# Patient Record
Sex: Female | Born: 1950 | Race: Black or African American | Hispanic: No | Smoking: Former smoker
Health system: Southern US, Community
[De-identification: ages and names within clinical notes are randomized; demographics above are authoritative.]

## PROBLEM LIST (undated history)

## (undated) DIAGNOSIS — I219 Acute myocardial infarction, unspecified: Secondary | ICD-10-CM

## (undated) DIAGNOSIS — G4730 Sleep apnea, unspecified: Secondary | ICD-10-CM

## (undated) DIAGNOSIS — H409 Unspecified glaucoma: Secondary | ICD-10-CM

## (undated) DIAGNOSIS — I2510 Atherosclerotic heart disease of native coronary artery without angina pectoris: Secondary | ICD-10-CM

## (undated) DIAGNOSIS — R12 Heartburn: Secondary | ICD-10-CM

## (undated) DIAGNOSIS — I252 Old myocardial infarction: Secondary | ICD-10-CM

## (undated) DIAGNOSIS — G4733 Obstructive sleep apnea (adult) (pediatric): Secondary | ICD-10-CM

## (undated) DIAGNOSIS — E785 Hyperlipidemia, unspecified: Secondary | ICD-10-CM

## (undated) DIAGNOSIS — R110 Nausea: Secondary | ICD-10-CM

## (undated) DIAGNOSIS — M549 Dorsalgia, unspecified: Secondary | ICD-10-CM

## (undated) DIAGNOSIS — K5900 Constipation, unspecified: Secondary | ICD-10-CM

## (undated) DIAGNOSIS — M109 Gout, unspecified: Secondary | ICD-10-CM

## (undated) DIAGNOSIS — E669 Obesity, unspecified: Secondary | ICD-10-CM

## (undated) DIAGNOSIS — E1169 Type 2 diabetes mellitus with other specified complication: Secondary | ICD-10-CM

## (undated) DIAGNOSIS — R609 Edema, unspecified: Secondary | ICD-10-CM

## (undated) DIAGNOSIS — I639 Cerebral infarction, unspecified: Secondary | ICD-10-CM

## (undated) DIAGNOSIS — R0602 Shortness of breath: Secondary | ICD-10-CM

## (undated) DIAGNOSIS — I5022 Chronic systolic (congestive) heart failure: Secondary | ICD-10-CM

## (undated) DIAGNOSIS — R45851 Suicidal ideations: Secondary | ICD-10-CM

## (undated) DIAGNOSIS — R0600 Dyspnea, unspecified: Secondary | ICD-10-CM

## (undated) DIAGNOSIS — F32A Depression, unspecified: Secondary | ICD-10-CM

## (undated) DIAGNOSIS — I447 Left bundle-branch block, unspecified: Secondary | ICD-10-CM

## (undated) DIAGNOSIS — Z95810 Presence of automatic (implantable) cardiac defibrillator: Secondary | ICD-10-CM

## (undated) DIAGNOSIS — I519 Heart disease, unspecified: Secondary | ICD-10-CM

## (undated) DIAGNOSIS — R079 Chest pain, unspecified: Secondary | ICD-10-CM

## (undated) DIAGNOSIS — D649 Anemia, unspecified: Secondary | ICD-10-CM

## (undated) DIAGNOSIS — M2550 Pain in unspecified joint: Secondary | ICD-10-CM

## (undated) DIAGNOSIS — R7303 Prediabetes: Secondary | ICD-10-CM

## (undated) DIAGNOSIS — U071 COVID-19: Secondary | ICD-10-CM

## (undated) DIAGNOSIS — I255 Ischemic cardiomyopathy: Secondary | ICD-10-CM

## (undated) DIAGNOSIS — F419 Anxiety disorder, unspecified: Secondary | ICD-10-CM

## (undated) DIAGNOSIS — R42 Dizziness and giddiness: Secondary | ICD-10-CM

## (undated) DIAGNOSIS — F329 Major depressive disorder, single episode, unspecified: Secondary | ICD-10-CM

## (undated) DIAGNOSIS — I10 Essential (primary) hypertension: Secondary | ICD-10-CM

## (undated) HISTORY — DX: Anxiety disorder, unspecified: F41.9

## (undated) HISTORY — PX: UTERINE FIBROID SURGERY: SHX826

## (undated) HISTORY — DX: Type 2 diabetes mellitus with other specified complication: E11.69

## (undated) HISTORY — DX: Ischemic cardiomyopathy: I25.5

## (undated) HISTORY — DX: Old myocardial infarction: I25.2

## (undated) HISTORY — DX: Dizziness and giddiness: R42

## (undated) HISTORY — DX: Gout, unspecified: M10.9

## (undated) HISTORY — DX: Edema, unspecified: R60.9

## (undated) HISTORY — DX: Left bundle-branch block, unspecified: I44.7

## (undated) HISTORY — DX: Unspecified glaucoma: H40.9

## (undated) HISTORY — DX: Chest pain, unspecified: R07.9

## (undated) HISTORY — DX: Shortness of breath: R06.02

## (undated) HISTORY — DX: Hyperlipidemia, unspecified: E78.5

## (undated) HISTORY — DX: Dorsalgia, unspecified: M54.9

## (undated) HISTORY — DX: Obstructive sleep apnea (adult) (pediatric): G47.33

## (undated) HISTORY — DX: Anemia, unspecified: D64.9

## (undated) HISTORY — DX: Constipation, unspecified: K59.00

## (undated) HISTORY — DX: Sleep apnea, unspecified: G47.30

## (undated) HISTORY — DX: Heartburn: R12

## (undated) HISTORY — DX: Type 2 diabetes mellitus with other specified complication: E66.9

## (undated) HISTORY — DX: Depression, unspecified: F32.A

## (undated) HISTORY — PX: TONSILLECTOMY: SUR1361

## (undated) HISTORY — PX: APPENDECTOMY: SHX54

## (undated) HISTORY — DX: Essential (primary) hypertension: I10

## (undated) HISTORY — DX: Pain in unspecified joint: M25.50

## (undated) HISTORY — DX: Heart disease, unspecified: I51.9

## (undated) HISTORY — DX: Major depressive disorder, single episode, unspecified: F32.9

## (undated) HISTORY — DX: Nausea: R11.0

## (undated) HISTORY — DX: Atherosclerotic heart disease of native coronary artery without angina pectoris: I25.10

## (undated) HISTORY — DX: Chronic systolic (congestive) heart failure: I50.22

## (undated) HISTORY — PX: SMALL BOWEL REPAIR: SHX6447

---
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
## 2008-04-27 DIAGNOSIS — D509 Iron deficiency anemia, unspecified: Secondary | ICD-10-CM

## 2008-04-27 DIAGNOSIS — K635 Polyp of colon: Secondary | ICD-10-CM | POA: Insufficient documentation

## 2008-04-27 DIAGNOSIS — Z9889 Other specified postprocedural states: Secondary | ICD-10-CM

## 2008-04-27 DIAGNOSIS — M8580 Other specified disorders of bone density and structure, unspecified site: Secondary | ICD-10-CM

## 2008-04-27 DIAGNOSIS — E7800 Pure hypercholesterolemia, unspecified: Secondary | ICD-10-CM

## 2008-04-27 DIAGNOSIS — D259 Leiomyoma of uterus, unspecified: Secondary | ICD-10-CM

## 2008-04-27 DIAGNOSIS — H409 Unspecified glaucoma: Secondary | ICD-10-CM | POA: Insufficient documentation

## 2008-04-27 DIAGNOSIS — I10 Essential (primary) hypertension: Secondary | ICD-10-CM

## 2008-04-27 DIAGNOSIS — E785 Hyperlipidemia, unspecified: Secondary | ICD-10-CM

## 2008-04-27 DIAGNOSIS — D126 Benign neoplasm of colon, unspecified: Secondary | ICD-10-CM

## 2008-04-27 DIAGNOSIS — F411 Generalized anxiety disorder: Secondary | ICD-10-CM | POA: Insufficient documentation

## 2008-04-27 DIAGNOSIS — N3289 Other specified disorders of bladder: Secondary | ICD-10-CM | POA: Insufficient documentation

## 2008-04-27 HISTORY — DX: Hyperlipidemia, unspecified: E78.5

## 2008-04-27 HISTORY — DX: Iron deficiency anemia, unspecified: D50.9

## 2008-04-27 HISTORY — DX: Generalized anxiety disorder: F41.1

## 2008-04-27 HISTORY — DX: Other specified postprocedural states: Z98.890

## 2008-04-27 HISTORY — DX: Leiomyoma of uterus, unspecified: D25.9

## 2008-04-27 HISTORY — DX: Pure hypercholesterolemia, unspecified: E78.00

## 2008-04-27 HISTORY — DX: Benign neoplasm of colon, unspecified: D12.6

## 2008-04-27 HISTORY — DX: Other specified disorders of bone density and structure, unspecified site: M85.80

## 2008-04-27 HISTORY — DX: Essential (primary) hypertension: I10

## 2009-06-16 DIAGNOSIS — M26609 Unspecified temporomandibular joint disorder, unspecified side: Secondary | ICD-10-CM

## 2009-06-16 HISTORY — DX: Unspecified temporomandibular joint disorder, unspecified side: M26.609

## 2010-09-15 DIAGNOSIS — E559 Vitamin D deficiency, unspecified: Secondary | ICD-10-CM

## 2010-09-15 HISTORY — DX: Vitamin D deficiency, unspecified: E55.9

## 2013-11-11 DIAGNOSIS — F331 Major depressive disorder, recurrent, moderate: Secondary | ICD-10-CM | POA: Diagnosis not present

## 2013-11-16 DIAGNOSIS — D649 Anemia, unspecified: Secondary | ICD-10-CM | POA: Diagnosis not present

## 2013-11-16 DIAGNOSIS — H9190 Unspecified hearing loss, unspecified ear: Secondary | ICD-10-CM | POA: Diagnosis not present

## 2013-11-16 DIAGNOSIS — F329 Major depressive disorder, single episode, unspecified: Secondary | ICD-10-CM | POA: Diagnosis not present

## 2013-11-16 DIAGNOSIS — F3289 Other specified depressive episodes: Secondary | ICD-10-CM | POA: Diagnosis not present

## 2013-11-16 DIAGNOSIS — I10 Essential (primary) hypertension: Secondary | ICD-10-CM | POA: Diagnosis not present

## 2013-12-29 DIAGNOSIS — L639 Alopecia areata, unspecified: Secondary | ICD-10-CM | POA: Diagnosis not present

## 2014-02-04 DIAGNOSIS — L639 Alopecia areata, unspecified: Secondary | ICD-10-CM | POA: Diagnosis not present

## 2014-03-03 DIAGNOSIS — F410 Panic disorder [episodic paroxysmal anxiety] without agoraphobia: Secondary | ICD-10-CM | POA: Diagnosis not present

## 2014-03-12 DIAGNOSIS — L639 Alopecia areata, unspecified: Secondary | ICD-10-CM | POA: Diagnosis not present

## 2014-04-02 DIAGNOSIS — F331 Major depressive disorder, recurrent, moderate: Secondary | ICD-10-CM | POA: Diagnosis not present

## 2014-04-02 DIAGNOSIS — F410 Panic disorder [episodic paroxysmal anxiety] without agoraphobia: Secondary | ICD-10-CM | POA: Diagnosis not present

## 2014-04-02 DIAGNOSIS — F429 Obsessive-compulsive disorder, unspecified: Secondary | ICD-10-CM | POA: Diagnosis not present

## 2014-04-02 DIAGNOSIS — F411 Generalized anxiety disorder: Secondary | ICD-10-CM | POA: Diagnosis not present

## 2014-04-09 DIAGNOSIS — L639 Alopecia areata, unspecified: Secondary | ICD-10-CM | POA: Diagnosis not present

## 2014-05-25 DIAGNOSIS — I10 Essential (primary) hypertension: Secondary | ICD-10-CM | POA: Diagnosis not present

## 2014-06-30 DIAGNOSIS — H4010X0 Unspecified open-angle glaucoma, stage unspecified: Secondary | ICD-10-CM | POA: Diagnosis not present

## 2014-07-01 DIAGNOSIS — F411 Generalized anxiety disorder: Secondary | ICD-10-CM | POA: Diagnosis not present

## 2014-07-01 DIAGNOSIS — F410 Panic disorder [episodic paroxysmal anxiety] without agoraphobia: Secondary | ICD-10-CM | POA: Diagnosis not present

## 2014-07-01 DIAGNOSIS — F429 Obsessive-compulsive disorder, unspecified: Secondary | ICD-10-CM | POA: Diagnosis not present

## 2014-07-01 DIAGNOSIS — F331 Major depressive disorder, recurrent, moderate: Secondary | ICD-10-CM | POA: Diagnosis not present

## 2014-10-13 DIAGNOSIS — Z23 Encounter for immunization: Secondary | ICD-10-CM | POA: Diagnosis not present

## 2014-10-20 DIAGNOSIS — F410 Panic disorder [episodic paroxysmal anxiety] without agoraphobia: Secondary | ICD-10-CM | POA: Diagnosis not present

## 2014-10-20 DIAGNOSIS — F42 Obsessive-compulsive disorder: Secondary | ICD-10-CM | POA: Diagnosis not present

## 2014-10-20 DIAGNOSIS — F331 Major depressive disorder, recurrent, moderate: Secondary | ICD-10-CM | POA: Diagnosis not present

## 2014-11-24 DIAGNOSIS — H40009 Preglaucoma, unspecified, unspecified eye: Secondary | ICD-10-CM | POA: Diagnosis not present

## 2014-12-02 DIAGNOSIS — H40013 Open angle with borderline findings, low risk, bilateral: Secondary | ICD-10-CM | POA: Diagnosis not present

## 2015-01-05 DIAGNOSIS — F331 Major depressive disorder, recurrent, moderate: Secondary | ICD-10-CM | POA: Diagnosis not present

## 2015-01-05 DIAGNOSIS — F42 Obsessive-compulsive disorder: Secondary | ICD-10-CM | POA: Diagnosis not present

## 2015-03-29 DIAGNOSIS — H40053 Ocular hypertension, bilateral: Secondary | ICD-10-CM | POA: Diagnosis not present

## 2015-04-06 ENCOUNTER — Encounter: Payer: Self-pay | Admitting: Licensed Clinical Social Worker

## 2015-04-07 ENCOUNTER — Ambulatory Visit: Payer: Self-pay | Admitting: Psychiatry

## 2015-04-14 ENCOUNTER — Ambulatory Visit (INDEPENDENT_AMBULATORY_CARE_PROVIDER_SITE_OTHER): Payer: Medicare Other | Admitting: Psychiatry

## 2015-04-14 ENCOUNTER — Other Ambulatory Visit: Payer: Self-pay

## 2015-04-14 ENCOUNTER — Encounter: Payer: Self-pay | Admitting: Psychiatry

## 2015-04-14 VITALS — BP 126/86 | HR 83 | Temp 98.9°F | Ht 63.0 in

## 2015-04-14 DIAGNOSIS — F429 Obsessive-compulsive disorder, unspecified: Secondary | ICD-10-CM

## 2015-04-14 DIAGNOSIS — F331 Major depressive disorder, recurrent, moderate: Secondary | ICD-10-CM

## 2015-04-14 DIAGNOSIS — F42 Obsessive-compulsive disorder: Secondary | ICD-10-CM

## 2015-04-14 MED ORDER — TRAZODONE HCL 100 MG PO TABS: 100.0000 mg | ORAL_TABLET | Freq: Every evening | ORAL | Status: DC | PRN

## 2015-04-14 MED ORDER — LORAZEPAM 0.5 MG PO TABS: 0.5000 mg | ORAL_TABLET | Freq: Four times a day (QID) | ORAL | Status: DC | PRN

## 2015-04-14 MED ORDER — VENLAFAXINE HCL ER 150 MG PO CP24: 150.0000 mg | ORAL_CAPSULE | Freq: Every day | ORAL | Status: DC

## 2015-04-14 NOTE — Progress Notes (Addendum)
BH MD/PA/NP OP Progress Note  04/14/2015 3:52 PM Melanie Ray  MRN:  [id number]  Subjective:  Patient returns for follow-up of her OCD and major depressive disorder. She states that she continues to feel stable although she does have some instances of her OCD and Tums. For example she states she continues to at times be preoccupied with contamination and engages in handwashing. However she states it is not risen to the level of interfering with her ability to enjoy life or other activities. She does state that her obsessions about contamination death. She discusses that some of the death of her sister-in-law are stirred up from her brothers new significant others. She indicated that she is going to pass on going to a concert with her brother and his new significant other because it triggers her thoughts about the funeral for her sister-in-law (i.e. Brothers late wife).   He stated that she has several activities she looks forward to such as going to a nephew's graduation in [place]. She also states that on Father's Day she will spend time with her husband and father.  She feels medications continue to be effective and denies any side effects. Chief Complaint:  Chief Complaint    Anxiety; Depression     Visit Diagnosis:     ICD-9-CM ICD-10-CM   1. OCD (obsessive compulsive disorder) 300.3 F42 LORazepam (ATIVAN) 0.5 MG tablet  2. Major depressive disorder, recurrent episode, moderate 296.32 F33.1 traZODone (DESYREL) 100 MG tablet     venlafaxine XR (EFFEXOR-XR) 150 MG 24 hr capsule    Past Medical History:  Past Medical History  Diagnosis Date  . Anxiety   . Depression   . Hypertension   . Hyperlipemia     Past Surgical History  Procedure Laterality Date  . Small bowel repair    . Appendectomy    . Tonsillectomy     Family History:  Family History  Problem Relation Age of Onset  . Anxiety disorder Mother   . Paranoid behavior Mother   . Hypertension Mother   . Hypertension  Father   . Anxiety disorder Maternal Aunt   . Drug abuse Cousin    Social History:  History   Social History  . Marital Status: Married    Spouse Name: N/A  . Number of Children: N/A  . Years of Education: N/A   Social History Main Topics  . Smoking status: Former Smoker    Types: Cigarettes    Quit date: 04/13/1997  . Smokeless tobacco: Never Used  . Alcohol Use: 0.6 oz/week    0 Standard drinks or equivalent, 1 Glasses of wine per week  . Drug Use: No  . Sexual Activity: No   Other Topics Concern  . None   Social History Narrative   Additional History:   Assessment:   Musculoskeletal: Strength & Muscle Tone: within normal limits Gait & Station: normal Patient leans: N/A  Psychiatric Specialty Exam: HPI  Review of Systems  Psychiatric/Behavioral: Negative for depression, suicidal ideas, hallucinations, memory loss and substance abuse. The patient is not nervous/anxious and does not have insomnia.     Blood pressure 126/86, pulse 83, temperature 98.9 F (37.2 C), temperature source Tympanic, height 5\' 3"  (1.6 m), SpO2 95 %.There is no weight on file to calculate BMI.  General Appearance: Neat and Well Groomed  Eye Contact:  Good  Speech:  Clear and Coherent and Normal Rate  Volume:  Normal  Mood:  Good  Affect:  Congruent  Thought Process:  Linear and Logical  Orientation:  Full (Time, Place, and Person)  Thought Content:  Negative  Suicidal Thoughts:  No  Homicidal Thoughts:  No  Memory:  Immediate;   Good Recent;   Good Remote;   Good  Judgement:  Good  Insight:  Good  Psychomotor Activity:  Negative  Concentration:  Good  Recall:  Good  Fund of Knowledge: Good  Language: Good  Akathisia:  Negative  Handed:  Right unknown  AIMS (if indicated):  Not done  Assets:  Communication Skills Desire for Improvement Leisure Time  ADL's:  Intact  Cognition: WNL  Sleep:  Good   Is the patient at risk to self?  No. Has the patient been a risk to self  in the past 6 months?  No. Has the patient been a risk to self within the distant past?  No. Is the patient a risk to others?  No. Has the patient been a risk to others in the past 6 months?  No. Has the patient been a risk to others within the distant past?  No.  Current Medications: Current Outpatient Prescriptions  Medication Sig Dispense Refill  . dorzolamide-timolol (COSOPT) 22.3-6.8 MG/ML ophthalmic solution     . lisinopril (PRINIVIL,ZESTRIL) 20 MG tablet     . LORazepam (ATIVAN) 0.5 MG tablet Take 1 tablet (0.5 mg total) by mouth 4 (four) times daily as needed for anxiety. 120 tablet 4  . MULTIPLE VITAMIN PO     . traZODone (DESYREL) 100 MG tablet Take 1 tablet (100 mg total) by mouth at bedtime as needed for sleep. 30 tablet 3  . venlafaxine XR (EFFEXOR-XR) 150 MG 24 hr capsule Take 1 capsule (150 mg total) by mouth daily. 30 capsule 3   No current facility-administered medications for this visit.    Medical Decision Making:  Established Problem, Stable/Improving (1)  Treatment Plan Summary:Medication management We will continue her medications without any changes. She will continue Effexor XR 150 mg daily, trazodone 100 mg at bedtime as needed and Ativan 0.5 mg 4 times a day as needed for anxiety. Patient will follow up in 3 months. She's been encouraged: Questions or concerns prior to her next appointment.  During appointment patient had asked about primary care physicians. I indicated I did not know specifics about any particular primary care physician. I spoke with another psychiatrist to has worked in the area for a while. I passed onto patient on 04/18/2015 that there were the [facility] and with our [facility]. I also indicated I was given the names of a Dr. Darrick Meigs and Dr. Venia Minks. I told patient I did not have first experience with them but I was basing this on another doctor who was familiar with groups and doctors in the area. Patient accepted information and  was thankful.  Faith Rogue 04/14/2015, 3:52 PM

## 2015-05-10 ENCOUNTER — Other Ambulatory Visit: Payer: Self-pay

## 2015-05-10 DIAGNOSIS — F429 Obsessive-compulsive disorder, unspecified: Secondary | ICD-10-CM

## 2015-05-10 NOTE — Telephone Encounter (Signed)
This is a dr. Gwyndolyn Saxon pt.  On the last visit dr.williams increased medication from 3 times a day to 4 times a day but the  insurance will not cover for the ativan 4 times a day.    pt was rx sent for ativan 3 times a day.

## 2015-05-10 NOTE — Telephone Encounter (Signed)
pt called back states that she has enought medication to do until dr. Jimmye Norman comes back from vacation next week

## 2015-05-16 MED ORDER — LORAZEPAM 0.5 MG PO TABS: 0.5000 mg | ORAL_TABLET | Freq: Three times a day (TID) | ORAL | Status: DC

## 2015-05-16 NOTE — Telephone Encounter (Signed)
rx faxed to [place] notified patient.

## 2015-05-16 NOTE — Telephone Encounter (Signed)
rx faxed to [facility].  

## 2015-06-27 DIAGNOSIS — D499 Neoplasm of unspecified behavior of unspecified site: Secondary | ICD-10-CM | POA: Diagnosis not present

## 2015-06-27 DIAGNOSIS — B079 Viral wart, unspecified: Secondary | ICD-10-CM | POA: Diagnosis not present

## 2015-06-27 DIAGNOSIS — B078 Other viral warts: Secondary | ICD-10-CM | POA: Diagnosis not present

## 2015-07-05 DIAGNOSIS — H40003 Preglaucoma, unspecified, bilateral: Secondary | ICD-10-CM | POA: Diagnosis not present

## 2015-07-15 ENCOUNTER — Ambulatory Visit: Payer: Medicare Other | Admitting: Psychiatry

## 2015-07-28 ENCOUNTER — Ambulatory Visit (INDEPENDENT_AMBULATORY_CARE_PROVIDER_SITE_OTHER): Payer: Medicare Other | Admitting: Family Medicine

## 2015-07-28 ENCOUNTER — Telehealth: Payer: Self-pay | Admitting: *Deleted

## 2015-07-28 ENCOUNTER — Encounter: Payer: Self-pay | Admitting: Family Medicine

## 2015-07-28 ENCOUNTER — Encounter (INDEPENDENT_AMBULATORY_CARE_PROVIDER_SITE_OTHER): Payer: Self-pay

## 2015-07-28 VITALS — BP 130/84 | HR 82 | Temp 98.5°F | Ht 63.25 in | Wt 183.5 lb

## 2015-07-28 DIAGNOSIS — Z23 Encounter for immunization: Secondary | ICD-10-CM

## 2015-07-28 DIAGNOSIS — G4700 Insomnia, unspecified: Secondary | ICD-10-CM

## 2015-07-28 DIAGNOSIS — R42 Dizziness and giddiness: Secondary | ICD-10-CM | POA: Diagnosis not present

## 2015-07-28 DIAGNOSIS — Z833 Family history of diabetes mellitus: Secondary | ICD-10-CM

## 2015-07-28 DIAGNOSIS — F329 Major depressive disorder, single episode, unspecified: Secondary | ICD-10-CM | POA: Insufficient documentation

## 2015-07-28 DIAGNOSIS — F419 Anxiety disorder, unspecified: Secondary | ICD-10-CM | POA: Insufficient documentation

## 2015-07-28 DIAGNOSIS — E785 Hyperlipidemia, unspecified: Secondary | ICD-10-CM | POA: Diagnosis not present

## 2015-07-28 DIAGNOSIS — E663 Overweight: Secondary | ICD-10-CM | POA: Insufficient documentation

## 2015-07-28 DIAGNOSIS — E669 Obesity, unspecified: Secondary | ICD-10-CM | POA: Diagnosis not present

## 2015-07-28 DIAGNOSIS — I10 Essential (primary) hypertension: Secondary | ICD-10-CM | POA: Diagnosis not present

## 2015-07-28 DIAGNOSIS — H409 Unspecified glaucoma: Secondary | ICD-10-CM

## 2015-07-28 DIAGNOSIS — F32A Depression, unspecified: Secondary | ICD-10-CM | POA: Insufficient documentation

## 2015-07-28 HISTORY — DX: Hyperlipidemia, unspecified: E78.5

## 2015-07-28 HISTORY — DX: Insomnia, unspecified: G47.00

## 2015-07-28 HISTORY — DX: Major depressive disorder, single episode, unspecified: F32.9

## 2015-07-28 LAB — COMPREHENSIVE METABOLIC PANEL
ALBUMIN: 4.3 g/dL (ref 3.5–5.2)
ALK PHOS: 115 U/L (ref 39–117)
ALT: 22 U/L (ref 0–35)
AST: 20 U/L (ref 0–37)
BUN: 21 mg/dL (ref 6–23)
CO2: 29 mEq/L (ref 19–32)
CREATININE: 0.88 mg/dL (ref 0.40–1.20)
Calcium: 10.3 mg/dL (ref 8.4–10.5)
Chloride: 103 mEq/L (ref 96–112)
GFR: 82.87 mL/min (ref >60.00)
GLUCOSE: 85 mg/dL (ref 70–99)
Potassium: 4.4 mEq/L (ref 3.5–5.1)
SODIUM: 138 meq/L (ref 135–145)
TOTAL PROTEIN: 8 g/dL (ref 6.0–8.3)
Total Bilirubin: 0.3 mg/dL (ref 0.2–1.2)

## 2015-07-28 LAB — LIPID PANEL
CHOL/HDL RATIO: 7
Cholesterol: 246 mg/dL — ABNORMAL HIGH (ref 0–200)
HDL: 37.6 mg/dL — ABNORMAL LOW (ref >39.00)
LDL Cholesterol: 173 mg/dL — ABNORMAL HIGH (ref 0–99)
NONHDL: 207.94
Triglycerides: 177 mg/dL — ABNORMAL HIGH (ref 0.0–149.0)
VLDL: 35.4 mg/dL (ref 0.0–40.0)

## 2015-07-28 LAB — CBC
HCT: 40.3 % (ref 36.0–46.0)
Hemoglobin: 13.2 g/dL (ref 12.0–15.0)
MCHC: 32.8 g/dL (ref 30.0–36.0)
MCV: 81.4 fl (ref 78.0–100.0)
Platelets: 390 10*3/uL (ref 150.0–400.0)
RBC: 4.95 Mil/uL (ref 3.87–5.11)
RDW: 13.9 % (ref 11.5–15.5)
WBC: 8.7 10*3/uL (ref 4.0–10.5)

## 2015-07-28 LAB — HEMOGLOBIN A1C: HEMOGLOBIN A1C: 6.8 % — AB (ref 4.6–6.5)

## 2015-07-28 NOTE — Telephone Encounter (Signed)
Pt called states she failed to request Lisinopril Rx during her NP appoint on 9.21.16.  Please advise

## 2015-07-28 NOTE — Progress Notes (Signed)
Pre visit review using our clinic review tool, if applicable. No additional management support is needed unless otherwise documented below in the visit note. 

## 2015-07-28 NOTE — Assessment & Plan Note (Signed)
Stable

## 2015-07-28 NOTE — Assessment & Plan Note (Signed)
Unclear of control. Will obtain records. Additionally, will obtain lipid panel today.

## 2015-07-28 NOTE — Assessment & Plan Note (Signed)
Unclear etiology with broad differential. Does not appear to be cardiac in nature but this is always a possibility. Offered EKG but patient declined due to insurance issues. Obtaining lab work today: CBC, CMP, A1c. Orthostatics negative. Will continue to follow closely.

## 2015-07-28 NOTE — Assessment & Plan Note (Signed)
Well controlled. Continue ACEI. Metabolic panel today.

## 2015-07-28 NOTE — Assessment & Plan Note (Signed)
Stable. Continue prn trazodone 

## 2015-07-28 NOTE — Assessment & Plan Note (Signed)
Stable. Will continue Effexor.

## 2015-07-28 NOTE — Patient Instructions (Signed)
It was nice to see you today.  Follow up with me in the next 2-3 months.  We will call with your lab results.   Take care  Dr. Lacinda Axon

## 2015-07-28 NOTE — Assessment & Plan Note (Signed)
Well-controlled. Continue PRN Ativan.

## 2015-07-28 NOTE — Progress Notes (Signed)
Subjective:  Patient ID: Melanie Ray, female    DOB: 1951/05/30  Age: 64 y.o. MRN: [id number]  CC: Establish care, dizziness  HPI Melanie Ray is a 64 y.o. female presents to the clinic today to establish care.  She has complaints of dizziness.  Dizziness  Has been going on for 1-1/2 years.  She states that in the mornings between 9 and 12 she typically has an episode of dizziness. She states it is characterized by feeling faint and is associated with blurry vision and nausea. She states that she does feel like she is going to pass out.  She reports that last anywhere from 5-20 minutes. She states that it typically improves following PO intake (food, water).   No known exacerbating factors.  She does not have chest pain or palpitations with these. She does note that she has some shortness of breath with it.  She states that it appears to be different than her underlying anxiety.  No association with exertion.  HTN  Well controlled on Lisinopril.  In need of labs.  Depression  Well controlled on Effexor.  Anxiety  Stable. Requires minimal use of Ativan when anxious.  Glaucoma  Followed by [facility].  Stable at this time.  Insomnia  Doing well currently.  Uses intermittent Trazodone.  Preventative Healthcare  Pap smear: No prior positives; Last pap smear was ~ 5 years ago.  Mammogram: Unsure of last mammogram.  Colonoscopy: >10 years ago.  Immunizations: Declines Tdap, Pneumovax, Zostavax. Would like a flu shot today.  Labs: In need of CBC, CMP, Lipid, A1C  Alcohol use: See below.  Smoking/tobacco use: Former smoker.  HIV testing: declines.   PMH, Surgical Hx, Family Hx, Social History reviewed and updated as below. Past Medical History  Diagnosis Date  . Anxiety   . Depression   . Hypertension   . Hyperlipemia   . Glaucoma    Past Surgical History  Procedure Laterality Date  . Small bowel repair    . Appendectomy    .  Tonsillectomy     Family History  Problem Relation Age of Onset  . Anxiety disorder Mother   . Paranoid behavior Mother   . Hypertension Mother   . Hypertension Father   . Stroke Father   . Anxiety disorder Maternal Aunt   . Drug abuse Cousin   . Tuberculosis Paternal Grandfather    Social History  Substance Use Topics  . Smoking status: Former Smoker    Types: Cigarettes    Quit date: 04/13/1997  . Smokeless tobacco: Never Used  . Alcohol Use: 0.6 oz/week    1 Glasses of wine, 0 Standard drinks or equivalent per week   Review of Systems  Respiratory: Positive for cough.   Cardiovascular: Positive for leg swelling.  Genitourinary: Positive for frequency.       Incontinence.  Skin:       Wart removed recently.  Neurological: Positive for dizziness and light-headedness.       Memory difficulties.  All other systems negative.  Objective:   Today's Vitals: BP 130/84 mmHg  Pulse 82  Temp(Src) 98.5 F (36.9 C) (Oral)  Ht 5' 3.25" (1.607 m)  Wt 183 lb 8 oz (83.235 kg)  BMI 32.23 kg/m2  SpO2 93%  Physical Exam  Constitutional: She is oriented to person, place, and time. She appears well-developed and well-nourished. No distress.  HENT:  Head: Normocephalic and atraumatic.  Nose: Nose normal.  Mouth/Throat: Oropharynx is clear and moist. No oropharyngeal  exudate.  Normal TM's bilaterally.   Eyes: Conjunctivae and EOM are normal. Pupils are equal, round, and reactive to light. No scleral icterus.  Neck: Neck supple.  Cardiovascular: Normal rate and regular rhythm.   No murmur heard. Pulmonary/Chest: Effort normal and breath sounds normal. She has no wheezes. She has no rales.  Abdominal: Soft. She exhibits no distension. There is no tenderness. There is no rebound and no guarding.  Musculoskeletal: Normal range of motion. She exhibits no edema.  Lymphadenopathy:    She has no cervical adenopathy.  Neurological: She is alert and oriented to person, place, and time.    Skin: Skin is warm and dry. No rash noted.  Psychiatric: She has a normal mood and affect.  Vitals reviewed.  Assessment & Plan:   Problem List Items Addressed This Visit    Essential hypertension - Primary    Well controlled. Continue ACEI. Metabolic panel today.      Relevant Orders   CBC   Comp Met (CMET)   Hemoglobin A1c   HLD (hyperlipidemia)    Unclear of control. Will obtain records. Additionally, will obtain lipid panel today.      Glaucoma    Stable.      Depression    Stable. Will continue Effexor.       Anxiety    Well-controlled. Continue PRN Ativan.      Insomnia    Stable. Continue prn trazodone.      Obesity (BMI 30.0-34.9)   Relevant Orders   Hemoglobin A1c   Dizziness    Unclear etiology with broad differential. Does not appear to be cardiac in nature but this is always a possibility. Offered EKG but patient declined due to insurance issues. Obtaining lab work today: CBC, CMP, A1c. Orthostatics negative. Will continue to follow closely.      Relevant Orders   CBC   Hemoglobin A1c    Other Visit Diagnoses    Hyperlipidemia        Relevant Orders    Lipid panel    Family history of diabetes mellitus        Relevant Orders    Hemoglobin A1c    Encounter for immunization           Outpatient Encounter Prescriptions as of 07/28/2015  Medication Sig  . APPLE CIDER VINEGAR PO Take by mouth.  . dorzolamide-timolol (COSOPT) 22.3-6.8 MG/ML ophthalmic solution   . lisinopril (PRINIVIL,ZESTRIL) 20 MG tablet   . LORazepam (ATIVAN) 0.5 MG tablet Take 1 tablet (0.5 mg total) by mouth 3 (three) times daily.  . MULTIPLE VITAMIN PO   . traZODone (DESYREL) 100 MG tablet Take 1 tablet (100 mg total) by mouth at bedtime as needed for sleep.  [name] venlafaxine XR (EFFEXOR-XR) 150 MG 24 hr capsule Take 1 capsule (150 mg total) by mouth daily.   No facility-administered encounter medications on file as of 07/28/2015.   Follow-up: 2-3 months.  Coral Spikes DO

## 2015-07-29 ENCOUNTER — Other Ambulatory Visit: Payer: Self-pay | Admitting: Family Medicine

## 2015-07-29 MED ORDER — LISINOPRIL 20 MG PO TABS: 20.0000 mg | ORAL_TABLET | Freq: Every day | ORAL | Status: DC

## 2015-07-29 NOTE — Telephone Encounter (Signed)
Rx sent 

## 2015-07-29 NOTE — Telephone Encounter (Signed)
Tried to call pt to let her know that her rx had been sent.

## 2015-08-03 ENCOUNTER — Ambulatory Visit (INDEPENDENT_AMBULATORY_CARE_PROVIDER_SITE_OTHER): Payer: Medicare Other | Admitting: Family Medicine

## 2015-08-03 ENCOUNTER — Encounter: Payer: Self-pay | Admitting: Family Medicine

## 2015-08-03 VITALS — BP 164/98 | HR 87 | Temp 98.9°F | Ht 63.25 in | Wt 183.5 lb

## 2015-08-03 DIAGNOSIS — E119 Type 2 diabetes mellitus without complications: Secondary | ICD-10-CM | POA: Diagnosis not present

## 2015-08-03 DIAGNOSIS — E785 Hyperlipidemia, unspecified: Secondary | ICD-10-CM | POA: Diagnosis not present

## 2015-08-03 DIAGNOSIS — R7303 Prediabetes: Secondary | ICD-10-CM | POA: Insufficient documentation

## 2015-08-03 HISTORY — DX: Type 2 diabetes mellitus without complications: E11.9

## 2015-08-03 MED ORDER — ATORVASTATIN CALCIUM 40 MG PO TABS: 40.0000 mg | ORAL_TABLET | Freq: Every day | ORAL | Status: DC

## 2015-08-03 MED ORDER — METFORMIN HCL 500 MG PO TABS: 500.0000 mg | ORAL_TABLET | Freq: Two times a day (BID) | ORAL | Status: DC

## 2015-08-03 NOTE — Progress Notes (Signed)
Subjective:  Patient ID: Melanie Ray, female    DOB: 05-13-1951  Age: 64 y.o. MRN: [id number]  CC: Follow up; recently found to be diabetic, lipids uncontrolled.  HPI:  64 year old female with HTN and HLD presents for follow up.  DM-2  Recent labs revealed A1C of 6.8.  Patient presents today to discuss new diagnosis and treatment options.  She is feeling okay today.  No recent polyuria, polydipsia, weight loss.   HLD Lipid Panel     Component Value Date/Time   CHOL 246* 07/28/2015 1007   TRIG 177.0* 07/28/2015 1007   HDL 37.60* 07/28/2015 1007   CHOLHDL 7 07/28/2015 1007   VLDL 35.4 07/28/2015 1007   LDLCALC 173* 07/28/2015 1007    Recent labs are above.  Cholesterol uncontrolled.  ASCVD risk score - 27.4% 10 year risk.  She is on no treatment at this time.  Social Hx   Social History   Social History  . Marital Status: Married    Spouse Name: N/A  . Number of Children: N/A  . Years of Education: N/A   Social History Main Topics  . Smoking status: Former Smoker    Types: Cigarettes    Quit date: 04/13/1997  . Smokeless tobacco: Never Used  . Alcohol Use: 0.6 oz/week    1 Glasses of wine, 0 Standard drinks or equivalent per week  . Drug Use: No  . Sexual Activity: No   Other Topics Concern  . None   Social History Narrative   Review of Systems  Constitutional: Negative.   Respiratory: Positive for cough.   Endocrine: Negative.    Objective:  BP 164/98 mmHg  Pulse 87  Temp(Src) 98.9 F (37.2 C) (Oral)  Ht 5' 3.25" (1.607 m)  Wt 183 lb 8 oz (83.235 kg)  BMI 32.23 kg/m2  SpO2 93%  BP/Weight 08/03/2015 03/09/6978 02/11/164  Systolic BP 537 482 707  Diastolic BP 98 84 86  Wt. (Lbs) 183.5 183.5 -  BMI 32.23 32.23 -   Physical Exam  Constitutional: She is oriented to person, place, and time. She appears well-developed and well-nourished. No distress.  Cardiovascular: Normal rate and regular rhythm.   No murmur heard. Pulmonary/Chest:  Effort normal and breath sounds normal. No respiratory distress. She has no wheezes. She has no rales.  Abdominal: Soft. She exhibits no distension. There is no tenderness.  Neurological: She is alert and oriented to person, place, and time.  Psychiatric: She has a normal mood and affect.  Vitals reviewed.  Lab Results  Component Value Date   WBC 8.7 07/28/2015   HGB 13.2 07/28/2015   HCT 40.3 07/28/2015   PLT 390.0 07/28/2015   GLUCOSE 85 07/28/2015   CHOL 246* 07/28/2015   TRIG 177.0* 07/28/2015   HDL 37.60* 07/28/2015   LDLCALC 173* 07/28/2015   ALT 22 07/28/2015   AST 20 07/28/2015   NA 138 07/28/2015   K 4.4 07/28/2015   CL 103 07/28/2015   CREATININE 0.88 07/28/2015   BUN 21 07/28/2015   CO2 29 07/28/2015   HGBA1C 6.8* 07/28/2015    Assessment & Plan:   Problem List Items Addressed This Visit    HLD (hyperlipidemia) - Primary    Established problem, Uncontrolled. ASCVD risk score (10 year) = 27.4% Starting Lipitor 40 mg daily (high intensity statin). Will plan to recheck at follow up in Dec.      Relevant Medications   atorvastatin (LIPITOR) 40 MG tablet   DM type 2 (diabetes  mellitus, type 2)    New problem. Starting Metformin today. Follow up in December. Will address preventative care then as there is currently issues regarding her insurance/payment.      Relevant Medications   atorvastatin (LIPITOR) 40 MG tablet   metFORMIN (GLUCOPHAGE) 500 MG tablet      Meds ordered this encounter  Medications  . atorvastatin (LIPITOR) 40 MG tablet    Sig: Take 1 tablet (40 mg total) by mouth daily.    Dispense:  90 tablet    Refill:  3  . metFORMIN (GLUCOPHAGE) 500 MG tablet    Sig: Take 1 tablet (500 mg total) by mouth 2 (two) times daily with a meal.    Dispense:  180 tablet    Refill:  3    Follow-up: As scheduled previously (December).  Thersa Salt, DO

## 2015-08-03 NOTE — Patient Instructions (Signed)
It was nice to see you.  Take the Lipitor (Atorvastatin) daily as prescribed.  Take the metformin twice daily.  After 1 week you can increase it to 1000 mg (2 tablets) in the morning and 1 tablet (500 mg) at night.  Follow up in Dec. As scheduled.  Take care  Dr. Lacinda Axon

## 2015-08-03 NOTE — Assessment & Plan Note (Signed)
New problem. Starting Metformin today. Follow up in December. Will address preventative care then as there is currently issues regarding her insurance/payment.

## 2015-08-03 NOTE — Progress Notes (Signed)
Pre visit review using our clinic review tool, if applicable. No additional management support is needed unless otherwise documented below in the visit note. 

## 2015-08-03 NOTE — Assessment & Plan Note (Addendum)
Established problem, Uncontrolled. ASCVD risk score (10 year) = 27.4% Starting Lipitor 40 mg daily (high intensity statin). Will plan to recheck at follow up in Dec.

## 2015-08-05 ENCOUNTER — Ambulatory Visit: Payer: Medicare Other | Admitting: Family Medicine

## 2015-08-08 ENCOUNTER — Telehealth: Payer: Self-pay

## 2015-08-08 ENCOUNTER — Other Ambulatory Visit: Payer: Self-pay | Admitting: Family Medicine

## 2015-08-08 MED ORDER — BENZONATATE 100 MG PO CAPS: 100.0000 mg | ORAL_CAPSULE | Freq: Three times a day (TID) | ORAL | Status: DC | PRN

## 2015-08-08 NOTE — Telephone Encounter (Signed)
I received a phone message from patient. She is wondering how much sugar and carbs she should consume on a daily basis. Patient also wants to know if there is a cough medication that can be prescribed. Patient states that she has tried everything OTC for a cough, and nothing seems to be helping. Please advise.

## 2015-08-08 NOTE — Telephone Encounter (Signed)
She just needs to watch carb intake. No particular number. Tessalon sent in for cough.

## 2015-08-08 NOTE — Telephone Encounter (Signed)
lvtcb with her husband

## 2015-08-26 ENCOUNTER — Other Ambulatory Visit: Payer: Self-pay | Admitting: Psychiatry

## 2015-09-13 ENCOUNTER — Ambulatory Visit (INDEPENDENT_AMBULATORY_CARE_PROVIDER_SITE_OTHER): Payer: Medicare Other | Admitting: Psychiatry

## 2015-09-13 ENCOUNTER — Encounter: Payer: Self-pay | Admitting: Psychiatry

## 2015-09-13 VITALS — BP 136/78 | HR 96 | Temp 97.0°F | Ht 63.0 in | Wt 181.4 lb

## 2015-09-13 DIAGNOSIS — F331 Major depressive disorder, recurrent, moderate: Secondary | ICD-10-CM | POA: Diagnosis not present

## 2015-09-13 DIAGNOSIS — F429 Obsessive-compulsive disorder, unspecified: Secondary | ICD-10-CM

## 2015-09-13 MED ORDER — LORAZEPAM 0.5 MG PO TABS: 0.5000 mg | ORAL_TABLET | Freq: Three times a day (TID) | ORAL | Status: DC

## 2015-09-13 MED ORDER — VENLAFAXINE HCL ER 150 MG PO CP24: 150.0000 mg | ORAL_CAPSULE | Freq: Every day | ORAL | Status: DC

## 2015-09-13 MED ORDER — TRAZODONE HCL 100 MG PO TABS: 100.0000 mg | ORAL_TABLET | Freq: Every evening | ORAL | Status: DC | PRN

## 2015-09-13 NOTE — Progress Notes (Signed)
[place] MD/PA/NP OP Progress Note  09/13/2015 4:52 PM Melanie Ray  MRN:  [id number]  Subjective:  Patient returns for follow-up of her OCD and major depressive disorder. Patient stated overall she feels stable on her medication. Today she discussed perhaps her biggest stressor is caring for her husband who has dementia. She discussed how she feels somewhat like her life is not for filled because he is not able to function like a true affectionate partner. She states otherwise she is active in various activities. She states that sometimes she does have her anxiety and she'll use her Ativan 2 or 3 times a week.  In [place] that since her last visit she was diagnosed with diabetes and she is now taking oral medication for that. She discussed [name] sometimes she wants to eat or carbohydrates and indicated that sometimes she might not follow her regimen and consumes sweets. She indicated that son she will wonders why she is willing to eat sweets even though she knows it's not good for her health. She proposed the connection is that since she is not having a fulfilled relationship such as with her husband she may not be as enthusiastic about life. She adamantly denies it's risen to the level of suicidal ideation but perhaps she describes more of an indifference to her current situation. Chief Complaint: Lack of fulfillment Chief Complaint    Follow-up; Medication Refill     Visit Diagnosis:     ICD-9-CM ICD-10-CM   1. OCD (obsessive compulsive disorder) 300.3 F42.9 LORazepam (ATIVAN) 0.5 MG tablet  2. Major depressive disorder, recurrent episode, moderate (HCC) 296.32 F33.1 traZODone (DESYREL) 100 MG tablet    Past Medical History:  Past Medical History  Diagnosis Date  . Anxiety   . Depression   . Hypertension   . Hyperlipemia   . Glaucoma     Past Surgical History  Procedure Laterality Date  . Small bowel repair    . Appendectomy    . Tonsillectomy     Family History:  Family History   Problem Relation Age of Onset  . Anxiety disorder Mother   . Paranoid behavior Mother   . Hypertension Mother   . Heart failure Mother   . Stroke Mother   . Dementia Mother   . Hypertension Father   . Anxiety disorder Maternal Aunt   . Drug abuse Cousin   . Tuberculosis Paternal Grandfather   . Mood Disorder Sister   . Stroke Sister    Social History:  Social History   Social History  . Marital Status: Married    Spouse Name: N/A  . Number of Children: N/A  . Years of Education: N/A   Social History Main Topics  . Smoking status: Former Smoker    Types: Cigarettes    Quit date: 04/13/1997  . Smokeless tobacco: Never Used  . Alcohol Use: 0.6 oz/week    1 Glasses of wine, 0 Standard drinks or equivalent, 0 Cans of beer, 0 Shots of liquor per week  . Drug Use: No  . Sexual Activity: No   Other Topics Concern  . None   Social History Narrative   Additional History:   Assessment:   Musculoskeletal: Strength & Muscle Tone: within normal limits Gait & Station: normal Patient leans: N/A  Psychiatric Specialty Exam: HPI  Review of Systems  Psychiatric/Behavioral: Negative for depression, suicidal ideas, hallucinations, memory loss and substance abuse. The patient is nervous/anxious. The patient does not have insomnia.   All other systems reviewed and  are negative.   Blood pressure 136/78, pulse 96, temperature 97 F (36.1 C), temperature source Tympanic, height 5\' 3"  (1.6 m), weight 181 lb 6.4 oz (82.283 kg), SpO2 98 %.Body mass index is 32.14 kg/(m^2).  General Appearance: Neat and Well Groomed  Eye Contact:  Good  Speech:  Clear and Coherent and Normal Rate  Volume:  Normal  Mood:  Good  Affect:  Congruent  Thought Process:  Linear and Logical  Orientation:  Full (Time, Place, and Person)  Thought Content:  Negative  Suicidal Thoughts:  No  Homicidal Thoughts:  No  Memory:  Immediate;   Good Recent;   Good Remote;   Good  Judgement:  Good  Insight:   Good  Psychomotor Activity:  Negative  Concentration:  Good  Recall:  Good  Fund of Knowledge: Good  Language: Good  Akathisia:  Negative  Handed:  Right unknown  AIMS (if indicated):  Not done  Assets:  Communication Skills Desire for Improvement Leisure Time  ADL's:  Intact  Cognition: WNL  Sleep:  Good   Is the patient at risk to self?  No. Has the patient been a risk to self in the past 6 months?  No. Has the patient been a risk to self within the distant past?  No. Is the patient a risk to others?  No. Has the patient been a risk to others in the past 6 months?  No. Has the patient been a risk to others within the distant past?  No.  Current Medications: Current Outpatient Prescriptions  Medication Sig Dispense Refill  . APPLE CIDER VINEGAR PO Take by mouth.    [name] atorvastatin (LIPITOR) 40 MG tablet Take 1 tablet (40 mg total) by mouth daily. 90 tablet 3  . dorzolamide-timolol (COSOPT) 22.3-6.8 MG/ML ophthalmic solution     . lisinopril (PRINIVIL,ZESTRIL) 20 MG tablet Take 1 tablet (20 mg total) by mouth daily. 90 tablet 3  . LORazepam (ATIVAN) 0.5 MG tablet Take 1 tablet (0.5 mg total) by mouth 3 (three) times daily. 90 tablet 4  . metFORMIN (GLUCOPHAGE) 500 MG tablet Take 1 tablet (500 mg total) by mouth 2 (two) times daily with a meal. 180 tablet 3  . MULTIPLE VITAMIN PO     . traZODone (DESYREL) 100 MG tablet Take 1 tablet (100 mg total) by mouth at bedtime as needed for sleep. 30 tablet 4  . venlafaxine XR (EFFEXOR-XR) 150 MG 24 hr capsule Take 1 capsule (150 mg total) by mouth daily. 30 capsule 4  . benzonatate (TESSALON) 100 MG capsule Take 1 capsule (100 mg total) by mouth 3 (three) times daily as needed for cough. (Patient not taking: Reported on 09/13/2015) 30 capsule 0   No current facility-administered medications for this visit.    Medical Decision Making:  Established Problem, Stable/Improving (1)  Treatment Plan Summary:Medication management   OCD-We will  continue her medications without any changes. She will continue Effexor XR 150 mg daily, trazodone 100 mg at bedtime as needed and Ativan 0.5 mg 4 times a day as needed for anxiety. Patient will follow up in 3 months. She's been encouraged: Questions or concerns prior to her next appointment.    Faith Rogue 09/13/2015, 4:52 PM

## 2015-10-28 ENCOUNTER — Ambulatory Visit: Payer: Medicare Other | Admitting: Family Medicine

## 2015-12-02 DIAGNOSIS — H40003 Preglaucoma, unspecified, bilateral: Secondary | ICD-10-CM | POA: Diagnosis not present

## 2015-12-16 DIAGNOSIS — H40003 Preglaucoma, unspecified, bilateral: Secondary | ICD-10-CM | POA: Diagnosis not present

## 2015-12-16 LAB — HM DIABETES EYE EXAM

## 2015-12-21 ENCOUNTER — Encounter: Payer: Self-pay | Admitting: Family Medicine

## 2016-01-17 ENCOUNTER — Ambulatory Visit (INDEPENDENT_AMBULATORY_CARE_PROVIDER_SITE_OTHER): Payer: Medicare Other | Admitting: Psychiatry

## 2016-01-17 ENCOUNTER — Encounter: Payer: Self-pay | Admitting: Psychiatry

## 2016-01-17 VITALS — BP 138/82 | HR 98 | Temp 97.1°F | Ht 63.0 in | Wt 178.4 lb

## 2016-01-17 DIAGNOSIS — F331 Major depressive disorder, recurrent, moderate: Secondary | ICD-10-CM | POA: Diagnosis not present

## 2016-01-17 DIAGNOSIS — F429 Obsessive-compulsive disorder, unspecified: Secondary | ICD-10-CM | POA: Diagnosis not present

## 2016-01-17 MED ORDER — LORAZEPAM 0.5 MG PO TABS: 0.5000 mg | ORAL_TABLET | Freq: Two times a day (BID) | ORAL | Status: DC | PRN

## 2016-01-17 MED ORDER — TRAZODONE HCL 100 MG PO TABS: 100.0000 mg | ORAL_TABLET | Freq: Every evening | ORAL | Status: DC | PRN

## 2016-01-17 MED ORDER — VENLAFAXINE HCL ER 150 MG PO CP24: 150.0000 mg | ORAL_CAPSULE | Freq: Every day | ORAL | Status: DC

## 2016-01-17 NOTE — Progress Notes (Signed)
Patient ID: Melanie Ray, female   DOB: Aug 01, 1951, 65 y.o.   MRN: [id number] [facility] MD/PA/NP OP Progress Note  01/17/2016 3:06 PM Rilea Yearby  MRN:  [id number]  Subjective:  Patient returns for follow-up of her OCD and major depressive disorder. She was previously seen by Dr.Williams. This is the first visit for this patient with this clinician. Patient stated overall she feels stable on her medication. She described her severe OCD symptoms that started in 2011, says they are much better now. States most of her thoughts revolved around death and when her sister in law died, she had a lot of difficulty managing her symptoms. Currently reports being overall stable. States that she takes the Ativan about 2 times daily. Discussed with her the risks of excessive sedation , dizziness and memory impairment that increase with age as well. Patient states that she will try to keep it to twice daily. States that she is enjoying her life otherwise.  Chief Complaint: Doing okay Chief Complaint    Follow-up; Medication Refill     Visit Diagnosis:   No diagnosis found.  Past Medical History:  Past Medical History  Diagnosis Date  . Anxiety   . Depression   . Hypertension   . Hyperlipemia   . Glaucoma     Past Surgical History  Procedure Laterality Date  . Small bowel repair    . Appendectomy    . Tonsillectomy     Family History:  Family History  Problem Relation Age of Onset  . Anxiety disorder Mother   . Paranoid behavior Mother   . Hypertension Mother   . Heart failure Mother   . Stroke Mother   . Dementia Mother   . Hypertension Father   . Anxiety disorder Maternal Aunt   . Drug abuse Cousin   . Tuberculosis Paternal Grandfather   . Mood Disorder Sister   . Stroke Sister    Social History:  Social History   Social History  . Marital Status: Married    Spouse Name: N/A  . Number of Children: N/A  . Years of Education: N/A   Social History Main Topics  . Smoking  status: Former Smoker    Types: Cigarettes    Quit date: 04/13/1997  . Smokeless tobacco: Never Used  . Alcohol Use: 0.6 oz/week    1 Glasses of wine, 0 Standard drinks or equivalent, 0 Cans of beer, 0 Shots of liquor per week  . Drug Use: No  . Sexual Activity: No   Other Topics Concern  . None   Social History Narrative   Additional History:   Assessment:   Musculoskeletal: Strength & Muscle Tone: within normal limits Gait & Station: normal Patient leans: N/A  Psychiatric Specialty Exam: HPI  Review of Systems  Psychiatric/Behavioral: Negative for depression, suicidal ideas, hallucinations, memory loss and substance abuse. The patient is nervous/anxious. The patient does not have insomnia.   All other systems reviewed and are negative.   Blood pressure 138/82, pulse 98, temperature 97.1 F (36.2 C), temperature source Tympanic, height 5\' 3"  (1.6 m), weight 178 lb 6.4 oz (80.922 kg), SpO2 95 %.Body mass index is 31.61 kg/(m^2).  General Appearance: Neat and Well Groomed  Eye Contact:  Good  Speech:  Clear and Coherent and Normal Rate  Volume:  Normal  Mood:  Good  Affect:  Congruent  Thought Process:  Linear and Logical  Orientation:  Full (Time, Place, and Person)  Thought Content:  Negative  Suicidal Thoughts:  No  Homicidal Thoughts:  No  Memory:  Immediate;   Good Recent;   Good Remote;   Good  Judgement:  Good  Insight:  Good  Psychomotor Activity:  Negative  Concentration:  Good  Recall:  Good  Fund of Knowledge: Good  Language: Good  Akathisia:  Negative  Handed:  Right unknown  AIMS (if indicated):  Not done  Assets:  Communication Skills Desire for Improvement Leisure Time  ADL's:  Intact  Cognition: WNL  Sleep:  Good   Is the patient at risk to self?  No. Has the patient been a risk to self in the past 6 months?  No. Has the patient been a risk to self within the distant past?  No. Is the patient a risk to others?  No. Has the patient been  a risk to others in the past 6 months?  No. Has the patient been a risk to others within the distant past?  No.  Current Medications: Current Outpatient Prescriptions  Medication Sig Dispense Refill  . APPLE CIDER VINEGAR PO Take by mouth.    . dorzolamide-timolol (COSOPT) 22.3-6.8 MG/ML ophthalmic solution     . lisinopril (PRINIVIL,ZESTRIL) 20 MG tablet Take 1 tablet (20 mg total) by mouth daily. 90 tablet 3  . LORazepam (ATIVAN) 0.5 MG tablet Take 1 tablet (0.5 mg total) by mouth 3 (three) times daily. 90 tablet 4  . metFORMIN (GLUCOPHAGE) 500 MG tablet Take 1 tablet (500 mg total) by mouth 2 (two) times daily with a meal. 180 tablet 3  . MULTIPLE VITAMIN PO     . traZODone (DESYREL) 100 MG tablet Take 1 tablet (100 mg total) by mouth at bedtime as needed for sleep. 30 tablet 4  . venlafaxine XR (EFFEXOR-XR) 150 MG 24 hr capsule Take 1 capsule (150 mg total) by mouth daily. 30 capsule 4   No current facility-administered medications for this visit.    Medical Decision Making:  Established Problem, Stable/Improving (1)  Treatment Plan Summary:Medication management   OCD-Continue Effexor XR 150 mg daily, trazodone 100 mg at bedtime as needed  Decrease Ativan 0.5 mg to 2 times a day as needed for anxiety. Patient will follow up in 2 months. She's been encouraged: Questions or concerns prior to her next appointment. Patient requested a letter exempting her from [employer] duty, given her mental health.    Sander Speckman 01/17/2016, 3:06 PM

## 2016-01-19 DIAGNOSIS — H00022 Hordeolum internum right lower eyelid: Secondary | ICD-10-CM | POA: Diagnosis not present

## 2016-02-01 DIAGNOSIS — H00022 Hordeolum internum right lower eyelid: Secondary | ICD-10-CM | POA: Diagnosis not present

## 2016-03-13 ENCOUNTER — Ambulatory Visit: Payer: Medicare Other | Admitting: Psychiatry

## 2016-03-17 ENCOUNTER — Inpatient Hospital Stay (HOSPITAL_COMMUNITY)
Admission: RE | Admit: 2016-03-17 | Discharge: 2016-03-22 | DRG: 246 | Disposition: A | Discharge status: Home / Self Care | Payer: Medicare Other | Attending: Cardiovascular Disease | Admitting: Cardiovascular Disease

## 2016-03-17 ENCOUNTER — Encounter (HOSPITAL_COMMUNITY): Payer: Self-pay | Admitting: Cardiovascular Disease

## 2016-03-17 ENCOUNTER — Emergency Department: Payer: Medicare Other

## 2016-03-17 ENCOUNTER — Emergency Department
Admission: EM | Admit: 2016-03-17 | Discharge: 2016-03-17 | Disposition: A | Discharge status: Other Acute Inpatient Hospital | Payer: Medicare Other | Attending: Emergency Medicine | Admitting: Emergency Medicine

## 2016-03-17 ENCOUNTER — Encounter: Payer: Self-pay | Admitting: *Deleted

## 2016-03-17 ENCOUNTER — Encounter (HOSPITAL_COMMUNITY)
Admission: RE | Disposition: A | Discharge status: Home / Self Care | Payer: Self-pay | Source: Home / Self Care | Attending: Cardiovascular Disease

## 2016-03-17 DIAGNOSIS — Z886 Allergy status to analgesic agent status: Secondary | ICD-10-CM

## 2016-03-17 DIAGNOSIS — I2109 ST elevation (STEMI) myocardial infarction involving other coronary artery of anterior wall: Principal | ICD-10-CM | POA: Diagnosis present

## 2016-03-17 DIAGNOSIS — I509 Heart failure, unspecified: Secondary | ICD-10-CM

## 2016-03-17 DIAGNOSIS — F418 Other specified anxiety disorders: Secondary | ICD-10-CM | POA: Diagnosis present

## 2016-03-17 DIAGNOSIS — I255 Ischemic cardiomyopathy: Secondary | ICD-10-CM | POA: Diagnosis not present

## 2016-03-17 DIAGNOSIS — I214 Non-ST elevation (NSTEMI) myocardial infarction: Secondary | ICD-10-CM

## 2016-03-17 DIAGNOSIS — Z87891 Personal history of nicotine dependence: Secondary | ICD-10-CM

## 2016-03-17 DIAGNOSIS — R0789 Other chest pain: Secondary | ICD-10-CM | POA: Diagnosis present

## 2016-03-17 DIAGNOSIS — I2510 Atherosclerotic heart disease of native coronary artery without angina pectoris: Secondary | ICD-10-CM | POA: Diagnosis not present

## 2016-03-17 DIAGNOSIS — I252 Old myocardial infarction: Secondary | ICD-10-CM | POA: Diagnosis present

## 2016-03-17 DIAGNOSIS — Z8249 Family history of ischemic heart disease and other diseases of the circulatory system: Secondary | ICD-10-CM

## 2016-03-17 DIAGNOSIS — I2102 ST elevation (STEMI) myocardial infarction involving left anterior descending coronary artery: Secondary | ICD-10-CM | POA: Diagnosis not present

## 2016-03-17 DIAGNOSIS — R079 Chest pain, unspecified: Secondary | ICD-10-CM | POA: Diagnosis not present

## 2016-03-17 DIAGNOSIS — Z885 Allergy status to narcotic agent status: Secondary | ICD-10-CM | POA: Diagnosis not present

## 2016-03-17 DIAGNOSIS — I10 Essential (primary) hypertension: Secondary | ICD-10-CM | POA: Diagnosis present

## 2016-03-17 DIAGNOSIS — Z7984 Long term (current) use of oral hypoglycemic drugs: Secondary | ICD-10-CM | POA: Insufficient documentation

## 2016-03-17 DIAGNOSIS — Z79899 Other long term (current) drug therapy: Secondary | ICD-10-CM | POA: Insufficient documentation

## 2016-03-17 DIAGNOSIS — E785 Hyperlipidemia, unspecified: Secondary | ICD-10-CM | POA: Insufficient documentation

## 2016-03-17 DIAGNOSIS — E119 Type 2 diabetes mellitus without complications: Secondary | ICD-10-CM | POA: Insufficient documentation

## 2016-03-17 DIAGNOSIS — I5022 Chronic systolic (congestive) heart failure: Secondary | ICD-10-CM

## 2016-03-17 DIAGNOSIS — I447 Left bundle-branch block, unspecified: Secondary | ICD-10-CM | POA: Diagnosis not present

## 2016-03-17 DIAGNOSIS — F329 Major depressive disorder, single episode, unspecified: Secondary | ICD-10-CM | POA: Diagnosis not present

## 2016-03-17 DIAGNOSIS — Z23 Encounter for immunization: Secondary | ICD-10-CM | POA: Diagnosis not present

## 2016-03-17 DIAGNOSIS — I249 Acute ischemic heart disease, unspecified: Secondary | ICD-10-CM | POA: Diagnosis not present

## 2016-03-17 DIAGNOSIS — Z881 Allergy status to other antibiotic agents status: Secondary | ICD-10-CM

## 2016-03-17 DIAGNOSIS — I110 Hypertensive heart disease with heart failure: Secondary | ICD-10-CM | POA: Diagnosis present

## 2016-03-17 DIAGNOSIS — I5021 Acute systolic (congestive) heart failure: Secondary | ICD-10-CM | POA: Diagnosis present

## 2016-03-17 DIAGNOSIS — R7303 Prediabetes: Secondary | ICD-10-CM

## 2016-03-17 DIAGNOSIS — Z955 Presence of coronary angioplasty implant and graft: Secondary | ICD-10-CM

## 2016-03-17 DIAGNOSIS — E669 Obesity, unspecified: Secondary | ICD-10-CM | POA: Insufficient documentation

## 2016-03-17 DIAGNOSIS — I213 ST elevation (STEMI) myocardial infarction of unspecified site: Secondary | ICD-10-CM | POA: Diagnosis present

## 2016-03-17 DIAGNOSIS — I2542 Coronary artery dissection: Secondary | ICD-10-CM | POA: Diagnosis not present

## 2016-03-17 HISTORY — PX: CARDIAC CATHETERIZATION: SHX172

## 2016-03-17 HISTORY — DX: Old myocardial infarction: I25.2

## 2016-03-17 LAB — APTT: APTT: 26 s (ref 24–36)

## 2016-03-17 LAB — BASIC METABOLIC PANEL
Anion gap: 10 (ref 5–15)
BUN: 18 mg/dL (ref 6–20)
CHLORIDE: 101 mmol/L (ref 101–111)
CO2: 24 mmol/L (ref 22–32)
CREATININE: 0.83 mg/dL (ref 0.44–1.00)
Calcium: 9.5 mg/dL (ref 8.9–10.3)
GFR calc Af Amer: >60 mL/min (ref >60)
GFR calc non Af Amer: >60 mL/min (ref >60)
Glucose, Bld: 191 mg/dL — ABNORMAL HIGH (ref 65–99)
Potassium: 4.2 mmol/L (ref 3.5–5.1)
SODIUM: 135 mmol/L (ref 135–145)

## 2016-03-17 LAB — CBC
HCT: 37.7 % (ref 35.0–47.0)
Hemoglobin: 12 g/dL (ref 12.0–16.0)
MCH: 25.9 pg — AB (ref 26.0–34.0)
MCHC: 31.8 g/dL — ABNORMAL LOW (ref 32.0–36.0)
MCV: 81.5 fL (ref 80.0–100.0)
PLATELETS: 331 10*3/uL (ref 150–440)
RBC: 4.63 MIL/uL (ref 3.80–5.20)
RDW: 15.1 % — ABNORMAL HIGH (ref 11.5–14.5)
WBC: 11.4 10*3/uL — ABNORMAL HIGH (ref 3.6–11.0)

## 2016-03-17 LAB — TROPONIN I: Troponin I: 0.05 ng/mL — ABNORMAL HIGH (ref <0.031)

## 2016-03-17 LAB — PROTIME-INR
INR: 0.93
Prothrombin Time: 12.7 seconds (ref 11.4–15.0)

## 2016-03-17 LAB — BRAIN NATRIURETIC PEPTIDE: B NATRIURETIC PEPTIDE 5: 332 pg/mL — AB (ref 0.0–100.0)

## 2016-03-17 IMAGING — CR DG CHEST 2V
1 series · 2 of 2 positions shown · non-contrast
Comparison: None.

CLINICAL DATA: Chest pain radiating into the left arm, shortness of
Breath

EXAM:
CHEST  2 VIEW

[Series 1: dg chest 2 view · 0.14mm/px · 2 of 2 slices shown]
[im 1/2]
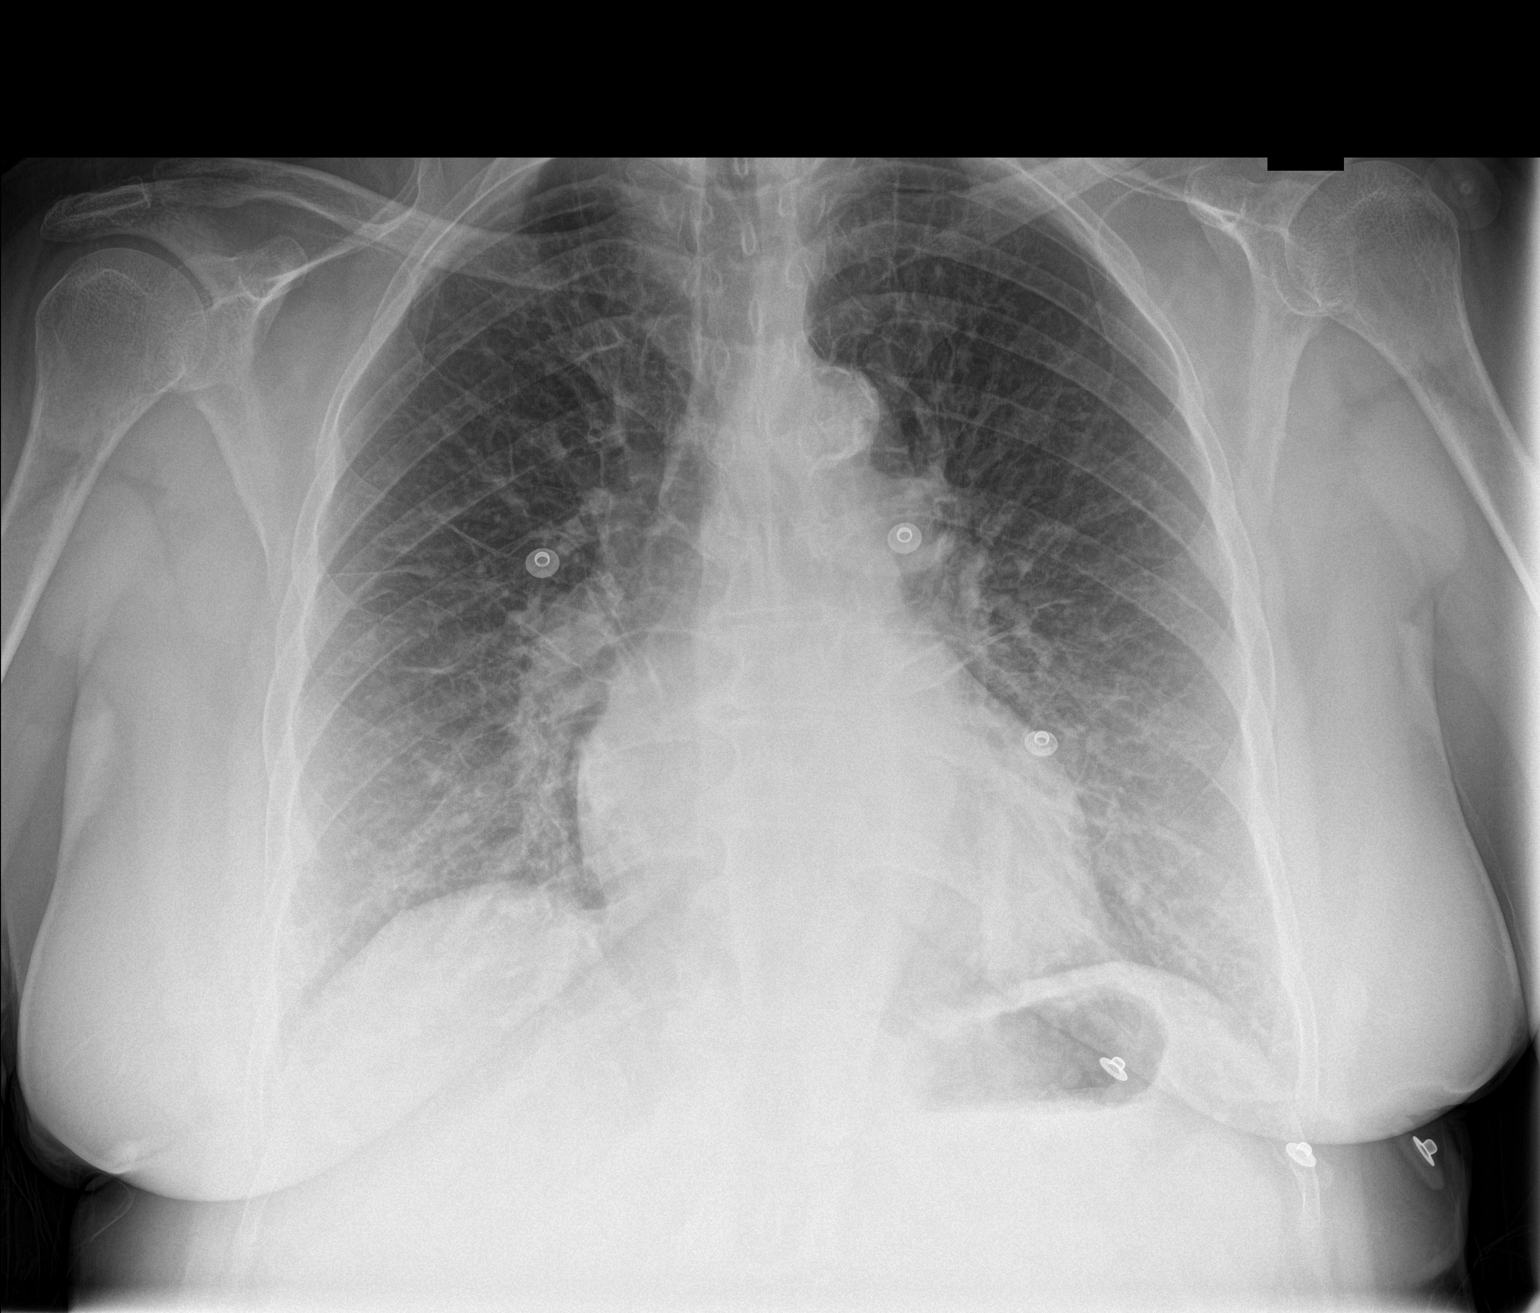
[im 2/2]
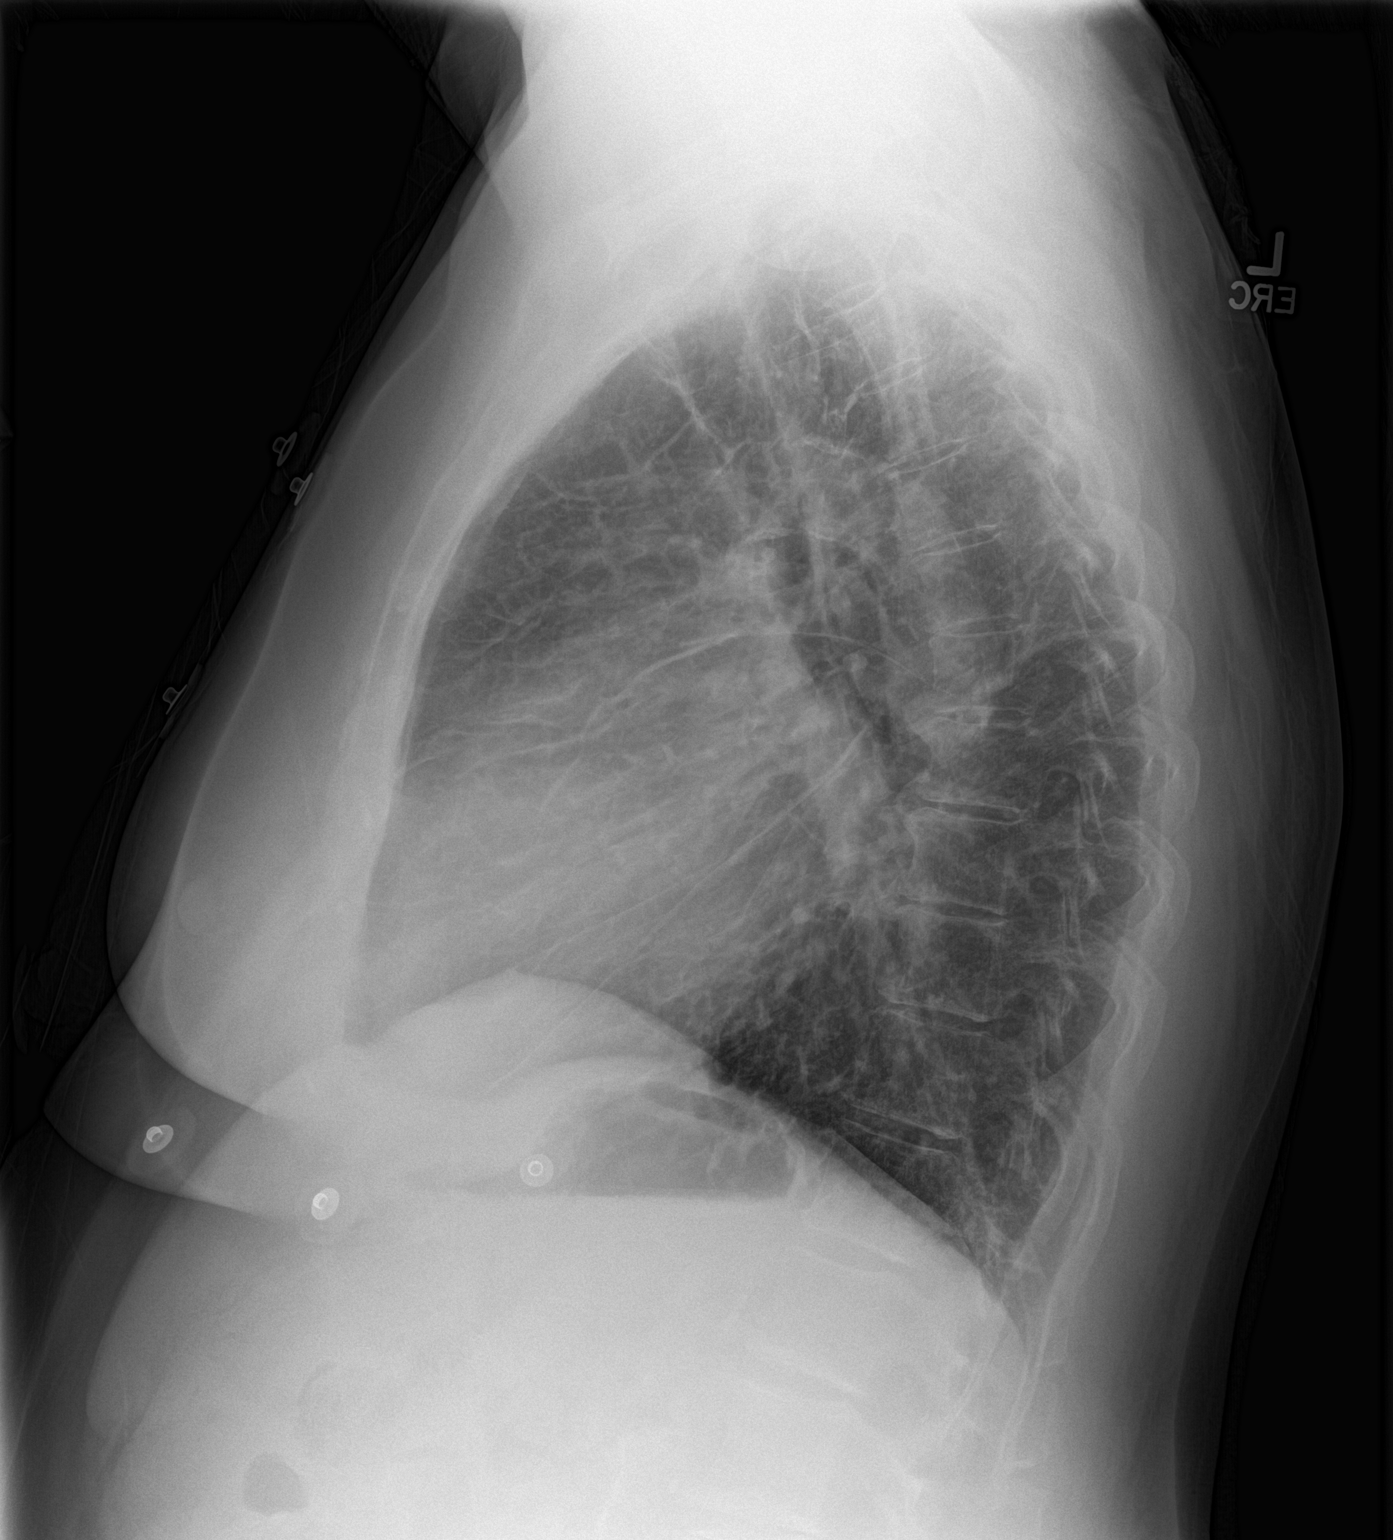

[2 of 2 positions shown; findings below may reference images not displayed]

FINDINGS: Cardiac shadow is within normal limits. Mild central vascular
congestion is seen with mild interstitial edema. No focal infiltrate
or sizable effusion is seen. No bony abnormality is noted.
IMPRESSION: Mild CHF.

## 2016-03-17 SURGERY — LEFT HEART CATH AND CORONARY ANGIOGRAPHY
Anesthesia: LOCAL

## 2016-03-17 MED ORDER — FENTANYL CITRATE (PF) 100 MCG/2ML IJ SOLN
INTRAMUSCULAR | Status: AC
  Filled 2016-03-17: qty 2

## 2016-03-17 MED ORDER — ONDANSETRON HCL 4 MG/2ML IJ SOLN
INTRAMUSCULAR | Status: DC | PRN
  Administered 2016-03-17: 4 mg via INTRAVENOUS

## 2016-03-17 MED ORDER — NITROGLYCERIN IN D5W 200-5 MCG/ML-% IV SOLN
INTRAVENOUS | Status: DC | PRN
  Administered 2016-03-17: 40 ug/min via INTRAVENOUS

## 2016-03-17 MED ORDER — HEPARIN BOLUS VIA INFUSION
4000.0000 [IU] | Freq: Once | INTRAVENOUS | Status: AC
  Administered 2016-03-17: 4000 [IU] via INTRAVENOUS
  Filled 2016-03-17: qty 4000

## 2016-03-17 MED ORDER — NITROGLYCERIN 2 % TD OINT
TOPICAL_OINTMENT | TRANSDERMAL | Status: AC
  Administered 2016-03-17: 1 [in_us] via TOPICAL
  Filled 2016-03-17: qty 1

## 2016-03-17 MED ORDER — HEPARIN (PORCINE) IN NACL 2-0.9 UNIT/ML-% IJ SOLN
INTRAMUSCULAR | Status: AC
  Filled 2016-03-17: qty 1500

## 2016-03-17 MED ORDER — MIDAZOLAM HCL 2 MG/2ML IJ SOLN
INTRAMUSCULAR | Status: DC | PRN
  Administered 2016-03-17: 1 mg via INTRAVENOUS

## 2016-03-17 MED ORDER — TIROFIBAN (AGGRASTAT) BOLUS VIA INFUSION
INTRAVENOUS | Status: DC | PRN
  Administered 2016-03-17: 1985 ug via INTRAVENOUS

## 2016-03-17 MED ORDER — TIROFIBAN HCL IN NACL 5-0.9 MG/100ML-% IV SOLN
INTRAVENOUS | Status: AC
  Filled 2016-03-17: qty 100

## 2016-03-17 MED ORDER — LIDOCAINE HCL (PF) 1 % IJ SOLN
INTRAMUSCULAR | Status: AC
  Filled 2016-03-17: qty 30

## 2016-03-17 MED ORDER — FENTANYL CITRATE (PF) 100 MCG/2ML IJ SOLN
INTRAMUSCULAR | Status: DC | PRN
  Administered 2016-03-17: 25 ug via INTRAVENOUS
  Administered 2016-03-17: 50 ug via INTRAVENOUS

## 2016-03-17 MED ORDER — METOPROLOL TARTRATE 5 MG/5ML IV SOLN
2.5000 mg | Freq: Once | INTRAVENOUS | Status: AC
  Administered 2016-03-17: 2.5 mg via INTRAVENOUS
  Filled 2016-03-17: qty 5

## 2016-03-17 MED ORDER — NITROGLYCERIN 1 MG/10 ML FOR IR/CATH LAB
INTRA_ARTERIAL | Status: DC | PRN
  Administered 2016-03-17 – 2016-03-18 (×2): 200 ug via INTRACORONARY

## 2016-03-17 MED ORDER — HEPARIN SODIUM (PORCINE) 1000 UNIT/ML IJ SOLN
INTRAMUSCULAR | Status: DC | PRN
  Administered 2016-03-17 (×2): 5000 [IU] via INTRAVENOUS

## 2016-03-17 MED ORDER — TICAGRELOR 90 MG PO TABS
ORAL_TABLET | ORAL | Status: AC
  Filled 2016-03-17: qty 2

## 2016-03-17 MED ORDER — NITROGLYCERIN 2 % TD OINT
1.0000 [in_us] | TOPICAL_OINTMENT | Freq: Once | TRANSDERMAL | Status: AC
  Administered 2016-03-17: 1 [in_us] via TOPICAL

## 2016-03-17 MED ORDER — IOPAMIDOL (ISOVUE-370) INJECTION 76%
INTRAVENOUS | Status: AC
  Filled 2016-03-17: qty 100

## 2016-03-17 MED ORDER — NITROGLYCERIN IN D5W 200-5 MCG/ML-% IV SOLN
0.0000 ug/min | Freq: Once | INTRAVENOUS | Status: AC
  Administered 2016-03-17: 5 ug/min via INTRAVENOUS
  Filled 2016-03-17: qty 250

## 2016-03-17 MED ORDER — NITROGLYCERIN 1 MG/10 ML FOR IR/CATH LAB
INTRA_ARTERIAL | Status: AC
  Filled 2016-03-17: qty 10

## 2016-03-17 MED ORDER — HEPARIN (PORCINE) IN NACL 100-0.45 UNIT/ML-% IJ SOLN
850.0000 [IU]/h | INTRAMUSCULAR | Status: DC
Start: 2016-03-17 — End: 2016-03-17
  Administered 2016-03-17: 850 [IU]/h via INTRAVENOUS
  Filled 2016-03-17: qty 250

## 2016-03-17 MED ORDER — VERAPAMIL HCL 2.5 MG/ML IV SOLN
INTRAVENOUS | Status: DC | PRN
  Administered 2016-03-17: 10 mL via INTRA_ARTERIAL

## 2016-03-17 MED ORDER — MIDAZOLAM HCL 2 MG/2ML IJ SOLN
INTRAMUSCULAR | Status: AC
  Filled 2016-03-17: qty 2

## 2016-03-17 MED ORDER — HEPARIN SODIUM (PORCINE) 1000 UNIT/ML IJ SOLN
INTRAMUSCULAR | Status: AC
  Filled 2016-03-17: qty 1

## 2016-03-17 MED ORDER — ONDANSETRON HCL 4 MG/2ML IJ SOLN
INTRAMUSCULAR | Status: AC
  Filled 2016-03-17: qty 2

## 2016-03-17 MED ORDER — VERAPAMIL HCL 2.5 MG/ML IV SOLN
INTRAVENOUS | Status: AC
  Filled 2016-03-17: qty 2

## 2016-03-17 MED ORDER — MORPHINE SULFATE (PF) 4 MG/ML IV SOLN
4.0000 mg | Freq: Once | INTRAVENOUS | Status: AC
  Administered 2016-03-17: 4 mg via INTRAVENOUS
  Filled 2016-03-17: qty 1

## 2016-03-17 SURGICAL SUPPLY — 21 items
BALLN ~~LOC~~ EMERGE MR 4.0X20 (BALLOONS) ×2
BALLN ~~LOC~~ TREK RX 2.75X8 (BALLOONS) ×2
BALLOON ~~LOC~~ EMERGE MR 4.0X20 (BALLOONS) ×1 IMPLANT
BALLOON ~~LOC~~ TREK RX 2.75X8 (BALLOONS) ×1 IMPLANT
CATH INFINITI 5FR ANG PIGTAIL (CATHETERS) ×2 IMPLANT
CATH INFINITI JR4 5F (CATHETERS) ×2 IMPLANT
CATH VISTA GUIDE 6FR XBLAD3.0 (CATHETERS) ×2 IMPLANT
CATH VISTA GUIDE 6FR XBLAD3.5 (CATHETERS) ×2 IMPLANT
DEVICE RAD COMP TR BAND LRG (VASCULAR PRODUCTS) ×2 IMPLANT
ELECT DEFIB PAD ADLT CADENCE (PAD) ×2 IMPLANT
GLIDESHEATH SLEND SS 6F .021 (SHEATH) ×2 IMPLANT
KIT ENCORE 26 ADVANTAGE (KITS) ×2 IMPLANT
KIT HEART LEFT (KITS) ×2 IMPLANT
PACK CARDIAC CATHETERIZATION (CUSTOM PROCEDURE TRAY) ×2 IMPLANT
STENT PROMUS PREM MR 2.5X12 (Permanent Stent) ×2 IMPLANT
STENT PROMUS PREM MR 3.5X24 (Permanent Stent) ×2 IMPLANT
SYR MEDRAD MARK V 150ML (SYRINGE) ×2 IMPLANT
TRANSDUCER W/STOPCOCK (MISCELLANEOUS) ×2 IMPLANT
TUBING CIL FLEX 10 FLL-RA (TUBING) ×2 IMPLANT
WIRE COUGAR XT STRL 190CM (WIRE) ×2 IMPLANT
WIRE SAFE-T 1.5MM-J .035X260CM (WIRE) ×2 IMPLANT

## 2016-03-17 NOTE — Progress Notes (Signed)
ANTICOAGULATION CONSULT NOTE - Initial Consult  Pharmacy Consult for heparin Indication: chest pain/ACS  Allergies  Allergen Reactions  . Meperidine Nausea And Vomiting  . Tetracycline Swelling    Patient Measurements: Height: 5\' 3"  (160 cm) Weight: 175 lb (79.379 kg) IBW/kg (Calculated) : 52.4 Heparin Dosing Weight: 69.7 kg  Vital Signs: Temp: 98 F (36.7 C) (05/13 2013) Temp Source: Oral (05/13 2013) BP: 163/75 mmHg (05/13 2200) Pulse Rate: 90 (05/13 2200)  Labs:  Recent Labs  03/17/16 2014  HGB 12.0  HCT 37.7  PLT 331  CREATININE 0.83  TROPONINI 0.05*    Estimated Creatinine Clearance: 67.4 mL/min (by C-G formula based on Cr of 0.83).   Medical History: Past Medical History  Diagnosis Date  . Anxiety   . Depression   . Hypertension   . Hyperlipemia   . Glaucoma     Medications:  Infusions:  . heparin      Assessment: 64 yof cc CP with positive troponin. Pharmacy consulted to dose heparin for ACS.   Goal of Therapy:  Heparin level 0.3-0.7 units/ml Monitor platelets by anticoagulation protocol: Yes   Plan:  Give 4000 units bolus x 1 Start heparin infusion at 850 units/hr Check anti-Xa level in 6 hours and daily while on heparin Continue to monitor H&H and platelets  Laural Benes, Pharm.D., BCPS Clinical Pharmacist 03/17/2016,10:16 PM

## 2016-03-17 NOTE — ED Notes (Signed)
Pt presents w/ c/o central chest pain radiating to L arm. Pt is short of breath, diaphoretic, and pale upon arrival. Pt presently c/o CP 4/10. Pt treated w/ NTG 0.4 mg SL X 2 DOSES per EMS. Pt took ASA 325 MG AT 1830 at recurrent onset of CP. Pt states CP last night and this morning, resolved w/o intervention beyond OTC meds. Pt states tonight pain was unrelieved after 1.5 hrs and she called EMS at this time.

## 2016-03-17 NOTE — ED Notes (Signed)
Attempt to call report to Cath lab x 2 at [phone] and [phone]. No answer from either number. Report given to [facility] RN at time of transport. Called [facility] ED, informed CN: Shanon Brow, RN that pt was on way to cath lab via [place]. He declined to take report at the time I called.

## 2016-03-17 NOTE — ED Provider Notes (Signed)
Time Seen: Approximately *2010 I have reviewed the triage notes  Chief Complaint: Chest Pain   History of Present Illness: Cing Kunda is a 65 y.o. female *who presents with acute onset of substernal chest discomfort with radiation up toward the left arm. Patient states that she had some mild pain last night and this morning and then seemed to resolve. The patient took some over-the-counter antacid medication which seemed to help her with her discomfort. Patient then states the pain returned again tonight approximately an hour and a half prior to arrival. She tried taking a shower and taking some over-the-counter antacid medication again but the pain still persisted. She has no known history of cardiovascular disease. Does have cardiac risk factors of high cholesterol, non-insulin-dependent diabetes and hypertension.   Past Medical History  Diagnosis Date  . Anxiety   . Depression   . Hypertension   . Hyperlipemia   . Glaucoma     Patient Active Problem List   Diagnosis Date Noted  . DM type 2 (diabetes mellitus, type 2) ([place]) 08/03/2015  . Essential hypertension 07/28/2015  . HLD (hyperlipidemia) 07/28/2015  . Glaucoma 07/28/2015  . Depression 07/28/2015  . Anxiety 07/28/2015  . Insomnia 07/28/2015  . Obesity (BMI 30.0-34.9) 07/28/2015  . Dizziness 07/28/2015    Past Surgical History  Procedure Laterality Date  . Small bowel repair    . Appendectomy    . Tonsillectomy      Past Surgical History  Procedure Laterality Date  . Small bowel repair    . Appendectomy    . Tonsillectomy      Current Outpatient Rx  Name  Route  Sig  Dispense  Refill  . APPLE CIDER VINEGAR PO   Oral   Take by mouth.         . dorzolamide-timolol (COSOPT) 22.3-6.8 MG/ML ophthalmic solution               . lisinopril (PRINIVIL,ZESTRIL) 20 MG tablet   Oral   Take 1 tablet (20 mg total) by mouth daily.   90 tablet   3   . LORazepam (ATIVAN) 0.5 MG tablet   Oral   Take 1  tablet (0.5 mg total) by mouth 2 (two) times daily as needed for anxiety.   60 tablet   1   . metFORMIN (GLUCOPHAGE) 500 MG tablet   Oral   Take 1 tablet (500 mg total) by mouth 2 (two) times daily with a meal.   180 tablet   3   . MULTIPLE VITAMIN PO               . traZODone (DESYREL) 100 MG tablet   Oral   Take 1 tablet (100 mg total) by mouth at bedtime as needed for sleep.   30 tablet   2   . venlafaxine XR (EFFEXOR-XR) 150 MG 24 hr capsule   Oral   Take 1 capsule (150 mg total) by mouth daily.   30 capsule   4     Allergies:  Meperidine and Tetracycline  Family History: Family History  Problem Relation Age of Onset  . Anxiety disorder Mother   . Paranoid behavior Mother   . Hypertension Mother   . Heart failure Mother   . Stroke Mother   . Dementia Mother   . Hypertension Father   . Anxiety disorder Maternal Aunt   . Drug abuse Cousin   . Tuberculosis Paternal Grandfather   . Mood Disorder Sister   .  Stroke Sister     Social History: Social History  Substance Use Topics  . Smoking status: Former Smoker    Types: Cigarettes    Quit date: 04/13/1997  . Smokeless tobacco: Never Used  . Alcohol Use: 0.6 oz/week    1 Glasses of wine, 0 Standard drinks or equivalent, 0 Cans of beer, 0 Shots of liquor per week     Review of Systems:   10 point review of systems was performed and was otherwise negative:  Constitutional: No fever Eyes: No visual disturbances ENT: No sore throat, ear pain Cardiac: Chest pain radiates up toward the left upper chest and left arm region Respiratory: No shortness of breath, wheezing, or stridor Abdomen: No abdominal pain, no vomiting, No diarrhea Endocrine: No weight loss, No night sweats Extremities: No peripheral edema, cyanosis Skin: No rashes, easy bruising Neurologic: No focal weakness, trouble with speech or swollowing Urologic: No dysuria, Hematuria, or urinary frequency   Physical Exam:  ED Triage Vitals   Enc Vitals Group     BP 03/17/16 2013 157/88 mmHg     Pulse Rate 03/17/16 2013 97     Resp 03/17/16 2013 30     Temp 03/17/16 2013 98 F (36.7 C)     Temp Source 03/17/16 2013 Oral     SpO2 03/17/16 2003 92 %     Weight 03/17/16 2013 175 lb (79.379 kg)     Height 03/17/16 2013 5\' 3"  (1.6 m)     Head Cir --      Peak Flow --      Pain Score 03/17/16 2015 4     Pain Loc --      Pain Edu? --      Excl. in [place]? --     General: Awake , Alert , and Oriented times 3; GCS 15 Head: Normal cephalic , atraumatic Eyes: Pupils equal , round, reactive to light Nose/Throat: No nasal drainage, patent upper airway without erythema or exudate.  Neck: Supple, Full range of motion, No anterior adenopathy or palpable thyroid masses Lungs: Clear to ascultation without wheezes , rhonchi, or rales Heart: Regular rate, regular rhythm without murmurs , gallops , or rubs Abdomen: Soft, non tender without rebound, guarding , or rigidity; bowel sounds positive and symmetric in all 4 quadrants. No organomegaly .        Extremities: 2 plus symmetric pulses. No edema, clubbing or cyanosis Neurologic: normal ambulation, Motor symmetric without deficits, sensory intact Skin: warm, dry, no rashes   Labs:   All laboratory work was reviewed including any pertinent negatives or positives listed below:  Labs Reviewed  BASIC METABOLIC PANEL - Abnormal; Notable for the following:    Glucose, Bld 191 (*)    All other components within normal limits  CBC - Abnormal; Notable for the following:    WBC 11.4 (*)    MCH 25.9 (*)    MCHC 31.8 (*)    RDW 15.1 (*)    All other components within normal limits  TROPONIN I - Abnormal; Notable for the following:    Troponin I 0.05 (*)    All other components within normal limits  BRAIN NATRIURETIC PEPTIDE - Abnormal; Notable for the following:    B Natriuretic Peptide 332.0 (*)    All other components within normal limits  APTT  PROTIME-INR  HEPARIN LEVEL  (UNFRACTIONATED)  Troponin is slightly elevated  EKG:  ED ECG REPORT I, Daymon Larsen, the attending physician, personally viewed and interpreted  this ECG.  Date: 03/17/2016 EKG Time: 2004 Rate: 92* Rhythm: normal sinus rhythm QRS Axis: normal Intervals: Left bundle branch block ST/T Wave abnormalities: normal Conduction Disturbances: none Narrative Interpretation: There is no old EKGs available for comparison  Radiology:   DG Chest 2 View (Final result) Result time: 03/17/16 20:48:00   Final result by Rad Results In Interface (03/17/16 20:48:00)   Narrative:   CLINICAL DATA: Chest pain radiating into the left arm, shortness of Breath  EXAM: CHEST 2 VIEW  COMPARISON: None.  FINDINGS: Cardiac shadow is within normal limits. Mild central vascular congestion is seen with mild interstitial edema. No focal infiltrate or sizable effusion is seen. No bony abnormality is noted.  IMPRESSION: Mild CHF.   Electronically Signed By: Inez Catalina M.D. On: 03/17/2016 20:48         I personally reviewed the radiologic studies   ED Course:  Patient presents with a clinical history concerning for acute coronary syndrome with EKG finding of a left bundle branch block. There does not appear to be any associated ischemic changes on the left bundle branch block that don't have an old EKG available for comparison. We decided to place nitroglycerin paste as the patient was feeling better on arrival and had nitroglycerin spray and aspirin therapy prior to her evaluation here by EMS. The patient states some symptomatic improvement and then stated later that her pain started to return. Because of the clinical history and the fact that I cannot prove produce. This is said to bundle-branch block pattern on her EKG I felt that she needed to contact the STEMI doctor on call for [facility]. I spoke briefly to the cardiac fellow who relayed the message and EKG findings to the STEMI  cardiologist and arrangements for transfer been established. The patient will receive weight-based heparin bolus along with a nitroglycerin drip. She was given low-dose Lopressor IV. She is otherwise hemodynamically stable.  CRITICAL CARE Performed by: Daymon Larsen   Total critical care time: 35 minutes  Critical care time was exclusive of separately billable procedures and treating other patients.  Critical care was necessary to treat or prevent imminent or life-threatening deterioration.  Critical care was time spent personally by me on the following activities: development of treatment plan with patient and/or surrogate as well as nursing, discussions with consultants, evaluation of patient's response to treatment, examination of patient, obtaining history from patient or surrogate, ordering and performing treatments and interventions, ordering and review of laboratory studies, ordering and review of radiographic studies, pulse oximetry and re-evaluation of patient's condition.  Assessment:  Acute coronary syndrome  Final Clinical Impression:  Final diagnoses:  Non-STEMI (non-ST elevated myocardial infarction) [facility])     Plan: Transfer to [facility] per Juliette Mangle, MD 03/17/16 2222

## 2016-03-17 NOTE — ED Notes (Signed)
Called Lab about TropI, five minutes unti resulted.

## 2016-03-17 NOTE — H&P (Signed)
History and Physical  Patient ID: Melanie Ray MRN: [id number], SOB: 07/23/1951 65 y.o. Date of Encounter: 03/18/2016, 12:31 AM  Primary Physician: Coral Spikes, DO Primary Cardiologist: none  Chief Complaint: Chest pain  HPI: 65 y.o. female w/ PMHx significant for diabetes, HTN, hyperlipidemia who presented to [facility] on 03/18/2016 with complaints of chest pain.  The patient is transferred directly to the cardiac catheterization lab from [facility] emergency department. She presented there with substernal chest pain described as a pressure-like sensation. Her pain started at approximately 5 PM tonight. There is associated shortness of breath, diaphoresis, nausea, and vomiting. She has not had pain similar to this in the past. On arrival to the Cath Lab, she is diaphoretic and vomiting. She continues to have chest discomfort.  The patient has no personal history of cardiac disease. She takes metformin for type 2 diabetes. She is a retired Radio producer. She is a remote smoker, quit in 1998. She has no family history of coronary artery disease. Her mother died of congestive heart failure.   Past Medical History  Diagnosis Date  . Anxiety   . Depression   . Hypertension   . Hyperlipemia   . Glaucoma   . ST elevation myocardial infarction (STEMI) of anterior wall ([place]) 03/17/2016     Surgical History:  Past Surgical History  Procedure Laterality Date  . Small bowel repair    . Appendectomy    . Tonsillectomy       Home Meds: Prior to Admission medications   Medication Sig Start Date End Date Taking? Authorizing Provider  APPLE CIDER VINEGAR PO Take by mouth.    Historical Provider, MD  dorzolamide-timolol (COSOPT) 22.3-6.8 MG/ML ophthalmic solution  01/17/15   Historical Provider, MD  lisinopril (PRINIVIL,ZESTRIL) 20 MG tablet Take 1 tablet (20 mg total) by mouth daily. 07/29/15   Coral Spikes, DO  LORazepam (ATIVAN) 0.5 MG tablet Take 1 tablet (0.5 mg total) by mouth  2 (two) times daily as needed for anxiety. 01/17/16   Himabindu Ravi, MD  metFORMIN (GLUCOPHAGE) 500 MG tablet Take 1 tablet (500 mg total) by mouth 2 (two) times daily with a meal. 08/03/15   Coral Spikes, DO  MULTIPLE VITAMIN PO     Historical Provider, MD  traZODone (DESYREL) 100 MG tablet Take 1 tablet (100 mg total) by mouth at bedtime as needed for sleep. 01/17/16   Himabindu Ravi, MD  venlafaxine XR (EFFEXOR-XR) 150 MG 24 hr capsule Take 1 capsule (150 mg total) by mouth daily. 01/17/16   Elvin So, MD    Allergies:  Allergies  Allergen Reactions  . Meperidine Nausea And Vomiting  . Tetracycline Swelling    Social History   Social History  . Marital Status: Married    Spouse Name: N/A  . Number of Children: N/A  . Years of Education: N/A   Occupational History  . Not on file.   Social History Main Topics  . Smoking status: Former Smoker    Types: Cigarettes    Quit date: 04/13/1997  . Smokeless tobacco: Never Used  . Alcohol Use: 0.6 oz/week    1 Glasses of wine, 0 Standard drinks or equivalent, 0 Cans of beer, 0 Shots of liquor per week  . Drug Use: No  . Sexual Activity: No   Other Topics Concern  . Not on file   Social History Narrative     Family History  Problem Relation Age of Onset  . Anxiety disorder  Mother   . Paranoid behavior Mother   . Hypertension Mother   . Heart failure Mother   . Stroke Mother   . Dementia Mother   . Hypertension Father   . Anxiety disorder Maternal Aunt   . Drug abuse Cousin   . Tuberculosis Paternal Grandfather   . Mood Disorder Sister   . Stroke Sister     Review of Systems: General: negative for chills, fever, night sweats or weight changes.  ENT: negative for rhinorrhea or epistaxis Cardiovascular: See history of present illness  Dermatological: negative for rash Respiratory: Positive for cough and shortness of breath GI: Positive for nausea, vomiting, negative for diarrhea, bright red blood per rectum,  melena, or hematemesis GU: no hematuria, urgency, or frequency Neurologic: negative for visual changes, syncope, headache, or dizziness Heme: no easy bruising or bleeding Endo: negative for excessive thirst, thyroid disorder, or flushing Musculoskeletal: negative for joint pain or swelling, negative for myalgias All other systems reviewed and are otherwise negative except as noted above.  Physical Exam: Blood pressure 147/81, pulse 0, resp. rate 0, SpO2 0 %. General: Well developed, well nourished, pleasant overweight woman, alert and oriented, in moderate distress. HEENT: Normocephalic, atraumatic, sclera anicteric Neck: Supple. Carotids 2+ without bruits. JVP normal Lungs: Clear bilaterally to auscultation without wheezes, rales, or rhonchi. Breathing is unlabored. Heart: RRR with normal S1 and S2. No murmurs, rubs, or gallops appreciated. Abdomen: Soft, non-tender, non-distended with normoactive bowel sounds. No hepatomegaly. No rebound/guarding. No obvious abdominal masses. Back: No CVA tenderness Msk:  Strength and tone appear normal for age. Extremities: No clubbing, cyanosis, or edema.  Distal pedal pulses are 2+ and equal bilaterally. Neuro: CNII-XII intact, moves all extremities spontaneously. Psych:  Responds to questions appropriately with a normal affect. Skin: diaphoretic, no rash   Labs:   Lab Results  Component Value Date   WBC 11.4* 03/17/2016   HGB 12.0 03/17/2016   HCT 37.7 03/17/2016   MCV 81.5 03/17/2016   PLT 331 03/17/2016     Recent Labs Lab 03/17/16 2014  NA 135  K 4.2  CL 101  CO2 24  BUN 18  CREATININE 0.83  CALCIUM 9.5  GLUCOSE 191*    Recent Labs  03/17/16 2014  TROPONINI 0.05*   Lab Results  Component Value Date   CHOL 246* 07/28/2015   HDL 37.60* 07/28/2015   LDLCALC 173* 07/28/2015   TRIG 177.0* 07/28/2015   No results found for: DDIMER  Radiology/Studies:  Dg Chest 2 View  03/17/2016  CLINICAL DATA:  Chest pain radiating  into the left arm, shortness of Breath EXAM: CHEST  2 VIEW COMPARISON:  None. FINDINGS: Cardiac shadow is within normal limits. Mild central vascular congestion is seen with mild interstitial edema. No focal infiltrate or sizable effusion is seen. No bony abnormality is noted. IMPRESSION: Mild CHF. Electronically Signed   By: Inez Catalina M.D.   On: 03/17/2016 20:48     EKG: Normal sinus rhythm with left bundle branch block, concordant ST elevation in V4 is diagnostic of anterior STEMI  ASSESSMENT AND PLAN:  1. Anterior STEMI: Patient with left bundle branch block and concordant ST segment elevation highly suggestive of an anterior wall MI, especially in light of her clinical presentation. She took aspirin at home and receive IV heparin prior to arrival here. We will proceed with emergency cardiac catheterization and primary PCI. Emergency implied consent is obtained.  2. Type 2 diabetes: Plan to cover with sliding scale insulin and hold metformin  after angiography.  3. Hypertension: Will adjust antihypertensive regimen for appropriate post MI medical therapy which will need to include a beta blocker and a/ARB  4. Dyslipidemia: We will initiate a high intensity statin if she is not already taking this.  Further plans/disposition pending cath/PCI results.  Deatra James MD 03/18/2016, 12:31 AM

## 2016-03-18 ENCOUNTER — Inpatient Hospital Stay (HOSPITAL_COMMUNITY): Payer: Medicare Other

## 2016-03-18 DIAGNOSIS — F418 Other specified anxiety disorders: Secondary | ICD-10-CM | POA: Diagnosis present

## 2016-03-18 DIAGNOSIS — Z881 Allergy status to other antibiotic agents status: Secondary | ICD-10-CM | POA: Diagnosis not present

## 2016-03-18 DIAGNOSIS — I5021 Acute systolic (congestive) heart failure: Secondary | ICD-10-CM | POA: Diagnosis not present

## 2016-03-18 DIAGNOSIS — I2109 ST elevation (STEMI) myocardial infarction involving other coronary artery of anterior wall: Secondary | ICD-10-CM | POA: Diagnosis not present

## 2016-03-18 DIAGNOSIS — Z87891 Personal history of nicotine dependence: Secondary | ICD-10-CM | POA: Diagnosis not present

## 2016-03-18 DIAGNOSIS — I2102 ST elevation (STEMI) myocardial infarction involving left anterior descending coronary artery: Secondary | ICD-10-CM | POA: Diagnosis not present

## 2016-03-18 DIAGNOSIS — I213 ST elevation (STEMI) myocardial infarction of unspecified site: Secondary | ICD-10-CM | POA: Diagnosis not present

## 2016-03-18 DIAGNOSIS — I2542 Coronary artery dissection: Secondary | ICD-10-CM | POA: Diagnosis not present

## 2016-03-18 DIAGNOSIS — I252 Old myocardial infarction: Secondary | ICD-10-CM | POA: Diagnosis present

## 2016-03-18 DIAGNOSIS — I110 Hypertensive heart disease with heart failure: Secondary | ICD-10-CM | POA: Diagnosis present

## 2016-03-18 DIAGNOSIS — Z885 Allergy status to narcotic agent status: Secondary | ICD-10-CM | POA: Diagnosis not present

## 2016-03-18 DIAGNOSIS — R079 Chest pain, unspecified: Secondary | ICD-10-CM

## 2016-03-18 DIAGNOSIS — E785 Hyperlipidemia, unspecified: Secondary | ICD-10-CM | POA: Diagnosis not present

## 2016-03-18 DIAGNOSIS — E119 Type 2 diabetes mellitus without complications: Secondary | ICD-10-CM | POA: Diagnosis present

## 2016-03-18 DIAGNOSIS — I2510 Atherosclerotic heart disease of native coronary artery without angina pectoris: Secondary | ICD-10-CM | POA: Diagnosis present

## 2016-03-18 DIAGNOSIS — I447 Left bundle-branch block, unspecified: Secondary | ICD-10-CM | POA: Diagnosis present

## 2016-03-18 DIAGNOSIS — R0602 Shortness of breath: Secondary | ICD-10-CM | POA: Diagnosis not present

## 2016-03-18 DIAGNOSIS — Z886 Allergy status to analgesic agent status: Secondary | ICD-10-CM | POA: Diagnosis not present

## 2016-03-18 DIAGNOSIS — Z8249 Family history of ischemic heart disease and other diseases of the circulatory system: Secondary | ICD-10-CM | POA: Diagnosis not present

## 2016-03-18 DIAGNOSIS — I25110 Atherosclerotic heart disease of native coronary artery with unstable angina pectoris: Secondary | ICD-10-CM | POA: Diagnosis not present

## 2016-03-18 DIAGNOSIS — I255 Ischemic cardiomyopathy: Secondary | ICD-10-CM | POA: Diagnosis not present

## 2016-03-18 DIAGNOSIS — Z7984 Long term (current) use of oral hypoglycemic drugs: Secondary | ICD-10-CM | POA: Diagnosis not present

## 2016-03-18 DIAGNOSIS — I10 Essential (primary) hypertension: Secondary | ICD-10-CM | POA: Diagnosis not present

## 2016-03-18 DIAGNOSIS — Z23 Encounter for immunization: Secondary | ICD-10-CM | POA: Diagnosis not present

## 2016-03-18 LAB — ECHOCARDIOGRAM COMPLETE
Height: 63 in
WEIGHTICAEL: 2800.72 [oz_av]

## 2016-03-18 LAB — CBC
HCT: 33.6 % — ABNORMAL LOW (ref 36.0–46.0)
Hemoglobin: 10.6 g/dL — ABNORMAL LOW (ref 12.0–15.0)
MCH: 25.9 pg — ABNORMAL LOW (ref 26.0–34.0)
MCHC: 31.5 g/dL (ref 30.0–36.0)
MCV: 82 fL (ref 78.0–100.0)
PLATELETS: 311 10*3/uL (ref 150–400)
RBC: 4.1 MIL/uL (ref 3.87–5.11)
RDW: 14.4 % (ref 11.5–15.5)
WBC: 12.4 10*3/uL — AB (ref 4.0–10.5)

## 2016-03-18 LAB — LIPID PANEL
Cholesterol: 227 mg/dL — ABNORMAL HIGH (ref 0–200)
HDL: 34 mg/dL — AB (ref >40)
LDL Cholesterol: 171 mg/dL — ABNORMAL HIGH (ref 0–99)
TRIGLYCERIDES: 112 mg/dL (ref <150)
Total CHOL/HDL Ratio: 6.7 RATIO
VLDL: 22 mg/dL (ref 0–40)

## 2016-03-18 LAB — TSH: TSH: 0.589 u[IU]/mL (ref 0.350–4.500)

## 2016-03-18 LAB — T4, FREE: Free T4: 1.2 ng/dL — ABNORMAL HIGH (ref 0.61–1.12)

## 2016-03-18 LAB — GLUCOSE, CAPILLARY
GLUCOSE-CAPILLARY: 137 mg/dL — AB (ref 65–99)
GLUCOSE-CAPILLARY: 145 mg/dL — AB (ref 65–99)
Glucose-Capillary: 123 mg/dL — ABNORMAL HIGH (ref 65–99)

## 2016-03-18 LAB — BASIC METABOLIC PANEL
ANION GAP: 14 (ref 5–15)
BUN: 11 mg/dL (ref 6–20)
CALCIUM: 9.1 mg/dL (ref 8.9–10.3)
CHLORIDE: 103 mmol/L (ref 101–111)
CO2: 20 mmol/L — AB (ref 22–32)
CREATININE: 0.9 mg/dL (ref 0.44–1.00)
GLUCOSE: 141 mg/dL — AB (ref 65–99)
Potassium: 3.6 mmol/L (ref 3.5–5.1)
Sodium: 137 mmol/L (ref 135–145)

## 2016-03-18 LAB — BRAIN NATRIURETIC PEPTIDE: B NATRIURETIC PEPTIDE 5: 514.2 pg/mL — AB (ref 0.0–100.0)

## 2016-03-18 LAB — TROPONIN I
TROPONIN I: 2.3 ng/mL — AB (ref <0.031)
Troponin I: 2.71 ng/mL (ref <0.031)

## 2016-03-18 LAB — MRSA PCR SCREENING: MRSA BY PCR: NEGATIVE

## 2016-03-18 MED ORDER — POTASSIUM CHLORIDE CRYS ER 20 MEQ PO TBCR
40.0000 meq | EXTENDED_RELEASE_TABLET | Freq: Once | ORAL | Status: AC
  Administered 2016-03-18: 40 meq via ORAL
  Filled 2016-03-18: qty 2

## 2016-03-18 MED ORDER — SODIUM CHLORIDE 0.9 % IV SOLN: 250.0000 mL | INTRAVENOUS | Status: DC | PRN

## 2016-03-18 MED ORDER — OXYCODONE-ACETAMINOPHEN 5-325 MG PO TABS
1.0000 | ORAL_TABLET | ORAL | Status: DC | PRN
  Administered 2016-03-18 – 2016-03-20 (×4): 2 via ORAL
  Filled 2016-03-18 (×4): qty 2

## 2016-03-18 MED ORDER — SODIUM CHLORIDE 0.9% FLUSH: 3.0000 mL | INTRAVENOUS | Status: DC | PRN

## 2016-03-18 MED ORDER — NITROGLYCERIN IN D5W 200-5 MCG/ML-% IV SOLN
45.0000 ug/min | INTRAVENOUS | Status: DC
Start: 2016-03-18 — End: 2016-03-19
  Administered 2016-03-18: 45 ug/min via INTRAVENOUS
  Administered 2016-03-19: 30 ug/min via INTRAVENOUS

## 2016-03-18 MED ORDER — LABETALOL HCL 5 MG/ML IV SOLN
20.0000 mg | INTRAVENOUS | Status: DC | PRN
  Administered 2016-03-18 (×2): 20 mg via INTRAVENOUS
  Filled 2016-03-18 (×2): qty 4

## 2016-03-18 MED ORDER — SODIUM CHLORIDE 0.9% FLUSH
3.0000 mL | Freq: Two times a day (BID) | INTRAVENOUS | Status: DC
  Administered 2016-03-19 – 2016-03-21 (×4): 3 mL via INTRAVENOUS

## 2016-03-18 MED ORDER — HEPARIN (PORCINE) IN NACL 2-0.9 UNIT/ML-% IJ SOLN
INTRAMUSCULAR | Status: DC | PRN
  Administered 2016-03-18

## 2016-03-18 MED ORDER — FENTANYL CITRATE (PF) 100 MCG/2ML IJ SOLN
25.0000 ug | INTRAMUSCULAR | Status: DC | PRN
  Administered 2016-03-18: 25 ug via INTRAVENOUS

## 2016-03-18 MED ORDER — ENOXAPARIN SODIUM 40 MG/0.4ML ~~LOC~~ SOLN
40.0000 mg | SUBCUTANEOUS | Status: DC
  Administered 2016-03-18 – 2016-03-21 (×4): 40 mg via SUBCUTANEOUS
  Filled 2016-03-18 (×5): qty 0.4

## 2016-03-18 MED ORDER — FUROSEMIDE 10 MG/ML IJ SOLN
20.0000 mg | Freq: Once | INTRAMUSCULAR | Status: AC
  Administered 2016-03-18: 20 mg via INTRAVENOUS
  Filled 2016-03-18: qty 2

## 2016-03-18 MED ORDER — LABETALOL HCL 5 MG/ML IV SOLN
INTRAVENOUS | Status: DC | PRN
  Administered 2016-03-18: 20 mg via INTRAVENOUS

## 2016-03-18 MED ORDER — LISINOPRIL 10 MG PO TABS
20.0000 mg | ORAL_TABLET | Freq: Every day | ORAL | Status: DC
  Administered 2016-03-18 – 2016-03-22 (×5): 20 mg via ORAL
  Filled 2016-03-18: qty 1
  Filled 2016-03-18: qty 2
  Filled 2016-03-18: qty 1
  Filled 2016-03-18 (×2): qty 2

## 2016-03-18 MED ORDER — FUROSEMIDE 10 MG/ML IJ SOLN
40.0000 mg | Freq: Two times a day (BID) | INTRAMUSCULAR | Status: DC
  Administered 2016-03-18 – 2016-03-21 (×6): 40 mg via INTRAVENOUS
  Filled 2016-03-18 (×6): qty 4

## 2016-03-18 MED ORDER — NITROGLYCERIN IN D5W 200-5 MCG/ML-% IV SOLN
INTRAVENOUS | Status: AC
  Filled 2016-03-18: qty 250

## 2016-03-18 MED ORDER — INSULIN ASPART 100 UNIT/ML ~~LOC~~ SOLN
0.0000 [IU] | Freq: Three times a day (TID) | SUBCUTANEOUS | Status: DC
  Administered 2016-03-18 (×2): 2 [IU] via SUBCUTANEOUS
  Administered 2016-03-19: 3 [IU] via SUBCUTANEOUS
  Administered 2016-03-19 – 2016-03-22 (×2): 2 [IU] via SUBCUTANEOUS

## 2016-03-18 MED ORDER — IOPAMIDOL (ISOVUE-370) INJECTION 76%
INTRAVENOUS | Status: DC | PRN
  Administered 2016-03-18: 160 mL

## 2016-03-18 MED ORDER — SODIUM CHLORIDE 0.9% FLUSH
3.0000 mL | Freq: Two times a day (BID) | INTRAVENOUS | Status: DC
  Administered 2016-03-18 – 2016-03-21 (×3): 3 mL via INTRAVENOUS

## 2016-03-18 MED ORDER — MORPHINE SULFATE (PF) 2 MG/ML IV SOLN: 2.0000 mg | INTRAVENOUS | Status: DC | PRN

## 2016-03-18 MED ORDER — TICAGRELOR 90 MG PO TABS
90.0000 mg | ORAL_TABLET | Freq: Two times a day (BID) | ORAL | Status: DC
  Administered 2016-03-18 – 2016-03-22 (×9): 90 mg via ORAL
  Filled 2016-03-18 (×9): qty 1

## 2016-03-18 MED ORDER — NITROGLYCERIN 0.4 MG SL SUBL
0.4000 mg | SUBLINGUAL_TABLET | SUBLINGUAL | Status: DC | PRN
  Administered 2016-03-19 (×3): 0.4 mg via SUBLINGUAL
  Filled 2016-03-18: qty 1

## 2016-03-18 MED ORDER — ASPIRIN EC 81 MG PO TBEC
81.0000 mg | DELAYED_RELEASE_TABLET | Freq: Every day | ORAL | Status: DC
  Administered 2016-03-18 – 2016-03-22 (×5): 81 mg via ORAL
  Filled 2016-03-18 (×5): qty 1

## 2016-03-18 MED ORDER — TICAGRELOR 90 MG PO TABS
180.0000 mg | ORAL_TABLET | Freq: Once | ORAL | Status: AC
  Administered 2016-03-18: 180 mg via ORAL

## 2016-03-18 MED ORDER — ATORVASTATIN CALCIUM 80 MG PO TABS
80.0000 mg | ORAL_TABLET | Freq: Every day | ORAL | Status: DC
  Administered 2016-03-18 – 2016-03-21 (×4): 80 mg via ORAL
  Filled 2016-03-18 (×4): qty 1

## 2016-03-18 MED ORDER — LABETALOL HCL 5 MG/ML IV SOLN
INTRAVENOUS | Status: AC
  Filled 2016-03-18: qty 4

## 2016-03-18 MED ORDER — ACETAMINOPHEN 325 MG PO TABS: 650.0000 mg | ORAL_TABLET | ORAL | Status: DC | PRN

## 2016-03-18 MED ORDER — PNEUMOCOCCAL VAC POLYVALENT 25 MCG/0.5ML IJ INJ
0.5000 mL | INJECTION | INTRAMUSCULAR | Status: AC
  Administered 2016-03-19: 0.5 mL via INTRAMUSCULAR

## 2016-03-18 MED ORDER — ONDANSETRON HCL 4 MG/2ML IJ SOLN: 4.0000 mg | Freq: Four times a day (QID) | INTRAMUSCULAR | Status: DC | PRN

## 2016-03-18 MED ORDER — SODIUM CHLORIDE 0.9% FLUSH
3.0000 mL | INTRAVENOUS | Status: DC | PRN
  Administered 2016-03-21: 3 mL via INTRAVENOUS
  Filled 2016-03-18: qty 3

## 2016-03-18 MED ORDER — FENTANYL CITRATE (PF) 100 MCG/2ML IJ SOLN
INTRAMUSCULAR | Status: AC
  Filled 2016-03-18: qty 2

## 2016-03-18 MED ORDER — PERFLUTREN LIPID MICROSPHERE
INTRAVENOUS | Status: AC
  Administered 2016-03-18: 2 mL
  Filled 2016-03-18: qty 10

## 2016-03-18 MED ORDER — CARVEDILOL 6.25 MG PO TABS
6.2500 mg | ORAL_TABLET | Freq: Two times a day (BID) | ORAL | Status: DC
  Administered 2016-03-18 – 2016-03-22 (×8): 6.25 mg via ORAL
  Filled 2016-03-18 (×9): qty 1

## 2016-03-18 NOTE — Progress Notes (Signed)
Utilization review completed.  

## 2016-03-18 NOTE — Progress Notes (Signed)
Patient ID: Melanie Ray, female   DOB: 1951/09/05, 65 y.o.   MRN: [id number]   SUBJECTIVE: She had mild recurrent chest pain earlier this morning, now CP-free.  No dyspnea at rest.    Scheduled Meds: . aspirin EC  81 mg Oral Daily  . atorvastatin  80 mg Oral q1800  . carvedilol  6.25 mg Oral BID WC  . enoxaparin (LOVENOX) injection  40 mg Subcutaneous Q24H  . fentaNYL      . furosemide  40 mg Intravenous BID  . insulin aspart  0-15 Units Subcutaneous TID WC  . lisinopril  20 mg Oral Daily  . potassium chloride  40 mEq Oral Once  . sodium chloride flush  3 mL Intravenous Q12H  . sodium chloride flush  3 mL Intravenous Q12H  . ticagrelor  90 mg Oral BID   Continuous Infusions:  PRN Meds:.sodium chloride, sodium chloride, acetaminophen, fentaNYL (SUBLIMAZE) injection, labetalol, morphine injection, nitroGLYCERIN, ondansetron (ZOFRAN) IV, oxyCODONE-acetaminophen, sodium chloride flush, sodium chloride flush    Filed Vitals:   03/18/16 0700 03/18/16 0800 03/18/16 0900 03/18/16 1000  BP: 145/85 148/74 157/88 144/65  Pulse: 78 78 84 84  Temp:  97.2 F (36.2 C)    TempSrc:  Oral    Resp: 16 14 17 21   Height:      Weight:      SpO2: 100% 95% 98% 97%    Intake/Output Summary (Last 24 hours) at 03/18/16 1037 Last data filed at 03/18/16 0900  Gross per 24 hour  Intake 797.18 ml  Output    720 ml  Net  77.18 ml    LABS: Basic Metabolic Panel:  Recent Labs  03/17/16 2014  NA 135  K 4.2  CL 101  CO2 24  GLUCOSE 191*  BUN 18  CREATININE 0.83  CALCIUM 9.5   Liver Function Tests: No results for input(s): AST, ALT, ALKPHOS, BILITOT, PROT, ALBUMIN in the last 72 hours. No results for input(s): LIPASE, AMYLASE in the last 72 hours. CBC:  Recent Labs  03/17/16 2014  WBC 11.4*  HGB 12.0  HCT 37.7  MCV 81.5  PLT 331   Cardiac Enzymes:  Recent Labs  03/17/16 2014 03/18/16 0158  TROPONINI 0.05* 2.71*   BNP: Invalid input(s): POCBNP D-Dimer: No results  for input(s): DDIMER in the last 72 hours. Hemoglobin A1C: No results for input(s): HGBA1C in the last 72 hours. Fasting Lipid Panel: No results for input(s): CHOL, HDL, LDLCALC, TRIG, CHOLHDL, LDLDIRECT in the last 72 hours. Thyroid Function Tests:  Recent Labs  03/18/16 0158  TSH 0.589   Anemia Panel: No results for input(s): VITAMINB12, FOLATE, FERRITIN, TIBC, IRON, RETICCTPCT in the last 72 hours.  RADIOLOGY: Dg Chest 2 View  03/17/2016  CLINICAL DATA:  Chest pain radiating into the left arm, shortness of Breath EXAM: CHEST  2 VIEW COMPARISON:  None. FINDINGS: Cardiac shadow is within normal limits. Mild central vascular congestion is seen with mild interstitial edema. No focal infiltrate or sizable effusion is seen. No bony abnormality is noted. IMPRESSION: Mild CHF. Electronically Signed   By: Inez Catalina M.D.   On: 03/17/2016 20:48    PHYSICAL EXAM General: NAD Neck: JVP 10-12 cm, no thyromegaly or thyroid nodule.  Lungs: Clear to auscultation bilaterally with normal respiratory effort. CV: Nondisplaced PMI.  Heart regular S1/S2, no S3/S4, no murmur.  No peripheral edema.   Abdomen: Soft, nontender, no hepatosplenomegaly, no distention.  Neurologic: Alert and oriented x 3.  Psych: Normal affect. Extremities:  No clubbing or cyanosis.   TELEMETRY: Reviewed telemetry pt in NSR, LBBB  ASSESSMENT AND PLAN: 65 yo with history of HTN and DM presented with anterolateral STEMI (LBBB with concordant STE in V4).  She is now s/p DES x 2 to LAD.  1. CAD: Anterolateral STEMI with concordant STE in V4.  This has resolved on this morning's ECG.  She had serial 80% stenoses in the LAD treated with DES x 2.  - Mild chest pain earlier today may be due to some downstream showering of plaque post-PCI.  Now CP-free.  Can continue NTG gtt for now, titrate down.  - Continue ASA 81, ticagrelor, atorvastatin 80, Coreg.   - Add back home lisinopril, plenty of BP room.  2. Diabetes: Cover with  sliding scale insulin, hold home metformin another day.  3. Acute CHF: EF around 45% on LV-gram with cath.  LVEDP 37, she is volume overloaded on exam today.  - Needs formal echo.  - Lasix 40 mg IV bid today + K.  - Needs BMET.  - Continue Coreg, restart lisinopril 20 daily.    Out of bed.    Loralie Champagne 03/18/2016 10:43 AM

## 2016-03-18 NOTE — Progress Notes (Signed)
At times, pulse ox reading is in the 80's on the right thumb, compared to left ear lobe which will read 98 %. Hands with black gel nail beds, unable to remove polish for accurate reading. No distress noted with patient and right with strong palpable pulse

## 2016-03-18 NOTE — Progress Notes (Signed)
  Echocardiogram 2D Echocardiogram has been performed.  Aggie Cosier 03/18/2016, 2:52 PM

## 2016-03-19 ENCOUNTER — Encounter (HOSPITAL_COMMUNITY): Payer: Self-pay | Admitting: Cardiovascular Disease

## 2016-03-19 DIAGNOSIS — I2109 ST elevation (STEMI) myocardial infarction involving other coronary artery of anterior wall: Secondary | ICD-10-CM | POA: Diagnosis not present

## 2016-03-19 DIAGNOSIS — I25110 Atherosclerotic heart disease of native coronary artery with unstable angina pectoris: Secondary | ICD-10-CM

## 2016-03-19 DIAGNOSIS — I255 Ischemic cardiomyopathy: Secondary | ICD-10-CM

## 2016-03-19 LAB — BASIC METABOLIC PANEL
Anion gap: 7 (ref 5–15)
BUN: 16 mg/dL (ref 6–20)
CALCIUM: 9.2 mg/dL (ref 8.9–10.3)
CO2: 26 mmol/L (ref 22–32)
CREATININE: 0.87 mg/dL (ref 0.44–1.00)
Chloride: 104 mmol/L (ref 101–111)
GFR calc non Af Amer: >60 mL/min (ref >60)
Glucose, Bld: 142 mg/dL — ABNORMAL HIGH (ref 65–99)
Potassium: 3.9 mmol/L (ref 3.5–5.1)
SODIUM: 137 mmol/L (ref 135–145)

## 2016-03-19 LAB — CBC
HCT: 31.1 % — ABNORMAL LOW (ref 36.0–46.0)
Hemoglobin: 10.1 g/dL — ABNORMAL LOW (ref 12.0–15.0)
MCH: 26.4 pg (ref 26.0–34.0)
MCHC: 32.5 g/dL (ref 30.0–36.0)
MCV: 81.4 fL (ref 78.0–100.0)
PLATELETS: 305 10*3/uL (ref 150–400)
RBC: 3.82 MIL/uL — AB (ref 3.87–5.11)
RDW: 14.6 % (ref 11.5–15.5)
WBC: 8.7 10*3/uL (ref 4.0–10.5)

## 2016-03-19 LAB — GLUCOSE, CAPILLARY
GLUCOSE-CAPILLARY: 112 mg/dL — AB (ref 65–99)
Glucose-Capillary: 160 mg/dL — ABNORMAL HIGH (ref 65–99)
Glucose-Capillary: 130 mg/dL — ABNORMAL HIGH (ref 65–99)
Glucose-Capillary: 158 mg/dL — ABNORMAL HIGH (ref 65–99)

## 2016-03-19 LAB — HEMOGLOBIN A1C
HEMOGLOBIN A1C: 6.2 % — AB (ref 4.8–5.6)
MEAN PLASMA GLUCOSE: 131 mg/dL

## 2016-03-19 LAB — POCT ACTIVATED CLOTTING TIME
ACTIVATED CLOTTING TIME: 224 s
ACTIVATED CLOTTING TIME: 317 s

## 2016-03-19 MED ORDER — NITROGLYCERIN IN D5W 200-5 MCG/ML-% IV SOLN
10.0000 ug/min | INTRAVENOUS | Status: DC
  Administered 2016-03-19: 10 ug/min via INTRAVENOUS

## 2016-03-19 MED ORDER — NITROGLYCERIN IN D5W 200-5 MCG/ML-% IV SOLN
INTRAVENOUS | Status: AC
  Filled 2016-03-19: qty 250

## 2016-03-19 MED FILL — Heparin Sodium (Porcine) 2 Unit/ML in Sodium Chloride 0.9%: INTRAMUSCULAR | Qty: 1000 | Status: AC

## 2016-03-19 MED FILL — Tirofiban HCl in NaCl 0.9% IV Soln 5 MG/100ML (Base Equiv): INTRAVENOUS | Qty: 100 | Status: AC

## 2016-03-19 MED FILL — Heparin Sodium (Porcine) 2 Unit/ML in Sodium Chloride 0.9%: INTRAMUSCULAR | Qty: 2000 | Status: CN

## 2016-03-19 MED FILL — Tirofiban HCl in NaCl 0.9% IV Soln 5 MG/100ML (Base Equiv): INTRAVENOUS | Qty: 100 | Status: CN

## 2016-03-19 NOTE — Progress Notes (Signed)
Patient complained of chest pain. Nitro sublingual x3 was given. Pain went from a 8 to a 5. EKG was done. Physician was paged and nitro drip was started. Will continue to monitor.

## 2016-03-19 NOTE — Progress Notes (Signed)
Pt with recurrent chest pain.  EKG with no acute changes with LBBB.  IV Ntg  Has improved pain and it is resolving.  No change in exam.  Dr. Angelena Form has seen.

## 2016-03-19 NOTE — Progress Notes (Signed)
Called by nursing to see pt. Pt with onset of chest pain. EKG with persistent LBBB. Chest pain relieved with SL NTG. Will start NTG drip. No other changes at this time. Pt appears comfortable.   Lauree Chandler 03/19/2016 3:15 PM

## 2016-03-19 NOTE — Progress Notes (Addendum)
CARDIAC REHAB PHASE I   PRE:  Rate/Rhythm: 96 SR  BP:  Sitting: 143/66        SaO2: 95 RA  MODE:  Ambulation: 350 ft   POST:  Rate/Rhythm: 112 ST  BP:  Sitting: 146/58         SaO2: 98 RA  Pt ambulated 350 ft on RA, IV, handheld assist, steady gait, tolerated well.  Pt c/o mild dizziness, denies any other complaints, declined rest stop. Began MI/stent/CHF education.  Reviewed risk factors, anti-platelet therapy, stent card, activity restrictions, ntg, CHF booklet and zone tool, daily weights, fluid and sodium restrictions, and basic life vest information. Left heart healthy, diabetes, and low sodium diet handouts as well as phase 2 cardiac rehab brocure for pt to review. Pt verbalized understanding, very receptive but states she is very overwhelmed. Will need to review exercise guidelines, phase 2 cardiac rehab and diet information tomorrow. Pt agreeable. Pt to recliner after walk, feet elevated, call bell within reach. Will follow.  [id number] Lenna Sciara, RN, BSN 03/19/2016 9:41 AM

## 2016-03-19 NOTE — Care Management Note (Signed)
Case Management Note  Patient Details  Name: Theta Verzosa MRN: [id number] Date of Birth: 05-Apr-1951  Subjective/Objective:    Adm w mi                Action/Plan: lives w husband, pcp dr Lacinda Axon   Expected Discharge Date:                  Expected Discharge Plan:  Home/Self Care  In-House Referral:     Discharge planning Services  CM Consult, Medication Assistance  Post Acute Care Choice:    Choice offered to:     DME Arranged:    DME Agency:     HH Arranged:    [place] Agency:     Status of Service:     Medicare Important Message Given:    Date Medicare IM Given:    Medicare IM give by:    Date Additional Medicare IM Given:    Additional Medicare Important Message give by:     If discussed at [place] of Stay Meetings, dates discussed:    Additional Comments: gave pt 30day free brilinta card. States has Freight forwarder for meds  Lacretia Leigh, RN 03/19/2016, 9:30 AM

## 2016-03-19 NOTE — Progress Notes (Signed)
SUBJECTIVE: Several episodes of mild chest pressure but this only lasted for seconds. No pain this am. She does c/o dyspnea.   Tele: sinus  BP 143/75 mmHg  Pulse 89  Temp(Src) 97.8 F (36.6 C) (Oral)  Resp 16  Ht 5\' 3"  (1.6 m)  Wt 175 lb 7.8 oz (79.6 kg)  BMI 31.09 kg/m2  SpO2 98%  Intake/Output Summary (Last 24 hours) at 03/19/16 0809 Last data filed at 03/19/16 0700  Gross per 24 hour  Intake    940 ml  Output    845 ml  Net     95 ml    PHYSICAL EXAM General: Well developed, well nourished, in no acute distress. Alert and oriented x 3.  Psych:  Good affect, responds appropriately Neck: + JVD. No masses noted.  Lungs: Clear bilaterally with no wheezes or rhonci noted.  Heart: RRR with no murmurs noted. Abdomen: Bowel sounds are present. Soft, non-tender.  Extremities: No lower extremity edema.   LABS: Basic Metabolic Panel:  Recent Labs  03/18/16 1035 03/19/16 0157  NA 137 137  K 3.6 3.9  CL 103 104  CO2 20* 26  GLUCOSE 141* 142*  BUN 11 16  CREATININE 0.90 0.87  CALCIUM 9.1 9.2   CBC:  Recent Labs  03/18/16 1035 03/19/16 0157  WBC 12.4* 8.7  HGB 10.6* 10.1*  HCT 33.6* 31.1*  MCV 82.0 81.4  PLT 311 305   Cardiac Enzymes:  Recent Labs  03/17/16 2014 03/18/16 0158 03/18/16 1035  TROPONINI 0.05* 2.71* 2.30*   Fasting Lipid Panel:  Recent Labs  03/18/16 1035  CHOL 227*  HDL 34*  LDLCALC 171*  TRIG 112  CHOLHDL 6.7    Current Meds: . aspirin EC  81 mg Oral Daily  . atorvastatin  80 mg Oral q1800  . carvedilol  6.25 mg Oral BID WC  . enoxaparin (LOVENOX) injection  40 mg Subcutaneous Q24H  . furosemide  40 mg Intravenous BID  . insulin aspart  0-15 Units Subcutaneous TID WC  . lisinopril  20 mg Oral Daily  . pneumococcal 23 valent vaccine  0.5 mL Intramuscular Tomorrow-1000  . sodium chloride flush  3 mL Intravenous Q12H  . sodium chloride flush  3 mL Intravenous Q12H  . ticagrelor  90 mg Oral BID   Echo 03/18/16: Left  ventricle: The cavity size was normal. Wall thickness was  normal. Systolic function was moderately to severely reduced. The  estimated ejection fraction was in the range of 30% to 35%.  Akinesis of the entireanteroseptal myocardium; consistent with  infarction. Doppler parameters are consistent with abnormal left  ventricular relaxation (grade 1 diastolic dysfunction). - Mitral valve: Mildly calcified annulus. There was mild  regurgitation. - Left atrium: The atrium was severely dilated.   ASSESSMENT AND PLAN: 65 yo female with history of HTN and DM presented with anterolateral STEMI (LBBB with concordant STE in V4). She is now s/p DES x 2 to LAD.   1. CAD/STEMI: Anterolateral STEMI with serial 80% stenoses in the LAD. This was treated with DES x 2. No other obstructive disease. She is stable this am. Continue ASA, Brilinta, statin, beta blocker, Ace-inh.   2. Diabetes mellitus: Continue to cover with sliding scale insulin. Restart metformin tomorrow.    3. Ischemic cardiomyopathy/Acute systolic CHF: LVEF [id number] on echo 03/18/16. She is still volume overloaded on exam. Will continue IV Lasix today. Continue Coreg and Lisinopril. Will ask our [place] to come by to begin  process of [facility] for discharge.   Will transfer to telemetry unit. Out of bed and ambulate today.       Lauree Chandler  5/15/20178:09 AM

## 2016-03-20 ENCOUNTER — Inpatient Hospital Stay (HOSPITAL_COMMUNITY): Payer: Medicare Other

## 2016-03-20 LAB — BASIC METABOLIC PANEL
ANION GAP: 13 (ref 5–15)
BUN: 20 mg/dL (ref 6–20)
CALCIUM: 9.6 mg/dL (ref 8.9–10.3)
CO2: 22 mmol/L (ref 22–32)
Chloride: 105 mmol/L (ref 101–111)
Creatinine, Ser: 0.77 mg/dL (ref 0.44–1.00)
GLUCOSE: 117 mg/dL — AB (ref 65–99)
Potassium: 3.9 mmol/L (ref 3.5–5.1)
SODIUM: 140 mmol/L (ref 135–145)

## 2016-03-20 LAB — CBC
HCT: 35.1 % — ABNORMAL LOW (ref 36.0–46.0)
HEMOGLOBIN: 11.1 g/dL — AB (ref 12.0–15.0)
MCH: 26.1 pg (ref 26.0–34.0)
MCHC: 31.6 g/dL (ref 30.0–36.0)
MCV: 82.6 fL (ref 78.0–100.0)
Platelets: 262 10*3/uL (ref 150–400)
RBC: 4.25 MIL/uL (ref 3.87–5.11)
RDW: 14.6 % (ref 11.5–15.5)
WBC: 8.1 10*3/uL (ref 4.0–10.5)

## 2016-03-20 LAB — GLUCOSE, CAPILLARY
GLUCOSE-CAPILLARY: 114 mg/dL — AB (ref 65–99)
GLUCOSE-CAPILLARY: 89 mg/dL (ref 65–99)
GLUCOSE-CAPILLARY: 108 mg/dL — AB (ref 65–99)
Glucose-Capillary: 106 mg/dL — ABNORMAL HIGH (ref 65–99)

## 2016-03-20 IMAGING — DX DG CHEST 2V
2 series · 2 of 2 positions shown · non-contrast
Comparison: [DATE]

CLINICAL DATA: Sharp right-sided chest pain and shortness of breath
today.

EXAM:
CHEST  2 VIEW

[chest pa]
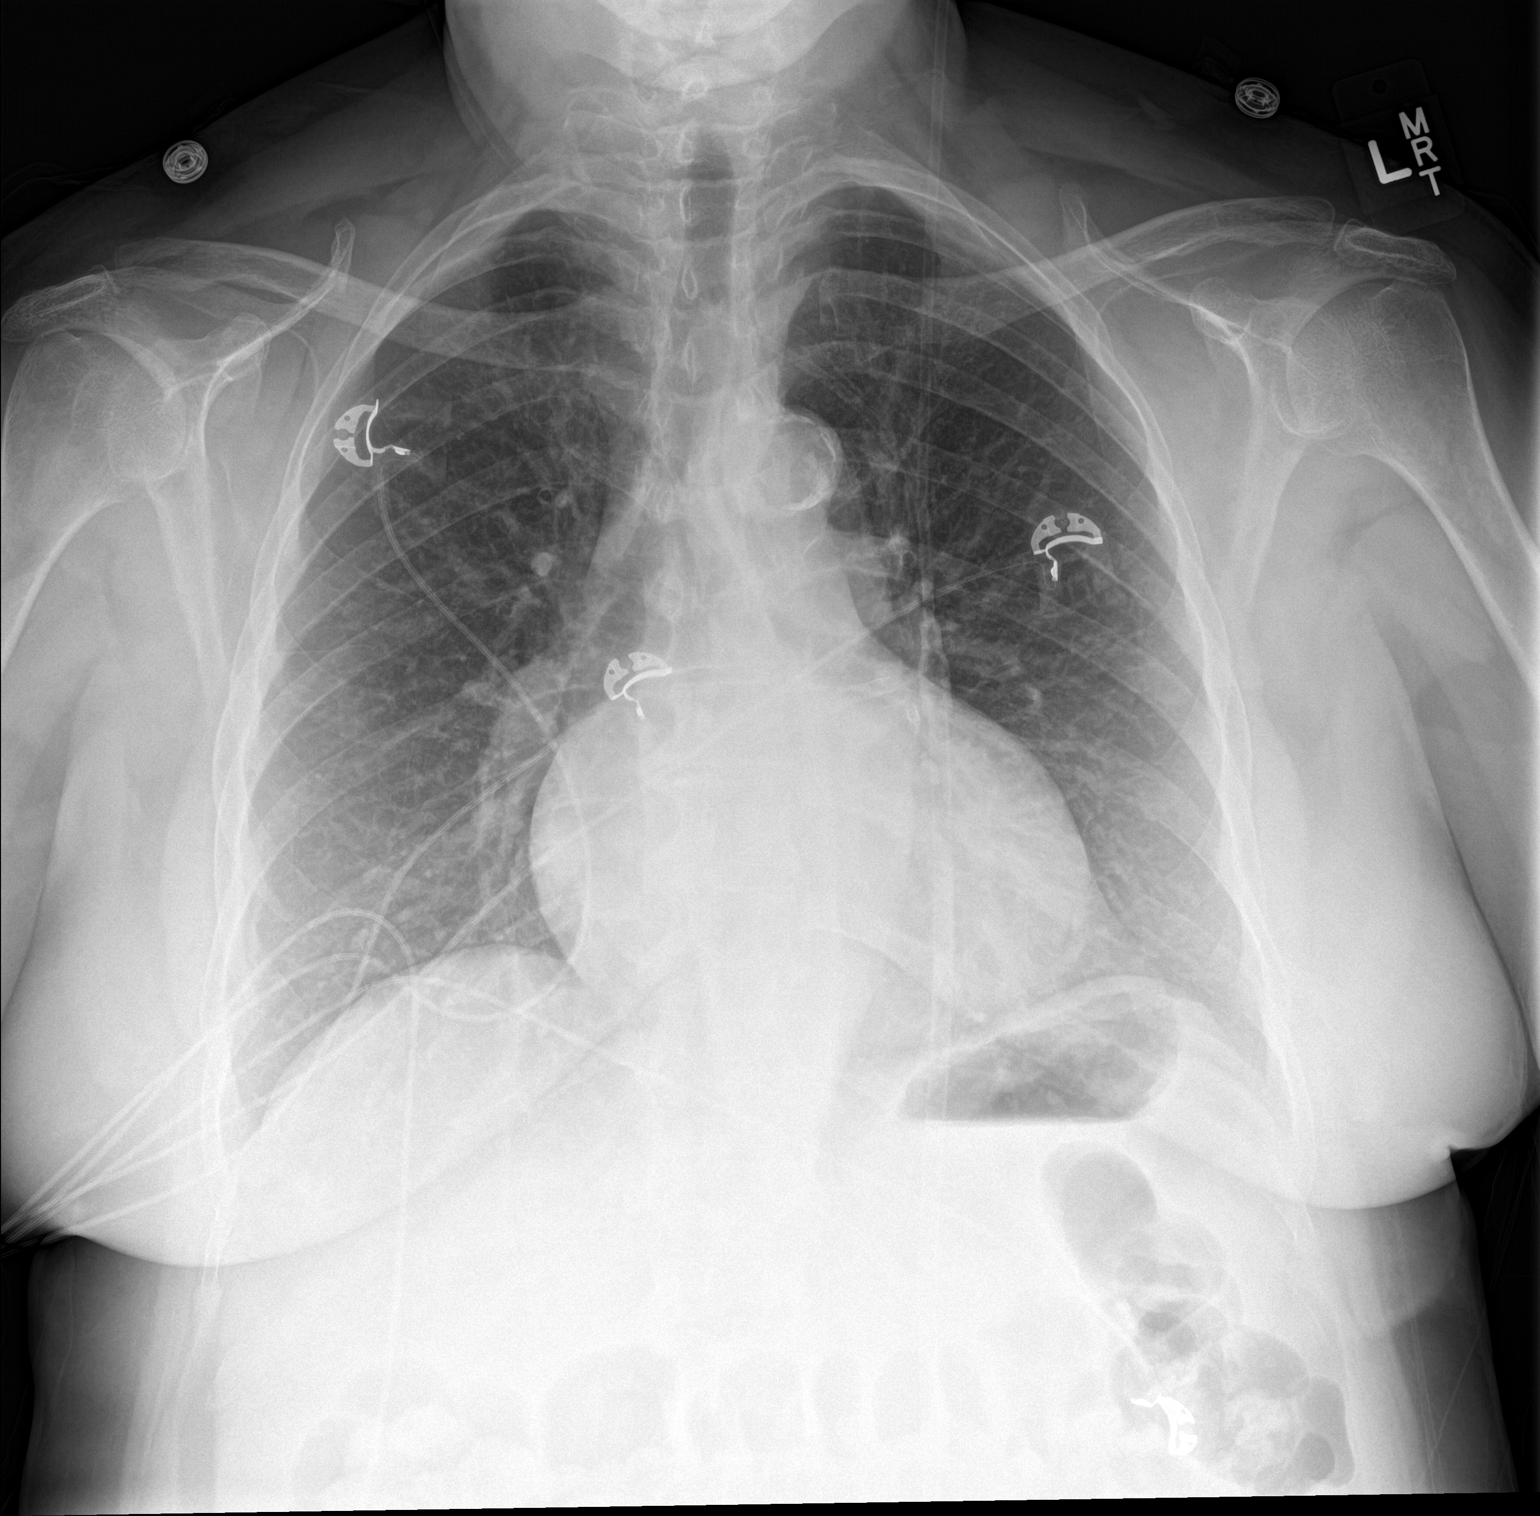

[chest lat]
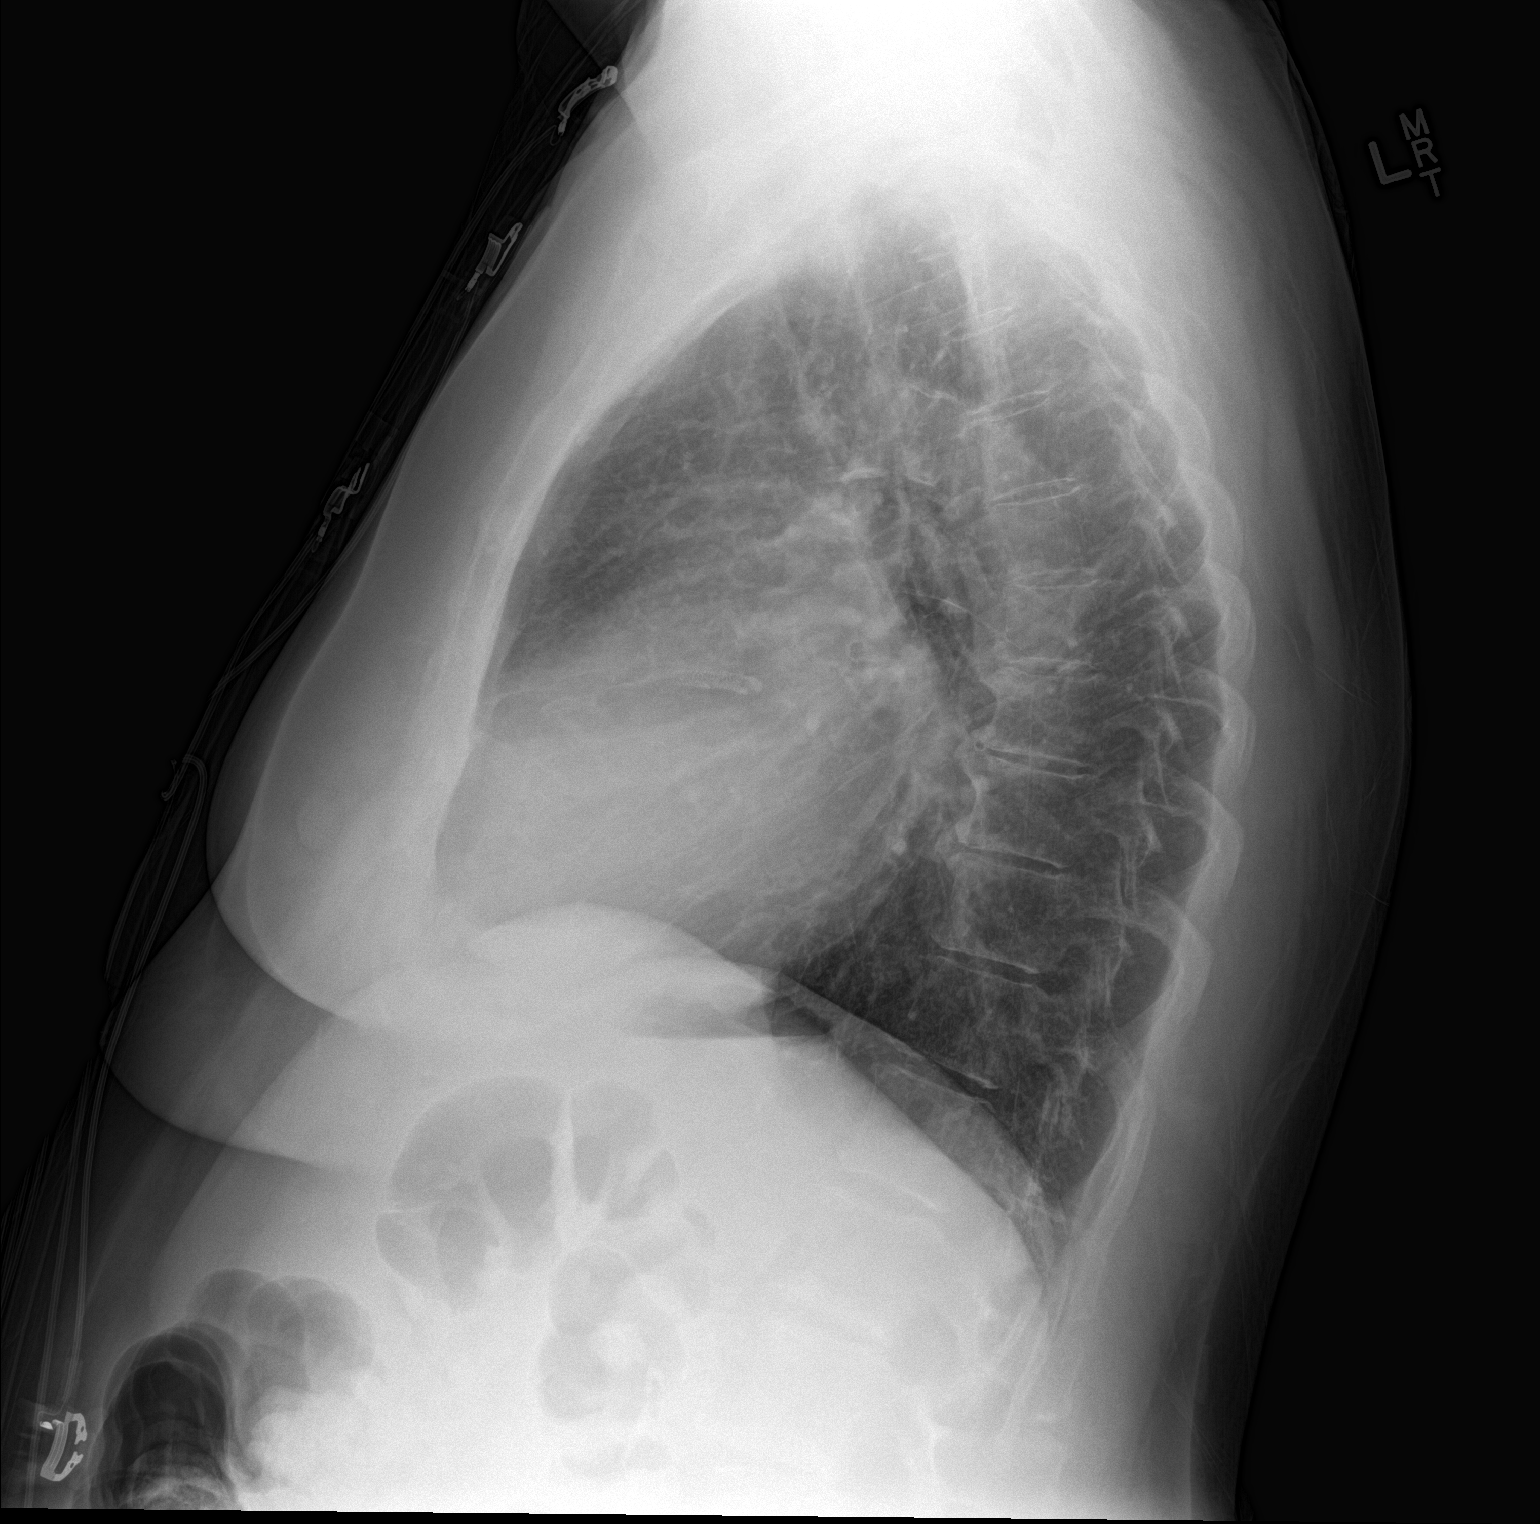

[2 of 2 positions shown; findings below may reference images not displayed]

FINDINGS: Cardiac silhouette is mildly enlarged. There are coronary artery
calcifications and a stent. No mediastinal or hilar masses or
evidence of adenopathy.

Lungs are clear.  No pleural effusion or pneumothorax.

Bony thorax is demineralized but intact.
IMPRESSION: No acute cardiopulmonary disease.

## 2016-03-20 MED ORDER — METFORMIN HCL 500 MG PO TABS
500.0000 mg | ORAL_TABLET | Freq: Two times a day (BID) | ORAL | Status: DC
  Administered 2016-03-20 – 2016-03-22 (×4): 500 mg via ORAL
  Filled 2016-03-20 (×4): qty 1

## 2016-03-20 MED ORDER — ISOSORBIDE MONONITRATE ER 30 MG PO TB24
30.0000 mg | ORAL_TABLET | Freq: Every day | ORAL | Status: DC
  Administered 2016-03-20 – 2016-03-22 (×3): 30 mg via ORAL
  Filled 2016-03-20 (×3): qty 1

## 2016-03-20 MED ORDER — DIAZEPAM 5 MG PO TABS
5.0000 mg | ORAL_TABLET | Freq: Two times a day (BID) | ORAL | Status: DC | PRN
  Administered 2016-03-21: 5 mg via ORAL
  Filled 2016-03-20: qty 1

## 2016-03-20 MED ORDER — VENLAFAXINE HCL ER 75 MG PO CP24
150.0000 mg | ORAL_CAPSULE | Freq: Every day | ORAL | Status: DC
  Administered 2016-03-20 – 2016-03-22 (×3): 150 mg via ORAL
  Filled 2016-03-20 (×3): qty 2

## 2016-03-20 MED FILL — Fentanyl Citrate Preservative Free (PF) Inj 100 MCG/2ML: INTRAMUSCULAR | Qty: 2 | Status: AC

## 2016-03-20 NOTE — Progress Notes (Signed)
S/W [facility] @ [employer] [facility] # [phone]   BRILINTA  90 MG BID ( 30 )   COVER- YES  CO-PAY- $332.00 DEDUCTIBLE OF $400.00 ONLY MET $ 50.00  WHEN MET WILL PAY PATIENT WILL PAY 20 % = $66.00  TIER- 3 DRUG  PRIOR APPROVAL -NO  PHARMACY : [facility]   Checked on copay for brilinta and this is what cm sec found for copay

## 2016-03-20 NOTE — Progress Notes (Signed)
Patient Profile: 65 yo female with history of HTN and DM presented with anterolateral STEMI (LBBB with concordant STE in V4). She is now s/p DES x 2 to LAD.   Subjective: Currently CP free however patient had an episode of recurrent CP yesterday afternoon, requiring restart of IV nitro. She also had mild dyspnea overnight and had to restart supplemental oxygen.   Objective: Vital signs in last 24 hours: Temp:  [97.7 F (36.5 C)-98.2 F (36.8 C)] 97.7 F (36.5 C) (05/16 0451) Pulse Rate:  [68-90] 68 (05/16 0812) Resp:  [18] 18 (05/16 0812) BP: (122-176)/(51-77) 145/72 mmHg (05/16 0812) SpO2:  [98 %-100 %] 99 % (05/16 0451) Weight:  [175 lb 3.2 oz (79.47 kg)] 175 lb 3.2 oz (79.47 kg) (05/16 0451) Last BM Date: 03/18/16  Intake/Output from previous day: 05/15 0701 - 05/16 0700 In: 425.8 [P.O.:320; I.V.:105.8] Out: 300 [Urine:300] Intake/Output this shift: Total I/O In: 240 [P.O.:240] Out: -   Medications Current Facility-Administered Medications  Medication Dose Route Frequency Provider Last Rate Last Dose  . 0.9 %  sodium chloride infusion  250 mL Intravenous PRN Sherren Mocha, MD 10 mL/hr at 03/18/16 0500 250 mL at 03/18/16 0500  . 0.9 %  sodium chloride infusion  250 mL Intravenous PRN Sherren Mocha, MD      . acetaminophen (TYLENOL) tablet 650 mg  650 mg Oral Q4H PRN Sherren Mocha, MD      . aspirin EC tablet 81 mg  81 mg Oral Daily Sherren Mocha, MD   81 mg at 03/19/16 [id number]  . atorvastatin (LIPITOR) tablet 80 mg  80 mg Oral q1800 Sherren Mocha, MD   80 mg at 03/19/16 1718  . carvedilol (COREG) tablet 6.25 mg  6.25 mg Oral BID WC Sherren Mocha, MD   6.25 mg at 03/20/16 [id number]  . enoxaparin (LOVENOX) injection 40 mg  40 mg Subcutaneous Q24H Sherren Mocha, MD   40 mg at 03/19/16 1021  . furosemide (LASIX) injection 40 mg  40 mg Intravenous BID Larey Dresser, MD   40 mg at 03/20/16 [id number]  . insulin aspart (novoLOG) injection 0-15 Units  0-15 Units Subcutaneous TID WC  Sherren Mocha, MD   2 Units at 03/19/16 1717  . labetalol (NORMODYNE,TRANDATE) injection 20 mg  20 mg Intravenous Q4H PRN Sherren Mocha, MD   20 mg at 03/18/16 0506  . lisinopril (PRINIVIL,ZESTRIL) tablet 20 mg  20 mg Oral Daily Larey Dresser, MD   20 mg at 03/19/16 [id number]  . morphine 2 MG/ML injection 2 mg  2 mg Intravenous Q1H PRN Sherren Mocha, MD      . nitroGLYCERIN (NITROSTAT) SL tablet 0.4 mg  0.4 mg Sublingual Q5 Min x 3 PRN Sherren Mocha, MD   0.4 mg at 03/19/16 1442  . nitroGLYCERIN 50 mg in dextrose 5 % 250 mL (0.2 mg/mL) infusion  10 mcg/min Intravenous Continuous Burnell Blanks, MD 3 mL/hr at 03/19/16 1623 10 mcg/min at 03/19/16 1623  . ondansetron (ZOFRAN) injection 4 mg  4 mg Intravenous Q6H PRN Sherren Mocha, MD      . oxyCODONE-acetaminophen (PERCOCET/ROXICET) 5-325 MG per tablet 1-2 tablet  1-2 tablet Oral Q4H PRN Sherren Mocha, MD   2 tablet at 03/20/16 0035  . sodium chloride flush (NS) 0.9 % injection 3 mL  3 mL Intravenous Q12H Sherren Mocha, MD   3 mL at 03/19/16 1000  . sodium chloride flush (NS) 0.9 % injection 3 mL  3 mL Intravenous PRN Sherren Mocha,  MD      . sodium chloride flush (NS) 0.9 % injection 3 mL  3 mL Intravenous Q12H Sherren Mocha, MD   3 mL at 03/19/16 1000  . sodium chloride flush (NS) 0.9 % injection 3 mL  3 mL Intravenous PRN Sherren Mocha, MD      . ticagrelor [facility]) tablet 90 mg  90 mg Oral BID Sherren Mocha, MD   90 mg at 03/19/16 2119    PE: General appearance: alert, cooperative, no distress and moderately obese Neck: no carotid bruit and no JVD Lungs: clear to auscultation bilaterally Heart: regular rate and rhythm, S1, S2 normal, no murmur, click, rub or gallop Extremities: no LEE Pulses: 2+ and symmetric Skin: warm and dry Neurologic: Grossly normal  Lab Results:   Recent Labs  03/18/16 1035 03/19/16 0157 03/20/16 0441  WBC 12.4* 8.7 8.1  HGB 10.6* 10.1* 11.1*  HCT 33.6* 31.1* 35.1*  PLT 311 305 262    BMET  Recent Labs  03/18/16 1035 03/19/16 0157 03/20/16 0441  NA 137 137 140  K 3.6 3.9 3.9  CL 103 104 105  CO2 20* 26 22  GLUCOSE 141* 142* 117*  BUN 11 16 20   CREATININE 0.90 0.87 0.77  CALCIUM 9.1 9.2 9.6   PT/INR  Recent Labs  03/17/16 2014  LABPROT 12.7  INR 0.93   Cholesterol  Recent Labs  03/18/16 1035  CHOL 227*   Cardiac Panel (last 3 results)  Recent Labs  03/17/16 2014 03/18/16 0158 03/18/16 1035  TROPONINI 0.05* 2.71* 2.30*    Studies/Results: 2D Echo 03/18/16 Study Conclusions  - Left ventricle: The cavity size was normal. Wall thickness was  normal. Systolic function was moderately to severely reduced. The  estimated ejection fraction was in the range of 30% to 35%.  Akinesis of the entireanteroseptal myocardium; consistent with  infarction. Doppler parameters are consistent with abnormal left  ventricular relaxation (grade 1 diastolic dysfunction). - Mitral valve: Mildly calcified annulus. There was mild  regurgitation. - Left atrium: The atrium was severely dilated.  Assessment/Plan  Active Problems:   ST elevation myocardial infarction (STEMI) of anterior wall (HCC)   Acute ST elevation myocardial infarction (STEMI) involving left anterior descending coronary artery (HCC)  1. CAD/STEMI: Anterolateral STEMI with serial 80% stenoses in the LAD. This was treated with DES x 2. No other obstructive disease. She had recurrent CP yesterday afternoon and required IV nitro. Now CP free. Continue ASA, Brilinta, statin, beta blocker, Ace-in. Transition from IV nitro to PO LA nitrate. Try to wean patient off of supplemental O2. Work with cardiac rehab today.   2. Diabetes mellitus: Continue to cover with sliding scale insulin. Renal function is stable. Can restart metformin today.  3. Ischemic cardiomyopathy/Acute systolic CHF: LVEF [id number] on echo 03/18/16. Volume appears stable today. Consider transition from IV lasix to PO. Continue  Coreg and Lisinopril. LifeVest rep to stop by later today to assist with LifeVest.   Dispo: possible d/c home in the next 24-48 hrs.     LOS: 2 days    Brittainy M. Ladoris Gene 03/20/2016 9:19 AM   I have personally seen and examined this patient with Lyda Jester, PA-C. I agree with the assessment and plan as outlined above. She is s/p STEMI with severe disease in the LAD. 2 DES were placed in the LAD. She does have severe LV systolic dysfunction post cath. LVEF is under 35%. Will need Lifevest before discharge. We have started this process. Continue current meds  including ASA and Brilinta, beta blocker, Ace-inh and statin. Will start long acting nitrate today. D/c IV NTG. She is still on IV lasix and appears to have mild residual volume overload. Will continue IV Lasix today. Will get chest x-ray today. I suspect she will need 24-48 more hours hospitalization. I think some of her chest pain events are related to anxiety.   Lauree Chandler 03/20/2016 11:00 AM

## 2016-03-20 NOTE — Progress Notes (Signed)
CARDIAC REHAB PHASE I   PRE:  Rate/Rhythm: 78 SR  BP:  Sitting: 138/69        SaO2: 100 RA  MODE:  Ambulation: 250 ft   POST:  Rate/Rhythm: 83 SR  BP:  Sitting: 139/59         SaO2: 100 RA  Third attempt to ambulate with pt. Upon entering room, pt pacing, reports feeling dizzy, just finished washing up, on oxygen. Pt concerned that she has had chest pain, very anxious, states she feels overwhelmed, almost hyperventaliting. Pt states she became short of breath off oxygen and had chest pain. Pt states she has a history of a "nervous breakdown" with suicide attempts, takes effexor daily, states she missed several doses of her medication. Pt tearful, states she does not now how she is going to "move forward." Emotional support given to pt. Offered chaplain services, pt declined. Pt agreeable to walk after talking. Pt ambulated 250 ft on 2LO2, hand held assist, steady gait, tolerated well. Pt did c/o DOE, otherwise did well.  Pt agrees to phase 2 cardiac rehab referral, will send to [facility] per pt request. Will discuss diet and exercise at a later time.  Pt to bed per pt request after walk, call bell within reach. Will follow.    [place], RN, BSN 03/20/2016 2:41 PM

## 2016-03-21 ENCOUNTER — Telehealth: Payer: Self-pay | Admitting: Cardiovascular Disease

## 2016-03-21 DIAGNOSIS — I5022 Chronic systolic (congestive) heart failure: Secondary | ICD-10-CM

## 2016-03-21 HISTORY — DX: Chronic systolic (congestive) heart failure: I50.22

## 2016-03-21 LAB — GLUCOSE, CAPILLARY
GLUCOSE-CAPILLARY: 117 mg/dL — AB (ref 65–99)
GLUCOSE-CAPILLARY: 96 mg/dL (ref 65–99)
GLUCOSE-CAPILLARY: 100 mg/dL — AB (ref 65–99)
Glucose-Capillary: 98 mg/dL (ref 65–99)

## 2016-03-21 LAB — BASIC METABOLIC PANEL
ANION GAP: 10 (ref 5–15)
BUN: 22 mg/dL — ABNORMAL HIGH (ref 6–20)
CHLORIDE: 105 mmol/L (ref 101–111)
CO2: 25 mmol/L (ref 22–32)
Calcium: 9.3 mg/dL (ref 8.9–10.3)
Creatinine, Ser: 0.89 mg/dL (ref 0.44–1.00)
GFR calc non Af Amer: >60 mL/min (ref >60)
Glucose, Bld: 121 mg/dL — ABNORMAL HIGH (ref 65–99)
Potassium: 3.9 mmol/L (ref 3.5–5.1)
Sodium: 140 mmol/L (ref 135–145)

## 2016-03-21 MED ORDER — CARVEDILOL 6.25 MG PO TABS: 6.2500 mg | ORAL_TABLET | Freq: Two times a day (BID) | ORAL | Status: DC

## 2016-03-21 MED ORDER — NITROGLYCERIN 0.4 MG SL SUBL: 0.4000 mg | SUBLINGUAL_TABLET | SUBLINGUAL | Status: DC | PRN

## 2016-03-21 MED ORDER — FUROSEMIDE 40 MG PO TABS
40.0000 mg | ORAL_TABLET | Freq: Every day | ORAL | Status: DC
  Administered 2016-03-21 – 2016-03-22 (×2): 40 mg via ORAL
  Filled 2016-03-21 (×2): qty 1

## 2016-03-21 MED ORDER — TICAGRELOR 90 MG PO TABS: 90.0000 mg | ORAL_TABLET | Freq: Two times a day (BID) | ORAL | Status: DC

## 2016-03-21 MED ORDER — ISOSORBIDE MONONITRATE ER 30 MG PO TB24: 30.0000 mg | ORAL_TABLET | Freq: Every day | ORAL | Status: DC

## 2016-03-21 MED ORDER — FUROSEMIDE 40 MG PO TABS: 40.0000 mg | ORAL_TABLET | Freq: Every day | ORAL | Status: DC

## 2016-03-21 MED ORDER — ATORVASTATIN CALCIUM 80 MG PO TABS: 80.0000 mg | ORAL_TABLET | Freq: Every day | ORAL | Status: DC

## 2016-03-21 MED ORDER — ASPIRIN 81 MG PO TBEC: 81.0000 mg | DELAYED_RELEASE_TABLET | Freq: Every day | ORAL | Status: DC

## 2016-03-21 NOTE — Progress Notes (Signed)
Patient Profile: 65 yo female with history of HTN and DM presented with anterolateral STEMI (LBBB with concordant STE in V4). She is now s/p DES x 2 to LAD.   Subjective: CP is improved but still with subjective dyspnea and using supplemental O2. She admits to high stress/ anxiety recently. She missed several doses of her Effexor. This has been restarted.    Objective: Vital signs in last 24 hours: Temp:  [98.2 F (36.8 C)-98.9 F (37.2 C)] 98.2 F (36.8 C) (05/17 0519) Pulse Rate:  [73-83] 77 (05/17 0519) Resp:  [18-20] 18 (05/17 0519) BP: (128-148)/(60-73) 148/70 mmHg (05/17 0519) SpO2:  [100 %] 100 % (05/17 0519) Weight:  [174 lb 9.6 oz (79.198 kg)] 174 lb 9.6 oz (79.198 kg) (05/17 0519) Last BM Date: 03/18/16  Intake/Output from previous day: 05/16 0701 - 05/17 0700 In: 720 [P.O.:720] Out: 800 [Urine:800] Intake/Output this shift:    Medications Current Facility-Administered Medications  Medication Dose Route Frequency Provider Last Rate Last Dose  . 0.9 %  sodium chloride infusion  250 mL Intravenous PRN Sherren Mocha, MD 10 mL/hr at 03/18/16 0500 250 mL at 03/18/16 0500  . 0.9 %  sodium chloride infusion  250 mL Intravenous PRN Sherren Mocha, MD      . acetaminophen (TYLENOL) tablet 650 mg  650 mg Oral Q4H PRN Sherren Mocha, MD      . aspirin EC tablet 81 mg  81 mg Oral Daily Sherren Mocha, MD   81 mg at 03/20/16 1009  . atorvastatin (LIPITOR) tablet 80 mg  80 mg Oral q1800 Sherren Mocha, MD   80 mg at 03/20/16 1711  . carvedilol (COREG) tablet 6.25 mg  6.25 mg Oral BID WC Sherren Mocha, MD   6.25 mg at 03/21/16 0750  . diazepam (VALIUM) tablet 5 mg  5 mg Oral Q12H PRN Burnell Blanks, MD      . enoxaparin (LOVENOX) injection 40 mg  40 mg Subcutaneous Q24H Sherren Mocha, MD   40 mg at 03/20/16 1009  . furosemide (LASIX) injection 40 mg  40 mg Intravenous BID Larey Dresser, MD   40 mg at 03/21/16 0750  . insulin aspart (novoLOG) injection 0-15 Units   0-15 Units Subcutaneous TID WC Sherren Mocha, MD   2 Units at 03/19/16 1717  . isosorbide mononitrate (IMDUR) 24 hr tablet 30 mg  30 mg Oral Daily Burnell Blanks, MD   30 mg at 03/20/16 1141  . labetalol (NORMODYNE,TRANDATE) injection 20 mg  20 mg Intravenous Q4H PRN Sherren Mocha, MD   20 mg at 03/18/16 0506  . lisinopril (PRINIVIL,ZESTRIL) tablet 20 mg  20 mg Oral Daily Larey Dresser, MD   20 mg at 03/20/16 1008  . metFORMIN (GLUCOPHAGE) tablet 500 mg  500 mg Oral BID WC Burnell Blanks, MD   500 mg at 03/21/16 0750  . morphine 2 MG/ML injection 2 mg  2 mg Intravenous Q1H PRN Sherren Mocha, MD      . nitroGLYCERIN (NITROSTAT) SL tablet 0.4 mg  0.4 mg Sublingual Q5 Min x 3 PRN Sherren Mocha, MD   0.4 mg at 03/19/16 1442  . ondansetron (ZOFRAN) injection 4 mg  4 mg Intravenous Q6H PRN Sherren Mocha, MD      . oxyCODONE-acetaminophen (PERCOCET/ROXICET) 5-325 MG per tablet 1-2 tablet  1-2 tablet Oral Q4H PRN Sherren Mocha, MD   2 tablet at 03/20/16 1141  . sodium chloride flush (NS) 0.9 % injection 3 mL  3  mL Intravenous Q12H Sherren Mocha, MD   3 mL at 03/20/16 2200  . sodium chloride flush (NS) 0.9 % injection 3 mL  3 mL Intravenous PRN Sherren Mocha, MD      . sodium chloride flush (NS) 0.9 % injection 3 mL  3 mL Intravenous Q12H Sherren Mocha, MD   3 mL at 03/19/16 1000  . sodium chloride flush (NS) 0.9 % injection 3 mL  3 mL Intravenous PRN Sherren Mocha, MD      . ticagrelor [facility]) tablet 90 mg  90 mg Oral BID Sherren Mocha, MD   90 mg at 03/20/16 2217  . venlafaxine XR (EFFEXOR-XR) 24 hr capsule 150 mg  150 mg Oral Q breakfast Burnell Blanks, MD   150 mg at 03/21/16 0750    PE: General appearance: alert, cooperative, no distress and moderately obese Neck: no carotid bruit and no JVD Lungs: clear to auscultation bilaterally Heart: regular rate and rhythm, S1, S2 normal, no murmur, click, rub or gallop Extremities: no LEE Pulses: 2+ and  symmetric Skin: warm and dry Neurologic: Grossly normal  Lab Results:   Recent Labs  03/18/16 1035 03/19/16 0157 03/20/16 0441  WBC 12.4* 8.7 8.1  HGB 10.6* 10.1* 11.1*  HCT 33.6* 31.1* 35.1*  PLT 311 305 262   BMET  Recent Labs  03/19/16 0157 03/20/16 0441 03/21/16 0329  NA 137 140 140  K 3.9 3.9 3.9  CL 104 105 105  CO2 26 22 25   GLUCOSE 142* 117* 121*  BUN 16 20 22*  CREATININE 0.87 0.77 0.89  CALCIUM 9.2 9.6 9.3   PT/INR No results for input(s): LABPROT, INR in the last 72 hours. Cholesterol  Recent Labs  03/18/16 1035  CHOL 227*   Cardiac Panel (last 3 results)  Recent Labs  03/18/16 1035  TROPONINI 2.30*    Studies/Results: 2D Echo 03/18/16 Study Conclusions  - Left ventricle: The cavity size was normal. Wall thickness was  normal. Systolic function was moderately to severely reduced. The  estimated ejection fraction was in the range of 30% to 35%.  Akinesis of the entireanteroseptal myocardium; consistent with  infarction. Doppler parameters are consistent with abnormal left  ventricular relaxation (grade 1 diastolic dysfunction). - Mitral valve: Mildly calcified annulus. There was mild  regurgitation. - Left atrium: The atrium was severely dilated.  Assessment/Plan  Active Problems:   Essential hypertension   HLD (hyperlipidemia)   DM type 2 (diabetes mellitus, type 2) (HCC)   ST elevation myocardial infarction (STEMI) of anterior wall (HCC)   Acute ST elevation myocardial infarction (STEMI) involving left anterior descending coronary artery (HCC)   Systolic HF (heart failure) (HCC)  1. CAD/STEMI: Anterolateral STEMI with serial 80% stenoses in the LAD. This was treated with DES x 2. No other obstructive disease.  IV nitro discontinued and Imdur 30 mg added yesterday. Now CP free. Continue ASA, Brilinta, statin, beta blocker, Ace-I.  2. Diabetes mellitus: Metformin was restarted 48 hrs post cath. Further management per PCP. She  is on an ACE-I and statin.   3. Ischemic cardiomyopathy/Acute systolic CHF: LVEF [id number] on echo 03/18/16. Volume appears stable today. Consider transition from IV lasix to PO. Continue Coreg and Lisinopril. Patient was fitted with LifeVest. Plan is to recheck LVF in 6 weeks. If EF remains <35%, she will need referral to EP clinic for ICD consideration.   4. HTN: mildly elevated. Goal given DM is <130/80. SBP in the upper 140s. Further titrate BB and ACE-I for IV  dysfunction.   5. HLD: LDL poorly controlled at 171 mg/dL. Continue high dose Lipitor, 80 mg nightly. Recheck FLP and HFTs in 6 weeks.   Dispo: possible d/c home later today or tomorrow.     LOS: 3 days    Brittainy M. Ladoris Gene 03/21/2016 9:13 AM   I have personally seen and examined this patient with Lyda Jester, PA-C. I agree with the assessment and plan as outlined above. She is stable post PCI/STEMI. She is very anxious. Effexor restarted yesterday. Labs ok. Exam with RRR, clear lungs, no LE edema. She will be discharged with a Lifevest given severe LV systolic dysfunction. Will need echo in 6 weeks and f/u with Dr. Burt Knack to discuss ICD if no improvement in LVEF. Will stop supplemental O2 now, ambulate. If stable, will d/c home later today  Lauree Chandler 03/21/2016 9:47 AM

## 2016-03-21 NOTE — Progress Notes (Signed)
CARDIAC REHAB PHASE I   Pt up ad lib in room, still on oxygen, pt declines ambulation at this time. Pt states she must wait for her husband to arrive this afternoon to "wash up" before she will walk in the hallway. Encouraged pt to ambulate with staff/husband this afternoon. Completed MI/PCI/CHF education. Reviewed previous education, exercise guidelines, phase 2 cardiac rehab, sodium restrictions, heart healthy diet and carb counting/portion control. Pt verbalized understanding. Phase 2 referral sent to [facility] per pt request. Pt in bed following education, call bell within reach. Will follow.   [id number] Lenna Sciara, RN, BSN 03/21/2016 8:37 AM

## 2016-03-21 NOTE — Progress Notes (Signed)
  Revisited patient this afternoon to see if stable for discharge. She is now off supplemental O2. O2 sats at 98% on RA with ambulation, however patient notes feeling unsteady and a bit dizzy while walking. She had another bout of dyspnea (likely panic attack about 1 hr ago). She is currently asymptomatic but feels that she is not ready to go home this afternoon. She has concerns regarding going home today as her husband has mild dementia and she still does not feel 100%. She has requested to stay another night. Will d/c home in the am.  Lyda Jester 03/21/2016

## 2016-03-21 NOTE — Care Management Important Message (Signed)
Important Message  Patient Details  Name: Melanie Ray MRN: [id number] Date of Birth: Aug 03, 1951   Medicare Important Message Given:  Yes    Dawayne Patricia, RN 03/21/2016, 11:06 AM

## 2016-03-21 NOTE — Telephone Encounter (Signed)
7 day TOC fu appt-appt 03-28-16 at [facility] with Brittainy per [facility]

## 2016-03-21 NOTE — Discharge Summary (Signed)
Discharge Summary    Patient ID: Melanie Ray,  MRN: [id number], DOB/AGE: 1950-12-23 65 y.o.  Admit date: 03/17/2016 Discharge date: 03/22/2016  Primary Care Provider: Coral Spikes Primary Cardiologist: Dr. Burt Knack  Discharge Diagnoses    Active Problems:   Essential hypertension   HLD (hyperlipidemia)   DM type 2 (diabetes mellitus, type 2) ([place])   ST elevation myocardial infarction (STEMI) of anterior wall (HCC)   Acute ST elevation myocardial infarction (STEMI) involving left anterior descending coronary artery (HCC)   Systolic HF (heart failure) (HCC)   Allergies Allergies  Allergen Reactions  . Meperidine Nausea And Vomiting  . Tetracycline Swelling    Diagnostic Studies/Procedures    Emergent LHC 03/17/16 Procedures    Coronary Stent Intervention   Left Heart Cath and Coronary Angiography    Conclusion     LPDA lesion, 30% stenosed.  Ost 1st Diag to 1st Diag lesion, 60% stenosed.  Dist LAD lesion, 50% stenosed.  Prox LAD lesion, 80% stenosed. Post intervention, there is a 0% residual stenosis.  Mid LAD lesion, 80% stenosed. Post intervention, there is a 0% residual stenosis.  There is mild left ventricular systolic dysfunction.  1. Acute anterolateral ST segment elevation MI with successful stenting of the proximal and mid LAD  2. Widely patent, dominant left circumflex  3. Patent, nondominant RCA  4. Proximal LAD dissection treated successfully with primary stenting  5. Moderate segmental LV systolic dysfunction with LVEF estimated at 45%     2D Echo 03/18/16 Study Conclusions  - Left ventricle: The cavity size was normal. Wall thickness was  normal. Systolic function was moderately to severely reduced. The  estimated ejection fraction was in the range of 30% to 35%.  Akinesis of the entireanteroseptal myocardium; consistent with  infarction. Doppler parameters are consistent with abnormal left  ventricular relaxation (grade  1 diastolic dysfunction). - Mitral valve: Mildly calcified annulus. There was mild  regurgitation. - Left atrium: The atrium was severely dilated.   History of Present Illness     65 y.o. female w/ PMHx significant for diabetes, HTN, hyperlipidemia who presented to [facility] on 03/18/2016 with complaints of chest pain. The patient is transferred directly to the cardiac catheterization lab from [facility] emergency department. She presented there with substernal chest pain described as a pressure-like sensation. Her pain started at approximately 5 PM tonight. There is associated shortness of breath, diaphoresis, nausea, and vomiting. She has not had pain similar to this in the past. On arrival to the Cath Lab, she is diaphoretic and vomiting. She continues to have chest discomfort.  The patient has no personal history of cardiac disease. She takes metformin for type 2 diabetes. She is a retired Radio producer. She is a remote smoker, quit in 1998. She has no family history of coronary artery disease. Her mother died of congestive heart failure.  Hospital Course   Patient was taken to [facility] cath lab for emergent LHC on arrival. Procedure was performed by Dr. Burt Knack. She was found to have a proximal LAD dissection treated successfully with primary stenting. The left circumflex and RCA were both widely patent. Moderate segmental LV systolic dysfunction was initially noted at time of cath. Initial estimate was 45%. She tolerated the procedure well and left the cath lab in stable condition. Her CP resolved. She was placed on DAPT with ASA + Brilinta. Also placed on a BB, high intensity statin (LDL elevated at 171 mg/dL) and an ACE-I. Post MI 2D echo was  obtained which demonstrated reduction in EF down to 30-35%.  There was akinesis of the entire anteroseptal myocardium, consistent with infarction. Doppler parameters were w/c G1DDD. No worrisome ventricular arrhyhtmia were note on telemetry. However, given  anterior infarct with low EF of <35%, she was prescribed a LifeVest for primary prevention. Plan is to treat with guidelines directed medical therapy for HF and recheck a f/u echo in 6 weeks. (BB, ACE-I and nitrate. BP too soft to initiate Aldactone and hydralazine at this time). If EF remains <35%, she will need EP referral for an ICD.  Patient was monitored closely post MI. She did have some mild recurrent CP and dyspnea, but also in the setting of extreme anxiety. She has a PMH of anxiety/depression and had missed several doses of Effexor. This was restarted and symptoms improved. LA nitrate was also added, Imdur 30 mg, which helped her chest pain. Her dyspnea also improved with lasix. She was eventually able to wean off of supplemental O2. She ambulated with cardiac rehab w/o difficulty. She was last seen and examined by Dr. Angelena Form who determined she was stable for discharge home. She was fitted with a LifeVest prior to discharge. TOC post hospital f/u has been arranged with Lyda Jester, PA-C, in 1 week on 03/28/16. She is scheduled to see Dr. Burt Knack in 6 weeks on 05/10/16.    Consultants: none    Discharge Vitals Blood pressure 130/55, pulse 84, temperature 98.1 F (36.7 C), temperature source Oral, resp. rate 18, height 5\' 3"  (1.6 m), weight 178 lb 3.2 oz (80.831 kg), SpO2 98 %.  Filed Weights   03/20/16 0451 03/21/16 0519 03/22/16 0403  Weight: 175 lb 3.2 oz (79.47 kg) 174 lb 9.6 oz (79.198 kg) 178 lb 3.2 oz (80.831 kg)    Labs & Radiologic Studies    CBC  Recent Labs  03/20/16 0441  WBC 8.1  HGB 11.1*  HCT 35.1*  MCV 82.6  PLT 99991111   Basic Metabolic Panel  Recent Labs  03/20/16 0441 03/21/16 0329  NA 140 140  K 3.9 3.9  CL 105 105  CO2 22 25  GLUCOSE 117* 121*  BUN 20 22*  CREATININE 0.77 0.89  CALCIUM 9.6 9.3   Liver Function Tests No results for input(s): AST, ALT, ALKPHOS, BILITOT, PROT, ALBUMIN in the last 72 hours. No results for input(s): LIPASE,  AMYLASE in the last 72 hours. Cardiac Enzymes No results for input(s): CKTOTAL, CKMB, CKMBINDEX, TROPONINI in the last 72 hours. BNP Invalid input(s): POCBNP D-Dimer No results for input(s): DDIMER in the last 72 hours. Hemoglobin A1C No results for input(s): HGBA1C in the last 72 hours. Fasting Lipid Panel No results for input(s): CHOL, HDL, LDLCALC, TRIG, CHOLHDL, LDLDIRECT in the last 72 hours. Thyroid Function Tests No results for input(s): TSH, T4TOTAL, T3FREE, THYROIDAB in the last 72 hours.  Invalid input(s): FREET3 _____________  Dg Chest 2 View  03/20/2016  CLINICAL DATA:  Hervey Ard right-sided chest pain and shortness of breath today. EXAM: CHEST  2 VIEW COMPARISON:  03/17/2016 FINDINGS: Cardiac silhouette is mildly enlarged. There are coronary artery calcifications and a stent. No mediastinal or hilar masses or evidence of adenopathy. Lungs are clear.  No pleural effusion or pneumothorax. Bony thorax is demineralized but intact. IMPRESSION: No acute cardiopulmonary disease. Electronically Signed   By: Lajean Manes M.D.   On: 03/20/2016 16:00   Dg Chest 2 View  03/17/2016  CLINICAL DATA:  Chest pain radiating into the left arm, shortness of Breath  EXAM: CHEST  2 VIEW COMPARISON:  None. FINDINGS: Cardiac shadow is within normal limits. Mild central vascular congestion is seen with mild interstitial edema. No focal infiltrate or sizable effusion is seen. No bony abnormality is noted. IMPRESSION: Mild CHF. Electronically Signed   By: Inez Catalina M.D.   On: 03/17/2016 20:48   Disposition   Pt is being discharged home today in good condition.  Follow-up Plans & Appointments    Follow-up Information    Follow up with Lyda Jester, PA-C On 03/28/2016.   Specialties:  Cardiology, Radiology   Why:  9:00 AM (Dr. Antionette Char PA)   Contact information:   [address] [place] [address] [phone]       Follow up with Sherren Mocha, MD On 05/10/2016.   Specialty:   Cardiology   Why:  9:00 AM    Contact information:   [id number] [place] [address] [phone]      Discharge Instructions    Amb Referral to Cardiac Rehabilitation    Complete by:  As directed   Diagnosis:   STEMI Coronary Stents       Diet - low sodium heart healthy    Complete by:  As directed      Increase activity slowly    Complete by:  As directed            Discharge Medications   Current Discharge Medication List    START taking these medications   Details  aspirin EC 81 MG EC tablet Take 1 tablet (81 mg total) by mouth daily.    atorvastatin (LIPITOR) 80 MG tablet Take 1 tablet (80 mg total) by mouth daily at 6 PM. Qty: 30 tablet, Refills: 5    carvedilol (COREG) 6.25 MG tablet Take 1 tablet (6.25 mg total) by mouth 2 (two) times daily with a meal. Qty: 60 tablet, Refills: 5    furosemide (LASIX) 40 MG tablet Take 1 tablet (40 mg total) by mouth daily. Qty: 30 tablet, Refills: 5    isosorbide mononitrate (IMDUR) 30 MG 24 hr tablet Take 1 tablet (30 mg total) by mouth daily. Qty: 30 tablet, Refills: 5    nitroGLYCERIN (NITROSTAT) 0.4 MG SL tablet Place 1 tablet (0.4 mg total) under the tongue every 5 (five) minutes x 3 doses as needed for chest pain. Qty: 25 tablet, Refills: 2    !! ticagrelor (BRILINTA) 90 MG TABS tablet Take 1 tablet (90 mg total) by mouth 2 (two) times daily. Qty: 60 tablet, Refills: 10    !! ticagrelor (BRILINTA) 90 MG TABS tablet Take 1 tablet (90 mg total) by mouth 2 (two) times daily. Qty: 60 tablet, Refills: 0     !! - Potential duplicate medications found. Please discuss with provider.    CONTINUE these medications which have NOT CHANGED   Details  dorzolamide-timolol (COSOPT) 22.3-6.8 MG/ML ophthalmic solution Place 1 drop into both eyes 2 (two) times daily.     lisinopril (PRINIVIL,ZESTRIL) 20 MG tablet Take 1 tablet (20 mg total) by mouth daily. Qty: 90 tablet, Refills: 3    metFORMIN  (GLUCOPHAGE) 500 MG tablet Take 1 tablet (500 mg total) by mouth 2 (two) times daily with a meal. Qty: 180 tablet, Refills: 3    MULTIPLE VITAMIN PO Take 1 tablet by mouth daily.     Omega-3 Fatty Acids (OMEGA 3 PO) Take 1 tablet by mouth daily.    venlafaxine XR (EFFEXOR-XR) 150 MG 24 hr capsule  Take 1 capsule (150 mg total) by mouth daily. Qty: 30 capsule, Refills: 4      STOP taking these medications     traZODone (DESYREL) 100 MG tablet          Aspirin prescribed at discharge?  Yes High Intensity Statin Prescribed? (Lipitor 40-80mg  or Crestor 20-40mg ): Yes Beta Blocker Prescribed? Yes For EF <40%, was ACEI/ARB Prescribed? Yes ADP Receptor Inhibitor Prescribed? (i.e. Plavix etc.-Includes Medically Managed Patients): Yes For EF <40%, Aldosterone Inhibitor Prescribed? No: BP too soft Was EF assessed during THIS hospitalization? Yes Was Cardiac Rehab II ordered? (Included Medically managed Patients): No:    Outstanding Labs/Studies   F/u 2D Echo in 6 weeks  Duration of Discharge Encounter   Greater than 30 minutes including physician time.  Signed, Lyda Jester PA-C 03/22/2016, 9:47 AM

## 2016-03-22 DIAGNOSIS — E785 Hyperlipidemia, unspecified: Secondary | ICD-10-CM

## 2016-03-22 DIAGNOSIS — I10 Essential (primary) hypertension: Secondary | ICD-10-CM

## 2016-03-22 LAB — GLUCOSE, CAPILLARY: GLUCOSE-CAPILLARY: 124 mg/dL — AB (ref 65–99)

## 2016-03-22 NOTE — Telephone Encounter (Signed)
Pt still currently admitted.  Will call for TCM on 5/19 if d/c'ed.

## 2016-03-22 NOTE — Progress Notes (Signed)
Patient Profile: 65 yo female with history of HTN and DM presented with anterolateral STEMI (LBBB with concordant STE in V4). She is now s/p DES x 2 to LAD.   Subjective: Feels much better today. No CP or dyspnea. No anxiety today. Feels comfortable going home.   Objective: Vital signs in last 24 hours: Temp:  [97.5 F (36.4 C)-98.7 F (37.1 C)] 98.1 F (36.7 C) (05/18 0403) Pulse Rate:  [74-91] 84 (05/18 0403) Resp:  [18-21] 18 (05/18 0403) BP: (109-132)/(53-65) 130/55 mmHg (05/18 0403) SpO2:  [98 %-100 %] 98 % (05/18 0403) Weight:  [178 lb 3.2 oz (80.831 kg)] 178 lb 3.2 oz (80.831 kg) (05/18 0403) Last BM Date: 03/18/16  Intake/Output from previous day: 05/17 0701 - 05/18 0700 In: 723 [P.O.:720; I.V.:3] Out: 350 [Urine:350] Intake/Output this shift:    Medications Current Facility-Administered Medications  Medication Dose Route Frequency Provider Last Rate Last Dose  . 0.9 %  sodium chloride infusion  250 mL Intravenous PRN Sherren Mocha, MD 10 mL/hr at 03/18/16 0500 250 mL at 03/18/16 0500  . 0.9 %  sodium chloride infusion  250 mL Intravenous PRN Sherren Mocha, MD      . acetaminophen (TYLENOL) tablet 650 mg  650 mg Oral Q4H PRN Sherren Mocha, MD      . aspirin EC tablet 81 mg  81 mg Oral Daily Sherren Mocha, MD   81 mg at 03/21/16 1002  . atorvastatin (LIPITOR) tablet 80 mg  80 mg Oral q1800 Sherren Mocha, MD   80 mg at 03/21/16 1719  . carvedilol (COREG) tablet 6.25 mg  6.25 mg Oral BID WC Sherren Mocha, MD   6.25 mg at 03/22/16 0813  . diazepam (VALIUM) tablet 5 mg  5 mg Oral Q12H PRN Burnell Blanks, MD   5 mg at 03/21/16 1539  . enoxaparin (LOVENOX) injection 40 mg  40 mg Subcutaneous Q24H Sherren Mocha, MD   40 mg at 03/21/16 1003  . furosemide (LASIX) tablet 40 mg  40 mg Oral Daily Burnell Blanks, MD   40 mg at 03/21/16 1013  . insulin aspart (novoLOG) injection 0-15 Units  0-15 Units Subcutaneous TID WC Sherren Mocha, MD   2 Units at  03/22/16 [phone]  . isosorbide mononitrate (IMDUR) 24 hr tablet 30 mg  30 mg Oral Daily Burnell Blanks, MD   30 mg at 03/21/16 1003  . labetalol (NORMODYNE,TRANDATE) injection 20 mg  20 mg Intravenous Q4H PRN Sherren Mocha, MD   20 mg at 03/18/16 0506  . lisinopril (PRINIVIL,ZESTRIL) tablet 20 mg  20 mg Oral Daily Larey Dresser, MD   20 mg at 03/21/16 1002  . metFORMIN (GLUCOPHAGE) tablet 500 mg  500 mg Oral BID WC Burnell Blanks, MD   500 mg at 03/22/16 0608  . morphine 2 MG/ML injection 2 mg  2 mg Intravenous Q1H PRN Sherren Mocha, MD      . nitroGLYCERIN (NITROSTAT) SL tablet 0.4 mg  0.4 mg Sublingual Q5 Min x 3 PRN Sherren Mocha, MD   0.4 mg at 03/19/16 1442  . ondansetron (ZOFRAN) injection 4 mg  4 mg Intravenous Q6H PRN Sherren Mocha, MD      . oxyCODONE-acetaminophen (PERCOCET/ROXICET) 5-325 MG per tablet 1-2 tablet  1-2 tablet Oral Q4H PRN Sherren Mocha, MD   2 tablet at 03/20/16 1141  . sodium chloride flush (NS) 0.9 % injection 3 mL  3 mL Intravenous Q12H Sherren Mocha, MD   3 mL at  03/21/16 2200  . sodium chloride flush (NS) 0.9 % injection 3 mL  3 mL Intravenous PRN Sherren Mocha, MD   3 mL at 03/21/16 1535  . sodium chloride flush (NS) 0.9 % injection 3 mL  3 mL Intravenous Q12H Sherren Mocha, MD   3 mL at 03/21/16 2200  . sodium chloride flush (NS) 0.9 % injection 3 mL  3 mL Intravenous PRN Sherren Mocha, MD      . ticagrelor [facility]) tablet 90 mg  90 mg Oral BID Sherren Mocha, MD   90 mg at 03/21/16 2336  . venlafaxine XR (EFFEXOR-XR) 24 hr capsule 150 mg  150 mg Oral Q breakfast Burnell Blanks, MD   150 mg at 03/22/16 0609    PE: General appearance: alert, cooperative, no distress and moderately obese Neck: no carotid bruit and no JVD Lungs: clear to auscultation bilaterally Heart: regular rate and rhythm, S1, S2 normal, no murmur, click, rub or gallop Extremities: no LEE Pulses: 2+ and symmetric Skin: warm and dry Neurologic: Grossly  normal  Lab Results:   Recent Labs  03/20/16 0441  WBC 8.1  HGB 11.1*  HCT 35.1*  PLT 262   BMET  Recent Labs  03/20/16 0441 03/21/16 0329  NA 140 140  K 3.9 3.9  CL 105 105  CO2 22 25  GLUCOSE 117* 121*  BUN 20 22*  CREATININE 0.77 0.89  CALCIUM 9.6 9.3   PT/INR No results for input(s): LABPROT, INR in the last 72 hours. Cholesterol No results for input(s): CHOL in the last 72 hours. Cardiac Panel (last 3 results) No results for input(s): CKTOTAL, CKMB, TROPONINI, RELINDX in the last 72 hours.  Studies/Results: 2D Echo 03/18/16 Study Conclusions  - Left ventricle: The cavity size was normal. Wall thickness was  normal. Systolic function was moderately to severely reduced. The  estimated ejection fraction was in the range of 30% to 35%.  Akinesis of the entireanteroseptal myocardium; consistent with  infarction. Doppler parameters are consistent with abnormal left  ventricular relaxation (grade 1 diastolic dysfunction). - Mitral valve: Mildly calcified annulus. There was mild  regurgitation. - Left atrium: The atrium was severely dilated.  Assessment/Plan  Active Problems:   Essential hypertension   HLD (hyperlipidemia)   DM type 2 (diabetes mellitus, type 2) (HCC)   ST elevation myocardial infarction (STEMI) of anterior wall (HCC)   Acute ST elevation myocardial infarction (STEMI) involving left anterior descending coronary artery (HCC)   Systolic HF (heart failure) (HCC)   1. CAD/STEMI: Anterolateral STEMI with serial 80% stenoses in the LAD. This was treated with DES x 2. No other obstructive disease. Now CP free. Continue ASA, Brilinta, statin, beta blocker, Ace-I and LA nitrate.  2. Diabetes mellitus: Metformin was restarted 48 hrs post cath. Further management per PCP. She is on an ACE-I and statin.   3. Ischemic cardiomyopathy/Acute systolic CHF: LVEF 99991111 on echo 03/18/16. Volume appears stable today. Continue PO Lasix. Continue Coreg and  Lisinopril. Patient was fitted with LifeVest. Plan is to recheck LVF in 6 weeks. If EF remains <35%, she will need referral to EP clinic for ICD consideration.   4. HTN: Well controlled this morning. Goal given DM is <130/80.  Further titrate BB and ACE-I in clinic for IV dysfunction.   5. HLD: LDL poorly controlled at 171 mg/dL. Continue high dose Lipitor, 80 mg nightly. Recheck FLP and HFTs in 6 weeks.   Dispo: d/c home today.    LOS: 4 days  Brittainy M. Ladoris Gene 03/22/2016 9:10 AM     I have personally seen and examined this patient with Lyda Jester, PA-C. I agree with the assessment and plan as outlined above. She is stable post STEMI/DES x 2 in the LAD. She is on good medical therapy. BP is stable. She has been fitted for her Lifevest and has no questions. Discharge home today and plan f/u in the office in 1-2 weeks with Burt Knack or office APP. Repeat echo in 6 weeks to assess LVEF.   Lauree Chandler 03/22/2016 9:29 AM

## 2016-03-22 NOTE — Progress Notes (Signed)
CARDIAC REHAB PHASE I   PRE:  Rate/Rhythm: 87 SR  BP:  Sitting: 138/69        SaO2: 100 RA  MODE:  Ambulation: 250 ft   POST:  Rate/Rhythm: 83 SR  BP:  Sitting: 139/59         SaO2: 100 RA  Pt up ad lib in room, states she feels "much better" this morning. Pt ambulated 450 ft on RA, hand held assist, steady gait, tolerated well with no complaints. VSS. Pt states she has no questions regarding education at this time. Pt to edge of bed per pt request after walk, call bell within reach, husband at bedside, awaiting discharge.   [place], RN, BSN 03/22/2016 9:45 AM

## 2016-03-22 NOTE — Care Management Note (Signed)
Case Management Note Previous CM note initiated by Donne Anon RN, CM  Patient Details  Name: Melanie Ray MRN: [id number] Date of Birth: 11/17/1950  Subjective/Objective:    Adm w mi                Action/Plan: lives w husband, pcp dr Lacinda Axon   Expected Discharge Date:  03/22/16                Expected Discharge Plan:  Home/Self Care  In-House Referral:     Discharge planning Services  CM Consult, Medication Assistance  Post Acute Care Choice:    Choice offered to:     DME Arranged:    DME Agency:     HH Arranged:    HH Agency:     Status of Service:  Completed, signed off  Medicare Important Message Given:  Yes Date Medicare IM Given:    Medicare IM give by:    Date Additional Medicare IM Given:    Additional Medicare Important Message give by:     If discussed at [place] of Stay Meetings, dates discussed:    Additional Comments: gave pt 30day free brilinta card. States has [employer] plan for meds  03/21/16- 1200- [name] RN, BSN- spoke with pt at bedside- confirmed that pt had 30 day free Brilinta card- insurance check has been completed- [facility] @ [place] # [phone]  BRILINTA  90 MG BID ( 30 )   COVER- YES  CO-PAY- $332.00 DEDUCTIBLE OF $400.00 ONLY MET $ 50.00  WHEN MET WILL PAY PATIENT WILL PAY 20 % = $66.00  TIER- 3 DRUG  PRIOR APPROVAL -NO  PHARMACY : [facility]   discussed copay coverage with pt including her deductible copay differences once deductible met. Pt has the brilinta resource # if needed. And understands that cardiology office can sometimes assist with samples if needed to help bridge pts to be sure that she has the medication. - No further CM needs noted at this time.   Dawayne Patricia, RN 03/22/2016, 10:41 AM

## 2016-03-22 NOTE — Progress Notes (Signed)
Pt/family given discharge instructions, medication lists, follow up appointments, and when to call the doctor.  Pt/family verbalizes understanding. Pt given signs and symptoms of infection. Camiyah Friberg McClintock, RN    

## 2016-03-23 ENCOUNTER — Telehealth: Payer: Self-pay

## 2016-03-23 NOTE — Telephone Encounter (Signed)
Transition Care Management Follow-up Telephone Call   Date discharged? 03/22/16   How have you been since you were released from the hospital? I feel weak and a little dizzy at times, but doing as well as to be expected.  Tired.  Eating/drinking well.  Resting well. Voiding/BM without issues.   Do you understand why you were in the hospital? YES, STEMI   Do you understand the discharge instructions? YES, increase activity slowly, use R arm as little as possible right now, eat a diet lower in sodium/sugar and join the cardio program for additional teaching and exercises at [facility].   Where were you discharged to? Home.   Items Reviewed:  Medications reviewed: YES, STARTED taking aspirin 81mg , LIPITOR 80mg , COREG 6.25mg , LASIX 40mg , IMDUR 30mg , NITROSTAT 0.4mg , BRILINTA 90mg  without issues.  CONTINUING all other scheduled medications which have not changed, as previously directed.  STOPPED taking TRAZODONE 100mg .  Allergies reviewed: YES, Meperidine, Tetracycline.  Dietary changes reviewed: YES, low sodium/heart healthy.  Referrals reviewed: YES, appointments scheduled with Cardiology, Cardiac Rehabilitation and PCP.    Functional Questionnaire:   Activities of Daily Living (ADLs):   She states they are independent in the following: Toileting, grooming, self feeding. States they require assistance with the following: Ambulating, bathing, dressing, meal prep.  Husband assists.   Any transportation issues/concerns?: NO, husband will drive her to the appointment.   Any patient concerns? YES, she is worried about prolonged use of medications and the potential to cause kidney damage in the future.  Also, the need for a sleep study for Hx of sleep apnea. Deferred to follow up at upcoming appointment.   Confirmed importance and date/time of follow-up visits scheduled YES, appointment scheduled 03/26/16 at 3:30.  Provider Appointment booked with Dr. Lacinda Axon  (PCP).  Confirmed with patient if condition begins to worsen call PCP or go to the ER.  Patient was given the office number and encouraged to call back with question or concerns.  : YES, patient verbalized understanding.

## 2016-03-23 NOTE — Telephone Encounter (Signed)
Patient contacted regarding discharge from [facility] on May 18,2017.  Patient understands to follow up with provider yes--Brittany Rosita Fire, [place] on May 24,2017 at 9:00 Patient understands discharge instructions?  yes Patient understands medications and regiment? yes Patient understands to bring all medications to this visit? yes

## 2016-03-25 DIAGNOSIS — Z7982 Long term (current) use of aspirin: Secondary | ICD-10-CM | POA: Diagnosis not present

## 2016-03-25 DIAGNOSIS — I110 Hypertensive heart disease with heart failure: Secondary | ICD-10-CM | POA: Diagnosis not present

## 2016-03-25 DIAGNOSIS — Z7984 Long term (current) use of oral hypoglycemic drugs: Secondary | ICD-10-CM | POA: Insufficient documentation

## 2016-03-25 DIAGNOSIS — Z87891 Personal history of nicotine dependence: Secondary | ICD-10-CM | POA: Insufficient documentation

## 2016-03-25 DIAGNOSIS — S5011XA Contusion of right forearm, initial encounter: Secondary | ICD-10-CM | POA: Insufficient documentation

## 2016-03-25 DIAGNOSIS — I5020 Unspecified systolic (congestive) heart failure: Secondary | ICD-10-CM | POA: Diagnosis not present

## 2016-03-25 DIAGNOSIS — Y939 Activity, unspecified: Secondary | ICD-10-CM | POA: Diagnosis not present

## 2016-03-25 DIAGNOSIS — E785 Hyperlipidemia, unspecified: Secondary | ICD-10-CM | POA: Diagnosis not present

## 2016-03-25 DIAGNOSIS — E119 Type 2 diabetes mellitus without complications: Secondary | ICD-10-CM | POA: Insufficient documentation

## 2016-03-25 DIAGNOSIS — S60811A Abrasion of right wrist, initial encounter: Secondary | ICD-10-CM | POA: Diagnosis not present

## 2016-03-25 DIAGNOSIS — F329 Major depressive disorder, single episode, unspecified: Secondary | ICD-10-CM | POA: Insufficient documentation

## 2016-03-25 DIAGNOSIS — Y999 Unspecified external cause status: Secondary | ICD-10-CM | POA: Insufficient documentation

## 2016-03-25 DIAGNOSIS — Z79899 Other long term (current) drug therapy: Secondary | ICD-10-CM | POA: Diagnosis not present

## 2016-03-25 DIAGNOSIS — I252 Old myocardial infarction: Secondary | ICD-10-CM | POA: Diagnosis not present

## 2016-03-25 DIAGNOSIS — X58XXXA Exposure to other specified factors, initial encounter: Secondary | ICD-10-CM | POA: Diagnosis not present

## 2016-03-25 DIAGNOSIS — Y929 Unspecified place or not applicable: Secondary | ICD-10-CM | POA: Insufficient documentation

## 2016-03-25 DIAGNOSIS — S50811A Abrasion of right forearm, initial encounter: Secondary | ICD-10-CM | POA: Diagnosis not present

## 2016-03-25 NOTE — ED Notes (Signed)
Patient reports approximately 1 week ago had a heart attack and was transferred from here to [facility].  Patient concerned tonight because of bruising to area at right wrist where cardiac cath site that appeared tonight.  Obvious bruising noted at right wrist and up into right forearm, no swelling noted.

## 2016-03-26 ENCOUNTER — Ambulatory Visit (INDEPENDENT_AMBULATORY_CARE_PROVIDER_SITE_OTHER): Payer: Medicare Other | Admitting: Family Medicine

## 2016-03-26 ENCOUNTER — Encounter: Payer: Self-pay | Admitting: Family Medicine

## 2016-03-26 ENCOUNTER — Emergency Department
Admission: EM | Admit: 2016-03-26 | Discharge: 2016-03-26 | Disposition: A | Discharge status: Home / Self Care | Payer: Medicare Other | Attending: Emergency Medicine | Admitting: Emergency Medicine

## 2016-03-26 VITALS — BP 110/70 | HR 90 | Temp 97.6°F | Ht 63.0 in | Wt 173.6 lb

## 2016-03-26 DIAGNOSIS — I2102 ST elevation (STEMI) myocardial infarction involving left anterior descending coronary artery: Secondary | ICD-10-CM | POA: Diagnosis not present

## 2016-03-26 DIAGNOSIS — I5020 Unspecified systolic (congestive) heart failure: Secondary | ICD-10-CM | POA: Diagnosis not present

## 2016-03-26 DIAGNOSIS — T148XXA Other injury of unspecified body region, initial encounter: Secondary | ICD-10-CM

## 2016-03-26 MED ORDER — ROSUVASTATIN CALCIUM 40 MG PO TABS: 40.0000 mg | ORAL_TABLET | Freq: Every day | ORAL | Status: DC

## 2016-03-26 MED ORDER — ACETAMINOPHEN 500 MG PO TABS
1000.0000 mg | ORAL_TABLET | Freq: Once | ORAL | Status: AC
  Administered 2016-03-26: 1000 mg via ORAL
  Filled 2016-03-26: qty 2

## 2016-03-26 NOTE — Patient Instructions (Addendum)
Stop the lipitor. Start crestor (as you are not tolerating lipitor well).  Stop the lisinopril. We will potentially add another drug in the near future.  Continue the remainder of your medications.  Wear the life vest.  Follow up in 2 weeks.  Take care  Dr. Lacinda Axon

## 2016-03-26 NOTE — ED Provider Notes (Signed)
[facility] Emergency Department Provider Note  ____________________________________________  Time seen: Approximately 3:27 AM  I have reviewed the triage vital signs and the nursing notes.   HISTORY  Chief Complaint Bruising     HPI Melanie Ray is a 65 y.o. female who was transferred from [facility] to [facility] about one week ago for an NSTEMI who went directly to the catheterization lab and had PCI via her right radial artery.  She was discharged about 5 days ago on Brilinta and aspirin.  She presents tonight to the emergency Department by private vehicle for evaluation of what she says is new bruising to her right wrist.  She states that she was not bruised prior to tonight.  She awoke from sleep to go to the bathroom and felt like her right wrist and lower forearm was aching.  She looked and was surprising concern to see a significant amount of bruising on her wrist and palmar side of her forearm.  Her forearm and hand are warm and she has normal range of motion with no numbness nor tingling.  The pain is mild and aching like a bruise or contusion.  She is very adamant that the bruising was not present before tonight.  She arrives the symptoms as moderate and acute in onset.  She has felt like she has less energy than usual and has some dyspnea with exertion that has been present since her NSTEMI/PCI, but she has no acute symptoms tonight.     Past Medical History  Diagnosis Date  . Anxiety   . Depression   . Hypertension   . Hyperlipemia   . Glaucoma   . ST elevation myocardial infarction (STEMI) of anterior wall ([place]) 03/17/2016    Patient Active Problem List   Diagnosis Date Noted  . Systolic HF (heart failure) ([place]) 03/21/2016  . Acute ST elevation myocardial infarction (STEMI) involving left anterior descending coronary artery ([place]) 03/18/2016  . ST elevation myocardial infarction (STEMI) of anterior wall ([place]) 03/17/2016  . DM type 2 (diabetes  mellitus, type 2) ([place]) 08/03/2015  . Essential hypertension 07/28/2015  . HLD (hyperlipidemia) 07/28/2015  . Glaucoma 07/28/2015  . Depression 07/28/2015  . Anxiety 07/28/2015  . Insomnia 07/28/2015  . Obesity (BMI 30.0-34.9) 07/28/2015  . Dizziness 07/28/2015    Past Surgical History  Procedure Laterality Date  . Small bowel repair    . Appendectomy    . Tonsillectomy    . Cardiac catheterization N/A 03/17/2016    Procedure: Left Heart Cath and Coronary Angiography;  Surgeon: Sherren Mocha, MD;  Location: [place] [facility];  Service: Cardiovascular;  Laterality: N/A;  . Cardiac catheterization N/A 03/17/2016    Procedure: Coronary Stent Intervention;  Surgeon: Sherren Mocha, MD;  Location: [place] [facility];  Service: Cardiovascular;  Laterality: N/A;    Current Outpatient Rx  Name  Route  Sig  Dispense  Refill  . aspirin EC 81 MG EC tablet   Oral   Take 1 tablet (81 mg total) by mouth daily.         [name] atorvastatin (LIPITOR) 80 MG tablet   Oral   Take 1 tablet (80 mg total) by mouth daily at 6 PM.   30 tablet   5   . carvedilol (COREG) 6.25 MG tablet   Oral   Take 1 tablet (6.25 mg total) by mouth 2 (two) times daily with a meal.   60 tablet   5   . dorzolamide-timolol (COSOPT) 22.3-6.8  MG/ML ophthalmic solution   Both Eyes   Place 1 drop into both eyes 2 (two) times daily.          . furosemide (LASIX) 40 MG tablet   Oral   Take 1 tablet (40 mg total) by mouth daily.   30 tablet   5   . isosorbide mononitrate (IMDUR) 30 MG 24 hr tablet   Oral   Take 1 tablet (30 mg total) by mouth daily.   30 tablet   5   . lisinopril (PRINIVIL,ZESTRIL) 20 MG tablet   Oral   Take 1 tablet (20 mg total) by mouth daily.   90 tablet   3   . metFORMIN (GLUCOPHAGE) 500 MG tablet   Oral   Take 1 tablet (500 mg total) by mouth 2 (two) times daily with a meal.   180 tablet   3   . MULTIPLE VITAMIN PO   Oral   Take 1 tablet by mouth daily.          .  nitroGLYCERIN (NITROSTAT) 0.4 MG SL tablet   Sublingual   Place 1 tablet (0.4 mg total) under the tongue every 5 (five) minutes x 3 doses as needed for chest pain.   25 tablet   2   . Omega-3 Fatty Acids (OMEGA 3 PO)   Oral   Take 1 tablet by mouth daily.         . ticagrelor (BRILINTA) 90 MG TABS tablet   Oral   Take 1 tablet (90 mg total) by mouth 2 (two) times daily.   60 tablet   0     For free 90 day supply   . venlafaxine XR (EFFEXOR-XR) 150 MG 24 hr capsule   Oral   Take 1 capsule (150 mg total) by mouth daily.   30 capsule   4     Allergies Meperidine and Tetracycline  Family History  Problem Relation Age of Onset  . Anxiety disorder Mother   . Paranoid behavior Mother   . Hypertension Mother   . Heart failure Mother   . Stroke Mother   . Dementia Mother   . Hypertension Father   . Anxiety disorder Maternal Aunt   . Drug abuse Cousin   . Tuberculosis Paternal Grandfather   . Mood Disorder Sister   . Stroke Sister     Social History Social History  Substance Use Topics  . Smoking status: Former Smoker    Types: Cigarettes    Quit date: 04/13/1997  . Smokeless tobacco: Never Used  . Alcohol Use: 0.6 oz/week    1 Glasses of wine, 0 Standard drinks or equivalent, 0 Cans of beer, 0 Shots of liquor per week    Review of Systems Constitutional: No fever/chills Eyes: No visual changes. ENT: No sore throat. Cardiovascular: Denies chest pain. Respiratory: shortness of breath With exertion, new since her ACS last week but not acute tonight Gastrointestinal: No abdominal pain.  No nausea, no vomiting.  No diarrhea.  No constipation. Genitourinary: Negative for dysuria. Musculoskeletal: Negative for back pain.  Bruising and some swelling in the palmar aspect of her distal right forearm Neurological: Negative for headaches, focal weakness or numbness.  10-point ROS otherwise negative.  ____________________________________________   PHYSICAL  EXAM:  VITAL SIGNS: ED Triage Vitals  Enc Vitals Group     BP 03/26/16 0000 139/73 mmHg     Pulse Rate 03/26/16 0000 100     Resp 03/26/16 0000 20  Temp 03/26/16 0000 98.6 F (37 C)     Temp Source 03/26/16 0000 Oral     SpO2 03/26/16 0000 98 %     Weight 03/26/16 0000 175 lb (79.379 kg)     Height 03/26/16 0000 5\' 3"  (1.6 m)     Head Cir --      Peak Flow --      Pain Score 03/26/16 0001 5     Pain Loc --      Pain Edu? --      Excl. in [place]? --     Constitutional: Alert and oriented. Well appearing and in no acute distress. Eyes: Conjunctivae are normal. PERRL. EOMI. Head: Atraumatic. Nose: No congestion/rhinnorhea. Mouth/Throat: Mucous membranes are moist.  Oropharynx non-erythematous. Neck: No stridor.  No meningeal signs.   Cardiovascular: Normal rate, regular rhythm. Good peripheral circulation. Grossly normal heart sounds.   Respiratory: Normal respiratory effort.  No retractions. Lungs CTAB. Gastrointestinal: Soft and nontender. No distention.  Musculoskeletal: Bruising that appears subacute on the palmar aspect of her distal right forearm and wrist.  She has a normal right radial pulse that is easily palpable and no neurological deficits of her arm or her hand.  The hand and arm are both warm to the touch.  The compartments are soft and the patient has only mild tenderness to palpation of the bruised area. Neurologic:  Normal speech and language. No gross focal neurologic deficits are appreciated.  Skin:  Skin is warm, dry and intact. No rash noted. Psychiatric: Mood and affect are normal. Speech and behavior are normal.  ____________________________________________   LABS (all labs ordered are listed, but only abnormal results are displayed)  Labs Reviewed - No data to display ____________________________________________  EKG  None ____________________________________________  RADIOLOGY   No results  found.  ____________________________________________   PROCEDURES  Procedure(s) performed: None  Critical Care performed: No ____________________________________________   INITIAL IMPRESSION / ASSESSMENT AND PLAN / ED COURSE  Pertinent labs & imaging results that were available during my care of the patient were reviewed by me and considered in my medical decision making (see chart for details).  The patient's compartments are soft and only mildly tender and the bruising is very much not acute.  I believe she just may have not noticed it until tonight, but there is no indication of an acute vascular issue.  I performed a bedside ultrasound of the soft tissue and was easily able to trace the radial artery from the palm of her hand all the way up the forearm.  I also evaluated the soft tissues all around the area of bruising and there is no fluid collection, cellulitis, abscess, or other indication of an acute issue.  I recommended the patient use Tylenol and ice packs on the affected region and call her doctor.  She actually has a follow-up visit with her primary care doctor today and her appointment with her cardiologist in 2 days, which I feel is appropriate.  I gave my usual and customary return precautions.   ____________________________________________  FINAL CLINICAL IMPRESSION(S) / ED DIAGNOSES  Final diagnoses:  Bruise     MEDICATIONS GIVEN DURING THIS VISIT:  Medications  acetaminophen (TYLENOL) tablet 1,000 mg (1,000 mg Oral Given 03/26/16 0446)     NEW OUTPATIENT MEDICATIONS STARTED DURING THIS VISIT:  New Prescriptions   No medications on file      Note:  This document was prepared using Dragon voice recognition software and may include unintentional dictation errors.  Hinda Kehr, MD 03/26/16 [phone]

## 2016-03-26 NOTE — Progress Notes (Signed)
Pre visit review using our clinic review tool, if applicable. No additional management support is needed unless otherwise documented below in the visit note. 

## 2016-03-26 NOTE — Discharge Instructions (Signed)
As we discussed, there is no evidence that you have an acute injury to your forearm, including no evidence of a bleed or hematoma from your radial artery or from the veins in your right forearm.  The bedside ultrasound that we performed was reassuring.  We recommend that you use cold packs on the affected area and take over-the-counter Tylenol as needed for pain control.  Follow up with your primary care doctor today and with your cardiologist in 2 days as scheduled.  Return to the emergency department with new or worsening symptoms that concern you.

## 2016-03-27 MED ORDER — ISOSORBIDE MONONITRATE ER 30 MG PO TB24: 30.0000 mg | ORAL_TABLET | Freq: Every day | ORAL | Status: DC

## 2016-03-27 NOTE — Assessment & Plan Note (Signed)
Patient with recent STEMI, now s/p PCI x 2. Currently on ASA, Brilinta, Coreg, Lisinopril, Lipitor, Imdur. BP 110/70 and has been experiencing dizziness. Also appears to have cough from ACE inhibitor. Stopping lisinopril today. Has follow-up with cardiology on 5/24. It BP allows would recommend addition of ARB. Patient feeling nauseated to Lipitor. Switching to Crestor today. Advised compliance with her medications and LifeVest.

## 2016-03-27 NOTE — Progress Notes (Signed)
Subjective:  Patient ID: Melanie Ray, female    DOB: 20-Jul-1951  Age: 65 y.o. MRN: [id number]  CC: Hospital follow up  HPI:  65 year old female with HTN, DM-2, HLD presents for hospital follow up.   Hospital course and discharge summary reviewed and summarized as below.  ACS/STEMI  Presented to [facility] ED with substernal chest pain.  Found to have new onset left bundle with ST elevation in V4 (STEMI).  Transferred to [facility] urgently to the Cath lab for intervention.  Under went cardiac cath and was found to have proximal and mid LAD stenosis. Underwent successful PCI.  Echo revealed EF of 30-35%.  She was treated with ASA, Brilinta, Coreg,  Was already on Lisinopril. Was on statin but was not taking. Increased lipitor to 80 mg.  Given LifeVest.   Discharged home in stable condition with scheduled follow up.  She presents today for follow up.   She states that she is feeling poorly. She complains of fatigue, shortness of breath, dizziness. She also states that she's been experiencing some nausea which she attributes to her Lipitor. She also complains of dry cough which she has had for years. She has never endorses to me previously.   She endorses compliance with her medications. However, she is not using her LifeVest. She does not have it on her today.  Social Hx   Social History   Social History  . Marital Status: Married    Spouse Name: N/A  . Number of Children: N/A  . Years of Education: N/A   Social History Main Topics  . Smoking status: Former Smoker    Types: Cigarettes    Quit date: 04/13/1997  . Smokeless tobacco: Never Used  . Alcohol Use: 0.6 oz/week    1 Glasses of wine, 0 Standard drinks or equivalent, 0 Cans of beer, 0 Shots of liquor per week  . Drug Use: No  . Sexual Activity: No   Other Topics Concern  . None   Social History Narrative   Review of Systems  Constitutional: Positive for fatigue.  Respiratory: Positive for cough and  shortness of breath.   Neurological: Positive for dizziness.   Objective:  BP 110/70 mmHg  Pulse 90  Temp(Src) 97.6 F (36.4 C) (Oral)  Ht 5\' 3"  (1.6 m)  Wt 173 lb 9.6 oz (78.744 kg)  BMI 30.76 kg/m2  SpO2 99%  BP/Weight 03/26/2016 03/26/2016 [id number]  Systolic BP [id number]  Diastolic BP 70 83 55  Wt. (Lbs) 173.6 175 178.2  BMI 30.76 31.01 -   Physical Exam  Constitutional: She is oriented to person, place, and time. She appears well-developed. No distress.  HENT:  Head: Normocephalic and atraumatic.  Eyes: Conjunctivae are normal. No scleral icterus.  Cardiovascular: Normal rate and regular rhythm.   No LE edema.  Pulmonary/Chest: Effort normal and breath sounds normal. She has no wheezes. She has no rales.  Neurological: She is alert and oriented to person, place, and time.  Psychiatric: She has a normal mood and affect.  Vitals reviewed.  Lab Results  Component Value Date   WBC 8.1 03/20/2016   HGB 11.1* 03/20/2016   HCT 35.1* 03/20/2016   PLT 262 03/20/2016   GLUCOSE 121* 03/21/2016   CHOL 227* 03/18/2016   TRIG 112 03/18/2016   HDL 34* 03/18/2016   LDLCALC 171* 03/18/2016   ALT 22 07/28/2015   AST 20 07/28/2015   NA 140 03/21/2016   K 3.9 03/21/2016   CL 105  03/21/2016   CREATININE 0.89 03/21/2016   BUN 22* 03/21/2016   CO2 25 03/21/2016   TSH 0.589 03/18/2016   INR 0.93 03/17/2016   HGBA1C 6.2* 03/18/2016   Assessment & Plan:   Problem List Items Addressed This Visit    Systolic HF (heart failure) ([place])    Appears euvolemic today. Advised compliance with lifevest.       Relevant Medications   rosuvastatin (CRESTOR) 40 MG tablet   isosorbide mononitrate (IMDUR) 30 MG 24 hr tablet   Acute ST elevation myocardial infarction (STEMI) involving left anterior descending coronary artery ([place]) - Primary    Patient with recent STEMI, now s/p PCI x 2. Currently on ASA, Brilinta, Coreg, Lisinopril, Lipitor, Imdur. BP 110/70 and has been experiencing  dizziness. Also appears to have cough from ACE inhibitor. Stopping lisinopril today. Has follow-up with cardiology on 5/24. It BP allows would recommend addition of ARB. Patient feeling nauseated to Lipitor. Switching to Crestor today. Advised compliance with her medications and LifeVest.        Relevant Medications   rosuvastatin (CRESTOR) 40 MG tablet   isosorbide mononitrate (IMDUR) 30 MG 24 hr tablet      Meds ordered this encounter  Medications  . rosuvastatin (CRESTOR) 40 MG tablet    Sig: Take 1 tablet (40 mg total) by mouth daily.    Dispense:  90 tablet    Refill:  3  . isosorbide mononitrate (IMDUR) 30 MG 24 hr tablet    Sig: Take 1 tablet (30 mg total) by mouth daily.    Dispense:  30 tablet    Refill:  5   Follow-up: Return in about 2 weeks (around 04/09/2016) for Follow up Chronic medical issues.  [place]

## 2016-03-27 NOTE — Assessment & Plan Note (Signed)
Appears euvolemic today. Advised compliance with lifevest.

## 2016-03-28 ENCOUNTER — Ambulatory Visit (INDEPENDENT_AMBULATORY_CARE_PROVIDER_SITE_OTHER): Payer: Medicare Other | Admitting: Cardiology

## 2016-03-28 ENCOUNTER — Encounter: Payer: Self-pay | Admitting: Cardiology

## 2016-03-28 VITALS — BP 110/50 | HR 89 | Ht 63.0 in | Wt 172.6 lb

## 2016-03-28 DIAGNOSIS — I5022 Chronic systolic (congestive) heart failure: Secondary | ICD-10-CM

## 2016-03-28 MED ORDER — LOSARTAN POTASSIUM 25 MG PO TABS: 25.0000 mg | ORAL_TABLET | Freq: Every day | ORAL | Status: DC

## 2016-03-28 NOTE — Patient Instructions (Signed)
Medication Instructions:  Your physician has recommended you make the following change in your medication:  START Losartan 25mg  daily. An Rx has been sent to your pharmacy   Labwork: Your physician recommends that you return for lab work in: 1 week ([place])  Please have your primary care physician order fasting labs (lipid,lft) in about 4-6 weeks. Please have a copy of the results forwarded to our office. Fax # [phone]   Testing/Procedures: None ordered  Follow-Up: Your physician recommends that you schedule a follow-up appointment in: 3 weeks with Lyda Jester, PA or another APP  Your physician recommends that you schedule a follow-up appointment with Dr.Cooper first available    Any Other Special Instructions Will Be Listed Below (If Applicable).     If you need a refill on your cardiac medications before your next appointment, please call your pharmacy.

## 2016-03-28 NOTE — Progress Notes (Signed)
03/28/2016 Mahlon Gammon   1951-10-25  [id number]  Primary Physician Coral Spikes, DO Primary Cardiologist: Dr. Burt Knack   Reason for Visit/CC: [facility] F/u for Anterior STEMI   HPI:  65 y.o. female w/ PMHx significant for diabetes, HTN, hyperlipidemia and CAD, who presents to clinic for post hospital f/u after recent admission for acute anterior STEMI. Summarization of hospitalization is outlined below.  Hospital Summary: Patient was taken to [facility] cath lab for emergent LHC on arrival. Procedure was performed by Dr. Burt Knack. She was found to have a proximal LAD dissection treated successfully with primary stenting. The left circumflex and RCA were both widely patent. Moderate segmental LV systolic dysfunction was initially noted at time of cath. Initial estimate was 45%. She tolerated the procedure well and left the cath lab in stable condition. Her CP resolved. She was placed on DAPT with ASA + Brilinta. Also placed on a BB, high intensity statin (LDL elevated at 171 mg/dL) and an ACE-I. Post MI 2D echo was obtained which demonstrated reduction in EF down to 30-35%. There was akinesis of the entire anteroseptal myocardium, consistent with infarction. Doppler parameters were w/c G1DDD. No worrisome ventricular arrhyhtmia were note on telemetry. However, given anterior infarct with low EF of <35%, she was prescribed a LifeVest for primary prevention. Plan is to treat with guidelines directed medical therapy for HF and recheck a f/u echo in 6 weeks. (BB, ACE-I and nitrate. BP too soft to initiate Aldactone and hydralazine at this time). If EF remains <35%, she will need EP referral for an ICD.  Office Visit: Patient reports that she has been doing well. She denies any CP. No dyspnea. No palpiations, dizziness, syncope/ near syncope. No weight gain or edema. Ambulaing well. She reports full compliance with DAPT. No abnormal bleeding. She saw her PCP, Dr. Michael Boston, yesterday and complained of a dry  hacking cough. This was felt related to her ACE-I and lisinopril was discontinued. He also changed her statin from Lipitor to Crestor.   Unfortunately, she has not been fully compliant with her LifeVest due to inconvience. She notes wearing it is "cumbersome". We discussed the importance of this including deliverance of immediate  lifesaving therapies in the event of VT/Vfib/ cardiac arrest, given her severe LFV. She now verbalizes understanding and states that she will improve compliance.      Current Outpatient Prescriptions  Medication Sig Dispense Refill  . aspirin EC 81 MG EC tablet Take 1 tablet (81 mg total) by mouth daily.    . carvedilol (COREG) 6.25 MG tablet Take 1 tablet (6.25 mg total) by mouth 2 (two) times daily with a meal. 60 tablet 5  . dorzolamide-timolol (COSOPT) 22.3-6.8 MG/ML ophthalmic solution Place 1 drop into both eyes 2 (two) times daily.     . furosemide (LASIX) 40 MG tablet Take 1 tablet (40 mg total) by mouth daily. 30 tablet 5  . isosorbide mononitrate (IMDUR) 30 MG 24 hr tablet Take 1 tablet (30 mg total) by mouth daily. 30 tablet 5  . LORazepam (ATIVAN) 0.5 MG tablet Take 0.5 mg by mouth every 4 (four) hours as needed for anxiety or sleep.     . metFORMIN (GLUCOPHAGE) 500 MG tablet Take 1 tablet (500 mg total) by mouth 2 (two) times daily with a meal. 180 tablet 3  . MULTIPLE VITAMIN PO Take 1 tablet by mouth daily.     . nitroGLYCERIN (NITROSTAT) 0.4 MG SL tablet Place 1 tablet (0.4 mg total) under the tongue  every 5 (five) minutes x 3 doses as needed for chest pain. 25 tablet 2  . Omega-3 Fatty Acids (OMEGA 3 PO) Take 1 tablet by mouth daily.    . rosuvastatin (CRESTOR) 40 MG tablet Take 1 tablet (40 mg total) by mouth daily. 90 tablet 3  . ticagrelor (BRILINTA) 90 MG TABS tablet Take 1 tablet (90 mg total) by mouth 2 (two) times daily. 60 tablet 0  . venlafaxine XR (EFFEXOR-XR) 150 MG 24 hr capsule Take 1 capsule (150 mg total) by mouth daily. 30 capsule 4  .  losartan (COZAAR) 25 MG tablet Take 1 tablet (25 mg total) by mouth daily. 30 tablet 5   No current facility-administered medications for this visit.    Allergies  Allergen Reactions  . Meperidine Nausea And Vomiting  . Tetracycline Swelling    Social History   Social History  . Marital Status: Married    Spouse Name: N/A  . Number of Children: N/A  . Years of Education: N/A   Occupational History  . Not on file.   Social History Main Topics  . Smoking status: Former Smoker    Types: Cigarettes    Quit date: 04/13/1997  . Smokeless tobacco: Never Used  . Alcohol Use: 0.6 oz/week    1 Glasses of wine, 0 Standard drinks or equivalent, 0 Cans of beer, 0 Shots of liquor per week  . Drug Use: No  . Sexual Activity: No   Other Topics Concern  . Not on file   Social History Narrative     Review of Systems: General: negative for chills, fever, night sweats or weight changes.  Cardiovascular: negative for chest pain, dyspnea on exertion, edema, orthopnea, palpitations, paroxysmal nocturnal dyspnea or shortness of breath Dermatological: negative for rash Respiratory: negative for cough or wheezing Urologic: negative for hematuria Abdominal: negative for nausea, vomiting, diarrhea, bright red blood per rectum, melena, or hematemesis Neurologic: negative for visual changes, syncope, or dizziness All other systems reviewed and are otherwise negative except as noted above.    Blood pressure 110/50, pulse 89, height 5\' 3"  (1.6 m), weight 172 lb 9.6 oz (78.291 kg), SpO2 98 %.  General appearance: alert, cooperative, no distress and moderately obese Neck: no carotid bruit and no JVD Lungs: clear to auscultation bilaterally Heart: regular rate and rhythm, S1, S2 normal, no murmur, click, rub or gallop Extremities: no LEE Pulses: 2+ and symmetric Skin: warm and dry Neurologic: Grossly normal  EKG NSR. LBBB (chronic). HR 89 bpm.   ASSESSMENT AND PLAN:   1. CAD: s/p lateral  STEMI secondary to LAD dissection. S/p PCI + DES. She denies any recurrent CP. Continued DAPT with ASA + Brilinta, along with high intesity statin (Crestor 40 mg) and BB (Coreg), and LA Nitrate (Imdur). Will add losartan, 25 mg for CAD, HTN and LV systolic dysfunction.   2. Systolic HF/Ischemic Cardiomyopathy: EF post STEMI was 30-35% by echo. Continue BB therapy with Coreg. Her lisinopril was d/c by her PCP yesterday due to intolerance from cough. We will add a low dose ARB, losartan 25 mg. Continue nitrate. She is euvolemic on physical exam. We discussed importance of daily weights, low sodium diet and fluid restriction. We also discussed improving compliance with her LifeVest. Repeat BMP in 1 week after starting ARB. F/u in 2-3 weeks for further medication monitoring. Will try to adjust BB, if BP and HR allows. Plan is to treat with guidelines directed medical therapy for systolic HF + f/u echo. If EF <  35%, she will need referral to EP to consider ICD for primary prevention.   3. HTN: BP is stable but will add low dose ARB for systolic HF. Recheck BMP and BP in 1 week.  4. HLD: LDL 171 mg/dL. She is on a high intensity statin with Crestor 40 mg. Lipids will be followed by her PCP. Recommend rechecking a fasting lipid panel and HFTs in 6 weeks. Goal LDL, given her CAD is <70 mg/dL. If not at goal, she can be referred back to our [facility] for consideration for a PCSK9 inhibitor.   PLAN  F/u in 2-3 weeks for further medication adjustment for systolic HF and f/u for CAD.   Lillian Tigges PA-C 03/28/2016 11:18 AM

## 2016-04-04 ENCOUNTER — Other Ambulatory Visit (INDEPENDENT_AMBULATORY_CARE_PROVIDER_SITE_OTHER): Payer: Medicare Other | Admitting: *Deleted

## 2016-04-04 DIAGNOSIS — I5022 Chronic systolic (congestive) heart failure: Secondary | ICD-10-CM

## 2016-04-04 LAB — BASIC METABOLIC PANEL
BUN: 27 mg/dL — ABNORMAL HIGH (ref 7–25)
CHLORIDE: 104 mmol/L (ref 98–110)
CO2: 24 mmol/L (ref 20–31)
Calcium: 10 mg/dL (ref 8.6–10.4)
Creat: 1.06 mg/dL — ABNORMAL HIGH (ref 0.50–0.99)
Glucose, Bld: 54 mg/dL — ABNORMAL LOW (ref 65–99)
POTASSIUM: 4.1 mmol/L (ref 3.5–5.3)
SODIUM: 139 mmol/L (ref 135–146)

## 2016-04-04 NOTE — Addendum Note (Signed)
Addended by: Rambo Sarafian K on: 04/04/2016 10:29 AM   Modules accepted: Orders  

## 2016-04-10 ENCOUNTER — Ambulatory Visit (INDEPENDENT_AMBULATORY_CARE_PROVIDER_SITE_OTHER): Payer: Medicare Other | Admitting: Family Medicine

## 2016-04-10 ENCOUNTER — Encounter: Payer: Self-pay | Admitting: Family Medicine

## 2016-04-10 VITALS — BP 122/78 | HR 88 | Temp 97.8°F | Ht 63.0 in | Wt 170.0 lb

## 2016-04-10 DIAGNOSIS — E785 Hyperlipidemia, unspecified: Secondary | ICD-10-CM

## 2016-04-10 DIAGNOSIS — I25119 Atherosclerotic heart disease of native coronary artery with unspecified angina pectoris: Secondary | ICD-10-CM

## 2016-04-10 DIAGNOSIS — I5020 Unspecified systolic (congestive) heart failure: Secondary | ICD-10-CM | POA: Diagnosis not present

## 2016-04-10 DIAGNOSIS — E119 Type 2 diabetes mellitus without complications: Secondary | ICD-10-CM

## 2016-04-10 NOTE — Patient Instructions (Signed)
Be sure that you're taking Lipitor 80 mg daily.  Continue your other medications.  We will call regarding your sleep study.  Follow up labs in 2 weeks. Follow up with me in 1-3 months.  Take care  Dr. Lacinda Axon

## 2016-04-10 NOTE — Progress Notes (Signed)
Pre visit review using our clinic review tool, if applicable. No additional management support is needed unless otherwise documented below in the visit note. 

## 2016-04-11 DIAGNOSIS — I2510 Atherosclerotic heart disease of native coronary artery without angina pectoris: Secondary | ICD-10-CM

## 2016-04-11 HISTORY — DX: Atherosclerotic heart disease of native coronary artery without angina pectoris: I25.10

## 2016-04-11 NOTE — Assessment & Plan Note (Signed)
Advised to continue Lipitor 80 mg daily. Will need repeat labs soon.

## 2016-04-11 NOTE — Assessment & Plan Note (Signed)
Stable and at goal. Continue metformin.

## 2016-04-11 NOTE — Assessment & Plan Note (Signed)
Appears stable. Compliant with ASA, Statin, Brilinta, Losartan, Coreg. Continue. Needs close cardiology follow up.

## 2016-04-11 NOTE — Progress Notes (Signed)
Subjective:  Patient ID: Melanie Ray, female    DOB: 08/30/1951  Age: 65 y.o. MRN: [id number]  CC: Follow up  HPI:  65 year old female who is recently status post STEMI presents for follow-up.  CAD s/p PCI following STEMI; Systolic CHF/Ischemic cardiomyopathy  Continues to have some shortness of breath with exertion.  Reports she had mild chest pain last night.  Patient compliant with aspirin, statin (she has switched back to lipitor), Imdur,d, Coreg, losartan.  She is not wearing a LifeVest. I instructed her to do so.  HTN  Recently saw cardiology.  I have stopped her lisinopril due to cough.  I recommended addition of ARB and she is now on Losartan.  Compliant with losartan, Coreg, Imdur.  BP stable and well controlled today.  DM-2  Well controlled on metformin.  HLD  Needs labs in next few weeks.  Compliant with statin, but has switched back to lipitor.  No reports of side effects.  Social Hx   Social History   Social History  . Marital Status: Married    Spouse Name: N/A  . Number of Children: N/A  . Years of Education: N/A   Social History Main Topics  . Smoking status: Former Smoker    Types: Cigarettes    Quit date: 04/13/1997  . Smokeless tobacco: Never Used  . Alcohol Use: 0.6 oz/week    1 Glasses of wine, 0 Standard drinks or equivalent, 0 Cans of beer, 0 Shots of liquor per week  . Drug Use: No  . Sexual Activity: No   Other Topics Concern  . None   Social History Narrative   Review of Systems  Constitutional: Negative.   Respiratory: Positive for shortness of breath.   Cardiovascular: Positive for chest pain.   Objective:  BP 122/78 mmHg  Pulse 88  Temp(Src) 97.8 F (36.6 C) (Oral)  Ht 5\' 3"  (1.6 m)  Wt 170 lb (77.111 kg)  BMI 30.12 kg/m2  SpO2 95%  BP/Weight 04/10/2016 03/28/2016 99991111  Systolic BP 123XX123 [id number] A999333  Diastolic BP 78 50 70  Wt. (Lbs) 170 172.6 173.6  BMI 30.12 30.58 30.76   Physical Exam    Constitutional: She is oriented to person, place, and time. She appears well-developed. No distress.  Cardiovascular: Normal rate and regular rhythm.   2/6 systolic murmur.  Pulmonary/Chest: Effort normal. No respiratory distress. She has no wheezes. She has no rales.  Neurological: She is alert and oriented to person, place, and time.  Psychiatric: She has a normal mood and affect.  Vitals reviewed.  Lab Results  Component Value Date   WBC 8.1 03/20/2016   HGB 11.1* 03/20/2016   HCT 35.1* 03/20/2016   PLT 262 03/20/2016   GLUCOSE 54* 04/04/2016   CHOL 227* 03/18/2016   TRIG 112 03/18/2016   HDL 34* 03/18/2016   LDLCALC 171* 03/18/2016   ALT 22 07/28/2015   AST 20 07/28/2015   NA 139 04/04/2016   K 4.1 04/04/2016   CL 104 04/04/2016   CREATININE 1.06* 04/04/2016   BUN 27* 04/04/2016   CO2 24 04/04/2016   TSH 0.589 03/18/2016   INR 0.93 03/17/2016   HGBA1C 6.2* 03/18/2016    Assessment & Plan:   Problem List Items Addressed This Visit    CAD (coronary artery disease) - Primary    Appears stable. Compliant with ASA, Statin, Brilinta, Losartan, Coreg. Continue. Needs close cardiology follow up.       DM type 2 (diabetes mellitus, type 2) ([place])  Stable and at goal. Continue metformin.      HLD (hyperlipidemia)    Advised to continue Lipitor 80 mg daily. Will need repeat labs soon.      Systolic HF (heart failure) ([facility])    Stable. Euvolemic today. Will need repeat Echo.  Continue current treatment.        Follow-up: Return for Follow up Chronic medical issues. 1-3 months.  [place]

## 2016-04-11 NOTE — Assessment & Plan Note (Signed)
Stable. Euvolemic today. Will need repeat Echo.  Continue current treatment.

## 2016-04-19 ENCOUNTER — Telehealth: Payer: Self-pay | Admitting: *Deleted

## 2016-04-19 ENCOUNTER — Telehealth: Payer: Self-pay

## 2016-04-19 ENCOUNTER — Other Ambulatory Visit: Payer: Self-pay

## 2016-04-19 MED ORDER — TICAGRELOR 90 MG PO TABS: 90.0000 mg | ORAL_TABLET | Freq: Two times a day (BID) | ORAL | Status: DC

## 2016-04-19 NOTE — Telephone Encounter (Signed)
Rx Brilinta samples left at front desk

## 2016-04-19 NOTE — Telephone Encounter (Signed)
Prior auth for Brilinta 90mg  submitted to [facility].

## 2016-04-20 ENCOUNTER — Telehealth: Payer: Self-pay

## 2016-04-20 NOTE — Telephone Encounter (Signed)
Tier exception granted for Brillinta 90mg . This is valid through 11/04/2016.

## 2016-04-23 ENCOUNTER — Telehealth: Payer: Self-pay | Admitting: Family Medicine

## 2016-04-23 ENCOUNTER — Telehealth: Payer: Self-pay | Admitting: *Deleted

## 2016-04-23 NOTE — Telephone Encounter (Signed)
Void, previous note started

## 2016-04-23 NOTE — Telephone Encounter (Signed)
Tried to call [place] back, unable to speak to someone due to not being open.

## 2016-04-23 NOTE — Telephone Encounter (Signed)
Please verify when her appointment is and what is she having done exactly.-per Dr.Cook

## 2016-04-23 NOTE — Telephone Encounter (Signed)
Please fax Dr. Geralynn Ochs response to the dental office, they need his statment in witting.  Fax [phone]

## 2016-04-23 NOTE — Telephone Encounter (Signed)
Okay for fillings. They just need to be aware of increased bleeding.

## 2016-04-23 NOTE — Telephone Encounter (Signed)
Pt's dentist office called today. Pt has an appointment to have fillings done today. They need an ok by Dr. Lacinda Axon to do since pt just had a heart attack. Tammy from [facility], [phone].

## 2016-04-23 NOTE — Telephone Encounter (Signed)
FYI

## 2016-04-23 NOTE — Telephone Encounter (Signed)
Is this something that you would want to give approval for?

## 2016-04-24 ENCOUNTER — Other Ambulatory Visit: Payer: Self-pay | Admitting: Family Medicine

## 2016-04-24 ENCOUNTER — Other Ambulatory Visit (INDEPENDENT_AMBULATORY_CARE_PROVIDER_SITE_OTHER): Payer: Medicare Other

## 2016-04-24 ENCOUNTER — Telehealth: Payer: Self-pay

## 2016-04-24 DIAGNOSIS — E785 Hyperlipidemia, unspecified: Secondary | ICD-10-CM

## 2016-04-24 NOTE — Telephone Encounter (Signed)
Ashleigh to fax.

## 2016-04-24 NOTE — Telephone Encounter (Signed)
Pt came in for blood draw today. Need labs and dx, thank you.

## 2016-04-24 NOTE — Telephone Encounter (Signed)
Information faxed

## 2016-04-24 NOTE — Telephone Encounter (Signed)
Spoke to [employer] [facility] they got the verbal ok yesterday to fill patients teeth with the risk of bleeding.  The dental office is awaiting a fax from Dr. Lacinda Axon with the written order for the "go ahead". Patient already had the procedure yesterday.

## 2016-04-25 LAB — LIPID PANEL
CHOL/HDL RATIO: 4
CHOLESTEROL: 144 mg/dL (ref 0–200)
HDL: 34.2 mg/dL — ABNORMAL LOW (ref >39.00)
LDL CALC: 86 mg/dL (ref 0–99)
NONHDL: 109.47
Triglycerides: 117 mg/dL (ref 0.0–149.0)
VLDL: 23.4 mg/dL (ref 0.0–40.0)

## 2016-04-26 ENCOUNTER — Other Ambulatory Visit: Payer: Self-pay | Admitting: Family Medicine

## 2016-04-26 MED ORDER — ATORVASTATIN CALCIUM 80 MG PO TABS: 80.0000 mg | ORAL_TABLET | Freq: Every day | ORAL | Status: DC

## 2016-04-26 MED ORDER — EZETIMIBE 10 MG PO TABS: 10.0000 mg | ORAL_TABLET | Freq: Every day | ORAL | Status: DC

## 2016-04-30 ENCOUNTER — Ambulatory Visit: Payer: Medicare Other | Admitting: Cardiology

## 2016-05-03 ENCOUNTER — Telehealth: Payer: Self-pay | Admitting: Cardiovascular Disease

## 2016-05-03 ENCOUNTER — Telehealth: Payer: Self-pay | Admitting: *Deleted

## 2016-05-03 MED ORDER — ATORVASTATIN CALCIUM 80 MG PO TABS: 80.0000 mg | ORAL_TABLET | Freq: Every day | ORAL | Status: DC

## 2016-05-03 NOTE — Telephone Encounter (Signed)
New Message   Pt requested to speak w/ RN about rescheduling her post stent f/u w/ Dr Burt Knack- did not want to sched w/ APP Please call back and discuss.

## 2016-05-03 NOTE — Telephone Encounter (Signed)
Refilled to new pharmacy

## 2016-05-03 NOTE — Telephone Encounter (Signed)
Patient has requested to have her cholesterol medication(not sure of name) [facility] to get at a cheaper rate

## 2016-05-03 NOTE — Telephone Encounter (Signed)
Patient unable to make her appointment next week on 05/10/16 with Dr. Burt Knack, because she is going to be out of town. Made an appointment with Lyda Jester PA, but will send message to Dr. Antionette Char nurse to see it there is an appointment on Dr. Antionette Char schedule in the near future after 05/13/16.

## 2016-05-04 NOTE — Telephone Encounter (Signed)
I spoke with the patient to inform her that her medication was sent to her mail order pharmacy [facility].

## 2016-05-04 NOTE — Telephone Encounter (Signed)
Pt left a vm about needing a override for her cholesterol medication. I gave pt msg that's noted on this note. Pt states that it was not the  lipitor medication and she does not have a [employer] that she uses. I checked the medication sheet and the lasted date was on 05/03/16 which is the lipitor, pt states that's not correct.   Pt wanting to know what was the new cholesterol medication that prescribed and how much is it going to cost?   Pharmacy is [facility], [place].  Pt states it is a Rx ready but it is for 200.00 and she wants to know if a Override can be sent in so it can be at a lessor price?   Call pt @ [phone]. Thank you!

## 2016-05-04 NOTE — Telephone Encounter (Signed)
New medication started was ZETIA on 6/22 after having labs drawn on 6/20.   This medication costs her $200 and she cannot afford it.  Is there another medication to substitute? Removed [facility] pharmacy mail order, per her request.  She only wants to use [facility]. See notes below.

## 2016-05-04 NOTE — Telephone Encounter (Signed)
It will be going generic soon. Stick with just the lipitor for now.

## 2016-05-10 ENCOUNTER — Ambulatory Visit: Payer: Medicare Other | Admitting: Cardiovascular Disease

## 2016-05-11 NOTE — Telephone Encounter (Signed)
I have placed this pt on Dr Antionette Char wait list for an appointment.

## 2016-05-28 ENCOUNTER — Encounter: Payer: Self-pay | Admitting: Cardiovascular Disease

## 2016-05-28 ENCOUNTER — Ambulatory Visit (INDEPENDENT_AMBULATORY_CARE_PROVIDER_SITE_OTHER): Payer: Medicare Other | Admitting: Cardiovascular Disease

## 2016-05-28 ENCOUNTER — Other Ambulatory Visit: Payer: Self-pay

## 2016-05-28 VITALS — BP 150/90 | HR 86 | Ht 63.0 in | Wt 171.8 lb

## 2016-05-28 DIAGNOSIS — I5022 Chronic systolic (congestive) heart failure: Secondary | ICD-10-CM

## 2016-05-28 DIAGNOSIS — I25119 Atherosclerotic heart disease of native coronary artery with unspecified angina pectoris: Secondary | ICD-10-CM

## 2016-05-28 MED ORDER — LOSARTAN POTASSIUM 25 MG PO TABS: 25.0000 mg | ORAL_TABLET | Freq: Every day | ORAL | 11 refills | Status: DC

## 2016-05-28 MED ORDER — LOSARTAN POTASSIUM 50 MG PO TABS: 50.0000 mg | ORAL_TABLET | Freq: Every day | ORAL | 0 refills | Status: DC

## 2016-05-28 NOTE — Patient Instructions (Addendum)
Medication Instructions:  Your physician has recommended you make the following change in your medication:  INCREASE Losartan to 50mg  daily. An Rx has been sent to your pharmacy  Labwork: None ordered  Testing/Procedures: Your physician has requested that you have an echocardiogram. Echocardiography is a painless test that uses sound waves to create images of your heart. It provides your doctor with information about the size and shape of your heart and how well your heart's chambers and valves are working. This procedure takes approximately one hour. There are no restrictions for this procedure.   Follow-Up: Your physician recommends that you schedule a follow-up appointment in: 6-8 weeks with Richardson Dopp, PA-C    Any Other Special Instructions Will Be Listed Below (If Applicable).     If you need a refill on your cardiac medications before your next appointment, please call your pharmacy.

## 2016-05-28 NOTE — Telephone Encounter (Signed)
Melanie Ray    losartan (COZAAR) 25 MG tablet Take 1 tablet (25 mg total) by mouth daily   Patient Instructions   Medication Instructions:  Your physician has recommended you make the following change in your medication:  INCREASE Losartan to 50mg  daily. An Rx has been sent to your pharmacy

## 2016-05-28 NOTE — Progress Notes (Signed)
Cardiology Office Note Date:  05/30/2016   ID:  Melanie Ray, DOB 1950-12-19, MRN [id number]  PCP:  Coral Spikes, DO  Cardiologist:  Sherren Mocha, MD    Chief Complaint  Patient presents with  . Follow-up  . Shortness of Breath  . Dizziness     History of Present Illness: Melanie Ray is a 65 y.o. female who presents for hospital follow-up evaluation.   The patient was hospitalized in May 2017 with an acute anterior wall STEMI. She was noted to have critical stenosis of the proximal LAD and was treated with PCI. She had severe LV dysfunction by echo with an LVEF of 30-35% and she was discharged home with a LifeVest for primary prevention. She hasn't been able to tolerate wearing the LifeVest. She brings it in today to return it because she's no longer interested in wearing it at all.   Overall states she feels well. However, she does complain of shortness of breath, dizziness, and occasional headache. Shortness of breath is chronic but worse since her MI. No chest pain. Leg swelling occurs only when eating salty foods. No orthopnea or PND.   Past Medical History:  Diagnosis Date  . Anxiety   . Depression   . Glaucoma   . Hyperlipemia   . Hypertension   . ST elevation myocardial infarction (STEMI) of anterior wall ([place]) 03/17/2016    Past Surgical History:  Procedure Laterality Date  . APPENDECTOMY    . CARDIAC CATHETERIZATION N/A 03/17/2016   Procedure: Left Heart Cath and Coronary Angiography;  Surgeon: Sherren Mocha, MD;  Location: [facility];  Service: Cardiovascular;  Laterality: N/A;  . CARDIAC CATHETERIZATION N/A 03/17/2016   Procedure: Coronary Stent Intervention;  Surgeon: Sherren Mocha, MD;  Location: [place] [facility];  Service: Cardiovascular;  Laterality: N/A;  . SMALL BOWEL REPAIR    . TONSILLECTOMY      Current Outpatient Prescriptions  Medication Sig Dispense Refill  . aspirin EC 81 MG EC tablet Take 1 tablet (81 mg total) by mouth  daily.    [name] atorvastatin (LIPITOR) 80 MG tablet Take 1 tablet (80 mg total) by mouth daily. 90 tablet 3  . carvedilol (COREG) 6.25 MG tablet Take 1 tablet (6.25 mg total) by mouth 2 (two) times daily with a meal. 60 tablet 5  . dorzolamide-timolol (COSOPT) 22.3-6.8 MG/ML ophthalmic solution Place 1 drop into both eyes 2 (two) times daily.     . furosemide (LASIX) 40 MG tablet Take 1 tablet (40 mg total) by mouth daily. 30 tablet 5  . isosorbide mononitrate (IMDUR) 30 MG 24 hr tablet Take 1 tablet (30 mg total) by mouth daily. 30 tablet 5  . LORazepam (ATIVAN) 0.5 MG tablet Take 0.5 mg by mouth every 4 (four) hours as needed for anxiety or sleep.     [name] losartan (COZAAR) 25 MG tablet Take 1 tablet (25 mg total) by mouth daily. 30 tablet 11  . metFORMIN (GLUCOPHAGE) 500 MG tablet Take 1 tablet (500 mg total) by mouth 2 (two) times daily with a meal. 180 tablet 3  . MULTIPLE VITAMIN PO Take 1 tablet by mouth daily.     . nitroGLYCERIN (NITROSTAT) 0.4 MG SL tablet Place 1 tablet (0.4 mg total) under the tongue every 5 (five) minutes x 3 doses as needed for chest pain. 25 tablet 2  . Omega-3 Fatty Acids (OMEGA 3 PO) Take 1 tablet by mouth daily.    . ticagrelor (BRILINTA) 90 MG TABS tablet  Take 1 tablet (90 mg total) by mouth 2 (two) times daily. 60 tablet 11  . venlafaxine XR (EFFEXOR-XR) 150 MG 24 hr capsule Take 1 capsule (150 mg total) by mouth daily. 30 capsule 4  . losartan (COZAAR) 50 MG tablet Take 1 tablet (50 mg total) by mouth daily. 90 tablet 0   No current facility-administered medications for this visit.    Allergies:   Meperidine and Tetracycline   Social History:  The patient  reports that she quit smoking about 19 years ago. Her smoking use included Cigarettes. She has never used smokeless tobacco. She reports that she drinks about 0.6 oz of alcohol per week . She reports that she does not use drugs.   Family History:  The patient's  family history includes Anxiety disorder in her  maternal aunt and mother; Dementia in her mother; Drug abuse in her cousin; Heart failure in her mother; Hypertension in her father and mother; Mood Disorder in her sister; Paranoid behavior in her mother; Stroke in her mother and sister; Tuberculosis in her paternal grandfather.    ROS:  Please see the history of present illness.  Otherwise, review of systems is positive for dizziness, easy bruising, snoring, and headaches.  All other systems are reviewed and negative.    PHYSICAL EXAM: VS:  BP (!) 150/90   Pulse 86   Ht 5\' 3"  (1.6 m)   Wt 171 lb 12.8 oz (77.9 kg)   BMI 30.43 kg/m  , BMI Body mass index is 30.43 kg/m. GEN: Well nourished, well developed, in no acute distress  HEENT: normal  Neck: no JVD, no masses. No carotid bruits Cardiac: RRR without murmur or gallop                Respiratory:  clear to auscultation bilaterally, normal work of breathing GI: soft, nontender, nondistended, + BS MS: no deformity or atrophy  Ext: no pretibial edema, pedal pulses 2+= bilaterally Skin: warm and dry, no rash Neuro:  Strength and sensation are intact Psych: euthymic mood, full affect  EKG:  EKG is not ordered today.  Recent Labs: 07/28/2015: ALT 22 03/18/2016: B Natriuretic Peptide 514.2; TSH 0.589 03/20/2016: Hemoglobin 11.1; Platelets 262 04/04/2016: BUN 27; Creat 1.06; Potassium 4.1; Sodium 139   Lipid Panel     Component Value Date/Time   CHOL 144 04/24/2016 1630   TRIG 117.0 04/24/2016 1630   HDL 34.20 (L) 04/24/2016 1630   CHOLHDL 4 04/24/2016 1630   VLDL 23.4 04/24/2016 1630   LDLCALC 86 04/24/2016 1630      Wt Readings from Last 3 Encounters:  05/28/16 171 lb 12.8 oz (77.9 kg)  04/10/16 170 lb (77.1 kg)  03/28/16 172 lb 9.6 oz (78.3 kg)     Cardiac Studies Reviewed: Cardiac Cath 03-17-2016: Conclusion    LPDA lesion, 30% stenosed.  Ost 1st Diag to 1st Diag lesion, 60% stenosed.  Dist LAD lesion, 50% stenosed.  Prox LAD lesion, 80% stenosed. Post  intervention, there is a 0% residual stenosis.  Mid LAD lesion, 80% stenosed. Post intervention, there is a 0% residual stenosis.  There is mild left ventricular systolic dysfunction.   1. Acute anterolateral ST segment elevation MI with successful stenting of the proximal and mid LAD  2. Widely patent, dominant left circumflex  3. Patent, nondominant RCA  4. Proximal LAD dissection treated successfully with primary stenting  5. Moderate segmental LV systolic dysfunction with LVEF estimated at 45%  The patient was treated with heparin and Aggrastat  single bolus in the cardiac catheterization lab. Will load her with brilinta on arrival to the CCU. Would treat with aspirin and brilinta for at least 12 months. Initiate post MI medical therapy.   2D Echo 03-18-2016: Study Conclusions  - Left ventricle: The cavity size was normal. Wall thickness was   normal. Systolic function was moderately to severely reduced. The   estimated ejection fraction was in the range of 30% to 35%.   Akinesis of the entireanteroseptal myocardium; consistent with   infarction. Doppler parameters are consistent with abnormal left   ventricular relaxation (grade 1 diastolic dysfunction). - Mitral valve: Mildly calcified annulus. There was mild   regurgitation. - Left atrium: The atrium was severely dilated  ASSESSMENT AND PLAN: 1.  Chronic systolic CHF: NYHA II sx's at present. Increase losartan to 50 mg. Repeat 2D echo. Pt understands rationale for Life Vest but not interested in wearing it. Will expedite FU echo to reassess LV function. FU 6-8 weeks with Richardson Dopp for further CHF medication titration.  2. CAD, native vessel: no angina. Continue current Rx with ASA, ticagrelor.   3. Hyperlipidemia: continue Zetia, high-dose atorvastatin  4. HTN: BP stable. Losartan to be increased.  Current medicines are reviewed with the patient today.  The patient does not have concerns regarding  medicines.  Labs/ tests ordered today include:   Orders Placed This Encounter  Procedures  . ECHOCARDIOGRAM COMPLETE   Disposition:   FU as above  Signed, Sherren Mocha, MD  05/30/2016 10:20 PM    [facility] [address], [place], [place]  [id number] Phone: [phone]; Fax: [phone]

## 2016-05-29 ENCOUNTER — Ambulatory Visit: Payer: Medicare Other | Admitting: Cardiovascular Disease

## 2016-05-30 ENCOUNTER — Ambulatory Visit: Payer: Medicare Other | Admitting: Cardiology

## 2016-06-04 ENCOUNTER — Encounter: Payer: Self-pay | Admitting: Physician Assistant

## 2016-06-08 ENCOUNTER — Ambulatory Visit (HOSPITAL_COMMUNITY): Payer: Medicare Other | Attending: Cardiology

## 2016-06-08 ENCOUNTER — Other Ambulatory Visit: Payer: Self-pay

## 2016-06-08 DIAGNOSIS — Z87891 Personal history of nicotine dependence: Secondary | ICD-10-CM | POA: Diagnosis not present

## 2016-06-08 DIAGNOSIS — I2510 Atherosclerotic heart disease of native coronary artery without angina pectoris: Secondary | ICD-10-CM | POA: Insufficient documentation

## 2016-06-08 DIAGNOSIS — I340 Nonrheumatic mitral (valve) insufficiency: Secondary | ICD-10-CM | POA: Insufficient documentation

## 2016-06-08 DIAGNOSIS — I5022 Chronic systolic (congestive) heart failure: Secondary | ICD-10-CM | POA: Diagnosis not present

## 2016-06-08 DIAGNOSIS — I351 Nonrheumatic aortic (valve) insufficiency: Secondary | ICD-10-CM | POA: Diagnosis not present

## 2016-06-08 DIAGNOSIS — E669 Obesity, unspecified: Secondary | ICD-10-CM | POA: Insufficient documentation

## 2016-06-08 DIAGNOSIS — E785 Hyperlipidemia, unspecified: Secondary | ICD-10-CM | POA: Insufficient documentation

## 2016-06-08 DIAGNOSIS — I110 Hypertensive heart disease with heart failure: Secondary | ICD-10-CM | POA: Diagnosis not present

## 2016-06-08 DIAGNOSIS — E119 Type 2 diabetes mellitus without complications: Secondary | ICD-10-CM | POA: Diagnosis not present

## 2016-06-08 DIAGNOSIS — R29898 Other symptoms and signs involving the musculoskeletal system: Secondary | ICD-10-CM | POA: Diagnosis not present

## 2016-06-08 DIAGNOSIS — I509 Heart failure, unspecified: Secondary | ICD-10-CM | POA: Diagnosis present

## 2016-06-08 DIAGNOSIS — Z6830 Body mass index (BMI) 30.0-30.9, adult: Secondary | ICD-10-CM | POA: Insufficient documentation

## 2016-06-08 LAB — ECHOCARDIOGRAM COMPLETE
AVPHT: 337 ms
Ao-asc: 26 cm
CHL CUP DOP CALC LVOT VTI: 14.9 cm
FS: 16 % — AB (ref 28–44)
IV/PV OW: 1.17
LA diam end sys: 38 mm
LA diam index: 2.1 cm/m2
LA vol A4C: 49 ml
LA vol index: 26 mL/m2
LASIZE: 38 mm
LAVOL: 47 mL
LVOT area: 2.54 cm2
LVOTD: 18 mm
LVOTPV: 69.9 cm/s
LVOTSV: 38 mL
MVPKAVEL: 110 m/s
MVPKEVEL: 65.8 m/s
PW: 10.5 mm — AB (ref 0.6–1.1)
RV LATERAL S' VELOCITY: 10.2 cm/s
RV sys press: 37 mmHg
Reg peak vel: 268 cm/s
TR max vel: 268 cm/s

## 2016-06-14 ENCOUNTER — Ambulatory Visit (INDEPENDENT_AMBULATORY_CARE_PROVIDER_SITE_OTHER): Payer: Medicare Other | Admitting: Psychiatry

## 2016-06-14 ENCOUNTER — Encounter: Payer: Self-pay | Admitting: Psychiatry

## 2016-06-14 VITALS — BP 125/71 | HR 93 | Temp 97.5°F | Ht 63.0 in | Wt 169.8 lb

## 2016-06-14 DIAGNOSIS — I25119 Atherosclerotic heart disease of native coronary artery with unspecified angina pectoris: Secondary | ICD-10-CM | POA: Diagnosis not present

## 2016-06-14 DIAGNOSIS — F429 Obsessive-compulsive disorder, unspecified: Secondary | ICD-10-CM

## 2016-06-14 DIAGNOSIS — F331 Major depressive disorder, recurrent, moderate: Secondary | ICD-10-CM | POA: Diagnosis not present

## 2016-06-14 MED ORDER — VENLAFAXINE HCL ER 150 MG PO CP24: 150.0000 mg | ORAL_CAPSULE | Freq: Every day | ORAL | 4 refills | Status: DC

## 2016-06-14 MED ORDER — LORAZEPAM 0.5 MG PO TABS: 0.5000 mg | ORAL_TABLET | Freq: Every day | ORAL | 0 refills | Status: DC

## 2016-06-14 NOTE — Progress Notes (Signed)
Patient ID: Melanie Ray, female   DOB: 04/30/1951, 65 y.o.   MRN: [id number] [facility] MD/PA/NP OP Progress Note  06/14/2016 12:10 PM Elisama Crisologo  MRN:  [id number]  Subjective:  Patient is a 65 year old female who presented for the follow-up appointment. She reported that she had a myocardial infarction in May and was taking to the hospital. She reported that since then she has been feeling depressed and is not following her diet which was suggested to her. She reported that she has been feeling depressed. She is taking her medications regularly. Her sister and her husband are supportive. She takes Ativan on a when necessary basis. She appeared calm and pleasant during the interview. She reported that she will start her diet, regular basis. She denied having any suicidal ideations or plans.  She moved to [place] to be close to her family members and enjoying her life. She reported that she does not want to be sick and wants to enjoy her life at this time.  Chief Complaint:  Chief Complaint    Follow-up; Medication Refill     Visit Diagnosis:     ICD-9-CM ICD-10-CM   1. Major depressive disorder, recurrent episode, moderate (HCC) 296.32 F33.1   2. OCD (obsessive compulsive disorder) 300.3 F42.9     Past Medical History:  Past Medical History:  Diagnosis Date  . Anxiety   . Depression   . Glaucoma   . Hyperlipemia   . Hypertension   . ST elevation myocardial infarction (STEMI) of anterior wall ([place]) 03/17/2016    Past Surgical History:  Procedure Laterality Date  . APPENDECTOMY    . CARDIAC CATHETERIZATION N/A 03/17/2016   Procedure: Left Heart Cath and Coronary Angiography;  Surgeon: Sherren Mocha, MD;  Location: [place] [facility];  Service: Cardiovascular;  Laterality: N/A;  . CARDIAC CATHETERIZATION N/A 03/17/2016   Procedure: Coronary Stent Intervention;  Surgeon: Sherren Mocha, MD;  Location: [place] [facility];  Service: Cardiovascular;  Laterality: N/A;  . SMALL  BOWEL REPAIR    . TONSILLECTOMY     Family History:  Family History  Problem Relation Age of Onset  . Anxiety disorder Mother   . Paranoid behavior Mother   . Hypertension Mother   . Heart failure Mother   . Stroke Mother   . Dementia Mother   . Hypertension Father   . Mood Disorder Sister   . Stroke Sister   . Anxiety disorder Maternal Aunt   . Drug abuse Cousin   . Tuberculosis Paternal Grandfather    Social History:  Social History   Social History  . Marital status: Married    Spouse name: N/A  . Number of children: N/A  . Years of education: N/A   Social History Main Topics  . Smoking status: Former Smoker    Types: Cigarettes    Quit date: 04/13/1997  . Smokeless tobacco: Never Used  . Alcohol use 0.6 oz/week    1 Glasses of wine per week  . Drug use: No  . Sexual activity: No   Other Topics Concern  . None   Social History Narrative  . None   Additional History:   Assessment:   Musculoskeletal: Strength & Muscle Tone: within normal limits Gait & Station: normal Patient leans: N/A  Psychiatric Specialty Exam: HPI  Review of Systems  Psychiatric/Behavioral: Negative for depression, hallucinations, memory loss, substance abuse and suicidal ideas. The patient is nervous/anxious. The patient does not have insomnia.   All other systems reviewed and  are negative.   Blood pressure 125/71, pulse 93, temperature 97.5 F (36.4 C), temperature source Oral, height 5\' 3"  (1.6 m), weight 169 lb 12.8 oz (77 kg).Body mass index is 30.08 kg/m.  General Appearance: Neat and Well Groomed  Eye Contact:  Good  Speech:  Clear and Coherent and Normal Rate  Volume:  Normal  Mood:  Good  Affect:  Congruent  Thought Process:  Linear and Logical  Orientation:  Full (Time, Place, and Person)  Thought Content:  Negative  Suicidal Thoughts:  No  Homicidal Thoughts:  No  Memory:  Immediate;   Good Recent;   Good Remote;   Good  Judgement:  Good  Insight:  Good   Psychomotor Activity:  Negative  Concentration:  Good  Recall:  Good  Fund of Knowledge: Good  Language: Good  Akathisia:  Negative  Handed:  Right unknown  AIMS (if indicated):  Not done  Assets:  Communication Skills Desire for Improvement Leisure Time  ADL's:  Intact  Cognition: WNL  Sleep:  Good   Is the patient at risk to self?  No. Has the patient been a risk to self in the past 6 months?  No. Has the patient been a risk to self within the distant past?  No. Is the patient a risk to others?  No. Has the patient been a risk to others in the past 6 months?  No. Has the patient been a risk to others within the distant past?  No.  Current Medications: Current Outpatient Prescriptions  Medication Sig Dispense Refill  . aspirin EC 81 MG EC tablet Take 1 tablet (81 mg total) by mouth daily.    [name] atorvastatin (LIPITOR) 80 MG tablet Take 1 tablet (80 mg total) by mouth daily. 90 tablet 3  . carvedilol (COREG) 6.25 MG tablet Take 1 tablet (6.25 mg total) by mouth 2 (two) times daily with a meal. 60 tablet 5  . dorzolamide-timolol (COSOPT) 22.3-6.8 MG/ML ophthalmic solution Place 1 drop into both eyes 2 (two) times daily.     . furosemide (LASIX) 40 MG tablet Take 1 tablet (40 mg total) by mouth daily. 30 tablet 5  . isosorbide mononitrate (IMDUR) 30 MG 24 hr tablet Take 1 tablet (30 mg total) by mouth daily. 30 tablet 5  . LORazepam (ATIVAN) 0.5 MG tablet Take 0.5 mg by mouth every 4 (four) hours as needed for anxiety or sleep.     [name] losartan (COZAAR) 25 MG tablet Take 1 tablet (25 mg total) by mouth daily. 30 tablet 11  . losartan (COZAAR) 50 MG tablet Take 1 tablet (50 mg total) by mouth daily. 90 tablet 0  . metFORMIN (GLUCOPHAGE) 500 MG tablet Take 1 tablet (500 mg total) by mouth 2 (two) times daily with a meal. 180 tablet 3  . MULTIPLE VITAMIN PO Take 1 tablet by mouth daily.     . nitroGLYCERIN (NITROSTAT) 0.4 MG SL tablet Place 1 tablet (0.4 mg total) under the tongue every  5 (five) minutes x 3 doses as needed for chest pain. 25 tablet 2  . Omega-3 Fatty Acids (OMEGA 3 PO) Take 1 tablet by mouth daily.    . ticagrelor (BRILINTA) 90 MG TABS tablet Take 1 tablet (90 mg total) by mouth 2 (two) times daily. 60 tablet 11  . venlafaxine XR (EFFEXOR-XR) 150 MG 24 hr capsule Take 1 capsule (150 mg total) by mouth daily. 30 capsule 4   No current facility-administered medications for this visit.  Medical Decision Making:  Established Problem, Stable/Improving (1)  Treatment Plan Summary:Medication management   Patient will continue on Effexor XR 150 mg in the morning. She cannot for medication 2 days ago. She will also continue on Ativan and when necessary basis. She was given 30 pills at this time.   More than 50% of the time spent in psychoeducation, counseling and coordination of care.    This note was generated in part or whole with voice recognition software. Voice regonition is usually quite accurate but there are transcription errors that can and very often do occur. I apologize for any typographical errors that were not detected and corrected.   Follow-up in 2 months.   More than 50% of the time spent in psychoeducation, counseling and coordination of care.    This note was generated in part or whole with voice recognition software. Voice regonition is usually quite accurate but there are transcription errors that can and very often do occur. I apologize for any typographical errors that were not detected and corrected.    Rainey Pines, MD 06/14/2016, 12:10 PM

## 2016-06-15 DIAGNOSIS — H40003 Preglaucoma, unspecified, bilateral: Secondary | ICD-10-CM | POA: Diagnosis not present

## 2016-06-28 ENCOUNTER — Institutional Professional Consult (permissible substitution): Payer: Medicare Other | Admitting: Internal Medicine

## 2016-07-11 ENCOUNTER — Ambulatory Visit: Payer: Medicare Other | Admitting: Family Medicine

## 2016-07-16 ENCOUNTER — Encounter: Payer: Self-pay | Admitting: Internal Medicine

## 2016-07-16 ENCOUNTER — Ambulatory Visit (INDEPENDENT_AMBULATORY_CARE_PROVIDER_SITE_OTHER): Payer: Medicare Other | Admitting: Internal Medicine

## 2016-07-16 VITALS — BP 148/82 | HR 79 | Ht 63.0 in | Wt 171.6 lb

## 2016-07-16 DIAGNOSIS — I5022 Chronic systolic (congestive) heart failure: Secondary | ICD-10-CM | POA: Diagnosis not present

## 2016-07-16 DIAGNOSIS — I447 Left bundle-branch block, unspecified: Secondary | ICD-10-CM | POA: Diagnosis not present

## 2016-07-16 DIAGNOSIS — I10 Essential (primary) hypertension: Secondary | ICD-10-CM

## 2016-07-16 NOTE — Progress Notes (Signed)
Electrophysiology Office Note   Date:  07/16/2016   ID:  Melanie Ray, DOB 02-21-1951, MRN [id number]  PCP:  Coral Spikes, DO  Cardiologist:  Dr Burt Knack Primary Electrophysiologist: Thompson Grayer, MD    CC: CHF   History of Present Illness: Melanie Ray is a 65 y.o. female who presents today for electrophysiology evaluation.   She had acute MI 03/17/16 for which she was found to have critical stenosis of the proximal LAD.  This was treated with PCI.  She was noted to have EF of 30-35%.  She was initiated on medical therapy and given a lifevest.  She was unable to tolerate lifevest.  She ha received additional outpatient medicine optimization by Dr Burt Knack.  Despite medical therapy, her EF remains depressed at 25% 06/08/16 by echo.  She is referred for further CV risk stratification.   She reports that she feels well.  She has SOB with moderate activity.  She finds that she has SOB first thing each morning and also at night. She reports that she can walk 1/4 mile before having to stop.  Today, she denies symptoms of palpitations, chest pain,  lower extremity edema, claudication, dizziness, presyncope, syncope, bleeding, or neurologic sequela. The patient is tolerating medications without difficulties and is otherwise without complaint today.    Past Medical History:  Diagnosis Date  . Anxiety   . Depression   . Diabetes mellitus type 2 in obese ([place])   . Glaucoma   . Hyperlipemia   . Hypertension   . Left bundle branch block   . ST elevation myocardial infarction (STEMI) of anterior wall ([place]) 03/17/2016   Past Surgical History:  Procedure Laterality Date  . APPENDECTOMY    . CARDIAC CATHETERIZATION N/A 03/17/2016   Procedure: Left Heart Cath and Coronary Angiography;  Surgeon: Sherren Mocha, MD;  Location: [place] [facility];  Service: Cardiovascular;  Laterality: N/A;  . CARDIAC CATHETERIZATION N/A 03/17/2016   Procedure: Coronary Stent Intervention;  Surgeon: Sherren Mocha, MD;  Location: [place] [facility];  Service: Cardiovascular;  Laterality: N/A;  . SMALL BOWEL REPAIR    . TONSILLECTOMY    . UTERINE FIBROID SURGERY       Current Outpatient Prescriptions  Medication Sig Dispense Refill  . aspirin EC 81 MG EC tablet Take 1 tablet (81 mg total) by mouth daily.    [name] atorvastatin (LIPITOR) 80 MG tablet Take 1 tablet (80 mg total) by mouth daily. 90 tablet 3  . carvedilol (COREG) 6.25 MG tablet Take 1 tablet (6.25 mg total) by mouth 2 (two) times daily with a meal. 60 tablet 5  . dorzolamide-timolol (COSOPT) 22.3-6.8 MG/ML ophthalmic solution Place 1 drop into both eyes 2 (two) times daily.     . furosemide (LASIX) 40 MG tablet Take 1 tablet (40 mg total) by mouth daily. 30 tablet 5  . isosorbide mononitrate (IMDUR) 30 MG 24 hr tablet Take 1 tablet (30 mg total) by mouth daily. 30 tablet 5  . LORazepam (ATIVAN) 0.5 MG tablet Take 1 tablet (0.5 mg total) by mouth at bedtime. 30 tablet 0  . losartan (COZAAR) 50 MG tablet Take 1 tablet (50 mg total) by mouth daily. 90 tablet 0  . metFORMIN (GLUCOPHAGE) 500 MG tablet Take 1 tablet (500 mg total) by mouth 2 (two) times daily with a meal. 180 tablet 3  . nitroGLYCERIN (NITROSTAT) 0.4 MG SL tablet Place 1 tablet (0.4 mg total) under the tongue every 5 (five) minutes x 3 doses  as needed for chest pain. 25 tablet 2  . ticagrelor (BRILINTA) 90 MG TABS tablet Take 1 tablet (90 mg total) by mouth 2 (two) times daily. 60 tablet 11  . venlafaxine XR (EFFEXOR-XR) 150 MG 24 hr capsule Take 1 capsule (150 mg total) by mouth daily. 30 capsule 4   No current facility-administered medications for this visit.     Allergies:   Meperidine and Tetracycline   Social History:  The patient  reports that she quit smoking about 19 years ago. Her smoking use included Cigarettes. She has never used smokeless tobacco. She reports that she drinks about 0.6 oz of alcohol per week . She reports that she does not use drugs.   Family  History:  The patient's  family history includes Anxiety disorder in her maternal aunt and mother; Dementia in her mother; Drug abuse in her cousin; Heart failure in her mother; Hypertension in her father and mother; Mood Disorder in her sister; Paranoid behavior in her mother; Stroke in her mother and sister; Tuberculosis in her paternal grandfather.    ROS:  Please see the history of present illness.   All other systems are reviewed and negative.    PHYSICAL EXAM: VS:  BP (!) 148/82   Pulse 79   Ht 5\' 3"  (1.6 m)   Wt 171 lb 9.6 oz (77.8 kg)   BMI 30.40 kg/m  , BMI Body mass index is 30.4 kg/m. GEN: overweight, in no acute distress  HEENT: normal  Neck: no JVD, carotid bruits, or masses Cardiac: RRR; no murmurs, rubs, or gallops,no edema  Respiratory:  clear to auscultation bilaterally, normal work of breathing GI: soft, nontender, nondistended, + BS MS: no deformity or atrophy  Skin: warm and dry  Neuro:  Strength and sensation are intact Psych: euthymic mood, full affect  EKG:  EKG is ordered today. The ekg ordered today shows sinus rhythm 79 bpm, pr 146 msec, LBBB (QRS 146 msec)   Recent Labs: 07/28/2015: ALT 22 03/18/2016: B Natriuretic Peptide 514.2; TSH 0.589 03/20/2016: Hemoglobin 11.1; Platelets 262 04/04/2016: BUN 27; Creat 1.06; Potassium 4.1; Sodium 139    Lipid Panel     Component Value Date/Time   CHOL 144 04/24/2016 1630   TRIG 117.0 04/24/2016 1630   HDL 34.20 (L) 04/24/2016 1630   CHOLHDL 4 04/24/2016 1630   VLDL 23.4 04/24/2016 1630   LDLCALC 86 04/24/2016 1630     Wt Readings from Last 3 Encounters:  07/16/16 171 lb 9.6 oz (77.8 kg)  06/14/16 169 lb 12.8 oz (77 kg)  05/28/16 171 lb 12.8 oz (77.9 kg)      Other studies Reviewed: Additional studies/ records that were reviewed today include: cath, prior echos, Dr York Cerise notes  Review of the above records today demonstrates: as above   ASSESSMENT AND PLAN:  The patient has an ischemic CM (EF  25%), NYHA Class III CHF, and CAD. At this time, she meets MADIT II/ SCD-HeFT criteria for ICD implantation for primary prevention of sudden death. Given LBBB, she also meets criteria for CRT.  Risks, benefits, alternatives to BiVICD implantation were discussed in detail with the patient today. The patient  understands that the risks include but are not limited to bleeding, infection, pneumothorax, perforation, tamponade, vascular damage, renal failure, MI, stroke, death, inappropriate shocks, and lead dislodgement and wishes to think about this further.  If she decides to proceed, she will contact my office. I would prefer that we consider switching brilinta prior to the procedure  if ok with Dr Burt Knack in order to reduce bleeding risks. If she decides to avoid an ICD then she should follow closely with Dr Burt Knack and I will see as needed going forward.   Current medicines are reviewed at length with the patient today.   The patient does not have concerns regarding her medicines.  The following changes were made today:  none  Signed, Thompson Grayer, MD  07/16/2016 11:59 AM     [facility] [address] [place] [address] [phone] (office) [phone] (fax)

## 2016-07-16 NOTE — Patient Instructions (Signed)
Medication Instructions:  Your physician recommends that you continue on your current medications as directed. Please refer to the Current Medication list given to you today.   Labwork: None ordered   Testing/Procedures: Your physician has recommended that you have a defibrillator inserted. An implantable cardioverter defibrillator (ICD) is a small device that is placed in your chest or, in rare cases, your abdomen. This device uses electrical pulses or shocks to help control life-threatening, irregular heartbeats that could lead the heart to suddenly stop beating (sudden cardiac arrest). Leads are attached to the ICD that goes into your heart. This is done in the hospital and usually requires an overnight stay. Please see the instruction sheet given to you today for more information.  Please call back if you decide to proceed with ICD Will need to change Brilinta to Plavix 7 days prior to the procedure  Follow-Up:  Your physician recommends that you schedule a follow-up appointment as needed with Dr Rayann Heman    Cardioverter Defibrillator Implantation An implantable cardioverter defibrillator (ICD) is a small, lightweight, battery-powered device that is placed (implanted) under the skin in the chest or abdomen. Your caregiver may prescribe an ICD if:  You have had an irregular heart rhythm (arrhythmia) that originated in the lower chambers of the heart (ventricles).  Your heart has been damaged by a disease (such as coronary artery disease) or heart condition (such as a heart attack). An ICD consists of a battery that lasts several years, a small computer called a pulse generator, and wires called leads that go into the heart. It is used to detect and correct two dangerous arrhythmias: a rapid heart rhythm (tachycardia) and an arrhythmia in which the ventricles contract in an uncoordinated way (fibrillation). When an ICD detects tachycardia, it sends an electrical signal to the heart that  restores the heartbeat to normal (cardioversion). This signal is usually painless. If cardioversion does not work or if the ICD detects fibrillation, it delivers a small electrical shock to the heart (defibrillation) to restart the heart. The shock may feel like a strong jolt in the chest.ICDs may be programmed to correct other problems. Sometimes, ICDs are programmed to act as another type of implantable device called a pacemaker. Pacemakers are used to treat a slow heartbeat (bradycardia). LET YOUR CAREGIVER KNOW ABOUT:  Any allergies you have.  All medicines you are taking, including vitamins, herbs, eyedrops, and over-the-counter medicines and creams.  Previous problems you or members of your family have had with the use of anesthetics.  Any blood disorders you have had.  Other health problems you have. RISKS AND COMPLICATIONS Generally, the procedure to implant an ICD is safe. However, as with any surgical procedure, complications can occur. Possible complications associated with implanting an ICD include:  Swelling, bleeding, or bruising at the site where the ICD was implanted.  Infection at the site where the ICD was implanted.  A reaction to medicine used during the procedure.  Nerve, heart, or blood vessel damage.  Blood clots. BEFORE THE PROCEDURE  You may need to have blood tests, heart tests, or a chest X-ray done before the day of the procedure.  Ask your caregiver about changing or stopping your regular medicines.  Make plans to have someone drive you home. You may need to stay in the hospital overnight after the procedure.  Stop smoking at least 24 hours before the procedure.  Take a bath or shower the night before the procedure. You may need to scrub your chest or  abdomen with a special type of soap.  Do not eat or drink before your procedure for as long as directed by your caregiver. Ask if it is okay to take any needed medicine with a small sip of  water. PROCEDURE  The procedure to implant an ICD in your chest or abdomen is usually done at a hospital in a room that has a large X-ray machine called a fluoroscope. The machine will be above you during the procedure. It will help your caregiver see your heart during the procedure. Implanting an ICD usually takes 1-3 hours. Before the procedure:   Small monitors will be put on your body. They will be used to check your heart, blood pressure, and oxygen level.  A needle will be put into a vein in your hand or arm. This is called an intravenous (IV) access tube. Fluids and medicine will flow directly into your body through the IV tube.  Your chest or abdomen will be cleaned with a germ-killing (antiseptic) solution. The area may be shaved.  You may be given medicine to help you relax (sedative).  You will be given a medicine called a local anesthetic. This medicine will make the surgical site numb while the ICD is implanted. You will be sleepy but awake during the procedure. After you are numb the procedure will begin. The caregiver will:  Make a small cut (incision). This will make a pocket deep under your skin that will hold the pulse generator.  Guide the leads through a large blood vessel into your heart and attach them to the heart muscles. Depending on the ICD, the leads may go into one ventricle or they may go to both ventricles and into an upper chamber of the heart (atrium).  Test the ICD.  Close the incision with stitches, glue, or staples. AFTER THE PROCEDURE  You may feel pain. Some pain is normal. It may last a few days.  You may stay in a recovery area until the local anesthetic has worn off. Your blood pressure and pulse will be checked often. You will be taken to a room where your heart will be monitored.  A chest X-ray will be taken. This is done to check that the cardioverter defibrillator is in the right place.  You may stay in the hospital overnight.  A slight bump  may be seen over the skin where the ICD was placed. Sometimes, it is possible to feel the ICD under the skin. This is normal.  In the months and years afterward, your caregiver will check the device, the leads, and the battery every few months. Eventually, when the battery is low, the ICD will be replaced.   This information is not intended to replace advice given to you by your health care provider. Make sure you discuss any questions you have with your health care provider.   Document Released: 07/14/2002 Document Revised: 08/12/2013 Document Reviewed: 11/10/2012 Elsevier Interactive Patient Education [employer].

## 2016-07-17 NOTE — Progress Notes (Signed)
thx Jeneen Rinks. Will switch to plavix if she decides to proceed.

## 2016-07-26 ENCOUNTER — Ambulatory Visit: Payer: Medicare Other | Admitting: Physician Assistant

## 2016-07-30 NOTE — Progress Notes (Signed)
Cardiology Office Note:    Date:  07/31/2016   ID:  Melanie Ray, DOB [birth year], MRN [id number]  PCP:  Coral Spikes, DO  Cardiologist:  Dr. Sherren Mocha   Electrophysiologist:  Dr. Thompson Grayer   Referring MD: Coral Spikes, DO   Chief Complaint  Patient presents with  . Follow-up    CHF    History of Present Illness:    Melanie Ray is a 65 y.o. female with a hx of CAD s/p anterior STEMI in 5/17 tx with DES x 2 to LAD.  EF was 30-35% by Echo and she was DC on LifeVest.  However, she was no longer interested in wearing it at FU. Last seen by Dr. Sherren Mocha in 7/17.  Repeat Echo in 8/17 demonstrated an EF 25-30%.  She was set up to see Dr. Thompson Grayer for EP evaluation.  BiV-ICD implantation was recommended and the patieant wanted to think about this prior to proceeding.     She returns for FU on CHF.  Here alone.  She has decided against proceeding with ICD implantation.  She does note dyspnea with mod to extreme activities.  She denies any significant worsening.  She does note shortness of breath in the AM that improves with taking her medications.  She thought this may be related to her Brilinta.  She denies any edema or weight gain.  She denies chest pain or syncope.  She denies orthopnea, PND.   Prior CV studies that were reviewed today include:    Echo 06/08/16 Mild focal basal septal hypertrophy, EF 25-30%, diff HK, ant-septal AK, Gr 1 DD, mild AI, MAC, mild MR, PASP 37 mmHg   Echo 03/18/16 EF 30-35%, ant-septal AK, Gr 1 DD, mild MR, severe LAE.  LHC 03/17/16 LAD proximal 80%, mid 80%, distal 50%, ostial D1 60%   LCx with LPDA lesion, 30%  RCA Mild calcification with no significant stenosis in a medium caliber, nondominant RCA LVEF is estimated at 45% with inferoapical and lateral wall akinesis PCI: PCI: 3.5 x 24 mm Promus DES to prox LAD, 2.5 x 12 mm Promus DES to mid LAD.  1. Acute anterolateral ST segment elevation MI with successful stenting of the  proximal and mid LAD 2. Widely patent, dominant left circumflex 3. Patent, nondominant RCA 4. Proximal LAD dissection treated successfully with primary stenting 5. Moderate segmental LV systolic dysfunction with LVEF estimated at 45%    Past Medical History:  Diagnosis Date  . Anxiety   . Depression   . Diabetes mellitus type 2 in obese ([place])   . Glaucoma   . Hyperlipemia   . Hypertension   . Left bundle branch block   . ST elevation myocardial infarction (STEMI) of anterior wall ([place]) 03/17/2016    Past Surgical History:  Procedure Laterality Date  . APPENDECTOMY    . CARDIAC CATHETERIZATION N/A 03/17/2016   Procedure: Left Heart Cath and Coronary Angiography;  Surgeon: Sherren Mocha, MD;  Location: [facility];  Service: Cardiovascular;  Laterality: N/A;  . CARDIAC CATHETERIZATION N/A 03/17/2016   Procedure: Coronary Stent Intervention;  Surgeon: Sherren Mocha, MD;  Location: [facility];  Service: Cardiovascular;  Laterality: N/A;  . SMALL BOWEL REPAIR    . TONSILLECTOMY    . UTERINE FIBROID SURGERY      Current Medications: Outpatient Medications Prior to Visit  Medication Sig Dispense Refill  . aspirin EC 81 MG EC tablet Take 1 tablet (81 mg total) by mouth daily.    [name]  atorvastatin (LIPITOR) 80 MG tablet Take 1 tablet (80 mg total) by mouth daily. 90 tablet 3  . dorzolamide-timolol (COSOPT) 22.3-6.8 MG/ML ophthalmic solution Place 1 drop into both eyes 2 (two) times daily.     . furosemide (LASIX) 40 MG tablet Take 1 tablet (40 mg total) by mouth daily. 30 tablet 5  . isosorbide mononitrate (IMDUR) 30 MG 24 hr tablet Take 1 tablet (30 mg total) by mouth daily. 30 tablet 5  . LORazepam (ATIVAN) 0.5 MG tablet Take 1 tablet (0.5 mg total) by mouth at bedtime. 30 tablet 0  . losartan (COZAAR) 50 MG tablet Take 1 tablet (50 mg total) by mouth daily. 90 tablet 0  . metFORMIN (GLUCOPHAGE) 500 MG tablet Take 1 tablet (500 mg total) by mouth 2 (two) times daily with  a meal. 180 tablet 3  . nitroGLYCERIN (NITROSTAT) 0.4 MG SL tablet Place 1 tablet (0.4 mg total) under the tongue every 5 (five) minutes x 3 doses as needed for chest pain. 25 tablet 2  . ticagrelor (BRILINTA) 90 MG TABS tablet Take 1 tablet (90 mg total) by mouth 2 (two) times daily. 60 tablet 11  . venlafaxine XR (EFFEXOR-XR) 150 MG 24 hr capsule Take 1 capsule (150 mg total) by mouth daily. 30 capsule 4  . carvedilol (COREG) 6.25 MG tablet Take 1 tablet (6.25 mg total) by mouth 2 (two) times daily with a meal. 60 tablet 5   No facility-administered medications prior to visit.       Allergies:   Meperidine and Tetracycline   Social History   Social History  . Marital status: Married    Spouse name: N/A  . Number of children: N/A  . Years of education: N/A   Social History Main Topics  . Smoking status: Former Smoker    Types: Cigarettes    Quit date: 04/13/1997  . Smokeless tobacco: Never Used  . Alcohol use 0.6 oz/week    1 Glasses of wine per week     Comment: 2-3 glasses of wine per month  . Drug use: No  . Sexual activity: No   Other Topics Concern  . None   Social History Narrative   Lives in [place] with spouse.  No children.   Retired first [employer] for over 30 years ([place] for 10 years and then in [place] for over 20 years).        Family History:  The patient's family history includes Anxiety disorder in her maternal aunt and mother; Dementia in her mother; Drug abuse in her cousin; Heart failure in her mother; Hypertension in her father and mother; Mood Disorder in her sister; Paranoid behavior in her mother; Stroke in her mother and sister; Tuberculosis in her paternal grandfather.   ROS:   Please see the history of present illness.    Review of Systems  Constitution: Positive for diaphoresis.  HENT: Positive for headaches.   Cardiovascular: Positive for dyspnea on exertion.  Respiratory: Positive for shortness of breath and snoring.     Hematologic/Lymphatic: Bruises/bleeds easily.   All other systems reviewed and are negative.   EKGs/Labs/Other Test Reviewed:    EKG:  EKG is  ordered today.  The ekg ordered today demonstrates NSR, HR 78, LBBB, no changes.   Recent Labs: 03/18/2016: B Natriuretic Peptide 514.2; TSH 0.589 03/20/2016: Hemoglobin 11.1; Platelets 262 04/04/2016: BUN 27; Creat 1.06; Potassium 4.1; Sodium 139   Recent Lipid Panel    Component Value Date/Time   CHOL 144  04/24/2016 1630   TRIG 117.0 04/24/2016 1630   HDL 34.20 (L) 04/24/2016 1630   CHOLHDL 4 04/24/2016 1630   VLDL 23.4 04/24/2016 1630   LDLCALC 86 04/24/2016 1630     Physical Exam:    VS:  BP 130/70   Pulse 78   Ht 5\' 3"  (1.6 m)   Wt 170 lb (77.1 kg)   BMI 30.11 kg/m     Wt Readings from Last 3 Encounters:  07/31/16 170 lb (77.1 kg)  07/16/16 171 lb 9.6 oz (77.8 kg)  06/14/16 169 lb 12.8 oz (77 kg)     Physical Exam  Constitutional: She is oriented to person, place, and time. She appears well-developed and well-nourished. No distress.  HENT:  Head: Normocephalic and atraumatic.  Eyes: No scleral icterus.  Neck: No JVD present.  Cardiovascular: Normal rate, regular rhythm and normal heart sounds.   No murmur heard. Pulmonary/Chest: Effort normal. She has no wheezes. She has no rales.  Abdominal: Soft. There is no tenderness.  Musculoskeletal: She exhibits no edema.  Neurological: She is alert and oriented to person, place, and time.  Skin: Skin is warm and dry.  Psychiatric: She has a normal mood and affect.    ASSESSMENT:    1. Chronic systolic heart failure ([place])   2. Ischemic cardiomyopathy   3. Coronary artery disease involving native coronary artery of native heart without angina pectoris   4. Essential hypertension   5. HLD (hyperlipidemia)    PLAN:    In order of problems listed above:  1. Chronic systolic HF - NYHA 2.  She does note shortness of breath that improves with her medications in the AM.  I  do not think this is related to her Brilinta.  She does not look particularly volume overloaded.  Her HR remains > 70.  She would benefit from increasing her beta-blocker.  She would also benefit from adding Spironolactone and changing Losartan to Entresto.    -  Increase Coreg to 9.375 mg bid  -  BMET, BNP today.  Increase Lasix if BNP high.  -  Consider adding Spironolactone at FU.  -  Will check on cost of Entresto for her and plan on initiating this soon as well.   2. Ischemic CM - She is not interested in ICD implantation.  She tells me she understood everything Dr. Thompson Grayer told her as it related to her ICM.    3. CAD - s/p anterior STEMI in 5/17 tx with DES x 2 to LAD.  No angina.  Continue ASA, Brilinta, statin, beta-blocker.  4. HTN - Controlled.   5. HL - LDL was 86 in 6/17 (previously 171; ie 50% reduction).  Continue Lipitor 80.     Medication Adjustments/Labs and Tests Ordered: Current medicines are reviewed at length with the patient today.  Concerns regarding medicines are outlined above.  Medication changes, Labs and Tests ordered today are outlined in the Patient Instructions noted below. Patient Instructions  Medication Instructions:  1. INCREASE COREG TO 9.375 MG TWICE DAILY (THIS WILL BE 1 AND 1/2 TABLETS TWICE DAILY)  Labwork: 1. TODAY BMET, BNP  Testing/Procedures: NONE  Follow-Up: 08/21/16 @ 1:45 WITH SCOTT WEAVER, PAC   Any Other Special Instructions Will Be Listed Below (If Applicable).  If you need a refill on your cardiac medications before your next appointment, please call your pharmacy.  Signed, Richardson Dopp, PA-C  07/31/2016 2:33 PM    [facility]  [address], [place], [place]  [address] Phone: [phone]; Fax: [phone]

## 2016-07-31 ENCOUNTER — Encounter (INDEPENDENT_AMBULATORY_CARE_PROVIDER_SITE_OTHER): Payer: Self-pay

## 2016-07-31 ENCOUNTER — Encounter: Payer: Self-pay | Admitting: Physician Assistant

## 2016-07-31 ENCOUNTER — Ambulatory Visit (INDEPENDENT_AMBULATORY_CARE_PROVIDER_SITE_OTHER): Payer: Medicare Other | Admitting: Physician Assistant

## 2016-07-31 VITALS — BP 130/70 | HR 78 | Ht 63.0 in | Wt 170.0 lb

## 2016-07-31 DIAGNOSIS — I10 Essential (primary) hypertension: Secondary | ICD-10-CM | POA: Diagnosis not present

## 2016-07-31 DIAGNOSIS — I2510 Atherosclerotic heart disease of native coronary artery without angina pectoris: Secondary | ICD-10-CM | POA: Diagnosis not present

## 2016-07-31 DIAGNOSIS — E785 Hyperlipidemia, unspecified: Secondary | ICD-10-CM

## 2016-07-31 DIAGNOSIS — I5022 Chronic systolic (congestive) heart failure: Secondary | ICD-10-CM

## 2016-07-31 DIAGNOSIS — I255 Ischemic cardiomyopathy: Secondary | ICD-10-CM

## 2016-07-31 LAB — BASIC METABOLIC PANEL WITH GFR
BUN: 20 mg/dL (ref 7–25)
CO2: 22 mmol/L (ref 20–31)
Calcium: 9.9 mg/dL (ref 8.6–10.4)
Chloride: 103 mmol/L (ref 98–110)
Creat: 1.18 mg/dL — ABNORMAL HIGH (ref 0.50–0.99)
Glucose, Bld: 91 mg/dL (ref 65–99)
Potassium: 4 mmol/L (ref 3.5–5.3)
Sodium: 140 mmol/L (ref 135–146)

## 2016-07-31 MED ORDER — CARVEDILOL 6.25 MG PO TABS: 9.3750 mg | ORAL_TABLET | Freq: Two times a day (BID) | ORAL | 11 refills | Status: DC

## 2016-07-31 NOTE — Patient Instructions (Addendum)
Medication Instructions:  1. INCREASE COREG TO 9.375 MG TWICE DAILY (THIS WILL BE 1 AND 1/2 TABLETS TWICE DAILY)  Labwork: 1. TODAY BMET, BNP  Testing/Procedures: NONE  Follow-Up: 08/21/16 @ 1:45 WITH SCOTT WEAVER, PAC   Any Other Special Instructions Will Be Listed Below (If Applicable).  If you need a refill on your cardiac medications before your next appointment, please call your pharmacy.

## 2016-08-01 LAB — BRAIN NATRIURETIC PEPTIDE: Brain Natriuretic Peptide: 303.6 pg/mL — ABNORMAL HIGH (ref <100)

## 2016-08-03 ENCOUNTER — Telehealth: Payer: Self-pay | Admitting: *Deleted

## 2016-08-03 DIAGNOSIS — I10 Essential (primary) hypertension: Secondary | ICD-10-CM

## 2016-08-03 DIAGNOSIS — I5020 Unspecified systolic (congestive) heart failure: Secondary | ICD-10-CM

## 2016-08-03 MED ORDER — SPIRONOLACTONE 25 MG PO TABS: 12.5000 mg | ORAL_TABLET | Freq: Every day | ORAL | 11 refills | Status: DC

## 2016-08-03 NOTE — Telephone Encounter (Signed)
Pt notified of lab results by phone with verbal understanding. Pt is agreeable to start Spironolactone 12.5 mg daily, bmet 10/6 and 10/13. Pt verbalized understanding with read back. Rx Spironolactone sent in today.

## 2016-08-03 NOTE — Telephone Encounter (Signed)
-----   Message from Liliane Shi, [place] sent at 08/01/2016  4:18 PM EDT ----- K+ ok Creatinine stable BNP just mildly elevated. Add Spironolactone 12.5 mg QD If she is taking any K+ supplement, stop taking BMET 5 days and 12 days after 1st dose of [employer], PA-C   08/01/2016 4:17 PM

## 2016-08-07 ENCOUNTER — Encounter: Payer: Self-pay | Admitting: Physician Assistant

## 2016-08-10 ENCOUNTER — Other Ambulatory Visit: Payer: Medicare Other

## 2016-08-13 ENCOUNTER — Other Ambulatory Visit (INDEPENDENT_AMBULATORY_CARE_PROVIDER_SITE_OTHER): Payer: Medicare Other

## 2016-08-13 DIAGNOSIS — I10 Essential (primary) hypertension: Secondary | ICD-10-CM | POA: Diagnosis not present

## 2016-08-13 DIAGNOSIS — I5020 Unspecified systolic (congestive) heart failure: Secondary | ICD-10-CM

## 2016-08-13 LAB — BASIC METABOLIC PANEL
BUN: 25 mg/dL (ref 7–25)
CHLORIDE: 108 mmol/L (ref 98–110)
CO2: 22 mmol/L (ref 20–31)
CREATININE: 1.05 mg/dL — AB (ref 0.50–0.99)
Calcium: 9.8 mg/dL (ref 8.6–10.4)
Glucose, Bld: 86 mg/dL (ref 65–99)
POTASSIUM: 4.1 mmol/L (ref 3.5–5.3)
SODIUM: 141 mmol/L (ref 135–146)

## 2016-08-15 ENCOUNTER — Ambulatory Visit: Payer: Medicare Other | Admitting: Psychiatry

## 2016-08-17 ENCOUNTER — Other Ambulatory Visit: Payer: Medicare Other

## 2016-08-21 ENCOUNTER — Ambulatory Visit: Payer: Medicare Other | Admitting: Physician Assistant

## 2016-08-21 ENCOUNTER — Other Ambulatory Visit (INDEPENDENT_AMBULATORY_CARE_PROVIDER_SITE_OTHER): Payer: Medicare Other

## 2016-08-21 DIAGNOSIS — I10 Essential (primary) hypertension: Secondary | ICD-10-CM

## 2016-08-21 DIAGNOSIS — I5020 Unspecified systolic (congestive) heart failure: Secondary | ICD-10-CM | POA: Diagnosis not present

## 2016-08-22 ENCOUNTER — Telehealth: Payer: Self-pay | Admitting: *Deleted

## 2016-08-22 LAB — BASIC METABOLIC PANEL
BUN: 18 mg/dL (ref 7–25)
CHLORIDE: 105 mmol/L (ref 98–110)
CO2: 23 mmol/L (ref 20–31)
Calcium: 9.9 mg/dL (ref 8.6–10.4)
Creat: 0.95 mg/dL (ref 0.50–0.99)
Glucose, Bld: 115 mg/dL — ABNORMAL HIGH (ref 65–99)
Potassium: 4.3 mmol/L (ref 3.5–5.3)
SODIUM: 139 mmol/L (ref 135–146)

## 2016-08-22 NOTE — Telephone Encounter (Signed)
LVM with family member to have ptcb to go over lab results

## 2016-08-22 NOTE — Telephone Encounter (Signed)
Pt has been notified of lab results by phone with verbal understanding. Pt confirmed her appt on 08/28/16 with Nicki Reaper W. PA.

## 2016-08-28 ENCOUNTER — Encounter: Payer: Self-pay | Admitting: Physician Assistant

## 2016-08-28 ENCOUNTER — Ambulatory Visit (INDEPENDENT_AMBULATORY_CARE_PROVIDER_SITE_OTHER): Payer: Medicare Other | Admitting: Physician Assistant

## 2016-08-28 VITALS — BP 124/62 | HR 79 | Ht 63.0 in | Wt 171.8 lb

## 2016-08-28 DIAGNOSIS — I10 Essential (primary) hypertension: Secondary | ICD-10-CM | POA: Diagnosis not present

## 2016-08-28 DIAGNOSIS — E7800 Pure hypercholesterolemia, unspecified: Secondary | ICD-10-CM

## 2016-08-28 DIAGNOSIS — I5022 Chronic systolic (congestive) heart failure: Secondary | ICD-10-CM | POA: Diagnosis not present

## 2016-08-28 DIAGNOSIS — I255 Ischemic cardiomyopathy: Secondary | ICD-10-CM

## 2016-08-28 DIAGNOSIS — I2510 Atherosclerotic heart disease of native coronary artery without angina pectoris: Secondary | ICD-10-CM

## 2016-08-28 MED ORDER — CARVEDILOL 12.5 MG PO TABS: 12.5000 mg | ORAL_TABLET | Freq: Two times a day (BID) | ORAL | 3 refills | Status: DC

## 2016-08-28 NOTE — Patient Instructions (Addendum)
Medication Instructions:  INCREASE Coreg (Carvedilol) to 12.5 mg Twice daily - you can take 2 of the 6.25 mg to equal 12.5 mg. A prescription for 12.5 mg tablets was sent to your pharmacy.   Labwork: None  Testing/Procedures: None   Follow-Up: Richardson Dopp, PA-C in 1 month. Dr. Sherren Mocha in 3 months.   Any Other Special Instructions Will Be Listed Below (If Applicable). Weigh daily. Call if your weight increases 3 lbs in 1 day.  If you need a refill on your cardiac medications before your next appointment, please call your pharmacy.

## 2016-08-28 NOTE — Progress Notes (Signed)
Cardiology Office Note:    Date:  08/28/2016   ID:  Melanie Ray, DOB [birth year], MRN [id number]  PCP:  Melanie Spikes, DO  Cardiologist:  Dr. Sherren Mocha   Electrophysiologist:  Dr. Thompson Grayer   Referring MD: Melanie Spikes, DO   Chief Complaint  Patient presents with  . Follow-up    CHF    History of Present Illness:    Melanie Ray is a 65 y.o. female with a hx of CAD s/p anterior STEMI in 5/17 tx with DES x 2 to LAD.  EF was 30-35% by Echo and she was DC on LifeVest.  However, she was no longer interested in wearing it at FU.  Echo in 8/17 demonstrated an EF 25-30%.  She was set up to see Dr. Thompson Grayer for EP evaluation.  BiV-ICD implantation was recommended but the patient declined.  I last saw her in 9/17. She returns for FU. She still notes dyspnea at times. Overall she is NYHA 2-2b.  She denies orthopnea, PND, edema. She denies chest pain or syncope. She denies coughing or wheezing.    Prior CV studies that were reviewed today include:    Echo 06/08/16 Mild focal basal septal hypertrophy, EF 25-30%, diff HK, ant-septal AK, Gr 1 DD, mild AI, MAC, mild MR, PASP 37 mmHg   Echo 03/18/16 EF 30-35%, ant-septal AK, Gr 1 DD, mild MR, severe LAE.  LHC 03/17/16 LAD proximal 80%, mid 80%, distal 50%, ostial D1 60%   LCx with LPDA lesion, 30%  RCA Mild calcification with no significant stenosis in a medium caliber, nondominant RCA LVEF is estimated at 45% with inferoapical and lateral wall akinesis PCI: PCI: 3.5 x 24 mm Promus DES to prox LAD, 2.5 x 12 mm Promus DES to mid LAD.  1. Acute anterolateral ST segment elevation MI with successful stenting of the proximal and mid LAD 2. Widely patent, dominant left circumflex 3. Patent, nondominant RCA 4. Proximal LAD dissection treated successfully with primary stenting 5. Moderate segmental LV systolic dysfunction with LVEF estimated at 45%  Past Medical History:  Diagnosis Date  . Anxiety   . Depression   . Diabetes  mellitus type 2 in obese ([place])   . Glaucoma   . Hyperlipemia   . Hypertension   . Left bundle branch block   . ST elevation myocardial infarction (STEMI) of anterior wall ([place]) 03/17/2016    Past Surgical History:  Procedure Laterality Date  . APPENDECTOMY    . CARDIAC CATHETERIZATION N/A 03/17/2016   Procedure: Left Heart Cath and Coronary Angiography;  Surgeon: Sherren Mocha, MD;  Location: [facility];  Service: Cardiovascular;  Laterality: N/A;  . CARDIAC CATHETERIZATION N/A 03/17/2016   Procedure: Coronary Stent Intervention;  Surgeon: Sherren Mocha, MD;  Location: [facility];  Service: Cardiovascular;  Laterality: N/A;  . SMALL BOWEL REPAIR    . TONSILLECTOMY    . UTERINE FIBROID SURGERY      Current Medications: Current Meds  Medication Sig  . aspirin EC 81 MG EC tablet Take 1 tablet (81 mg total) by mouth daily.  [name] atorvastatin (LIPITOR) 80 MG tablet Take 1 tablet (80 mg total) by mouth daily.  . dorzolamide-timolol (COSOPT) 22.3-6.8 MG/ML ophthalmic solution Place 1 drop into both eyes 2 (two) times daily.   . furosemide (LASIX) 40 MG tablet Take 1 tablet (40 mg total) by mouth daily.  . isosorbide mononitrate (IMDUR) 30 MG 24 hr tablet Take 1 tablet (30 mg total)  by mouth daily.  [name] LORazepam (ATIVAN) 0.5 MG tablet Take 1 tablet (0.5 mg total) by mouth at bedtime.  [name] losartan (COZAAR) 50 MG tablet Take 50 mg by mouth daily.  . metFORMIN (GLUCOPHAGE) 500 MG tablet Take 1 tablet (500 mg total) by mouth 2 (two) times daily with a meal.  . nitroGLYCERIN (NITROSTAT) 0.4 MG SL tablet Place 1 tablet (0.4 mg total) under the tongue every 5 (five) minutes x 3 doses as needed for chest pain.  [name] spironolactone (ALDACTONE) 25 MG tablet Take 0.5 tablets (12.5 mg total) by mouth daily.  . ticagrelor (BRILINTA) 90 MG TABS tablet Take 1 tablet (90 mg total) by mouth 2 (two) times daily.  [name] venlafaxine XR (EFFEXOR-XR) 150 MG 24 hr capsule Take 1 capsule (150 mg total) by mouth  daily.  . [DISCONTINUED] carvedilol (COREG) 6.25 MG tablet Take 1.5 tablets (9.375 mg total) by mouth 2 (two) times daily with a meal.     Allergies:   Meperidine and Tetracycline   Social History   Social History  . Marital status: Married    Spouse name: N/A  . Number of children: N/A  . Years of education: N/A   Social History Main Topics  . Smoking status: Former Smoker    Types: Cigarettes    Quit date: 04/13/1997  . Smokeless tobacco: Never Used  . Alcohol use 0.6 oz/week    1 Glasses of wine per week     Comment: 2-3 glasses of wine per month  . Drug use: No  . Sexual activity: No   Other Topics Concern  . None   Social History Narrative   Lives in [place] with spouse.  No children.   Retired first [employer] for over 30 years ([employer] for 10 years and then in [employer] for over 20 years).        Family History:  The patient's family history includes Anxiety disorder in her maternal aunt and mother; Dementia in her mother; Drug abuse in her cousin; Heart failure in her mother; Hypertension in her father and mother; Mood Disorder in her sister; Paranoid behavior in her mother; Stroke in her mother and sister; Tuberculosis in her paternal grandfather.   ROS:   Please see the history of present illness.    Review of Systems  Constitution: Positive for diaphoresis and malaise/fatigue.  Cardiovascular: Positive for dyspnea on exertion.  Hematologic/Lymphatic: Bruises/bleeds easily.  Neurological: Positive for dizziness.   All other systems reviewed and are negative.   EKGs/Labs/Other Test Reviewed:    EKG:  EKG is  ordered today.  The ekg ordered today demonstrates NSR, HR 79, LBBB  Recent Labs: 03/18/2016: TSH 0.589 03/20/2016: Hemoglobin 11.1; Platelets 262 07/31/2016: Brain Natriuretic Peptide 303.6 08/21/2016: BUN 18; Creat 0.95; Potassium 4.3; Sodium 139   Recent Lipid Panel    Component Value Date/Time   CHOL 144 04/24/2016 1630   TRIG 117.0  04/24/2016 1630   HDL 34.20 (L) 04/24/2016 1630   CHOLHDL 4 04/24/2016 1630   VLDL 23.4 04/24/2016 1630   LDLCALC 86 04/24/2016 1630     Physical Exam:    VS:  BP 124/62   Pulse 79   Ht 5\' 3"  (1.6 m)   Wt 171 lb 12.8 oz (77.9 kg)   SpO2 96%   BMI 30.43 kg/m     Wt Readings from Last 3 Encounters:  08/28/16 171 lb 12.8 oz (77.9 kg)  07/31/16 170 lb (77.1 kg)  07/16/16 171 lb 9.6 oz (  77.8 kg)     Physical Exam  Constitutional: She is oriented to person, place, and time. She appears well-developed and well-nourished. No distress.  HENT:  Head: Normocephalic and atraumatic.  Eyes: No scleral icterus.  Neck: No JVD present.  Cardiovascular: Normal rate, regular rhythm and normal heart sounds.   No murmur heard. Pulmonary/Chest: Effort normal. She has no wheezes. She has no rales.  Abdominal: Soft. There is no tenderness.  Musculoskeletal: She exhibits no edema.  Neurological: She is alert and oriented to person, place, and time.  Skin: Skin is warm and dry.  Psychiatric: She has a normal mood and affect.    ASSESSMENT:    1. Chronic systolic heart failure ([place])   2. Ischemic cardiomyopathy   3. Coronary artery disease involving native coronary artery of native heart without angina pectoris   4. Essential hypertension   5. Pure hypercholesterolemia    PLAN:    In order of problems listed above:  1. Chronic systolic HF - NYHA 2.  She is still short of breath at times.  Question if some of her dyspnea is related to Brilinta.  Her BP is now optimal. I am not sure that she can tolerate many more adjustments in her HF regimen. Her HR remains > 70  -  Increase Coreg to 12.5 bid   -  Continue Spiro 12.5 QD, Losartan 50 QD, Imdur 30 QD  -  If BP > 123456 systolic at FU, change Losartan to Entresto  2. Ischemic CM - She has not been interested in ICD implantation.  I asked her to let us know if she changes her mind.  3. CAD - s/p anterior STEMI in 5/17 tx with DES x 2 to  LAD.  No angina.  Continue ASA, Brilinta, statin, beta-blocker, ARB.  We discussed changing Brilinta to Plavix due to her dyspnea.  But, she does not want to change Brilinta.    4. HTN - BP is well controlled now.   5. HL - LDL in 6/17 demonstrated a 50% reduction.  Continue Lipitor 80.     Medication Adjustments/Labs and Tests Ordered: Current medicines are reviewed at length with the patient today.  Concerns regarding medicines are outlined above.  Medication changes, Labs and Tests ordered today are outlined in the Patient Instructions noted below. Patient Instructions  Medication Instructions:  INCREASE Coreg (Carvedilol) to 12.5 mg Twice daily - you can take 2 of the 6.25 mg to equal 12.5 mg. A prescription for 12.5 mg tablets was sent to your pharmacy.   Labwork: None  Testing/Procedures: None   Follow-Up: Richardson Dopp, PA-C in 1 month. Dr. Sherren Mocha in 3 months.   Any Other Special Instructions Will Be Listed Below (If Applicable). Weigh daily. Call if your weight increases 3 lbs in 1 day.  If you need a refill on your cardiac medications before your next appointment, please call your pharmacy.   Signed, Richardson Dopp, PA-C  08/28/2016 3:51 PM    [facility] [address], [place], [place]  [address] Phone: [phone]; Fax: [phone]

## 2016-08-30 ENCOUNTER — Other Ambulatory Visit: Payer: Self-pay | Admitting: Cardiovascular Disease

## 2016-08-30 ENCOUNTER — Other Ambulatory Visit: Payer: Self-pay | Admitting: Family Medicine

## 2016-08-31 ENCOUNTER — Telehealth: Payer: Self-pay | Admitting: *Deleted

## 2016-08-31 NOTE — Telephone Encounter (Signed)
Please check medication on snapshot it was refilled yesterday and sent to [facility].

## 2016-08-31 NOTE — Telephone Encounter (Signed)
seen

## 2016-08-31 NOTE — Telephone Encounter (Signed)
Pt has requested a medication refill of metformin  Pharmacy [address]

## 2016-09-05 ENCOUNTER — Telehealth: Payer: Self-pay | Admitting: Physician Assistant

## 2016-09-05 NOTE — Telephone Encounter (Signed)
New message  [place] calling from Dr. Derryl Harbor office, DDS  Needs to know today if pt needs to be pre-medicated prior to routine visit/cleaning

## 2016-09-05 NOTE — Telephone Encounter (Signed)
Called Melanie Ray with Dr Derryl Harbor office regarding dental cleaning tomorrow. Melanie Ray wanted to know if pt was pre-med or not. After reviewing office notes, I did not see any cardiac procedures which would indicate pt should be pre-med, from a cardiac standpoint. I did see where pt had STEMI and stent placed in 03/17/16. Melanie Ray requested a faxed clearance now because pt has cleaning scheduled for 10:30 AM tomorrow. Informed Dr. Burt Knack is not in the office today, but will be in tomorrow. Will forward to Dr. Burt Knack to advise.   Fax # [phone]

## 2016-09-06 NOTE — Telephone Encounter (Signed)
Telephone encounter faxed to [phone].

## 2016-09-06 NOTE — Telephone Encounter (Signed)
No indication for SBE prophylaxis. thx

## 2016-09-10 ENCOUNTER — Other Ambulatory Visit: Payer: Self-pay | Admitting: Cardiology

## 2016-09-12 DIAGNOSIS — Z23 Encounter for immunization: Secondary | ICD-10-CM | POA: Diagnosis not present

## 2016-09-17 ENCOUNTER — Telehealth: Payer: Self-pay | Admitting: Cardiovascular Disease

## 2016-09-17 ENCOUNTER — Telehealth: Payer: Self-pay | Admitting: *Deleted

## 2016-09-17 MED ORDER — ATORVASTATIN CALCIUM 80 MG PO TABS: 80.0000 mg | ORAL_TABLET | Freq: Every day | ORAL | 3 refills | Status: DC

## 2016-09-17 NOTE — Telephone Encounter (Signed)
rx sent

## 2016-09-17 NOTE — Telephone Encounter (Signed)
New Message   *STAT* If patient is at the pharmacy, call can be transferred to refill team.   1. Which medications need to be refilled? (please list name of each medication and dose if known)  atorvastatin (Lipitor) 80 mg by mouth daily  2. Which pharmacy/location (including street and city if local pharmacy) is medication to be sent to? [facility] [id number], [place], [place], [place]  3. Do they need a 30 day or 90 day supply?  90 day supply

## 2016-09-17 NOTE — Telephone Encounter (Signed)
Pt requested a medication refill atorvastatin  [address]

## 2016-09-17 NOTE — Telephone Encounter (Signed)
Called pt to inform her that her PCP refilled this medication for her and that she needed to call their office to see what is going on with this medication. Pt verbalized understanding.

## 2016-09-20 MED ORDER — ATORVASTATIN CALCIUM 80 MG PO TABS: 80.0000 mg | ORAL_TABLET | Freq: Every day | ORAL | 3 refills | Status: DC

## 2016-09-20 NOTE — Addendum Note (Signed)
Addended by: Carmin Muskrat on: 09/20/2016 03:48 PM   Modules accepted: Orders

## 2016-09-21 ENCOUNTER — Encounter: Payer: Self-pay | Admitting: Physician Assistant

## 2016-10-02 ENCOUNTER — Encounter: Payer: Self-pay | Admitting: Physician Assistant

## 2016-10-02 ENCOUNTER — Encounter (INDEPENDENT_AMBULATORY_CARE_PROVIDER_SITE_OTHER): Payer: Self-pay

## 2016-10-02 ENCOUNTER — Ambulatory Visit (INDEPENDENT_AMBULATORY_CARE_PROVIDER_SITE_OTHER): Payer: Medicare Other | Admitting: Physician Assistant

## 2016-10-02 VITALS — BP 130/68 | HR 83 | Ht 63.0 in | Wt 173.8 lb

## 2016-10-02 DIAGNOSIS — I255 Ischemic cardiomyopathy: Secondary | ICD-10-CM

## 2016-10-02 DIAGNOSIS — I5022 Chronic systolic (congestive) heart failure: Secondary | ICD-10-CM

## 2016-10-02 DIAGNOSIS — E7800 Pure hypercholesterolemia, unspecified: Secondary | ICD-10-CM

## 2016-10-02 DIAGNOSIS — I2510 Atherosclerotic heart disease of native coronary artery without angina pectoris: Secondary | ICD-10-CM | POA: Diagnosis not present

## 2016-10-02 DIAGNOSIS — I10 Essential (primary) hypertension: Secondary | ICD-10-CM | POA: Diagnosis not present

## 2016-10-02 HISTORY — DX: Ischemic cardiomyopathy: I25.5

## 2016-10-02 MED ORDER — SACUBITRIL-VALSARTAN 24-26 MG PO TABS: 1.0000 | ORAL_TABLET | Freq: Two times a day (BID) | ORAL | 3 refills | Status: DC

## 2016-10-02 NOTE — Patient Instructions (Addendum)
Medication Instructions:  1. STOP Losartan 2. START tomorrow on 11/02/16 Entresto 24/26 mg Twice daily   Labwork: When you return in 2 weeks, we will get a BMET. (10/16/16)  Testing/Procedures: None   Follow-Up: Richardson Dopp, PA-C on 10/16/16 @ 1:45  Any Other Special Instructions Will Be Listed Below (If Applicable). Weigh daily. If your weight increases by 3 lbs in 1 day or you feel more short of breath or your legs are swollen, take an extra Lasix (Furosemide) 40 mg that day.  If you need a refill on your cardiac medications before your next appointment, please call your pharmacy.

## 2016-10-02 NOTE — Progress Notes (Signed)
Cardiology Office Note:    Date:  10/02/2016   ID:  Melanie Ray, DOB 01/16/1951, MRN [id number]  PCP:  Coral Spikes, DO  Cardiologist:  Dr. Sherren Mocha   Electrophysiologist:  Dr. Thompson Grayer   Referring MD: Coral Spikes, DO   Chief Complaint  Patient presents with  . Follow-up    CHF    History of Present Illness:    Melanie Ray is a 65 y.o. female with a hx of CAD s/p anterior STEMI in 5/17 tx with DES x 2 to LAD. EF was 30-35% by Echo and she was DC on LifeVest. However, she was no longer interested in wearing it at FU.  Echo in 8/17 demonstrated an EF 25-30%. She was set up to see Dr. Rodell Perna EP evaluation. BiV-ICD implantation was recommended but the patient declined.    She returns for Cardiology follow up.  She is here alone.  She did have increased shortness of breath in the setting of excess salt intake. She adjusted her diet and feels much better.  She denies chest pain, orthopnea, PND, edema.  She notes shortness of breath with moderate to extreme activities.   Prior CV studies that were reviewed today include:    Echo 06/08/16 Mild focal basal septal hypertrophy, EF 25-30%, diff HK, ant-septal AK, Gr 1 DD, mild AI, MAC, mild MR, PASP 37 mmHg  Echo 03/18/16 EF 30-35%, ant-septal AK, Gr 1 DD, mild MR, severe LAE.  LHC 03/17/16 LAD proximal 80%, mid 80%, distal 50%, ostial D1 60%  LCx with LPDA lesion, 30%  RCA Mild calcification with no significant stenosis in a medium caliber, nondominant RCA LVEF is estimated at 45% with inferoapical and lateral wall akinesis PCI: PCI: 3.5 x 24 mm Promus DES to prox LAD, 2.5 x 12 mm Promus DES to mid LAD.  1. Acute anterolateral ST segment elevation MI with successful stenting of the proximal and mid LAD 2. Widely patent, dominant left circumflex 3. Patent, nondominant RCA 4. Proximal LAD dissection treated successfully with primary stenting 5. Moderate segmental LV systolic dysfunction with LVEF  estimated at 45%   Past Medical History:  Diagnosis Date  . Anxiety   . Depression   . Diabetes mellitus type 2 in obese ([place])   . Glaucoma   . Hyperlipemia   . Hypertension   . Left bundle branch block   . ST elevation myocardial infarction (STEMI) of anterior wall ([place]) 03/17/2016    Past Surgical History:  Procedure Laterality Date  . APPENDECTOMY    . CARDIAC CATHETERIZATION N/A 03/17/2016   Procedure: Left Heart Cath and Coronary Angiography;  Surgeon: Sherren Mocha, MD;  Location: [facility];  Service: Cardiovascular;  Laterality: N/A;  . CARDIAC CATHETERIZATION N/A 03/17/2016   Procedure: Coronary Stent Intervention;  Surgeon: Sherren Mocha, MD;  Location: [facility];  Service: Cardiovascular;  Laterality: N/A;  . SMALL BOWEL REPAIR    . TONSILLECTOMY    . UTERINE FIBROID SURGERY      Current Medications: Current Meds  Medication Sig  . aspirin EC 81 MG EC tablet Take 1 tablet (81 mg total) by mouth daily.  [name] atorvastatin (LIPITOR) 80 MG tablet Take 1 tablet (80 mg total) by mouth daily.  . carvedilol (COREG) 12.5 MG tablet Take 1 tablet (12.5 mg total) by mouth 2 (two) times daily.  . dorzolamide-timolol (COSOPT) 22.3-6.8 MG/ML ophthalmic solution Place 1 drop into both eyes 2 (two) times daily.   . furosemide (  LASIX) 40 MG tablet TAKE ONE TABLET BY MOUTH ONCE DAILY  . isosorbide mononitrate (IMDUR) 30 MG 24 hr tablet Take 1 tablet (30 mg total) by mouth daily.  [name] LORazepam (ATIVAN) 0.5 MG tablet Take 1 tablet (0.5 mg total) by mouth at bedtime.  . metFORMIN (GLUCOPHAGE) 500 MG tablet TAKE ONE TABLET BY MOUTH TWICE DAILY WITH MEALS  . nitroGLYCERIN (NITROSTAT) 0.4 MG SL tablet Place 1 tablet (0.4 mg total) under the tongue every 5 (five) minutes x 3 doses as needed for chest pain.  [name] spironolactone (ALDACTONE) 25 MG tablet Take 0.5 tablets (12.5 mg total) by mouth daily.  . ticagrelor (BRILINTA) 90 MG TABS tablet Take 1 tablet (90 mg total) by mouth 2  (two) times daily.  [name] venlafaxine XR (EFFEXOR-XR) 150 MG 24 hr capsule Take 1 capsule (150 mg total) by mouth daily.  . [DISCONTINUED] losartan (COZAAR) 50 MG tablet Take 50 mg by mouth daily.  . [DISCONTINUED] losartan (COZAAR) 50 MG tablet TAKE ONE TABLET BY MOUTH ONCE DAILY     Allergies:   Meperidine and Tetracycline   Social History   Social History  . Marital status: Married    Spouse name: N/A  . Number of children: N/A  . Years of education: N/A   Social History Main Topics  . Smoking status: Former Smoker    Types: Cigarettes    Quit date: 04/13/1997  . Smokeless tobacco: Never Used  . Alcohol use 0.6 oz/week    1 Glasses of wine per week     Comment: 2-3 glasses of wine per month  . Drug use: No  . Sexual activity: No   Other Topics Concern  . None   Social History Narrative   Lives in [place] with spouse.  No children.   Retired first [employer] for over 30 years ([place] for 10 years and then in [place] for over 20 years).        Family History:  The patient's family history includes Anxiety disorder in her maternal aunt and mother; Dementia in her mother; Drug abuse in her cousin; Heart failure in her mother; Hypertension in her father and mother; Mood Disorder in her sister; Paranoid behavior in her mother; Stroke in her mother and sister; Tuberculosis in her paternal grandfather.   ROS:   Please see the history of present illness.    Review of Systems  Cardiovascular: Positive for dyspnea on exertion.  Respiratory: Positive for snoring.   Hematologic/Lymphatic: Bruises/bleeds easily.  Neurological: Positive for dizziness.   All other systems reviewed and are negative.   EKGs/Labs/Other Test Reviewed:    EKG:  EKG is  ordered today.  The ekg ordered today demonstrates NSR, HR 83, LBBB  Recent Labs: 03/18/2016: TSH 0.589 03/20/2016: Hemoglobin 11.1; Platelets 262 07/31/2016: Brain Natriuretic Peptide 303.6 08/21/2016: BUN 18; Creat 0.95;  Potassium 4.3; Sodium 139   Recent Lipid Panel    Component Value Date/Time   CHOL 144 04/24/2016 1630   TRIG 117.0 04/24/2016 1630   HDL 34.20 (L) 04/24/2016 1630   CHOLHDL 4 04/24/2016 1630   VLDL 23.4 04/24/2016 1630   LDLCALC 86 04/24/2016 1630     Physical Exam:    VS:  BP 130/68   Pulse 83   Ht 5\' 3"  (1.6 m)   Wt 173 lb 12.8 oz (78.8 kg)   BMI 30.79 kg/m     Wt Readings from Last 3 Encounters:  10/02/16 173 lb 12.8 oz (78.8 kg)  08/28/16 171  lb 12.8 oz (77.9 kg)  07/31/16 170 lb (77.1 kg)     Physical Exam  Constitutional: She is oriented to person, place, and time. She appears well-developed and well-nourished. No distress.  HENT:  Head: Normocephalic and atraumatic.  Eyes: No scleral icterus.  Neck: No JVD present.  Cardiovascular: Normal rate, regular rhythm and normal heart sounds.   No murmur heard. Pulmonary/Chest: Effort normal. She has no wheezes. She has no rales.  Abdominal: Soft. There is no tenderness.  Musculoskeletal: She exhibits no edema.  Neurological: She is alert and oriented to person, place, and time.  Skin: Skin is warm and dry.  Psychiatric: She has a normal mood and affect.    ASSESSMENT:    1. Chronic systolic CHF (congestive heart failure) ([place])   2. Ischemic cardiomyopathy   3. Coronary artery disease involving native coronary artery of native heart without angina pectoris   4. Essential hypertension   5. Pure hypercholesterolemia    PLAN:    In order of problems listed above:  1. Chronic systolic HF - NYHA [id number]. Continue Coreg to 12.5 bid, Spiro 12.5 QD, Imdur 30 QD.    -  DC Losartan  -  Start Entresto 24/26 mg bid   -  FU 2 weeks with BMET.  2. Ischemic CM - She has not been interested in ICD implantation.   3. CAD - s/p anterior STEMI in 5/17 tx with DES x 2 to LAD. No angina. Continue ASA, Brilinta, statin, beta-blocker, ARB.      4. HTN - BP is controlled.    5. HL - LDL in 6/17 demonstrated a 50%  reduction. Continue Lipitor 80.    Medication Adjustments/Labs and Tests Ordered: Current medicines are reviewed at length with the patient today.  Concerns regarding medicines are outlined above.  Medication changes, Labs and Tests ordered today are outlined in the Patient Instructions noted below. Patient Instructions  Medication Instructions:  1. STOP Losartan 2. START tomorrow on 11/02/16 Entresto 24/26 mg Twice daily   Labwork: When you return in 2 weeks, we will get a BMET. (10/16/16)  Testing/Procedures: None   Follow-Up: Richardson Dopp, PA-C on 10/16/16 @ 1:45  Any Other Special Instructions Will Be Listed Below (If Applicable). Weigh daily. If your weight increases by 3 lbs in 1 day or you feel more short of breath or your legs are swollen, take an extra Lasix (Furosemide) 40 mg that day.  If you need a refill on your cardiac medications before your next appointment, please call your pharmacy.   Signed, Richardson Dopp, PA-C  10/02/2016 4:08 PM    [facility] [place], [place], [place]  [address] Phone: [phone]; Fax: [phone]

## 2016-10-04 ENCOUNTER — Telehealth: Payer: Self-pay

## 2016-10-04 NOTE — Telephone Encounter (Signed)
Prior auth for Entresto 24-26 submitted to [employer]. 

## 2016-10-16 ENCOUNTER — Ambulatory Visit (INDEPENDENT_AMBULATORY_CARE_PROVIDER_SITE_OTHER): Payer: Medicare Other | Admitting: Physician Assistant

## 2016-10-16 ENCOUNTER — Encounter: Payer: Self-pay | Admitting: Physician Assistant

## 2016-10-16 VITALS — BP 122/60 | HR 78 | Ht 63.0 in | Wt 168.0 lb

## 2016-10-16 DIAGNOSIS — I2510 Atherosclerotic heart disease of native coronary artery without angina pectoris: Secondary | ICD-10-CM | POA: Diagnosis not present

## 2016-10-16 DIAGNOSIS — I5022 Chronic systolic (congestive) heart failure: Secondary | ICD-10-CM | POA: Diagnosis not present

## 2016-10-16 DIAGNOSIS — E7800 Pure hypercholesterolemia, unspecified: Secondary | ICD-10-CM | POA: Diagnosis not present

## 2016-10-16 DIAGNOSIS — I255 Ischemic cardiomyopathy: Secondary | ICD-10-CM

## 2016-10-16 DIAGNOSIS — I10 Essential (primary) hypertension: Secondary | ICD-10-CM | POA: Diagnosis not present

## 2016-10-16 NOTE — Patient Instructions (Addendum)
Medication Instructions:  Your physician recommends that you continue on your current medications as directed. Please refer to the Current Medication list given to you today.  Labwork: TODAY BMET  Testing/Procedures: NONE  Follow-Up: KEEP YOUR APPT WITH DR. Burt Knack 11/28/16 @ 3:15  Any Other Special Instructions Will Be Listed Below (If Applicable).  If you need a refill on your cardiac medications before your next appointment, please call your pharmacy.

## 2016-10-16 NOTE — Progress Notes (Signed)
Cardiology Office Note:    Date:  10/16/2016   ID:  Melanie Ray, DOB [birth year], MRN [id number]  PCP:  Coral Spikes, DO  Cardiologist:  Dr. Sherren Mocha   Electrophysiologist:  Dr. Thompson Grayer   Referring MD: Coral Spikes, DO   Chief Complaint  Patient presents with  . Follow-up    CHF    History of Present Illness:    Melanie Ray is a 65 y.o. female with a hx of CAD s/p anterior STEMI in 5/17 tx with DES x 2 to LAD. EF was 30-35% by Echo and she was DC on LifeVest. However, she was no longer interested in wearing it at FU. Echo in 8/17 demonstrated an EF 25-30%. She was set up to see Dr. Rodell Perna EP evaluation. BiV-ICD implantation was recommended but the patient declined.   Last seen by me 10/02/16.  I changed her ARB to [facility]. She returns for follow-up. She is doing well. She denies significant dyspnea. She denies orthopnea, PND or edema. She denies chest pain. She denies syncope or near syncope.  Prior CV studies that were reviewed today include:    Echo 06/08/16 Mild focal basal septal hypertrophy, EF 25-30%, diff HK, ant-septal AK, Gr 1 DD, mild AI, MAC, mild MR, PASP 37 mmHg  Echo 03/18/16 EF 30-35%, ant-septal AK, Gr 1 DD, mild MR, severe LAE.  LHC 03/17/16 LAD proximal 80%, mid 80%, distal 50%, ostial D1 60%  LCx with LPDA lesion, 30%  RCA Mild calcification with no significant stenosis in a medium caliber, nondominant RCA LVEF is estimated at 45% with inferoapical and lateral wall akinesis PCI: PCI: 3.5 x 24 mm Promus DES to prox LAD, 2.5 x 12 mm Promus DES to mid LAD.   Past Medical History:  Diagnosis Date  . Anxiety   . Depression   . Diabetes mellitus type 2 in obese ([place])   . Glaucoma   . Hyperlipemia   . Hypertension   . Left bundle branch block   . ST elevation myocardial infarction (STEMI) of anterior wall ([place]) 03/17/2016    Past Surgical History:  Procedure Laterality Date  . APPENDECTOMY    . CARDIAC  CATHETERIZATION N/A 03/17/2016   Procedure: Left Heart Cath and Coronary Angiography;  Surgeon: Sherren Mocha, MD;  Location: [facility];  Service: Cardiovascular;  Laterality: N/A;  . CARDIAC CATHETERIZATION N/A 03/17/2016   Procedure: Coronary Stent Intervention;  Surgeon: Sherren Mocha, MD;  Location: [facility];  Service: Cardiovascular;  Laterality: N/A;  . SMALL BOWEL REPAIR    . TONSILLECTOMY    . UTERINE FIBROID SURGERY      Current Medications: Current Meds  Medication Sig  . aspirin EC 81 MG EC tablet Take 1 tablet (81 mg total) by mouth daily.  [name] atorvastatin (LIPITOR) 80 MG tablet Take 1 tablet (80 mg total) by mouth daily.  . carvedilol (COREG) 12.5 MG tablet Take 1 tablet (12.5 mg total) by mouth 2 (two) times daily.  . dorzolamide-timolol (COSOPT) 22.3-6.8 MG/ML ophthalmic solution Place 1 drop into both eyes 2 (two) times daily.   . furosemide (LASIX) 40 MG tablet TAKE ONE TABLET BY MOUTH ONCE DAILY  . isosorbide mononitrate (IMDUR) 30 MG 24 hr tablet Take 1 tablet (30 mg total) by mouth daily.  [name] LORazepam (ATIVAN) 0.5 MG tablet Take 1 tablet (0.5 mg total) by mouth at bedtime.  . metFORMIN (GLUCOPHAGE) 500 MG tablet TAKE ONE TABLET BY MOUTH TWICE DAILY WITH MEALS  .  nitroGLYCERIN (NITROSTAT) 0.4 MG SL tablet Place 1 tablet (0.4 mg total) under the tongue every 5 (five) minutes x 3 doses as needed for chest pain.  . sacubitril-valsartan (ENTRESTO) 24-26 MG Take 1 tablet by mouth 2 (two) times daily.  [name] spironolactone (ALDACTONE) 25 MG tablet Take 0.5 tablets (12.5 mg total) by mouth daily.  . ticagrelor (BRILINTA) 90 MG TABS tablet Take 1 tablet (90 mg total) by mouth 2 (two) times daily.  [name] venlafaxine XR (EFFEXOR-XR) 150 MG 24 hr capsule Take 1 capsule (150 mg total) by mouth daily.     Allergies:   Meperidine and Tetracycline   Social History   Social History  . Marital status: Married    Spouse name: N/A  . Number of children: N/A  . Years of  education: N/A   Social History Main Topics  . Smoking status: Former Smoker    Types: Cigarettes    Quit date: 04/13/1997  . Smokeless tobacco: Never Used  . Alcohol use 0.6 oz/week    1 Glasses of wine per week     Comment: 2-3 glasses of wine per month  . Drug use: No  . Sexual activity: No   Other Topics Concern  . None   Social History Narrative   Lives in [place] with spouse.  No children.   Retired first [employer] for over 30 years ([place] for 10 years and then in [place] for over 20 years).        Family History:  The patient's family history includes Anxiety disorder in her maternal aunt and mother; Dementia in her mother; Drug abuse in her cousin; Heart failure in her mother; Hypertension in her father and mother; Mood Disorder in her sister; Paranoid behavior in her mother; Stroke in her mother and sister; Tuberculosis in her paternal grandfather.   ROS:   Please see the history of present illness.    Review of Systems  Respiratory: Positive for snoring.   Hematologic/Lymphatic: Bruises/bleeds easily.  Neurological: Positive for dizziness.   All other systems reviewed and are negative.   EKGs/Labs/Other Test Reviewed:    EKG:  EKG is  ordered today.  The ekg ordered today demonstrates NSR, HR 78, LBBB  Recent Labs: 03/18/2016: TSH 0.589 03/20/2016: Hemoglobin 11.1; Platelets 262 07/31/2016: Brain Natriuretic Peptide 303.6 08/21/2016: BUN 18; Creat 0.95; Potassium 4.3; Sodium 139   Recent Lipid Panel    Component Value Date/Time   CHOL 144 04/24/2016 1630   TRIG 117.0 04/24/2016 1630   HDL 34.20 (L) 04/24/2016 1630   CHOLHDL 4 04/24/2016 1630   VLDL 23.4 04/24/2016 1630   LDLCALC 86 04/24/2016 1630     Physical Exam:    VS:  BP 122/60   Pulse 78   Ht 5\' 3"  (1.6 m)   Wt 168 lb (76.2 kg)   BMI 29.76 kg/m     Wt Readings from Last 3 Encounters:  10/16/16 168 lb (76.2 kg)  10/02/16 173 lb 12.8 oz (78.8 kg)  08/28/16 171 lb 12.8 oz (77.9  kg)     Physical Exam  Constitutional: She is oriented to person, place, and time. She appears well-developed and well-nourished. No distress.  HENT:  Head: Normocephalic and atraumatic.  Eyes: No scleral icterus.  Neck: No JVD present.  Cardiovascular: Normal rate, regular rhythm and normal heart sounds.   No murmur heard. Pulmonary/Chest: Effort normal. She has no wheezes. She has no rales.  Abdominal: Soft. There is no tenderness.  Musculoskeletal: She exhibits  no edema.  Neurological: She is alert and oriented to person, place, and time.  Skin: Skin is warm and dry.  Psychiatric: She has a normal mood and affect.    ASSESSMENT:    1. Chronic systolic CHF (congestive heart failure) ([place])   2. Coronary artery disease involving native coronary artery of native heart without angina pectoris   3. Essential hypertension   4. Pure hypercholesterolemia    PLAN:    In order of problems listed above:  1. Chronic systolic HF - 2/2 Ischemic CM.  She had declined ICD in the past.  NYHA 2.  Overall, her symptoms are improved since starting on Entresto. She is currently on an excellent regimen.  -  Continue Coreg to 12.5 bid, Spiro 12.5 QD, Imdur 30 QD, Entresto 24/26 mg bid   -  Ivabradine could be considered in the future.   2. CAD - s/p anterior STEMI in 5/17 tx with DES x 2 to LAD. She denies angina.Continue ASA, Brilinta, statin, beta-blocker, ARB.    3. HTN -  Her blood pressure is controlled.  4. HL - LDL in 6/17 demonstrated a 50% reduction. Continue Lipitor 80.    Medication Adjustments/Labs and Tests Ordered: Current medicines are reviewed at length with the patient today.  Concerns regarding medicines are outlined above.  Medication changes, Labs and Tests ordered today are outlined in the Patient Instructions noted below. Patient Instructions  Medication Instructions:  Your physician recommends that you continue on your current medications as directed. Please  refer to the Current Medication list given to you today.  Labwork: TODAY BMET  Testing/Procedures: NONE  Follow-Up: KEEP YOUR APPT WITH DR. Burt Knack 11/28/16 @ 3:15  Any Other Special Instructions Will Be Listed Below (If Applicable).  If you need a refill on your cardiac medications before your next appointment, please call your pharmacy.  Signed, Richardson Dopp, PA-C  10/16/2016 2:11 PM    [facility] [place], [place], [place]  [address] Phone: [phone]; Fax: [phone]

## 2016-10-17 ENCOUNTER — Other Ambulatory Visit: Payer: Self-pay | Admitting: *Deleted

## 2016-10-17 ENCOUNTER — Telehealth: Payer: Self-pay | Admitting: Cardiovascular Disease

## 2016-10-17 LAB — BASIC METABOLIC PANEL
BUN: 27 mg/dL — ABNORMAL HIGH (ref 7–25)
CHLORIDE: 101 mmol/L (ref 98–110)
CO2: 25 mmol/L (ref 20–31)
CREATININE: 1.12 mg/dL — AB (ref 0.50–0.99)
Calcium: 10.3 mg/dL (ref 8.6–10.4)
Glucose, Bld: 87 mg/dL (ref 65–99)
POTASSIUM: 4.5 mmol/L (ref 3.5–5.3)
Sodium: 138 mmol/L (ref 135–146)

## 2016-10-17 MED ORDER — ISOSORBIDE MONONITRATE ER 30 MG PO TB24: 30.0000 mg | ORAL_TABLET | Freq: Every day | ORAL | 11 refills | Status: DC

## 2016-10-17 NOTE — Telephone Encounter (Signed)
Okay to refill this medication under Dr Burt Knack? [facility] has this medication associated with patients pcp. Please advise. Thanks, MI

## 2016-10-17 NOTE — Telephone Encounter (Signed)
Yes okay to refill.  The pt was just seen in our office yesterday.  You can refill for one year.

## 2016-10-17 NOTE — Telephone Encounter (Signed)
I spoke with the pt and made her aware that she is okay to take a bath.

## 2016-10-17 NOTE — Telephone Encounter (Signed)
New Message:   Pt had a heart attack in May, She was told not to take a bath,she wants to know can she bathe now?

## 2016-10-19 ENCOUNTER — Telehealth: Payer: Self-pay | Admitting: *Deleted

## 2016-10-19 DIAGNOSIS — I10 Essential (primary) hypertension: Secondary | ICD-10-CM

## 2016-10-19 DIAGNOSIS — I5022 Chronic systolic (congestive) heart failure: Secondary | ICD-10-CM

## 2016-10-19 MED ORDER — FUROSEMIDE 40 MG PO TABS: 40.0000 mg | ORAL_TABLET | ORAL | 3 refills | Status: DC

## 2016-10-19 NOTE — Telephone Encounter (Signed)
Pt notified of lab results and recommendations to change lasix to 40 mg every Mon, Wed and Fri's with lasix 20 mg all other days. May take extra lasix 20 mg if weight is up 3 lb's x 1 day or increased sob, edema.[name] BMET 11/09/16, pt will be out of town for the holidays. Pt verbalized understanding to plan of care with verbal read back x 2.

## 2016-11-09 ENCOUNTER — Other Ambulatory Visit: Payer: Medicare Other | Admitting: *Deleted

## 2016-11-09 DIAGNOSIS — I5022 Chronic systolic (congestive) heart failure: Secondary | ICD-10-CM

## 2016-11-09 DIAGNOSIS — I10 Essential (primary) hypertension: Secondary | ICD-10-CM

## 2016-11-09 NOTE — Addendum Note (Signed)
Addended by: Eulis Foster on: 11/09/2016 10:36 AM   Modules accepted: Orders

## 2016-11-12 ENCOUNTER — Telehealth: Payer: Self-pay | Admitting: *Deleted

## 2016-11-12 ENCOUNTER — Other Ambulatory Visit: Payer: Self-pay | Admitting: Psychiatry

## 2016-11-12 NOTE — Telephone Encounter (Signed)
Patient left a msg on the refill vm stating that the brilinta has gone from $4.00 to $300.00. I called the patient and she stated that per [facility] she has to meet her deductible which the patient states that she does not have a deductible. She stated that previously Dr Burt Knack had written a letter requesting an over ride and they reduced her copay to $4. Patient can be reached at [phone]. Thanks, MI

## 2016-11-12 NOTE — Telephone Encounter (Signed)
Not seen since Aug. Please make appt for med refill.

## 2016-11-13 ENCOUNTER — Telehealth: Payer: Self-pay | Admitting: *Deleted

## 2016-11-13 ENCOUNTER — Telehealth: Payer: Self-pay

## 2016-11-13 ENCOUNTER — Telehealth: Payer: Self-pay | Admitting: Cardiovascular Disease

## 2016-11-13 DIAGNOSIS — I5022 Chronic systolic (congestive) heart failure: Secondary | ICD-10-CM

## 2016-11-13 MED ORDER — SACUBITRIL-VALSARTAN 24-26 MG PO TABS: 1.0000 | ORAL_TABLET | Freq: Two times a day (BID) | ORAL | Status: DC

## 2016-11-13 MED ORDER — LOSARTAN POTASSIUM 50 MG PO TABS: 50.0000 mg | ORAL_TABLET | Freq: Every day | ORAL | Status: DC

## 2016-11-13 NOTE — Telephone Encounter (Signed)
New message  Pt call requesting to speak with RN. Pt states she was instructed to give Lauren a call. Please call back to discuss

## 2016-11-13 NOTE — Telephone Encounter (Signed)
I spoke with the pt and she took her lost dose of Brilinta this morning and needs to know what to do in regards to not being able to afford this medication now.  Previously a prior authorization was sent to [facility] and this lowered the cost of medication. The pt would like to get this done again.  I will also try and locate samples for the pt as issue is addressed with insurance.

## 2016-11-13 NOTE — Telephone Encounter (Signed)
New message ° ° ° °Pt verbalized that she is returning call for rn  °

## 2016-11-13 NOTE — Telephone Encounter (Signed)
Per Brynda Rim. PA I called back pt to advised of his recommendation to resume Entresto. Pt advised PA felt pt was doing better on the [facility] and the reminded pt reason why Lasix was changed was due to the lasix was drying out her kidneys too much. Pt states to me she will be willing to try the Entresto 24/26 BID again. Pt will have lab work (bmet) 11/28/16 when she comes in to see Dr. Burt Knack. Pt is agreeable to the plan of care with verbal understanding to instructions.

## 2016-11-13 NOTE — Telephone Encounter (Signed)
left message on doctor line rx for effexor xr 150mg  with no additional refills

## 2016-11-13 NOTE — Telephone Encounter (Signed)
6 bottles of Brilinta placed at the front desk for the pt to pick up today.  Pt aware. I will forward this message to Vaughan Basta to address prior authorization for Brilitna.

## 2016-11-13 NOTE — Telephone Encounter (Signed)
Pt has appt for  11-14-16

## 2016-11-13 NOTE — Telephone Encounter (Signed)
pt called needed refill on effexor pt did not have a up coming appt so she was transfered to lea at front desk to set up appt.

## 2016-11-13 NOTE — Telephone Encounter (Signed)
Pt notified of lab results by phone with verbal understanding. Pt states to when we went over her lab results on 12/12 and her kidney function was slightly higher, she chose upon herself to stop the [facility] because she feels the Delene Loll was the cause of her problem. See lab results from 10/16/16, pt was advised to change lasix to 40 mg every Mon, Wed and Fri's with lasix 20 mg all other days of the week. Pt tells me today that she put herself back on the Losartan 50 mg daily and that she would like for me to please let Brynda Rim. PA know this.

## 2016-11-14 ENCOUNTER — Ambulatory Visit (INDEPENDENT_AMBULATORY_CARE_PROVIDER_SITE_OTHER): Payer: Medicare Other | Admitting: Psychiatry

## 2016-11-14 ENCOUNTER — Encounter: Payer: Self-pay | Admitting: Psychiatry

## 2016-11-14 VITALS — BP 135/74 | HR 79 | Wt 173.8 lb

## 2016-11-14 DIAGNOSIS — F428 Other obsessive-compulsive disorder: Secondary | ICD-10-CM | POA: Diagnosis not present

## 2016-11-14 DIAGNOSIS — F331 Major depressive disorder, recurrent, moderate: Secondary | ICD-10-CM

## 2016-11-14 MED ORDER — LORAZEPAM 0.5 MG PO TABS: 0.5000 mg | ORAL_TABLET | Freq: Every day | ORAL | 0 refills | Status: DC

## 2016-11-14 MED ORDER — TRAZODONE HCL 100 MG PO TABS: 100.0000 mg | ORAL_TABLET | Freq: Every evening | ORAL | 1 refills | Status: DC | PRN

## 2016-11-14 MED ORDER — VENLAFAXINE HCL ER 150 MG PO CP24: 150.0000 mg | ORAL_CAPSULE | Freq: Every day | ORAL | 1 refills | Status: DC

## 2016-11-14 NOTE — Telephone Encounter (Signed)
Tier exception for Brilinta requested from [facility]. Patient advised of this.

## 2016-11-14 NOTE — Progress Notes (Signed)
Patient ID: Melanie Ray, female   DOB: September 20, 1951, 65 y.o.   MRN: [id number] [facility] MD/PA/NP OP Progress Note  11/14/2016 2:42 PM Geniya Wilkens  MRN:  [id number]  Subjective:  Patient is a 66 year old female who presented for the follow-up appointment. She was last seen in August. She reported that she missed her appointment as she was sick. She reported that she has been losing her memory and was getting lost. Her husband was helping her. She reported that she was getting angry and upset and was taking her medications. She just ran out of her Effexor. She was focused on getting a refill of the Effexor. Patient reported that she also takes Ativan on a when necessary basis. Patient reported that she does not sleep well at night and has interrupted sleep. She was also talking in detail about the relationship and was upset about the same. She reported that she is feels guilty as she has started seeing one of her old friends. She reported that her husband has dementia and she has to take care of him. She discussed in detail about new relationship but she feels guilty about the same. She reported that she cannot leave her husband as he has dementia. Patient seems to be talking nonstop during the interview. She appeared calm and alert during the interview. She currently denied having any suicidal ideations or plans. She reported that she has had her labs done in December and everything came back normal. She has diabetes and high blood pressure.     Chief Complaint:  Chief Complaint    Follow-up; Medication Refill     Visit Diagnosis:     ICD-9-CM ICD-10-CM   1. Major depressive disorder, recurrent episode, moderate (HCC) 296.32 F33.1   2. Other obsessive-compulsive disorders 300.3 F42.8     Past Medical History:  Past Medical History:  Diagnosis Date  . Anxiety   . Depression   . Diabetes mellitus type 2 in obese ([place])   . Glaucoma   . Hyperlipemia   . Hypertension   . Left bundle branch  block   . ST elevation myocardial infarction (STEMI) of anterior wall ([place]) 03/17/2016    Past Surgical History:  Procedure Laterality Date  . APPENDECTOMY    . CARDIAC CATHETERIZATION N/A 03/17/2016   Procedure: Left Heart Cath and Coronary Angiography;  Surgeon: Sherren Mocha, MD;  Location: [place] [facility];  Service: Cardiovascular;  Laterality: N/A;  . CARDIAC CATHETERIZATION N/A 03/17/2016   Procedure: Coronary Stent Intervention;  Surgeon: Sherren Mocha, MD;  Location: [place] [facility];  Service: Cardiovascular;  Laterality: N/A;  . SMALL BOWEL REPAIR    . TONSILLECTOMY    . UTERINE FIBROID SURGERY     Family History:  Family History  Problem Relation Age of Onset  . Anxiety disorder Mother   . Paranoid behavior Mother   . Hypertension Mother   . Heart failure Mother   . Stroke Mother   . Dementia Mother   . Hypertension Father   . Mood Disorder Sister   . Stroke Sister   . Anxiety disorder Maternal Aunt   . Drug abuse Cousin   . Tuberculosis Paternal Grandfather    Social History:  Social History   Social History  . Marital status: Married    Spouse name: N/A  . Number of children: N/A  . Years of education: N/A   Social History Main Topics  . Smoking status: Former Smoker    Types: Cigarettes    Quit  date: 04/13/1997  . Smokeless tobacco: Never Used  . Alcohol use 0.6 oz/week    1 Glasses of wine per week     Comment: 2-3 glasses of wine per month  . Drug use: No  . Sexual activity: No   Other Topics Concern  . None   Social History Narrative   Lives in [place] with spouse.  No children.   Retired first [employer] for over 30 years ([employer] for 10 years and then in [employer] for over 20 years).      Additional History:   Trazodone Serzone Xanax Ativan   Assessment:   Musculoskeletal: Strength & Muscle Tone: within normal limits Gait & Station: normal Patient leans: N/A  Psychiatric Specialty Exam: Medication Refill      Review of Systems  Psychiatric/Behavioral: Negative for depression, hallucinations, memory loss, substance abuse and suicidal ideas. The patient is nervous/anxious. The patient does not have insomnia.   All other systems reviewed and are negative.   Blood pressure 135/74, pulse 79, weight 173 lb 12.8 oz (78.8 kg).Body mass index is 30.79 kg/m.  General Appearance: Neat and Well Groomed  Eye Contact:  Good  Speech:  Clear and Coherent and Normal Rate  Volume:  Normal  Mood:  Good  Affect:  Congruent  Thought Process:  Linear and Logical  Orientation:  Full (Time, Place, and Person)  Thought Content:  Negative  Suicidal Thoughts:  No  Homicidal Thoughts:  No  Memory:  Immediate;   Good Recent;   Good Remote;   Good  Judgement:  Good  Insight:  Good  Psychomotor Activity:  Negative  Concentration:  Good  Recall:  Good  Fund of Knowledge: Good  Language: Good  Akathisia:  Negative  Handed:  Right unknown  AIMS (if indicated):  Not done  Assets:  Communication Skills Desire for Improvement Leisure Time  ADL's:  Intact  Cognition: WNL  Sleep:  Good   Is the patient at risk to self?  No. Has the patient been a risk to self in the past 6 months?  No. Has the patient been a risk to self within the distant past?  No. Is the patient a risk to others?  No. Has the patient been a risk to others in the past 6 months?  No. Has the patient been a risk to others within the distant past?  No.  Current Medications: Current Outpatient Prescriptions  Medication Sig Dispense Refill  . aspirin EC 81 MG EC tablet Take 1 tablet (81 mg total) by mouth daily.    [name] atorvastatin (LIPITOR) 80 MG tablet Take 1 tablet (80 mg total) by mouth daily. 90 tablet 3  . carvedilol (COREG) 12.5 MG tablet Take 1 tablet (12.5 mg total) by mouth 2 (two) times daily. 180 tablet 3  . dorzolamide-timolol (COSOPT) 22.3-6.8 MG/ML ophthalmic solution Place 1 drop into both eyes 2 (two) times daily.     .  furosemide (LASIX) 40 MG tablet Take 1 tablet (40 mg total) by mouth as directed. 40 mg on Mon, Wed and Fri's; all other days 20 mg 90 tablet 3  . isosorbide mononitrate (IMDUR) 30 MG 24 hr tablet Take 1 tablet (30 mg total) by mouth daily. 30 tablet 11  . metFORMIN (GLUCOPHAGE) 500 MG tablet TAKE ONE TABLET BY MOUTH TWICE DAILY WITH MEALS 180 tablet 3  . nitroGLYCERIN (NITROSTAT) 0.4 MG SL tablet Place 1 tablet (0.4 mg total) under the tongue every 5 (five) minutes x 3 doses  as needed for chest pain. 25 tablet 2  . sacubitril-valsartan (ENTRESTO) 24-26 MG Take 1 tablet by mouth 2 (two) times daily.    . ticagrelor (BRILINTA) 90 MG TABS tablet Take 1 tablet (90 mg total) by mouth 2 (two) times daily. 60 tablet 11  . venlafaxine XR (EFFEXOR-XR) 150 MG 24 hr capsule Take 1 capsule (150 mg total) by mouth daily. 30 capsule 1  . spironolactone (ALDACTONE) 25 MG tablet Take 0.5 tablets (12.5 mg total) by mouth daily. 45 tablet 11   No current facility-administered medications for this visit.     Medical Decision Making:  Established Problem, Stable/Improving (1)  Treatment Plan Summary:Medication management   Patient will continue on Effexor XR 150 mg in the morning.  Started her on trazodone 100 mg by mouth daily at bedtime when necessary for insomnia. Continue on Ativan 0.5 mg by mouth daily at bedtime when necessary for anxiety Follow-up in a  month or earlier depending on her symptoms   More than 50% of the time spent in psychoeducation, counseling and coordination of care.     This note was generated in part or whole with voice recognition software. Voice regonition is usually quite accurate but there are transcription errors that can and very often do occur. I apologize for any typographical errors that were not detected and corrected.    Rainey Pines, MD 11/14/2016, 2:42 PM

## 2016-11-16 ENCOUNTER — Telehealth: Payer: Self-pay

## 2016-11-16 NOTE — Telephone Encounter (Signed)
Tier exception for Brilinta 90mg  granted per [facility].

## 2016-11-19 ENCOUNTER — Telehealth: Payer: Self-pay

## 2016-11-19 NOTE — Telephone Encounter (Signed)
Tier exception for Brilinta 90mg  approved by [facility]. This is good through 11/04/2017.

## 2016-11-22 ENCOUNTER — Emergency Department
Admission: EM | Admit: 2016-11-22 | Discharge: 2016-11-22 | Disposition: A | Discharge status: Home / Self Care | Payer: Medicare Other | Attending: Emergency Medicine | Admitting: Emergency Medicine

## 2016-11-22 ENCOUNTER — Encounter: Payer: Self-pay | Admitting: Emergency Medicine

## 2016-11-22 DIAGNOSIS — I2510 Atherosclerotic heart disease of native coronary artery without angina pectoris: Secondary | ICD-10-CM | POA: Diagnosis not present

## 2016-11-22 DIAGNOSIS — Z7984 Long term (current) use of oral hypoglycemic drugs: Secondary | ICD-10-CM | POA: Insufficient documentation

## 2016-11-22 DIAGNOSIS — I110 Hypertensive heart disease with heart failure: Secondary | ICD-10-CM | POA: Diagnosis not present

## 2016-11-22 DIAGNOSIS — R112 Nausea with vomiting, unspecified: Secondary | ICD-10-CM | POA: Diagnosis present

## 2016-11-22 DIAGNOSIS — Z79899 Other long term (current) drug therapy: Secondary | ICD-10-CM | POA: Insufficient documentation

## 2016-11-22 DIAGNOSIS — E119 Type 2 diabetes mellitus without complications: Secondary | ICD-10-CM | POA: Diagnosis not present

## 2016-11-22 DIAGNOSIS — Z87891 Personal history of nicotine dependence: Secondary | ICD-10-CM | POA: Diagnosis not present

## 2016-11-22 DIAGNOSIS — K529 Noninfective gastroenteritis and colitis, unspecified: Secondary | ICD-10-CM | POA: Diagnosis not present

## 2016-11-22 DIAGNOSIS — I5022 Chronic systolic (congestive) heart failure: Secondary | ICD-10-CM | POA: Diagnosis not present

## 2016-11-22 DIAGNOSIS — Z7982 Long term (current) use of aspirin: Secondary | ICD-10-CM | POA: Insufficient documentation

## 2016-11-22 LAB — COMPREHENSIVE METABOLIC PANEL
ALBUMIN: 4.4 g/dL (ref 3.5–5.0)
ALT: 19 U/L (ref 14–54)
AST: 28 U/L (ref 15–41)
Alkaline Phosphatase: 113 U/L (ref 38–126)
Anion gap: 10 (ref 5–15)
BUN: 29 mg/dL — AB (ref 6–20)
CHLORIDE: 102 mmol/L (ref 101–111)
CO2: 24 mmol/L (ref 22–32)
Calcium: 9.8 mg/dL (ref 8.9–10.3)
Creatinine, Ser: 1.03 mg/dL — ABNORMAL HIGH (ref 0.44–1.00)
GFR calc Af Amer: >60 mL/min (ref >60)
GFR, EST NON AFRICAN AMERICAN: 55 mL/min — AB (ref >60)
GLUCOSE: 134 mg/dL — AB (ref 65–99)
POTASSIUM: 3.5 mmol/L (ref 3.5–5.1)
Sodium: 136 mmol/L (ref 135–145)
TOTAL PROTEIN: 8.2 g/dL — AB (ref 6.5–8.1)
Total Bilirubin: 1.1 mg/dL (ref 0.3–1.2)

## 2016-11-22 LAB — URINALYSIS, COMPLETE (UACMP) WITH MICROSCOPIC
BACTERIA UA: NONE SEEN
BILIRUBIN URINE: NEGATIVE
Glucose, UA: NEGATIVE mg/dL
HGB URINE DIPSTICK: NEGATIVE
KETONES UR: NEGATIVE mg/dL
LEUKOCYTES UA: NEGATIVE
NITRITE: NEGATIVE
PROTEIN: NEGATIVE mg/dL
SPECIFIC GRAVITY, URINE: 1.019 (ref 1.005–1.030)
pH: 5 (ref 5.0–8.0)

## 2016-11-22 LAB — CBC
HCT: 34.5 % — ABNORMAL LOW (ref 35.0–47.0)
HEMOGLOBIN: 11.7 g/dL — AB (ref 12.0–16.0)
MCH: 27.1 pg (ref 26.0–34.0)
MCHC: 34.1 g/dL (ref 32.0–36.0)
MCV: 79.5 fL — ABNORMAL LOW (ref 80.0–100.0)
Platelets: 328 10*3/uL (ref 150–440)
RBC: 4.33 MIL/uL (ref 3.80–5.20)
RDW: 16.1 % — AB (ref 11.5–14.5)
WBC: 7.7 10*3/uL (ref 3.6–11.0)

## 2016-11-22 LAB — INFLUENZA PANEL BY PCR (TYPE A & B)
INFLAPCR: NEGATIVE
INFLBPCR: NEGATIVE

## 2016-11-22 LAB — LIPASE, BLOOD: LIPASE: 20 U/L (ref 11–51)

## 2016-11-22 MED ORDER — SODIUM CHLORIDE 0.9 % IV SOLN
1000.0000 mL | Freq: Once | INTRAVENOUS | Status: AC
  Administered 2016-11-22: 1000 mL via INTRAVENOUS

## 2016-11-22 MED ORDER — ONDANSETRON HCL 4 MG/2ML IJ SOLN
4.0000 mg | Freq: Once | INTRAMUSCULAR | Status: AC
  Administered 2016-11-22: 4 mg via INTRAVENOUS
  Filled 2016-11-22: qty 2

## 2016-11-22 MED ORDER — ONDANSETRON HCL 4 MG PO TABS: 4.0000 mg | ORAL_TABLET | Freq: Every day | ORAL | 1 refills | Status: DC | PRN

## 2016-11-22 NOTE — ED Triage Notes (Signed)
Pt presents to ED with c/o N/V/D weakness and dizziness. Pt states symptoms started last night, reports 40 episodes of vomiting and "at least 10 times" of diarrhea. Pt is noted to be in NAD, respirations even and unlabored. Pt ambulatory without difficulty at this time.

## 2016-11-22 NOTE — ED Notes (Signed)
Pt c/o n/v/d since last night, chills, body aches. Pt A&Ox4. NAD noted

## 2016-11-22 NOTE — ED Provider Notes (Signed)
[facility] Emergency Department Provider Note   ____________________________________________    I have reviewed the triage vital signs and the nursing notes.   HISTORY  Chief Complaint Nausea; Emesis; and Diarrhea     HPI Melanie Ray is a 66 y.o. female who presents with nausea vomiting and diarrhea. Patient reports symptoms started last night, she reports multiple episodes of vomiting overnight. Several episodes of diarrhea as well. She reports vomiting began first followed by the diarrhea. She denies significant abdominal pain. No fevers chills have body aches. No sick contacts reported   Past Medical History:  Diagnosis Date  . Anxiety   . Depression   . Diabetes mellitus type 2 in obese ([place])   . Glaucoma   . Hyperlipemia   . Hypertension   . Left bundle branch block   . ST elevation myocardial infarction (STEMI) of anterior wall ([place]) 03/17/2016    Patient Active Problem List   Diagnosis Date Noted  . Ischemic cardiomyopathy 10/02/2016  . CAD (coronary artery disease) 04/11/2016  . Chronic systolic CHF (congestive heart failure) ([place]) 03/21/2016  . History of acute anterior wall MI 03/18/2016  . DM type 2 (diabetes mellitus, type 2) ([place]) 08/03/2015  . Essential hypertension 07/28/2015  . HLD (hyperlipidemia) 07/28/2015  . Glaucoma 07/28/2015  . Depression 07/28/2015  . Anxiety 07/28/2015  . Insomnia 07/28/2015  . Obesity (BMI 30.0-34.9) 07/28/2015    Past Surgical History:  Procedure Laterality Date  . APPENDECTOMY    . CARDIAC CATHETERIZATION N/A 03/17/2016   Procedure: Left Heart Cath and Coronary Angiography;  Surgeon: Sherren Mocha, MD;  Location: [place] [facility];  Service: Cardiovascular;  Laterality: N/A;  . CARDIAC CATHETERIZATION N/A 03/17/2016   Procedure: Coronary Stent Intervention;  Surgeon: Sherren Mocha, MD;  Location: [place] [facility];  Service: Cardiovascular;  Laterality: N/A;  . SMALL BOWEL REPAIR     . TONSILLECTOMY    . UTERINE FIBROID SURGERY      Prior to Admission medications   Medication Sig Start Date End Date Taking? Authorizing Provider  aspirin EC 81 MG EC tablet Take 1 tablet (81 mg total) by mouth daily. 03/21/16   Brittainy Erie Noe, PA-C  atorvastatin (LIPITOR) 80 MG tablet Take 1 tablet (80 mg total) by mouth daily. 09/20/16   Coral Spikes, DO  carvedilol (COREG) 12.5 MG tablet Take 1 tablet (12.5 mg total) by mouth 2 (two) times daily. 08/28/16 08/28/17  Liliane Shi, PA-C  dorzolamide-timolol (COSOPT) 22.3-6.8 MG/ML ophthalmic solution Place 1 drop into both eyes 2 (two) times daily.  01/17/15   Historical Provider, MD  furosemide (LASIX) 40 MG tablet Take 1 tablet (40 mg total) by mouth as directed. 40 mg on Mon, Wed and Fri's; all other days 20 mg 10/19/16 01/17/17  Liliane Shi, PA-C  isosorbide mononitrate (IMDUR) 30 MG 24 hr tablet Take 1 tablet (30 mg total) by mouth daily. 10/17/16   Sherren Mocha, MD  LORazepam (ATIVAN) 0.5 MG tablet Take 1 tablet (0.5 mg total) by mouth at bedtime. 11/14/16   Rainey Pines, MD  metFORMIN (GLUCOPHAGE) 500 MG tablet TAKE ONE TABLET BY MOUTH TWICE DAILY WITH MEALS 08/30/16   Coral Spikes, DO  nitroGLYCERIN (NITROSTAT) 0.4 MG SL tablet Place 1 tablet (0.4 mg total) under the tongue every 5 (five) minutes x 3 doses as needed for chest pain. 03/21/16   Brittainy M Simmons, PA-C  ondansetron (ZOFRAN) 4 MG tablet Take 1 tablet (4 mg total)  by mouth daily as needed for nausea or vomiting. 11/22/16   Lavonia Drafts, MD  sacubitril-valsartan (ENTRESTO) 24-26 MG Take 1 tablet by mouth 2 (two) times daily. 11/13/16   Liliane Shi, PA-C  spironolactone (ALDACTONE) 25 MG tablet Take 0.5 tablets (12.5 mg total) by mouth daily. 08/03/16 11/01/16  Liliane Shi, PA-C  ticagrelor (BRILINTA) 90 MG TABS tablet Take 1 tablet (90 mg total) by mouth 2 (two) times daily. 04/19/16   Sherren Mocha, MD  traZODone (DESYREL) 100 MG tablet Take 1 tablet (100 mg  total) by mouth at bedtime as needed for sleep. 11/14/16   Rainey Pines, MD  venlafaxine XR (EFFEXOR-XR) 150 MG 24 hr capsule Take 1 capsule (150 mg total) by mouth daily. 11/14/16   Rainey Pines, MD     Allergies Meperidine and Tetracycline  Family History  Problem Relation Age of Onset  . Anxiety disorder Mother   . Paranoid behavior Mother   . Hypertension Mother   . Heart failure Mother   . Stroke Mother   . Dementia Mother   . Hypertension Father   . Mood Disorder Sister   . Stroke Sister   . Anxiety disorder Maternal Aunt   . Drug abuse Cousin   . Tuberculosis Paternal Grandfather     Social History Social History  Substance Use Topics  . Smoking status: Former Smoker    Types: Cigarettes    Quit date: 04/13/1997  . Smokeless tobacco: Never Used  . Alcohol use 0.6 oz/week    1 Glasses of wine per week     Comment: 2-3 glasses of wine per month    Review of Systems  Constitutional: No fever/chills Eyes: No visual changes.  ENT: No sore throat. Cardiovascular: Denies chest pain. Respiratory: Denies shortness of breath. Gastrointestinal: As above Genitourinary: Negative for dysuria. Musculoskeletal: Negative for back pain. Body aches as above Skin: Negative for rash. Neurological: Negative for headaches or weakness  10-point ROS otherwise negative.  ____________________________________________   PHYSICAL EXAM:  VITAL SIGNS: ED Triage Vitals  Enc Vitals Group     BP 11/22/16 1138 (!) 110/53     Pulse Rate 11/22/16 1138 93     Resp 11/22/16 1138 18     Temp 11/22/16 1138 98.2 F (36.8 C)     Temp Source 11/22/16 1138 Oral     SpO2 11/22/16 1138 97 %     Weight 11/22/16 1139 170 lb (77.1 kg)     Height 11/22/16 1139 5\' 3"  (1.6 m)     Head Circumference --      Peak Flow --      Pain Score 11/22/16 1415 7     Pain Loc --      Pain Edu? --      Excl. in [place]? --     Constitutional: Alert and oriented. No acute distress. Pleasant and interactive Eyes:  Conjunctivae are normal.   Nose: No congestion/rhinnorhea. Mouth/Throat: Mucous membranes are moist.    Cardiovascular: Normal rate, regular rhythm. Grossly normal heart sounds.  Good peripheral circulation. Respiratory: Normal respiratory effort.  No retractions. Lungs CTAB. Gastrointestinal: Soft and nontender. No distention.  No CVA tenderness. Genitourinary: deferred Musculoskeletal: No lower extremity tenderness nor edema.  Warm and well perfused Neurologic:  Normal speech and language. No gross focal neurologic deficits are appreciated.  Skin:  Skin is warm, dry and intact. No rash noted. Psychiatric: Mood and affect are normal. Speech and behavior are normal.  ____________________________________________   LABS (all  labs ordered are listed, but only abnormal results are displayed)  Labs Reviewed  COMPREHENSIVE METABOLIC PANEL - Abnormal; Notable for the following:       Result Value   Glucose, Bld 134 (*)    BUN 29 (*)    Creatinine, Ser 1.03 (*)    Total Protein 8.2 (*)    GFR calc non Af Amer 55 (*)    All other components within normal limits  CBC - Abnormal; Notable for the following:    Hemoglobin 11.7 (*)    HCT 34.5 (*)    MCV 79.5 (*)    RDW 16.1 (*)    All other components within normal limits  URINALYSIS, COMPLETE (UACMP) WITH MICROSCOPIC - Abnormal; Notable for the following:    Color, Urine YELLOW (*)    APPearance CLEAR (*)    Squamous Epithelial / LPF 0-5 (*)    All other components within normal limits  LIPASE, BLOOD  INFLUENZA PANEL BY PCR (TYPE A & B)   ____________________________________________  EKG  ED ECG REPORT I, Lavonia Drafts, the attending physician, personally viewed and interpreted this ECG.  Date: 11/22/2016  Rate: 99 Rhythm: normal sinus rhythm  Intervals: normal ST/T Wave abnormalities: normal Conduction Disturbances: Left bundle branch block, old Narrative Interpretation:  unremarkable  ____________________________________________  RADIOLOGY  None ____________________________________________   PROCEDURES  Procedure(s) performed: No    Critical Care performed:No ____________________________________________   INITIAL IMPRESSION / ASSESSMENT AND PLAN / ED COURSE  Pertinent labs & imaging results that were available during my care of the patient were reviewed by me and considered in my medical decision making (see chart for details).  Patient overall well-appearing, her abdominal exam is benign. Suspect viral gastroenteritis which is quite common in the created this time. We will treat with IV fluids and Zofran.   ----------------------------------------- 4:16 PM on 11/22/2016 -----------------------------------------  Patient reports she feels much better after fluids. Her lab work is reassuring, I will discharge her with by mouth Zofran and instructions to return if worsening symptoms. Patient and her husband agree with this plan   ____________________________________________   FINAL CLINICAL IMPRESSION(S) / ED DIAGNOSES  Final diagnoses:  Gastroenteritis      NEW MEDICATIONS STARTED DURING THIS VISIT:  New Prescriptions   ONDANSETRON (ZOFRAN) 4 MG TABLET    Take 1 tablet (4 mg total) by mouth daily as needed for nausea or vomiting.     Note:  This document was prepared using Dragon voice recognition software and may include unintentional dictation errors.    Lavonia Drafts, MD 11/22/16 [phone]

## 2016-11-28 ENCOUNTER — Ambulatory Visit (INDEPENDENT_AMBULATORY_CARE_PROVIDER_SITE_OTHER): Payer: Medicare Other | Admitting: Cardiovascular Disease

## 2016-11-28 ENCOUNTER — Other Ambulatory Visit: Payer: Medicare Other

## 2016-11-28 ENCOUNTER — Encounter: Payer: Self-pay | Admitting: Cardiovascular Disease

## 2016-11-28 VITALS — BP 140/76 | HR 74 | Ht 63.0 in | Wt 171.1 lb

## 2016-11-28 DIAGNOSIS — E7800 Pure hypercholesterolemia, unspecified: Secondary | ICD-10-CM | POA: Diagnosis not present

## 2016-11-28 DIAGNOSIS — I2589 Other forms of chronic ischemic heart disease: Secondary | ICD-10-CM

## 2016-11-28 DIAGNOSIS — I255 Ischemic cardiomyopathy: Secondary | ICD-10-CM

## 2016-11-28 DIAGNOSIS — I5022 Chronic systolic (congestive) heart failure: Secondary | ICD-10-CM

## 2016-11-28 DIAGNOSIS — I10 Essential (primary) hypertension: Secondary | ICD-10-CM | POA: Diagnosis not present

## 2016-11-28 NOTE — Patient Instructions (Signed)
Medication Instructions:  Your physician recommends that you continue on your current medications as directed. Please refer to the Current Medication list given to you today.  Labwork: Your physician recommends that you return for a FASTING LIPID and CMP in 4 MONTHS (1 week prior to appointment with Scott)--nothing to eat or drink after midnight, lab opens at 7:30 AM  Testing/Procedures: No new orders.   Follow-Up: Your physician recommends that you schedule a follow-up appointment in: 4 MONTHS with Richardson Dopp PA-C   Any Other Special Instructions Will Be Listed Below (If Applicable).     If you need a refill on your cardiac medications before your next appointment, please call your pharmacy.

## 2016-11-28 NOTE — Progress Notes (Signed)
Cardiology Office Note Date:  11/28/2016   ID:  Melanie Ray, DOB Mar 26, 1951, MRN [id number]  PCP:  Coral Spikes, DO  Cardiologist:  Sherren Mocha, MD    Chief Complaint  Patient presents with  . Coronary Artery Disease    3 month f/u     History of Present Illness: Melanie Ray is a 66 y.o. female who presents for Follow-up evaluation. The patient has coronary artery disease and she initially presented with an acute anterior wall STEMI in May 2017. She underwent primary PCI of the LAD. She was found to have severe LV systolic dysfunction with an ejection fraction < 35%. The patient has continued on a good medical program. She was evaluated by [name] for consideration of ICD implantation which she has decided not to pursue this. She understands the indication and rationale for primary prevention of sudden cardiac cath. The patient is here alone today. She recently had gastroenteritis but is improving. From a cardiac perspective she reports no change in symptoms. She has shortness of breath with moderate activity. She does have some nocturnal dyspnea but no true orthopnea. She denies edema or heart palpitations. She's had no syncope. She denies chest pain or pressure. She's compliant with her medications, but she was not comfortable taking Entresto and she is back on losartan. Otherwise there have medicine changes from the time of her last visit.   Past Medical History:  Diagnosis Date  . Anxiety   . Depression   . Diabetes mellitus type 2 in obese ([place])   . Glaucoma   . Hyperlipemia   . Hypertension   . Left bundle branch block   . ST elevation myocardial infarction (STEMI) of anterior wall ([place]) 03/17/2016    Past Surgical History:  Procedure Laterality Date  . APPENDECTOMY    . CARDIAC CATHETERIZATION N/A 03/17/2016   Procedure: Left Heart Cath and Coronary Angiography;  Surgeon: Sherren Mocha, MD;  Location: [facility];  Service: Cardiovascular;   Laterality: N/A;  . CARDIAC CATHETERIZATION N/A 03/17/2016   Procedure: Coronary Stent Intervention;  Surgeon: Sherren Mocha, MD;  Location: [facility];  Service: Cardiovascular;  Laterality: N/A;  . SMALL BOWEL REPAIR    . TONSILLECTOMY    . UTERINE FIBROID SURGERY      Current Outpatient Prescriptions  Medication Sig Dispense Refill  . aspirin EC 81 MG EC tablet Take 1 tablet (81 mg total) by mouth daily.    [name] atorvastatin (LIPITOR) 80 MG tablet Take 1 tablet (80 mg total) by mouth daily. 90 tablet 3  . carvedilol (COREG) 12.5 MG tablet Take 1 tablet (12.5 mg total) by mouth 2 (two) times daily. 180 tablet 3  . dorzolamide-timolol (COSOPT) 22.3-6.8 MG/ML ophthalmic solution Place 1 drop into both eyes 2 (two) times daily.     . furosemide (LASIX) 40 MG tablet Take 1 tablet (40 mg total) by mouth as directed. 40 mg on Mon, Wed and Fri's; all other days 20 mg 90 tablet 3  . isosorbide mononitrate (IMDUR) 30 MG 24 hr tablet Take 1 tablet (30 mg total) by mouth daily. 30 tablet 11  . LORazepam (ATIVAN) 0.5 MG tablet Take 1 tablet (0.5 mg total) by mouth at bedtime. 30 tablet 0  . losartan (COZAAR) 50 MG tablet Take 50 mg by mouth daily.    . metFORMIN (GLUCOPHAGE) 500 MG tablet TAKE ONE TABLET BY MOUTH TWICE DAILY WITH MEALS 180 tablet 3  . nitroGLYCERIN (NITROSTAT) 0.4 MG SL tablet  Place 1 tablet (0.4 mg total) under the tongue every 5 (five) minutes x 3 doses as needed for chest pain. 25 tablet 2  . ondansetron (ZOFRAN) 4 MG tablet Take 1 tablet (4 mg total) by mouth daily as needed for nausea or vomiting. 20 tablet 1  . ticagrelor (BRILINTA) 90 MG TABS tablet Take 1 tablet (90 mg total) by mouth 2 (two) times daily. 60 tablet 11  . traZODone (DESYREL) 100 MG tablet Take 1 tablet (100 mg total) by mouth at bedtime as needed for sleep. 30 tablet 1  . venlafaxine XR (EFFEXOR-XR) 150 MG 24 hr capsule Take 1 capsule (150 mg total) by mouth daily. 30 capsule 1  . spironolactone (ALDACTONE)  25 MG tablet Take 0.5 tablets (12.5 mg total) by mouth daily. 45 tablet 11   No current facility-administered medications for this visit.     Allergies:   Meperidine and Tetracycline   Social History:  The patient  reports that she quit smoking about 19 years ago. Her smoking use included Cigarettes. She has never used smokeless tobacco. She reports that she drinks about 0.6 oz of alcohol per week . She reports that she does not use drugs.   Family History:  The patient's  family history includes Anxiety disorder in her maternal aunt and mother; Dementia in her mother; Drug abuse in her cousin; Heart failure in her mother; Hypertension in her father and mother; Mood Disorder in her sister; Paranoid behavior in her mother; Stroke in her mother and sister; Tuberculosis in her paternal grandfather.    ROS:  Please see the history of present illness.  Otherwise, review of systems is positive for Shortness of breath with exertion, dizziness, easy bruising, snoring.  All other systems are reviewed and negative.    PHYSICAL EXAM: VS:  BP 140/76   Pulse 74   Ht '5\' 3"'$  (1.6 m)   Wt 171 lb 1.9 oz (77.6 kg)   BMI 30.31 kg/m  , BMI Body mass index is 30.31 kg/m. GEN: Well nourished, well developed, in no acute distress  HEENT: normal  Neck: no JVD, no masses. No carotid bruits Cardiac: RRR without murmur or gallop                Respiratory:  clear to auscultation bilaterally, normal work of breathing GI: soft, nontender, nondistended, + BS MS: no deformity or atrophy  Ext: no pretibial edema, pedal pulses 2+= bilaterally Skin: warm and dry, no rash Neuro:  Strength and sensation are intact Psych: euthymic mood, full affect  EKG:  EKG is not ordered today.  Recent Labs: 03/18/2016: TSH 0.589 07/31/2016: Brain Natriuretic Peptide 303.6 11/22/2016: ALT 19; BUN 29; Creatinine, Ser 1.03; Hemoglobin 11.7; Platelets 328; Potassium 3.5; Sodium 136   Lipid Panel     Component Value Date/Time    CHOL 144 04/24/2016 1630   TRIG 117.0 04/24/2016 1630   HDL 34.20 (L) 04/24/2016 1630   CHOLHDL 4 04/24/2016 1630   VLDL 23.4 04/24/2016 1630   LDLCALC 86 04/24/2016 1630      Wt Readings from Last 3 Encounters:  11/28/16 171 lb 1.9 oz (77.6 kg)  11/22/16 170 lb (77.1 kg)  10/16/16 168 lb (76.2 kg)     Cardiac Studies Reviewed: 2D Echo 06-08-2016: Study Conclusions  - Left ventricle: The cavity size was normal. There was mild focal   basal hypertrophy of the septum. Systolic function was severely   reduced. The estimated ejection fraction was in the range of 25%  to 30%. Diffuse hypokinesis. There is akinesis of the   mid-apicalanteroseptal myocardium. Doppler parameters are   consistent with abnormal left ventricular relaxation (grade 1   diastolic dysfunction). - Ventricular septum: Septal motion showed abnormal function and   dyssynergy. - Aortic valve: Trileaflet; mildly thickened, mildly calcified   leaflets. There was mild regurgitation. - Mitral valve: Moderately calcified annulus. Mildly thickened   leaflets . There was mild regurgitation. - Pulmonary arteries: Systolic pressure was mildly increased. PA   peak pressure: 37 mm Hg (S).  Impressions:  - Compared to the prior study, there has been no significant   interval change.  ASSESSMENT AND PLAN: 1.  CAD, native vessel, with old MI: The patient appears stable with no symptoms of angina. She will continue on her current medical program which is reviewed today. I would be inclined to discontinue Brilinta when she returns in 4 months as long as she appears to be stable from a cardiac perspective. She should remain on lifelong aspirin.  2. Chronic systolic heart failure, NYHA functional class II symptoms. She was not comfortable taking Entresto. She was concerned about a small change in her renal function. We'll continue her on a combination of carvedilol, losartan, and spironolactone. Will repeat labs in 4 months.  Consider increasing carvedilol at the time of her return visit. We also discussed her symptoms that could be suggestive of sleep apnea with nocturnal dyspnea and snoring. She declines further evaluation at this time. I offered her a referral if she changes her mind.  3. Hyperlipidemia: The patient is on a high intensity statin drug. Recent LFTs were within normal limits. Will update a cholesterol panel when she returns.  4. Severe ischemic cardiomyopathy: We discussed her evaluation and her decision to decline an ICD. She understands the potential benefit with respect to primary prevention of sudden cardiac death. She is not interested in pursuing this.   Current medicines are reviewed with the patient today.  The patient does not have concerns regarding medicines.  Labs/ tests ordered today include:   Orders Placed This Encounter  Procedures  . Lipid panel  . Comp Met (CMET)    Disposition:   FU 4 months with Richardson Dopp, PA-C. CMET and lipids before the visit.  Deatra James, MD  11/28/2016 6:02 PM    [facility] [address], [place], [place]  [address] Phone: [phone]; Fax: [phone]

## 2016-12-13 ENCOUNTER — Telehealth: Payer: Self-pay | Admitting: Cardiovascular Disease

## 2016-12-13 NOTE — Telephone Encounter (Signed)
New message      *STAT* If patient is at the pharmacy, call can be transferred to refill team.   1. Which medications need to be refilled? (please list name of each medication and dose if known)   venlafaxine XR (EFFEXOR-XR) 150 MG 24 hr capsule Take 1 capsule (150 mg total) by mouth daily.     2. Which pharmacy/location (including street and city if local pharmacy) is medication to be sent to [address] in [place]   3. Do they need a 30 day or 90 day supply?  [place]

## 2016-12-13 NOTE — Telephone Encounter (Signed)
Spoke with patient and made her aware that she should still have a refill left at the pharmacy per epic. She will call the pharmacy to follow up on this. Patient stated that she does not currently have a psychiatrist and she would like to see if Dr Burt Knack would refill this. I made her aware that Dr Burt Knack does not refill non-cardiac meds but that I would send his nurse a message.

## 2016-12-14 DIAGNOSIS — H40003 Preglaucoma, unspecified, bilateral: Secondary | ICD-10-CM | POA: Diagnosis not present

## 2016-12-17 NOTE — Telephone Encounter (Signed)
I contacted the pt and she did follow-up with her pharmacy and she has 2 additional refills on her Effexor.

## 2016-12-21 DIAGNOSIS — H40003 Preglaucoma, unspecified, bilateral: Secondary | ICD-10-CM | POA: Diagnosis not present

## 2016-12-31 ENCOUNTER — Telehealth: Payer: Self-pay | Admitting: Cardiovascular Disease

## 2016-12-31 NOTE — Telephone Encounter (Signed)
I spoke with the pt and she is calling to have the BMP drawn that was due on 11/28/16 (ordered from 11/09/16 BMP). I reviewed Dr Antionette Char office note and he recommended that she have CMP and LIPID rechecked in 4 months (orders are in the system) since the pt had a CMP done while at the hospital 11/22/16 (11/28/16 BMP not needed).  The pt states that she has been dizzy and had nausea and diarrhea since she left the hospital and wanted to get this lab work checked since it was not done on 11/28/16 due to pt having already had multiple puncture sites and bruises from hospital.  The pt is going to see her PCP next week to follow-up on these symptoms and I advised that they can order labs at that time and forward a copy to Dr Burt Knack per pt's request. Pt agreed with plan.

## 2016-12-31 NOTE — Telephone Encounter (Signed)
New Message   Per pt Dr. Burt Knack wanted her to have labs done at her last visit, but she was unable to get them. She wanted to speak with nurse about getting the order sent to [facility] for those labs.Requesting a call back

## 2017-01-01 ENCOUNTER — Telehealth: Payer: Self-pay | Admitting: Family Medicine

## 2017-01-01 ENCOUNTER — Telehealth: Payer: Self-pay | Admitting: *Deleted

## 2017-01-01 NOTE — Telephone Encounter (Signed)
No additional orders. Just needs to be seen.

## 2017-01-01 NOTE — Telephone Encounter (Signed)
Pt has severe dizziness and weakness. Pt was transferred to [facility], she has been scheduled with Dr.Cook on Thursday.

## 2017-01-01 NOTE — Telephone Encounter (Signed)
Patient has dizziness and weakness especially after sitting for a while and then rising, patient has had diarrhea and nausea since she was seen in ED on 11/22/16 and this has continued may go a day or 2 then the diarrhea and nausea comes back. Patient is taking Zofran 4 mg daily since ED visit. Denies taking anything for diarrhea, patient is scheduled with PCP on 01/03/17.  Any additional orders ?

## 2017-01-01 NOTE — Telephone Encounter (Signed)
PLEASE NOTE: All timestamps contained within this report are represented as [place] Standard Time. CONFIDENTIALTY NOTICE: This fax transmission is intended only for the addressee. It contains information that is legally privileged, confidential or otherwise protected from use or disclosure. If you are not the intended recipient, you are strictly prohibited from reviewing, disclosing, copying using or disseminating any of this information or taking any action in reliance on or regarding this information. If you have received this fax in error, please notify us immediately by telephone so that we can arrange for its return to [name]. Phone: [phone], Toll-Free: [phone], Fax: [phone] Page: 1 of 1 Call Id: [id number] [place] Patient Name: Melanie Ray DOB: [birth year] Initial Comment Caller states, she has been sick off and on, seen at ER. Has episodes of diarrhea, nausea, weakness and dizziness. Verified Nurse Assessment Nurse: Marcelline Deist, RN, [place] Date/Time (Eastern Time): 01/01/2017 10:12:48 AM Confirm and document reason for call. If symptomatic, describe symptoms. ---Caller states, she has been sick off and on, seen at ER 6 wks ago for GI infection. Has episodes of diarrhea, nausea, weakness and dizziness. Verified Over the last week, has been very dizzy. Scheduled her an appt. for Thursday. No fever. Saw cardiologist 2 weeks ago. Does the patient have any new or worsening symptoms? ---Yes Will a triage be completed? ---Yes Related visit to physician within the last 2 weeks? ---No Does the PT have any chronic conditions? (i.e. diabetes, asthma, etc.) ---Yes List chronic conditions. ---heart attack hx, on BP & cholesterol rx, diabetes Is this a behavioral health or substance abuse call? ---No Guidelines Guideline Title Affirmed Question Affirmed Notes Dizziness - Lightheadedness  [1] MODERATE dizziness (e.g., interferes with normal activities) AND [2] has NOT been evaluated by physician for this (Exception: dizziness caused by heat exposure, sudden standing, or poor fluid intake) Final Disposition User See Physician within [place], RN, [employer] does not check blood sugar levels or BP at home. She states the other night, her vision became blurry. She has had episodes of dizziness where she felt she was going to faint. Usually drinks oj or eats something. States dizziness has been most prominent symptom this past week. Has an appt. Thurs. am scheduled. Referrals REFERRED TO PCP OFFICE Disagree/Comply: Comply

## 2017-01-02 ENCOUNTER — Ambulatory Visit: Payer: Medicare Other | Admitting: Psychiatry

## 2017-01-03 ENCOUNTER — Ambulatory Visit (INDEPENDENT_AMBULATORY_CARE_PROVIDER_SITE_OTHER): Payer: Medicare Other | Admitting: Family Medicine

## 2017-01-03 ENCOUNTER — Encounter: Payer: Self-pay | Admitting: Family Medicine

## 2017-01-03 VITALS — BP 117/73 | HR 80 | Temp 98.3°F | Wt 171.0 lb

## 2017-01-03 DIAGNOSIS — I255 Ischemic cardiomyopathy: Secondary | ICD-10-CM | POA: Diagnosis not present

## 2017-01-03 DIAGNOSIS — I5022 Chronic systolic (congestive) heart failure: Secondary | ICD-10-CM | POA: Diagnosis not present

## 2017-01-03 DIAGNOSIS — R5383 Other fatigue: Secondary | ICD-10-CM

## 2017-01-03 DIAGNOSIS — R42 Dizziness and giddiness: Secondary | ICD-10-CM | POA: Diagnosis not present

## 2017-01-03 DIAGNOSIS — R110 Nausea: Secondary | ICD-10-CM

## 2017-01-03 DIAGNOSIS — E119 Type 2 diabetes mellitus without complications: Secondary | ICD-10-CM | POA: Diagnosis not present

## 2017-01-03 DIAGNOSIS — E7800 Pure hypercholesterolemia, unspecified: Secondary | ICD-10-CM | POA: Diagnosis not present

## 2017-01-03 LAB — COMPREHENSIVE METABOLIC PANEL
ALT: 12 U/L (ref 0–35)
AST: 13 U/L (ref 0–37)
Albumin: 4.4 g/dL (ref 3.5–5.2)
Alkaline Phosphatase: 110 U/L (ref 39–117)
BUN: 26 mg/dL — AB (ref 6–23)
CO2: 27 mEq/L (ref 19–32)
CREATININE: 1.21 mg/dL — AB (ref 0.40–1.20)
Calcium: 10.3 mg/dL (ref 8.4–10.5)
Chloride: 102 mEq/L (ref 96–112)
GFR: 57.13 mL/min — ABNORMAL LOW (ref >60.00)
GLUCOSE: 107 mg/dL — AB (ref 70–99)
POTASSIUM: 4.2 meq/L (ref 3.5–5.1)
SODIUM: 137 meq/L (ref 135–145)
Total Bilirubin: 0.4 mg/dL (ref 0.2–1.2)
Total Protein: 7.5 g/dL (ref 6.0–8.3)

## 2017-01-03 LAB — LIPID PANEL
Cholesterol: 132 mg/dL (ref 0–200)
HDL: 37 mg/dL — ABNORMAL LOW (ref >39.00)
LDL Cholesterol: 72 mg/dL (ref 0–99)
NONHDL: 94.64
Total CHOL/HDL Ratio: 4
Triglycerides: 113 mg/dL (ref 0.0–149.0)
VLDL: 22.6 mg/dL (ref 0.0–40.0)

## 2017-01-03 LAB — CBC
HCT: 36.9 % (ref 36.0–46.0)
Hemoglobin: 12.2 g/dL (ref 12.0–15.0)
MCHC: 33 g/dL (ref 30.0–36.0)
MCV: 81.7 fl (ref 78.0–100.0)
PLATELETS: 387 10*3/uL (ref 150.0–400.0)
RBC: 4.52 Mil/uL (ref 3.87–5.11)
RDW: 15.2 % (ref 11.5–15.5)
WBC: 6.8 10*3/uL (ref 4.0–10.5)

## 2017-01-03 LAB — HEMOGLOBIN A1C: HEMOGLOBIN A1C: 6.5 % (ref 4.6–6.5)

## 2017-01-03 LAB — TSH: TSH: 1.02 u[IU]/mL (ref 0.35–4.50)

## 2017-01-03 MED ORDER — ONDANSETRON HCL 4 MG PO TABS: 4.0000 mg | ORAL_TABLET | Freq: Every day | ORAL | 1 refills | Status: DC | PRN

## 2017-01-03 NOTE — Assessment & Plan Note (Signed)
Labs today. Continue Zofran.

## 2017-01-03 NOTE — Patient Instructions (Signed)
Labs today.  We will call with the results. Zofran for nausea. We will arrange echo.  Follow up in 2 weeks.  Take care  Dr. Lacinda Axon

## 2017-01-03 NOTE — Progress Notes (Signed)
Subjective:  Patient ID: Melanie Ray, female    DOB: 07-12-1951  Age: 66 y.o. MRN: [id number]  CC: Dizziness, weakness, diarrhea  HPI:  66 year old female hypertension,hyperlipidemia, DM 2, ischemic cardiomyopathy/chronic systolic CHF, CAD status post STEMI and PCI x 2 presents with above symptoms.  Patient states that she's been feeling poorly since the middle of January (1/18). On January 18, she was seen in the ED for nausea, vomiting, diarrhea. She was diagnosed with viral gastroenteritis and was treated with IV fluids and Zofran. She was discharged home in stable condition.   Patient presents today with complaints of dizziness, fatigue, and ongoing nausea. She has had continued diarrhea since January 18 but this resolved as of last week. She continues to have frequent nausea requiring regular use of Zofran. She reports that she feels fatigued and dizzy. She describes the dizziness as feeling off balance. She states that she is quite fatigued and dizzy which causes her to "feel disoriented". She states that she has trouble getting her words out due to this. No reports of slurred speech or facial droop. Additionally, she has ongoing shortness of breath, which she's had since she had a STEMI. She endorses compliance with her medications. She does not have an ICD as she refused. No other complaints or concerns at this time.   Social Hx   Social History   Social History  . Marital status: Married    Spouse name: N/A  . Number of children: N/A  . Years of education: N/A   Social History Main Topics  . Smoking status: Former Smoker    Types: Cigarettes    Quit date: 04/13/1997  . Smokeless tobacco: Never Used  . Alcohol use 0.6 oz/week    1 Glasses of wine per week     Comment: 2-3 glasses of wine per month  . Drug use: No  . Sexual activity: No   Other Topics Concern  . None   Social History Narrative   Lives in [place] with spouse.  No children.   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      Review of Systems  Constitutional: Positive for fatigue.  Gastrointestinal: Positive for nausea.  Neurological: Positive for dizziness and weakness.   Objective:  BP 117/73 (BP Location: Left Arm, Cuff Size: Large)   Pulse 80   Temp 98.3 F (36.8 C) (Oral)   Wt 171 lb (77.6 kg)   SpO2 96%   BMI 30.29 kg/m   BP/Weight 01/03/2017 11/28/2016 [id number]  Systolic BP [id number]  Diastolic BP 73 76 57  Wt. (Lbs) 171 171.12 170  BMI 30.29 30.31 30.11  Some encounter information is confidential and restricted. Go to Review Flowsheets activity to see all data.   Physical Exam  Constitutional: She is oriented to person, place, and time. She appears well-developed. No distress.  HENT:  Head: Atraumatic.  Mouth/Throat: Oropharynx is clear and moist.  Eyes: Pupils are equal, round, and reactive to light.  Cardiovascular: Normal rate and regular rhythm.   Pulmonary/Chest: Effort normal and breath sounds normal. She has no wheezes. She has no rales.  Abdominal: Soft. She exhibits no distension. There is no tenderness.  Neurological: She is alert and oriented to person, place, and time.  No apparent focal deficits.  Psychiatric: She has a normal mood and affect.  Vitals reviewed.  Lab Results  Component Value Date   WBC 7.7  11/22/2016   HGB 11.7 (L) 11/22/2016   HCT 34.5 (L) 11/22/2016   PLT 328 11/22/2016   GLUCOSE 134 (H) 11/22/2016   CHOL 144 04/24/2016   TRIG 117.0 04/24/2016   HDL 34.20 (L) 04/24/2016   LDLCALC 86 04/24/2016   ALT 19 11/22/2016   AST 28 11/22/2016   NA 136 11/22/2016   K 3.5 11/22/2016   CL 102 11/22/2016   CREATININE 1.03 (H) 11/22/2016   BUN 29 (H) 11/22/2016   CO2 24 11/22/2016   TSH 0.589 03/18/2016   INR 0.93 03/17/2016   HGBA1C 6.2 (H) 03/18/2016    Assessment & Plan:   Problem List Items Addressed This Visit    Nausea    Labs today. Continue  Zofran.      Relevant Orders   CBC   Comprehensive metabolic panel   HLD (hyperlipidemia)   Relevant Orders   Lipid panel   DM type 2 (diabetes mellitus, type 2) ([place])   Relevant Orders   Hemoglobin A1c   Dizziness - Primary    New problem. In setting of nausea and recent gastroenteritis. Orthostatics negative today. BP low-normal.  Does not appear to be vertiginous. Discuss head imaging and will wait on this at this time. Uncertain etiology/prognosis at this time. Labs today.        Chronic systolic CHF (congestive heart failure) ([place])   Relevant Orders   ECHOCARDIOGRAM COMPLETE    Other Visit Diagnoses    Fatigue, unspecified type       Relevant Orders   TSH      Meds ordered this encounter  Medications  . ondansetron (ZOFRAN) 4 MG tablet    Sig: Take 1 tablet (4 mg total) by mouth daily as needed for nausea or vomiting.    Dispense:  20 tablet    Refill:  1  . ondansetron (ZOFRAN) 4 MG tablet    Sig: Take 1 tablet (4 mg total) by mouth daily as needed for nausea or vomiting.    Dispense:  20 tablet    Refill:  1    Follow-up: Return in about 2 weeks (around 01/17/2017).  [place]

## 2017-01-03 NOTE — Progress Notes (Signed)
Pre visit review using our clinic review tool, if applicable. No additional management support is needed unless otherwise documented below in the visit note. 

## 2017-01-03 NOTE — Assessment & Plan Note (Addendum)
New problem. In setting of nausea and recent gastroenteritis. Orthostatics negative today. BP low-normal.  Does not appear to be vertiginous. Discuss head imaging and will wait on this at this time. Uncertain etiology/prognosis at this time. Labs today.

## 2017-01-07 ENCOUNTER — Telehealth: Payer: Self-pay | Admitting: Family Medicine

## 2017-01-07 NOTE — Telephone Encounter (Signed)
Left pt message asking to call Allison back directly at [phone] to schedule AWV. Thanks! °

## 2017-01-08 ENCOUNTER — Ambulatory Visit: Payer: Medicare Other | Admitting: Family Medicine

## 2017-01-14 ENCOUNTER — Ambulatory Visit: Payer: Medicare Other | Admitting: Psychiatry

## 2017-01-21 ENCOUNTER — Telehealth: Payer: Self-pay | Admitting: Family Medicine

## 2017-01-21 ENCOUNTER — Encounter: Payer: Self-pay | Admitting: Family Medicine

## 2017-01-21 ENCOUNTER — Ambulatory Visit (INDEPENDENT_AMBULATORY_CARE_PROVIDER_SITE_OTHER): Payer: Medicare Other | Admitting: Family Medicine

## 2017-01-21 DIAGNOSIS — R42 Dizziness and giddiness: Secondary | ICD-10-CM | POA: Diagnosis not present

## 2017-01-21 DIAGNOSIS — I255 Ischemic cardiomyopathy: Secondary | ICD-10-CM

## 2017-01-21 NOTE — Telephone Encounter (Signed)
FYI - Pt called and stated that she was no called to confirm appt for today's appt. Pt is coming in but would like [name] to do our best to get her in and out because she has another appt at 3 pm in [place].

## 2017-01-21 NOTE — Progress Notes (Signed)
Pre visit review using our clinic review tool, if applicable. No additional management support is needed unless otherwise documented below in the visit note. 

## 2017-01-21 NOTE — Progress Notes (Signed)
Subjective:  Patient ID: Melanie Ray, female    DOB: Mar 23, 1951  Age: 66 y.o. MRN: [id number]  CC: Follow up Dizziness/nausea  HPI:  66 year old female with an extensive past medical history including DM 2, CAD, CHF/ischemic cardiomyopathy presents for follow-up regarding dizziness and nausea.  Patient has had ongoing dizziness. Has been a chronic issue in the past but recently worsened after a viral GI illness. Her workup regarding her dizziness was unremarkable. She presents today for follow-up regarding this. She states that her dizziness is improved but still occurs daily and is mild. He describes it as feeling unstable. Denies symptoms of vertigo. She had some nausea and vomiting last week but has had none since then. She otherwise feels well. No known exacerbating or relieving factors. No other complaints or concerns at this time.  Social Hx   Social History   Social History  . Marital status: Married    Spouse name: N/A  . Number of children: N/A  . Years of education: N/A   Social History Main Topics  . Smoking status: Former Smoker    Types: Cigarettes    Quit date: 04/13/1997  . Smokeless tobacco: Never Used  . Alcohol use 0.6 oz/week    1 Glasses of wine per week     Comment: 2-3 glasses of wine per month  . Drug use: No  . Sexual activity: No   Other Topics Concern  . None   Social History Narrative   Lives in [place] with spouse.  No children.   Retired first [employer] for over 30 years ([place] for 10 years and then in [place] for over 20 years).      Review of Systems  Gastrointestinal: Positive for nausea.  Neurological: Positive for dizziness.   Objective:  BP 128/72   Pulse 75   Temp 98.7 F (37.1 C) (Oral)   Wt 172 lb 4 oz (78.1 kg)   SpO2 97%   BMI 30.51 kg/m   BP/Weight 01/21/2017 01/03/2017 11/23/4172  Systolic BP 081 448 185  Diastolic BP 72 73 76  Wt. (Lbs) 172.25 171 171.12  BMI 30.51 30.29 30.31  Some encounter  information is confidential and restricted. Go to Review Flowsheets activity to see all data.    Physical Exam  Constitutional: She is oriented to person, place, and time. She appears well-developed. No distress.  Cardiovascular: Normal rate and regular rhythm.   Pulmonary/Chest: Effort normal and breath sounds normal.  Neurological: She is alert and oriented to person, place, and time. She has normal strength. No cranial nerve deficit. She exhibits normal muscle tone. Coordination normal.  Psychiatric: She has a normal mood and affect.  Vitals reviewed.  Lab Results  Component Value Date   WBC 6.8 01/03/2017   HGB 12.2 01/03/2017   HCT 36.9 01/03/2017   PLT 387.0 01/03/2017   GLUCOSE 107 (H) 01/03/2017   CHOL 132 01/03/2017   TRIG 113.0 01/03/2017   HDL 37.00 (L) 01/03/2017   LDLCALC 72 01/03/2017   ALT 12 01/03/2017   AST 13 01/03/2017   NA 137 01/03/2017   K 4.2 01/03/2017   CL 102 01/03/2017   CREATININE 1.21 (H) 01/03/2017   BUN 26 (H) 01/03/2017   CO2 27 01/03/2017   TSH 1.02 01/03/2017   INR 0.93 03/17/2016   HGBA1C 6.5 01/03/2017    Assessment & Plan:   Problem List Items Addressed This Visit    Dizziness    Established problem, improved but still persistent. Advised  supportive care and if persists to let me know. Will then proceed with neuro referral.        Follow-up: Return in about 6 months (around 07/24/2017).  [place]

## 2017-01-21 NOTE — Telephone Encounter (Signed)
Pt still coming in she made a mistake. Her other appt is not until next week.

## 2017-01-21 NOTE — Telephone Encounter (Signed)
Does pt want to reschedule?

## 2017-01-21 NOTE — Assessment & Plan Note (Signed)
Established problem, improved but still persistent. Advised supportive care and if persists to let me know. Will then proceed with neuro referral.

## 2017-01-21 NOTE — Telephone Encounter (Signed)
Disregard last message.  

## 2017-01-21 NOTE — Patient Instructions (Signed)
If it persists let me know.   Follow up in 6 months.  Take care  Dr. Lacinda Axon

## 2017-01-30 ENCOUNTER — Ambulatory Visit (HOSPITAL_COMMUNITY): Payer: Medicare Other | Attending: Cardiovascular Disease

## 2017-01-30 ENCOUNTER — Other Ambulatory Visit: Payer: Self-pay

## 2017-01-30 DIAGNOSIS — I340 Nonrheumatic mitral (valve) insufficiency: Secondary | ICD-10-CM | POA: Diagnosis not present

## 2017-01-30 DIAGNOSIS — I5022 Chronic systolic (congestive) heart failure: Secondary | ICD-10-CM | POA: Insufficient documentation

## 2017-01-30 DIAGNOSIS — I351 Nonrheumatic aortic (valve) insufficiency: Secondary | ICD-10-CM | POA: Insufficient documentation

## 2017-01-30 DIAGNOSIS — I517 Cardiomegaly: Secondary | ICD-10-CM | POA: Diagnosis not present

## 2017-01-30 MED ORDER — PERFLUTREN LIPID MICROSPHERE
1.0000 mL | INTRAVENOUS | Status: AC | PRN
  Administered 2017-01-30: 2 mL via INTRAVENOUS

## 2017-02-14 ENCOUNTER — Telehealth: Payer: Self-pay

## 2017-02-14 DIAGNOSIS — F331 Major depressive disorder, recurrent, moderate: Secondary | ICD-10-CM

## 2017-02-14 NOTE — Telephone Encounter (Signed)
This is a Dr. Gretel Acre patient. pt has appt for 03-11-17 pt was wondering if enough medication would be sent in just to get her to her appt.    Pt has appt for 03-11-17  Last seen on  11-14-16   Needs  effexor

## 2017-02-14 NOTE — Telephone Encounter (Signed)
Disp Refills Start End   venlafaxine XR (EFFEXOR-XR) 150 MG 24 hr capsule 30 capsule 1 11/14/2016    Sig - Route: Take 1 capsule (150 mg total) by mouth daily. - Oral

## 2017-02-14 NOTE — Telephone Encounter (Signed)
Whats the dosage?

## 2017-02-15 MED ORDER — VENLAFAXINE HCL ER 150 MG PO CP24: 150.0000 mg | ORAL_CAPSULE | Freq: Every day | ORAL | 0 refills | Status: DC

## 2017-02-15 NOTE — Telephone Encounter (Signed)
called patient explained that rx was oked for the effexor but not for the ativan because of the dizzyness.  pt agreesed that she would discuss with dr. Gretel Acre at her next appt.

## 2017-02-15 NOTE — Telephone Encounter (Signed)
pt called again checking on refill.  pt was told that no doctor here in the office today that I would send to [facility] to see if Doctor would approve.

## 2017-02-15 NOTE — Telephone Encounter (Addendum)
Met with Dr. Daron Offer who reviewed patient's record and agreed to provide a one time 30 day order for patient's Effexor XR but would not refill Lorazepam at this time.  Dr. Daron Offer provided verbal order for Effexor XR 150 mg, one daily with no refills.  Patient to follow up with Dr. Gretel Acre on 03/11/17 and e-scribed new Effexor XR order as verbally authorized for 150 mg, one by mouth daily, #30 with no refills to patient's [facility] in [place].  Lorazepam order not authorized as patient has had several recent medical appointments for dizziness. To discuss this with Dr. Gretel Acre further at next evaluation.

## 2017-02-18 ENCOUNTER — Telehealth: Payer: Self-pay | Admitting: Family Medicine

## 2017-02-18 DIAGNOSIS — Z1239 Encounter for other screening for malignant neoplasm of breast: Secondary | ICD-10-CM

## 2017-02-18 NOTE — Telephone Encounter (Signed)
Fine by me 

## 2017-02-18 NOTE — Telephone Encounter (Signed)
Patient has 3 quality Metric gaps in care, Mammogram , colonoscopy , DM eye exam, Patient recommendation from health maintenance is every 10 year colonoscopy last in 2001, last Mammogram on file 2001 ok to order? Can patient use cologuard?

## 2017-02-21 NOTE — Telephone Encounter (Signed)
Pt states she will call back to schedule AWV

## 2017-02-25 NOTE — Telephone Encounter (Signed)
Talked with patient she has agreed to cologuard and to Mammogram, if you will sign off on Mammogram I will call and schedule for patient. Patient has reported DM eye exam for this year at [facility] . I have ordered Cologuard pateint refused colonoscopy. Upon completion quality metric Gaps will be closed. Please advise when Mammo order is signed off.

## 2017-02-26 NOTE — Telephone Encounter (Signed)
Ok with mammo and other items.

## 2017-03-04 ENCOUNTER — Other Ambulatory Visit: Payer: Self-pay | Admitting: Family Medicine

## 2017-03-04 ENCOUNTER — Telehealth: Payer: Self-pay | Admitting: Family Medicine

## 2017-03-04 DIAGNOSIS — Z1211 Encounter for screening for malignant neoplasm of colon: Secondary | ICD-10-CM

## 2017-03-04 NOTE — Telephone Encounter (Signed)
Referral placed.

## 2017-03-04 NOTE — Telephone Encounter (Signed)
Pt called about wanting to have a colonoscopy done instead of doing the cologuard. Referral needed please and thank you!  Call pt @ [phone].

## 2017-03-04 NOTE — Telephone Encounter (Signed)
Pt was called and informed of referral. Pt stated that she is still having dizziness and stated that you mentioned a MRI if she was still having the dizziness.

## 2017-03-06 ENCOUNTER — Other Ambulatory Visit: Payer: Self-pay | Admitting: Family Medicine

## 2017-03-06 DIAGNOSIS — R42 Dizziness and giddiness: Secondary | ICD-10-CM

## 2017-03-06 NOTE — Telephone Encounter (Signed)
Pt called and stated that she is okay with referral to neurology.

## 2017-03-06 NOTE — Progress Notes (Signed)
re

## 2017-03-06 NOTE — Telephone Encounter (Signed)
I would recommend she see neurology.

## 2017-03-11 ENCOUNTER — Encounter: Payer: Self-pay | Admitting: Psychiatry

## 2017-03-11 ENCOUNTER — Ambulatory Visit: Payer: Medicare Other | Admitting: Psychiatry

## 2017-03-11 ENCOUNTER — Ambulatory Visit (INDEPENDENT_AMBULATORY_CARE_PROVIDER_SITE_OTHER): Payer: Medicare Other | Admitting: Psychiatry

## 2017-03-11 VITALS — BP 149/77 | HR 80 | Temp 97.5°F | Wt 173.4 lb

## 2017-03-11 DIAGNOSIS — F428 Other obsessive-compulsive disorder: Secondary | ICD-10-CM

## 2017-03-11 DIAGNOSIS — F331 Major depressive disorder, recurrent, moderate: Secondary | ICD-10-CM | POA: Diagnosis not present

## 2017-03-11 DIAGNOSIS — I255 Ischemic cardiomyopathy: Secondary | ICD-10-CM | POA: Diagnosis not present

## 2017-03-11 MED ORDER — TRAZODONE HCL 100 MG PO TABS: 100.0000 mg | ORAL_TABLET | Freq: Every evening | ORAL | 1 refills | Status: DC | PRN

## 2017-03-11 MED ORDER — LORAZEPAM 0.5 MG PO TABS: 0.5000 mg | ORAL_TABLET | Freq: Every day | ORAL | 0 refills | Status: DC

## 2017-03-11 MED ORDER — VENLAFAXINE HCL ER 150 MG PO CP24: 150.0000 mg | ORAL_CAPSULE | Freq: Every day | ORAL | 2 refills | Status: DC

## 2017-03-11 NOTE — Progress Notes (Signed)
Patient ID: Melanie Ray, female   DOB: October 06, 1951, 66 y.o.   MRN: [id number] [facility] MD/PA/NP OP Progress Note  03/11/2017 9:16 AM Melanie Ray  MRN:  [id number]  Subjective:  Patient is a 66 year old female who presented for the follow-up appointment. She was last seen in August. She reported that she continues to feel dizziness and nausea every morning. She reported that she has been working with her primary care physician who has referred her to the neurologist. She is going to have the MRI done. Patient reported that she has elevated blood pressure. Patient reported that she is going to discuss with her neurologist as well as with her primary care physician about her symptoms. Patient appeared apprehensive during the interview. She reported that she also missed 2 doses of her Effexor and they were refilled by her primary care physician. Patient reported that she does not take trazodone and lorazepam on a regular basis and does not get the refills on her medications. She currently denied having any suicidal homicidal ideations or plans. She denied having any perceptual disturbances. We discussed about her medications in detail and she is compliant with them. She reported that she will discuss with the PCP about her medications. She feels that her blood pressure medications are not helping her at this time.       Chief Complaint:  Chief Complaint    Follow-up; Medication Refill     Visit Diagnosis:     ICD-9-CM ICD-10-CM   1. Major depressive disorder, recurrent episode, moderate (HCC) 296.32 F33.1   2. Other obsessive-compulsive disorders 300.3 F42.8     Past Medical History:  Past Medical History:  Diagnosis Date  . Anxiety   . Depression   . Diabetes mellitus type 2 in obese ([place])   . Glaucoma   . Hyperlipemia   . Hypertension   . Left bundle branch block   . ST elevation myocardial infarction (STEMI) of anterior wall ([place]) 03/17/2016    Past Surgical History:  Procedure  Laterality Date  . APPENDECTOMY    . CARDIAC CATHETERIZATION N/A 03/17/2016   Procedure: Left Heart Cath and Coronary Angiography;  Surgeon: Sherren Mocha, MD;  Location: [place] [facility];  Service: Cardiovascular;  Laterality: N/A;  . CARDIAC CATHETERIZATION N/A 03/17/2016   Procedure: Coronary Stent Intervention;  Surgeon: Sherren Mocha, MD;  Location: [place] [facility];  Service: Cardiovascular;  Laterality: N/A;  . SMALL BOWEL REPAIR    . TONSILLECTOMY    . UTERINE FIBROID SURGERY     Family History:  Family History  Problem Relation Age of Onset  . Anxiety disorder Mother   . Paranoid behavior Mother   . Hypertension Mother   . Heart failure Mother   . Stroke Mother   . Dementia Mother   . Hypertension Father   . Mood Disorder Sister   . Stroke Sister   . Anxiety disorder Maternal Aunt   . Drug abuse Cousin   . Tuberculosis Paternal Grandfather    Social History:  Social History   Social History  . Marital status: Married    Spouse name: N/A  . Number of children: N/A  . Years of education: N/A   Social History Main Topics  . Smoking status: Former Smoker    Types: Cigarettes    Quit date: 04/13/1997  . Smokeless tobacco: Never Used  . Alcohol use 0.6 oz/week    1 Glasses of wine per week     Comment: 2-3 glasses of wine per  month  . Drug use: No  . Sexual activity: No   Other Topics Concern  . None   Social History Narrative   Lives in [place] with spouse.  No children.   Retired first [employer] for over 30 years ([place] for 10 years and then in [place] for over 20 years).      Additional History:   Trazodone Serzone Xanax Ativan   Assessment:   Musculoskeletal: Strength & Muscle Tone: within normal limits Gait & Station: normal Patient leans: N/A  Psychiatric Specialty Exam: Medication Refill     Review of Systems  Psychiatric/Behavioral: Negative for depression, hallucinations, memory loss, substance abuse and suicidal  ideas. The patient is nervous/anxious. The patient does not have insomnia.   All other systems reviewed and are negative.   Blood pressure (!) 149/77, pulse 80, temperature 97.5 F (36.4 C), temperature source Oral, weight 173 lb 6.4 oz (78.7 kg).Body mass index is 30.72 kg/m.  General Appearance: Neat and Well Groomed  Eye Contact:  Good  Speech:  Clear and Coherent and Normal Rate  Volume:  Normal  Mood:  Good  Affect:  Congruent  Thought Process:  Linear and Logical  Orientation:  Full (Time, Place, and Person)  Thought Content:  Negative  Suicidal Thoughts:  No  Homicidal Thoughts:  No  Memory:  Immediate;   Good Recent;   Good Remote;   Good  Judgement:  Good  Insight:  Good  Psychomotor Activity:  Negative  Concentration:  Good  Recall:  Good  Fund of Knowledge: Good  Language: Good  Akathisia:  Negative  Handed:  Right unknown  AIMS (if indicated):  Not done  Assets:  Communication Skills Desire for Improvement Leisure Time  ADL's:  Intact  Cognition: WNL  Sleep:  Good   Is the patient at risk to self?  No. Has the patient been a risk to self in the past 6 months?  No. Has the patient been a risk to self within the distant past?  No. Is the patient a risk to others?  No. Has the patient been a risk to others in the past 6 months?  No. Has the patient been a risk to others within the distant past?  No.  Current Medications: Current Outpatient Prescriptions  Medication Sig Dispense Refill  . aspirin EC 81 MG EC tablet Take 1 tablet (81 mg total) by mouth daily.    [name] atorvastatin (LIPITOR) 80 MG tablet Take 1 tablet (80 mg total) by mouth daily. 90 tablet 3  . carvedilol (COREG) 12.5 MG tablet Take 1 tablet (12.5 mg total) by mouth 2 (two) times daily. 180 tablet 3  . dorzolamide-timolol (COSOPT) 22.3-6.8 MG/ML ophthalmic solution Place 1 drop into both eyes 2 (two) times daily.     . isosorbide mononitrate (IMDUR) 30 MG 24 hr tablet Take 1 tablet (30 mg total)  by mouth daily. 30 tablet 11  . LORazepam (ATIVAN) 0.5 MG tablet Take 1 tablet (0.5 mg total) by mouth at bedtime. 30 tablet 0  . losartan (COZAAR) 50 MG tablet Take 50 mg by mouth daily.    . metFORMIN (GLUCOPHAGE) 500 MG tablet TAKE ONE TABLET BY MOUTH TWICE DAILY WITH MEALS 180 tablet 3  . nitroGLYCERIN (NITROSTAT) 0.4 MG SL tablet Place 1 tablet (0.4 mg total) under the tongue every 5 (five) minutes x 3 doses as needed for chest pain. 25 tablet 2  . ticagrelor (BRILINTA) 90 MG TABS tablet Take 1 tablet (90 mg  total) by mouth 2 (two) times daily. 60 tablet 11  . traZODone (DESYREL) 100 MG tablet Take 1 tablet (100 mg total) by mouth at bedtime as needed for sleep. 30 tablet 1  . venlafaxine XR (EFFEXOR-XR) 150 MG 24 hr capsule Take 1 capsule (150 mg total) by mouth daily. 30 capsule 0  . furosemide (LASIX) 40 MG tablet Take 1 tablet (40 mg total) by mouth as directed. 40 mg on Mon, Wed and Fri's; all other days 20 mg 90 tablet 3  . spironolactone (ALDACTONE) 25 MG tablet Take 0.5 tablets (12.5 mg total) by mouth daily. 45 tablet 11   No current facility-administered medications for this visit.     Medical Decision Making:  Established Problem, Stable/Improving (1)  Treatment Plan Summary:Medication management   Patient will continue on Effexor XR 150 mg in the morning.  She does not take lorazepam and trazodone at this time. She has meds  at home and will not be refilled.  Follow-up in 3 months or earlier depending on her symptoms    More than 50% of the time spent in psychoeducation, counseling and coordination of care.     This note was generated in part or whole with voice recognition software. Voice regonition is usually quite accurate but there are transcription errors that can and very often do occur. I apologize for any typographical errors that were not detected and corrected.    Rainey Pines, MD 03/11/2017, 9:16 AM

## 2017-03-15 ENCOUNTER — Telehealth: Payer: Self-pay

## 2017-03-15 ENCOUNTER — Other Ambulatory Visit: Payer: Self-pay

## 2017-03-15 DIAGNOSIS — Z1211 Encounter for screening for malignant neoplasm of colon: Secondary | ICD-10-CM

## 2017-03-15 NOTE — Telephone Encounter (Signed)
Gastroenterology Pre-Procedure Review  Request Date: 04/04/17 Requesting Physician: Dr. Allen Norris  PATIENT REVIEW QUESTIONS: The patient responded to the following health history questions as indicated:    1. Are you having any GI issues? yes (nausea and diarrhea) 2. Do you have a personal history of Polyps? no 3. Do you have a family history of Colon Cancer or Polyps? no 4. Diabetes Mellitus? yes (self) 5. Joint replacements in the past 12 months?no 6. Major health problems in the past 3 months?no 7. Any artificial heart valves, MVP, or defibrillator?no Heart attact last May 2017    MEDICATIONS & ALLERGIES:    Patient reports the following regarding taking any anticoagulation/antiplatelet therapy:   Plavix, Coumadin, Eliquis, Xarelto, Lovenox, Pradaxa, Brilinta, or Effient? no Aspirin? yes (daily aspirin 81 mg )  Patient confirms/reports the following medications:  Current Outpatient Prescriptions  Medication Sig Dispense Refill  . aspirin EC 81 MG EC tablet Take 1 tablet (81 mg total) by mouth daily.    [name] atorvastatin (LIPITOR) 80 MG tablet Take 1 tablet (80 mg total) by mouth daily. 90 tablet 3  . carvedilol (COREG) 12.5 MG tablet Take 1 tablet (12.5 mg total) by mouth 2 (two) times daily. 180 tablet 3  . dorzolamide-timolol (COSOPT) 22.3-6.8 MG/ML ophthalmic solution Place 1 drop into both eyes 2 (two) times daily.     . furosemide (LASIX) 40 MG tablet Take 1 tablet (40 mg total) by mouth as directed. 40 mg on Mon, Wed and Fri's; all other days 20 mg 90 tablet 3  . isosorbide mononitrate (IMDUR) 30 MG 24 hr tablet Take 1 tablet (30 mg total) by mouth daily. 30 tablet 11  . LORazepam (ATIVAN) 0.5 MG tablet Take 1 tablet (0.5 mg total) by mouth at bedtime. Pt has supply 30 tablet 0  . losartan (COZAAR) 50 MG tablet Take 50 mg by mouth daily.    . metFORMIN (GLUCOPHAGE) 500 MG tablet TAKE ONE TABLET BY MOUTH TWICE DAILY WITH MEALS 180 tablet 3  . nitroGLYCERIN (NITROSTAT) 0.4 MG SL tablet  Place 1 tablet (0.4 mg total) under the tongue every 5 (five) minutes x 3 doses as needed for chest pain. 25 tablet 2  . spironolactone (ALDACTONE) 25 MG tablet Take 0.5 tablets (12.5 mg total) by mouth daily. 45 tablet 11  . ticagrelor (BRILINTA) 90 MG TABS tablet Take 1 tablet (90 mg total) by mouth 2 (two) times daily. 60 tablet 11  . traZODone (DESYREL) 100 MG tablet Take 1 tablet (100 mg total) by mouth at bedtime as needed for sleep. Pt has supply 30 tablet 1  . venlafaxine XR (EFFEXOR-XR) 150 MG 24 hr capsule Take 1 capsule (150 mg total) by mouth daily. 30 capsule 2   No current facility-administered medications for this visit.     Patient confirms/reports the following allergies:  Allergies  Allergen Reactions  . Meperidine Nausea And Vomiting  . Tetracycline Swelling    No orders of the defined types were placed in this encounter.   AUTHORIZATION INFORMATION Primary Insurance: 1D#: Group #:  Secondary Insurance: 1D#: Group #:  SCHEDULE INFORMATION: Date: 04/04/17 Time: Location:[facility]

## 2017-03-18 ENCOUNTER — Telehealth: Payer: Self-pay | Admitting: Gastroenterology

## 2017-03-18 ENCOUNTER — Telehealth: Payer: Self-pay | Admitting: Cardiovascular Disease

## 2017-03-18 NOTE — Telephone Encounter (Signed)
I spoke with the pt and she is scheduled to have a lipid and CMP drawn in our office 03/22/17. The pt would like to have this drawn at PCP office.  I will fax an order to [phone].

## 2017-03-18 NOTE — Telephone Encounter (Signed)
New message   Patient wants PCP to draw lab work - will have PCP to fax over result to Dr. Burt Knack    PCP - office  # [phone]- Dr. Lacinda Axon

## 2017-03-18 NOTE — Telephone Encounter (Signed)
03/18/17 Prior Auth NOT required for [facility] for Screening Colonoscopy [phone] / Z12.11.

## 2017-03-21 ENCOUNTER — Other Ambulatory Visit (INDEPENDENT_AMBULATORY_CARE_PROVIDER_SITE_OTHER): Payer: Medicare Other

## 2017-03-21 DIAGNOSIS — I5022 Chronic systolic (congestive) heart failure: Secondary | ICD-10-CM | POA: Diagnosis not present

## 2017-03-21 DIAGNOSIS — I10 Essential (primary) hypertension: Secondary | ICD-10-CM | POA: Diagnosis not present

## 2017-03-21 DIAGNOSIS — E7800 Pure hypercholesterolemia, unspecified: Secondary | ICD-10-CM

## 2017-03-21 DIAGNOSIS — I255 Ischemic cardiomyopathy: Secondary | ICD-10-CM | POA: Diagnosis not present

## 2017-03-22 ENCOUNTER — Other Ambulatory Visit: Payer: Medicare Other

## 2017-03-22 LAB — COMPREHENSIVE METABOLIC PANEL
A/G RATIO: 1.5 (ref 1.2–2.2)
ALBUMIN: 4.3 g/dL (ref 3.6–4.8)
ALK PHOS: 139 IU/L — AB (ref 39–117)
ALT: 19 IU/L (ref 0–32)
AST: 16 IU/L (ref 0–40)
BILIRUBIN TOTAL: 0.2 mg/dL (ref 0.0–1.2)
BUN / CREAT RATIO: 19 (ref 12–28)
BUN: 19 mg/dL (ref 8–27)
CHLORIDE: 101 mmol/L (ref 96–106)
CO2: 19 mmol/L (ref 18–29)
Calcium: 9.8 mg/dL (ref 8.7–10.3)
Creatinine, Ser: 1.02 mg/dL — ABNORMAL HIGH (ref 0.57–1.00)
GFR calc non Af Amer: 57 mL/min/{1.73_m2} — ABNORMAL LOW (ref >59)
GFR, EST AFRICAN AMERICAN: 66 mL/min/{1.73_m2} (ref >59)
GLOBULIN, TOTAL: 2.8 g/dL (ref 1.5–4.5)
Glucose: 163 mg/dL — ABNORMAL HIGH (ref 65–99)
Potassium: 4.2 mmol/L (ref 3.5–5.2)
SODIUM: 140 mmol/L (ref 134–144)
Total Protein: 7.1 g/dL (ref 6.0–8.5)

## 2017-03-22 LAB — LIPID PANEL
CHOLESTEROL TOTAL: 124 mg/dL (ref 100–199)
Chol/HDL Ratio: 3.4 ratio (ref 0.0–4.4)
HDL: 36 mg/dL — AB (ref >39)
LDL Calculated: 65 mg/dL (ref 0–99)
TRIGLYCERIDES: 114 mg/dL (ref 0–149)
VLDL Cholesterol Cal: 23 mg/dL (ref 5–40)

## 2017-03-25 ENCOUNTER — Telehealth: Payer: Self-pay | Admitting: *Deleted

## 2017-03-25 NOTE — Telephone Encounter (Signed)
Patient questioned to know which OTC medication would be appropriate for her to take for  a common cold. Pt has a  heart condition, high blood pressure, cholesterol and diabetes.  Pt contact [phone]

## 2017-03-25 NOTE — Telephone Encounter (Signed)
Flonase, antihistamine.

## 2017-03-25 NOTE — Telephone Encounter (Signed)
Reason for call: common cold.  Symptoms: cough dry , headache comes and goes, nasal congestion runny nose, congestion during the day, achy,  Duration Friday Medications:None  Last seen for this problem: Seen by: Please advise what she can take due to high blood pressure , cholesterol, and diabetes.

## 2017-03-26 NOTE — Telephone Encounter (Signed)
Patient advised of below and verbalized an understanding  

## 2017-03-28 ENCOUNTER — Other Ambulatory Visit: Payer: Medicare Other

## 2017-03-29 ENCOUNTER — Encounter: Payer: Self-pay | Admitting: Physician Assistant

## 2017-03-29 ENCOUNTER — Ambulatory Visit (INDEPENDENT_AMBULATORY_CARE_PROVIDER_SITE_OTHER): Payer: Medicare Other | Admitting: Physician Assistant

## 2017-03-29 VITALS — BP 128/62 | HR 77 | Ht 63.0 in | Wt 175.8 lb

## 2017-03-29 DIAGNOSIS — I2510 Atherosclerotic heart disease of native coronary artery without angina pectoris: Secondary | ICD-10-CM | POA: Diagnosis not present

## 2017-03-29 DIAGNOSIS — I5022 Chronic systolic (congestive) heart failure: Secondary | ICD-10-CM | POA: Diagnosis not present

## 2017-03-29 DIAGNOSIS — I10 Essential (primary) hypertension: Secondary | ICD-10-CM | POA: Diagnosis not present

## 2017-03-29 DIAGNOSIS — E7800 Pure hypercholesterolemia, unspecified: Secondary | ICD-10-CM | POA: Diagnosis not present

## 2017-03-29 DIAGNOSIS — I255 Ischemic cardiomyopathy: Secondary | ICD-10-CM

## 2017-03-29 DIAGNOSIS — R42 Dizziness and giddiness: Secondary | ICD-10-CM

## 2017-03-29 MED ORDER — CARVEDILOL 12.5 MG PO TABS: 12.5000 mg | ORAL_TABLET | ORAL | 3 refills | Status: DC

## 2017-03-29 NOTE — Progress Notes (Signed)
Cardiology Office Note:    Date:  03/29/2017   ID:  Melanie Ray, DOB Apr 04, 1951, MRN [id number]  PCP:  Melanie Spikes, DO  Cardiologist:  Dr. Sherren Ray    Referring MD: Melanie Spikes, DO   Chief Complaint  Patient presents with  . Follow-up    CAD, CHF    History of Present Illness:    Melanie Ray is a 66 y.o. female with a hx of CAD s/p anterior STEMI in 5/17 tx with DES x 2 to LAD. EF was 30-35% by Echo and she was DC on LifeVest. However, she was no longer interested in wearing it at FU. Echo in 8/17 demonstrated an EF 25-30%. She was set up to see Dr. Rodell Ray EP evaluation. BiV-ICD implantation was recommended but the patient declined.  She could not tolerate Entresto and therefore has remained on angiotensin receptor blocker.  Last seen by Dr. Sherren Ray in 11/2016.  He felt that she could DC Brilinta 1 year out from her MI if she was stable.  She has had some symptoms of OSA but has declined sleep testing in the past.   She has had another echocardiogram ordered by her PCP in 01/2017 which continued to demonstrate EF 30-35.    Melanie Ray returns for routine Cardiology follow up. She is here alone.  She notes a long hx of dizziness.  She denies syncope or palpitations.  She has had this for years without significant change. She sometimes gets nauseated.  She denies any change in her symptoms since her myocardial infarction 1 year ago.  Eating sometimes helps her symptoms.  She tells me that she is getting an MRI soon to work up her dizziness.  She denies syncope.  She notes dyspnea on exertion with more moderate activities without significant change.  She denies orthopnea, PND, edema.    Prior CV studies:   The following studies were reviewed today:  Echo 01/30/17 Diff HK, mild focal basal septal hypertrophy, EF 30-35, mild AI, MAC, mild MR  Echo 06/08/16 Mild focal basal septal hypertrophy, EF 25-30%, diff HK, ant-septal AK, Gr 1 DD, mild AI, MAC,  mild MR, PASP 37 mmHg  Echo 03/18/16 EF 30-35%, ant-septal AK, Gr 1 DD, mild MR, severe LAE.  LHC 03/17/16 LAD proximal 80%, mid 80%, distal 50%, ostial D1 60%  LCx with LPDA lesion, 30%  RCA Mild calcification with no significant stenosis in a medium caliber, nondominant RCA LVEF is estimated at 45% with inferoapical and lateral wall akinesis PCI: PCI: 3.5 x 24 mm Promus DES to prox LAD, 2.5 x 12 mm Promus DES to mid LAD.   Past Medical History:  Diagnosis Date  . Anxiety   . CAD (coronary artery disease) 04/11/2016   S/p ant STEMI 5/17: LHC >> LAD proximal 80%, mid 80%, distal 50%, ostial D1 60%; LCx with LPDA lesion 30%; RCA Mild calcification with no significant stenosis in a medium caliber, nondominant RCA; LVEF is estimated at 45% with inferoapical and lateral wall akinesis >> PCI: PCI: 3.5 x 24 mm Promus DES to prox LAD, 2.5 x 12 mm Promus DES to mid LAD.   [name] Chronic systolic CHF (congestive heart failure) ([place]) 03/21/2016   Echo 01/30/17: Diff HK, mild focal basal septal hypertrophy, EF 30-35, mild AI, MAC, mild MR // Echo 06/08/16: Mild focal basal septal hypertrophy, EF 25-30%, diff HK, ant-septal AK, Gr 1 DD, mild AI, MAC, mild MR, PASP 37 mmHg // Echo 03/18/16: EF 30-35%, ant-septal  AK, Gr 1 DD, mild MR, severe LAE.  [name] Depression   . Diabetes mellitus type 2 in obese ([place])   . Glaucoma   . History of acute anterior wall MI 03/17/2016  . Hyperlipemia   . Hypertension   . Ischemic cardiomyopathy 10/02/2016   Refused ICD  . Left bundle branch block     Past Surgical History:  Procedure Laterality Date  . APPENDECTOMY    . CARDIAC CATHETERIZATION N/A 03/17/2016   Procedure: Left Heart Cath and Coronary Angiography;  Surgeon: Melanie Mocha, MD;  Location: [facility];  Service: Cardiovascular;  Laterality: N/A;  . CARDIAC CATHETERIZATION N/A 03/17/2016   Procedure: Coronary Stent Intervention;  Surgeon: Melanie Mocha, MD;  Location: [facility];  Service:  Cardiovascular;  Laterality: N/A;  . SMALL BOWEL REPAIR    . TONSILLECTOMY    . UTERINE FIBROID SURGERY      Current Medications: Current Meds  Medication Sig  . aspirin EC 81 MG EC tablet Take 1 tablet (81 mg total) by mouth daily.  [name] atorvastatin (LIPITOR) 80 MG tablet Take 1 tablet (80 mg total) by mouth daily.  . dorzolamide-timolol (COSOPT) 22.3-6.8 MG/ML ophthalmic solution Place 1 drop into both eyes 2 (two) times daily.   . furosemide (LASIX) 40 MG tablet Take 1 tablet (40 mg total) by mouth as directed. 40 mg on Mon, Wed and Fri's; all other days 20 mg  . isosorbide mononitrate (IMDUR) 30 MG 24 hr tablet Take 1 tablet (30 mg total) by mouth daily.  [name] LORazepam (ATIVAN) 0.5 MG tablet Take 1 tablet (0.5 mg total) by mouth at bedtime. Pt has supply  . losartan (COZAAR) 50 MG tablet Take 50 mg by mouth daily.  . metFORMIN (GLUCOPHAGE) 500 MG tablet TAKE ONE TABLET BY MOUTH TWICE DAILY WITH MEALS  . nitroGLYCERIN (NITROSTAT) 0.4 MG SL tablet Place 1 tablet (0.4 mg total) under the tongue every 5 (five) minutes x 3 doses as needed for chest pain.  [name] spironolactone (ALDACTONE) 25 MG tablet Take 0.5 tablets (12.5 mg total) by mouth daily.  . ticagrelor (BRILINTA) 90 MG TABS tablet Take 1 tablet (90 mg total) by mouth 2 (two) times daily.  . traZODone (DESYREL) 100 MG tablet Take 1 tablet (100 mg total) by mouth at bedtime as needed for sleep. Pt has supply  . venlafaxine XR (EFFEXOR-XR) 150 MG 24 hr capsule Take 1 capsule (150 mg total) by mouth daily.  . [DISCONTINUED] carvedilol (COREG) 12.5 MG tablet Take 1 tablet (12.5 mg total) by mouth 2 (two) times daily.     Allergies:   Meperidine; Tetracycline; and Tetracyclines & related   Social History   Social History  . Marital status: Married    Spouse name: N/A  . Number of children: N/A  . Years of education: N/A   Social History Main Topics  . Smoking status: Former Smoker    Types: Cigarettes    Quit date: 04/13/1997  .  Smokeless tobacco: Never Used  . Alcohol use 0.6 oz/week    1 Glasses of wine per week     Comment: 2-3 glasses of wine per month  . Drug use: No  . Sexual activity: No   Other Topics Concern  . None   Social History Narrative   Lives in [place] with spouse.  No children.   Retired first [employer] for over 30 years ([place] for 10 years and then in [place] for over 20 years).  Family Hx: The patient's family history includes Anxiety disorder in her maternal aunt and mother; Dementia in her mother; Drug abuse in her cousin; Heart failure in her mother; Hypertension in her father and mother; Mood Disorder in her sister; Paranoid behavior in her mother; Stroke in her mother and sister; Tuberculosis in her paternal grandfather.  ROS:   Please see the history of present illness.    ROS All other systems reviewed and are negative.   EKGs/Labs/Other Test Reviewed:    EKG:  EKG is  ordered today.  The ekg ordered today demonstrates NSR, HR 77, LBBB, no changes.   Recent Labs: 07/31/2016: Brain Natriuretic Peptide 303.6 01/03/2017: Hemoglobin 12.2; Platelets 387.0; TSH 1.02 03/21/2017: ALT 19; BUN 19; Creatinine, Ser 1.02; Potassium 4.2; Sodium 140   Recent Lipid Panel    Component Value Date/Time   CHOL 124 03/21/2017 1022   TRIG 114 03/21/2017 1022   HDL 36 (L) 03/21/2017 1022   CHOLHDL 3.4 03/21/2017 1022   CHOLHDL 4 01/03/2017 0947   VLDL 22.6 01/03/2017 0947   LDLCALC 65 03/21/2017 1022     Physical Exam:    VS:  BP 128/62   Pulse 77   Ht _0  (1.6 m)   Wt 175 lb 12.8 oz (79.7 kg)   BMI 31.14 kg/m     Wt Readings from Last 3 Encounters:  03/29/17 175 lb 12.8 oz (79.7 kg)  01/21/17 172 lb 4 oz (78.1 kg)  01/03/17 171 lb (77.6 kg)     Physical Exam  Constitutional: She is oriented to person, place, and time. She appears well-developed and well-nourished. No distress.  HENT:  Head: Normocephalic and atraumatic.  Eyes: No scleral icterus.    Neck: Normal range of motion. No JVD present.  Cardiovascular: Normal rate, regular rhythm, S1 normal, S2 normal and normal heart sounds.   No murmur heard. Pulmonary/Chest: Breath sounds normal. She has no wheezes. She has no rhonchi. She has no rales.  Abdominal: Soft. There is no tenderness.  Musculoskeletal: She exhibits no edema.  Neurological: She is alert and oriented to person, place, and time.  Skin: Skin is warm and dry.  Psychiatric: She has a normal mood and affect.    ASSESSMENT:    1. Coronary artery disease involving native coronary artery of native heart without angina pectoris   2. Chronic systolic CHF (congestive heart failure) ([place])   3. Ischemic cardiomyopathy   4. Essential hypertension   5. Pure hypercholesterolemia   6. Dizziness    PLAN:    In order of problems listed above:  1. Coronary artery disease involving native coronary artery of native heart without angina pectoris -  S/p anterior STEMI in 5/17 tx with DES to LAD x 2.  She is doing well without angina.  She is 1 year out from her myocardial infarction.  She may finish her current Rx for Brilinta then stop.    -  Continue beta-blocker, ASA, statin.  2. Chronic systolic CHF (congestive heart failure) ([facility]) - Volume is stable.  We discussed the rationale for attempting to get her HR < 70.  She is willing to try to increase her beta-blocker.    -  Increase Coreg to 18.75 mg bid  -  Continue angiotensin receptor blocker, aldosterone antagonist, nitrates  -  She did not tolerate Entresto  3. Ischemic cardiomyopathy - She declined ICD implantation in the past.   4. Essential hypertension - BP controlled.   5. Pure hypercholesterolemia - LDL  optimal on most recent lab work.  Continue current Rx.    6. Dizziness - This is a chronic problem.  FU with PCP as planned.   Dispo:  Return in about 6 months (around 09/29/2017) for Routine Follow Up, w/ Dr. Burt Knack.   Medication Adjustments/Labs and  Tests Ordered: Current medicines are reviewed at length with the patient today.  Concerns regarding medicines are outlined above.  Orders/Tests:  Orders Placed This Encounter  Procedures  . EKG 12-Lead   Medication changes: Meds ordered this encounter  Medications  . carvedilol (COREG) 12.5 MG tablet    Sig: Take 1 tablet (12.5 mg total) by mouth as directed. 1 tablet in the AM and 1 and 1/2 tabs in the PM x 1 week; then increase to 1 1/2 tabs twice daily    Dispense:  180 tablet    Refill:  3   Signed, Richardson Dopp, PA-C  03/29/2017 [facility] [place], [place], [place]  [address] Phone: [phone]; Fax: [phone]

## 2017-03-29 NOTE — Patient Instructions (Addendum)
Medication Instructions:  INCREASE COREG TO 12.5 MG IN THE AM AND 18.75 MG (1 AND 1/2 TABS) IN THE PM , DO THIS FOR 1 WEEK THEN INCREASE TO 18.75 MG TWICE DAILY; IF AFTER THE INCREASE TO 18.75 MG TWICE DAILY MAKES YOU FEEL BAD OK PER SCOTT WEAVER, PAC TO GO BACK TO THE 12.5 MG TWICE DAILY   ONCE YOU FINISH YOUR CURRENT BOTTLE OF THE BRILINTA STOP  Labwork: NONE ORERED  Testing/Procedures: NONE ORDERED  Follow-Up: Your physician wants you to follow-up in: 6 MONTHS WITH DR Burt Knack You will receive a reminder letter in the mail two months in advance. If you don't receive a letter, please call our office to schedule the follow-up appointment.   Any Other Special Instructions Will Be Listed Below (If Applicable).     If you need a refill on your cardiac medications before your next appointment, please call your pharmacy.

## 2017-04-03 ENCOUNTER — Ambulatory Visit (INDEPENDENT_AMBULATORY_CARE_PROVIDER_SITE_OTHER): Payer: Medicare Other | Admitting: Neurology

## 2017-04-03 ENCOUNTER — Encounter: Payer: Self-pay | Admitting: Neurology

## 2017-04-03 VITALS — BP 139/76 | HR 93 | Ht 63.0 in | Wt 175.2 lb

## 2017-04-03 DIAGNOSIS — R0681 Apnea, not elsewhere classified: Secondary | ICD-10-CM | POA: Diagnosis not present

## 2017-04-03 DIAGNOSIS — R400 Somnolence: Secondary | ICD-10-CM

## 2017-04-03 DIAGNOSIS — I255 Ischemic cardiomyopathy: Secondary | ICD-10-CM

## 2017-04-03 DIAGNOSIS — H538 Other visual disturbances: Secondary | ICD-10-CM

## 2017-04-03 DIAGNOSIS — R0683 Snoring: Secondary | ICD-10-CM

## 2017-04-03 DIAGNOSIS — R42 Dizziness and giddiness: Secondary | ICD-10-CM

## 2017-04-03 NOTE — Patient Instructions (Signed)
Remember to drink plenty of fluid, eat healthy meals and do not skip any meals. Try to eat protein with a every meal and eat a healthy snack such as fruit or nuts in between meals. Try to keep a regular sleep-wake schedule and try to exercise daily, particularly in the form of walking, 20-30 minutes a day, if you can.   As far as your medications are concerned, I would like to suggest: Zofran as needed  As far as diagnostic testing: MRI brain, Vestibular therapy, Sleep evaluation  I would like to see you back in 3 months, sooner if we need to. Please call us with any interim questions, concerns, problems, updates or refill requests.   Our phone number is [phone]. We also have an after hours call service for urgent matters and there is a physician on-call for urgent questions. For any emergencies you know to call 911 or go to the nearest emergency room

## 2017-04-03 NOTE — Progress Notes (Signed)
[facility]    Provider:  Dr Jaynee Eagles Referring Provider: Coral Spikes, DO Primary Care Physician:  Coral Spikes, DO  CC:  Dizziness  HPI:  Melanie Ray is a 66 y.o. female here as a referral from Dr. Lacinda Axon for dizziness. She has a past medical history of diabetes, ischemic cardiomyopathy and chronic systolic congestive heart failure, coronary artery disease status post STEMI and PCI x2. She is snoring a lot, no headaches at all and no history of migraines. She feels like she doesn' t get enough sleep even if she is bed for a long time, she wakes frequently, she snores heavily. Husband here and provides much information. She is in bed long enough but still feels tired. Her husband notices her stop breathing. He sleeps downstairs and can hear her snoring. In the spring of 2015 she started having chronic dizziness, would last 10-15 minutes and go away and then in the afternoon would occur again 2-3x a week. Candy used to help not anymore, she would try chicken or drink water and maybe it helped but not anymore, water will still help some. It went away for a couple months and then came back. In January she had Gastroenteritis and she was sick with diarrhea and vomiting and since then she has felt dizzy again. Episodic happens every 3-4 days with nausea, no headache, but endorses blurred vision and can;t see the TV well. The episodes last 10-15 minutes. She feels like she is spinning and she has to hold onto something. She almost falls. Loses balance. Unknown triggers. No other focal neurologic deficits, associated symptoms, other inciting events or modifiable factors.  Reviewed notes, labs and imaging from outside physicians, which showed:  Reviewed primary notes. Patient was seen in January in the ED for nausea vomiting and diarrhea. She was diagnosed with viral gastroenteritis was treated with IV fluids and Zofran. She reported dizziness on 01/03/2017, ongoing nausea and fatigue.  She continued to have diarrhea since she had her 18th but that revolved in March. She uses Zofran frequently. She feels fatigued and dizzy. She describes the dizziness as feeling off balance. She feels disoriented. She has trouble getting her words out. No reports of slurred speech or facial droop. She has ongoing shortness of breath since she's had since since her STEMI. Orthostatics were negative. Blood pressure was low normal. Symptoms did not appear to be vertiginous. She was seen again on March 19 by primary care for ongoing dizziness which is chronic but worsened after a viral GI illness. Her workup regarding dizziness was unremarkable. She reported that her dizziness improved but still occurring daily and is mild. She states she feels unstable. Denies vertigo.  CMP 03/21/2017 showed glucose 163, creatinine 1.02, GFR normal African-American, alkaline phosphatase 139 otherwise normal   Review of Systems: Patient complains of symptoms per HPI as well as the following symptoms: Easy bruising, easy bleeding, feeling hot, shortness of breath, snoring, dizziness, insomnia, sleepiness, snoring. Pertinent negatives and positives per HPI. All others negative.   Social History   Social History  . Marital status: Married    Spouse name: N/A  . Number of children: 0  . Years of education: BS in education   Occupational History  . Retired    Social History Main Topics  . Smoking status: Former Smoker    Types: Cigarettes    Quit date: 04/13/1997  . Smokeless tobacco: Never Used  . Alcohol use 0.6 oz/week    1 Glasses of wine per  week     Comment: 2-3 glasses of wine per month  . Drug use: No  . Sexual activity: No   Other Topics Concern  . Not on file   Social History Narrative   Lives in [place] with spouse.  No children.   Retired first [employer] for over 30 years ([place] for 10 years and then in [place] for over 20 years).   Left-handed       Family History  Problem  Relation Age of Onset  . Anxiety disorder Mother   . Paranoid behavior Mother   . Hypertension Mother   . Heart failure Mother   . Stroke Mother   . Dementia Mother   . High Cholesterol Mother   . Hypertension Father   . High Cholesterol Father   . Mood Disorder Sister   . Stroke Sister   . Anxiety disorder Maternal Aunt   . Drug abuse Cousin   . Tuberculosis Paternal Grandfather     Past Medical History:  Diagnosis Date  . Anxiety   . CAD (coronary artery disease) 04/11/2016   S/p ant STEMI 5/17: LHC >> LAD proximal 80%, mid 80%, distal 50%, ostial D1 60%; LCx with LPDA lesion 30%; RCA Mild calcification with no significant stenosis in a medium caliber, nondominant RCA; LVEF is estimated at 45% with inferoapical and lateral wall akinesis >> PCI: PCI: 3.5 x 24 mm Promus DES to prox LAD, 2.5 x 12 mm Promus DES to mid LAD.   [name] Chronic systolic CHF (congestive heart failure) ([place]) 03/21/2016   Echo 01/30/17: Diff HK, mild focal basal septal hypertrophy, EF 30-35, mild AI, MAC, mild MR // Echo 06/08/16: Mild focal basal septal hypertrophy, EF 25-30%, diff HK, ant-septal AK, Gr 1 DD, mild AI, MAC, mild MR, PASP 37 mmHg // Echo 03/18/16: EF 30-35%, ant-septal AK, Gr 1 DD, mild MR, severe LAE.  [name] Depression   . Diabetes mellitus type 2 in obese ([place])   . Glaucoma   . History of acute anterior wall MI 03/17/2016  . Hyperlipemia   . Hypertension   . Ischemic cardiomyopathy 10/02/2016   Refused ICD  . Left bundle branch block     Past Surgical History:  Procedure Laterality Date  . APPENDECTOMY    . CARDIAC CATHETERIZATION N/A 03/17/2016   Procedure: Left Heart Cath and Coronary Angiography;  Surgeon: Sherren Mocha, MD;  Location: [place] [facility];  Service: Cardiovascular;  Laterality: N/A;  . CARDIAC CATHETERIZATION N/A 03/17/2016   Procedure: Coronary Stent Intervention;  Surgeon: Sherren Mocha, MD;  Location: [place] [facility];  Service: Cardiovascular;  Laterality: N/A;  . SMALL  BOWEL REPAIR    . TONSILLECTOMY    . UTERINE FIBROID SURGERY      Current Outpatient Prescriptions  Medication Sig Dispense Refill  . aspirin EC 81 MG EC tablet Take 1 tablet (81 mg total) by mouth daily.    [name] atorvastatin (LIPITOR) 80 MG tablet Take 1 tablet (80 mg total) by mouth daily. 90 tablet 3  . carvedilol (COREG) 12.5 MG tablet Take 1 tablet (12.5 mg total) by mouth as directed. 1 tablet in the AM and 1 and 1/2 tabs in the PM x 1 week; then increase to 1 1/2 tabs twice daily 180 tablet 3  . dorzolamide-timolol (COSOPT) 22.3-6.8 MG/ML ophthalmic solution Place 1 drop into both eyes 2 (two) times daily.     . isosorbide mononitrate (IMDUR) 30 MG 24 hr tablet Take 1 tablet (30 mg  total) by mouth daily. 30 tablet 11  . LORazepam (ATIVAN) 0.5 MG tablet Take 1 tablet (0.5 mg total) by mouth at bedtime. Pt has supply 30 tablet 0  . losartan (COZAAR) 50 MG tablet Take 50 mg by mouth daily.    . metFORMIN (GLUCOPHAGE) 500 MG tablet TAKE ONE TABLET BY MOUTH TWICE DAILY WITH MEALS 180 tablet 3  . nitroGLYCERIN (NITROSTAT) 0.4 MG SL tablet Place 1 tablet (0.4 mg total) under the tongue every 5 (five) minutes x 3 doses as needed for chest pain. 25 tablet 2  . ticagrelor (BRILINTA) 90 MG TABS tablet Take 1 tablet (90 mg total) by mouth 2 (two) times daily. 60 tablet 11  . traZODone (DESYREL) 100 MG tablet Take 1 tablet (100 mg total) by mouth at bedtime as needed for sleep. Pt has supply 30 tablet 1  . venlafaxine XR (EFFEXOR-XR) 150 MG 24 hr capsule Take 1 capsule (150 mg total) by mouth daily. 30 capsule 2  . furosemide (LASIX) 40 MG tablet Take 1 tablet (40 mg total) by mouth as directed. 40 mg on Mon, Wed and Fri's; all other days 20 mg 90 tablet 3  . spironolactone (ALDACTONE) 25 MG tablet Take 0.5 tablets (12.5 mg total) by mouth daily. 45 tablet 11   No current facility-administered medications for this visit.     Allergies as of 04/03/2017 - Review Complete 04/03/2017  Allergen Reaction  Noted  . Meperidine Nausea And Vomiting 04/14/2015  . Tetracycline Swelling 04/14/2015    Vitals: BP 139/76 (Patient Position: Standing)   Pulse 93   Ht 5' 3"  (1.6 m)   Wt 175 lb 3.2 oz (79.5 kg)   BMI 31.04 kg/m  Last Weight:  Wt Readings from Last 1 Encounters:  04/03/17 175 lb 3.2 oz (79.5 kg)   Last Height:   Ht Readings from Last 1 Encounters:  04/03/17 5' 3"  (1.6 m)   Physical exam: Exam: Gen: NAD, conversant, well nourised, obese, well groomed                     CV: RRR, no MRG. No Carotid Bruits. No peripheral edema, warm, nontender Eyes: Conjunctivae clear without exudates or hemorrhage  Neuro: Detailed Neurologic Exam  Speech:    Speech is normal; fluent and spontaneous with normal comprehension.  Cognition:    The patient is oriented to person, place, and time;     recent and remote memory intact;     language fluent;     normal attention, concentration,     fund of knowledge Cranial Nerves:    The pupils are equal, round, and reactive to light. The fundi are normal and spontaneous venous pulsations are present. Visual fields are full to finger confrontation. Extraocular movements are intact. Trigeminal sensation is intact and the muscles of mastication are normal. The face is symmetric. The palate elevates in the midline. Hearing intact. Voice is normal. Shoulder shrug is normal. The tongue has normal motion without fasciculations.   Coordination:    Normal finger to nose and heel to shin. Normal rapid alternating movements.   Gait:    Heel-toe and tandem gait are normal.   Motor Observation:    No asymmetry, no atrophy, and no involuntary movements noted. Tone:    Normal muscle tone.    Posture:    Posture is normal. normal erect    Strength:    Strength is V/V in the upper and lower limbs.      Sensation:  intact to LT     Reflex Exam:  DTR's:    Deep tendon reflexes in the upper and lower extremities are normal bilaterally.   Toes:    The  toes are downgoing bilaterally.   Clonus:    Clonus is absent.       Assessment/Plan:  Patient with chronic dizziness where she feels like she is spinning.  She has a past medical history of diabetes, ischemic cardiomyopathy and chronic systolic congestive heart failure, coronary artery disease status post STEMI and PCI x2.   Sleep Evaluation: She is snoring a lot, She feels like she doesn' t get enough sleep even if she is bed for a long time, she wakes frequently, she snores heavily. Her husband notices her stop breathing. He sleeps downstairs and can hear her snoring Ess 10.  Obesity: Recommend Healthy Weight and [place] for weight loss  Vestibular Therapy for Vertigo  MRI brain to rule out intracranial etiologies such as schwannoma or stroke especially given multiple risk factors.  Orders Placed This Encounter  Procedures  . MR BRAIN W WO CONTRAST  . Ambulatory referral to Sleep Studies  . Ambulatory referral to Physical Therapy   Cc: Coral Spikes, DO  Sarina Ill, MD  [facility] [address] [place], [place] [address]  Phone [phone] Fax [phone]

## 2017-04-04 ENCOUNTER — Ambulatory Visit: Admit: 2017-04-04 | Payer: Medicare Other | Admitting: Gastroenterology

## 2017-04-04 SURGERY — COLONOSCOPY WITH PROPOFOL
Anesthesia: Choice

## 2017-04-11 ENCOUNTER — Telehealth: Payer: Self-pay | Admitting: Neurology

## 2017-04-11 NOTE — Telephone Encounter (Signed)
error 

## 2017-04-15 ENCOUNTER — Ambulatory Visit: Payer: Medicare Other | Admitting: Family Medicine

## 2017-04-18 ENCOUNTER — Other Ambulatory Visit: Payer: Medicare Other

## 2017-04-18 ENCOUNTER — Ambulatory Visit
Admission: RE | Admit: 2017-04-18 | Discharge: 2017-04-18 | Disposition: A | Discharge status: Home / Self Care | Payer: Medicare Other | Source: Ambulatory Visit | Attending: Neurology | Admitting: Neurology

## 2017-04-18 DIAGNOSIS — H538 Other visual disturbances: Secondary | ICD-10-CM

## 2017-04-18 DIAGNOSIS — R42 Dizziness and giddiness: Secondary | ICD-10-CM | POA: Diagnosis not present

## 2017-04-18 MED ORDER — GADOBENATE DIMEGLUMINE 529 MG/ML IV SOLN
15.0000 mL | Freq: Once | INTRAVENOUS | Status: AC | PRN
  Administered 2017-04-18: 15 mL via INTRAVENOUS

## 2017-04-19 ENCOUNTER — Telehealth: Payer: Self-pay | Admitting: *Deleted

## 2017-04-19 NOTE — Telephone Encounter (Signed)
-----   Message from Melvenia Beam, MD sent at 04/19/2017 12:10 PM EDT ----- MRi of the brain normal for age. Incidentally, there is chronic sinusitis seen, if she has any symptoms she should see her pcp.

## 2017-04-22 NOTE — Telephone Encounter (Signed)
Called pt w/ normal MRI results. Agreed to discuss sinusitis w/ PCP as she has appt tomorrow. Voiced appreciation for call.

## 2017-04-23 ENCOUNTER — Encounter: Payer: Self-pay | Admitting: Family Medicine

## 2017-04-23 ENCOUNTER — Ambulatory Visit (INDEPENDENT_AMBULATORY_CARE_PROVIDER_SITE_OTHER): Payer: Medicare Other | Admitting: Family Medicine

## 2017-04-23 DIAGNOSIS — R42 Dizziness and giddiness: Secondary | ICD-10-CM | POA: Diagnosis not present

## 2017-04-23 DIAGNOSIS — I255 Ischemic cardiomyopathy: Secondary | ICD-10-CM

## 2017-04-23 DIAGNOSIS — R232 Flushing: Secondary | ICD-10-CM | POA: Diagnosis not present

## 2017-04-23 MED ORDER — MECLIZINE HCL 25 MG PO TABS: 25.0000 mg | ORAL_TABLET | Freq: Three times a day (TID) | ORAL | 0 refills | Status: DC | PRN

## 2017-04-23 NOTE — Assessment & Plan Note (Signed)
New problem. Uncertain etiology/prognosis at this time. Does not appear to be menopausal in origin as it proceeded menopause. Does not appear to be medication related. She's had no improvement with Effexor. Labs unremarkable. No signs or symptoms of underlying systemic cause or cardiac cause. I informed her that I am not sure of the etiology. Will discuss with neurology.

## 2017-04-23 NOTE — Progress Notes (Signed)
Subjective:  Patient ID: Melanie Ray, female    DOB: Aug 03, 1951  Age: 66 y.o. MRN: [id number]  CC: Hot flashes  HPI:  66 year old female with an extensive cardiac history presents with complaints of hot flashes.  Patient reports that she has had hot flashes for the past 25 years. She states that these proceeded menopause. She states that over the past 6 months they have been worse. Occurring 20 times a day and 10 times or more a night. Last for 2-5 minutes and then resolve spontaneously. No association with exertion. No known relieving factors. No known inciting or exacerbating factors. She is currently on Effexor which is an off label treatment for menopausal hot flashes. She has had no improvement with this. She is currently seeing neurology for evaluation regarding dizziness. She had a negative MRI. She is requesting medication for dizziness/vertigo today. No other complaints or concerns at this time.  Social Hx   Social History   Social History  . Marital status: Married    Spouse name: N/A  . Number of children: 0  . Years of education: BS in education   Occupational History  . Retired    Social History Main Topics  . Smoking status: Former Smoker    Types: Cigarettes    Quit date: 04/13/1997  . Smokeless tobacco: Never Used  . Alcohol use 0.6 oz/week    1 Glasses of wine per week     Comment: 2-3 glasses of wine per month  . Drug use: No  . Sexual activity: No   Other Topics Concern  . None   Social History Narrative   Lives in [place] with spouse.  No children.   Retired first [employer] for over 30 years ([place] for 10 years and then in [place] for over 20 years).   Left-handed       Review of Systems  Constitutional:       Hot flashes.  Gastrointestinal: Positive for nausea.  Neurological: Positive for dizziness.   Objective:  BP 140/78 (BP Location: Left Arm, Patient Position: Sitting, Cuff Size: Normal)   Pulse 78   Temp 98.6 F (37  C) (Oral)   Resp 12   Ht 5\' 3"  (1.6 m)   Wt 178 lb 12.8 oz (81.1 kg)   SpO2 98%   BMI 31.67 kg/m   BP/Weight 04/23/2017 04/03/2017 11/27/4823  Systolic BP 003 704 888  Diastolic BP 78 76 62  Wt. (Lbs) 178.8 175.2 175.8  BMI 31.67 31.04 31.14  Some encounter information is confidential and restricted. Go to Review Flowsheets activity to see all data.   Physical Exam  Constitutional: She is oriented to person, place, and time. She appears well-developed. No distress.  Cardiovascular: Normal rate and regular rhythm.   Pulmonary/Chest: Effort normal and breath sounds normal.  Neurological: She is alert and oriented to person, place, and time.  Psychiatric: She has a normal mood and affect.  Vitals reviewed.  Lab Results  Component Value Date   WBC 6.8 01/03/2017   HGB 12.2 01/03/2017   HCT 36.9 01/03/2017   PLT 387.0 01/03/2017   GLUCOSE 163 (H) 03/21/2017   CHOL 124 03/21/2017   TRIG 114 03/21/2017   HDL 36 (L) 03/21/2017   LDLCALC 65 03/21/2017   ALT 19 03/21/2017   AST 16 03/21/2017   NA 140 03/21/2017   K 4.2 03/21/2017   CL 101 03/21/2017   CREATININE 1.02 (H) 03/21/2017   BUN 19 03/21/2017   CO2 19  03/21/2017   TSH 1.02 01/03/2017   INR 0.93 03/17/2016   HGBA1C 6.5 01/03/2017    Assessment & Plan:   Problem List Items Addressed This Visit      Cardiovascular and Mediastinum   Hot flashes    New problem. Uncertain etiology/prognosis at this time. Does not appear to be menopausal in origin as it proceeded menopause. Does not appear to be medication related. She's had no improvement with Effexor. Labs unremarkable. No signs or symptoms of underlying systemic cause or cardiac cause. I informed her that I am not sure of the etiology. Will discuss with neurology.        Other   Dizziness    Established problem, persistent. Patient desires medication. Trial of meclizine.         Meds ordered this encounter  Medications  . meclizine (ANTIVERT) 25 MG  tablet    Sig: Take 1 tablet (25 mg total) by mouth 3 (three) times daily as needed for dizziness.    Dispense:  30 tablet    Refill:  0   Follow-up: PRN  [place]

## 2017-04-23 NOTE — Patient Instructions (Signed)
Meclizine as needed.  I will discuss with neurology.  Take care  Dr. Lacinda Axon

## 2017-04-23 NOTE — Progress Notes (Signed)
Pre-visit discussion using our clinic review tool. No additional management support is needed unless otherwise documented below in the visit note.  

## 2017-04-23 NOTE — Assessment & Plan Note (Signed)
Established problem, persistent. Patient desires medication. Trial of meclizine.

## 2017-04-29 ENCOUNTER — Telehealth: Payer: Self-pay | Admitting: Family Medicine

## 2017-04-29 NOTE — Telephone Encounter (Signed)
Stop meclizine. I would not advised additional medication.  Follow up with neurology.

## 2017-04-29 NOTE — Telephone Encounter (Signed)
Pt called and stated that she took the medication Dr. Lacinda Axon gave her for dizziness and it her very tired and slept for 14 hours and didn't help with the dizziness at all. Can Dr. Lacinda Axon call something else in? Please advise, thank you!  Call pt @ [phone]

## 2017-04-29 NOTE — Telephone Encounter (Signed)
Left voice mail to call back 

## 2017-05-01 NOTE — Telephone Encounter (Signed)
Patient advised of below , stopped meclizine and she will follow up with neurology.

## 2017-05-06 ENCOUNTER — Ambulatory Visit: Payer: Medicare Other | Attending: Neurology | Admitting: Rehabilitative and Restorative Service Providers"

## 2017-05-06 VITALS — BP 158/72 | HR 74

## 2017-05-06 DIAGNOSIS — R2689 Other abnormalities of gait and mobility: Secondary | ICD-10-CM | POA: Diagnosis not present

## 2017-05-06 DIAGNOSIS — R42 Dizziness and giddiness: Secondary | ICD-10-CM | POA: Diagnosis not present

## 2017-05-06 DIAGNOSIS — R2681 Unsteadiness on feet: Secondary | ICD-10-CM | POA: Diagnosis not present

## 2017-05-06 NOTE — Patient Instructions (Addendum)
Gaze Stabilization - Tip Card  1.Target must remain in focus, not blurry, and appear stationary while head is in motion. 2.Perform exercises with small head movements (45 to either side of midline). 3.Increase speed of head motion so long as target is in focus. 4.If you wear eyeglasses, be sure you can see target through lens (therapist will give specific instructions for bifocal / progressive lenses). 5.These exercises may provoke dizziness or nausea. Work through these symptoms. If too dizzy, slow head movement slightly. Rest between each exercise. 6.Exercises demand concentration; avoid distractions. 7.For safety, perform standing exercises close to a counter, wall, corner, or next to someone.  Copyright  VHI. All rights reserved.   Gaze Stabilization - Standing Feet Apart   Feet shoulder width apart, keeping eyes on target on wall 3 feet away, tilt head down slightly and move head side to side for 30 seconds. Repeat while moving head up and down for 30 seconds. *Work up to tolerating 60 seconds, as able. Do 2-3 sessions per day.   Copyright  VHI. All rights reserved.    

## 2017-05-07 NOTE — Therapy (Signed)
[facility] [address] [place], [place], [address] Phone: [phone]   Fax:  [phone]  Physical Therapy Evaluation  Patient Details  Name: Melanie Ray MRN: [id number] Date of Birth: October 05, 1951 Referring Provider: Heide Spark, MD  Encounter Date: 05/06/2017      PT End of Session - 05/06/17 1443    Visit Number 1   Number of Visits 6   Date for PT Re-Evaluation 06/20/17   Authorization Type G code every 10th visit   PT Start Time 1405   PT Stop Time 1446   PT Time Calculation (min) 41 min   Activity Tolerance Patient tolerated treatment well   Behavior During Therapy [facility] for tasks assessed/performed      Past Medical History:  Diagnosis Date  . Anxiety   . CAD (coronary artery disease) 04/11/2016   S/p ant STEMI 5/17: LHC >> LAD proximal 80%, mid 80%, distal 50%, ostial D1 60%; LCx with LPDA lesion 30%; RCA Mild calcification with no significant stenosis in a medium caliber, nondominant RCA; LVEF is estimated at 45% with inferoapical and lateral wall akinesis >> PCI: PCI: 3.5 x 24 mm Promus DES to prox LAD, 2.5 x 12 mm Promus DES to mid LAD.   [name] Chronic systolic CHF (congestive heart failure) ([place]) 03/21/2016   Echo 01/30/17: Diff HK, mild focal basal septal hypertrophy, EF 30-35, mild AI, MAC, mild MR // Echo 06/08/16: Mild focal basal septal hypertrophy, EF 25-30%, diff HK, ant-septal AK, Gr 1 DD, mild AI, MAC, mild MR, PASP 37 mmHg // Echo 03/18/16: EF 30-35%, ant-septal AK, Gr 1 DD, mild MR, severe LAE.  [name] Depression   . Diabetes mellitus type 2 in obese ([place])   . Glaucoma   . History of acute anterior wall MI 03/17/2016  . Hyperlipemia   . Hypertension   . Ischemic cardiomyopathy 10/02/2016   Refused ICD  . Left bundle branch block     Past Surgical History:  Procedure Laterality Date  . APPENDECTOMY    . CARDIAC CATHETERIZATION N/A 03/17/2016   Procedure: Left Heart Cath and Coronary Angiography;   Surgeon: Sherren Mocha, MD;  Location: [facility];  Service: Cardiovascular;  Laterality: N/A;  . CARDIAC CATHETERIZATION N/A 03/17/2016   Procedure: Coronary Stent Intervention;  Surgeon: Sherren Mocha, MD;  Location: [facility];  Service: Cardiovascular;  Laterality: N/A;  . SMALL BOWEL REPAIR    . TONSILLECTOMY    . UTERINE FIBROID SURGERY      Vitals:   05/06/17 1421  BP: (!) 158/72  Pulse: 74         Subjective Assessment - 05/06/17 1409    Subjective The patient noted onset of dizziness after a GI infection noting dizziness, nausea, vomitting, diarrhea at onset of infection.  She notes that medications helped with nausea.  She reports that she was prescribed meclizine and took 2 on 04/28/17.  She notes she has sleepiness and fatigue with meclizine, so is no longer taking.  Dizziness is further described as "off balance", stumbling sensation.  "The room is not spinning, but my head is", and it just feels awful.  Symptoms can occur with sitting or standing and are worse with movement.  Symptoms last for 5-10 minutes and up to 1/2 a day.  She notes she usually has to lie down or drink some water.  She denies headaches, notes some hearing changes (turns TV up louder), she notes blurry vision (has h/o glaucoma), denies peripheral  neuropoathy, denies tinnitus, denies headaches.  She notes other symptoms associated with episodes of dizziness including having to hold her head and difficulty finding her words.     Pertinent History CHF, ischemic cardiomyopathy, diabetes.     Patient Stated Goals Reduce dizziness.   Currently in Pain? No/denies            [facility] PT Assessment - 05/06/17 1416      Assessment   Medical Diagnosis Vertigo   Referring Provider Heide Spark, MD   Onset Date/Surgical Date --  January 2018   Prior Therapy none     Precautions   Precautions Fall     Restrictions   Weight Bearing Restrictions No     Balance Screen   Has the patient  fallen in the past 6 months No   Has the patient had a decrease in activity level because of a fear of falling?  No   Is the patient reluctant to leave their home because of a fear of falling?  No     Home Environment   Living Environment Private residence   Living Arrangements Spouse/significant other     Prior Function   Level of Independence Independent     Observation/Other Assessments   Focus on Therapeutic Outcomes (FOTO)  65%   Other Surveys  Other Surveys   Dizziness Handicap Inventory [facility])  44%     Sensation   Light Touch Appears Intact            Vestibular Assessment - 05/06/17 1418      Vestibular Assessment   General Observation Patient ambulates into clinic today independently.  Baseline sensation of dizziness today is 3-4/10.     Symptom Behavior   Type of Dizziness Imbalance   Frequency of Dizziness daily   Duration of Dizziness 10-15 minutes   Aggravating Factors Activity in general   Relieving Factors No known relieving factors     Occulomotor Exam   Occulomotor Alignment Normal   Spontaneous Absent   Gaze-induced Absent   Smooth Pursuits Intact   Saccades Intact   Comment Has glaucoma and wears reading glasses     Vestibulo-Occular Reflex   VOR 1 Head Only (x 1 viewing) Slowed pace x 10 reps increases symptoms to 7/10.  Patient able to keep fixation on target.   Comment Head impulse test=positive to left for refixation saccade and blinking to refixate noting 8/10 symptoms.     Positional Testing   Dix-Hallpike Dix-Hallpike Right;Dix-Hallpike Left   Sidelying Test Sidelying Right;Sidelying Left   Horizontal Canal Testing Horizontal Canal Right;Horizontal Canal Left     Dix-Hallpike Right   Dix-Hallpike Right Duration Subjective reports of dizziness lasting x seconds, no nystagmus viewed in room light   Dix-Hallpike Right Symptoms No nystagmus     Dix-Hallpike Left   Dix-Hallpike Left Duration none   Dix-Hallpike Left Symptoms No nystagmus      Sidelying Right   Sidelying Right Duration mild increase in symptoms    Sidelying Right Symptoms No nystagmus     Sidelying Left   Sidelying Left Duration none   Sidelying Left Symptoms No nystagmus     Horizontal Canal Right   Horizontal Canal Right Duration notes mild increase in "dizziness"   Horizontal Canal Right Symptoms Normal     Horizontal Canal Left   Horizontal Canal Left Duration mild dizziness   Horizontal Canal Left Symptoms Normal     Positional Sensitivities   Supine to Left Side No dizziness  Supine to Right Side No dizziness   Head Turning x 5 Severe dizziness  baseline 5/10   Head Nodding x 5 --  will test at later date--sxs taking longer to get to baselin   Positional Sensitivities Comments up from sidelying="lightheaded" sensation.          Objective measurements completed on examination: See above findings.          [facility] Adult PT Treatment/Exercise - 05/06/17 1441      Neuro Re-ed    Neuro Re-ed Details  Corner balance with eyes closed.  Only mild sway noted, did not add to HEP.          Vestibular Treatment/Exercise - 05/06/17 1438      Vestibular Treatment/Exercise   Vestibular Treatment Provided Gaze   Gaze Exercises X1 Viewing Horizontal     X1 Viewing Horizontal   Foot Position standing feet apart   Comments x 10 reps due to symptoms already elevated after assessment; recommended work up to 30 seconds.                PT Education - 05/06/17 1432    Education provided Yes   Education Details HEP: gaze x 1 viewing   Person(s) Educated Patient   Methods Explanation;Demonstration;Handout   Comprehension Returned demonstration;Verbalized understanding          PT Short Term Goals - 05/07/17 0831      PT SHORT TERM GOAL #1   Title The patient will be indep with HEP for gaze adaptation, habituation, and high level balance. TARGET DATE ALL STGS 06/06/17   Time 4   Period Weeks     PT SHORT TERM GOAL #2   Title  The patient will tolerate x 1 viewing x 30 seconds with change in dizziness from baseline < 2/10 (increased at eval from 3-4/10 up to 8/10)   Time 4   Period Weeks     PT SHORT TERM GOAL #3   Title The patient will tolerate head motion horiz and vertical x 5 reps with < or equal to 2/10 change from baseline.    Time 4   Period Weeks     PT SHORT TERM GOAL #4   Title Further assess gait speed and SOT and write goals to follow for LTGs.   Time 4   Period Weeks           PT Long Term Goals - 05/07/17 0973      PT LONG TERM GOAL #1   Title The patient will reduce dizziness handicap index from 44% to < or equal to 25% to demo dec'd self perception of dizziness.  TARGET DATE ALL LTGS  06/20/17   Time 6   Period Weeks     PT LONG TERM GOAL #2   Title The patient will tolerate all bed mobility sit<>supine and sit<>sidelying, and rolling without any c/o subjective dizziness.   Time 6   Period Weeks     PT LONG TERM GOAL #3   Title LTGs for gait and SOT to follow, as indicated.   Time 6   Period Weeks                Plan - 05/07/17 5329    Clinical Impression Statement The patient is a 66 yo female presenting to OP PT with diminished use of vestibular ocular reflex, motion sensitivity, and imbalance with head motion during standing.  PT to address deficits and educate patient in HEP to improve  functional mobilty and tolerance to motion.    History and Personal Factors relevant to plan of care: DHI=44% noting severe impairment, dec'd general mobility   Clinical Presentation Evolving   Clinical Presentation due to: worsening symptoms, hindering participation in activities   Clinical Decision Making Moderate   Rehab Potential Good   PT Frequency 1x / week   PT Duration 6 weeks   PT Treatment/Interventions ADLs/Self Care Home Management;Canalith Repostioning;Neuromuscular re-education;Balance training;Therapeutic exercise;Therapeutic activities;Manual techniques;Patient/family  education;Gait training;Functional mobility training;Vestibular   PT Next Visit Plan Check gaze x 1, add habituation HEP for motion sensitivity, assess SOT and gait speed   Consulted and Agree with Plan of Care Patient      Patient will benefit from skilled therapeutic intervention in order to improve the following deficits and impairments:  Abnormal gait, Difficulty walking, Dizziness, Decreased balance, Impaired vision/preception  Visit Diagnosis: Dizziness and giddiness  Other abnormalities of gait and mobility  Unsteadiness on feet      G-Codes - 2017/06/04 0837    Functional Assessment Tool Used (Outpatient Only) DHI=44%   Functional Limitation Mobility: Walking and moving around   Mobility: Walking and Moving Around Current Status [phone]) At least 40 percent but less than 60 percent impaired, limited or restricted   Mobility: Walking and Moving Around Goal Status [phone]) At least 20 percent but less than 40 percent impaired, limited or restricted       Problem List Patient Active Problem List   Diagnosis Date Noted  . Hot flashes 04/23/2017  . Dizziness 01/03/2017  . Ischemic cardiomyopathy 10/02/2016  . CAD (coronary artery disease) 04/11/2016  . Chronic systolic CHF (congestive heart failure) ([place]) 03/21/2016  . History of acute anterior wall MI 03/18/2016  . DM type 2 (diabetes mellitus, type 2) ([place]) 08/03/2015  . Essential hypertension 07/28/2015  . HLD (hyperlipidemia) 07/28/2015  . Glaucoma 07/28/2015  . Depression 07/28/2015  . Anxiety 07/28/2015  . Insomnia 07/28/2015  . Obesity (BMI 30.0-34.9) 07/28/2015    Louretta Tantillo, PT June 04, 2017, 8:38 AM  [facility] [address] [place], [place], [address] Phone: [phone]   Fax:  [phone]  Name: Leena Tiede MRN: [id number] Date of Birth: [birth year]

## 2017-05-13 ENCOUNTER — Encounter: Payer: Medicare Other | Admitting: Rehabilitative and Restorative Service Providers"

## 2017-05-16 ENCOUNTER — Ambulatory Visit: Payer: Medicare Other | Admitting: Family Medicine

## 2017-05-17 ENCOUNTER — Ambulatory Visit (INDEPENDENT_AMBULATORY_CARE_PROVIDER_SITE_OTHER): Payer: Medicare Other | Admitting: Family Medicine

## 2017-05-17 ENCOUNTER — Encounter: Payer: Self-pay | Admitting: Family Medicine

## 2017-05-17 VITALS — BP 118/68 | HR 76 | Temp 98.2°F | Resp 16 | Wt 177.1 lb

## 2017-05-17 DIAGNOSIS — I255 Ischemic cardiomyopathy: Secondary | ICD-10-CM | POA: Diagnosis not present

## 2017-05-17 DIAGNOSIS — M79675 Pain in left toe(s): Secondary | ICD-10-CM | POA: Insufficient documentation

## 2017-05-17 LAB — URIC ACID: Uric Acid, Serum: 9.2 mg/dL — ABNORMAL HIGH (ref 2.4–7.0)

## 2017-05-17 MED ORDER — ONDANSETRON HCL 4 MG PO TABS: 4.0000 mg | ORAL_TABLET | Freq: Three times a day (TID) | ORAL | 0 refills | Status: DC | PRN

## 2017-05-17 NOTE — Assessment & Plan Note (Signed)
New problem. Concern for gout. Some of her history is consistent with gout while other portions or not. Her exam is unrevealing today except for pain at the MTP joint around the bunion. Uric acid today. Tylenol as needed for pain.

## 2017-05-17 NOTE — Progress Notes (Signed)
Subjective:  Patient ID: Melanie Ray, female    DOB: 12-08-1950  Age: 66 y.o. MRN: [id number]  CC: L great toe pain  HPI:  66 year old female with an extensive past medical history particularly cardiac disease presents with complaints of left great toe pain.  L great toe pain  Started on Sunday.  Was severe. She states that it was so severe that she had difficulty with anything touching it.  No associated redness. Questionable mild swelling.  No recent fall, trauma, injury.  It has since improved as far as the severity is concerned.  She still has pain which is worse with range of motion/activity.  No known relieving factors.  No medications tried.  No other associated symptoms. No other complaints at this time.  Social Hx   Social History   Social History  . Marital status: Married    Spouse name: N/A  . Number of children: 0  . Years of education: BS in education   Occupational History  . Retired    Social History Main Topics  . Smoking status: Former Smoker    Types: Cigarettes    Quit date: 04/13/1997  . Smokeless tobacco: Never Used  . Alcohol use 0.6 oz/week    1 Glasses of wine per week     Comment: 2-3 glasses of wine per month  . Drug use: No  . Sexual activity: No   Other Topics Concern  . None   Social History Narrative   Lives in [place] with spouse.  No children.   Retired first [employer] for over 30 years ([place] for 10 years and then in [place] for over 20 years).   Left-handed       Review of Systems  Constitutional: Negative.   Musculoskeletal:       Left great toe pain.   Objective:  BP 118/68 (BP Location: Left Arm, Patient Position: Sitting, Cuff Size: Normal)   Pulse 76   Temp 98.2 F (36.8 C)   Resp 16   Wt 177 lb 2 oz (80.3 kg)   SpO2 96%   BMI 31.38 kg/m   BP/Weight 05/17/2017 05/06/2017 2/87/6811  Systolic BP 572 620 355  Diastolic BP 68 72 78  Wt. (Lbs) 177.13 - 178.8  BMI 31.38 - 31.67  Some  encounter information is confidential and restricted. Go to Review Flowsheets activity to see all data.    Physical Exam  Constitutional: She is oriented to person, place, and time. She appears well-developed and well-nourished. No distress.  HENT:  Head: Normocephalic and atraumatic.  Eyes: Conjunctivae are normal. No scleral icterus.  Pulmonary/Chest: Effort normal. No respiratory distress.  Musculoskeletal:  Left great toe -bunion noted. Tenderness to palpation at the MTP joint. Mild pain with range of motion. No warmth. No erythema.  Neurological: She is alert and oriented to person, place, and time.  Skin:  Left great toe without erythema.  Psychiatric: She has a normal mood and affect.  Vitals reviewed.   Lab Results  Component Value Date   WBC 6.8 01/03/2017   HGB 12.2 01/03/2017   HCT 36.9 01/03/2017   PLT 387.0 01/03/2017   GLUCOSE 163 (H) 03/21/2017   CHOL 124 03/21/2017   TRIG 114 03/21/2017   HDL 36 (L) 03/21/2017   LDLCALC 65 03/21/2017   ALT 19 03/21/2017   AST 16 03/21/2017   NA 140 03/21/2017   K 4.2 03/21/2017   CL 101 03/21/2017   CREATININE 1.02 (H) 03/21/2017   BUN  19 03/21/2017   CO2 19 03/21/2017   TSH 1.02 01/03/2017   INR 0.93 03/17/2016   HGBA1C 6.5 01/03/2017    Assessment & Plan:   Problem List Items Addressed This Visit      Other   Great toe pain, left - Primary    New problem. Concern for gout. Some of her history is consistent with gout while other portions or not. Her exam is unrevealing today except for pain at the MTP joint around the bunion. Uric acid today. Tylenol as needed for pain.      Relevant Orders   Uric acid     Meds ordered this encounter  Medications  . ondansetron (ZOFRAN) 4 MG tablet    Sig: Take 1 tablet (4 mg total) by mouth every 8 (eight) hours as needed for nausea or vomiting.    Dispense:  20 tablet    Refill:  0   Follow-up: PRN  [place]

## 2017-05-17 NOTE — Patient Instructions (Signed)
Tylenol as needed.  We will call with the results.  Take care  Dr. Lacinda Axon

## 2017-05-20 ENCOUNTER — Telehealth: Payer: Self-pay | Admitting: Neurology

## 2017-05-20 ENCOUNTER — Encounter: Payer: Self-pay | Admitting: Neurology

## 2017-05-20 ENCOUNTER — Telehealth: Payer: Self-pay | Admitting: Family Medicine

## 2017-05-20 ENCOUNTER — Ambulatory Visit (INDEPENDENT_AMBULATORY_CARE_PROVIDER_SITE_OTHER): Payer: Medicare Other | Admitting: Neurology

## 2017-05-20 VITALS — BP 107/59 | HR 78 | Ht 63.0 in | Wt 172.0 lb

## 2017-05-20 DIAGNOSIS — E669 Obesity, unspecified: Secondary | ICD-10-CM | POA: Diagnosis not present

## 2017-05-20 DIAGNOSIS — Z955 Presence of coronary angioplasty implant and graft: Secondary | ICD-10-CM | POA: Diagnosis not present

## 2017-05-20 DIAGNOSIS — R0681 Apnea, not elsewhere classified: Secondary | ICD-10-CM

## 2017-05-20 DIAGNOSIS — G4719 Other hypersomnia: Secondary | ICD-10-CM | POA: Diagnosis not present

## 2017-05-20 DIAGNOSIS — R0683 Snoring: Secondary | ICD-10-CM | POA: Diagnosis not present

## 2017-05-20 DIAGNOSIS — I5022 Chronic systolic (congestive) heart failure: Secondary | ICD-10-CM | POA: Diagnosis not present

## 2017-05-20 DIAGNOSIS — I255 Ischemic cardiomyopathy: Secondary | ICD-10-CM

## 2017-05-20 DIAGNOSIS — I214 Non-ST elevation (NSTEMI) myocardial infarction: Secondary | ICD-10-CM

## 2017-05-20 DIAGNOSIS — R351 Nocturia: Secondary | ICD-10-CM | POA: Diagnosis not present

## 2017-05-20 NOTE — Telephone Encounter (Signed)
Per Dr Lacinda Axon no alcohol limit amount of meat protien in diet.

## 2017-05-20 NOTE — Patient Instructions (Signed)

## 2017-05-20 NOTE — Telephone Encounter (Signed)
Called pt and advised her to request being placed on the waiting list for [facility]. Then they will be able to call her to schedule an appt when one becomes available. May also call back w/ any additional questions.

## 2017-05-20 NOTE — Telephone Encounter (Signed)
Pt has questions about her gout. She would like to know what she should not eat. Please advise.

## 2017-05-20 NOTE — Progress Notes (Signed)
Subjective:    Patient ID: Melanie Ray is a 66 y.o. female.  HPI     Star Age, MD, PhD [facility] [address], [address] [place], [place] [address]  Dear Berta Minor,   I saw your patient, Melanie Ray, upon your kind request in my clinic today for initial consultation of her sleep disorder, in particular, concern for underlying obstructive sleep apnea. The patient is unaccompanied today. As you know, Melanie Ray is a 66 year old right-handed woman with an underlying complex medical history of coronary artery disease with status post STEMI, chronic systolic congestive heart failure, ischemic cardiomyopathy, left bundle branch block, glaucoma, depression, type 2 diabetes, hyperlipidemia, hypertension, dizziness and obesity, who reports snoring and excessive daytime somnolence. I reviewed your office note from 04/03/2017. You ordered a brain MRI. She had a brain MRI with and without contrast on 04/18/2017 which I reviewed: IMPRESSION:  This MRI of the brain with and without contrast shows the following: 1.    Mild cortical atrophy and mild chronic microvascular ischemic changes. 2.    Mild chronic maxillary, moderate chronic ethmoid and mild right frontal chronic sinusitis. 3.    The internal auditory canals and the eighth nerves appear normal. 4.    There is a normal enhancement pattern and there are no acute findings.   Her Epworth sleepiness score is 10 out of 24, fatigue score is 32 out of 63. She lives with her husband, they have no children. She is retired. She quit smoking in 1998, drinks alcohol occasionally, drinks caffeine in the form of coffee, 2 cups per day on average. She and her husband moved from [place] in 2014. She was a an Automotive engineer for 32 years. She has severe nocturia about 3-5 times per night. She leaves the TV on at night, d/t anxiety and fear of the dark. Her husband sleeps downstairs, she sleeps upstairs.  He has noted pauses in her breathing while asleep which has scared him. He also reports that he can still hear her downstairs with her loud snoring. She denies telltale symptoms of restless leg syndrome. She does not have a family history of OSA. Bedtime is between 9 and 11, she typically goes upstairs around 9 and tends to watch TV, sometimes asleep by 9:30, sometimes later. Wakeup time is between 6:15 and 8 AM. She does take a nap almost on a daily basis around 2 PM and can sleep up to 2 or 3 hours at times.  Her Past Medical History Is Significant For: Past Medical History:  Diagnosis Date  . Anxiety   . CAD (coronary artery disease) 04/11/2016   S/p ant STEMI 5/17: LHC >> LAD proximal 80%, mid 80%, distal 50%, ostial D1 60%; LCx with LPDA lesion 30%; RCA Mild calcification with no significant stenosis in a medium caliber, nondominant RCA; LVEF is estimated at 45% with inferoapical and lateral wall akinesis >> PCI: PCI: 3.5 x 24 mm Promus DES to prox LAD, 2.5 x 12 mm Promus DES to mid LAD.   [name] Chronic systolic CHF (congestive heart failure) ([place]) 03/21/2016   Echo 01/30/17: Diff HK, mild focal basal septal hypertrophy, EF 30-35, mild AI, MAC, mild MR // Echo 06/08/16: Mild focal basal septal hypertrophy, EF 25-30%, diff HK, ant-septal AK, Gr 1 DD, mild AI, MAC, mild MR, PASP 37 mmHg // Echo 03/18/16: EF 30-35%, ant-septal AK, Gr 1 DD, mild MR, severe LAE.  [name] Depression   . Diabetes mellitus type 2 in obese (  [place])   . Glaucoma   . History of acute anterior wall MI 03/17/2016  . Hyperlipemia   . Hypertension   . Ischemic cardiomyopathy 10/02/2016   Refused ICD  . Left bundle branch block     Her Past Surgical History Is Significant For: Past Surgical History:  Procedure Laterality Date  . APPENDECTOMY    . CARDIAC CATHETERIZATION N/A 03/17/2016   Procedure: Left Heart Cath and Coronary Angiography;  Surgeon: Sherren Mocha, MD;  Location: [facility];  Service: Cardiovascular;  Laterality:  N/A;  . CARDIAC CATHETERIZATION N/A 03/17/2016   Procedure: Coronary Stent Intervention;  Surgeon: Sherren Mocha, MD;  Location: [facility];  Service: Cardiovascular;  Laterality: N/A;  . SMALL BOWEL REPAIR    . TONSILLECTOMY    . UTERINE FIBROID SURGERY      Her Family History Is Significant For: Family History  Problem Relation Age of Onset  . Anxiety disorder Mother   . Paranoid behavior Mother   . Hypertension Mother   . Heart failure Mother   . Stroke Mother   . Dementia Mother   . High Cholesterol Mother   . Hypertension Father   . High Cholesterol Father   . Mood Disorder Sister   . Stroke Sister   . Anxiety disorder Maternal Aunt   . Drug abuse Cousin   . Tuberculosis Paternal Grandfather     Her Social History Is Significant For: Social History   Social History  . Marital status: Married    Spouse name: N/A  . Number of children: 0  . Years of education: BS in education   Occupational History  . Retired    Social History Main Topics  . Smoking status: Former Smoker    Types: Cigarettes    Quit date: 04/13/1997  . Smokeless tobacco: Never Used  . Alcohol use 0.6 oz/week    1 Glasses of wine per week     Comment: 2-3 glasses of wine per month  . Drug use: No  . Sexual activity: No   Other Topics Concern  . None   Social History Narrative   Lives in [place] with spouse.  No children.   Retired first [employer] for over 30 years ([employer] for 10 years and then in [place] for over 20 years).   Left-handed       Her Allergies Are:  Allergies  Allergen Reactions  . Meperidine Nausea And Vomiting  . Tetracycline Swelling  :   Her Current Medications Are:  Outpatient Encounter Prescriptions as of 05/20/2017  Medication Sig  . aspirin EC 81 MG EC tablet Take 1 tablet (81 mg total) by mouth daily.  [name] atorvastatin (LIPITOR) 80 MG tablet Take 1 tablet (80 mg total) by mouth daily.  . carvedilol (COREG) 12.5 MG tablet Take 1 tablet  (12.5 mg total) by mouth as directed. 1 tablet in the AM and 1 and 1/2 tabs in the PM x 1 week; then increase to 1 1/2 tabs twice daily  . dorzolamide-timolol (COSOPT) 22.3-6.8 MG/ML ophthalmic solution Place 1 drop into both eyes 2 (two) times daily.   . isosorbide mononitrate (IMDUR) 30 MG 24 hr tablet Take 1 tablet (30 mg total) by mouth daily.  [name] LORazepam (ATIVAN) 0.5 MG tablet Take 1 tablet (0.5 mg total) by mouth at bedtime. Pt has supply  . losartan (COZAAR) 50 MG tablet Take 50 mg by mouth daily.  . meclizine (ANTIVERT) 25 MG tablet Take 1 tablet (25  mg total) by mouth 3 (three) times daily as needed for dizziness.  . metFORMIN (GLUCOPHAGE) 500 MG tablet TAKE ONE TABLET BY MOUTH TWICE DAILY WITH MEALS  . nitroGLYCERIN (NITROSTAT) 0.4 MG SL tablet Place 1 tablet (0.4 mg total) under the tongue every 5 (five) minutes x 3 doses as needed for chest pain.  [name] ondansetron (ZOFRAN) 4 MG tablet Take 1 tablet (4 mg total) by mouth every 8 (eight) hours as needed for nausea or vomiting.  . ticagrelor (BRILINTA) 90 MG TABS tablet Take 1 tablet (90 mg total) by mouth 2 (two) times daily.  . traZODone (DESYREL) 100 MG tablet Take 1 tablet (100 mg total) by mouth at bedtime as needed for sleep. Pt has supply  . venlafaxine XR (EFFEXOR-XR) 150 MG 24 hr capsule Take 1 capsule (150 mg total) by mouth daily.  . furosemide (LASIX) 40 MG tablet Take 1 tablet (40 mg total) by mouth as directed. 40 mg on Mon, Wed and Fri's; all other days 20 mg  . spironolactone (ALDACTONE) 25 MG tablet Take 0.5 tablets (12.5 mg total) by mouth daily.   No facility-administered encounter medications on file as of 05/20/2017.   :  Review of Systems:  Out of a complete 14 point review of systems, all are reviewed and negative with the exception of these symptoms as listed below: Review of Systems  Neurological:       Pt presents today to discuss her sleep. Pt endorses snoring. Pt has never had a sleep study because she is  reluctant to use a cpap.  Epworth Sleepiness Scale 0= would never doze 1= slight chance of dozing 2= moderate chance of dozing 3= high chance of dozing  Sitting and reading: 3 Watching TV: 3 Sitting inactive in a public place (ex. Theater or meeting): 0 As a passenger in a car for an hour without a break: 1 Lying down to rest in the afternoon: 2 Sitting and talking to someone: 0 Sitting quietly after lunch (no alcohol): 1 In a car, while stopped in traffic: 0 Total: 10     Objective:  Neurological Exam  Physical Exam Physical Examination:   Vitals:   05/20/17 1329  BP: (!) 107/59  Pulse: 78    General Examination: The patient is a very pleasant 66 y.o. female in no acute distress. She appears well-developed and well-nourished and well groomed.   HEENT: Normocephalic, atraumatic, pupils are equal, round and reactive to light and accommodation. Extraocular tracking is good without limitation to gaze excursion or nystagmus noted. Normal smooth pursuit is noted. Hearing is grossly intact. Face is symmetric with normal facial animation and normal facial sensation. Speech is clear with no dysarthria noted. There is no hypophonia. There is no lip, neck/head, jaw or voice tremor. Neck is supple with full range of passive and active motion. There are no carotid bruits on auscultation. Oropharynx exam reveals: mild mouth dryness, adequate dental hygiene and mild airway crowding, due to redundant soft palate and small airway entry. Mallampati is class II. Tongue protrudes centrally and palate elevates symmetrically. Tonsils are absent. Neck size is 16.25 inches. She has a Mild overbite.   Chest: Clear to auscultation without wheezing, rhonchi or crackles noted.  Heart: S1+S2+0, regular and normal without murmurs, rubs or gallops noted.   Abdomen: Soft, non-tender and non-distended with normal bowel sounds appreciated on auscultation.  Extremities: There is no pitting edema in the  distal lower extremities bilaterally. Pedal pulses are intact.  Skin: Warm and dry  without trophic changes noted.  Musculoskeletal: exam reveals no obvious joint deformities, tenderness or joint swelling or erythema.   Neurologically:  Mental status: The patient is awake, alert and oriented in all 4 spheres. Her immediate and remote memory, attention, language skills and fund of knowledge are appropriate. There is no evidence of aphasia, agnosia, apraxia or anomia. Speech is clear with normal prosody and enunciation. Thought process is linear. Mood is normal and affect is normal.  Cranial nerves II - XII are as described above under HEENT exam. In addition: shoulder shrug is normal with equal shoulder height noted. Motor exam: Normal bulk, strength and tone is noted. There is no drift, tremor or rebound. Romberg is negative. Reflexes are 2+ throughout. Babinski: Toes are flexor bilaterally. Fine motor skills and coordination: intact with normal finger taps, normal hand movements, normal rapid alternating patting, normal foot taps and normal foot agility.  Cerebellar testing: No dysmetria or intention tremor on finger to nose testing. Heel to shin is unremarkable bilaterally. There is no truncal or gait ataxia.  Sensory exam: intact to light touch in the upper and lower extremities.  Gait, station and balance: She stands easily. No veering to one side is noted. No leaning to one side is noted. Posture is age-appropriate and stance is narrow based. Gait shows normal stride length and normal pace. No problems turning are noted. Tandem walk is difficult for her. She does report mild lightheadedness upon standing, no clear vertigo symptoms.   Assessment and Plan:  In summary, Melanie Ray is a very pleasant 66 y.o.-year old female with an underlying complex medical history of coronary artery disease with status post STEMI (in 5/17 and s/p stents), chronic systolic congestive heart failure, ischemic  cardiomyopathy, left bundle branch block, glaucoma, depression, type 2 diabetes, hyperlipidemia, hypertension, Hx SBO in 97 with small bowel partial resection, hx of dizziness and obesity, whose history and physical exam are concerning for obstructive sleep apnea (OSA). I had a long chat with the patient about my findings and the diagnosis of OSA, its prognosis and treatment options. We talked about medical treatments, surgical interventions and non-pharmacological approaches. I explained in particular the risks and ramifications of untreated moderate to severe OSA, especially with respect to developing cardiovascular disease down the Road, including congestive heart failure, difficult to treat hypertension, cardiac arrhythmias, or stroke. Even type 2 diabetes has, in part, been linked to untreated OSA. Symptoms of untreated OSA include daytime sleepiness, memory problems, mood irritability and mood disorder such as depression and anxiety, lack of energy, as well as recurrent headaches, especially morning headaches. We talked about trying to maintain a healthy lifestyle in general, as well as the importance of weight control. I encouraged the patient to eat healthy, exercise daily and keep well hydrated, to keep a scheduled bedtime and wake time routine, to not skip any meals and eat healthy snacks in between meals. I advised the patient not to drive when feeling sleepy. I recommended the following at this time: sleep study with potential positive airway pressure titration. (We will score hypopneas at 4%).   I explained the sleep test procedure to the patient and also outlined possible surgical and non-surgical treatment options of OSA, including the use of a custom-made dental device (which would require a referral to a specialist dentist or oral surgeon), upper airway surgical options, such as pillar implants, radiofrequency surgery, tongue base surgery, and UPPP (which would involve a referral to an ENT  surgeon). Rarely, jaw surgery such as  mandibular advancement may be considered.  I also explained the CPAP treatment option to the patient, who indicated that she would be willing to try CPAP if the need arises. I explained the importance of being compliant with PAP treatment, not only for insurance purposes but primarily to improve Her symptoms, and for the patient's long term health benefit, including to reduce Her cardiovascular risks. I answered all her questions today and the patient was in agreement. I will likely see her back after the sleep study is completed and encouraged her to call with any interim questions, concerns, problems or updates.   Thank you very much for allowing me to participate in the care of this nice patient. If I can be of any further assistance to you please do not hesitate to talk to me.  Sincerely,   Star Age, MD, PhD

## 2017-05-20 NOTE — Telephone Encounter (Signed)
Pt said she has called [facility] and has been told each time they are booked for the month and to call back the next month. Then when she calls back she is told the schedule is booked until the next month. She is not being scheduled an appt when calls. Please call

## 2017-05-22 ENCOUNTER — Other Ambulatory Visit: Payer: Self-pay | Admitting: Family Medicine

## 2017-05-22 MED ORDER — ALLOPURINOL 100 MG PO TABS: 100.0000 mg | ORAL_TABLET | Freq: Every day | ORAL | 1 refills | Status: DC

## 2017-05-22 MED ORDER — COLCHICINE 0.6 MG PO TABS: 0.6000 mg | ORAL_TABLET | Freq: Every day | ORAL | 1 refills | Status: DC

## 2017-05-23 ENCOUNTER — Ambulatory Visit: Payer: Medicare Other | Admitting: Rehabilitative and Restorative Service Providers"

## 2017-05-23 ENCOUNTER — Other Ambulatory Visit: Payer: Self-pay

## 2017-05-23 VITALS — BP 117/62 | HR 80

## 2017-05-23 DIAGNOSIS — R2689 Other abnormalities of gait and mobility: Secondary | ICD-10-CM | POA: Diagnosis not present

## 2017-05-23 DIAGNOSIS — R2681 Unsteadiness on feet: Secondary | ICD-10-CM

## 2017-05-23 DIAGNOSIS — R42 Dizziness and giddiness: Secondary | ICD-10-CM | POA: Diagnosis not present

## 2017-05-23 MED ORDER — NITROGLYCERIN 0.4 MG SL SUBL: 0.4000 mg | SUBLINGUAL_TABLET | SUBLINGUAL | 2 refills | Status: DC | PRN

## 2017-05-23 NOTE — Patient Instructions (Signed)
Gaze Stabilization - Tip Card  1.Target must remain in focus, not blurry, and appear stationary while head is in motion. 2.Perform exercises with small head movements (45 to either side of midline). 3.Increase speed of head motion so long as target is in focus. 4.If you wear eyeglasses, be sure you can see target through lens (therapist will give specific instructions for bifocal / progressive lenses). 5.These exercises may provoke dizziness or nausea. Work through these symptoms. If too dizzy, slow head movement slightly. Rest between each exercise. 6.Exercises demand concentration; avoid distractions. 7.For safety, perform standing exercises close to a counter, wall, corner, or next to someone.  Copyright  VHI. All rights reserved.   Gaze Stabilization - Standing Feet Apart   Feet shoulder width apart, keeping eyes on target on wall 3 feet away, tilt head down slightly and move head side to side for 30 seconds. Repeat while moving head up and down for 30 seconds. *Work up to tolerating 60 seconds, as able. Do 2-3 sessions per day.   Copyright  VHI. All rights reserved.   Feet Apart (Compliant Surface) Varied Arm Positions - Eyes Closed    Stand on compliant surface: _pillow____ with feet shoulder width apart and arms out. Close eyes and visualize upright position. Hold__30__ seconds. Repeat _3___ times per session. Do __1-2__ sessions per day. CAN MOVE FEET TOGETHER AS ABLE.  Copyright  VHI. All rights reserved.   Feet Apart (Compliant Surface) Head Motion - Eyes Open    With eyes open, standing on compliant surface: __pillow____, feet shoulder width apart, move head slowly: up and down 5 times.  Rest a few seconds.  Then do side to side head motion 5 times.  Do __1-2__ sessions per day.  Copyright  VHI. All rights reserved.

## 2017-05-24 NOTE — Therapy (Signed)
[place] [address] [place], [place], [address] Phone: [phone]   Fax:  [phone]  Physical Therapy Treatment  Patient Details  Name: Melanie Ray MRN: [id number] Date of Birth: 10-22-1951 Referring Provider: Heide Spark, MD  Encounter Date: 05/23/2017      PT End of Session - 05/23/17 1025    Visit Number 2   Number of Visits 6   Date for PT Re-Evaluation 06/20/17   Authorization Type G code every 10th visit   PT Start Time 1020   PT Stop Time 1100   PT Time Calculation (min) 40 min   Activity Tolerance Patient tolerated treatment well   Behavior During Therapy [facility] for tasks assessed/performed      Past Medical History:  Diagnosis Date  . Anxiety   . CAD (coronary artery disease) 04/11/2016   S/p ant STEMI 5/17: LHC >> LAD proximal 80%, mid 80%, distal 50%, ostial D1 60%; LCx with LPDA lesion 30%; RCA Mild calcification with no significant stenosis in a medium caliber, nondominant RCA; LVEF is estimated at 45% with inferoapical and lateral wall akinesis >> PCI: PCI: 3.5 x 24 mm Promus DES to prox LAD, 2.5 x 12 mm Promus DES to mid LAD.   [name] Chronic systolic CHF (congestive heart failure) ([place]) 03/21/2016   Echo 01/30/17: Diff HK, mild focal basal septal hypertrophy, EF 30-35, mild AI, MAC, mild MR // Echo 06/08/16: Mild focal basal septal hypertrophy, EF 25-30%, diff HK, ant-septal AK, Gr 1 DD, mild AI, MAC, mild MR, PASP 37 mmHg // Echo 03/18/16: EF 30-35%, ant-septal AK, Gr 1 DD, mild MR, severe LAE.  [name] Depression   . Diabetes mellitus type 2 in obese ([place])   . Glaucoma   . History of acute anterior wall MI 03/17/2016  . Hyperlipemia   . Hypertension   . Ischemic cardiomyopathy 10/02/2016   Refused ICD  . Left bundle branch block     Past Surgical History:  Procedure Laterality Date  . APPENDECTOMY    . CARDIAC CATHETERIZATION N/A 03/17/2016   Procedure: Left Heart Cath and Coronary Angiography;   Surgeon: Sherren Mocha, MD;  Location: [place] [facility];  Service: Cardiovascular;  Laterality: N/A;  . CARDIAC CATHETERIZATION N/A 03/17/2016   Procedure: Coronary Stent Intervention;  Surgeon: Sherren Mocha, MD;  Location: [place] [facility];  Service: Cardiovascular;  Laterality: N/A;  . SMALL BOWEL REPAIR    . TONSILLECTOMY    . UTERINE FIBROID SURGERY      Vitals:   05/23/17 1025 05/23/17 1029  BP: (!) 115/58 117/62  Pulse: 79 80        Subjective Assessment - 05/23/17 1025    Subjective The patient reports that she has felt significant improvement in symptoms, however she is unsteady this morning upon walking into clinic.  She also notes that she did not have time to do exercises at recommended frequency.  She notes her vision feels blurry at rest.    Pertinent History CHF, ischemic cardiomyopathy, diabetes.     Patient Stated Goals Reduce dizziness.   Currently in Pain? No/denies            [facility] PT Assessment - 05/23/17 1028      Standardized Balance Assessment   Standardized Balance Assessment Balance Master Testing   Balance Master Testing Sensory Organization Test     Balance Master Testing    Results Patient scored 40% composite equilibrium score (compared ot age/height normative values of 68%.  Patient is WNls use of somatosensory feedback, moderately dec'd use of visual inputs *45% compared to 80% norm), and siugnificantly reduced use of vest inputs (10% compared to 55% norm).                       [place] Adult PT Treatment/Exercise - 05/23/17 1028      Ambulation/Gait   Ambulation/Gait Yes   Ambulation/Gait Assistance 6: Modified independent (Device/Increase time)  pt veered to R and reached for walls intermittently today   Ambulation Distance (Feet) 200 Feet   Assistive device None   Ambulation Surface Level;Indoor   Gait velocity 2.78 ft/sec     Self-Care   Self-Care Other Self-Care Comments   Other Self-Care Comments  Discussed  hydration and drinking more water to prevent drop in BP as patient notes more of a lightheaded sensation today.     Neuro Re-ed    Neuro Re-ed Details  Corner balance exercises on compliant surfaces with eyes closed, then with eyes open and head motion.  Provided for HEP.          Vestibular Treatment/Exercise - 05/23/17 1218      Vestibular Treatment/Exercise   Vestibular Treatment Provided Gaze   Gaze Exercises X1 Viewing Horizontal     X1 Viewing Horizontal   Foot Position standing feet apart   Comments reviewed and recommended patient increase repetitions for HEP.                 PT Education - 05/24/17 1214    Education provided Yes   Education Details HEP: added compliant surface with eyes closed and compliant surface with head motion   Person(s) Educated Patient   Methods Explanation;Demonstration;Handout   Comprehension Verbalized understanding;Returned demonstration          PT Short Term Goals - 05/24/17 1219      PT SHORT TERM GOAL #1   Title The patient will be indep with HEP for gaze adaptation, habituation, and high level balance. TARGET DATE ALL STGS 06/06/17   Time 4   Period Weeks     PT SHORT TERM GOAL #2   Title The patient will tolerate x 1 viewing x 30 seconds with change in dizziness from baseline < 2/10 (increased at eval from 3-4/10 up to 8/10)   Time 4   Period Weeks     PT SHORT TERM GOAL #3   Title The patient will tolerate head motion horiz and vertical x 5 reps with < or equal to 2/10 change from baseline.    Time 4   Period Weeks     PT SHORT TERM GOAL #4   Title Further assess gait speed and SOT and write goals to follow for LTGs.   Baseline Met on 05/23/17 scoring 2.78 ft/sec gait speed and 40% SOT score.   Time 4   Period Weeks   Status Achieved           PT Long Term Goals - 05/24/17 1220      PT LONG TERM GOAL #1   Title The patient will reduce dizziness handicap index from 44% to < or equal to 25% to demo dec'd  self perception of dizziness.  TARGET DATE ALL LTGS  06/20/17   Time 6   Period Weeks     PT LONG TERM GOAL #2   Title The patient will tolerate all bed mobility sit<>supine and sit<>sidelying, and rolling without any c/o subjective dizziness.   Time 6  Period Weeks     PT LONG TERM GOAL #3   Title LTGs for gait and SOT to follow, as indicated.   Time 6   Period Weeks   Status Achieved     PT LONG TERM GOAL #4   Title Improve gait speed from 2.17f/sec to > or equal to 3.1 ft/sec to demo improved gait/mobility.   Time 6   Period Weeks   Status New     PT LONG TERM GOAL #5   Title The patient will improve SOT from 40% equilibrium score up to > or equal to 55% to demo improving multi-sensory balance use.   Time 6   Period Weeks               Plan - 05/24/17 1221    Clinical Impression Statement The patient demonstrates reliance on somatosensory input per SOT testing.  PT added HEP to address deficits on compliant surfaces.  Patient notes improvement from initiation of VOR training at evaluation.  Continue to STGs/LTGs (updated today with gait speed and SOT goal).    PT Treatment/Interventions ADLs/Self Care Home Management;Canalith Repostioning;Neuromuscular re-education;Balance training;Therapeutic exercise;Therapeutic activities;Manual techniques;Patient/family education;Gait training;Functional mobility training;Vestibular   PT Next Visit Plan Motion sensitivity HEP, progress gaze, multi-sensory balance training.   Consulted and Agree with Plan of Care Patient      Patient will benefit from skilled therapeutic intervention in order to improve the following deficits and impairments:  Abnormal gait, Difficulty walking, Dizziness, Decreased balance, Impaired vision/preception  Visit Diagnosis: Dizziness and giddiness  Other abnormalities of gait and mobility  Unsteadiness on feet     Problem List Patient Active Problem List   Diagnosis Date Noted  . Great toe  pain, left 05/17/2017  . Hot flashes 04/23/2017  . Dizziness 01/03/2017  . Ischemic cardiomyopathy 10/02/2016  . CAD (coronary artery disease) 04/11/2016  . Chronic systolic CHF (congestive heart failure) ([place] 03/21/2016  . History of acute anterior wall MI 03/18/2016  . DM type 2 (diabetes mellitus, type 2) ([place] 08/03/2015  . Essential hypertension 07/28/2015  . HLD (hyperlipidemia) 07/28/2015  . Glaucoma 07/28/2015  . Depression 07/28/2015  . Anxiety 07/28/2015  . Insomnia 07/28/2015  . Obesity (BMI 30.0-34.9) 07/28/2015    Katrin Grabel, PT 05/24/2017, 12:22 PM  [place] [address]Phone: [phone]  Fax:  [phone] Name: GAffie GasnerMRN: [id number] of Birth: 6Dec 14, 1952

## 2017-05-30 ENCOUNTER — Ambulatory Visit: Payer: Medicare Other | Admitting: Rehabilitative and Restorative Service Providers"

## 2017-05-30 ENCOUNTER — Encounter (INDEPENDENT_AMBULATORY_CARE_PROVIDER_SITE_OTHER): Payer: Medicare Other | Admitting: Family Medicine

## 2017-05-30 DIAGNOSIS — R2689 Other abnormalities of gait and mobility: Secondary | ICD-10-CM

## 2017-05-30 DIAGNOSIS — R2681 Unsteadiness on feet: Secondary | ICD-10-CM | POA: Diagnosis not present

## 2017-05-30 DIAGNOSIS — R42 Dizziness and giddiness: Secondary | ICD-10-CM

## 2017-05-30 NOTE — Patient Instructions (Signed)
Gaze Stabilization - Tip Card  1.Target must remain in focus, not blurry, and appear stationary while head is in motion. 2.Perform exercises with small head movements (45 to either side of midline). 3.Increase speed of head motion so long as target is in focus. 4.If you wear eyeglasses, be sure you can see target through lens (therapist will give specific instructions for bifocal / progressive lenses). 5.These exercises may provoke dizziness or nausea. Work through these symptoms. If too dizzy, slow head movement slightly. Rest between each exercise. 6.Exercises demand concentration; avoid distractions. 7.For safety, perform standing exercises close to a counter, wall, corner, or next to someone.  Copyright  VHI. All rights reserved.  Gaze Stabilization - Standing Feet Apart   Feet shoulder width apart, keeping eyes on target on wall 3 feet away, tilt head down slightly and move head side to side for 30 seconds. Repeat while moving head up and down for 30 seconds. *Work up to tolerating 60 seconds, as able. Do 2-3 sessions per day.   Copyright  VHI. All rights reserved.  Feet Apart (Compliant Surface) Varied Arm Positions - Eyes Closed    Stand on compliant surface: _pillow____ with feet shoulder width apart and arms out. Close eyes and visualize upright position. Hold__30__ seconds. Repeat _3___ times per session. Do __1-2__ sessions per day. CAN MOVE FEET TOGETHER AS ABLE.  Copyright  VHI. All rights reserved.   Feet Apart (Compliant Surface) Head Motion - Eyes Open    With eyes open, standing on compliant surface: __pillow____, feet shoulder width apart, move head slowly: up and down 5 times.  Rest a few seconds.  Then do side to side head motion 5 times.  Do __1-2__ sessions per day.  Copyright  VHI. All rights reserved.       Electronically signed by Mervyn Gay, PT at 05/23/2017 11:04 AM    Habituation - Tip Card  1.The goal of habituation  training is to assist in decreasing symptoms of vertigo, dizziness, or nausea provoked by specific head and body motions. 2.These exercises may initially increase symptoms; however, be persistent and work through symptoms. With repetition and time, the exercises will assist in reducing or eliminating symptoms. 3.Exercises should be stopped and discussed with the therapist if you experience any of the following: - Sudden change or fluctuation in hearing - New onset of ringing in the ears, or increase in current intensity - Any fluid discharge from the ear - Severe pain in neck or back - Extreme nausea  Copyright  VHI. All rights reserved.   Habituation - Rolling   With pillow under head, start on back. Roll to your right side.  Hold until dizziness stops, plus 20 seconds and then roll to the left side.  Hold until dizziness stops, plus 20 seconds.  Repeat sequence 5 times per session. Do 2 sessions per day.  Copyright  VHI. All rights reserved.

## 2017-05-30 NOTE — Therapy (Signed)
[facility] [address] [place], [place], [address] Phone: [phone]   Fax:  [phone]  Physical Therapy Treatment  Patient Details  Name: Melanie Ray MRN: [id number] Date of Birth: 03/21/1951 Referring Provider: Heide Spark, MD  Encounter Date: 05/30/2017      PT End of Session - 05/30/17 1321    Visit Number 3   Number of Visits 6   Date for PT Re-Evaluation 06/20/17   Authorization Type G code every 10th visit   PT Start Time 1318   PT Stop Time 1400   PT Time Calculation (min) 42 min   Activity Tolerance Patient tolerated treatment well   Behavior During Therapy [facility] for tasks assessed/performed      Past Medical History:  Diagnosis Date  . Anxiety   . CAD (coronary artery disease) 04/11/2016   S/p ant STEMI 5/17: LHC >> LAD proximal 80%, mid 80%, distal 50%, ostial D1 60%; LCx with LPDA lesion 30%; RCA Mild calcification with no significant stenosis in a medium caliber, nondominant RCA; LVEF is estimated at 45% with inferoapical and lateral wall akinesis >> PCI: PCI: 3.5 x 24 mm Promus DES to prox LAD, 2.5 x 12 mm Promus DES to mid LAD.   [name] Chronic systolic CHF (congestive heart failure) ([place]) 03/21/2016   Echo 01/30/17: Diff HK, mild focal basal septal hypertrophy, EF 30-35, mild AI, MAC, mild MR // Echo 06/08/16: Mild focal basal septal hypertrophy, EF 25-30%, diff HK, ant-septal AK, Gr 1 DD, mild AI, MAC, mild MR, PASP 37 mmHg // Echo 03/18/16: EF 30-35%, ant-septal AK, Gr 1 DD, mild MR, severe LAE.  [name] Depression   . Diabetes mellitus type 2 in obese ([place])   . Glaucoma   . History of acute anterior wall MI 03/17/2016  . Hyperlipemia   . Hypertension   . Ischemic cardiomyopathy 10/02/2016   Refused ICD  . Left bundle branch block     Past Surgical History:  Procedure Laterality Date  . APPENDECTOMY    . CARDIAC CATHETERIZATION N/A 03/17/2016   Procedure: Left Heart Cath and Coronary Angiography;   Surgeon: Sherren Mocha, MD;  Location: [place] [facility];  Service: Cardiovascular;  Laterality: N/A;  . CARDIAC CATHETERIZATION N/A 03/17/2016   Procedure: Coronary Stent Intervention;  Surgeon: Sherren Mocha, MD;  Location: [place] [facility];  Service: Cardiovascular;  Laterality: N/A;  . SMALL BOWEL REPAIR    . TONSILLECTOMY    . UTERINE FIBROID SURGERY      There were no vitals filed for this visit.      Subjective Assessment - 05/30/17 1319    Subjective The patient reports she went to [facility] yesterday and had a bout of vertigo.  She describes difficulty seeing with blurred vision and drank water and it improved.  She noted some mild unsteadiness with onset that cleared.    Pertinent History CHF, ischemic cardiomyopathy, diabetes.     Patient Stated Goals Reduce dizziness.   Currently in Pain? No/denies                Vestibular Assessment - 05/30/17 1322      Vestibular Assessment   General Observation Baseline dizziness today is 4/10     Orthostatics   BP supine (x 5 minutes) 149/73   HR supine (x 5 minutes) 69   BP standing (after 1 minute) 152/74  reports of mild dizziness and head pressure   HR standing (after 1 minute) 76   BP  standing (after 3 minutes) 160/72   HR standing (after 3 minutes) 71                 [facility] Adult PT Treatment/Exercise - 05/30/17 1332      Ambulation/Gait   Ambulation/Gait Yes   Ambulation/Gait Assistance 7: Independent   Ambulation Distance (Feet) 400 Feet   Assistive device None   Ambulation Surface Level;Indoor   Gait Comments Gait with vertical ball toss and horizontal ball toss.      Neuro Re-ed    Neuro Re-ed Details  Standing ball toss in place R and L sides for lateral movement, single limb stance near support surface, tandem stance near corner for support, tandem gait.  Rocker board standing with wall bumps, head motion horizontal and vertical planes x 5 reps each, eyes closed standing on rocker board with  CGA for safety.          Vestibular Treatment/Exercise - 05/30/17 1322      Vestibular Treatment/Exercise   Vestibular Treatment Provided Habituation;Gaze   Habituation Exercises Seated Horizontal Head Turns;Standing Horizontal Head Turns;Standing Vertical Head Turns;Standing Diagonal Head Turns;Brandt Daroff;Horizontal Roll   Gaze Exercises X1 Viewing Horizontal     Melanie Ray   Number of Reps  2   Symptom Description  Dizzy with return to sitting from both sides.      Horizontal Roll   Number of Reps  3   Symptom Description  Symptoms present with rolling R and L sides, no nystagmus viewed in room light.  Lightheaded sensation when returns to sitting.     Seated Horizontal Head Turns   Number of Reps  5   Symptom Description  increases from 4/10 up to 5/10     Standing Horizontal Head Turns   Number of Reps  5   Symptom Description  on foam with feet apart     Standing Vertical Head Turns   Number of Reps  5   Symptom Description  on foam with feet apart     Standing Diagonal Head Turns   Number of Reps  5   Symptiom Description  on foam with feet apart     X1 Viewing Horizontal   Foot Position standing   Comments symptoms increased from 4/10 up to 6/10 with horizontal and vertical VOR               PT Education - 05/30/17 1411    Education provided Yes   Education Details HEP: added habituation HEP   Person(s) Educated Patient   Methods Demonstration;Explanation;Handout   Comprehension Verbalized understanding;Returned demonstration          PT Short Term Goals - 05/30/17 1331      PT SHORT TERM GOAL #1   Title The patient will be indep with HEP for gaze adaptation, habituation, and high level balance. TARGET DATE ALL STGS 06/06/17   Time 4   Period Weeks     PT SHORT TERM GOAL #2   Title The patient will tolerate x 1 viewing x 30 seconds with change in dizziness from baseline < 2/10 (increased at eval from 3-4/10 up to 8/10)   Time 4   Period  Weeks     PT SHORT TERM GOAL #3   Title The patient will tolerate head motion horiz and vertical x 5 reps with < or equal to 2/10 change from baseline.    Baseline Met on 05/30/17   Time 4   Period Weeks   Status  Achieved     PT SHORT TERM GOAL #4   Title Further assess gait speed and SOT and write goals to follow for LTGs.   Baseline Met on 05/23/17 scoring 2.78 ft/sec gait speed and 40% SOT score.   Time 4   Period Weeks   Status Achieved           PT Long Term Goals - 05/24/17 1220      PT LONG TERM GOAL #1   Title The patient will reduce dizziness handicap index from 44% to < or equal to 25% to demo dec'd self perception of dizziness.  TARGET DATE ALL LTGS  06/20/17   Time 6   Period Weeks     PT LONG TERM GOAL #2   Title The patient will tolerate all bed mobility sit<>supine and sit<>sidelying, and rolling without any c/o subjective dizziness.   Time 6   Period Weeks     PT LONG TERM GOAL #3   Title LTGs for gait and SOT to follow, as indicated.   Time 6   Period Weeks   Status Achieved     PT LONG TERM GOAL #4   Title Improve gait speed from 2.64f/sec to > or equal to 3.1 ft/sec to demo improved gait/mobility.   Time 6   Period Weeks   Status New     PT LONG TERM GOAL #5   Title The patient will improve SOT from 40% equilibrium score up to > or equal to 55% to demo improving multi-sensory balance use.   Time 6   Period Weeks               Plan - 05/30/17 1411    Clinical Impression Statement The patient has met 2 STGs.  PT continuing to progress HEP to include motion sensitivity treatment.  Checked orthostatic measurements today.  Continue working towards [employer] LTGs.    PT Treatment/Interventions ADLs/Self Care Home Management;Canalith Repostioning;Neuromuscular re-education;Balance training;Therapeutic exercise;Therapeutic activities;Manual techniques;Patient/family education;Gait training;Functional mobility training;Vestibular   PT Next Visit Plan  Check HEP, outdoor gait activities, progress gaze   Consulted and Agree with Plan of Care Patient      Patient will benefit from skilled therapeutic intervention in order to improve the following deficits and impairments:  Abnormal gait, Difficulty walking, Dizziness, Decreased balance, Impaired vision/preception  Visit Diagnosis: Dizziness and giddiness  Other abnormalities of gait and mobility  Unsteadiness on feet     Problem List Patient Active Problem List   Diagnosis Date Noted  . Great toe pain, left 05/17/2017  . Hot flashes 04/23/2017  . Dizziness 01/03/2017  . Ischemic cardiomyopathy 10/02/2016  . CAD (coronary artery disease) 04/11/2016  . Chronic systolic CHF (congestive heart failure) ([place] 03/21/2016  . History of acute anterior wall MI 03/18/2016  . DM type 2 (diabetes mellitus, type 2) ([place] 08/03/2015  . Essential hypertension 07/28/2015  . HLD (hyperlipidemia) 07/28/2015  . Glaucoma 07/28/2015  . Depression 07/28/2015  . Anxiety 07/28/2015  . Insomnia 07/28/2015  . Obesity (BMI 30.0-34.9) 07/28/2015    Lillis Nuttle, PT 05/30/2017, 2:14 PM  [address]SWeb[address] [address]Phone: [phone]  Fax:  [phone] Name: GModelle VollmerMRN: [id number] of Birth: 603/03/1951

## 2017-05-31 ENCOUNTER — Ambulatory Visit (INDEPENDENT_AMBULATORY_CARE_PROVIDER_SITE_OTHER): Payer: Medicare Other | Admitting: Neurology

## 2017-05-31 DIAGNOSIS — G4733 Obstructive sleep apnea (adult) (pediatric): Secondary | ICD-10-CM | POA: Diagnosis not present

## 2017-05-31 DIAGNOSIS — G4720 Circadian rhythm sleep disorder, unspecified type: Secondary | ICD-10-CM

## 2017-06-03 ENCOUNTER — Ambulatory Visit (INDEPENDENT_AMBULATORY_CARE_PROVIDER_SITE_OTHER): Payer: Medicare Other | Admitting: Family Medicine

## 2017-06-03 ENCOUNTER — Other Ambulatory Visit (INDEPENDENT_AMBULATORY_CARE_PROVIDER_SITE_OTHER): Payer: Self-pay | Admitting: Family Medicine

## 2017-06-03 ENCOUNTER — Encounter (INDEPENDENT_AMBULATORY_CARE_PROVIDER_SITE_OTHER): Payer: Self-pay | Admitting: Family Medicine

## 2017-06-03 VITALS — BP 137/76 | HR 78 | Temp 97.9°F | Resp 22 | Ht 63.0 in | Wt 177.0 lb

## 2017-06-03 DIAGNOSIS — Z1389 Encounter for screening for other disorder: Secondary | ICD-10-CM

## 2017-06-03 DIAGNOSIS — E559 Vitamin D deficiency, unspecified: Secondary | ICD-10-CM

## 2017-06-03 DIAGNOSIS — Z0289 Encounter for other administrative examinations: Secondary | ICD-10-CM

## 2017-06-03 DIAGNOSIS — Z6831 Body mass index (BMI) 31.0-31.9, adult: Secondary | ICD-10-CM

## 2017-06-03 DIAGNOSIS — E669 Obesity, unspecified: Secondary | ICD-10-CM | POA: Diagnosis not present

## 2017-06-03 DIAGNOSIS — E119 Type 2 diabetes mellitus without complications: Secondary | ICD-10-CM

## 2017-06-03 DIAGNOSIS — I5022 Chronic systolic (congestive) heart failure: Secondary | ICD-10-CM | POA: Diagnosis not present

## 2017-06-03 DIAGNOSIS — R0602 Shortness of breath: Secondary | ICD-10-CM

## 2017-06-03 DIAGNOSIS — R5383 Other fatigue: Secondary | ICD-10-CM | POA: Diagnosis not present

## 2017-06-03 DIAGNOSIS — E538 Deficiency of other specified B group vitamins: Secondary | ICD-10-CM | POA: Diagnosis not present

## 2017-06-03 DIAGNOSIS — Z1331 Encounter for screening for depression: Secondary | ICD-10-CM

## 2017-06-03 NOTE — Procedures (Signed)
PATIENT'S NAME:  Melanie Ray, Schwimmer DOB:      Mar 15, 1951      MR#:    [id number]     DATE OF RECORDING: 05/31/2017 REFERRING M.D.:  Dr. Sarina Ill,     PCP: Thersa Salt, DO Study Performed:  Split-Night Titration Study HISTORY: 66 year old woman with a history of coronary artery disease with status post STEMI, chronic systolic congestive heart failure, ischemic cardiomyopathy, left bundle branch block, glaucoma, depression, type 2 diabetes, hyperlipidemia, hypertension, dizziness and obesity, who reports snoring and excessive daytime somnolence. The patient endorsed the Epworth Sleepiness Scale at 10 points. The patient's weight 172 pounds with a height of 63 (inches), resulting in a BMI of 30.5 kg/m2. The patient's neck circumference measured 16 inches.  CURRENT MEDICATIONS: Aspirin, Lipitor, Coreg, Cosopt, Imdur, Ativan, Cozaar, Antivert, Glucophage, Nitrostat, Zofran, Brilinta, Desyrel, Effexor, Lasix, Aldactone   PROCEDURE:  This is a multichannel digital polysomnogram utilizing the Somnostar 11.2 system.  Electrodes and sensors were applied and monitored per AASM Specifications.   EEG, EOG, Chin and Limb EMG, were sampled at 200 Hz.  ECG, Snore and Nasal Pressure, Thermal Airflow, Respiratory Effort, CPAP Flow and Pressure, Oximetry was sampled at 50 Hz. Digital video and audio were recorded.      BASELINE STUDY WITHOUT CPAP RESULTS:  Lights Out was at 21:38 and Lights On at 04:55 for the night, split study started at 00:19, epoch 333.  Total recording time (TRT) was 176, with a total sleep time (TST) of 122.5 minutes.   The patient's sleep latency was 37.5 minutes, which is delayed. REM sleep was absent.  The sleep efficiency was 69.6 %.    SLEEP ARCHITECTURE: WASO (Wake after sleep onset) was 5.5 minutes, Stage N1 was 4 minutes, Stage N2 was 118.5 minutes, Stage N3 was 0 minutes and Stage R (REM sleep) was 0 minutes.  The percentages were Stage N1 3.3%, Stage N2 96.7%, Stage N3 and Stage R  (REM sleep) were absent. The arousals were noted as: 15 were spontaneous, 0 were associated with PLMs, 120 were associated with respirat0ory events.   Audio and video analysis did not show any abnormal or unusual movements, behaviors, phonations or vocalizations with the exception, that patient yelled out around 00:19 and had an episode of vomiting.   The patient took 1 bathroom break for the night. Snoring was noted, mostly moderate to loud. The EKG was in keeping with normal sinus rhythm (NSR).  RESPIRATORY ANALYSIS:  There were a total of 182 respiratory events:  153 obstructive apneas, 1 central apneas and 0 mixed apneas with a total of 154 apneas and an apnea index (AI) of 75.4. There were 28 hypopneas with a hypopnea index of 13.7. The patient also had 0 respiratory event related arousals (RERAs).  Snoring was noted.     The total APNEA/HYPOPNEA INDEX (AHI) was 89.1 /hour and the total RESPIRATORY DISTURBANCE INDEX was 89.1 /hour.  0 events occurred in REM sleep and 57 events in NREM. The REM AHI was 0, /hour versus a non-REM AHI of 89.1 /hour. The patient spent 19 minutes sleep time in the supine position 338 minutes in non-supine. The supine AHI was 0.0 /hour versus a non-supine AHI of 89.1 /hour.  OXYGEN SATURATION & C02:  The wake baseline 02 saturation was 96%, with the lowest being 87% during NREM sleep. Time spent below 89% saturation equaled 2 minutes.  PERIODIC LIMB MOVEMENTS: The patient had a total of 0 Periodic Limb Movements.  The Periodic Limb Movement (PLM)  index was 0 /hour and the PLM Arousal index was 0 /hour.   TITRATION STUDY WITH CPAP RESULTS:   The patient was fitted with a medium Eson nasal mask. CPAP was initiated at 5 cmH20 with heated humidity per AASM split night standards and pressure was advanced to 8 cmH20 because of hypopneas, apneas and desaturations. At a PAP pressure of 8 cmH20, there was a reduction of the AHI to 0/hour, with non-supine REM sleep achieved and  O2 nadir of 93%.   Total recording time (TRT) was 262 minutes, with a total sleep time (TST) of 234.5 minutes. The patient's sleep latency was 27 minutes. REM latency was 124.5 minutes.  The sleep efficiency was 89.5 %.    SLEEP ARCHITECTURE: Wake after sleep was 0.5 minutes, Stage N1 0.5 minutes, Stage N2 62.5 minutes, Stage N3 88 minutes and Stage R (REM sleep) 83.5 minutes. The percentages were: Stage N1 .2%, Stage N2 26.7%, Stage N3 37.5% which is increased, and Stage R (REM sleep) 35.6%, which is increased and in keeping with rebound.  The arousals were noted as: 7 were spontaneous, 0 were associated with PLMs, 0 were associated with respiratory events.  RESPIRATORY ANALYSIS:  There were a total of 6 respiratory events: 3 obstructive apneas, 0 central apneas and 0 mixed apneas with a total of 3 apneas and an apnea index (AI) of .8. There were 3 hypopneas with a hypopnea index of .8 /hour. The patient also had 0 respiratory event related arousals (RERAs).      The total APNEA/HYPOPNEA INDEX  (AHI) was 1.5 /hour and the total RESPIRATORY DISTURBANCE INDEX was 1.5 /hour.  2 events occurred in REM sleep and 4 events in NREM. The REM AHI was 1.4 /hour versus a non-REM AHI of 1.6 /hour. REM sleep was achieved on a pressure of  cm/h2o (AHI was  .) The patient spent 8% of total sleep time in the supine position. The supine AHI was 6.3 /hour, versus a non-supine AHI of 1.1/hour.  OXYGEN SATURATION & C02:  The wake baseline 02 saturation was 96%, with the lowest being 90%. Time spent below 89% saturation equaled 0 minutes.  PERIODIC LIMB MOVEMENTS:    The patient had a total of 0 Periodic Limb Movements. The Periodic Limb Movement (PLM) index was 0 /hour and the PLM Arousal index was 0 /hour.   Post-study, the patient indicated that sleep was better than usual.  POLYSOMNOGRAPHY IMPRESSION :   1. Obstructive Sleep Apnea (OSA)  2. Dysfunctions associated with sleep stages or arousals from  sleep  RECOMMENDATIONS:  1. This patient has severe obstructive sleep apnea and responded well on CPAP therapy. Of note, the absence of REM sleep during the baseline portion of the study likely underestimate her AHI and O2 nadir. I start the patient on home CPAP treatment at a pressure of 8 cm via medium nasal mask with heated humidity. The patient should be reminded to be fully compliant with PAP therapy to improve sleep related symptoms and decrease long term cardiovascular risks. Please note that untreated obstructive sleep apnea carries additional perioperative morbidity. Patients with significant obstructive sleep apnea should receive perioperative PAP therapy and the surgeons and particularly the anesthesiologist should be informed of the diagnosis and the severity of the sleep disordered breathing. 2. This study shows sleep fragmentation and abnormal sleep stage percentages; these are nonspecific findings and per se do not signify an intrinsic sleep disorder or a cause for the patient's sleep-related symptoms. Causes include (but are  not limited to) the first night effect of the sleep study, circadian rhythm disturbances, medication effect or an underlying mood disorder or medical problem.  3. The patient should be cautioned not to drive, work at heights, or operate dangerous or heavy equipment when tired or sleepy. Review and reiteration of good sleep hygiene measures should be pursued with any patient. 4. The patient will be seen in follow-up by Dr. Rexene Alberts at [facility] for discussion of the test results and further management strategies. The referring provider will be notified of the test results.  I certify that I have reviewed the entire raw data recording prior to the issuance of this report in accordance with the Standards of Accreditation of the American Academy of Sleep Medicine (AASM)   Star Age, MD, PhD Diplomat, American Board of Psychiatry and Neurology (Neurology and Sleep  Medicine)

## 2017-06-03 NOTE — Addendum Note (Signed)
Addended by: Star Age on: 06/03/2017 06:36 PM   Modules accepted: Orders

## 2017-06-03 NOTE — Progress Notes (Signed)
  Patient referred by Dr. Jaynee Eagles, seen by me on 05/20/17, split study on 05/31/17. Please call and notify patient that the recent sleep study confirmed the diagnosis of severe OSA. She did well with CPAP during the study with significant improvement of the respiratory events. Therefore, I would like start the patient on CPAP therapy at home by prescribing a machine for home use. I placed the order in the chart. The patient will need a follow up appointment with me or CM or MM in 10 weeks post set up that has to be scheduled; please go ahead and schedule while you have the patient on the phone and make sure patient understands the importance of keeping this window for the FU appointment, as it is often an insurance requirement and failing to adhere to this may result in losing coverage for sleep apnea treatment.  Please re-enforce the importance of compliance with treatment and the need for [name] to monitor compliance data - again an insurance requirement and good feedback for the patient as far as how they are doing.  Also remind patient, that any upcoming CPAP machine or mask issues, should be first addressed with the DME company. Please ask if patient has a preference regarding DME company.  Please arrange for CPAP set up at home through a DME company of patient's choice - once you have spoken to the patient - and faxed/routed report to PCP and referring MD (if other than PCP), you can close this encounter, thanks,   Star Age, MD, PhD [facility] ([place])

## 2017-06-03 NOTE — Progress Notes (Signed)
Office: [phone]  /  Fax: [phone]   Dear Dr. Jaynee Eagles,   Thank you for referring Melanie Ray to our clinic. The following note includes my evaluation and treatment recommendations.  HPI:   Chief Complaint: OBESITY    Melanie Ray has been referred by Berta Minor B. Jaynee Eagles, MD for consultation regarding her obesity and obesity related comorbidities.    Melanie Ray (MR# [id number]) is a 66 y.o. female who presents on 06/03/2017 for obesity evaluation and treatment. Current BMI is Body mass index is 31.35 kg/m.[name] Melanie Ray has been struggling with her weight for many years and has been unsuccessful in either losing weight, maintaining weight loss, or reaching her healthy weight goal.     Melanie Ray attended our information session and states she is currently in the action stage of change and ready to dedicate time achieving and maintaining a healthier weight. Melanie Ray is interested in becoming our patient and working on intensive lifestyle modifications including (but not limited to) diet, exercise and weight loss.    Melanie Ray states her family eats meals together she thinks her family will eat healthier with  her she struggles with family and or coworkers weight loss sabotage her desired weight loss is 26 lbs she started gaining weight 7 yrs ago her heaviest weight ever was 184 lbs. she has significant food cravings issues  she snacks frequently in the evenings she skips meals frequently she is frequently drinking liquids with calories she frequently makes poor food choices she frequently eats larger portions than normal  she has binge eating behaviors she struggles with emotional eating    Fatigue Kayann feels her energy is lower than it should be. This has worsened with weight gain and has not worsened recently. Icesis admits to daytime somnolence and  admits to waking up still tired. Patient is at risk for obstructive sleep apnea. Patent has a history of symptoms of daytime  fatigue and morning fatigue. Patient generally gets 6 hours of sleep per night, and states they generally have restless sleep. Snoring is present. Apneic episodes are present. Epworth Sleepiness Score is 7  Dyspnea on exertion Melanie Ray notes increasing shortness of breath with exercising and seems to be worsening over time with weight gain. She notes getting out of breath sooner with activity than she used to. This has not gotten worse recently. Melanie Ray denies orthopnea.  Vitamin D deficiency Melanie Ray has a diagnosis of vitamin D deficiency. She is not currently taking vit D and she admits fatigue but denies nausea, vomiting or muscle weakness. Melanie Ray is at risk for osteoporosis.  Diabetes II Melanie Ray has a diagnosis of diabetes type II and is currently on metformin. Melanie Ray does not have blood sugar log today and denies any hypoglycemic episodes. She does admit polyphagia and her last A1c was at 6.5 She is attempting to work on intensive lifestyle modifications including diet, exercise, and weight loss to help control her blood glucose levels.  Congestive Heart Failure Melanie Ray has systolic dysfunction with ejection fraction of 30 to 35% in 2018 and is status post MI with left bundle branch block. She is trying to decrease sodium. Ailynn denies chest pain on lasix and denies orthopnea or shortness of breath at rest.  B12 Deficiency Melanie Ray has a diagnosis of B12 insufficiency and notes fatigue. She is on metformin and is at a higher risk for B12 deficiency. This is a new diagnosis. Melanie Ray is not a vegetarian and does have a previous diagnosis of anemia. She does not have a history of weight  loss surgery.   Depression Screen Melanie Ray's Food and Mood (modified PHQ-9) score was  Depression screen PHQ 2/9 06/03/2017  Decreased Interest 2  Down, Depressed, Hopeless 2  PHQ - 2 Score 4  Altered sleeping 2  Tired, decreased energy 2  Change in appetite 1  Feeling bad or failure about yourself  2  Trouble  concentrating 0  Moving slowly or fidgety/restless 0  Suicidal thoughts 0  PHQ-9 Score 11    ALLERGIES: Allergies  Allergen Reactions  . Meperidine Nausea And Vomiting  . Tetracycline Swelling    MEDICATIONS: Current Outpatient Prescriptions on File Prior to Visit  Medication Sig Dispense Refill  . aspirin EC 81 MG EC tablet Take 1 tablet (81 mg total) by mouth daily.    [name] atorvastatin (LIPITOR) 80 MG tablet Take 1 tablet (80 mg total) by mouth daily. 90 tablet 3  . carvedilol (COREG) 12.5 MG tablet Take 1 tablet (12.5 mg total) by mouth as directed. 1 tablet in the AM and 1 and 1/2 tabs in the PM x 1 week; then increase to 1 1/2 tabs twice daily (Patient taking differently: Take 12.5 mg by mouth daily. 1 tablet in the AM and 1 and 1/2 tabs in the PM x 1 week; then increase to 1 1/2 tabs twice daily) 180 tablet 3  . dorzolamide-timolol (COSOPT) 22.3-6.8 MG/ML ophthalmic solution Place 1 drop into both eyes 2 (two) times daily.     . isosorbide mononitrate (IMDUR) 30 MG 24 hr tablet Take 1 tablet (30 mg total) by mouth daily. 30 tablet 11  . LORazepam (ATIVAN) 0.5 MG tablet Take 1 tablet (0.5 mg total) by mouth at bedtime. Pt has supply 30 tablet 0  . losartan (COZAAR) 50 MG tablet Take 50 mg by mouth daily.    . meclizine (ANTIVERT) 25 MG tablet Take 1 tablet (25 mg total) by mouth 3 (three) times daily as needed for dizziness. 30 tablet 0  . metFORMIN (GLUCOPHAGE) 500 MG tablet TAKE ONE TABLET BY MOUTH TWICE DAILY WITH MEALS 180 tablet 3  . nitroGLYCERIN (NITROSTAT) 0.4 MG SL tablet Place 1 tablet (0.4 mg total) under the tongue every 5 (five) minutes x 3 doses as needed for chest pain (Do not exceed 3 doses). 25 tablet 2  . ondansetron (ZOFRAN) 4 MG tablet Take 1 tablet (4 mg total) by mouth every 8 (eight) hours as needed for nausea or vomiting. 20 tablet 0  . traZODone (DESYREL) 100 MG tablet Take 1 tablet (100 mg total) by mouth at bedtime as needed for sleep. Pt has supply 30 tablet  1  . venlafaxine XR (EFFEXOR-XR) 150 MG 24 hr capsule Take 1 capsule (150 mg total) by mouth daily. 30 capsule 2  . allopurinol (ZYLOPRIM) 100 MG tablet Take 1 tablet (100 mg total) by mouth daily. (Patient not taking: Reported on 06/03/2017) 90 tablet 1  . colchicine 0.6 MG tablet Take 1 tablet (0.6 mg total) by mouth daily. (Patient not taking: Reported on 06/03/2017) 90 tablet 1  . furosemide (LASIX) 40 MG tablet Take 1 tablet (40 mg total) by mouth as directed. 40 mg on Mon, Wed and Fri's; all other days 20 mg 90 tablet 3  . spironolactone (ALDACTONE) 25 MG tablet Take 0.5 tablets (12.5 mg total) by mouth daily. 45 tablet 11  . ticagrelor (BRILINTA) 90 MG TABS tablet Take 1 tablet (90 mg total) by mouth 2 (two) times daily. (Patient not taking: Reported on 06/03/2017) 60 tablet 11   No  current facility-administered medications on file prior to visit.     PAST MEDICAL HISTORY: Past Medical History:  Diagnosis Date  . Anemia   . Anxiety   . Back pain   . CAD (coronary artery disease) 04/11/2016   S/p ant STEMI 5/17: LHC >> LAD proximal 80%, mid 80%, distal 50%, ostial D1 60%; LCx with LPDA lesion 30%; RCA Mild calcification with no significant stenosis in a medium caliber, nondominant RCA; LVEF is estimated at 45% with inferoapical and lateral wall akinesis >> PCI: PCI: 3.5 x 24 mm Promus DES to prox LAD, 2.5 x 12 mm Promus DES to mid LAD.   [name] Chest pain   . Chronic systolic CHF (congestive heart failure) ([place]) 03/21/2016   Echo 01/30/17: Diff HK, mild focal basal septal hypertrophy, EF 30-35, mild AI, MAC, mild MR // Echo 06/08/16: Mild focal basal septal hypertrophy, EF 25-30%, diff HK, ant-septal AK, Gr 1 DD, mild AI, MAC, mild MR, PASP 37 mmHg // Echo 03/18/16: EF 30-35%, ant-septal AK, Gr 1 DD, mild MR, severe LAE.  [name] Constipation   . Depression   . Diabetes mellitus type 2 in obese ([place])   . Dizziness   . Glaucoma   . Gout   . Heart disease   . Heartburn   . History of acute anterior wall  MI 03/17/2016  . History of heart attack   . Hyperlipemia   . Hypertension   . Ischemic cardiomyopathy 10/02/2016   Refused ICD  . Joint pain   . Left bundle branch block   . Nausea   . Sleep apnea   . SOB (shortness of breath)   . Swelling    feet or legs    PAST SURGICAL HISTORY: Past Surgical History:  Procedure Laterality Date  . APPENDECTOMY    . CARDIAC CATHETERIZATION N/A 03/17/2016   Procedure: Left Heart Cath and Coronary Angiography;  Surgeon: Sherren Mocha, MD;  Location: [facility];  Service: Cardiovascular;  Laterality: N/A;  . CARDIAC CATHETERIZATION N/A 03/17/2016   Procedure: Coronary Stent Intervention;  Surgeon: Sherren Mocha, MD;  Location: [facility];  Service: Cardiovascular;  Laterality: N/A;  . SMALL BOWEL REPAIR    . TONSILLECTOMY    . UTERINE FIBROID SURGERY      SOCIAL HISTORY: Social History  Substance Use Topics  . Smoking status: Former Smoker    Types: Cigarettes    Quit date: 04/13/1997  . Smokeless tobacco: Never Used  . Alcohol use 0.6 oz/week    1 Glasses of wine per week     Comment: 2-3 glasses of wine per month    FAMILY HISTORY: Family History  Problem Relation Age of Onset  . Anxiety disorder Mother   . Paranoid behavior Mother   . Hypertension Mother   . Heart failure Mother   . Stroke Mother   . Dementia Mother   . High Cholesterol Mother   . Diabetes Mother   . Hyperlipidemia Mother   . Heart disease Mother   . Depression Mother   . Hypertension Father   . High Cholesterol Father   . Mood Disorder Sister   . Stroke Sister   . Anxiety disorder Maternal Aunt   . Drug abuse Cousin   . Tuberculosis Paternal Grandfather     ROS: Review of Systems  Constitutional: Positive for malaise/fatigue.  HENT: Positive for hearing loss.        Nasal Discharge Dentures  Eyes:  Vision Changes Wear Glasses or Contacts Blurry or Double Vision  Respiratory: Positive for shortness of breath (with  activity).        Negative shortness of breath at rest  Cardiovascular: Negative for chest pain and orthopnea.  Gastrointestinal: Positive for heartburn and nausea. Negative for vomiting.  Genitourinary: Positive for frequency.  Musculoskeletal: Positive for back pain.       Negative muscle weakness  Neurological: Positive for dizziness.  Endo/Heme/Allergies: Bruises/bleeds easily.  Psychiatric/Behavioral: Positive for depression. The patient is nervous/anxious (nervousness) and has insomnia.     PHYSICAL EXAM: Blood pressure 137/76, pulse 78, temperature 97.9 F (36.6 C), resp. rate (!) 22, height _0  (1.6 m), weight 177 lb (80.3 kg), SpO2 96 %. Body mass index is 31.35 kg/m. Physical Exam  Constitutional: She is oriented to person, place, and time. She appears well-developed and well-nourished.  Cardiovascular: Normal rate.   Pulmonary/Chest: Effort normal.  Musculoskeletal: Normal range of motion.  Neurological: She is oriented to person, place, and time.  Skin: Skin is warm and dry.  Psychiatric: She has a normal mood and affect. Her behavior is normal.  Vitals reviewed.   RECENT LABS AND TESTS: BMET    Component Value Date/Time   NA 140 03/21/2017 1022   K 4.2 03/21/2017 1022   CL 101 03/21/2017 1022   CO2 19 03/21/2017 1022   GLUCOSE 163 (H) 03/21/2017 1022   GLUCOSE 107 (H) 01/03/2017 0947   BUN 19 03/21/2017 1022   CREATININE 1.02 (H) 03/21/2017 1022   CREATININE 1.12 (H) 10/16/2016 1412   CALCIUM 9.8 03/21/2017 1022   GFRNONAA 57 (L) 03/21/2017 1022   GFRAA 66 03/21/2017 1022   Lab Results  Component Value Date   HGBA1C 6.5 01/03/2017   No results found for: INSULIN CBC    Component Value Date/Time   WBC 6.8 01/03/2017 0947   RBC 4.52 01/03/2017 0947   HGB 12.2 01/03/2017 0947   HCT 36.9 01/03/2017 0947   PLT 387.0 01/03/2017 0947   MCV 81.7 01/03/2017 0947   MCH 27.1 11/22/2016 1138   MCHC 33.0 01/03/2017 0947   RDW 15.2 01/03/2017 0947    Iron/TIBC/Ferritin/ %Sat No results found for: IRON, TIBC, FERRITIN, IRONPCTSAT Lipid Panel     Component Value Date/Time   CHOL 124 03/21/2017 1022   TRIG 114 03/21/2017 1022   HDL 36 (L) 03/21/2017 1022   CHOLHDL 3.4 03/21/2017 1022   CHOLHDL 4 01/03/2017 0947   VLDL 22.6 01/03/2017 0947   LDLCALC 65 03/21/2017 1022   Hepatic Function Panel     Component Value Date/Time   PROT 7.1 03/21/2017 1022   ALBUMIN 4.3 03/21/2017 1022   AST 16 03/21/2017 1022   ALT 19 03/21/2017 1022   ALKPHOS 139 (H) 03/21/2017 1022   BILITOT 0.2 03/21/2017 1022      Component Value Date/Time   TSH 1.02 01/03/2017 0947   TSH 0.589 03/18/2016 0158    ECG  shows NSR with a rate of 73 BPM INDIRECT CALORIMETER done today shows a VO2 of 138 and a REE of 956.  Her calculated basal metabolic rate is 9163 thus her basal metabolic rate is worse than expected.    ASSESSMENT AND PLAN: Other fatigue - Plan: EKG 12-Lead, CBC With Differential, T3, T4, free, TSH  Shortness of breath on exertion  Type 2 diabetes mellitus without complication, without long-term current use of insulin (HCC) - Plan: Comprehensive metabolic panel, Hemoglobin A1c, Insulin, random, Microalbumin / creatinine urine ratio  Chronic systolic congestive heart failure (HCC)  Vitamin D deficiency - Plan: VITAMIN D 25 Hydroxy (Vit-D Deficiency, Fractures)  Vitamin B12 deficiency - Plan: Vitamin B12  Depression screening  Class 1 obesity with serious comorbidity and body mass index (BMI) of 31.0 to 31.9 in adult, unspecified obesity type  PLAN: Fatigue Natalyah was informed that her fatigue may be related to obesity, depression or many other causes. Labs will be ordered, and in the meanwhile Chanya has agreed to work on diet, exercise and weight loss to help with fatigue. Proper sleep hygiene was discussed including the need for 7-8 hours of quality sleep each night. A sleep study was not ordered based on symptoms and Epworth  score.  Dyspnea on exertion Melanie Ray's shortness of breath appears to be obesity related and exercise induced. She has agreed to work on weight loss and gradually increase exercise to treat her exercise induced shortness of breath. If Elisama follows our instructions and loses weight without improvement of her shortness of breath, we will plan to refer to pulmonology. We will monitor this condition regularly. Melanie Ray agrees to this plan.  Vitamin D Deficiency Melanie Ray was informed that low vitamin D levels contributes to fatigue and are associated with obesity, breast, and colon cancer. We will check labs and will follow up for routine testing of vitamin D, at least 2-3 times per year. She agrees to follow up with our clinic in 2 weeks.  Diabetes II Melanie Ray has been given extensive diabetes education by myself today including ideal fasting and post-prandial blood glucose readings, individual ideal Hgb A1c goals  and hypoglycemia prevention. We discussed the importance of good blood sugar control to decrease the likelihood of diabetic complications such as nephropathy, neuropathy, limb loss, blindness, coronary artery disease, and death. We discussed the importance of intensive lifestyle modification including diet, exercise and weight loss as the first line treatment for diabetes. We will check labs and Melanie Ray agrees to continue her diabetes medications, diet, exercise and weight loss and will follow up at the agreed upon time.  Congestive Heart Failure Anari agrees to continue with diet and exercise and will decrease sodium in her diet. Additional education was given today on lower sodium foods. Melanie Ray agrees to follow up with our clinic in 2 weeks.  B12 Deficiency Melanie Ray will work on increasing B12 rich foods in her diet. B12 supplementation was not prescribed today. We will check labs and Melanie Ray agrees to follow up with our clinic in 2 weeks.  Depression Screen Melanie Ray had a moderately positive  depression screening. Depression is commonly associated with obesity and often results in emotional eating behaviors. We will monitor this closely and work on CBT to help improve the non-hunger eating patterns. Referral to Psychology may be required if no improvement is seen as she continues in our clinic.  Obesity Melanie Ray is currently in the action stage of change and her goal is to continue with weight loss efforts. I recommend Avian begin the structured treatment plan as follows:  She has agreed to follow the Category 1 plan +100 calories Janann has been instructed to eventually work up to a goal of 150 minutes of combined cardio and strengthening exercise per week for weight loss and overall health benefits. We discussed the following Behavioral Modification Strategies today: increasing lean protein intake, no skipping meals and meal planning & cooking strategies   She was informed of the importance of frequent follow up visits to maximize her success with intensive lifestyle modifications for her multiple  health conditions. She was informed we would discuss her lab results at her next visit unless there is a critical issue that needs to be addressed sooner. Aahna agreed to keep her next visit at the agreed upon time to discuss these results.  I, Doreene Nest, am acting as transcriptionist for Dennard Nip, MD  I have reviewed the above documentation for accuracy and completeness, and I agree with the above. -Dennard Nip, MD    OBESITY BEHAVIORAL INTERVENTION VISIT  Today's visit was # 1 out of 76.  Starting weight: 177 lbs Starting date: 06/03/17 Today's weight : 177 lbs Today's date: 06/03/2017 Total lbs lost to date: 0 (Patients must lose 7 lbs in the first 6 months to continue with counseling)   ASK: We discussed the diagnosis of obesity with Mahlon Gammon today and Alauna agreed to give [name] permission to discuss obesity behavioral modification therapy  today.  ASSESS: Elan has the diagnosis of obesity and her BMI today is 31.4 Alila is in the action stage of change   ADVISE: Nekayla was educated on the multiple health risks of obesity as well as the benefit of weight loss to improve her health. She was advised of the need for long term treatment and the importance of lifestyle modifications.  AGREE: Multiple dietary modification options and treatment options were discussed and  Valerye agreed to follow the Category 1 plan +100 calories We discussed the following Behavioral Modification Strategies today: increasing lean protein intake, no skipping meals and meal planning & cooking strategies

## 2017-06-04 ENCOUNTER — Telehealth: Payer: Self-pay

## 2017-06-04 NOTE — Telephone Encounter (Signed)
I called pt to discuss her sleep study results. No answer, left a message asking her to call me back. 

## 2017-06-04 NOTE — Telephone Encounter (Signed)
-----   Message from Star Age, MD sent at 06/03/2017  6:36 PM EDT -----  Patient referred by Dr. Jaynee Eagles, seen by me on 05/20/17, split study on 05/31/17. Please call and notify patient that the recent sleep study confirmed the diagnosis of severe OSA. She did well with CPAP during the study with significant improvement of the respiratory events. Therefore, I would like start the patient on CPAP therapy at home by prescribing a machine for home use. I placed the order in the chart. The patient will need a follow up appointment with me or CM or MM in 10 weeks post set up that has to be scheduled; please go ahead and schedule while you have the patient on the phone and make sure patient understands the importance of keeping this window for the FU appointment, as it is often an insurance requirement and failing to adhere to this may result in losing coverage for sleep apnea treatment.  Please re-enforce the importance of compliance with treatment and the need for [name] to monitor compliance data - again an insurance requirement and good feedback for the patient as far as how they are doing.  Also remind patient, that any upcoming CPAP machine or mask issues, should be first addressed with the DME company. Please ask if patient has a preference regarding DME company.  Please arrange for CPAP set up at home through a DME company of patient's choice - once you have spoken to the patient - and faxed/routed report to PCP and referring MD (if other than PCP), you can close this encounter, thanks,   Star Age, MD, PhD [facility] ([place])

## 2017-06-05 ENCOUNTER — Encounter (INDEPENDENT_AMBULATORY_CARE_PROVIDER_SITE_OTHER): Payer: Self-pay | Admitting: Dietician

## 2017-06-05 ENCOUNTER — Telehealth (INDEPENDENT_AMBULATORY_CARE_PROVIDER_SITE_OTHER): Payer: Self-pay | Admitting: Family Medicine

## 2017-06-05 LAB — T4, FREE: Free T4: 1.08 ng/dL (ref 0.82–1.77)

## 2017-06-05 LAB — INSULIN, RANDOM: INSULIN: 37 u[IU]/mL — ABNORMAL HIGH (ref 2.6–24.9)

## 2017-06-05 LAB — COMPREHENSIVE METABOLIC PANEL
A/G RATIO: 1.6 (ref 1.2–2.2)
ALT: 13 IU/L (ref 0–32)
AST: 17 IU/L (ref 0–40)
Albumin: 4.4 g/dL (ref 3.6–4.8)
Alkaline Phosphatase: 126 IU/L — ABNORMAL HIGH (ref 39–117)
BUN / CREAT RATIO: 25 (ref 12–28)
BUN: 20 mg/dL (ref 8–27)
CO2: 20 mmol/L (ref 20–29)
Calcium: 10.1 mg/dL (ref 8.7–10.3)
Chloride: 104 mmol/L (ref 96–106)
Creatinine, Ser: 0.8 mg/dL (ref 0.57–1.00)
GFR, EST AFRICAN AMERICAN: 88 mL/min/{1.73_m2} (ref >59)
GFR, EST NON AFRICAN AMERICAN: 77 mL/min/{1.73_m2} (ref >59)
GLUCOSE: 78 mg/dL (ref 65–99)
Globulin, Total: 2.8 g/dL (ref 1.5–4.5)
Potassium: 4.7 mmol/L (ref 3.5–5.2)
SODIUM: 140 mmol/L (ref 134–144)
Total Protein: 7.2 g/dL (ref 6.0–8.5)

## 2017-06-05 LAB — CBC WITH DIFFERENTIAL
Basophils Absolute: 0 10*3/uL (ref 0.0–0.2)
Basos: 0 %
EOS (ABSOLUTE): 0.1 10*3/uL (ref 0.0–0.4)
Eos: 2 %
Hematocrit: 33.7 % — ABNORMAL LOW (ref 34.0–46.6)
Hemoglobin: 10.9 g/dL — ABNORMAL LOW (ref 11.1–15.9)
Immature Grans (Abs): 0 10*3/uL (ref 0.0–0.1)
Immature Granulocytes: 0 %
Lymphocytes Absolute: 2.1 10*3/uL (ref 0.7–3.1)
Lymphs: 31 %
MCH: 27.2 pg (ref 26.6–33.0)
MCHC: 32.3 g/dL (ref 31.5–35.7)
MCV: 84 fL (ref 79–97)
MONOS ABS: 0.5 10*3/uL (ref 0.1–0.9)
Monocytes: 8 %
NEUTROS ABS: 4 10*3/uL (ref 1.4–7.0)
NEUTROS PCT: 59 %
RBC: 4.01 x10E6/uL (ref 3.77–5.28)
RDW: 14.5 % (ref 12.3–15.4)
WBC: 6.8 10*3/uL (ref 3.4–10.8)

## 2017-06-05 LAB — HEMOGLOBIN A1C
Est. average glucose Bld gHb Est-mCnc: 128 mg/dL
Hgb A1c MFr Bld: 6.1 % — ABNORMAL HIGH (ref 4.8–5.6)

## 2017-06-05 LAB — T3: T3 TOTAL: 117 ng/dL (ref 71–180)

## 2017-06-05 LAB — VITAMIN B12: Vitamin B-12: 647 pg/mL (ref 232–1245)

## 2017-06-05 LAB — TSH: TSH: 0.934 u[IU]/mL (ref 0.450–4.500)

## 2017-06-05 LAB — VITAMIN D 25 HYDROXY (VIT D DEFICIENCY, FRACTURES): Vit D, 25-Hydroxy: 20.9 ng/mL — ABNORMAL LOW (ref 30.0–100.0)

## 2017-06-05 NOTE — Telephone Encounter (Signed)
I called pt. I advised pt that Dr. Rexene Alberts reviewed their sleep study results and found that pt has severe osa but did well with her cpap during the study. Dr. Rexene Alberts recommends that pt start a cpap at home. I reviewed PAP compliance expectations with the pt. Pt is agreeable to starting a CPAP. I advised pt that an order will be sent to a DME, [facility], and [facility] will call the pt within about one week after they file with the pt's insurance. [facility] will show the pt how to use the machine, fit for masks, and troubleshoot the CPAP if needed. A follow up appt was made for insurance purposes with Dr. Rexene Alberts on 08/13/17 at 3:00pm. Pt verbalized understanding to arrive 15 minutes early and bring their CPAP. A letter with all of this information in it will be mailed to the pt as a reminder. I verified with the pt that the address we have on file is correct. Pt verbalized understanding of results. Pt had no questions at this time but was encouraged to call back if questions arise.

## 2017-06-05 NOTE — Telephone Encounter (Signed)
The patient advised via mychart. April, [place]

## 2017-06-05 NOTE — Telephone Encounter (Signed)
Patient wants to know if  she can eat breaded shrimp and fish and also 45 cal honey wheat bread. Ph [phone] can leave voice mail with answer to her question.

## 2017-06-06 ENCOUNTER — Ambulatory Visit: Payer: Medicare Other | Attending: Neurology | Admitting: Rehabilitative and Restorative Service Providers"

## 2017-06-06 DIAGNOSIS — R2681 Unsteadiness on feet: Secondary | ICD-10-CM | POA: Diagnosis not present

## 2017-06-06 DIAGNOSIS — R2689 Other abnormalities of gait and mobility: Secondary | ICD-10-CM

## 2017-06-06 DIAGNOSIS — R42 Dizziness and giddiness: Secondary | ICD-10-CM | POA: Diagnosis not present

## 2017-06-06 NOTE — Therapy (Addendum)
[facility] [address] [place], [place], [address] Phone: [phone]   Fax:  [phone]  Physical Therapy Treatment and Discharge summary  Patient Details  Name: Melanie Ray MRN: [id number] Date of Birth: 1951/02/24 Referring Provider: Heide Spark, MD  Encounter Date: 06/06/2017      PT End of Session - 06/06/17 1317    Visit Number 4   Number of Visits 6   Date for PT Re-Evaluation 06/20/17   Authorization Type G code every 10th visit   PT Start Time 1320   PT Stop Time 1350   PT Time Calculation (min) 30 min   Activity Tolerance Patient tolerated treatment well   Behavior During Therapy [facility] for tasks assessed/performed      Past Medical History:  Diagnosis Date  . Anemia   . Anxiety   . Back pain   . CAD (coronary artery disease) 04/11/2016   S/p ant STEMI 5/17: LHC >> LAD proximal 80%, mid 80%, distal 50%, ostial D1 60%; LCx with LPDA lesion 30%; RCA Mild calcification with no significant stenosis in a medium caliber, nondominant RCA; LVEF is estimated at 45% with inferoapical and lateral wall akinesis >> PCI: PCI: 3.5 x 24 mm Promus DES to prox LAD, 2.5 x 12 mm Promus DES to mid LAD.   [name] Chest pain   . Chronic systolic CHF (congestive heart failure) ([place]) 03/21/2016   Echo 01/30/17: Diff HK, mild focal basal septal hypertrophy, EF 30-35, mild AI, MAC, mild MR // Echo 06/08/16: Mild focal basal septal hypertrophy, EF 25-30%, diff HK, ant-septal AK, Gr 1 DD, mild AI, MAC, mild MR, PASP 37 mmHg // Echo 03/18/16: EF 30-35%, ant-septal AK, Gr 1 DD, mild MR, severe LAE.  [name] Constipation   . Depression   . Diabetes mellitus type 2 in obese ([place])   . Dizziness   . Glaucoma   . Gout   . Heart disease   . Heartburn   . History of acute anterior wall MI 03/17/2016  . History of heart attack   . Hyperlipemia   . Hypertension   . Ischemic cardiomyopathy 10/02/2016   Refused ICD  . Joint pain   . Left bundle  branch block   . Nausea   . Sleep apnea   . SOB (shortness of breath)   . Swelling    feet or legs    Past Surgical History:  Procedure Laterality Date  . APPENDECTOMY    . CARDIAC CATHETERIZATION N/A 03/17/2016   Procedure: Left Heart Cath and Coronary Angiography;  Surgeon: Sherren Mocha, MD;  Location: [facility];  Service: Cardiovascular;  Laterality: N/A;  . CARDIAC CATHETERIZATION N/A 03/17/2016   Procedure: Coronary Stent Intervention;  Surgeon: Sherren Mocha, MD;  Location: [facility];  Service: Cardiovascular;  Laterality: N/A;  . SMALL BOWEL REPAIR    . TONSILLECTOMY    . UTERINE FIBROID SURGERY      There were no vitals filed for this visit.      Subjective Assessment - 06/06/17 1320    Subjective The patient reports she has been doing well overall.  She notes she got dizzy when taking her shower today and bending to get items out of a drawer.  She feels this improves with eating fruit (thinks the sugar helps).    Pertinent History CHF, ischemic cardiomyopathy, diabetes.     Patient Stated Goals Reduce dizziness.   Currently in Pain? No/denies  Vestibular Assessment - 06/06/17 1324      Vestibular Assessment   General Observation The patient notes dizziness worse today rating it a 5/10.  Was doing better, but today it got worse.     Symptom Behavior   Type of Dizziness Spinning   Duration of Dizziness seconds   Aggravating Factors --  bending     Sidelying Right   Sidelying Right Duration Notes spinning sensation x seconds   Sidelying Right Symptoms Upbeat, right rotatory nystagmus     Sidelying Left   Sidelying Left Duration mild increase in dizziness   Sidelying Left Symptoms No nystagmus                 [facility] Adult PT Treatment/Exercise - 06/06/17 1357      Ambulation/Gait   Ambulation/Gait Yes   Gait Comments PT accompanied patient to walk her to the car as she felt increased dizziness/unsteadiness  after Epley's manuever.  Patinet's husband present and notes he will be able to accompany her into the house to ensure patient is safe.          Vestibular Treatment/Exercise - 06/06/17 1327      Vestibular Treatment/Exercise   Vestibular Treatment Provided Canalith Repositioning   Canalith Repositioning Epley Manuever Right   Habituation Exercises --      EPLEY MANUEVER RIGHT   Number of Reps  2   Response Details  Provokes nausea and let patient rest x 4-5 minutes after first Epley's.  Repeated and the patient felt initially symptoms improve, then reported she did not feel well and wanted to go home.  PT took patient's BP and it was 144/62.       [name]   Number of Reps  --                 PT Short Term Goals - 05/30/17 1331      PT SHORT TERM GOAL #1   Title The patient will be indep with HEP for gaze adaptation, habituation, and high level balance. TARGET DATE ALL STGS 06/06/17   Time 4   Period Weeks     PT SHORT TERM GOAL #2   Title The patient will tolerate x 1 viewing x 30 seconds with change in dizziness from baseline < 2/10 (increased at eval from 3-4/10 up to 8/10)   Time 4   Period Weeks     PT SHORT TERM GOAL #3   Title The patient will tolerate head motion horiz and vertical x 5 reps with < or equal to 2/10 change from baseline.    Baseline Met on 05/30/17   Time 4   Period Weeks   Status Achieved     PT SHORT TERM GOAL #4   Title Further assess gait speed and SOT and write goals to follow for LTGs.   Baseline Met on 05/23/17 scoring 2.78 ft/sec gait speed and 40% SOT score.   Time 4   Period Weeks   Status Achieved           PT Long Term Goals - 05/24/17 1220      PT LONG TERM GOAL #1   Title The patient will reduce dizziness handicap index from 44% to < or equal to 25% to demo dec'd self perception of dizziness.  TARGET DATE ALL LTGS  06/20/17   Time 6   Period Weeks     PT LONG TERM GOAL #2   Title The patient will tolerate all  bed  mobility sit<>supine and sit<>sidelying, and rolling without any c/o subjective dizziness.   Time 6   Period Weeks     PT LONG TERM GOAL #3   Title LTGs for gait and SOT to follow, as indicated.   Time 6   Period Weeks   Status Achieved     PT LONG TERM GOAL #4   Title Improve gait speed from 2.93f/sec to > or equal to 3.1 ft/sec to demo improved gait/mobility.   Time 6   Period Weeks   Status New     PT LONG TERM GOAL #5   Title The patient will improve SOT from 40% equilibrium score up to > or equal to 55% to demo improving multi-sensory balance use.   Time 6   Period Weeks               Plan - 06/06/17 1359    Clinical Impression Statement The patient arrived today reporting increased symptoms today of dizziness, spinning, and nausea.  She had a positive R sidelying test for upbeating, right rotary nystagmus visible x 10-15 seconds in room light (one first rep).  Performed Epley's x 2 with patient noting increased nausea, imbalance.  She requested to end session.  Her husband was able to accompany her hme to ensure she is safe.   PT Treatment/Interventions ADLs/Self Care Home Management;Canalith Repostioning;Neuromuscular re-education;Balance training;Therapeutic exercise;Therapeutic activities;Manual techniques;Patient/family education;Gait training;Functional mobility training;Vestibular   PT Next Visit Plan Check R posterior canal with sidelying test, treat with brandt daroff, check prior HEP.   Consulted and Agree with Plan of Care Patient      Patient will benefit from skilled therapeutic intervention in order to improve the following deficits and impairments:  Abnormal gait, Difficulty walking, Dizziness, Decreased balance, Impaired vision/preception  Visit Diagnosis: Dizziness and giddiness  Other abnormalities of gait and mobility  Unsteadiness on feet    PHYSICAL THERAPY DISCHARGE SUMMARY  Visits from Start of Care: 4  Current functional level  related to goals / functional outcomes: See above   Remaining deficits: At last session, patient presented with BPPV.  PT treated to patient tolerance.  Patient called to cancel remaining visits after that due to travel.   Education / Equipment: Home program.  Plan: Patient agrees to discharge.  Patient goals were not met. Patient is being discharged due to not returning since the last visit.  ?????        Thank you for the referral of this patient. CRudell Cobb MPT   [facility] 06/06/2017, 2:01 PM  [id number] [address]Phone: [phone]  Fax:  [phone] Name: GTekila CaillouetMRN: [id number] of Birth: [birth year]

## 2017-06-08 LAB — MICROALBUMIN / CREATININE URINE RATIO
CREATININE, UR: 99.6 mg/dL
MICROALB/CREAT RATIO: 4.5 mg/g{creat} (ref 0.0–30.0)
Microalbumin, Urine: 4.5 ug/mL

## 2017-06-14 ENCOUNTER — Encounter: Payer: Self-pay | Admitting: Rehabilitative and Restorative Service Providers"

## 2017-06-17 ENCOUNTER — Telehealth: Payer: Self-pay

## 2017-06-17 ENCOUNTER — Ambulatory Visit (INDEPENDENT_AMBULATORY_CARE_PROVIDER_SITE_OTHER): Payer: Medicare Other | Admitting: Family Medicine

## 2017-06-17 ENCOUNTER — Other Ambulatory Visit: Payer: Self-pay | Admitting: Psychiatry

## 2017-06-17 ENCOUNTER — Other Ambulatory Visit (HOSPITAL_COMMUNITY): Payer: Self-pay | Admitting: Psychiatry

## 2017-06-17 VITALS — BP 124/67 | HR 74 | Temp 97.9°F | Ht 63.0 in | Wt 167.0 lb

## 2017-06-17 DIAGNOSIS — E669 Obesity, unspecified: Secondary | ICD-10-CM

## 2017-06-17 DIAGNOSIS — E559 Vitamin D deficiency, unspecified: Secondary | ICD-10-CM

## 2017-06-17 DIAGNOSIS — F331 Major depressive disorder, recurrent, moderate: Secondary | ICD-10-CM

## 2017-06-17 DIAGNOSIS — D649 Anemia, unspecified: Secondary | ICD-10-CM | POA: Insufficient documentation

## 2017-06-17 DIAGNOSIS — E119 Type 2 diabetes mellitus without complications: Secondary | ICD-10-CM | POA: Diagnosis not present

## 2017-06-17 DIAGNOSIS — D508 Other iron deficiency anemias: Secondary | ICD-10-CM

## 2017-06-17 DIAGNOSIS — Z6830 Body mass index (BMI) 30.0-30.9, adult: Secondary | ICD-10-CM

## 2017-06-17 HISTORY — DX: Obesity, unspecified: E66.9

## 2017-06-17 MED ORDER — FERROUS SULFATE 325 (65 FE) MG PO TABS: 325.0000 mg | ORAL_TABLET | Freq: Every day | ORAL | 0 refills | Status: DC

## 2017-06-17 MED ORDER — VITAMIN D (ERGOCALCIFEROL) 1.25 MG (50000 UNIT) PO CAPS: 50000.0000 [IU] | ORAL_CAPSULE | ORAL | 0 refills | Status: DC

## 2017-06-17 MED ORDER — VENLAFAXINE HCL ER 150 MG PO CP24: 150.0000 mg | ORAL_CAPSULE | Freq: Every day | ORAL | 0 refills | Status: DC

## 2017-06-17 NOTE — Telephone Encounter (Signed)
Pt called left message that dr. Modesta Messing refilled effexor for one month.

## 2017-06-17 NOTE — Telephone Encounter (Signed)
pt called states that she only has 2 pills left. pt states she needs refills on her effexor pt was last seen in may but made an appt to see dr. Gretel Acre for 07-02-17

## 2017-06-17 NOTE — Telephone Encounter (Signed)
Ordered effexor 150 mg daily for a month.

## 2017-06-17 NOTE — Progress Notes (Signed)
Office: [phone]  /  Fax: [phone]   HPI:   Chief Complaint: OBESITY Melanie Ray is here to discuss her progress with her obesity treatment plan. She is on the Category 1 plan +100 calories and is following her eating plan approximately 99 % of the time. She states she is exercising 0 minutes 0 times per week. Melanie Ray has done very well with weight loss on the category 1 plan. Hunger was mostly controlled and she did well with planning ahead. Melanie Ray would like more fruit options. Her weight is 167 lb (75.8 kg) today and has had a weight loss of 10 pounds over a period of 2 weeks since her last visit. She has lost 10 lbs since starting treatment with [name].  Vitamin D deficiency Melanie Ray has a diagnosis of vitamin D deficiency with mildly elevated alkaline phosphatase and she has a personal history of osteoporosis, not recently treated . She is not currently taking vit D and denies nausea, vomiting or muscle weakness.  Diabetes II Melanie Ray has a diagnosis of diabetes type II. [name] states she is not checking BGs at home and denies any hypoglycemic episodes. Last A1c was at 6.1. [name] is doing well on metformin. She has been working on intensive lifestyle modifications including diet, exercise, and weight loss to help control her blood glucose levels.  Iron Deficiency Anemia Melanie Ray has a positive history of iron deficiency anemia. Her Hgb is low today and she denies rectal bleeding. She is due for a colonoscopy. She is not on iron supplementation currently.   ALLERGIES: Allergies  Allergen Reactions  . Meperidine Nausea And Vomiting  . Tetracycline Swelling    MEDICATIONS: Current Outpatient Prescriptions on File Prior to Visit  Medication Sig Dispense Refill  . allopurinol (ZYLOPRIM) 100 MG tablet Take 1 tablet (100 mg total) by mouth daily. 90 tablet 1  . aspirin EC 81 MG EC tablet Take 1 tablet (81 mg total) by mouth daily.    Melanie Ray atorvastatin (LIPITOR) 80 MG tablet Take 1 tablet (80  mg total) by mouth daily. 90 tablet 3  . carvedilol (COREG) 12.5 MG tablet Take 1 tablet (12.5 mg total) by mouth as directed. 1 tablet in the AM and 1 and 1/2 tabs in the PM x 1 week; then increase to 1 1/2 tabs twice daily (Patient taking differently: Take 12.5 mg by mouth daily. 1 tablet in the AM and 1 and 1/2 tabs in the PM x 1 week; then increase to 1 1/2 tabs twice daily) 180 tablet 3  . colchicine 0.6 MG tablet Take 1 tablet (0.6 mg total) by mouth daily. 90 tablet 1  . dorzolamide-timolol (COSOPT) 22.3-6.8 MG/ML ophthalmic solution Place 1 drop into both eyes 2 (two) times daily.     . isosorbide mononitrate (IMDUR) 30 MG 24 hr tablet Take 1 tablet (30 mg total) by mouth daily. 30 tablet 11  . LORazepam (ATIVAN) 0.5 MG tablet Take 1 tablet (0.5 mg total) by mouth at bedtime. Pt has supply 30 tablet 0  . losartan (COZAAR) 50 MG tablet Take 50 mg by mouth daily.    . meclizine (ANTIVERT) 25 MG tablet Take 1 tablet (25 mg total) by mouth 3 (three) times daily as needed for dizziness. 30 tablet 0  . metFORMIN (GLUCOPHAGE) 500 MG tablet TAKE ONE TABLET BY MOUTH TWICE DAILY WITH MEALS 180 tablet 3  . nitroGLYCERIN (NITROSTAT) 0.4 MG SL tablet Place 1 tablet (0.4 mg total) under the tongue every 5 (five) minutes x 3 doses  as needed for chest pain (Do not exceed 3 doses). 25 tablet 2  . ondansetron (ZOFRAN) 4 MG tablet Take 1 tablet (4 mg total) by mouth every 8 (eight) hours as needed for nausea or vomiting. 20 tablet 0  . ticagrelor (BRILINTA) 90 MG TABS tablet Take 1 tablet (90 mg total) by mouth 2 (two) times daily. 60 tablet 11  . traZODone (DESYREL) 100 MG tablet Take 1 tablet (100 mg total) by mouth at bedtime as needed for sleep. Pt has supply 30 tablet 1  . furosemide (LASIX) 40 MG tablet Take 1 tablet (40 mg total) by mouth as directed. 40 mg on Mon, Wed and Fri's; all other days 20 mg 90 tablet 3  . spironolactone (ALDACTONE) 25 MG tablet Take 0.5 tablets (12.5 mg total) by mouth daily. 45  tablet 11   No current facility-administered medications on file prior to visit.     PAST MEDICAL HISTORY: Past Medical History:  Diagnosis Date  . Anemia   . Anxiety   . Back pain   . CAD (coronary artery disease) 04/11/2016   S/p ant STEMI 5/17: LHC >> LAD proximal 80%, mid 80%, distal 50%, ostial D1 60%; LCx with LPDA lesion 30%; RCA Mild calcification with no significant stenosis in a medium caliber, nondominant RCA; LVEF is estimated at 45% with inferoapical and lateral wall akinesis >> PCI: PCI: 3.5 x 24 mm Promus DES to prox LAD, 2.5 x 12 mm Promus DES to mid LAD.   Melanie Ray Chest pain   . Chronic systolic CHF (congestive heart failure) ([place]) 03/21/2016   Echo 01/30/17: Diff HK, mild focal basal septal hypertrophy, EF 30-35, mild AI, MAC, mild MR // Echo 06/08/16: Mild focal basal septal hypertrophy, EF 25-30%, diff HK, ant-septal AK, Gr 1 DD, mild AI, MAC, mild MR, PASP 37 mmHg // Echo 03/18/16: EF 30-35%, ant-septal AK, Gr 1 DD, mild MR, severe LAE.  Melanie Ray Constipation   . Depression   . Diabetes mellitus type 2 in obese ([place])   . Dizziness   . Glaucoma   . Gout   . Heart disease   . Heartburn   . History of acute anterior wall MI 03/17/2016  . History of heart attack   . Hyperlipemia   . Hypertension   . Ischemic cardiomyopathy 10/02/2016   Refused ICD  . Joint pain   . Left bundle branch block   . Nausea   . Sleep apnea   . SOB (shortness of breath)   . Swelling    feet or legs    PAST SURGICAL HISTORY: Past Surgical History:  Procedure Laterality Date  . APPENDECTOMY    . CARDIAC CATHETERIZATION N/A 03/17/2016   Procedure: Left Heart Cath and Coronary Angiography;  Surgeon: Melanie Mocha, MD;  Location: [facility];  Service: Cardiovascular;  Laterality: N/A;  . CARDIAC CATHETERIZATION N/A 03/17/2016   Procedure: Coronary Stent Intervention;  Surgeon: Melanie Mocha, MD;  Location: [facility];  Service: Cardiovascular;  Laterality: N/A;  . SMALL BOWEL REPAIR      . TONSILLECTOMY    . UTERINE FIBROID SURGERY      SOCIAL HISTORY: Social History  Substance Use Topics  . Smoking status: Former Smoker    Types: Cigarettes    Quit date: 04/13/1997  . Smokeless tobacco: Never Used  . Alcohol use 0.6 oz/week    1 Glasses of wine per week     Comment: 2-3 glasses of wine per month  FAMILY HISTORY: Family History  Problem Relation Age of Onset  . Anxiety disorder Mother   . Paranoid behavior Mother   . Hypertension Mother   . Heart failure Mother   . Stroke Mother   . Dementia Mother   . High Cholesterol Mother   . Diabetes Mother   . Hyperlipidemia Mother   . Heart disease Mother   . Depression Mother   . Hypertension Father   . High Cholesterol Father   . Mood Disorder Sister   . Stroke Sister   . Anxiety disorder Maternal Aunt   . Drug abuse Cousin   . Tuberculosis Paternal Grandfather     ROS: Review of Systems  Constitutional: Positive for weight loss.  Gastrointestinal: Negative for nausea and vomiting.       Negative rectal bleeding  Musculoskeletal:       Negative muscle weakness  Endo/Heme/Allergies:       Negative hypoglycemia    PHYSICAL EXAM: Blood pressure 124/67, pulse 74, temperature 97.9 F (36.6 C), temperature source Oral, height 5' 3"  (1.6 m), weight 167 lb (75.8 kg), SpO2 98 %. Body mass index is 29.58 kg/m. Physical Exam  Constitutional: She is oriented to person, place, and time. She appears well-developed and well-nourished.  Cardiovascular: Normal rate.   Pulmonary/Chest: Effort normal.  Musculoskeletal: Normal range of motion.  Neurological: She is oriented to person, place, and time.  Skin: Skin is warm and dry.  Psychiatric: She has a normal mood and affect. Her behavior is normal.  Vitals reviewed.   RECENT LABS AND TESTS: BMET    Component Value Date/Time   NA 140 06/03/2017 1036   K 4.7 06/03/2017 1036   CL 104 06/03/2017 1036   CO2 20 06/03/2017 1036   GLUCOSE 78 06/03/2017 1036    GLUCOSE 107 (H) 01/03/2017 0947   BUN 20 06/03/2017 1036   CREATININE 0.80 06/03/2017 1036   CREATININE 1.12 (H) 10/16/2016 1412   CALCIUM 10.1 06/03/2017 1036   GFRNONAA 77 06/03/2017 1036   GFRAA 88 06/03/2017 1036   Lab Results  Component Value Date   HGBA1C 6.1 (H) 06/03/2017   HGBA1C 6.5 01/03/2017   HGBA1C 6.2 (H) 03/18/2016   HGBA1C 6.8 (H) 07/28/2015   Lab Results  Component Value Date   INSULIN 37.0 (H) 06/03/2017   CBC    Component Value Date/Time   WBC 6.8 06/03/2017 1036   WBC 6.8 01/03/2017 0947   RBC 4.01 06/03/2017 1036   RBC 4.52 01/03/2017 0947   HGB 10.9 (L) 06/03/2017 1036   HCT 33.7 (L) 06/03/2017 1036   PLT 387.0 01/03/2017 0947   MCV 84 06/03/2017 1036   MCH 27.2 06/03/2017 1036   MCH 27.1 11/22/2016 1138   MCHC 32.3 06/03/2017 1036   MCHC 33.0 01/03/2017 0947   RDW 14.5 06/03/2017 1036   LYMPHSABS 2.1 06/03/2017 1036   EOSABS 0.1 06/03/2017 1036   BASOSABS 0.0 06/03/2017 1036   Iron/TIBC/Ferritin/ %Sat No results found for: IRON, TIBC, FERRITIN, IRONPCTSAT Lipid Panel     Component Value Date/Time   CHOL 124 03/21/2017 1022   TRIG 114 03/21/2017 1022   HDL 36 (L) 03/21/2017 1022   CHOLHDL 3.4 03/21/2017 1022   CHOLHDL 4 01/03/2017 0947   VLDL 22.6 01/03/2017 0947   LDLCALC 65 03/21/2017 1022   Hepatic Function Panel     Component Value Date/Time   PROT 7.2 06/03/2017 1036   ALBUMIN 4.4 06/03/2017 1036   AST 17 06/03/2017 1036   ALT  13 06/03/2017 1036   ALKPHOS 126 (H) 06/03/2017 1036   BILITOT <0.2 06/03/2017 1036      Component Value Date/Time   TSH 0.934 06/03/2017 1036   TSH 1.02 01/03/2017 0947   TSH 0.589 03/18/2016 0158    ASSESSMENT AND PLAN: Vitamin D deficiency - Plan: Vitamin D, Ergocalciferol, (DRISDOL) 50000 units CAPS capsule  Other iron deficiency anemia - Plan: ferrous sulfate 325 (65 FE) MG tablet  Type 2 diabetes mellitus without complication, without long-term current use of insulin (H[facility])  Class 1  obesity with serious comorbidity and body mass index (BMI) of 30.0 to 30.9 in adult, unspecified obesity type - Pt started with BMI greater than 30  PLAN:  Vitamin D Deficiency Camay was informed that low vitamin D levels contributes to fatigue and are associated with obesity, breast, and colon cancer. She agrees to continue to take prescription Vit D @50 ,000 IU every week and will follow up for routine testing of vitamin D, at least 2-3 times per year. She was informed of the risk of over-replacement of vitamin D and agrees to not increase her dose unless he discusses this with [name] first.  Diabetes II Jalaina has been given extensive diabetes education by myself today including ideal fasting and post-prandial blood glucose readings, individual ideal Hgb A1c goals  and hypoglycemia prevention. We discussed the importance of good blood sugar control to decrease the likelihood of diabetic complications such as nephropathy, neuropathy, limb loss, blindness, coronary artery disease, and death. We discussed the importance of intensive lifestyle modification including diet, exercise and weight loss as the first line treatment for diabetes. Megahn agrees to continue her diabetes medications and will follow up at the agreed upon time.  Iron Deficiency Anemia The diagnosis of Iron deficiency anemia was discussed with Peter Congo and was explained in detail. She was given suggestions of iron rich foods and she agreed to start iron FeSO4 325 mg qd #30 with no refills and will schedule colonoscopy. She agrees to follow up with our clinic in 2 weeks.   Obesity Kymberlee is currently in the action stage of change. As such, her goal is to continue with weight loss efforts She has agreed to follow the Category 1 plan +100 calories Zuleika has been instructed to work up to a goal of 150 minutes of combined cardio and strengthening exercise per week for weight loss and overall health benefits. We discussed the following  Behavioral Modification Strategies today: increasing lean protein intake and decreasing simple carbohydrates   Oral has agreed to follow up with our clinic in 2 weeks. She was informed of the importance of frequent follow up visits to maximize her success with intensive lifestyle modifications for her multiple health conditions.  I, Doreene Nest, am acting as transcriptionist for Dennard Nip, MD  I have reviewed the above documentation for accuracy and completeness, and I agree with the above. -Dennard Nip, MD   OBESITY BEHAVIORAL INTERVENTION VISIT  Today's visit was # 2 out of 37.  Starting weight: 177 lbs Starting date: 06/03/17 Today's weight : 167 lbs Today's date: 06/17/2017 Total lbs lost to date: 10 (Patients must lose 7 lbs in the first 6 months to continue with counseling)   ASK: We discussed the diagnosis of obesity with Mahlon Gammon today and Lynnetta agreed to give [name] permission to discuss obesity behavioral modification therapy today.  ASSESS: Findley has the diagnosis of obesity and her BMI today is 29.6 Lassie is in the action stage of change  ADVISE: Kelcie was educated on the multiple health risks of obesity as well as the benefit of weight loss to improve her health. She was advised of the need for long term treatment and the importance of lifestyle modifications.  AGREE: Multiple dietary modification options and treatment options were discussed and  Clorissa agreed to follow the Category 1 plan +100 calories We discussed the following Behavioral Modification Strategies today: increasing lean protein intake and decreasing simple carbohydrates

## 2017-06-18 ENCOUNTER — Telehealth: Payer: Self-pay | Admitting: *Deleted

## 2017-06-18 ENCOUNTER — Encounter (INDEPENDENT_AMBULATORY_CARE_PROVIDER_SITE_OTHER): Payer: Medicare Other | Admitting: Family Medicine

## 2017-06-18 NOTE — Telephone Encounter (Signed)
Patient has requested orders to have a colonoscopy scheduled  Pt contact [phone]

## 2017-06-19 ENCOUNTER — Other Ambulatory Visit: Payer: Self-pay | Admitting: Family Medicine

## 2017-06-19 DIAGNOSIS — Z1211 Encounter for screening for malignant neoplasm of colon: Secondary | ICD-10-CM

## 2017-06-20 DIAGNOSIS — H35371 Puckering of macula, right eye: Secondary | ICD-10-CM | POA: Diagnosis not present

## 2017-06-24 ENCOUNTER — Telehealth: Payer: Self-pay | Admitting: Family Medicine

## 2017-06-24 NOTE — Telephone Encounter (Signed)
Pt called back looking for an update. Please advise, thank you!  Call pt @ [phone]

## 2017-06-24 NOTE — Telephone Encounter (Signed)
Pt called and stated that they received the letter that Dr. Lacinda Axon is leaving and that they would like to set up with Dr. Caryl Bis.

## 2017-06-24 NOTE — Telephone Encounter (Signed)
Patient advised and verbalized understanding 

## 2017-06-24 NOTE — Telephone Encounter (Signed)
Referral is in.

## 2017-07-02 ENCOUNTER — Ambulatory Visit (INDEPENDENT_AMBULATORY_CARE_PROVIDER_SITE_OTHER): Payer: Medicare Other | Admitting: Psychiatry

## 2017-07-02 ENCOUNTER — Telehealth: Payer: Self-pay | Admitting: Gastroenterology

## 2017-07-02 ENCOUNTER — Encounter: Payer: Self-pay | Admitting: Psychiatry

## 2017-07-02 ENCOUNTER — Ambulatory Visit (INDEPENDENT_AMBULATORY_CARE_PROVIDER_SITE_OTHER): Payer: Medicare Other | Admitting: Family Medicine

## 2017-07-02 VITALS — BP 132/69 | HR 80 | Temp 97.8°F | Wt 171.8 lb

## 2017-07-02 VITALS — BP 134/71 | HR 74 | Temp 98.1°F | Ht 63.0 in | Wt 168.0 lb

## 2017-07-02 DIAGNOSIS — E669 Obesity, unspecified: Secondary | ICD-10-CM

## 2017-07-02 DIAGNOSIS — F331 Major depressive disorder, recurrent, moderate: Secondary | ICD-10-CM

## 2017-07-02 DIAGNOSIS — Z6830 Body mass index (BMI) 30.0-30.9, adult: Secondary | ICD-10-CM | POA: Diagnosis not present

## 2017-07-02 DIAGNOSIS — I255 Ischemic cardiomyopathy: Secondary | ICD-10-CM | POA: Diagnosis not present

## 2017-07-02 DIAGNOSIS — F428 Other obsessive-compulsive disorder: Secondary | ICD-10-CM

## 2017-07-02 DIAGNOSIS — Z780 Asymptomatic menopausal state: Secondary | ICD-10-CM | POA: Diagnosis not present

## 2017-07-02 MED ORDER — VENLAFAXINE HCL ER 150 MG PO CP24: 150.0000 mg | ORAL_CAPSULE | Freq: Every day | ORAL | 3 refills | Status: DC

## 2017-07-02 NOTE — Progress Notes (Signed)
Patient ID: Melanie Ray, female   DOB: 1951-06-06, 66 y.o.   MRN: [id number] [facility] MD/PA/NP OP Progress Note  07/02/2017 10:01 AM Melanie Ray  MRN:  [id number]  Subjective:  Patient is a 66 year old female who presented for the follow-up appointment. . She reported that she was having some nightmares and flashbacks after the death of her sister-in-law but she was able to pray to God and was able to control her behavior. She reported that she is doing well on the Effexor and does not want to change her medications. She stated that she was tried on other medications in the past including Serzone. We discussed about adding a small dose of Seroquel but she is not receptive to changing her medications. She is planning to go to [place] with her husband over the long weekend. She appeared calm and alert during the interview. She denied having any suicidal ideations or plans.   She denied having any perceptual disturbances. She does not appear to have worsening of her symptoms at this time. She is compliant with her medications.        Chief Complaint:   Visit Diagnosis:     ICD-10-CM   1. Major depressive disorder, recurrent episode, moderate (HCC) F33.1 venlafaxine XR (EFFEXOR-XR) 150 MG 24 hr capsule  2. Other obsessive-compulsive disorders F42.8     Past Medical History:  Past Medical History:  Diagnosis Date  . Anemia   . Anxiety   . Back pain   . CAD (coronary artery disease) 04/11/2016   S/p ant STEMI 5/17: LHC >> LAD proximal 80%, mid 80%, distal 50%, ostial D1 60%; LCx with LPDA lesion 30%; RCA Mild calcification with no significant stenosis in a medium caliber, nondominant RCA; LVEF is estimated at 45% with inferoapical and lateral wall akinesis >> PCI: PCI: 3.5 x 24 mm Promus DES to prox LAD, 2.5 x 12 mm Promus DES to mid LAD.   [name] Chest pain   . Chronic systolic CHF (congestive heart failure) ([place]) 03/21/2016   Echo 01/30/17: Diff HK, mild focal basal septal hypertrophy,  EF 30-35, mild AI, MAC, mild MR // Echo 06/08/16: Mild focal basal septal hypertrophy, EF 25-30%, diff HK, ant-septal AK, Gr 1 DD, mild AI, MAC, mild MR, PASP 37 mmHg // Echo 03/18/16: EF 30-35%, ant-septal AK, Gr 1 DD, mild MR, severe LAE.  [name] Constipation   . Depression   . Diabetes mellitus type 2 in obese ([place])   . Dizziness   . Glaucoma   . Gout   . Heart disease   . Heartburn   . History of acute anterior wall MI 03/17/2016  . History of heart attack   . Hyperlipemia   . Hypertension   . Ischemic cardiomyopathy 10/02/2016   Refused ICD  . Joint pain   . Left bundle branch block   . Nausea   . Sleep apnea   . SOB (shortness of breath)   . Swelling    feet or legs    Past Surgical History:  Procedure Laterality Date  . APPENDECTOMY    . CARDIAC CATHETERIZATION N/A 03/17/2016   Procedure: Left Heart Cath and Coronary Angiography;  Surgeon: Sherren Mocha, MD;  Location: [place] [facility];  Service: Cardiovascular;  Laterality: N/A;  . CARDIAC CATHETERIZATION N/A 03/17/2016   Procedure: Coronary Stent Intervention;  Surgeon: Sherren Mocha, MD;  Location: [place] [facility];  Service: Cardiovascular;  Laterality: N/A;  . SMALL BOWEL REPAIR    . TONSILLECTOMY    .  UTERINE FIBROID SURGERY     Family History:  Family History  Problem Relation Age of Onset  . Anxiety disorder Mother   . Paranoid behavior Mother   . Hypertension Mother   . Heart failure Mother   . Stroke Mother   . Dementia Mother   . High Cholesterol Mother   . Diabetes Mother   . Hyperlipidemia Mother   . Heart disease Mother   . Depression Mother   . Hypertension Father   . High Cholesterol Father   . Mood Disorder Sister   . Stroke Sister   . Anxiety disorder Maternal Aunt   . Drug abuse Cousin   . Tuberculosis Paternal Grandfather    Social History:  Social History   Social History  . Marital status: Married    Spouse name: N/A  . Number of children: 0  . Years of education: BS in education    Occupational History  .  Retired Education officer, museum    Social History Main Topics  . Smoking status: Former Smoker    Types: Cigarettes    Quit date: 04/13/1997  . Smokeless tobacco: Never Used  . Alcohol use 0.6 oz/week    1 Glasses of wine per week     Comment: 2-3 glasses of wine per month  . Drug use: No  . Sexual activity: No   Other Topics Concern  . None   Social History Narrative   Lives in [place] with spouse.  No children.   Retired first [employer] for over 30 years ([place] for 10 years and then in [place] for over 20 years).   Left-handed      Additional History:   Trazodone Serzone Xanax Ativan Risperdal    Assessment:   Musculoskeletal: Strength & Muscle Tone: within normal limits Gait & Station: normal Patient leans: N/A  Psychiatric Specialty Exam: Medication Refill     Review of Systems  Psychiatric/Behavioral: Negative for depression, hallucinations, memory loss, substance abuse and suicidal ideas. The patient is nervous/anxious. The patient does not have insomnia.   All other systems reviewed and are negative.   Blood pressure 132/69, pulse 80, temperature 97.8 F (36.6 C), temperature source Oral, weight 171 lb 12.8 oz (77.9 kg).Body mass index is 30.43 kg/m.  General Appearance: Neat and Well Groomed  Eye Contact:  Good  Speech:  Clear and Coherent and Normal Rate  Volume:  Normal  Mood:  Good  Affect:  Congruent  Thought Process:  Linear and Logical  Orientation:  Full (Time, Place, and Person)  Thought Content:  Negative  Suicidal Thoughts:  No  Homicidal Thoughts:  No  Memory:  Immediate;   Good Recent;   Good Remote;   Good  Judgement:  Good  Insight:  Good  Psychomotor Activity:  Negative  Concentration:  Good  Recall:  Good  Fund of Knowledge: Good  Language: Good  Akathisia:  Negative  Handed:  Right unknown  AIMS (if indicated):  Not done  Assets:  Communication Skills Desire for Improvement Leisure  Time  ADL's:  Intact  Cognition: WNL  Sleep:  Good   Is the patient at risk to self?  No. Has the patient been a risk to self in the past 6 months?  No. Has the patient been a risk to self within the distant past?  No. Is the patient a risk to others?  No. Has the patient been a risk to others in the past 6 months?  No. Has the patient  been a risk to others within the distant past?  No.  Current Medications: Current Outpatient Prescriptions  Medication Sig Dispense Refill  . allopurinol (ZYLOPRIM) 100 MG tablet Take 1 tablet (100 mg total) by mouth daily. 90 tablet 1  . aspirin EC 81 MG EC tablet Take 1 tablet (81 mg total) by mouth daily.    . colchicine 0.6 MG tablet Take 1 tablet (0.6 mg total) by mouth daily. 90 tablet 1  . dorzolamide-timolol (COSOPT) 22.3-6.8 MG/ML ophthalmic solution Place 1 drop into both eyes 2 (two) times daily.     . ferrous sulfate 325 (65 FE) MG tablet Take 1 tablet (325 mg total) by mouth daily with breakfast. 30 tablet 0  . isosorbide mononitrate (IMDUR) 30 MG 24 hr tablet Take 1 tablet (30 mg total) by mouth daily. 30 tablet 11  . LORazepam (ATIVAN) 0.5 MG tablet Take 1 tablet (0.5 mg total) by mouth at bedtime. Pt has supply 30 tablet 0  . losartan (COZAAR) 50 MG tablet Take 50 mg by mouth daily.    . meclizine (ANTIVERT) 25 MG tablet Take 1 tablet (25 mg total) by mouth 3 (three) times daily as needed for dizziness. 30 tablet 0  . metFORMIN (GLUCOPHAGE) 500 MG tablet TAKE ONE TABLET BY MOUTH TWICE DAILY WITH MEALS 180 tablet 3  . nitroGLYCERIN (NITROSTAT) 0.4 MG SL tablet Place 1 tablet (0.4 mg total) under the tongue every 5 (five) minutes x 3 doses as needed for chest pain (Do not exceed 3 doses). 25 tablet 2  . ondansetron (ZOFRAN) 4 MG tablet Take 1 tablet (4 mg total) by mouth every 8 (eight) hours as needed for nausea or vomiting. 20 tablet 0  . Potassium 99 MG TABS Take 99 mg by mouth once.    . ticagrelor (BRILINTA) 90 MG TABS tablet Take 1  tablet (90 mg total) by mouth 2 (two) times daily. 60 tablet 11  . traZODone (DESYREL) 100 MG tablet Take 1 tablet (100 mg total) by mouth at bedtime as needed for sleep. Pt has supply 30 tablet 1  . venlafaxine XR (EFFEXOR-XR) 150 MG 24 hr capsule Take 1 capsule (150 mg total) by mouth daily. 30 capsule 3  . Vitamin D, Ergocalciferol, (DRISDOL) 50000 units CAPS capsule Take 1 capsule (50,000 Units total) by mouth every 7 (seven) days. 4 capsule 0  . carvedilol (COREG) 12.5 MG tablet Take 1 tablet (12.5 mg total) by mouth as directed. 1 tablet in the AM and 1 and 1/2 tabs in the PM x 1 week; then increase to 1 1/2 tabs twice daily (Patient taking differently: Take 12.5 mg by mouth daily. 1 tablet in the AM and 1 and 1/2 tabs in the PM x 1 week; then increase to 1 1/2 tabs twice daily) 180 tablet 3  . furosemide (LASIX) 40 MG tablet Take 1 tablet (40 mg total) by mouth as directed. 40 mg on Mon, Wed and Fri's; all other days 20 mg 90 tablet 3  . spironolactone (ALDACTONE) 25 MG tablet Take 0.5 tablets (12.5 mg total) by mouth daily. 45 tablet 11   No current facility-administered medications for this visit.     Medical Decision Making:  Established Problem, Stable/Improving (1)  Treatment Plan Summary:Medication management   Patient will continue on Effexor XR 150 mg in the morning.  She does  take lorazepam and trazodone on a prn basis  She has meds  at home and will not be refilled.  Follow-up in 3 months  or earlier depending on her symptoms    More than 50% of the time spent in psychoeducation, counseling and coordination of care.     This note was generated in part or whole with voice recognition software. Voice regonition is usually quite accurate but there are transcription errors that can and very often do occur. I apologize for any typographical errors that were not detected and corrected.    Rainey Pines, MD 07/02/2017, 10:01 AM

## 2017-07-02 NOTE — Telephone Encounter (Signed)
Patient is returning a call to schedule a colonoscopy °

## 2017-07-03 ENCOUNTER — Other Ambulatory Visit: Payer: Self-pay

## 2017-07-03 DIAGNOSIS — Z1212 Encounter for screening for malignant neoplasm of rectum: Principal | ICD-10-CM

## 2017-07-03 DIAGNOSIS — Z1211 Encounter for screening for malignant neoplasm of colon: Secondary | ICD-10-CM

## 2017-07-03 NOTE — Progress Notes (Signed)
Office: [phone]  /  Fax: [phone]   HPI:   Chief Complaint: OBESITY Melanie Ray is here to discuss her progress with her obesity treatment plan. She is on the Category 1 plan + 100 calories plan and is following her eating plan approximately 80 % of the time. She states she is exercising 0 minutes 0 times per week. Melanie Ray has increased celebration eating in the last 2 weeks due to multiple celebration situations but her hunger is controlled.  Her weight is 168 lb (76.2 kg) today and has gained 1 pound since her last visit. She has lost 9 lbs since starting treatment with [name].  Postmenopausal Hot Flashes Melanie Ray is currently on Spironolactone and states that she has started taking extra potassium and black cohosh for hot flashes and asked if this is okay. Her last potassium level was within normal range.  ALLERGIES: Allergies  Allergen Reactions  . Meperidine Nausea And Vomiting  . Tetracycline Swelling    MEDICATIONS: Current Outpatient Prescriptions on File Prior to Visit  Medication Sig Dispense Refill  . allopurinol (ZYLOPRIM) 100 MG tablet Take 1 tablet (100 mg total) by mouth daily. 90 tablet 1  . aspirin EC 81 MG EC tablet Take 1 tablet (81 mg total) by mouth daily.    . colchicine 0.6 MG tablet Take 1 tablet (0.6 mg total) by mouth daily. 90 tablet 1  . dorzolamide-timolol (COSOPT) 22.3-6.8 MG/ML ophthalmic solution Place 1 drop into both eyes 2 (two) times daily.     . ferrous sulfate 325 (65 FE) MG tablet Take 1 tablet (325 mg total) by mouth daily with breakfast. 30 tablet 0  . isosorbide mononitrate (IMDUR) 30 MG 24 hr tablet Take 1 tablet (30 mg total) by mouth daily. 30 tablet 11  . LORazepam (ATIVAN) 0.5 MG tablet Take 1 tablet (0.5 mg total) by mouth at bedtime. Pt has supply 30 tablet 0  . losartan (COZAAR) 50 MG tablet Take 50 mg by mouth daily.    . meclizine (ANTIVERT) 25 MG tablet Take 1 tablet (25 mg total) by mouth 3 (three) times daily as needed for  dizziness. 30 tablet 0  . metFORMIN (GLUCOPHAGE) 500 MG tablet TAKE ONE TABLET BY MOUTH TWICE DAILY WITH MEALS 180 tablet 3  . nitroGLYCERIN (NITROSTAT) 0.4 MG SL tablet Place 1 tablet (0.4 mg total) under the tongue every 5 (five) minutes x 3 doses as needed for chest pain (Do not exceed 3 doses). 25 tablet 2  . ondansetron (ZOFRAN) 4 MG tablet Take 1 tablet (4 mg total) by mouth every 8 (eight) hours as needed for nausea or vomiting. 20 tablet 0  . Potassium 99 MG TABS Take 99 mg by mouth once.    . ticagrelor (BRILINTA) 90 MG TABS tablet Take 1 tablet (90 mg total) by mouth 2 (two) times daily. 60 tablet 11  . traZODone (DESYREL) 100 MG tablet Take 1 tablet (100 mg total) by mouth at bedtime as needed for sleep. Pt has supply 30 tablet 1  . venlafaxine XR (EFFEXOR-XR) 150 MG 24 hr capsule Take 1 capsule (150 mg total) by mouth daily. 30 capsule 3  . Vitamin D, Ergocalciferol, (DRISDOL) 50000 units CAPS capsule Take 1 capsule (50,000 Units total) by mouth every 7 (seven) days. 4 capsule 0  . carvedilol (COREG) 12.5 MG tablet Take 1 tablet (12.5 mg total) by mouth as directed. 1 tablet in the AM and 1 and 1/2 tabs in the PM x 1 week; then increase to 1  1/2 tabs twice daily (Patient taking differently: Take 12.5 mg by mouth daily. 1 tablet in the AM and 1 and 1/2 tabs in the PM x 1 week; then increase to 1 1/2 tabs twice daily) 180 tablet 3  . furosemide (LASIX) 40 MG tablet Take 1 tablet (40 mg total) by mouth as directed. 40 mg on Mon, Wed and Fri's; all other days 20 mg 90 tablet 3  . spironolactone (ALDACTONE) 25 MG tablet Take 0.5 tablets (12.5 mg total) by mouth daily. 45 tablet 11   No current facility-administered medications on file prior to visit.     PAST MEDICAL HISTORY: Past Medical History:  Diagnosis Date  . Anemia   . Anxiety   . Back pain   . CAD (coronary artery disease) 04/11/2016   S/p ant STEMI 5/17: LHC >> LAD proximal 80%, mid 80%, distal 50%, ostial D1 60%; LCx with LPDA  lesion 30%; RCA Mild calcification with no significant stenosis in a medium caliber, nondominant RCA; LVEF is estimated at 45% with inferoapical and lateral wall akinesis >> PCI: PCI: 3.5 x 24 mm Promus DES to prox LAD, 2.5 x 12 mm Promus DES to mid LAD.   [name] Chest pain   . Chronic systolic CHF (congestive heart failure) ([place]) 03/21/2016   Echo 01/30/17: Diff HK, mild focal basal septal hypertrophy, EF 30-35, mild AI, MAC, mild MR // Echo 06/08/16: Mild focal basal septal hypertrophy, EF 25-30%, diff HK, ant-septal AK, Gr 1 DD, mild AI, MAC, mild MR, PASP 37 mmHg // Echo 03/18/16: EF 30-35%, ant-septal AK, Gr 1 DD, mild MR, severe LAE.  [name] Constipation   . Depression   . Diabetes mellitus type 2 in obese ([place])   . Dizziness   . Glaucoma   . Gout   . Heart disease   . Heartburn   . History of acute anterior wall MI 03/17/2016  . History of heart attack   . Hyperlipemia   . Hypertension   . Ischemic cardiomyopathy 10/02/2016   Refused ICD  . Joint pain   . Left bundle branch block   . Nausea   . Sleep apnea   . SOB (shortness of breath)   . Swelling    feet or legs    PAST SURGICAL HISTORY: Past Surgical History:  Procedure Laterality Date  . APPENDECTOMY    . CARDIAC CATHETERIZATION N/A 03/17/2016   Procedure: Left Heart Cath and Coronary Angiography;  Surgeon: Sherren Mocha, MD;  Location: [facility];  Service: Cardiovascular;  Laterality: N/A;  . CARDIAC CATHETERIZATION N/A 03/17/2016   Procedure: Coronary Stent Intervention;  Surgeon: Sherren Mocha, MD;  Location: [facility] [facility];  Service: Cardiovascular;  Laterality: N/A;  . SMALL BOWEL REPAIR    . TONSILLECTOMY    . UTERINE FIBROID SURGERY      SOCIAL HISTORY: Social History  Substance Use Topics  . Smoking status: Former Smoker    Types: Cigarettes    Quit date: 04/13/1997  . Smokeless tobacco: Never Used  . Alcohol use 0.6 oz/week    1 Glasses of wine per week     Comment: 2-3 glasses of wine per month     FAMILY HISTORY: Family History  Problem Relation Age of Onset  . Anxiety disorder Mother   . Paranoid behavior Mother   . Hypertension Mother   . Heart failure Mother   . Stroke Mother   . Dementia Mother   . High Cholesterol Mother   . Diabetes  Mother   . Hyperlipidemia Mother   . Heart disease Mother   . Depression Mother   . Hypertension Father   . High Cholesterol Father   . Mood Disorder Sister   . Stroke Sister   . Anxiety disorder Maternal Aunt   . Drug abuse Cousin   . Tuberculosis Paternal Grandfather     ROS: Review of Systems  Constitutional: Negative for weight loss.    PHYSICAL EXAM: Blood pressure 134/71, pulse 74, temperature 98.1 F (36.7 C), temperature source Oral, height _0  (1.6 m), weight 168 lb (76.2 kg), SpO2 99 %. Body mass index is 29.76 kg/m. Physical Exam  Constitutional: She is oriented to person, place, and time. She appears well-developed and well-nourished.  Cardiovascular: Normal rate.   Pulmonary/Chest: Effort normal.  Musculoskeletal: Normal range of motion.  Neurological: She is oriented to person, place, and time.  Skin: Skin is warm and dry.  Psychiatric: She has a normal mood and affect.  Vitals reviewed.   RECENT LABS AND TESTS: BMET    Component Value Date/Time   NA 140 06/03/2017 1036   K 4.7 06/03/2017 1036   CL 104 06/03/2017 1036   CO2 20 06/03/2017 1036   GLUCOSE 78 06/03/2017 1036   GLUCOSE 107 (H) 01/03/2017 0947   BUN 20 06/03/2017 1036   CREATININE 0.80 06/03/2017 1036   CREATININE 1.12 (H) 10/16/2016 1412   CALCIUM 10.1 06/03/2017 1036   GFRNONAA 77 06/03/2017 1036   GFRAA 88 06/03/2017 1036   Lab Results  Component Value Date   HGBA1C 6.1 (H) 06/03/2017   HGBA1C 6.5 01/03/2017   HGBA1C 6.2 (H) 03/18/2016   HGBA1C 6.8 (H) 07/28/2015   Lab Results  Component Value Date   INSULIN 37.0 (H) 06/03/2017   CBC    Component Value Date/Time   WBC 6.8 06/03/2017 1036   WBC 6.8 01/03/2017 0947    RBC 4.01 06/03/2017 1036   RBC 4.52 01/03/2017 0947   HGB 10.9 (L) 06/03/2017 1036   HCT 33.7 (L) 06/03/2017 1036   PLT 387.0 01/03/2017 0947   MCV 84 06/03/2017 1036   MCH 27.2 06/03/2017 1036   MCH 27.1 11/22/2016 1138   MCHC 32.3 06/03/2017 1036   MCHC 33.0 01/03/2017 0947   RDW 14.5 06/03/2017 1036   LYMPHSABS 2.1 06/03/2017 1036   EOSABS 0.1 06/03/2017 1036   BASOSABS 0.0 06/03/2017 1036   Iron/TIBC/Ferritin/ %Sat No results found for: IRON, TIBC, FERRITIN, IRONPCTSAT Lipid Panel     Component Value Date/Time   CHOL 124 03/21/2017 1022   TRIG 114 03/21/2017 1022   HDL 36 (L) 03/21/2017 1022   CHOLHDL 3.4 03/21/2017 1022   CHOLHDL 4 01/03/2017 0947   VLDL 22.6 01/03/2017 0947   LDLCALC 65 03/21/2017 1022   Hepatic Function Panel     Component Value Date/Time   PROT 7.2 06/03/2017 1036   ALBUMIN 4.4 06/03/2017 1036   AST 17 06/03/2017 1036   ALT 13 06/03/2017 1036   ALKPHOS 126 (H) 06/03/2017 1036   BILITOT <0.2 06/03/2017 1036      Component Value Date/Time   TSH 0.934 06/03/2017 1036   TSH 1.02 01/03/2017 0947   TSH 0.589 03/18/2016 0158    ASSESSMENT AND PLAN: Postmenopausal - Hot flashes  Class 1 obesity without serious comorbidity with body mass index (BMI) of 30.0 to 30.9 in adult, unspecified obesity type - Starting BMI greater than 30.  PLAN:  Postmenopausal Hot Flashes Melanie Ray has been advised that it is okay  to take black cohosh but to discontinue the potassium, as her Spironolactone will likely cause her potassium to elevate and could cause problems. Melanie Ray agreed to discontinue the potassium and will follow up in 2 to 3 weeks.  We spent > than 50% of the 15 minute visit on the counseling as documented in the note.  Obesity Melanie Ray is currently in the action stage of change. As such, her goal is to continue with weight loss efforts She has agreed to follow the Category 1 plan + 100 calories Melanie Ray has been instructed to work up to a goal of  150 minutes of combined cardio and strengthening exercise per week for weight loss and overall health benefits. We discussed the following Behavioral Modification Strategies today: increasing lean protein intake and decreasing simple carbohydrates and increase H20 intake   Melanie Ray has agreed to follow up with our clinic in 2 to 3 weeks. She was informed of the importance of frequent follow up visits to maximize her success with intensive lifestyle modifications for her multiple health conditions.  I, Melanie Ray, am acting as transcriptionist for Melanie Nip, MD  I have reviewed the above documentation for accuracy and completeness, and I agree with the above. -Melanie Nip, MD    OBESITY BEHAVIORAL INTERVENTION VISIT  Today's visit was # 3 out of 22.  Starting weight: 177 lbs Starting date: 06/03/17 Today's weight : 168 lbs Today's date: 07/02/17 Total lbs lost to date: 9 (Patients must lose 7 lbs in the first 6 months to continue with counseling)   ASK: We discussed the diagnosis of obesity with Melanie Ray today and Melanie Ray agreed to give [name] permission to discuss obesity behavioral modification therapy today.  ASSESS: Melanie Ray has the diagnosis of obesity and her BMI today is 52 Melanie Ray is in the action stage of change   ADVISE: Melanie Ray was educated on the multiple health risks of obesity as well as the benefit of weight loss to improve her health. She was advised of the need for long term treatment and the importance of lifestyle modifications.  AGREE: Multiple dietary modification options and treatment options were discussed and  Melanie Ray agreed to follow the Category 1 plan+ 100 calories We discussed the following Behavioral Modification Strategies today: increasing lean protein intake and decreasing simple carbohydrates and increase H20 intake

## 2017-07-09 ENCOUNTER — Other Ambulatory Visit: Payer: Self-pay | Admitting: Family Medicine

## 2017-07-11 ENCOUNTER — Ambulatory Visit: Payer: Medicare Other | Admitting: Neurology

## 2017-07-16 ENCOUNTER — Ambulatory Visit: Payer: Medicare Other | Admitting: Neurology

## 2017-07-18 ENCOUNTER — Telehealth: Payer: Self-pay | Admitting: Gastroenterology

## 2017-07-18 ENCOUNTER — Other Ambulatory Visit: Payer: Self-pay

## 2017-07-18 NOTE — Telephone Encounter (Signed)
Patient needs cardiac clearance for Colonoscopy on 07/24/17. Cardiologist is Dr. Burt Knack with [facility] in [place] telephone # [phone].

## 2017-07-22 ENCOUNTER — Ambulatory Visit (INDEPENDENT_AMBULATORY_CARE_PROVIDER_SITE_OTHER): Payer: Medicare Other | Admitting: Family Medicine

## 2017-07-22 ENCOUNTER — Telehealth: Payer: Self-pay | Admitting: Cardiovascular Disease

## 2017-07-22 ENCOUNTER — Telehealth: Payer: Self-pay | Admitting: Gastroenterology

## 2017-07-22 ENCOUNTER — Telehealth: Payer: Self-pay

## 2017-07-22 NOTE — Telephone Encounter (Signed)
Confirmed with patient she is not taking Brilinta (she discontinued the medication in June).  Informed her Dr. Burt Knack has cleared her for colonoscopy.  She was grateful for assistance.   To Dr. Jonathon Bellows

## 2017-07-22 NOTE — Telephone Encounter (Signed)
Called for Blood thinner request clearance.   Response to be forwarded in [facility]

## 2017-07-22 NOTE — Telephone Encounter (Signed)
New message         [facility] Pre-operative Risk Assessment    Request for surgical clearance:  What type of surgery is being performed?  colonoscopy 1. When is this surgery scheduled?  07-24-17  2. Are there any medications that need to be held prior to surgery and how long? Hold brilinta  3. Name of physician performing surgery?  Dr Vicente Males 4. What is your office phone and fax number?  Send in [place] 07/22/2017, 8:16 AM  _________________________________________________________________   (provider comments below)

## 2017-07-22 NOTE — Telephone Encounter (Signed)
The patient was advised to discontinue brilinta in May of this year. If she is still on this medication she can stop it now. Brilinta will still be inhibiting her platelets and 48 hours and I'm not sure that she should have biopsies if she undergoes colonoscopy as scheduled on the 19th. If she has artery discontinued Brilinta then she can obviously proceed.

## 2017-07-22 NOTE — Telephone Encounter (Signed)
Patient left a voice message for you to call her. She has some medication questions before her procedure. Please call

## 2017-07-23 ENCOUNTER — Ambulatory Visit (INDEPENDENT_AMBULATORY_CARE_PROVIDER_SITE_OTHER): Payer: Medicare Other | Admitting: Family Medicine

## 2017-07-23 ENCOUNTER — Telehealth: Payer: Self-pay | Admitting: Family Medicine

## 2017-07-23 VITALS — BP 127/68 | HR 69 | Temp 98.2°F | Ht 63.0 in | Wt 168.0 lb

## 2017-07-23 DIAGNOSIS — E559 Vitamin D deficiency, unspecified: Secondary | ICD-10-CM

## 2017-07-23 DIAGNOSIS — E669 Obesity, unspecified: Secondary | ICD-10-CM

## 2017-07-23 DIAGNOSIS — Z6830 Body mass index (BMI) 30.0-30.9, adult: Secondary | ICD-10-CM

## 2017-07-23 MED ORDER — VITAMIN D (ERGOCALCIFEROL) 1.25 MG (50000 UNIT) PO CAPS: 50000.0000 [IU] | ORAL_CAPSULE | ORAL | 0 refills | Status: DC

## 2017-07-23 NOTE — Telephone Encounter (Signed)
Left pt message (and pt spouse) asking to call Ebony Hail back directly at [phone] to schedule AWV. Thanks!  *NOTE* No hx of AWV

## 2017-07-24 NOTE — Progress Notes (Signed)
.   Office: [phone]  /  Fax: [phone]   HPI:   Chief Complaint: OBESITY Melanie Ray is here to discuss her progress with her obesity treatment plan. She is on the Category 1 plan and is following her eating plan approximately 75 % of the time. She states she is exercising 0 minutes 0 times per week. Melanie Ray has done well maintaining weight while traveling and on vacation. She is getting bored with her diet plan and would like to look at other options. She is deviating from her plan more. Her weight is 168 lb (76.2 kg) today and she has maintained weight over a period of 3 weeks since her last visit. She has lost 9 lbs since starting treatment with [name].  Vitamin D deficiency Melanie Ray has a diagnosis of vitamin D deficiency. She is currently out of vit D and level is not yet at goal. Melanie Ray denies nausea, vomiting or muscle weakness. Melanie Ray requests a refill of vit D.   ALLERGIES: Allergies  Allergen Reactions   Tetracycline Swelling   Meperidine Nausea And Vomiting    Nausea    MEDICATIONS: Current Outpatient Prescriptions on File Prior to Visit  Medication Sig Dispense Refill   aspirin EC 81 MG EC tablet Take 1 tablet (81 mg total) by mouth daily.     atorvastatin (LIPITOR) 80 MG tablet      colchicine 0.6 MG tablet Take 1 tablet (0.6 mg total) by mouth daily. 90 tablet 1   dorzolamide-timolol (COSOPT) 22.3-6.8 MG/ML ophthalmic solution Place 1 drop into both eyes 2 (two) times daily.      ferrous sulfate 325 (65 FE) MG tablet Take 1 tablet (325 mg total) by mouth daily with breakfast. 30 tablet 0   hydrochlorothiazide (HYDRODIURIL) 25 MG tablet Take 0.5 Tabs by Mouth Once a Day.     isosorbide mononitrate (IMDUR) 30 MG 24 hr tablet Take 1 tablet (30 mg total) by mouth daily. 30 tablet 11   LORazepam (ATIVAN) 0.5 MG tablet Take 1 tablet (0.5 mg total) by mouth at bedtime. Pt has supply 30 tablet 0   losartan (COZAAR) 50 MG tablet Take 50 mg by mouth daily.      metFORMIN (GLUCOPHAGE) 500 MG tablet TAKE ONE TABLET BY MOUTH TWICE DAILY WITH MEALS 180 tablet 3   neomycin-polymyxin b-dexamethasone (MAXITROL) 3.5-10000-0.1 OINT      nitroGLYCERIN (NITROSTAT) 0.4 MG SL tablet Place 1 tablet (0.4 mg total) under the tongue every 5 (five) minutes x 3 doses as needed for chest pain (Do not exceed 3 doses). 25 tablet 2   ondansetron (ZOFRAN) 4 MG tablet Take 1 tablet (4 mg total) by mouth every 8 (eight) hours as needed for nausea or vomiting. 20 tablet 0   ondansetron (ZOFRAN) 4 MG tablet TAKE 1 TABLET BY MOUTH ONCE DAILY AS NEEDED FOR NAUSEA AND VOMITING 20 tablet 1   traZODone (DESYREL) 100 MG tablet Take 1 tablet (100 mg total) by mouth at bedtime as needed for sleep. Pt has supply 30 tablet 1   venlafaxine XR (EFFEXOR-XR) 150 MG 24 hr capsule Take 1 capsule (150 mg total) by mouth daily. 30 capsule 3   carvedilol (COREG) 12.5 MG tablet Take 1 tablet (12.5 mg total) by mouth as directed. 1 tablet in the AM and 1 and 1/2 tabs in the PM x 1 week; then increase to 1 1/2 tabs twice daily (Patient taking differently: Take 12.5 mg by mouth daily. 1 tablet in the AM and 1 and 1/2 tabs  in the PM x 1 week; then increase to 1 1/2 tabs twice daily) 180 tablet 3   furosemide (LASIX) 40 MG tablet Take 1 tablet (40 mg total) by mouth as directed. 40 mg on Mon, Wed and Fri's; all other days 20 mg 90 tablet 3   spironolactone (ALDACTONE) 25 MG tablet Take 0.5 tablets (12.5 mg total) by mouth daily. 45 tablet 11   No current facility-administered medications on file prior to visit.     PAST MEDICAL HISTORY: Past Medical History:  Diagnosis Date   Anemia    Anxiety    Back pain    CAD (coronary artery disease) 04/11/2016   S/p ant STEMI 5/17: LHC >> LAD proximal 80%, mid 80%, distal 50%, ostial D1 60%; LCx with LPDA lesion 30%; RCA Mild calcification with no significant stenosis in a medium caliber, nondominant RCA; LVEF is estimated at 45% with inferoapical and  lateral wall akinesis >> PCI: PCI: 3.5 x 24 mm Promus DES to prox LAD, 2.5 x 12 mm Promus DES to mid LAD.    Chest pain    Chronic systolic CHF (congestive heart failure) ([place]) 03/21/2016   Echo 01/30/17: Diff HK, mild focal basal septal hypertrophy, EF 30-35, mild AI, MAC, mild MR // Echo 06/08/16: Mild focal basal septal hypertrophy, EF 25-30%, diff HK, ant-septal AK, Gr 1 DD, mild AI, MAC, mild MR, PASP 37 mmHg // Echo 03/18/16: EF 30-35%, ant-septal AK, Gr 1 DD, mild MR, severe LAE.   Constipation    Depression    Diabetes mellitus type 2 in obese (HCC)    Dizziness    Glaucoma    Gout    Heart disease    Heartburn    History of acute anterior wall MI 03/17/2016   History of heart attack    Hyperlipemia    Hypertension    Ischemic cardiomyopathy 10/02/2016   Refused ICD   Joint pain    Left bundle branch block    Nausea    Sleep apnea    SOB (shortness of breath)    Swelling    feet or legs    PAST SURGICAL HISTORY: Past Surgical History:  Procedure Laterality Date   APPENDECTOMY     CARDIAC CATHETERIZATION N/A 03/17/2016   Procedure: Left Heart Cath and Coronary Angiography;  Surgeon: Sherren Mocha, MD;  Location: [facility];  Service: Cardiovascular;  Laterality: N/A;   CARDIAC CATHETERIZATION N/A 03/17/2016   Procedure: Coronary Stent Intervention;  Surgeon: Sherren Mocha, MD;  Location: [facility];  Service: Cardiovascular;  Laterality: N/A;   SMALL BOWEL REPAIR     TONSILLECTOMY     UTERINE FIBROID SURGERY      SOCIAL HISTORY: Social History  Substance Use Topics   Smoking status: Former Smoker    Types: Cigarettes    Quit date: 04/13/1997   Smokeless tobacco: Never Used   Alcohol use 0.6 oz/week    1 Glasses of wine per week     Comment: 2-3 glasses of wine per month    FAMILY HISTORY: Family History  Problem Relation Age of Onset   Anxiety disorder Mother    Paranoid behavior Mother    Hypertension Mother      Heart failure Mother    Stroke Mother    Dementia Mother    High Cholesterol Mother    Diabetes Mother    Hyperlipidemia Mother    Heart disease Mother    Depression Mother    Hypertension Father  High Cholesterol Father    Mood Disorder Sister    Stroke Sister    Anxiety disorder Maternal Aunt    Drug abuse Cousin    Tuberculosis Paternal Grandfather     ROS: Review of Systems  Constitutional: Negative for weight loss.    PHYSICAL EXAM: Blood pressure 127/68, pulse 69, temperature 98.2 F (36.8 C), temperature source Oral, height _0  (1.6 m), weight 168 lb (76.2 kg), SpO2 95 %. Body mass index is 29.76 kg/m. Physical Exam  Constitutional: She is oriented to person, place, and time. She appears well-developed and well-nourished.  Cardiovascular: Normal rate.   Pulmonary/Chest: Effort normal.  Musculoskeletal: Normal range of motion.  Neurological: She is oriented to person, place, and time.  Skin: Skin is warm and dry.  Psychiatric: She has a normal mood and affect. Her behavior is normal.  Vitals reviewed.   RECENT LABS AND TESTS: BMET    Component Value Date/Time   NA 140 06/03/2017 1036   K 4.7 06/03/2017 1036   CL 104 06/03/2017 1036   CO2 20 06/03/2017 1036   GLUCOSE 78 06/03/2017 1036   GLUCOSE 107 (H) 01/03/2017 0947   BUN 20 06/03/2017 1036   CREATININE 0.80 06/03/2017 1036   CREATININE 1.12 (H) 10/16/2016 1412   CALCIUM 10.1 06/03/2017 1036   GFRNONAA 77 06/03/2017 1036   GFRAA 88 06/03/2017 1036   Lab Results  Component Value Date   HGBA1C 6.1 (H) 06/03/2017   HGBA1C 6.5 01/03/2017   HGBA1C 6.2 (H) 03/18/2016   HGBA1C 6.8 (H) 07/28/2015   Lab Results  Component Value Date   INSULIN 37.0 (H) 06/03/2017   CBC    Component Value Date/Time   WBC 6.8 06/03/2017 1036   WBC 6.8 01/03/2017 0947   RBC 4.01 06/03/2017 1036   RBC 4.52 01/03/2017 0947   HGB 10.9 (L) 06/03/2017 1036   HCT 33.7 (L) 06/03/2017 1036   PLT  387.0 01/03/2017 0947   MCV 84 06/03/2017 1036   MCH 27.2 06/03/2017 1036   MCH 27.1 11/22/2016 1138   MCHC 32.3 06/03/2017 1036   MCHC 33.0 01/03/2017 0947   RDW 14.5 06/03/2017 1036   LYMPHSABS 2.1 06/03/2017 1036   EOSABS 0.1 06/03/2017 1036   BASOSABS 0.0 06/03/2017 1036   Iron/TIBC/Ferritin/ %Sat No results found for: IRON, TIBC, FERRITIN, IRONPCTSAT Lipid Panel     Component Value Date/Time   CHOL 124 03/21/2017 1022   TRIG 114 03/21/2017 1022   HDL 36 (L) 03/21/2017 1022   CHOLHDL 3.4 03/21/2017 1022   CHOLHDL 4 01/03/2017 0947   VLDL 22.6 01/03/2017 0947   LDLCALC 65 03/21/2017 1022   Hepatic Function Panel     Component Value Date/Time   PROT 7.2 06/03/2017 1036   ALBUMIN 4.4 06/03/2017 1036   AST 17 06/03/2017 1036   ALT 13 06/03/2017 1036   ALKPHOS 126 (H) 06/03/2017 1036   BILITOT <0.2 06/03/2017 1036      Component Value Date/Time   TSH 0.934 06/03/2017 1036   TSH 1.02 01/03/2017 0947   TSH 0.589 03/18/2016 0158    ASSESSMENT AND PLAN: Vitamin D deficiency - Plan: Vitamin D, Ergocalciferol, (DRISDOL) 50000 units CAPS capsule  Class 1 obesity with serious comorbidity and body mass index (BMI) of 30.0 to 30.9 in adult, unspecified obesity type - starting BMI greater than 30  PLAN:  Vitamin D Deficiency Melanie Ray was informed that low vitamin D levels contributes to fatigue and are associated with obesity, breast, and colon cancer.  She agrees to continue to take prescription Vit D _0 ,000 IU every week, we will refill for 1 month and will re-check labs in 1 month and will follow up for routine testing of vitamin D, at least 2-3 times per year. She was informed of the risk of over-replacement of vitamin D and agrees to not increase her dose unless he discusses this with [name] first. Melanie Ray agrees to follow up with our clinic in 2 to 3 weeks.  Obesity Melanie Ray is currently in the action stage of change. As such, her goal is to continue with weight loss  efforts She has agreed to change to change to follow the Pescatarian eating plan Melanie Ray has been instructed to work up to a goal of 150 minutes of combined cardio and strengthening exercise per week for weight loss and overall health benefits. We discussed the following Behavioral Modification Strategies today: increasing lean protein intake, decreasing simple carbohydrates  and work on meal planning and easy cooking plans  Melanie Ray has agreed to follow up with our clinic in 2 to 3 weeks. She was informed of the importance of frequent follow up visits to maximize her success with intensive lifestyle modifications for her multiple health conditions.  I, Doreene Nest, am acting as transcriptionist for Dennard Nip, MD  I have reviewed the above documentation for accuracy and completeness, and I agree with the above. -Dennard Nip, MD    OBESITY BEHAVIORAL INTERVENTION VISIT  Today's visit was # 4 out of 22.  Starting weight: 177 lbs Starting date: 06/03/17 Today's weight : 168 lbs  Today's date: 07/23/2017 Total lbs lost to date: 9 (Patients must lose 7 lbs in the first 6 months to continue with counseling)   ASK: We discussed the diagnosis of obesity with Melanie Ray today and Melanie Ray agreed to give [name] permission to discuss obesity behavioral modification therapy today.  ASSESS: Melanie Ray has the diagnosis of obesity and her BMI today is 29.77 Melanie Ray is in the action stage of change   ADVISE: Melanie Ray was educated on the multiple health risks of obesity as well as the benefit of weight loss to improve her health. She was advised of the need for long term treatment and the importance of lifestyle modifications.  AGREE: Multiple dietary modification options and treatment options were discussed and Melanie Ray agreed to change to follow the Pescatarian eating plan We discussed the following Behavioral Modification Strategies today: increasing lean protein intake, decreasing simple  carbohydrates  and work on meal planning and easy cooking plans

## 2017-07-25 ENCOUNTER — Encounter: Admission: RE | Payer: Self-pay | Source: Ambulatory Visit

## 2017-07-25 ENCOUNTER — Other Ambulatory Visit: Payer: Self-pay

## 2017-07-25 ENCOUNTER — Other Ambulatory Visit (INDEPENDENT_AMBULATORY_CARE_PROVIDER_SITE_OTHER): Payer: Self-pay

## 2017-07-25 ENCOUNTER — Ambulatory Visit: Admission: RE | Admit: 2017-07-25 | Payer: Medicare Other | Source: Ambulatory Visit | Admitting: Gastroenterology

## 2017-07-25 DIAGNOSIS — D508 Other iron deficiency anemias: Secondary | ICD-10-CM

## 2017-07-25 SURGERY — COLONOSCOPY WITH PROPOFOL
Anesthesia: General

## 2017-07-25 MED ORDER — FERROUS SULFATE 325 (65 FE) MG PO TABS: 325.0000 mg | ORAL_TABLET | Freq: Every day | ORAL | 0 refills | Status: DC

## 2017-07-26 ENCOUNTER — Other Ambulatory Visit: Payer: Self-pay

## 2017-07-26 ENCOUNTER — Telehealth: Payer: Self-pay | Admitting: Psychiatry

## 2017-07-26 DIAGNOSIS — Z1211 Encounter for screening for malignant neoplasm of colon: Secondary | ICD-10-CM

## 2017-07-29 NOTE — Telephone Encounter (Signed)
Pt was seen on 8/28 and she had enough supply to last for couple of months at that time. No benzo was refilled.

## 2017-07-29 NOTE — Telephone Encounter (Signed)
She was seen on 8/28 and med was refilled x 3 month

## 2017-07-29 NOTE — Telephone Encounter (Signed)
pt called states she needs at least a 30 day supply of the lorazepam .5mg  to do until the end of oct. pt said that Dr. Gretel Acre is booked out until nov.  she needs enough medication to last until them.

## 2017-07-29 NOTE — Telephone Encounter (Signed)
pt called left message that she needs a refill on her medication pt was last seen on  07-02-17   LORazepam (ATIVAN) 0.5 MG tablet  Medication  Date: 03/11/2017 Department: [facility] Ordering/Authorizing: Rainey Pines, MD  Order Providers   Prescribing Provider Encounter Provider  Rainey Pines, MD Rainey Pines, MD  Medication Detail    Disp Refills Start End   LORazepam (ATIVAN) 0.5 MG tablet 30 tablet 0 03/11/2017    Sig - Route: Take 1 tablet (0.5 mg total) by mouth at bedtime. Pt has supply - Oral   Class: No Print    traZODone (DESYREL) 100 MG tablet  Medication  Date: 03/11/2017 Department: [facility] Ordering/Authorizing: Rainey Pines, MD  Order Providers   Prescribing Provider Encounter Provider  Rainey Pines, MD Rainey Pines, MD  Medication Detail    Disp Refills Start End   traZODone (DESYREL) 100 MG tablet 30 tablet 1 03/11/2017    Sig - Route: Take 1 tablet (100 mg total) by mouth at bedtime as needed for sleep. Pt has supply - Oral   Class: No Print

## 2017-07-29 NOTE — Telephone Encounter (Signed)
Will defer to Dr. Gretel Acre

## 2017-08-01 NOTE — Telephone Encounter (Signed)
Defer to Dr. Gretel Acre

## 2017-08-01 NOTE — Telephone Encounter (Signed)
pt called states she needs refill on her lorazepam. pt was last seen on  07-02-17 and next appt  09-16-17. This is a Dr. Gretel Acre patient.   LORazepam (ATIVAN) 0.5 MG tablet  Medication  Date: 03/11/2017 Department: [facility] Ordering/Authorizing: Rainey Pines, MD  Order Providers   Prescribing Provider Encounter Provider  Rainey Pines, MD Rainey Pines, MD  Medication Detail    Disp Refills Start End   LORazepam (ATIVAN) 0.5 MG tablet 30 tablet 0 03/11/2017    Sig - Route: Take 1 tablet (0.5 mg total) by mouth at bedtime. Pt has supply - Oral   Class: No Print

## 2017-08-06 ENCOUNTER — Other Ambulatory Visit: Payer: Self-pay

## 2017-08-06 ENCOUNTER — Encounter: Admission: RE | Payer: Self-pay | Source: Ambulatory Visit

## 2017-08-06 ENCOUNTER — Ambulatory Visit (INDEPENDENT_AMBULATORY_CARE_PROVIDER_SITE_OTHER): Payer: Medicare Other | Admitting: Physician Assistant

## 2017-08-06 ENCOUNTER — Ambulatory Visit: Admission: RE | Admit: 2017-08-06 | Payer: Medicare Other | Source: Ambulatory Visit | Admitting: Gastroenterology

## 2017-08-06 DIAGNOSIS — Z1211 Encounter for screening for malignant neoplasm of colon: Secondary | ICD-10-CM

## 2017-08-06 DIAGNOSIS — Z1212 Encounter for screening for malignant neoplasm of rectum: Principal | ICD-10-CM

## 2017-08-06 SURGERY — COLONOSCOPY WITH PROPOFOL
Anesthesia: General

## 2017-08-07 ENCOUNTER — Ambulatory Visit (INDEPENDENT_AMBULATORY_CARE_PROVIDER_SITE_OTHER): Payer: Medicare Other | Admitting: Physician Assistant

## 2017-08-07 NOTE — Telephone Encounter (Signed)
pt called left message that she needs a refill on her lorazepam. please review pervious message pt was last seen on  07-02-17 and next appt 09-16-17

## 2017-08-08 NOTE — Telephone Encounter (Signed)
Please advise 

## 2017-08-12 ENCOUNTER — Encounter: Payer: Self-pay | Admitting: *Deleted

## 2017-08-12 ENCOUNTER — Ambulatory Visit (INDEPENDENT_AMBULATORY_CARE_PROVIDER_SITE_OTHER): Payer: Medicare Other | Admitting: Physician Assistant

## 2017-08-12 ENCOUNTER — Encounter (INDEPENDENT_AMBULATORY_CARE_PROVIDER_SITE_OTHER): Payer: Self-pay

## 2017-08-13 ENCOUNTER — Encounter
Admission: RE | Disposition: A | Discharge status: Home / Self Care | Payer: Self-pay | Source: Ambulatory Visit | Attending: Gastroenterology

## 2017-08-13 ENCOUNTER — Ambulatory Visit
Admission: RE | Admit: 2017-08-13 | Discharge: 2017-08-13 | Disposition: A | Discharge status: Home / Self Care | Payer: Medicare Other | Source: Ambulatory Visit | Attending: Gastroenterology | Admitting: Gastroenterology

## 2017-08-13 ENCOUNTER — Ambulatory Visit: Payer: Medicare Other | Admitting: Certified Registered"

## 2017-08-13 ENCOUNTER — Ambulatory Visit: Payer: Self-pay | Admitting: Neurology

## 2017-08-13 DIAGNOSIS — I5022 Chronic systolic (congestive) heart failure: Secondary | ICD-10-CM | POA: Diagnosis not present

## 2017-08-13 DIAGNOSIS — I2510 Atherosclerotic heart disease of native coronary artery without angina pectoris: Secondary | ICD-10-CM | POA: Insufficient documentation

## 2017-08-13 DIAGNOSIS — E119 Type 2 diabetes mellitus without complications: Secondary | ICD-10-CM | POA: Diagnosis not present

## 2017-08-13 DIAGNOSIS — Z79899 Other long term (current) drug therapy: Secondary | ICD-10-CM | POA: Insufficient documentation

## 2017-08-13 DIAGNOSIS — Z1211 Encounter for screening for malignant neoplasm of colon: Secondary | ICD-10-CM

## 2017-08-13 DIAGNOSIS — F329 Major depressive disorder, single episode, unspecified: Secondary | ICD-10-CM | POA: Insufficient documentation

## 2017-08-13 DIAGNOSIS — F419 Anxiety disorder, unspecified: Secondary | ICD-10-CM | POA: Diagnosis not present

## 2017-08-13 DIAGNOSIS — Z1212 Encounter for screening for malignant neoplasm of rectum: Secondary | ICD-10-CM

## 2017-08-13 DIAGNOSIS — I255 Ischemic cardiomyopathy: Secondary | ICD-10-CM | POA: Diagnosis not present

## 2017-08-13 DIAGNOSIS — Z7984 Long term (current) use of oral hypoglycemic drugs: Secondary | ICD-10-CM | POA: Diagnosis not present

## 2017-08-13 DIAGNOSIS — M109 Gout, unspecified: Secondary | ICD-10-CM | POA: Insufficient documentation

## 2017-08-13 DIAGNOSIS — Z7982 Long term (current) use of aspirin: Secondary | ICD-10-CM | POA: Diagnosis not present

## 2017-08-13 DIAGNOSIS — Z888 Allergy status to other drugs, medicaments and biological substances status: Secondary | ICD-10-CM | POA: Diagnosis not present

## 2017-08-13 DIAGNOSIS — I739 Peripheral vascular disease, unspecified: Secondary | ICD-10-CM | POA: Diagnosis not present

## 2017-08-13 DIAGNOSIS — I110 Hypertensive heart disease with heart failure: Secondary | ICD-10-CM | POA: Diagnosis not present

## 2017-08-13 DIAGNOSIS — Z87891 Personal history of nicotine dependence: Secondary | ICD-10-CM | POA: Diagnosis not present

## 2017-08-13 DIAGNOSIS — I252 Old myocardial infarction: Secondary | ICD-10-CM | POA: Insufficient documentation

## 2017-08-13 DIAGNOSIS — E785 Hyperlipidemia, unspecified: Secondary | ICD-10-CM | POA: Diagnosis not present

## 2017-08-13 DIAGNOSIS — I509 Heart failure, unspecified: Secondary | ICD-10-CM | POA: Diagnosis not present

## 2017-08-13 DIAGNOSIS — G4730 Sleep apnea, unspecified: Secondary | ICD-10-CM | POA: Diagnosis not present

## 2017-08-13 HISTORY — DX: Acute myocardial infarction, unspecified: I21.9

## 2017-08-13 HISTORY — PX: COLONOSCOPY WITH PROPOFOL: SHX5780

## 2017-08-13 LAB — GLUCOSE, CAPILLARY: GLUCOSE-CAPILLARY: 100 mg/dL — AB (ref 65–99)

## 2017-08-13 SURGERY — COLONOSCOPY WITH PROPOFOL
Anesthesia: General

## 2017-08-13 MED ORDER — ETOMIDATE 2 MG/ML IV SOLN
INTRAVENOUS | Status: AC
  Filled 2017-08-13: qty 10

## 2017-08-13 MED ORDER — MIDAZOLAM HCL 2 MG/2ML IJ SOLN
INTRAMUSCULAR | Status: DC | PRN
  Administered 2017-08-13: 2 mg via INTRAVENOUS

## 2017-08-13 MED ORDER — LIDOCAINE HCL (CARDIAC) 20 MG/ML IV SOLN
INTRAVENOUS | Status: DC | PRN
  Administered 2017-08-13: 50 mg via INTRAVENOUS

## 2017-08-13 MED ORDER — GLYCOPYRROLATE 0.2 MG/ML IJ SOLN
INTRAMUSCULAR | Status: AC
  Filled 2017-08-13: qty 1

## 2017-08-13 MED ORDER — PROPOFOL 500 MG/50ML IV EMUL
INTRAVENOUS | Status: DC | PRN
  Administered 2017-08-13: 45 ug/kg/min via INTRAVENOUS

## 2017-08-13 MED ORDER — MIDAZOLAM HCL 2 MG/2ML IJ SOLN
INTRAMUSCULAR | Status: AC
  Filled 2017-08-13: qty 2

## 2017-08-13 MED ORDER — SODIUM CHLORIDE 0.9 % IV SOLN
INTRAVENOUS | Status: DC
  Administered 2017-08-13: 11:00:00 via INTRAVENOUS

## 2017-08-13 MED ORDER — ETOMIDATE 2 MG/ML IV SOLN
INTRAVENOUS | Status: DC | PRN
  Administered 2017-08-13: 18 mg via INTRAVENOUS

## 2017-08-13 NOTE — Anesthesia Preprocedure Evaluation (Signed)
Anesthesia Evaluation  Patient identified by MRN, date of birth, ID band Patient awake    Reviewed: Allergy & Precautions, H&P , NPO status , Patient's Chart, lab work & pertinent test results, reviewed documented beta blocker date and time   Airway Mallampati: II   Neck ROM: full    Dental  (+) Poor Dentition   Pulmonary neg pulmonary ROS, sleep apnea and Continuous Positive Airway Pressure Ventilation , former smoker,    Pulmonary exam normal        Cardiovascular Exercise Tolerance: Poor hypertension, On Medications + CAD, + Past MI, + Peripheral Vascular Disease and +CHF  negative cardio ROS Normal cardiovascular exam+ dysrhythmias  Rhythm:regular Rate:Normal     Neuro/Psych PSYCHIATRIC DISORDERS negative neurological ROS  negative psych ROS   GI/Hepatic negative GI ROS, Neg liver ROS,   Endo/Other  negative endocrine ROSdiabetes  Renal/GU negative Renal ROS  negative genitourinary   Musculoskeletal   Abdominal   Peds  Hematology negative hematology ROS (+) anemia ,   Anesthesia Other Findings Past Medical History: No date: Anemia No date: Anxiety No date: Back pain 04/11/2016: CAD (coronary artery disease)     Comment:  S/p ant STEMI 5/17: LHC >> LAD proximal 80%, mid 80%,               distal 50%, ostial D1 60%; LCx with LPDA lesion 30%; RCA               Mild calcification with no significant stenosis in a               medium caliber, nondominant RCA; LVEF is estimated at 45%              with inferoapical and lateral wall akinesis >> PCI: PCI:               3.5 x 24 mm Promus DES to prox LAD, 2.5 x 12 mm Promus               DES to mid LAD.  No date: Chest pain 4/92/0100: Chronic systolic CHF (congestive heart failure) ([place])     Comment:  Echo 01/30/17: Diff HK, mild focal basal septal               hypertrophy, EF 30-35, mild AI, MAC, mild MR // Echo               06/08/16: Mild focal basal septal  hypertrophy, EF 25-30%,               diff HK, ant-septal AK, Gr 1 DD, mild AI, MAC, mild MR,               PASP 37 mmHg // Echo 03/18/16: EF 30-35%, ant-septal AK,               Gr 1 DD, mild MR, severe LAE. No date: Constipation No date: Depression No date: Diabetes mellitus type 2 in obese (HCC) No date: Dizziness No date: Glaucoma No date: Gout No date: Heart disease No date: Heartburn 03/17/2016: History of acute anterior wall MI No date: History of heart attack No date: Hyperlipemia No date: Hypertension 10/02/2016: Ischemic cardiomyopathy     Comment:  Refused ICD No date: Joint pain No date: Left bundle branch block No date: Myocardial infarction ([place]) No date: Nausea No date: Sleep apnea No date: SOB (shortness of breath) No date: Swelling     Comment:  feet or legs Past Surgical History:  No date: APPENDECTOMY 03/17/2016: CARDIAC CATHETERIZATION; N/A     Comment:  Procedure: Left Heart Cath and Coronary Angiography;                Surgeon: Sherren Mocha, MD;  Location: [place];  Service: Cardiovascular;  Laterality: N/A; 03/17/2016: CARDIAC CATHETERIZATION; N/A     Comment:  Procedure: Coronary Stent Intervention;  Surgeon:               Sherren Mocha, MD;  Location: [place] [place] [facility];                Service: Cardiovascular;  Laterality: N/A; No date: SMALL BOWEL REPAIR No date: TONSILLECTOMY No date: UTERINE FIBROID SURGERY BMI    Body Mass Index:  30.11 kg/m     Reproductive/Obstetrics negative OB ROS                             Anesthesia Physical Anesthesia Plan  ASA: III  Anesthesia Plan: General   Post-op Pain Management:    Induction:   PONV Risk Score and Plan:   Airway Management Planned:   Additional Equipment:   Intra-op Plan:   Post-operative Plan:   Informed Consent: I have reviewed the patients History and Physical, chart, labs and discussed the procedure including the risks,  benefits and alternatives for the proposed anesthesia with the patient or authorized representative who has indicated his/her understanding and acceptance.   Dental Advisory Given  Plan Discussed with: CRNA  Anesthesia Plan Comments:         Anesthesia Quick Evaluation

## 2017-08-13 NOTE — Transfer of Care (Signed)
Immediate Anesthesia Transfer of Care Note  Patient: Marsela Kuan  Procedure(s) Performed: COLONOSCOPY WITH PROPOFOL (N/A )  Patient Location: PACU  Anesthesia Type:General  Level of Consciousness: sedated  Airway & Oxygen Therapy: Patient Spontanous Breathing and Patient connected to nasal cannula oxygen  Post-op Assessment: Report given to RN and Post -op Vital signs reviewed and stable  Post vital signs: Reviewed and stable  Last Vitals:  Vitals:   08/13/17 1112 08/13/17 1113  BP: (!) 173/86 (!) 173/86  Pulse: 71 70  Resp: 20 12  Temp: 36.4 C   SpO2: 100% 99%    Last Pain:  Vitals:   08/13/17 1112  TempSrc: Tympanic         Complications: No apparent anesthesia complications

## 2017-08-13 NOTE — H&P (Signed)
Jonathon Bellows MD [address], [place], [place] [address] Phone: [phone] Fax : [phone]  Primary Care Physician:  Leone Haven, MD Primary Gastroenterologist:  Dr. Jonathon Bellows   Pre-Procedure History & Physical: HPI:  Melanie Ray is a 66 y.o. female is here for an colonoscopy.   Past Medical History:  Diagnosis Date  . Anemia   . Anxiety   . Back pain   . CAD (coronary artery disease) 04/11/2016   S/p ant STEMI 5/17: LHC >> LAD proximal 80%, mid 80%, distal 50%, ostial D1 60%; LCx with LPDA lesion 30%; RCA Mild calcification with no significant stenosis in a medium caliber, nondominant RCA; LVEF is estimated at 45% with inferoapical and lateral wall akinesis >> PCI: PCI: 3.5 x 24 mm Promus DES to prox LAD, 2.5 x 12 mm Promus DES to mid LAD.   [name] Chest pain   . Chronic systolic CHF (congestive heart failure) ([place]) 03/21/2016   Echo 01/30/17: Diff HK, mild focal basal septal hypertrophy, EF 30-35, mild AI, MAC, mild MR // Echo 06/08/16: Mild focal basal septal hypertrophy, EF 25-30%, diff HK, ant-septal AK, Gr 1 DD, mild AI, MAC, mild MR, PASP 37 mmHg // Echo 03/18/16: EF 30-35%, ant-septal AK, Gr 1 DD, mild MR, severe LAE.  [name] Constipation   . Depression   . Diabetes mellitus type 2 in obese ([place])   . Dizziness   . Glaucoma   . Gout   . Heart disease   . Heartburn   . History of acute anterior wall MI 03/17/2016  . History of heart attack   . Hyperlipemia   . Hypertension   . Ischemic cardiomyopathy 10/02/2016   Refused ICD  . Joint pain   . Left bundle branch block   . Myocardial infarction ([place])   . Nausea   . Sleep apnea   . SOB (shortness of breath)   . Swelling    feet or legs    Past Surgical History:  Procedure Laterality Date  . APPENDECTOMY    . CARDIAC CATHETERIZATION N/A 03/17/2016   Procedure: Left Heart Cath and Coronary Angiography;  Surgeon: Sherren Mocha, MD;  Location: [facility];  Service: Cardiovascular;  Laterality: N/A;    . CARDIAC CATHETERIZATION N/A 03/17/2016   Procedure: Coronary Stent Intervention;  Surgeon: Sherren Mocha, MD;  Location: [facility];  Service: Cardiovascular;  Laterality: N/A;  . SMALL BOWEL REPAIR    . TONSILLECTOMY    . UTERINE FIBROID SURGERY      Prior to Admission medications   Medication Sig Start Date End Date Taking? Authorizing Provider  allopurinol (ZYLOPRIM) 100 MG tablet  05/22/17   [provider]  aspirin EC 81 MG EC tablet Take 1 tablet (81 mg total) by mouth daily. 03/21/16   Lyda Jester M, PA-C  atorvastatin (LIPITOR) 80 MG tablet  06/23/17   [provider]  carvedilol (COREG) 12.5 MG tablet Take 1 tablet (12.5 mg total) by mouth as directed. 1 tablet in the AM and 1 and 1/2 tabs in the PM x 1 week; then increase to 1 1/2 tabs twice daily Patient taking differently: Take 12.5 mg by mouth daily. 1 tablet in the AM and 1 and 1/2 tabs in the PM x 1 week; then increase to 1 1/2 tabs twice daily 03/29/17 06/27/17  Richardson Dopp T, PA-C  colchicine 0.6 MG tablet Take 1 tablet (0.6 mg total) by mouth daily. 05/22/17   Coral Spikes, DO  dorzolamide-timolol (COSOPT) 22.3-6.8 MG/ML ophthalmic solution Place 1 drop into both eyes 2 (two) times daily.  01/17/15   [provider]  ferrous sulfate 325 (65 FE) MG tablet Take 1 tablet (325 mg total) by mouth daily with breakfast. 07/25/17   Lacy Duverney M, PA-C  furosemide (LASIX) 40 MG tablet Take 1 tablet (40 mg total) by mouth as directed. 40 mg on Mon, Wed and Fri's; all other days 20 mg 10/19/16 03/29/17  Richardson Dopp T, PA-C  hydrochlorothiazide (HYDRODIURIL) 25 MG tablet Take 0.5 Tabs by Mouth Once a Day. 02/28/12   [provider]  isosorbide mononitrate (IMDUR) 30 MG 24 hr tablet Take 1 tablet (30 mg total) by mouth daily. 10/17/16   Sherren Mocha, MD  LORazepam (ATIVAN) 0.5 MG tablet Take 1 tablet (0.5 mg total) by mouth at bedtime. Pt has supply 03/11/17   Rainey Pines, MD  losartan  (COZAAR) 50 MG tablet Take 50 mg by mouth daily.    [provider]  meclizine (ANTIVERT) 25 MG tablet  04/23/17   [provider]  metFORMIN (GLUCOPHAGE) 500 MG tablet TAKE ONE TABLET BY MOUTH TWICE DAILY WITH MEALS 08/30/16   Coral Spikes, DO  neomycin-polymyxin b-dexamethasone (MAXITROL) 3.5-10000-0.1 OINT  07/11/17   [provider]  nitroGLYCERIN (NITROSTAT) 0.4 MG SL tablet Place 1 tablet (0.4 mg total) under the tongue every 5 (five) minutes x 3 doses as needed for chest pain (Do not exceed 3 doses). 05/23/17   Sherren Mocha, MD  ondansetron (ZOFRAN) 4 MG tablet Take 1 tablet (4 mg total) by mouth every 8 (eight) hours as needed for nausea or vomiting. 05/17/17   Thersa Salt G, DO  ondansetron (ZOFRAN) 4 MG tablet TAKE 1 TABLET BY MOUTH ONCE DAILY AS NEEDED FOR NAUSEA AND VOMITING 07/10/17   Leone Haven, MD  spironolactone (ALDACTONE) 25 MG tablet Take 0.5 tablets (12.5 mg total) by mouth daily. 08/03/16 03/29/17  Richardson Dopp T, PA-C  traZODone (DESYREL) 100 MG tablet Take 1 tablet (100 mg total) by mouth at bedtime as needed for sleep. Pt has supply 03/11/17   Rainey Pines, MD  venlafaxine XR (EFFEXOR-XR) 150 MG 24 hr capsule Take 1 capsule (150 mg total) by mouth daily. 07/02/17   Rainey Pines, MD  Vitamin D, Ergocalciferol, (DRISDOL) 50000 units CAPS capsule Take 1 capsule (50,000 Units total) by mouth every 7 (seven) days. 07/23/17   Dennard Nip D, MD    Allergies as of 08/06/2017 - Review Complete 07/24/2017  Allergen Reaction Noted  . Tetracycline Swelling 04/27/2008  . Meperidine Nausea And Vomiting 04/20/2009    Family History  Problem Relation Age of Onset  . Anxiety disorder Mother   . Paranoid behavior Mother   . Hypertension Mother   . Heart failure Mother   . Stroke Mother   . Dementia Mother   . High Cholesterol Mother   . Diabetes Mother   . Hyperlipidemia Mother   . Heart disease Mother   . Depression Mother   . Hypertension Father     . High Cholesterol Father   . Mood Disorder Sister   . Stroke Sister   . Anxiety disorder Maternal Aunt   . Drug abuse Cousin   . Tuberculosis Paternal Grandfather     Social History   Social History  . Marital status: Married    Spouse name: N/A  . Number of children: 0  . Years of education: BS in education   Occupational History  .  Retired Education officer, [employer]    Social History Main Topics  . Smoking status: Former Smoker    Types: Cigarettes    Quit date: 04/13/1997  . Smokeless tobacco: Never Used  . Alcohol use 0.6 oz/week    1 Glasses of wine per week     Comment: 2-3 glasses of wine per month  . Drug use: No  . Sexual activity: No   Other Topics Concern  . Not on file   Social History Narrative   Lives in [place] with spouse.  No children.   Retired first [employer] for over 30 years ([place] for 10 years and then in [place] for over 20 years).   Left-handed       Review of Systems: See HPI, otherwise negative ROS  Physical Exam: BP (!) 140/104   Pulse 70   Temp 97.7 F (36.5 C)   Ht 5' 3"  (1.6 m)   Wt 170 lb (77.1 kg)   SpO2 99%   BMI 30.11 kg/m  General:   Alert,  pleasant and cooperative in NAD Head:  Normocephalic and atraumatic. Neck:  Supple; no masses or thyromegaly. Lungs:  Clear throughout to auscultation.    Heart:  Regular rate and rhythm. Abdomen:  Soft, nontender and nondistended. Normal bowel sounds, without guarding, and without rebound.   Neurologic:  Alert and  oriented x4;  grossly normal neurologically.  Impression/Plan: Melanie Ray is here for an colonoscopy to be performed for Screening colonoscopy average risk    Risks, benefits, limitations, and alternatives regarding  colonoscopy have been reviewed with the patient.  Questions have been answered.  All parties agreeable.   Jonathon Bellows, MD  08/13/2017, 10:50 AM

## 2017-08-13 NOTE — Anesthesia Post-op Follow-up Note (Signed)
Anesthesia QCDR form completed.        

## 2017-08-13 NOTE — Op Note (Signed)
[facility] Patient Name: Melanie Ray Procedure Date: 08/13/2017 10:53 AM MRN: [id number] Account #: [id number] Date of Birth: 1951-03-14 Admit Type: Outpatient Age: 66 Room: [facility] ENDO ROOM 1 Gender: Female Note Status: Finalized Procedure:            Colonoscopy Indications:          Screening for colorectal malignant neoplasm Providers:            Jonathon Bellows MD, MD Referring MD:         Angela Adam. Caryl Bis (Referring MD) Medicines:            Monitored Anesthesia Care Complications:        No immediate complications. Procedure:            Pre-Anesthesia Assessment:                       - Prior to the procedure, a History and Physical was                        performed, and patient medications, allergies and                        sensitivities were reviewed. The patient's tolerance of                        previous anesthesia was reviewed.                       - The risks and benefits of the procedure and the                        sedation options and risks were discussed with the                        patient. All questions were answered and informed                        consent was obtained.                       - ASA Grade Assessment: III - A patient with severe                        systemic disease.                       After obtaining informed consent, the colonoscope was                        passed under direct vision. Throughout the procedure,                        the patient's blood pressure, pulse, and oxygen                        saturations were monitored continuously. The                        Colonoscope was introduced through the anus and  advanced to the the cecum, identified by the                        appendiceal orifice, IC valve and transillumination.                        The colonoscopy was performed with ease. The patient                        tolerated the procedure well.  The quality of the bowel                        preparation was poor. Findings:      Copious quantities of solid stool was found in the entire colon,       precluding visualization. Impression:           - Preparation of the colon was poor.                       - Stool in the entire examined colon.                       - No specimens collected. Recommendation:       - Discharge patient to home (with escort).                       - Resume previous diet.                       - Continue present medications.                       - Repeat colonoscopy in 4 weeks because the bowel                        preparation was suboptimal. Procedure Code(s):    --- Professional ---                       X8338, Colorectal cancer screening; colonoscopy on                        individual not meeting criteria for high risk Diagnosis Code(s):    --- Professional ---                       Z12.11, Encounter for screening for malignant neoplasm                        of colon CPT copyright 2016 American Medical Association. All rights reserved. The codes documented in this report are preliminary and upon coder review may  be revised to meet current compliance requirements. Jonathon Bellows, MD Jonathon Bellows MD, MD 08/13/2017 11:08:29 AM This report has been signed electronically. Number of Addenda: 0 Note Initiated On: 08/13/2017 10:53 AM Scope Withdrawal Time: 0 hours 1 minute 41 seconds  Total Procedure Duration: 0 hours 6 minutes 52 seconds       [facility]

## 2017-08-13 NOTE — Anesthesia Procedure Notes (Signed)
Performed by: Lance Muss Pre-anesthesia Checklist: Patient identified, Emergency Drugs available, Suction available, Timeout performed and Patient being monitored Patient Re-evaluated:Patient Re-evaluated prior to induction Oxygen Delivery Method: Nasal cannula Induction Type: IV induction Ventilation: Nasal airway inserted- appropriate to patient size

## 2017-08-14 ENCOUNTER — Encounter: Payer: Self-pay | Admitting: Gastroenterology

## 2017-08-14 ENCOUNTER — Telehealth: Payer: Self-pay | Admitting: Gastroenterology

## 2017-08-14 DIAGNOSIS — Z1211 Encounter for screening for malignant neoplasm of colon: Secondary | ICD-10-CM

## 2017-08-14 NOTE — Telephone Encounter (Signed)
Patient called and needs to r/s her procedure as she didn't clean out properly.

## 2017-08-14 NOTE — Anesthesia Postprocedure Evaluation (Signed)
Anesthesia Post Note  Patient: Melanie Ray  Procedure(s) Performed: COLONOSCOPY WITH PROPOFOL (N/A )  Patient location during evaluation: PACU Anesthesia Type: General Level of consciousness: awake and alert Pain management: pain level controlled Vital Signs Assessment: post-procedure vital signs reviewed and stable Respiratory status: spontaneous breathing, nonlabored ventilation, respiratory function stable and patient connected to nasal cannula oxygen Cardiovascular status: blood pressure returned to baseline and stable Postop Assessment: no apparent nausea or vomiting Anesthetic complications: no     Last Vitals:  Vitals:   08/13/17 1132 08/13/17 1142  BP: (!) 175/86 (!) 176/85  Pulse: 67 65  Resp: 19 (!) 22  Temp:    SpO2: 94% 100%    Last Pain:  Vitals:   08/13/17 1112  TempSrc: Tympanic                 Molli Barrows

## 2017-08-15 ENCOUNTER — Other Ambulatory Visit: Payer: Self-pay

## 2017-08-15 DIAGNOSIS — Z1212 Encounter for screening for malignant neoplasm of rectum: Principal | ICD-10-CM

## 2017-08-15 DIAGNOSIS — Z1211 Encounter for screening for malignant neoplasm of colon: Secondary | ICD-10-CM

## 2017-08-15 MED ORDER — NA SULFATE-K SULFATE-MG SULF 17.5-3.13-1.6 GM/177ML PO SOLN: 1.0000 | Freq: Once | ORAL | 0 refills | Status: AC

## 2017-08-15 NOTE — Telephone Encounter (Signed)
Scheduled another colonoscopy procedure due to poor bowel prep.

## 2017-08-19 ENCOUNTER — Other Ambulatory Visit (HOSPITAL_COMMUNITY): Payer: Self-pay

## 2017-08-19 MED ORDER — LORAZEPAM 0.5 MG PO TABS: 0.5000 mg | ORAL_TABLET | Freq: Every day | ORAL | 0 refills | Status: DC

## 2017-08-19 NOTE — Telephone Encounter (Signed)
I spoke to patient and she stated that when she was there for her appointment she thought that she had another refill on her bottle, she did not and that is why she is asking for a refill. Par Dr. Lovena Le I called in a 30 day supply to the patients pharmacy and I called the patient to let her know.

## 2017-08-19 NOTE — Telephone Encounter (Signed)
pt called again today. message have been sent to [facility] and [place]. is it possible for a doctor in [place] to approve this or at least give patient enough to do until dr. Gretel Acre is back in the office.  Pt needs refill on her medication lorazepam.  Please see pervious messages.

## 2017-08-29 ENCOUNTER — Telehealth: Payer: Self-pay | Admitting: Gastroenterology

## 2017-08-29 NOTE — Telephone Encounter (Signed)
Patient called and feels she needs to cancel her procedure due to the fact she didn't properly clean out and [facility] will not pay for another. Please call patient.

## 2017-09-02 NOTE — Telephone Encounter (Signed)
Pt will call back to schedule AWV °

## 2017-09-03 ENCOUNTER — Encounter: Admission: RE | Payer: Self-pay | Source: Ambulatory Visit

## 2017-09-03 ENCOUNTER — Ambulatory Visit: Admission: RE | Admit: 2017-09-03 | Payer: Medicare Other | Source: Ambulatory Visit | Admitting: Gastroenterology

## 2017-09-03 SURGERY — COLONOSCOPY WITH PROPOFOL
Anesthesia: General

## 2017-09-16 ENCOUNTER — Ambulatory Visit: Payer: Medicare Other | Admitting: Psychiatry

## 2017-09-16 ENCOUNTER — Ambulatory Visit (INDEPENDENT_AMBULATORY_CARE_PROVIDER_SITE_OTHER): Payer: Medicare Other | Admitting: Physician Assistant

## 2017-09-16 VITALS — BP 149/70 | HR 76 | Ht 63.0 in | Wt 173.0 lb

## 2017-09-16 DIAGNOSIS — Z6830 Body mass index (BMI) 30.0-30.9, adult: Secondary | ICD-10-CM | POA: Diagnosis not present

## 2017-09-16 DIAGNOSIS — D508 Other iron deficiency anemias: Secondary | ICD-10-CM

## 2017-09-16 DIAGNOSIS — E669 Obesity, unspecified: Secondary | ICD-10-CM | POA: Diagnosis not present

## 2017-09-16 DIAGNOSIS — E559 Vitamin D deficiency, unspecified: Secondary | ICD-10-CM | POA: Diagnosis not present

## 2017-09-16 MED ORDER — VITAMIN D (ERGOCALCIFEROL) 1.25 MG (50000 UNIT) PO CAPS
50000.0000 [IU] | ORAL_CAPSULE | ORAL | 0 refills | Status: DC
Start: 2017-09-16 — End: 2017-10-23

## 2017-09-16 MED ORDER — FERROUS SULFATE 325 (65 FE) MG PO TABS: 325.0000 mg | ORAL_TABLET | Freq: Every day | ORAL | 0 refills | Status: DC

## 2017-09-17 NOTE — Progress Notes (Signed)
Office: [phone]  /  Fax: [phone]   HPI:   Chief Complaint: OBESITY Melanie Ray is here to discuss her progress with her obesity treatment plan. She is on the Pescatarian eating plan and is following her eating plan approximately 60 % of the time. She states she is exercising 0 minutes 0 times per week. Sally had increased celebration eating. She is motivated to get back on track and continue with weight loss. She would like more variety with her meals.  Her weight is 173 lb (78.5 kg) today and has gained 5 pounds since her last visit. She has lost 4 lbs since starting treatment with [name].  Vitamin D deficiency Melanie Ray has a diagnosis of vitamin D deficiency. She is currently taking prescription Vit D and denies nausea, vomiting or muscle weakness.  Iron Deficiency Anemia Melanie Ray has a diagnosis of iron deficiency anemia.  She is on prescription iron supplementation. She denies chest pain or dyspnea.   ALLERGIES: Allergies  Allergen Reactions  . Tetracycline Swelling  . Meperidine Nausea And Vomiting    Nausea    MEDICATIONS: Current Outpatient Medications on File Prior to Visit  Medication Sig Dispense Refill  . allopurinol (ZYLOPRIM) 100 MG tablet     . aspirin EC 81 MG EC tablet Take 1 tablet (81 mg total) by mouth daily.    [name] atorvastatin (LIPITOR) 80 MG tablet     . colchicine 0.6 MG tablet Take 1 tablet (0.6 mg total) by mouth daily. 90 tablet 1  . dorzolamide-timolol (COSOPT) 22.3-6.8 MG/ML ophthalmic solution Place 1 drop into both eyes 2 (two) times daily.     . hydrochlorothiazide (HYDRODIURIL) 25 MG tablet Take 0.5 Tabs by Mouth Once a Day.    . isosorbide mononitrate (IMDUR) 30 MG 24 hr tablet Take 1 tablet (30 mg total) by mouth daily. 30 tablet 11  . LORazepam (ATIVAN) 0.5 MG tablet Take 1 tablet (0.5 mg total) by mouth at bedtime. 30 tablet 0  . losartan (COZAAR) 50 MG tablet Take 50 mg by mouth daily.    . meclizine (ANTIVERT) 25 MG tablet     . metFORMIN  (GLUCOPHAGE) 500 MG tablet TAKE ONE TABLET BY MOUTH TWICE DAILY WITH MEALS 180 tablet 3  . neomycin-polymyxin b-dexamethasone (MAXITROL) 3.5-10000-0.1 OINT     . nitroGLYCERIN (NITROSTAT) 0.4 MG SL tablet Place 1 tablet (0.4 mg total) under the tongue every 5 (five) minutes x 3 doses as needed for chest pain (Do not exceed 3 doses). 25 tablet 2  . ondansetron (ZOFRAN) 4 MG tablet Take 1 tablet (4 mg total) by mouth every 8 (eight) hours as needed for nausea or vomiting. 20 tablet 0  . ondansetron (ZOFRAN) 4 MG tablet TAKE 1 TABLET BY MOUTH ONCE DAILY AS NEEDED FOR NAUSEA AND VOMITING 20 tablet 1  . traZODone (DESYREL) 100 MG tablet Take 1 tablet (100 mg total) by mouth at bedtime as needed for sleep. Pt has supply 30 tablet 1  . venlafaxine XR (EFFEXOR-XR) 150 MG 24 hr capsule Take 1 capsule (150 mg total) by mouth daily. 30 capsule 3  . carvedilol (COREG) 12.5 MG tablet Take 1 tablet (12.5 mg total) by mouth as directed. 1 tablet in the AM and 1 and 1/2 tabs in the PM x 1 week; then increase to 1 1/2 tabs twice daily (Patient taking differently: Take 12.5 mg by mouth daily. 1 tablet in the AM and 1 and 1/2 tabs in the PM x 1 week; then increase to 1  1/2 tabs twice daily) 180 tablet 3  . furosemide (LASIX) 40 MG tablet Take 1 tablet (40 mg total) by mouth as directed. 40 mg on Mon, Wed and Fri's; all other days 20 mg 90 tablet 3  . spironolactone (ALDACTONE) 25 MG tablet Take 0.5 tablets (12.5 mg total) by mouth daily. 45 tablet 11   No current facility-administered medications on file prior to visit.     PAST MEDICAL HISTORY: Past Medical History:  Diagnosis Date  . Anemia   . Anxiety   . Back pain   . CAD (coronary artery disease) 04/11/2016   S/p ant STEMI 5/17: LHC >> LAD proximal 80%, mid 80%, distal 50%, ostial D1 60%; LCx with LPDA lesion 30%; RCA Mild calcification with no significant stenosis in a medium caliber, nondominant RCA; LVEF is estimated at 45% with inferoapical and lateral wall  akinesis >> PCI: PCI: 3.5 x 24 mm Promus DES to prox LAD, 2.5 x 12 mm Promus DES to mid LAD.   [name] Chest pain   . Chronic systolic CHF (congestive heart failure) ([place]) 03/21/2016   Echo 01/30/17: Diff HK, mild focal basal septal hypertrophy, EF 30-35, mild AI, MAC, mild MR // Echo 06/08/16: Mild focal basal septal hypertrophy, EF 25-30%, diff HK, ant-septal AK, Gr 1 DD, mild AI, MAC, mild MR, PASP 37 mmHg // Echo 03/18/16: EF 30-35%, ant-septal AK, Gr 1 DD, mild MR, severe LAE.  [name] Constipation   . Depression   . Diabetes mellitus type 2 in obese ([place])   . Dizziness   . Glaucoma   . Gout   . Heart disease   . Heartburn   . History of acute anterior wall MI 03/17/2016  . History of heart attack   . Hyperlipemia   . Hypertension   . Ischemic cardiomyopathy 10/02/2016   Refused ICD  . Joint pain   . Left bundle branch block   . Myocardial infarction ([place])   . Nausea   . Sleep apnea   . SOB (shortness of breath)   . Swelling    feet or legs    PAST SURGICAL HISTORY: Past Surgical History:  Procedure Laterality Date  . APPENDECTOMY    . SMALL BOWEL REPAIR    . TONSILLECTOMY    . UTERINE FIBROID SURGERY      SOCIAL HISTORY: Social History   Tobacco Use  . Smoking status: Former Smoker    Types: Cigarettes    Last attempt to quit: 04/13/1997    Years since quitting: 20.4  . Smokeless tobacco: Never Used  Substance Use Topics  . Alcohol use: Yes    Alcohol/week: 0.6 oz    Types: 1 Glasses of wine per week    Comment: 2-3 glasses of wine per month  . Drug use: No    FAMILY HISTORY: Family History  Problem Relation Age of Onset  . Anxiety disorder Mother   . Paranoid behavior Mother   . Hypertension Mother   . Heart failure Mother   . Stroke Mother   . Dementia Mother   . High Cholesterol Mother   . Diabetes Mother   . Hyperlipidemia Mother   . Heart disease Mother   . Depression Mother   . Hypertension Father   . High Cholesterol Father   . Mood Disorder Sister   .  Stroke Sister   . Anxiety disorder Maternal Aunt   . Drug abuse Cousin   . Tuberculosis Paternal Grandfather     ROS: Review of  Systems  Constitutional: Negative for weight loss.  Respiratory: Negative for shortness of breath.   Cardiovascular: Negative for chest pain.  Gastrointestinal: Negative for nausea and vomiting.  Musculoskeletal:       Negative muscle weakness    PHYSICAL EXAM: Blood pressure (!) 149/70, pulse 76, height 5' 3"  (1.6 m), weight 173 lb (78.5 kg), SpO2 99 %. Body mass index is 30.65 kg/m. Physical Exam  Constitutional: She is oriented to person, place, and time. She appears well-developed and well-nourished.  Cardiovascular: Normal rate.  Pulmonary/Chest: Effort normal.  Musculoskeletal: Normal range of motion.  Neurological: She is oriented to person, place, and time.  Skin: Skin is warm and dry.  Psychiatric: She has a normal mood and affect. Her behavior is normal.  Vitals reviewed.   RECENT LABS AND TESTS: BMET    Component Value Date/Time   NA 140 06/03/2017 1036   K 4.7 06/03/2017 1036   CL 104 06/03/2017 1036   CO2 20 06/03/2017 1036   GLUCOSE 78 06/03/2017 1036   GLUCOSE 107 (H) 01/03/2017 0947   BUN 20 06/03/2017 1036   CREATININE 0.80 06/03/2017 1036   CREATININE 1.12 (H) 10/16/2016 1412   CALCIUM 10.1 06/03/2017 1036   GFRNONAA 77 06/03/2017 1036   GFRAA 88 06/03/2017 1036   Lab Results  Component Value Date   HGBA1C 6.1 (H) 06/03/2017   HGBA1C 6.5 01/03/2017   HGBA1C 6.2 (H) 03/18/2016   HGBA1C 6.8 (H) 07/28/2015   Lab Results  Component Value Date   INSULIN 37.0 (H) 06/03/2017   CBC    Component Value Date/Time   WBC 6.8 06/03/2017 1036   WBC 6.8 01/03/2017 0947   RBC 4.01 06/03/2017 1036   RBC 4.52 01/03/2017 0947   HGB 10.9 (L) 06/03/2017 1036   HCT 33.7 (L) 06/03/2017 1036   PLT 387.0 01/03/2017 0947   MCV 84 06/03/2017 1036   MCH 27.2 06/03/2017 1036   MCH 27.1 11/22/2016 1138   MCHC 32.3 06/03/2017 1036    MCHC 33.0 01/03/2017 0947   RDW 14.5 06/03/2017 1036   LYMPHSABS 2.1 06/03/2017 1036   EOSABS 0.1 06/03/2017 1036   BASOSABS 0.0 06/03/2017 1036   Iron/TIBC/Ferritin/ %Sat No results found for: IRON, TIBC, FERRITIN, IRONPCTSAT Lipid Panel     Component Value Date/Time   CHOL 124 03/21/2017 1022   TRIG 114 03/21/2017 1022   HDL 36 (L) 03/21/2017 1022   CHOLHDL 3.4 03/21/2017 1022   CHOLHDL 4 01/03/2017 0947   VLDL 22.6 01/03/2017 0947   LDLCALC 65 03/21/2017 1022   Hepatic Function Panel     Component Value Date/Time   PROT 7.2 06/03/2017 1036   ALBUMIN 4.4 06/03/2017 1036   AST 17 06/03/2017 1036   ALT 13 06/03/2017 1036   ALKPHOS 126 (H) 06/03/2017 1036   BILITOT <0.2 06/03/2017 1036      Component Value Date/Time   TSH 0.934 06/03/2017 1036   TSH 1.02 01/03/2017 0947   TSH 0.589 03/18/2016 0158    ASSESSMENT AND PLAN: Vitamin D deficiency - Plan: Vitamin D, Ergocalciferol, (DRISDOL) 50000 units CAPS capsule  Other iron deficiency anemia - Plan: ferrous sulfate 325 (65 FE) MG tablet  Class 1 obesity with serious comorbidity and body mass index (BMI) of 30.0 to 30.9 in adult, unspecified obesity type  PLAN:  Vitamin D Deficiency Melanie Ray was informed that low vitamin D levels contributes to fatigue and are associated with obesity, breast, and colon cancer. Melanie Ray agrees to continue taking prescription Vit D @  50,000 IU every week #4 and we will refill for 1 month and will follow up for routine testing of vitamin D, at least 2-3 times per year. She was informed of the risk of over-replacement of vitamin D and agrees to not increase her dose unless he discusses this with [name] first. Melanie Ray agrees to follow up with our clinic in 2 to 3 weeks.  Iron Deficiency Anemia The diagnosis of Iron deficiency anemia was discussed with Melanie Ray and was explained in detail. She was given suggestions of iron rich foods. Melanie Ray agrees to continue taking ferrous sulfate 325 mg q AM #30 and we  will refill for 1 month. Melanie Ray agrees to follow up with our clinic in 2 to 3 weeks.  Obesity Melanie Ray is currently in the action stage of change. As such, her goal is to continue with weight loss efforts She has agreed to change to keep a food journal with 1100-1200 calories and 85 grams of protein daily Melanie Ray has been instructed to work up to a goal of 150 minutes of combined cardio and strengthening exercise per week for weight loss and overall health benefits. We discussed the following Behavioral Modification Strategies today: increasing lean protein intake and keep a strict food journal   Maybelline has agreed to follow up with our clinic in 2 to 3 weeks. She was informed of the importance of frequent follow up visits to maximize her success with intensive lifestyle modifications for her multiple health conditions.  Melanie Ray, Melanie Ray, am acting as transcriptionist for Melanie Duverney, Melanie Ray  Melanie Ray have reviewed the above documentation for accuracy and completeness, and Melanie Ray agree with the above. -Melanie Duverney, Melanie Ray  Melanie Ray have reviewed the above note and agree with the plan. -Melanie Nip, Melanie Ray      Today's visit was # 5 out of 22.  Starting weight: 177 lbs Starting date: 06/03/17 Today's weight : 173 lbs  Today's date: 09/16/2017 Total lbs lost to date: 4 (Patients must lose 7 lbs in the first 6 months to continue with counseling)   ASK: We discussed the diagnosis of obesity with Melanie Ray today and Melanie Ray agreed to give [name] permission to discuss obesity behavioral modification therapy today.  ASSESS: Melanie Ray has the diagnosis of obesity and her BMI today is 30.65 Melanie Ray is in the action stage of change   ADVISE: Melanie Ray was educated on the multiple health risks of obesity as well as the benefit of weight loss to improve her health. She was advised of the need for long term treatment and the importance of lifestyle modifications.  AGREE: Multiple dietary modification options and  treatment options were discussed and  Melanie Ray agreed to keep a food journal with 1100-1200 calories and 85 grams of protein daily We discussed the following Behavioral Modification Strategies today: increasing lean protein intake and keep a strict food journal

## 2017-09-19 ENCOUNTER — Ambulatory Visit (INDEPENDENT_AMBULATORY_CARE_PROVIDER_SITE_OTHER): Payer: Medicare Other | Admitting: Physician Assistant

## 2017-09-23 ENCOUNTER — Other Ambulatory Visit: Payer: Self-pay | Admitting: Family Medicine

## 2017-09-23 ENCOUNTER — Other Ambulatory Visit: Payer: Self-pay | Admitting: Physician Assistant

## 2017-09-23 DIAGNOSIS — I10 Essential (primary) hypertension: Secondary | ICD-10-CM

## 2017-09-23 DIAGNOSIS — I5020 Unspecified systolic (congestive) heart failure: Secondary | ICD-10-CM

## 2017-09-23 NOTE — Telephone Encounter (Signed)
Request for refill that is not on her chart. Can't not find where it was discontinued.

## 2017-09-23 NOTE — Telephone Encounter (Unsigned)
Copied from [place] [id number]. Topic: General - Other >> Sep 23, 2017  9:29 AM Scherrie Gerlach wrote: Reason for CRM: pt request refill of atorvastatin (LIPITOR) 80 MG tablet  [place], [place] [phone] (Phone) [phone] (Fax)  Pt states she is going out of town in the morning and needs asap

## 2017-09-23 NOTE — Telephone Encounter (Signed)
Copied from [place] [id number]. Topic: General - Other >> Sep 23, 2017  9:29 AM Scherrie Gerlach wrote: Reason for CRM: pt request refill of atorvastatin (LIPITOR) 80 MG tablet  [facility], [place] [phone] (Phone) [phone] (Fax)  Pt states she is going out of town in the morning and needs asap  >> Sep 23, 2017  2:38 PM Darl Householder, RMA wrote: Pt states she is leaving out of town in the morning and is wanting to know if medication can be refilled today, this is pt 2nd time calling

## 2017-09-24 NOTE — Telephone Encounter (Signed)
°*  STAT* If patient is at the pharmacy, call can be transferred to refill team.   1. Which medications need to be refilled? (please list name of each medication and dose if known) Spironolactone  2. Which pharmacy/location (including street and city if local pharmacy) is medication to be sent to?[facility] [phone]  3. Do they need a 30 day or 90 day supply?45*

## 2017-09-24 NOTE — Telephone Encounter (Signed)
Last OV with Dr.Cook 05/17/17 last filed under historical

## 2017-09-25 NOTE — Telephone Encounter (Signed)
Patient is scheduled for appmt 

## 2017-09-25 NOTE — Telephone Encounter (Signed)
Sent in refill x 1 since has been on medication regularly.  Please notify pt and schedule f/u appt with PCP.

## 2017-10-07 ENCOUNTER — Ambulatory Visit (INDEPENDENT_AMBULATORY_CARE_PROVIDER_SITE_OTHER): Payer: Medicare Other | Admitting: Physician Assistant

## 2017-10-07 DIAGNOSIS — Z23 Encounter for immunization: Secondary | ICD-10-CM | POA: Diagnosis not present

## 2017-10-17 ENCOUNTER — Telehealth: Payer: Self-pay | Admitting: Cardiovascular Disease

## 2017-10-17 ENCOUNTER — Other Ambulatory Visit: Payer: Self-pay | Admitting: Cardiology

## 2017-10-17 DIAGNOSIS — I5022 Chronic systolic (congestive) heart failure: Secondary | ICD-10-CM

## 2017-10-17 DIAGNOSIS — I10 Essential (primary) hypertension: Secondary | ICD-10-CM

## 2017-10-17 MED ORDER — FUROSEMIDE 40 MG PO TABS: 40.0000 mg | ORAL_TABLET | ORAL | 1 refills | Status: DC

## 2017-10-17 NOTE — Telephone Encounter (Signed)
Pt's medication was sent to pt's pharmacy as requested. Confirmation received.  °

## 2017-10-17 NOTE — Telephone Encounter (Signed)
New message      *STAT* If patient is at the pharmacy, call can be transferred to refill team.   1. Which medications need to be refilled? (please list name of each medication and dose if known)  furosemide (LASIX) 40 MG tablet(Expired) Take 1 tablet (40 mg total) by mouth as directed. 40 mg on Mon, Wed and Fri's; all other days 20 mg     2. Which pharmacy/location (including street and city if local pharmacy) is medication to be sent to  [address] [place]   3. Do they need a 30 day or 90 day supply?  [place]

## 2017-10-23 ENCOUNTER — Ambulatory Visit
Admission: EM | Admit: 2017-10-23 | Discharge: 2017-10-23 | Disposition: A | Discharge status: Home / Self Care | Payer: Medicare Other | Attending: Family Medicine | Admitting: Family Medicine

## 2017-10-23 ENCOUNTER — Other Ambulatory Visit: Payer: Self-pay

## 2017-10-23 ENCOUNTER — Encounter: Payer: Self-pay | Admitting: Emergency Medicine

## 2017-10-23 DIAGNOSIS — E669 Obesity, unspecified: Secondary | ICD-10-CM | POA: Diagnosis not present

## 2017-10-23 DIAGNOSIS — I255 Ischemic cardiomyopathy: Secondary | ICD-10-CM | POA: Diagnosis not present

## 2017-10-23 DIAGNOSIS — Z87891 Personal history of nicotine dependence: Secondary | ICD-10-CM | POA: Insufficient documentation

## 2017-10-23 DIAGNOSIS — D509 Iron deficiency anemia, unspecified: Secondary | ICD-10-CM | POA: Insufficient documentation

## 2017-10-23 DIAGNOSIS — Z7982 Long term (current) use of aspirin: Secondary | ICD-10-CM | POA: Insufficient documentation

## 2017-10-23 DIAGNOSIS — M8580 Other specified disorders of bone density and structure, unspecified site: Secondary | ICD-10-CM | POA: Diagnosis not present

## 2017-10-23 DIAGNOSIS — I2510 Atherosclerotic heart disease of native coronary artery without angina pectoris: Secondary | ICD-10-CM | POA: Insufficient documentation

## 2017-10-23 DIAGNOSIS — Z881 Allergy status to other antibiotic agents status: Secondary | ICD-10-CM | POA: Insufficient documentation

## 2017-10-23 DIAGNOSIS — I5022 Chronic systolic (congestive) heart failure: Secondary | ICD-10-CM | POA: Insufficient documentation

## 2017-10-23 DIAGNOSIS — E785 Hyperlipidemia, unspecified: Secondary | ICD-10-CM | POA: Insufficient documentation

## 2017-10-23 DIAGNOSIS — H42 Glaucoma in diseases classified elsewhere: Secondary | ICD-10-CM | POA: Insufficient documentation

## 2017-10-23 DIAGNOSIS — E559 Vitamin D deficiency, unspecified: Secondary | ICD-10-CM | POA: Insufficient documentation

## 2017-10-23 DIAGNOSIS — M109 Gout, unspecified: Secondary | ICD-10-CM | POA: Diagnosis not present

## 2017-10-23 DIAGNOSIS — G4730 Sleep apnea, unspecified: Secondary | ICD-10-CM | POA: Diagnosis not present

## 2017-10-23 DIAGNOSIS — R42 Dizziness and giddiness: Secondary | ICD-10-CM | POA: Insufficient documentation

## 2017-10-23 DIAGNOSIS — I252 Old myocardial infarction: Secondary | ICD-10-CM | POA: Insufficient documentation

## 2017-10-23 DIAGNOSIS — E1139 Type 2 diabetes mellitus with other diabetic ophthalmic complication: Secondary | ICD-10-CM | POA: Insufficient documentation

## 2017-10-23 DIAGNOSIS — F329 Major depressive disorder, single episode, unspecified: Secondary | ICD-10-CM | POA: Diagnosis not present

## 2017-10-23 DIAGNOSIS — I447 Left bundle-branch block, unspecified: Secondary | ICD-10-CM | POA: Diagnosis not present

## 2017-10-23 DIAGNOSIS — R531 Weakness: Secondary | ICD-10-CM | POA: Diagnosis not present

## 2017-10-23 DIAGNOSIS — I110 Hypertensive heart disease with heart failure: Secondary | ICD-10-CM | POA: Insufficient documentation

## 2017-10-23 DIAGNOSIS — Z7984 Long term (current) use of oral hypoglycemic drugs: Secondary | ICD-10-CM | POA: Insufficient documentation

## 2017-10-23 DIAGNOSIS — D508 Other iron deficiency anemias: Secondary | ICD-10-CM

## 2017-10-23 DIAGNOSIS — Z79899 Other long term (current) drug therapy: Secondary | ICD-10-CM | POA: Insufficient documentation

## 2017-10-23 DIAGNOSIS — Z780 Asymptomatic menopausal state: Secondary | ICD-10-CM | POA: Insufficient documentation

## 2017-10-23 DIAGNOSIS — Z6830 Body mass index (BMI) 30.0-30.9, adult: Secondary | ICD-10-CM | POA: Insufficient documentation

## 2017-10-23 LAB — CBC
HEMATOCRIT: 36 % (ref 35.0–47.0)
HEMOGLOBIN: 12 g/dL (ref 12.0–16.0)
MCH: 27.3 pg (ref 26.0–34.0)
MCHC: 33.4 g/dL (ref 32.0–36.0)
MCV: 81.8 fL (ref 80.0–100.0)
Platelets: 348 10*3/uL (ref 150–440)
RBC: 4.4 MIL/uL (ref 3.80–5.20)
RDW: 14.2 % (ref 11.5–14.5)
WBC: 9.3 10*3/uL (ref 3.6–11.0)

## 2017-10-23 LAB — IRON AND TIBC
Iron: 65 ug/dL (ref 28–170)
Saturation Ratios: 18 % (ref 10.4–31.8)
TIBC: 361 ug/dL (ref 250–450)
UIBC: 296 ug/dL

## 2017-10-23 LAB — FERRITIN: FERRITIN: 103 ng/mL (ref 11–307)

## 2017-10-23 MED ORDER — FERROUS SULFATE 325 (65 FE) MG PO TABS: 325.0000 mg | ORAL_TABLET | Freq: Every day | ORAL | 0 refills | Status: DC

## 2017-10-23 MED ORDER — METFORMIN HCL 500 MG PO TABS: 500.0000 mg | ORAL_TABLET | Freq: Two times a day (BID) | ORAL | 0 refills | Status: DC

## 2017-10-23 MED ORDER — VITAMIN D (ERGOCALCIFEROL) 1.25 MG (50000 UNIT) PO CAPS: 50000.0000 [IU] | ORAL_CAPSULE | ORAL | 0 refills | Status: DC

## 2017-10-23 MED ORDER — ONDANSETRON HCL 4 MG PO TABS: 4.0000 mg | ORAL_TABLET | Freq: Three times a day (TID) | ORAL | 0 refills | Status: DC | PRN

## 2017-10-23 NOTE — Discharge Instructions (Signed)
Meds as prescribed.  We will call with the remainder of the results.  Take care  Dr. Lacinda Axon

## 2017-10-23 NOTE — ED Provider Notes (Signed)
[facility]    CSN: [id number] Arrival date & time: 10/23/17  1518  History   Chief Complaint Chief Complaint  Patient presents with  . Dizziness   HPI  66 year old female with an extensive PMH including chronic systolic heart failure and coronary disease presents with dizziness.  Melanie Ray is a former patient of mine.  She has a long-standing history of chronic dizziness.  She has been evaluated by neurology and had a negative MRI.  She is also been to vestibular rehab.  She is also been evaluated and treated for sleep apnea.  She states that she recently saw a new physician and was told she was anemic and vitamin D deficient.  She was placed on supplementation.  I reviewed electronic medical record.  She had mild anemia in July of 10.9.  No iron, ferritin, TIBC was done.  Vitamin B12 normal.  Vitamin D was low at 20.9.  She was started on oral iron therapy as well as vitamin D therapy.  Patient states that she developed another bout of dizziness today.  Started this afternoon.  Described as a generalized weakness/fatigue.  No reports of vertiginous symptoms.  No known inciting factor.  No known exacerbating factors.  She states that she has been doing well since being placed on the supplementation outlined above.  Patient is in need of laboratory studies as well as refills on these medications.  Past Medical History:  Diagnosis Date  . Anemia   . Anxiety   . Back pain   . CAD (coronary artery disease) 04/11/2016   S/p ant STEMI 5/17: LHC >> LAD proximal 80%, mid 80%, distal 50%, ostial D1 60%; LCx with LPDA lesion 30%; RCA Mild calcification with no significant stenosis in a medium caliber, nondominant RCA; LVEF is estimated at 45% with inferoapical and lateral wall akinesis >> PCI: PCI: 3.5 x 24 mm Promus DES to prox LAD, 2.5 x 12 mm Promus DES to mid LAD.   [name] Chest pain   . Chronic systolic CHF (congestive heart failure) ([place]) 03/21/2016   Echo 01/30/17: Diff HK, mild  focal basal septal hypertrophy, EF 30-35, mild AI, MAC, mild MR // Echo 06/08/16: Mild focal basal septal hypertrophy, EF 25-30%, diff HK, ant-septal AK, Gr 1 DD, mild AI, MAC, mild MR, PASP 37 mmHg // Echo 03/18/16: EF 30-35%, ant-septal AK, Gr 1 DD, mild MR, severe LAE.  [name] Constipation   . Depression   . Diabetes mellitus type 2 in obese ([place])   . Dizziness   . Glaucoma   . Gout   . Heart disease   . Heartburn   . History of acute anterior wall MI 03/17/2016  . History of heart attack   . Hyperlipemia   . Hypertension   . Ischemic cardiomyopathy 10/02/2016   Refused ICD  . Joint pain   . Left bundle branch block   . Myocardial infarction ([place])   . Nausea   . Sleep apnea   . SOB (shortness of breath)   . Swelling    feet or legs    Patient Active Problem List   Diagnosis Date Noted  . Postmenopausal 07/02/2017  . Vitamin D deficiency 06/17/2017  . Class 1 obesity without serious comorbidity with body mass index (BMI) of 30.0 to 30.9 in adult 06/17/2017  . Great toe pain, left 05/17/2017  . Hot flashes 04/23/2017  . Dizziness 01/03/2017  . Ischemic cardiomyopathy 10/02/2016  . CAD (coronary artery disease) 04/11/2016  .  Chronic systolic CHF (congestive heart failure) ([place]) 03/21/2016  . History of acute anterior wall MI 03/18/2016  . DM type 2 (diabetes mellitus, type 2) ([place]) 08/03/2015  . Essential hypertension 07/28/2015  . HLD (hyperlipidemia) 07/28/2015  . Glaucoma 07/28/2015  . Depression 07/28/2015  . Anxiety 07/28/2015  . Insomnia 07/28/2015  . Obesity (BMI 30.0-34.9) 07/28/2015  . 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 Polyp of colon 04/27/2008  . Uterine leiomyoma 04/27/2008  . Generalized anxiety disorder 04/27/2008  . History of colonoscopy 04/27/2008  . Iron deficiency anemia, unspecified 04/27/2008  . Osteopenia 04/27/2008  . Other specified disorder of bladder 04/27/2008    Past Surgical History:  Procedure Laterality Date  . APPENDECTOMY     . CARDIAC CATHETERIZATION N/A 03/17/2016   Procedure: Left Heart Cath and Coronary Angiography;  Surgeon: Sherren Mocha, MD;  Location: [facility];  Service: Cardiovascular;  Laterality: N/A;  . CARDIAC CATHETERIZATION N/A 03/17/2016   Procedure: Coronary Stent Intervention;  Surgeon: Sherren Mocha, MD;  Location: [facility];  Service: Cardiovascular;  Laterality: N/A;  . COLONOSCOPY WITH PROPOFOL N/A 08/13/2017   Procedure: COLONOSCOPY WITH PROPOFOL;  Surgeon: Jonathon Bellows, MD;  Location: [facility];  Service: Gastroenterology;  Laterality: N/A;  . SMALL BOWEL REPAIR    . TONSILLECTOMY    . UTERINE FIBROID SURGERY      OB History    Gravida Para Term Preterm AB Living   _0 SAB TAB Ectopic Multiple Live Births                   Home Medications    Prior to Admission medications   Medication Sig Start Date End Date Taking? Authorizing Provider  aspirin EC 81 MG EC tablet Take 1 tablet (81 mg total) by mouth daily. 03/21/16  Yes Simmons, Brittainy M, PA-C  atorvastatin (LIPITOR) 80 MG tablet TAKE ONE TABLET BY MOUTH ONCE DAILY 09/25/17  Yes Einar Pheasant, MD  dorzolamide-timolol (COSOPT) 22.3-6.8 MG/ML ophthalmic solution Place 1 drop into both eyes 2 (two) times daily.  01/17/15  Yes [provider]  furosemide (LASIX) 40 MG tablet Take 1 tablet (40 mg total) by mouth as directed. 40 mg on Mon, Wed and Fri's; all other days 20 mg 10/17/17 01/15/18 Yes Sherren Mocha, MD  hydrochlorothiazide (HYDRODIURIL) 25 MG tablet Take 0.5 Tabs by Mouth Once a Day. 02/28/12  Yes [provider]  isosorbide mononitrate (IMDUR) 30 MG 24 hr tablet Take 1 tablet (30 mg total) by mouth daily. 10/17/16  Yes Sherren Mocha, MD  LORazepam (ATIVAN) 0.5 MG tablet Take 1 tablet (0.5 mg total) by mouth at bedtime. 08/19/17  Yes Clarene Reamer, MD  losartan (COZAAR) 50 MG tablet Take 50 mg by mouth daily.   Yes [provider]  spironolactone  (ALDACTONE) 25 MG tablet TAKE ONE-HALF TABLET BY MOUTH ONCE DAILY 09/24/17  Yes Richardson Dopp T, PA-C  venlafaxine XR (EFFEXOR-XR) 150 MG 24 hr capsule Take 1 capsule (150 mg total) by mouth daily. 07/02/17  Yes Rainey Pines, MD  allopurinol (ZYLOPRIM) 100 MG tablet  05/22/17   [provider]  carvedilol (COREG) 12.5 MG tablet Take 1 tablet (12.5 mg total) by mouth as directed. 1 tablet in the AM and 1 and 1/2 tabs in the PM x 1 week; then increase to 1 1/2 tabs twice daily Patient taking differently: Take 12.5 mg by mouth daily. 1 tablet in the AM and  1 and 1/2 tabs in the PM x 1 week; then increase to 1 1/2 tabs twice daily 03/29/17 06/27/17  Richardson Dopp T, PA-C  ferrous sulfate 325 (65 FE) MG tablet Take 1 tablet (325 mg total) by mouth daily with breakfast. 10/23/17   Coral Spikes, DO  metFORMIN (GLUCOPHAGE) 500 MG tablet Take 1 tablet (500 mg total) by mouth 2 (two) times daily with a meal. 10/23/17   Dorian Renfro, Barnie Del, DO  nitroGLYCERIN (NITROSTAT) 0.4 MG SL tablet Place 1 tablet (0.4 mg total) under the tongue every 5 (five) minutes x 3 doses as needed for chest pain (Do not exceed 3 doses). 05/23/17   Sherren Mocha, MD  ondansetron (ZOFRAN) 4 MG tablet Take 1 tablet (4 mg total) by mouth every 8 (eight) hours as needed for nausea or vomiting. 10/23/17   Coral Spikes, DO  traZODone (DESYREL) 100 MG tablet Take 1 tablet (100 mg total) by mouth at bedtime as needed for sleep. Pt has supply 03/11/17   Rainey Pines, MD  Vitamin D, Ergocalciferol, (DRISDOL) 50000 units CAPS capsule Take 1 capsule (50,000 Units total) by mouth every 7 (seven) days. 10/23/17   Coral Spikes, DO    Family History Family History  Problem Relation Age of Onset  . Anxiety disorder Mother   . Paranoid behavior Mother   . Hypertension Mother   . Heart failure Mother   . Stroke Mother   . Dementia Mother   . High Cholesterol Mother   . Diabetes Mother   . Hyperlipidemia Mother   . Heart disease Mother   .  Depression Mother   . Hypertension Father   . High Cholesterol Father   . Mood Disorder Sister   . Stroke Sister   . Anxiety disorder Maternal Aunt   . Drug abuse Cousin   . Tuberculosis Paternal Grandfather     Social History Social History   Tobacco Use  . Smoking status: Former Smoker    Types: Cigarettes    Last attempt to quit: 04/13/1997    Years since quitting: 20.5  . Smokeless tobacco: Never Used  Substance Use Topics  . Alcohol use: Yes    Alcohol/week: 0.6 oz    Types: 1 Glasses of wine per week    Comment: 2-3 glasses of wine per month  . Drug use: No     Allergies   Tetracycline and Meperidine   Review of Systems Review of Systems  Respiratory: Negative.   Cardiovascular: Negative.   Neurological: Positive for dizziness and weakness.    Physical Exam Triage Vital Signs ED Triage Vitals  Enc Vitals Group     BP 10/23/17 1529 (!) 122/57     Pulse Rate 10/23/17 1529 72     Resp 10/23/17 1529 16     Temp 10/23/17 1529 (!) 97.5 F (36.4 C)     Temp Source 10/23/17 1529 Oral     SpO2 10/23/17 1529 99 %     Weight 10/23/17 1530 170 lb (77.1 kg)     Height 10/23/17 1530 _0  (1.6 m)     Head Circumference --      Peak Flow --      Pain Score 10/23/17 1530 0     Pain Loc --      Pain Edu? --      Excl. in [place]? --     Updated Vital Signs BP (!) 122/57 (BP Location: Left Arm)   Pulse 72   Temp [name])  97.5 F (36.4 C) (Oral)   Resp 16   Ht _0  (1.6 m)   Wt 170 lb (77.1 kg)   SpO2 99%   BMI 30.11 kg/m     Physical Exam  Constitutional: She is oriented to person, place, and time. She appears well-developed. No distress.  HENT:  Head: Normocephalic and atraumatic.  Nose: Nose normal.  Cardiovascular: Normal rate and regular rhythm.  No murmur heard. Pulmonary/Chest: Effort normal and breath sounds normal. She has no wheezes. She has no rales.  Neurological: She is alert and oriented to person, place, and time.  Skin: Skin is warm. No rash  noted.  Psychiatric: She has a normal mood and affect. Her behavior is normal.  Vitals reviewed.  UC Treatments / Results  Labs (all labs ordered are listed, but only abnormal results are displayed) Labs Reviewed  CBC  IRON AND TIBC  FERRITIN    EKG  EKG Interpretation None       Radiology No results found.  Procedures Procedures (including critical care time)  Medications Ordered in UC Medications - No data to display   Initial Impression / Assessment and Plan / UC Course  I have reviewed the triage vital signs and the nursing notes.  Pertinent labs & imaging results that were available during my care of the patient were reviewed by me and considered in my medical decision making (see chart for details).    66 year old female with chronic dizziness presents with another bout of dizziness.  The etiology of her dizziness is unclear.  She has been evaluated by neurology and vestibular rehab.  She has been doing well until today.  She attributes this to being out of her medications.  Anemia now resolved. Awaiting Iron,TIBC and Ferritin.  I have refilled her iron and vitamin D.  Also refilled her metformin at her request.  Advised to follow-up closely with her primary care physician.  Final Clinical Impressions(s) / UC Diagnoses   ED Discharge Orders        Ordered    ferrous sulfate 325 (65 FE) MG tablet  Daily with breakfast     10/23/17 1559    metFORMIN (GLUCOPHAGE) 500 MG tablet  2 times daily with meals     10/23/17 1559    Vitamin D, Ergocalciferol, (DRISDOL) 50000 units CAPS capsule  Every 7 days     10/23/17 1559    ondansetron (ZOFRAN) 4 MG tablet  Every 8 hours PRN     10/23/17 1615     Controlled Substance Prescriptions [place] Controlled Substance Registry consulted? Not Applicable   Coral Spikes, DO 10/23/17 1639

## 2017-10-23 NOTE — ED Triage Notes (Signed)
Patient in today c/o dizziness, weakness and nausea. Patient has had this problem for several years and has been given iron and vitamin d. Patient states she ran out of these medications yesterday and today her symptoms worsened.

## 2017-10-25 ENCOUNTER — Telehealth: Payer: Self-pay

## 2017-10-25 ENCOUNTER — Other Ambulatory Visit: Payer: Self-pay | Admitting: Psychiatry

## 2017-10-25 DIAGNOSIS — F411 Generalized anxiety disorder: Secondary | ICD-10-CM

## 2017-10-25 MED ORDER — LORAZEPAM 0.5 MG PO TABS: 0.5000 mg | ORAL_TABLET | Freq: Every day | ORAL | 0 refills | Status: DC | PRN

## 2017-10-25 NOTE — Telephone Encounter (Signed)
pt called asked if she can get enough of the lorazepam to due until she sees you on 11-20-17. pt last seen dr Gretel Acre on  07-02-17   LORazepam (ATIVAN) 0.5 MG tablet  Medication  Date: 08/19/2017 Department: [facility] Ordering/Authorizing: Clarene Reamer, MD  Order Providers   Prescribing Provider Encounter Provider  Clarene Reamer, MD Dennie Maizes, CMA  Medication Detail    Disp Refills Start End   LORazepam (ATIVAN) 0.5 MG tablet 30 tablet 0 08/19/2017    Sig - Route: Take 1 tablet (0.5 mg total) by mouth at bedtime. - Oral   Class: Phone In

## 2017-10-25 NOTE — Telephone Encounter (Signed)
Pt was notified.  

## 2017-10-25 NOTE — Telephone Encounter (Signed)
Ativan script sent to pharmacy

## 2017-10-25 NOTE — Telephone Encounter (Signed)
pls let pt know ativan script has been sent to her pharmacy

## 2017-10-28 ENCOUNTER — Other Ambulatory Visit: Payer: Self-pay | Admitting: Physician Assistant

## 2017-10-28 DIAGNOSIS — I5022 Chronic systolic (congestive) heart failure: Secondary | ICD-10-CM

## 2017-10-30 ENCOUNTER — Telehealth: Payer: Self-pay | Admitting: Cardiovascular Disease

## 2017-10-30 NOTE — Telephone Encounter (Signed)
°*  STAT* If patient is at the pharmacy, call can be transferred to refill team.   1. Which medications need to be refilled? (please list name of each medication and dose if known)   Isosorbide mononitrate (IMDUR) 30mg  24 hr tablet   and Carvedilol (COREG) 12.5 MG tablet  2. Which pharmacy/location (including street and city if local pharmacy) is medication to be sent to? [facility] [address] on [address]   3. Do they need a 30 day or 90 day supply? [place]

## 2017-10-31 ENCOUNTER — Telehealth: Payer: Self-pay

## 2017-10-31 MED ORDER — CARVEDILOL 12.5 MG PO TABS: ORAL_TABLET | ORAL | 4 refills | Status: DC

## 2017-10-31 MED ORDER — ISOSORBIDE MONONITRATE ER 30 MG PO TB24: 30.0000 mg | ORAL_TABLET | Freq: Every day | ORAL | 4 refills | Status: DC

## 2017-10-31 NOTE — Telephone Encounter (Signed)
Pt's medication was sent to pt's pharmacy as requested. Confirmation received.  °

## 2017-10-31 NOTE — Telephone Encounter (Signed)
Pt calls back. I informed her of her normal iron studies and she can stop supplements. She verbalized understanding and will f/u as needed.

## 2017-11-20 ENCOUNTER — Ambulatory Visit (INDEPENDENT_AMBULATORY_CARE_PROVIDER_SITE_OTHER): Payer: Medicare Other | Admitting: Psychiatry

## 2017-11-20 DIAGNOSIS — G4733 Obstructive sleep apnea (adult) (pediatric): Secondary | ICD-10-CM

## 2017-11-20 DIAGNOSIS — F429 Obsessive-compulsive disorder, unspecified: Secondary | ICD-10-CM | POA: Diagnosis not present

## 2017-11-20 DIAGNOSIS — F331 Major depressive disorder, recurrent, moderate: Secondary | ICD-10-CM | POA: Diagnosis not present

## 2017-11-20 MED ORDER — VENLAFAXINE HCL ER 150 MG PO CP24: 150.0000 mg | ORAL_CAPSULE | Freq: Every day | ORAL | 0 refills | Status: DC

## 2017-11-20 MED ORDER — LORAZEPAM 0.5 MG PO TABS: 0.5000 mg | ORAL_TABLET | Freq: Every day | ORAL | 2 refills | Status: DC | PRN

## 2017-11-20 NOTE — Progress Notes (Signed)
[facility] OP Progress Note  11/21/2017 10:34 AM Melanie Ray  MRN:  [id number]  Chief Complaint:  Chief Complaint    Follow-up    ' I am ok."  HPI: Melanie Ray is a 67 year old African-American female who presented to the clinic today for a follow-up visit.  Melanie Ray has a history of major depressive disorder as well as obsessive-compulsive disorder.  She used to follow up with Dr. Gretel Acre in the past.  This is her first appointment with writer.  Melanie Ray reports that her mood symptoms are currently stable on the Effexor.  She does report some increased anxiety symptoms after Thanksgiving.  She reports that she had an altercation with her sister during Thanksgiving holiday and that kind of made her anxious the next few weeks.  She reports that she constantly worries about her sister who can really do mean things to others.  She however reports that she has been able to slowly separate herself from that and is coping better now.  Her sister lives in [place] and that also helps her to relax.  Reports she has been taking Ativan more than she used to before.  She reports she could go for months without taking an Ativan in the past but most recently she has been taking up to 2 Ativan per week.  She reports her providers tried to increase her Effexor in the past but she developed side effects.  She is currently tolerating the current dose well.  Melanie Ray reports some sleep issues.  She reports she snores a lot.  She reports she was diagnosed with obstructive sleep apnea and was asked to  use CPAP on a regular basis.  She however reports that she did not tolerate that well and developed extreme anxiety to the mask.  She reports she hence has not been using it.  She does report using trazodone occasionally.  She does report a history of OCD symptoms.  She reports that after the death of her sister-in-law she developed this extreme fear of dead people as well as objects that were somehow related to the dead person.  She  reports she went into a deep depression soon after her sister-in-law's death.  She reports that she became so afraid to go into her brother's house or anywhere closer to his house, could not step on grass, could not touch objects that were somehow connected to her sister-in-law.  She however currently reports she has been able to come out of it and her OCD symptoms have improved.  Her mother passed away in 03-Jan-2013.  She did not develop the same fear when her mother passed away.  She was also able to visit her father at that time even though he lived right next to her brother's house.    Discussed psychotherapy with patient for her relational stressors and her anxiety symptoms but she is not interested at this time.  She denies any suicidality at this time.  She denies any side effects to the medications that she is on.  She reports her husband is supportive. Visit Diagnosis:    ICD-10-CM   1. Major depressive disorder, recurrent episode, moderate (HCC) F33.1 venlafaxine XR (EFFEXOR-XR) 150 MG 24 hr capsule  2. Obsessive-compulsive disorder with good or fair insight F42.9 LORazepam (ATIVAN) 0.5 MG tablet  3. OSA (obstructive sleep apnea) G47.33     Past Psychiatric History: Patient reports a history of major depressive disorder, OCD as well as specific phobias in the past.  She reports she had several  suicide attempts by overdose in the past.  She reports she was admitted to the inpatient side in 1994 as well as in 2012 in [place].  She used to see a psychiatrist in [place] previously.  She relocated to [facility]  and  was seen by Dr. Gretel Acre.  Past Medical History:  Past Medical History:  Diagnosis Date  . Anemia   . Anxiety   . Back pain   . CAD (coronary artery disease) 04/11/2016   S/p ant STEMI 5/17: LHC >> LAD proximal 80%, mid 80%, distal 50%, ostial D1 60%; LCx with LPDA lesion 30%; RCA Mild calcification with no significant stenosis in a medium caliber, nondominant RCA; LVEF is  estimated at 45% with inferoapical and lateral wall akinesis >> PCI: PCI: 3.5 x 24 mm Promus DES to prox LAD, 2.5 x 12 mm Promus DES to mid LAD.   [name] Chest pain   . Chronic systolic CHF (congestive heart failure) ([place]) 03/21/2016   Echo 01/30/17: Diff HK, mild focal basal septal hypertrophy, EF 30-35, mild AI, MAC, mild MR // Echo 06/08/16: Mild focal basal septal hypertrophy, EF 25-30%, diff HK, ant-septal AK, Gr 1 DD, mild AI, MAC, mild MR, PASP 37 mmHg // Echo 03/18/16: EF 30-35%, ant-septal AK, Gr 1 DD, mild MR, severe LAE.  [name] Constipation   . Depression   . Diabetes mellitus type 2 in obese ([place])   . Dizziness   . Glaucoma   . Gout   . Heart disease   . Heartburn   . History of acute anterior wall MI 03/17/2016  . History of heart attack   . Hyperlipemia   . Hypertension   . Ischemic cardiomyopathy 10/02/2016   Refused ICD  . Joint pain   . Left bundle branch block   . Myocardial infarction ([place])   . Nausea   . Sleep apnea   . SOB (shortness of breath)   . Swelling    feet or legs    Past Surgical History:  Procedure Laterality Date  . APPENDECTOMY    . CARDIAC CATHETERIZATION N/A 03/17/2016   Procedure: Left Heart Cath and Coronary Angiography;  Surgeon: Sherren Mocha, MD;  Location: [facility];  Service: Cardiovascular;  Laterality: N/A;  . CARDIAC CATHETERIZATION N/A 03/17/2016   Procedure: Coronary Stent Intervention;  Surgeon: Sherren Mocha, MD;  Location: [facility];  Service: Cardiovascular;  Laterality: N/A;  . COLONOSCOPY WITH PROPOFOL N/A 08/13/2017   Procedure: COLONOSCOPY WITH PROPOFOL;  Surgeon: Jonathon Bellows, MD;  Location: [facility] ENDOSCOPY;  Service: Gastroenterology;  Laterality: N/A;  . SMALL BOWEL REPAIR    . TONSILLECTOMY    . UTERINE FIBROID SURGERY      Family Psychiatric History: Mother-mental health problems.  Family History:  Family History  Problem Relation Age of Onset  . Anxiety disorder Mother   . Paranoid behavior Mother   .  Hypertension Mother   . Heart failure Mother   . Stroke Mother   . Dementia Mother   . High Cholesterol Mother   . Diabetes Mother   . Hyperlipidemia Mother   . Heart disease Mother   . Depression Mother   . Hypertension Father   . High Cholesterol Father   . Mood Disorder Sister   . Stroke Sister   . Anxiety disorder Maternal Aunt   . Drug abuse Cousin   . Tuberculosis Paternal Grandfather     Social History: She is married.  Lives in [place].  She denies having  children.  She is a retired Radio producer Social History   Socioeconomic History  . Marital status: Married    Spouse name: Not on file  . Number of children: 0  . Years of education: BS in education  . Highest education level: Not on file  Social Needs  . Financial resource strain: Not on file  . Food insecurity - worry: Not on file  . Food insecurity - inability: Not on file  . Transportation needs - medical: Not on file  . Transportation needs - non-medical: Not on file  Occupational History  . Occupation:  Retired Education officer, museum  Tobacco Use  . Smoking status: Former Smoker    Types: Cigarettes    Last attempt to quit: 04/13/1997    Years since quitting: 20.6  . Smokeless tobacco: Never Used  Substance and Sexual Activity  . Alcohol use: Yes    Alcohol/week: 0.6 oz    Types: 1 Glasses of wine per week    Comment: 2-3 glasses of wine per month  . Drug use: No  . Sexual activity: No  Other Topics Concern  . Not on file  Social History Narrative   Lives in [place] with spouse.  No children.   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   Left-handed    Allergies:  Allergies  Allergen Reactions  . Tetracycline Swelling  . Meperidine Nausea And Vomiting    Nausea    Metabolic Disorder Labs: Lab Results  Component Value Date   HGBA1C 6.1 (H) 06/03/2017   MPG 131 03/18/2016   No results found for: PROLACTIN Lab Results   Component Value Date   CHOL 124 03/21/2017   TRIG 114 03/21/2017   HDL 36 (L) 03/21/2017   CHOLHDL 3.4 03/21/2017   VLDL 22.6 01/03/2017   LDLCALC 65 03/21/2017   LDLCALC 72 01/03/2017   Lab Results  Component Value Date   TSH 0.934 06/03/2017   TSH 1.02 01/03/2017    Therapeutic Level Labs: No results found for: LITHIUM No results found for: VALPROATE No components found for:  CBMZ  Current Medications: Current Outpatient Medications  Medication Sig Dispense Refill  . allopurinol (ZYLOPRIM) 100 MG tablet     . aspirin EC 81 MG EC tablet Take 1 tablet (81 mg total) by mouth daily.    [name] atorvastatin (LIPITOR) 80 MG tablet TAKE ONE TABLET BY MOUTH ONCE DAILY 90 tablet 0  . carvedilol (COREG) 12.5 MG tablet Take 1 1/2 tabs by mouth twice daily 90 tablet 4  . dorzolamide-timolol (COSOPT) 22.3-6.8 MG/ML ophthalmic solution Place 1 drop into both eyes 2 (two) times daily.     . ferrous sulfate 325 (65 FE) MG tablet Take 1 tablet (325 mg total) by mouth daily with breakfast. 90 tablet 0  . furosemide (LASIX) 40 MG tablet Take 1 tablet (40 mg total) by mouth as directed. 40 mg on Mon, Wed and Fri's; all other days 20 mg 30 tablet 1  . hydrochlorothiazide (HYDRODIURIL) 25 MG tablet Take 0.5 Tabs by Mouth Once a Day.    . isosorbide mononitrate (IMDUR) 30 MG 24 hr tablet Take 1 tablet (30 mg total) by mouth daily. 30 tablet 4  . LORazepam (ATIVAN) 0.5 MG tablet Take 1 tablet (0.5 mg total) by mouth daily as needed for anxiety. 15 tablet 2  . losartan (COZAAR) 50 MG tablet Take 50 mg by mouth daily.    [name]  metFORMIN (GLUCOPHAGE) 500 MG tablet Take 1 tablet (500 mg total) by mouth 2 (two) times daily with a meal. 180 tablet 0  . nitroGLYCERIN (NITROSTAT) 0.4 MG SL tablet Place 1 tablet (0.4 mg total) under the tongue every 5 (five) minutes x 3 doses as needed for chest pain (Do not exceed 3 doses). 25 tablet 2  . ondansetron (ZOFRAN) 4 MG tablet Take 1 tablet (4 mg total) by mouth every 8  (eight) hours as needed for nausea or vomiting. 20 tablet 0  . spironolactone (ALDACTONE) 25 MG tablet TAKE ONE-HALF TABLET BY MOUTH ONCE DAILY 45 tablet 1  . traZODone (DESYREL) 100 MG tablet Take 1 tablet (100 mg total) by mouth at bedtime as needed for sleep. Pt has supply 30 tablet 1  . venlafaxine XR (EFFEXOR-XR) 150 MG 24 hr capsule Take 1 capsule (150 mg total) by mouth daily. 90 capsule 0  . Vitamin D, Ergocalciferol, (DRISDOL) 50000 units CAPS capsule Take 1 capsule (50,000 Units total) by mouth every 7 (seven) days. 8 capsule 0   No current facility-administered medications for this visit.      Musculoskeletal: Strength & Muscle Tone: within normal limits Gait & Station: normal Patient leans: N/A  Psychiatric Specialty Exam: Review of Systems  Psychiatric/Behavioral: The patient is nervous/anxious.   All other systems reviewed and are negative.   There were no vitals taken for this visit.There is no height or weight on file to calculate BMI.  General Appearance: Casual  Eye Contact:  Fair  Speech:  Clear and Coherent  Volume:  Normal  Mood:  Anxious  Affect:  Appropriate  Thought Process:  Goal Directed and Descriptions of Associations: Intact  Orientation:  Full (Time, Place, and Person)  Thought Content: Logical   Suicidal Thoughts:  No  Homicidal Thoughts:  No  Memory:  Immediate;   Fair Recent;   Fair Remote;   Fair  Judgement:  Fair  Insight:  Fair  Psychomotor Activity:  Normal  Concentration:  Concentration: Fair and Attention Span: Fair  Recall:  [employer] of Knowledge: Fair  Language: Fair  Akathisia:  No  Handed:  Right  AIMS (if indicated): na  Assets:  Communication Skills Desire for Improvement Financial Resources/Insurance Housing Intimacy Social Support Talents/Skills  ADL's:  Intact  Cognition: WNL  Sleep:  Fair   Screenings: PHQ2-9     Office Visit from 06/03/2017 in [facility] Office Visit from 01/03/2017 in  [facility]  PHQ-2 Total Score  4  0  PHQ-9 Total Score  11  No data       Assessment and Plan: Melanie Ray is a 67 year old African-American female who has a history of depression, OCD, OSA noncompliant with CPAP presented to the clinic today for a follow-up visit.  Zykira continues to report some psychosocial stressors of relational issues with her sister which is currently making her anxiety worse.  She continues to use Ativan as needed.  Discussed psychotherapy with patient but she is not interested at this time.  She denies any suicidality and has good social support from her husband.  She continues to be a good candidate for outpatient treatment at this time.  Plan For Depression Continue Effexor XR 150 mg p.o. daily  For OCD Continue Effexor XR 150 mg p.o. daily Ativan 0.5 mg as needed.  We will give her 15 pills for a month with 2 refills.  Discussed the risk of being on benzodiazepine therapy.  She reports she  has been restricting use. Psychotherapy with patient, she is not interested at this time.  For insomnia Continue trazodone 100 mg p.o. nightly as needed She also has obstructive sleep apnea, noncompliant with CPAP because she has developed a phobia towards using the mask. She however is not interested in psychotherapy.  She also declines other sleep aids or changes today with her trazodone.  Follow-up in 2-3 months or sooner if needed  More than 50 % of the time was spent for psychoeducation and supportive psychotherapy and care coordination.  This note was generated in part or whole with voice recognition software. Voice recognition is usually quite accurate but there are transcription errors that can and very often do occur. I apologize for any typographical errors that were not detected and corrected.       Melanie Ray Alert, MD 11/21/2017, 10:34 AM

## 2017-11-21 ENCOUNTER — Encounter: Payer: Self-pay | Admitting: Psychiatry

## 2017-12-04 ENCOUNTER — Ambulatory Visit (INDEPENDENT_AMBULATORY_CARE_PROVIDER_SITE_OTHER): Payer: Medicare Other | Admitting: Cardiovascular Disease

## 2017-12-04 ENCOUNTER — Encounter: Payer: Self-pay | Admitting: Cardiovascular Disease

## 2017-12-04 DIAGNOSIS — I5022 Chronic systolic (congestive) heart failure: Secondary | ICD-10-CM

## 2017-12-04 DIAGNOSIS — I10 Essential (primary) hypertension: Secondary | ICD-10-CM | POA: Diagnosis not present

## 2017-12-04 MED ORDER — FUROSEMIDE 40 MG PO TABS: 40.0000 mg | ORAL_TABLET | ORAL | 3 refills | Status: DC

## 2017-12-04 MED ORDER — LOSARTAN POTASSIUM 50 MG PO TABS: 50.0000 mg | ORAL_TABLET | Freq: Every day | ORAL | 3 refills | Status: DC

## 2017-12-04 MED ORDER — ATORVASTATIN CALCIUM 80 MG PO TABS: 80.0000 mg | ORAL_TABLET | Freq: Every day | ORAL | 3 refills | Status: DC

## 2017-12-04 NOTE — Patient Instructions (Signed)
Medication Instructions:  Your provider recommends that you continue on your current medications as directed. Please refer to the Current Medication list given to you today.    Labwork: None  Testing/Procedures: Your provider has requested that you have an echocardiogram in 6 months. Echocardiography is a painless test that uses sound waves to create images of your heart. It provides your doctor with information about the size and shape of your heart and how well your heart's chambers and valves are working. This procedure takes approximately one hour. There are no restrictions for this procedure.  Follow-Up: Your provider wants you to follow-up in: 6 months with Dr. Cooper. You will receive a reminder letter in the mail two months in advance. If you don't receive a letter, please call our office to schedule the follow-up appointment.    Any Other Special Instructions Will Be Listed Below (If Applicable).     If you need a refill on your cardiac medications before your next appointment, please call your pharmacy.   

## 2017-12-04 NOTE — Progress Notes (Signed)
Cardiology Office Note Date:  12/05/2017   ID:  Melanie Ray, DOB 1951-03-01, MRN [id number]  PCP:  Leone Haven, MD  Cardiologist:  Sherren Mocha, MD    Chief Complaint  Patient presents with  . Shortness of Breath     History of Present Illness: Melanie Ray is a 67 y.o. female who presents for follow-up evaluation. She has CAD and initially presented in 2017 with an anterior STEMI, treated with overlapping DES in the LAD. She has severe residual LV systolic dysfunction, has declined ICD. Unable to tolerate entresto.   She's here alone today. Doing very well. Today, she denies symptoms of palpitations, chest pain, shortness of breath, orthopnea, PND, lower extremity edema, dizziness, or syncope. She quit taking losartan inadvertently and needs the Rx updated. Otherwise doing fine.     Past Medical History:  Diagnosis Date  . Anemia   . Anxiety   . Back pain   . CAD (coronary artery disease) 04/11/2016   S/p ant STEMI 5/17: LHC >> LAD proximal 80%, mid 80%, distal 50%, ostial D1 60%; LCx with LPDA lesion 30%; RCA Mild calcification with no significant stenosis in a medium caliber, nondominant RCA; LVEF is estimated at 45% with inferoapical and lateral wall akinesis >> PCI: PCI: 3.5 x 24 mm Promus DES to prox LAD, 2.5 x 12 mm Promus DES to mid LAD.   [name] Chest pain   . Chronic systolic CHF (congestive heart failure) ([place]) 03/21/2016   Echo 01/30/17: Diff HK, mild focal basal septal hypertrophy, EF 30-35, mild AI, MAC, mild MR // Echo 06/08/16: Mild focal basal septal hypertrophy, EF 25-30%, diff HK, ant-septal AK, Gr 1 DD, mild AI, MAC, mild MR, PASP 37 mmHg // Echo 03/18/16: EF 30-35%, ant-septal AK, Gr 1 DD, mild MR, severe LAE.  [name] Constipation   . Depression   . Diabetes mellitus type 2 in obese ([place])   . Dizziness   . Glaucoma   . Gout   . Heart disease   . Heartburn   . History of acute anterior wall MI 03/17/2016  . History of heart attack   . Hyperlipemia   .  Hypertension   . Ischemic cardiomyopathy 10/02/2016   Refused ICD  . Joint pain   . Left bundle branch block   . Myocardial infarction ([place])   . Nausea   . Sleep apnea   . SOB (shortness of breath)   . Swelling    feet or legs    Past Surgical History:  Procedure Laterality Date  . APPENDECTOMY    . CARDIAC CATHETERIZATION N/A 03/17/2016   Procedure: Left Heart Cath and Coronary Angiography;  Surgeon: Sherren Mocha, MD;  Location: [facility];  Service: Cardiovascular;  Laterality: N/A;  . CARDIAC CATHETERIZATION N/A 03/17/2016   Procedure: Coronary Stent Intervention;  Surgeon: Sherren Mocha, MD;  Location: [facility];  Service: Cardiovascular;  Laterality: N/A;  . COLONOSCOPY WITH PROPOFOL N/A 08/13/2017   Procedure: COLONOSCOPY WITH PROPOFOL;  Surgeon: Jonathon Bellows, MD;  Location: [facility] ENDOSCOPY;  Service: Gastroenterology;  Laterality: N/A;  . SMALL BOWEL REPAIR    . TONSILLECTOMY    . UTERINE FIBROID SURGERY      Current Outpatient Medications  Medication Sig Dispense Refill  . allopurinol (ZYLOPRIM) 100 MG tablet Take 100 mg by mouth daily as needed (gout flare).     [name] aspirin EC 81 MG EC tablet Take 1 tablet (81 mg total) by mouth daily.    [name]  carvedilol (COREG) 12.5 MG tablet Take 1 1/2 tabs by mouth twice daily 90 tablet 4  . dorzolamide-timolol (COSOPT) 22.3-6.8 MG/ML ophthalmic solution Place 1 drop into both eyes 2 (two) times daily.     . furosemide (LASIX) 40 MG tablet Take 1 tablet (40 mg total) by mouth as directed. 40 mg on Mon, Wed and Fri's; all other days 20 mg 65 tablet 3  . hydrochlorothiazide (HYDRODIURIL) 25 MG tablet Take 0.5 Tabs by Mouth Once a Day.    . isosorbide mononitrate (IMDUR) 30 MG 24 hr tablet Take 1 tablet (30 mg total) by mouth daily. 30 tablet 4  . LORazepam (ATIVAN) 0.5 MG tablet Take 1 tablet (0.5 mg total) by mouth daily as needed for anxiety. 15 tablet 2  . losartan (COZAAR) 50 MG tablet Take 1 tablet (50 mg total) by mouth  daily. 90 tablet 3  . metFORMIN (GLUCOPHAGE) 500 MG tablet Take 1 tablet (500 mg total) by mouth 2 (two) times daily with a meal. 180 tablet 0  . nitroGLYCERIN (NITROSTAT) 0.4 MG SL tablet Place 1 tablet (0.4 mg total) under the tongue every 5 (five) minutes x 3 doses as needed for chest pain (Do not exceed 3 doses). 25 tablet 2  . ondansetron (ZOFRAN) 4 MG tablet Take 1 tablet (4 mg total) by mouth every 8 (eight) hours as needed for nausea or vomiting. 20 tablet 0  . spironolactone (ALDACTONE) 25 MG tablet TAKE ONE-HALF TABLET BY MOUTH ONCE DAILY 45 tablet 1  . traZODone (DESYREL) 100 MG tablet Take 1 tablet (100 mg total) by mouth at bedtime as needed for sleep. Pt has supply 30 tablet 1  . venlafaxine XR (EFFEXOR-XR) 150 MG 24 hr capsule Take 1 capsule (150 mg total) by mouth daily. 90 capsule 0  . Vitamin D, Ergocalciferol, (DRISDOL) 50000 units CAPS capsule Take 1 capsule (50,000 Units total) by mouth every 7 (seven) days. 8 capsule 0  . atorvastatin (LIPITOR) 80 MG tablet Take 1 tablet (80 mg total) by mouth daily. 90 tablet 3   No current facility-administered medications for this visit.     Allergies:   Tetracycline and Meperidine   Social History:  The patient  reports that she quit smoking about 20 years ago. Her smoking use included cigarettes. she has never used smokeless tobacco. She reports that she drinks about 0.6 oz of alcohol per week. She reports that she does not use drugs.   Family History:  The patient's  family history includes Anxiety disorder in her maternal aunt and mother; Dementia in her mother; Depression in her mother; Diabetes in her mother; Drug abuse in her cousin; Heart disease in her mother; Heart failure in her mother; High Cholesterol in her father and mother; Hyperlipidemia in her mother; Hypertension in her father and mother; Mood Disorder in her sister; Paranoid behavior in her mother; Stroke in her mother and sister; Tuberculosis in her paternal grandfather.     ROS:  Please see the history of present illness.  Otherwise, review of systems is positive for dizziness, leg pain, excessive sweating, snoring, anxiety.  All other systems are reviewed and negative.    PHYSICAL EXAM: VS:  BP 138/78   Pulse 86   Ht _0  (1.6 m)   Wt 188 lb 12.8 oz (85.6 kg)   BMI 33.44 kg/m  , BMI Body mass index is 33.44 kg/m. GEN: Well nourished, well developed, in no acute distress  HEENT: normal  Neck: no JVD, no masses. No  carotid bruits Cardiac: RRR without murmur or gallop                Respiratory:  clear to auscultation bilaterally, normal work of breathing GI: soft, nontender, nondistended, + BS MS: no deformity or atrophy  Ext: no pretibial edema, pedal pulses 2+= bilaterally Skin: warm and dry, no rash Neuro:  Strength and sensation are intact Psych: euthymic mood, full affect  EKG:  EKG is ordered today. The ekg ordered today shows NSR 86 bpm, occasional PVC, LBBB  Recent Labs: 06/03/2017: ALT 13; BUN 20; Creatinine, Ser 0.80; Potassium 4.7; Sodium 140; TSH 0.934 10/23/2017: Hemoglobin 12.0; Platelets 348   Lipid Panel     Component Value Date/Time   CHOL 124 03/21/2017 1022   TRIG 114 03/21/2017 1022   HDL 36 (L) 03/21/2017 1022   CHOLHDL 3.4 03/21/2017 1022   CHOLHDL 4 01/03/2017 0947   VLDL 22.6 01/03/2017 0947   LDLCALC 65 03/21/2017 1022      Wt Readings from Last 3 Encounters:  12/04/17 188 lb 12.8 oz (85.6 kg)  10/23/17 170 lb (77.1 kg)  09/16/17 173 lb (78.5 kg)     Cardiac Studies Reviewed: 2D Echo 01-30-2017: Study Conclusions  - Left ventricle: Diffuse hypokinesis with abnormal septal motion   and marked dysnergy between septum and lateral wall. The cavity   size was moderately dilated. There was mild focal basal   hypertrophy of the septum. Systolic function was moderately to   severely reduced. The estimated ejection fraction was in the   range of 30% to 35%. Doppler parameters are consistent with both    elevated ventricular end-diastolic filling pressure and elevated   left atrial filling pressure. - Aortic valve: There was mild regurgitation. - Mitral valve: Calcified annulus. Mildly thickened leaflets .   There was mild regurgitation. - Atrial septum: No defect or patent foramen ovale was identified.  ASSESSMENT AND PLAN: 1.  CAD, native vessel, without angina: stable on her current Rx. Continue same program without change.   2. Chronic systolic CHF: medications include carvedilol, furosemide, losartan (refill written), and aldactone. Couldn't tolerate entresto. Check echo prior to next visit. Has declined ICD or further EP evaluation.  3. Essential HTN: BP well-controlled.   4. Hypercholesterolemia: continue atorvastatin 80 mg daily.  Current medicines are reviewed with the patient today.  The patient does not have concerns regarding medicines.  Labs/ tests ordered today include:   Orders Placed This Encounter  Procedures  . EKG 12-Lead  . ECHOCARDIOGRAM COMPLETE    Disposition:   FU 6 months  Signed, Sherren Mocha, MD  12/05/2017 11:25 PM    [address], [place], [place]  [address] Phone: [phone]; Fax: [phone]

## 2017-12-05 ENCOUNTER — Telehealth: Payer: Self-pay | Admitting: Cardiovascular Disease

## 2017-12-05 NOTE — Telephone Encounter (Signed)
Instructed patient to have PCP to refer her to a GYN for other possible recommendations if she does not agree with PCP's suggestions. She was grateful for call and agrees with treatment plan.

## 2017-12-05 NOTE — Telephone Encounter (Signed)
Melanie Ray is calling to find out if Dr. Burt Knack can give her some thing for hot flashes or any recommendations and her PCP states he can only give her Hormone medication which she does not want to go on and she does not see a GYN Doctor  Please call

## 2017-12-12 ENCOUNTER — Ambulatory Visit: Payer: Medicare Other | Admitting: Family Medicine

## 2017-12-25 DIAGNOSIS — H40003 Preglaucoma, unspecified, bilateral: Secondary | ICD-10-CM | POA: Diagnosis not present

## 2017-12-27 DIAGNOSIS — H35371 Puckering of macula, right eye: Secondary | ICD-10-CM | POA: Diagnosis not present

## 2017-12-27 DIAGNOSIS — H2513 Age-related nuclear cataract, bilateral: Secondary | ICD-10-CM | POA: Diagnosis not present

## 2017-12-30 ENCOUNTER — Other Ambulatory Visit: Payer: Self-pay | Admitting: Cardiovascular Disease

## 2017-12-30 MED ORDER — ATORVASTATIN CALCIUM 80 MG PO TABS: 80.0000 mg | ORAL_TABLET | Freq: Every day | ORAL | 3 refills | Status: DC

## 2017-12-30 NOTE — Telephone Encounter (Signed)
°*  STAT* If patient is at the pharmacy, call can be transferred to refill team.   1. Which medications need to be refilled? (please list name of each medication and dose if known) need a new prescription for her Atorvastatin  2. Which pharmacy/location (including street and city if local pharmacy) is medication to be sent to [facility]  3. Do they need a 30 day or 90 day supply?90 and refills

## 2018-01-06 ENCOUNTER — Telehealth: Payer: Self-pay | Admitting: Cardiovascular Disease

## 2018-01-06 MED ORDER — OLMESARTAN MEDOXOMIL 20 MG PO TABS: 20.0000 mg | ORAL_TABLET | Freq: Every day | ORAL | 3 refills | Status: DC

## 2018-01-06 NOTE — Telephone Encounter (Signed)
I spoke with pt and gave her information from pharmacist.  She does not have BP cuff at home but will check BP periodically at [facility]. She is concerned about taking anymore losartan so will stop now and begin olmesartan.  Will send prescription to [facility] on [address] in [place].

## 2018-01-06 NOTE — Telephone Encounter (Signed)
Given recent recall with multiple ARBs including losartan, irbesartan, and valsartan, would recommend switching to equivalent dose of olmesartan 20mg  daily and advise pt to monitor her BP readings over the next few weeks to ensure readings remain stable.

## 2018-01-06 NOTE — Telephone Encounter (Signed)
Pt c/o medication issue:  1. Name of Medication: Losartan   2. How are you currently taking this medication (dosage and times per day)? 50 mg// 1x a day   3. Are you having a reaction (difficulty breathing--STAT)? No   4. What is your medication issue? Medication being recalled

## 2018-01-07 ENCOUNTER — Other Ambulatory Visit: Payer: Self-pay

## 2018-01-09 ENCOUNTER — Telehealth: Payer: Self-pay | Admitting: Cardiovascular Disease

## 2018-01-09 MED ORDER — TELMISARTAN 40 MG PO TABS: 40.0000 mg | ORAL_TABLET | Freq: Every day | ORAL | 11 refills | Status: DC

## 2018-01-09 NOTE — Telephone Encounter (Signed)
New Message    Pt c/o medication issue:  1. Name of Medication: olmesartan 2. How are you currently taking this medication (dosage and times per day)? 20mg  daily  3. Are you having a reaction (difficulty breathing--STAT)? none  4. What is your medication issue? Patient is calling in reference to her BP medication. She states that the medication olmesartan is not available at the pharmacy so they want to see if the doctor will approve something else. Please call to discuss.

## 2018-01-09 NOTE — Telephone Encounter (Signed)
Ok to switch to telmisartan 40mg  once daily which is available at pharmacy.

## 2018-01-09 NOTE — Telephone Encounter (Signed)
Since Benicar is unavailable and patient was never able to pick it up, instructed her to START TELMISARTAN 40 mg daily. She was grateful for call and agrees with treatment plan.

## 2018-01-09 NOTE — Telephone Encounter (Signed)
Ok to change to telmisartan, addressed in separate phone note and rx already sent in.

## 2018-01-10 ENCOUNTER — Telehealth: Payer: Self-pay | Admitting: Cardiovascular Disease

## 2018-01-10 MED ORDER — OLMESARTAN MEDOXOMIL 20 MG PO TABS: 20.0000 mg | ORAL_TABLET | Freq: Every day | ORAL | 11 refills | Status: DC

## 2018-01-10 NOTE — Telephone Encounter (Signed)
Pt c/o medication issue:  1. Name of Medication: Telmisartan   2. How are you currently taking this medication (dosage and times per day)? 40 mg //1 x daily   3. Are you having a reaction (difficulty breathing--STAT)? Yes, sob, Patient hands, feet and face were swollen   4. What is your medication issue? Patient believes that she is allergic to medication

## 2018-01-10 NOTE — Telephone Encounter (Signed)
Pt c/o medication issue:  1. Name of Medication: Telmisartan   2. How are you currently taking this medication (dosage and times per day)? 40 mg //1 x daily   3. Are you having a reaction (difficulty breathing--STAT)? Happened last night she had sob, Patient hands, feet and face were swollen   4. What is your medication issue? Patient believes that she is allergic to medication

## 2018-01-10 NOTE — Telephone Encounter (Signed)
Patient states her swelling and SOB have completely resolved. She would like to try olmesartan 20 mg (this was prescribed 3/4 but she was never able to try this as the pharmacy was out of the medication).  Instructed the patient to take a couple days off and start the medication at the beginning of the week. She was grateful for call and agrees with treatment plan.  Olmesartan called into different pharmacy per patient request.

## 2018-01-10 NOTE — Telephone Encounter (Signed)
Patient reports she took one dose of telmisartan yesterday and thinks she is allergic to the medication.  She took the medication between 1800-1900 last night and was initially fine.  She woke up several times in the middle of the night to use the bathroom (this is not abnormal) and noticed she was a little short of breath when she "fiddled around." She also noticed when she looked in the mirror over night her face was a little swollen. Then she looked at her hands and feet and noticed those were a little swollen too. She states she never itched and never exhibited any rash or skin discoloration. She woke up at 0600 this AM and has been monitoring her symptoms. She states the swelling and SOB has almost completely subsided now. She has no other symptoms and "almost feels normal now." She is taking her other medications as directed. She states these symptoms were "different from heart symptoms." She will take a dose of Benadryl to see if that will help kick her symptoms. She understands to stay around the house/not to drive after taking. She will discontinue Telmisartan and understands to proceed to Urgent Care if symptoms worsen. She understands she will be called with further medication recommendations.

## 2018-01-10 NOTE — Telephone Encounter (Signed)
No other ARBs left that have not been affected by recall or shortage. Unsure why pt would react to telmisartan but not previous ARB therapy. Could see if candesartan is available however insurances typically do not cover this ARB. Equivalent dose would be candesartan 16mg  daily.  Pt did not tolerate Entresto previously and has also been on lisinopril in the past. Do not see allergy listed but would clarify to see if pt had a cough with this. If not, could switch to any other ACEi therapy.

## 2018-01-10 NOTE — Telephone Encounter (Signed)
Follow up   Patient requesting to speak with nurse regarding recent medication change.

## 2018-01-29 ENCOUNTER — Telehealth: Payer: Self-pay

## 2018-01-29 NOTE — Telephone Encounter (Signed)
-----   Message from Theodoro Parma, RN sent at 12/04/2017  4:50 PM EST ----- Regarding: echo/ov same day early August 2019 6 mo OV/echo from 12/04/17

## 2018-01-29 NOTE — Telephone Encounter (Signed)
Scheduled patient for 6 month echo and OV with Dr. Burt Knack 8/26. She was grateful for call and agrees with treatment plan.

## 2018-02-07 ENCOUNTER — Ambulatory Visit (INDEPENDENT_AMBULATORY_CARE_PROVIDER_SITE_OTHER): Payer: Medicare Other | Admitting: Family Medicine

## 2018-02-07 ENCOUNTER — Other Ambulatory Visit: Payer: Self-pay

## 2018-02-07 ENCOUNTER — Encounter: Payer: Self-pay | Admitting: Family Medicine

## 2018-02-07 VITALS — BP 126/72 | HR 79 | Temp 98.8°F | Ht 63.0 in | Wt 181.4 lb

## 2018-02-07 DIAGNOSIS — R21 Rash and other nonspecific skin eruption: Secondary | ICD-10-CM | POA: Insufficient documentation

## 2018-02-07 DIAGNOSIS — I10 Essential (primary) hypertension: Secondary | ICD-10-CM

## 2018-02-07 DIAGNOSIS — E7800 Pure hypercholesterolemia, unspecified: Secondary | ICD-10-CM

## 2018-02-07 DIAGNOSIS — E119 Type 2 diabetes mellitus without complications: Secondary | ICD-10-CM | POA: Diagnosis not present

## 2018-02-07 DIAGNOSIS — N951 Menopausal and female climacteric states: Secondary | ICD-10-CM | POA: Insufficient documentation

## 2018-02-07 DIAGNOSIS — I5020 Unspecified systolic (congestive) heart failure: Secondary | ICD-10-CM | POA: Diagnosis not present

## 2018-02-07 DIAGNOSIS — L989 Disorder of the skin and subcutaneous tissue, unspecified: Secondary | ICD-10-CM

## 2018-02-07 DIAGNOSIS — E559 Vitamin D deficiency, unspecified: Secondary | ICD-10-CM | POA: Diagnosis not present

## 2018-02-07 DIAGNOSIS — I5022 Chronic systolic (congestive) heart failure: Secondary | ICD-10-CM

## 2018-02-07 HISTORY — DX: Rash and other nonspecific skin eruption: R21

## 2018-02-07 HISTORY — DX: Menopausal and female climacteric states: N95.1

## 2018-02-07 LAB — COMPREHENSIVE METABOLIC PANEL
ALBUMIN: 4.3 g/dL (ref 3.5–5.2)
ALT: 20 U/L (ref 0–35)
AST: 19 U/L (ref 0–37)
Alkaline Phosphatase: 150 U/L — ABNORMAL HIGH (ref 39–117)
BUN: 16 mg/dL (ref 6–23)
CALCIUM: 10.6 mg/dL — AB (ref 8.4–10.5)
CHLORIDE: 104 meq/L (ref 96–112)
CO2: 27 mEq/L (ref 19–32)
Creatinine, Ser: 1.04 mg/dL (ref 0.40–1.20)
GFR: 67.82 mL/min (ref >60.00)
Glucose, Bld: 113 mg/dL — ABNORMAL HIGH (ref 70–99)
POTASSIUM: 4 meq/L (ref 3.5–5.1)
Sodium: 138 mEq/L (ref 135–145)
Total Bilirubin: 0.4 mg/dL (ref 0.2–1.2)
Total Protein: 7.7 g/dL (ref 6.0–8.3)

## 2018-02-07 LAB — LDL CHOLESTEROL, DIRECT: LDL DIRECT: 78 mg/dL

## 2018-02-07 LAB — CBC
HCT: 35.9 % — ABNORMAL LOW (ref 36.0–46.0)
HEMOGLOBIN: 12 g/dL (ref 12.0–15.0)
MCHC: 33.5 g/dL (ref 30.0–36.0)
MCV: 81.1 fl (ref 78.0–100.0)
PLATELETS: 359 10*3/uL (ref 150.0–400.0)
RBC: 4.42 Mil/uL (ref 3.87–5.11)
RDW: 14.4 % (ref 11.5–15.5)
WBC: 6.1 10*3/uL (ref 4.0–10.5)

## 2018-02-07 LAB — TSH: TSH: 1.41 u[IU]/mL (ref 0.35–4.50)

## 2018-02-07 LAB — VITAMIN D 25 HYDROXY (VIT D DEFICIENCY, FRACTURES): VITD: 47.19 ng/mL (ref 30.00–100.00)

## 2018-02-07 LAB — HEMOGLOBIN A1C: Hgb A1c MFr Bld: 7.4 % — ABNORMAL HIGH (ref 4.6–6.5)

## 2018-02-07 MED ORDER — ISOSORBIDE MONONITRATE ER 30 MG PO TB24: 30.0000 mg | ORAL_TABLET | Freq: Every day | ORAL | 1 refills | Status: DC

## 2018-02-07 MED ORDER — SPIRONOLACTONE 25 MG PO TABS: 12.5000 mg | ORAL_TABLET | Freq: Every day | ORAL | 1 refills | Status: DC

## 2018-02-07 MED ORDER — METFORMIN HCL 500 MG PO TABS: 500.0000 mg | ORAL_TABLET | Freq: Two times a day (BID) | ORAL | 0 refills | Status: DC

## 2018-02-07 NOTE — Assessment & Plan Note (Signed)
Needs to be rechecked.

## 2018-02-07 NOTE — Assessment & Plan Note (Signed)
Suspect related to menopause.  We will check a TSH.  I encouraged her not to take supplements for this.  Will refer to gynecology to determine further treatment.

## 2018-02-07 NOTE — Assessment & Plan Note (Signed)
Check A1c.  Continue metformin.  Foot exam completed.

## 2018-02-07 NOTE — Assessment & Plan Note (Signed)
-

## 2018-02-07 NOTE — Assessment & Plan Note (Signed)
Stable.  Reports minimal edema that may be related to her having been out of her Lasix.  She will resume this.  Monitor for symptoms.  She will continue to follow with cardiology.

## 2018-02-07 NOTE — Progress Notes (Signed)
Tommi Rumps, MD Phone: [phone]  Melanie Ray is a 67 y.o. female who presents today for f/u.  DIABETES Disease Monitoring: Blood Sugar ranges-not checking Polyuria/phagia/dipsia- no      Optho- UTD sees 2x/yr Medications: Compliance- taking metformin Hypoglycemic symptoms- no  HYPERLIPIDEMIA Medications: Compliance- taking lipitor  Right upper quadrant pain- no  Muscle aches- no  CHF: Follows with cardiology. Taking carvedilol, Lasix though she thinks she ran out of this about a week ago and has not gotten a refill, Imdur, olmesartan, and spironolactone.  Rare edema in the tops of her feet over the last week.  No shortness of breath, orthopnea, or PND.  She reports some right medial anterior ankle pain for a few weeks.  Comes and goes.  Mostly when she walks.  Has not improved or worsened.  She has not done anything for this.  She reports hot flashes occurring every 15-20 minutes.  She has been having them since menopause though they are worse over the last year.  She is trying an over-the-counter supplement that appears to be black cohosh.  She notes a skin lesion on her right lateral malleolus that is been present for some time and is unchanged.  It is consistent with a seborrheic keratosis.  She notes a skin lesion further up in the midportion of her right lower lateral leg that has been present for 5 or 6 months and has not healed.  It was using previously.  Social History   Tobacco Use  Smoking Status Former Smoker  . Types: Cigarettes  . Last attempt to quit: 04/13/1997  . Years since quitting: 20.8  Smokeless Tobacco Never Used     ROS see history of present illness  Objective  Physical Exam Vitals:   02/07/18 0856  BP: 126/72  Pulse: 79  Temp: 98.8 F (37.1 C)  SpO2: 96%    BP Readings from Last 3 Encounters:  02/07/18 126/72  12/04/17 138/78  10/23/17 (!) 122/57   Wt Readings from Last 3 Encounters:  02/07/18 181 lb 6.4 oz (82.3 kg)    12/04/17 188 lb 12.8 oz (85.6 kg)  10/23/17 170 lb (77.1 kg)    Physical Exam  Constitutional: No distress.  Cardiovascular: Normal rate, regular rhythm and normal heart sounds.  Pulmonary/Chest: Effort normal and breath sounds normal.  Musculoskeletal: She exhibits no edema.       Legs: Neurological: She is alert. Gait normal.  Skin: Skin is warm and dry. She is not diaphoretic.   Diabetic Foot Exam - Simple   Simple Foot Form Diabetic Foot exam was performed with the following findings:  Yes 02/07/2018  9:28 AM  Visual Inspection No deformities, no ulcerations, no other skin breakdown bilaterally:  Yes Sensation Testing Intact to touch and monofilament testing bilaterally:  Yes Pulse Check Posterior Tibialis and Dorsalis pulse intact bilaterally:  Yes Comments     Assessment/Plan: Please see individual problem list.  Essential hypertension Well-controlled.  Continue current regimen.  Chronic systolic CHF (congestive heart failure) (HCC) Stable.  Reports minimal edema that may be related to her having been out of her Lasix.  She will resume this.  Monitor for symptoms.  She will continue to follow with cardiology.  DM type 2 (diabetes mellitus, type 2) (HCC) Check A1c.  Continue metformin.  Foot exam completed.  Skin lesion One nonhealing skin lesion and one seborrheic keratosis noted on the right lower extremity.  She will be referred to dermatology.  Vitamin D deficiency Needs to be rechecked.  Hot flash, menopausal Suspect related to menopause.  We will check a TSH.  I encouraged her not to take supplements for this.  Will refer to gynecology to determine further treatment.  HLD (hyperlipidemia) Continue Lipitor.  Check lipid panel.   Health Maintenance: She is unsure about doing a mammogram.  She will let us know if she would like to proceed with this.  We will send a message to her GI physician regarding her colonoscopy which does not seem to have been adequate  and was supposed to be repeated though she reports Medicare would not pay for this.  Orders Placed This Encounter  Procedures  . HgB A1c  . Comp Met (CMET)  . TSH  . CBC  . Vitamin D (25 hydroxy)  . Direct LDL  . Ambulatory referral to Dermatology    Referral Priority:   Routine    Referral Type:   Consultation    Referral Reason:   Specialty Services Required    Requested Specialty:   Dermatology    Number of Visits Requested:   1  . Ambulatory referral to Gynecology    Referral Priority:   Routine    Referral Type:   Consultation    Referral Reason:   Specialty Services Required    Requested Specialty:   Gynecology    Number of Visits Requested:   1    Meds ordered this encounter  Medications  . spironolactone (ALDACTONE) 25 MG tablet    Sig: Take 0.5 tablets (12.5 mg total) by mouth daily.    Dispense:  45 tablet    Refill:  1  . metFORMIN (GLUCOPHAGE) 500 MG tablet    Sig: Take 1 tablet (500 mg total) by mouth 2 (two) times daily with a meal.    Dispense:  180 tablet    Refill:  0  . isosorbide mononitrate (IMDUR) 30 MG 24 hr tablet    Sig: Take 1 tablet (30 mg total) by mouth daily.    Dispense:  90 tablet    Refill:  1     Tommi Rumps, MD [place]

## 2018-02-07 NOTE — Patient Instructions (Signed)
Nice to see you. Please get a refill of your Lasix. We will get you to see dermatology and gynecology. Please ice and rest your right ankle and elevate it.  If it is not improving in the next 1-2 weeks please contact us and we can perform an x-ray. We will check lab work today and contact you with the results. If you decide you want to do mammogram please contact us.

## 2018-02-07 NOTE — Assessment & Plan Note (Signed)
Well-controlled.  Continue current regimen. 

## 2018-02-07 NOTE — Assessment & Plan Note (Signed)
One nonhealing skin lesion and one seborrheic keratosis noted on the right lower extremity.  She will be referred to dermatology.

## 2018-02-10 ENCOUNTER — Telehealth: Payer: Self-pay | Admitting: Family Medicine

## 2018-02-10 ENCOUNTER — Other Ambulatory Visit: Payer: Self-pay | Admitting: Family Medicine

## 2018-02-10 DIAGNOSIS — R748 Abnormal levels of other serum enzymes: Secondary | ICD-10-CM

## 2018-02-10 NOTE — Telephone Encounter (Signed)
-----   Message from Jonathon Bellows, MD sent at 02/10/2018 11:23 AM EDT ----- Regarding: Jeral Fruit can you look into it please  Dr Caryl Bis  I will forward this message to my practice manager and nurse. There are some patient assistance programs which I will ask Jody to look into if we can get him the procedure and if yes will have Panya schedule it  Regards  Kiran  ----- Message ----- From: Leone Haven, MD Sent: 02/07/2018   6:07 PM To: Jonathon Bellows, MD  Delene Ruffini,   I saw this patient today for follow-up and noted she was supposed to have a follow-up colonoscopy due to poor prep on her initial colonoscopy last year, though she never went through with this. She noted that medicare would not pay for the repeat and appears to have cancelled her repeat colonoscopy. I wanted to see if there was anything your office could do to check in to this issue for the patient as she needs to have the repeat done if financially feasible. Thanks for your help.   Randall Hiss

## 2018-02-10 NOTE — Telephone Encounter (Signed)
Please let the patient know I heard back from GI and they are going to look in to options for getting her colonoscopy done. If they are able to find a way to get this completed they should contact her. If she does not hear anything from them in the next couple of weeks she should contact Dr Georgeann Oppenheim office to follow-up on this. Thanks.

## 2018-02-11 NOTE — Telephone Encounter (Signed)
Left message to return call, ok for pec to speak about message below and also the message about GI  She no longer needs the prescription dose of vitamin d supplementation. She can take vitamin D 1000 IU over the counter for supplementation. I'm not sure that those symptoms are related to the vitamin D as her vitamin D level is normal. If those symptoms persist she needs to let us know. Labs ordered.

## 2018-02-12 DIAGNOSIS — H524 Presbyopia: Secondary | ICD-10-CM | POA: Diagnosis not present

## 2018-02-12 DIAGNOSIS — H401134 Primary open-angle glaucoma, bilateral, indeterminate stage: Secondary | ICD-10-CM | POA: Diagnosis not present

## 2018-02-12 DIAGNOSIS — H25813 Combined forms of age-related cataract, bilateral: Secondary | ICD-10-CM | POA: Diagnosis not present

## 2018-02-12 DIAGNOSIS — H35371 Puckering of macula, right eye: Secondary | ICD-10-CM | POA: Diagnosis not present

## 2018-02-12 LAB — HM DIABETES EYE EXAM

## 2018-02-12 NOTE — Telephone Encounter (Signed)
Patient notified

## 2018-02-13 ENCOUNTER — Encounter: Payer: Self-pay | Admitting: Family Medicine

## 2018-02-18 ENCOUNTER — Ambulatory Visit: Payer: Medicare Other | Admitting: Family Medicine

## 2018-02-19 ENCOUNTER — Ambulatory Visit (INDEPENDENT_AMBULATORY_CARE_PROVIDER_SITE_OTHER): Payer: Medicare Other | Admitting: Psychiatry

## 2018-02-19 ENCOUNTER — Ambulatory Visit: Payer: Medicare Other | Admitting: Psychiatry

## 2018-02-19 ENCOUNTER — Encounter: Payer: Self-pay | Admitting: Psychiatry

## 2018-02-19 ENCOUNTER — Other Ambulatory Visit: Payer: Self-pay

## 2018-02-19 VITALS — BP 136/72 | HR 82 | Temp 97.8°F | Wt 179.6 lb

## 2018-02-19 DIAGNOSIS — G4733 Obstructive sleep apnea (adult) (pediatric): Secondary | ICD-10-CM

## 2018-02-19 DIAGNOSIS — F429 Obsessive-compulsive disorder, unspecified: Secondary | ICD-10-CM | POA: Diagnosis not present

## 2018-02-19 DIAGNOSIS — F331 Major depressive disorder, recurrent, moderate: Secondary | ICD-10-CM

## 2018-02-19 MED ORDER — VENLAFAXINE HCL ER 150 MG PO CP24: 150.0000 mg | ORAL_CAPSULE | Freq: Every day | ORAL | 1 refills | Status: DC

## 2018-02-19 NOTE — Progress Notes (Signed)
[facility] MD OP Progress Note  02/19/2018 9:32 PM Melanie Ray  MRN:  [id number]  Chief Complaint: ' I am here for follow up.' Chief Complaint    Follow-up; Medication Refill     HPI: Melanie Ray is a 66 year old African-American female who presented to the clinic today for a follow-up visit.  Melanie Ray has a history of major depressive disorder as well as OCD.  She used to follow up with Dr. Gretel Acre in the past.  This is her second visit with Probation officer.   Melanie Ray today reports her mood symptoms as currently stable on the current medication regimen.  She continues to be compliant with Effexor as prescribed.  She denies any side effects to the medications at this time.  Melanie Ray does report some continued OCD symptoms.  She reports there are things in her house that she does not want to touch.  She however reports her OCD symptoms have improved a lot and she is not distressed by her symptoms at this point.  She continues to make use of Ativan as needed.  She reports she uses it very rarely though.  Patient is aware of the risk of being on medications like Ativan including cognitive problems.  She has been restricting use.  Patient does report some memory issues especially problems with short-term memory.  Patient hence completed an MMSE today in our clinic.  I have also reviewed her most recent labs like TSH, vitamin B12, vitamin D-which all seems to be within normal limits.  Patient reports sleep is good.  She has trazodone available as needed for sleep.  Her husband continues to be supportive. Visit Diagnosis:    ICD-10-CM   1. Obsessive-compulsive disorder with good or fair insight F42.9   2. Major depressive disorder, recurrent episode, moderate (HCC) F33.1 venlafaxine XR (EFFEXOR-XR) 150 MG 24 hr capsule  3. 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  She reports she had several suicide attempts by  overdose in the past.  She reports she was admitted to the inpatient unit in 1994 as well as 2012 in [place].  She used to see a psychiatrist in [place] previously.  She relocated to [facility] and was seen by Dr. Gretel Acre in the past.  Past Medical History:  Past Medical History:  Diagnosis Date  . Anemia   . Anxiety   . Back pain   . CAD (coronary artery disease) 04/11/2016   S/p ant STEMI 5/17: LHC >> LAD proximal 80%, mid 80%, distal 50%, ostial D1 60%; LCx with LPDA lesion 30%; RCA Mild calcification with no significant stenosis in a medium caliber, nondominant RCA; LVEF is estimated at 45% with inferoapical and lateral wall akinesis >> PCI: PCI: 3.5 x 24 mm Promus DES to prox LAD, 2.5 x 12 mm Promus DES to mid LAD.   [name] Chest pain   . Chronic systolic CHF (congestive heart failure) ([place]) 03/21/2016   Echo 01/30/17: Diff HK, mild focal basal septal hypertrophy, EF 30-35, mild AI, MAC, mild MR // Echo 06/08/16: Mild focal basal septal hypertrophy, EF 25-30%, diff HK, ant-septal AK, Gr 1 DD, mild AI, MAC, mild MR, PASP 37 mmHg // Echo 03/18/16: EF 30-35%, ant-septal AK, Gr 1 DD, mild MR, severe LAE.  [name] Constipation   . Depression   . Diabetes mellitus type 2 in obese ([place])   . Dizziness   . Glaucoma   .  Gout   . Heart disease   . Heartburn   . History of acute anterior wall MI 03/17/2016  . History of heart attack   . Hyperlipemia   . Hypertension   . Ischemic cardiomyopathy 10/02/2016   Refused ICD  . Joint pain   . Left bundle branch block   . Myocardial infarction ([place])   . Nausea   . Sleep apnea   . SOB (shortness of breath)   . Swelling    feet or legs    Past Surgical History:  Procedure Laterality Date  . APPENDECTOMY    . CARDIAC CATHETERIZATION N/A 03/17/2016   Procedure: Left Heart Cath and Coronary Angiography;  Surgeon: Sherren Mocha, MD;  Location: [facility];  Service: Cardiovascular;  Laterality: N/A;  . CARDIAC CATHETERIZATION N/A 03/17/2016    Procedure: Coronary Stent Intervention;  Surgeon: Sherren Mocha, MD;  Location: [facility];  Service: Cardiovascular;  Laterality: N/A;  . COLONOSCOPY WITH PROPOFOL N/A 08/13/2017   Procedure: COLONOSCOPY WITH PROPOFOL;  Surgeon: Jonathon Bellows, MD;  Location: [facility];  Service: Gastroenterology;  Laterality: N/A;  . SMALL BOWEL REPAIR    . TONSILLECTOMY    . UTERINE FIBROID SURGERY      Family Psychiatric History: Mother - mental illness  Family History:  Family History  Problem Relation Age of Onset  . Anxiety disorder Mother   . Paranoid behavior Mother   . Hypertension Mother   . Heart failure Mother   . Stroke Mother   . Dementia Mother   . High Cholesterol Mother   . Diabetes Mother   . Hyperlipidemia Mother   . Heart disease Mother   . Depression Mother   . Hypertension Father   . High Cholesterol Father   . Mood Disorder Sister   . Stroke Sister   . Anxiety disorder Maternal Aunt   . Drug abuse Cousin   . Tuberculosis Paternal Grandfather     Social History: She is married . Lives in [place]. She denies having children. She is a retired Education officer, museum. Social History   Socioeconomic History  . Marital status: Married    Spouse name: Not on file  . Number of children: 0  . Years of education: BS in education  . Highest education level: Not on file  Occupational History  . Occupation:  Retired Education officer, museum  Social Needs  . Financial resource strain: Not on file  . Food insecurity:    Worry: Not on file    Inability: Not on file  . Transportation needs:    Medical: Not on file    Non-medical: Not on file  Tobacco Use  . Smoking status: Former Smoker    Types: Cigarettes    Last attempt to quit: 04/13/1997    Years since quitting: 20.8  . Smokeless tobacco: Never Used  Substance and Sexual Activity  . Alcohol use: Yes    Alcohol/week: 0.6 oz    Types: 1 Glasses of wine per week    Comment: 2-3 glasses of wine per month  . Drug use: No   . Sexual activity: Never  Lifestyle  . Physical activity:    Days per week: Not on file    Minutes per session: Not on file  . Stress: Not on file  Relationships  . Social connections:    Talks on phone: Not on file    Gets together: Not on file    Attends religious service: Not on file  Active member of club or organization: Not on file    Attends meetings of clubs or organizations: Not on file    Relationship status: Not on file  Other Topics Concern  . Not on file  Social History Narrative   Lives in [place] with spouse.  No children.   Retired first [employer] for over 30 years ([employer] for 10 years and then in [employer] for over 20 years).   Left-handed    Allergies:  Allergies  Allergen Reactions  . Tetracycline Swelling  . Meperidine Nausea And Vomiting    Nausea    Metabolic Disorder Labs: Lab Results  Component Value Date   HGBA1C 7.4 (H) 02/07/2018   MPG 131 03/18/2016   No results found for: PROLACTIN Lab Results  Component Value Date   CHOL 124 03/21/2017   TRIG 114 03/21/2017   HDL 36 (L) 03/21/2017   CHOLHDL 3.4 03/21/2017   VLDL 22.6 01/03/2017   LDLCALC 65 03/21/2017   LDLCALC 72 01/03/2017   Lab Results  Component Value Date   TSH 1.41 02/07/2018   TSH 0.934 06/03/2017    Therapeutic Level Labs: No results found for: LITHIUM No results found for: VALPROATE No components found for:  CBMZ  Current Medications: Current Outpatient Medications  Medication Sig Dispense Refill  . allopurinol (ZYLOPRIM) 100 MG tablet Take 100 mg by mouth daily as needed (gout flare).     [name] aspirin EC 81 MG EC tablet Take 1 tablet (81 mg total) by mouth daily.    [name] atorvastatin (LIPITOR) 80 MG tablet Take 1 tablet (80 mg total) by mouth daily. 90 tablet 3  . carvedilol (COREG) 12.5 MG tablet Take 1 1/2 tabs by mouth twice daily 90 tablet 4  . dorzolamide-timolol (COSOPT) 22.3-6.8 MG/ML ophthalmic solution Place 1 drop into both eyes 2 (two) times  daily.     . furosemide (LASIX) 40 MG tablet Take 1 tablet (40 mg total) by mouth as directed. 40 mg on Mon, Wed and Fri's; all other days 20 mg 65 tablet 3  . isosorbide mononitrate (IMDUR) 30 MG 24 hr tablet Take 1 tablet (30 mg total) by mouth daily. 90 tablet 1  . LORazepam (ATIVAN) 0.5 MG tablet Take 1 tablet (0.5 mg total) by mouth daily as needed for anxiety. 15 tablet 2  . metFORMIN (GLUCOPHAGE) 500 MG tablet Take 1 tablet (500 mg total) by mouth 2 (two) times daily with a meal. 180 tablet 0  . nitroGLYCERIN (NITROSTAT) 0.4 MG SL tablet Place 1 tablet (0.4 mg total) under the tongue every 5 (five) minutes x 3 doses as needed for chest pain (Do not exceed 3 doses). 25 tablet 2  . olmesartan (BENICAR) 20 MG tablet Take 1 tablet (20 mg total) by mouth daily. 30 tablet 11  . ondansetron (ZOFRAN) 4 MG tablet Take 1 tablet (4 mg total) by mouth every 8 (eight) hours as needed for nausea or vomiting. 20 tablet 0  . spironolactone (ALDACTONE) 25 MG tablet Take 0.5 tablets (12.5 mg total) by mouth daily. 45 tablet 1  . traZODone (DESYREL) 100 MG tablet Take 1 tablet (100 mg total) by mouth at bedtime as needed for sleep. Pt has supply 30 tablet 1  . venlafaxine XR (EFFEXOR-XR) 150 MG 24 hr capsule Take 1 capsule (150 mg total) by mouth daily. 90 capsule 1  . Vitamin D, Ergocalciferol, (DRISDOL) 50000 units CAPS capsule Take 1 capsule (50,000 Units total) by mouth every 7 (seven) days. 8 capsule  0   No current facility-administered medications for this visit.      Musculoskeletal: Strength & Muscle Tone: within normal limits Gait & Station: normal Patient leans: N/A  Psychiatric Specialty Exam: Review of Systems  Psychiatric/Behavioral: Positive for depression (improving). The patient is nervous/anxious (improving).   All other systems reviewed and are negative.   Blood pressure 136/72, pulse 82, temperature 97.8 F (36.6 C), temperature source Oral, weight 179 lb 9.6 oz (81.5 kg).Body mass  index is 31.81 kg/m.  General Appearance: Casual  Eye Contact:  Fair  Speech:  Clear and Coherent  Volume:  Normal  Mood:  Anxious  Affect:  Congruent  Thought Process:  Goal Directed and Descriptions of Associations: Intact  Orientation:  Full (Time, Place, and Person)  Thought Content: Logical   Suicidal Thoughts:  No  Homicidal Thoughts:  No  Memory:  Immediate;   Fair Recent;   Fair Remote;   Fair  Judgement:  Fair  Insight:  Fair  Psychomotor Activity:  Normal  Concentration:  Concentration: Fair and Attention Span: Fair  Recall:  [employer] of Knowledge: Fair  Language: Fair  Akathisia:  No  Handed:  Right  AIMS (if indicated): NA  Assets:  Communication Skills Desire for Improvement Housing Intimacy Social Support  ADL's:  Intact  Cognition: WNL  Sleep:  Fair   Screenings: PHQ2-9     Office Visit from 06/03/2017 in [facility] Office Visit from 01/03/2017 in [facility]  PHQ-2 Total Score  4  0  PHQ-9 Total Score  11  -       Assessment and Plan: Javonne is a 67 year old African-American female who has a hx depression, OCD, OSA , presented to clinic today for a follow up visit. Trenace reports she is stable on the current medication regimen.  She continues to have good support system.  She does report some memory issues.  I have completed an MMSE as noted below.  Will continue plan as noted below.  Plan For depression Continue Effexor XR 150 mg p.o. daily. PHQ 9 =8   OCD Effexor XR 150 mg p.o. daily Ativan 0.5 mg p.o. as needed.  Patient was given 15 pills with 2 refills in January 2019.  She continues to have refills left and has not required Ativan much.  Discussed risk of being on benzodiazepine therapy.  She understands the same.  She will try to restrict use. Reviewed [place] controlled substance database.  Insomnia Currently stable on trazodone 100 mg p.o. nightly as needed She also has OSA.  For memory problems I  have completed an MMSE on the patient today and she scored 28 out of 30. I have reviewed the following labs in EHR vitamin B12-06/03/2017-within normal limits, TSH-02/07/2018-within normal limits, vitamin D-02/07/2018-within normal limits.  Discussed with patient to closely monitor  her memory issues.  Will reassess during follow-up visits.  Follow up in clinic in 5 months or sooner if needed.  More than 50 % of the time was spent for psychoeducation and supportive psychotherapy and care coordination.  This note was generated in part or whole with voice recognition software. Voice recognition is usually quite accurate but there are transcription errors that can and very often do occur. I apologize for any typographical errors that were not detected and corrected.       Ursula Alert, MD 02/19/2018, 9:32 PM

## 2018-02-21 ENCOUNTER — Ambulatory Visit: Payer: Medicare Other | Admitting: Psychiatry

## 2018-02-26 ENCOUNTER — Telehealth: Payer: Self-pay | Admitting: Family Medicine

## 2018-02-26 ENCOUNTER — Other Ambulatory Visit: Payer: Self-pay | Admitting: Family Medicine

## 2018-02-26 DIAGNOSIS — R945 Abnormal results of liver function studies: Principal | ICD-10-CM

## 2018-02-26 DIAGNOSIS — R7989 Other specified abnormal findings of blood chemistry: Secondary | ICD-10-CM

## 2018-02-26 NOTE — Telephone Encounter (Signed)
Ordered.  Please cancel the hepatic function panel that was previously ordered.  She will have the GGT done as well as a CMP.

## 2018-02-26 NOTE — Telephone Encounter (Signed)
Please advise 

## 2018-02-26 NOTE — Telephone Encounter (Signed)
Last OV 02/07/18 last filled 10/23/17 20 0rf Patient states she takes this as needed for when she has dizzy spells which causes nausea.patient would like some refills if possible.

## 2018-02-26 NOTE — Telephone Encounter (Signed)
Copied from [facility]. Topic: Quick Communication - See Telephone Encounter >> Feb 26, 2018 11:02 AM Ivar Drape wrote: CRM for notification. See Telephone encounter for: 02/26/18. Patient will have upcoming labs and she would like her Sugar, Kidneys and Liver checked with those labs.

## 2018-02-27 NOTE — Addendum Note (Signed)
Addended by: Juanda Chance on: 02/27/2018 08:44 AM   Modules accepted: Orders

## 2018-02-27 NOTE — Telephone Encounter (Signed)
cancelled

## 2018-03-03 ENCOUNTER — Other Ambulatory Visit (INDEPENDENT_AMBULATORY_CARE_PROVIDER_SITE_OTHER): Payer: Medicare Other

## 2018-03-03 DIAGNOSIS — R945 Abnormal results of liver function studies: Secondary | ICD-10-CM

## 2018-03-03 DIAGNOSIS — R748 Abnormal levels of other serum enzymes: Secondary | ICD-10-CM | POA: Diagnosis not present

## 2018-03-03 DIAGNOSIS — R7989 Other specified abnormal findings of blood chemistry: Secondary | ICD-10-CM

## 2018-03-03 LAB — COMPREHENSIVE METABOLIC PANEL
ALT: 15 U/L (ref 0–35)
AST: 14 U/L (ref 0–37)
Albumin: 4.1 g/dL (ref 3.5–5.2)
Alkaline Phosphatase: 126 U/L — ABNORMAL HIGH (ref 39–117)
BILIRUBIN TOTAL: 0.3 mg/dL (ref 0.2–1.2)
BUN: 20 mg/dL (ref 6–23)
CALCIUM: 9.4 mg/dL (ref 8.4–10.5)
CO2: 25 meq/L (ref 19–32)
CREATININE: 1.05 mg/dL (ref 0.40–1.20)
Chloride: 105 mEq/L (ref 96–112)
GFR: 67.06 mL/min (ref >60.00)
GLUCOSE: 175 mg/dL — AB (ref 70–99)
Potassium: 3.8 mEq/L (ref 3.5–5.1)
Sodium: 138 mEq/L (ref 135–145)
TOTAL PROTEIN: 6.8 g/dL (ref 6.0–8.3)

## 2018-03-03 LAB — GAMMA GT: GGT: 33 U/L (ref 7–51)

## 2018-03-04 ENCOUNTER — Other Ambulatory Visit: Payer: Self-pay | Admitting: Family Medicine

## 2018-03-04 ENCOUNTER — Other Ambulatory Visit: Payer: Medicare Other

## 2018-03-04 DIAGNOSIS — H25811 Combined forms of age-related cataract, right eye: Secondary | ICD-10-CM | POA: Diagnosis not present

## 2018-03-04 DIAGNOSIS — H268 Other specified cataract: Secondary | ICD-10-CM | POA: Diagnosis not present

## 2018-03-04 DIAGNOSIS — H21561 Pupillary abnormality, right eye: Secondary | ICD-10-CM | POA: Diagnosis not present

## 2018-03-04 DIAGNOSIS — R748 Abnormal levels of other serum enzymes: Secondary | ICD-10-CM

## 2018-04-11 DIAGNOSIS — H35371 Puckering of macula, right eye: Secondary | ICD-10-CM | POA: Diagnosis not present

## 2018-04-29 ENCOUNTER — Telehealth: Payer: Self-pay | Admitting: Cardiovascular Disease

## 2018-04-29 NOTE — Telephone Encounter (Signed)
   [place], MD  Chart reviewed as part of pre-operative protocol coverage.   I do not see any conditions which would require SBE prophylaxis in our system. No history of valve repair/replacement.  Please inform requesting provider.   Charlie Pitter, PA-C 04/29/2018, 5:11 PM

## 2018-04-29 NOTE — Telephone Encounter (Signed)
New message    1. What dental office are you calling from? [facility]   2. What is your office phone number? [phone]  3. What is your fax number? [phone]  4. What type of procedure is the patient having performed? Scaling and Root cleaning   5. What date is procedure scheduled or is the patient there now? Pt had one today and Tammy said they may have already pre medicated her just in case  6. What is your question (ex. Antibiotics prior to procedure, holding medication-we need to know how long dentist wants pt to hold med)? They want to know if she needs to be pre medicated before dental procedure.

## 2018-04-30 NOTE — Telephone Encounter (Signed)
[place] and spoke with Benjamine Mola and made her aware that per Melina Copa, PA-C, that pt doesn't require Abx Prophylaxis before dental cleanings.

## 2018-05-19 ENCOUNTER — Other Ambulatory Visit: Payer: Self-pay | Admitting: Family Medicine

## 2018-05-20 NOTE — Telephone Encounter (Signed)
Last OV 02/07/18 last filled 02/27/18 20 1rf

## 2018-05-22 NOTE — Telephone Encounter (Signed)
Please find out what she needs this medication for.  It is not typically a medication that I give to just have on hand.

## 2018-05-23 ENCOUNTER — Other Ambulatory Visit: Payer: Self-pay | Admitting: Family Medicine

## 2018-05-23 NOTE — Telephone Encounter (Signed)
Pt is calling and states that she is out.

## 2018-05-27 NOTE — Telephone Encounter (Signed)
Left message to return cal, ok for pec to speak to patient and ask what she needs a refill on ondansetron for, this is not typically something that DR.Caryl Bis prescribes

## 2018-05-27 NOTE — Telephone Encounter (Signed)
Pt stated that since 2015, she has periods of time of nausea, dizziness and diarrhea. She stated that these symptoms occur once a month, twice per month, or an episode every other month. Pt stated 2 weeks ago was her last time she had the nausea, dizziness and diarrhea and it came "out of nowhere." Pt stated that the episodes may last 2 hours or up to 2 days.

## 2018-05-28 NOTE — Telephone Encounter (Signed)
fyi

## 2018-05-29 NOTE — Telephone Encounter (Signed)
Noted.  I will provide a refill to have on hand though she needs to be evaluated the next time this occurs so we can see if there is a specific cause.

## 2018-05-30 NOTE — Telephone Encounter (Signed)
Patient notified

## 2018-06-30 ENCOUNTER — Other Ambulatory Visit: Payer: Self-pay

## 2018-06-30 ENCOUNTER — Encounter: Payer: Self-pay | Admitting: Cardiovascular Disease

## 2018-06-30 ENCOUNTER — Ambulatory Visit (INDEPENDENT_AMBULATORY_CARE_PROVIDER_SITE_OTHER): Payer: Medicare Other | Admitting: Cardiovascular Disease

## 2018-06-30 ENCOUNTER — Ambulatory Visit (HOSPITAL_COMMUNITY): Payer: Medicare Other | Attending: Cardiovascular Disease

## 2018-06-30 VITALS — BP 130/80 | HR 69 | Ht 63.0 in | Wt 175.4 lb

## 2018-06-30 DIAGNOSIS — E119 Type 2 diabetes mellitus without complications: Secondary | ICD-10-CM | POA: Insufficient documentation

## 2018-06-30 DIAGNOSIS — I110 Hypertensive heart disease with heart failure: Secondary | ICD-10-CM | POA: Diagnosis not present

## 2018-06-30 DIAGNOSIS — I5022 Chronic systolic (congestive) heart failure: Secondary | ICD-10-CM

## 2018-06-30 DIAGNOSIS — I255 Ischemic cardiomyopathy: Secondary | ICD-10-CM | POA: Insufficient documentation

## 2018-06-30 DIAGNOSIS — I083 Combined rheumatic disorders of mitral, aortic and tricuspid valves: Secondary | ICD-10-CM | POA: Insufficient documentation

## 2018-06-30 DIAGNOSIS — I25119 Atherosclerotic heart disease of native coronary artery with unspecified angina pectoris: Secondary | ICD-10-CM | POA: Diagnosis not present

## 2018-06-30 DIAGNOSIS — E785 Hyperlipidemia, unspecified: Secondary | ICD-10-CM | POA: Diagnosis not present

## 2018-06-30 MED ORDER — CARVEDILOL 12.5 MG PO TABS: 18.7500 mg | ORAL_TABLET | Freq: Two times a day (BID) | ORAL | 0 refills | Status: DC

## 2018-06-30 MED ORDER — EZETIMIBE 10 MG PO TABS: 10.0000 mg | ORAL_TABLET | Freq: Every day | ORAL | 3 refills | Status: DC

## 2018-06-30 MED ORDER — PERFLUTREN LIPID MICROSPHERE
1.0000 mL | INTRAVENOUS | Status: AC | PRN
  Administered 2018-06-30: 2 mL via INTRAVENOUS

## 2018-06-30 NOTE — Progress Notes (Signed)
Cardiology Office Note Date:  06/30/2018   ID:  Melanie Ray, DOB 17-Jan-1951, MRN [id number]  PCP:  Leone Haven, MD  Cardiologist:  Sherren Mocha, MD    Chief Complaint  Patient presents with  . Coronary Artery Disease     History of Present Illness: Melanie Ray is a 67 y.o. female who presents for follow-up evaluation. She has CAD and initially presented in 2017 with an anterior STEMI, treated with overlapping DES in the LAD. She has severe residual LV systolic dysfunction, has declined an ICD and has been unable to tolerate entresto.   The patient is here alone today.  She is been doing well from a symptomatic perspective.  She denies chest pain, chest pressure, or shortness of breath.  She denies leg swelling, heart palpitations, orthopnea, or PND.  She had an echocardiogram done for follow-up earlier this morning.  She notes that she has not been following a prudent diet, nor has she been exercising regularly.  She has been compliant with her medications.  She recently returned from her 50-year class reunion in [place] and had a really good time with her high school friends.  Past Medical History:  Diagnosis Date  . Anemia   . Anxiety   . Back pain   . CAD (coronary artery disease) 04/11/2016   S/p ant STEMI 5/17: LHC >> LAD proximal 80%, mid 80%, distal 50%, ostial D1 60%; LCx with LPDA lesion 30%; RCA Mild calcification with no significant stenosis in a medium caliber, nondominant RCA; LVEF is estimated at 45% with inferoapical and lateral wall akinesis >> PCI: PCI: 3.5 x 24 mm Promus DES to prox LAD, 2.5 x 12 mm Promus DES to mid LAD.   [name] Chest pain   . Chronic systolic CHF (congestive heart failure) ([place]) 03/21/2016   Echo 01/30/17: Diff HK, mild focal basal septal hypertrophy, EF 30-35, mild AI, MAC, mild MR // Echo 06/08/16: Mild focal basal septal hypertrophy, EF 25-30%, diff HK, ant-septal AK, Gr 1 DD, mild AI, MAC, mild MR, PASP 37 mmHg // Echo 03/18/16: EF  30-35%, ant-septal AK, Gr 1 DD, mild MR, severe LAE.  [name] Constipation   . Depression   . Diabetes mellitus type 2 in obese ([place])   . Dizziness   . Glaucoma   . Gout   . Heart disease   . Heartburn   . History of acute anterior wall MI 03/17/2016  . History of heart attack   . Hyperlipemia   . Hypertension   . Ischemic cardiomyopathy 10/02/2016   Refused ICD  . Joint pain   . Left bundle branch block   . Myocardial infarction ([place])   . Nausea   . Sleep apnea   . SOB (shortness of breath)   . Swelling    feet or legs    Past Surgical History:  Procedure Laterality Date  . APPENDECTOMY    . CARDIAC CATHETERIZATION N/A 03/17/2016   Procedure: Left Heart Cath and Coronary Angiography;  Surgeon: Sherren Mocha, MD;  Location: [facility];  Service: Cardiovascular;  Laterality: N/A;  . CARDIAC CATHETERIZATION N/A 03/17/2016   Procedure: Coronary Stent Intervention;  Surgeon: Sherren Mocha, MD;  Location: [facility];  Service: Cardiovascular;  Laterality: N/A;  . COLONOSCOPY WITH PROPOFOL N/A 08/13/2017   Procedure: COLONOSCOPY WITH PROPOFOL;  Surgeon: Jonathon Bellows, MD;  [facility];  Service: Gastroenterology;  Laterality: N/A;  . SMALL BOWEL REPAIR    . TONSILLECTOMY    .  UTERINE FIBROID SURGERY      Current Outpatient Medications  Medication Sig Dispense Refill  . aspirin EC 81 MG EC tablet Take 1 tablet (81 mg total) by mouth daily.    [name] atorvastatin (LIPITOR) 80 MG tablet Take 1 tablet (80 mg total) by mouth daily. 90 tablet 3  . carvedilol (COREG) 12.5 MG tablet Take 1 1/2 tabs by mouth twice daily (Patient taking differently: 2 (two) times daily with a meal. ) 90 tablet 4  . dorzolamide-timolol (COSOPT) 22.3-6.8 MG/ML ophthalmic solution Place 1 drop into both eyes 2 (two) times daily.     . furosemide (LASIX) 40 MG tablet Take 1 tablet (40 mg total) by mouth as directed. 40 mg on Mon, Wed and Fri's; all other days 20 mg 65 tablet 3  . isosorbide  mononitrate (IMDUR) 30 MG 24 hr tablet Take 1 tablet (30 mg total) by mouth daily. 90 tablet 1  . LORazepam (ATIVAN) 0.5 MG tablet Take 1 tablet (0.5 mg total) by mouth daily as needed for anxiety. 15 tablet 2  . metFORMIN (GLUCOPHAGE) 500 MG tablet TAKE 1 TABLET BY MOUTH TWICE DAILY WITH MEALS 180 tablet 0  . nitroGLYCERIN (NITROSTAT) 0.4 MG SL tablet Place 1 tablet (0.4 mg total) under the tongue every 5 (five) minutes x 3 doses as needed for chest pain (Do not exceed 3 doses). 25 tablet 2  . olmesartan (BENICAR) 20 MG tablet Take 1 tablet (20 mg total) by mouth daily. 30 tablet 11  . ondansetron (ZOFRAN) 4 MG tablet TAKE 1 TABLET BY MOUTH ONCE DAILY AS NEEDED FOR NAUSEA AND VOMITING 20 tablet 1  . spironolactone (ALDACTONE) 25 MG tablet Take 0.5 tablets (12.5 mg total) by mouth daily. 45 tablet 1  . traZODone (DESYREL) 100 MG tablet Take 1 tablet (100 mg total) by mouth at bedtime as needed for sleep. Pt has supply 30 tablet 1  . venlafaxine XR (EFFEXOR-XR) 150 MG 24 hr capsule Take 1 capsule (150 mg total) by mouth daily. 90 capsule 1  . Vitamin D, Ergocalciferol, (DRISDOL) 50000 units CAPS capsule Take 1 capsule (50,000 Units total) by mouth every 7 (seven) days. 8 capsule 0   No current facility-administered medications for this visit.    Facility-Administered Medications Ordered in Other Visits  Medication Dose Route Frequency Provider Last Rate Last Dose  . perflutren lipid microspheres (DEFINITY) IV suspension  1-10 mL Intravenous PRN Skeet Latch, MD   2 mL at 06/30/18 6720    Allergies:   Tetracycline and Meperidine   Social History:  The patient  reports that she quit smoking about 21 years ago. Her smoking use included cigarettes. She has never used smokeless tobacco. She reports that she drinks about 1.0 standard drinks of alcohol per week. She reports that she does not use drugs.   Family History:  The patient's family history includes Anxiety disorder in her maternal aunt  and mother; Dementia in her mother; Depression in her mother; Diabetes in her mother; Drug abuse in her cousin; Heart disease in her mother; Heart failure in her mother; High Cholesterol in her father and mother; Hyperlipidemia in her mother; Hypertension in her father and mother; Mood Disorder in her sister; Paranoid behavior in her mother; Stroke in her mother and sister; Tuberculosis in her paternal grandfather.    ROS:  Please see the history of present illness.    All other systems are reviewed and negative.    PHYSICAL EXAM: VS:  BP 130/80   Pulse  69   Ht _0  (1.6 m)   Wt 175 lb 6.4 oz (79.6 kg)   SpO2 97%   BMI 31.07 kg/m  , BMI Body mass index is 31.07 kg/m. GEN: Well nourished, well developed, in no acute distress  HEENT: normal  Neck: no JVD, no masses. No carotid bruits Cardiac: RRR without murmur or gallop                Respiratory:  clear to auscultation bilaterally, normal work of breathing GI: soft, nontender, nondistended, + BS MS: no deformity or atrophy  Ext: no pretibial edema, pedal pulses 2+= bilaterally Skin: warm and dry, no rash Neuro:  Strength and sensation are intact Psych: euthymic mood, full affect  EKG:  EKG is not ordered today.  Recent Labs: 02/07/2018: Hemoglobin 12.0; Platelets 359.0; TSH 1.41 03/03/2018: ALT 15; BUN 20; Creatinine, Ser 1.05; Potassium 3.8; Sodium 138   Lipid Panel     Component Value Date/Time   CHOL 124 03/21/2017 1022   TRIG 114 03/21/2017 1022   HDL 36 (L) 03/21/2017 1022   CHOLHDL 3.4 03/21/2017 1022   CHOLHDL 4 01/03/2017 0947   VLDL 22.6 01/03/2017 0947   LDLCALC 65 03/21/2017 1022   LDLDIRECT 78.0 02/07/2018 0928      Wt Readings from Last 3 Encounters:  06/30/18 175 lb 6.4 oz (79.6 kg)  02/07/18 181 lb 6.4 oz (82.3 kg)  12/04/17 188 lb 12.8 oz (85.6 kg)     Cardiac Studies Reviewed: Today's echo study is reviewed.  The formal interpretation is currently pending.  There is segmental LV dysfunction  consistent with the patient's history of anterolateral MI.  Definity contrast is used and I would estimate the LVEF at approximately 35% which is unchanged from her previous assessment.  There is mild AI and MR.  ASSESSMENT AND PLAN: 1.  Chronic systolic heart failure, New York Heart Association functional class I-II symptoms.  The patient's medical program is reviewed.  Her echocardiogram is reviewed and I do not appreciate any significant changes from previous studies.  She has an anteroseptal and anteroapical area of akinesis consistent with her known history of myocardial infarction.  She has declined EP referral or consideration of ICD.  I have recommended increasing carvedilol to 18.75 mg twice daily x2 weeks, then if tolerated up to 25 mg twice daily.  She continues on Spironolactone and Benicar.  2.  Coronary artery disease, native vessel, with angina: She remains on aspirin for antiplatelet therapy, high intensity statin drug, and a long-acting nitrate.  She did have one episode of chest pain about 4 weeks ago that she feels was likely related to some food that she ate.  She is had no recurrence.  She will keep an eye out for symptoms.  3.  Mixed hyperlipidemia: I reviewed her most recent labs with an LDL cholesterol of 78 mg/dL.  We discussed the importance of lifestyle modification with diet and exercise.  Advised that she add Zetia 10 mg daily to her regimen and repeat lipids and LFTs in 3 months.  4.  Hypertension: Blood pressure is controlled on current regimen.  She will increase carvedilol as outlined.  Current medicines are reviewed with the patient today.  The patient does not have concerns regarding medicines.  Labs/ tests ordered today include:  No orders of the defined types were placed in this encounter.   Disposition:   FU 6 months with APP  Signed, Sherren Mocha, MD  06/30/2018 9:56 AM  [facility] [place], [place], [place]   [address] Phone: [phone]; Fax: [phone]

## 2018-06-30 NOTE — Patient Instructions (Signed)
Medication Instructions:  1) INCREASE COREG to 18.75 mg twice daily for 2 weeks. I will call you in 2 weeks to see how you are feeling and to increase your medication to 25 mg twice daily if you are tolerating well. 2) START ZETIA 10 mg daily  Labwork: Your provider recommends that you return for FASTING lab work in: 3 months.    Testing/Procedures: None  Follow-Up: Your provider wants you to follow-up in: 6 months with Richardson Dopp, PA.  You will receive a reminder letter in the mail two months in advance. If you don't receive a letter, please call our office to schedule the follow-up appointment.    Any Other Special Instructions Will Be Listed Below (If Applicable).     If you need a refill on your cardiac medications before your next appointment, please call your pharmacy.

## 2018-07-14 DIAGNOSIS — M10071 Idiopathic gout, right ankle and foot: Secondary | ICD-10-CM | POA: Diagnosis not present

## 2018-07-16 ENCOUNTER — Telehealth: Payer: Self-pay

## 2018-07-16 MED ORDER — CARVEDILOL 12.5 MG PO TABS: 12.5000 mg | ORAL_TABLET | Freq: Two times a day (BID) | ORAL | 3 refills | Status: DC

## 2018-07-16 NOTE — Telephone Encounter (Signed)
Per Dr. Antionette Char last [place] note from 8/26, "1.  Chronic systolic heart failure, New York Heart Association functional class I-II symptoms.  The patient's medical program is reviewed.  Her echocardiogram is reviewed and I do not appreciate any significant changes from previous studies.  She has an anteroseptal and anteroapical area of akinesis consistent with her known history of myocardial infarction.  She has declined EP referral or consideration of ICD.  I have recommended increasing carvedilol to 18.75 mg twice daily x2 weeks, then if tolerated up to 25 mg twice daily."   Called to check on patient and to see how she's doing on 18.75 mg BID of Coreg. She states she never increased the medication and does not intend to because she feels fine. Discussed how the medication can help delay progression of her HF but she is uninterested in higher dose. She does not monitor her BP. Instructed patient to continue Coreg 12.5 mg BID.  Updated Rx sent.  Dr. Burt Knack notified.

## 2018-07-25 DIAGNOSIS — H401134 Primary open-angle glaucoma, bilateral, indeterminate stage: Secondary | ICD-10-CM | POA: Diagnosis not present

## 2018-07-25 DIAGNOSIS — H25813 Combined forms of age-related cataract, bilateral: Secondary | ICD-10-CM | POA: Diagnosis not present

## 2018-07-25 DIAGNOSIS — H35371 Puckering of macula, right eye: Secondary | ICD-10-CM | POA: Diagnosis not present

## 2018-07-25 DIAGNOSIS — H524 Presbyopia: Secondary | ICD-10-CM | POA: Diagnosis not present

## 2018-08-12 ENCOUNTER — Other Ambulatory Visit: Payer: Self-pay

## 2018-08-12 ENCOUNTER — Other Ambulatory Visit: Payer: Self-pay | Admitting: Cardiovascular Disease

## 2018-08-12 MED ORDER — ISOSORBIDE MONONITRATE ER 30 MG PO TB24: 30.0000 mg | ORAL_TABLET | Freq: Every day | ORAL | 2 refills | Status: DC

## 2018-08-12 NOTE — Telephone Encounter (Signed)
Outpatient Medication Detail    Disp Refills Start End   isosorbide mononitrate (IMDUR) 30 MG 24 hr tablet 90 tablet 2 08/12/2018    Sig - Route: Take 1 tablet (30 mg total) by mouth daily. - Oral   Sent to pharmacy as: isosorbide mononitrate (IMDUR) 30 MG 24 hr tablet   E-Prescribing Status: Receipt confirmed by pharmacy (08/12/2018 12:07 PM EDT)   Pharmacy   [facility] [place], [place]

## 2018-08-27 ENCOUNTER — Ambulatory Visit: Payer: Medicare Other | Admitting: Psychiatry

## 2018-08-27 ENCOUNTER — Emergency Department
Admission: EM | Admit: 2018-08-27 | Discharge: 2018-08-27 | Disposition: A | Discharge status: Home / Self Care | Payer: Medicare Other | Attending: Emergency Medicine | Admitting: Emergency Medicine

## 2018-08-27 ENCOUNTER — Encounter: Payer: Self-pay | Admitting: Emergency Medicine

## 2018-08-27 ENCOUNTER — Ambulatory Visit (INDEPENDENT_AMBULATORY_CARE_PROVIDER_SITE_OTHER): Payer: Medicare Other | Admitting: Psychiatry

## 2018-08-27 ENCOUNTER — Encounter: Payer: Self-pay | Admitting: Psychiatry

## 2018-08-27 ENCOUNTER — Other Ambulatory Visit: Payer: Self-pay

## 2018-08-27 VITALS — BP 126/73 | HR 101

## 2018-08-27 DIAGNOSIS — F429 Obsessive-compulsive disorder, unspecified: Secondary | ICD-10-CM

## 2018-08-27 DIAGNOSIS — F419 Anxiety disorder, unspecified: Secondary | ICD-10-CM | POA: Insufficient documentation

## 2018-08-27 DIAGNOSIS — Z87891 Personal history of nicotine dependence: Secondary | ICD-10-CM | POA: Diagnosis not present

## 2018-08-27 DIAGNOSIS — F331 Major depressive disorder, recurrent, moderate: Secondary | ICD-10-CM

## 2018-08-27 DIAGNOSIS — F428 Other obsessive-compulsive disorder: Secondary | ICD-10-CM | POA: Diagnosis not present

## 2018-08-27 DIAGNOSIS — Z7984 Long term (current) use of oral hypoglycemic drugs: Secondary | ICD-10-CM | POA: Diagnosis not present

## 2018-08-27 DIAGNOSIS — I25119 Atherosclerotic heart disease of native coronary artery with unspecified angina pectoris: Secondary | ICD-10-CM | POA: Diagnosis not present

## 2018-08-27 DIAGNOSIS — I2510 Atherosclerotic heart disease of native coronary artery without angina pectoris: Secondary | ICD-10-CM | POA: Diagnosis not present

## 2018-08-27 DIAGNOSIS — I5022 Chronic systolic (congestive) heart failure: Secondary | ICD-10-CM | POA: Diagnosis not present

## 2018-08-27 DIAGNOSIS — I252 Old myocardial infarction: Secondary | ICD-10-CM | POA: Insufficient documentation

## 2018-08-27 DIAGNOSIS — Z7982 Long term (current) use of aspirin: Secondary | ICD-10-CM | POA: Insufficient documentation

## 2018-08-27 DIAGNOSIS — E119 Type 2 diabetes mellitus without complications: Secondary | ICD-10-CM | POA: Insufficient documentation

## 2018-08-27 DIAGNOSIS — Z79899 Other long term (current) drug therapy: Secondary | ICD-10-CM | POA: Insufficient documentation

## 2018-08-27 DIAGNOSIS — R45851 Suicidal ideations: Secondary | ICD-10-CM

## 2018-08-27 DIAGNOSIS — F422 Mixed obsessional thoughts and acts: Secondary | ICD-10-CM | POA: Diagnosis not present

## 2018-08-27 DIAGNOSIS — I110 Hypertensive heart disease with heart failure: Secondary | ICD-10-CM | POA: Diagnosis not present

## 2018-08-27 HISTORY — DX: Obsessive-compulsive disorder, unspecified: F42.9

## 2018-08-27 HISTORY — DX: Suicidal ideations: R45.851

## 2018-08-27 LAB — CBC
HCT: 37.6 % (ref 36.0–46.0)
Hemoglobin: 12.2 g/dL (ref 12.0–15.0)
MCH: 26.7 pg (ref 26.0–34.0)
MCHC: 32.4 g/dL (ref 30.0–36.0)
MCV: 82.3 fL (ref 80.0–100.0)
NRBC: 0 % (ref 0.0–0.2)
PLATELETS: 456 10*3/uL — AB (ref 150–400)
RBC: 4.57 MIL/uL (ref 3.87–5.11)
RDW: 13.9 % (ref 11.5–15.5)
WBC: 9.5 10*3/uL (ref 4.0–10.5)

## 2018-08-27 LAB — COMPREHENSIVE METABOLIC PANEL
ALT: 35 U/L (ref 0–44)
AST: 53 U/L — ABNORMAL HIGH (ref 15–41)
Albumin: 4.7 g/dL (ref 3.5–5.0)
Alkaline Phosphatase: 132 U/L — ABNORMAL HIGH (ref 38–126)
Anion gap: 14 (ref 5–15)
BUN: 33 mg/dL — ABNORMAL HIGH (ref 8–23)
CHLORIDE: 106 mmol/L (ref 98–111)
CO2: 18 mmol/L — ABNORMAL LOW (ref 22–32)
CREATININE: 1.25 mg/dL — AB (ref 0.44–1.00)
Calcium: 10.2 mg/dL (ref 8.9–10.3)
GFR calc Af Amer: 50 mL/min — ABNORMAL LOW (ref >60)
GFR, EST NON AFRICAN AMERICAN: 43 mL/min — AB (ref >60)
Glucose, Bld: 117 mg/dL — ABNORMAL HIGH (ref 70–99)
Potassium: 4.1 mmol/L (ref 3.5–5.1)
Sodium: 138 mmol/L (ref 135–145)
TOTAL PROTEIN: 8.2 g/dL — AB (ref 6.5–8.1)
Total Bilirubin: 1.2 mg/dL (ref 0.3–1.2)

## 2018-08-27 LAB — ETHANOL

## 2018-08-27 LAB — SALICYLATE LEVEL: Salicylate Lvl: <7 mg/dL (ref 2.8–30.0)

## 2018-08-27 LAB — ACETAMINOPHEN LEVEL: Acetaminophen (Tylenol), Serum: <10 ug/mL — ABNORMAL LOW (ref 10–30)

## 2018-08-27 MED ORDER — LORAZEPAM 1 MG PO TABS
1.0000 mg | ORAL_TABLET | Freq: Once | ORAL | Status: AC
  Administered 2018-08-27: 1 mg via ORAL
  Filled 2018-08-27: qty 1

## 2018-08-27 MED ORDER — VENLAFAXINE HCL ER 150 MG PO CP24: 300.0000 mg | ORAL_CAPSULE | Freq: Every day | ORAL | 1 refills | Status: DC

## 2018-08-27 MED ORDER — OLANZAPINE 10 MG PO TABS: 10.0000 mg | ORAL_TABLET | Freq: Every day | ORAL | 0 refills | Status: DC

## 2018-08-27 NOTE — Consult Note (Signed)
  Psychiatry: Patient not seen per current protocol.  Verbal information includes the word "suicide".  Patient must be admitted to the first psychiatric hospital that is available.

## 2018-08-27 NOTE — ED Triage Notes (Addendum)
PT to ED via POV with spouse , pt tearful in triage. Pt states she has OCD and has been very anxious, sad, depressed x1wk. Denies any noncompliance with meds. PT A&OX4. PT states she is SI with plan to take sleeping pills. VSS . Has been hospitalized in the past for psych dx

## 2018-08-27 NOTE — ED Notes (Signed)
Pt. Returned phone she used to talk to family member.  Pt. Appears calm and happy after phone call.  Pt. Asked about going home.  MD informed of patients request.

## 2018-08-27 NOTE — ED Notes (Signed)
Pt. Given phone to call husband for ride.

## 2018-08-27 NOTE — ED Provider Notes (Signed)
[facility] Emergency Department Provider Note  ____________________________________________  Time seen: Approximately 11:15 AM  I have reviewed the triage vital signs and the nursing notes.   HISTORY  Chief Complaint No chief complaint on file.    HPI Laiza Veenstra is a 67 y.o. female with past medical history of anxiety, depression, OCD, CAD hypertension diabetes who reports that over the past week she has been having recurrence of her OCD symptoms that she had after the death of her sister-in-law several years ago.  She reports taking 6-9 showers a day, washing her hands 50-70 times a day, throwing away all of her clothing and linens  because she was worried they were contaminated.  She feels more depressed and she is thought about killing herself because her life is out of control.  She feels very overwhelmed and described her life as "torture."  Denies HI or hallucinations.    No drug abuse or overdose, although she does note that over the past week she has increased her Effexor on her own from the once daily prescribed to taking it 2 or 3 times a day so that she ran out 2 days ago.     Past Medical History:  Diagnosis Date  . Anemia   . Anxiety   . Back pain   . CAD (coronary artery disease) 04/11/2016   S/p ant STEMI 5/17: LHC >> LAD proximal 80%, mid 80%, distal 50%, ostial D1 60%; LCx with LPDA lesion 30%; RCA Mild calcification with no significant stenosis in a medium caliber, nondominant RCA; LVEF is estimated at 45% with inferoapical and lateral wall akinesis >> PCI: PCI: 3.5 x 24 mm Promus DES to prox LAD, 2.5 x 12 mm Promus DES to mid LAD.   [name] Chest pain   . Chronic systolic CHF (congestive heart failure) ([place]) 03/21/2016   Echo 01/30/17: Diff HK, mild focal basal septal hypertrophy, EF 30-35, mild AI, MAC, mild MR // Echo 06/08/16: Mild focal basal septal hypertrophy, EF 25-30%, diff HK, ant-septal AK, Gr 1 DD, mild AI, MAC, mild MR, PASP 37 mmHg //  Echo 03/18/16: EF 30-35%, ant-septal AK, Gr 1 DD, mild MR, severe LAE.  [name] Constipation   . Depression   . Diabetes mellitus type 2 in obese ([place])   . Dizziness   . Glaucoma   . Gout   . Heart disease   . Heartburn   . History of acute anterior wall MI 03/17/2016  . History of heart attack   . Hyperlipemia   . Hypertension   . Ischemic cardiomyopathy 10/02/2016   Refused ICD  . Joint pain   . Left bundle branch block   . Myocardial infarction ([place])   . Nausea   . Sleep apnea   . SOB (shortness of breath)   . Swelling    feet or legs     Patient Active Problem List   Diagnosis Date Noted  . Skin lesion 02/07/2018  . Hot flash, menopausal 02/07/2018  . Postmenopausal 07/02/2017  . Vitamin D deficiency 06/17/2017  . Class 1 obesity without serious comorbidity with body mass index (BMI) of 30.0 to 30.9 in adult 06/17/2017  . Great toe pain, left 05/17/2017  . Hot flashes 04/23/2017  . Dizziness 01/03/2017  . Ischemic cardiomyopathy 10/02/2016  . CAD (coronary artery disease) 04/11/2016  . Chronic systolic CHF (congestive heart failure) ([place]) 03/21/2016  . History of acute anterior wall MI 03/18/2016  . DM type 2 (diabetes mellitus, type 2) ([place])  08/03/2015  . Essential hypertension 07/28/2015  . HLD (hyperlipidemia) 07/28/2015  . Glaucoma 07/28/2015  . Depression 07/28/2015  . Anxiety 07/28/2015  . Insomnia 07/28/2015  . Obesity (BMI 30.0-34.9) 07/28/2015  . 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 Polyp of colon 04/27/2008  . Uterine leiomyoma 04/27/2008  . Generalized anxiety disorder 04/27/2008  . History of colonoscopy 04/27/2008  . Iron deficiency anemia, unspecified 04/27/2008  . Osteopenia 04/27/2008  . Other specified disorder of bladder 04/27/2008     Past Surgical History:  Procedure Laterality Date  . APPENDECTOMY    . CARDIAC CATHETERIZATION N/A 03/17/2016   Procedure: Left Heart Cath and Coronary Angiography;  Surgeon: Sherren Mocha, MD;   Location: [place] [facility];  Service: Cardiovascular;  Laterality: N/A;  . CARDIAC CATHETERIZATION N/A 03/17/2016   Procedure: Coronary Stent Intervention;  Surgeon: Sherren Mocha, MD;  Location: [place] [facility];  Service: Cardiovascular;  Laterality: N/A;  . COLONOSCOPY WITH PROPOFOL N/A 08/13/2017   Procedure: COLONOSCOPY WITH PROPOFOL;  Surgeon: Jonathon Bellows, MD;  Location: [facility] ENDOSCOPY;  Service: Gastroenterology;  Laterality: N/A;  . SMALL BOWEL REPAIR    . TONSILLECTOMY    . UTERINE FIBROID SURGERY       Prior to Admission medications   Medication Sig Start Date End Date Taking? Authorizing Provider  aspirin EC 81 MG EC tablet Take 1 tablet (81 mg total) by mouth daily. 03/21/16   Lyda Jester M, PA-C  atorvastatin (LIPITOR) 80 MG tablet Take 1 tablet (80 mg total) by mouth daily. 12/30/17   Sherren Mocha, MD  carvedilol (COREG) 12.5 MG tablet Take 1 tablet (12.5 mg total) by mouth 2 (two) times daily with a meal. 07/16/18 07/11/19  Sherren Mocha, MD  dorzolamide-timolol (COSOPT) 22.3-6.8 MG/ML ophthalmic solution Place 1 drop into both eyes 2 (two) times daily.  01/17/15   [provider]  ezetimibe (ZETIA) 10 MG tablet Take 1 tablet (10 mg total) by mouth daily. 06/30/18 06/25/19  Sherren Mocha, MD  furosemide (LASIX) 40 MG tablet Take 1 tablet (40 mg total) by mouth as directed. 40 mg on Mon, Wed and Fri's; all other days 20 mg 12/04/17 11/29/18  Sherren Mocha, MD  indomethacin (INDOCIN) 50 MG capsule indomethacin 50 mg capsule  Take 1 capsule every 8 hours by oral route.    [provider]  isosorbide mononitrate (IMDUR) 30 MG 24 hr tablet Take 1 tablet (30 mg total) by mouth daily. 08/12/18   Sherren Mocha, MD  LORazepam (ATIVAN) 0.5 MG tablet Take 1 tablet (0.5 mg total) by mouth daily as needed for anxiety. 11/20/17   Ursula Alert, MD  metFORMIN (GLUCOPHAGE) 500 MG tablet TAKE 1 TABLET BY MOUTH TWICE DAILY WITH MEALS 05/23/18   Leone Haven, MD   moxifloxacin (VIGAMOX) 0.5 % ophthalmic solution INSTILL 1 DROP INTO LEFT EYE 4 TIMES DAILY (START TWO DAYS PRIOR TO SURGERY AND INSTILL TWO DROPS THE MORNING OF SURGERY 08/04/18   [provider]  nitroGLYCERIN (NITROSTAT) 0.4 MG SL tablet Place 1 tablet (0.4 mg total) under the tongue every 5 (five) minutes x 3 doses as needed for chest pain (Do not exceed 3 doses). 05/23/17   Sherren Mocha, MD  olmesartan (BENICAR) 20 MG tablet Take 1 tablet (20 mg total) by mouth daily. 01/10/18 01/05/19  Sherren Mocha, MD  ondansetron (ZOFRAN) 4 MG tablet TAKE 1 TABLET BY MOUTH ONCE DAILY AS NEEDED FOR NAUSEA AND VOMITING 05/29/18   Leone Haven, MD  prednisoLONE acetate (PRED FORTE)  1 % ophthalmic suspension INSTILL 1 DROP INTO LEFT EYE 4 TIMES DAILY 08/04/18   [provider]  PROLENSA 0.07 % SOLN INSTILL 1 DROP INTO LEFT EYE ONCE DAILY (BEGIN ONE DAY BEFORE SURGERY) 08/04/18   [provider]  spironolactone (ALDACTONE) 25 MG tablet Take 0.5 tablets (12.5 mg total) by mouth daily. 02/07/18   Leone Haven, MD  traZODone (DESYREL) 100 MG tablet Take 1 tablet (100 mg total) by mouth at bedtime as needed for sleep. Pt has supply 03/11/17   Rainey Pines, MD  venlafaxine XR (EFFEXOR-XR) 150 MG 24 hr capsule Take 1 capsule (150 mg total) by mouth daily. 02/19/18   Ursula Alert, MD  Vitamin D, Ergocalciferol, (DRISDOL) 50000 units CAPS capsule Take 1 capsule (50,000 Units total) by mouth every 7 (seven) days. 10/23/17   Coral Spikes, DO     Allergies Tetracycline and Meperidine   Family History  Problem Relation Age of Onset  . Anxiety disorder Mother   . Paranoid behavior Mother   . Hypertension Mother   . Heart failure Mother   . Stroke Mother   . Dementia Mother   . High Cholesterol Mother   . Diabetes Mother   . Hyperlipidemia Mother   . Heart disease Mother   . Depression Mother   . Hypertension Father   . High Cholesterol Father   . Mood Disorder Sister   .  Stroke Sister   . Anxiety disorder Maternal Aunt   . Drug abuse Cousin   . Tuberculosis Paternal Grandfather     Social History Social History   Tobacco Use  . Smoking status: Former Smoker    Types: Cigarettes    Last attempt to quit: 04/13/1997    Years since quitting: 21.3  . Smokeless tobacco: Never Used  Substance Use Topics  . Alcohol use: Yes    Alcohol/week: 1.0 standard drinks    Types: 1 Glasses of wine per week    Comment: 2-3 glasses of wine per month  . Drug use: No    Review of Systems  Constitutional:   No fever or chills.  ENT:   No sore throat. No rhinorrhea. Cardiovascular:   No chest pain or syncope. Respiratory:   No dyspnea or cough. Gastrointestinal:   Negative for abdominal pain, vomiting and diarrhea.  Musculoskeletal:   Negative for focal pain or swelling All other systems reviewed and are negative except as documented above in ROS and HPI.  ____________________________________________   PHYSICAL EXAM:  VITAL SIGNS: ED Triage Vitals  Enc Vitals Group     BP 08/27/18 1010 (!) 142/61     Pulse Rate 08/27/18 1010 92     Resp 08/27/18 1010 (!) 22     Temp 08/27/18 1010 98.1 F (36.7 C)     Temp Source 08/27/18 1010 Oral     SpO2 08/27/18 1010 97 %     Weight --      Height --      Head Circumference --      Peak Flow --      Pain Score 08/27/18 1014 0     Pain Loc --      Pain Edu? --      Excl. in [place]? --     Vital signs reviewed, nursing assessments reviewed.   Constitutional:   Alert and oriented. Non-toxic appearance.  Tearful Eyes:   Conjunctivae are normal. EOMI. PERRL. ENT      Head:   Normocephalic and atraumatic.  Nose:   No congestion/rhinnorhea.       Mouth/Throat:   MMM, no pharyngeal erythema. No peritonsillar mass.       Neck:   No meningismus. Full ROM. Hematological/Lymphatic/Immunilogical:   No cervical lymphadenopathy. Cardiovascular:   RRR. Symmetric bilateral radial and DP pulses.  No murmurs. Cap refill  less than 2 seconds. Respiratory:   Normal respiratory effort without tachypnea/retractions. Breath sounds are clear and equal bilaterally. No wheezes/rales/rhonchi. Gastrointestinal:   Soft and nontender. Non distended. There is no CVA tenderness.  No rebound, rigidity, or guarding. Genitourinary:   deferred Musculoskeletal:   Normal range of motion in all extremities. No joint effusions.  No lower extremity tenderness.  No edema. Neurologic:   Normal speech and language.  Motor grossly intact. No acute focal neurologic deficits are appreciated.  Skin:    Skin is warm, dry and intact. No rash noted.  No petechiae, purpura, or bullae.  ____________________________________________    LABS (pertinent positives/negatives) (all labs ordered are listed, but only abnormal results are displayed) Labs Reviewed  COMPREHENSIVE METABOLIC PANEL - Abnormal; Notable for the following components:      Result Value   CO2 18 (*)    Glucose, Bld 117 (*)    BUN 33 (*)    Creatinine, Ser 1.25 (*)    Total Protein 8.2 (*)    AST 53 (*)    Alkaline Phosphatase 132 (*)    GFR calc non Af Amer 43 (*)    GFR calc Af Amer 50 (*)    All other components within normal limits  CBC - Abnormal; Notable for the following components:   Platelets 456 (*)    All other components within normal limits  ETHANOL  SALICYLATE LEVEL  ACETAMINOPHEN LEVEL  URINE DRUG SCREEN, QUALITATIVE ([facility] ONLY)   ____________________________________________   EKG    ____________________________________________    RADIOLOGY  No results found.  ____________________________________________   PROCEDURES Procedures  ____________________________________________    CLINICAL IMPRESSION / ASSESSMENT AND PLAN / ED COURSE  Pertinent labs & imaging results that were available during my care of the patient were reviewed by me and considered in my medical decision making (see chart for details).    Patient presents with  depression and OCD symptoms.  No acute medical complaints.  Vital signs are unremarkable.  She is medically stable to proceed with psychiatric evaluation.  TTS consulted.  Oral Ativan 1 mg for anxiolysis.      ____________________________________________   FINAL CLINICAL IMPRESSION(S) / ED DIAGNOSES    Final diagnoses:  Suicidal ideation  Other obsessive-compulsive disorders     ED Discharge Orders    None      Portions of this note were generated with dragon dictation software. Dictation errors may occur despite best attempts at proofreading.    [name], MD 08/27/18 1118

## 2018-08-27 NOTE — ED Provider Notes (Signed)
-----------------------------------------   7:34 PM on 08/27/2018 -----------------------------------------   Blood pressure (!) 142/61, pulse 92, temperature 98.1 F (36.7 C), temperature source Oral, resp. rate (!) 22, SpO2 97 %.  Patient has been evaluated by psychiatry and cleared for discharge. IVC lifted by Dr. Weber Cooks. Patient's labs have been reviewed with no acute findings. Patient will be discharged at this time to home    Alfred Levins, [place], MD 08/27/18 1934

## 2018-08-27 NOTE — ED Notes (Signed)
Patients husband brought pt. Change of clothes

## 2018-08-27 NOTE — ED Notes (Signed)
Pt requested that her husband be notified that he can come back and visit her during visiting hours.  Called husband's cell phone, but voicemail immediately came on.

## 2018-08-27 NOTE — ED Notes (Signed)
Pt speaking with TTS at this time.Pt requesting to use the phone to call her nail salon and cancel an appointment that she had for today.

## 2018-08-27 NOTE — BH Assessment (Signed)
Received referral call from [place] regarding this patient. Record reviewed. Discussed with Dr. Dwyane Dee, who also reviewed patient is currently denying any suicidal ideation, or plans. Dr. Dwyane Dee recommends patient be re-evaluated by Dr. Weber Cooks for potential discharge due to lack of inpatient criteria. This was relayed with [place] TTS.

## 2018-08-27 NOTE — ED Notes (Signed)
Pt given portable phone to speak to sister. Per EDP, pt to be discharged

## 2018-08-27 NOTE — ED Notes (Signed)
Pt dressing out at this time, belongings sent with spouse.

## 2018-08-27 NOTE — Discharge Instructions (Signed)
You have been seen in the Emergency Department (ED)  today for a psychiatric complaint.  You have been evaluated by psychiatry and we believe you are safe to be discharged from the hospital.   ° °Please return to the Emergency Department (ED)  immediately if you have ANY thoughts of hurting yourself or anyone else, so that we may help you. ° °Please avoid alcohol and drug use. ° °Follow up with your doctor and/or therapist as soon as possible regarding today's ED  visit.  ° °You may call crisis hotline for [place] at [phone]. ° °

## 2018-08-27 NOTE — ED Notes (Addendum)
Pt states she is here because since last Thursday she has been having OCD traits.  She states she has taken at least 6-9 showers a day.  She states she repeatedly washes her hands and her hair.  She states that she has thrown away a lot of clothes because of her OCD.  Pt is tearful and upset while speaking to staff.  She states that she has thrown away clothes, shoes, and dish towels.  Pt states she normally sleeps downstairs in a separate room from her husband, but during the last week she has slept upstairs over the past week with her husband.  She states she does this because she is afraid.  She states at times she is in the bathroom bathing herself or her wigs for over 3 hours.  She says that her OCD is "Ruining my life.  I can't function."  When asked what meds she takes for her anxiety and depression, Pt states she takes Effexor, Trazodone, and Ativan PRN.  When asked how often she takes the Ativan pt states, "For about the past week, I've been taking 2 or 3 a day."  Pt states that her psychiatric at [facility] recommended that she come to the ED to be evaluated.

## 2018-08-27 NOTE — ED Notes (Signed)
Pt  vol 

## 2018-08-27 NOTE — Progress Notes (Signed)
[facility] Progress Note  08/27/2018 10:10 AM Melanie Ray  MRN:  [id number]  Chief Complaint: ' I am here for follow up.'  HPI: Melanie Ray is a 67 year old African-American female who presented to the clinic today for a follow-up visit.  Patient has a history of MDD, OCD. Patient today presented to the clinic extremely late for her appointment.  Patient was found to be crying out loud, possibly having panic symptoms in the lobby.  Patient reports she has not been doing well since the past few days.  She reports it started out a few days ago when few of her clothes touched the bathroom door which she associates with her sister-in-law's death.  She reports she had to wash these clothes several times and eventually throw it in the trash.  Patient reports her problems started from then.  She reports her husband was taking out some clothes to wash another day and she noticed that he touched that same bathroom door .  She reports she threw out all those clothes also in the trash.  Patient reports her anxiety symptoms started getting worse and she was afraid to be in her own bedroom or the living room or the kitchen anymore.  She reports what ever she touched she had to clean or throw away.  She hence threw away several of her shoes, towels, bed sheets and so on.  She reports the only safe place for her now is her husband's bedroom.  She reports if she stays in her bedroom at night she cannot sleep and hence she has been staying in her husband's bedroom.  She reports when she stays there she can sleep well.  Her husband also contributed collateral information.  He reports her symptoms as getting worse since the past few days.  He confirmed what she reported.  He reports she has been compliant with her medications.  Patient reports her symptoms have reached a point now that she feels like she wants to die.  Discussed with patient that she may need possible inpatient mental health admission due to worsening  OCD/anxiety symptoms which is impairing her ability to function on a regular basis as well as her suicidal thoughts.  Patient as well as husband agreed with plan.    Visit Diagnosis:    ICD-10-CM   1. Obsessive-compulsive disorder with good or fair insight F42.9   2. Major depressive disorder, recurrent episode, moderate ([place]) F33.1     Past Psychiatric History: Reviewed past psychiatric history from my progress note on 02/19/2018. Pt had several suicide attempts by overdose in the past.  Pt used to follow up with Dr. Gretel Acre in the past.  This is her third visit with Probation officer.  Past Medical History:  Past Medical History:  Diagnosis Date  . Anemia   . Anxiety   . Back pain   . CAD (coronary artery disease) 04/11/2016   S/p ant STEMI 5/17: LHC >> LAD proximal 80%, mid 80%, distal 50%, ostial D1 60%; LCx with LPDA lesion 30%; RCA Mild calcification with no significant stenosis in a medium caliber, nondominant RCA; LVEF is estimated at 45% with inferoapical and lateral wall akinesis >> PCI: PCI: 3.5 x 24 mm Promus DES to prox LAD, 2.5 x 12 mm Promus DES to mid LAD.   [name] Chest pain   . Chronic systolic CHF (congestive heart failure) ([place]) 03/21/2016   Echo 01/30/17: Diff HK, mild focal basal septal hypertrophy, EF 30-35, mild AI, MAC, mild MR // Echo  06/08/16: Mild focal basal septal hypertrophy, EF 25-30%, diff HK, ant-septal AK, Gr 1 DD, mild AI, MAC, mild MR, PASP 37 mmHg // Echo 03/18/16: EF 30-35%, ant-septal AK, Gr 1 DD, mild MR, severe LAE.  [name] Constipation   . Depression   . Diabetes mellitus type 2 in obese ([place])   . Dizziness   . Glaucoma   . Gout   . Heart disease   . Heartburn   . History of acute anterior wall MI 03/17/2016  . History of heart attack   . Hyperlipemia   . Hypertension   . Ischemic cardiomyopathy 10/02/2016   Refused ICD  . Joint pain   . Left bundle branch block   . Myocardial infarction ([place])   . Nausea   . Sleep apnea   . SOB (shortness of breath)   .  Swelling    feet or legs    Past Surgical History:  Procedure Laterality Date  . APPENDECTOMY    . CARDIAC CATHETERIZATION N/A 03/17/2016   Procedure: Left Heart Cath and Coronary Angiography;  Surgeon: Sherren Mocha, MD;  Location: [facility];  Service: Cardiovascular;  Laterality: N/A;  . CARDIAC CATHETERIZATION N/A 03/17/2016   Procedure: Coronary Stent Intervention;  Surgeon: Sherren Mocha, MD;  Location: [facility];  Service: Cardiovascular;  Laterality: N/A;  . COLONOSCOPY WITH PROPOFOL N/A 08/13/2017   Procedure: COLONOSCOPY WITH PROPOFOL;  Surgeon: Jonathon Bellows, MD;  Location: [facility];  Service: Gastroenterology;  Laterality: N/A;  . SMALL BOWEL REPAIR    . TONSILLECTOMY    . UTERINE FIBROID SURGERY      Family Psychiatric History: Reviewed family psychiatric history from my progress note on 02/19/2018  Family History:  Family History  Problem Relation Age of Onset  . Anxiety disorder Mother   . Paranoid behavior Mother   . Hypertension Mother   . Heart failure Mother   . Stroke Mother   . Dementia Mother   . High Cholesterol Mother   . Diabetes Mother   . Hyperlipidemia Mother   . Heart disease Mother   . Depression Mother   . Hypertension Father   . High Cholesterol Father   . Mood Disorder Sister   . Stroke Sister   . Anxiety disorder Maternal Aunt   . Drug abuse Cousin   . Tuberculosis Paternal Grandfather     Social History: Reviewed social history from my progress note on 02/19/2018. Social History   Socioeconomic History  . Marital status: Married    Spouse name: Not on file  . Number of children: 0  . Years of education: BS in education  . Highest education level: Not on file  Occupational History  . Occupation:  Retired Education officer, museum  Social Needs  . Financial resource strain: Not on file  . Food insecurity:    Worry: Not on file    Inability: Not on file  . Transportation needs:    Medical: Not on file    Non-medical:  Not on file  Tobacco Use  . Smoking status: Former Smoker    Types: Cigarettes    Last attempt to quit: 04/13/1997    Years since quitting: 21.3  . Smokeless tobacco: Never Used  Substance and Sexual Activity  . Alcohol use: Yes    Alcohol/week: 1.0 standard drinks    Types: 1 Glasses of wine per week    Comment: 2-3 glasses of wine per month  . Drug use: No  . Sexual activity: Never  Lifestyle  . Physical activity:    Days per week: Not on file    Minutes per session: Not on file  . Stress: Not on file  Relationships  . Social connections:    Talks on phone: Not on file    Gets together: Not on file    Attends religious service: Not on file    Active member of club or organization: Not on file    Attends meetings of clubs or organizations: Not on file    Relationship status: Not on file  Other Topics Concern  . Not on file  Social History Narrative   Lives in [place] with spouse.  No children.   Retired first [employer] for over 30 years ([place] for 10 years and then in [place] for over 20 years).   Left-handed    Allergies:  Allergies  Allergen Reactions  . Tetracycline Swelling  . Meperidine Nausea And Vomiting    Nausea    Metabolic Disorder Labs: Lab Results  Component Value Date   HGBA1C 7.4 (H) 02/07/2018   MPG 131 03/18/2016   No results found for: PROLACTIN Lab Results  Component Value Date   CHOL 124 03/21/2017   TRIG 114 03/21/2017   HDL 36 (L) 03/21/2017   CHOLHDL 3.4 03/21/2017   VLDL 22.6 01/03/2017   LDLCALC 65 03/21/2017   LDLCALC 72 01/03/2017   Lab Results  Component Value Date   TSH 1.41 02/07/2018   TSH 0.934 06/03/2017    Therapeutic Level Labs: No results found for: LITHIUM No results found for: VALPROATE No components found for:  CBMZ  Current Medications: Current Outpatient Medications  Medication Sig Dispense Refill  . aspirin EC 81 MG EC tablet Take 1 tablet (81 mg total) by mouth daily.    [name] atorvastatin  (LIPITOR) 80 MG tablet Take 1 tablet (80 mg total) by mouth daily. 90 tablet 3  . carvedilol (COREG) 12.5 MG tablet Take 1 tablet (12.5 mg total) by mouth 2 (two) times daily with a meal. 180 tablet 3  . dorzolamide-timolol (COSOPT) 22.3-6.8 MG/ML ophthalmic solution Place 1 drop into both eyes 2 (two) times daily.     [name] ezetimibe (ZETIA) 10 MG tablet Take 1 tablet (10 mg total) by mouth daily. 90 tablet 3  . furosemide (LASIX) 40 MG tablet Take 1 tablet (40 mg total) by mouth as directed. 40 mg on Mon, Wed and Fri's; all other days 20 mg 65 tablet 3  . indomethacin (INDOCIN) 50 MG capsule indomethacin 50 mg capsule  Take 1 capsule every 8 hours by oral route.    . isosorbide mononitrate (IMDUR) 30 MG 24 hr tablet Take 1 tablet (30 mg total) by mouth daily. 90 tablet 2  . LORazepam (ATIVAN) 0.5 MG tablet Take 1 tablet (0.5 mg total) by mouth daily as needed for anxiety. 15 tablet 2  . metFORMIN (GLUCOPHAGE) 500 MG tablet TAKE 1 TABLET BY MOUTH TWICE DAILY WITH MEALS 180 tablet 0  . moxifloxacin (VIGAMOX) 0.5 % ophthalmic solution INSTILL 1 DROP INTO LEFT EYE 4 TIMES DAILY (START TWO DAYS PRIOR TO SURGERY AND INSTILL TWO DROPS THE MORNING OF SURGERY  0  . nitroGLYCERIN (NITROSTAT) 0.4 MG SL tablet Place 1 tablet (0.4 mg total) under the tongue every 5 (five) minutes x 3 doses as needed for chest pain (Do not exceed 3 doses). 25 tablet 2  . olmesartan (BENICAR) 20 MG tablet Take 1 tablet (20 mg total) by mouth daily. 30 tablet 11  .  ondansetron (ZOFRAN) 4 MG tablet TAKE 1 TABLET BY MOUTH ONCE DAILY AS NEEDED FOR NAUSEA AND VOMITING 20 tablet 1  . prednisoLONE acetate (PRED FORTE) 1 % ophthalmic suspension INSTILL 1 DROP INTO LEFT EYE 4 TIMES DAILY  0  . PROLENSA 0.07 % SOLN INSTILL 1 DROP INTO LEFT EYE ONCE DAILY (BEGIN ONE DAY BEFORE SURGERY)  0  . spironolactone (ALDACTONE) 25 MG tablet Take 0.5 tablets (12.5 mg total) by mouth daily. 45 tablet 1  . traZODone (DESYREL) 100 MG tablet Take 1 tablet  (100 mg total) by mouth at bedtime as needed for sleep. Pt has supply 30 tablet 1  . venlafaxine XR (EFFEXOR-XR) 150 MG 24 hr capsule Take 1 capsule (150 mg total) by mouth daily. 90 capsule 1  . Vitamin D, Ergocalciferol, (DRISDOL) 50000 units CAPS capsule Take 1 capsule (50,000 Units total) by mouth every 7 (seven) days. 8 capsule 0   No current facility-administered medications for this visit.      Musculoskeletal: Strength & Muscle Tone: within normal limits Gait & Station: normal Patient leans: N/A  Psychiatric Specialty Exam: Review of Systems  Psychiatric/Behavioral: Positive for depression and suicidal ideas. The patient is nervous/anxious.   All other systems reviewed and are negative.   Blood pressure 126/73, pulse (!) 101.There is no height or weight on file to calculate BMI.  General Appearance: Casual  Eye Contact:  Fair  Speech:  Normal Rate  Volume:  Increased  Mood:  Anxious, Depressed and Dysphoric  Affect:  Labile and Tearful  Thought Process:  Goal Directed and Descriptions of Associations: Circumstantial  Orientation:  Other:  Person place, self, situation  Thought Content: Obsessions and Rumination   Suicidal Thoughts:  Yes.  without intent/plan  Homicidal Thoughts:  No  Memory:  Immediate;   Fair Recent;   Fair Remote;   Fair  Judgement:  Fair  Insight:  Fair  Psychomotor Activity:  Increased  Concentration:  Concentration: Fair and Attention Span: Fair  Recall:  [employer] of Knowledge: Fair  Language: Fair  Akathisia:  No  Handed:  Right  AIMS (if indicated): na  Assets:  Communication Skills Desire for Improvement Social Support  ADL's:  Intact  Cognition: WNL  Sleep:  Fair   Screenings: PHQ2-9     Office Visit from 06/03/2017 in [facility] Office Visit from 01/03/2017 in [facility]  PHQ-2 Total Score  4  0  PHQ-9 Total Score  11  -       Assessment and Plan: Melanie Ray is a 67 year old  African-American female who has a history of depression, OCD, OSA  , presented to the clinic today for a follow-up visit.  Patient today presented with severe anxiety symptoms and was noted as crying throughout the  evaluation.  Patient reports her OCD symptoms As getting worse to the point that it is impairing her ability to function.  She also reports suicidal thoughts.  She does not think her medications as effective anymore.  Patient agrees to go to the emergency department for possible evaluation for inpatient admission.  Patient as well as her husband Mr. Lanier Clam who is here with her today agrees with plan.  Janett Billow CMA to escort patient to the emergency department.  Plan  Patient to be escorted to the emergency department for possible evaluation for inpatient mental health admission due to her recent decompensation.Pt had several suicide attempts by overdose in the past. Pt hence will benefit from IP admission  due to her recent worsening anxiety symptoms as well as SI.  More than 50 % of the time was spent for psychoeducation and supportive psychotherapy and care coordination. This note was generated in part or whole with voice recognition software. Voice recognition is usually quite accurate but there are transcription errors that can and very often do occur. I apologize for any typographical errors that were not detected and corrected.       Ursula Alert, MD 08/27/2018, 10:10 AM

## 2018-08-27 NOTE — ED Notes (Signed)
Pt's husband in to visit with patient.  Patient relations staff supervising visit at this time.

## 2018-08-27 NOTE — ED Notes (Signed)
Pt. Informed this nurse that husband was bringing clothes for her to change into, upon discharge.

## 2018-08-27 NOTE — ED Notes (Signed)
First Nurse Note: Patient to ED via [facility] from [facility].  Crying.  Accompanied by husband.

## 2018-08-27 NOTE — Consult Note (Signed)
[facility] Consult   Reason for Consult: Consult for this patient with obsessive-compulsive disorder who has had a dramatic worsening of symptoms this month Referring Physician: Alfred Levins Patient Identification: Melanie Ray MRN:  [id number] Principal Diagnosis: Obsessive compulsive disorder Diagnosis:   Patient Active Problem List   Diagnosis Date Noted  . Obsessive compulsive disorder [F42.9] 08/27/2018  . Suicidal ideation [R45.851] 08/27/2018  . Skin lesion [L98.9] 02/07/2018  . Hot flash, menopausal [N95.1] 02/07/2018  . Postmenopausal [Z78.0] 07/02/2017  . Vitamin D deficiency [E55.9] 06/17/2017  . Class 1 obesity without serious comorbidity with body mass index (BMI) of 30.0 to 30.9 in adult [E66.9, Z68.30] 06/17/2017  . Great toe pain, left [M79.675] 05/17/2017  . Hot flashes [R23.2] 04/23/2017  . Dizziness [R42] 01/03/2017  . Ischemic cardiomyopathy [I25.5] 10/02/2016  . CAD (coronary artery disease) [I25.10] 04/11/2016  . Chronic systolic CHF (congestive heart failure) ([place]) [I50.22] 03/21/2016  . History of acute anterior wall MI [I25.2] 03/18/2016  . DM type 2 (diabetes mellitus, type 2) ([place]) [E11.9] 08/03/2015  . Essential hypertension [I10] 07/28/2015  . HLD (hyperlipidemia) [E78.5] 07/28/2015  . Glaucoma [H40.9] 07/28/2015  . Depression [F32.9] 07/28/2015  . Anxiety [F41.9] 07/28/2015  . Insomnia [G47.00] 07/28/2015  . Obesity (BMI 30.0-34.9) [E66.9] 07/28/2015  . 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 Polyp of colon [K63.5] 04/27/2008  . Uterine leiomyoma [D25.9] 04/27/2008  . Generalized anxiety disorder [F41.1] 04/27/2008  . History of colonoscopy [Z98.890] 04/27/2008  . Iron deficiency anemia, unspecified [D50.9] 04/27/2008  . Osteopenia [M85.80] 04/27/2008  . Other specified disorder of bladder [N32.89] 04/27/2008    Total Time spent with patient: 1 hour  Subjective:   Melanie Ray is a 67 y.o. female patient  admitted with "things have just gotten worse in the last month".  HPI: Patient seen chart reviewed.  Reviewed notes from outpatient providers.  This 67 year old woman developed obsessive-compulsive disorder in 01-15-2011 after the death of a sister-in-law.  Things got very bad for a while and she got psychiatric treatment and eventually got her symptoms under good control with the Effexor.  She had been stable on 150 mg of Effexor for the last several years.  This past month for unclear reasons her symptoms have returned very dramatically.  Patient describes how in the last month she has become completely consumed by obsessions of cleanliness and fear of contamination.  It is taking over her house and her routine.  She feels tormented by it much of the time.  She admits that she has developed suicidal thoughts but she insists that she does not have any intention or plan of hurting herself.  Patient is lucid and has a good understanding of the illness even though she can be very emotional about it.  She has been compliant with medicine.  This past week she has increased on her own initiative her Effexor dose to 300 mg a day and tolerated it.  Social history: Patient is married.  Husband appears to be extremely supportive and helpful.  The only part of the house she feels safe in his his bedroom and he is going to have her stay there for the time being.  Medical history: Diabetes controlled with oral medicine  Substance abuse history: No history of abuse drinks only occasionally not drinking excessively.  Past Psychiatric History: Patient developed OCD in January 15, 2011.  It happened after the death of his sister-in-law.  She developed severe contamination fears around the idea of death and dead bodies.  She  eventually got symptoms under control.  She has never been hospitalized.  Patient says that she has had suicide attempts in the past years ago.  She had struggled with depression earlier in her life.  She states that  now however even though suicidal thoughts come to her she feels that she has absolutely no thoughts of completing the act because of the love of her extended family.  Risk to Self: Suicidal Ideation: Yes-Currently Present Suicidal Intent: Yes-Currently Present Is patient at risk for suicide?: Yes Suicidal Plan?: Yes-Currently Present Specify Current Suicidal Plan: To overdose on sleeping pills Access to Means: Yes Specify Access to Suicidal Means: Access to prescription medication What has been your use of drugs/alcohol within the last 12 months?: None How many times?: 4 Other Self Harm Risks: None Triggers for Past Attempts: Other (Comment)(Death of in-law) Intentional Self Injurious Behavior: None Risk to Others: Homicidal Ideation: No Thoughts of Harm to Others: No Current Homicidal Intent: No Current Homicidal Plan: No Access to Homicidal Means: No Identified Victim: N/A History of harm to others?: No Assessment of Violence: None Noted Violent Behavior Description: N/A Does patient have access to weapons?: No Criminal Charges Pending?: No Does patient have a court date: No Prior Inpatient Therapy: Prior Inpatient Therapy: Yes Prior Therapy Dates: "67yo, 67yo, 1992, and 1994" Prior Therapy Facilty/Provider(s): Hospitals located in [place] Reason for Treatment: OCD, anxiety Prior Outpatient Therapy: Prior Outpatient Therapy: Yes Prior Therapy Dates: Current Prior Therapy Facilty/Provider(s): [facility] Reason for Treatment: OCD, anxiety Does patient have an ACCT team?: No Does patient have Intensive In-House Services?  : No Does patient have [facility] services? : No Does patient have P4CC services?: No  Past Medical History:  Past Medical History:  Diagnosis Date  . Anemia   . Anxiety   . Back pain   . CAD (coronary artery disease) 04/11/2016   S/p ant STEMI 5/17: LHC >> LAD proximal 80%, mid 80%, distal 50%, ostial D1 60%; LCx with LPDA lesion 30%; RCA Mild  calcification with no significant stenosis in a medium caliber, nondominant RCA; LVEF is estimated at 45% with inferoapical and lateral wall akinesis >> PCI: PCI: 3.5 x 24 mm Promus DES to prox LAD, 2.5 x 12 mm Promus DES to mid LAD.   [name] Chest pain   . Chronic systolic CHF (congestive heart failure) ([place]) 03/21/2016   Echo 01/30/17: Diff HK, mild focal basal septal hypertrophy, EF 30-35, mild AI, MAC, mild MR // Echo 06/08/16: Mild focal basal septal hypertrophy, EF 25-30%, diff HK, ant-septal AK, Gr 1 DD, mild AI, MAC, mild MR, PASP 37 mmHg // Echo 03/18/16: EF 30-35%, ant-septal AK, Gr 1 DD, mild MR, severe LAE.  [name] Constipation   . Depression   . Diabetes mellitus type 2 in obese ([place])   . Dizziness   . Glaucoma   . Gout   . Heart disease   . Heartburn   . History of acute anterior wall MI 03/17/2016  . History of heart attack   . Hyperlipemia   . Hypertension   . Ischemic cardiomyopathy 10/02/2016   Refused ICD  . Joint pain   . Left bundle branch block   . Myocardial infarction ([place])   . Nausea   . Sleep apnea   . SOB (shortness of breath)   . Swelling    feet or legs    Past Surgical History:  Procedure Laterality Date  . APPENDECTOMY    . CARDIAC CATHETERIZATION N/A 03/17/2016   Procedure: Left  Heart Cath and Coronary Angiography;  Surgeon: Sherren Mocha, MD;  Location: [facility];  Service: Cardiovascular;  Laterality: N/A;  . CARDIAC CATHETERIZATION N/A 03/17/2016   Procedure: Coronary Stent Intervention;  Surgeon: Sherren Mocha, MD;  Location: [facility];  Service: Cardiovascular;  Laterality: N/A;  . COLONOSCOPY WITH PROPOFOL N/A 08/13/2017   Procedure: COLONOSCOPY WITH PROPOFOL;  Surgeon: Jonathon Bellows, MD;  Location: [facility];  Service: Gastroenterology;  Laterality: N/A;  . SMALL BOWEL REPAIR    . TONSILLECTOMY    . UTERINE FIBROID SURGERY     Family History:  Family History  Problem Relation Age of Onset  . Anxiety disorder Mother   . Paranoid  behavior Mother   . Hypertension Mother   . Heart failure Mother   . Stroke Mother   . Dementia Mother   . High Cholesterol Mother   . Diabetes Mother   . Hyperlipidemia Mother   . Heart disease Mother   . Depression Mother   . Hypertension Father   . High Cholesterol Father   . Mood Disorder Sister   . Stroke Sister   . Anxiety disorder Maternal Aunt   . Drug abuse Cousin   . Tuberculosis Paternal Grandfather    Family Psychiatric  History: Unknown Social History:  Social History   Substance and Sexual Activity  Alcohol Use Yes  . Alcohol/week: 1.0 standard drinks  . Types: 1 Glasses of wine per week   Comment: 2-3 glasses of wine per month     Social History   Substance and Sexual Activity  Drug Use No    Social History   Socioeconomic History  . Marital status: Married    Spouse name: Not on file  . Number of children: 0  . Years of education: BS in education  . Highest education level: Not on file  Occupational History  . Occupation:  Retired Education officer, museum  Social Needs  . Financial resource strain: Not on file  . Food insecurity:    Worry: Not on file    Inability: Not on file  . Transportation needs:    Medical: Not on file    Non-medical: Not on file  Tobacco Use  . Smoking status: Former Smoker    Types: Cigarettes    Last attempt to quit: 04/13/1997    Years since quitting: 21.3  . Smokeless tobacco: Never Used  Substance and Sexual Activity  . Alcohol use: Yes    Alcohol/week: 1.0 standard drinks    Types: 1 Glasses of wine per week    Comment: 2-3 glasses of wine per month  . Drug use: No  . Sexual activity: Never  Lifestyle  . Physical activity:    Days per week: Not on file    Minutes per session: Not on file  . Stress: Not on file  Relationships  . Social connections:    Talks on phone: Not on file    Gets together: Not on file    Attends religious service: Not on file    Active member of club or organization: Not on file     Attends meetings of clubs or organizations: Not on file    Relationship status: Not on file  Other Topics Concern  . Not on file  Social History Narrative   Lives in [place] with spouse.  No children.   Retired [employer] for over 30 years ([employer] for 10 years and then in [place] for over 20 years).  Left-handed   Additional Social History:    Allergies:   Allergies  Allergen Reactions  . Tetracycline Swelling  . Meperidine Nausea And Vomiting    Nausea    Labs:  Results for orders placed or performed during the hospital encounter of 08/27/18 (from the past 48 hour(s))  Comprehensive metabolic panel     Status: Abnormal   Collection Time: 08/27/18 10:16 AM  Result Value Ref Range   Sodium 138 135 - 145 mmol/L   Potassium 4.1 3.5 - 5.1 mmol/L   Chloride 106 98 - 111 mmol/L   CO2 18 (L) 22 - 32 mmol/L   Glucose, Bld 117 (H) 70 - 99 mg/dL   BUN 33 (H) 8 - 23 mg/dL   Creatinine, Ser 1.25 (H) 0.44 - 1.00 mg/dL   Calcium 10.2 8.9 - 10.3 mg/dL   Total Protein 8.2 (H) 6.5 - 8.1 g/dL   Albumin 4.7 3.5 - 5.0 g/dL   AST 53 (H) 15 - 41 U/L   ALT 35 0 - 44 U/L   Alkaline Phosphatase 132 (H) 38 - 126 U/L   Total Bilirubin 1.2 0.3 - 1.2 mg/dL   GFR calc non Af Amer 43 (L) >60 mL/min   GFR calc Af Amer 50 (L) >60 mL/min    Comment: (NOTE) The eGFR has been calculated using the CKD EPI equation. This calculation has not been validated in all clinical situations. eGFR's persistently <60 mL/min signify possible Chronic Kidney Disease.    Anion gap 14 5 - 15    Comment: Performed at [facility], [address] [place], [place] [address]  Ethanol     Status: None   Collection Time: 08/27/18 10:16 AM  Result Value Ref Range   Alcohol, Ethyl (B) <10 <10 mg/dL    Comment: (NOTE) Lowest detectable limit for serum alcohol is 10 mg/dL. For medical purposes only. Performed at [facility], [address] [place], [place] [address]   Salicylate  level     Status: None   Collection Time: 08/27/18 10:16 AM  Result Value Ref Range   Salicylate Lvl <9.6 2.8 - 30.0 mg/dL    Comment: Performed at [facility], [address] [place], [place] [address]  Acetaminophen level     Status: Abnormal   Collection Time: 08/27/18 10:16 AM  Result Value Ref Range   Acetaminophen (Tylenol), Serum <10 (L) 10 - 30 ug/mL    Comment: (NOTE) Therapeutic concentrations vary significantly. A range of 10-30 ug/mL  may be an effective concentration for many patients. However, some  are best treated at concentrations outside of this range. Acetaminophen concentrations >150 ug/mL at 4 hours after ingestion  and >50 ug/mL at 12 hours after ingestion are often associated with  toxic reactions. Performed at [facility], [address] [place], [place] [address]   cbc     Status: Abnormal   Collection Time: 08/27/18 10:16 AM  Result Value Ref Range   WBC 9.5 4.0 - 10.5 K/uL   RBC 4.57 3.87 - 5.11 MIL/uL   Hemoglobin 12.2 12.0 - 15.0 g/dL   HCT 37.6 36.0 - 46.0 %   MCV 82.3 80.0 - 100.0 fL   MCH 26.7 26.0 - 34.0 pg   MCHC 32.4 30.0 - 36.0 g/dL   RDW 13.9 11.5 - 15.5 %   Platelets 456 (H) 150 - 400 K/uL   nRBC 0.0 0.0 - 0.2 %    Comment: Performed at [facility], [address] [place], [place] [address]    No current facility-administered medications for this encounter.    Current Outpatient Medications  Medication Sig Dispense Refill  . aspirin EC 81 MG EC tablet Take 1 tablet (81 mg total) by mouth daily.    [name] atorvastatin (LIPITOR) 80 MG tablet Take 1 tablet (80 mg total) by mouth daily. 90 tablet 3  . carvedilol (COREG) 12.5 MG tablet Take 1 tablet (12.5 mg total) by mouth 2 (two) times daily with a meal. 180 tablet 3  . dorzolamide-timolol (COSOPT) 22.3-6.8 MG/ML ophthalmic solution Place 1 drop into both eyes 2 (two) times daily.     [name] ezetimibe (ZETIA) 10 MG tablet Take 1 tablet (10 mg total) by mouth  daily. 90 tablet 3  . furosemide (LASIX) 40 MG tablet Take 1 tablet (40 mg total) by mouth as directed. 40 mg on Mon, Wed and Fri's; all other days 20 mg 65 tablet 3  . indomethacin (INDOCIN) 50 MG capsule indomethacin 50 mg capsule  Take 1 capsule every 8 hours by oral route.    . isosorbide mononitrate (IMDUR) 30 MG 24 hr tablet Take 1 tablet (30 mg total) by mouth daily. 90 tablet 2  . LORazepam (ATIVAN) 0.5 MG tablet Take 1 tablet (0.5 mg total) by mouth daily as needed for anxiety. 15 tablet 2  . metFORMIN (GLUCOPHAGE) 500 MG tablet TAKE 1 TABLET BY MOUTH TWICE DAILY WITH MEALS 180 tablet 0  . moxifloxacin (VIGAMOX) 0.5 % ophthalmic solution INSTILL 1 DROP INTO LEFT EYE 4 TIMES DAILY (START TWO DAYS PRIOR TO SURGERY AND INSTILL TWO DROPS THE MORNING OF SURGERY  0  . nitroGLYCERIN (NITROSTAT) 0.4 MG SL tablet Place 1 tablet (0.4 mg total) under the tongue every 5 (five) minutes x 3 doses as needed for chest pain (Do not exceed 3 doses). 25 tablet 2  . OLANZapine (ZYPREXA) 10 MG tablet Take 1 tablet (10 mg total) by mouth at bedtime. 30 tablet 0  . olmesartan (BENICAR) 20 MG tablet Take 1 tablet (20 mg total) by mouth daily. 30 tablet 11  . ondansetron (ZOFRAN) 4 MG tablet TAKE 1 TABLET BY MOUTH ONCE DAILY AS NEEDED FOR NAUSEA AND VOMITING 20 tablet 1  . prednisoLONE acetate (PRED FORTE) 1 % ophthalmic suspension INSTILL 1 DROP INTO LEFT EYE 4 TIMES DAILY  0  . PROLENSA 0.07 % SOLN INSTILL 1 DROP INTO LEFT EYE ONCE DAILY (BEGIN ONE DAY BEFORE SURGERY)  0  . spironolactone (ALDACTONE) 25 MG tablet Take 0.5 tablets (12.5 mg total) by mouth daily. 45 tablet 1  . traZODone (DESYREL) 100 MG tablet Take 1 tablet (100 mg total) by mouth at bedtime as needed for sleep. Pt has supply 30 tablet 1  . venlafaxine XR (EFFEXOR-XR) 150 MG 24 hr capsule Take 2 capsules (300 mg total) by mouth daily. 60 capsule 1  . Vitamin D, Ergocalciferol, (DRISDOL) 50000 units CAPS capsule Take 1 capsule (50,000 Units total)  by mouth every 7 (seven) days. 8 capsule 0    Musculoskeletal: Strength & Muscle Tone: within normal limits Gait & Station: normal Patient leans: N/A  Psychiatric Specialty Exam: Physical Exam  Nursing note and vitals reviewed. Constitutional: She appears well-developed and well-nourished.  HENT:  Head: Normocephalic and atraumatic.  Eyes: Pupils are equal, round, and reactive to light. Conjunctivae are normal.  Neck: Normal range of motion.  Cardiovascular: Regular rhythm and normal heart sounds.  Respiratory: Effort normal. No respiratory distress.  GI: Soft.  Musculoskeletal:  Normal range of motion.  Neurological: She is alert.  Skin: Skin is warm and dry.  Psychiatric: Her speech is normal. Judgment normal. Her mood appears anxious. She is agitated. She is not aggressive. Thought content is not paranoid. Cognition and memory are normal. She expresses suicidal ideation. She expresses no homicidal ideation. She expresses no suicidal plans.    Review of Systems  Constitutional: Negative.   HENT: Negative.   Eyes: Negative.   Respiratory: Negative.   Cardiovascular: Negative.   Gastrointestinal: Negative.   Musculoskeletal: Negative.   Skin: Negative.   Neurological: Negative.   Psychiatric/Behavioral: Positive for depression and suicidal ideas. Negative for hallucinations, memory loss and substance abuse. The patient is nervous/anxious. The patient does not have insomnia.     Blood pressure (!) 142/61, pulse 92, temperature 98.1 F (36.7 C), temperature source Oral, resp. rate (!) 22, SpO2 97 %.There is no height or weight on file to calculate BMI.  General Appearance: Fairly Groomed  Eye Contact:  Fair  Speech:  Clear and Coherent  Volume:  Normal  Mood:  Anxious and Dysphoric  Affect:  Appropriate and Full Range  Thought Process:  Coherent  Orientation:  Full (Time, Place, and Person)  Thought Content:  Logical, Obsessions, Rumination and Tangential  Suicidal  Thoughts:  Yes.  without intent/plan  Homicidal Thoughts:  No  Memory:  Immediate;   Fair Recent;   Fair Remote;   Fair  Judgement:  Fair  Insight:  Fair  Psychomotor Activity:  Normal  Concentration:  Concentration: Fair  Recall:  [employer] of Knowledge:  Fair  Language:  Fair  Akathisia:  No  Handed:  Right  AIMS (if indicated):     Assets:  Desire for Improvement Housing Physical Health Resilience Social Support  ADL's:  Intact  Cognition:  WNL  Sleep:        Treatment Plan Summary: Medication management and Plan Patient with severe OCD but not psychotic and has good insight into her condition.  There was a lot of concern about the suicidal ideation.  We talked about this at some length.  Patient is realistic about it but says that a lot of specific factors related to her extended family make her feel certain she would never actually try to kill herself.  She has been pretty convincing on this point since being in the emergency room.  She is cooperative and very agreeable to outpatient treatment.  We talked about medication management.  Since she is tolerating the 300 mg of Effexor I approved that change and have given her a prescription for the higher dose.  I also suggested that we add a antipsychotic dose of olanzapine at night.  Side effects including blood sugar increases discussed.  Patient will be given these prescriptions and will be directed to get back in touch with her outpatient provider as soon as possible.  She is agreeable to the plan.  Case reviewed with emergency room doctor.  Disposition: No evidence of imminent risk to self or others at present.   Patient does not meet criteria for psychiatric inpatient admission. Supportive therapy provided about ongoing stressors. Discussed crisis plan, support from social network, calling 911, coming to the Emergency Department, and calling Suicide Hotline.  Alethia Berthold, MD 08/27/2018 7:09 PM

## 2018-08-27 NOTE — BH Assessment (Signed)
Assessment Note  Melanie Ray is an 67 y.o. female who presents to ED voluntarily after an appointment today with [facility]. Pt had a labile affect as she explained her reason for being recommended for further evaluation. She reports "I have OCD and it's gotten really bad this past week. It all started in 2012. I have a bathroom door and I don't touch the door. I was washing clothes and the clothes brushed up against the door. I became extremely afraid. I washed all the clothes in the drawer and it just escalated from there. I take 6-8 showers a day. I go through a box of clorox wipes in 2 days because I'm constantly wiping things down. There are so many places in my house that I'm afraid to touch. And I've thrown away so many clothes and wash cloths." Pt was crying hysterically as she was explaining these events. She denied current thoughts of suicide; however, she reported she began having intrusive suicidal thoughts when her OCD behaviors started to spiral beyond her control. She states "I don't want to kill myself because it would hurt my family and my nephew Melanie Ray". She reports having 4 suicidal attempts in the past when she was "67yo, 67yo, in 1992, and 1994". She stated these attempts were by overdosing on "sleeping pills". Pt has hx of inpatient psychiatric tx when she lived in [place]. She reports being admitted in 2012 to [facility] in [place] for anxiety sxs. She denied HI/AVH. She also denied past/current alcohol/substance use.  She further reports having poor sleep patterns over the last week - unable to stay asleep. She is prescribed Effexor, Ativan, and Trazodone but reports these medications "have not been working" for the past week.  Diagnosis:  Obsessive Compulsive Disorder Anxiety Disorder, Severe  Past Medical History:  Past Medical History:  Diagnosis Date  . Anemia   . Anxiety   . Back pain   . CAD (coronary artery disease) 04/11/2016   S/p ant STEMI  5/17: LHC >> LAD proximal 80%, mid 80%, distal 50%, ostial D1 60%; LCx with LPDA lesion 30%; RCA Mild calcification with no significant stenosis in a medium caliber, nondominant RCA; LVEF is estimated at 45% with inferoapical and lateral wall akinesis >> PCI: PCI: 3.5 x 24 mm Promus DES to prox LAD, 2.5 x 12 mm Promus DES to mid LAD.   [name] Chest pain   . Chronic systolic CHF (congestive heart failure) ([place]) 03/21/2016   Echo 01/30/17: Diff HK, mild focal basal septal hypertrophy, EF 30-35, mild AI, MAC, mild MR // Echo 06/08/16: Mild focal basal septal hypertrophy, EF 25-30%, diff HK, ant-septal AK, Gr 1 DD, mild AI, MAC, mild MR, PASP 37 mmHg // Echo 03/18/16: EF 30-35%, ant-septal AK, Gr 1 DD, mild MR, severe LAE.  [name] Constipation   . Depression   . Diabetes mellitus type 2 in obese ([place])   . Dizziness   . Glaucoma   . Gout   . Heart disease   . Heartburn   . History of acute anterior wall MI 03/17/2016  . History of heart attack   . Hyperlipemia   . Hypertension   . Ischemic cardiomyopathy 10/02/2016   Refused ICD  . Joint pain   . Left bundle branch block   . Myocardial infarction ([place])   . Nausea   . Sleep apnea   . SOB (shortness of breath)   . Swelling    feet or legs    Past Surgical History:  Procedure Laterality Date  . APPENDECTOMY    . CARDIAC CATHETERIZATION N/A 03/17/2016   Procedure: Left Heart Cath and Coronary Angiography;  Surgeon: Sherren Mocha, MD;  Location: [place] [facility];  Service: Cardiovascular;  Laterality: N/A;  . CARDIAC CATHETERIZATION N/A 03/17/2016   Procedure: Coronary Stent Intervention;  Surgeon: Sherren Mocha, MD;  Location: [place] [facility];  Service: Cardiovascular;  Laterality: N/A;  . COLONOSCOPY WITH PROPOFOL N/A 08/13/2017   Procedure: COLONOSCOPY WITH PROPOFOL;  Surgeon: Jonathon Bellows, MD;  [facility] END[facility]COPY;  Service: Gastroenterology;  Laterality: N/A;  . SMALL BOWEL REPAIR    . TONSILLECTOMY    . UTERINE FIBROID SURGERY       Family History:  Family History  Problem Relation Age of Onset  . Anxiety disorder Mother   . Paranoid behavior Mother   . Hypertension Mother   . Heart failure Mother   . Stroke Mother   . Dementia Mother   . High Cholesterol Mother   . Diabetes Mother   . Hyperlipidemia Mother   . Heart disease Mother   . Depression Mother   . Hypertension Father   . High Cholesterol Father   . Mood Disorder Sister   . Stroke Sister   . Anxiety disorder Maternal Aunt   . Drug abuse Cousin   . Tuberculosis Paternal Grandfather     Social History:  reports that she quit smoking about 21 years ago. Her smoking use included cigarettes. She has never used smokeless tobacco. She reports that she drinks about 1.0 standard drinks of alcohol per week. She reports that she does not use drugs.  Additional Social History:  Alcohol / Drug Use Pain Medications: See MAR Prescriptions: See MAR Over the Counter: See MAR History of alcohol / drug use?: No history of alcohol / drug abuse Longest period of sobriety (when/how long): N/A  CIWA: CIWA-Ar BP: (!) 142/61 Pulse Rate: 92 COWS:    Allergies:  Allergies  Allergen Reactions  . Tetracycline Swelling  . Meperidine Nausea And Vomiting    Nausea    Home Medications:  (Not in a hospital admission)  OB/GYN Status:  No LMP recorded. Patient is postmenopausal.  General Assessment Data Location of Assessment: [facility] ED TTS Assessment: In system Is this a Tele or Face-to-Face Assessment?: Face-to-Face Is this an Initial Assessment or a Re-assessment for this encounter?: Initial Assessment Patient Accompanied by:: N/A Language Other than English: No Living Arrangements: Other (Comment)(Home) What gender do you identify as?: Female Marital status: Married(Married since 24) Maiden name: UKN Pregnancy Status: No Living Arrangements: Spouse/significant other Can pt return to current living arrangement?: Yes Admission Status: Voluntary Is  patient capable of signing voluntary admission?: Yes Referral Source: Psychiatrist Insurance type: Medicare  Medical Screening Exam ([place]) Medical Exam completed: Yes  Crisis Care Plan Living Arrangements: Spouse/significant other Legal Guardian: Other:(Self) Name of Psychiatrist: Dr. Eappen([facility]) Name of Therapist: None  Education Status Is patient currently in school?: No Is the patient employed, unemployed or receiving disability?: Unemployed(Retired)  Risk to self with the past 6 months Suicidal Ideation: Yes-Currently Present Has patient been a risk to self within the past 6 months prior to admission? : Yes Suicidal Intent: Yes-Currently Present Has patient had any suicidal intent within the past 6 months prior to admission? : Yes Is patient at risk for suicide?: Yes Suicidal Plan?: Yes-Currently Present Has patient had any suicidal plan within the past 6 months prior to admission? : Yes Specify Current Suicidal Plan:  To overdose on sleeping pills Access to Means: Yes Specify Access to Suicidal Means: Access to prescription medication What has been your use of drugs/alcohol within the last 12 months?: None Previous Attempts/Gestures: Yes How many times?: 4 Other Self Harm Risks: None Triggers for Past Attempts: Other (Comment)(Death of in-law) Intentional Self Injurious Behavior: None Family Suicide History: No Recent stressful life event(s): Other (Comment)(OCD; anxiety) Persecutory voices/beliefs?: No Depression: Yes Depression Symptoms: Tearfulness, Insomnia Substance abuse history and/or treatment for substance abuse?: No Suicide prevention information given to non-admitted patients: Not applicable  Risk to Others within the past 6 months Homicidal Ideation: No Does patient have any lifetime risk of violence toward others beyond the six months prior to admission? : No Thoughts of Harm to Others: No Current Homicidal Intent: No Current  Homicidal Plan: No Access to Homicidal Means: No Identified Victim: N/A History of harm to others?: No Assessment of Violence: None Noted Violent Behavior Description: N/A Does patient have access to weapons?: No Criminal Charges Pending?: No Does patient have a court date: No Is patient on probation?: No  Psychosis Hallucinations: None noted Delusions: None noted  Mental Status Report Appearance/Hygiene: In scrubs Eye Contact: Good Motor Activity: Freedom of movement, Unremarkable Speech: Logical/coherent Level of Consciousness: Alert Mood: Labile Affect: Labile Anxiety Level: Moderate Thought Processes: Coherent, Relevant Judgement: Impaired Orientation: Person, Place, Time, Situation, Appropriate for developmental age Obsessive Compulsive Thoughts/Behaviors: Severe  Cognitive Functioning Concentration: Fair Memory: Recent Intact, Remote Intact Is patient IDD: No Insight: Good Impulse Control: Fair Appetite: Fair Have you had any weight changes? : Loss Amount of the weight change? (lbs): (Intentional dieting) Sleep: Decreased Total Hours of Sleep: 6 Vegetative Symptoms: None  ADLScreening [facility] Assessment Services) Patient's cognitive ability adequate to safely complete daily activities?: Yes Patient able to express need for assistance with ADLs?: Yes Independently performs ADLs?: Yes (appropriate for developmental age)  Prior Inpatient Therapy Prior Inpatient Therapy: Yes Prior Therapy Dates: "67yo, 67yo, 1992, and 1994" Prior Therapy Facilty/Provider(s): Hospitals located in [place] Reason for Treatment: OCD, anxiety  Prior Outpatient Therapy Prior Outpatient Therapy: Yes Prior Therapy Dates: Current Prior Therapy Facilty/Provider(s): [facility] Reason for Treatment: OCD, anxiety Does patient have an ACCT team?: No Does patient have Intensive In-House Services?  : No Does patient have [facility] services? : No Does patient have P4CC services?:  No  ADL Screening (condition at time of admission) Patient's cognitive ability adequate to safely complete daily activities?: Yes Patient able to express need for assistance with ADLs?: Yes Independently performs ADLs?: Yes (appropriate for developmental age)       Abuse/Neglect Assessment (Assessment to be complete while patient is alone) Abuse/Neglect Assessment Can Be Completed: Yes Physical Abuse: Denies Verbal Abuse: Denies Sexual Abuse: Denies Exploitation of patient/patient's resources: Denies Self-Neglect: Denies Values / Beliefs Cultural Requests During Hospitalization: None Spiritual Requests During Hospitalization: None Consults Spiritual Care Consult Needed: No Social Work Consult Needed: No Regulatory affairs officer (For Healthcare) Does Patient Have a Medical Advance Directive?: No Would patient like information on creating a medical advance directive?: No - Patient declined       Child/Adolescent Assessment Running Away Risk: (Patient is an adult)  Disposition:  Disposition Initial Assessment Completed for this Encounter: Yes Disposition of Patient: (Pending Psych Consult)  On Site Evaluation by:   Reviewed with Physician:    Frederich Cha 08/27/2018 1:44 PM

## 2018-08-28 ENCOUNTER — Telehealth: Payer: Self-pay

## 2018-08-28 NOTE — Telephone Encounter (Signed)
Thanks Melanie Ray I am glad she is feeling better than her presentation yesterday.

## 2018-08-28 NOTE — Telephone Encounter (Signed)
spoke with husband who answered the phone and asked how patient was doing.  he states she doing better i ask him how patient slept he said good. also spoke with patient.  she states she didnt want to be admitted yesterday because she has a trip for a reunion she already paid a lot of money for and she didn't want to lose it.  Pt states that she still no feeling good in her house and that she spent $180 at [place] new clothes and towels because she threw hers away. I asked pt why she threw them away and she said because the door touch them... I told her why she didn't just have someone to remover her door and hang a curtain up until she feels comfortable having a door.  She said that was a good idea and she would see if her husband could do that.  Pt states that she feels her up coming trip will help her get better.    Told patient that I would call next week and check on her.

## 2018-09-08 ENCOUNTER — Other Ambulatory Visit: Payer: Self-pay | Admitting: Family Medicine

## 2018-09-08 ENCOUNTER — Telehealth: Payer: Self-pay

## 2018-09-08 DIAGNOSIS — F429 Obsessive-compulsive disorder, unspecified: Secondary | ICD-10-CM

## 2018-09-08 MED ORDER — LORAZEPAM 0.5 MG PO TABS: 0.5000 mg | ORAL_TABLET | Freq: Every day | ORAL | 2 refills | Status: DC | PRN

## 2018-09-08 NOTE — Telephone Encounter (Signed)
Last OV 02/07/2018   Last refilled 05/23/2018 disp 180 with no refills   Sent to PCP for approval

## 2018-09-08 NOTE — Telephone Encounter (Signed)
pt called spoke with lea at front desk states she needs a refill on ativan.

## 2018-09-08 NOTE — Telephone Encounter (Signed)
Sent ativan to her pharmacy .

## 2018-09-08 NOTE — Telephone Encounter (Signed)
pt called left message that she needed to speak with the doctor about some concerns about her medications.

## 2018-09-09 DIAGNOSIS — H268 Other specified cataract: Secondary | ICD-10-CM | POA: Diagnosis not present

## 2018-09-09 DIAGNOSIS — H21562 Pupillary abnormality, left eye: Secondary | ICD-10-CM | POA: Diagnosis not present

## 2018-09-09 DIAGNOSIS — H25812 Combined forms of age-related cataract, left eye: Secondary | ICD-10-CM | POA: Diagnosis not present

## 2018-09-09 NOTE — Telephone Encounter (Signed)
One month refill sent to pharmacy. Patient needs to set up a follow-up in the next 1-2 months to receive further refills. Please contact the patient to get her set up for an appointment. [place] for a 4:30 appointment.

## 2018-09-09 NOTE — Telephone Encounter (Signed)
Returned call to patient.  Left message to call back. 

## 2018-09-29 DIAGNOSIS — H401134 Primary open-angle glaucoma, bilateral, indeterminate stage: Secondary | ICD-10-CM | POA: Diagnosis not present

## 2018-09-29 DIAGNOSIS — H35372 Puckering of macula, left eye: Secondary | ICD-10-CM | POA: Diagnosis not present

## 2018-09-29 DIAGNOSIS — H43812 Vitreous degeneration, left eye: Secondary | ICD-10-CM | POA: Diagnosis not present

## 2018-09-29 DIAGNOSIS — H35371 Puckering of macula, right eye: Secondary | ICD-10-CM | POA: Diagnosis not present

## 2018-10-06 ENCOUNTER — Telehealth: Payer: Self-pay | Admitting: Physician Assistant

## 2018-10-06 ENCOUNTER — Other Ambulatory Visit: Payer: Medicare Other | Admitting: *Deleted

## 2018-10-06 DIAGNOSIS — I5022 Chronic systolic (congestive) heart failure: Secondary | ICD-10-CM

## 2018-10-06 LAB — LIPID PANEL
Chol/HDL Ratio: 3.4 ratio (ref 0.0–4.4)
Cholesterol, Total: 149 mg/dL (ref 100–199)
HDL: 44 mg/dL (ref >39)
LDL CALC: 85 mg/dL (ref 0–99)
Triglycerides: 101 mg/dL (ref 0–149)
VLDL CHOLESTEROL CAL: 20 mg/dL (ref 5–40)

## 2018-10-06 LAB — HEPATIC FUNCTION PANEL
ALT: 17 IU/L (ref 0–32)
AST: 16 IU/L (ref 0–40)
Albumin: 4.2 g/dL (ref 3.6–4.8)
Alkaline Phosphatase: 127 IU/L — ABNORMAL HIGH (ref 39–117)
BILIRUBIN TOTAL: 0.3 mg/dL (ref 0.0–1.2)
BILIRUBIN, DIRECT: 0.06 mg/dL (ref 0.00–0.40)
TOTAL PROTEIN: 7 g/dL (ref 6.0–8.5)

## 2018-10-06 NOTE — Telephone Encounter (Signed)
New messge    *STAT* If patient is at the pharmacy, call can be transferred to refill team.   1. Which medications need to be refilled? (please list name of each medication and dose if known)  carvedilol (COREG) 12.5 MG tablet Take 1 tablet (12.5 mg total) by mouth 2 (two) times daily with a meal.   atorvastatin (LIPITOR) 80 MG tablet Take 1 tablet (80 mg total) by mouth daily.     2. Which pharmacy/location (including street and city if local pharmacy) is medication to be sent to? [facility] [address] [place]   3. Do they need a 30 day or 90 day supply?  [place]

## 2018-10-06 NOTE — Telephone Encounter (Signed)
Called pt to inform her that she has refills for these medications at her pharmacy already and that she just need to call her pharmacy to request a refill and if she has any other problems, questions or concerns to call the office. Pt verbalized understanding.

## 2018-10-07 DIAGNOSIS — H35371 Puckering of macula, right eye: Secondary | ICD-10-CM | POA: Diagnosis not present

## 2018-10-10 ENCOUNTER — Other Ambulatory Visit: Payer: Self-pay | Admitting: Family Medicine

## 2018-10-10 DIAGNOSIS — I10 Essential (primary) hypertension: Secondary | ICD-10-CM

## 2018-10-10 DIAGNOSIS — I5020 Unspecified systolic (congestive) heart failure: Secondary | ICD-10-CM

## 2018-10-13 NOTE — Telephone Encounter (Signed)
Last OV 02/07/2018   Metformin last refilled 09/09/2018 disp 60 with no refills   Spironolactone last refilled 02/07/2018 disp 45 with 1 refill   Sent to PCP for approval

## 2018-10-15 DIAGNOSIS — H43812 Vitreous degeneration, left eye: Secondary | ICD-10-CM | POA: Diagnosis not present

## 2018-10-15 DIAGNOSIS — H401134 Primary open-angle glaucoma, bilateral, indeterminate stage: Secondary | ICD-10-CM | POA: Diagnosis not present

## 2018-10-15 DIAGNOSIS — Z23 Encounter for immunization: Secondary | ICD-10-CM | POA: Diagnosis not present

## 2018-10-15 DIAGNOSIS — H35042 Retinal micro-aneurysms, unspecified, left eye: Secondary | ICD-10-CM | POA: Diagnosis not present

## 2018-10-15 DIAGNOSIS — H43392 Other vitreous opacities, left eye: Secondary | ICD-10-CM | POA: Diagnosis not present

## 2018-10-15 DIAGNOSIS — H3562 Retinal hemorrhage, left eye: Secondary | ICD-10-CM | POA: Diagnosis not present

## 2018-10-16 ENCOUNTER — Telehealth: Payer: Self-pay

## 2018-10-16 DIAGNOSIS — I2510 Atherosclerotic heart disease of native coronary artery without angina pectoris: Secondary | ICD-10-CM

## 2018-10-16 NOTE — Telephone Encounter (Signed)
Reviewed results with patient who verbalized understanding.   Melanie Ray states she never picked up her Zetia so she has not been taking for months.  Instructed her to RESTART ZETIA 10 mg daily. She will work on diet and exercise. Repeat LFTs and lipids scheduled February 09, 2019. She was grateful for call and agrees with treatment plan.

## 2018-10-16 NOTE — Telephone Encounter (Signed)
-----   Message from Sherren Mocha, MD sent at 10/13/2018  1:06 PM EST ----- Labs reviewed in acceptable range. Pt treated with atorvastatin 80 mg and zetia 10 mg daily. Lipids not as good as last year. Wonder if she has been taking her medicine as directed? Would work on medication adherence, diet, exercise, and repeat labs in 3 months. If LDL remains > 80 mg/dL would refer to lipid clinic for PCSK9 Rx.

## 2018-10-16 NOTE — Telephone Encounter (Signed)
Please call the patient and get her set up for a follow-up visit.  She needs to be seen for further refills to be sent in.  Short-term refill sent to pharmacy.

## 2018-10-27 ENCOUNTER — Other Ambulatory Visit: Payer: Self-pay | Admitting: Family Medicine

## 2018-10-27 DIAGNOSIS — I5020 Unspecified systolic (congestive) heart failure: Secondary | ICD-10-CM

## 2018-10-27 DIAGNOSIS — I10 Essential (primary) hypertension: Secondary | ICD-10-CM

## 2018-11-26 ENCOUNTER — Other Ambulatory Visit: Payer: Self-pay | Admitting: Psychiatry

## 2018-11-26 DIAGNOSIS — F331 Major depressive disorder, recurrent, moderate: Secondary | ICD-10-CM

## 2018-11-28 ENCOUNTER — Telehealth: Payer: Self-pay | Admitting: Psychiatry

## 2018-11-28 ENCOUNTER — Other Ambulatory Visit: Payer: Self-pay | Admitting: Family Medicine

## 2018-11-28 DIAGNOSIS — F331 Major depressive disorder, recurrent, moderate: Secondary | ICD-10-CM

## 2018-11-28 MED ORDER — VENLAFAXINE HCL ER 150 MG PO CP24: 300.0000 mg | ORAL_CAPSULE | Freq: Every day | ORAL | 0 refills | Status: DC

## 2018-11-28 NOTE — Telephone Encounter (Signed)
  pt called left message with lea at front desk that she has an appt for  12-01-18 but she will be out of the effexor today.  can enough medication be sent in to get to appt.   venlafaxine XR (EFFEXOR-XR) 150 MG 24 hr capsule  Medication  Date: 08/27/2018 Department: [facility] EMERGENCY DEPARTMENT Ordering/Authorizing: Clapacs, Madie Reno, MD  Order Providers   Prescribing Provider Encounter Provider  Clapacs, Madie Reno, MD None  Outpatient Medication Detail    Disp Refills Start End   venlafaxine XR (EFFEXOR-XR) 150 MG 24 hr capsule 60 capsule 1 08/27/2018    Sig - Route: Take 2 capsules (300 mg total) by mouth daily. - Oral   Class: Print

## 2018-11-28 NOTE — Telephone Encounter (Signed)
Spoke to patient , discussed that based on [place]- she has one more refill of lorazepam. Sent Effexor to pharmacy. Educated patient not to take more than prescribed of her medications.

## 2018-11-28 NOTE — Telephone Encounter (Signed)
pt called left message that she needs a refill on  the effexor and on the ativan. pt took last one today

## 2018-12-01 ENCOUNTER — Other Ambulatory Visit: Payer: Self-pay

## 2018-12-01 ENCOUNTER — Ambulatory Visit (INDEPENDENT_AMBULATORY_CARE_PROVIDER_SITE_OTHER): Payer: Medicare Other | Admitting: Psychiatry

## 2018-12-01 ENCOUNTER — Encounter: Payer: Self-pay | Admitting: Psychiatry

## 2018-12-01 VITALS — BP 133/75 | HR 92 | Temp 97.5°F | Wt 180.0 lb

## 2018-12-01 DIAGNOSIS — Z9189 Other specified personal risk factors, not elsewhere classified: Secondary | ICD-10-CM | POA: Diagnosis not present

## 2018-12-01 DIAGNOSIS — F429 Obsessive-compulsive disorder, unspecified: Secondary | ICD-10-CM

## 2018-12-01 DIAGNOSIS — F331 Major depressive disorder, recurrent, moderate: Secondary | ICD-10-CM | POA: Diagnosis not present

## 2018-12-01 MED ORDER — ZOLPIDEM TARTRATE 5 MG PO TABS: 5.0000 mg | ORAL_TABLET | Freq: Every evening | ORAL | 0 refills | Status: DC | PRN

## 2018-12-01 MED ORDER — LORAZEPAM 0.5 MG PO TABS: 0.5000 mg | ORAL_TABLET | Freq: Two times a day (BID) | ORAL | 1 refills | Status: DC

## 2018-12-01 NOTE — Progress Notes (Signed)
[place] MD OP Progress Note  12/01/2018 5:31 PM Walker Paddack  MRN:  [id number]  Chief Complaint: ' I am here for follow up." Chief Complaint    Follow-up; Medication Refill     HPI: Melanie Ray is a 68 year old African-American female who presented to the clinic today for a follow-up visit.  Patient has a history of MDD, OCD.  Patient today report returns for a follow-up visit.  Patient was last seen on 08/27/2018.  At that time her OCD symptoms were very intense and she also had suicidal thoughts.  At that time she was sent to the emergency department here at [facility].  She was seen by Dr. Weber Cooks and was recommended to increase her Effexor dosage as well as Zyprexa 10 mg was added.  Patient did not follow-up as recommended.  Patient today returns reporting that the Effexor 300 mg helped her for a while.  She reports however that her symptoms came back again and she has been struggling since the past few weeks.  She reports she has been washing her clothes several times a day, washing her hands several times a day, showering several times a day and throwing away stuff since she feels that they are unclean or associated with death.  Patient reports her anxiety symptoms are so intense that sometimes it is hard to function.  She reports she feels frozen and cannot move at times due to the intensity of her anxiety symptoms.  She reports there has been days when she took Effexor more than what was prescribed to help with her anxiety symptoms.  Patient reports she is no longer on the olanzapine and does not remember her medication by that name prescribed to her.  Patient reports she is also on Ativan which helps to some extent.  Patient today denies any suicidality however reports there are times when she feels she is better off dead however denies any active plans.  She does have good social support system from her husband.  I have reviewed medical records and patient was prescribed olanzapine on  08/27/2018 by Dr. Weber Cooks for 30-day supply.  I have also reviewed her EKG dated December 04, 2017 and at that time her EKG was abnormal with QTC more than 500.  Patient reports she has a cardiologist that she follows up with.  Discussed with patient that she needs an EKG since she is on medications like Effexor, trazodone and Zyprexa can have an impact on her QT .  Patient agrees to reach out to her cardiologist.  We will provide her with an order for an EKG today.  Also discussed with her the importance of not taking more medication than was prescribed to her.  Discussed with her that she will benefit from psychotherapy sessions for her OCD symptoms since she continues to decompensate.  Patient although resistant initially agrees to do the same.  Will refer her to therapist here in clinic.      Visit Diagnosis:    ICD-10-CM   1. 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 Major depressive disorder, recurrent episode, moderate (HCC) F33.1   3. At risk for prolonged QT interval syndrome Z91.89 EKG 12-Lead    Past Psychiatric History: I have reviewed past psychiatric history from my progress note on 02/19/2018.  Patient had several suicide attempts by overdose in the past.  Patient used to follow-up with Dr.  Faheem in the past.   Past Medical History:  Past Medical History:  Diagnosis Date  . Anemia   . Anxiety   . Back pain   . CAD (coronary artery disease) 04/11/2016   S/p ant STEMI 5/17: LHC >> LAD proximal 80%, mid 80%, distal 50%, ostial D1 60%; LCx with LPDA lesion 30%; RCA Mild calcification with no significant stenosis in a medium caliber, nondominant RCA; LVEF is estimated at 45% with inferoapical and lateral wall akinesis >> PCI: PCI: 3.5 x 24 mm Promus DES to prox LAD, 2.5 x 12 mm Promus DES to mid LAD.   [name] Chest pain   . Chronic systolic CHF (congestive heart failure) ([place])  03/21/2016   Echo 01/30/17: Diff HK, mild focal basal septal hypertrophy, EF 30-35, mild AI, MAC, mild MR // Echo 06/08/16: Mild focal basal septal hypertrophy, EF 25-30%, diff HK, ant-septal AK, Gr 1 DD, mild AI, MAC, mild MR, PASP 37 mmHg // Echo 03/18/16: EF 30-35%, ant-septal AK, Gr 1 DD, mild MR, severe LAE.  [name] Constipation   . Depression   . Diabetes mellitus type 2 in obese ([place])   . Dizziness   . Glaucoma   . Gout   . Heart disease   . Heartburn   . History of acute anterior wall MI 03/17/2016  . History of heart attack   . Hyperlipemia   . Hypertension   . Ischemic cardiomyopathy 10/02/2016   Refused ICD  . Joint pain   . Left bundle branch block   . Myocardial infarction ([place])   . Nausea   . Sleep apnea   . SOB (shortness of breath)   . Swelling    feet or legs    Past Surgical History:  Procedure Laterality Date  . APPENDECTOMY    . CARDIAC CATHETERIZATION N/A 03/17/2016   Procedure: Left Heart Cath and Coronary Angiography;  Surgeon: Sherren Mocha, MD;  Location: [facility];  Service: Cardiovascular;  Laterality: N/A;  . CARDIAC CATHETERIZATION N/A 03/17/2016   Procedure: Coronary Stent Intervention;  Surgeon: Sherren Mocha, MD;  Location: [place];  Service: Cardiovascular;  Laterality: N/A;  . COLONOSCOPY WITH PROPOFOL N/A 08/13/2017   Procedure: COLONOSCOPY WITH PROPOFOL;  Surgeon: Jonathon Bellows, MD;  Location: [facility];  Service: Gastroenterology;  Laterality: N/A;  . SMALL BOWEL REPAIR    . TONSILLECTOMY    . UTERINE FIBROID SURGERY      Family Psychiatric History: Reviewed family psychiatric history from my progress note on 02/19/2018.  Family History:  Family History  Problem Relation Age of Onset  . Anxiety disorder Mother   . Paranoid behavior Mother   . Hypertension Mother   . Heart failure Mother   . Stroke Mother   . Dementia Mother   . High Cholesterol Mother   . Diabetes Mother   . Hyperlipidemia Mother   . Heart disease  Mother   . Depression Mother   . Hypertension Father   . High Cholesterol Father   . Mood Disorder Sister   . Stroke Sister   . Anxiety disorder Maternal Aunt   . Drug abuse Cousin   . Tuberculosis Paternal Grandfather     Social History: Reviewed social history from my progress note on 02/19/2018. Social History   Socioeconomic History  . Marital status: Married    Spouse name: Not on file  . Number of children: 0  . Years of education: BS in education  . Highest education level: Not on  file  Occupational History  . Occupation:  Retired Education officer, museum  Social Needs  . Financial resource strain: Not on file  . Food insecurity:    Worry: Not on file    Inability: Not on file  . Transportation needs:    Medical: Not on file    Non-medical: Not on file  Tobacco Use  . Smoking status: Former Smoker    Types: Cigarettes    Last attempt to quit: 04/13/1997    Years since quitting: 21.6  . Smokeless tobacco: Never Used  Substance and Sexual Activity  . Alcohol use: Yes    Alcohol/week: 1.0 standard drinks    Types: 1 Glasses of wine per week    Comment: 2-3 glasses of wine per month  . Drug use: No  . Sexual activity: Never  Lifestyle  . Physical activity:    Days per week: Not on file    Minutes per session: Not on file  . Stress: Not on file  Relationships  . Social connections:    Talks on phone: Not on file    Gets together: Not on file    Attends religious service: Not on file    Active member of club or organization: Not on file    Attends meetings of clubs or organizations: Not on file    Relationship status: Not on file  Other Topics Concern  . Not on file  Social History Narrative   Lives in [place] with spouse.  No children.   Retired first [employer] for over 30 years ([employer] for 10 years and then in [place] for over 20 years).   Left-handed    Allergies:  Allergies  Allergen Reactions  . Tetracycline Swelling  . Meperidine Nausea And  Vomiting    Nausea    Metabolic Disorder Labs: Lab Results  Component Value Date   HGBA1C 7.4 (H) 02/07/2018   MPG 131 03/18/2016   No results found for: PROLACTIN Lab Results  Component Value Date   CHOL 149 10/06/2018   TRIG 101 10/06/2018   HDL 44 10/06/2018   CHOLHDL 3.4 10/06/2018   VLDL 22.6 01/03/2017   LDLCALC 85 10/06/2018   LDLCALC 65 03/21/2017   Lab Results  Component Value Date   TSH 1.41 02/07/2018   TSH 0.934 06/03/2017    Therapeutic Level Labs: No results found for: LITHIUM No results found for: VALPROATE No components found for:  CBMZ  Current Medications: Current Outpatient Medications  Medication Sig Dispense Refill  . aspirin EC 81 MG EC tablet Take 1 tablet (81 mg total) by mouth daily.    [name] atorvastatin (LIPITOR) 80 MG tablet Take 1 tablet (80 mg total) by mouth daily. 90 tablet 3  . carvedilol (COREG) 12.5 MG tablet Take 1 tablet (12.5 mg total) by mouth 2 (two) times daily with a meal. 180 tablet 3  . dorzolamide-timolol (COSOPT) 22.3-6.8 MG/ML ophthalmic solution Place 1 drop into both eyes 2 (two) times daily.     [name] ezetimibe (ZETIA) 10 MG tablet Take 1 tablet (10 mg total) by mouth daily. 90 tablet 3  . indomethacin (INDOCIN) 50 MG capsule indomethacin 50 mg capsule  Take 1 capsule every 8 hours by oral route.    . isosorbide mononitrate (IMDUR) 30 MG 24 hr tablet Take 1 tablet (30 mg total) by mouth daily. 90 tablet 2  . LORazepam (ATIVAN) 0.5 MG tablet Take 1 tablet (0.5 mg total) by mouth 2 (two) times daily. 28 tablet 1  .  metFORMIN (GLUCOPHAGE) 500 MG tablet TAKE 1 TABLET BY MOUTH TWICE DAILY WITH MEALS 60 tablet 0  . moxifloxacin (VIGAMOX) 0.5 % ophthalmic solution INSTILL 1 DROP INTO LEFT EYE 4 TIMES DAILY (START TWO DAYS PRIOR TO SURGERY AND INSTILL TWO DROPS THE MORNING OF SURGERY  0  . nitroGLYCERIN (NITROSTAT) 0.4 MG SL tablet Place 1 tablet (0.4 mg total) under the tongue every 5 (five) minutes x 3 doses as needed for chest pain  (Do not exceed 3 doses). 25 tablet 2  . olmesartan (BENICAR) 20 MG tablet Take 1 tablet (20 mg total) by mouth daily. 30 tablet 11  . ondansetron (ZOFRAN) 4 MG tablet TAKE 1 TABLET BY MOUTH ONCE DAILY AS NEEDED FOR NAUSEA AND VOMITING 20 tablet 1  . prednisoLONE acetate (PRED FORTE) 1 % ophthalmic suspension INSTILL 1 DROP INTO LEFT EYE 4 TIMES DAILY  0  . PROLENSA 0.07 % SOLN INSTILL 1 DROP INTO LEFT EYE ONCE DAILY (BEGIN ONE DAY BEFORE SURGERY)  0  . spironolactone (ALDACTONE) 25 MG tablet TAKE 1/2 (ONE-HALF) TABLET BY MOUTH ONCE DAILY 45 tablet 0  . venlafaxine XR (EFFEXOR-XR) 150 MG 24 hr capsule Take 2 capsules (300 mg total) by mouth daily. 60 capsule 0  . Vitamin D, Ergocalciferol, (DRISDOL) 50000 units CAPS capsule Take 1 capsule (50,000 Units total) by mouth every 7 (seven) days. 8 capsule 0  . zolpidem (AMBIEN) 5 MG tablet Take 1 tablet (5 mg total) by mouth at bedtime as needed for sleep. 15 tablet 0  . furosemide (LASIX) 40 MG tablet Take 1 tablet (40 mg total) by mouth as directed. 40 mg on Mon, Wed and Fri's; all other days 20 mg 65 tablet 3   No current facility-administered medications for this visit.      Musculoskeletal: Strength & Muscle Tone: within normal limits Gait & Station: normal Patient leans: N/A  Psychiatric Specialty Exam: Review of Systems  Psychiatric/Behavioral: The patient is nervous/anxious and has insomnia.   All other systems reviewed and are negative.   Blood pressure 133/75, pulse 92, temperature (!) 97.5 F (36.4 C), temperature source Oral, weight 180 lb (81.6 kg).Body mass index is 31.89 kg/m.  General Appearance: Casual  Eye Contact:  pt wears sun glasses  Speech:  Normal Rate  Volume:  Normal  Mood:  Anxious  Affect:  Congruent  Thought Process:  Goal Directed and Descriptions of Associations: Intact  Orientation:  Full (Time, Place, and Person)  Thought Content: Logical   Suicidal Thoughts:  No  Homicidal Thoughts:  No  Memory:   Immediate;   Fair Recent;   Fair Remote;   Fair  Judgement:  Fair  Insight:  Fair  Psychomotor Activity:  Normal  Concentration:  Concentration: Fair and Attention Span: Fair  Recall:  [employer] of Knowledge: Fair  Language: Fair  Akathisia:  No  Handed:  Right  AIMS (if indicated): denies tremors, rigidity,stiffness  Assets:  Communication Skills Desire for Improvement Social Support  ADL's:  Intact  Cognition: WNL  Sleep:  Poor   Screenings: PHQ2-9     Office Visit from 06/03/2017 in [place] Office Visit from 01/03/2017 in [facility]  PHQ-2 Total Score  4  0  PHQ-9 Total Score  11  -       Assessment and Plan: Shakinah is a 68 year old African-American female who has a history of depression, OCD, OSA, presented to the clinic today for a follow-up visit.  Patient with noncompliance  to treatment, not keeping up with follow-up visits.  Patient returns today with worsening OCD symptoms.  Patient will benefit from medication changes as well as psychotherapy sessions.  Patient with comorbid cardiac problems does have a history of QT prolongation, given her taking medications more than what was prescribed, we will also refer her to her cardiologist to get an EKG of her heart and also for evaluation.  Plan For depression- unstable Continue Effexor extended release 300 mg p.o. daily We will refer her to a psychotherapist here in clinic.  Patient will benefit from intensive psychotherapy sessions.  However she declines IOP.  For OCD-unstable Effexor extended release 300 mg p.o. daily Start Ativan 0.5 mg p.o. twice daily for now given her QT prolongation history. Will get EKG -out of the same.  For insomnia-unstable Discontinue trazodone because of history of QT prolongation Start Ambien 5 mg p.o. nightly.  Refer her to her cardiologist-Dr. Sherren Mocha.  Patient with history of cardiac problems, likely taking more medication-SNRI -some  days per report, patient also on multiple medications that can have an impact on her QT, history of QT prolongation.  Discussed with patient about GeneSight testing given history of failing multiple trials of medication.  Follow-up in clinic in 2 weeks or sooner if needed.  Discussed with patient that if she is noncompliant with treatment recommendations she will be terminated from the clinic.  I have spent atleast 25 MINUTES  face to face with patient today. More than 50 % of the time was spent for psychoeducation and supportive psychotherapy and care coordination.  This note was generated in part or whole with voice recognition software. Voice recognition is usually quite accurate but there are transcription errors that can and very often do occur. I apologize for any typographical errors that were not detected and corrected.          Ursula Alert, MD 12/01/2018, 5:31 PM

## 2018-12-03 ENCOUNTER — Ambulatory Visit (INDEPENDENT_AMBULATORY_CARE_PROVIDER_SITE_OTHER): Payer: Medicare Other | Admitting: Family Medicine

## 2018-12-03 ENCOUNTER — Encounter: Payer: Self-pay | Admitting: Family Medicine

## 2018-12-03 VITALS — BP 138/84 | HR 86 | Temp 98.2°F | Ht 63.0 in | Wt 177.2 lb

## 2018-12-03 DIAGNOSIS — R42 Dizziness and giddiness: Secondary | ICD-10-CM

## 2018-12-03 DIAGNOSIS — I5022 Chronic systolic (congestive) heart failure: Secondary | ICD-10-CM | POA: Diagnosis not present

## 2018-12-03 DIAGNOSIS — R9431 Abnormal electrocardiogram [ECG] [EKG]: Secondary | ICD-10-CM | POA: Diagnosis not present

## 2018-12-03 DIAGNOSIS — E119 Type 2 diabetes mellitus without complications: Secondary | ICD-10-CM

## 2018-12-03 DIAGNOSIS — I10 Essential (primary) hypertension: Secondary | ICD-10-CM | POA: Diagnosis not present

## 2018-12-03 DIAGNOSIS — I447 Left bundle-branch block, unspecified: Secondary | ICD-10-CM | POA: Diagnosis not present

## 2018-12-03 HISTORY — DX: Abnormal electrocardiogram (ECG) (EKG): R94.31

## 2018-12-03 HISTORY — DX: Left bundle-branch block, unspecified: I44.7

## 2018-12-03 LAB — CBC
HCT: 36.1 % (ref 36.0–46.0)
Hemoglobin: 11.8 g/dL — ABNORMAL LOW (ref 12.0–15.0)
MCHC: 32.6 g/dL (ref 30.0–36.0)
MCV: 82 fl (ref 78.0–100.0)
Platelets: 334 10*3/uL (ref 150.0–400.0)
RBC: 4.4 Mil/uL (ref 3.87–5.11)
RDW: 14.8 % (ref 11.5–15.5)
WBC: 6.6 10*3/uL (ref 4.0–10.5)

## 2018-12-03 LAB — COMPREHENSIVE METABOLIC PANEL
ALBUMIN: 4.3 g/dL (ref 3.5–5.2)
ALK PHOS: 124 U/L — AB (ref 39–117)
ALT: 15 U/L (ref 0–35)
AST: 15 U/L (ref 0–37)
BILIRUBIN TOTAL: 0.5 mg/dL (ref 0.2–1.2)
BUN: 27 mg/dL — ABNORMAL HIGH (ref 6–23)
CO2: 28 mEq/L (ref 19–32)
Calcium: 10.2 mg/dL (ref 8.4–10.5)
Chloride: 104 mEq/L (ref 96–112)
Creatinine, Ser: 1.12 mg/dL (ref 0.40–1.20)
GFR: 58.43 mL/min — ABNORMAL LOW (ref >60.00)
Glucose, Bld: 125 mg/dL — ABNORMAL HIGH (ref 70–99)
Potassium: 4.1 mEq/L (ref 3.5–5.1)
Sodium: 139 mEq/L (ref 135–145)
Total Protein: 7.6 g/dL (ref 6.0–8.3)

## 2018-12-03 LAB — HEMOGLOBIN A1C: Hgb A1c MFr Bld: 7.4 % — ABNORMAL HIGH (ref 4.6–6.5)

## 2018-12-03 MED ORDER — PNEUMOCOCCAL 13-VAL CONJ VACC IM SUSP: 0.5000 mL | Freq: Once | INTRAMUSCULAR | 0 refills | Status: AC

## 2018-12-03 NOTE — Progress Notes (Signed)
Tommi Rumps, MD Phone: [phone]  Jinx Gilden is a 68 y.o. female who presents today for f/u.  CC: Prolonged QT, CHF, diabetes, lightheadedness  Patient was seen by her psychiatrist and noted to have a previously prolonged QT interval.  Her psychiatrist wanted her to have an EKG to determine appropriateness of medications.  The patient has had no palpitations, chest pain, shortness of breath, orthopnea, PND, or edema.  She is taking carvedilol, Lasix, Imdur, olmesartan, and spironolactone.  Diabetes: She is not checking sugars.  She is taking metformin.  No polyuria though does note some polydipsia.  No hypoglycemia.  Lightheadedness: She notes this is been going on intermittently for quite a while.  It has become less frequent.  Typically only occurs when she gets up from seated position.  She has had no syncope.  Social History   Tobacco Use  Smoking Status Former Smoker  . Types: Cigarettes  . Last attempt to quit: 04/13/1997  . Years since quitting: 21.6  Smokeless Tobacco Never Used     ROS see history of present illness  Objective  Physical Exam Vitals:   12/03/18 0942  BP: 138/84  Pulse: 86  Temp: 98.2 F (36.8 C)  SpO2: 96%   Laying blood pressure 160/88 pulse 83 Sitting blood pressure 150/90 pulse 90 Standing blood pressure 120/90 pulse 86  BP Readings from Last 3 Encounters:  12/03/18 138/84  12/01/18 133/75  08/27/18 (!) 167/81   Wt Readings from Last 3 Encounters:  12/03/18 177 lb 3.2 oz (80.4 kg)  12/01/18 180 lb (81.6 kg)  06/30/18 175 lb 6.4 oz (79.6 kg)    Physical Exam Constitutional:      General: She is not in acute distress.    Appearance: She is not diaphoretic.  Cardiovascular:     Rate and Rhythm: Normal rate and regular rhythm.     Heart sounds: Normal heart sounds.  Pulmonary:     Effort: Pulmonary effort is normal.     Breath sounds: Normal breath sounds.  Musculoskeletal:     Right lower leg: No edema.     Left  lower leg: No edema.  Skin:    General: Skin is warm and dry.  Neurological:     Mental Status: She is alert.    EKG: Normal sinus rhythm, left bundle branch block, rate 87, QTc 510  Assessment/Plan: Please see individual problem list.  Chronic systolic CHF (congestive heart failure) (HCC) Stable.  No reported symptoms.  Appears euvolemic.  She will continue her current regimen and follow-up with cardiology as planned.  DM type 2 (diabetes mellitus, type 2) (HCC) Continue metformin.  Check lab work.  Light headedness Symptoms most consistent with orthostasis.  Orthostatic vital signs obtained today.  She will rise slowly from a seated position.  Left bundle branch block Chronic.  She will follow with cardiology.  QT prolongation Chronic.  She will follow with cardiology.  We will send her EKG to her psychiatrist and have CMA contact the psychiatry office to inform them that the EKG was faxed and of the results.   Health Maintenance: Patient will get her Prevnar vaccine at the pharmacy.  Orders Placed This Encounter  Procedures  . HgB A1c  . Comp Met (CMET)  . CBC  . EKG 12-Lead    Meds ordered this encounter  Medications  . pneumococcal 13-valent conjugate vaccine (PREVNAR 13) SUSP injection    Sig: Inject 0.5 mLs into the muscle once for 1 dose.  Dispense:  0.5 mL    Refill:  0     Tommi Rumps, MD [place]

## 2018-12-03 NOTE — Assessment & Plan Note (Signed)
Stable.  No reported symptoms.  Appears euvolemic.  She will continue her current regimen and follow-up with cardiology as planned.

## 2018-12-03 NOTE — Assessment & Plan Note (Signed)
Symptoms most consistent with orthostasis.  Orthostatic vital signs obtained today.  She will rise slowly from a seated position.

## 2018-12-03 NOTE — Assessment & Plan Note (Signed)
Chronic.  She will follow with cardiology.  We will send her EKG to her psychiatrist and have CMA contact the psychiatry office to inform them that the EKG was faxed and of the results.

## 2018-12-03 NOTE — Patient Instructions (Signed)
Nice to see you. We will get lab work today and contact you with the results. Please follow-up with cardiology. We will fax your EKG to your psychiatrist. Please rise slowly from seated positions.  Please try to stay adequately hydrated.

## 2018-12-03 NOTE — Assessment & Plan Note (Signed)
Chronic.  She will follow with cardiology.

## 2018-12-03 NOTE — Assessment & Plan Note (Signed)
Continue metformin.  Check lab work. 

## 2018-12-04 ENCOUNTER — Other Ambulatory Visit: Payer: Self-pay | Admitting: Family Medicine

## 2018-12-04 DIAGNOSIS — D649 Anemia, unspecified: Secondary | ICD-10-CM

## 2018-12-04 DIAGNOSIS — R748 Abnormal levels of other serum enzymes: Secondary | ICD-10-CM

## 2018-12-05 ENCOUNTER — Telehealth: Payer: Self-pay

## 2018-12-05 NOTE — Telephone Encounter (Signed)
Melanie Mocha, MD  Melanie Alert, MD  Cc: Theodoro Parma, RN        Ok thanks. Valetta Fuller can you get her in for an APP visit?   Previous Messages    ----- Message -----  From: Melanie Alert, MD  Sent: 12/01/2018  5:33 PM EST  To: Melanie Mocha, MD  Subject: Inpatient Notes                  Hello Dr.Cooper,saw her in office today.  Have advised her to follow up with you given her hx of QT prolongation and being on multiple psychotropics which can affect that. She is also likely taking more effexor atleast some days.  She will need an EKG as well as cardiac clearance .  Thanks .         Spoke with Ms. Record and she states Dr. Caryl Bis sent needed information to Dr. Shea Evans (she was evaluated 1/29). She declined an appointment next week. Scheduled her 2/25 with Richardson Dopp. She understands to call if Dr. Shea Evans needs more assistance prior to that time.

## 2018-12-08 NOTE — Telephone Encounter (Signed)
Hi Curt Bears thank you for reaching out to me . Could you please ask Dr.Cooper to take a look at her recent EKG. She has a history of prolonged QT - however she is on multiple medications which can cause it too. She is at risk of decompensating with regards to her mental health issues if I take her off of meds especially effexor . She was also on zyprexa ( which can prolong it ) recently , but I did not give it to her at the last appointment. It was given during an ED visit few months ago.However , she may need more medication readjustment since she is not doing too well , however majority of medications indicated for her severe OCD symptoms can also have an impact on her QT. So I need a cardiology clearance . Please advice.

## 2018-12-09 NOTE — Telephone Encounter (Signed)
I reviewed the patient's EKG and compared it to her previous studies over the last few years.  Her QT interval and corrected QT interval is stable.  Her QT is prolonged primarily because of a left bundle branch block pattern with most of the conduction delay being intraventricular.  I think she can continue on her current therapy without changes in medications. thanks

## 2018-12-09 NOTE — Telephone Encounter (Signed)
Thanks Dr.Cooper . That helps a lot. Appreciate it.

## 2018-12-15 ENCOUNTER — Ambulatory Visit: Payer: Medicare Other | Admitting: Psychiatry

## 2018-12-23 ENCOUNTER — Other Ambulatory Visit (INDEPENDENT_AMBULATORY_CARE_PROVIDER_SITE_OTHER): Payer: Medicare Other

## 2018-12-23 ENCOUNTER — Ambulatory Visit: Payer: Medicare Other | Admitting: Licensed Clinical Social Worker

## 2018-12-23 DIAGNOSIS — D649 Anemia, unspecified: Secondary | ICD-10-CM | POA: Diagnosis not present

## 2018-12-23 DIAGNOSIS — R748 Abnormal levels of other serum enzymes: Secondary | ICD-10-CM

## 2018-12-23 LAB — CBC
HCT: 35.1 % — ABNORMAL LOW (ref 36.0–46.0)
Hemoglobin: 11.5 g/dL — ABNORMAL LOW (ref 12.0–15.0)
MCHC: 32.7 g/dL (ref 30.0–36.0)
MCV: 82.7 fl (ref 78.0–100.0)
PLATELETS: 308 10*3/uL (ref 150.0–400.0)
RBC: 4.24 Mil/uL (ref 3.87–5.11)
RDW: 14.8 % (ref 11.5–15.5)
WBC: 6.2 10*3/uL (ref 4.0–10.5)

## 2018-12-23 LAB — HEPATIC FUNCTION PANEL
ALT: 15 U/L (ref 0–35)
AST: 18 U/L (ref 0–37)
Albumin: 4.1 g/dL (ref 3.5–5.2)
Alkaline Phosphatase: 109 U/L (ref 39–117)
BILIRUBIN DIRECT: 0.1 mg/dL (ref 0.0–0.3)
Total Bilirubin: 0.3 mg/dL (ref 0.2–1.2)
Total Protein: 7.1 g/dL (ref 6.0–8.3)

## 2018-12-23 LAB — GAMMA GT: GGT: 31 U/L (ref 7–51)

## 2018-12-25 ENCOUNTER — Other Ambulatory Visit: Payer: Self-pay | Admitting: Family Medicine

## 2018-12-25 DIAGNOSIS — R748 Abnormal levels of other serum enzymes: Secondary | ICD-10-CM

## 2018-12-25 DIAGNOSIS — D649 Anemia, unspecified: Secondary | ICD-10-CM

## 2018-12-29 ENCOUNTER — Other Ambulatory Visit: Payer: Self-pay | Admitting: Psychiatry

## 2018-12-29 DIAGNOSIS — F331 Major depressive disorder, recurrent, moderate: Secondary | ICD-10-CM

## 2018-12-29 MED ORDER — TRAZODONE HCL 100 MG PO TABS: 100.0000 mg | ORAL_TABLET | Freq: Every day | ORAL | 0 refills | Status: DC

## 2018-12-29 NOTE — Telephone Encounter (Signed)
SENT TRAZODONE AND EFFEXOR TO PHARMACY. Patient needs appointment

## 2018-12-29 NOTE — Telephone Encounter (Signed)
received a fax request for refill on trazodone 100mg 

## 2018-12-29 NOTE — Telephone Encounter (Signed)
pt called left message that she needs a refill on her effexor she is completely out and when she doesnt take it every day she get a bad headache.

## 2018-12-30 ENCOUNTER — Ambulatory Visit (INDEPENDENT_AMBULATORY_CARE_PROVIDER_SITE_OTHER): Payer: Medicare Other | Admitting: Physician Assistant

## 2018-12-30 ENCOUNTER — Encounter: Payer: Self-pay | Admitting: Physician Assistant

## 2018-12-30 VITALS — BP 136/68 | HR 78 | Ht 63.0 in | Wt 176.1 lb

## 2018-12-30 DIAGNOSIS — I10 Essential (primary) hypertension: Secondary | ICD-10-CM

## 2018-12-30 DIAGNOSIS — E7800 Pure hypercholesterolemia, unspecified: Secondary | ICD-10-CM

## 2018-12-30 DIAGNOSIS — F422 Mixed obsessional thoughts and acts: Secondary | ICD-10-CM | POA: Diagnosis not present

## 2018-12-30 DIAGNOSIS — E119 Type 2 diabetes mellitus without complications: Secondary | ICD-10-CM

## 2018-12-30 DIAGNOSIS — I5022 Chronic systolic (congestive) heart failure: Secondary | ICD-10-CM | POA: Diagnosis not present

## 2018-12-30 DIAGNOSIS — I2510 Atherosclerotic heart disease of native coronary artery without angina pectoris: Secondary | ICD-10-CM | POA: Diagnosis not present

## 2018-12-30 MED ORDER — NITROGLYCERIN 0.4 MG SL SUBL: 0.4000 mg | SUBLINGUAL_TABLET | SUBLINGUAL | 11 refills | Status: DC | PRN

## 2018-12-30 MED ORDER — SPIRONOLACTONE 25 MG PO TABS: ORAL_TABLET | ORAL | 3 refills | Status: DC

## 2018-12-30 MED ORDER — CARVEDILOL 12.5 MG PO TABS: 12.5000 mg | ORAL_TABLET | Freq: Two times a day (BID) | ORAL | 3 refills | Status: DC

## 2018-12-30 MED ORDER — FUROSEMIDE 40 MG PO TABS: 40.0000 mg | ORAL_TABLET | ORAL | 3 refills | Status: DC

## 2018-12-30 MED ORDER — ISOSORBIDE MONONITRATE ER 30 MG PO TB24: 30.0000 mg | ORAL_TABLET | Freq: Every day | ORAL | 3 refills | Status: DC

## 2018-12-30 MED ORDER — OLMESARTAN MEDOXOMIL 20 MG PO TABS: 20.0000 mg | ORAL_TABLET | Freq: Every day | ORAL | 3 refills | Status: DC

## 2018-12-30 MED ORDER — ATORVASTATIN CALCIUM 80 MG PO TABS: 80.0000 mg | ORAL_TABLET | Freq: Every day | ORAL | 3 refills | Status: DC

## 2018-12-30 MED ORDER — EZETIMIBE 10 MG PO TABS: 10.0000 mg | ORAL_TABLET | Freq: Every day | ORAL | 3 refills | Status: DC

## 2018-12-30 NOTE — Progress Notes (Signed)
Cardiology Office Note:    Date:  12/30/2018   ID:  Melanie Ray, DOB 04-26-1951, MRN [id number]  PCP:  Leone Haven, MD  Cardiologist:  Sherren Mocha, MD  Electrophysiologist:  None   Referring MD: Leone Haven, MD   Chief Complaint  Patient presents with  . Follow-up    CAD, CHF    History of Present Illness:    Melanie Ray is a 68 y.o. female with CAD s/p anterior STEMI in 5/17 tx with DES x 2 to LAD. EF was 30-35% by Echo and she was DC on LifeVest. However, she was no longer interested in wearing it at FU. Echo in 8/17 demonstrated an EF 25-30%. She was set up to see Dr. Rodell Perna EP evaluation. BiV-ICD implantation was recommended but the patient declined.  She could not tolerate Entresto and therefore has remained on angiotensin receptor blocker.  FU Echo in 3/18 demonstrated EF 30-35.  She was last seen by Dr. Burt Knack in 06/2018.  A FU Echo at that time continued to demonstrate an EF of 30-35.     Melanie Ray returns for follow-up.  She has not had any chest discomfort, significant shortness of breath, orthopnea, paroxysmal nocturnal dyspnea, lower extremity swelling or syncope.  She is followed by psychiatry.  She has had worsening symptoms of obsessive-compulsive disorder.  There have been some concerns over QT prolongation limiting medical therapy for OCD.  I have reviewed her notes today.  It appears that she was on olanzapine at one point in October 2019.  Prior CV studies:   The following studies were reviewed today:   Echo 06/30/18 Mild conc LVH, EF 30-35, ant-sept/inf/inf-sept HK, Gr 1 DD, mild to mod AI, MAC, mobile density on MV unchanged, mild MR, mild TR, PASP 39  Echo 01/30/17 Diff HK, mild focal basal septal hypertrophy, EF 30-35, mild AI, MAC, mild MR  Echo 06/08/16 Mild focal basal septal hypertrophy, EF 25-30%, diff HK, ant-septal AK, Gr 1 DD, mild AI, MAC, mild MR, PASP 37 mmHg  Echo 03/18/16 EF 30-35%, ant-septal AK, Gr  1 DD, mild MR, severe LAE.  LHC 03/17/16 LAD proximal 80%, mid 80%, distal 50%, ostial D1 60%  LCx with LPDA lesion, 30%  RCA Mild calcification with no significant stenosis in a medium caliber, nondominant RCA LVEF is estimated at 45% with inferoapical and lateral wall akinesis PCI: PCI: 3.5 x 24 mm Promus DES to prox LAD, 2.5 x 12 mm Promus DES to mid LAD.   Past Medical History:  Diagnosis Date  . Anemia   . Anxiety   . Back pain   . CAD (coronary artery disease) 04/11/2016   S/p ant STEMI 5/17: LHC >> LAD proximal 80%, mid 80%, distal 50%, ostial D1 60%; LCx with LPDA lesion 30%; RCA Mild calcification with no significant stenosis in a medium caliber, nondominant RCA; LVEF is estimated at 45% with inferoapical and lateral wall akinesis >> PCI: PCI: 3.5 x 24 mm Promus DES to prox LAD, 2.5 x 12 mm Promus DES to mid LAD.   [name] Chest pain   . Chronic systolic CHF (congestive heart failure) ([place]) 03/21/2016   Echo 01/30/17: Diff HK, mild focal basal septal hypertrophy, EF 30-35, mild AI, MAC, mild MR // Echo 06/08/16: Mild focal basal septal hypertrophy, EF 25-30%, diff HK, ant-septal AK, Gr 1 DD, mild AI, MAC, mild MR, PASP 37 mmHg // Echo 03/18/16: EF 30-35%, ant-septal AK, Gr 1 DD, mild MR, severe LAE.  [name] Constipation   .  Depression   . Diabetes mellitus type 2 in obese ([place])   . Dizziness   . Glaucoma   . Gout   . Heart disease   . Heartburn   . History of acute anterior wall MI 03/17/2016  . History of heart attack   . Hyperlipemia   . Hypertension   . Ischemic cardiomyopathy 10/02/2016   Refused ICD  . Joint pain   . Left bundle branch block   . Myocardial infarction ([place])   . Nausea   . Sleep apnea   . SOB (shortness of breath)   . Swelling    feet or legs   Surgical Hx: The patient  has a past surgical history that includes Small bowel repair; Appendectomy; Tonsillectomy; Cardiac catheterization (N/A, 03/17/2016); Cardiac catheterization (N/A, 03/17/2016); Uterine fibroid  surgery; and Colonoscopy with propofol (N/A, 08/13/2017).   Current Medications: Current Meds  Medication Sig  . aspirin EC 81 MG EC tablet Take 1 tablet (81 mg total) by mouth daily.  [name] atorvastatin (LIPITOR) 80 MG tablet Take 1 tablet (80 mg total) by mouth daily.  . carvedilol (COREG) 12.5 MG tablet Take 1 tablet (12.5 mg total) by mouth 2 (two) times daily with a meal.  . dorzolamide-timolol (COSOPT) 22.3-6.8 MG/ML ophthalmic solution Place 1 drop into both eyes 2 (two) times daily.   [name] ezetimibe (ZETIA) 10 MG tablet Take 1 tablet (10 mg total) by mouth daily.  [name] LORazepam (ATIVAN) 0.5 MG tablet Take 1 tablet (0.5 mg total) by mouth 2 (two) times daily.  . metFORMIN (GLUCOPHAGE) 500 MG tablet TAKE 1 TABLET BY MOUTH TWICE DAILY WITH MEALS  . nitroGLYCERIN (NITROSTAT) 0.4 MG SL tablet Place 1 tablet (0.4 mg total) under the tongue every 5 (five) minutes x 3 doses as needed for chest pain (Do not exceed 3 doses).  . olmesartan (BENICAR) 20 MG tablet Take 1 tablet (20 mg total) by mouth daily.  . ondansetron (ZOFRAN) 4 MG tablet TAKE 1 TABLET BY MOUTH ONCE DAILY AS NEEDED FOR NAUSEA AND VOMITING  . spironolactone (ALDACTONE) 25 MG tablet TAKE 1/2 (ONE-HALF) TABLET BY MOUTH ONCE DAILY  . traZODone (DESYREL) 100 MG tablet Take 1 tablet (100 mg total) by mouth at bedtime.  [name] venlafaxine XR (EFFEXOR-XR) 150 MG 24 hr capsule TAKE 2 CAPSULES BY MOUTH ONCE DAILY  . zolpidem (AMBIEN) 5 MG tablet Take 1 tablet (5 mg total) by mouth at bedtime as needed for sleep.  . [DISCONTINUED] spironolactone (ALDACTONE) 25 MG tablet TAKE 1/2 (ONE-HALF) TABLET BY MOUTH ONCE DAILY     Allergies:   Tetracycline and Meperidine   Social History   Tobacco Use  . Smoking status: Former Smoker    Types: Cigarettes    Last attempt to quit: 04/13/1997    Years since quitting: 21.7  . Smokeless tobacco: Never Used  Substance Use Topics  . Alcohol use: Yes    Alcohol/week: 1.0 standard drinks    Types: 1 Glasses of  wine per week    Comment: 2-3 glasses of wine per month  . Drug use: No     Family Hx: The patient's family history includes Anxiety disorder in her maternal aunt and mother; Dementia in her mother; Depression in her mother; Diabetes in her mother; Drug abuse in her cousin; Heart disease in her mother; Heart failure in her mother; High Cholesterol in her father and mother; Hyperlipidemia in her mother; Hypertension in her father and mother; Mood Disorder in her sister; Paranoid behavior in her  mother; Stroke in her mother and sister; Tuberculosis in her paternal grandfather.  ROS:   Please see the history of present illness.    Review of Systems  Eyes: Positive for visual disturbance.  Respiratory: Positive for snoring.   Gastrointestinal: Positive for nausea and vomiting.  Psychiatric/Behavioral: Positive for depression. The patient is nervous/anxious.    All other systems reviewed and are negative.   EKGs/Labs/Other Test Reviewed:    EKG:  EKG is not ordered today.  The ekg performed on 12/03/2018 was personally reviewed.  It demonstrates sinus rhythm, heart rate 87, left bundle branch block.  QTc 510 ms.  Recent Labs: 02/07/2018: TSH 1.41 12/03/2018: BUN 27; Creatinine, Ser 1.12; Potassium 4.1; Sodium 139 12/23/2018: ALT 15; Hemoglobin 11.5; Platelets 308.0   Recent Lipid Panel Lab Results  Component Value Date/Time   CHOL 149 10/06/2018 08:32 AM   TRIG 101 10/06/2018 08:32 AM   HDL 44 10/06/2018 08:32 AM   CHOLHDL 3.4 10/06/2018 08:32 AM   CHOLHDL 4 01/03/2017 09:47 AM   LDLCALC 85 10/06/2018 08:32 AM   LDLDIRECT 78.0 02/07/2018 09:28 AM    Physical Exam:    VS:  BP 136/68   Pulse 78   Ht 5' 3"  (1.6 m)   Wt 176 lb 1.9 oz (79.9 kg)   SpO2 97%   BMI 31.20 kg/m     Wt Readings from Last 3 Encounters:  12/30/18 176 lb 1.9 oz (79.9 kg)  12/03/18 177 lb 3.2 oz (80.4 kg)  12/01/18 180 lb (81.6 kg)     Physical Exam  Constitutional: She is oriented to person, place, and  time. She appears well-developed and well-nourished. No distress.  HENT:  Head: Normocephalic and atraumatic.  Eyes: No scleral icterus.  Neck: Neck supple. No JVD present. No thyromegaly present.  Cardiovascular: Normal rate, regular rhythm, S1 normal, S2 normal and normal heart sounds.  No murmur heard. Pulmonary/Chest: Effort normal. She has no rales.  Abdominal: Soft. There is no hepatomegaly.  Musculoskeletal:        General: No edema.  Lymphadenopathy:    She has no cervical adenopathy.  Neurological: She is alert and oriented to person, place, and time.  Skin: Skin is warm and dry.  Psychiatric:  Tearful at times    ASSESSMENT & PLAN:    Chronic systolic CHF (congestive heart failure) (HCC) EF 30-35 by most recent echocardiogram.  NYHA 2.  Volume is stable.  Continue current dose of beta-blocker, ARB, spironolactone, nitrates.  She has declined referral to EP for ICD implantation.  Coronary artery disease involving native coronary artery of native heart without angina pectoris History of anterior elevation myocardial infarction in May 2017 treated with DES to the LAD x2.  She is doing well without anginal symptoms.  Continue aspirin, atorvastatin, carvedilol, isosorbide, ezetimibe.  Essential hypertension The patient's blood pressure is controlled on her current regimen.  Continue current therapy.   Pure hypercholesterolemia LDL optimal on most recent lab work.  Continue current Rx.    Type 2 diabetes mellitus without complication, without long-term current use of insulin ([place]) Managed by primary care.  Empagliflozin could be considered given the results of the EMPA-REG OUTCOME trial.   Mixed obsessional thoughts and acts I reviewed her ECG today with Dr. Caryl Comes (electrophysiologist).  When her QT interval is corrected for her left bundle branch block, her QTC is 452 ms.  Therefore, it would be acceptable for her to take 1 of the second-generation antipsychotics.   Olanzapine should be  well tolerated.  I would recommend that she have a follow-up ECG 1 to 2 weeks after starting any medication that can induce QT prolongation.  Our office can certainly arrange that if needed.   Dispo:  Return in about 6 months (around 06/30/2019) for Routine Follow Up, w/ Dr. Burt Knack, or Richardson Dopp, PA-C.   Medication Adjustments/Labs and Tests Ordered: Current medicines are reviewed at length with the patient today.  Concerns regarding medicines are outlined above.  Tests Ordered: No orders of the defined types were placed in this encounter.  Medication Changes: Meds ordered this encounter  Medications  . atorvastatin (LIPITOR) 80 MG tablet    Sig: Take 1 tablet (80 mg total) by mouth daily.    Dispense:  90 tablet    Refill:  3    Order Specific Question:   Supervising Provider    Answer:   Belva Crome [phone]  . carvedilol (COREG) 12.5 MG tablet    Sig: Take 1 tablet (12.5 mg total) by mouth 2 (two) times daily with a meal.    Dispense:  180 tablet    Refill:  3    Order Specific Question:   Supervising Provider    Answer:   Belva Crome [phone]  . ezetimibe (ZETIA) 10 MG tablet    Sig: Take 1 tablet (10 mg total) by mouth daily.    Dispense:  90 tablet    Refill:  3    Order Specific Question:   Supervising Provider    Answer:   Belva Crome [phone]  . furosemide (LASIX) 40 MG tablet    Sig: Take 1 tablet (40 mg total) by mouth as directed. 40 mg on Mon, Wed and Fri's; all other days 20 mg    Dispense:  65 tablet    Refill:  3    This is a 90 day supply    Order Specific Question:   Supervising Provider    Answer:   Belva Crome [[id number]]  . isosorbide mononitrate (IMDUR) 30 MG 24 hr tablet    Sig: Take 1 tablet (30 mg total) by mouth daily.    Dispense:  90 tablet    Refill:  3    Order Specific Question:   Supervising Provider    Answer:   Belva Crome [phone]  . nitroGLYCERIN (NITROSTAT) 0.4 MG SL tablet    Sig: Place 1 tablet (0.4 mg total)  under the tongue every 5 (five) minutes x 3 doses as needed for chest pain (Do not exceed 3 doses).    Dispense:  25 tablet    Refill:  11    Order Specific Question:   Supervising Provider    Answer:   Belva Crome [phone]  . olmesartan (BENICAR) 20 MG tablet    Sig: Take 1 tablet (20 mg total) by mouth daily.    Dispense:  90 tablet    Refill:  3    Order Specific Question:   Supervising Provider    Answer:   Belva Crome [phone]  . spironolactone (ALDACTONE) 25 MG tablet    Sig: TAKE 1/2 (ONE-HALF) TABLET BY MOUTH ONCE DAILY    Dispense:  45 tablet    Refill:  3    Order Specific Question:   Supervising Provider    Answer:   Belva Crome [[id number]]    Signed, Richardson Dopp, PA-C  12/30/2018 3:52 PM    [facility] [place],  [place]  [address] Phone: [phone]; Fax: [phone]

## 2018-12-30 NOTE — Patient Instructions (Addendum)
Medication Instructions:  Your physician recommends that you continue on your current medications as directed. Please refer to the Current Medication list given to you today.  If you need a refill on your cardiac medications before your next appointment, please call your pharmacy.   Lab work: NONE If you have labs (blood work) drawn today and your tests are completely normal, you will receive your results only by: . MyChart Message (if you have MyChart) OR . A paper copy in the mail If you have any lab test that is abnormal or we need to change your treatment, we will call you to review the results.  Testing/Procedures: NONE  Follow-Up: At [facility], you and your health needs are our priority.  As part of our continuing mission to provide you with exceptional heart care, we have created designated Provider Care Teams.  These Care Teams include your primary Cardiologist (physician) and Advanced Practice Providers (APPs -  Physician Assistants and Nurse Practitioners) who all work together to provide you with the care you need, when you need it. You will need a follow up appointment in:  6 months.  Please call our office 2 months in advance to schedule this appointment.  You may see Michael Cooper, MD or one of the following Advanced Practice Providers on your designated Care Team: Scott Weaver, PA-C Vin Bhagat, PA-C . Janine Hammond, NP  Any Other Special Instructions Will Be Listed Below (If Applicable).    

## 2019-01-05 ENCOUNTER — Ambulatory Visit (INDEPENDENT_AMBULATORY_CARE_PROVIDER_SITE_OTHER): Payer: Medicare Other | Admitting: Psychiatry

## 2019-01-05 ENCOUNTER — Other Ambulatory Visit: Payer: Self-pay

## 2019-01-05 ENCOUNTER — Encounter: Payer: Self-pay | Admitting: Psychiatry

## 2019-01-05 VITALS — BP 168/86 | HR 92 | Temp 97.6°F | Wt 178.4 lb

## 2019-01-05 DIAGNOSIS — I2510 Atherosclerotic heart disease of native coronary artery without angina pectoris: Secondary | ICD-10-CM

## 2019-01-05 DIAGNOSIS — Z9189 Other specified personal risk factors, not elsewhere classified: Secondary | ICD-10-CM | POA: Diagnosis not present

## 2019-01-05 DIAGNOSIS — F331 Major depressive disorder, recurrent, moderate: Secondary | ICD-10-CM | POA: Diagnosis not present

## 2019-01-05 DIAGNOSIS — F429 Obsessive-compulsive disorder, unspecified: Secondary | ICD-10-CM

## 2019-01-05 MED ORDER — OLANZAPINE 5 MG PO TABS: 5.0000 mg | ORAL_TABLET | Freq: Every day | ORAL | 1 refills | Status: DC

## 2019-01-05 NOTE — Progress Notes (Signed)
[facility] MD OP Progress Note  01/05/2019 5:50 PM Melanie Ray  MRN:  [id number]  Chief Complaint: ' I am here for follow up." Chief Complaint    Follow-up; Anxiety; Panic Attack     HPI: Melanie Ray is a 68 year old African-American female who presented to clinic today for a follow-up visit.  Patient is married.  She lives with her husband in [place].  Patient has a history of MDD and OCD.  Patient was last seen on 12/01/2018.  Patient at that time was advised to return to clinic in 2 weeks.  Patient however has a history of noncompliance with treatment recommendations.  Patient called the clinic on Friday asking for a change of medication and was advised to be seen in clinic as soon as possible.  Hence patient returns for a follow-up visit today.  Patient today reports she stopped all her medication 2 days ago since she did not know why she was taking them anymore.  Patient continues to struggle with significant OCD symptoms.  She reports she has been washing her clothes several times a day, she has been throwing out several of her gowns since they touched a certain door in her house which she associates with death.  Patient reports she has been scrubbing all her rooms as well as washing her bathroom several times since she continues to be extremely anxious.  Patient became very tearful when she discussed her symptoms.  Discussed with patient that the reason she was advised to be seen in clinic to be restarted back on her olanzapine.  Olanzapine was helpful with her OCD symptoms previously.  Patient however with history of QT prolongation asked to be evaluated by her cardiologist.  Writer spoke to patient's cardiologist Dr. Burt Knack- and per Dr. Burt Knack patient is cleared to be restarted on medications like Zyprexa or Effexor.'  Patient agrees with restarting Zyprexa today.  Discussed with patient to restart psychotherapy sessions on a frequent basis.  Patient although initially resistant agrees to the  same.  Discussed referral for [facility].  Patient agrees with the same.  Patient does report her recurrent death wish which is chronic for her.  Patient however reports she does not have any active plans and agrees to ask for help.     Visit Diagnosis:    ICD-10-CM   1. Obsessive-compulsive disorder with good or fair insight F42.9 OLANZapine (ZYPREXA) 5 MG tablet  2. Major depressive disorder, recurrent episode, moderate (HCC) F33.1   3. At risk for prolonged QT interval syndrome Z91.89     Past Psychiatric History: I have reviewed past psychiatric history from my progress note on 02/19/2018.  Patient had several suicide attempts by overdose in the past.  Patient used to follow-up with Dr. Gretel Acre.  Past Medical History:  Past Medical History:  Diagnosis Date  . Anemia   . Anxiety   . Back pain   . CAD (coronary artery disease) 04/11/2016   S/p ant STEMI 5/17: LHC >> LAD proximal 80%, mid 80%, distal 50%, ostial D1 60%; LCx with LPDA lesion 30%; RCA Mild calcification with no significant stenosis in a medium caliber, nondominant RCA; LVEF is estimated at 45% with inferoapical and lateral wall akinesis >> PCI: PCI: 3.5 x 24 mm Promus DES to prox LAD, 2.5 x 12 mm Promus DES to mid LAD.   [name] Chest pain   . Chronic systolic CHF (congestive heart failure) ([place]) 03/21/2016   Echo 01/30/17: Diff HK, mild focal basal septal hypertrophy, EF 30-35, mild AI, MAC,  mild MR // Echo 06/08/16: Mild focal basal septal hypertrophy, EF 25-30%, diff HK, ant-septal AK, Gr 1 DD, mild AI, MAC, mild MR, PASP 37 mmHg // Echo 03/18/16: EF 30-35%, ant-septal AK, Gr 1 DD, mild MR, severe LAE.  [name] Constipation   . Depression   . Diabetes mellitus type 2 in obese ([place])   . Dizziness   . Glaucoma   . Gout   . Heart disease   . Heartburn   . History of acute anterior wall MI 03/17/2016  . History of heart attack   . Hyperlipemia   . Hypertension   . Ischemic cardiomyopathy 10/02/2016   Refused ICD  . Joint pain   . Left  bundle branch block   . Myocardial infarction ([place])   . Nausea   . Sleep apnea   . SOB (shortness of breath)   . Swelling    feet or legs    Past Surgical History:  Procedure Laterality Date  . APPENDECTOMY    . CARDIAC CATHETERIZATION N/A 03/17/2016   Procedure: Left Heart Cath and Coronary Angiography;  Surgeon: Sherren Mocha, MD;  Location: [facility];  Service: Cardiovascular;  Laterality: N/A;  . CARDIAC CATHETERIZATION N/A 03/17/2016   Procedure: Coronary Stent Intervention;  Surgeon: Sherren Mocha, MD;  Location: [facility];  Service: Cardiovascular;  Laterality: N/A;  . COLONOSCOPY WITH PROPOFOL N/A 08/13/2017   Procedure: COLONOSCOPY WITH PROPOFOL;  Surgeon: Jonathon Bellows, MD;  Location: [facility] ENDOSCOPY;  Service: Gastroenterology;  Laterality: N/A;  . SMALL BOWEL REPAIR    . TONSILLECTOMY    . UTERINE FIBROID SURGERY      Family Psychiatric History: I have reviewed family psychiatric history from my progress note on 02/19/2018  Family History:  Family History  Problem Relation Age of Onset  . Anxiety disorder Mother   . Paranoid behavior Mother   . Hypertension Mother   . Heart failure Mother   . Stroke Mother   . Dementia Mother   . High Cholesterol Mother   . Diabetes Mother   . Hyperlipidemia Mother   . Heart disease Mother   . Depression Mother   . Hypertension Father   . High Cholesterol Father   . Mood Disorder Sister   . Stroke Sister   . Anxiety disorder Maternal Aunt   . Drug abuse Cousin   . Tuberculosis Paternal Grandfather     Social History: Reviewed social history from my progress note on 02/19/2018 Social History   Socioeconomic History  . Marital status: Married    Spouse name: Not on file  . Number of children: 0  . Years of education: BS in education  . Highest education level: Not on file  Occupational History  . Occupation:  Retired Education officer, museum  Social Needs  . Financial resource strain: Not on file  . Food  insecurity:    Worry: Not on file    Inability: Not on file  . Transportation needs:    Medical: Not on file    Non-medical: Not on file  Tobacco Use  . Smoking status: Former Smoker    Types: Cigarettes    Last attempt to quit: 04/13/1997    Years since quitting: 21.7  . Smokeless tobacco: Never Used  Substance and Sexual Activity  . Alcohol use: Yes    Alcohol/week: 1.0 standard drinks    Types: 1 Glasses of wine per week    Comment: 2-3 glasses of wine per month  . Drug use:  No  . Sexual activity: Never  Lifestyle  . Physical activity:    Days per week: Not on file    Minutes per session: Not on file  . Stress: Not on file  Relationships  . Social connections:    Talks on phone: Not on file    Gets together: Not on file    Attends religious service: Not on file    Active member of club or organization: Not on file    Attends meetings of clubs or organizations: Not on file    Relationship status: Not on file  Other Topics Concern  . Not on file  Social History Narrative   Lives in [place] with spouse.  No children.   Retired first [employer] for over 30 years ([place] for 10 years and then in [place] for over 20 years).   Left-handed    Allergies:  Allergies  Allergen Reactions  . Tetracycline Swelling  . Meperidine Nausea And Vomiting    Nausea    Metabolic Disorder Labs: Lab Results  Component Value Date   HGBA1C 7.4 (H) 12/03/2018   MPG 131 03/18/2016   No results found for: PROLACTIN Lab Results  Component Value Date   CHOL 149 10/06/2018   TRIG 101 10/06/2018   HDL 44 10/06/2018   CHOLHDL 3.4 10/06/2018   VLDL 22.6 01/03/2017   LDLCALC 85 10/06/2018   LDLCALC 65 03/21/2017   Lab Results  Component Value Date   TSH 1.41 02/07/2018   TSH 0.934 06/03/2017    Therapeutic Level Labs: No results found for: LITHIUM No results found for: VALPROATE No components found for:  CBMZ  Current Medications: Current Outpatient Medications   Medication Sig Dispense Refill  . aspirin EC 81 MG EC tablet Take 1 tablet (81 mg total) by mouth daily.    [name] atorvastatin (LIPITOR) 80 MG tablet Take 1 tablet (80 mg total) by mouth daily. 90 tablet 3  . carvedilol (COREG) 12.5 MG tablet Take 1 tablet (12.5 mg total) by mouth 2 (two) times daily with a meal. 180 tablet 3  . dorzolamide-timolol (COSOPT) 22.3-6.8 MG/ML ophthalmic solution Place 1 drop into both eyes 2 (two) times daily.     [name] ezetimibe (ZETIA) 10 MG tablet Take 1 tablet (10 mg total) by mouth daily. 90 tablet 3  . furosemide (LASIX) 40 MG tablet Take 1 tablet (40 mg total) by mouth as directed. 40 mg on Mon, Wed and Fri's; all other days 20 mg 65 tablet 3  . isosorbide mononitrate (IMDUR) 30 MG 24 hr tablet Take 1 tablet (30 mg total) by mouth daily. 90 tablet 3  . LORazepam (ATIVAN) 0.5 MG tablet Take 1 tablet (0.5 mg total) by mouth 2 (two) times daily. 28 tablet 1  . metFORMIN (GLUCOPHAGE) 500 MG tablet TAKE 1 TABLET BY MOUTH TWICE DAILY WITH MEALS 60 tablet 0  . nitroGLYCERIN (NITROSTAT) 0.4 MG SL tablet Place 1 tablet (0.4 mg total) under the tongue every 5 (five) minutes x 3 doses as needed for chest pain (Do not exceed 3 doses). 25 tablet 11  . olmesartan (BENICAR) 20 MG tablet Take 1 tablet (20 mg total) by mouth daily. 90 tablet 3  . ondansetron (ZOFRAN) 4 MG tablet TAKE 1 TABLET BY MOUTH ONCE DAILY AS NEEDED FOR NAUSEA AND VOMITING 20 tablet 1  . spironolactone (ALDACTONE) 25 MG tablet TAKE 1/2 (ONE-HALF) TABLET BY MOUTH ONCE DAILY 45 tablet 3  . traZODone (DESYREL) 100 MG tablet Take 1 tablet (  100 mg total) by mouth at bedtime. 30 tablet 0  . venlafaxine XR (EFFEXOR-XR) 150 MG 24 hr capsule TAKE 2 CAPSULES BY MOUTH ONCE DAILY 60 capsule 0  . OLANZapine (ZYPREXA) 5 MG tablet Take 1 tablet (5 mg total) by mouth at bedtime. For anxiety and sleep 30 tablet 1   No current facility-administered medications for this visit.      Musculoskeletal: Strength & Muscle Tone:  within normal limits Gait & Station: normal Patient leans: N/A  Psychiatric Specialty Exam: Review of Systems  Psychiatric/Behavioral: Positive for depression. The patient is nervous/anxious and has insomnia.   All other systems reviewed and are negative.   Blood pressure (!) 168/86, pulse 92, temperature 97.6 F (36.4 C), temperature source Oral, weight 178 lb 6.4 oz (80.9 kg).Body mass index is 31.6 kg/m.  General Appearance: Casual  Eye Contact:  Fair  Speech:  Clear and Coherent  Volume:  Normal  Mood:  Anxious and Dysphoric  Affect:  Tearful  Thought Process:  Goal Directed and Descriptions of Associations: Intact  Orientation:  Full (Time, Place, and Person)  Thought Content: Logical   Suicidal Thoughts:  No  Homicidal Thoughts:  No  Memory:  Immediate;   Fair Recent;   Fair Remote;   Fair  Judgement:  Fair  Insight:  Fair  Psychomotor Activity:  Normal  Concentration:  Concentration: Fair and Attention Span: Fair  Recall:  [employer] of Knowledge: Fair  Language: Fair  Akathisia:  No  Handed:  Right  AIMS (if indicated): denies tremors, rigidity,stiffness  Assets:  Communication Skills Desire for Improvement Social Support  ADL's:  Intact  Cognition: WNL  Sleep:  Poor   Screenings: PHQ2-9     Office Visit from 06/03/2017 in [facility] Office Visit from 01/03/2017 in [facility]  PHQ-2 Total Score  4  0  PHQ-9 Total Score  11  -       Assessment and Plan: [place] is a 68 year old African-American female who has a history of depression, OCD, OSA, presented to clinic today for a follow-up visit.  Patient continues to report worsening OCD symptoms.  Patient also with noncompliance to treatment recommendations.  We will continue to make medication changes.  Plan as noted below.  Plan For depression- unstable Effexor extended release 300 mg p.o. daily Add Zyprexa 5 mg p.o. nightly  For OCD-unstable Effexor extended  release 300 mg p.o. daily Ativan 0.5 mg p.o. twice daily as needed for severe anxiety symptoms. I have reviewed [place] controlled substance database.  For insomnia-unstable Restart trazodone 100 mg p.o. nightly Discontinue Ambien for noncompliance.  I have coordinated care with cardiologist Dr. Sherren Mocha dated 12/05/2018-per recommendations per Dr. Burt Knack patient can be continued on SNRIs as well as antipsychotic medications like Zyprexa.  She does have a history of prolonged QT however that is not due to her medications.  QT is prolonged primarily because of a left bundle branch block pattern with most of the conduction delay being intraventricular.'  I will refer patient for possible TMS therapy- will [facility] to [facility].  Patient agrees with plan.  I have discussed with patient about GeneSight testing given history of failing multiple trials of medication.  Patient with elevated blood pressure reading today reports she is noncompliant on her antihypertensive medication.  Encouraged her to stay compliant and follow-up with her PMD.  Follow-up in clinic in 3 weeks or sooner if needed.  Patient in the meantime will  follow up with our therapist here in clinic.  Provided education about the need for compliance with treatment recommendations.  Discussed with patient that she can be terminated from clinic if she does not follow recommendations.   I have spent atleast 25 minutes face to face with patient today. More than 50 % of the time was spent for psychoeducation and supportive psychotherapy and care coordination.  This note was generated in part or whole with voice recognition software. Voice recognition is usually quite accurate but there are transcription errors that can and very often do occur. I apologize for any typographical errors that were not detected and corrected.             Ursula Alert, MD 01/05/2019, 5:50 PM

## 2019-01-07 ENCOUNTER — Other Ambulatory Visit: Payer: Self-pay | Admitting: Family Medicine

## 2019-01-08 ENCOUNTER — Other Ambulatory Visit: Payer: Self-pay | Admitting: Family Medicine

## 2019-01-12 ENCOUNTER — Ambulatory Visit: Payer: Medicare Other | Admitting: Licensed Clinical Social Worker

## 2019-01-13 ENCOUNTER — Other Ambulatory Visit (INDEPENDENT_AMBULATORY_CARE_PROVIDER_SITE_OTHER): Payer: Medicare Other

## 2019-01-13 DIAGNOSIS — D649 Anemia, unspecified: Secondary | ICD-10-CM

## 2019-01-13 DIAGNOSIS — R748 Abnormal levels of other serum enzymes: Secondary | ICD-10-CM | POA: Diagnosis not present

## 2019-01-13 LAB — CBC
HCT: 36.9 % (ref 36.0–46.0)
Hemoglobin: 12.4 g/dL (ref 12.0–15.0)
MCHC: 33.6 g/dL (ref 30.0–36.0)
MCV: 81.1 fl (ref 78.0–100.0)
Platelets: 333 10*3/uL (ref 150.0–400.0)
RBC: 4.55 Mil/uL (ref 3.87–5.11)
RDW: 14.5 % (ref 11.5–15.5)
WBC: 6.4 10*3/uL (ref 4.0–10.5)

## 2019-01-13 LAB — HEPATIC FUNCTION PANEL
ALT: 14 U/L (ref 0–35)
AST: 14 U/L (ref 0–37)
Albumin: 4.5 g/dL (ref 3.5–5.2)
Alkaline Phosphatase: 131 U/L — ABNORMAL HIGH (ref 39–117)
Bilirubin, Direct: 0.1 mg/dL (ref 0.0–0.3)
Total Bilirubin: 0.4 mg/dL (ref 0.2–1.2)
Total Protein: 7.2 g/dL (ref 6.0–8.3)

## 2019-01-13 LAB — IRON,TIBC AND FERRITIN PANEL
%SAT: 28 % (calc) (ref 16–45)
Ferritin: 81 ng/mL (ref 16–288)
Iron: 94 ug/dL (ref 45–160)
TIBC: 335 mcg/dL (calc) (ref 250–450)

## 2019-01-23 ENCOUNTER — Other Ambulatory Visit: Payer: Medicare Other

## 2019-01-26 ENCOUNTER — Other Ambulatory Visit: Payer: Self-pay | Admitting: Family Medicine

## 2019-01-26 DIAGNOSIS — R748 Abnormal levels of other serum enzymes: Secondary | ICD-10-CM

## 2019-02-02 ENCOUNTER — Other Ambulatory Visit: Payer: Self-pay | Admitting: Psychiatry

## 2019-02-02 DIAGNOSIS — F331 Major depressive disorder, recurrent, moderate: Secondary | ICD-10-CM

## 2019-02-09 ENCOUNTER — Other Ambulatory Visit: Payer: Medicare Other

## 2019-02-20 DIAGNOSIS — F410 Panic disorder [episodic paroxysmal anxiety] without agoraphobia: Secondary | ICD-10-CM | POA: Diagnosis not present

## 2019-03-05 ENCOUNTER — Other Ambulatory Visit: Payer: Self-pay | Admitting: Psychiatry

## 2019-03-05 DIAGNOSIS — F429 Obsessive-compulsive disorder, unspecified: Secondary | ICD-10-CM

## 2019-03-06 MED ORDER — LORAZEPAM 0.5 MG PO TABS: 0.5000 mg | ORAL_TABLET | ORAL | 1 refills | Status: DC

## 2019-03-06 NOTE — Telephone Encounter (Signed)
Sent ativan to pharmacy

## 2019-03-13 ENCOUNTER — Other Ambulatory Visit: Payer: Self-pay | Admitting: Psychiatry

## 2019-03-13 DIAGNOSIS — F331 Major depressive disorder, recurrent, moderate: Secondary | ICD-10-CM

## 2019-03-23 DIAGNOSIS — H35371 Puckering of macula, right eye: Secondary | ICD-10-CM | POA: Diagnosis not present

## 2019-03-23 DIAGNOSIS — Z961 Presence of intraocular lens: Secondary | ICD-10-CM | POA: Diagnosis not present

## 2019-03-23 DIAGNOSIS — H401132 Primary open-angle glaucoma, bilateral, moderate stage: Secondary | ICD-10-CM | POA: Diagnosis not present

## 2019-03-23 DIAGNOSIS — E119 Type 2 diabetes mellitus without complications: Secondary | ICD-10-CM | POA: Diagnosis not present

## 2019-03-26 ENCOUNTER — Other Ambulatory Visit: Payer: Self-pay

## 2019-03-26 MED ORDER — METFORMIN HCL 500 MG PO TABS: 500.0000 mg | ORAL_TABLET | Freq: Two times a day (BID) | ORAL | 3 refills | Status: DC

## 2019-03-26 NOTE — Telephone Encounter (Signed)
Refill request metformin 500 mg

## 2019-04-06 ENCOUNTER — Telehealth: Payer: Self-pay | Admitting: *Deleted

## 2019-04-06 NOTE — Telephone Encounter (Signed)

## 2019-04-07 ENCOUNTER — Other Ambulatory Visit: Payer: Medicare Other

## 2019-04-07 ENCOUNTER — Other Ambulatory Visit: Payer: Self-pay

## 2019-04-07 DIAGNOSIS — I2510 Atherosclerotic heart disease of native coronary artery without angina pectoris: Secondary | ICD-10-CM

## 2019-04-07 LAB — HEPATIC FUNCTION PANEL
ALT: 16 IU/L (ref 0–32)
AST: 18 IU/L (ref 0–40)
Albumin: 4.2 g/dL (ref 3.8–4.8)
Alkaline Phosphatase: 152 IU/L — ABNORMAL HIGH (ref 39–117)
Bilirubin Total: 0.3 mg/dL (ref 0.0–1.2)
Bilirubin, Direct: 0.09 mg/dL (ref 0.00–0.40)
Total Protein: 6.9 g/dL (ref 6.0–8.5)

## 2019-04-07 LAB — LIPID PANEL
Chol/HDL Ratio: 3.9 ratio (ref 0.0–4.4)
Cholesterol, Total: 154 mg/dL (ref 100–199)
HDL: 39 mg/dL — ABNORMAL LOW (ref >39)
LDL Calculated: 92 mg/dL (ref 0–99)
Triglycerides: 116 mg/dL (ref 0–149)
VLDL Cholesterol Cal: 23 mg/dL (ref 5–40)

## 2019-04-16 ENCOUNTER — Other Ambulatory Visit: Payer: Self-pay | Admitting: Psychiatry

## 2019-04-16 DIAGNOSIS — F331 Major depressive disorder, recurrent, moderate: Secondary | ICD-10-CM

## 2019-04-22 DIAGNOSIS — F419 Anxiety disorder, unspecified: Secondary | ICD-10-CM | POA: Diagnosis not present

## 2019-07-01 ENCOUNTER — Ambulatory Visit (INDEPENDENT_AMBULATORY_CARE_PROVIDER_SITE_OTHER): Payer: Medicare Other | Admitting: Physician Assistant

## 2019-07-01 ENCOUNTER — Encounter: Payer: Self-pay | Admitting: Physician Assistant

## 2019-07-01 ENCOUNTER — Other Ambulatory Visit: Payer: Self-pay

## 2019-07-01 VITALS — BP 132/70 | HR 79 | Ht 63.0 in | Wt 177.8 lb

## 2019-07-01 DIAGNOSIS — E7800 Pure hypercholesterolemia, unspecified: Secondary | ICD-10-CM | POA: Diagnosis not present

## 2019-07-01 DIAGNOSIS — I5022 Chronic systolic (congestive) heart failure: Secondary | ICD-10-CM | POA: Diagnosis not present

## 2019-07-01 DIAGNOSIS — I10 Essential (primary) hypertension: Secondary | ICD-10-CM | POA: Diagnosis not present

## 2019-07-01 DIAGNOSIS — R42 Dizziness and giddiness: Secondary | ICD-10-CM | POA: Diagnosis not present

## 2019-07-01 DIAGNOSIS — I2510 Atherosclerotic heart disease of native coronary artery without angina pectoris: Secondary | ICD-10-CM | POA: Diagnosis not present

## 2019-07-01 DIAGNOSIS — E119 Type 2 diabetes mellitus without complications: Secondary | ICD-10-CM | POA: Diagnosis not present

## 2019-07-01 NOTE — Patient Instructions (Signed)
Medication Instructions:  Your physician recommends that you continue on your current medications as directed. Please refer to the Current Medication list given to you today.   If you need a refill on your cardiac medications before your next appointment, please call your pharmacy.   Lab work: NONE  If you have labs (blood work) drawn today and your tests are completely normal, you will receive your results only by: [name] MyChart Message (if you have MyChart) OR . A paper copy in the mail If you have any lab test that is abnormal or we need to change your treatment, we will call you to review the results.  Testing/Procedures: NONE  Follow-Up: At [facility], you and your health needs are our priority.  As part of our continuing mission to provide you with exceptional heart care, we have created designated Provider Care Teams.  These Care Teams include your primary Cardiologist (physician) and Advanced Practice Providers (APPs -  Physician Assistants and Nurse Practitioners) who all work together to provide you with the care you need, when you need it. . You will need a follow up appointment in:  6 months.  Please call our office 2 months in advance to schedule this appointment.  You may see Sherren Mocha, MD or one of the following Advanced Practice Providers on your designated Care Team: . Richardson Dopp, PA-C . Vin Bhagat, PA-C . Daune Perch, NP  Any Other Special Instructions Will Be Listed Below (If Applicable).   * TRY TO CHECK YOUR BLOOD SUGAR WHEN YOU GET LIGHTHEADED. IF YOU ARE STILL LIGHTHEADED AND SPELLS STILL CONTINUE AND YOUR BLOOD SUGAR IS NORMAL, PLEASE CALL OUR OFFICE SO WE CAN GET YOU SET UP FOR A HEART MONITOR.

## 2019-07-01 NOTE — Progress Notes (Signed)
Cardiology Office Note:    Date:  07/01/2019   ID:  Melanie Ray, DOB [birth year], MRN [id number]  PCP:  Leone Haven, MD  Cardiologist:  Sherren Mocha, MD  Electrophysiologist:  None   Referring MD: Leone Haven, MD   Chief Complaint  Patient presents with  . Follow-up    CHF, CAD    History of Present Illness:    Melanie Ray is a 68 y.o. female with:  Coronary artery disease   S/p Anterior STEMI 5/17 >> PCI: DES x 2 to LAD  Chronic systolic CHF  Ischemic CM  EF 30-35 at time of MI >> DC on [facility] >> Pt DC'd at FU visit  Echocardiogram 8/17: EF 25-30  Echocardiogram 3/18: EF 30-35  Echocardiogram 8/19: EF 30-35  EP eval (J [facility]) >> pt declined CRT-D  Intol of Entresto  Obsessive Compulsive d/o  Diabetes mellitus 2  Hypertension   Hyperlipidemia   LBBB  OSA   Melanie Ray was last seen in 12/2018. She was struggling at that time with her OCD and there were some questions about her medications and QT prolongation.  She has a LBBB and I reviewed her ECG with Dr. Caryl Comes.  Her corrected QT was 452 which should allow her to take 2nd generation antipsychotic drugs.    She returns for follow up.  She is here alone.  She is now on Latuda for her OCD.  She feels that this is helping.  She has not had chest discomfort or significant shortness of breath.  She has not had orthopnea, paroxysmal nocturnal dyspnea, significant leg swelling.  She has not had syncope.  However, she has had episodic lightheadedness.  She has had this for years.  It predates her myocardial infarction.  She sometimes feels better with eating.  Prior CV studies:   The following studies were reviewed today:  Echo 06/30/18 Mild conc LVH, EF 30-35, ant-sept/inf/inf-sept HK, Gr 1 DD, mild to mod AI, MAC, mobile density on MV unchanged, mild MR, mild TR, PASP 39  Echo 01/30/17 Diff HK, mild focal basal septal hypertrophy, EF 30-35, mild AI, MAC, mild MR  Echo 06/08/16  Mild focal basal septal hypertrophy, EF 25-30%, diff HK, ant-septal AK, Gr 1 DD, mild AI, MAC, mild MR, PASP 37 mmHg  Echo 03/18/16 EF 30-35%, ant-septal AK, Gr 1 DD, mild MR, severe LAE.  LHC 03/17/16 LAD proximal 80%, mid 80%, distal 50%, ostial D1 60%  LCx with LPDA lesion, 30%  RCA Mild calcification with no significant stenosis in a medium caliber, nondominant RCA LVEF is estimated at 45% with inferoapical and lateral wall akinesis PCI: PCI: 3.5 x 24 mm Promus DES to prox LAD, 2.5 x 12 mm Promus DES to mid LAD.  Past Medical History:  Diagnosis Date  . Anemia   . Anxiety   . Back pain   . CAD (coronary artery disease) 04/11/2016   S/p ant STEMI 5/17: LHC >> LAD proximal 80%, mid 80%, distal 50%, ostial D1 60%; LCx with LPDA lesion 30%; RCA Mild calcification with no significant stenosis in a medium caliber, nondominant RCA; LVEF is estimated at 45% with inferoapical and lateral wall akinesis >> PCI: PCI: 3.5 x 24 mm Promus DES to prox LAD, 2.5 x 12 mm Promus DES to mid LAD.   [name] Chest pain   . Chronic systolic CHF (congestive heart failure) ([place]) 03/21/2016   Echo 01/30/17: Diff HK, mild focal basal septal hypertrophy, EF 30-35, mild AI, MAC, mild MR //  Echo 06/08/16: Mild focal basal septal hypertrophy, EF 25-30%, diff HK, ant-septal AK, Gr 1 DD, mild AI, MAC, mild MR, PASP 37 mmHg // Echo 03/18/16: EF 30-35%, ant-septal AK, Gr 1 DD, mild MR, severe LAE.  [name] Constipation   . Depression   . Diabetes mellitus type 2 in obese ([place])   . Dizziness   . Glaucoma   . Gout   . Heart disease   . Heartburn   . History of acute anterior wall MI 03/17/2016  . History of heart attack   . Hyperlipemia   . Hypertension   . Ischemic cardiomyopathy 10/02/2016   Refused ICD  . Joint pain   . Left bundle branch block   . Myocardial infarction ([place])   . Nausea   . Sleep apnea   . SOB (shortness of breath)   . Swelling    feet or legs   Surgical Hx: The patient  has a past surgical history  that includes Small bowel repair; Appendectomy; Tonsillectomy; Cardiac catheterization (N/A, 03/17/2016); Cardiac catheterization (N/A, 03/17/2016); Uterine fibroid surgery; and Colonoscopy with propofol (N/A, 08/13/2017).   Current Medications: Current Meds  Medication Sig  . aspirin EC 81 MG EC tablet Take 1 tablet (81 mg total) by mouth daily.  [name] atorvastatin (LIPITOR) 80 MG tablet Take 1 tablet (80 mg total) by mouth daily.  . carvedilol (COREG) 12.5 MG tablet Take 1 tablet (12.5 mg total) by mouth 2 (two) times daily with a meal.  . dorzolamide-timolol (COSOPT) 22.3-6.8 MG/ML ophthalmic solution Place 1 drop into both eyes 2 (two) times daily.   [name] ezetimibe (ZETIA) 10 MG tablet Take 1 tablet (10 mg total) by mouth daily.  . furosemide (LASIX) 40 MG tablet Take 1 tablet (40 mg total) by mouth as directed. 40 mg on Mon, Wed and Fri's; all other days 20 mg  . hydrOXYzine (ATARAX/VISTARIL) 50 MG tablet Take 50 mg by mouth every 6 (six) hours as needed. for anxiety  . isosorbide mononitrate (IMDUR) 30 MG 24 hr tablet Take 1 tablet (30 mg total) by mouth daily.  [name] LORazepam (ATIVAN) 0.5 MG tablet Take 1 tablet (0.5 mg total) by mouth as directed. Take it 2-3  times a week only for severe anxiety attacks as needed  . lurasidone (LATUDA) 20 MG TABS tablet Take 20 mg by mouth daily.  . metFORMIN (GLUCOPHAGE) 500 MG tablet Take 1 tablet (500 mg total) by mouth 2 (two) times daily with a meal.  . nitroGLYCERIN (NITROSTAT) 0.4 MG SL tablet Place 1 tablet (0.4 mg total) under the tongue every 5 (five) minutes x 3 doses as needed for chest pain (Do not exceed 3 doses).  . OLANZapine (ZYPREXA) 5 MG tablet Take 1 tablet (5 mg total) by mouth at bedtime. For anxiety and sleep  . olmesartan (BENICAR) 20 MG tablet Take 1 tablet (20 mg total) by mouth daily.  . ondansetron (ZOFRAN) 4 MG tablet TAKE 1 TABLET BY MOUTH ONCE DAILY AS NEEDED FOR NAUSEA AND VOMITING  . spironolactone (ALDACTONE) 25 MG tablet TAKE 1/2  (ONE-HALF) TABLET BY MOUTH ONCE DAILY  . traZODone (DESYREL) 100 MG tablet Take 100 mg by mouth at bedtime as needed for sleep.  [name] venlafaxine XR (EFFEXOR-XR) 150 MG 24 hr capsule TAKE 2 CAPSULES BY MOUTH ONCE DAILY (NEEDS  APPOINTMENT  FOR  FURTHER  REFILLS)     Allergies:   Tetracycline and Meperidine   Social History   Tobacco Use  . Smoking status: Former Smoker  Types: Cigarettes    Quit date: 04/13/1997    Years since quitting: 22.2  . Smokeless tobacco: Never Used  Substance Use Topics  . Alcohol use: Yes    Alcohol/week: 1.0 standard drinks    Types: 1 Glasses of wine per week    Comment: 2-3 glasses of wine per month  . Drug use: No     Family Hx: The patient's family history includes Anxiety disorder in her maternal aunt and mother; Dementia in her mother; Depression in her mother; Diabetes in her mother; Drug abuse in her cousin; Heart disease in her mother; Heart failure in her mother; High Cholesterol in her father and mother; Hyperlipidemia in her mother; Hypertension in her father and mother; Mood Disorder in her sister; Paranoid behavior in her mother; Stroke in her mother and sister; Tuberculosis in her paternal grandfather.  ROS:   Please see the history of present illness.    ROS All other systems reviewed and are negative.   EKGs/Labs/Other Test Reviewed:    EKG:  EKG is  ordered today.  The ekg ordered today demonstrates normal sinus rhythm, heart rate 81, left bundle branch block, no change from prior tracing  Recent Labs: 12/03/2018: BUN 27; Creatinine, Ser 1.12; Potassium 4.1; Sodium 139 01/13/2019: Hemoglobin 12.4; Platelets 333.0 04/07/2019: ALT 16   Recent Lipid Panel Lab Results  Component Value Date/Time   CHOL 154 04/07/2019 10:43 AM   TRIG 116 04/07/2019 10:43 AM   HDL 39 (L) 04/07/2019 10:43 AM   CHOLHDL 3.9 04/07/2019 10:43 AM   CHOLHDL 4 01/03/2017 09:47 AM   LDLCALC 92 04/07/2019 10:43 AM   LDLDIRECT 78.0 02/07/2018 09:28 AM     Physical Exam:    VS:  BP 132/70   Pulse 79   Ht _0  (1.6 m)   Wt 177 lb 12.8 oz (80.6 kg)   SpO2 95%   BMI 31.50 kg/m     Wt Readings from Last 3 Encounters:  07/01/19 177 lb 12.8 oz (80.6 kg)  12/30/18 176 lb 1.9 oz (79.9 kg)  12/03/18 177 lb 3.2 oz (80.4 kg)     Physical Exam  Constitutional: She is oriented to person, place, and time. She appears well-developed and well-nourished. No distress.  HENT:  Head: Normocephalic and atraumatic.  Eyes: No scleral icterus.  Neck: No JVD present. No thyromegaly present.  Cardiovascular: Normal rate and regular rhythm.  Murmur heard.  Systolic murmur is present with a grade of 1/6. Pulses:      Dorsalis pedis pulses are 2+ on the right side and 2+ on the left side.       Posterior tibial pulses are 2+ on the right side and 2+ on the left side.  Pulmonary/Chest: Effort normal and breath sounds normal. She has no rales.  Abdominal: Soft. There is no hepatomegaly.  Musculoskeletal:        General: No edema.  Lymphadenopathy:    She has no cervical adenopathy.  Neurological: She is alert and oriented to person, place, and time.  Skin: Skin is warm and dry.  Psychiatric: She has a normal mood and affect.    ASSESSMENT & PLAN:    1. Chronic systolic CHF (congestive heart failure) ([place]) EF 30-35 by echo August 2019.  She has mild to moderate aortic insufficiency and mild mitral vegetation.  She has declined ICD implantation in the past.  Her blood pressure could possibly tolerate a higher dose of spironolactone.  However, with her episodes of lightheadedness, I  am hesitant to adjust her medication any further.  Continue current dose of carvedilol, isosorbide, olmesartan, spironolactone.  Follow-up in 6 months.  2. Coronary artery disease involving native coronary artery of native heart without angina pectoris History of anterior myocardial infarction May 2017 treated with drug-eluting stent x2 to the LAD.  She is doing well without  anginal symptoms.  Continue aspirin, statin, beta-blocker.  3. Essential hypertension The patient's blood pressure is controlled on her current regimen.  Continue current therapy.   4. Type 2 diabetes mellitus without complication, without long-term current use of insulin ([place]) Continue follow-up with primary care.  She is concerned about a recent recall regarding metformin.  I have asked her to contact her pharmacy to make sure her manufacturer is not involved in the recall.  He also plans to speak to her primary care provider about potential memory loss issues with metformin.  5. Pure hypercholesterolemia LDL optimal on most recent lab work.  Continue current Rx.    6. Dizziness She has had these episodes for years.  It sounds like she is describing hypoglycemic episodes.  I have asked her to continue to monitor these.  If her blood sugar is normal during these episodes, we could proceed with an event monitor to further evaluate.   Dispo:  Return in about 6 months (around 01/01/2020) for Routine Follow Up, w/ Dr. Burt Knack, (virtual or in-person).   Medication Adjustments/Labs and Tests Ordered: Current medicines are reviewed at length with the patient today.  Concerns regarding medicines are outlined above.  Tests Ordered: Orders Placed This Encounter  Procedures  . EKG 12-Lead   Medication Changes: No orders of the defined types were placed in this encounter.   Signed, Richardson Dopp, PA-C  07/01/2019 3:03 PM    [facility] [place], [place], [place]  [address] Phone: [phone]; Fax: [phone]

## 2019-07-10 ENCOUNTER — Ambulatory Visit (INDEPENDENT_AMBULATORY_CARE_PROVIDER_SITE_OTHER): Payer: Medicare Other | Admitting: Family Medicine

## 2019-07-10 ENCOUNTER — Other Ambulatory Visit: Payer: Self-pay

## 2019-07-10 ENCOUNTER — Encounter: Payer: Self-pay | Admitting: Family Medicine

## 2019-07-10 VITALS — BP 140/70 | HR 81 | Temp 96.9°F

## 2019-07-10 DIAGNOSIS — M8580 Other specified disorders of bone density and structure, unspecified site: Secondary | ICD-10-CM

## 2019-07-10 DIAGNOSIS — I2510 Atherosclerotic heart disease of native coronary artery without angina pectoris: Secondary | ICD-10-CM

## 2019-07-10 DIAGNOSIS — R42 Dizziness and giddiness: Secondary | ICD-10-CM

## 2019-07-10 DIAGNOSIS — E669 Obesity, unspecified: Secondary | ICD-10-CM

## 2019-07-10 DIAGNOSIS — Z780 Asymptomatic menopausal state: Secondary | ICD-10-CM

## 2019-07-10 DIAGNOSIS — F330 Major depressive disorder, recurrent, mild: Secondary | ICD-10-CM

## 2019-07-10 DIAGNOSIS — E119 Type 2 diabetes mellitus without complications: Secondary | ICD-10-CM

## 2019-07-10 LAB — HEMOGLOBIN A1C: Hgb A1c MFr Bld: 6.8 % — ABNORMAL HIGH (ref 4.6–6.5)

## 2019-07-10 LAB — COMPREHENSIVE METABOLIC PANEL
ALT: 16 U/L (ref 0–35)
AST: 16 U/L (ref 0–37)
Albumin: 4.2 g/dL (ref 3.5–5.2)
Alkaline Phosphatase: 134 U/L — ABNORMAL HIGH (ref 39–117)
BUN: 17 mg/dL (ref 6–23)
CO2: 28 mEq/L (ref 19–32)
Calcium: 10.1 mg/dL (ref 8.4–10.5)
Chloride: 107 mEq/L (ref 96–112)
Creatinine, Ser: 0.96 mg/dL (ref 0.40–1.20)
GFR: 69.69 mL/min (ref >60.00)
Glucose, Bld: 72 mg/dL (ref 70–99)
Potassium: 4.4 mEq/L (ref 3.5–5.1)
Sodium: 142 mEq/L (ref 135–145)
Total Bilirubin: 0.4 mg/dL (ref 0.2–1.2)
Total Protein: 7.2 g/dL (ref 6.0–8.3)

## 2019-07-10 NOTE — Patient Instructions (Signed)
Nice to see you. We will contact you with your results.  Please consider the prevnar pneumonia vaccine and the shingles vaccine.  Please call to schedule your dexa scan.

## 2019-07-10 NOTE — Progress Notes (Signed)
Tommi Rumps, MD Phone: [phone]  Melanie Ray is a 68 y.o. female who presents today for follow-up.  Depression/anxiety/bipolar/schizophrenia: Patient notes she has been diagnosed with all of these things.  She continues to follow with psychiatry.  She does deal with these things on a daily basis.  Denies current SI.  She has had suicidal ideation in the past though nothing recently.  She feels as though she could not hurt the people that are around her and thus would not consider suicide.  She does note a prior attempted suicide years ago.  She does note some nausea when she gets anxious.    Dizziness: Patient has had issues with dizziness intermittently for years.  Notes it has improved at this time.  Obesity: Patient does not exercise.  She notes her diet is relatively poor.  They will eat fried chicken and red meat twice weekly.  They eat lots of sub sandwiches.  They do eat lots of vegetables.  Diabetes: Patient is due for A1c.  Osteopenia: Bone density scan due.  Patient defers a mammogram.  She notes no family history of breast cancer, ovarian cancer, or colon cancer. She had a colonoscopy and the prep was poor and she notes her insurance would not cover another one until 10 years. She declines flu vaccine.  She notes she will get a tetanus vaccine.  She declines pneumonia vaccine and Shingrix.  She notes she will consider those ones though needs to think about them prior to getting the vaccines. She quit smoking in 1998 and had smoked for 20 years off and on at half a pack a day.  Occasional alcohol use.  No illicit drug use.  Active Ambulatory Problems    Diagnosis Date Noted  . Essential hypertension 07/28/2015  . HLD (hyperlipidemia) 07/28/2015  . Glaucoma 07/28/2015  . Depression 07/28/2015  . Anxiety 07/28/2015  . Insomnia 07/28/2015  . Obesity (BMI 30.0-34.9) 07/28/2015  . DM type 2 (diabetes mellitus, type 2) ([place]) 08/03/2015  . History of acute anterior  wall MI 03/18/2016  . Chronic systolic CHF (congestive heart failure) ([place]) 03/21/2016  . CAD (coronary artery disease) 04/11/2016  . Ischemic cardiomyopathy 10/02/2016  . Dizziness 01/03/2017  . Hot flashes 04/23/2017  . Great toe pain, left 05/17/2017  . Vitamin D deficiency 06/17/2017  . Class 1 obesity without serious comorbidity with body mass index (BMI) of 30.0 to 30.9 in adult 06/17/2017  . Polyp of colon 04/27/2008  . Uterine leiomyoma 04/27/2008  . Generalized anxiety disorder 04/27/2008  . History of colonoscopy 04/27/2008  . Iron deficiency anemia, unspecified 04/27/2008  . Osteopenia 04/27/2008  . Other specified disorder of bladder 04/27/2008  . 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 Postmenopausal 07/02/2017  . Skin lesion 02/07/2018  . Hot flash, menopausal 02/07/2018  . Obsessive compulsive disorder 08/27/2018  . Light headedness 12/03/2018  . Left bundle branch block 12/03/2018  . QT prolongation 12/03/2018   Resolved Ambulatory Problems    Diagnosis Date Noted  . Dizziness 07/28/2015  . ST elevation myocardial infarction (STEMI) of anterior wall ([place]) 03/17/2016  . Nausea 01/03/2017  . Absolute anemia 06/17/2017  . Suicidal ideation 08/27/2018   Past Medical History:  Diagnosis Date  . Anemia   . Back pain   . Chest pain   . Constipation   . Diabetes mellitus type 2 in obese ([place])   . Gout   . Heart disease   . Heartburn   . History of heart attack   .  Hyperlipemia   . Hypertension   . Joint pain   . Myocardial infarction ([place])   . Sleep apnea   . SOB (shortness of breath)   . Swelling     Family History  Problem Relation Age of Onset  . Anxiety disorder Mother   . Paranoid behavior Mother   . Hypertension Mother   . Heart failure Mother   . Stroke Mother   . Dementia Mother   . High Cholesterol Mother   . Diabetes Mother   . Hyperlipidemia Mother   . Heart disease Mother   . Depression Mother   . Hypertension Father   .  High Cholesterol Father   . Mood Disorder Sister   . Stroke Sister   . Anxiety disorder Maternal Aunt   . Drug abuse Cousin   . Tuberculosis Paternal Grandfather     Social History   Socioeconomic History  . Marital status: Married    Spouse name: Not on file  . Number of children: 0  . Years of education: BS in education  . Highest education level: Not on file  Occupational History  . Occupation:  Retired Education officer, museum  Social Needs  . Financial resource strain: Not on file  . Food insecurity    Worry: Not on file    Inability: Not on file  . Transportation needs    Medical: Not on file    Non-medical: Not on file  Tobacco Use  . Smoking status: Former Smoker    Types: Cigarettes    Quit date: 04/13/1997    Years since quitting: 22.2  . Smokeless tobacco: Never Used  Substance and Sexual Activity  . Alcohol use: Yes    Alcohol/week: 1.0 standard drinks    Types: 1 Glasses of wine per week    Comment: 2-3 glasses of wine per month  . Drug use: No  . Sexual activity: Never  Lifestyle  . Physical activity    Days per week: Not on file    Minutes per session: Not on file  . Stress: Not on file  Relationships  . Social Herbalist on phone: Not on file    Gets together: Not on file    Attends religious service: Not on file    Active member of club or organization: Not on file    Attends meetings of clubs or organizations: Not on file    Relationship status: Not on file  . Intimate partner violence    Fear of current or ex partner: Not on file    Emotionally abused: Not on file    Physically abused: Not on file    Forced sexual activity: Not on file  Other Topics Concern  . Not on file  Social History Narrative   Lives in [place] with spouse.  No children.   Retired first [employer] for over 30 years ([place] for 10 years and then in [place] for over 20 years).   Left-handed    ROS  General:  Negative for nexplained weight loss, fever  Skin: Negative for new or changing mole, sore that won't heal HEENT: Negative for trouble hearing, trouble seeing, ringing in ears, mouth sores, hoarseness, change in voice, dysphagia. CV: Positive for edema, negative for chest pain, dyspnea, palpitations Resp: Negative for cough, dyspnea, hemoptysis GI: Negative for nausea, vomiting, diarrhea, constipation, abdominal pain, melena, hematochezia. GU: Positive for chronic leakage of urine and frequent urination, negative for dysuria, urinary hesitance, hematuria, vaginal or penile  discharge, sexual difficulty, lumps in testicle or breasts MSK: Negative for muscle cramps or aches, joint pain or swelling Neuro: For dizziness, negative for headaches, weakness, numbness, passing out/fainting Psych: Positive for depression, anxiety, memory problems  Objective  Physical Exam Vitals:   07/10/19 1412 07/10/19 1445  BP: (!) 152/80 140/70  Pulse: 81 81  Temp: (!) 96.9 F (36.1 C)   SpO2:  97%    BP Readings from Last 3 Encounters:  07/10/19 140/70  07/01/19 132/70  12/30/18 136/68   Wt Readings from Last 3 Encounters:  07/01/19 177 lb 12.8 oz (80.6 kg)  12/30/18 176 lb 1.9 oz (79.9 kg)  12/03/18 177 lb 3.2 oz (80.4 kg)    Physical Exam Constitutional:      General: She is not in acute distress.    Appearance: She is not diaphoretic.  HENT:     Head: Normocephalic and atraumatic.  Eyes:     Conjunctiva/sclera: Conjunctivae normal.     Pupils: Pupils are equal, round, and reactive to light.  Cardiovascular:     Rate and Rhythm: Normal rate and regular rhythm.     Heart sounds: Normal heart sounds.  Pulmonary:     Effort: Pulmonary effort is normal.     Breath sounds: Normal breath sounds.  Abdominal:     General: Bowel sounds are normal. There is no distension.     Palpations: Abdomen is soft.     Tenderness: There is no abdominal tenderness. There is no guarding or rebound.  Musculoskeletal:     Right lower leg: No edema.      Left lower leg: No edema.  Skin:    General: Skin is warm and dry.  Neurological:     Mental Status: She is alert.      Assessment/Plan:   DM type 2 (diabetes mellitus, type 2) (H[facility]) Check A1c.  Osteopenia Patient due for bone density scan.  Dizziness Chronic intermittent issue.  Improved at this time.  She will monitor.  Depression Chronic issue.  She will continue to follow with psychiatry for medication management.  Advised on suicidal ideation and that if she develops that she needs to seek medical attention in the emergency room.  Obesity (BMI 30.0-34.9) Discussed adding and walking for exercise 2 to 3 days a week to start with and building up from there.  Discussed healthy diet changes and cutting her fried fatty food and red meat in half per week.   Orders Placed This Encounter  Procedures  . DG Bone Density    Standing Status:   Future    Standing Expiration Date:   09/08/2020    Order Specific Question:   Reason for Exam (SYMPTOM  OR DIAGNOSIS REQUIRED)    Answer:   osteoporosis screening    Order Specific Question:   Preferred imaging location?    Answer:   [facility]  . Comp Met (CMET)  . HgB A1c    Meds ordered this encounter  Medications  . tetanus & diphtheria toxoids, adult, (TENIVAC) 5-2 LFU injection    Sig: Inject 0.5 mLs into the muscle once for 1 dose.    Dispense:  0.5 mL    Refill:  0     Tommi Rumps, MD [place]

## 2019-07-11 ENCOUNTER — Other Ambulatory Visit: Payer: Self-pay | Admitting: Family Medicine

## 2019-07-11 DIAGNOSIS — R748 Abnormal levels of other serum enzymes: Secondary | ICD-10-CM

## 2019-07-13 ENCOUNTER — Encounter: Payer: Self-pay | Admitting: Family Medicine

## 2019-07-13 MED ORDER — TETANUS-DIPHTHERIA TOXOIDS TD 5-2 LFU IM INJ: 0.5000 mL | INJECTION | Freq: Once | INTRAMUSCULAR | 0 refills | Status: AC

## 2019-07-13 NOTE — Assessment & Plan Note (Addendum)
Chronic issue.  She will continue to follow with psychiatry for medication management.  Advised on suicidal ideation and that if she develops that she needs to seek medical attention in the emergency room.

## 2019-07-13 NOTE — Assessment & Plan Note (Signed)
Patient due for bone density scan.

## 2019-07-13 NOTE — Assessment & Plan Note (Signed)
Check A1c. 

## 2019-07-13 NOTE — Assessment & Plan Note (Signed)
Chronic intermittent issue.  Improved at this time.  She will monitor.

## 2019-07-13 NOTE — Assessment & Plan Note (Signed)
Discussed adding and walking for exercise 2 to 3 days a week to start with and building up from there.  Discussed healthy diet changes and cutting her fried fatty food and red meat in half per week.

## 2019-07-21 ENCOUNTER — Other Ambulatory Visit: Payer: Self-pay | Admitting: Family Medicine

## 2019-07-21 MED ORDER — METFORMIN HCL 500 MG PO TABS: 500.0000 mg | ORAL_TABLET | Freq: Two times a day (BID) | ORAL | 3 refills | Status: DC

## 2019-07-21 NOTE — Telephone Encounter (Signed)
Medication Refill - Medication: metFORMIN (GLUCOPHAGE) 500 MG tablet, ondansetron (ZOFRAN) 4 MG tablet     Has the patient contacted their pharmacy? Yes.   Pt states she is out of these medications. Please advise,  (Agent: If no, request that the patient contact the pharmacy for the refill.) (Agent: If yes, when and what did the pharmacy advise?)  Preferred Pharmacy (with phone number or street name):  [place], [place] - [address]  [place]  [address]  Phone: [phone] Fax: [phone]  Not a 24 hour pharmacy; exact hours not known.     Agent: Please be advised that RX refills may take up to 3 business days. We ask that you follow-up with your pharmacy.

## 2019-07-21 NOTE — Telephone Encounter (Signed)
Requested medication (s) are due for refill today: yes  Requested medication (s) are on the active medication list: yes  Last refill:  05/29/2018  Future visit scheduled: yes  Notes to clinic: refill cannot be delegated   Requested Prescriptions  Pending Prescriptions Disp Refills   ondansetron (ZOFRAN) 4 MG tablet 20 tablet 1    Sig: TAKE 1 TABLET BY MOUTH ONCE DAILY AS NEEDED FOR NAUSEA AND VOMITING     Not Delegated - Gastroenterology: Antiemetics Failed - 07/21/2019  9:21 AM      Failed - This refill cannot be delegated      Passed - Valid encounter within last 6 months    Recent Outpatient Visits          1 week ago Type 2 diabetes mellitus without complication, without long-term current use of insulin ([place])   [place], Angela Adam, MD   7 months ago QT prolongation   [facility] Leone Haven, MD   1 year ago Chronic systolic CHF (congestive heart failure) [facility])   [place] Leone Haven, MD   2 years ago [place] toe pain, left   [facility] [place], [place], [place]   2 years ago Hot flashes   [place], [place], [place]      Future Appointments            In 2 months Caryl Bis, Angela Adam, MD [facility], [facility]           Signed Prescriptions Disp Refills   metFORMIN (GLUCOPHAGE) 500 MG tablet 60 tablet 3    Sig: Take 1 tablet (500 mg total) by mouth 2 (two) times daily with a meal.     Endocrinology:  Diabetes - Biguanides Passed - 07/21/2019  9:21 AM      Passed - Cr in normal range and within 360 days    Creat  Date Value Ref Range Status  10/16/2016 1.12 (H) 0.50 - 0.99 mg/dL Final    Comment:      For patients > or = 69 years of age: The upper reference limit for Creatinine is approximately 13% higher for people identified as African-American.      Creatinine, Ser  Date Value Ref Range Status  07/10/2019 0.96 0.40 -  1.20 mg/dL Final         Passed - HBA1C is between 0 and 7.9 and within 180 days    Hgb A1c MFr Bld  Date Value Ref Range Status  07/10/2019 6.8 (H) 4.6 - 6.5 % Final    Comment:    Glycemic Control Guidelines for People with Diabetes:Non Diabetic:  <6%Goal of Therapy: <7%Additional Action Suggested:  >8%          Passed - eGFR in normal range and within 360 days    GFR calc Af Amer  Date Value Ref Range Status  08/27/2018 50 (L) >60 mL/min Final    Comment:    (NOTE) The eGFR has been calculated using the CKD EPI equation. This calculation has not been validated in all clinical situations. eGFR's persistently <60 mL/min signify possible Chronic Kidney Disease.    GFR calc non Af Amer  Date Value Ref Range Status  08/27/2018 43 (L) >60 mL/min Final   GFR  Date Value Ref Range Status  07/10/2019 69.69 >60.00 mL/min Final         Passed - Valid encounter within last 6  months    Recent Outpatient Visits          1 week ago Type 2 diabetes mellitus without complication, without long-term current use of insulin [facility])   [place], Angela Adam, MD   7 months ago QT prolongation   [facility] Leone Haven, MD   1 year ago Chronic systolic CHF (congestive heart failure) [facility])   [place] Leone Haven, MD   2 years ago [place] toe pain, left   [facility] [place], [place], [place]   2 years ago Hot flashes   [place], [place], DO      Future Appointments            In 2 months Caryl Bis, Angela Adam, MD [facility], [facility]

## 2019-07-31 ENCOUNTER — Other Ambulatory Visit (INDEPENDENT_AMBULATORY_CARE_PROVIDER_SITE_OTHER): Payer: Medicare Other

## 2019-07-31 ENCOUNTER — Other Ambulatory Visit: Payer: Self-pay

## 2019-07-31 DIAGNOSIS — R748 Abnormal levels of other serum enzymes: Secondary | ICD-10-CM

## 2019-07-31 NOTE — Telephone Encounter (Signed)
Please find out what she takes the zofran for. Thanks.

## 2019-07-31 NOTE — Addendum Note (Signed)
Addended by: Leeanne Rio on: 07/31/2019 10:55 AM   Modules accepted: Orders

## 2019-08-03 NOTE — Telephone Encounter (Signed)
LMTCB to ask why she takes zofran.

## 2019-08-04 LAB — ALKALINE PHOSPHATASE, ISOENZYMES
Alkaline Phosphatase: 161 IU/L — ABNORMAL HIGH (ref 39–117)
BONE FRACTION: 17 % (ref 14–68)
INTESTINAL FRAC.: 4 % (ref 0–18)
LIVER FRACTION: 79 % (ref 18–85)

## 2019-08-06 ENCOUNTER — Encounter: Payer: Self-pay | Admitting: Family Medicine

## 2019-08-06 ENCOUNTER — Other Ambulatory Visit: Payer: Self-pay | Admitting: Family Medicine

## 2019-08-06 DIAGNOSIS — R748 Abnormal levels of other serum enzymes: Secondary | ICD-10-CM

## 2019-08-10 ENCOUNTER — Telehealth: Payer: Self-pay | Admitting: Family Medicine

## 2019-08-10 NOTE — Telephone Encounter (Signed)
Copied from [place] [id number]. Topic: General - Other >> Aug 10, 2019 11:47 AM Keene Breath wrote: Reason for CRM: Patient is calling to inform the doctor or nurse that she still has not heard from the kidney specialist for her appt.  Please call to discuss.  CB# [phone]

## 2019-08-11 NOTE — Telephone Encounter (Signed)
Patient is calling to inform the doctor or nurse that she still has not heard from the kidney specialist for her appt.  Please call to discuss.  CB# [phone]

## 2019-08-14 NOTE — Telephone Encounter (Signed)
I do not have a referral for her for a nephrologist. She has been scheduled for GI. Does she need a nephrology referral?

## 2019-08-16 NOTE — Telephone Encounter (Signed)
She has been referred to GI.  She does not need to see a nephrologist at this time.

## 2019-08-19 DIAGNOSIS — Z23 Encounter for immunization: Secondary | ICD-10-CM | POA: Diagnosis not present

## 2019-08-26 ENCOUNTER — Ambulatory Visit
Admission: RE | Admit: 2019-08-26 | Discharge: 2019-08-26 | Disposition: A | Discharge status: Home / Self Care | Payer: Medicare Other | Source: Ambulatory Visit | Attending: Family Medicine | Admitting: Family Medicine

## 2019-08-26 DIAGNOSIS — Z780 Asymptomatic menopausal state: Secondary | ICD-10-CM | POA: Insufficient documentation

## 2019-08-26 DIAGNOSIS — M8589 Other specified disorders of bone density and structure, multiple sites: Secondary | ICD-10-CM | POA: Diagnosis not present

## 2019-09-11 DIAGNOSIS — H3581 Retinal edema: Secondary | ICD-10-CM | POA: Diagnosis not present

## 2019-09-11 DIAGNOSIS — E119 Type 2 diabetes mellitus without complications: Secondary | ICD-10-CM | POA: Diagnosis not present

## 2019-09-11 DIAGNOSIS — H401132 Primary open-angle glaucoma, bilateral, moderate stage: Secondary | ICD-10-CM | POA: Diagnosis not present

## 2019-09-11 DIAGNOSIS — Z961 Presence of intraocular lens: Secondary | ICD-10-CM | POA: Diagnosis not present

## 2019-09-29 ENCOUNTER — Other Ambulatory Visit: Payer: Self-pay | Admitting: Gastroenterology

## 2019-09-29 ENCOUNTER — Ambulatory Visit (INDEPENDENT_AMBULATORY_CARE_PROVIDER_SITE_OTHER): Payer: Medicare Other | Admitting: Gastroenterology

## 2019-09-29 ENCOUNTER — Other Ambulatory Visit: Payer: Self-pay

## 2019-09-29 ENCOUNTER — Encounter: Payer: Self-pay | Admitting: Gastroenterology

## 2019-09-29 VITALS — BP 105/68 | HR 88 | Temp 97.4°F | Ht 63.0 in | Wt 174.8 lb

## 2019-09-29 DIAGNOSIS — R748 Abnormal levels of other serum enzymes: Secondary | ICD-10-CM

## 2019-09-29 DIAGNOSIS — I2510 Atherosclerotic heart disease of native coronary artery without angina pectoris: Secondary | ICD-10-CM

## 2019-09-29 NOTE — Progress Notes (Signed)
Gastroenterology Consultation  Referring Provider:     Leone Haven, MD Primary Care Physician:  Leone Haven, MD Primary Gastroenterologist:  Dr. Allen Norris     Reason for Consultation:     Elevated alkaline phosphatase        HPI:   Melanie Ray is a 68 y.o. y/o female referred for consultation & management of elevated alkaline phosphatase by Dr. Caryl Bis, Angela Adam, MD.  This patient comes in with an elevated alkaline phosphatase for some time.  It appears the patient's alkaline phosphatase has been high except for 1 occasion since 2018.  The patient denies any alcohol abuse.  She also denies having any work-up for the increased alkaline phosphatase.  The patient is on multiple medications so which can cause increased alkaline phosphatase.  There is no report of any abdominal pain unexplained weight loss fevers chills nausea or vomiting.  The patient had a colonoscopy in October 2018 and states that it was stopped because of a poor prep.  When she called her insurance to get another colonoscopy she was told that she would have to wait 10 years.  Past Medical History:  Diagnosis Date  . Anemia   . Anxiety   . Back pain   . CAD (coronary artery disease) 04/11/2016   S/p ant STEMI 5/17: LHC >> LAD proximal 80%, mid 80%, distal 50%, ostial D1 60%; LCx with LPDA lesion 30%; RCA Mild calcification with no significant stenosis in a medium caliber, nondominant RCA; LVEF is estimated at 45% with inferoapical and lateral wall akinesis >> PCI: PCI: 3.5 x 24 mm Promus DES to prox LAD, 2.5 x 12 mm Promus DES to mid LAD.   [name] Chest pain   . Chronic systolic CHF (congestive heart failure) ([place]) 03/21/2016   Echo 01/30/17: Diff HK, mild focal basal septal hypertrophy, EF 30-35, mild AI, MAC, mild MR // Echo 06/08/16: Mild focal basal septal hypertrophy, EF 25-30%, diff HK, ant-septal AK, Gr 1 DD, mild AI, MAC, mild MR, PASP 37 mmHg // Echo 03/18/16: EF 30-35%, ant-septal AK, Gr 1 DD, mild MR, severe  LAE.  [name] Constipation   . Depression   . Diabetes mellitus type 2 in obese ([place])   . Dizziness   . Glaucoma   . Gout   . Heart disease   . Heartburn   . History of acute anterior wall MI 03/17/2016  . History of heart attack   . Hyperlipemia   . Hypertension   . Ischemic cardiomyopathy 10/02/2016   Refused ICD  . Joint pain   . Left bundle branch block   . Myocardial infarction ([place])   . Nausea   . Sleep apnea   . SOB (shortness of breath)   . Suicidal ideation 08/27/2018  . Swelling    feet or legs    Past Surgical History:  Procedure Laterality Date  . APPENDECTOMY    . CARDIAC CATHETERIZATION N/A 03/17/2016   Procedure: Left Heart Cath and Coronary Angiography;  Surgeon: Sherren Mocha, MD;  Location: [place] [facility];  Service: Cardiovascular;  Laterality: N/A;  . CARDIAC CATHETERIZATION N/A 03/17/2016   Procedure: Coronary Stent Intervention;  Surgeon: Sherren Mocha, MD;  Location: [place] [facility];  Service: Cardiovascular;  Laterality: N/A;  . COLONOSCOPY WITH PROPOFOL N/A 08/13/2017   Procedure: COLONOSCOPY WITH PROPOFOL;  Surgeon: Jonathon Bellows, MD;  Location: [facility];  Service: Gastroenterology;  Laterality: N/A;  . SMALL BOWEL REPAIR    . TONSILLECTOMY    .  UTERINE FIBROID SURGERY      Prior to Admission medications   Medication Sig Start Date End Date Taking? Authorizing Provider  aspirin EC 81 MG EC tablet Take 1 tablet (81 mg total) by mouth daily. 03/21/16   Lyda Jester M, PA-C  atorvastatin (LIPITOR) 80 MG tablet Take 1 tablet (80 mg total) by mouth daily. 12/30/18 12/30/19  Richardson Dopp T, PA-C  carvedilol (COREG) 12.5 MG tablet Take 1 tablet (12.5 mg total) by mouth 2 (two) times daily with a meal. 12/30/18 12/30/19  Richardson Dopp T, PA-C  dorzolamide-timolol (COSOPT) 22.3-6.8 MG/ML ophthalmic solution Place 1 drop into both eyes 2 (two) times daily.  01/17/15   [provider]  ezetimibe (ZETIA) 10 MG tablet Take 1 tablet (10 mg total)  by mouth daily. 12/30/18 12/25/19  Richardson Dopp T, PA-C  furosemide (LASIX) 40 MG tablet Take 1 tablet (40 mg total) by mouth as directed. 40 mg on Mon, Wed and Fri's; all other days 20 mg 12/30/18 12/25/19  Richardson Dopp T, PA-C  hydrOXYzine (ATARAX/VISTARIL) 50 MG tablet Take 50 mg by mouth every 6 (six) hours as needed. for anxiety 02/20/19   [provider]  isosorbide mononitrate (IMDUR) 30 MG 24 hr tablet Take 1 tablet (30 mg total) by mouth daily. 12/30/18 12/30/19  Richardson Dopp T, PA-C  LORazepam (ATIVAN) 0.5 MG tablet Take 1 tablet (0.5 mg total) by mouth as directed. Take it 2-3  times a week only for severe anxiety attacks as needed 03/06/19   Ursula Alert, MD  lurasidone (LATUDA) 20 MG TABS tablet Take 20 mg by mouth daily.    [provider]  metFORMIN (GLUCOPHAGE) 500 MG tablet Take 1 tablet (500 mg total) by mouth 2 (two) times daily with a meal. 07/21/19   Leone Haven, MD  nitroGLYCERIN (NITROSTAT) 0.4 MG SL tablet Place 1 tablet (0.4 mg total) under the tongue every 5 (five) minutes x 3 doses as needed for chest pain (Do not exceed 3 doses). 12/30/18   Richardson Dopp T, PA-C  OLANZapine (ZYPREXA) 5 MG tablet Take 1 tablet (5 mg total) by mouth at bedtime. For anxiety and sleep 01/05/19   Ursula Alert, MD  olmesartan (BENICAR) 20 MG tablet Take 1 tablet (20 mg total) by mouth daily. 12/30/18 12/25/19  Richardson Dopp T, PA-C  ondansetron (ZOFRAN) 4 MG tablet TAKE 1 TABLET BY MOUTH ONCE DAILY AS NEEDED FOR NAUSEA AND VOMITING 05/29/18   Leone Haven, MD  spironolactone (ALDACTONE) 25 MG tablet TAKE 1/2 (ONE-HALF) TABLET BY MOUTH ONCE DAILY 12/30/18   Richardson Dopp T, PA-C  traZODone (DESYREL) 100 MG tablet Take 100 mg by mouth at bedtime as needed for sleep.    [provider]  venlafaxine XR (EFFEXOR-XR) 150 MG 24 hr capsule TAKE 2 CAPSULES BY MOUTH ONCE DAILY (NEEDS  APPOINTMENT  FOR  FURTHER  REFILLS) 03/13/19   Ursula Alert, MD    Family History   Problem Relation Age of Onset  . Anxiety disorder Mother   . Paranoid behavior Mother   . Hypertension Mother   . Heart failure Mother   . Stroke Mother   . Dementia Mother   . High Cholesterol Mother   . Diabetes Mother   . Hyperlipidemia Mother   . Heart disease Mother   . Depression Mother   . Hypertension Father   . High Cholesterol Father   . Mood Disorder Sister   . Stroke Sister   . Anxiety disorder Maternal Aunt   .  Drug abuse Cousin   . Tuberculosis Paternal Grandfather      Social History   Tobacco Use  . Smoking status: Former Smoker    Types: Cigarettes    Quit date: 04/13/1997    Years since quitting: 22.4  . Smokeless tobacco: Never Used  Substance Use Topics  . Alcohol use: Yes    Alcohol/week: 1.0 standard drinks    Types: 1 Glasses of wine per week    Comment: 2-3 glasses of wine per month  . Drug use: No    Allergies as of 09/29/2019 - Review Complete 09/29/2019  Allergen Reaction Noted  . Tetracycline Swelling 04/27/2008  . Meperidine Nausea And Vomiting 04/20/2009    Review of Systems:    All systems reviewed and negative except where noted in HPI.   Physical Exam:  BP 105/68   Pulse 88   Temp (!) 97.4 F (36.3 C) (Oral)   Ht _0  (1.6 m)   Wt 174 lb 12.8 oz (79.3 kg)   BMI 30.96 kg/m  No LMP recorded. Patient is postmenopausal. General:   Alert,  Well-developed, well-nourished, pleasant and cooperative in NAD Head:  Normocephalic and atraumatic. Eyes:  Sclera clear, no icterus.   Conjunctiva pink. Ears:  Normal auditory acuity. Neck:  Supple; no masses or thyromegaly. Lungs:  Respirations even and unlabored.  Clear throughout to auscultation.   No wheezes, crackles, or rhonchi. No acute distress. Heart:  Regular rate and rhythm; no murmurs, clicks, rubs, or gallops. Abdomen:  Normal bowel sounds.  No bruits.  Soft, non-tender and non-distended without masses, hepatosplenomegaly or hernias noted.  No guarding or rebound tenderness.   Negative Carnett sign.   Rectal:  Deferred.  Msk:  Symmetrical without gross deformities.  Good, equal movement & strength bilaterally. Pulses:  Normal pulses noted. Extremities:  No clubbing or edema.  No cyanosis. Neurologic:  Alert and oriented x3;  grossly normal neurologically. Skin:  Intact without significant lesions or rashes.  No jaundice. Lymph Nodes:  No significant cervical adenopathy. Psych:  Alert and cooperative. Normal mood and affect.  Imaging Studies: No results found.  Assessment and Plan:   Dwight Burdo is a 68 y.o. y/o female who comes in today with an isolated alkaline phosphatase.  The algorithm for increased alkaline phosphatase is attached below.  The patient will be set up for a right upper quadrant ultrasound and have her AMA checked.  The patient has also been told that since she is concerned about getting a bill for a repeat colonoscopy then we will send her off for a Cologuard test and if that is positive she will then consider whether she will proceed with a repeat colonoscopy.  The patient has been explained the plan and agrees with it.     Lucilla Lame, MD. Mar[facility] Regal    Note: This dictation was prepared with Dragon dictation along with smaller phrase technology. Any transcriptional errors that result from this process are unintentional.

## 2019-09-30 LAB — HEPATIC FUNCTION PANEL
ALT: 13 IU/L (ref 0–32)
AST: 18 IU/L (ref 0–40)
Albumin: 4.8 g/dL (ref 3.8–4.8)
Alkaline Phosphatase: 173 IU/L — ABNORMAL HIGH (ref 39–117)
Bilirubin Total: 0.5 mg/dL (ref 0.0–1.2)
Bilirubin, Direct: 0.12 mg/dL (ref 0.00–0.40)
Total Protein: 7.7 g/dL (ref 6.0–8.5)

## 2019-09-30 LAB — MITOCHONDRIAL ANTIBODIES: Mitochondrial Ab: <20 Units (ref 0.0–20.0)

## 2019-10-06 ENCOUNTER — Telehealth: Payer: Self-pay

## 2019-10-06 NOTE — Telephone Encounter (Signed)
Pt notified of lab results

## 2019-10-06 NOTE — Telephone Encounter (Signed)
-----   Message from Lucilla Lame, MD sent at 10/05/2019 12:15 PM EST ----- Let the patient know that her alkaline phosphatase is still elevated and we will wait for the ultrasound before we make any further recommendations.

## 2019-10-07 ENCOUNTER — Ambulatory Visit: Payer: Medicare Other

## 2019-10-07 DIAGNOSIS — Z79899 Other long term (current) drug therapy: Secondary | ICD-10-CM | POA: Diagnosis not present

## 2019-10-07 DIAGNOSIS — F422 Mixed obsessional thoughts and acts: Secondary | ICD-10-CM | POA: Diagnosis not present

## 2019-10-08 ENCOUNTER — Other Ambulatory Visit: Payer: Medicare Other

## 2019-10-08 ENCOUNTER — Ambulatory Visit: Payer: Medicare Other

## 2019-10-08 ENCOUNTER — Other Ambulatory Visit (HOSPITAL_COMMUNITY): Payer: Medicare Other

## 2019-10-15 ENCOUNTER — Other Ambulatory Visit: Payer: Self-pay

## 2019-10-15 ENCOUNTER — Ambulatory Visit
Admission: RE | Admit: 2019-10-15 | Discharge: 2019-10-15 | Disposition: A | Discharge status: Home / Self Care | Payer: Medicare Other | Source: Ambulatory Visit | Attending: Gastroenterology | Admitting: Gastroenterology

## 2019-10-15 DIAGNOSIS — R748 Abnormal levels of other serum enzymes: Secondary | ICD-10-CM

## 2019-10-15 IMAGING — US US ABDOMEN LIMITED
1 series · 14 of 25 positions shown · non-contrast
Comparison: None.

CLINICAL DATA: Elevated alkaline phosphatase level.

EXAM:
ULTRASOUND ABDOMEN LIMITED RIGHT UPPER QUADRANT

[Series 1: us abdomen limited · 14 of 68 slices shown]
[im 1/68]
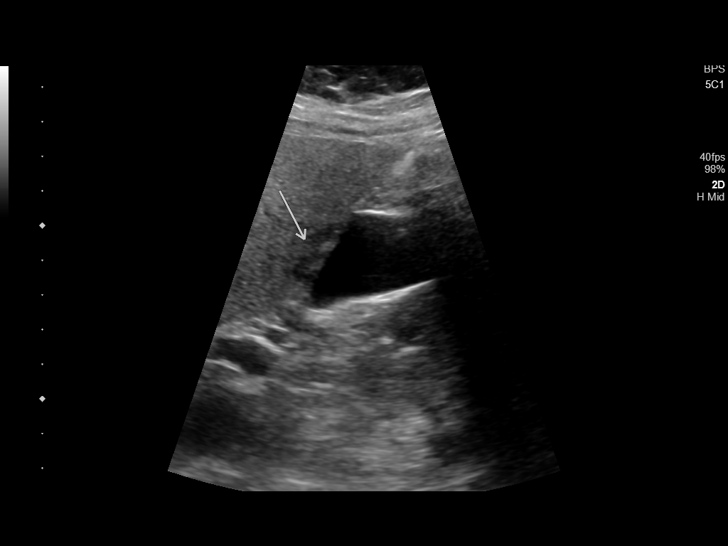
[im 6/68]
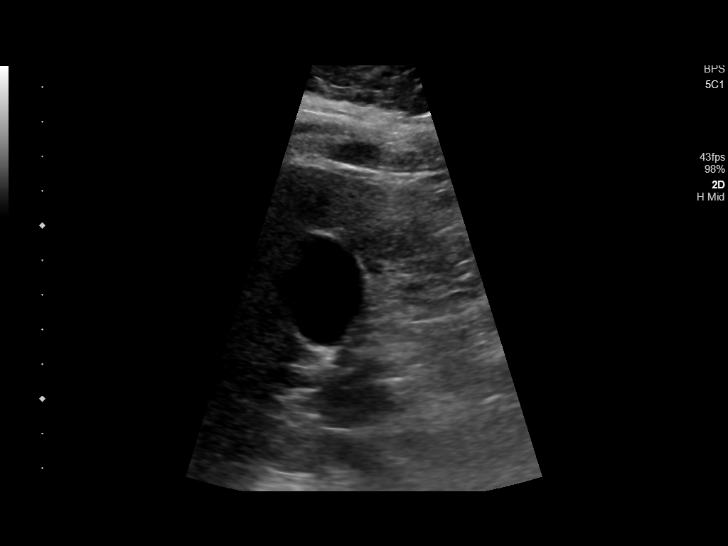
[im 12/68]
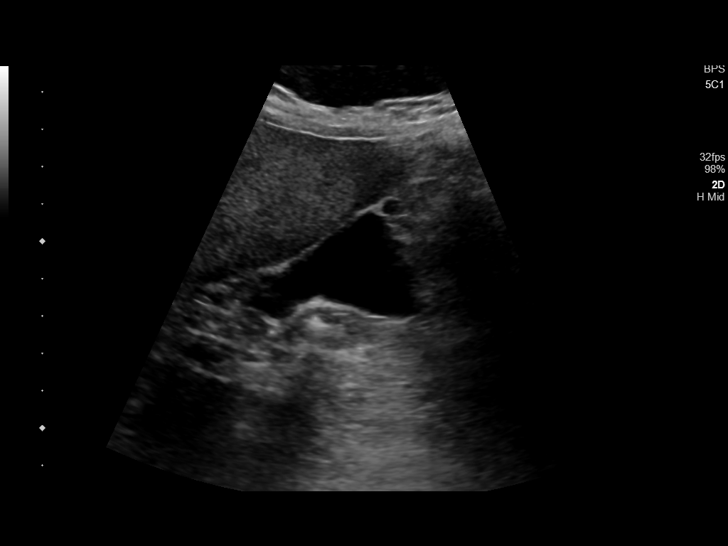
[im 17/68]
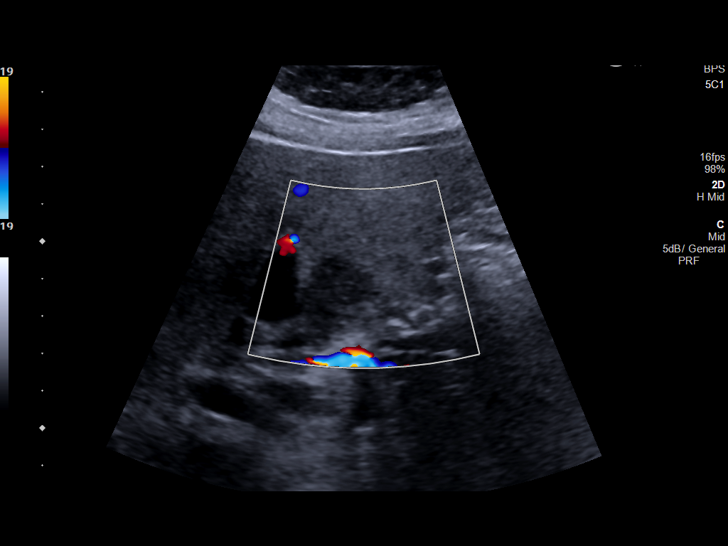
[im 23/68]
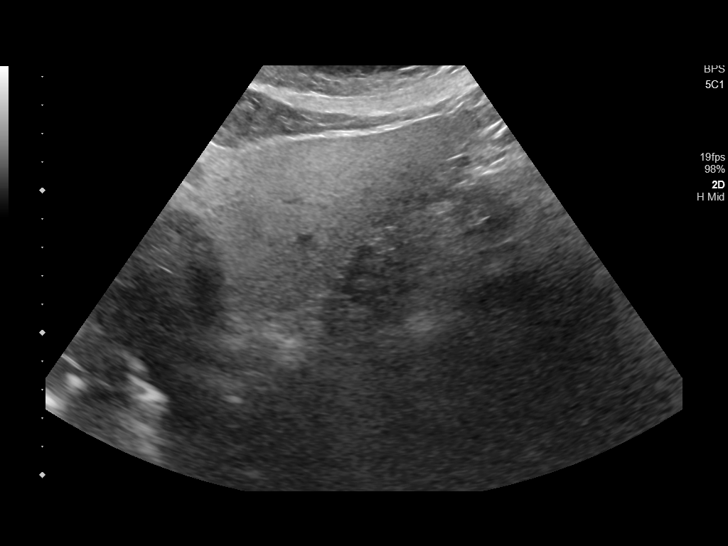
[im 26/68]
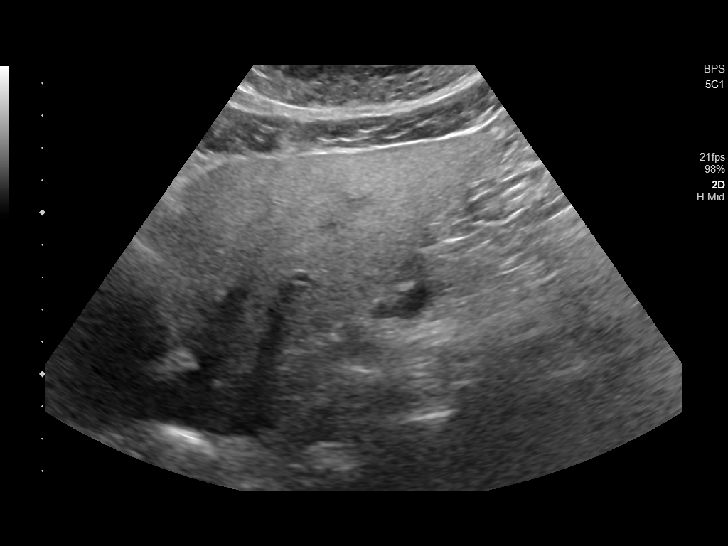
[im 31/68]
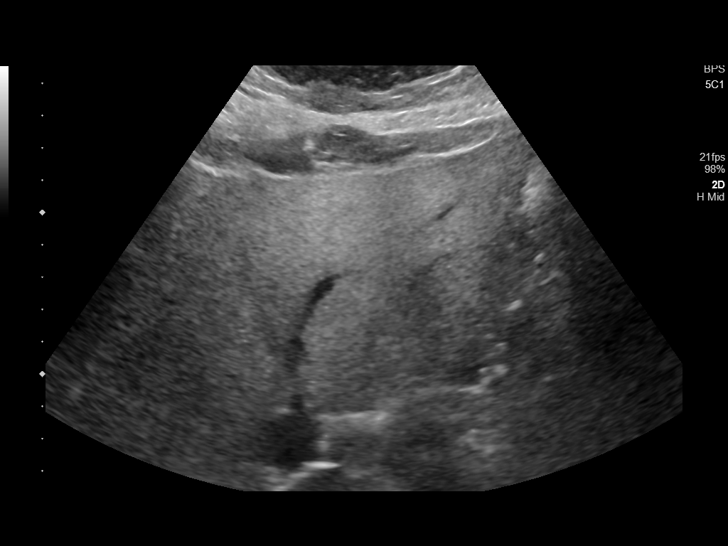
[im 37/68]
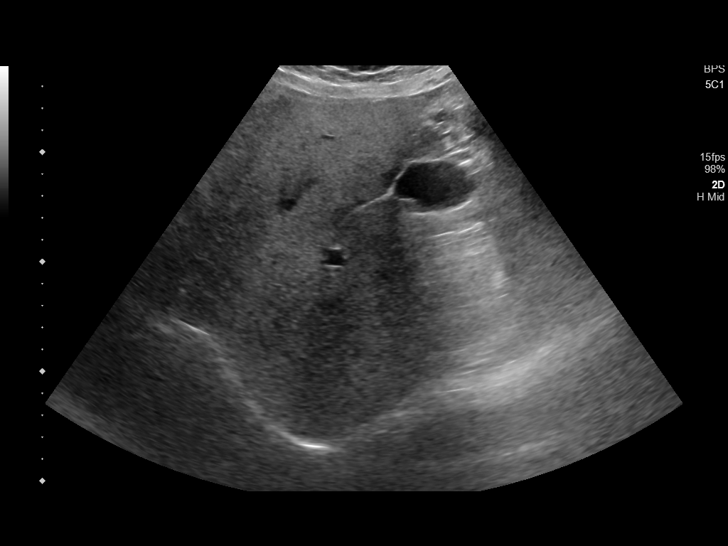
[im 42/68]
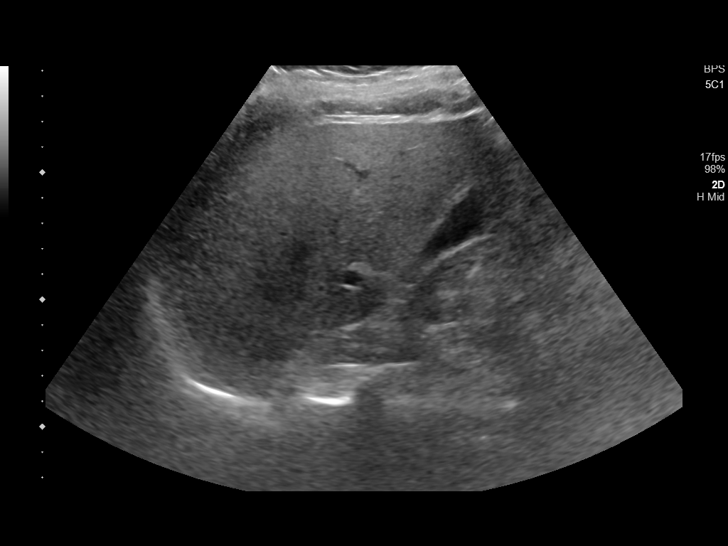
[im 45/68]
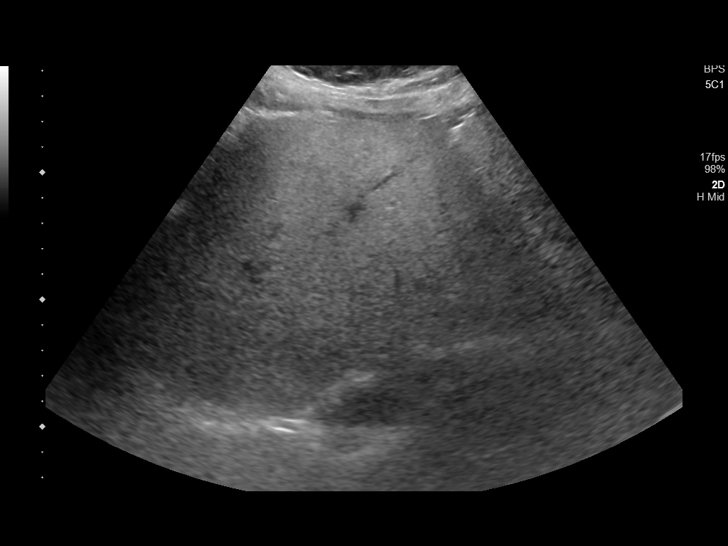
[im 51/68]
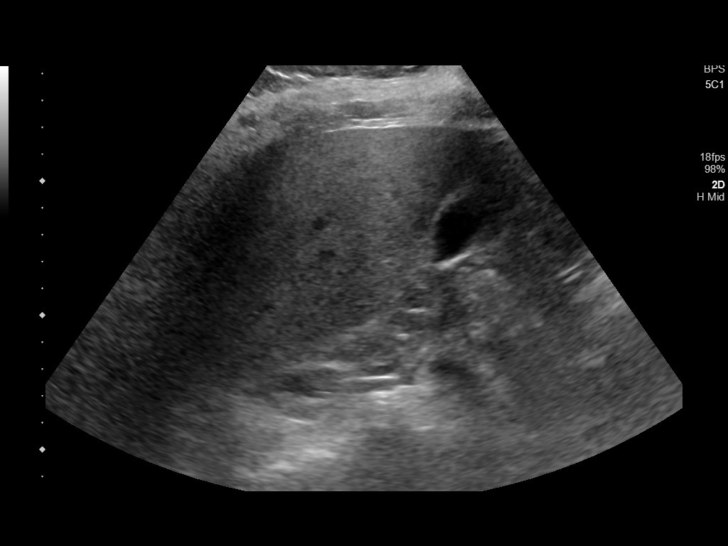
[im 56/68]
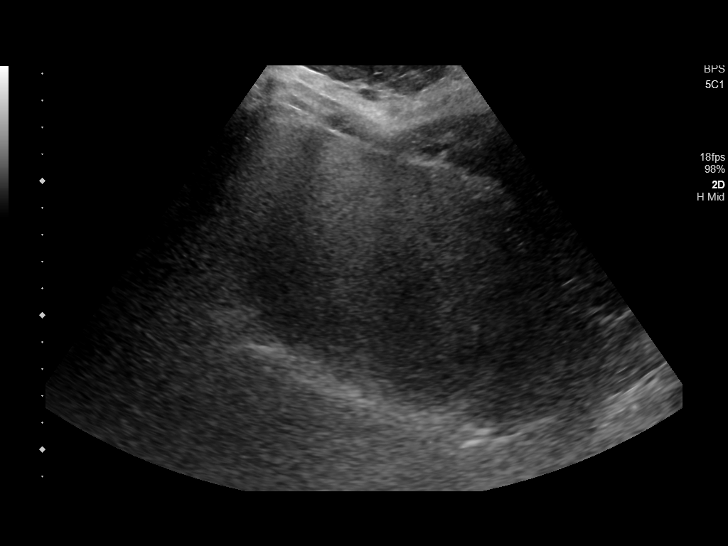
[im 62/68]
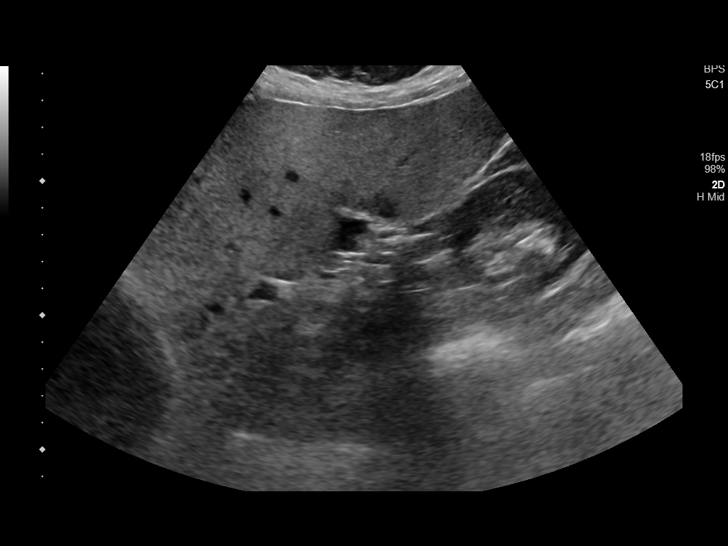
[im 68/68]
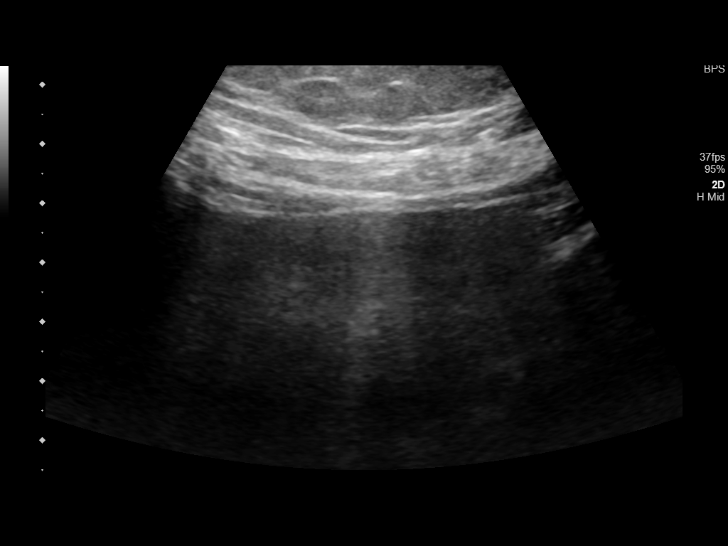

[14 of 25 positions shown; findings below may reference images not displayed]

FINDINGS: Gallbladder:

No gallstones or wall thickening visualized. No sonographic Murphy
sign noted by sonographer.

Common bile duct:

Diameter: 3 mm which is within normal limits.

Liver:

Increased echogenicity of hepatic parenchyma is noted suggesting
hepatic steatosis with probable sparing adjacent to gallbladder
fossa. Portal vein is patent on color Doppler imaging with normal
direction of blood flow towards the liver.

Other: None.
IMPRESSION: Increased echogenicity of hepatic parenchyma is noted suggesting
hepatic steatosis with probable sparing adjacent to gallbladder
fossa.

## 2019-10-16 ENCOUNTER — Ambulatory Visit (INDEPENDENT_AMBULATORY_CARE_PROVIDER_SITE_OTHER): Payer: Medicare Other | Admitting: Family Medicine

## 2019-10-16 ENCOUNTER — Encounter: Payer: Self-pay | Admitting: Family Medicine

## 2019-10-16 DIAGNOSIS — D509 Iron deficiency anemia, unspecified: Secondary | ICD-10-CM

## 2019-10-16 DIAGNOSIS — I5022 Chronic systolic (congestive) heart failure: Secondary | ICD-10-CM | POA: Diagnosis not present

## 2019-10-16 DIAGNOSIS — E119 Type 2 diabetes mellitus without complications: Secondary | ICD-10-CM | POA: Diagnosis not present

## 2019-10-16 DIAGNOSIS — I10 Essential (primary) hypertension: Secondary | ICD-10-CM

## 2019-10-16 MED ORDER — BLOOD GLUCOSE MONITOR KIT: PACK | 0 refills | Status: DC

## 2019-10-16 NOTE — Assessment & Plan Note (Signed)
Minimal edema at times.  She continues Lasix and her other medications.  Discussed daily weights and if her weight goes up 3 or more pounds overnight or 5 more pounds in a week she needs to contact us or cardiology.

## 2019-10-16 NOTE — Progress Notes (Signed)
Virtual Visit via telephone Note  This visit type was conducted due to national recommendations for restrictions regarding the COVID-19 pandemic (e.g. social distancing).  This format is felt to be most appropriate for this patient at this time.  All issues noted in this document were discussed and addressed.  No physical exam was performed (except for noted visual exam findings with Video Visits).   I connected with Mahlon Gammon today at  1:45 PM EST by telephone and verified that I am speaking with the correct person using two identifiers. Location patient: home Location provider: home office Persons participating in the virtual visit: patient, provider, Samyra Limb (husband)   I discussed the limitations, risks, security and privacy concerns of performing an evaluation and management service by telephone and the availability of in person appointments. I also discussed with the patient that there may be a patient responsible charge related to this service. The patient expressed understanding and agreed to proceed.  Interactive audio and video telecommunications were attempted between this provider and patient, however failed, due to patient having technical difficulties OR patient did not have access to video capability.  We continued and completed visit with audio only.   Reason for visit: follow-up  HPI: Diabetes: Not checking sugars.  She is taking Metformin.  Some intermittent polydipsia and polyuria.  No hypoglycemia.  She did see ophthalmology in November.  Hypertension: Not checking at home though it was 105/68 at GI.  Taking carvedilol, losartan, spironolactone, and Lasix.  No chest pain or shortness of breath.  She has chronic intermittent edema that typically resolves overnight.  CHF: She does not weigh herself daily.  No orthopnea or PND.  Has chronic pedal edema that occurs occasionally and resolves overnight.  History of iron deficiency anemia: Most recent iron  studies were normal.  She reports no blood in her stool.  Fatty liver: Had elevated alkaline phosphatase.  Underwent evaluation with GI.  Ultrasound revealed fatty liver.  She does walk some for exercise though not consistently.  Diet consists of lots of fried foods and nonlean meats.   ROS: See pertinent positives and negatives per HPI.  Past Medical History:  Diagnosis Date  . Anemia   . Anxiety   . Back pain   . CAD (coronary artery disease) 04/11/2016   S/p ant STEMI 5/17: LHC >> LAD proximal 80%, mid 80%, distal 50%, ostial D1 60%; LCx with LPDA lesion 30%; RCA Mild calcification with no significant stenosis in a medium caliber, nondominant RCA; LVEF is estimated at 45% with inferoapical and lateral wall akinesis >> PCI: PCI: 3.5 x 24 mm Promus DES to prox LAD, 2.5 x 12 mm Promus DES to mid LAD.   [name] Chest pain   . Chronic systolic CHF (congestive heart failure) ([place]) 03/21/2016   Echo 01/30/17: Diff HK, mild focal basal septal hypertrophy, EF 30-35, mild AI, MAC, mild MR // Echo 06/08/16: Mild focal basal septal hypertrophy, EF 25-30%, diff HK, ant-septal AK, Gr 1 DD, mild AI, MAC, mild MR, PASP 37 mmHg // Echo 03/18/16: EF 30-35%, ant-septal AK, Gr 1 DD, mild MR, severe LAE.  [name] Constipation   . Depression   . Diabetes mellitus type 2 in obese ([place])   . Dizziness   . Glaucoma   . Gout   . Heart disease   . Heartburn   . History of acute anterior wall MI 03/17/2016  . History of heart attack   . Hyperlipemia   . Hypertension   .  Ischemic cardiomyopathy 10/02/2016   Refused ICD  . Joint pain   . Left bundle branch block   . Myocardial infarction ([place])   . Nausea   . Sleep apnea   . SOB (shortness of breath)   . Suicidal ideation 08/27/2018  . Swelling    feet or legs    Past Surgical History:  Procedure Laterality Date  . APPENDECTOMY    . CARDIAC CATHETERIZATION N/A 03/17/2016   Procedure: Left Heart Cath and Coronary Angiography;  Surgeon: Sherren Mocha, MD;  Location: [facility];  Service: Cardiovascular;  Laterality: N/A;  . CARDIAC CATHETERIZATION N/A 03/17/2016   Procedure: Coronary Stent Intervention;  Surgeon: Sherren Mocha, MD;  Location: [facility];  Service: Cardiovascular;  Laterality: N/A;  . COLONOSCOPY WITH PROPOFOL N/A 08/13/2017   Procedure: COLONOSCOPY WITH PROPOFOL;  Surgeon: Jonathon Bellows, MD;  Location: [facility];  Service: Gastroenterology;  Laterality: N/A;  . SMALL BOWEL REPAIR    . TONSILLECTOMY    . UTERINE FIBROID SURGERY      Family History  Problem Relation Age of Onset  . Anxiety disorder Mother   . Paranoid behavior Mother   . Hypertension Mother   . Heart failure Mother   . Stroke Mother   . Dementia Mother   . High Cholesterol Mother   . Diabetes Mother   . Hyperlipidemia Mother   . Heart disease Mother   . Depression Mother   . Hypertension Father   . High Cholesterol Father   . Mood Disorder Sister   . Stroke Sister   . Anxiety disorder Maternal Aunt   . Drug abuse Cousin   . Tuberculosis Paternal Grandfather     SOCIAL HX: Former smoker   Current Outpatient Medications:  .  aspirin EC 81 MG EC tablet, Take 1 tablet (81 mg total) by mouth daily., Disp: , Rfl:  .  atorvastatin (LIPITOR) 80 MG tablet, Take 1 tablet (80 mg total) by mouth daily., Disp: 90 tablet, Rfl: 3 .  Biotin 10 MG TABS, , Disp: , Rfl:  .  carvedilol (COREG) 12.5 MG tablet, Take 1 tablet (12.5 mg total) by mouth 2 (two) times daily with a meal., Disp: 180 tablet, Rfl: 3 .  dorzolamide-timolol (COSOPT) 22.3-6.8 MG/ML ophthalmic solution, Place 1 drop into both eyes 2 (two) times daily. , Disp: , Rfl:  .  ezetimibe (ZETIA) 10 MG tablet, Take 1 tablet (10 mg total) by mouth daily., Disp: 90 tablet, Rfl: 3 .  furosemide (LASIX) 40 MG tablet, Take 1 tablet (40 mg total) by mouth as directed. 40 mg on Mon, Wed and Fri's; all other days 20 mg, Disp: 65 tablet, Rfl: 3 .  hydrOXYzine (ATARAX/VISTARIL) 50 MG tablet, Take 50 mg by  mouth every 6 (six) hours as needed. for anxiety, Disp: , Rfl:  .  isosorbide mononitrate (IMDUR) 30 MG 24 hr tablet, Take 1 tablet (30 mg total) by mouth daily., Disp: 90 tablet, Rfl: 3 .  LATUDA 40 MG TABS tablet, , Disp: , Rfl:  .  LORazepam (ATIVAN) 0.5 MG tablet, Take 1 tablet (0.5 mg total) by mouth as directed. Take it 2-3  times a week only for severe anxiety attacks as needed, Disp: 28 tablet, Rfl: 1 .  metFORMIN (GLUCOPHAGE) 500 MG tablet, Take 1 tablet (500 mg total) by mouth 2 (two) times daily with a meal., Disp: 60 tablet, Rfl: 3 .  nitroGLYCERIN (NITROSTAT) 0.4 MG SL tablet, Place 1 tablet (0.4 mg total)  under the tongue every 5 (five) minutes x 3 doses as needed for chest pain (Do not exceed 3 doses)., Disp: 25 tablet, Rfl: 11 .  OLANZapine (ZYPREXA) 5 MG tablet, Take 1 tablet (5 mg total) by mouth at bedtime. For anxiety and sleep, Disp: 30 tablet, Rfl: 1 .  olmesartan (BENICAR) 20 MG tablet, Take 1 tablet (20 mg total) by mouth daily., Disp: 90 tablet, Rfl: 3 .  ondansetron (ZOFRAN) 4 MG tablet, TAKE 1 TABLET BY MOUTH ONCE DAILY AS NEEDED FOR NAUSEA AND VOMITING, Disp: 20 tablet, Rfl: 1 .  spironolactone (ALDACTONE) 25 MG tablet, TAKE 1/2 (ONE-HALF) TABLET BY MOUTH ONCE DAILY, Disp: 45 tablet, Rfl: 3 .  traZODone (DESYREL) 100 MG tablet, Take 100 mg by mouth at bedtime as needed for sleep., Disp: , Rfl:  .  venlafaxine XR (EFFEXOR-XR) 150 MG 24 hr capsule, TAKE 2 CAPSULES BY MOUTH ONCE DAILY (NEEDS  APPOINTMENT  FOR  FURTHER  REFILLS), Disp: 60 capsule, Rfl: 0 .  blood glucose meter kit and supplies KIT, Dispense based on patient and insurance preference.  Check glucose once daily fasting in the morning. (FOR ICD 10 E11.9)., Disp: 1 each, Rfl: 0  EXAM: This was a telehealth telephone visit and thus no physical exam was completed.   ASSESSMENT AND PLAN:  Discussed the following assessment and plan:  Chronic systolic CHF (congestive heart failure) (HCC) Minimal edema at times.   She continues Lasix and her other medications.  Discussed daily weights and if her weight goes up 3 or more pounds overnight or 5 more pounds in a week she needs to contact us or cardiology.  Essential hypertension Well-controlled when she was at GI.  She will continue her current regimen.  DM type 2 (diabetes mellitus, type 2) ([place]) Well-controlled on most recent check.  She will start checking glucoses fasting in the morning.  If these are running greater than 140 consistently she will contact us.  Will defer lab work for 3 months given the COVID-19 pandemic.  Iron deficiency anemia, unspecified Recent iron studies were normal.    I discussed the assessment and treatment plan with the patient. The patient was provided an opportunity to ask questions and all were answered. The patient agreed with the plan and demonstrated an understanding of the instructions.   The patient was advised to call back or seek an in-person evaluation if the symptoms worsen or if the condition fails to improve as anticipated.  I provided 15 minutes of non-face-to-face time during this encounter.   Tommi Rumps, MD

## 2019-10-16 NOTE — Assessment & Plan Note (Signed)
Well-controlled when she was at GI.  She will continue her current regimen.

## 2019-10-16 NOTE — Assessment & Plan Note (Signed)
Recent iron studies were normal.

## 2019-10-16 NOTE — Assessment & Plan Note (Addendum)
Well-controlled on most recent check.  She will start checking glucoses fasting in the morning.  If these are running greater than 140 consistently she will contact us.  Will defer lab work for 3 months given the COVID-19 pandemic.

## 2019-10-22 ENCOUNTER — Telehealth: Payer: Self-pay

## 2019-10-22 NOTE — Telephone Encounter (Signed)
-----   Message from Lucilla Lame, MD sent at 10/17/2019  8:56 AM EST ----- Let the patient know that her ultrasound showed fatty liver but no other abnormalities.  Her alkaline phosphatase remains elevated and is at the borderline whether observation or further testing is indicated.  I would recommend repeating in 3 months and if trending up would need a liver biopsy.

## 2019-10-22 NOTE — Telephone Encounter (Signed)
Pt notified of results. Pt stated she was having repeat labs with PCP in March. Pt will have PCP fax results when available.

## 2019-10-23 ENCOUNTER — Encounter: Payer: Self-pay | Admitting: Gastroenterology

## 2019-10-26 ENCOUNTER — Telehealth: Payer: Self-pay | Admitting: Gastroenterology

## 2019-10-26 ENCOUNTER — Telehealth: Payer: Self-pay

## 2019-10-26 NOTE — Telephone Encounter (Signed)
Patient verbalized understanding. Patient states she will not take the mediation then

## 2019-10-26 NOTE — Telephone Encounter (Signed)
Pt is calling back to give the name  of the Detox  She wanted to try  Liver Detox and Repair 22 Herbal support Ddot Diatary supplement Cleansing of GI tract  And Liver. She  States it is Herbal based

## 2019-10-26 NOTE — Telephone Encounter (Signed)
-----   Message from Lucilla Lame, MD sent at 10/26/2019  3:55 PM EST ----- Let the patient know that her last colonoscopy 2 years ago was not complete due to a poor preparation and due to her Cologuard being positive she should have a repeat colonoscopy.

## 2019-10-26 NOTE — Telephone Encounter (Signed)
Patient states the reason why she did the COloguard is because her insurance would not cover a colonoscopy for 8 more years. Please advised

## 2019-10-26 NOTE — Telephone Encounter (Signed)
Let the patient know that these are herbal medications and diet supplements.  They are not FDA investigated medication so I cannot give her an opinion on whether they are good or bad.  I have no idea what the ingredients are but can tell her that I have not found any similar products to be of any benefit.

## 2019-10-26 NOTE — Telephone Encounter (Signed)
Please advised if patient can take these

## 2019-10-26 NOTE — Telephone Encounter (Signed)
Pt is calling to check with Dr. Allen Norris if she could do a Liver Detox she states her friend had done one and it helped her, she did not specify what kind of Liver Detox.

## 2019-10-26 NOTE — Telephone Encounter (Signed)
Informed patient that we would need to know the name of the Liver Detox before we could tell her if it is okay. Patient states she will find this out

## 2019-10-27 ENCOUNTER — Telehealth: Payer: Self-pay | Admitting: Family Medicine

## 2019-10-27 NOTE — Telephone Encounter (Signed)
I called th e patient and she stated the provider informed her to stay away from caffeine and she stated could she drink coffee and I informed her she could if it was decaffeinated and she understood.  Charod Slawinski,cma

## 2019-10-27 NOTE — Telephone Encounter (Signed)
Pt called wants to know if 2 decaf a day.Please call her at [phone].

## 2019-11-09 ENCOUNTER — Telehealth: Payer: Self-pay | Admitting: Gastroenterology

## 2019-11-09 NOTE — Telephone Encounter (Signed)
Pt left vm she was speaking with someone and got disconnected please call pt

## 2019-11-09 NOTE — Telephone Encounter (Signed)
Patient is calling about patient Colorguard results she states. Please advised

## 2019-12-11 DIAGNOSIS — Z23 Encounter for immunization: Secondary | ICD-10-CM | POA: Diagnosis not present

## 2020-01-01 DIAGNOSIS — Z23 Encounter for immunization: Secondary | ICD-10-CM | POA: Diagnosis not present

## 2020-01-06 ENCOUNTER — Other Ambulatory Visit: Payer: Self-pay | Admitting: Physician Assistant

## 2020-01-06 ENCOUNTER — Other Ambulatory Visit: Payer: Self-pay | Admitting: Cardiovascular Disease

## 2020-01-06 MED ORDER — ISOSORBIDE MONONITRATE ER 30 MG PO TB24: 30.0000 mg | ORAL_TABLET | Freq: Every day | ORAL | 0 refills | Status: DC

## 2020-01-06 NOTE — Telephone Encounter (Signed)
New Message   *STAT* If patient is at the pharmacy, call can be transferred to refill team.   1. Which medications need to be refilled? (please list name of each medication and dose if known) isosorbide mononitrate (IMDUR) 30 MG 24 hr tablet(Expired)  2. Which pharmacy/location (including street and city if local pharmacy) is medication to be sent to? [place], [place]  3. Do they need a 30 day or 90 day supply? 90 day

## 2020-01-06 NOTE — Telephone Encounter (Signed)
**Note De-Identified  Obfuscation** I have e-scribed the pts Isosorbide l#90/0 refills to [facility] on [address] in [place] as requested.

## 2020-01-08 NOTE — Telephone Encounter (Signed)
Called pt and left a voicemail to follow up back up on the positive cologuard test. Left her a message to return my call to discuss having a colonoscopy due to it being positive.

## 2020-01-18 ENCOUNTER — Ambulatory Visit: Payer: Medicare Other | Admitting: Cardiovascular Disease

## 2020-01-18 ENCOUNTER — Ambulatory Visit: Payer: Medicare Other | Admitting: Family Medicine

## 2020-01-21 ENCOUNTER — Ambulatory Visit: Payer: Medicare Other | Admitting: Cardiovascular Disease

## 2020-01-22 ENCOUNTER — Ambulatory Visit: Payer: Medicare Other | Admitting: Family Medicine

## 2020-01-22 DIAGNOSIS — F422 Mixed obsessional thoughts and acts: Secondary | ICD-10-CM | POA: Diagnosis not present

## 2020-01-22 DIAGNOSIS — Z79899 Other long term (current) drug therapy: Secondary | ICD-10-CM | POA: Diagnosis not present

## 2020-01-29 ENCOUNTER — Other Ambulatory Visit: Payer: Self-pay | Admitting: Physician Assistant

## 2020-01-30 ENCOUNTER — Other Ambulatory Visit: Payer: Self-pay | Admitting: Family Medicine

## 2020-02-09 ENCOUNTER — Other Ambulatory Visit: Payer: Self-pay | Admitting: Physician Assistant

## 2020-02-09 MED ORDER — OLMESARTAN MEDOXOMIL 20 MG PO TABS: 20.0000 mg | ORAL_TABLET | Freq: Every day | ORAL | 1 refills | Status: DC

## 2020-02-16 NOTE — Progress Notes (Signed)
Cardiology Office Note:    Date:  02/17/2020   ID:  Melanie Ray, DOB 12-14-1950, MRN [id number]  PCP:  Leone Haven, MD  Cardiologist:  Sherren Mocha, MD   Electrophysiologist:  None   Referring MD: Leone Haven, MD   Chief Complaint:  Follow-up (CAD, CHF)    Patient Profile:    Melanie Ray is a 69 y.o. female with:   Coronary artery disease  ? S/p Anterior STEMI 5/17 >> PCI: DES x 2 to LAD  Chronic systolic CHF ? Ischemic CM ? EF 30-35 at time of MI >> DC on [facility] >> Pt DC'd at FU visit ? Echocardiogram 8/17: EF 25-30 ? Echocardiogram 3/18: EF 30-35 ? Echocardiogram 8/19: EF 30-35 ? EP eval (J Allred) >> pt declined CRT-D ? Intol of Entresto  Obsessive Compulsive d/o  Diabetes mellitus 2  Hypertension   Hyperlipidemia   LBBB  OSA  Hepatic steatosis   Prior CV studies: Echo 06/30/18 Mild conc LVH, EF 30-35, ant-sept/inf/inf-sept HK, Gr 1 DD, mild to mod AI, MAC, mobile density on MV unchanged, mild MR, mild TR, PASP 39  Echo 01/30/17 Diff HK, mild focal basal septal hypertrophy, EF 30-35, mild AI, MAC, mild MR  Echo 06/08/16 Mild focal basal septal hypertrophy, EF 25-30%, diff HK, ant-septal AK, Gr 1 DD, mild AI, MAC, mild MR, PASP 37 mmHg  Echo 03/18/16 EF 30-35%, ant-septal AK, Gr 1 DD, mild MR, severe LAE.  LHC 03/17/16 LAD proximal 80%, mid 80%, distal 50%, ostial D1 60%  LCx with LPDA lesion, 30%  RCA Mild calcification with no significant stenosis in a medium caliber, nondominant RCA LVEF is estimated at 45% with inferoapical and lateral wall akinesis PCI: PCI: 3.5 x 24 mm Promus DES to prox LAD, 2.5 x 12 mm Promus DES to mid LAD.  History of Present Illness:    Melanie Ray returns for follow up.  She is here alone.  She is doing well.  She has not had any chest pain, shortness of breath, syncope, orthopnea.  She had a salty meal yesterday Cristy Friedlander) and feels her feet are swollen today.  She has not had to take  extra Furosemide.    Past Medical History:  Diagnosis Date  . Anemia   . Anxiety   . Back pain   . CAD (coronary artery disease) 04/11/2016   S/p ant STEMI 5/17: LHC >> LAD proximal 80%, mid 80%, distal 50%, ostial D1 60%; LCx with LPDA lesion 30%; RCA Mild calcification with no significant stenosis in a medium caliber, nondominant RCA; LVEF is estimated at 45% with inferoapical and lateral wall akinesis >> PCI: PCI: 3.5 x 24 mm Promus DES to prox LAD, 2.5 x 12 mm Promus DES to mid LAD.   [name] Chest pain   . Chronic systolic CHF (congestive heart failure) ([place]) 03/21/2016   Echo 01/30/17: Diff HK, mild focal basal septal hypertrophy, EF 30-35, mild AI, MAC, mild MR // Echo 06/08/16: Mild focal basal septal hypertrophy, EF 25-30%, diff HK, ant-septal AK, Gr 1 DD, mild AI, MAC, mild MR, PASP 37 mmHg // Echo 03/18/16: EF 30-35%, ant-septal AK, Gr 1 DD, mild MR, severe LAE.  [name] Constipation   . Depression   . Diabetes mellitus type 2 in obese ([place])   . Dizziness   . Glaucoma   . Gout   . Heart disease   . Heartburn   . History of acute anterior wall MI 03/17/2016  . History of heart attack   .  Hyperlipemia   . Hypertension   . Ischemic cardiomyopathy 10/02/2016   Refused ICD  . Joint pain   . Left bundle branch block   . Myocardial infarction ([place])   . Nausea   . Sleep apnea   . SOB (shortness of breath)   . Suicidal ideation 08/27/2018  . Swelling    feet or legs    Current Medications: Current Meds  Medication Sig  . aspirin EC 81 MG EC tablet Take 1 tablet (81 mg total) by mouth daily.  [name] atorvastatin (LIPITOR) 80 MG tablet Take 1 tablet by mouth once daily  . blood glucose meter kit and supplies KIT Dispense based on patient and insurance preference.  Check glucose once daily fasting in the morning. (FOR ICD 10 E11.9).  . carvedilol (COREG) 12.5 MG tablet Take 1 tablet (12.5 mg total) by mouth 2 (two) times daily with a meal.  . Cyanocobalamin (VITAMIN B-12 PO) Take 1 capsule by  mouth daily.  . dorzolamide-timolol (COSOPT) 22.3-6.8 MG/ML ophthalmic solution Place 1 drop into both eyes 2 (two) times daily.   . furosemide (LASIX) 40 MG tablet Take 1 tablet (40 mg total) by mouth as directed. 40 mg on Mon, Wed and Fri's; all other days 20 mg  . isosorbide mononitrate (IMDUR) 30 MG 24 hr tablet Take 1 tablet (30 mg total) by mouth daily.  [name] LATUDA 40 MG TABS tablet Take 40 mg by mouth daily with breakfast.   . metFORMIN (GLUCOPHAGE) 500 MG tablet TAKE 1 TABLET BY MOUTH TWICE DAILY WITH  A  MEAL  . Multiple Vitamin (MULTI-VITAMIN DAILY) TABS Take 1 tablet by mouth daily.  . nitroGLYCERIN (NITROSTAT) 0.4 MG SL tablet Place 1 tablet (0.4 mg total) under the tongue every 5 (five) minutes x 3 doses as needed for chest pain (Do not exceed 3 doses).  . OLANZapine (ZYPREXA) 5 MG tablet Take 1 tablet (5 mg total) by mouth at bedtime. For anxiety and sleep  . olmesartan (BENICAR) 20 MG tablet Take 20 mg by mouth daily.  . ondansetron (ZOFRAN) 4 MG tablet TAKE 1 TABLET BY MOUTH ONCE DAILY AS NEEDED FOR NAUSEA AND VOMITING  . spironolactone (ALDACTONE) 25 MG tablet TAKE 1/2 (ONE-HALF) TABLET BY MOUTH ONCE DAILY  . traZODone (DESYREL) 100 MG tablet Take 100 mg by mouth at bedtime as needed for sleep.  [name] venlafaxine XR (EFFEXOR-XR) 150 MG 24 hr capsule TAKE 2 CAPSULES BY MOUTH ONCE DAILY (NEEDS  APPOINTMENT  FOR  FURTHER  REFILLS)     Allergies:   Tetracycline and Meperidine   Social History   Tobacco Use  . Smoking status: Former Smoker    Types: Cigarettes    Quit date: 04/13/1997    Years since quitting: 22.8  . Smokeless tobacco: Never Used  Substance Use Topics  . Alcohol use: Yes    Alcohol/week: 1.0 standard drinks    Types: 1 Glasses of wine per week    Comment: 2-3 glasses of wine per month  . Drug use: No     Family Hx: The patient's family history includes Anxiety disorder in her maternal aunt and mother; Dementia in her mother; Depression in her mother; Diabetes  in her mother; Drug abuse in her cousin; Heart disease in her mother; Heart failure in her mother; High Cholesterol in her father and mother; Hyperlipidemia in her mother; Hypertension in her father and mother; Mood Disorder in her sister; Paranoid behavior in her mother; Stroke in her mother and sister; Tuberculosis  in her paternal grandfather.  ROS   EKGs/Labs/Other Test Reviewed:    EKG:  EKG is  ordered today.  The ekg ordered today demonstrates normal sinus rhythm, HR 69, LBBB  Recent Labs: 07/10/2019: BUN 17; Creatinine, Ser 0.96; Potassium 4.4; Sodium 142 09/29/2019: ALT 13   Recent Lipid Panel Lab Results  Component Value Date/Time   CHOL 154 04/07/2019 10:43 AM   TRIG 116 04/07/2019 10:43 AM   HDL 39 (L) 04/07/2019 10:43 AM   CHOLHDL 3.9 04/07/2019 10:43 AM   CHOLHDL 4 01/03/2017 09:47 AM   LDLCALC 92 04/07/2019 10:43 AM   LDLDIRECT 78.0 02/07/2018 09:28 AM    Physical Exam:    VS:  BP 136/62   Pulse 69   Ht _0  (1.6 m)   Wt 170 lb 3.2 oz (77.2 kg)   SpO2 98%   BMI 30.15 kg/m     Wt Readings from Last 3 Encounters:  02/17/20 170 lb 3.2 oz (77.2 kg)  10/16/19 174 lb (78.9 kg)  09/29/19 174 lb 12.8 oz (79.3 kg)     Constitutional:      Appearance: Healthy appearance. Not in distress.  Neck:     Thyroid: No thyromegaly.     Vascular: JVD normal.  Pulmonary:     Effort: Pulmonary effort is normal.     Breath sounds: No wheezing. No rales.  Cardiovascular:     Normal rate. Regular rhythm. Normal S1. Normal S2.     Murmurs: There is no murmur.  Edema:    Peripheral edema present.    Feet: bilateral trace edema of the feet. Abdominal:     Palpations: Abdomen is soft. There is no hepatomegaly.  Skin:    General: Skin is cool and dry.  Neurological:     General: No focal deficit present.     Mental Status: Alert and oriented to person, place and time.      ASSESSMENT & PLAN:    1. Chronic systolic CHF (congestive heart failure) (HCC) EF 30-35.   Ischemic CM. NYHA 2.  She has declined ICD in the past.  Volume status is stable.  Continue current dose of Olmesartan, Carvedilol, Spironolactone, Isosorbide.  FU in 6 mos in person with Dr. Burt Knack or me.   2. Coronary artery disease involving native coronary artery of native heart without angina pectoris Hx of anterior STEMI in 5/17 tx with DES x 2 to the LAD.  She is not having any anginal symptoms.  Continue aspirin, atorvastatin, isosorbide, carvedilol.  3. Essential hypertension Blood pressure somewhat borderline elevated today.  We will provide her with a blood pressure cuff.  I have asked her to monitor her blood pressure and notify us of her readings are >130/80.  Continue current dose of carvedilol, isosorbide, losartan, spironolactone.  4. Pure hypercholesterolemia LDL in June 2020 was 92.  Continue high intensity statin therapy with atorvastatin 80 mg daily.  Obtain follow-up c-Met, lipids in June 2021.  If her LDL remains >70, consider adding ezetimibe versus switching atorvastatin to rosuvastatin.   Dispo:  Return in about 6 months (around 08/18/2020) for Routine Follow Up, w/ Dr. Burt Knack, or Richardson Dopp, PA-C, in person.   Medication Adjustments/Labs and Tests Ordered: Current medicines are reviewed at length with the patient today.  Concerns regarding medicines are outlined above.  Tests Ordered: Orders Placed This Encounter  Procedures  . Comprehensive metabolic panel  . Lipid panel  . EKG 12-Lead   Medication Changes: No orders of the defined  types were placed in this encounter.   Signed, Richardson Dopp, PA-C  02/17/2020 12:33 PM    [facility] [place], [place], [place]  [address] Phone: [phone]; Fax: [phone]

## 2020-02-17 ENCOUNTER — Other Ambulatory Visit: Payer: Self-pay

## 2020-02-17 ENCOUNTER — Ambulatory Visit (INDEPENDENT_AMBULATORY_CARE_PROVIDER_SITE_OTHER): Payer: Medicare Other | Admitting: Physician Assistant

## 2020-02-17 ENCOUNTER — Encounter: Payer: Self-pay | Admitting: Physician Assistant

## 2020-02-17 VITALS — BP 136/62 | HR 69 | Ht 63.0 in | Wt 170.2 lb

## 2020-02-17 DIAGNOSIS — I2510 Atherosclerotic heart disease of native coronary artery without angina pectoris: Secondary | ICD-10-CM | POA: Diagnosis not present

## 2020-02-17 DIAGNOSIS — E7800 Pure hypercholesterolemia, unspecified: Secondary | ICD-10-CM | POA: Diagnosis not present

## 2020-02-17 DIAGNOSIS — I5022 Chronic systolic (congestive) heart failure: Secondary | ICD-10-CM | POA: Diagnosis not present

## 2020-02-17 DIAGNOSIS — I10 Essential (primary) hypertension: Secondary | ICD-10-CM

## 2020-02-17 NOTE — Patient Instructions (Addendum)
Medication Instructions:   Your physician recommends that you continue on your current medications as directed. Please refer to the Current Medication list given to you today.  *If you need a refill on your cardiac medications before your next appointment, please call your pharmacy*  Lab Work:  Your physician recommends that you return for lab work in June on 04/18/20 at 8:00AM  If you have labs (blood work) drawn today and your tests are completely normal, you will receive your results only by: [name] MyChart Message (if you have MyChart) OR . A paper copy in the mail If you have any lab test that is abnormal or we need to change your treatment, we will call you to review the results.  Testing/Procedures:  None ordered today  Follow-Up: At [facility], you and your health needs are our priority.  As part of our continuing mission to provide you with exceptional heart care, we have created designated Provider Care Teams.  These Care Teams include your primary Cardiologist (physician) and Advanced Practice Providers (APPs -  Physician Assistants and Nurse Practitioners) who all work together to provide you with the care you need, when you need it.  We recommend signing up for the patient portal called "MyChart".  Sign up information is provided on this After Visit Summary.  MyChart is used to connect with patients for Virtual Visits (Telemedicine).  Patients are able to view lab/test results, encounter notes, upcoming appointments, etc.  Non-urgent messages can be sent to your provider as well.   To learn more about what you can do with MyChart, go to [email or web address].    Your next appointment:   6 month(s)  The format for your next appointment:   In Person  Provider:   You may see Sherren Mocha, MD or Richardson Dopp, PA-C  Other Instructions  Check your blood pressure 3-4 times a week and call if consistently over 130/80

## 2020-02-18 ENCOUNTER — Other Ambulatory Visit: Payer: Self-pay | Admitting: Physician Assistant

## 2020-03-01 ENCOUNTER — Other Ambulatory Visit: Payer: Self-pay

## 2020-03-01 ENCOUNTER — Ambulatory Visit (INDEPENDENT_AMBULATORY_CARE_PROVIDER_SITE_OTHER): Payer: Medicare Other | Admitting: Family Medicine

## 2020-03-01 ENCOUNTER — Encounter: Payer: Self-pay | Admitting: Family Medicine

## 2020-03-01 VITALS — BP 140/80 | HR 86 | Temp 97.4°F | Ht 63.0 in | Wt 173.4 lb

## 2020-03-01 DIAGNOSIS — F515 Nightmare disorder: Secondary | ICD-10-CM | POA: Insufficient documentation

## 2020-03-01 DIAGNOSIS — R195 Other fecal abnormalities: Secondary | ICD-10-CM

## 2020-03-01 DIAGNOSIS — I10 Essential (primary) hypertension: Secondary | ICD-10-CM

## 2020-03-01 DIAGNOSIS — I5022 Chronic systolic (congestive) heart failure: Secondary | ICD-10-CM

## 2020-03-01 DIAGNOSIS — I2510 Atherosclerotic heart disease of native coronary artery without angina pectoris: Secondary | ICD-10-CM | POA: Diagnosis not present

## 2020-03-01 DIAGNOSIS — E119 Type 2 diabetes mellitus without complications: Secondary | ICD-10-CM

## 2020-03-01 HISTORY — DX: Nightmare disorder: F51.5

## 2020-03-01 HISTORY — DX: Other fecal abnormalities: R19.5

## 2020-03-01 LAB — BASIC METABOLIC PANEL
BUN: 17 mg/dL (ref 6–23)
CO2: 27 mEq/L (ref 19–32)
Calcium: 10.1 mg/dL (ref 8.4–10.5)
Chloride: 104 mEq/L (ref 96–112)
Creatinine, Ser: 0.91 mg/dL (ref 0.40–1.20)
GFR: 73.98 mL/min (ref >60.00)
Glucose, Bld: 64 mg/dL — ABNORMAL LOW (ref 70–99)
Potassium: 4 mEq/L (ref 3.5–5.1)
Sodium: 138 mEq/L (ref 135–145)

## 2020-03-01 LAB — HEMOGLOBIN A1C: Hgb A1c MFr Bld: 6.4 % (ref 4.6–6.5)

## 2020-03-01 NOTE — Progress Notes (Signed)
Melanie Rumps, MD Phone: [phone]  Melanie Ray is a 69 y.o. female who presents today for f/u.  DIABETES Disease Monitoring: Blood Sugar ranges-not checking Polyuria/phagia/dipsia- chronic      Optho- due next month Medications: Compliance- taking metformin Hypoglycemic symptoms- no  HYPERTENSION/CHF Disease Monitoring  Home BP Monitoring not checking Chest pain- no    Dyspnea- no   Orthopnea-  No    PND-   no Medications  Compliance-  Taking coreg, lasix, imdur, spironolactone.   Edema- occasional that resolves over night  Nightmares: Patient notes these could occur on most nights over the last month.  She has had them in the past but not this frequently.  She wakes up multiple times at night.  She does follow with psychiatry.  She is on Latuda, Effexor, and Zyprexa.  She notes no recent medication changes.  No recent life changes.  Positive Cologuard: Based on review of GI notes it appears she had a positive Cologuard in 2020.  Prior colonoscopy in 2018 was a poor prep and was not completed.  She was recommended to have a colonoscopy following a positive Cologuard test.     Social History   Tobacco Use  Smoking Status Former Smoker  . Types: Cigarettes  . Quit date: 04/13/1997  . Years since quitting: 22.8  Smokeless Tobacco Never Used     ROS see history of present illness  Objective  Physical Exam Vitals:   03/01/20 1042  BP: 140/80  Pulse: 86  Temp: (!) 97.4 F (36.3 C)  SpO2: 96%    BP Readings from Last 3 Encounters:  03/01/20 140/80  02/17/20 136/62  09/29/19 105/68   Wt Readings from Last 3 Encounters:  03/01/20 173 lb 6.4 oz (78.7 kg)  02/17/20 170 lb 3.2 oz (77.2 kg)  10/16/19 174 lb (78.9 kg)    Physical Exam Constitutional:      General: She is not in acute distress.    Appearance: She is not diaphoretic.  Cardiovascular:     Rate and Rhythm: Normal rate and regular rhythm.     Heart sounds: Normal heart sounds.  Pulmonary:      Effort: Pulmonary effort is normal.     Breath sounds: Normal breath sounds.  Musculoskeletal:     Right lower leg: No edema.     Left lower leg: No edema.  Skin:    General: Skin is warm and dry.  Neurological:     Mental Status: She is alert.    Diabetic Foot Exam - Simple   Simple Foot Form Diabetic Foot exam was performed with the following findings: Yes 03/01/2020 11:00 AM  Visual Inspection No deformities, no ulcerations, no other skin breakdown bilaterally: Yes Sensation Testing Intact to touch and monofilament testing bilaterally: Yes Pulse Check Posterior Tibialis and Dorsalis pulse intact bilaterally: Yes Comments      Assessment/Plan: Please see individual problem list.  Essential hypertension Borderline today.  She will start checking her blood pressure as she was advised by her cardiologist.  Continue her current regimen.  If it is running greater than 130/80 at home she will let us or her cardiologist know.  Chronic systolic CHF (congestive heart failure) (HCC) Appears euvolemic.  She will monitor.  DM type 2 (diabetes mellitus, type 2) (HCC) Check A1c.  Continue Metformin.  Positive colorectal cancer screening using Cologuard test Refer back to GI.  Nightmares It appears Effexor can cause abnormal dreams though she is not had any recent medication changes.  I  encouraged her to follow-up with her psychiatrist regarding this.  The patient asked about Ativan to help her sleep.  I deferred this at this time to have her follow-up with her psychiatrist.  Orders Placed This Encounter  Procedures  . HgB A1c  . Basic Metabolic Panel (BMET)  . Ambulatory referral to Gastroenterology    Referral Priority:   Routine    Referral Type:   Consultation    Referral Reason:   Specialty Services Required    Number of Visits Requested:   1    No orders of the defined types were placed in this encounter.   This visit occurred during the SARS-CoV-2 public health  emergency.  Safety protocols were in place, including screening questions prior to the visit, additional usage of staff PPE, and extensive cleaning of exam room while observing appropriate contact time as indicated for disinfecting solutions.    Melanie Rumps, MD [place]

## 2020-03-01 NOTE — Assessment & Plan Note (Signed)
It appears Effexor can cause abnormal dreams though she is not had any recent medication changes.  I encouraged her to follow-up with her psychiatrist regarding this.

## 2020-03-01 NOTE — Patient Instructions (Signed)
Nice to see you. We will get labs today. I referred you back to GI. Please check your blood pressure daily and if it is greater than 130/80 consistently please let us or your cardiology office know. Please see your psychiatrist for your nightmares.  Effexor may be contributing though please do not make any medication changes until you speak with your psychiatrist.

## 2020-03-01 NOTE — Assessment & Plan Note (Signed)
Appears euvolemic.  She will monitor.

## 2020-03-01 NOTE — Assessment & Plan Note (Addendum)
Borderline today.  She will start checking her blood pressure as she was advised by her cardiologist.  Continue her current regimen.  If it is running greater than 130/80 at home she will let us or her cardiologist know.

## 2020-03-01 NOTE — Assessment & Plan Note (Signed)
Check A1c.  Continue Metformin. 

## 2020-03-01 NOTE — Assessment & Plan Note (Signed)
Refer back to GI. 

## 2020-03-05 ENCOUNTER — Other Ambulatory Visit: Payer: Self-pay | Admitting: Physician Assistant

## 2020-03-09 ENCOUNTER — Other Ambulatory Visit: Payer: Self-pay

## 2020-03-09 ENCOUNTER — Telehealth (INDEPENDENT_AMBULATORY_CARE_PROVIDER_SITE_OTHER): Payer: Self-pay | Admitting: Gastroenterology

## 2020-03-09 DIAGNOSIS — R195 Other fecal abnormalities: Secondary | ICD-10-CM

## 2020-03-09 NOTE — Progress Notes (Signed)
Gastroenterology Pre-Procedure Review  Request Date: Tuesday 03/29/20 Requesting Physician: Dr. Allen Norris  PATIENT REVIEW QUESTIONS: The patient responded to the following health history questions as indicated:    1. Are you having any GI issues? yes (yes 2009) 2. Do you have a personal history of Polyps? yes (2009 colon polyps) 3. Do you have a family history of Colon Cancer or Polyps? no 4. Diabetes Mellitus? no 5. Joint replacements in the past 12 months?no 6. Major health problems in the past 3 months?no 7. Any artificial heart valves, MVP, or defibrillator?no    MEDICATIONS & ALLERGIES:    Patient reports the following regarding taking any anticoagulation/antiplatelet therapy:   Plavix, Coumadin, Eliquis, Xarelto, Lovenox, Pradaxa, Brilinta, or Effient? no Aspirin? yes (81 mg daily)  Patient confirms/reports the following medications:  Current Outpatient Medications  Medication Sig Dispense Refill  . aspirin EC 81 MG EC tablet Take 1 tablet (81 mg total) by mouth daily.    [name] atorvastatin (LIPITOR) 80 MG tablet Take 1 tablet by mouth once daily 90 tablet 3  . blood glucose meter kit and supplies KIT Dispense based on patient and insurance preference.  Check glucose once daily fasting in the morning. (FOR ICD 10 E11.9). 1 each 0  . Cyanocobalamin (VITAMIN B-12 PO) Take 1 capsule by mouth daily.    . dorzolamide-timolol (COSOPT) 22.3-6.8 MG/ML ophthalmic solution Place 1 drop into both eyes 2 (two) times daily.     [name] LATUDA 40 MG TABS tablet Take 40 mg by mouth daily with breakfast.     . metFORMIN (GLUCOPHAGE) 500 MG tablet TAKE 1 TABLET BY MOUTH TWICE DAILY WITH  A  MEAL 180 tablet 0  . Multiple Vitamin (MULTI-VITAMIN DAILY) TABS Take 1 tablet by mouth daily.    . nitroGLYCERIN (NITROSTAT) 0.4 MG SL tablet Place 1 tablet (0.4 mg total) under the tongue every 5 (five) minutes x 3 doses as needed for chest pain (Do not exceed 3 doses). 25 tablet 11  . OLANZapine (ZYPREXA) 5 MG tablet  Take 1 tablet (5 mg total) by mouth at bedtime. For anxiety and sleep 30 tablet 1  . olmesartan (BENICAR) 20 MG tablet Take 20 mg by mouth daily.    . ondansetron (ZOFRAN) 4 MG tablet TAKE 1 TABLET BY MOUTH ONCE DAILY AS NEEDED FOR NAUSEA AND VOMITING 20 tablet 1  . spironolactone (ALDACTONE) 25 MG tablet Take 1/2 (one-half) tablet by mouth once daily 45 tablet 3  . traZODone (DESYREL) 100 MG tablet Take 100 mg by mouth at bedtime as needed for sleep.    [name] venlafaxine XR (EFFEXOR-XR) 150 MG 24 hr capsule TAKE 2 CAPSULES BY MOUTH ONCE DAILY (NEEDS  APPOINTMENT  FOR  FURTHER  REFILLS) 60 capsule 0  . carvedilol (COREG) 12.5 MG tablet Take 1 tablet (12.5 mg total) by mouth 2 (two) times daily with a meal. 180 tablet 3  . furosemide (LASIX) 40 MG tablet Take 1 tablet (40 mg total) by mouth as directed. 40 mg on Mon, Wed and Fri's; all other days 20 mg 65 tablet 3  . isosorbide mononitrate (IMDUR) 30 MG 24 hr tablet Take 1 tablet (30 mg total) by mouth daily. (Patient not taking: Reported on 03/09/2020) 90 tablet 0   No current facility-administered medications for this visit.    Patient confirms/reports the following allergies:  Allergies  Allergen Reactions  . Tetracycline Swelling  . Meperidine Nausea And Vomiting    Nausea    No orders of the defined types  were placed in this encounter.   AUTHORIZATION INFORMATION Primary Insurance: 1D#: Group #:  Secondary Insurance: 1D#: Group #:  SCHEDULE INFORMATION: Date: Tuesday 03/29/20 Time: Location:[facility]

## 2020-03-14 DIAGNOSIS — H401132 Primary open-angle glaucoma, bilateral, moderate stage: Secondary | ICD-10-CM | POA: Diagnosis not present

## 2020-03-14 DIAGNOSIS — Z961 Presence of intraocular lens: Secondary | ICD-10-CM | POA: Diagnosis not present

## 2020-03-18 DIAGNOSIS — F422 Mixed obsessional thoughts and acts: Secondary | ICD-10-CM | POA: Diagnosis not present

## 2020-03-18 DIAGNOSIS — Z79899 Other long term (current) drug therapy: Secondary | ICD-10-CM | POA: Diagnosis not present

## 2020-03-23 ENCOUNTER — Other Ambulatory Visit: Payer: Self-pay | Admitting: Physician Assistant

## 2020-03-25 ENCOUNTER — Other Ambulatory Visit
Admission: RE | Admit: 2020-03-25 | Discharge: 2020-03-25 | Disposition: A | Discharge status: Home / Self Care | Payer: Medicare Other | Source: Ambulatory Visit | Attending: Gastroenterology | Admitting: Gastroenterology

## 2020-03-25 ENCOUNTER — Other Ambulatory Visit: Payer: Self-pay

## 2020-03-25 DIAGNOSIS — Z01812 Encounter for preprocedural laboratory examination: Secondary | ICD-10-CM | POA: Diagnosis not present

## 2020-03-25 DIAGNOSIS — Z20822 Contact with and (suspected) exposure to covid-19: Secondary | ICD-10-CM | POA: Diagnosis not present

## 2020-03-26 LAB — SARS CORONAVIRUS 2 (TAT 6-24 HRS): SARS Coronavirus 2: NEGATIVE

## 2020-03-29 ENCOUNTER — Encounter: Payer: Self-pay | Admitting: Gastroenterology

## 2020-03-29 ENCOUNTER — Ambulatory Visit
Admission: RE | Admit: 2020-03-29 | Discharge: 2020-03-29 | Disposition: A | Discharge status: Home / Self Care | Payer: Medicare Other | Attending: Gastroenterology | Admitting: Gastroenterology

## 2020-03-29 ENCOUNTER — Other Ambulatory Visit: Payer: Self-pay

## 2020-03-29 ENCOUNTER — Ambulatory Visit: Payer: Medicare Other | Admitting: Certified Registered"

## 2020-03-29 ENCOUNTER — Encounter
Admission: RE | Disposition: A | Discharge status: Home / Self Care | Payer: Self-pay | Source: Home / Self Care | Attending: Gastroenterology

## 2020-03-29 DIAGNOSIS — R195 Other fecal abnormalities: Secondary | ICD-10-CM

## 2020-03-29 DIAGNOSIS — Z955 Presence of coronary angioplasty implant and graft: Secondary | ICD-10-CM | POA: Insufficient documentation

## 2020-03-29 DIAGNOSIS — H42 Glaucoma in diseases classified elsewhere: Secondary | ICD-10-CM | POA: Diagnosis not present

## 2020-03-29 DIAGNOSIS — G4730 Sleep apnea, unspecified: Secondary | ICD-10-CM | POA: Insufficient documentation

## 2020-03-29 DIAGNOSIS — I447 Left bundle-branch block, unspecified: Secondary | ICD-10-CM | POA: Diagnosis not present

## 2020-03-29 DIAGNOSIS — E1139 Type 2 diabetes mellitus with other diabetic ophthalmic complication: Secondary | ICD-10-CM | POA: Diagnosis not present

## 2020-03-29 DIAGNOSIS — Z6829 Body mass index (BMI) 29.0-29.9, adult: Secondary | ICD-10-CM | POA: Diagnosis not present

## 2020-03-29 DIAGNOSIS — I252 Old myocardial infarction: Secondary | ICD-10-CM | POA: Diagnosis not present

## 2020-03-29 DIAGNOSIS — Z7984 Long term (current) use of oral hypoglycemic drugs: Secondary | ICD-10-CM | POA: Diagnosis not present

## 2020-03-29 DIAGNOSIS — Z833 Family history of diabetes mellitus: Secondary | ICD-10-CM | POA: Insufficient documentation

## 2020-03-29 DIAGNOSIS — F329 Major depressive disorder, single episode, unspecified: Secondary | ICD-10-CM | POA: Insufficient documentation

## 2020-03-29 DIAGNOSIS — Z881 Allergy status to other antibiotic agents status: Secondary | ICD-10-CM | POA: Insufficient documentation

## 2020-03-29 DIAGNOSIS — K635 Polyp of colon: Secondary | ICD-10-CM | POA: Diagnosis not present

## 2020-03-29 DIAGNOSIS — I255 Ischemic cardiomyopathy: Secondary | ICD-10-CM | POA: Insufficient documentation

## 2020-03-29 DIAGNOSIS — F419 Anxiety disorder, unspecified: Secondary | ICD-10-CM | POA: Diagnosis not present

## 2020-03-29 DIAGNOSIS — I2510 Atherosclerotic heart disease of native coronary artery without angina pectoris: Secondary | ICD-10-CM | POA: Insufficient documentation

## 2020-03-29 DIAGNOSIS — M109 Gout, unspecified: Secondary | ICD-10-CM | POA: Insufficient documentation

## 2020-03-29 DIAGNOSIS — K640 First degree hemorrhoids: Secondary | ICD-10-CM | POA: Diagnosis not present

## 2020-03-29 DIAGNOSIS — Z823 Family history of stroke: Secondary | ICD-10-CM | POA: Insufficient documentation

## 2020-03-29 DIAGNOSIS — Z7982 Long term (current) use of aspirin: Secondary | ICD-10-CM | POA: Diagnosis not present

## 2020-03-29 DIAGNOSIS — K219 Gastro-esophageal reflux disease without esophagitis: Secondary | ICD-10-CM | POA: Insufficient documentation

## 2020-03-29 DIAGNOSIS — D122 Benign neoplasm of ascending colon: Secondary | ICD-10-CM | POA: Insufficient documentation

## 2020-03-29 DIAGNOSIS — I110 Hypertensive heart disease with heart failure: Secondary | ICD-10-CM | POA: Insufficient documentation

## 2020-03-29 DIAGNOSIS — Z8249 Family history of ischemic heart disease and other diseases of the circulatory system: Secondary | ICD-10-CM | POA: Insufficient documentation

## 2020-03-29 DIAGNOSIS — Z888 Allergy status to other drugs, medicaments and biological substances status: Secondary | ICD-10-CM | POA: Insufficient documentation

## 2020-03-29 DIAGNOSIS — Z8489 Family history of other specified conditions: Secondary | ICD-10-CM | POA: Insufficient documentation

## 2020-03-29 DIAGNOSIS — K5730 Diverticulosis of large intestine without perforation or abscess without bleeding: Secondary | ICD-10-CM | POA: Diagnosis not present

## 2020-03-29 DIAGNOSIS — F411 Generalized anxiety disorder: Secondary | ICD-10-CM | POA: Diagnosis not present

## 2020-03-29 DIAGNOSIS — Z818 Family history of other mental and behavioral disorders: Secondary | ICD-10-CM | POA: Insufficient documentation

## 2020-03-29 DIAGNOSIS — Z87891 Personal history of nicotine dependence: Secondary | ICD-10-CM | POA: Diagnosis not present

## 2020-03-29 DIAGNOSIS — I5022 Chronic systolic (congestive) heart failure: Secondary | ICD-10-CM | POA: Insufficient documentation

## 2020-03-29 DIAGNOSIS — E669 Obesity, unspecified: Secondary | ICD-10-CM | POA: Insufficient documentation

## 2020-03-29 DIAGNOSIS — E785 Hyperlipidemia, unspecified: Secondary | ICD-10-CM | POA: Insufficient documentation

## 2020-03-29 DIAGNOSIS — Z820 Family history of epilepsy and other diseases of the nervous system: Secondary | ICD-10-CM | POA: Insufficient documentation

## 2020-03-29 DIAGNOSIS — Z8349 Family history of other endocrine, nutritional and metabolic diseases: Secondary | ICD-10-CM | POA: Insufficient documentation

## 2020-03-29 DIAGNOSIS — K5790 Diverticulosis of intestine, part unspecified, without perforation or abscess without bleeding: Secondary | ICD-10-CM | POA: Diagnosis not present

## 2020-03-29 HISTORY — PX: COLONOSCOPY WITH PROPOFOL: SHX5780

## 2020-03-29 LAB — GLUCOSE, CAPILLARY: Glucose-Capillary: 129 mg/dL — ABNORMAL HIGH (ref 70–99)

## 2020-03-29 SURGERY — COLONOSCOPY WITH PROPOFOL
Anesthesia: General

## 2020-03-29 MED ORDER — PROPOFOL 500 MG/50ML IV EMUL
INTRAVENOUS | Status: AC
  Filled 2020-03-29: qty 50

## 2020-03-29 MED ORDER — PROPOFOL 10 MG/ML IV BOLUS
INTRAVENOUS | Status: AC
  Filled 2020-03-29: qty 60

## 2020-03-29 MED ORDER — PROPOFOL 10 MG/ML IV BOLUS
INTRAVENOUS | Status: DC | PRN
Start: 2020-03-29 — End: 2020-03-29
  Administered 2020-03-29: 50 mg via INTRAVENOUS

## 2020-03-29 MED ORDER — PROPOFOL 500 MG/50ML IV EMUL
INTRAVENOUS | Status: DC | PRN
  Administered 2020-03-29: 100 ug/kg/min via INTRAVENOUS

## 2020-03-29 MED ORDER — SODIUM CHLORIDE 0.9 % IV SOLN: INTRAVENOUS | Status: DC

## 2020-03-29 NOTE — Anesthesia Postprocedure Evaluation (Deleted)
Anesthesia Post Note  Patient: Melanie Ray  Procedure(s) Performed: COLONOSCOPY WITH PROPOFOL (N/A )  Patient location during evaluation: Phase II Anesthesia Type: MAC Level of consciousness: awake Pain management: pain level controlled Vital Signs Assessment: post-procedure vital signs reviewed and stable Respiratory status: spontaneous breathing Cardiovascular status: blood pressure returned to baseline Postop Assessment: no headache Anesthetic complications: no     Last Vitals:  Vitals:   03/29/20 0718 03/29/20 0820  BP: (!) 147/65 (!) 126/43  Pulse: 77 77  Resp: 16 19  Temp: (!) 35.8 C   SpO2: 97% 95%    Last Pain: There were no vitals filed for this visit.               Dierdre Forth Disser

## 2020-03-29 NOTE — Transfer of Care (Signed)
Immediate Anesthesia Transfer of Care Note  Patient: Melanie Ray  Procedure(s) Performed: COLONOSCOPY WITH PROPOFOL (N/A )  Patient Location: PACU  Anesthesia Type:MAC and General  Level of Consciousness: awake  Airway & Oxygen Therapy: Patient Spontanous Breathing and Patient connected to nasal cannula oxygen  Post-op Assessment: Report given to RN  Post vital signs: Reviewed  Last Vitals:  Vitals Value Taken Time  BP    Temp    Pulse 77 03/29/20 0820  Resp 19 03/29/20 0820  SpO2 95 % 03/29/20 0820  Vitals shown include unvalidated device data.  Last Pain: There were no vitals filed for this visit.       Complications: No apparent anesthesia complications

## 2020-03-29 NOTE — H&P (Signed)
Melanie Lame, MD [facility] [address] [place], [place] [address] Phone:[phone] Fax : [phone]  Primary Care Physician:  Leone Haven, MD Primary Gastroenterologist:  Dr. Allen Norris  Pre-Procedure History & Physical: HPI:  Melanie Ray is a 69 y.o. female is here for an colonoscopy.   Past Medical History:  Diagnosis Date  . Anemia   . Anxiety   . Back pain   . CAD (coronary artery disease) 04/11/2016   S/p ant STEMI 5/17: LHC >> LAD proximal 80%, mid 80%, distal 50%, ostial D1 60%; LCx with LPDA lesion 30%; RCA Mild calcification with no significant stenosis in a medium caliber, nondominant RCA; LVEF is estimated at 45% with inferoapical and lateral wall akinesis >> PCI: PCI: 3.5 x 24 mm Promus DES to prox LAD, 2.5 x 12 mm Promus DES to mid LAD.   [name] Chest pain   . Chronic systolic CHF (congestive heart failure) ([place]) 03/21/2016   Echo 01/30/17: Diff HK, mild focal basal septal hypertrophy, EF 30-35, mild AI, MAC, mild MR // Echo 06/08/16: Mild focal basal septal hypertrophy, EF 25-30%, diff HK, ant-septal AK, Gr 1 DD, mild AI, MAC, mild MR, PASP 37 mmHg // Echo 03/18/16: EF 30-35%, ant-septal AK, Gr 1 DD, mild MR, severe LAE.  [name] Constipation   . Depression   . Diabetes mellitus type 2 in obese ([place])   . Dizziness   . Glaucoma   . Gout   . Heart disease   . Heartburn   . History of acute anterior wall MI 03/17/2016  . History of heart attack   . Hyperlipemia   . Hypertension   . Ischemic cardiomyopathy 10/02/2016   Refused ICD  . Joint pain   . Left bundle branch block   . Myocardial infarction ([place])   . Nausea   . Sleep apnea   . SOB (shortness of breath)   . Suicidal ideation 08/27/2018  . Swelling    feet or legs    Past Surgical History:  Procedure Laterality Date  . APPENDECTOMY    . CARDIAC CATHETERIZATION N/A 03/17/2016   Procedure: Left Heart Cath and Coronary Angiography;  Surgeon: Sherren Mocha, MD;  Location: [facility];  Service:  Cardiovascular;  Laterality: N/A;  . CARDIAC CATHETERIZATION N/A 03/17/2016   Procedure: Coronary Stent Intervention;  Surgeon: Sherren Mocha, MD;  Location: [facility];  Service: Cardiovascular;  Laterality: N/A;  . COLONOSCOPY WITH PROPOFOL N/A 08/13/2017   Procedure: COLONOSCOPY WITH PROPOFOL;  Surgeon: Jonathon Bellows, MD;  Location: [facility] ENDOSCOPY;  Service: Gastroenterology;  Laterality: N/A;  . SMALL BOWEL REPAIR    . TONSILLECTOMY    . UTERINE FIBROID SURGERY      Prior to Admission medications   Medication Sig Start Date End Date Taking? Authorizing Provider  aspirin EC 81 MG EC tablet Take 1 tablet (81 mg total) by mouth daily. 03/21/16  Yes Simmons, Brittainy M, PA-C  carvedilol (COREG) 12.5 MG tablet TAKE 1 TABLET BY MOUTH TWICE DAILY WITH MEALS 03/23/20  Yes Weaver, Scott T, PA-C  LATUDA 40 MG TABS tablet Take 40 mg by mouth daily with breakfast.  04/28/19  Yes [provider]  metFORMIN (GLUCOPHAGE) 500 MG tablet TAKE 1 TABLET BY MOUTH TWICE DAILY WITH  A  MEAL 02/01/20  Yes Leone Haven, MD  OLANZapine (ZYPREXA) 5 MG tablet Take 1 tablet (5 mg total) by mouth at bedtime. For anxiety and sleep 01/05/19  Yes Ursula Alert, MD  spironolactone (  ALDACTONE) 25 MG tablet Take 1/2 (one-half) tablet by mouth once daily 03/07/20  Yes Sherren Mocha, MD  venlafaxine XR (EFFEXOR-XR) 150 MG 24 hr capsule TAKE 2 CAPSULES BY MOUTH ONCE DAILY (NEEDS  APPOINTMENT  FOR  FURTHER  REFILLS) 03/13/19  Yes Ursula Alert, MD  atorvastatin (LIPITOR) 80 MG tablet Take 1 tablet by mouth once daily 02/19/20   Richardson Dopp T, PA-C  blood glucose meter kit and supplies KIT Dispense based on patient and insurance preference.  Check glucose once daily fasting in the morning. (FOR ICD 10 E11.9). 10/16/19   Leone Haven, MD  Cyanocobalamin (VITAMIN B-12 PO) Take 1 capsule by mouth daily.    [provider]  dorzolamide-timolol (COSOPT) 22.3-6.8 MG/ML ophthalmic solution Place 1 drop  into both eyes 2 (two) times daily.  01/17/15   [provider]  furosemide (LASIX) 40 MG tablet Take 1 tablet (40 mg total) by mouth as directed. 40 mg on Mon, Wed and Fri's; all other days 20 mg 12/30/18 02/17/20  Richardson Dopp T, PA-C  isosorbide mononitrate (IMDUR) 30 MG 24 hr tablet Take 1 tablet (30 mg total) by mouth daily. Patient not taking: Reported on 03/09/2020 01/06/20 01/05/21  Sherren Mocha, MD  Multiple Vitamin (MULTI-VITAMIN DAILY) TABS Take 1 tablet by mouth daily.    [provider]  nitroGLYCERIN (NITROSTAT) 0.4 MG SL tablet Place 1 tablet (0.4 mg total) under the tongue every 5 (five) minutes x 3 doses as needed for chest pain (Do not exceed 3 doses). 12/30/18   Richardson Dopp T, PA-C  olmesartan (BENICAR) 20 MG tablet Take 20 mg by mouth daily.    [provider]  ondansetron (ZOFRAN) 4 MG tablet TAKE 1 TABLET BY MOUTH ONCE DAILY AS NEEDED FOR NAUSEA AND VOMITING Patient not taking: Reported on 03/29/2020 05/29/18   Leone Haven, MD  traZODone (DESYREL) 100 MG tablet Take 100 mg by mouth at bedtime as needed for sleep.    [provider]    Allergies as of 03/09/2020 - Review Complete 03/01/2020  Allergen Reaction Noted  . Tetracycline Swelling 04/27/2008  . Meperidine Nausea And Vomiting 04/20/2009    Family History  Problem Relation Age of Onset  . Anxiety disorder Mother   . Paranoid behavior Mother   . Hypertension Mother   . Heart failure Mother   . Stroke Mother   . Dementia Mother   . High Cholesterol Mother   . Diabetes Mother   . Hyperlipidemia Mother   . Heart disease Mother   . Depression Mother   . Hypertension Father   . High Cholesterol Father   . Mood Disorder Sister   . Stroke Sister   . Anxiety disorder Maternal Aunt   . Drug abuse Cousin   . Tuberculosis Paternal Grandfather     Social History   Socioeconomic History  . Marital status: Married    Spouse name: Not on file  . Number of children: 0  .  Years of education: BS in education  . Highest education level: Not on file  Occupational History  . Occupation:  Retired Education officer, museum  Tobacco Use  . Smoking status: Former Smoker    Types: Cigarettes    Quit date: 04/13/1997    Years since quitting: 22.9  . Smokeless tobacco: Never Used  Substance and Sexual Activity  . Alcohol use: Yes    Alcohol/week: 1.0 standard drinks    Types: 1 Glasses of wine per week    Comment:  2-3 glasses of wine per month  . Drug use: No  . Sexual activity: Never  Other Topics Concern  . Not on file  Social History Narrative   Lives in [place] with spouse.  No children.   Retired first [employer] for over 30 years ([place] for 10 years and then in [place] for over 20 years).   Left-handed   Social Determinants of Health   Financial Resource Strain:   . Difficulty of Paying Living Expenses:   Food Insecurity:   . Worried About Charity fundraiser in the Last Year:   . Arboriculturist in the Last Year:   Transportation Needs:   . Film/video editor (Medical):   [name] Lack of Transportation (Non-Medical):   Physical Activity:   . Days of Exercise per Week:   . Minutes of Exercise per Session:   Stress:   . Feeling of Stress :   Social Connections:   . Frequency of Communication with Friends and Family:   . Frequency of Social Gatherings with Friends and Family:   . Attends Religious Services:   . Active Member of Clubs or Organizations:   . Attends Archivist Meetings:   [name] Marital Status:   Intimate Partner Violence:   . Fear of Current or Ex-Partner:   . Emotionally Abused:   [name] Physically Abused:   . Sexually Abused:     Review of Systems: See HPI, otherwise negative ROS  Physical Exam: There were no vitals taken for this visit. General:   Alert,  pleasant and cooperative in NAD Head:  Normocephalic and atraumatic. Neck:  Supple; no masses or thyromegaly. Lungs:  Clear throughout to auscultation.    Heart:   Regular rate and rhythm. Abdomen:  Soft, nontender and nondistended. Normal bowel sounds, without guarding, and without rebound.   Neurologic:  Alert and  oriented x4;  grossly normal neurologically.  Impression/Plan: Melanie Ray is here for an colonoscopy to be performed for positive Cologuard  Risks, benefits, limitations, and alternatives regarding  colonoscopy have been reviewed with the patient.  Questions have been answered.  All parties agreeable.   Melanie Lame, MD  03/29/2020, 7:16 AM

## 2020-03-29 NOTE — Anesthesia Preprocedure Evaluation (Signed)
Anesthesia Evaluation  Patient identified by MRN, date of birth, ID band Patient awake    Reviewed: Allergy & Precautions, H&P , NPO status , Patient's Chart, lab work & pertinent test results, reviewed documented beta blocker date and time   History of Anesthesia Complications Negative for: history of anesthetic complications  Airway Mallampati: II   Neck ROM: full    Dental  (+) Poor Dentition, Dental Advidsory Given   Pulmonary neg shortness of breath, sleep apnea , neg COPD, neg recent URI, former smoker,    Pulmonary exam normal        Cardiovascular Exercise Tolerance: Poor hypertension, On Medications (-) angina+ CAD, + Past MI, + Cardiac Stents, + Peripheral Vascular Disease and +CHF  (-) CABG Normal cardiovascular exam+ dysrhythmias (-) Valvular Problems/Murmurs Rhythm:regular Rate:Normal     Neuro/Psych PSYCHIATRIC DISORDERS Anxiety Depression negative neurological ROS     GI/Hepatic Neg liver ROS, GERD  ,  Endo/Other  diabetes  Renal/GU negative Renal ROS  negative genitourinary   Musculoskeletal   Abdominal   Peds  Hematology negative hematology ROS (+) Blood dyscrasia, anemia ,   Anesthesia Other Findings Past Medical History: No date: Anemia No date: Anxiety No date: Back pain 04/11/2016: CAD (coronary artery disease)     Comment:  S/p ant STEMI 5/17: LHC >> LAD proximal 80%, mid 80%,               distal 50%, ostial D1 60%; LCx with LPDA lesion 30%; RCA               Mild calcification with no significant stenosis in a               medium caliber, nondominant RCA; LVEF is estimated at 45%              with inferoapical and lateral wall akinesis >> PCI: PCI:               3.5 x 24 mm Promus DES to prox LAD, 2.5 x 12 mm Promus               DES to mid LAD.  No date: Chest pain 8/58/8502: Chronic systolic CHF (congestive heart failure) ([place])     Comment:  Echo 01/30/17: Diff HK, mild focal basal  septal               hypertrophy, EF 30-35, mild AI, MAC, mild MR // Echo               06/08/16: Mild focal basal septal hypertrophy, EF 25-30%,               diff HK, ant-septal AK, Gr 1 DD, mild AI, MAC, mild MR,               PASP 37 mmHg // Echo 03/18/16: EF 30-35%, ant-septal AK,               Gr 1 DD, mild MR, severe LAE. No date: Constipation No date: Depression No date: Diabetes mellitus type 2 in obese (HCC) No date: Dizziness No date: Glaucoma No date: Gout No date: Heart disease No date: Heartburn 03/17/2016: History of acute anterior wall MI No date: History of heart attack No date: Hyperlipemia No date: Hypertension 10/02/2016: Ischemic cardiomyopathy     Comment:  Refused ICD No date: Joint pain No date: Left bundle branch block No date: Myocardial infarction ([place]) No date: Nausea No date: Sleep apnea No date: SOB (  shortness of breath) No date: Swelling     Comment:  feet or legs Past Surgical History: No date: APPENDECTOMY 03/17/2016: CARDIAC CATHETERIZATION; N/A     Comment:  Procedure: Left Heart Cath and Coronary Angiography;                Surgeon: Sherren Mocha, MD;  Location: [place] [facility];  Service: Cardiovascular;  Laterality: N/A; 03/17/2016: CARDIAC CATHETERIZATION; N/A     Comment:  Procedure: Coronary Stent Intervention;  Surgeon:               Sherren Mocha, MD;  Location: [place] [facility];                Service: Cardiovascular;  Laterality: N/A; No date: SMALL BOWEL REPAIR No date: TONSILLECTOMY No date: UTERINE FIBROID SURGERY BMI    Body Mass Index:  30.11 kg/m     Reproductive/Obstetrics negative OB ROS                             Anesthesia Physical  Anesthesia Plan  ASA: III  Anesthesia Plan: General   Post-op Pain Management:    Induction: Intravenous  PONV Risk Score and Plan: 3 and Propofol infusion and TIVA  Airway Management Planned: Natural Airway and Nasal  Cannula  Additional Equipment:   Intra-op Plan:   Post-operative Plan:   Informed Consent: I have reviewed the patients History and Physical, chart, labs and discussed the procedure including the risks, benefits and alternatives for the proposed anesthesia with the patient or authorized representative who has indicated his/her understanding and acceptance.     Dental Advisory Given  Plan Discussed with: CRNA  Anesthesia Plan Comments:         Anesthesia Quick Evaluation

## 2020-03-29 NOTE — Anesthesia Postprocedure Evaluation (Signed)
Anesthesia Post Note  Patient: Melanie Ray  Procedure(s) Performed: COLONOSCOPY WITH PROPOFOL (N/A )  Patient location during evaluation: Short Stay Anesthesia Type: General Level of consciousness: awake and alert Pain management: pain level controlled Vital Signs Assessment: post-procedure vital signs reviewed and stable Respiratory status: spontaneous breathing, nonlabored ventilation, respiratory function stable and patient connected to nasal cannula oxygen Cardiovascular status: blood pressure returned to baseline and stable Postop Assessment: no apparent nausea or vomiting Anesthetic complications: no     Last Vitals:  Vitals:   03/29/20 0840 03/29/20 0850  BP: (!) 135/56 (!) 138/55  Pulse: 72 65  Resp: (!) 23 (!) 23  Temp:    SpO2: 99% 99%    Last Pain: There were no vitals filed for this visit.               Martha Clan

## 2020-03-29 NOTE — Op Note (Signed)
[facility] Gastroenterology Patient Name: Melanie Ray Procedure Date: 03/29/2020 7:59 AM MRN: [id number] Account #: [id number] Date of Birth: [birth year] Admit Type: Outpatient Age: 69 Room: [facility] ENDO ROOM 4 Gender: Female Note Status: Finalized Procedure:             Colonoscopy Indications:           Positive Cologuard test Providers:             Lucilla Lame MD, MD Referring MD:          Angela Adam. Caryl Bis (Referring MD) Medicines:             Propofol per Anesthesia Complications:         No immediate complications. Procedure:             Pre-Anesthesia Assessment:                        - Prior to the procedure, a History and Physical was                         performed, and patient medications and allergies were                         reviewed. The patient's tolerance of previous                         anesthesia was also reviewed. The risks and benefits                         of the procedure and the sedation options and risks                         were discussed with the patient. All questions were                         answered, and informed consent was obtained. Prior                         Anticoagulants: The patient has taken no previous                         anticoagulant or antiplatelet agents. ASA Grade                         Assessment: II - A patient with mild systemic disease.                         After reviewing the risks and benefits, the patient                         was deemed in satisfactory condition to undergo the                         procedure.                        After obtaining informed consent, the colonoscope was  passed under direct vision. Throughout the procedure,                         the patient's blood pressure, pulse, and oxygen                         saturations were monitored continuously. The                         Colonoscope was introduced through the anus and                    advanced to the the cecum, identified by appendiceal                         orifice and ileocecal valve. The colonoscopy was                         performed without difficulty. The patient tolerated                         the procedure well. The quality of the bowel                         preparation was fair. Findings:      The perianal and digital rectal examinations were normal.      Two sessile polyps were found in the ascending colon. The polyps were 2       to 3 mm in size. These polyps were removed with a cold biopsy forceps.       Resection and retrieval were complete.      A 4 mm polyp was found in the transverse colon. The polyp was sessile.       The polyp was removed with a cold biopsy forceps. Resection and       retrieval were complete.      A 4 mm polyp was found in the sigmoid colon. The polyp was sessile. The       polyp was removed with a cold biopsy forceps. Resection and retrieval       were complete.      Multiple small-mouthed diverticula were found in the sigmoid colon.      Non-bleeding internal hemorrhoids were found during retroflexion. The       hemorrhoids were Grade I (internal hemorrhoids that do not prolapse). Impression:            - Preparation of the colon was fair.                        - Two 2 to 3 mm polyps in the ascending colon, removed                         with a cold biopsy forceps. Resected and retrieved.                        - One 4 mm polyp in the transverse colon, removed with                         a cold biopsy forceps. Resected and retrieved.                        -  One 4 mm polyp in the sigmoid colon, removed with a                         cold biopsy forceps. Resected and retrieved.                        - Diverticulosis in the sigmoid colon.                        - Non-bleeding internal hemorrhoids. Recommendation:        - Discharge patient to home.                        - Resume previous diet.                         - Continue present medications.                        - Await pathology results.                        - Repeat colonoscopy in 5 years if polyp adenoma and                         10 years if hyperplastic Procedure Code(s):     --- Professional ---                        [phone], Colonoscopy, flexible; with biopsy, single or                         multiple Diagnosis Code(s):     --- Professional ---                        R19.5, Other fecal abnormalities                        K63.5, Polyp of colon CPT copyright 2019 [employer]. All rights reserved. The codes documented in this report are preliminary and upon coder review may  be revised to meet current compliance requirements. Lucilla Lame MD, MD 03/29/2020 8:19:19 AM This report has been signed electronically. Number of Addenda: 0 Note Initiated On: 03/29/2020 7:59 AM Scope Withdrawal Time: 0 hours 12 minutes 57 seconds  Total Procedure Duration: 0 hours 17 minutes 31 seconds  Estimated Blood Loss:  Estimated blood loss: none.      [facility]

## 2020-03-30 ENCOUNTER — Emergency Department: Payer: Medicare Other

## 2020-03-30 ENCOUNTER — Other Ambulatory Visit: Payer: Self-pay

## 2020-03-30 ENCOUNTER — Encounter: Payer: Self-pay | Admitting: *Deleted

## 2020-03-30 ENCOUNTER — Observation Stay
Admission: EM | Admit: 2020-03-30 | Discharge: 2020-03-31 | Disposition: A | Discharge status: Home / Self Care | Payer: Medicare Other | Attending: Internal Medicine | Admitting: Internal Medicine

## 2020-03-30 ENCOUNTER — Observation Stay: Payer: Medicare Other

## 2020-03-30 DIAGNOSIS — E785 Hyperlipidemia, unspecified: Secondary | ICD-10-CM | POA: Diagnosis present

## 2020-03-30 DIAGNOSIS — Z7984 Long term (current) use of oral hypoglycemic drugs: Secondary | ICD-10-CM | POA: Diagnosis not present

## 2020-03-30 DIAGNOSIS — F329 Major depressive disorder, single episode, unspecified: Secondary | ICD-10-CM | POA: Diagnosis not present

## 2020-03-30 DIAGNOSIS — H42 Glaucoma in diseases classified elsewhere: Secondary | ICD-10-CM | POA: Insufficient documentation

## 2020-03-30 DIAGNOSIS — I959 Hypotension, unspecified: Secondary | ICD-10-CM | POA: Diagnosis not present

## 2020-03-30 DIAGNOSIS — I110 Hypertensive heart disease with heart failure: Secondary | ICD-10-CM | POA: Diagnosis not present

## 2020-03-30 DIAGNOSIS — E1139 Type 2 diabetes mellitus with other diabetic ophthalmic complication: Secondary | ICD-10-CM | POA: Insufficient documentation

## 2020-03-30 DIAGNOSIS — M109 Gout, unspecified: Secondary | ICD-10-CM | POA: Diagnosis not present

## 2020-03-30 DIAGNOSIS — Z20822 Contact with and (suspected) exposure to covid-19: Secondary | ICD-10-CM | POA: Diagnosis not present

## 2020-03-30 DIAGNOSIS — R200 Anesthesia of skin: Secondary | ICD-10-CM | POA: Diagnosis not present

## 2020-03-30 DIAGNOSIS — E119 Type 2 diabetes mellitus without complications: Secondary | ICD-10-CM | POA: Insufficient documentation

## 2020-03-30 DIAGNOSIS — F419 Anxiety disorder, unspecified: Secondary | ICD-10-CM | POA: Diagnosis not present

## 2020-03-30 DIAGNOSIS — Z881 Allergy status to other antibiotic agents status: Secondary | ICD-10-CM | POA: Diagnosis not present

## 2020-03-30 DIAGNOSIS — I2510 Atherosclerotic heart disease of native coronary artery without angina pectoris: Secondary | ICD-10-CM | POA: Diagnosis present

## 2020-03-30 DIAGNOSIS — G4730 Sleep apnea, unspecified: Secondary | ICD-10-CM | POA: Insufficient documentation

## 2020-03-30 DIAGNOSIS — Z7982 Long term (current) use of aspirin: Secondary | ICD-10-CM | POA: Insufficient documentation

## 2020-03-30 DIAGNOSIS — I447 Left bundle-branch block, unspecified: Secondary | ICD-10-CM | POA: Diagnosis not present

## 2020-03-30 DIAGNOSIS — Z79899 Other long term (current) drug therapy: Secondary | ICD-10-CM | POA: Insufficient documentation

## 2020-03-30 DIAGNOSIS — G459 Transient cerebral ischemic attack, unspecified: Secondary | ICD-10-CM | POA: Diagnosis not present

## 2020-03-30 DIAGNOSIS — R7303 Prediabetes: Secondary | ICD-10-CM

## 2020-03-30 DIAGNOSIS — I69321 Dysphasia following cerebral infarction: Secondary | ICD-10-CM | POA: Diagnosis not present

## 2020-03-30 DIAGNOSIS — I252 Old myocardial infarction: Secondary | ICD-10-CM | POA: Diagnosis not present

## 2020-03-30 DIAGNOSIS — I5022 Chronic systolic (congestive) heart failure: Secondary | ICD-10-CM | POA: Diagnosis not present

## 2020-03-30 DIAGNOSIS — I639 Cerebral infarction, unspecified: Secondary | ICD-10-CM | POA: Diagnosis not present

## 2020-03-30 DIAGNOSIS — H409 Unspecified glaucoma: Secondary | ICD-10-CM | POA: Diagnosis present

## 2020-03-30 DIAGNOSIS — F32A Depression, unspecified: Secondary | ICD-10-CM | POA: Diagnosis present

## 2020-03-30 DIAGNOSIS — R0602 Shortness of breath: Secondary | ICD-10-CM | POA: Insufficient documentation

## 2020-03-30 DIAGNOSIS — Z885 Allergy status to narcotic agent status: Secondary | ICD-10-CM | POA: Insufficient documentation

## 2020-03-30 DIAGNOSIS — I10 Essential (primary) hypertension: Secondary | ICD-10-CM | POA: Diagnosis present

## 2020-03-30 DIAGNOSIS — Z955 Presence of coronary angioplasty implant and graft: Secondary | ICD-10-CM | POA: Insufficient documentation

## 2020-03-30 HISTORY — DX: Cerebral infarction, unspecified: I63.9

## 2020-03-30 LAB — DIFFERENTIAL
Abs Immature Granulocytes: 0.02 10*3/uL (ref 0.00–0.07)
Basophils Absolute: 0 10*3/uL (ref 0.0–0.1)
Basophils Relative: 1 %
Eosinophils Absolute: 0.1 10*3/uL (ref 0.0–0.5)
Eosinophils Relative: 1 %
Immature Granulocytes: 0 %
Lymphocytes Relative: 30 %
Lymphs Abs: 2 10*3/uL (ref 0.7–4.0)
Monocytes Absolute: 0.9 10*3/uL (ref 0.1–1.0)
Monocytes Relative: 13 %
Neutro Abs: 3.6 10*3/uL (ref 1.7–7.7)
Neutrophils Relative %: 55 %

## 2020-03-30 LAB — COMPREHENSIVE METABOLIC PANEL
ALT: 18 U/L (ref 0–44)
AST: 18 U/L (ref 15–41)
Albumin: 4.1 g/dL (ref 3.5–5.0)
Alkaline Phosphatase: 123 U/L (ref 38–126)
Anion gap: 8 (ref 5–15)
BUN: 19 mg/dL (ref 8–23)
CO2: 24 mmol/L (ref 22–32)
Calcium: 9.7 mg/dL (ref 8.9–10.3)
Chloride: 105 mmol/L (ref 98–111)
Creatinine, Ser: 0.94 mg/dL (ref 0.44–1.00)
GFR calc Af Amer: >60 mL/min (ref >60)
GFR calc non Af Amer: >60 mL/min (ref >60)
Glucose, Bld: 90 mg/dL (ref 70–99)
Potassium: 4 mmol/L (ref 3.5–5.1)
Sodium: 137 mmol/L (ref 135–145)
Total Bilirubin: 0.6 mg/dL (ref 0.3–1.2)
Total Protein: 7.5 g/dL (ref 6.5–8.1)

## 2020-03-30 LAB — CBC
HCT: 36.4 % (ref 36.0–46.0)
Hemoglobin: 11.9 g/dL — ABNORMAL LOW (ref 12.0–15.0)
MCH: 26.3 pg (ref 26.0–34.0)
MCHC: 32.7 g/dL (ref 30.0–36.0)
MCV: 80.4 fL (ref 80.0–100.0)
Platelets: 319 10*3/uL (ref 150–400)
RBC: 4.53 MIL/uL (ref 3.87–5.11)
RDW: 13.7 % (ref 11.5–15.5)
WBC: 6.6 10*3/uL (ref 4.0–10.5)
nRBC: 0 % (ref 0.0–0.2)

## 2020-03-30 LAB — PROTIME-INR
INR: 1 (ref 0.8–1.2)
Prothrombin Time: 13.1 seconds (ref 11.4–15.2)

## 2020-03-30 LAB — SARS CORONAVIRUS 2 BY RT PCR (HOSPITAL ORDER, PERFORMED IN ~~LOC~~ HOSPITAL LAB): SARS Coronavirus 2: NEGATIVE

## 2020-03-30 LAB — APTT: aPTT: 31 seconds (ref 24–36)

## 2020-03-30 IMAGING — CT CT HEAD W/O CM
3 of 4 series · 15 of 47 positions shown, 18 images · non-contrast
Comparison: None.

CLINICAL DATA: Right-sided facial, upper extremity, and lower
extremity numbness

EXAM:
CT HEAD WITHOUT CONTRAST
TECHNIQUE: Contiguous axial images were obtained from the base of the skull
through the vertex without intravenous contrast.

[Series 3: head wo · axial · 0.40mm/px · z∈[+110,+235]mm · 9 of 31 slices shown, 12 images]
[im 3/31  brain]
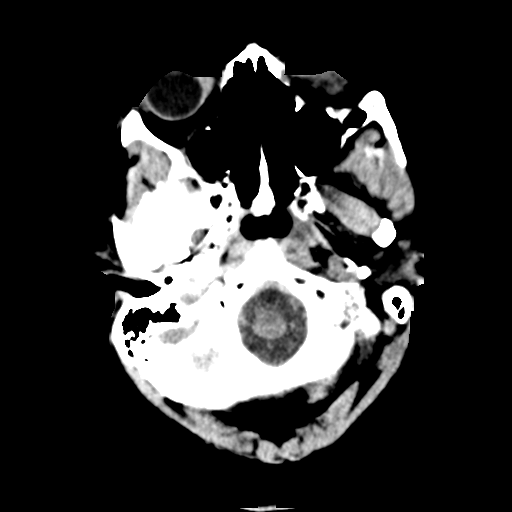
[im 3/31  bone]
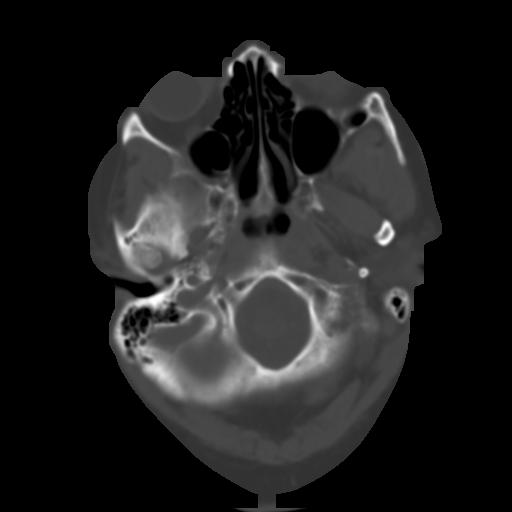
[im 7/31  brain]
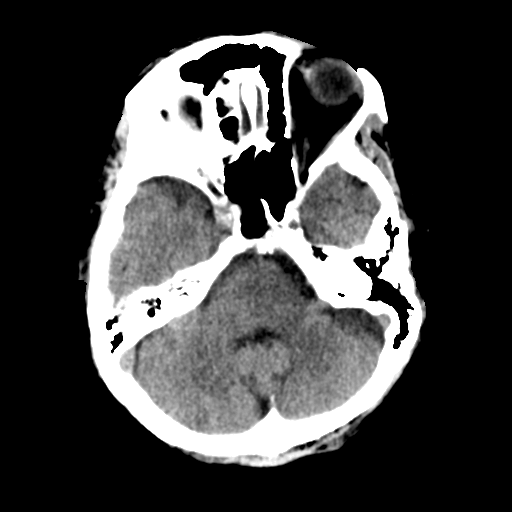
[im 10/31  brain]
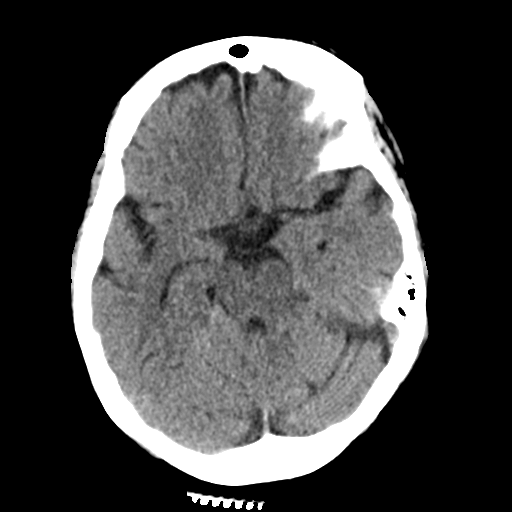
[im 12/31  brain]
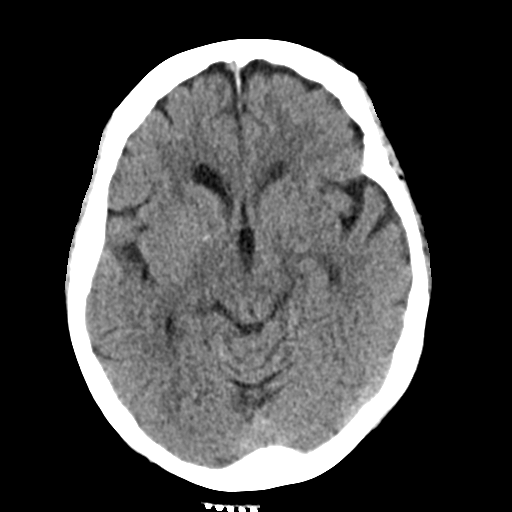
[im 17/31  brain]
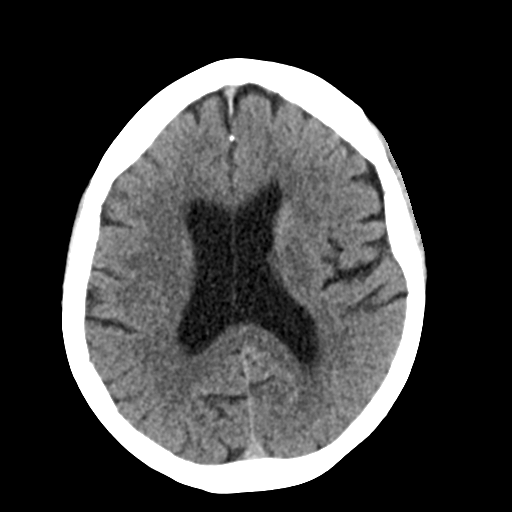
[im 17/31  bone]
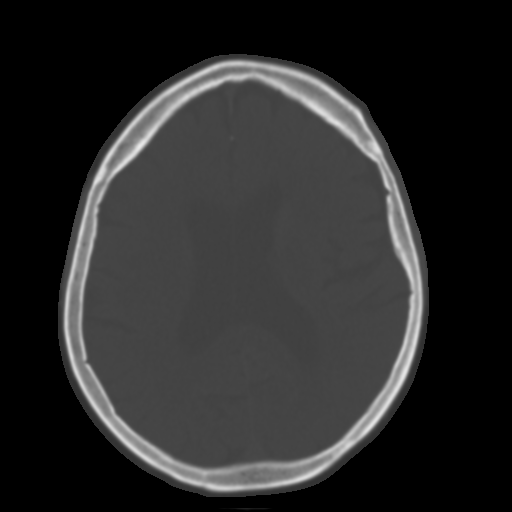
[im 19/31  brain]
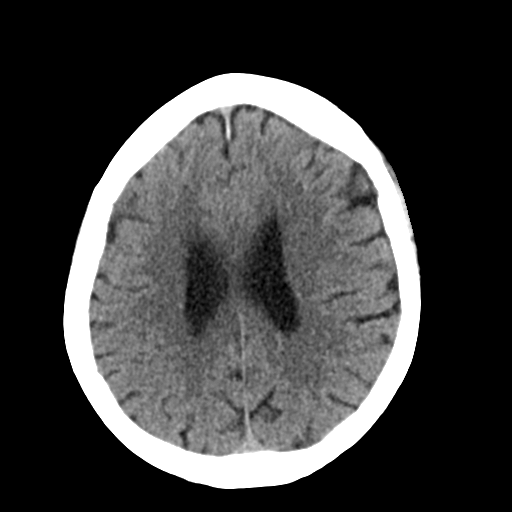
[im 21/31  brain]
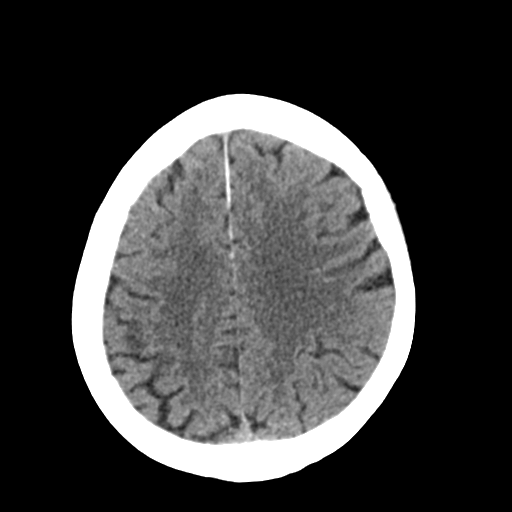
[im 26/31  brain]
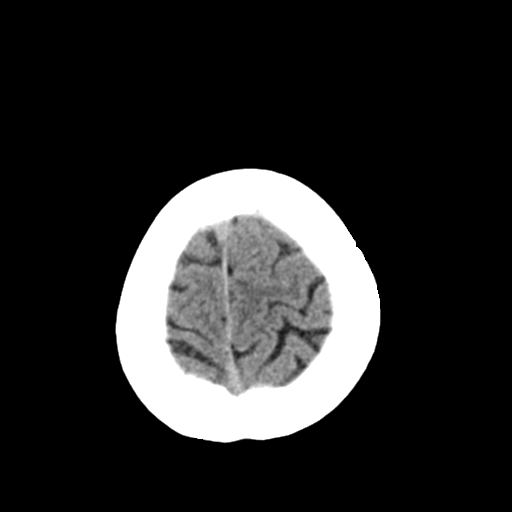
[im 28/31  brain]
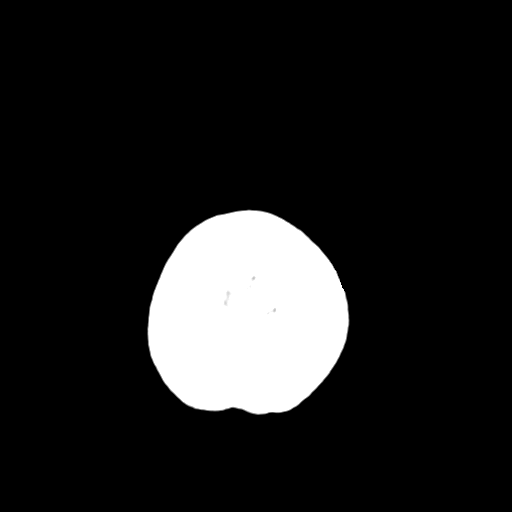
[im 28/31  bone]
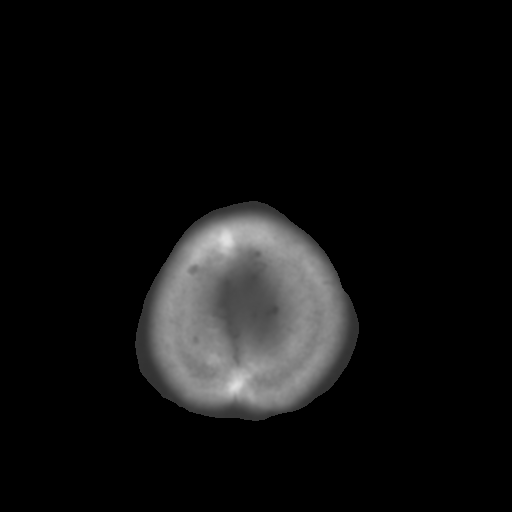

[Series 4: coronal soft tissue · coronal · 0.32mm/px · 3 of 66 slices shown]
[im 22/66  brain]
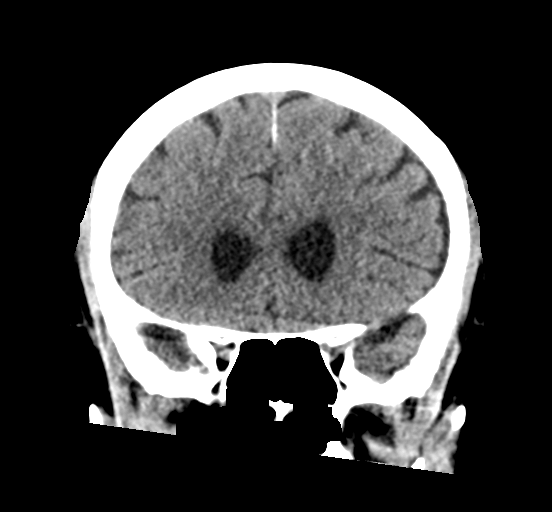
[im 29/66  brain]
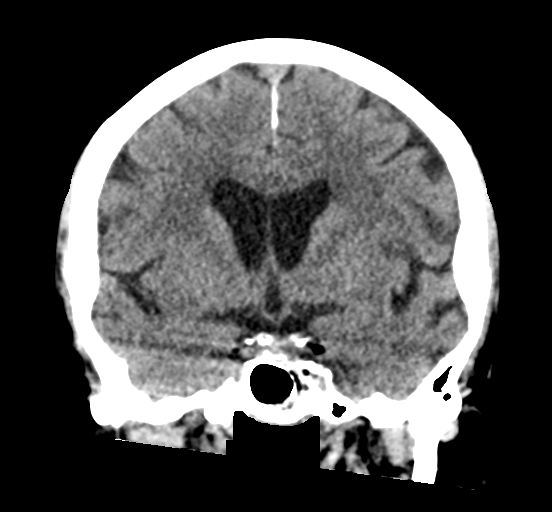
[im 37/66  brain]
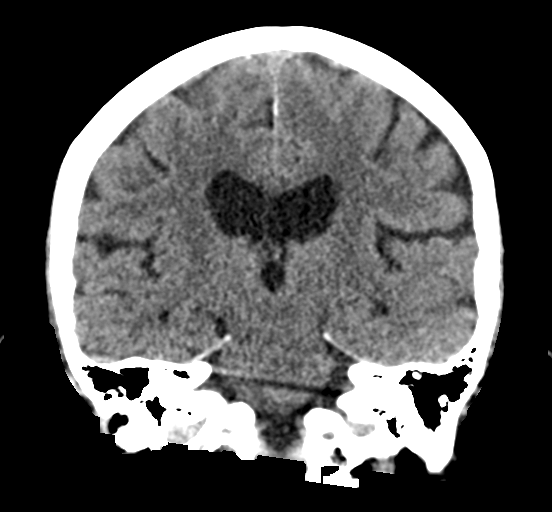

[Series 5: sagittal soft tissue · sagittal · 0.32mm/px · 3 of 60 slices shown]
[im 23/60  brain]
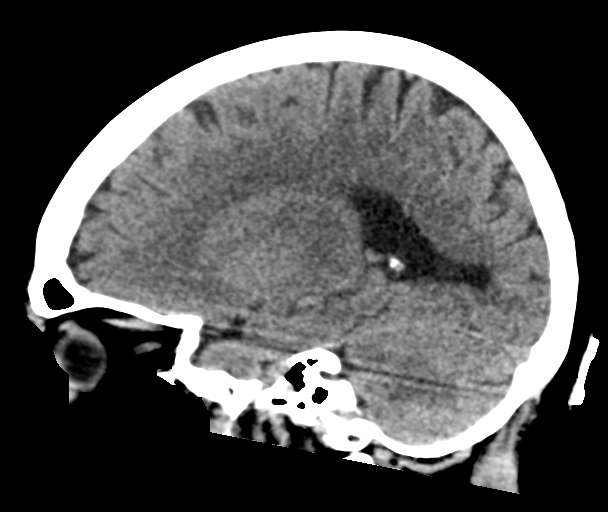
[im 30/60  brain]
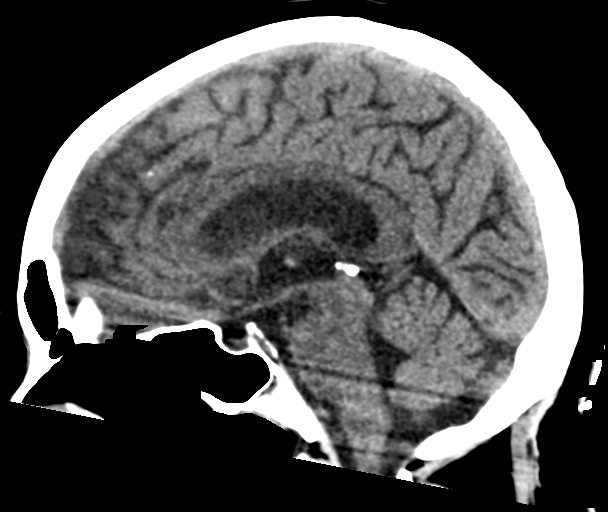
[im 38/60  brain]
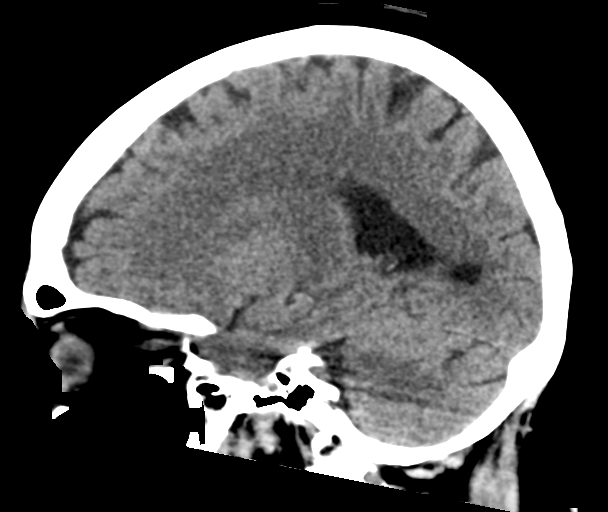

[15 of 47 positions shown; findings below may reference images not displayed]

FINDINGS: Brain: There is mild age related volume loss. There is no
intracranial mass, hemorrhage, extra-axial fluid collection, or
midline shift.

Decreased attenuation is noted in the mid right cerebellum slightly
lateral and inferior to the dentate nucleus. This area is concerning
for an acute infarct in the mid right cerebellum. There is evidence
of a prior small lacunar infarct in the superior left cerebellum.
Elsewhere there is minimal small vessel disease in the centra
semiovale bilaterally.

Vascular: No hyperdense vessel. No appreciable vascular
calcification noted.

Skull: The bony calvarium appears intact.

Sinuses/Orbits: There is slight mucosal thickening in several
ethmoid air cells. Other visualized paranasal sinuses are clear.
Orbits appear symmetric bilaterally.

Other: Mastoid air cells are clear.
IMPRESSION: Decreased attenuation in the right cerebellum slightly lateral and
inferior to the dentate nucleus, suspicious for acute infarct
focally in this area.

2.  Prior small lacunar infarct in the upper left cerebellum.

3. Slight periventricular small vessel disease. No acute appearing
supratentorial infarct evident on this study. No mass or hemorrhage.

4.  Mild mucosal thickening in several ethmoid air cells.

## 2020-03-30 IMAGING — MR MR HEAD W/O CM
11 series · 44 of 48 positions shown · non-contrast
Comparison: Head CT [DATE]

Brain MRI [DATE]

CLINICAL DATA: Right-sided numbness

EXAM:
MRI HEAD WITHOUT CONTRAST
TECHNIQUE: Multiplanar, multiecho pulse sequences of the brain and surrounding
structures were obtained without intravenous contrast.

[Series 9: ax dwi_tracew · axial · 3.0mm · 0.60mm/px · z∈[-91,+60]mm · 4 of 48 slices shown]
[im 1/48]
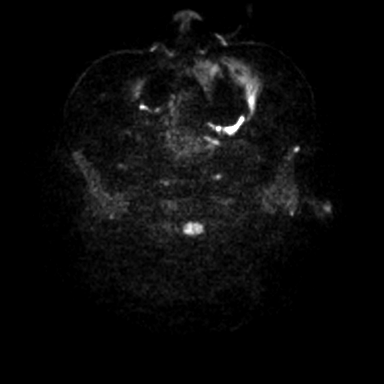
[im 16/48]
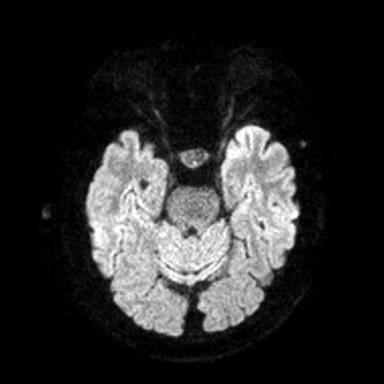
[im 32/48]
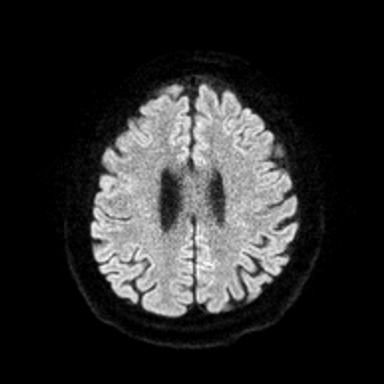
[im 48/48]
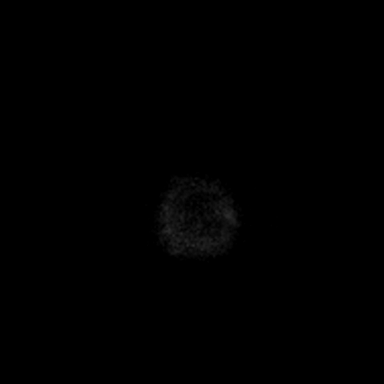

[Series 10: ax dwi_adc · axial · 3.0mm · 0.60mm/px · z∈[-91,+60]mm · 4 of 48 slices shown]
[im 1/48]
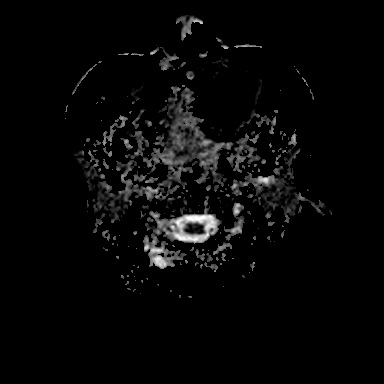
[im 16/48]
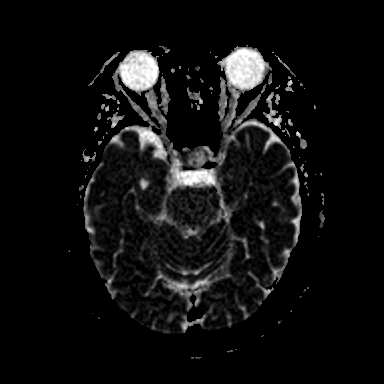
[im 32/48]
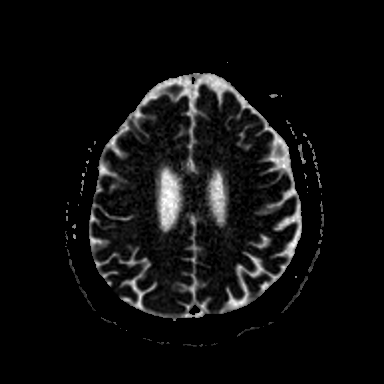
[im 48/48]
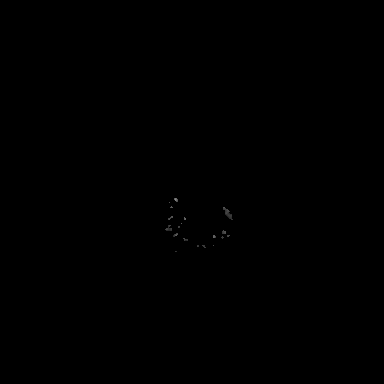

[Series 11: cor dwi_tracew · coronal · 5.0mm · 0.60mm/px · 3 of 40 slices shown]
[im 1/40]
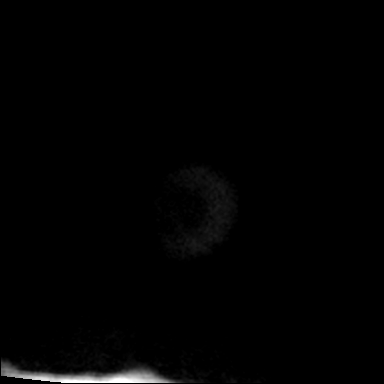
[im 20/40]
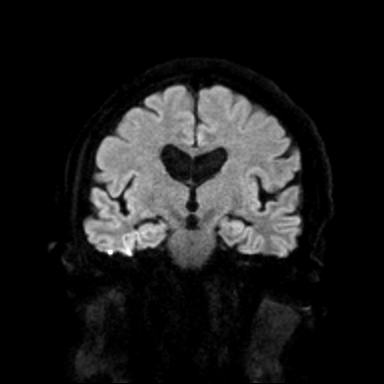
[im 40/40]
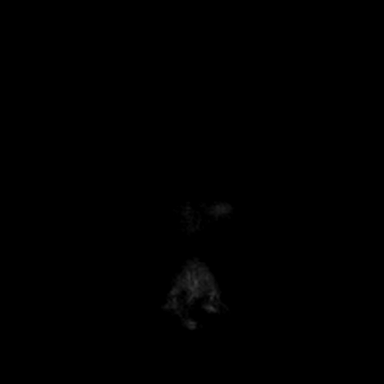

[Series 12: cor dwi_adc · coronal · 5.0mm · 0.60mm/px · 3 of 40 slices shown]
[im 1/40]
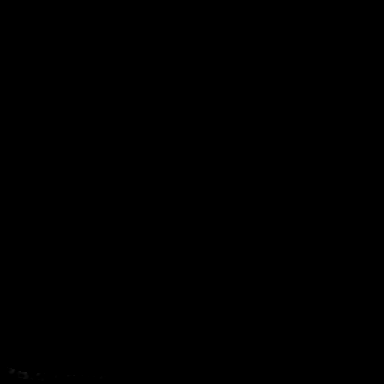
[im 20/40]
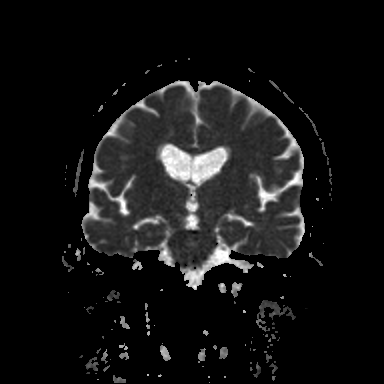
[im 40/40]
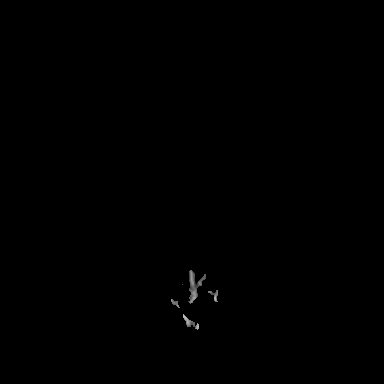

[Series 13: T1 · sagittal · 5.0mm · 0.62mm/px · 2 of 25 slices shown (1 of 2)]
[im 1/25]
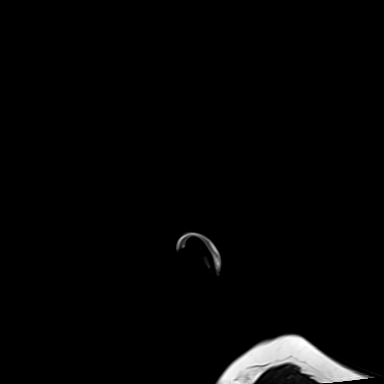
[im 25/25]
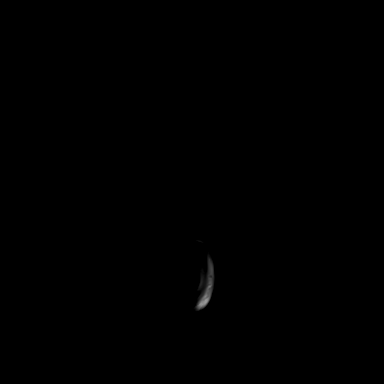

[Series 14: T2 · axial · 5.0mm · 0.53mm/px · z∈[-85,+54]mm · 2 of 25 slices shown (1 of 2)]
[im 1/25]
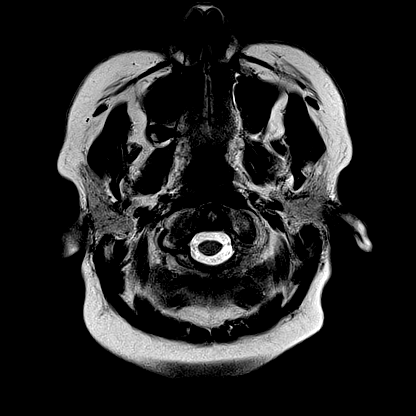
[im 25/25]
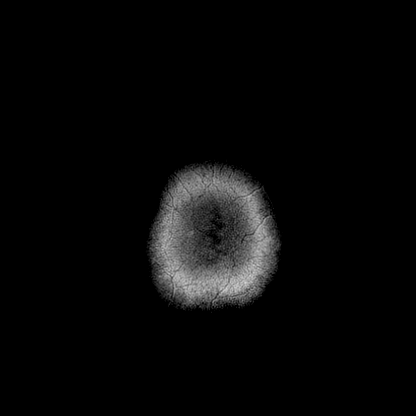

[Series 16: pha_images · axial · 3.0mm · 0.90mm/px · z∈[-100,+72]mm · 5 of 60 slices shown]
[im 1/60]
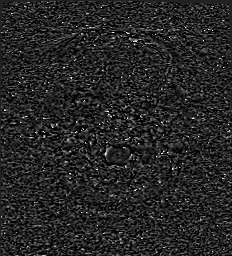
[im 15/60]
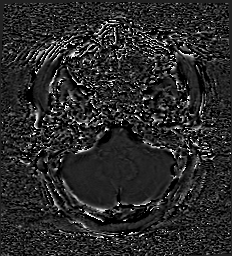
[im 30/60]
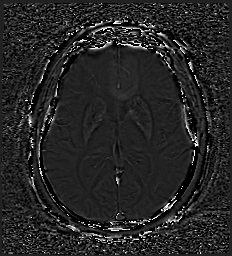
[im 45/60]
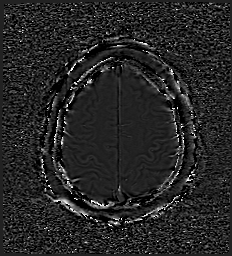
[im 60/60]
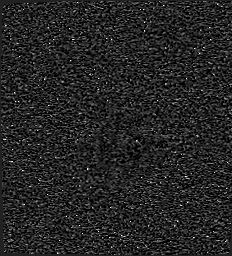

[Series 17: swi_images · axial · 3.0mm · 0.90mm/px · z∈[-100,+72]mm · 5 of 60 slices shown]
[im 1/60]
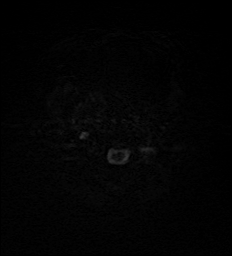
[im 15/60]
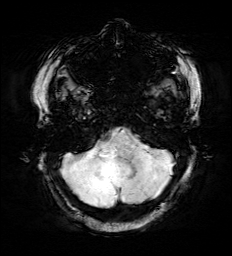
[im 30/60]
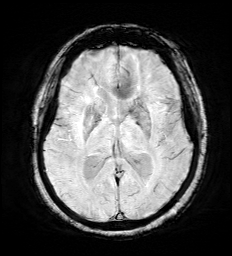
[im 45/60]
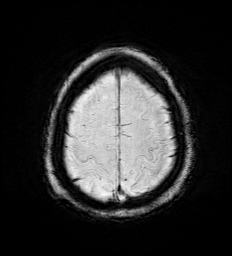
[im 60/60]
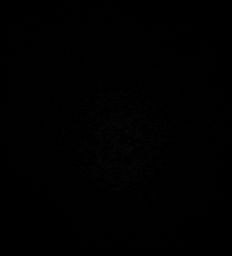

[Series 19: FLAIR · axial · 3.0mm · 0.53mm/px · z∈[-94,+63]mm · 4 of 55 slices shown]
[im 1/55]
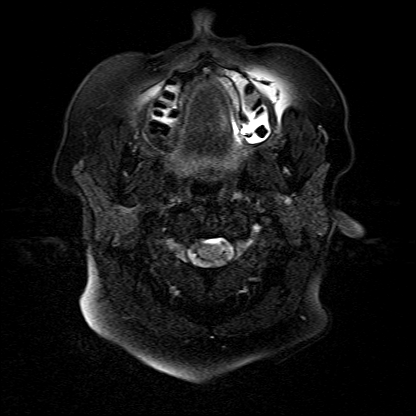
[im 19/55]
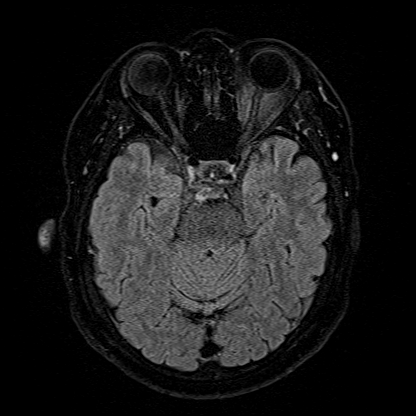
[im 37/55]
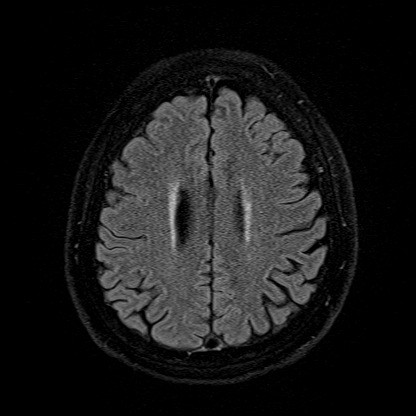
[im 55/55]
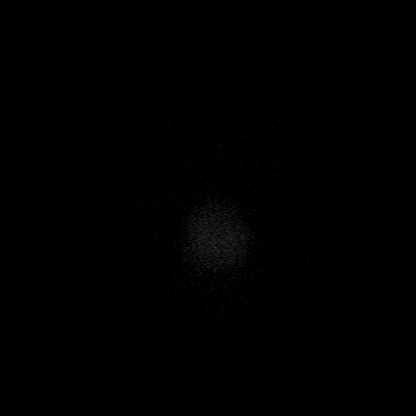

[Series 20: T1 · axial · 1.0mm · 0.98mm/px · z∈[-98,+71]mm · 10 of 176 slices shown (2 of 2)]
[im 1/176]
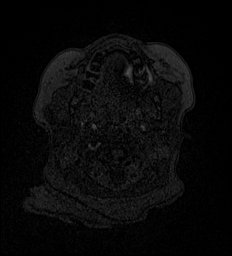
[im 14/176]
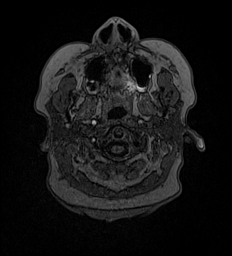
[im 27/176]
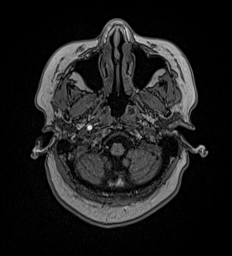
[im 41/176]
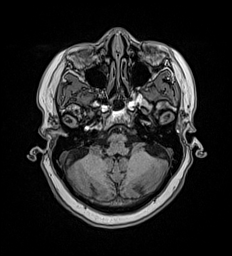
[im 54/176]
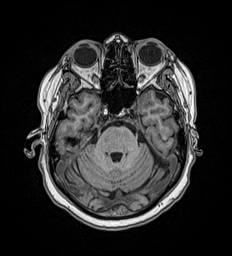
[im 81/176]
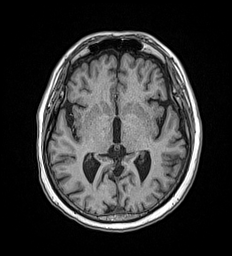
[im 95/176]
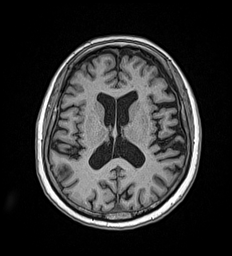
[im 122/176]
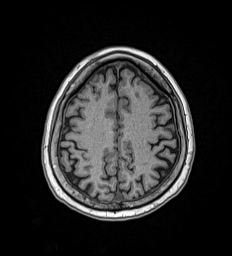
[im 149/176]
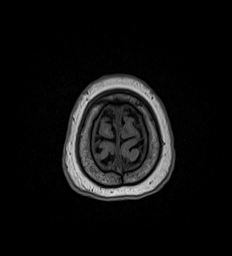
[im 176/176]
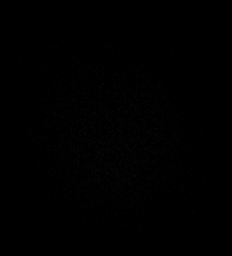

[Series 21: T2 · coronal · 5.0mm · 0.57mm/px · 2 of 29 slices shown (2 of 2)]
[im 1/29]
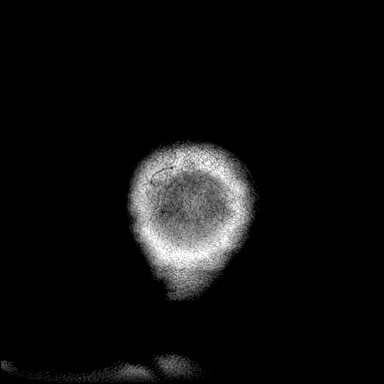
[im 29/29]
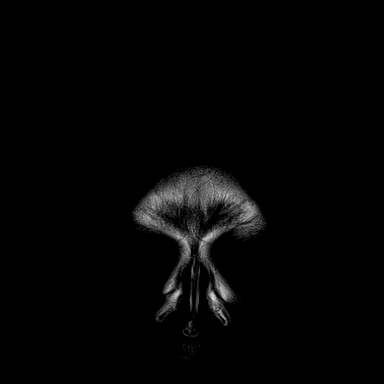

[44 of 48 positions shown; findings below may reference images not displayed]

FINDINGS: Brain: Punctate focus of abnormal diffusion restriction within the
dorsolateral left thalamus. Small, old left cerebellar infarct.
Multifocal white matter hyperintensity, most commonly due to chronic
ischemic microangiopathy. Normal volume of CSF spaces. No chronic
microhemorrhage. Normal midline structures.

Vascular: Normal flow voids.

Skull and upper cervical spine: Normal marrow signal.

Sinuses/Orbits: Negative.

Other: None.
IMPRESSION: 1. Punctate focus of acute ischemia within the dorsolateral left
thalamus. No hemorrhage or mass effect.
2. Small, old left cerebellar infarct and findings of chronic
ischemic microangiopathy.

## 2020-03-30 MED ORDER — CLOPIDOGREL BISULFATE 75 MG PO TABS
75.0000 mg | ORAL_TABLET | Freq: Every day | ORAL | Status: DC
  Administered 2020-03-30 – 2020-03-31 (×2): 75 mg via ORAL
  Filled 2020-03-30 (×2): qty 1

## 2020-03-30 MED ORDER — ACETAMINOPHEN 325 MG PO TABS: 650.0000 mg | ORAL_TABLET | ORAL | Status: DC | PRN

## 2020-03-30 MED ORDER — LURASIDONE HCL 40 MG PO TABS
40.0000 mg | ORAL_TABLET | Freq: Every day | ORAL | Status: DC
  Administered 2020-03-31: 40 mg via ORAL
  Filled 2020-03-30 (×2): qty 1

## 2020-03-30 MED ORDER — ATORVASTATIN CALCIUM 20 MG PO TABS: 80.0000 mg | ORAL_TABLET | Freq: Every day | ORAL | Status: DC

## 2020-03-30 MED ORDER — STROKE: EARLY STAGES OF RECOVERY BOOK: Freq: Once | Status: DC

## 2020-03-30 MED ORDER — INSULIN ASPART 100 UNIT/ML ~~LOC~~ SOLN
0.0000 [IU] | Freq: Three times a day (TID) | SUBCUTANEOUS | Status: DC
  Administered 2020-03-31: 2 [IU] via SUBCUTANEOUS
  Filled 2020-03-30: qty 1

## 2020-03-30 MED ORDER — ACETAMINOPHEN 650 MG RE SUPP: 650.0000 mg | RECTAL | Status: DC | PRN

## 2020-03-30 MED ORDER — SODIUM CHLORIDE 0.9 % IV SOLN: INTRAVENOUS | Status: DC

## 2020-03-30 MED ORDER — LATANOPROST 0.005 % OP SOLN
1.0000 [drp] | Freq: Every day | OPHTHALMIC | Status: DC
  Administered 2020-03-30: 1 [drp] via OPHTHALMIC
  Filled 2020-03-30: qty 2.5

## 2020-03-30 MED ORDER — ACETAMINOPHEN 160 MG/5ML PO SOLN
650.0000 mg | ORAL | Status: DC | PRN
  Filled 2020-03-30: qty 20.3

## 2020-03-30 MED ORDER — DORZOLAMIDE HCL-TIMOLOL MAL 2-0.5 % OP SOLN
1.0000 [drp] | Freq: Two times a day (BID) | OPHTHALMIC | Status: DC
  Administered 2020-03-31: 1 [drp] via OPHTHALMIC
  Filled 2020-03-30: qty 10

## 2020-03-30 MED ORDER — OLANZAPINE 5 MG PO TABS
5.0000 mg | ORAL_TABLET | Freq: Every day | ORAL | Status: DC
  Administered 2020-03-30: 5 mg via ORAL
  Filled 2020-03-30: qty 1

## 2020-03-30 MED ORDER — CARVEDILOL 6.25 MG PO TABS
12.5000 mg | ORAL_TABLET | Freq: Two times a day (BID) | ORAL | Status: DC
  Administered 2020-03-31: 12.5 mg via ORAL
  Filled 2020-03-30: qty 2

## 2020-03-30 MED ORDER — ASPIRIN EC 81 MG PO TBEC
81.0000 mg | DELAYED_RELEASE_TABLET | Freq: Every day | ORAL | Status: DC
  Administered 2020-03-30 – 2020-03-31 (×2): 81 mg via ORAL
  Filled 2020-03-30 (×2): qty 1

## 2020-03-30 NOTE — ED Notes (Signed)
PT being screened for MRI

## 2020-03-30 NOTE — ED Triage Notes (Signed)
First nurse note- right side numbness.  LKW Saturday/sunday. VSS with EMS.  CBG 166

## 2020-03-30 NOTE — H&P (Signed)
History and Physical    Melanie Ray BPZ:[id number] DOB: 1951/07/14 DOA: 03/30/2020   PCP: Leone Haven, MD   Outpatient Specialists: Dr.Cooper-Cardiology  Patient coming from: Home  Chief Complaint:  Rt face/ arm/ torso tingling since sat   HPI: Melanie Ray is a 69 y.o. female with medical history significant of HTN,CAD/ MI,DM II,Dyslipidemia seen in ed for stroke.  Patient reports that Friday she was told she had a cavity while at her routine dental cleaning appointment.  On Saturday patient started to have tingling affecting the right side of her face the right side of her arm and upper extremity extending to the fingers the right side of her breast chest wall and abdomen.  She thought that since the cavity was so close to the nerve it is probably the cavity that is causing the tingling and she called the dentist.  The dentist office told the patient that it sounds like she is having a stroke and that she should be evaluated, at that time the patient went to the urgent care and was told the same thing and EMS was called and asked patient to bring to the emergency room for evaluation of acute CVA.  Patient also reports that today while in the waiting area she was having trouble with her balance and her spouse had to catch her. Patient denies any chest pain but has noticed some dizziness and shortness of breath. Otherwise denies any symptoms.  She states that she is compliant with all her medications and has taken all her medications today.  Patient states that she is never had a stroke.  She does state that she has had evaluation by a neurologist for dizziness off and on in the past. Patient denies any dysarthria slurred speech, diplopia, blurred vision, any visual issues, patient also denies any weakness or paresis or paralysis any urinary incontinence or any other neurological symptoms.  Her gait disturbance was noticed in the emergency room and untested during exam due to fall  risk.    ED Course:  In the emergency room patient is alert awake oriented and gives history.  Vitals as below. Blood pressure (!) 148/69, pulse 70, temperature 97.9 F (36.6 C), temperature source Oral, resp. rate (!) 24, height _0  (1.6 m), weight 75.8 kg, SpO2 97 %. Labs are within normal limits with a hemoglobin of 11.9.  EKG is normal sinus rhythm with a rate of 69 and a left bundle branch block which was similar to prior EKG, QTC of 495. Initial head CT that was done showed an acute stroke in the right cerebellum.   Review of Systems: As per HPI otherwise 10 point review of systems negative.   Past Medical History:  Diagnosis Date  . Anemia   . Anxiety   . Back pain   . CAD (coronary artery disease) 04/11/2016   S/p ant STEMI 5/17: LHC >> LAD proximal 80%, mid 80%, distal 50%, ostial D1 60%; LCx with LPDA lesion 30%; RCA Mild calcification with no significant stenosis in a medium caliber, nondominant RCA; LVEF is estimated at 45% with inferoapical and lateral wall akinesis >> PCI: PCI: 3.5 x 24 mm Promus DES to prox LAD, 2.5 x 12 mm Promus DES to mid LAD.   [name] Chest pain   . Chronic systolic CHF (congestive heart failure) ([place]) 03/21/2016   Echo 01/30/17: Diff HK, mild focal basal septal hypertrophy, EF 30-35, mild AI, MAC, mild MR // Echo 06/08/16: Mild focal basal septal hypertrophy, EF 25-30%,  diff HK, ant-septal AK, Gr 1 DD, mild AI, MAC, mild MR, PASP 37 mmHg // Echo 03/18/16: EF 30-35%, ant-septal AK, Gr 1 DD, mild MR, severe LAE.  [name] Constipation   . Depression   . Diabetes mellitus type 2 in obese ([place])   . Dizziness   . Glaucoma   . Gout   . Heart disease   . Heartburn   . History of acute anterior wall MI 03/17/2016  . History of heart attack   . Hyperlipemia   . Hypertension   . Ischemic cardiomyopathy 10/02/2016   Refused ICD  . Joint pain   . Left bundle branch block   . Myocardial infarction ([place])   . Nausea   . Sleep apnea   . SOB (shortness of breath)   .  Suicidal ideation 08/27/2018  . Swelling    feet or legs    Past Surgical History:  Procedure Laterality Date  . APPENDECTOMY    . CARDIAC CATHETERIZATION N/A 03/17/2016   Procedure: Left Heart Cath and Coronary Angiography;  Surgeon: Sherren Mocha, MD;  Location: [facility];  Service: Cardiovascular;  Laterality: N/A;  . CARDIAC CATHETERIZATION N/A 03/17/2016   Procedure: Coronary Stent Intervention;  Surgeon: Sherren Mocha, MD;  Location: [facility];  Service: Cardiovascular;  Laterality: N/A;  . COLONOSCOPY WITH PROPOFOL N/A 08/13/2017   Procedure: COLONOSCOPY WITH PROPOFOL;  Surgeon: Jonathon Bellows, MD;  Location: [facility] ENDOSCOPY;  Service: Gastroenterology;  Laterality: N/A;  . COLONOSCOPY WITH PROPOFOL N/A 03/29/2020   Procedure: COLONOSCOPY WITH PROPOFOL;  Surgeon: Lucilla Lame, MD;  Location: [facility]COPY;  Service: Endoscopy;  Laterality: N/A;  . SMALL BOWEL REPAIR    . TONSILLECTOMY    . UTERINE FIBROID SURGERY       reports that she quit smoking about 22 years ago. Her smoking use included cigarettes. She has never used smokeless tobacco. She reports current alcohol use of about 1.0 standard drinks of alcohol per week. She reports that she does not use drugs.  Allergies  Allergen Reactions  . Tetracycline Swelling  . Meperidine Nausea And Vomiting    Nausea    Family History  Problem Relation Age of Onset  . Anxiety disorder Mother   . Paranoid behavior Mother   . Hypertension Mother   . Heart failure Mother   . Stroke Mother   . Dementia Mother   . High Cholesterol Mother   . Diabetes Mother   . Hyperlipidemia Mother   . Heart disease Mother   . Depression Mother   . Hypertension Father   . High Cholesterol Father   . Mood Disorder Sister   . Stroke Sister   . Anxiety disorder Maternal Aunt   . Drug abuse Cousin   . Tuberculosis Paternal Grandfather     Prior to Admission medications   Medication Sig Start Date End Date Taking? Authorizing  Provider  aspirin EC 81 MG EC tablet Take 1 tablet (81 mg total) by mouth daily. 03/21/16   Lyda Jester M, PA-C  atorvastatin (LIPITOR) 80 MG tablet Take 1 tablet by mouth once daily 02/19/20   Richardson Dopp T, PA-C  blood glucose meter kit and supplies KIT Dispense based on patient and insurance preference.  Check glucose once daily fasting in the morning. (FOR ICD 10 E11.9). 10/16/19   Leone Haven, MD  carvedilol (COREG) 12.5 MG tablet TAKE 1 TABLET BY MOUTH TWICE DAILY WITH MEALS 03/23/20   Richardson Dopp T, PA-C  Cyanocobalamin (VITAMIN  B-12 PO) Take 1 capsule by mouth daily.    [provider]  dorzolamide-timolol (COSOPT) 22.3-6.8 MG/ML ophthalmic solution Place 1 drop into both eyes 2 (two) times daily.  01/17/15   [provider]  furosemide (LASIX) 40 MG tablet Take 1 tablet (40 mg total) by mouth as directed. 40 mg on Mon, Wed and Fri's; all other days 20 mg 12/30/18 02/17/20  Richardson Dopp T, PA-C  isosorbide mononitrate (IMDUR) 30 MG 24 hr tablet Take 1 tablet (30 mg total) by mouth daily. Patient not taking: Reported on 03/09/2020 01/06/20 01/05/21  Sherren Mocha, MD  LATUDA 40 MG TABS tablet Take 40 mg by mouth daily with breakfast.  04/28/19   [provider]  metFORMIN (GLUCOPHAGE) 500 MG tablet TAKE 1 TABLET BY MOUTH TWICE DAILY WITH  A  MEAL 02/01/20   Leone Haven, MD  Multiple Vitamin (MULTI-VITAMIN DAILY) TABS Take 1 tablet by mouth daily.    [provider]  nitroGLYCERIN (NITROSTAT) 0.4 MG SL tablet Place 1 tablet (0.4 mg total) under the tongue every 5 (five) minutes x 3 doses as needed for chest pain (Do not exceed 3 doses). 12/30/18   Richardson Dopp T, PA-C  OLANZapine (ZYPREXA) 5 MG tablet Take 1 tablet (5 mg total) by mouth at bedtime. For anxiety and sleep 01/05/19   Ursula Alert, MD  olmesartan (BENICAR) 20 MG tablet Take 20 mg by mouth daily.    [provider]  ondansetron (ZOFRAN) 4 MG tablet TAKE 1 TABLET BY MOUTH  ONCE DAILY AS NEEDED FOR NAUSEA AND VOMITING Patient not taking: Reported on 03/29/2020 05/29/18   Leone Haven, MD  spironolactone (ALDACTONE) 25 MG tablet Take 1/2 (one-half) tablet by mouth once daily 03/07/20   Sherren Mocha, MD  traZODone (DESYREL) 100 MG tablet Take 100 mg by mouth at bedtime as needed for sleep.    [provider]  venlafaxine XR (EFFEXOR-XR) 150 MG 24 hr capsule TAKE 2 CAPSULES BY MOUTH ONCE DAILY (NEEDS  APPOINTMENT  FOR  FURTHER  REFILLS) 03/13/19   Ursula Alert, MD    Physical Exam: Vitals:   03/30/20 1611 03/30/20 1700 03/30/20 1730 03/30/20 1800  BP: (!) 133/55 135/63 139/68 (!) 148/69  Pulse: 78 70 67 70  Resp: 18 (!) 23 (!) 22 (!) 24  Temp: 97.9 F (36.6 C)     TempSrc: Oral     SpO2: 97% 97% 98% 97%  Weight:      Height:        Constitutional: NAD, calm, comfortable no facial asymmetry noted Vitals:   03/30/20 1611 03/30/20 1700 03/30/20 1730 03/30/20 1800  BP: (!) 133/55 135/63 139/68 (!) 148/69  Pulse: 78 70 67 70  Resp: 18 (!) 23 (!) 22 (!) 24  Temp: 97.9 F (36.6 C)     TempSrc: Oral     SpO2: 97% 97% 98% 97%  Weight:      Height:       Eyes: PERRL, extraocular movements intact, no nystagmus, bilateral lens implant in place, lids and conjunctivae normal. ENMT: Mucous membranes are moist. Posterior pharynx clear of any exudate or lesions.Normal dentition.  No tongue deviation. Neck: normal, supple, no masses, no thyromegaly Respiratory: clear to auscultation bilaterally, no wheezing, no crackles. Normal respiratory effort. No accessory muscle use.  Cardiovascular: Regular rate and rhythm, 2+ systolic murmur in aortic area, No extremity edema. 2+ pedal pulses. No carotid bruits.  Abdomen: no tenderness, no masses palpated. No hepatosplenomegaly. Bowel sounds positive.  Musculoskeletal: no clubbing / cyanosis. No joint deformity upper and lower extremities. Good ROM, no contractures. Normal muscle tone.  Skin: no rashes,  lesions, ulcers. No induration Neurologic: CN 2-12 grossly intact. Sensation intact, DTR normal. Strength 5/5 in all 4.  Finger nose test normal limits.  Psychiatric: Normal judgment and insight. Alert and oriented x 3. Normal mood.   Labs on Admission: I have personally reviewed following labs and imaging studies  CBC: Recent Labs  Lab 03/30/20 1236  WBC 6.6  NEUTROABS 3.6  HGB 11.9*  HCT 36.4  MCV 80.4  PLT 027   Basic Metabolic Panel: Recent Labs  Lab 03/30/20 1236  NA 137  K 4.0  CL 105  CO2 24  GLUCOSE 90  BUN 19  CREATININE 0.94  CALCIUM 9.7   GFR: Estimated Creatinine Clearance: 55.1 mL/min (by C-G formula based on SCr of 0.94 mg/dL). Liver Function Tests: Recent Labs  Lab 03/30/20 1236  AST 18  ALT 18  ALKPHOS 123  BILITOT 0.6  PROT 7.5  ALBUMIN 4.1   No results for input(s): LIPASE, AMYLASE in the last 168 hours. No results for input(s): AMMONIA in the last 168 hours. Coagulation Profile: Recent Labs  Lab 03/30/20 1236  INR 1.0   Cardiac Enzymes: No results for input(s): CKTOTAL, CKMB, CKMBINDEX, TROPONINI in the last 168 hours. BNP (last 3 results) No results for input(s): PROBNP in the last 8760 hours. HbA1C: No results for input(s): HGBA1C in the last 72 hours. CBG: Recent Labs  Lab 03/29/20 0723  GLUCAP 129*   Lipid Profile: No results for input(s): CHOL, HDL, LDLCALC, TRIG, CHOLHDL, LDLDIRECT in the last 72 hours. Thyroid Function Tests: No results for input(s): TSH, T4TOTAL, FREET4, T3FREE, THYROIDAB in the last 72 hours. Anemia Panel: No results for input(s): VITAMINB12, FOLATE, FERRITIN, TIBC, IRON, RETICCTPCT in the last 72 hours. Urine analysis:    Component Value Date/Time   COLORURINE YELLOW (A) 11/22/2016 1138   APPEARANCEUR CLEAR (A) 11/22/2016 1138   LABSPEC 1.019 11/22/2016 1138   PHURINE 5.0 11/22/2016 1138   GLUCOSEU NEGATIVE 11/22/2016 1138   HGBUR NEGATIVE 11/22/2016 1138   BILIRUBINUR NEGATIVE 11/22/2016 1138     KETONESUR NEGATIVE 11/22/2016 1138   PROTEINUR NEGATIVE 11/22/2016 1138   NITRITE NEGATIVE 11/22/2016 1138   LEUKOCYTESUR NEGATIVE 11/22/2016 1138    Recent Results (from the past 240 hour(s))  SARS CORONAVIRUS 2 (TAT 6-24 HRS) Nasopharyngeal Nasopharyngeal Swab     Status: None   Collection Time: 03/25/20 11:13 AM   Specimen: Nasopharyngeal Swab  Result Value Ref Range Status   SARS Coronavirus 2 NEGATIVE NEGATIVE Final    Comment: (NOTE) SARS-CoV-2 target nucleic acids are NOT DETECTED. The SARS-CoV-2 RNA is generally detectable in upper and lower respiratory specimens during the acute phase of infection. Negative results do not preclude SARS-CoV-2 infection, do not rule out co-infections with other pathogens, and should not be used as the sole basis for treatment or other patient management decisions. Negative results must be combined with clinical observations, patient history, and epidemiological information. The expected result is Negative. Fact Sheet for Patients: SugarRoll.be Fact Sheet for Healthcare Providers: https://www.woods-mathews.com/ This test is not yet approved or cleared by the [place] FDA and  has been authorized for detection and/or diagnosis of SARS-CoV-2 by FDA under an Emergency Use Authorization (EUA). This EUA will remain  in effect (meaning this test can be used) for the duration of the COVID-19 declaration under Section 56 4(b)(1) of the Act, 21 U.S.C.  section 360bbb-3(b)(1), unless the authorization is terminated or revoked sooner. Performed at [facility], [place] [address] [place], [place] [address]      Radiological Exams on Admission: CT HEAD WO CONTRAST  Result Date: 03/30/2020 CLINICAL DATA:  Right-sided facial, upper extremity, and lower extremity numbness EXAM: CT HEAD WITHOUT CONTRAST TECHNIQUE: Contiguous axial images were obtained from the base of the skull through the vertex  without intravenous contrast. COMPARISON:  None. FINDINGS: Brain: There is mild age related volume loss. There is no intracranial mass, hemorrhage, extra-axial fluid collection, or midline shift. Decreased attenuation is noted in the mid right cerebellum slightly lateral and inferior to the dentate nucleus. This area is concerning for an acute infarct in the mid right cerebellum. There is evidence of a prior small lacunar infarct in the superior left cerebellum. Elsewhere there is minimal small vessel disease in the centra semiovale bilaterally. Vascular: No hyperdense vessel. No appreciable vascular calcification noted. Skull: The bony calvarium appears intact. Sinuses/Orbits: There is slight mucosal thickening in several ethmoid air cells. Other visualized paranasal sinuses are clear. Orbits appear symmetric bilaterally. Other: Mastoid air cells are clear. IMPRESSION: Decreased attenuation in the right cerebellum slightly lateral and inferior to the dentate nucleus, suspicious for acute infarct focally in this area. 2.  Prior small lacunar infarct in the upper left cerebellum. 3. Slight periventricular small vessel disease. No acute appearing supratentorial infarct evident on this study. No mass or hemorrhage. 4.  Mild mucosal thickening in several ethmoid air cells. Electronically Signed   By: Lowella Grip III M.D.   On: 03/30/2020 13:29    EKG: NSR at 69, QTc 495, left bundle branch block.  Assessment/Plan Active Problems:   Essential hypertension   HLD (hyperlipidemia)   Glaucoma   Depression   Anxiety   DM type 2 (diabetes mellitus, type 2) ([facility])   History of acute anterior wall MI   Chronic systolic CHF (congestive heart failure) ([facility])   CAD (coronary artery disease)  Paresthesia affecting right side of the face/right upper extremity/right torso: -Attribute to right cerebellar infarct. -Start Plavix 75 mg along with aspirin 81 for dual antiplatelet therapy, along with high intensity  statin therapy with Lipitor at 80 mg continued. -Home regimen of antihypertensive therapy held, blood pressure today is well controlled with systolic in 604V as patient has taken her home meds today morning. For now all her antihypertensive and diuretic treatment has been held and is to be restarted As appropriate with systolic goals of 409W for permissive hypertension. -Bedside swallow evaluation, PT OT. -MRI of the brain noncontrast, carotid Dopplers, echocardiogram with bubble study. -We will cycle cardiac enzymes as patient has been having some shortness of breath as well.  Hypertension: -Home regimen of Coreg continued. -Lasix 40 mg, Imdur 30 mg, Benicar 20 mg, Aldactone 25 mg all held. -We will follow blood pressure closely to restart home medications, however again permissive hypertension at least for the first 48 hours.  Hyperlipidemia: -A.m. lipid panel, patient continued on home regimen of atorvastatin 80 mg.   Glaucoma: -Patient continued on her combination eyedrops for glaucoma Cosopt 1 drop in both eyes twice daily.  Depression/anxiety: -Patient continued on home regimen of Zyprexa 5 mg, Latuda 40 mg.  Diabetes mellitus type 2: -Sliding scale insulin, Accu-Cheks, A1c, hypoglycemia protocol. -Home regimen of Metformin currently held.  CAD/history of anterior wall MI/CHF: -A.m. lipid panel, and continued on aspirin started on Plavix 75 continued on Coreg 12.5 twice daily. -Home regimen of Imdur  Aldactone and Benicar to be restarted tomorrow.    DVT prophylaxis: ASA/ Plavix.  Code Status: Full  Family Communication:  Elberta Fortis [phone] Disposition Plan: Home Consults called:N/A Admission status:  Observation.  Para Skeans MD [facility] If 7PM-7AM, please contact night-coverage [email or web address] Password [facility] 03/30/2020, 6:59 PM

## 2020-03-30 NOTE — ED Provider Notes (Signed)
[facility] Emergency Department Provider Note  ____________________________________________   First MD Initiated Contact with Patient 03/30/20 1645     (approximate)  I have reviewed the triage vital signs and the nursing notes.   HISTORY  Chief Complaint Numbness     HPI Melanie Ray is a 69 y.o. female with extensive past medical history including hypertension, hyperlipidemia, coronary disease, on aspirin, here with right facial numbness and right arm numbness as well as some loss of balance since last Saturday/Sunday.  The patient states her symptoms began fairly acutely and have persisted since onset.  She initially noticed a numbness and tingling sensation along her right face, as well as right arm.  The symptoms have persisted.  She has also noticed some slight occasional difficulty finding words, loss of balance, and difficulty getting around.  She has had some mild headaches.  She denies any overt weakness in her arms or legs.  She denies any history of prior strokes.  Denies any known recent illnesses.  No vision changes.        Past Medical History:  Diagnosis Date  . Anemia   . Anxiety   . Back pain   . CAD (coronary artery disease) 04/11/2016   S/p ant STEMI 5/17: LHC >> LAD proximal 80%, mid 80%, distal 50%, ostial D1 60%; LCx with LPDA lesion 30%; RCA Mild calcification with no significant stenosis in a medium caliber, nondominant RCA; LVEF is estimated at 45% with inferoapical and lateral wall akinesis >> PCI: PCI: 3.5 x 24 mm Promus DES to prox LAD, 2.5 x 12 mm Promus DES to mid LAD.   [name] Chest pain   . Chronic systolic CHF (congestive heart failure) ([place]) 03/21/2016   Echo 01/30/17: Diff HK, mild focal basal septal hypertrophy, EF 30-35, mild AI, MAC, mild MR // Echo 06/08/16: Mild focal basal septal hypertrophy, EF 25-30%, diff HK, ant-septal AK, Gr 1 DD, mild AI, MAC, mild MR, PASP 37 mmHg // Echo 03/18/16: EF 30-35%, ant-septal AK, Gr 1 DD,  mild MR, severe LAE.  [name] Constipation   . Depression   . Diabetes mellitus type 2 in obese ([place])   . Dizziness   . Glaucoma   . Gout   . Heart disease   . Heartburn   . History of acute anterior wall MI 03/17/2016  . History of heart attack   . Hyperlipemia   . Hypertension   . Ischemic cardiomyopathy 10/02/2016   Refused ICD  . Joint pain   . Left bundle branch block   . Myocardial infarction ([place])   . Nausea   . Sleep apnea   . SOB (shortness of breath)   . Suicidal ideation 08/27/2018  . Swelling    feet or legs    Patient Active Problem List   Diagnosis Date Noted  . CVA (cerebrovascular accident) ([place]) 03/30/2020  . Positive colorectal cancer screening using Cologuard test 03/01/2020  . Nightmares 03/01/2020  . Left bundle branch block 12/03/2018  . QT prolongation 12/03/2018  . Obsessive compulsive disorder 08/27/2018  . Skin lesion 02/07/2018  . Hot flash, menopausal 02/07/2018  . Vitamin D deficiency 06/17/2017  . Class 1 obesity without serious comorbidity with body mass index (BMI) of 30.0 to 30.9 in adult 06/17/2017  . Hot flashes 04/23/2017  . Dizziness 01/03/2017  . Ischemic cardiomyopathy 10/02/2016  . CAD (coronary artery disease) 04/11/2016  . Chronic systolic CHF (congestive heart failure) ([place]) 03/21/2016  . History of acute anterior wall  MI 03/18/2016  . DM type 2 (diabetes mellitus, type 2) ([place]) 08/03/2015  . Essential hypertension 07/28/2015  . HLD (hyperlipidemia) 07/28/2015  . Glaucoma 07/28/2015  . Depression 07/28/2015  . Anxiety 07/28/2015  . Insomnia 07/28/2015  . Obesity (BMI 30.0-34.9) 07/28/2015  . 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 Polyp of colon 04/27/2008  . Uterine leiomyoma 04/27/2008  . Generalized anxiety disorder 04/27/2008  . History of colonoscopy 04/27/2008  . Osteopenia 04/27/2008  . Other specified disorder of bladder 04/27/2008  . Benign neoplasm of colon 04/27/2008    Past Surgical History:    Procedure Laterality Date  . APPENDECTOMY    . CARDIAC CATHETERIZATION N/A 03/17/2016   Procedure: Left Heart Cath and Coronary Angiography;  Surgeon: Sherren Mocha, MD;  Location: [place] [facility];  Service: Cardiovascular;  Laterality: N/A;  . CARDIAC CATHETERIZATION N/A 03/17/2016   Procedure: Coronary Stent Intervention;  Surgeon: Sherren Mocha, MD;  Location: [place] [facility];  Service: Cardiovascular;  Laterality: N/A;  . COLONOSCOPY WITH PROPOFOL N/A 08/13/2017   Procedure: COLONOSCOPY WITH PROPOFOL;  Surgeon: Jonathon Bellows, MD;  Location: [facility] ENDOSCOPY;  Service: Gastroenterology;  Laterality: N/A;  . COLONOSCOPY WITH PROPOFOL N/A 03/29/2020   Procedure: COLONOSCOPY WITH PROPOFOL;  Surgeon: Lucilla Lame, MD;  Location[facility] ENDOSCOPY;  Service: Endoscopy;  Laterality: N/A;  . SMALL BOWEL REPAIR    . TONSILLECTOMY    . UTERINE FIBROID SURGERY      Prior to Admission medications   Medication Sig Start Date End Date Taking? Authorizing Provider  aspirin EC 81 MG EC tablet Take 1 tablet (81 mg total) by mouth daily. 03/21/16  Yes Lyda Jester M, PA-C  atorvastatin (LIPITOR) 80 MG tablet Take 1 tablet by mouth once daily Patient taking differently: Take 80 mg by mouth daily.  02/19/20  Yes Weaver, Scott T, PA-C  carvedilol (COREG) 12.5 MG tablet TAKE 1 TABLET BY MOUTH TWICE DAILY WITH MEALS Patient taking differently: Take 12.5 mg by mouth 2 (two) times daily with a meal.  03/23/20  Yes Weaver, Scott T, PA-C  Cyanocobalamin (VITAMIN B-12 PO) Take 1 capsule by mouth daily.   Yes [provider]  dorzolamide-timolol (COSOPT) 22.3-6.8 MG/ML ophthalmic solution Place 1 drop into both eyes 2 (two) times daily.  01/17/15  Yes [provider]  isosorbide mononitrate (IMDUR) 30 MG 24 hr tablet Take 1 tablet (30 mg total) by mouth daily. 01/06/20 01/05/21 Yes Sherren Mocha, MD  latanoprost (XALATAN) 0.005 % ophthalmic solution Place 1 drop into both eyes at bedtime. 03/15/20   Yes [provider]  LATUDA 60 MG TABS Take 60 mg by mouth daily with breakfast.  04/28/19  Yes [provider]  LORazepam (ATIVAN) 1 MG tablet Take 1 mg by mouth at bedtime as needed for sleep.   Yes [provider]  metFORMIN (GLUCOPHAGE) 500 MG tablet TAKE 1 TABLET BY MOUTH TWICE DAILY WITH  A  MEAL Patient taking differently: Take 500 mg by mouth 2 (two) times daily with a meal.  02/01/20  Yes Leone Haven, MD  Multiple Vitamin (MULTI-VITAMIN DAILY) TABS Take 1 tablet by mouth daily.   Yes [provider]  olmesartan (BENICAR) 20 MG tablet Take 20 mg by mouth daily.   Yes [provider]  spironolactone (ALDACTONE) 25 MG tablet Take 1/2 (one-half) tablet by mouth once daily Patient taking differently: Take 12.5 mg by mouth daily. TAKE 1/2 (ONE-HALF) TABLET BY MOUTH ONCE DAILY 03/07/20  Yes Sherren Mocha, MD  traZODone (DESYREL) 100 MG tablet Take 100 mg by mouth at bedtime as needed for sleep.   Yes [provider]  venlafaxine XR (EFFEXOR-XR) 150 MG 24 hr capsule TAKE 2 CAPSULES BY MOUTH ONCE DAILY (NEEDS  APPOINTMENT  FOR  FURTHER  REFILLS) Patient taking differently: Take 300 mg by mouth daily with breakfast.  03/13/19  Yes Ursula Alert, MD  blood glucose meter kit and supplies KIT Dispense based on patient and insurance preference.  Check glucose once daily fasting in the morning. (FOR ICD 10 E11.9). 10/16/19   Leone Haven, MD  furosemide (LASIX) 40 MG tablet Take 1 tablet (40 mg total) by mouth as directed. 40 mg on Mon, Wed and Fri's; all other days 20 mg 12/30/18 02/17/20  Richardson Dopp T, PA-C  nitroGLYCERIN (NITROSTAT) 0.4 MG SL tablet Place 1 tablet (0.4 mg total) under the tongue every 5 (five) minutes x 3 doses as needed for chest pain (Do not exceed 3 doses). 12/30/18   Richardson Dopp T, PA-C  OLANZapine (ZYPREXA) 5 MG tablet Take 1 tablet (5 mg total) by mouth at bedtime. For anxiety and sleep Patient not taking: Reported  on 03/30/2020 01/05/19   Ursula Alert, MD  ondansetron (ZOFRAN) 4 MG tablet TAKE 1 TABLET BY MOUTH ONCE DAILY AS NEEDED FOR NAUSEA AND VOMITING Patient not taking: Reported on 03/29/2020 05/29/18   Leone Haven, MD    Allergies Tetracycline and Meperidine  Family History  Problem Relation Age of Onset  . Anxiety disorder Mother   . Paranoid behavior Mother   . Hypertension Mother   . Heart failure Mother   . Stroke Mother   . Dementia Mother   . High Cholesterol Mother   . Diabetes Mother   . Hyperlipidemia Mother   . Heart disease Mother   . Depression Mother   . Hypertension Father   . High Cholesterol Father   . Mood Disorder Sister   . Stroke Sister   . Anxiety disorder Maternal Aunt   . Drug abuse Cousin   . Tuberculosis Paternal Grandfather     Social History Social History   Tobacco Use  . Smoking status: Former Smoker    Types: Cigarettes    Quit date: 04/13/1997    Years since quitting: 22.9  . Smokeless tobacco: Never Used  Substance Use Topics  . Alcohol use: Yes    Alcohol/week: 1.0 standard drinks    Types: 1 Glasses of wine per week    Comment: 2-3 glasses of wine per month  . Drug use: No    Review of Systems  Review of Systems  Constitutional: Negative for fatigue and fever.  HENT: Negative for congestion and sore throat.   Eyes: Negative for visual disturbance.  Respiratory: Negative for cough and shortness of breath.   Cardiovascular: Negative for chest pain.  Gastrointestinal: Negative for abdominal pain, diarrhea, nausea and vomiting.  Genitourinary: Negative for flank pain.  Musculoskeletal: Positive for gait problem. Negative for back pain and neck pain.  Skin: Negative for rash and wound.  Neurological: Positive for dizziness and numbness. Negative for weakness.     ____________________________________________  PHYSICAL EXAM:      VITAL SIGNS: ED Triage Vitals [03/30/20 1207]  Enc Vitals Group     BP 136/64     Pulse Rate  72     Resp 20     Temp 98 F (36.7 C)     Temp Source Oral     SpO2 98 %  Weight 167 lb (75.8 kg)     Height _0  (1.6 m)     Head Circumference      Peak Flow      Pain Score 0     Pain Loc      Pain Edu?      Excl. in [place]?      Physical Exam Vitals and nursing note reviewed.  Constitutional:      General: She is not in acute distress.    Appearance: She is well-developed.  HENT:     Head: Normocephalic and atraumatic.  Eyes:     Conjunctiva/sclera: Conjunctivae normal.  Cardiovascular:     Rate and Rhythm: Normal rate and regular rhythm.     Heart sounds: Normal heart sounds. No murmur. No friction rub.  Pulmonary:     Effort: Pulmonary effort is normal. No respiratory distress.     Breath sounds: Normal breath sounds. No wheezing or rales.  Abdominal:     General: There is no distension.     Palpations: Abdomen is soft.     Tenderness: There is no abdominal tenderness.  Musculoskeletal:     Cervical back: Neck supple.  Skin:    General: Skin is warm.     Capillary Refill: Capillary refill takes less than 2 seconds.  Neurological:     Mental Status: She is alert and oriented to person, place, and time.     GCS: GCS motor subscore is 6.     Motor: No abnormal muscle tone.     Comments: Neurological Exam:  Mental Status: Alert and oriented to person, place, and time. Attention and concentration normal. Speech clear. Recent memory is intact. Cranial Nerves: Visual fields grossly intact. EOMI and PERRLA. No nystagmus noted. Facial sensation diminished R hemiface. No facial asymmetry or weakness. Hearing grossly normal. Uvula is midline, and palate elevates symmetrically. Normal SCM and trapezius strength. Tongue midline without fasciculations. Motor: Muscle strength 5/5 in proximal and distal UE and LE bilaterally. No pronator drift. Muscle tone normal. Reflexes: Normal  Sensation: Subjectively diminished throughout RUE and RLE Gait: Deferred          ____________________________________________   LABS (all labs ordered are listed, but only abnormal results are displayed)  Labs Reviewed  CBC - Abnormal; Notable for the following components:      Result Value   Hemoglobin 11.9 (*)    All other components within normal limits  SARS CORONAVIRUS 2 BY RT PCR (HOSPITAL ORDER, [place] [facility])  PROTIME-INR  APTT  DIFFERENTIAL  COMPREHENSIVE METABOLIC PANEL  HIV ANTIBODY (ROUTINE TESTING W REFLEX)  HEMOGLOBIN A1C  LIPID PANEL  I-STAT CREATININE, ED  CBG MONITORING, ED    ____________________________________________  EKG: Normal sinus rhythm, ventricular rate 69.  PR 142, QRS 158, QTc 495.  Left bundle branch block.  No acute ST elevations or depressions. ________________________________________  RADIOLOGY All imaging, including plain films, CT scans, and ultrasounds, independently reviewed by me, and interpretations confirmed via formal radiology reads.  ED MD interpretation:   CT head: Likely acute stroke in the right cerebellum  Official radiology report(s): CT HEAD WO CONTRAST  Result Date: 03/30/2020 CLINICAL DATA:  Right-sided facial, upper extremity, and lower extremity numbness EXAM: CT HEAD WITHOUT CONTRAST TECHNIQUE: Contiguous axial images were obtained from the base of the skull through the vertex without intravenous contrast. COMPARISON:  None. FINDINGS: Brain: There is mild age related volume loss. There is no intracranial mass, hemorrhage, extra-axial fluid collection, or  midline shift. Decreased attenuation is noted in the mid right cerebellum slightly lateral and inferior to the dentate nucleus. This area is concerning for an acute infarct in the mid right cerebellum. There is evidence of a prior small lacunar infarct in the superior left cerebellum. Elsewhere there is minimal small vessel disease in the centra semiovale bilaterally. Vascular: No hyperdense vessel. No appreciable vascular  calcification noted. Skull: The bony calvarium appears intact. Sinuses/Orbits: There is slight mucosal thickening in several ethmoid air cells. Other visualized paranasal sinuses are clear. Orbits appear symmetric bilaterally. Other: Mastoid air cells are clear. IMPRESSION: Decreased attenuation in the right cerebellum slightly lateral and inferior to the dentate nucleus, suspicious for acute infarct focally in this area. 2.  Prior small lacunar infarct in the upper left cerebellum. 3. Slight periventricular small vessel disease. No acute appearing supratentorial infarct evident on this study. No mass or hemorrhage. 4.  Mild mucosal thickening in several ethmoid air cells. Electronically Signed   By: Lowella Grip III M.D.   On: 03/30/2020 13:29   MR BRAIN WO CONTRAST  Result Date: 03/30/2020 CLINICAL DATA:  Right-sided numbness EXAM: MRI HEAD WITHOUT CONTRAST TECHNIQUE: Multiplanar, multiecho pulse sequences of the brain and surrounding structures were obtained without intravenous contrast. COMPARISON:  Head CT 03/30/2020 Brain MRI 04/18/2017 FINDINGS: Brain: Punctate focus of abnormal diffusion restriction within the dorsolateral left thalamus. Small, old left cerebellar infarct. Multifocal white matter hyperintensity, most commonly due to chronic ischemic microangiopathy. Normal volume of CSF spaces. No chronic microhemorrhage. Normal midline structures. Vascular: Normal flow voids. Skull and upper cervical spine: Normal marrow signal. Sinuses/Orbits: Negative. Other: None. IMPRESSION: 1. Punctate focus of acute ischemia within the dorsolateral left thalamus. No hemorrhage or mass effect. 2. Small, old left cerebellar infarct and findings of chronic ischemic microangiopathy. Electronically Signed   By: Ulyses Jarred M.D.   On: 03/30/2020 22:11    ____________________________________________  PROCEDURES   Procedure(s) performed (including Critical Care):  .1-3 Lead EKG Interpretation Performed  by: Duffy Bruce, MD Authorized by: Duffy Bruce, MD     Interpretation: normal     ECG rate:  70-80s   ECG rate assessment: normal     Rhythm: sinus rhythm     Ectopy: none     Conduction: normal   Comments:     Indication: New stroke deficit    ____________________________________________  INITIAL IMPRESSION / MDM / ASSESSMENT AND PLAN / ED COURSE  As part of my medical decision making, I reviewed the following data within the [place] notes reviewed and incorporated, Old chart reviewed, Notes from prior ED visits, and [place] Controlled Substance Database       *Melanie Ray was evaluated in Emergency Department on 03/30/2020 for the symptoms described in the history of present illness. She was evaluated in the context of the global COVID-19 pandemic, which necessitated consideration that the patient might be at risk for infection with the SARS-CoV-2 virus that causes COVID-19. Institutional protocols and algorithms that pertain to the evaluation of patients at risk for COVID-19 are in a state of rapid change based on information released by regulatory bodies including the CDC and federal and state organizations. These policies and algorithms were followed during the patient's care in the ED.  Some ED evaluations and interventions may be delayed as a result of limited staffing during the pandemic.*     Medical Decision Making:69 yo F here with left facial and arm numbness, loss of balance. Labs, imaging as above.  CT scan shows likely acute right cerebellar stroke, which fits with her exam and symptoms. No signs of hemorrhage. Her screening labs are overall reassuring. EKG without ectopy or arrhythmia. She has extensive risk factors including HTN, HLD, DM. Will admit.  ____________________________________________  FINAL CLINICAL IMPRESSION(S) / ED DIAGNOSES  Final diagnoses:  Cerebellar stroke, acute (HCC)     MEDICATIONS GIVEN DURING THIS  VISIT:  Medications  aspirin EC tablet 81 mg (81 mg Oral Given 03/30/20 2214)  carvedilol (COREG) tablet 12.5 mg (has no administration in time range)  lurasidone (LATUDA) tablet 40 mg (has no administration in time range)  OLANZapine (ZYPREXA) tablet 5 mg (5 mg Oral Given 03/30/20 2215)  dorzolamide-timolol (COSOPT) 22.3-6.8 MG/ML ophthalmic solution 1 drop (has no administration in time range)   stroke: mapping our early stages of recovery book (has no administration in time range)  0.9 %  sodium chloride infusion ( Intravenous New Bag/Given 03/30/20 2220)  acetaminophen (TYLENOL) tablet 650 mg (has no administration in time range)    Or  acetaminophen (TYLENOL) 160 MG/5ML solution 650 mg (has no administration in time range)    Or  acetaminophen (TYLENOL) suppository 650 mg (has no administration in time range)  clopidogrel (PLAVIX) tablet 75 mg (75 mg Oral Given 03/30/20 2215)  insulin aspart (novoLOG) injection 0-15 Units (has no administration in time range)  atorvastatin (LIPITOR) tablet 80 mg (has no administration in time range)  latanoprost (XALATAN) 0.005 % ophthalmic solution 1 drop (1 drop Both Eyes Given 03/30/20 2216)     ED Discharge Orders    None       Note:  This document was prepared using Dragon voice recognition software and may include unintentional dictation errors.   Duffy Bruce, MD 03/30/20 2231

## 2020-03-30 NOTE — ED Triage Notes (Addendum)
Pt presents to ED via [facility] with numbness to R side of face, arm, and leg since Saturday. Pt states awoke this morning with mild swelling to R lip. Pt with no difficulty breathing at this time. Pt denies pain. A&O x4.   Pt also reports had colonoscopy done yesterday.  Pt also reports weakness and dizziness, pt reports symptoms since Saturday.

## 2020-03-30 NOTE — ED Notes (Signed)
PT taken to MRI

## 2020-03-31 ENCOUNTER — Observation Stay: Payer: Medicare Other

## 2020-03-31 ENCOUNTER — Encounter: Payer: Self-pay | Admitting: Internal Medicine

## 2020-03-31 ENCOUNTER — Observation Stay
Admit: 2020-03-31 | Discharge: 2020-03-31 | Disposition: A | Discharge status: Home / Self Care | Payer: Medicare Other | Attending: Internal Medicine | Admitting: Internal Medicine

## 2020-03-31 DIAGNOSIS — I639 Cerebral infarction, unspecified: Secondary | ICD-10-CM | POA: Diagnosis not present

## 2020-03-31 DIAGNOSIS — F419 Anxiety disorder, unspecified: Secondary | ICD-10-CM | POA: Diagnosis not present

## 2020-03-31 DIAGNOSIS — I63532 Cerebral infarction due to unspecified occlusion or stenosis of left posterior cerebral artery: Secondary | ICD-10-CM | POA: Diagnosis not present

## 2020-03-31 DIAGNOSIS — I2510 Atherosclerotic heart disease of native coronary artery without angina pectoris: Secondary | ICD-10-CM | POA: Diagnosis not present

## 2020-03-31 DIAGNOSIS — I5022 Chronic systolic (congestive) heart failure: Secondary | ICD-10-CM

## 2020-03-31 DIAGNOSIS — S199XXA Unspecified injury of neck, initial encounter: Secondary | ICD-10-CM | POA: Diagnosis not present

## 2020-03-31 DIAGNOSIS — E7800 Pure hypercholesterolemia, unspecified: Secondary | ICD-10-CM

## 2020-03-31 DIAGNOSIS — I63213 Cerebral infarction due to unspecified occlusion or stenosis of bilateral vertebral arteries: Secondary | ICD-10-CM | POA: Diagnosis not present

## 2020-03-31 DIAGNOSIS — S0990XA Unspecified injury of head, initial encounter: Secondary | ICD-10-CM | POA: Diagnosis not present

## 2020-03-31 DIAGNOSIS — I63233 Cerebral infarction due to unspecified occlusion or stenosis of bilateral carotid arteries: Secondary | ICD-10-CM | POA: Diagnosis not present

## 2020-03-31 LAB — LIPID PANEL
Cholesterol: 126 mg/dL (ref 0–200)
HDL: 34 mg/dL — ABNORMAL LOW (ref >40)
LDL Cholesterol: 77 mg/dL (ref 0–99)
Total CHOL/HDL Ratio: 3.7 RATIO
Triglycerides: 74 mg/dL (ref <150)
VLDL: 15 mg/dL (ref 0–40)

## 2020-03-31 LAB — GLUCOSE, CAPILLARY
Glucose-Capillary: 94 mg/dL (ref 70–99)
Glucose-Capillary: 80 mg/dL (ref 70–99)
Glucose-Capillary: 127 mg/dL — ABNORMAL HIGH (ref 70–99)
Glucose-Capillary: 103 mg/dL — ABNORMAL HIGH (ref 70–99)

## 2020-03-31 LAB — HIV ANTIBODY (ROUTINE TESTING W REFLEX): HIV Screen 4th Generation wRfx: NONREACTIVE

## 2020-03-31 LAB — HEMOGLOBIN A1C
Hgb A1c MFr Bld: 6.5 % — ABNORMAL HIGH (ref 4.8–5.6)
Mean Plasma Glucose: 139.85 mg/dL

## 2020-03-31 LAB — SURGICAL PATHOLOGY

## 2020-03-31 IMAGING — CT CT ANGIO HEAD
1 of 13 series · 4 of 33 positions shown · IV contrast (APPLIED)
Comparison: MRI head [DATE].  CT head [DATE]
COMPARISON: MRI head [DATE].  CT head [DATE]
COMPARISON: MRI head [DATE].  CT head [DATE]

Addendum:
CLINICAL DATA: Follow-up stroke. Right-sided paresthesia. Left
thalamic infarct on MRI yesterday

EXAM:
CT ANGIOGRAPHY HEAD AND NECK
TECHNIQUE: Multidetector CT imaging of the head and neck was performed using
the standard protocol during bolus administration of intravenous
contrast. Multiplanar CT image reconstructions and MIPs were
obtained to evaluate the vascular anatomy. Carotid stenosis
measurements (when applicable) are obtained utilizing NASCET
criteria, using the distal internal carotid diameter as the
denominator.
CONTRAST:  75mL OMNIPAQUE IOHEXOL 350 MG/ML SOLN

[Series 9: cta head neck thins · axial · 0.35mm/px · z∈[-354,-158]mm · 4 of 675 slices shown]
[im 135/675  soft-tissue]
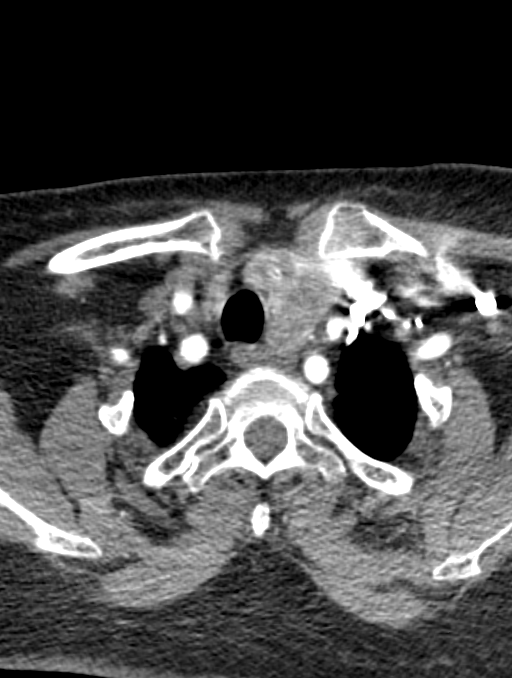
[im 270/675  bone]
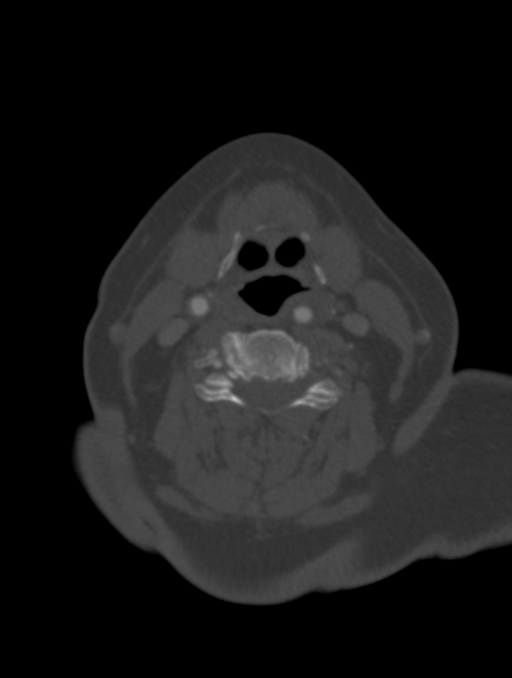
[im 405/675  soft-tissue]
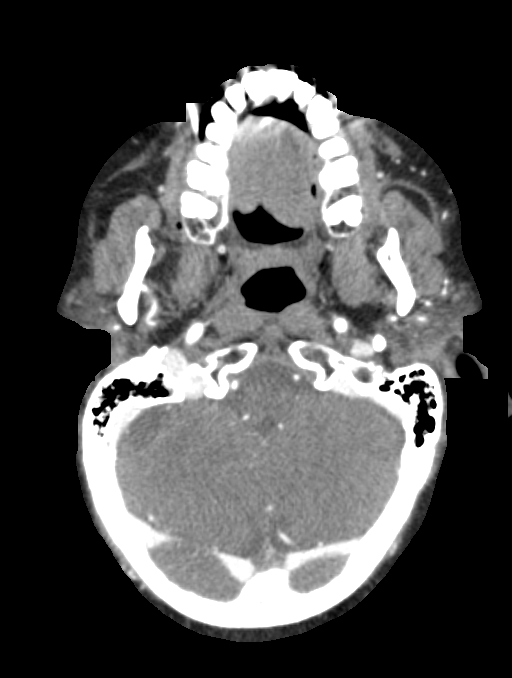
[im 540/675  bone]
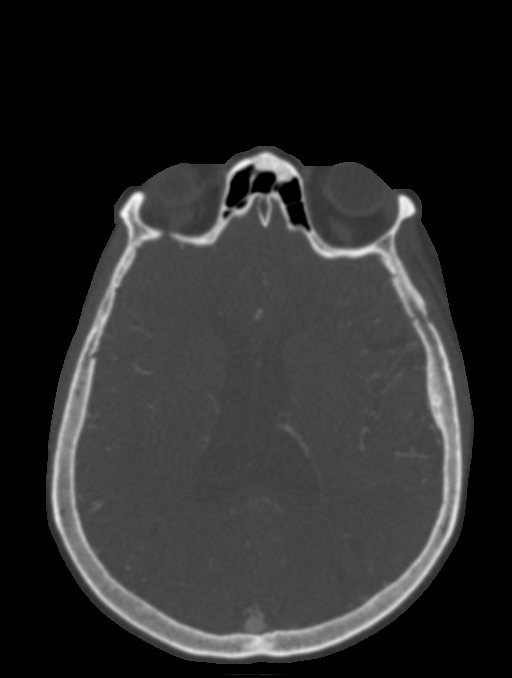

[4 of 33 positions shown; findings below may reference images not displayed]

FINDINGS: CT HEAD FINDINGS

Brain: Mild atrophy. Punctate acute infarct left thalamus seen on
MRI is not visualized on the current CT. No acute cortical infarct,
hemorrhage, or mass lesion. Ventricle size mildly prominent due to
atrophy. Small chronic infarct left cerebellum unchanged from MRI.

Vascular: Negative for hyperdense vessel

Skull: No focal skeletal lesion.

Sinuses: Paranasal sinuses clear.

Orbits: Bilateral cataract extraction.

Review of the MIP images confirms the above findings

CTA NECK FINDINGS

Aortic arch: Atherosclerotic aorta. Three-vessel arch. 50% diameter
stenosis proximal left common carotid artery. 50% diameter stenosis
proximal left subclavian artery.

Right carotid system: Atherosclerotic calcification right common
carotid artery with mild stenosis. Atherosclerotic calcification
right carotid bifurcation without significant stenosis.

Left carotid system: 50% diameter stenosis proximal left common
carotid artery. Scattered atherosclerotic calcification in the mid
and distal left common carotid artery without significant stenosis.
Mild atherosclerotic disease left carotid bifurcation without
significant stenosis.

Vertebral arteries: Right vertebral artery dominant. Moderate to
severe stenosis proximal right vertebral artery due to
atherosclerotic plaque. Remainder of the right vertebral artery is
widely patent.

Severe stenosis at the origin of the left vertebral artery. Left
vertebral artery is a small vessel which is diffusely diseased
proximally. Left vertebral artery is patent to the basilar.

Skeleton: No acute skeletal abnormality.  Cervical spondylosis.

Other neck: Multiple thyroid nodules. Largest nodule on the left
measures 20 mm. No adenopathy in the neck.

Upper chest: Lung apices clear bilaterally. Atherosclerotic aortic
arch.

Review of the MIP images confirms the above findings

CTA HEAD FINDINGS

Anterior circulation: Internal carotid artery widely patent through
the cavernous segment. Anterior and middle cerebral arteries patent
bilaterally without large vessel occlusion or flow limiting
stenosis.

Posterior circulation: Both vertebral arteries are small but patent
to the basilar. PICA patent bilaterally. Basilar is small and widely
patent. Fetal origin of the posterior cerebral artery bilaterally.
Superior cerebellar arteries patent bilaterally. Right posterior
cerebral artery patent without stenosis

Occlusion left P3 segment.  Moderate stenosis left P2 segment.

Venous sinuses: Normal venous enhancement

Anatomic variants: None

Review of the MIP images confirms the above findings
IMPRESSION: 1. CT head negative for acute abnormality. Small acute infarct in
the left thalamus seen on MRI last night is not visualized by CT. No
intracranial hemorrhage
2. Atherosclerotic aortic arch and proximal great vessels.
Atherosclerotic disease in the carotid bifurcation bilaterally
without significant stenosis.
3. Moderate to severe stenosis proximal right vertebral artery which
is then patent to the basilar. Severe stenosis proximal left
vertebral artery which is diffusely diseased and small but does
contribute to the basilar.
4. Occlusion left P3 segment.  Moderate stenosis left P2 segment
5. Anterior circulation widely patent.
6. Multiple thyroid nodules. The largest nodule on the left is 20
mm. Thyroid ultrasound recommended. (Ref: [HOSPITAL]. [8Q]

ADDENDUM:
Preliminary report texted to Dr. RASOIAN via AMION

ADDENDUM:
Dr. RASOIAN has just returned my page and I informed her of the CTA
results.

*** End of Addendum ***
Addendum:
FINDINGS: CT HEAD FINDINGS

Brain: Mild atrophy. Punctate acute infarct left thalamus seen on
MRI is not visualized on the current CT. No acute cortical infarct,
hemorrhage, or mass lesion. Ventricle size mildly prominent due to
atrophy. Small chronic infarct left cerebellum unchanged from MRI.

Vascular: Negative for hyperdense vessel

Skull: No focal skeletal lesion.

Sinuses: Paranasal sinuses clear.

Orbits: Bilateral cataract extraction.

Review of the MIP images confirms the above findings

CTA NECK FINDINGS

Aortic arch: Atherosclerotic aorta. Three-vessel arch. 50% diameter
stenosis proximal left common carotid artery. 50% diameter stenosis
proximal left subclavian artery.

Right carotid system: Atherosclerotic calcification right common
carotid artery with mild stenosis. Atherosclerotic calcification
right carotid bifurcation without significant stenosis.

Left carotid system: 50% diameter stenosis proximal left common
carotid artery. Scattered atherosclerotic calcification in the mid
and distal left common carotid artery without significant stenosis.
Mild atherosclerotic disease left carotid bifurcation without
significant stenosis.

Vertebral arteries: Right vertebral artery dominant. Moderate to
severe stenosis proximal right vertebral artery due to
atherosclerotic plaque. Remainder of the right vertebral artery is
widely patent.

Severe stenosis at the origin of the left vertebral artery. Left
vertebral artery is a small vessel which is diffusely diseased
proximally. Left vertebral artery is patent to the basilar.

Skeleton: No acute skeletal abnormality.  Cervical spondylosis.

Other neck: Multiple thyroid nodules. Largest nodule on the left
measures 20 mm. No adenopathy in the neck.

Upper chest: Lung apices clear bilaterally. Atherosclerotic aortic
arch.

Review of the MIP images confirms the above findings

CTA HEAD FINDINGS

Anterior circulation: Internal carotid artery widely patent through
the cavernous segment. Anterior and middle cerebral arteries patent
bilaterally without large vessel occlusion or flow limiting
stenosis.

Posterior circulation: Both vertebral arteries are small but patent
to the basilar. PICA patent bilaterally. Basilar is small and widely
patent. Fetal origin of the posterior cerebral artery bilaterally.
Superior cerebellar arteries patent bilaterally. Right posterior
cerebral artery patent without stenosis

Occlusion left P3 segment.  Moderate stenosis left P2 segment.

Venous sinuses: Normal venous enhancement

Anatomic variants: None

Review of the MIP images confirms the above findings
IMPRESSION: 1. CT head negative for acute abnormality. Small acute infarct in
the left thalamus seen on MRI last night is not visualized by CT. No
intracranial hemorrhage
2. Atherosclerotic aortic arch and proximal great vessels.
Atherosclerotic disease in the carotid bifurcation bilaterally
without significant stenosis.
3. Moderate to severe stenosis proximal right vertebral artery which
is then patent to the basilar. Severe stenosis proximal left
vertebral artery which is diffusely diseased and small but does
contribute to the basilar.
4. Occlusion left P3 segment.  Moderate stenosis left P2 segment
5. Anterior circulation widely patent.
6. Multiple thyroid nodules. The largest nodule on the left is 20
mm. Thyroid ultrasound recommended. (Ref: [HOSPITAL]. [8Q]

ADDENDUM:
Preliminary report texted to Dr. RASOIAN via [employer]

*** End of Addendum ***
FINDINGS: CT HEAD FINDINGS

Brain: Mild atrophy. Punctate acute infarct left thalamus seen on
MRI is not visualized on the current CT. No acute cortical infarct,
hemorrhage, or mass lesion. Ventricle size mildly prominent due to
atrophy. Small chronic infarct left cerebellum unchanged from MRI.

Vascular: Negative for hyperdense vessel

Skull: No focal skeletal lesion.

Sinuses: Paranasal sinuses clear.

Orbits: Bilateral cataract extraction.

Review of the MIP images confirms the above findings

CTA NECK FINDINGS

Aortic arch: Atherosclerotic aorta. Three-vessel arch. 50% diameter
stenosis proximal left common carotid artery. 50% diameter stenosis
proximal left subclavian artery.

Right carotid system: Atherosclerotic calcification right common
carotid artery with mild stenosis. Atherosclerotic calcification
right carotid bifurcation without significant stenosis.

Left carotid system: 50% diameter stenosis proximal left common
carotid artery. Scattered atherosclerotic calcification in the mid
and distal left common carotid artery without significant stenosis.
Mild atherosclerotic disease left carotid bifurcation without
significant stenosis.

Vertebral arteries: Right vertebral artery dominant. Moderate to
severe stenosis proximal right vertebral artery due to
atherosclerotic plaque. Remainder of the right vertebral artery is
widely patent.

Severe stenosis at the origin of the left vertebral artery. Left
vertebral artery is a small vessel which is diffusely diseased
proximally. Left vertebral artery is patent to the basilar.

Skeleton: No acute skeletal abnormality.  Cervical spondylosis.

Other neck: Multiple thyroid nodules. Largest nodule on the left
measures 20 mm. No adenopathy in the neck.

Upper chest: Lung apices clear bilaterally. Atherosclerotic aortic
arch.

Review of the MIP images confirms the above findings

CTA HEAD FINDINGS

Anterior circulation: Internal carotid artery widely patent through
the cavernous segment. Anterior and middle cerebral arteries patent
bilaterally without large vessel occlusion or flow limiting
stenosis.

Posterior circulation: Both vertebral arteries are small but patent
to the basilar. PICA patent bilaterally. Basilar is small and widely
patent. Fetal origin of the posterior cerebral artery bilaterally.
Superior cerebellar arteries patent bilaterally. Right posterior
cerebral artery patent without stenosis

Occlusion left P3 segment.  Moderate stenosis left P2 segment.

Venous sinuses: Normal venous enhancement

Anatomic variants: None

Review of the MIP images confirms the above findings
IMPRESSION: 1. CT head negative for acute abnormality. Small acute infarct in
the left thalamus seen on MRI last night is not visualized by CT. No
intracranial hemorrhage
2. Atherosclerotic aortic arch and proximal great vessels.
Atherosclerotic disease in the carotid bifurcation bilaterally
without significant stenosis.
3. Moderate to severe stenosis proximal right vertebral artery which
is then patent to the basilar. Severe stenosis proximal left
vertebral artery which is diffusely diseased and small but does
contribute to the basilar.
4. Occlusion left P3 segment.  Moderate stenosis left P2 segment
5. Anterior circulation widely patent.
6. Multiple thyroid nodules. The largest nodule on the left is 20
mm. Thyroid ultrasound recommended. (Ref: [HOSPITAL]. [8Q]

## 2020-03-31 IMAGING — CT CT CERVICAL SPINE W/O CM
2 of 3 series · 9 of 27 positions shown, 11 images · non-contrast
Comparison: MRI [DATE], CT [DATE] some

CLINICAL DATA: Fall, reported head strike

EXAM:
CT HEAD WITHOUT CONTRAST
CT CERVICAL SPINE WITHOUT CONTRAST
TECHNIQUE: Multidetector CT imaging of the head and cervical spine was
performed following the standard protocol without intravenous
contrast. Multiplanar CT image reconstructions of the cervical spine
were also generated.

[Series 4: sagittal bone · sagittal · 0.23mm/px · 5 of 38 slices shown, 6 images]
[im 13/38  bone]
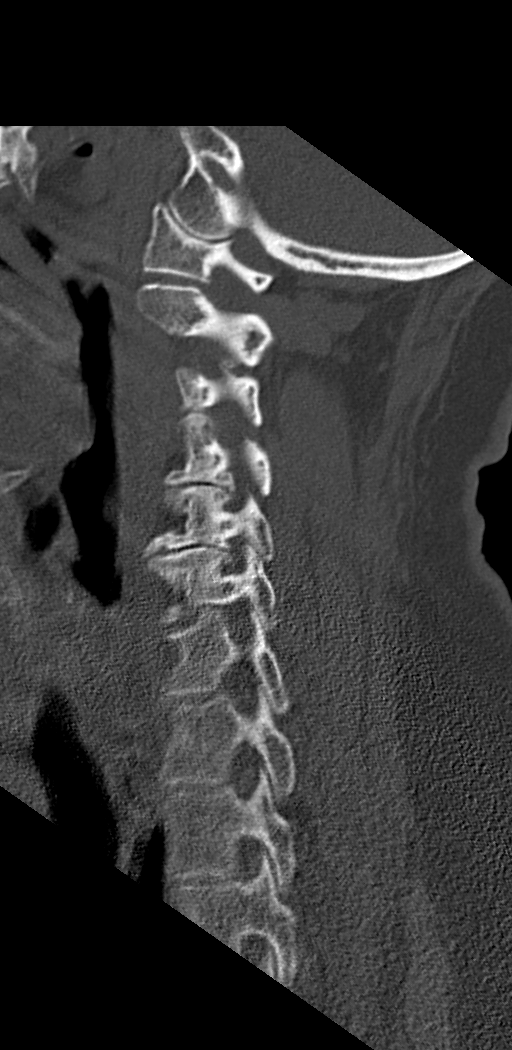
[im 16/38  bone]
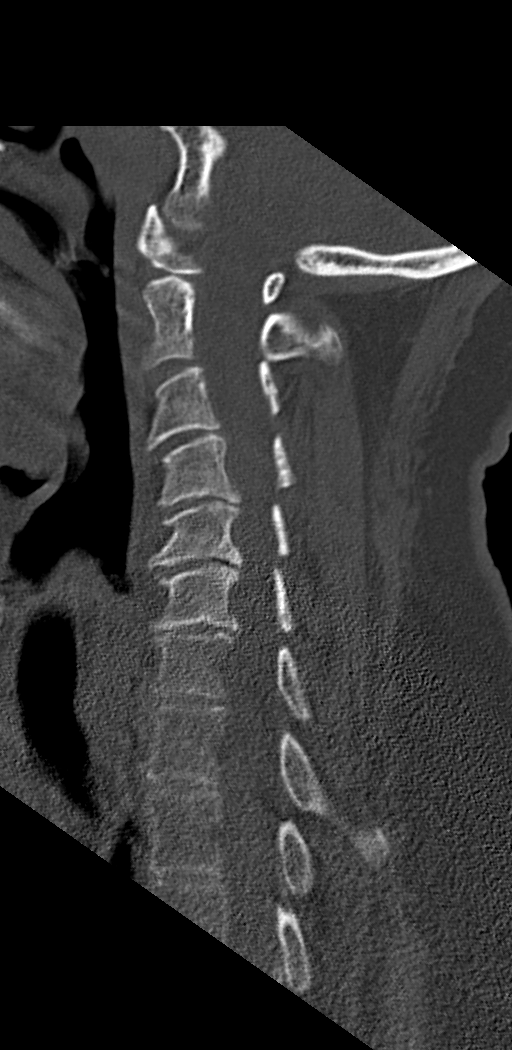
[im 19/38  soft-tissue]
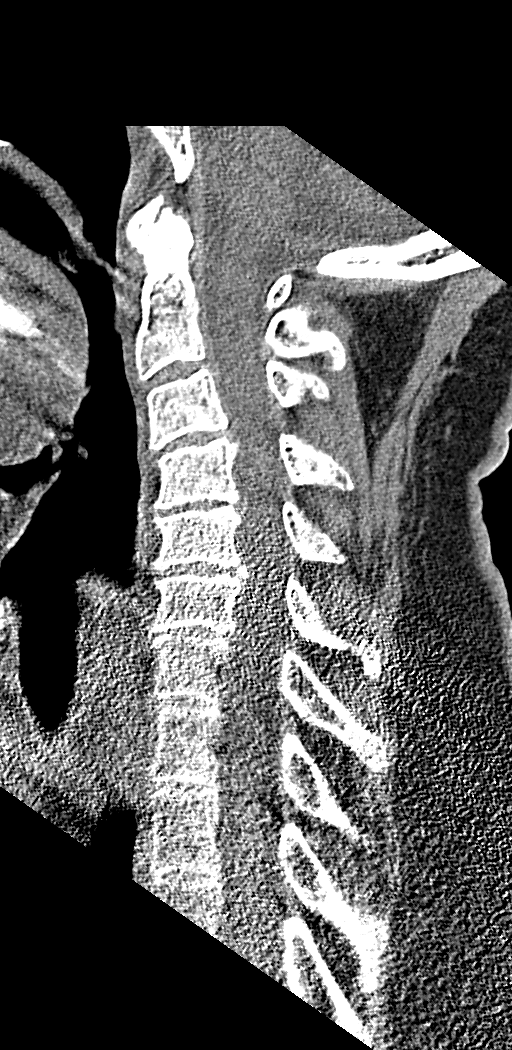
[im 19/38  bone]
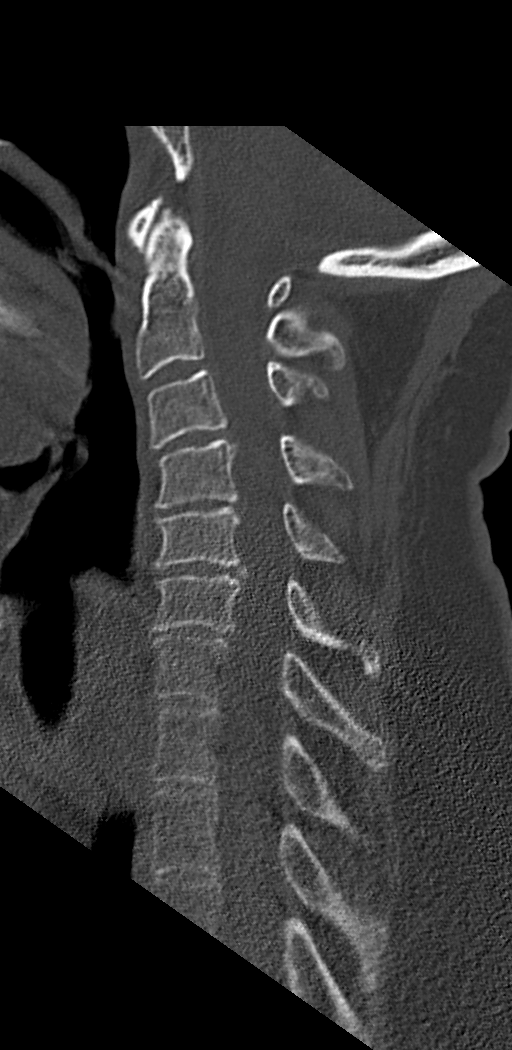
[im 22/38  bone]
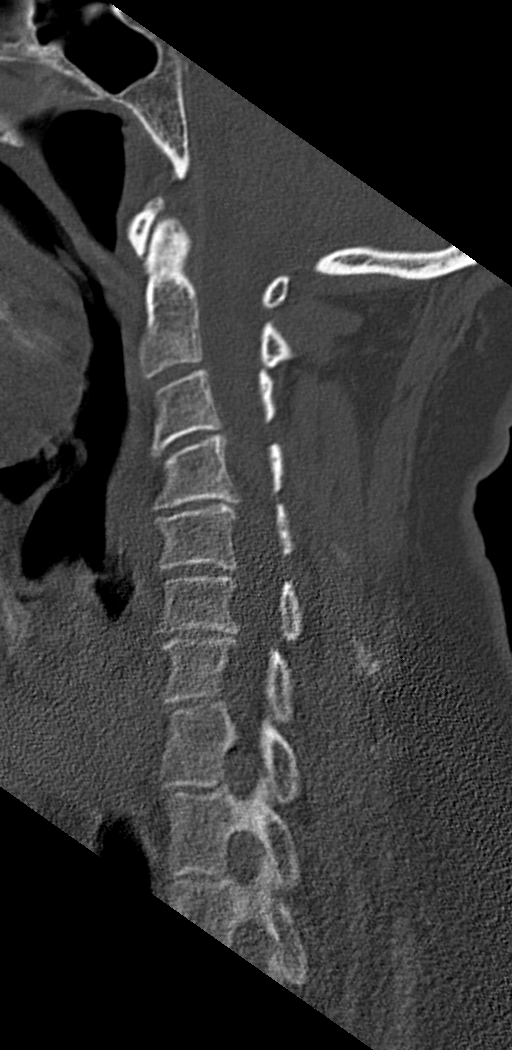
[im 25/38  bone]
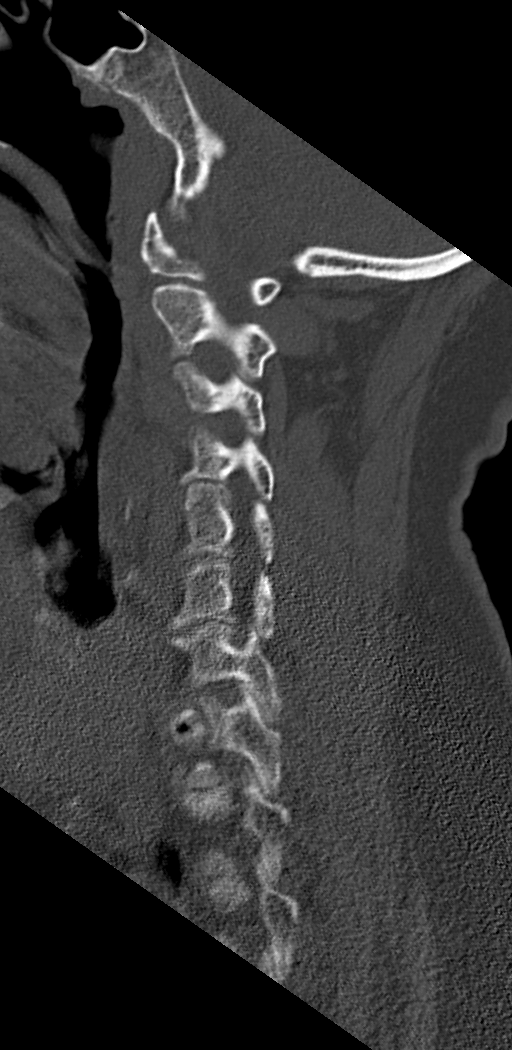

[Series 6: orthogonal bone · axial · 0.17mm/px · z∈[-288,-190]mm · 4 of 90 slices shown, 5 images]
[im 15/90  soft-tissue]
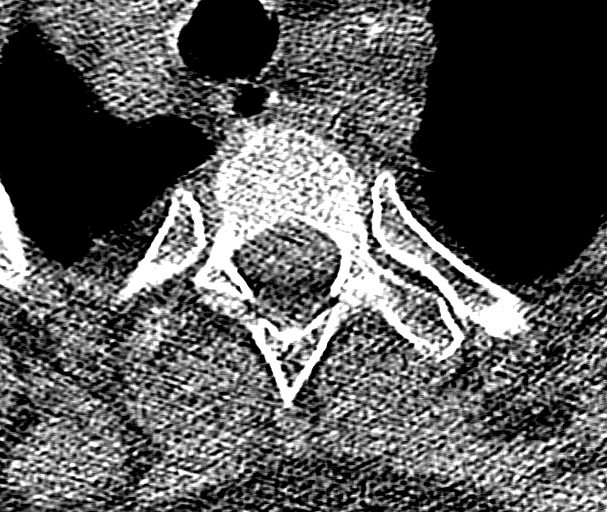
[im 15/90  bone]
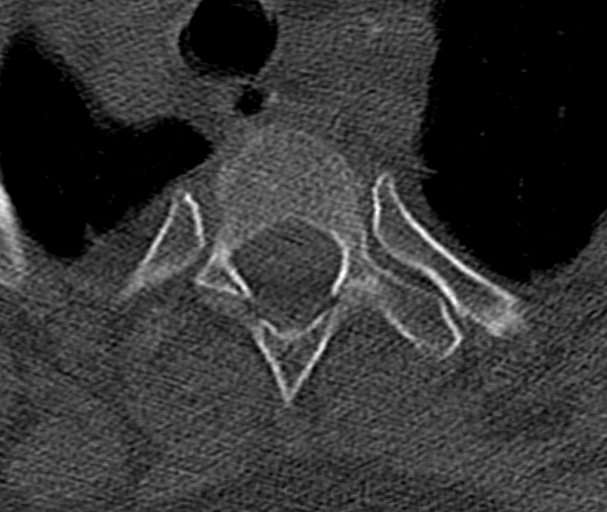
[im 30/90  bone]
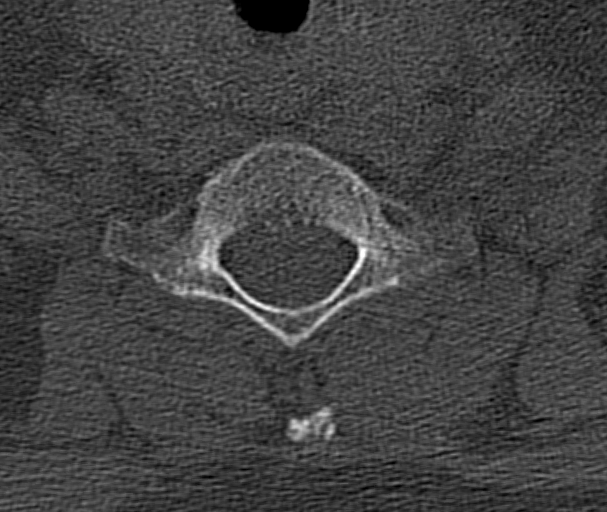
[im 60/90  bone]
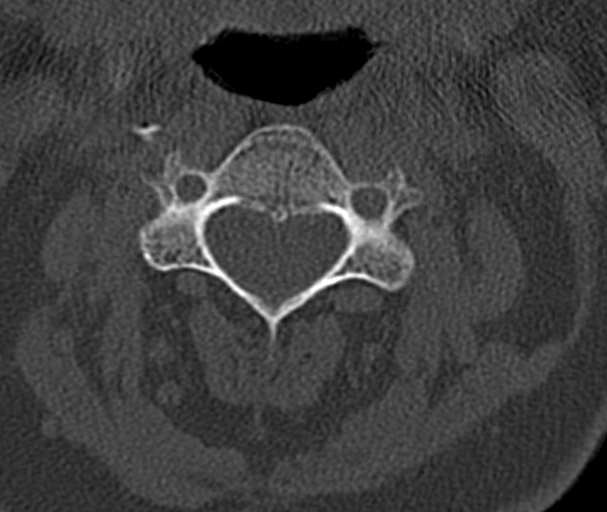
[im 75/90  bone]
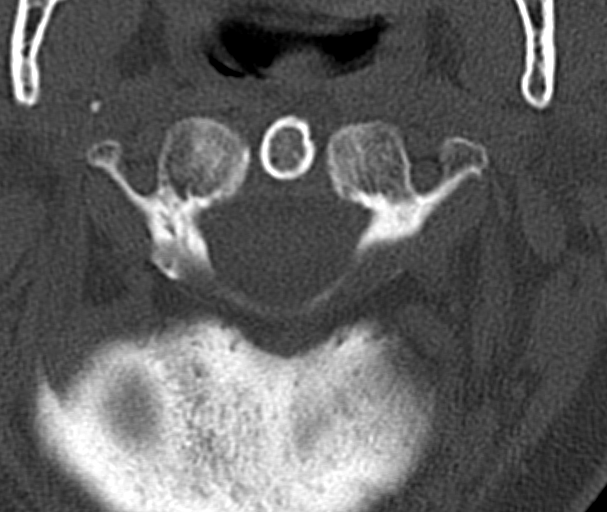

[9 of 27 positions shown; findings below may reference images not displayed]

FINDINGS: CT HEAD FINDINGS

Brain: Known focus of acute ischemia in the dorsal lateral left
thalamus is poorly visualized on CT examination, much better seen on
the more sensitive diffusion-weighted imaging performed several
hours prior. There is a remote left cerebellar lacunar infarct. No
new visible areas of ischemia. No evidence of acute hemorrhage,
hydrocephalus, extra-axial collection or mass lesion/mass effect.
Symmetric prominence of the ventricles, cisterns and sulci
compatible with parenchymal volume loss. Patchy areas of white
matter hypoattenuation are most compatible with chronic
microvascular angiopathy.

Vascular: Atherosclerotic calcification of the carotid siphons. No
hyperdense vessel.

Skull: No calvarial fracture or suspicious osseous lesion. No scalp
swelling or hematoma.

Sinuses/Orbits: Paranasal sinuses and mastoid air cells are
predominantly clear. Included orbital structures are unremarkable.

Other: None

CT CERVICAL SPINE FINDINGS

Alignment: Stabilization collar is absent at the time of
examination. Mild reversal of the normal cervical lordosis may in
part be due to slight neck flexion seen on the scout imaging. No
evidence of traumatic listhesis. No abnormally widened, perched or
jumped facets. Normal alignment of the craniocervical and
atlantoaxial articulations.

Skull base and vertebrae: No acute fracture. No primary bone lesion
or focal pathologic process.

Soft tissues and spinal canal: No pre or paravertebral fluid or
swelling. No visible canal hematoma. Airways patent.

Disc levels: Multilevel intervertebral disc height loss with
spondylitic endplate changes. Posterior disc osteophyte complexes at
the C3-4, C4-5 and C5-6 level result in at most mild canal stenosis
while more uncinate spurring and facet degenerative changes result
in moderate narrowing on the left C6-7 and on the right at C4-5 and
C5-6. No severe canal or foraminal narrowing.

Upper chest: No acute abnormality in the upper chest or imaged lung
apices. Extensive calcification of the proximal great vessels and
cervical carotids.

Other: Diffusely enlarged and heterogeneous thyroid gland.
IMPRESSION: 1. Known focus of acute ischemia in the dorsal lateral left thalamus
is poorly visualized on CT examination, much better seen on the more
sensitive diffusion-weighted imaging performed several hours prior.
2. No new visible areas of ischemia or acute intracranial
abnormality.
3. No significant scalp swelling or hematoma. No calvarial fracture.
4. Stable parenchymal volume loss and chronic microvascular ischemic
changes.
5. No acute fracture or malalignment of the cervical spine.
6. Multilevel degenerative disc disease and facet degenerative
changes of the cervical spine as described.
7. Diffusely enlarged and heterogeneous thyroid gland. Consider
further evaluation with thyroid ultrasound. This follows consensus
guidelines: Managing Incidental Thyroid Nodules Detected on Imaging:
White Paper of [REDACTED]. [HOSPITAL] [3W]; [DATE]. and REUBEN 3-tiered system for managing
ITNs: [HOSPITAL]. [DATE]): 143-50
8. Cervical and intracranial atherosclerosis.

## 2020-03-31 IMAGING — US US CAROTID DUPLEX BILAT
1 series · 13 of 24 positions shown · non-contrast
Comparison: None.

CLINICAL DATA: CVA. Right-sided numbness. History of CAD,
hyperlipidemia and diabetes. Former smoker.

EXAM:
BILATERAL CAROTID DUPLEX ULTRASOUND
TECHNIQUE: Gray scale imaging, color Doppler and duplex ultrasound were
performed of bilateral carotid and vertebral arteries in the neck.

[Series 1: us carotid bilateral · 13 of 65 slices shown]
[im 1/65]
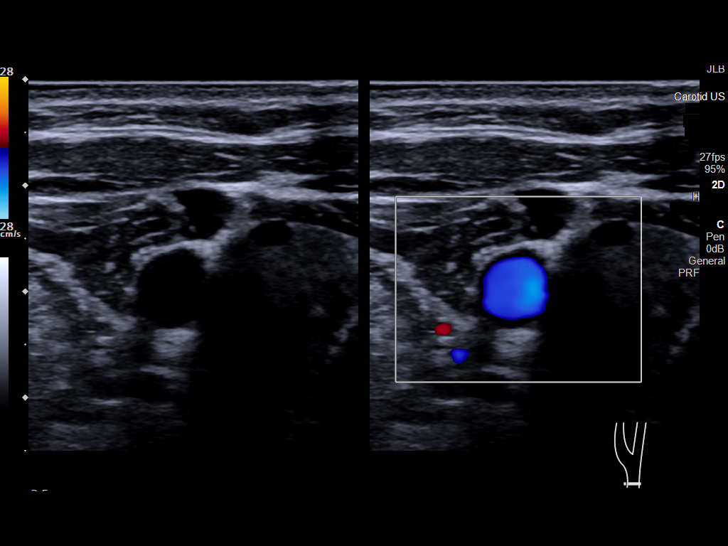
[im 6/65]
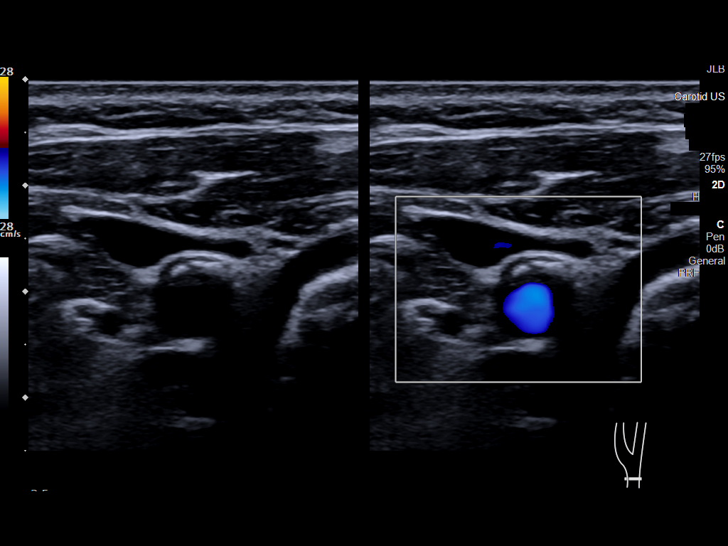
[im 12/65]
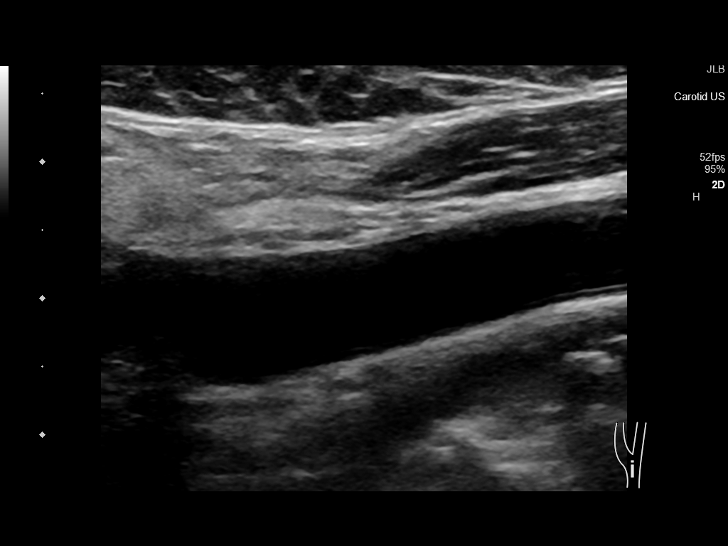
[im 17/65]
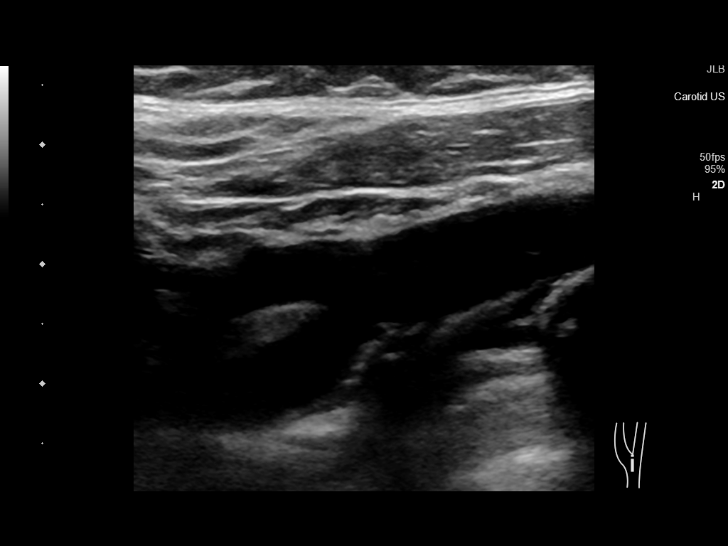
[im 23/65]
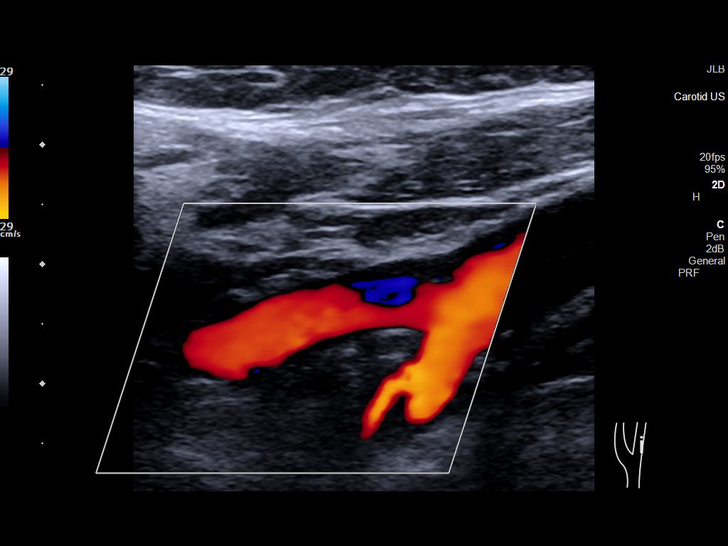
[im 28/65]
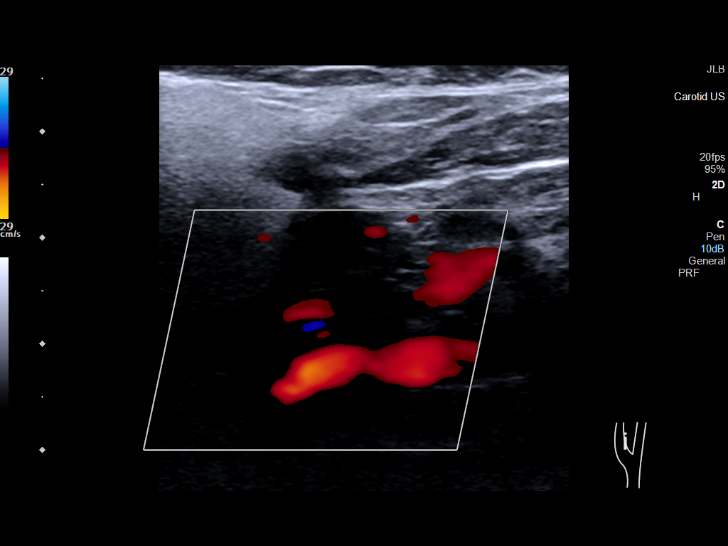
[im 34/65]
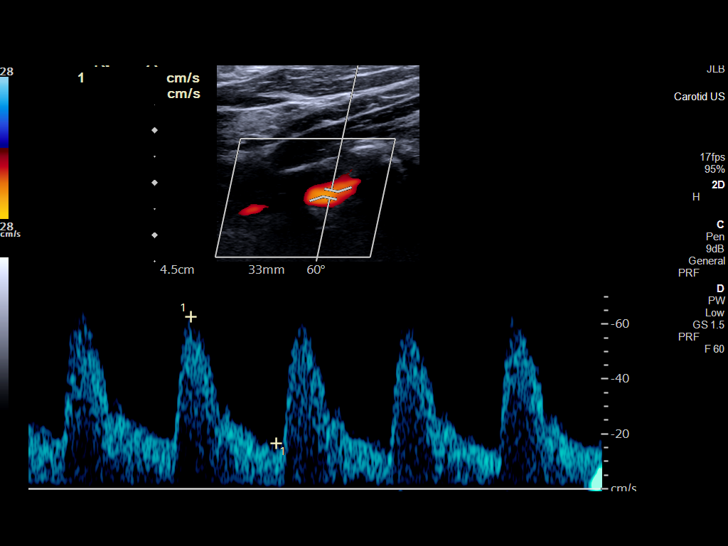
[im 37/65]
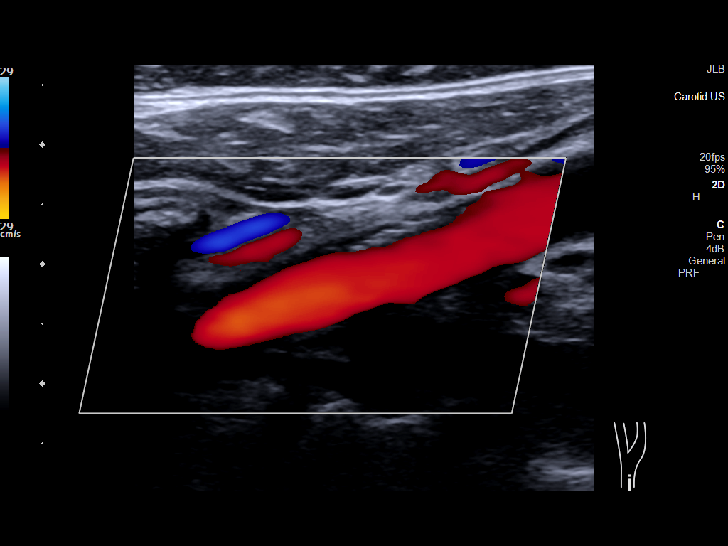
[im 42/65]
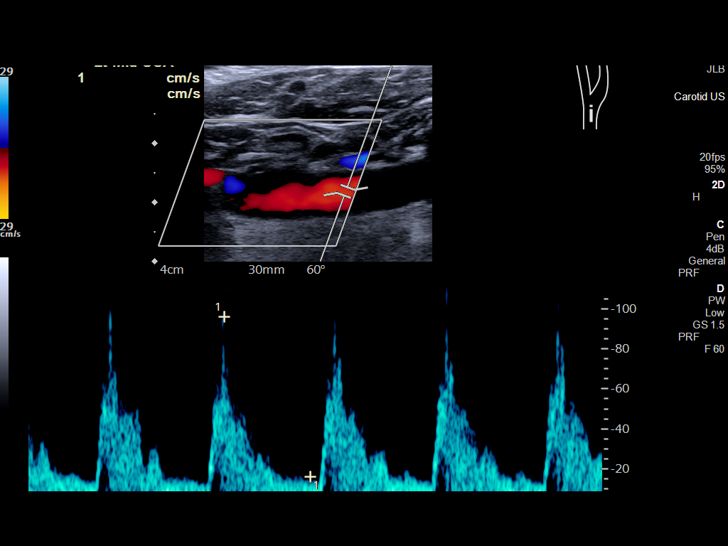
[im 48/65]
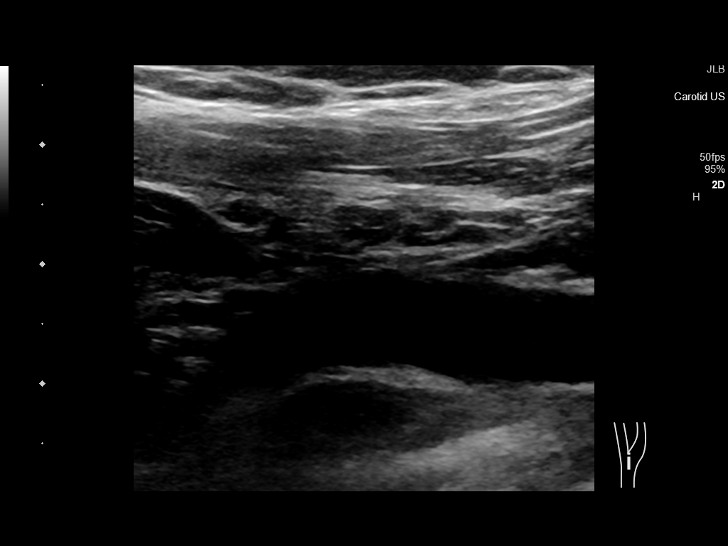
[im 53/65]
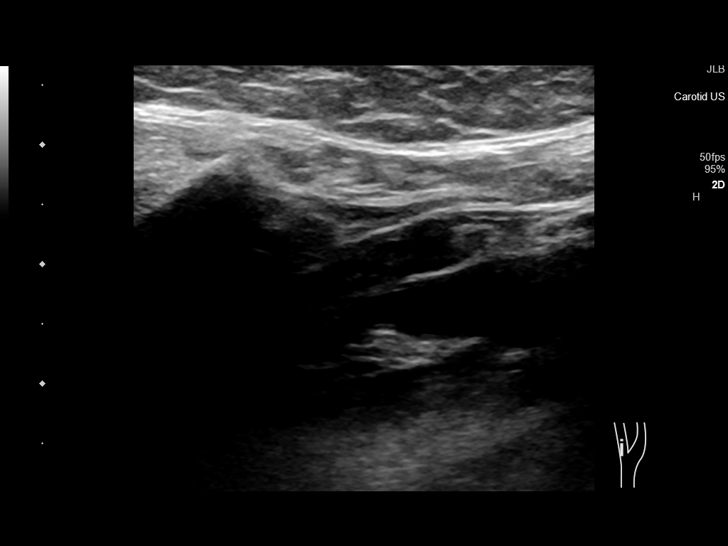
[im 59/65]
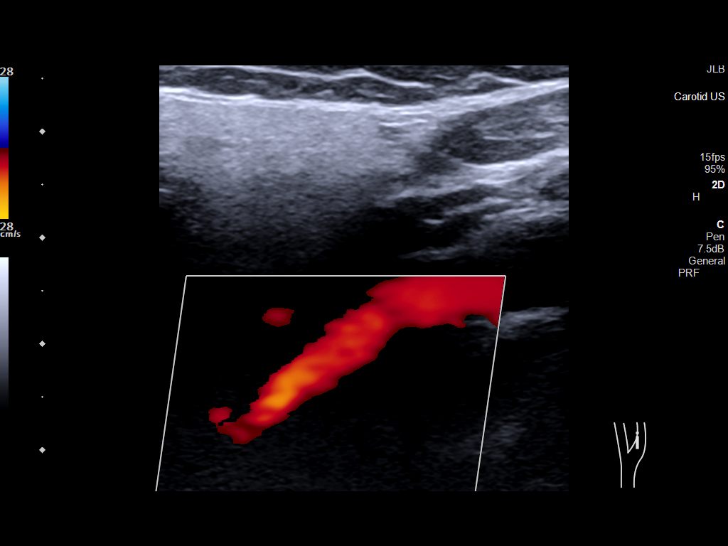
[im 65/65]
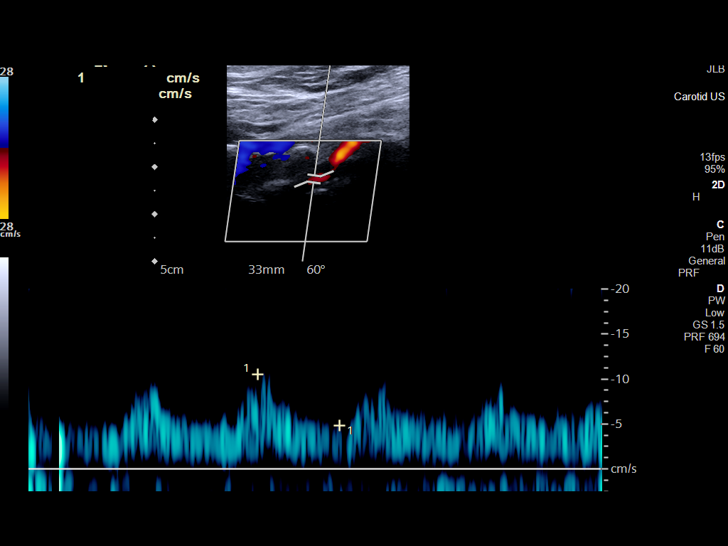

[13 of 24 positions shown; findings below may reference images not displayed]

FINDINGS: Criteria: Quantification of carotid stenosis is based on velocity
parameters that correlate the residual internal carotid diameter
with NASCET-based stenosis levels, using the diameter of the distal
internal carotid lumen as the denominator for stenosis measurement.

The following velocity measurements were obtained:

RIGHT

ICA: 131/30 cm/sec

CCA: 90/21 cm/sec

SYSTOLIC ICA/CCA RATIO:

ECA: 104 cm/sec

LEFT

ICA: 80/14 cm/sec

CCA: 96/16 cm/sec

SYSTOLIC ICA/CCA RATIO:

ECA: 132 cm/sec

RIGHT CAROTID ARTERY: There is a moderate amount plaque involving
the mid aspect of the right common carotid artery of echogenic
(images 7 and 9. There is a minimal amount of eccentric echogenic
plaque involving the origin of the right internal carotid artery
(image 26, which results in borderline elevated peak systolic
velocity with the proximal aspect of the right internal carotid
artery. Greatest acquired peak systolic velocity with the proximal
right ICA measures 131 centimeters/second (image 28).

RIGHT VERTEBRAL ARTERY:  Antegrade Flow

LEFT CAROTID ARTERY: There is a moderate amount of eccentric
echogenic plaque involving the proximal aspect of the left common
carotid artery (image 37). There is a minimal amount of eccentric
echogenic plaque involving the origin and proximal aspect the left
internal carotid artery (image 53), not resulting in elevated peak
systolic velocities within the interrogated course the left internal
carotid artery to suggest a hemodynamically significant stenosis.
Borderline elevated peak systolic velocity within distal aspect the
left internal carotid artery is felt to be factitious slightly
elevated due to sampling at a location of turbulent flow (image 63).

LEFT VERTEBRAL ARTERY:  Antegrade flow
IMPRESSION: 1. Moderate amount of right-sided atherosclerotic plaque results in
borderline elevated peak systolic velocities within the right
internal carotid artery compatible with the lower end of the 50-69%
luminal narrowing range. Further evaluation with CTA could performed
as clinically indicated.
2. Moderate amount of left-sided atherosclerotic plaque, not
definitely resulting in a hemodynamically significant stenosis.

## 2020-03-31 IMAGING — CT CT HEAD W/O CM
3 series · 14 of 46 positions shown, 16 images · non-contrast
Comparison: MRI [DATE], CT [DATE] some

CLINICAL DATA: Fall, reported head strike

EXAM:
CT HEAD WITHOUT CONTRAST
CT CERVICAL SPINE WITHOUT CONTRAST
TECHNIQUE: Multidetector CT imaging of the head and cervical spine was
performed following the standard protocol without intravenous
contrast. Multiplanar CT image reconstructions of the cervical spine
were also generated.

[Series 3: head wo · axial · 0.41mm/px · z∈[-135,-15]mm · 8 of 29 slices shown, 10 images]
[im 3/29  brain]
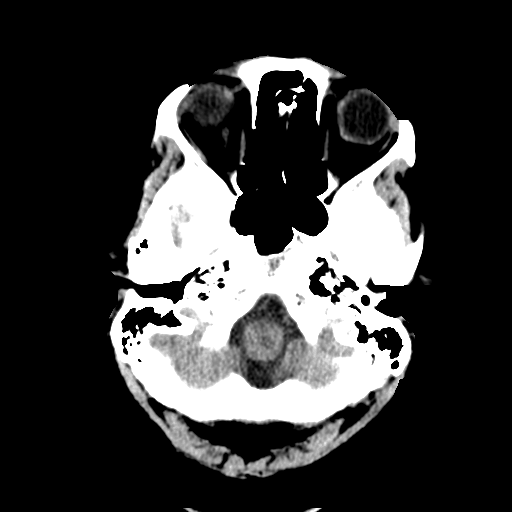
[im 3/29  bone]
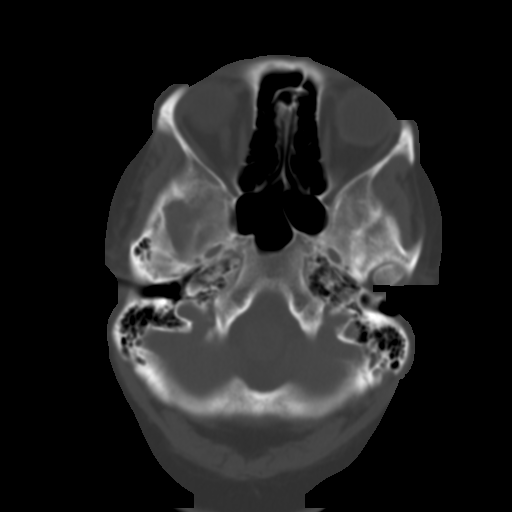
[im 7/29  brain]
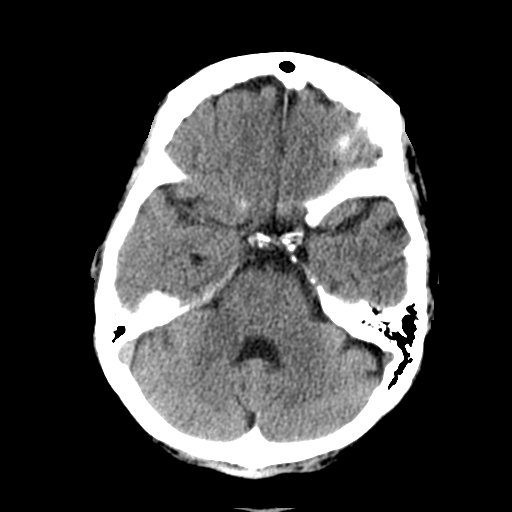
[im 10/29  brain]
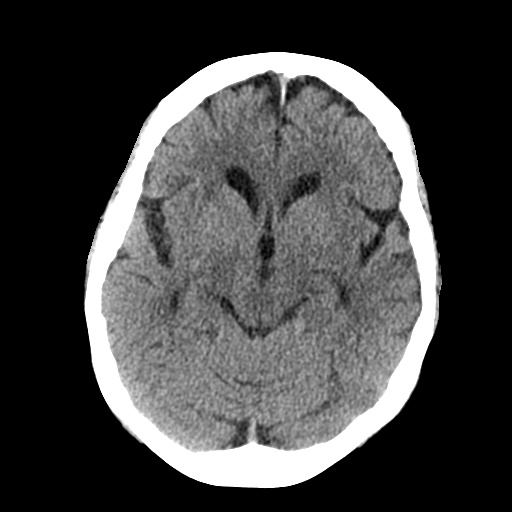
[im 13/29  brain]
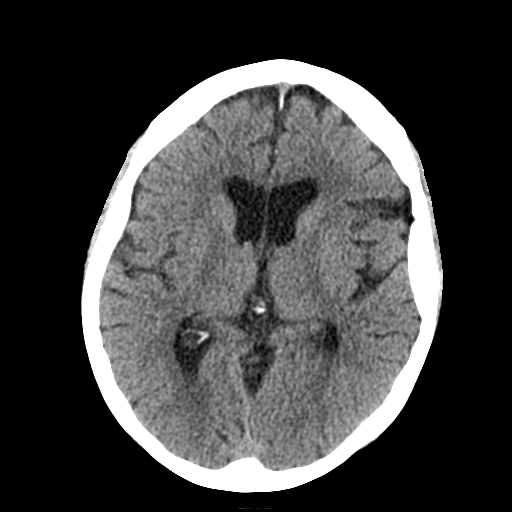
[im 17/29  brain]
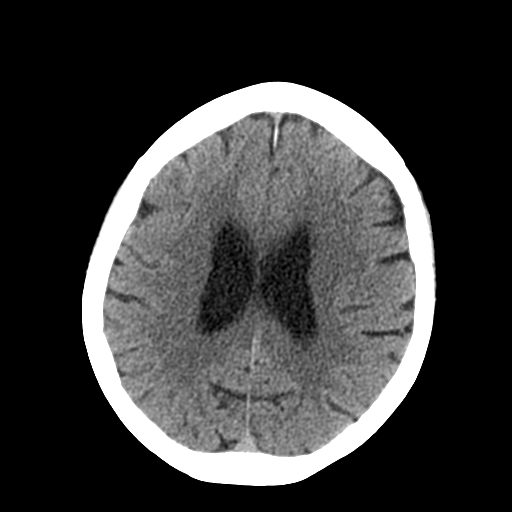
[im 17/29  bone]
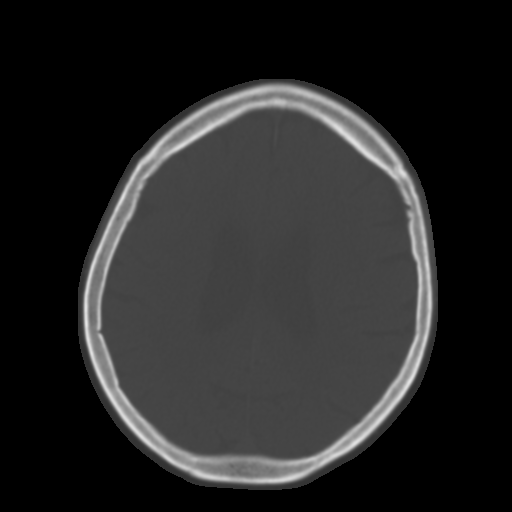
[im 20/29  brain]
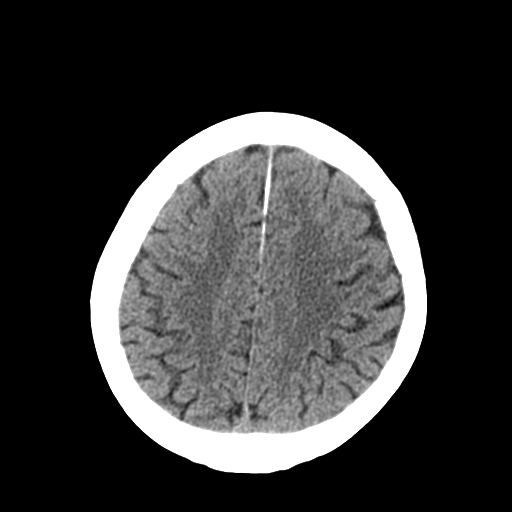
[im 23/29  brain]
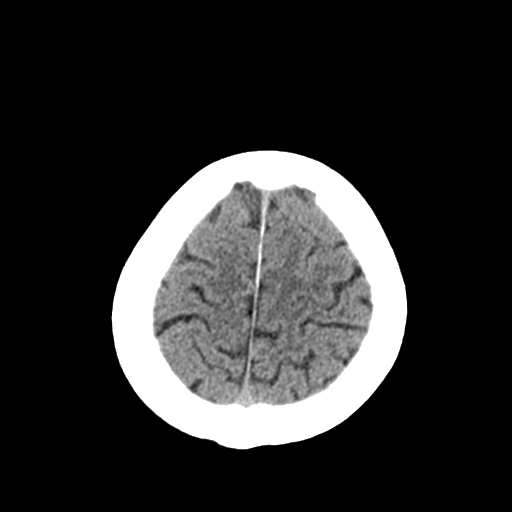
[im 27/29  brain]
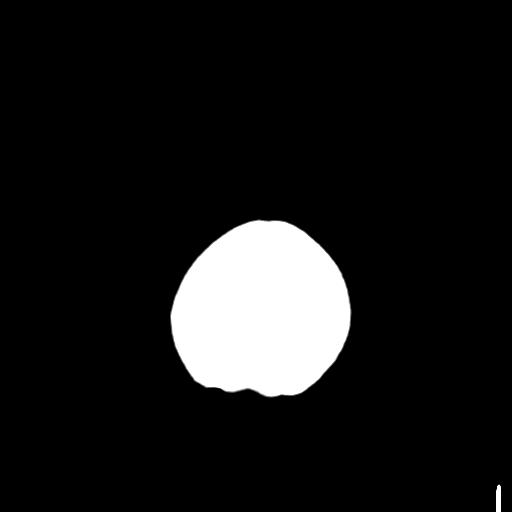

[Series 4: coronal soft tissue · coronal · 0.31mm/px · 3 of 59 slices shown]
[im 20/59  brain]
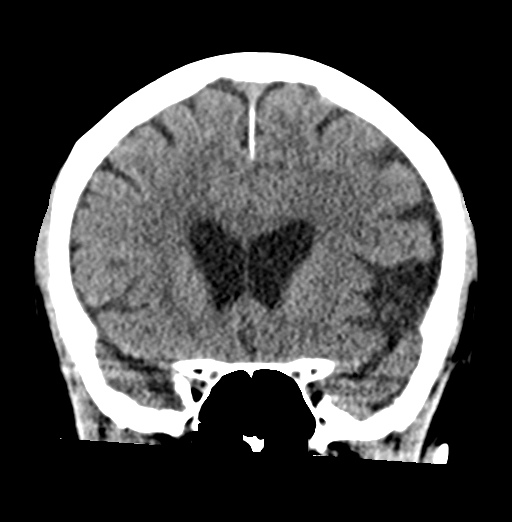
[im 26/59  brain]
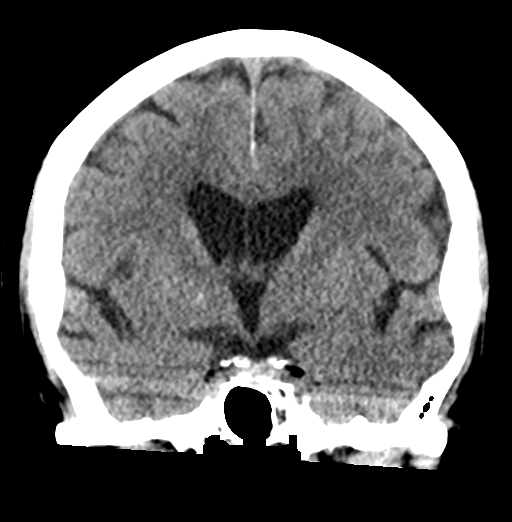
[im 33/59  brain]
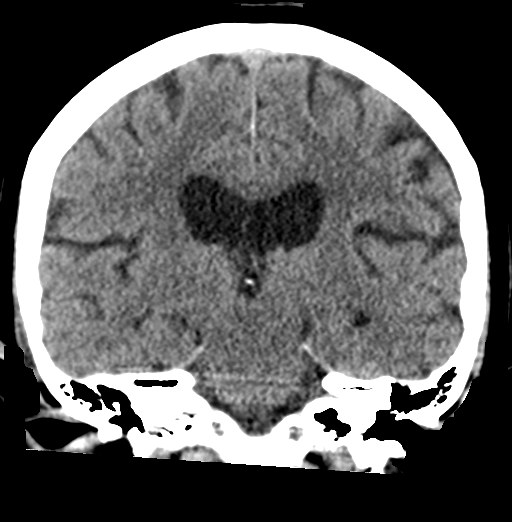

[Series 5: sagittal soft tissue · sagittal · 0.32mm/px · 3 of 54 slices shown]
[im 18/54  brain]
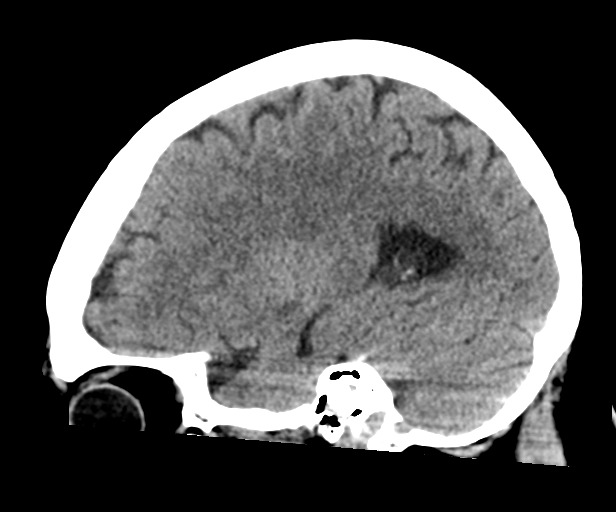
[im 27/54  brain]
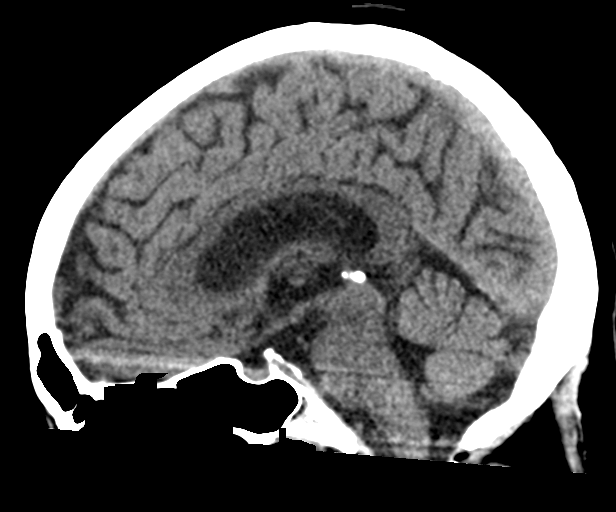
[im 36/54  brain]
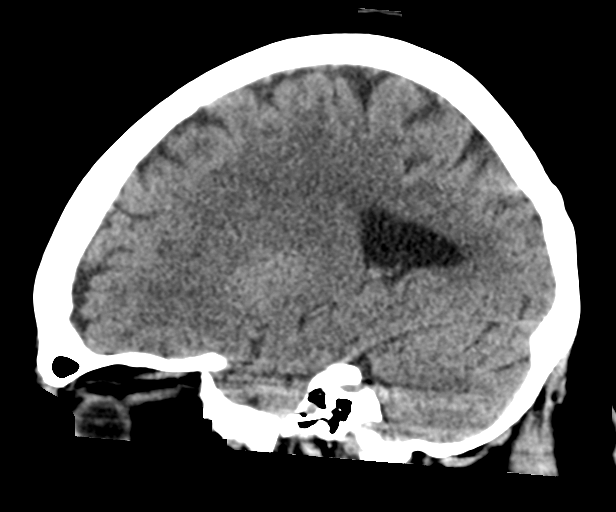

[14 of 46 positions shown; findings below may reference images not displayed]

FINDINGS: CT HEAD FINDINGS

Brain: Known focus of acute ischemia in the dorsal lateral left
thalamus is poorly visualized on CT examination, much better seen on
the more sensitive diffusion-weighted imaging performed several
hours prior. There is a remote left cerebellar lacunar infarct. No
new visible areas of ischemia. No evidence of acute hemorrhage,
hydrocephalus, extra-axial collection or mass lesion/mass effect.
Symmetric prominence of the ventricles, cisterns and sulci
compatible with parenchymal volume loss. Patchy areas of white
matter hypoattenuation are most compatible with chronic
microvascular angiopathy.

Vascular: Atherosclerotic calcification of the carotid siphons. No
hyperdense vessel.

Skull: No calvarial fracture or suspicious osseous lesion. No scalp
swelling or hematoma.

Sinuses/Orbits: Paranasal sinuses and mastoid air cells are
predominantly clear. Included orbital structures are unremarkable.

Other: None

CT CERVICAL SPINE FINDINGS

Alignment: Stabilization collar is absent at the time of
examination. Mild reversal of the normal cervical lordosis may in
part be due to slight neck flexion seen on the scout imaging. No
evidence of traumatic listhesis. No abnormally widened, perched or
jumped facets. Normal alignment of the craniocervical and
atlantoaxial articulations.

Skull base and vertebrae: No acute fracture. No primary bone lesion
or focal pathologic process.

Soft tissues and spinal canal: No pre or paravertebral fluid or
swelling. No visible canal hematoma. Airways patent.

Disc levels: Multilevel intervertebral disc height loss with
spondylitic endplate changes. Posterior disc osteophyte complexes at
the C3-4, C4-5 and C5-6 level result in at most mild canal stenosis
while more uncinate spurring and facet degenerative changes result
in moderate narrowing on the left C6-7 and on the right at C4-5 and
C5-6. No severe canal or foraminal narrowing.

Upper chest: No acute abnormality in the upper chest or imaged lung
apices. Extensive calcification of the proximal great vessels and
cervical carotids.

Other: Diffusely enlarged and heterogeneous thyroid gland.
IMPRESSION: 1. Known focus of acute ischemia in the dorsal lateral left thalamus
is poorly visualized on CT examination, much better seen on the more
sensitive diffusion-weighted imaging performed several hours prior.
2. No new visible areas of ischemia or acute intracranial
abnormality.
3. No significant scalp swelling or hematoma. No calvarial fracture.
4. Stable parenchymal volume loss and chronic microvascular ischemic
changes.
5. No acute fracture or malalignment of the cervical spine.
6. Multilevel degenerative disc disease and facet degenerative
changes of the cervical spine as described.
7. Diffusely enlarged and heterogeneous thyroid gland. Consider
further evaluation with thyroid ultrasound. This follows consensus
guidelines: Managing Incidental Thyroid Nodules Detected on Imaging:
White Paper of [REDACTED]. [HOSPITAL] [3W]; [DATE]. and REUBEN 3-tiered system for managing
ITNs: [HOSPITAL]. [DATE]): 143-50
8. Cervical and intracranial atherosclerosis.

## 2020-03-31 IMAGING — CT CT ANGIO NECK
1 of 13 series · 4 of 33 positions shown · IV contrast (omnipaque)
Comparison: MRI head [DATE].  CT head [DATE]
COMPARISON: MRI head [DATE].  CT head [DATE]
COMPARISON: MRI head [DATE].  CT head [DATE]

Addendum:
CLINICAL DATA: Follow-up stroke. Right-sided paresthesia. Left
thalamic infarct on MRI yesterday

EXAM:
CT ANGIOGRAPHY HEAD AND NECK
TECHNIQUE: Multidetector CT imaging of the head and neck was performed using
the standard protocol during bolus administration of intravenous
contrast. Multiplanar CT image reconstructions and MIPs were
obtained to evaluate the vascular anatomy. Carotid stenosis
measurements (when applicable) are obtained utilizing NASCET
criteria, using the distal internal carotid diameter as the
denominator.
CONTRAST:  75mL OMNIPAQUE IOHEXOL 350 MG/ML SOLN

[Series 9: cta head neck thins · axial · 0.35mm/px · z∈[-354,-158]mm · 4 of 675 slices shown]
[im 135/675  soft-tissue]
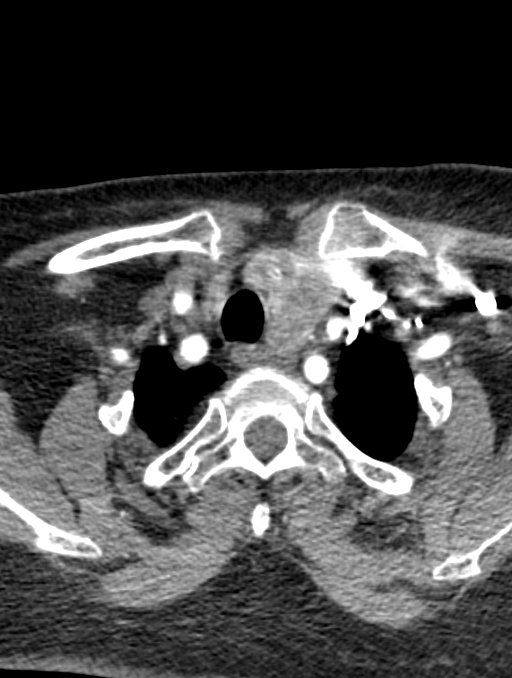
[im 270/675  bone]
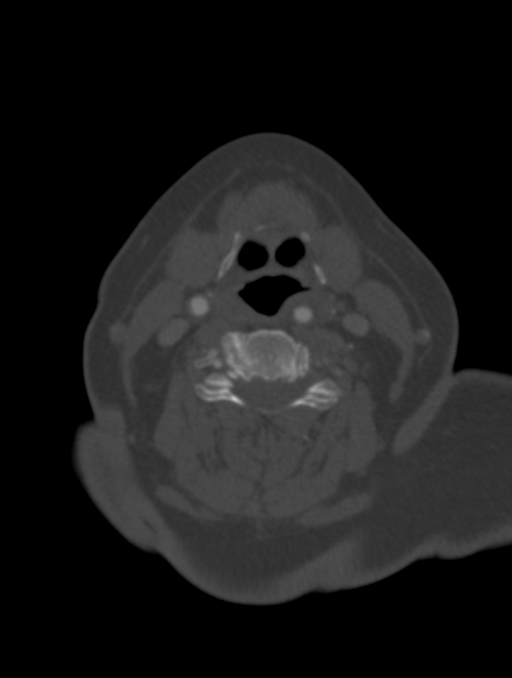
[im 405/675  soft-tissue]
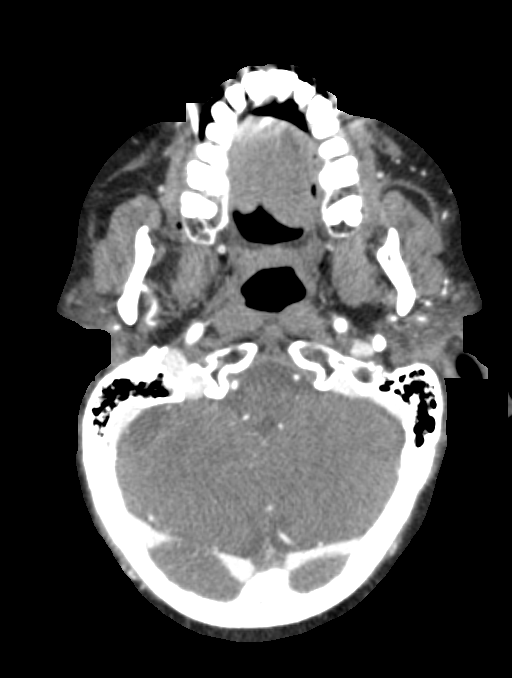
[im 540/675  bone]
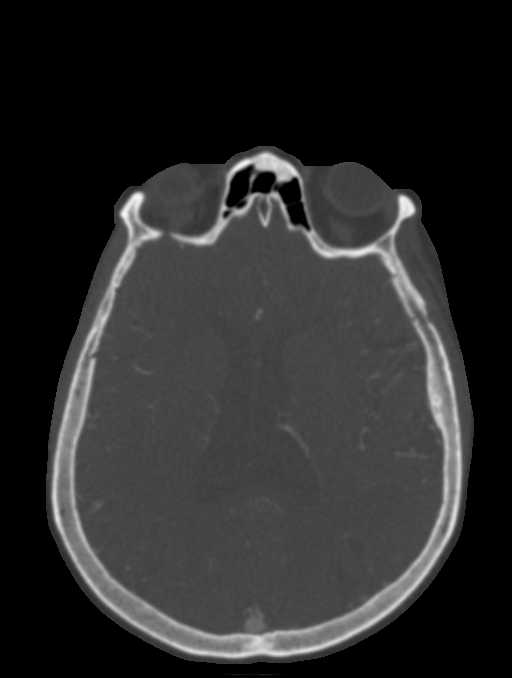

[4 of 33 positions shown; findings below may reference images not displayed]

FINDINGS: CT HEAD FINDINGS

Brain: Mild atrophy. Punctate acute infarct left thalamus seen on
MRI is not visualized on the current CT. No acute cortical infarct,
hemorrhage, or mass lesion. Ventricle size mildly prominent due to
atrophy. Small chronic infarct left cerebellum unchanged from MRI.

Vascular: Negative for hyperdense vessel

Skull: No focal skeletal lesion.

Sinuses: Paranasal sinuses clear.

Orbits: Bilateral cataract extraction.

Review of the MIP images confirms the above findings

CTA NECK FINDINGS

Aortic arch: Atherosclerotic aorta. Three-vessel arch. 50% diameter
stenosis proximal left common carotid artery. 50% diameter stenosis
proximal left subclavian artery.

Right carotid system: Atherosclerotic calcification right common
carotid artery with mild stenosis. Atherosclerotic calcification
right carotid bifurcation without significant stenosis.

Left carotid system: 50% diameter stenosis proximal left common
carotid artery. Scattered atherosclerotic calcification in the mid
and distal left common carotid artery without significant stenosis.
Mild atherosclerotic disease left carotid bifurcation without
significant stenosis.

Vertebral arteries: Right vertebral artery dominant. Moderate to
severe stenosis proximal right vertebral artery due to
atherosclerotic plaque. Remainder of the right vertebral artery is
widely patent.

Severe stenosis at the origin of the left vertebral artery. Left
vertebral artery is a small vessel which is diffusely diseased
proximally. Left vertebral artery is patent to the basilar.

Skeleton: No acute skeletal abnormality.  Cervical spondylosis.

Other neck: Multiple thyroid nodules. Largest nodule on the left
measures 20 mm. No adenopathy in the neck.

Upper chest: Lung apices clear bilaterally. Atherosclerotic aortic
arch.

Review of the MIP images confirms the above findings

CTA HEAD FINDINGS

Anterior circulation: Internal carotid artery widely patent through
the cavernous segment. Anterior and middle cerebral arteries patent
bilaterally without large vessel occlusion or flow limiting
stenosis.

Posterior circulation: Both vertebral arteries are small but patent
to the basilar. PICA patent bilaterally. Basilar is small and widely
patent. Fetal origin of the posterior cerebral artery bilaterally.
Superior cerebellar arteries patent bilaterally. Right posterior
cerebral artery patent without stenosis

Occlusion left P3 segment.  Moderate stenosis left P2 segment.

Venous sinuses: Normal venous enhancement

Anatomic variants: None

Review of the MIP images confirms the above findings
IMPRESSION: 1. CT head negative for acute abnormality. Small acute infarct in
the left thalamus seen on MRI last night is not visualized by CT. No
intracranial hemorrhage
2. Atherosclerotic aortic arch and proximal great vessels.
Atherosclerotic disease in the carotid bifurcation bilaterally
without significant stenosis.
3. Moderate to severe stenosis proximal right vertebral artery which
is then patent to the basilar. Severe stenosis proximal left
vertebral artery which is diffusely diseased and small but does
contribute to the basilar.
4. Occlusion left P3 segment.  Moderate stenosis left P2 segment
5. Anterior circulation widely patent.
6. Multiple thyroid nodules. The largest nodule on the left is 20
mm. Thyroid ultrasound recommended. (Ref: [HOSPITAL]. [8Q]

ADDENDUM:
Preliminary report texted to Dr. RASOIAN via AMION

ADDENDUM:
Dr. RASOIAN has just returned my page and I informed her of the CTA
results.

*** End of Addendum ***
Addendum:
FINDINGS: CT HEAD FINDINGS

Brain: Mild atrophy. Punctate acute infarct left thalamus seen on
MRI is not visualized on the current CT. No acute cortical infarct,
hemorrhage, or mass lesion. Ventricle size mildly prominent due to
atrophy. Small chronic infarct left cerebellum unchanged from MRI.

Vascular: Negative for hyperdense vessel

Skull: No focal skeletal lesion.

Sinuses: Paranasal sinuses clear.

Orbits: Bilateral cataract extraction.

Review of the MIP images confirms the above findings

CTA NECK FINDINGS

Aortic arch: Atherosclerotic aorta. Three-vessel arch. 50% diameter
stenosis proximal left common carotid artery. 50% diameter stenosis
proximal left subclavian artery.

Right carotid system: Atherosclerotic calcification right common
carotid artery with mild stenosis. Atherosclerotic calcification
right carotid bifurcation without significant stenosis.

Left carotid system: 50% diameter stenosis proximal left common
carotid artery. Scattered atherosclerotic calcification in the mid
and distal left common carotid artery without significant stenosis.
Mild atherosclerotic disease left carotid bifurcation without
significant stenosis.

Vertebral arteries: Right vertebral artery dominant. Moderate to
severe stenosis proximal right vertebral artery due to
atherosclerotic plaque. Remainder of the right vertebral artery is
widely patent.

Severe stenosis at the origin of the left vertebral artery. Left
vertebral artery is a small vessel which is diffusely diseased
proximally. Left vertebral artery is patent to the basilar.

Skeleton: No acute skeletal abnormality.  Cervical spondylosis.

Other neck: Multiple thyroid nodules. Largest nodule on the left
measures 20 mm. No adenopathy in the neck.

Upper chest: Lung apices clear bilaterally. Atherosclerotic aortic
arch.

Review of the MIP images confirms the above findings

CTA HEAD FINDINGS

Anterior circulation: Internal carotid artery widely patent through
the cavernous segment. Anterior and middle cerebral arteries patent
bilaterally without large vessel occlusion or flow limiting
stenosis.

Posterior circulation: Both vertebral arteries are small but patent
to the basilar. PICA patent bilaterally. Basilar is small and widely
patent. Fetal origin of the posterior cerebral artery bilaterally.
Superior cerebellar arteries patent bilaterally. Right posterior
cerebral artery patent without stenosis

Occlusion left P3 segment.  Moderate stenosis left P2 segment.

Venous sinuses: Normal venous enhancement

Anatomic variants: None

Review of the MIP images confirms the above findings
IMPRESSION: 1. CT head negative for acute abnormality. Small acute infarct in
the left thalamus seen on MRI last night is not visualized by CT. No
intracranial hemorrhage
2. Atherosclerotic aortic arch and proximal great vessels.
Atherosclerotic disease in the carotid bifurcation bilaterally
without significant stenosis.
3. Moderate to severe stenosis proximal right vertebral artery which
is then patent to the basilar. Severe stenosis proximal left
vertebral artery which is diffusely diseased and small but does
contribute to the basilar.
4. Occlusion left P3 segment.  Moderate stenosis left P2 segment
5. Anterior circulation widely patent.
6. Multiple thyroid nodules. The largest nodule on the left is 20
mm. Thyroid ultrasound recommended. (Ref: [HOSPITAL]. [8Q]

ADDENDUM:
Preliminary report texted to Dr. RASOIAN via [employer]ION

*** End of Addendum ***
FINDINGS: CT HEAD FINDINGS

Brain: Mild atrophy. Punctate acute infarct left thalamus seen on
MRI is not visualized on the current CT. No acute cortical infarct,
hemorrhage, or mass lesion. Ventricle size mildly prominent due to
atrophy. Small chronic infarct left cerebellum unchanged from MRI.

Vascular: Negative for hyperdense vessel

Skull: No focal skeletal lesion.

Sinuses: Paranasal sinuses clear.

Orbits: Bilateral cataract extraction.

Review of the MIP images confirms the above findings

CTA NECK FINDINGS

Aortic arch: Atherosclerotic aorta. Three-vessel arch. 50% diameter
stenosis proximal left common carotid artery. 50% diameter stenosis
proximal left subclavian artery.

Right carotid system: Atherosclerotic calcification right common
carotid artery with mild stenosis. Atherosclerotic calcification
right carotid bifurcation without significant stenosis.

Left carotid system: 50% diameter stenosis proximal left common
carotid artery. Scattered atherosclerotic calcification in the mid
and distal left common carotid artery without significant stenosis.
Mild atherosclerotic disease left carotid bifurcation without
significant stenosis.

Vertebral arteries: Right vertebral artery dominant. Moderate to
severe stenosis proximal right vertebral artery due to
atherosclerotic plaque. Remainder of the right vertebral artery is
widely patent.

Severe stenosis at the origin of the left vertebral artery. Left
vertebral artery is a small vessel which is diffusely diseased
proximally. Left vertebral artery is patent to the basilar.

Skeleton: No acute skeletal abnormality.  Cervical spondylosis.

Other neck: Multiple thyroid nodules. Largest nodule on the left
measures 20 mm. No adenopathy in the neck.

Upper chest: Lung apices clear bilaterally. Atherosclerotic aortic
arch.

Review of the MIP images confirms the above findings

CTA HEAD FINDINGS

Anterior circulation: Internal carotid artery widely patent through
the cavernous segment. Anterior and middle cerebral arteries patent
bilaterally without large vessel occlusion or flow limiting
stenosis.

Posterior circulation: Both vertebral arteries are small but patent
to the basilar. PICA patent bilaterally. Basilar is small and widely
patent. Fetal origin of the posterior cerebral artery bilaterally.
Superior cerebellar arteries patent bilaterally. Right posterior
cerebral artery patent without stenosis

Occlusion left P3 segment.  Moderate stenosis left P2 segment.

Venous sinuses: Normal venous enhancement

Anatomic variants: None

Review of the MIP images confirms the above findings
IMPRESSION: 1. CT head negative for acute abnormality. Small acute infarct in
the left thalamus seen on MRI last night is not visualized by CT. No
intracranial hemorrhage
2. Atherosclerotic aortic arch and proximal great vessels.
Atherosclerotic disease in the carotid bifurcation bilaterally
without significant stenosis.
3. Moderate to severe stenosis proximal right vertebral artery which
is then patent to the basilar. Severe stenosis proximal left
vertebral artery which is diffusely diseased and small but does
contribute to the basilar.
4. Occlusion left P3 segment.  Moderate stenosis left P2 segment
5. Anterior circulation widely patent.
6. Multiple thyroid nodules. The largest nodule on the left is 20
mm. Thyroid ultrasound recommended. (Ref: [HOSPITAL]. [8Q]

## 2020-03-31 MED ORDER — PERFLUTREN LIPID MICROSPHERE
1.0000 mL | INTRAVENOUS | Status: AC | PRN
  Administered 2020-03-31: 2 mL via INTRAVENOUS
  Filled 2020-03-31: qty 10

## 2020-03-31 MED ORDER — CLOPIDOGREL BISULFATE 75 MG PO TABS: 75.0000 mg | ORAL_TABLET | Freq: Every day | ORAL | 1 refills | Status: DC

## 2020-03-31 MED ORDER — IOHEXOL 350 MG/ML SOLN
75.0000 mL | Freq: Once | INTRAVENOUS | Status: AC | PRN
  Administered 2020-03-31: 75 mL via INTRAVENOUS
  Filled 2020-03-31: qty 75

## 2020-03-31 NOTE — Evaluation (Signed)
Occupational Therapy Evaluation Patient Details Name: Melanie Ray MRN: [id number] DOB: 11-29-1950 Today's Date: 03/31/2020    History of Present Illness Melanie Ray is a 69 year old female who presented to ER secondary to cough, SOB, LE edema, R side weakness/numbness and loss of balance; admitted for TIA/CVA work up.  MRI significant for R cerebellum and L thalamic infarct   Clinical Impression   Melanie Ray presents to OT with dizziness and impaired sensation that impact her ability to safely and independently complete functional tasks.  Prior to admission, pt was independent in all ADLs and IADLs including driving, no AD used. Currently, pt requires grossly supervision to min guard assist for standing ADLs 2/2 reports of dizziness, and stand by assist for ADLs requiring fine motor control 2/2 numbness/tingling in RUE.  Pt presents with ability to detect light touch on RUE, but reports tingling and sensation is diminished compared to LUE.  Pt reports normal sensation on RLE.  Pt also endorses dizziness when sitting upright.  Pt presents with good BUE strength, but diminished fine motor control in R hand 2/2 reports of tingling.  No visual or cognitive deficits observed.  Melanie Ray will continue to benefit from skilled OT services in acute setting to address balance, decreased sensation, and fine motor control.  Do not anticipate additional OT needs upon discharge.    Follow Up Recommendations  No OT follow up    Equipment Recommendations  Tub/shower seat    Recommendations for Other Services       Precautions / Restrictions Precautions Precautions: Fall Restrictions Weight Bearing Restrictions: No      Mobility Bed Mobility Overal bed mobility: Modified Independent                Transfers Overall transfer level: Modified independent Equipment used: None                  Balance Overall balance assessment: Needs assistance Sitting-balance  support: No upper extremity supported;Feet supported Sitting balance-Leahy Scale: Good     Standing balance support: No upper extremity supported Standing balance-Leahy Scale: Good                             ADL either performed or assessed with clinical judgement   ADL Overall ADL's : Needs assistance/impaired                                       General ADL Comments: Pt grossly requires supervision/min guard assist with ADLs 2/2 dizziness/impaired balance     Vision Baseline Vision/History: Wears glasses Wears Glasses: Reading only Patient Visual Report: No change from baseline Vision Assessment?: Yes Eye Alignment: Within Functional Limits Ocular Range of Motion: Within Functional Limits Alignment/Gaze Preference: Within Defined Limits Tracking/Visual Pursuits: Able to track stimulus in all quads without difficulty Saccades: Within functional limits Convergence: Within functional limits Visual Fields: No apparent deficits     Perception     Praxis      Pertinent Vitals/Pain Pain Assessment: No/denies pain     Hand Dominance Left   Extremity/Trunk Assessment Upper Extremity Assessment Upper Extremity Assessment: Overall WFL for tasks assessed(grossly 4+ to 5/5 throughout; no focal weakness appreciated. Does endorse persistent paresthesia R UE and face (detects light touch, but diminished compared to L))   Lower Extremity Assessment Lower Extremity Assessment: Overall WFL for tasks assessed(grossly  4+ to 5/5 throughout; no focal weakness, sensory or coordination deficits)       Communication Communication Communication: No difficulties   Cognition Arousal/Alertness: Awake/alert Behavior During Therapy: WFL for tasks assessed/performed Overall Cognitive Status: Within Functional Limits for tasks assessed                                 General Comments: grossly oriented, pleasant and engaged in therapy   General  Comments       Exercises Other Exercises Other Exercises: provided education re: OT role and plan of care, fall and safety precautions, self care   Shoulder Instructions      Home Living Family/patient expects to be discharged to:: Private residence Living Arrangements: Spouse/significant other Available Help at Discharge: Family;Available 24 hours/day Type of Home: House Home Access: Stairs to enter [employer] of Steps: 4 Entrance Stairs-Rails: Right;Left Home Layout: Two level;Bed/bath upstairs;Able to live on main level with bedroom/bathroom     Bathroom Shower/Tub: Occupational psychologist: Standard     Home Equipment: None      Lives With: Spouse    Prior Functioning/Environment Level of Independence: Independent        Comments: Indep with ADLs, household and community mobilization without assist device; + driving, denies fall history.  Retired Financial controller.  Pt enjoys drinking coffee with her husband, playing with her dog, and running errands.        OT Problem List: Impaired balance (sitting and/or standing);Impaired sensation      OT Treatment/Interventions: Self-care/ADL training;Therapeutic exercise;Neuromuscular education;DME and/or AE instruction;Therapeutic activities;Patient/family education;Balance training    OT Goals(Current goals can be found in the care plan section) Acute Rehab OT Goals Patient Stated Goal: to return home OT Goal Formulation: With patient Time For Goal Achievement: 04/14/20 Potential to Achieve Goals: Good  OT Frequency: Min 1X/week   Barriers to D/C:            Co-evaluation              AM-PAC OT "6 Clicks" Daily Activity     Outcome Measure Help from another person eating meals?: None Help from another person taking care of personal grooming?: None Help from another person toileting, which includes using toliet, bedpan, or urinal?: A Little Help from another person bathing (including  washing, rinsing, drying)?: A Little Help from another person to put on and taking off regular upper body clothing?: None Help from another person to put on and taking off regular lower body clothing?: A Little 6 Click Score: 21   End of Session    Activity Tolerance: Patient tolerated treatment well Patient left: in bed;with call bell/phone within reach  OT Visit Diagnosis: Unsteadiness on feet (R26.81);Other symptoms and signs involving the nervous system [id number])                Time: [id number] OT Time Calculation (min): 10 min Charges:  OT General Charges $OT Visit: 1 Visit OT Evaluation $OT Eval Moderate Complexity: 1 Mod  Trinten Boudoin Wells Nichalos Brenton, OTR/L 03/31/20, 3:23 PM

## 2020-03-31 NOTE — ED Notes (Signed)
Called pharmacy to send medications that are not available in pyxis - Awaiting medication to arrive from pharmacy

## 2020-03-31 NOTE — ED Notes (Signed)
Pt alert, resting calmly in bed. Bed locked low. Rails up. Call bell within reach.

## 2020-03-31 NOTE — Evaluation (Signed)
Speech Language Pathology Evaluation Patient Details Name: Takoda Siedlecki MRN: [id number] DOB: 1951-08-12 Today's Date: 03/31/2020 Time: 6301-6010 SLP Time Calculation (min) (ACUTE ONLY): 16 min  Problem List:  Patient Active Problem List   Diagnosis Date Noted  . CVA (cerebrovascular accident) ([place] [place]) 03/30/2020  . Positive colorectal cancer screening using Cologuard test 03/01/2020  . Nightmares 03/01/2020  . Left bundle branch block 12/03/2018  . QT prolongation 12/03/2018  . Obsessive compulsive disorder 08/27/2018  . Skin lesion 02/07/2018  . Hot flash, menopausal 02/07/2018  . Vitamin D deficiency 06/17/2017  . Class 1 obesity without serious comorbidity with body mass index (BMI) of 30.0 to 30.9 in adult 06/17/2017  . Hot flashes 04/23/2017  . Dizziness 01/03/2017  . Ischemic cardiomyopathy 10/02/2016  . CAD (coronary artery disease) 04/11/2016  . Chronic systolic CHF (congestive heart failure) ([place]) 03/21/2016  . History of acute anterior wall MI 03/18/2016  . DM type 2 (diabetes mellitus, type 2) ([place]) 08/03/2015  . Essential hypertension 07/28/2015  . HLD (hyperlipidemia) 07/28/2015  . Glaucoma 07/28/2015  . Depression 07/28/2015  . Anxiety 07/28/2015  . Insomnia 07/28/2015  . Obesity (BMI 30.0-34.9) 07/28/2015  . 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 Polyp of colon 04/27/2008  . Uterine leiomyoma 04/27/2008  . Generalized anxiety disorder 04/27/2008  . History of colonoscopy 04/27/2008  . Osteopenia 04/27/2008  . Other specified disorder of bladder 04/27/2008  . Benign neoplasm of colon 04/27/2008   Past Medical History:  Past Medical History:  Diagnosis Date  . Anemia   . Anxiety   . Back pain   . CAD (coronary artery disease) 04/11/2016   S/p ant STEMI 5/17: LHC >> LAD proximal 80%, mid 80%, distal 50%, ostial D1 60%; LCx with LPDA lesion 30%; RCA Mild calcification with no significant stenosis in a medium caliber, nondominant RCA; LVEF is  estimated at 45% with inferoapical and lateral wall akinesis >> PCI: PCI: 3.5 x 24 mm Promus DES to prox LAD, 2.5 x 12 mm Promus DES to mid LAD.   [name] Chest pain   . Chronic systolic CHF (congestive heart failure) ([place]) 03/21/2016   Echo 01/30/17: Diff HK, mild focal basal septal hypertrophy, EF 30-35, mild AI, MAC, mild MR // Echo 06/08/16: Mild focal basal septal hypertrophy, EF 25-30%, diff HK, ant-septal AK, Gr 1 DD, mild AI, MAC, mild MR, PASP 37 mmHg // Echo 03/18/16: EF 30-35%, ant-septal AK, Gr 1 DD, mild MR, severe LAE.  [name] Constipation   . Depression   . Diabetes mellitus type 2 in obese ([place])   . Dizziness   . Glaucoma   . Gout   . Heart disease   . Heartburn   . History of acute anterior wall MI 03/17/2016  . History of heart attack   . Hyperlipemia   . Hypertension   . Ischemic cardiomyopathy 10/02/2016   Refused ICD  . Joint pain   . Left bundle branch block   . Myocardial infarction ([place])   . Nausea   . Sleep apnea   . SOB (shortness of breath)   . Suicidal ideation 08/27/2018  . Swelling    feet or legs   Past Surgical History:  Past Surgical History:  Procedure Laterality Date  . APPENDECTOMY    . CARDIAC CATHETERIZATION N/A 03/17/2016   Procedure: Left Heart Cath and Coronary Angiography;  Surgeon: Sherren Mocha, MD;  Location: [facility];  Service: Cardiovascular;  Laterality: N/A;  . CARDIAC CATHETERIZATION N/A 03/17/2016   Procedure: Coronary  Stent Intervention;  Surgeon: Sherren Mocha, MD;  Location: [facility];  Service: Cardiovascular;  Laterality: N/A;  . COLONOSCOPY WITH PROPOFOL N/A 08/13/2017   Procedure: COLONOSCOPY WITH PROPOFOL;  Surgeon: Jonathon Bellows, MD;  Location[facility] ENDOSCOPY;  Service: Gastroenterology;  Laterality: N/A;  . COLONOSCOPY WITH PROPOFOL N/A 03/29/2020   Procedure: COLONOSCOPY WITH PROPOFOL;  Surgeon: Lucilla Lame, MD;  Location: [facility] ENDOSCOPY;  Service: Endoscopy;  Laterality: N/A;  . SMALL BOWEL REPAIR    .  TONSILLECTOMY    . UTERINE FIBROID SURGERY     HPI:  Robbin Loughmiller is a 69 y.o. female with extensive past medical history including hypertension, hyperlipidemia, coronary disease, on aspirin, here with right facial numbness and right arm numbness as well as some loss of balance since last Saturday/Sunday.  She has also noticed some slight occasional difficulty finding words, loss of balance, and difficulty getting around.  She has had some mild headaches. MRI revealed punctate focus of acute ischemia within the dorsolateral left thalamus, no hemorrhage or mass effect and small, old left cerebellar infarct and findings of chronic ischemic microangiopathy..   Assessment / Plan / Recommendation Clinical Impression  Pt presents with cognitive linguistic abilities that are deemed to be within functional limits. Pt reports occasional higher level word finding deficits. During this evaluation, pt was free of these deficits even when conveying complex abstract ideas. She is able to communicate and comprehend medical infomration and participate in medical plan of care.  Education has been completed and all questions answered to pt's satisfaction.  Therefore skilled ST is not indicated at this time.    SLP Assessment  SLP Recommendation/Assessment: Patient does not need any further Speech Lanaguage Pathology Services SLP Visit Diagnosis: Cognitive communication deficit (R41.841)    Follow Up Recommendations  None    Frequency and Duration   N/A        SLP Evaluation Cognition  Overall Cognitive Status: Within Functional Limits for tasks assessed Arousal/Alertness: Awake/alert Orientation Level: Oriented X4       Comprehension  Auditory Comprehension Overall Auditory Comprehension: Appears within functional limits for tasks assessed Visual Recognition/Discrimination Discrimination: Not tested Reading Comprehension Reading Status: Not tested    Expression Expression Primary Mode of  Expression: Verbal Verbal Expression Overall Verbal Expression: Appears within functional limits for tasks assessed Written Expression Dominant Hand: Left Written Expression: Not tested   Oral / Motor  Oral Motor/Sensory Function Overall Oral Motor/Sensory Function: Within functional limits Motor Speech Overall Motor Speech: Appears within functional limits for tasks assessed   GO                   Happi B. Rutherford Nail M.S., CCC-SLP, [place] Office [phone]  Stormy Fabian 03/31/2020, 2:54 PM

## 2020-03-31 NOTE — ED Notes (Signed)
Carotid US completed.

## 2020-03-31 NOTE — ED Notes (Signed)
Pt alert. Resting in bed. Bed locked low. Rail up.

## 2020-03-31 NOTE — Consult Note (Signed)
Requesting Physician: Reesa Chew    Chief Complaint: Right sided numbness  I have been asked by Dr. Reesa Chew to see this patient in consultation for acute infarct.  HPI: Melanie Ray is an 69 y.o. female  with medical history significant of HTN,CAD/ MI,DM II,Dyslipidemia seen in ED for right sided numbness.  Patient reports that Friday she was told she had a cavity while at her routine dental cleaning appointment.  On Saturday patient started to have tingling affecting the right side of her face the right side of her arm and upper extremity extending to the fingers the right side of her breast chest wall and abdomen.  She thought that since the cavity was so close to the nerve it is probably the cavity that is causing the tingling and she called the dentist.  The dentist office told the patient that it sounds like she is having a stroke and that she should be evaluated, at that time the patient went to the urgent care and was told the same thing and EMS was called and asked patient to bring to the emergency room for evaluation of acute CVA.  Patient also reports that yesterday while in the waiting area she was having trouble with her balance and her spouse had to catch her. Initial NIHSS of 3.  Date last known well: 03/26/2020 Time last known well: Unable to determine tPA Given: No: Outside time window  Past Medical History:  Diagnosis Date  . Anemia   . Anxiety   . Back pain   . CAD (coronary artery disease) 04/11/2016   S/p ant STEMI 5/17: LHC >> LAD proximal 80%, mid 80%, distal 50%, ostial D1 60%; LCx with LPDA lesion 30%; RCA Mild calcification with no significant stenosis in a medium caliber, nondominant RCA; LVEF is estimated at 45% with inferoapical and lateral wall akinesis >> PCI: PCI: 3.5 x 24 mm Promus DES to prox LAD, 2.5 x 12 mm Promus DES to mid LAD.   [name] Chest pain   . Chronic systolic CHF (congestive heart failure) ([place]) 03/21/2016   Echo 01/30/17: Diff HK, mild focal basal septal  hypertrophy, EF 30-35, mild AI, MAC, mild MR // Echo 06/08/16: Mild focal basal septal hypertrophy, EF 25-30%, diff HK, ant-septal AK, Gr 1 DD, mild AI, MAC, mild MR, PASP 37 mmHg // Echo 03/18/16: EF 30-35%, ant-septal AK, Gr 1 DD, mild MR, severe LAE.  [name] Constipation   . Depression   . Diabetes mellitus type 2 in obese ([place])   . Dizziness   . Glaucoma   . Gout   . Heart disease   . Heartburn   . History of acute anterior wall MI 03/17/2016  . History of heart attack   . Hyperlipemia   . Hypertension   . Ischemic cardiomyopathy 10/02/2016   Refused ICD  . Joint pain   . Left bundle branch block   . Myocardial infarction ([place])   . Nausea   . Sleep apnea   . SOB (shortness of breath)   . Suicidal ideation 08/27/2018  . Swelling    feet or legs    Past Surgical History:  Procedure Laterality Date  . APPENDECTOMY    . CARDIAC CATHETERIZATION N/A 03/17/2016   Procedure: Left Heart Cath and Coronary Angiography;  Surgeon: Sherren Mocha, MD;  Location: [facility];  Service: Cardiovascular;  Laterality: N/A;  . CARDIAC CATHETERIZATION N/A 03/17/2016   Procedure: Coronary Stent Intervention;  Surgeon: Sherren Mocha, MD;  Location: [facility] INVASIVE CV  LAB;  Service: Cardiovascular;  Laterality: N/A;  . COLONOSCOPY WITH PROPOFOL N/A 08/13/2017   Procedure: COLONOSCOPY WITH PROPOFOL;  Surgeon: Anna, Kiran, MD;  Location: [facility] ENDOSCOPY;  Service: Gastroenterology;  Laterality: N/A;  . COLONOSCOPY WITH PROPOFOL N/A 03/29/2020   Procedure: COLONOSCOPY WITH PROPOFOL;  Surgeon: Wohl, Darren, MD;  Location: [facility] ENDOSCOPY;  Service: Endoscopy;  Laterality: N/A;  . SMALL BOWEL REPAIR    . TONSILLECTOMY    . UTERINE FIBROID SURGERY      Family History  Problem Relation Age of Onset  . Anxiety disorder Mother   . Paranoid behavior Mother   . Hypertension Mother   . Heart failure Mother   . Stroke Mother   . Dementia Mother   . High Cholesterol Mother   . Diabetes Mother   .  Hyperlipidemia Mother   . Heart disease Mother   . Depression Mother   . Hypertension Father   . High Cholesterol Father   . Mood Disorder Sister   . Stroke Sister   . Anxiety disorder Maternal Aunt   . Drug abuse Cousin   . Tuberculosis Paternal Grandfather    Social History:  reports that she quit smoking about 22 years ago. Her smoking use included cigarettes. She has never used smokeless tobacco. She reports current alcohol use of about 1.0 standard drinks of alcohol per week. She reports that she does not use drugs.  Allergies:  Allergies  Allergen Reactions  . Tetracycline Swelling  . Meperidine Nausea And Vomiting    Nausea    Medications: I have reviewed the patient's current medications. Prior to Admission medications   Medication Sig Start Date End Date Taking? Authorizing Provider  aspirin EC 81 MG EC tablet Take 1 tablet (81 mg total) by mouth daily. 03/21/16  Yes Simmons, Brittainy M, PA-C  atorvastatin (LIPITOR) 80 MG tablet Take 1 tablet by mouth once daily Patient taking differently: Take 80 mg by mouth daily.  02/19/20  Yes Weaver, Scott T, PA-C  carvedilol (COREG) 12.5 MG tablet TAKE 1 TABLET BY MOUTH TWICE DAILY WITH MEALS Patient taking differently: Take 12.5 mg by mouth 2 (two) times daily with a meal.  03/23/20  Yes Weaver, Scott T, PA-C  Cyanocobalamin (VITAMIN B-12 PO) Take 1 capsule by mouth daily.   Yes [provider]  dorzolamide-timolol (COSOPT) 22.3-6.8 MG/ML ophthalmic solution Place 1 drop into both eyes 2 (two) times daily.  01/17/15  Yes [provider]  isosorbide mononitrate (IMDUR) 30 MG 24 hr tablet Take 1 tablet (30 mg total) by mouth daily. 01/06/20 01/05/21 Yes Cooper, Michael, MD  latanoprost (XALATAN) 0.005 % ophthalmic solution Place 1 drop into both eyes at bedtime. 03/15/20  Yes [provider]  LATUDA 60 MG TABS Take 60 mg by mouth daily with breakfast.  04/28/19  Yes [provider]  LORazepam (ATIVAN) 1 MG  tablet Take 1 mg by mouth at bedtime as needed for sleep.   Yes [provider]  metFORMIN (GLUCOPHAGE) 500 MG tablet TAKE 1 TABLET BY MOUTH TWICE DAILY WITH  A  MEAL Patient taking differently: Take 500 mg by mouth 2 (two) times daily with a meal.  02/01/20  Yes Sonnenberg, Eric G, MD  Multiple Vitamin (MULTI-VITAMIN DAILY) TABS Take 1 tablet by mouth daily.   Yes [provider]  olmesartan (BENICAR) 20 MG tablet Take 20 mg by mouth daily.   Yes [provider]  spironolactone (ALDACTONE) 25 MG tablet Take 1/2 (one-half) tablet by mouth once   daily Patient taking differently: Take 12.5 mg by mouth daily. TAKE 1/2 (ONE-HALF) TABLET BY MOUTH ONCE DAILY 03/07/20  Yes Cooper, Michael, MD  traZODone (DESYREL) 100 MG tablet Take 100 mg by mouth at bedtime as needed for sleep.   Yes [provider]  venlafaxine XR (EFFEXOR-XR) 150 MG 24 hr capsule TAKE 2 CAPSULES BY MOUTH ONCE DAILY (NEEDS  APPOINTMENT  FOR  FURTHER  REFILLS) Patient taking differently: Take 300 mg by mouth daily with breakfast.  03/13/19  Yes Eappen, Saramma, MD  blood glucose meter kit and supplies KIT Dispense based on patient and insurance preference.  Check glucose once daily fasting in the morning. (FOR ICD 10 E11.9). 10/16/19   Sonnenberg, Eric G, MD  furosemide (LASIX) 40 MG tablet Take 1 tablet (40 mg total) by mouth as directed. 40 mg on Mon, Wed and Fri's; all other days 20 mg 12/30/18 02/17/20  Weaver, Scott T, PA-C  nitroGLYCERIN (NITROSTAT) 0.4 MG SL tablet Place 1 tablet (0.4 mg total) under the tongue every 5 (five) minutes x 3 doses as needed for chest pain (Do not exceed 3 doses). 12/30/18   Weaver, Scott T, PA-C  OLANZapine (ZYPREXA) 5 MG tablet Take 1 tablet (5 mg total) by mouth at bedtime. For anxiety and sleep Patient not taking: Reported on 03/30/2020 01/05/19   Eappen, Saramma, MD  ondansetron (ZOFRAN) 4 MG tablet TAKE 1 TABLET BY MOUTH ONCE DAILY AS NEEDED FOR NAUSEA AND VOMITING Patient  not taking: Reported on 03/29/2020 05/29/18   Sonnenberg, Eric G, MD    ROS: History obtained from the patient  General ROS: negative for - chills, fatigue, fever, night sweats, weight gain or weight loss Psychological ROS: negative for - behavioral disorder, hallucinations, memory difficulties, mood swings or suicidal ideation Ophthalmic ROS: negative for - blurry vision, double vision, eye pain or loss of vision ENT ROS: negative for - epistaxis, nasal discharge, oral lesions, sore throat, tinnitus or vertigo Allergy and Immunology ROS: negative for - hives or itchy/watery eyes Hematological and Lymphatic ROS: negative for - bleeding problems, bruising or swollen lymph nodes Endocrine ROS: negative for - galactorrhea, hair pattern changes, polydipsia/polyuria or temperature intolerance Respiratory ROS: negative for - cough, hemoptysis, shortness of breath or wheezing Cardiovascular ROS: negative for - chest pain, dyspnea on exertion, edema or irregular heartbeat Gastrointestinal ROS: negative for - abdominal pain, diarrhea, hematemesis, nausea/vomiting or stool incontinence Genito-Urinary ROS: negative for - dysuria, hematuria, incontinence or urinary frequency/urgency Musculoskeletal ROS: negative for - joint swelling or muscular weakness Neurological ROS: as noted in HPI Dermatological ROS: negative for rash and skin lesion changes  Physical Examination: Blood pressure (!) 159/81, pulse 86, temperature 98.1 F (36.7 C), temperature source Oral, resp. rate 20, height 5' 3" (1.6 m), weight 75.8 kg, SpO2 96 %.  HEENT-  Normocephalic, no lesions, without obvious abnormality.  Normal external eye and conjunctiva.  Normal TM's bilaterally.  Normal auditory canals and external ears. Normal external nose, mucus membranes and septum.  Normal pharynx. Cardiovascular- S1, S2 normal, pulses palpable throughout   Lungs- chest clear, no wheezing, rales, normal symmetric air entry Abdomen- soft,  non-tender; bowel sounds normal; no masses,  no organomegaly Extremities- no edema Lymph-no adenopathy palpable Musculoskeletal-no joint tenderness, deformity or swelling Skin-warm and dry, no hyperpigmentation, vitiligo, or suspicious lesions  Neurological Examination   Mental Status: Alert, oriented, thought content appropriate.  Speech fluent without evidence of aphasia.  Able to follow 3 step commands without difficulty. Cranial Nerves: II: Visual fields   grossly normal, pupils equal, round, reactive to light and accommodation III,IV, VI: ptosis not present, extra-ocular motions intact bilaterally V,VII: smile symmetric, facial light touch sensation decreased on the right VIII: hearing normal bilaterally IX,X: gag reflex present XI: bilateral shoulder shrug XII: midline tongue extension Motor: Right : Upper extremity   5/5    Left:     Upper extremity   5/5  Lower extremity   5/5     Lower extremity   5/5 Tone and bulk:normal tone throughout; no atrophy noted Sensory: Pinprick and light touch intact throughout, bilaterally Deep Tendon Reflexes: Symmetric throughout Plantars: Right: mute   Left: mute Cerebellar: Normal finger-to-nose and normal heel-to-shin testing bilaterally Gait: normal gait and station    Laboratory Studies:  Basic Metabolic Panel: Recent Labs  Lab 03/30/20 1236  NA 137  K 4.0  CL 105  CO2 24  GLUCOSE 90  BUN 19  CREATININE 0.94  CALCIUM 9.7    Liver Function Tests: Recent Labs  Lab 03/30/20 1236  AST 18  ALT 18  ALKPHOS 123  BILITOT 0.6  PROT 7.5  ALBUMIN 4.1   No results for input(s): LIPASE, AMYLASE in the last 168 hours. No results for input(s): AMMONIA in the last 168 hours.  CBC: Recent Labs  Lab 03/30/20 1236  WBC 6.6  NEUTROABS 3.6  HGB 11.9*  HCT 36.4  MCV 80.4  PLT 319    Cardiac Enzymes: No results for input(s): CKTOTAL, CKMB, CKMBINDEX, TROPONINI in the last 168 hours.  BNP: Invalid input(s):  POCBNP  CBG: Recent Labs  Lab 03/29/20 0723 03/31/20 0350 03/31/20 0740  GLUCAP 129* 94 80    Microbiology: Results for orders placed or performed during the hospital encounter of 03/30/20  SARS Coronavirus 2 by RT PCR (hospital order, performed in [facility] hospital lab) Nasopharyngeal Nasopharyngeal Swab     Status: None   Collection Time: 03/30/20  6:24 PM   Specimen: Nasopharyngeal Swab  Result Value Ref Range Status   SARS Coronavirus 2 NEGATIVE NEGATIVE Final    Comment: (NOTE) SARS-CoV-2 target nucleic acids are NOT DETECTED. The SARS-CoV-2 RNA is generally detectable in upper and lower respiratory specimens during the acute phase of infection. The lowest concentration of SARS-CoV-2 viral copies this assay can detect is 250 copies / mL. A negative result does not preclude SARS-CoV-2 infection and should not be used as the sole basis for treatment or other patient management decisions.  A negative result may occur with improper specimen collection / handling, submission of specimen other than nasopharyngeal swab, presence of viral mutation(s) within the areas targeted by this assay, and inadequate number of viral copies (<250 copies / mL). A negative result must be combined with clinical observations, patient history, and epidemiological information. Fact Sheet for Patients:   StrictlyIdeas.no Fact Sheet for Healthcare Providers: BankingDealers.co.za This test is not yet approved or cleared  by the [place] FDA and has been authorized for detection and/or diagnosis of SARS-CoV-2 by FDA under an Emergency Use Authorization (EUA).  This EUA will remain in effect (meaning this test can be used) for the duration of the COVID-19 declaration under Section 564(b)(1) of the Act, 21 U.S.C. section 360bbb-3(b)(1), unless the authorization is terminated or revoked sooner. Performed at [facility], [address] [place], [place] [address]     Coagulation Studies: Recent Labs    03/30/20 1236  LABPROT 13.1  INR 1.0    Urinalysis: No results for input(s): COLORURINE, LABSPEC, [place], GLUCOSEU,  HGBUR, BILIRUBINUR, KETONESUR, PROTEINUR, UROBILINOGEN, NITRITE, LEUKOCYTESUR in the last 168 hours.  Invalid input(s): APPERANCEUR  Lipid Panel:    Component Value Date/Time   CHOL 126 03/31/2020 0514   CHOL 154 04/07/2019 1043   TRIG 74 03/31/2020 0514   HDL 34 (L) 03/31/2020 0514   HDL 39 (L) 04/07/2019 1043   CHOLHDL 3.7 03/31/2020 0514   VLDL 15 03/31/2020 0514   LDLCALC 77 03/31/2020 0514   LDLCALC 92 04/07/2019 1043    HgbA1C:  Lab Results  Component Value Date   HGBA1C 6.5 (H) 03/31/2020    Urine Drug Screen:  No results found for: LABOPIA, COCAINSCRNUR, LABBENZ, AMPHETMU, THCU, LABBARB  Alcohol Level: No results for input(s): ETH in the last 168 hours.  Other results: EKG: normal sinus rhythm at 69 bpm, RBBB.  Imaging: CT ANGIO HEAD W OR WO CONTRAST  Addendum Date: 03/31/2020   ADDENDUM REPORT: 03/31/2020 10:40 ADDENDUM: Preliminary report texted to Dr.  via AMION Electronically Signed   By: Charles  Clark M.D.   On: 03/31/2020 10:40   Result Date: 03/31/2020 CLINICAL DATA:  Follow-up stroke. Right-sided paresthesia. Left thalamic infarct on MRI yesterday EXAM: CT ANGIOGRAPHY HEAD AND NECK TECHNIQUE: Multidetector CT imaging of the head and neck was performed using the standard protocol during bolus administration of intravenous contrast. Multiplanar CT image reconstructions and MIPs were obtained to evaluate the vascular anatomy. Carotid stenosis measurements (when applicable) are obtained utilizing NASCET criteria, using the distal internal carotid diameter as the denominator. CONTRAST:  75mL OMNIPAQUE IOHEXOL 350 MG/ML SOLN COMPARISON:  MRI head 03/30/2020.  CT head 03/31/2020 FINDINGS: CT HEAD FINDINGS Brain: Mild atrophy. Punctate acute infarct left thalamus seen on  MRI is not visualized on the current CT. No acute cortical infarct, hemorrhage, or mass lesion. Ventricle size mildly prominent due to atrophy. 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 Vascular: Negative for hyperdense vessel Skull: No focal skeletal lesion. Sinuses: Paranasal sinuses clear. Orbits: Bilateral cataract extraction. Review of the MIP images confirms the above findings CTA NECK FINDINGS Aortic arch: Atherosclerotic aorta. Three-vessel arch. 50% diameter stenosis proximal left common carotid artery. 50% diameter stenosis proximal left subclavian artery. Right carotid system: Atherosclerotic calcification right common carotid artery with mild stenosis. Atherosclerotic calcification right carotid bifurcation without significant stenosis. Left carotid system: 50% diameter stenosis proximal left common carotid artery. Scattered atherosclerotic calcification in the mid and distal left common carotid artery without significant stenosis. Mild atherosclerotic disease left carotid bifurcation without significant stenosis. Vertebral arteries: Right vertebral artery dominant. Moderate to severe stenosis proximal right vertebral artery due to atherosclerotic plaque. Remainder of the right vertebral artery is widely patent. Severe stenosis at the origin of the left vertebral artery. Left vertebral artery is a small vessel which is diffusely diseased proximally. Left vertebral artery is patent to the basilar. Skeleton: No acute skeletal abnormality.  Cervical spondylosis. Other neck: Multiple thyroid nodules. Largest nodule on the left measures 20 mm. No adenopathy in the neck. Upper chest: Lung apices clear bilaterally. Atherosclerotic aortic arch. Review of the MIP images confirms the above findings CTA HEAD FINDINGS Anterior circulation: Internal carotid artery widely patent through the cavernous segment. Anterior and middle cerebral arteries patent bilaterally without large vessel occlusion or  flow limiting stenosis. Posterior circulation: Both vertebral arteries are small but patent to the basilar. PICA patent bilaterally. Basilar is small and widely patent. Fetal origin of the posterior cerebral artery bilaterally. Superior cerebellar arteries patent bilaterally. Right posterior cerebral artery patent without stenosis Occlusion   left P3 segment.  Moderate stenosis left P2 segment. Venous sinuses: Normal venous enhancement Anatomic variants: None Review of the MIP images confirms the above findings IMPRESSION: 1. CT head negative for acute abnormality. Small acute infarct in the left thalamus seen on MRI last night is not visualized by CT. No intracranial hemorrhage 2. Atherosclerotic aortic arch and proximal great vessels. Atherosclerotic disease in the carotid bifurcation bilaterally without significant stenosis. 3. Moderate to severe stenosis proximal right vertebral artery which is then patent to the basilar. Severe stenosis proximal left vertebral artery which is diffusely diseased and small but does contribute to the basilar. 4. Occlusion left P3 segment.  Moderate stenosis left P2 segment 5. Anterior circulation widely patent. 6. Multiple thyroid nodules. The largest nodule on the left is 20 mm. Thyroid ultrasound recommended. (Ref: J Am Coll Radiol. 2015 Feb;12(2): 143-50). Electronically Signed: By: Charles  Clark M.D. On: 03/31/2020 10:24   CT Head Wo Contrast  Result Date: 03/31/2020 CLINICAL DATA:  Fall, reported head strike EXAM: CT HEAD WITHOUT CONTRAST CT CERVICAL SPINE WITHOUT CONTRAST TECHNIQUE: Multidetector CT imaging of the head and cervical spine was performed following the standard protocol without intravenous contrast. Multiplanar CT image reconstructions of the cervical spine were also generated. COMPARISON:  MRI 03/30/2020, CT 03/30/2020 some FINDINGS: CT HEAD FINDINGS Brain: Known focus of acute ischemia in the dorsal lateral left thalamus is poorly visualized on CT  examination, much better seen on the more sensitive diffusion-weighted imaging performed several hours prior. There is a remote left cerebellar lacunar infarct. No new visible areas of ischemia. No evidence of acute hemorrhage, hydrocephalus, extra-axial collection or mass lesion/mass effect. Symmetric prominence of the ventricles, cisterns and sulci compatible with parenchymal volume loss. Patchy areas of white matter hypoattenuation are most compatible with chronic microvascular angiopathy. Vascular: Atherosclerotic calcification of the carotid siphons. No hyperdense vessel. Skull: No calvarial fracture or suspicious osseous lesion. No scalp swelling or hematoma. Sinuses/Orbits: Paranasal sinuses and mastoid air cells are predominantly clear. Included orbital structures are unremarkable. Other: None CT CERVICAL SPINE FINDINGS Alignment: Stabilization collar is absent at the time of examination. Mild reversal of the normal cervical lordosis may in part be due to slight neck flexion seen on the scout imaging. No evidence of traumatic listhesis. No abnormally widened, perched or jumped facets. Normal alignment of the craniocervical and atlantoaxial articulations. Skull base and vertebrae: No acute fracture. No primary bone lesion or focal pathologic process. Soft tissues and spinal canal: No pre or paravertebral fluid or swelling. No visible canal hematoma. Airways patent. Disc levels: Multilevel intervertebral disc height loss with spondylitic endplate changes. Posterior disc osteophyte complexes at the C3-4, C4-5 and C5-6 level result in at most mild canal stenosis while more uncinate spurring and facet degenerative changes result in moderate narrowing on the left C6-7 and on the right at C4-5 and C5-6. No severe canal or foraminal narrowing. Upper chest: No acute abnormality in the upper chest or imaged lung apices. Extensive calcification of the proximal great vessels and cervical carotids. Other: Diffusely  enlarged and heterogeneous thyroid gland. IMPRESSION: 1. Known focus of acute ischemia in the dorsal lateral left thalamus is poorly visualized on CT examination, much better seen on the more sensitive diffusion-weighted imaging performed several hours prior. 2. No new visible areas of ischemia or acute intracranial abnormality. 3. No significant scalp swelling or hematoma. No calvarial fracture. 4. Stable parenchymal volume loss and chronic microvascular ischemic changes. 5. No acute fracture or malalignment of the cervical spine. 6.   Multilevel degenerative disc disease and facet degenerative changes of the cervical spine as described. 7. Diffusely enlarged and heterogeneous thyroid gland. Consider further evaluation with thyroid ultrasound. This follows consensus guidelines: Managing Incidental Thyroid Nodules Detected on Imaging: White Paper of the ACR Incidental Thyroid Findings Committee. J Am Coll Radiol 2015; 12:143-150. and [employer] 3-tiered system for managing ITNs: J Am Coll Radiol. 2015; Feb;12(2): 143-50 8. Cervical and intracranial atherosclerosis. Electronically Signed   By: Price  DeHay M.D.   On: 03/31/2020 03:49   CT HEAD WO CONTRAST  Result Date: 03/30/2020 CLINICAL DATA:  Right-sided facial, upper extremity, and lower extremity numbness EXAM: CT HEAD WITHOUT CONTRAST TECHNIQUE: Contiguous axial images were obtained from the base of the skull through the vertex without intravenous contrast. COMPARISON:  None. FINDINGS: Brain: There is mild age related volume loss. There is no intracranial mass, hemorrhage, extra-axial fluid collection, or midline shift. Decreased attenuation is noted in the mid right cerebellum slightly lateral and inferior to the dentate nucleus. This area is concerning for an acute infarct in the mid right cerebellum. There is evidence of a prior small lacunar infarct in the superior left cerebellum. Elsewhere there is minimal small vessel disease in the centra semiovale  bilaterally. Vascular: No hyperdense vessel. No appreciable vascular calcification noted. Skull: The bony calvarium appears intact. Sinuses/Orbits: There is slight mucosal thickening in several ethmoid air cells. Other visualized paranasal sinuses are clear. Orbits appear symmetric bilaterally. Other: Mastoid air cells are clear. IMPRESSION: Decreased attenuation in the right cerebellum slightly lateral and inferior to the dentate nucleus, suspicious for acute infarct focally in this area. 2.  Prior small lacunar infarct in the upper left cerebellum. 3. Slight periventricular small vessel disease. No acute appearing supratentorial infarct evident on this study. No mass or hemorrhage. 4.  Mild mucosal thickening in several ethmoid air cells. Electronically Signed   By: William  Woodruff III M.D.   On: 03/30/2020 13:29   CT ANGIO NECK W OR WO CONTRAST  Addendum Date: 03/31/2020   ADDENDUM REPORT: 03/31/2020 10:40 ADDENDUM: Preliminary report texted to Dr.  via AMION Electronically Signed   By: Charles  Clark M.D.   On: 03/31/2020 10:40   Result Date: 03/31/2020 CLINICAL DATA:  Follow-up stroke. Right-sided paresthesia. Left thalamic infarct on MRI yesterday EXAM: CT ANGIOGRAPHY HEAD AND NECK TECHNIQUE: Multidetector CT imaging of the head and neck was performed using the standard protocol during bolus administration of intravenous contrast. Multiplanar CT image reconstructions and MIPs were obtained to evaluate the vascular anatomy. Carotid stenosis measurements (when applicable) are obtained utilizing NASCET criteria, using the distal internal carotid diameter as the denominator. CONTRAST:  75mL OMNIPAQUE IOHEXOL 350 MG/ML SOLN COMPARISON:  MRI head 03/30/2020.  CT head 03/31/2020 FINDINGS: CT HEAD FINDINGS Brain: Mild atrophy. Punctate acute infarct left thalamus seen on MRI is not visualized on the current CT. No acute cortical infarct, hemorrhage, or mass lesion. Ventricle size mildly prominent due  to atrophy. 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 Vascular: Negative for hyperdense vessel Skull: No focal skeletal lesion. Sinuses: Paranasal sinuses clear. Orbits: Bilateral cataract extraction. Review of the MIP images confirms the above findings CTA NECK FINDINGS Aortic arch: Atherosclerotic aorta. Three-vessel arch. 50% diameter stenosis proximal left common carotid artery. 50% diameter stenosis proximal left subclavian artery. Right carotid system: Atherosclerotic calcification right common carotid artery with mild stenosis. Atherosclerotic calcification right carotid bifurcation without significant stenosis. Left carotid system: 50% diameter stenosis proximal left common carotid artery. Scattered atherosclerotic calcification in the   mid and distal left common carotid artery without significant stenosis. Mild atherosclerotic disease left carotid bifurcation without significant stenosis. Vertebral arteries: Right vertebral artery dominant. Moderate to severe stenosis proximal right vertebral artery due to atherosclerotic plaque. Remainder of the right vertebral artery is widely patent. Severe stenosis at the origin of the left vertebral artery. Left vertebral artery is a small vessel which is diffusely diseased proximally. Left vertebral artery is patent to the basilar. Skeleton: No acute skeletal abnormality.  Cervical spondylosis. Other neck: Multiple thyroid nodules. Largest nodule on the left measures 20 mm. No adenopathy in the neck. Upper chest: Lung apices clear bilaterally. Atherosclerotic aortic arch. Review of the MIP images confirms the above findings CTA HEAD FINDINGS Anterior circulation: Internal carotid artery widely patent through the cavernous segment. Anterior and middle cerebral arteries patent bilaterally without large vessel occlusion or flow limiting stenosis. Posterior circulation: Both vertebral arteries are small but patent to the basilar. PICA patent bilaterally.  Basilar is small and widely patent. Fetal origin of the posterior cerebral artery bilaterally. Superior cerebellar arteries patent bilaterally. Right posterior cerebral artery patent without stenosis Occlusion left P3 segment.  Moderate stenosis left P2 segment. Venous sinuses: Normal venous enhancement Anatomic variants: None Review of the MIP images confirms the above findings IMPRESSION: 1. CT head negative for acute abnormality. Small acute infarct in the left thalamus seen on MRI last night is not visualized by CT. No intracranial hemorrhage 2. Atherosclerotic aortic arch and proximal great vessels. Atherosclerotic disease in the carotid bifurcation bilaterally without significant stenosis. 3. Moderate to severe stenosis proximal right vertebral artery which is then patent to the basilar. Severe stenosis proximal left vertebral artery which is diffusely diseased and small but does contribute to the basilar. 4. Occlusion left P3 segment.  Moderate stenosis left P2 segment 5. Anterior circulation widely patent. 6. Multiple thyroid nodules. The largest nodule on the left is 20 mm. Thyroid ultrasound recommended. (Ref: J Am Coll Radiol. 2015 Feb;12(2): 143-50). Electronically Signed: By: Franchot Gallo M.D. On: 03/31/2020 10:24   CT Cervical Spine Wo Contrast  Result Date: 03/31/2020 CLINICAL DATA:  Fall, reported head strike EXAM: CT HEAD WITHOUT CONTRAST CT CERVICAL SPINE WITHOUT CONTRAST TECHNIQUE: Multidetector CT imaging of the head and cervical spine was performed following the standard protocol without intravenous contrast. Multiplanar CT image reconstructions of the cervical spine were also generated. COMPARISON:  MRI 03/30/2020, CT 03/30/2020 some FINDINGS: CT HEAD FINDINGS Brain: Known focus of acute ischemia in the dorsal lateral left thalamus is poorly visualized on CT examination, much better seen on the more sensitive diffusion-weighted imaging performed several hours prior. There is a remote left  cerebellar lacunar infarct. No new visible areas of ischemia. No evidence of acute hemorrhage, hydrocephalus, extra-axial collection or mass lesion/mass effect. Symmetric prominence of the ventricles, cisterns and sulci compatible with parenchymal volume loss. Patchy areas of white matter hypoattenuation are most compatible with chronic microvascular angiopathy. Vascular: Atherosclerotic calcification of the carotid siphons. No hyperdense vessel. Skull: No calvarial fracture or suspicious osseous lesion. No scalp swelling or hematoma. Sinuses/Orbits: Paranasal sinuses and mastoid air cells are predominantly clear. Included orbital structures are unremarkable. Other: None CT CERVICAL SPINE FINDINGS Alignment: Stabilization collar is absent at the time of examination. Mild reversal of the normal cervical lordosis may in part be due to slight neck flexion seen on the scout imaging. No evidence of traumatic listhesis. No abnormally widened, perched or jumped facets. Normal alignment of the craniocervical and atlantoaxial articulations. Skull base and vertebrae:  No acute fracture. No primary bone lesion or focal pathologic process. Soft tissues and spinal canal: No pre or paravertebral fluid or swelling. No visible canal hematoma. Airways patent. Disc levels: Multilevel intervertebral disc height loss with spondylitic endplate changes. Posterior disc osteophyte complexes at the C3-4, C4-5 and C5-6 level result in at most mild canal stenosis while more uncinate spurring and facet degenerative changes result in moderate narrowing on the left C6-7 and on the right at C4-5 and C5-6. No severe canal or foraminal narrowing. Upper chest: No acute abnormality in the upper chest or imaged lung apices. Extensive calcification of the proximal great vessels and cervical carotids. Other: Diffusely enlarged and heterogeneous thyroid gland. IMPRESSION: 1. Known focus of acute ischemia in the dorsal lateral left thalamus is poorly  visualized on CT examination, much better seen on the more sensitive diffusion-weighted imaging performed several hours prior. 2. No new visible areas of ischemia or acute intracranial abnormality. 3. No significant scalp swelling or hematoma. No calvarial fracture. 4. Stable parenchymal volume loss and chronic microvascular ischemic changes. 5. No acute fracture or malalignment of the cervical spine. 6. Multilevel degenerative disc disease and facet degenerative changes of the cervical spine as described. 7. Diffusely enlarged and heterogeneous thyroid gland. Consider further evaluation with thyroid ultrasound. This follows consensus guidelines: Managing Incidental Thyroid Nodules Detected on Imaging: White Paper of the ACR Incidental Thyroid Findings Committee. J Am Coll Radiol 2015; 12:143-150. and [employer] 3-tiered system for managing ITNs: J Am Coll Radiol. 2015; Feb;12(2): 143-50 8. Cervical and intracranial atherosclerosis. Electronically Signed   By: Price  DeHay M.D.   On: 03/31/2020 03:49   MR BRAIN WO CONTRAST  Result Date: 03/30/2020 CLINICAL DATA:  Right-sided numbness EXAM: MRI HEAD WITHOUT CONTRAST TECHNIQUE: Multiplanar, multiecho pulse sequences of the brain and surrounding structures were obtained without intravenous contrast. COMPARISON:  Head CT 03/30/2020 Brain MRI 04/18/2017 FINDINGS: Brain: Punctate focus of abnormal diffusion restriction within the dorsolateral left thalamus. Small, old left cerebellar infarct. Multifocal white matter hyperintensity, most commonly due to chronic ischemic microangiopathy. Normal volume of CSF spaces. No chronic microhemorrhage. Normal midline structures. Vascular: Normal flow voids. Skull and upper cervical spine: Normal marrow signal. Sinuses/Orbits: Negative. Other: None. IMPRESSION: 1. Punctate focus of acute ischemia within the dorsolateral left thalamus. No hemorrhage or mass effect. 2. Small, old left cerebellar infarct and findings of chronic ischemic  microangiopathy. Electronically Signed   By: Kevin  Herman M.D.   On: 03/30/2020 22:11   US Carotid Bilateral (at [facility] and [facility] only)  Result Date: 03/31/2020 CLINICAL DATA:  CVA. Right-sided numbness. History of CAD, hyperlipidemia and diabetes. Former smoker. EXAM: BILATERAL CAROTID DUPLEX ULTRASOUND TECHNIQUE: Gray scale imaging, color Doppler and duplex ultrasound were performed of bilateral carotid and vertebral arteries in the neck. COMPARISON:  None. FINDINGS: Criteria: Quantification of carotid stenosis is based on velocity parameters that correlate the residual internal carotid diameter with NASCET-based stenosis levels, using the diameter of the distal internal carotid lumen as the denominator for stenosis measurement. The following velocity measurements were obtained: RIGHT ICA: 131/30 cm/sec CCA: 90/21 cm/sec SYSTOLIC ICA/CCA RATIO:  1.5 ECA: 104 cm/sec LEFT ICA: 80/14 cm/sec CCA: 96/16 cm/sec SYSTOLIC ICA/CCA RATIO:  0.8 ECA: 132 cm/sec RIGHT CAROTID ARTERY: There is a moderate amount plaque involving the mid aspect of the right common carotid artery of echogenic (images 7 and 9. There is a minimal amount of eccentric echogenic plaque involving the origin of the right internal carotid artery (image 26, which results in   borderline elevated peak systolic velocity with the proximal aspect of the right internal carotid artery. Greatest acquired peak systolic velocity with the proximal right ICA measures 131 centimeters/second (image 28). RIGHT VERTEBRAL ARTERY:  Antegrade Flow LEFT CAROTID ARTERY: There is a moderate amount of eccentric echogenic plaque involving the proximal aspect of the left common carotid artery (image 37). There is a minimal amount of eccentric echogenic plaque involving the origin and proximal aspect the left internal carotid artery (image 53), not resulting in elevated peak systolic velocities within the interrogated course the left internal carotid artery to suggest a  hemodynamically significant stenosis. Borderline elevated peak systolic velocity within distal aspect the left internal carotid artery is felt to be factitious slightly elevated due to sampling at a location of turbulent flow (image 63). LEFT VERTEBRAL ARTERY:  Antegrade flow IMPRESSION: 1. Moderate amount of right-sided atherosclerotic plaque results in borderline elevated peak systolic velocities within the right internal carotid artery compatible with the lower end of the 50-69% luminal narrowing range. Further evaluation with CTA could performed as clinically indicated. 2. Moderate amount of left-sided atherosclerotic plaque, not definitely resulting in a hemodynamically significant stenosis. Electronically Signed   By: Sandi Mariscal M.D.   On: 03/31/2020 08:36    Assessment: 69 y.o. female with medical history significant of HTN,CAD/ MI,DM II,Dyslipidemia admitted for right sided numbness.  MRI of the brain personally reviewed and show an acute left thalamic infarct.  Etiology likely small vessel disease.  Patient on ASA and statin prior to presentation.  CTA of the head and neck shows moderate to severe left and right vertebral stenosis.  Left P3 occluded with moderate stenosis of the left P2.  Echocardiogram pending.  A1c 6.5, LDL 77.  Bp controlled.  Stroke Risk Factors - diabetes mellitus, hyperlipidemia and hypertension  Plan: 1. PT consult, OT consult, Speech consult 2. Echocardiogram pending 3. Prophylactic therapy-Dual antiplatelet therapy with ASA 21m and Plavix 75mfor three weeks with change to Plavix 7579maily alone as monotherapy after that time. 4. Aggressive lipid management with target LDL<70. 5. Telemetry monitoring 6. Frequent neuro checks 7. Patient to follow up with neurology on an outpatient basis  LesAlexis GoodellD Neurology [id number]/2021, 11:09 AM

## 2020-03-31 NOTE — Progress Notes (Signed)
*  PRELIMINARY RESULTS* Echocardiogram 2D Echocardiogram has been performed.  Sherrie Sport 03/31/2020, 4:03 PM

## 2020-03-31 NOTE — ED Notes (Signed)
Pt assisted to bathroom by this RN w steady gait. Pt began taking off underwear in bathroom and began hopping. This RN attempted to catch pt under arms. Pt grabbed this RN and fell flat on back, bumping head on floor. Pt stood back up with minimal assistance. Pt denies pain, skin clear w no lacerations or bruising noted. Pt denies pain. MD Mansy made aware, see new orders.

## 2020-03-31 NOTE — ED Notes (Addendum)
Pt states she will take evening meds when she gets home; refusing here. States she will check BG and eat when she gets home. Husband at bedside ride home. Pt wheeled out to vehicle. About 600cc of urine noted in purewick canister.

## 2020-03-31 NOTE — Discharge Summary (Signed)
Physician Discharge Summary  Melanie Ray RCB:[id number] DOB: 01-23-1951 DOA: 03/30/2020  PCP: Leone Haven, MD  Admit date: 03/30/2020 Discharge date: 03/31/2020  Admitted From: Home Disposition:  Home  Recommendations for Outpatient Follow-up:  1. Follow up with PCP in 1-2 weeks 2. Follow up with neurology 3. Please obtain BMP/CBC in one week 4. Please follow up on the following pending results: Echocardiogram  Home Health: No Equipment/Devices: None Discharge Condition: Stable CODE STATUS:  Diet recommendation: Heart Healthy / Carb Modified   Brief/Interim Summary: Melanie Ray is a 69 y.o. female with medical history significant of HTN,CAD/ MI,DM II,Dyslipidemia seen in ed for stroke.  Patient reports that Friday she was told she had a cavity while at her routine dental cleaning appointment.  On Saturday patient started to have tingling affecting the right side of her face the right side of her arm and upper extremity extending to the fingers the right side of her breast chest wall and abdomen.  She thought that since the cavity was so close to the nerve it is probably the cavity that is causing the tingling and she called the dentist.  The dentist office told the patient that it sounds like she is having a stroke and that she should be evaluated, at that time the patient went to the urgent care and was told the same thing and EMS was called and asked patient to bring to the emergency room for evaluation of acute CVA.   MRI of the brain personally reviewed and show an acute left thalamic infarct.  Etiology likely small vessel disease.  Patient on ASA and statin prior to presentation.  CTA of the head and neck shows moderate to severe left and right vertebral stenosis.  Left P3 occluded with moderate stenosis of the left P2.  Echocardiogram pending.  A1c 6.5, LDL 77.  Bp controlled.  Stroke Risk Factors - diabetes mellitus, hyperlipidemia and hypertension.  Completed the  stroke work-up and was advised to take aspirin and Plavix together along with Lipitor for 3 weeks and then continue with Plavix and Lipitor.  She needs to follow-up with neurology as an outpatient.   She will continue with rest of her home meds.  Discharge Diagnoses:  Active Problems:   Essential hypertension   HLD (hyperlipidemia)   Glaucoma   Depression   Anxiety   DM type 2 (diabetes mellitus, type 2) (HCC)   History of acute anterior wall MI   Chronic systolic CHF (congestive heart failure) (HCC)   CAD (coronary artery disease)   Cerebellar stroke, acute [facility])  Discharge Instructions  Discharge Instructions    Ambulatory referral to Physical Therapy   Complete by: As directed    Iontophoresis - 4 mg/ml of dexamethasone: No   T.E.N.S. Unit Evaluation and Dispense as Indicated: No   Diet - low sodium heart healthy   Complete by: As directed    Discharge instructions   Complete by: As directed    It was pleasure taking care of you. Please take aspirin and Plavix together for 3 weeks and then stop taking aspirin and continue with Plavix. Continue with rest of your medications. Follow-up with your primary care physician and neurologist.   Increase activity slowly   Complete by: As directed      Allergies as of 03/31/2020      Reactions   Tetracycline Swelling   Meperidine Nausea And Vomiting   Nausea      Medication List    TAKE these medications  aspirin 81 MG EC tablet Take 1 tablet (81 mg total) by mouth daily.   atorvastatin 80 MG tablet Commonly known as: LIPITOR Take 1 tablet by mouth once daily   blood glucose meter kit and supplies Kit Dispense based on patient and insurance preference.  Check glucose once daily fasting in the morning. (FOR ICD 10 E11.9).   carvedilol 12.5 MG tablet Commonly known as: COREG TAKE 1 TABLET BY MOUTH TWICE DAILY WITH MEALS   clopidogrel 75 MG tablet Commonly known as: PLAVIX Take 1 tablet (75 mg total) by mouth  daily. Start taking on: Apr 01, 2020   dorzolamide-timolol 22.3-6.8 MG/ML ophthalmic solution Commonly known as: COSOPT Place 1 drop into both eyes 2 (two) times daily.   furosemide 40 MG tablet Commonly known as: LASIX Take 1 tablet (40 mg total) by mouth as directed. 40 mg on Mon, Wed and Fri's; all other days 20 mg   isosorbide mononitrate 30 MG 24 hr tablet Commonly known as: IMDUR Take 1 tablet (30 mg total) by mouth daily.   latanoprost 0.005 % ophthalmic solution Commonly known as: XALATAN Place 1 drop into both eyes at bedtime.   Latuda 60 MG Tabs Generic drug: Lurasidone HCl Take 60 mg by mouth daily with breakfast.   LORazepam 1 MG tablet Commonly known as: ATIVAN Take 1 mg by mouth at bedtime as needed for sleep.   metFORMIN 500 MG tablet Commonly known as: GLUCOPHAGE TAKE 1 TABLET BY MOUTH TWICE DAILY WITH  A  MEAL What changed: See the new instructions.   Multi-Vitamin Daily Tabs Take 1 tablet by mouth daily.   nitroGLYCERIN 0.4 MG SL tablet Commonly known as: NITROSTAT Place 1 tablet (0.4 mg total) under the tongue every 5 (five) minutes x 3 doses as needed for chest pain (Do not exceed 3 doses).   OLANZapine 5 MG tablet Commonly known as: ZyPREXA Take 1 tablet (5 mg total) by mouth at bedtime. For anxiety and sleep   olmesartan 20 MG tablet Commonly known as: BENICAR Take 20 mg by mouth daily.   ondansetron 4 MG tablet Commonly known as: ZOFRAN TAKE 1 TABLET BY MOUTH ONCE DAILY AS NEEDED FOR NAUSEA AND VOMITING   spironolactone 25 MG tablet Commonly known as: ALDACTONE Take 1/2 (one-half) tablet by mouth once daily What changed: See the new instructions.   traZODone 100 MG tablet Commonly known as: DESYREL Take 100 mg by mouth at bedtime as needed for sleep.   venlafaxine XR 150 MG 24 hr capsule Commonly known as: EFFEXOR-XR TAKE 2 CAPSULES BY MOUTH ONCE DAILY (NEEDS  APPOINTMENT  FOR  FURTHER  REFILLS) What changed: See the new  instructions.   VITAMIN B-12 PO Take 1 capsule by mouth daily.      Follow-up Information    Leone Haven, MD. Schedule an appointment as soon as possible for a visit.   Specialty: Family Medicine Contact information: [address] [place] [address] [phone]        Sherren Mocha, MD .   Specialty: Cardiology Contact information: [phone] [place] [address] [phone]          Allergies  Allergen Reactions  . Tetracycline Swelling  . Meperidine Nausea And Vomiting    Nausea    Consultations:  Neurology.  Procedures/Studies: CT ANGIO HEAD W OR WO CONTRAST  Addendum Date: 03/31/2020   ADDENDUM REPORT: 03/31/2020 12:24 ADDENDUM: Dr. Reesa Chew has just returned my page and I informed her  of the CTA results. Electronically Signed   By: Franchot Gallo M.D.   On: 03/31/2020 12:24   Addendum Date: 03/31/2020   ADDENDUM REPORT: 03/31/2020 10:40 ADDENDUM: Preliminary report texted to Dr. Doy Mince via Shea Evans Electronically Signed   By: Franchot Gallo M.D.   On: 03/31/2020 10:40   Result Date: 03/31/2020 CLINICAL DATA:  Follow-up stroke. Right-sided paresthesia. Left thalamic infarct on MRI yesterday EXAM: CT ANGIOGRAPHY HEAD AND NECK TECHNIQUE: Multidetector CT imaging of the head and neck was performed using the standard protocol during bolus administration of intravenous contrast. Multiplanar CT image reconstructions and MIPs were obtained to evaluate the vascular anatomy. Carotid stenosis measurements (when applicable) are obtained utilizing NASCET criteria, using the distal internal carotid diameter as the denominator. CONTRAST:  45m OMNIPAQUE IOHEXOL 350 MG/ML SOLN COMPARISON:  MRI head 03/30/2020.  CT head 03/31/2020 FINDINGS: CT HEAD FINDINGS Brain: Mild atrophy. Punctate acute infarct left thalamus seen on MRI is not visualized on the current CT. No acute cortical infarct, hemorrhage, or mass lesion. Ventricle size mildly  prominent due to atrophy. 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 Vascular: Negative for hyperdense vessel Skull: No focal skeletal lesion. Sinuses: Paranasal sinuses clear. Orbits: Bilateral cataract extraction. Review of the MIP images confirms the above findings CTA NECK FINDINGS Aortic arch: Atherosclerotic aorta. Three-vessel arch. 50% diameter stenosis proximal left common carotid artery. 50% diameter stenosis proximal left subclavian artery. Right carotid system: Atherosclerotic calcification right common carotid artery with mild stenosis. Atherosclerotic calcification right carotid bifurcation without significant stenosis. Left carotid system: 50% diameter stenosis proximal left common carotid artery. Scattered atherosclerotic calcification in the mid and distal left common carotid artery without significant stenosis. Mild atherosclerotic disease left carotid bifurcation without significant stenosis. Vertebral arteries: Right vertebral artery dominant. Moderate to severe stenosis proximal right vertebral artery due to atherosclerotic plaque. Remainder of the right vertebral artery is widely patent. Severe stenosis at the origin of the left vertebral artery. Left vertebral artery is a small vessel which is diffusely diseased proximally. Left vertebral artery is patent to the basilar. Skeleton: No acute skeletal abnormality.  Cervical spondylosis. Other neck: Multiple thyroid nodules. Largest nodule on the left measures 20 mm. No adenopathy in the neck. Upper chest: Lung apices clear bilaterally. Atherosclerotic aortic arch. Review of the MIP images confirms the above findings CTA HEAD FINDINGS Anterior circulation: Internal carotid artery widely patent through the cavernous segment. Anterior and middle cerebral arteries patent bilaterally without large vessel occlusion or flow limiting stenosis. Posterior circulation: Both vertebral arteries are small but patent to the basilar. PICA patent  bilaterally. Basilar is small and widely patent. Fetal origin of the posterior cerebral artery bilaterally. Superior cerebellar arteries patent bilaterally. Right posterior cerebral artery patent without stenosis Occlusion left P3 segment.  Moderate stenosis left P2 segment. Venous sinuses: Normal venous enhancement Anatomic variants: None Review of the MIP images confirms the above findings IMPRESSION: 1. CT head negative for acute abnormality. Small acute infarct in the left thalamus seen on MRI last night is not visualized by CT. No intracranial hemorrhage 2. Atherosclerotic aortic arch and proximal great vessels. Atherosclerotic disease in the carotid bifurcation bilaterally without significant stenosis. 3. Moderate to severe stenosis proximal right vertebral artery which is then patent to the basilar. Severe stenosis proximal left vertebral artery which is diffusely diseased and small but does contribute to the basilar. 4. Occlusion left P3 segment.  Moderate stenosis left P2 segment 5. Anterior circulation widely patent. 6. Multiple thyroid nodules. The largest nodule  on the left is 20 mm. Thyroid ultrasound recommended. (Ref: J Am Coll Radiol. 2015 Feb;12(2): 143-50). Electronically Signed: By: Franchot Gallo M.D. On: 03/31/2020 10:24   CT Head Wo Contrast  Result Date: 03/31/2020 CLINICAL DATA:  Fall, reported head strike EXAM: CT HEAD WITHOUT CONTRAST CT CERVICAL SPINE WITHOUT CONTRAST TECHNIQUE: Multidetector CT imaging of the head and cervical spine was performed following the standard protocol without intravenous contrast. Multiplanar CT image reconstructions of the cervical spine were also generated. COMPARISON:  MRI 03/30/2020, CT 03/30/2020 some FINDINGS: CT HEAD FINDINGS Brain: Known focus of acute ischemia in the dorsal lateral left thalamus is poorly visualized on CT examination, much better seen on the more sensitive diffusion-weighted imaging performed several hours prior. There is a remote  left cerebellar lacunar infarct. No new visible areas of ischemia. No evidence of acute hemorrhage, hydrocephalus, extra-axial collection or mass lesion/mass effect. Symmetric prominence of the ventricles, cisterns and sulci compatible with parenchymal volume loss. Patchy areas of white matter hypoattenuation are most compatible with chronic microvascular angiopathy. Vascular: Atherosclerotic calcification of the carotid siphons. No hyperdense vessel. Skull: No calvarial fracture or suspicious osseous lesion. No scalp swelling or hematoma. Sinuses/Orbits: Paranasal sinuses and mastoid air cells are predominantly clear. Included orbital structures are unremarkable. Other: None CT CERVICAL SPINE FINDINGS Alignment: Stabilization collar is absent at the time of examination. Mild reversal of the normal cervical lordosis may in part be due to slight neck flexion seen on the scout imaging. No evidence of traumatic listhesis. No abnormally widened, perched or jumped facets. Normal alignment of the craniocervical and atlantoaxial articulations. Skull base and vertebrae: No acute fracture. No primary bone lesion or focal pathologic process. Soft tissues and spinal canal: No pre or paravertebral fluid or swelling. No visible canal hematoma. Airways patent. Disc levels: Multilevel intervertebral disc height loss with spondylitic endplate changes. Posterior disc osteophyte complexes at the C3-4, C4-5 and C5-6 level result in at most mild canal stenosis while more uncinate spurring and facet degenerative changes result in moderate narrowing on the left C6-7 and on the right at C4-5 and C5-6. No severe canal or foraminal narrowing. Upper chest: No acute abnormality in the upper chest or imaged lung apices. Extensive calcification of the proximal great vessels and cervical carotids. Other: Diffusely enlarged and heterogeneous thyroid gland. IMPRESSION: 1. Known focus of acute ischemia in the dorsal lateral left thalamus is poorly  visualized on CT examination, much better seen on the more sensitive diffusion-weighted imaging performed several hours prior. 2. No new visible areas of ischemia or acute intracranial abnormality. 3. No significant scalp swelling or hematoma. No calvarial fracture. 4. Stable parenchymal volume loss and chronic microvascular ischemic changes. 5. No acute fracture or malalignment of the cervical spine. 6. Multilevel degenerative disc disease and facet degenerative changes of the cervical spine as described. 7. Diffusely enlarged and heterogeneous thyroid gland. Consider further evaluation with thyroid ultrasound. This follows consensus guidelines: Managing Incidental Thyroid Nodules Detected on Imaging: White Paper of the ACR Incidental Thyroid Findings Committee. J Am Coll Radiol 2015; 12:143-150. and [facility] 3-tiered system for managing ITNs: J Am Coll Radiol. 2015; Feb;12(2): 143-50 8. Cervical and intracranial atherosclerosis. Electronically Signed   By: Lovena Le M.D.   On: 03/31/2020 03:49   CT HEAD WO CONTRAST  Result Date: 03/30/2020 CLINICAL DATA:  Right-sided facial, upper extremity, and lower extremity numbness EXAM: CT HEAD WITHOUT CONTRAST TECHNIQUE: Contiguous axial images were obtained from the base of the skull through the vertex without intravenous  contrast. COMPARISON:  None. FINDINGS: Brain: There is mild age related volume loss. There is no intracranial mass, hemorrhage, extra-axial fluid collection, or midline shift. Decreased attenuation is noted in the mid right cerebellum slightly lateral and inferior to the dentate nucleus. This area is concerning for an acute infarct in the mid right cerebellum. There is evidence of a prior small lacunar infarct in the superior left cerebellum. Elsewhere there is minimal small vessel disease in the centra semiovale bilaterally. Vascular: No hyperdense vessel. No appreciable vascular calcification noted. Skull: The bony calvarium appears intact.  Sinuses/Orbits: There is slight mucosal thickening in several ethmoid air cells. Other visualized paranasal sinuses are clear. Orbits appear symmetric bilaterally. Other: Mastoid air cells are clear. IMPRESSION: Decreased attenuation in the right cerebellum slightly lateral and inferior to the dentate nucleus, suspicious for acute infarct focally in this area. 2.  Prior small lacunar infarct in the upper left cerebellum. 3. Slight periventricular small vessel disease. No acute appearing supratentorial infarct evident on this study. No mass or hemorrhage. 4.  Mild mucosal thickening in several ethmoid air cells. Electronically Signed   By: Lowella Grip III M.D.   On: 03/30/2020 13:29   CT ANGIO NECK W OR WO CONTRAST  Addendum Date: 03/31/2020   ADDENDUM REPORT: 03/31/2020 12:24 ADDENDUM: Dr. Reesa Chew has just returned my page and I informed her of the CTA results. Electronically Signed   By: Franchot Gallo M.D.   On: 03/31/2020 12:24   Addendum Date: 03/31/2020   ADDENDUM REPORT: 03/31/2020 10:40 ADDENDUM: Preliminary report texted to Dr. Doy Mince via Shea Evans Electronically Signed   By: Franchot Gallo M.D.   On: 03/31/2020 10:40   Result Date: 03/31/2020 CLINICAL DATA:  Follow-up stroke. Right-sided paresthesia. Left thalamic infarct on MRI yesterday EXAM: CT ANGIOGRAPHY HEAD AND NECK TECHNIQUE: Multidetector CT imaging of the head and neck was performed using the standard protocol during bolus administration of intravenous contrast. Multiplanar CT image reconstructions and MIPs were obtained to evaluate the vascular anatomy. Carotid stenosis measurements (when applicable) are obtained utilizing NASCET criteria, using the distal internal carotid diameter as the denominator. CONTRAST:  24m OMNIPAQUE IOHEXOL 350 MG/ML SOLN COMPARISON:  MRI head 03/30/2020.  CT head 03/31/2020 FINDINGS: CT HEAD FINDINGS Brain: Mild atrophy. Punctate acute infarct left thalamus seen on MRI is not visualized on the current CT. No  acute cortical infarct, hemorrhage, or mass lesion. Ventricle size mildly prominent due to atrophy. 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 Vascular: Negative for hyperdense vessel Skull: No focal skeletal lesion. Sinuses: Paranasal sinuses clear. Orbits: Bilateral cataract extraction. Review of the MIP images confirms the above findings CTA NECK FINDINGS Aortic arch: Atherosclerotic aorta. Three-vessel arch. 50% diameter stenosis proximal left common carotid artery. 50% diameter stenosis proximal left subclavian artery. Right carotid system: Atherosclerotic calcification right common carotid artery with mild stenosis. Atherosclerotic calcification right carotid bifurcation without significant stenosis. Left carotid system: 50% diameter stenosis proximal left common carotid artery. Scattered atherosclerotic calcification in the mid and distal left common carotid artery without significant stenosis. Mild atherosclerotic disease left carotid bifurcation without significant stenosis. Vertebral arteries: Right vertebral artery dominant. Moderate to severe stenosis proximal right vertebral artery due to atherosclerotic plaque. Remainder of the right vertebral artery is widely patent. Severe stenosis at the origin of the left vertebral artery. Left vertebral artery is a small vessel which is diffusely diseased proximally. Left vertebral artery is patent to the basilar. Skeleton: No acute skeletal abnormality.  Cervical spondylosis. Other neck: Multiple thyroid  nodules. Largest nodule on the left measures 20 mm. No adenopathy in the neck. Upper chest: Lung apices clear bilaterally. Atherosclerotic aortic arch. Review of the MIP images confirms the above findings CTA HEAD FINDINGS Anterior circulation: Internal carotid artery widely patent through the cavernous segment. Anterior and middle cerebral arteries patent bilaterally without large vessel occlusion or flow limiting stenosis. Posterior circulation:  Both vertebral arteries are small but patent to the basilar. PICA patent bilaterally. Basilar is small and widely patent. Fetal origin of the posterior cerebral artery bilaterally. Superior cerebellar arteries patent bilaterally. Right posterior cerebral artery patent without stenosis Occlusion left P3 segment.  Moderate stenosis left P2 segment. Venous sinuses: Normal venous enhancement Anatomic variants: None Review of the MIP images confirms the above findings IMPRESSION: 1. CT head negative for acute abnormality. Small acute infarct in the left thalamus seen on MRI last night is not visualized by CT. No intracranial hemorrhage 2. Atherosclerotic aortic arch and proximal great vessels. Atherosclerotic disease in the carotid bifurcation bilaterally without significant stenosis. 3. Moderate to severe stenosis proximal right vertebral artery which is then patent to the basilar. Severe stenosis proximal left vertebral artery which is diffusely diseased and small but does contribute to the basilar. 4. Occlusion left P3 segment.  Moderate stenosis left P2 segment 5. Anterior circulation widely patent. 6. Multiple thyroid nodules. The largest nodule on the left is 20 mm. Thyroid ultrasound recommended. (Ref: J Am Coll Radiol. 2015 Feb;12(2): 143-50). Electronically Signed: By: Franchot Gallo M.D. On: 03/31/2020 10:24   CT Cervical Spine Wo Contrast  Result Date: 03/31/2020 CLINICAL DATA:  Fall, reported head strike EXAM: CT HEAD WITHOUT CONTRAST CT CERVICAL SPINE WITHOUT CONTRAST TECHNIQUE: Multidetector CT imaging of the head and cervical spine was performed following the standard protocol without intravenous contrast. Multiplanar CT image reconstructions of the cervical spine were also generated. COMPARISON:  MRI 03/30/2020, CT 03/30/2020 some FINDINGS: CT HEAD FINDINGS Brain: Known focus of acute ischemia in the dorsal lateral left thalamus is poorly visualized on CT examination, much better seen on the more  sensitive diffusion-weighted imaging performed several hours prior. There is a remote left cerebellar lacunar infarct. No new visible areas of ischemia. No evidence of acute hemorrhage, hydrocephalus, extra-axial collection or mass lesion/mass effect. Symmetric prominence of the ventricles, cisterns and sulci compatible with parenchymal volume loss. Patchy areas of white matter hypoattenuation are most compatible with chronic microvascular angiopathy. Vascular: Atherosclerotic calcification of the carotid siphons. No hyperdense vessel. Skull: No calvarial fracture or suspicious osseous lesion. No scalp swelling or hematoma. Sinuses/Orbits: Paranasal sinuses and mastoid air cells are predominantly clear. Included orbital structures are unremarkable. Other: None CT CERVICAL SPINE FINDINGS Alignment: Stabilization collar is absent at the time of examination. Mild reversal of the normal cervical lordosis may in part be due to slight neck flexion seen on the scout imaging. No evidence of traumatic listhesis. No abnormally widened, perched or jumped facets. Normal alignment of the craniocervical and atlantoaxial articulations. Skull base and vertebrae: No acute fracture. No primary bone lesion or focal pathologic process. Soft tissues and spinal canal: No pre or paravertebral fluid or swelling. No visible canal hematoma. Airways patent. Disc levels: Multilevel intervertebral disc height loss with spondylitic endplate changes. Posterior disc osteophyte complexes at the C3-4, C4-5 and C5-6 level result in at most mild canal stenosis while more uncinate spurring and facet degenerative changes result in moderate narrowing on the left C6-7 and on the right at C4-5 and C5-6. No severe canal or foraminal narrowing.  Upper chest: No acute abnormality in the upper chest or imaged lung apices. Extensive calcification of the proximal great vessels and cervical carotids. Other: Diffusely enlarged and heterogeneous thyroid gland.  IMPRESSION: 1. Known focus of acute ischemia in the dorsal lateral left thalamus is poorly visualized on CT examination, much better seen on the more sensitive diffusion-weighted imaging performed several hours prior. 2. No new visible areas of ischemia or acute intracranial abnormality. 3. No significant scalp swelling or hematoma. No calvarial fracture. 4. Stable parenchymal volume loss and chronic microvascular ischemic changes. 5. No acute fracture or malalignment of the cervical spine. 6. Multilevel degenerative disc disease and facet degenerative changes of the cervical spine as described. 7. Diffusely enlarged and heterogeneous thyroid gland. Consider further evaluation with thyroid ultrasound. This follows consensus guidelines: Managing Incidental Thyroid Nodules Detected on Imaging: White Paper of the ACR Incidental Thyroid Findings Committee. J Am Coll Radiol 2015; 12:143-150. and [facility] 3-tiered system for managing ITNs: J Am Coll Radiol. 2015; Feb;12(2): 143-50 8. Cervical and intracranial atherosclerosis. Electronically Signed   By: Lovena Le M.D.   On: 03/31/2020 03:49   MR BRAIN WO CONTRAST  Result Date: 03/30/2020 CLINICAL DATA:  Right-sided numbness EXAM: MRI HEAD WITHOUT CONTRAST TECHNIQUE: Multiplanar, multiecho pulse sequences of the brain and surrounding structures were obtained without intravenous contrast. COMPARISON:  Head CT 03/30/2020 Brain MRI 04/18/2017 FINDINGS: Brain: Punctate focus of abnormal diffusion restriction within the dorsolateral left thalamus. Small, old left cerebellar infarct. Multifocal white matter hyperintensity, most commonly due to chronic ischemic microangiopathy. Normal volume of CSF spaces. No chronic microhemorrhage. Normal midline structures. Vascular: Normal flow voids. Skull and upper cervical spine: Normal marrow signal. Sinuses/Orbits: Negative. Other: None. IMPRESSION: 1. Punctate focus of acute ischemia within the dorsolateral left thalamus. No  hemorrhage or mass effect. 2. Small, old left cerebellar infarct and findings of chronic ischemic microangiopathy. Electronically Signed   By: Ulyses Jarred M.D.   On: 03/30/2020 22:11   US Carotid Bilateral (at [facility] and [facility] only)  Result Date: 03/31/2020 CLINICAL DATA:  CVA. Right-sided numbness. History of CAD, hyperlipidemia and diabetes. Former smoker. EXAM: BILATERAL CAROTID DUPLEX ULTRASOUND TECHNIQUE: Pearline Cables scale imaging, color Doppler and duplex ultrasound were performed of bilateral carotid and vertebral arteries in the neck. COMPARISON:  None. FINDINGS: Criteria: Quantification of carotid stenosis is based on velocity parameters that correlate the residual internal carotid diameter with NASCET-based stenosis levels, using the diameter of the distal internal carotid lumen as the denominator for stenosis measurement. The following velocity measurements were obtained: RIGHT ICA: 131/30 cm/sec CCA: 22/02 cm/sec SYSTOLIC ICA/CCA RATIO:  1.5 ECA: 104 cm/sec LEFT ICA: 80/14 cm/sec CCA: 54/27 cm/sec SYSTOLIC ICA/CCA RATIO:  0.8 ECA: 132 cm/sec RIGHT CAROTID ARTERY: There is a moderate amount plaque involving the mid aspect of the right common carotid artery of echogenic (images 7 and 9. There is a minimal amount of eccentric echogenic plaque involving the origin of the right internal carotid artery (image 26, which results in borderline elevated peak systolic velocity with the proximal aspect of the right internal carotid artery. Greatest acquired peak systolic velocity with the proximal right ICA measures 131 centimeters/second (image 28). RIGHT VERTEBRAL ARTERY:  Antegrade Flow LEFT CAROTID ARTERY: There is a moderate amount of eccentric echogenic plaque involving the proximal aspect of the left common carotid artery (image 37). There is a minimal amount of eccentric echogenic plaque involving the origin and proximal aspect the left internal carotid artery (image 53), not resulting in elevated peak systolic  velocities within the interrogated course the left internal carotid artery to suggest a hemodynamically significant stenosis. Borderline elevated peak systolic velocity within distal aspect the left internal carotid artery is felt to be factitious slightly elevated due to sampling at a location of turbulent flow (image 63). LEFT VERTEBRAL ARTERY:  Antegrade flow IMPRESSION: 1. Moderate amount of right-sided atherosclerotic plaque results in borderline elevated peak systolic velocities within the right internal carotid artery compatible with the lower end of the 50-69% luminal narrowing range. Further evaluation with CTA could performed as clinically indicated. 2. Moderate amount of left-sided atherosclerotic plaque, not definitely resulting in a hemodynamically significant stenosis. Electronically Signed   By: Sandi Mariscal M.D.   On: 03/31/2020 08:36    Subjective: Patient continued to have mild right sided facial numbness.  No other deficit.  Discharge Exam: Vitals:   03/31/20 0742 03/31/20 1300  BP: (!) 159/81 (!) 150/85  Pulse: 86 85  Resp: 20   Temp: 98.1 F (36.7 C)   SpO2:     Vitals:   03/31/20 0030 03/31/20 0255 03/31/20 0742 03/31/20 1300  BP: (!) 144/58 (!) 157/62 (!) 159/81 (!) 150/85  Pulse: 80 77 86 85  Resp: 20 (!) 22 20   Temp:  97.8 F (36.6 C) 98.1 F (36.7 C)   TempSrc:  Oral Oral   SpO2: 94% 96%    Weight:      Height:        General: Pt is alert, awake, not in acute distress Cardiovascular: RRR, S1/S2 +, no rubs, no gallops Respiratory: CTA bilaterally, no wheezing, no rhonchi Abdominal: Soft, NT, ND, bowel sounds + Extremities: no edema, no cyanosis   The results of significant diagnostics from this hospitalization (including imaging, microbiology, ancillary and laboratory) are listed below for reference.    Microbiology: Recent Results (from the past 240 hour(s))  SARS CORONAVIRUS 2 (TAT 6-24 HRS) Nasopharyngeal Nasopharyngeal Swab     Status: None    Collection Time: 03/25/20 11:13 AM   Specimen: Nasopharyngeal Swab  Result Value Ref Range Status   SARS Coronavirus 2 NEGATIVE NEGATIVE Final    Comment: (NOTE) SARS-CoV-2 target nucleic acids are NOT DETECTED. The SARS-CoV-2 RNA is generally detectable in upper and lower respiratory specimens during the acute phase of infection. Negative results do not preclude SARS-CoV-2 infection, do not rule out co-infections with other pathogens, and should not be used as the sole basis for treatment or other patient management decisions. Negative results must be combined with clinical observations, patient history, and epidemiological information. The expected result is Negative. Fact Sheet for Patients: SugarRoll.be Fact Sheet for Healthcare Providers: https://www.woods-mathews.com/ This test is not yet approved or cleared by the [place] FDA and  has been authorized for detection and/or diagnosis of SARS-CoV-2 by FDA under an Emergency Use Authorization (EUA). This EUA will remain  in effect (meaning this test can be used) for the duration of the COVID-19 declaration under Section 56 4(b)(1) of the Act, 21 U.S.C. section 360bbb-3(b)(1), unless the authorization is terminated or revoked sooner. Performed at [facility], [place] [address], [place], [place] [address]   SARS Coronavirus 2 by RT PCR (hospital order, performed in [facility] hospital lab) Nasopharyngeal Nasopharyngeal Swab     Status: None   Collection Time: 03/30/20  6:24 PM   Specimen: Nasopharyngeal Swab  Result Value Ref Range Status   SARS Coronavirus 2 NEGATIVE NEGATIVE Final    Comment: (NOTE) SARS-CoV-2 target nucleic acids are NOT DETECTED. The  SARS-CoV-2 RNA is generally detectable in upper and lower respiratory specimens during the acute phase of infection. The lowest concentration of SARS-CoV-2 viral copies this assay can detect is 250 copies / mL. A negative result  does not preclude SARS-CoV-2 infection and should not be used as the sole basis for treatment or other patient management decisions.  A negative result may occur with improper specimen collection / handling, submission of specimen other than nasopharyngeal swab, presence of viral mutation(s) within the areas targeted by this assay, and inadequate number of viral copies (<250 copies / mL). A negative result must be combined with clinical observations, patient history, and epidemiological information. Fact Sheet for Patients:   StrictlyIdeas.no Fact Sheet for Healthcare Providers: BankingDealers.co.za This test is not yet approved or cleared  by the [place] FDA and has been authorized for detection and/or diagnosis of SARS-CoV-2 by FDA under an Emergency Use Authorization (EUA).  This EUA will remain in effect (meaning this test can be used) for the duration of the COVID-19 declaration under Section 564(b)(1) of the Act, 21 U.S.C. section 360bbb-3(b)(1), unless the authorization is terminated or revoked sooner. Performed at [facility], [address] [place], [place] [address]      Labs: BNP (last 3 results) No results for input(s): BNP in the last 8760 hours. Basic Metabolic Panel: Recent Labs  Lab 03/30/20 1236  NA 137  K 4.0  CL 105  CO2 24  GLUCOSE 90  BUN 19  CREATININE 0.94  CALCIUM 9.7   Liver Function Tests: Recent Labs  Lab 03/30/20 1236  AST 18  ALT 18  ALKPHOS 123  BILITOT 0.6  PROT 7.5  ALBUMIN 4.1   No results for input(s): LIPASE, AMYLASE in the last 168 hours. No results for input(s): AMMONIA in the last 168 hours. CBC: Recent Labs  Lab 03/30/20 1236  WBC 6.6  NEUTROABS 3.6  HGB 11.9*  HCT 36.4  MCV 80.4  PLT 319   Cardiac Enzymes: No results for input(s): CKTOTAL, CKMB, CKMBINDEX, TROPONINI in the last 168 hours. BNP: Invalid input(s): POCBNP CBG: Recent Labs  Lab  03/29/20 0723 03/31/20 0350 03/31/20 0740 03/31/20 1237  GLUCAP 129* 94 80 127*   D-Dimer No results for input(s): DDIMER in the last 72 hours. Hgb A1c Recent Labs    03/31/20 0514  HGBA1C 6.5*   Lipid Profile Recent Labs    03/31/20 0514  CHOL 126  HDL 34*  LDLCALC 77  TRIG 74  CHOLHDL 3.7   Thyroid function studies No results for input(s): TSH, T4TOTAL, T3FREE, THYROIDAB in the last 72 hours.  Invalid input(s): FREET3 Anemia work up No results for input(s): VITAMINB12, FOLATE, FERRITIN, TIBC, IRON, RETICCTPCT in the last 72 hours. Urinalysis    Component Value Date/Time   COLORURINE YELLOW (A) 11/22/2016 1138   APPEARANCEUR CLEAR (A) 11/22/2016 1138   LABSPEC 1.019 11/22/2016 1138   PHURINE 5.0 11/22/2016 1138   GLUCOSEU NEGATIVE 11/22/2016 1138   HGBUR NEGATIVE 11/22/2016 1138   BILIRUBINUR NEGATIVE 11/22/2016 1138   KETONESUR NEGATIVE 11/22/2016 1138   PROTEINUR NEGATIVE 11/22/2016 1138   NITRITE NEGATIVE 11/22/2016 1138   LEUKOCYTESUR NEGATIVE 11/22/2016 1138   Sepsis Labs Invalid input(s): PROCALCITONIN,  WBC,  LACTICIDVEN Microbiology Recent Results (from the past 240 hour(s))  SARS CORONAVIRUS 2 (TAT 6-24 HRS) Nasopharyngeal Nasopharyngeal Swab     Status: None   Collection Time: 03/25/20 11:13 AM   Specimen: Nasopharyngeal Swab  Result Value Ref Range Status   SARS  Coronavirus 2 NEGATIVE NEGATIVE Final    Comment: (NOTE) SARS-CoV-2 target nucleic acids are NOT DETECTED. The SARS-CoV-2 RNA is generally detectable in upper and lower respiratory specimens during the acute phase of infection. Negative results do not preclude SARS-CoV-2 infection, do not rule out co-infections with other pathogens, and should not be used as the sole basis for treatment or other patient management decisions. Negative results must be combined with clinical observations, patient history, and epidemiological information. The expected result is Negative. Fact Sheet for  Patients: SugarRoll.be Fact Sheet for Healthcare Providers: https://www.woods-mathews.com/ This test is not yet approved or cleared by the [place] FDA and  has been authorized for detection and/or diagnosis of SARS-CoV-2 by FDA under an Emergency Use Authorization (EUA). This EUA will remain  in effect (meaning this test can be used) for the duration of the COVID-19 declaration under Section 56 4(b)(1) of the Act, 21 U.S.C. section 360bbb-3(b)(1), unless the authorization is terminated or revoked sooner. Performed at [facility], [place] [address], [place], [place] [address]   SARS Coronavirus 2 by RT PCR (hospital order, performed in [facility] hospital lab) Nasopharyngeal Nasopharyngeal Swab     Status: None   Collection Time: 03/30/20  6:24 PM   Specimen: Nasopharyngeal Swab  Result Value Ref Range Status   SARS Coronavirus 2 NEGATIVE NEGATIVE Final    Comment: (NOTE) SARS-CoV-2 target nucleic acids are NOT DETECTED. The SARS-CoV-2 RNA is generally detectable in upper and lower respiratory specimens during the acute phase of infection. The lowest concentration of SARS-CoV-2 viral copies this assay can detect is 250 copies / mL. A negative result does not preclude SARS-CoV-2 infection and should not be used as the sole basis for treatment or other patient management decisions.  A negative result may occur with improper specimen collection / handling, submission of specimen other than nasopharyngeal swab, presence of viral mutation(s) within the areas targeted by this assay, and inadequate number of viral copies (<250 copies / mL). A negative result must be combined with clinical observations, patient history, and epidemiological information. Fact Sheet for Patients:   StrictlyIdeas.no Fact Sheet for Healthcare Providers: BankingDealers.co.za This test is not yet approved or cleared   by the [place] FDA and has been authorized for detection and/or diagnosis of SARS-CoV-2 by FDA under an Emergency Use Authorization (EUA).  This EUA will remain in effect (meaning this test can be used) for the duration of the COVID-19 declaration under Section 564(b)(1) of the Act, 21 U.S.C. section 360bbb-3(b)(1), unless the authorization is terminated or revoked sooner. Performed at [facility], [address] [place], [place] [address]     Time coordinating discharge: Over 30 minutes  SIGNED:  Lorella Nimrod, MD  [facility] 03/31/2020, 3:21 PM  If 7PM-7AM, please contact night-coverage [email or web address]  This record has been created using Systems analyst. Errors have been sought and corrected,but may not always be located. Such creation errors do not reflect on the standard of care.

## 2020-03-31 NOTE — Evaluation (Signed)
Physical Therapy Evaluation Patient Details Name: Melanie Ray MRN: [id number] DOB: May 12, 1951 Today's Date: 03/31/2020   History of Present Illness  presented to ER secondary to cough, SOB, LE edema; admitted for TIA/CVA work up.  MRI significant for L thalamic infarct  Clinical Impression  Upon evaluation, patient alert and oriented; follows commands and demonstrates good effort with mobility tasks.  Bilat UE/LE strength and ROM grossly symmetrical and WFL; no focal weakness appreciated.  Does endorse persistent paresthesia to R UE/face; able to detect light touch and all sensory input, but diminished compared to L UE.  Able to complete bed mobility with indep; sit/stand, basic transfers and gait (175') without assist device, cga/close sup.  Demonstrates reciprocal stepping pattern with fair/good step height/length; limited trunk rotation and arm swing, but does improve with cuing for normalized cadence/gait speed.  Completes head turns, speed modulation and turns/change of direction with minimal/no gait deviations, LOB or safety concerns.  Feels near baseline, but does endorse a continued sense of "unsteadiness" at times. Would benefit from skilled PT to address above deficits and promote optimal return to PLOF.; recommend transition to home with outpatient PT follow up as medically appropriate.    Follow Up Recommendations Outpatient PT    Equipment Recommendations       Recommendations for Other Services       Precautions / Restrictions Precautions Precautions: Fall Restrictions Weight Bearing Restrictions: No      Mobility  Bed Mobility Overal bed mobility: Modified Independent                Transfers Overall transfer level: Modified independent Equipment used: None             General transfer comment: good LE strength/control  Ambulation/Gait Ambulation/Gait assistance: Supervision Gait Distance (Feet): 175 Feet Assistive device: None        General Gait Details: reciprocal stepping pattern with fair/good step height/length; limited trunk rotation and arm swing, but does improve with cuing for normalized cadence/gait speed.  Completes head turns, speed modulation and turns/change of direction with minimal/no gait deviations, LOB or safety concerns  Stairs            Wheelchair Mobility    Modified Rankin (Stroke Patients Only)       Balance Overall balance assessment: Needs assistance Sitting-balance support: No upper extremity supported;Feet supported Sitting balance-Leahy Scale: Normal     Standing balance support: No upper extremity supported Standing balance-Leahy Scale: Good                               Pertinent Vitals/Pain Pain Assessment: No/denies pain    Home Living Family/patient expects to be discharged to:: Private residence Living Arrangements: Spouse/significant other Available Help at Discharge: Family;Available 24 hours/day Type of Home: House Home Access: Stairs to enter Entrance Stairs-Rails: Psychiatric nurse of Steps: 4 Home Layout: Two level;Bed/bath upstairs;Able to live on main level with bedroom/bathroom Home Equipment: None      Prior Function Level of Independence: Independent         Comments: Indep with ADLs, household and community mobilization without assist device; + driving, denies fall history.  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  Does endorse persistent paresthesia R UE and face (detects light touch,  but diminished compared to L))    Lower Extremity Assessment Lower Extremity Assessment: Overall WFL for tasks assessed(grossly 4+ to 5/5 throughout; no focal weakness, sensory or coordination deficits)       Communication    Communication: No difficulties  Cognition Arousal/Alertness: Awake/alert Behavior During Therapy: WFL for tasks assessed/performed Overall Cognitive Status: Within Functional Limits for tasks assessed                                        General Comments      Exercises Other Exercises Other Exercises: Toilet transfer, ambulatory without assist device, sup/mod indep; sit/stand from standard toilet height, mod indep; standing balance at sink for hand hygiene, close sup/mod indep.  good awareness of safety needs and overall limits of stability.   Assessment/Plan    PT Assessment Patient needs continued PT services  PT Problem List Decreased balance;Decreased mobility;Impaired sensation       PT Treatment Interventions DME instruction;Gait training;Stair training;Functional mobility training;Therapeutic activities;Therapeutic exercise;Balance training;Neuromuscular re-education;Cognitive remediation;Patient/family education    PT Goals (Current goals can be found in the Care Plan section)  Acute Rehab PT Goals Patient Stated Goal: to go to the bathroom PT Goal Formulation: With patient Time For Goal Achievement: 04/14/20 Potential to Achieve Goals: Good    Frequency Min 2X/week   Barriers to discharge        Co-evaluation               AM-PAC PT "6 Clicks" Mobility  Outcome Measure Help needed turning from your back to your side while in a flat bed without using bedrails?: None Help needed moving from lying on your back to sitting on the side of a flat bed without using bedrails?: None Help needed moving to and from a bed to a chair (including a wheelchair)?: None Help needed standing up from a chair using your arms (e.g., wheelchair or bedside chair)?: None Help needed to walk in hospital room?: A Little Help needed climbing 3-5 steps with a railing? : A Little 6 Click Score: 22    End of Session Equipment Utilized During Treatment: Gait  belt Activity Tolerance: Patient tolerated treatment well Patient left: (in transport chair for transitiont to CT with tech) Nurse Communication: Mobility status PT Visit Diagnosis: Hemiplegia and hemiparesis Hemiplegia - Right/Left: Right Hemiplegia - dominant/non-dominant: Non-dominant Hemiplegia - caused by: Cerebral infarction    [id number] PT Time Calculation (min) (ACUTE ONLY): 25 min   Charges:   PT Evaluation $PT Eval Moderate Complexity: 1 Mod PT Treatments $Therapeutic Activity: 8-22 mins        Chee Dimon H. Owens Shark, PT, DPT, NCS 03/31/20, 9:57 AM [phone]

## 2020-04-01 ENCOUNTER — Telehealth: Payer: Self-pay

## 2020-04-01 NOTE — Telephone Encounter (Signed)
Transition Care Management Follow-up Telephone Call  Date of discharge and from where: 03/31/20 from [facility]  How have you been since you were released from the hospital? Patient states, "I am still have tingling, numbness on R side of body. No falls, dizziness, headache. No change in appetite. Drinking plenty of water. No BM since Tuesday. Nausea present. Bladder appropriate output. Mouth is twisted intermittently upon waking and resumes normalcy throughout the day.   Any questions or concerns? Nausea. Request zofran be sent to local pharmacy.   Items Reviewed:  Did the pt receive and understand the discharge instructions provided? Increase activity as tolerated. Rest.   Medications obtained and verified? Add plavix and aspirin. Taking all other scheduled medications as directed.   Any new allergies since your discharge? None.  Dietary orders reviewed? Yes, low sodium, heart healthy.   Do you have support at home? Yes, husband.  Functional Questionnaire: (I = Independent and D = Dependent) ADLs: I  Bathing/Dressing- I  Meal Prep- Husband assist   Eating- I  Maintaining continence- I  Transferring/Ambulation- I  Managing Meds- I  Follow up appointments reviewed:   PCP Hospital f/u appt confirmed?  Scheduled to see Dr. Caryl Bis on 04/06/20 @ 11:30.  Are transportation arrangements needed? No  If their condition worsens, is the pt aware to call PCP or go to the Emergency Dept.? Yes  Was the patient provided with contact information for the PCP's office or ED? Yes  Was to pt encouraged to call back with questions or concerns? Yes

## 2020-04-01 NOTE — Telephone Encounter (Signed)
Reviewed

## 2020-04-05 ENCOUNTER — Encounter: Payer: Self-pay | Admitting: Gastroenterology

## 2020-04-05 ENCOUNTER — Other Ambulatory Visit: Payer: Self-pay | Admitting: Cardiovascular Disease

## 2020-04-06 ENCOUNTER — Ambulatory Visit (INDEPENDENT_AMBULATORY_CARE_PROVIDER_SITE_OTHER): Payer: Medicare Other | Admitting: Family Medicine

## 2020-04-06 ENCOUNTER — Other Ambulatory Visit: Payer: Self-pay

## 2020-04-06 ENCOUNTER — Encounter: Payer: Self-pay | Admitting: Family Medicine

## 2020-04-06 VITALS — BP 120/70 | HR 75 | Temp 96.4°F | Ht 63.0 in | Wt 168.0 lb

## 2020-04-06 DIAGNOSIS — I5022 Chronic systolic (congestive) heart failure: Secondary | ICD-10-CM

## 2020-04-06 DIAGNOSIS — I639 Cerebral infarction, unspecified: Secondary | ICD-10-CM | POA: Diagnosis not present

## 2020-04-06 DIAGNOSIS — N951 Menopausal and female climacteric states: Secondary | ICD-10-CM | POA: Diagnosis not present

## 2020-04-06 DIAGNOSIS — G4700 Insomnia, unspecified: Secondary | ICD-10-CM

## 2020-04-06 LAB — BASIC METABOLIC PANEL
BUN: 22 mg/dL (ref 6–23)
CO2: 24 mEq/L (ref 19–32)
Calcium: 10 mg/dL (ref 8.4–10.5)
Chloride: 102 mEq/L (ref 96–112)
Creatinine, Ser: 1.09 mg/dL (ref 0.40–1.20)
GFR: 60.06 mL/min (ref >60.00)
Glucose, Bld: 79 mg/dL (ref 70–99)
Potassium: 4.4 mEq/L (ref 3.5–5.1)
Sodium: 134 mEq/L — ABNORMAL LOW (ref 135–145)

## 2020-04-06 LAB — CBC
HCT: 36.6 % (ref 36.0–46.0)
Hemoglobin: 12.2 g/dL (ref 12.0–15.0)
MCHC: 33.4 g/dL (ref 30.0–36.0)
MCV: 81.1 fl (ref 78.0–100.0)
Platelets: 335 10*3/uL (ref 150.0–400.0)
RBC: 4.52 Mil/uL (ref 3.87–5.11)
RDW: 14.3 % (ref 11.5–15.5)
WBC: 9.9 10*3/uL (ref 4.0–10.5)

## 2020-04-06 NOTE — Assessment & Plan Note (Signed)
Ok to continue melatonin.

## 2020-04-06 NOTE — Progress Notes (Signed)
Tommi Rumps, MD Phone: [phone]  Melanie Ray is a 69 y.o. female who presents today for follow-up.  Patient was hospitalized from 03/30/2020-03/31/2020.  She was hospitalized for stroke.  She developed tingling affecting the right face and arm into her hands.  She also had numbness and some drooling.  She went to the emergency room and was diagnosed with an acute left thalamic infarct.  She was on aspirin and a statin prior to arrival and was placed on Plavix at the hospital.  She was discharged home to complete 3 weeks of combined aspirin and Plavix.  After 3 weeks she will discontinue the aspirin.  She notes overall doing fairly well.  She does continue to have some numbness and tingling in her face as well as some occasional drooling.  She notes her right arm symptoms have been progressively improving.  She had an echo that revealed an EF of less than 20%.  She does follow with cardiology for CHF.  The echo was negative on the bubble study.  Patient also notes she has started melatonin for sleep and started on primrose and black cohosh for hot flashes.  Discharge summary reviewed.  Medications reviewed.  Social History   Tobacco Use  Smoking Status Former Smoker   Types: Cigarettes   Quit date: 04/13/1997   Years since quitting: 22.9  Smokeless Tobacco Never Used     ROS see history of present illness  Objective  Physical Exam Vitals:   04/06/20 1133  BP: 120/70  Pulse: 75  Temp: (!) 96.4 F (35.8 C)  SpO2: 97%    BP Readings from Last 3 Encounters:  04/06/20 120/70  03/31/20 (!) 147/70  03/29/20 (!) 138/55   Wt Readings from Last 3 Encounters:  04/06/20 168 lb (76.2 kg)  03/30/20 167 lb (75.8 kg)  03/29/20 167 lb (75.8 kg)    Physical Exam Constitutional:      General: She is not in acute distress.    Appearance: She is not diaphoretic.  Cardiovascular:     Rate and Rhythm: Normal rate and regular rhythm.     Heart sounds: Normal heart sounds.   Pulmonary:     Effort: Pulmonary effort is normal.     Breath sounds: Normal breath sounds.  Musculoskeletal:     Right lower leg: No edema.     Left lower leg: No edema.  Skin:    General: Skin is warm and dry.  Neurological:     Mental Status: She is alert.     Comments: Smile intact, PERRL, EOMI, hearing intact bilaterally to finger rub, sensation in her face intact bilaterally to light touch, 5/5 strength in bilateral biceps, triceps, grip, quads, hamstrings, plantar and dorsiflexion, sensation to light touch slightly decreased in her right hand though otherwise intact in bilateral UE and LE, normal gait, absent patellar reflexes      Assessment/Plan: Please see individual problem list.  Cerebellar stroke, acute ([place]) New issue.  Status post stroke.  Symptoms slightly improved since discharge.  She will continue on aspirin and Plavix for a total duration of 3 weeks and then discontinue the aspirin.  Will refer to neurology.  She will seek medical attention if she develops any worsening or new strokelike symptoms.  Chronic systolic CHF (congestive heart failure) ([place]) Echo results with worsened EF.  We will send a message to her cardiologist to see if they want any follow-up.  Insomnia Ok to continue melatonin.   Hot flash, menopausal Will check with  our clinical pharmacist regarding the black cohosh and primrose in this patient.   Orders Placed This Encounter  Procedures   Basic Metabolic Panel (BMET)   CBC   Ambulatory referral to Neurology    Referral Priority:   Routine    Referral Type:   Consultation    Referral Reason:   Specialty Services Required    Requested Specialty:   Neurology    Number of Visits Requested:   1    No orders of the defined types were placed in this encounter.   This visit occurred during the SARS-CoV-2 public health emergency.  Safety protocols were in place, including screening questions prior to the visit, additional usage of staff  PPE, and extensive cleaning of exam room while observing appropriate contact time as indicated for disinfecting solutions.    Tommi Rumps, MD [place]

## 2020-04-06 NOTE — Assessment & Plan Note (Signed)
New issue.  Status post stroke.  Symptoms slightly improved since discharge.  She will continue on aspirin and Plavix for a total duration of 3 weeks and then discontinue the aspirin.  Will refer to neurology.  She will seek medical attention if she develops any worsening or new strokelike symptoms.

## 2020-04-06 NOTE — Assessment & Plan Note (Signed)
Will check with our clinical pharmacist regarding the black cohosh and primrose in this patient.

## 2020-04-06 NOTE — Patient Instructions (Signed)
Nice to see you. We will get you to see neurology. We will contact you once I hear back from the pharmacist regarding the tremors and blackouts. If you develop any worsening numbness or tingling or new symptoms such as weakness or numbness or tingling elsewhere please seek medical attention in the emergency department.

## 2020-04-06 NOTE — Assessment & Plan Note (Signed)
Echo results with worsened EF.  We will send a message to her cardiologist to see if they want any follow-up.

## 2020-04-07 ENCOUNTER — Telehealth: Payer: Self-pay | Admitting: Family Medicine

## 2020-04-07 NOTE — Telephone Encounter (Signed)
Please call the patient. I heard back from our pharmacist and she recommended that the patient stop the black cohosh and the primrose. The black cohosh could potentially increase her risk of clotting which is not good post-stroke. The primrose could increase her risk of bleeding with her being on plavix and aspirin. She should stop both of these. Thanks.

## 2020-04-07 NOTE — Telephone Encounter (Signed)
-----   Message from De Hollingshead, [facility] sent at 04/06/2020  5:08 PM EDT ----- Melanie Ray,   Looked up both meds on [employer] Database and in an interaction checker with her meds.   I would recommend stopping both.   For black cohosh, there is not great evidence of benefit, and some thought that the mechanism of supposed benefit on hot flashes is related to estrogen activity, so I would NOT recommend a patient post stroke take that risk.   Evening primrose is thought to possibly increase risk of bleed, so would avoid with concomitant ASA and clopidogrel (and also possibly the SNRI). Also, a metabolite of evening primrose may inhibit CYP2C9, which would increase clopidogrel levels and can cause bradycardia/hypotension.   Catie ----- Message ----- From: Leone Haven, MD Sent: 04/06/2020   2:21 PM EDT To: De Hollingshead, [facility]  Hey Catie,   This patient recently had a stroke and has been on black cohosh and primrose for hot flashes started prior to the stroke. I did not find anything that seemed to preclude the use of these supplements in someone that has had a stroke though wanted to check to see if you knew about any potentially side effects with these supplements. In general I do not recommend supplements, though she already started them and wanted to check on safety. Thanks.   Randall Hiss

## 2020-04-07 NOTE — Telephone Encounter (Signed)
Please let the patient know that her cardiology office will be contacting her to schedule follow-up.

## 2020-04-07 NOTE — Telephone Encounter (Signed)
Patient has an appointment on 04/18/2020 with cardiology.  Sahiba Granholm,cma

## 2020-04-07 NOTE — Telephone Encounter (Signed)
I called the patient and informed her that the provider wanted her to stop taking the black cohost and the primrose and I explained why and the patient understood and stated she would stop them both immediately.  Zeev Deakins,cma

## 2020-04-07 NOTE — Telephone Encounter (Signed)
-----   Message from Liliane Shi, [place] sent at 04/06/2020  6:06 PM EDT ----- Yes.  Thank you for letting me know.  We will get her in for follow up. Scott ----- Message ----- From: Leone Haven, MD Sent: 04/06/2020   2:24 PM EDT To: Liliane Shi, PA-C  Ceasar Mons,   I saw Mrs Driskill today for follow-up from her recent hospitalization for a stroke. She had an echo that revealed an EF <20%. It looks like she was in the 30-35% range the last time this was checked. She saw you recently in April and I wanted to see if you would like her to follow-up sooner given the new echo result. Thanks.   Randall Hiss

## 2020-04-18 ENCOUNTER — Other Ambulatory Visit: Payer: Medicare Other | Admitting: *Deleted

## 2020-04-18 ENCOUNTER — Other Ambulatory Visit: Payer: Self-pay

## 2020-04-18 DIAGNOSIS — I2510 Atherosclerotic heart disease of native coronary artery without angina pectoris: Secondary | ICD-10-CM

## 2020-04-18 DIAGNOSIS — I10 Essential (primary) hypertension: Secondary | ICD-10-CM | POA: Diagnosis not present

## 2020-04-18 DIAGNOSIS — I5022 Chronic systolic (congestive) heart failure: Secondary | ICD-10-CM | POA: Diagnosis not present

## 2020-04-18 LAB — LIPID PANEL
Chol/HDL Ratio: 2.9 ratio (ref 0.0–4.4)
Cholesterol, Total: 112 mg/dL (ref 100–199)
HDL: 39 mg/dL — ABNORMAL LOW (ref >39)
LDL Chol Calc (NIH): 59 mg/dL (ref 0–99)
Triglycerides: 65 mg/dL (ref 0–149)
VLDL Cholesterol Cal: 14 mg/dL (ref 5–40)

## 2020-04-18 LAB — COMPREHENSIVE METABOLIC PANEL
ALT: 19 IU/L (ref 0–32)
AST: 18 IU/L (ref 0–40)
Albumin/Globulin Ratio: 1.8 (ref 1.2–2.2)
Albumin: 4.2 g/dL (ref 3.8–4.8)
Alkaline Phosphatase: 132 IU/L — ABNORMAL HIGH (ref 48–121)
BUN/Creatinine Ratio: 18 (ref 12–28)
BUN: 16 mg/dL (ref 8–27)
Bilirubin Total: 0.3 mg/dL (ref 0.0–1.2)
CO2: 20 mmol/L (ref 20–29)
Calcium: 9.5 mg/dL (ref 8.7–10.3)
Chloride: 107 mmol/L — ABNORMAL HIGH (ref 96–106)
Creatinine, Ser: 0.91 mg/dL (ref 0.57–1.00)
GFR calc Af Amer: 74 mL/min/{1.73_m2} (ref >59)
GFR calc non Af Amer: 65 mL/min/{1.73_m2} (ref >59)
Globulin, Total: 2.3 g/dL (ref 1.5–4.5)
Glucose: 134 mg/dL — ABNORMAL HIGH (ref 65–99)
Potassium: 4.2 mmol/L (ref 3.5–5.2)
Sodium: 140 mmol/L (ref 134–144)
Total Protein: 6.5 g/dL (ref 6.0–8.5)

## 2020-04-21 ENCOUNTER — Telehealth: Payer: Self-pay | Admitting: Family Medicine

## 2020-04-21 NOTE — Telephone Encounter (Signed)
PT called in for results.

## 2020-04-21 NOTE — Telephone Encounter (Signed)
I called and spoke with the patient and informed her that she had labs done at this office on 04/06/20 and they were all normal but she also had labs done on 04/18/20 at her cardiologist office and she stated she will call them for those results, she got the dates mixed up.  Colie Josten,cma

## 2020-04-26 ENCOUNTER — Telehealth: Payer: Self-pay | Admitting: *Deleted

## 2020-04-26 NOTE — Telephone Encounter (Signed)
   [facility] Pre-operative Risk Assessment    HEARTCARE STAFF: - Please ensure there is not already an duplicate clearance open for this procedure. - Under Visit Info/Reason for Call, type in Other and utilize the format Clearance MM/DD/YY or Clearance TBD. Do not use dashes or single digits. - If request is for dental extraction, please clarify the # of teeth to be extracted.  Request for surgical clearance:  1. What type of surgery is being performed? TOOTH EXTRACTION ( DID LEAVE MESSAGE TO PLEASE CALL BACK WITH NUMBER OF TEETH TO BE EXTRACTED)   2. When is this surgery scheduled? TBD   3. What type of clearance is required (medical clearance vs. Pharmacy clearance to hold med vs. Both)? MEDICAL  4. Are there any medications that need to be held prior to surgery and how long? PLAVIX   5. Practice name and name of physician performing surgery? [facility]; DR. Evangeline Gula CROSS,DDS  6. What is the office phone number? [phone]   7.   What is the office fax number? [phone]  8.   Anesthesia type (None, local, MAC, general) ? NITROUS OXIDE AND LOCAL   Julaine Hua 04/26/2020, 4:00 PM  _________________________________________________________________   (provider comments below)

## 2020-04-27 NOTE — Telephone Encounter (Signed)
   Primary Cardiologist: Sherren Mocha, MD  Chart reviewed as part of pre-operative protocol coverage.  Hx of anterior STEMI in 5/17 tx with DES x 2 to the LAD.  She was not having any anginal symptoms when last seen by Richardson Dopp, PA [phone]. Patient on DAPT with ASA and Plavix.   Dr. Burt Knack, Patient needs to hold Plavix for simple tooth extraction? Please forward your response to P CV DIV PREOP.   Thank you  Leanor Kail, PA 04/27/2020, 9:17 AM

## 2020-04-27 NOTE — Telephone Encounter (Signed)
S/w dental office and confirmed only 1 tooth to be extracted. I will update the pre op provider.

## 2020-04-27 NOTE — Telephone Encounter (Signed)
Would be ok to hold ASA and keep taking plavix. Shouldn't be excessive bleeding risk to pull a single tooth on plavix. thanks

## 2020-04-28 NOTE — Telephone Encounter (Signed)
Reviewed instructions with patient. She understands she may hold ASA but continue taking Plavix as there shouldn't be excessive bleeding risk for a single extraction.   She will discuss with Scott at her follow-up next week if she can stop ASA altogether.  Clearance faxed to Dr. Donavan Burnet Fax: [phone]

## 2020-05-02 NOTE — Progress Notes (Signed)
Cardiology Office Note:    Date:  05/03/2020   ID:  Melanie Ray, Melanie Ray November 26, 1950, MRN [id number]  PCP:  Melanie Haven, MD  Cardiologist:  Melanie Mocha, MD  Electrophysiologist:  None   Referring MD: Melanie Haven, MD   Chief Complaint:  Follow-up (CHF)    Patient Profile:    Melanie Ray is a 69 y.o. female with:   Coronary artery disease  ? S/p Anterior STEMI 5/17 >> PCI: DES x 2 to LAD  Chronic systolic CHF ? Ischemic CM ? EF 30-35 at time of MI >> DC on [facility] >> Pt DC'd at FU visit ? Echocardiogram 8/17: EF 25-30 ? Echocardiogram 3/18: EF 30-35 ? Echocardiogram 8/19: EF 30-35 ? EP eval (Melanie Ray) >> pt declined CRT-D ? Intol of Entresto ? Echocardiogram 03/2020: EF < 20  Obsessive Compulsive d/o  Diabetes mellitus 2  Hypertension   Hyperlipidemia   LBBB  OSA  Hepatic steatosis   Hx of cerebellar CVA 03/2020  Bilateral vertebral artery stenosis   Prior CV studies: Echocardiogram 03/31/20 (w bubble study) EF < 20, global HK, mod LVH, mod LAE, mod MR, RVSP 41.4, bubble study neg  Head and neck CTA 03/31/20 1. CT head negative for acute abnormality. Small acute infarct in the left thalamus seen on MRI last night is not visualized by CT. No intracranial hemorrhage 2. Atherosclerotic aortic arch and proximal great vessels. Atherosclerotic disease in the carotid bifurcation bilaterally without significant stenosis. 3. Moderate to severe stenosis proximal right vertebral artery which is then patent to the basilar. Severe stenosis proximal left vertebral artery which is diffusely diseased and small but does contribute to the basilar. 4. Occlusion left P3 segment.  Moderate stenosis left P2 segment 5. Anterior circulation widely patent. 6. Multiple thyroid nodules. The largest nodule on the left is 20 mm. Thyroid ultrasound recommended. (Ref: [facility] Am Coll Radiol. 2015 Feb;12(2): 143-50).  Carotid US 03/31/20 R 50-70 (no sig dz  on CTA)  Echo 06/30/18 Mild conc LVH, EF 30-35, ant-sept/inf/inf-sept HK, Gr 1 DD, mild to mod AI, MAC, mobile density on MV unchanged, mild MR, mild TR, PASP 39  Echo 01/30/17 Diff HK, mild focal basal septal hypertrophy, EF 30-35, mild AI, MAC, mild MR  Echo 06/08/16 Mild focal basal septal hypertrophy, EF 25-30%, diff HK, ant-septal AK, Gr 1 DD, mild AI, MAC, mild MR, PASP 37 mmHg  Echo 03/18/16 EF 30-35%, ant-septal AK, Gr 1 DD, mild MR, severe LAE.  LHC 03/17/16 LAD proximal 80%, mid 80%, distal 50%, ostial D1 60%  LCx with LPDA lesion, 30%  RCA Mild calcification with no significant stenosis in a medium caliber, nondominant RCA LVEF is estimated at 45% with inferoapical and lateral wall akinesis PCI: PCI: 3.5 x 24 mm Promus DES to prox LAD, 2.5 x 12 mm Promus DES to mid LAD.  History of Present Illness:    Melanie Ray was last seen in 02/2020.  She was admitted in 03/2020 with an acute L thalamic infarct.  Etiology was felt to likely be small vessel disease.  CTA of the head and neck showed mod to severe bilat vertebral artery stenosis.  There was no significant ICA stenosis.  Antiplatelet Rx was recommended.  Echocardiogram with bubble study was neg but did demonstrated worsening LVF with EF < 20.  She returns for follow up.    She is here alone today.  She continues to have difficulty with balance as well as word finding issues.  She  has also continues to have some tingling in her face and hand.  She has not had chest discomfort.  She has noted shortness of breath with some activities.  She also notes occasional bilateral arm discomfort.  She has not had syncope, orthopnea or PND.  She has not had significant leg swelling.  Past Medical History:  Diagnosis Date  . Anemia   . Anxiety   . Back pain   . CAD (coronary artery disease) 04/11/2016   S/p ant STEMI 5/17: LHC >> LAD proximal 80%, mid 80%, distal 50%, ostial D1 60%; LCx with LPDA lesion 30%; RCA Mild calcification with  no significant stenosis in a medium caliber, nondominant RCA; LVEF is estimated at 45% with inferoapical and lateral wall akinesis >> PCI: PCI: 3.5 x 24 mm Promus DES to prox LAD, 2.5 x 12 mm Promus DES to mid LAD.   [name] Chest pain   . Chronic systolic CHF (congestive heart failure) ([place]) 03/21/2016   Echo 01/30/17: Diff HK, mild focal basal septal hypertrophy, EF 30-35, mild AI, MAC, mild MR // Echo 06/08/16: Mild focal basal septal hypertrophy, EF 25-30%, diff HK, ant-septal AK, Gr 1 DD, mild AI, MAC, mild MR, PASP 37 mmHg // Echo 03/18/16: EF 30-35%, ant-septal AK, Gr 1 DD, mild MR, severe LAE.  [name] Constipation   . Depression   . Diabetes mellitus type 2 in obese ([place])   . Dizziness   . Glaucoma   . Gout   . Heart disease   . Heartburn   . History of acute anterior wall MI 03/17/2016  . History of heart attack   . Hyperlipemia   . Hypertension   . Ischemic cardiomyopathy 10/02/2016   Refused ICD  . Joint pain   . Left bundle branch block   . Myocardial infarction ([place])   . Nausea   . Sleep apnea   . SOB (shortness of breath)   . Suicidal ideation 08/27/2018  . Swelling    feet or legs    Current Medications: Current Meds  Medication Sig  . atorvastatin (LIPITOR) 80 MG tablet Take 1 tablet by mouth once daily (Patient taking differently: Take 80 mg by mouth daily. )  . carvedilol (COREG) 12.5 MG tablet TAKE 1 TABLET BY MOUTH TWICE DAILY WITH MEALS (Patient taking differently: Take 12.5 mg by mouth 2 (two) times daily with a meal. )  . clopidogrel (PLAVIX) 75 MG tablet Take 1 tablet (75 mg total) by mouth daily.  . Cyanocobalamin (VITAMIN B-12 PO) Take 1 capsule by mouth daily.  . dorzolamide-timolol (COSOPT) 22.3-6.8 MG/ML ophthalmic solution Place 1 drop into both eyes 2 (two) times daily.   . furosemide (LASIX) 40 MG tablet Take 1 tablet (40 mg total) by mouth as directed. 40 mg on Mon, Wed and Fri's; all other days 20 mg  . isosorbide mononitrate (IMDUR) 30 MG 24 hr tablet Take 1  tablet (30 mg total) by mouth daily.  [name] latanoprost (XALATAN) 0.005 % ophthalmic solution Place 1 drop into both eyes at bedtime.  [name] LATUDA 60 MG TABS Take 60 mg by mouth daily with breakfast.   . LORazepam (ATIVAN) 1 MG tablet Take 1 mg by mouth at bedtime as needed for sleep.  . metFORMIN (GLUCOPHAGE) 500 MG tablet TAKE 1 TABLET BY MOUTH TWICE DAILY WITH  A  MEAL (Patient taking differently: Take 500 mg by mouth 2 (two) times daily with a meal. )  . Multiple Vitamin (MULTI-VITAMIN DAILY) TABS Take 1 tablet by  mouth daily.  . nitroGLYCERIN (NITROSTAT) 0.4 MG SL tablet Place 1 tablet (0.4 mg total) under the tongue every 5 (five) minutes x 3 doses as needed for chest pain (Do not exceed 3 doses).  . OLANZapine (ZYPREXA) 5 MG tablet Take 1 tablet (5 mg total) by mouth at bedtime. For anxiety and sleep  . spironolactone (ALDACTONE) 25 MG tablet Take 1/2 (one-half) tablet by mouth once daily (Patient taking differently: Take 12.5 mg by mouth daily. TAKE 1/2 (ONE-HALF) TABLET BY MOUTH ONCE DAILY)  . traZODone (DESYREL) 100 MG tablet Take 100 mg by mouth at bedtime as needed for sleep.  [name] venlafaxine XR (EFFEXOR-XR) 150 MG 24 hr capsule TAKE 2 CAPSULES BY MOUTH ONCE DAILY (NEEDS  APPOINTMENT  FOR  FURTHER  REFILLS) (Patient taking differently: Take 300 mg by mouth daily with breakfast. )  . [DISCONTINUED] olmesartan (BENICAR) 20 MG tablet Take 20 mg by mouth daily.     Allergies:   Tetracycline and Meperidine   Social History   Tobacco Use  . Smoking status: Former Smoker    Types: Cigarettes    Quit date: 04/13/1997    Years since quitting: 23.0  . Smokeless tobacco: Never Used  Vaping Use  . Vaping Use: Never used  Substance Use Topics  . Alcohol use: Yes    Alcohol/week: 1.0 standard drink    Types: 1 Glasses of wine per week    Comment: 2-3 glasses of wine per month  . Drug use: No     Family Hx: The patient's family history includes Anxiety disorder in her maternal aunt and mother;  Dementia in her mother; Depression in her mother; Diabetes in her mother; Drug abuse in her cousin; Heart disease in her mother; Heart failure in her mother; High Cholesterol in her father and mother; Hyperlipidemia in her mother; Hypertension in her father and mother; Mood Disorder in her sister; Paranoid behavior in her mother; Stroke in her mother and sister; Tuberculosis in her paternal grandfather.  ROS   EKGs/Labs/Other Test Reviewed:    EKG:  EKG is not ordered today.  The ekg ordered today demonstrates n/a  Recent Labs: 04/06/2020: Hemoglobin 12.2; Platelets 335.0 04/18/2020: ALT 19; BUN 16; Creatinine, Ser 0.91; Potassium 4.2; Sodium 140   Recent Lipid Panel Lab Results  Component Value Date/Time   CHOL 112 04/18/2020 08:15 AM   TRIG 65 04/18/2020 08:15 AM   HDL 39 (L) 04/18/2020 08:15 AM   CHOLHDL 2.9 04/18/2020 08:15 AM   CHOLHDL 3.7 03/31/2020 05:14 AM   LDLCALC 59 04/18/2020 08:15 AM   LDLDIRECT 78.0 02/07/2018 09:28 AM    Physical Exam:    VS:  BP 132/68   Pulse 80   Ht _0  (1.6 m)   Wt 168 lb (76.2 kg)   BMI 29.76 kg/m     Wt Readings from Last 3 Encounters:  05/03/20 168 lb (76.2 kg)  05/03/20 168 lb (76.2 kg)  04/06/20 168 lb (76.2 kg)     Constitutional:      Appearance: Healthy appearance. Not in distress.  Neck:     Thyroid: No thyromegaly.     Vascular: JVD normal.  Pulmonary:     Effort: Pulmonary effort is normal.     Breath sounds: No wheezing. No rales.  Cardiovascular:     Normal rate. Regular rhythm. Normal S1. Normal S2.     Murmurs: There is no murmur.  Edema:    Peripheral edema absent.  Abdominal:  Palpations: Abdomen is soft. There is no hepatomegaly.  Skin:    General: Skin is warm and dry.  Neurological:     Mental Status: Alert and oriented to person, place and time.     Cranial Nerves: Cranial nerves are intact.      ASSESSMENT & PLAN:    1. Chronic systolic CHF (congestive heart failure) ([facility]) Ischemic  cardiomyopathy.  She has declined ICD in the past.  EF has been about 30% in the past.  Recent echo at the time of her stroke demonstrated EF <20%.  NYHA II-IIb.  Volume status appears stable.  Her chart indicates she could not tolerate Entresto in the past.  She notes that she never tried it.  She was concerned about side effects.  We discussed the benefits of Entresto and potential side effects/intolerances.  She is willing to try it.  -Continue current dose of carvedilol, spironolactone  -DC olmesartan  -Start Entresto 24/26 mg twice daily  -Plan follow-up BMET at next office visit  -Consider increasing dose of Entresto at next visit if blood pressure can tolerate  2. Coronary artery disease involving native coronary artery of native heart without angina pectoris 3. Shortness of breath Hx of anterior STEMI in 5/17 tx with DES x 2 to the LAD.   I suspect her shortness of breath is related to her congestive heart failure.  However, this could be an anginal equivalent.  She has also noted occasional bilateral arm discomfort.  It has been 4 years since her myocardial infarction.  We have not done a stress test since that time.  She has a chronic left bundle branch block.  I will arrange a Lexiscan Myoview to assess for significant ischemia.  Continue aspirin, atorvastatin, carvedilol, isosorbide, clopidogrel.  4. Essential hypertension The patient's blood pressure is controlled on her current regimen.     5. Mixed hyperlipidemia LDL optimal on most recent lab work.  Continue current Rx.    6. History of stroke She was recent admitted with cerebellar stroke thought to be secondary to small vessel disease.  She still has some residual effects.  She has seen primary care and referral to neurology is currently pending.    Dispo:  Return in about 4 weeks (around 05/31/2020) for Follow up after testing, w/ Richardson Dopp, PA-C, in person.   Medication Adjustments/Labs and Tests Ordered: Current  medicines are reviewed at length with the patient today.  Concerns regarding medicines are outlined above.  Tests Ordered: Orders Placed This Encounter  Procedures  . MYOCARDIAL PERFUSION IMAGING   Medication Changes: Meds ordered this encounter  Medications  . sacubitril-valsartan (ENTRESTO) 24-26 MG    Sig: Take 1 tablet by mouth 2 (two) times daily.    Dispense:  60 tablet    Refill:  1    Please Honor Card patient is presenting for Carmie Kanner: [id number]; YIFOY: [id number]; MVEHM: [id number]; ISSUER: [id number] ID: [id number]    Signed, Richardson Dopp, PA-C  05/03/2020 5:21 PM    [facility] [address], [place], [place]  [address] Phone: [phone]; Fax: [phone]

## 2020-05-03 ENCOUNTER — Ambulatory Visit (INDEPENDENT_AMBULATORY_CARE_PROVIDER_SITE_OTHER): Payer: Medicare Other

## 2020-05-03 ENCOUNTER — Encounter: Payer: Self-pay | Admitting: Physician Assistant

## 2020-05-03 ENCOUNTER — Ambulatory Visit (INDEPENDENT_AMBULATORY_CARE_PROVIDER_SITE_OTHER): Payer: Medicare Other | Admitting: Physician Assistant

## 2020-05-03 ENCOUNTER — Other Ambulatory Visit: Payer: Self-pay

## 2020-05-03 VITALS — Ht 63.0 in | Wt 168.0 lb

## 2020-05-03 VITALS — BP 132/68 | HR 80 | Ht 63.0 in | Wt 168.0 lb

## 2020-05-03 DIAGNOSIS — I2510 Atherosclerotic heart disease of native coronary artery without angina pectoris: Secondary | ICD-10-CM

## 2020-05-03 DIAGNOSIS — I639 Cerebral infarction, unspecified: Secondary | ICD-10-CM

## 2020-05-03 DIAGNOSIS — I5022 Chronic systolic (congestive) heart failure: Secondary | ICD-10-CM | POA: Diagnosis not present

## 2020-05-03 DIAGNOSIS — I10 Essential (primary) hypertension: Secondary | ICD-10-CM

## 2020-05-03 DIAGNOSIS — Z0000 Encounter for general adult medical examination without abnormal findings: Secondary | ICD-10-CM | POA: Diagnosis not present

## 2020-05-03 DIAGNOSIS — R0602 Shortness of breath: Secondary | ICD-10-CM | POA: Diagnosis not present

## 2020-05-03 DIAGNOSIS — E782 Mixed hyperlipidemia: Secondary | ICD-10-CM

## 2020-05-03 DIAGNOSIS — Z8673 Personal history of transient ischemic attack (TIA), and cerebral infarction without residual deficits: Secondary | ICD-10-CM

## 2020-05-03 MED ORDER — ENTRESTO 24-26 MG PO TABS: 1.0000 | ORAL_TABLET | Freq: Two times a day (BID) | ORAL | 1 refills | Status: DC

## 2020-05-03 NOTE — Patient Instructions (Addendum)
Melanie Ray , Thank you for taking time to come for your Medicare Wellness Visit. I appreciate your ongoing commitment to your health goals. Please review the following plan we discussed and let me know if I can assist you in the future.   These are the goals we discussed: Goals      Patient Stated   .  Increase physical activity (pt-stated)      I would like to use the treadmill more starting 3 days weekly, 15 minutes I would like to eat better       This is a list of the screening recommended for you and due dates:  Health Maintenance  Topic Date Due  .  Hepatitis C: One time screening is recommended by Center for Disease Control  (CDC) for  adults born from [birth year] through 1966.   Never done  . Tetanus Vaccine  Never done  . Pneumonia vaccines (2 of 2 - PCV13) 03/19/2017  . Flu Shot  06/05/2020  . Hemoglobin A1C  10/01/2020  . Complete foot exam   03/01/2021  . Eye exam for diabetics  04/27/2021  . Colon Cancer Screening  03/29/2025  . DEXA scan (bone density measurement)  Completed  . COVID-19 Vaccine  Completed  . Mammogram  Discontinued    Immunizations Immunization History  Administered Date(s) Administered  . Influenza Split 10/03/2005, 09/24/2008, 09/09/2009, 09/15/2010, 08/14/2011  . Influenza, High Dose Seasonal PF 10/15/2018  . Influenza,inj,Quad PF,6+ Mos 07/28/2015  . Influenza-Unspecified 10/04/2016, 10/07/2017, 10/15/2018  . PFIZER SARS-COV-2 Vaccination 12/11/2019, 01/01/2020  . Pneumococcal Polysaccharide-23 03/19/2016   Keep all routine maintenance appointments.   Follow up  06/01/20 @ 10:30  Advanced directives: declined  Conditions/risks identified: none  Follow up in one year for your annual wellness visit    Preventive Care 65 Years and Older, Female Preventive care refers to lifestyle choices and visits with your health care provider that can promote health and wellness. What does preventive care include?  A yearly physical exam. This is  also called an annual well check.  Dental exams once or twice a year.  Routine eye exams. Ask your health care provider how often you should have your eyes checked.  Personal lifestyle choices, including:  Daily care of your teeth and gums.  Regular physical activity.  Eating a healthy diet.  Avoiding tobacco and drug use.  Limiting alcohol use.  Practicing safe sex.  Taking low-dose aspirin every day.  Taking vitamin and mineral supplements as recommended by your health care provider. What happens during an annual well check? The services and screenings done by your health care provider during your annual well check will depend on your age, overall health, lifestyle risk factors, and family history of disease. Counseling  Your health care provider may ask you questions about your:  Alcohol use.  Tobacco use.  Drug use.  Emotional well-being.  Home and relationship well-being.  Sexual activity.  Eating habits.  History of falls.  Memory and ability to understand (cognition).  Work and work Statistician.  Reproductive health. Screening  You may have the following tests or measurements:  Height, weight, and BMI.  Blood pressure.  Lipid and cholesterol levels. These may be checked every 5 years, or more frequently if you are over 72 years old.  Skin check.  Lung cancer screening. You may have this screening every year starting at age 53 if you have a 30-pack-year history of smoking and currently smoke or have quit within the past 15 years.  Fecal occult blood test (FOBT) of the stool. You may have this test every year starting at age 30.  Flexible sigmoidoscopy or colonoscopy. You may have a sigmoidoscopy every 5 years or a colonoscopy every 10 years starting at age 96.  Hepatitis C blood test.  Hepatitis B blood test.  Sexually transmitted disease (STD) testing.  Diabetes screening. This is done by checking your blood sugar (glucose) after you have  not eaten for a while (fasting). You may have this done every 1-3 years.  Bone density scan. This is done to screen for osteoporosis. You may have this done starting at age 6.  Mammogram. This may be done every 1-2 years. Talk to your health care provider about how often you should have regular mammograms. Talk with your health care provider about your test results, treatment options, and if necessary, the need for more tests. Vaccines  Your health care provider may recommend certain vaccines, such as:  Influenza vaccine. This is recommended every year.  Tetanus, diphtheria, and acellular pertussis (Tdap, Td) vaccine. You may need a Td booster every 10 years.  Zoster vaccine. You may need this after age 53.  Pneumococcal 13-valent conjugate (PCV13) vaccine. One dose is recommended after age 5.  Pneumococcal polysaccharide (PPSV23) vaccine. One dose is recommended after age 61. Talk to your health care provider about which screenings and vaccines you need and how often you need them. This information is not intended to replace advice given to you by your health care provider. Make sure you discuss any questions you have with your health care provider. Document Released: 11/18/2015 Document Revised: 07/11/2016 Document Reviewed: 08/23/2015 Elsevier Interactive Patient Education  2017 [employer] Prevention in the Home Falls can cause injuries. They can happen to people of all ages. There are many things you can do to make your home safe and to help prevent falls. What can I do on the outside of my home?  Regularly fix the edges of walkways and driveways and fix any cracks.  Remove anything that might make you trip as you walk through a door, such as a raised step or threshold.  Trim any bushes or trees on the path to your home.  Use bright outdoor lighting.  Clear any walking paths of anything that might make someone trip, such as rocks or tools.  Regularly check to see  if handrails are loose or broken. Make sure that both sides of any steps have handrails.  Any raised decks and porches should have guardrails on the edges.  Have any leaves, snow, or ice cleared regularly.  Use sand or salt on walking paths during winter.  Clean up any spills in your garage right away. This includes oil or grease spills. What can I do in the bathroom?  Use night lights.  Install grab bars by the toilet and in the tub and shower. Do not use towel bars as grab bars.  Use non-skid mats or decals in the tub or shower.  If you need to sit down in the shower, use a plastic, non-slip stool.  Keep the floor dry. Clean up any water that spills on the floor as soon as it happens.  Remove soap buildup in the tub or shower regularly.  Attach bath mats securely with double-sided non-slip rug tape.  Do not have throw rugs and other things on the floor that can make you trip. What can I do in the bedroom?  Use night lights.  Make sure that  you have a light by your bed that is easy to reach.  Do not use any sheets or blankets that are too big for your bed. They should not hang down onto the floor.  Have a firm chair that has side arms. You can use this for support while you get dressed.  Do not have throw rugs and other things on the floor that can make you trip. What can I do in the kitchen?  Clean up any spills right away.  Avoid walking on wet floors.  Keep items that you use a lot in easy-to-reach places.  If you need to reach something above you, use a strong step stool that has a grab bar.  Keep electrical cords out of the way.  Do not use floor polish or wax that makes floors slippery. If you must use wax, use non-skid floor wax.  Do not have throw rugs and other things on the floor that can make you trip. What can I do with my stairs?  Do not leave any items on the stairs.  Make sure that there are handrails on both sides of the stairs and use them.  Fix handrails that are broken or loose. Make sure that handrails are as long as the stairways.  Check any carpeting to make sure that it is firmly attached to the stairs. Fix any carpet that is loose or worn.  Avoid having throw rugs at the top or bottom of the stairs. If you do have throw rugs, attach them to the floor with carpet tape.  Make sure that you have a light switch at the top of the stairs and the bottom of the stairs. If you do not have them, ask someone to add them for you. What else can I do to help prevent falls?  Wear shoes that:  Do not have high heels.  Have rubber bottoms.  Are comfortable and fit you well.  Are closed at the toe. Do not wear sandals.  If you use a stepladder:  Make sure that it is fully opened. Do not climb a closed stepladder.  Make sure that both sides of the stepladder are locked into place.  Ask someone to hold it for you, if possible.  Clearly mark and make sure that you can see:  Any grab bars or handrails.  First and last steps.  Where the edge of each step is.  Use tools that help you move around (mobility aids) if they are needed. These include:  Canes.  Walkers.  Scooters.  Crutches.  Turn on the lights when you go into a dark area. Replace any light bulbs as soon as they burn out.  Set up your furniture so you have a clear path. Avoid moving your furniture around.  If any of your floors are uneven, fix them.  If there are any pets around you, be aware of where they are.  Review your medicines with your doctor. Some medicines can make you feel dizzy. This can increase your chance of falling. Ask your doctor what other things that you can do to help prevent falls. This information is not intended to replace advice given to you by your health care provider. Make sure you discuss any questions you have with your health care provider. Document Released: 08/18/2009 Document Revised: 03/29/2016 Document Reviewed:  11/26/2014 Elsevier Interactive Patient Education  2017 [employer].

## 2020-05-03 NOTE — Progress Notes (Signed)
Subjective:   Melanie Ray is a 69 y.o. female who presents for an Initial Medicare Annual Wellness Visit.  Review of Systems    No ROS.  Medicare Wellness Virtual Visit.   Cardiac Risk Factors include: advanced age (>29mn, >>54women);diabetes mellitus;hypertension     Objective:    Today's Vitals   05/03/20 0841  Weight: 168 lb (76.2 kg)  Height: _0  (1.6 m)   Body mass index is 29.76 kg/m.  Advanced Directives 05/03/2020 03/30/2020 03/29/2020 08/27/2018 08/13/2017 11/22/2016 03/26/2016  Does Patient Have a Medical Advance Directive? _1  No No  Would patient like information on creating a medical advance directive? No - Patient declined No - Patient declined No - Patient declined No - Patient declined - No - Patient declined Yes - Educational materials given  Some encounter information is confidential and restricted. Go to Review Flowsheets activity to see all data.    Current Medications (verified) Outpatient Encounter Medications as of 05/03/2020  Medication Sig  . aspirin EC 81 MG EC tablet Take 1 tablet (81 mg total) by mouth daily.  .Marland Kitchenatorvastatin (LIPITOR) 80 MG tablet Take 1 tablet by mouth once daily (Patient taking differently: Take 80 mg by mouth daily. )  . blood glucose meter kit and supplies KIT Dispense based on patient and insurance preference.  Check glucose once daily fasting in the morning. (FOR ICD 10 E11.9).  . carvedilol (COREG) 12.5 MG tablet TAKE 1 TABLET BY MOUTH TWICE DAILY WITH MEALS (Patient taking differently: Take 12.5 mg by mouth 2 (two) times daily with a meal. )  . clopidogrel (PLAVIX) 75 MG tablet Take 1 tablet (75 mg total) by mouth daily.  . Cyanocobalamin (VITAMIN B-12 PO) Take 1 capsule by mouth daily.  . dorzolamide-timolol (COSOPT) 22.3-6.8 MG/ML ophthalmic solution Place 1 drop into both eyes 2 (two) times daily.   . furosemide (LASIX) 40 MG tablet Take 1 tablet (40 mg total) by mouth as directed. 40 mg on Mon, Wed and  Fri's; all other days 20 mg  . isosorbide mononitrate (IMDUR) 30 MG 24 hr tablet Take 1 tablet (30 mg total) by mouth daily.  .Marland Kitchenlatanoprost (XALATAN) 0.005 % ophthalmic solution Place 1 drop into both eyes at bedtime.  .Marland KitchenLATUDA 60 MG TABS Take 60 mg by mouth daily with breakfast.   . LORazepam (ATIVAN) 1 MG tablet Take 1 mg by mouth at bedtime as needed for sleep.  . metFORMIN (GLUCOPHAGE) 500 MG tablet TAKE 1 TABLET BY MOUTH TWICE DAILY WITH  A  MEAL (Patient taking differently: Take 500 mg by mouth 2 (two) times daily with a meal. )  . Multiple Vitamin (MULTI-VITAMIN DAILY) TABS Take 1 tablet by mouth daily.  . nitroGLYCERIN (NITROSTAT) 0.4 MG SL tablet Place 1 tablet (0.4 mg total) under the tongue every 5 (five) minutes x 3 doses as needed for chest pain (Do not exceed 3 doses).  . OLANZapine (ZYPREXA) 5 MG tablet Take 1 tablet (5 mg total) by mouth at bedtime. For anxiety and sleep  . olmesartan (BENICAR) 20 MG tablet Take 20 mg by mouth daily.  . ondansetron (ZOFRAN) 4 MG tablet TAKE 1 TABLET BY MOUTH ONCE DAILY AS NEEDED FOR NAUSEA AND VOMITING  . spironolactone (ALDACTONE) 25 MG tablet Take 1/2 (one-half) tablet by mouth once daily (Patient taking differently: Take 12.5 mg by mouth daily. TAKE 1/2 (ONE-HALF) TABLET BY MOUTH ONCE DAILY)  . traZODone (DESYREL) 100 MG tablet Take 100 mg  by mouth at bedtime as needed for sleep.  [name] venlafaxine XR (EFFEXOR-XR) 150 MG 24 hr capsule TAKE 2 CAPSULES BY MOUTH ONCE DAILY (NEEDS  APPOINTMENT  FOR  FURTHER  REFILLS) (Patient taking differently: Take 300 mg by mouth daily with breakfast. )   No facility-administered encounter medications on file as of 05/03/2020.    Allergies (verified) Tetracycline and Meperidine   History: Past Medical History:  Diagnosis Date  . Anemia   . Anxiety   . Back pain   . CAD (coronary artery disease) 04/11/2016   S/p ant STEMI 5/17: LHC >> LAD proximal 80%, mid 80%, distal 50%, ostial D1 60%; LCx with LPDA lesion  30%; RCA Mild calcification with no significant stenosis in a medium caliber, nondominant RCA; LVEF is estimated at 45% with inferoapical and lateral wall akinesis >> PCI: PCI: 3.5 x 24 mm Promus DES to prox LAD, 2.5 x 12 mm Promus DES to mid LAD.   [name] Chest pain   . Chronic systolic CHF (congestive heart failure) ([address]) 03/21/2016   Echo 01/30/17: Diff HK, mild focal basal septal hypertrophy, EF 30-35, mild AI, MAC, mild MR // Echo 06/08/16: Mild focal basal septal hypertrophy, EF 25-30%, diff HK, ant-septal AK, Gr 1 DD, mild AI, MAC, mild MR, PASP 37 mmHg // Echo 03/18/16: EF 30-35%, ant-septal AK, Gr 1 DD, mild MR, severe LAE.  [name] Constipation   . Depression   . Diabetes mellitus type 2 in obese ([place])   . Dizziness   . Glaucoma   . Gout   . Heart disease   . Heartburn   . History of acute anterior wall MI 03/17/2016  . History of heart attack   . Hyperlipemia   . Hypertension   . Ischemic cardiomyopathy 10/02/2016   Refused ICD  . Joint pain   . Left bundle branch block   . Myocardial infarction ([place])   . Nausea   . Sleep apnea   . SOB (shortness of breath)   . Suicidal ideation 08/27/2018  . Swelling    feet or legs   Past Surgical History:  Procedure Laterality Date  . APPENDECTOMY    . CARDIAC CATHETERIZATION N/A 03/17/2016   Procedure: Left Heart Cath and Coronary Angiography;  Surgeon: Sherren Mocha, MD;  Location: [place] [facility];  Service: Cardiovascular;  Laterality: N/A;  . CARDIAC CATHETERIZATION N/A 03/17/2016   Procedure: Coronary Stent Intervention;  Surgeon: Sherren Mocha, MD;  Location: [place] [facility];  Service: Cardiovascular;  Laterality: N/A;  . COLONOSCOPY WITH PROPOFOL N/A 08/13/2017   Procedure: COLONOSCOPY WITH PROPOFOL;  Surgeon: Jonathon Bellows, MD;  Location: [facility];  Service: Gastroenterology;  Laterality: N/A;  . COLONOSCOPY WITH PROPOFOL N/A 03/29/2020   Procedure: COLONOSCOPY WITH PROPOFOL;  Surgeon: Lucilla Lame, MD;  Location: [facility] ENDOSCOPY;   Service: Endoscopy;  Laterality: N/A;  . SMALL BOWEL REPAIR    . TONSILLECTOMY    . UTERINE FIBROID SURGERY     Family History  Problem Relation Age of Onset  . Anxiety disorder Mother   . Paranoid behavior Mother   . Hypertension Mother   . Heart failure Mother   . Stroke Mother   . Dementia Mother   . High Cholesterol Mother   . Diabetes Mother   . Hyperlipidemia Mother   . Heart disease Mother   . Depression Mother   . Hypertension Father   . High Cholesterol Father   . Mood Disorder Sister   . Stroke Sister   .  Anxiety disorder Maternal Aunt   . Drug abuse Cousin   . Tuberculosis Paternal Grandfather    Social History   Socioeconomic History  . Marital status: Married    Spouse name: Danette Weinfeld  . Number of children: 0  . Years of education: BS in education  . Highest education level: Not on file  Occupational History  . Occupation:  Retired Education officer, museum  Tobacco Use  . Smoking status: Former Smoker    Types: Cigarettes    Quit date: 04/13/1997    Years since quitting: 23.0  . Smokeless tobacco: Never Used  Vaping Use  . Vaping Use: Never used  Substance and Sexual Activity  . Alcohol use: Yes    Alcohol/week: 1.0 standard drink    Types: 1 Glasses of wine per week    Comment: 2-3 glasses of wine per month  . Drug use: No  . Sexual activity: Not Currently  Other Topics Concern  . Not on file  Social History Narrative   Lives in [place] with spouse.  No children.   Retired first [employer] for over 30 years ([place] for 10 years and then in [place] for over 20 years).   Left-handed   Social Determinants of Health   Financial Resource Strain: Low Risk   . Difficulty of Paying Living Expenses: Not hard at all  Food Insecurity: No Food Insecurity  . Worried About Charity fundraiser in the Last Year: Never true  . Ran Out of Food in the Last Year: Never true  Transportation Needs: No Transportation Needs  . Lack of Transportation  (Medical): No  . Lack of Transportation (Non-Medical): No  Physical Activity:   . Days of Exercise per Week:   . Minutes of Exercise per Session:   Stress: No Stress Concern Present  . Feeling of Stress : Only a little  Social Connections:   . Frequency of Communication with Friends and Family:   . Frequency of Social Gatherings with Friends and Family:   . Attends Religious Services:   . Active Member of Clubs or Organizations:   . Attends Archivist Meetings:   [name] Marital Status:     Tobacco Counseling Counseling given: Not Answered   Clinical Intake:  Pre-visit preparation completed: Yes        Diabetes: Yes (Followed by pcp)  How often do you need to have someone help you when you read instructions, pamphlets, or other written materials from your doctor or pharmacy?: 1 - Never    Interpreter Needed?: No      Activities of Daily Living In your present state of health, do you have any difficulty performing the following activities: 05/03/2020  Hearing? N  Vision? N  Difficulty concentrating or making decisions? N  Walking or climbing stairs? N  Dressing or bathing? N  Doing errands, shopping? N  Preparing Food and eating ? N  Using the Toilet? N  In the past six months, have you accidently leaked urine? N  Do you have problems with loss of bowel control? N  Managing your Medications? N  Managing your Finances? N  Housekeeping or managing your Housekeeping? N  Some recent data might be hidden    Patient Care Team: Leone Haven, MD as PCP - General (Family Medicine) Sherren Mocha, MD as PCP - Cardiology (Cardiology)  Indicate any recent Medical Services you may have received from other than [facility] providers in the past year (date may be approximate).  Assessment:   This is a routine wellness examination for [place].  I connected with Magdalen today by telephone and verified that I am speaking with the correct person using two  identifiers. Location patient: home Location provider: work Persons participating in the virtual visit: patient, Marine scientist.    I discussed the limitations, risks, security and privacy concerns of performing an evaluation and management service by telephone and the availability of in person appointments. The patient expressed understanding and verbally consented to this telephonic visit.    Interactive audio and video telecommunications were attempted between this provider and patient, however failed, due to patient having technical difficulties OR patient did not have access to video capability.  We continued and completed visit with audio only.  Some vital signs may be absent or patient reported.   Hearing/Vision screen  Hearing Screening   _0  _1  _2  _3  _4  _5  _6  _7  _8   Right ear:           Left ear:           Comments: Patient is able to hear conversational tones without difficulty.  No issues reported.  Vision Screening Comments: Followed by [facility] Wears reader lenses Glaucoma Visual acuity not assessed, virtual visit.  They have seen their ophthalmologist in the last 12 months.    Dietary issues and exercise activities discussed: Healthy low carb, lean meat diet Good water intake Current Exercise Habits: The patient does not participate in regular exercise at present  Goals      Patient Stated   .  Increase physical activity (pt-stated)      I would like to use the treadmill more starting 3 days weekly, 15 minutes I would like to eat better      Depression Screen PHQ 2/9 Scores 05/03/2020 03/01/2020 10/16/2019 07/10/2019 12/03/2018 06/03/2017 01/03/2017  PHQ - 2 Score 0 0 0 6 - 4 0  PHQ- 9 Score - - - - - 11 -  Exception Documentation - - - - Other- indicate reason in comment box - -  Not completed - - - - pt is already seeing a psychiatrist.  - -    Fall Risk Fall Risk  05/03/2020 03/01/2020 10/16/2019 07/10/2019 12/03/2018  Falls  in the past year? 0 0 0 1 0  Number falls in past yr: 0 0 0 0 0  Injury with Fall? - - - 0 0  Follow up Falls evaluation completed Falls evaluation completed Falls evaluation completed Falls evaluation completed -   Handrails in use when climbing stairs? Yes  Home free of loose throw rugs in walkways, pet beds, electrical cords, etc? Yes  Adequate lighting in your home to reduce risk of falls? Yes   ASSISTIVE DEVICES UTILIZED TO PREVENT FALLS:  Life alert? No  Use of a cane, walker or w/c? No  Grab bars in the bathroom? No  Shower chair or bench in shower? No  Elevated toilet seat or a handicapped toilet? No   TIMED UP AND GO:  Was the test performed? No . Virtual visit  Cognitive Function:     6CIT Screen 05/03/2020  What Year? 0 points  What month? 0 points  Months in reverse 0 points  Repeat phrase 0 points    Immunizations Immunization History  Administered Date(s) Administered  . Influenza Split 10/03/2005, 09/24/2008, 09/09/2009, 09/15/2010, 08/14/2011  . Influenza, High Dose Seasonal PF 10/15/2018  . Influenza,inj,Quad PF,6+ Mos 07/28/2015  . Influenza-Unspecified 10/04/2016, 10/07/2017, 10/15/2018  . PFIZER SARS-COV-2 Vaccination 12/11/2019,  01/01/2020  . Pneumococcal Polysaccharide-23 03/19/2016   Tdap- Due. Call insurance company to determine out of pocket expense. Advised may also receive vaccine at local pharmacy or Health Dept. Educational information mailed to patient. Bring updated immunization record when completed.   Pnuemococcal vaccine- Due. Advised may receive vaccine at clinic, local pharmacy or Health Dept. Educational information mailed to patient. Bring updated immunization record when completed.   Zostavax/Shingrix- Due. Call insurance company to determine out of pocket expense. Advised may also receive vaccine at local pharmacy or Health Dept. Educational information mailed to patient. Bring updated immunization record when completed.   Health  Maintenance Health Maintenance  Topic Date Due  . Hepatitis C Screening  Never done  . TETANUS/TDAP  Never done  . PNA vac Low Risk Adult (2 of 2 - PCV13) 03/19/2017  . INFLUENZA VACCINE  06/05/2020  . HEMOGLOBIN A1C  10/01/2020  . FOOT EXAM  03/01/2021  . OPHTHALMOLOGY EXAM  04/27/2021  . COLONOSCOPY  03/29/2025  . DEXA SCAN  Completed  . COVID-19 Vaccine  Completed  . MAMMOGRAM  Discontinued   Hepatitis C Screening: deferred. Educational information mailed to patient.   Dental Screening: Recommended annual dental exams for proper oral hygiene.  Community Resource Referral / Chronic Care Management: CRR required this visit?  No  CCM required this visit?  No     Plan:    Keep all routine maintenance appointments.   Follow up  06/01/20 @ 10:30  I have personally reviewed and noted the following in the patient's chart:   . Medical and social history . Use of alcohol, tobacco or illicit drugs  . Current medications and supplements . Functional ability and status . Nutritional status . Physical activity . Advanced directives . List of other physicians . Hospitalizations, surgeries, and ER visits in previous 12 months . Vitals . Screenings to include cognitive, depression, and falls . Referrals and appointments  In addition, I have reviewed and discussed with patient certain preventive protocols, quality metrics, and best practice recommendations. A written personalized care plan for preventive services as well as general preventive health recommendations were provided to patient via mychart. Immunization educational information mailed via mail.     Varney Biles, LPN   07/26/1941

## 2020-05-03 NOTE — Progress Notes (Signed)
I have reviewed the above note and agree.  Patrica Mendell, M.D.  

## 2020-05-03 NOTE — Patient Instructions (Signed)
Medication Instructions:  Your physician has recommended you make the following change in your medication:   STOP taking Olmesartan (Benicar) START TOMORROW taking Entresto 24-26mg  1 twice daily  *If you need a refill on your cardiac medications before your next appointment, please call your pharmacy*   Lab Work: None  If you have labs (blood work) drawn today and your tests are completely normal, you will receive your results only by: [name] MyChart Message (if you have MyChart) OR . A paper copy in the mail If you have any lab test that is abnormal or we need to change your treatment, we will call you to review the results.   Testing/Procedures: Your physician has requested that you have a lexiscan myoview. For further information please visit [email or web address]. Please follow instruction sheet, as given.    Follow-Up: At [facility], you and your health needs are our priority.  As part of our continuing mission to provide you with exceptional heart care, we have created designated Provider Care Teams.  These Care Teams include your primary Cardiologist (physician) and Advanced Practice Providers (APPs -  Physician Assistants and Nurse Practitioners) who all work together to provide you with the care you need, when you need it.  We recommend signing up for the patient portal called "MyChart".  Sign up information is provided on this After Visit Summary.  MyChart is used to connect with patients for Virtual Visits (Telemedicine).  Patients are able to view lab/test results, encounter notes, upcoming appointments, etc.  Non-urgent messages can be sent to your provider as well.   To learn more about what you can do with MyChart, go to [email or web address].    Your next appointment:   06/10/2020 @ 10:45 with Richardson Dopp, PA   Other Instructions None

## 2020-05-06 ENCOUNTER — Telehealth: Payer: Self-pay | Admitting: Cardiovascular Disease

## 2020-05-06 NOTE — Telephone Encounter (Signed)
**Note De-Identified  Obfuscation** Per [facility] the cost to the pt for Melanie Ray is $490/30 day supply and that a PA is not required as the pts insurance is paying on it.  I called the pt and she is unsure if she is in the donut hole or if she has a deductible. She states that she is calling her insurance provider now and will call [employer] pt asst after that to see if she is eligible for their pt asst program. I did give her their number and asked that she request that they mail her an application.  Also, she states that she has 10 Olmesartan tablets left on hand and has not started taking Entresto yet. I have advised her to continue to take Olmesartan until we find a solution to her Melanie Ray cost.   She verbalized understanding and is in agreement with plan.

## 2020-05-06 NOTE — Telephone Encounter (Signed)
Pt c/o medication issue:  1. Name of Medication: sacubitril-valsartan (ENTRESTO) 24-26 MG  2. How are you currently taking this medication (dosage and times per day)? Has not started yet   3. Are you having a reaction (difficulty breathing--STAT)? No   4. What is your medication issue? Rainee states when she went to the pharmacy to use the coupon given for a discount on this medication they stated it had already been used and could only be accepted once. Dela is requesting another coupon so she can get this medication. Please advise.

## 2020-05-06 NOTE — Telephone Encounter (Signed)
I checked with [facility] and patient has previously used [name] card.  This card can only be used once in a lifetime per pharmacy.  Pharmacy reports after insurance pays their part the cost to patient will be $490 for a 30 day supply.  I spoke with patient and told her pharmacy was unable to use card for [facility] as it had been previously used.  Patient reports she has not taken Entresto in the past.  She is unable to afford $490 per month. Will forward to Patient Care Advocate to assist

## 2020-05-12 ENCOUNTER — Telehealth (HOSPITAL_COMMUNITY): Payer: Self-pay | Admitting: *Deleted

## 2020-05-12 ENCOUNTER — Telehealth: Payer: Self-pay | Admitting: Cardiovascular Disease

## 2020-05-12 NOTE — Telephone Encounter (Signed)
Patient returning Sharon's call regarding her stress test coming up. Transferred patient to [employer].

## 2020-05-12 NOTE — Telephone Encounter (Signed)
Left message on voicemail per DPR in reference to upcoming appointment scheduled on 05/16/20 at 10:45 with detailed instructions given per Myocardial Perfusion Study Information Sheet for the test. LM to arrive 15 minutes early, and that it is imperative to arrive on time for appointment to keep from having the test rescheduled. If you need to cancel or reschedule your appointment, please call the office within 24 hours of your appointment. Failure to do so may result in a cancellation of your appointment, and a $50 no show fee. Phone number given for call back for any questions.

## 2020-05-16 ENCOUNTER — Ambulatory Visit (HOSPITAL_COMMUNITY): Payer: Medicare Other | Attending: Cardiology

## 2020-05-16 ENCOUNTER — Encounter (HOSPITAL_COMMUNITY): Payer: Self-pay | Admitting: *Deleted

## 2020-05-16 ENCOUNTER — Other Ambulatory Visit: Payer: Self-pay

## 2020-05-16 DIAGNOSIS — R0602 Shortness of breath: Secondary | ICD-10-CM | POA: Insufficient documentation

## 2020-05-16 DIAGNOSIS — I5022 Chronic systolic (congestive) heart failure: Secondary | ICD-10-CM | POA: Diagnosis not present

## 2020-05-16 DIAGNOSIS — I2510 Atherosclerotic heart disease of native coronary artery without angina pectoris: Secondary | ICD-10-CM | POA: Diagnosis not present

## 2020-05-16 MED ORDER — TECHNETIUM TC 99M TETROFOSMIN IV KIT
30.9000 | PACK | Freq: Once | INTRAVENOUS | Status: AC | PRN
  Administered 2020-05-16: 30.9 via INTRAVENOUS
  Filled 2020-05-16: qty 31

## 2020-05-17 ENCOUNTER — Ambulatory Visit (HOSPITAL_COMMUNITY): Payer: Medicare Other

## 2020-05-18 ENCOUNTER — Ambulatory Visit (HOSPITAL_COMMUNITY): Payer: Medicare Other | Attending: Cardiovascular Disease

## 2020-05-18 ENCOUNTER — Other Ambulatory Visit: Payer: Self-pay

## 2020-05-18 DIAGNOSIS — R0602 Shortness of breath: Secondary | ICD-10-CM | POA: Diagnosis not present

## 2020-05-18 DIAGNOSIS — I2510 Atherosclerotic heart disease of native coronary artery without angina pectoris: Secondary | ICD-10-CM | POA: Diagnosis not present

## 2020-05-18 DIAGNOSIS — I5022 Chronic systolic (congestive) heart failure: Secondary | ICD-10-CM | POA: Diagnosis not present

## 2020-05-18 LAB — MYOCARDIAL PERFUSION IMAGING
LV dias vol: 175 mL (ref 46–106)
LV sys vol: 140 mL
Peak HR: 107 {beats}/min
Rest HR: 83 {beats}/min
SDS: 3
SRS: 1
SSS: 4
TID: 1.03

## 2020-05-18 MED ORDER — TECHNETIUM TC 99M TETROFOSMIN IV KIT
32.7000 | PACK | Freq: Once | INTRAVENOUS | Status: AC | PRN
  Administered 2020-05-18: 32.7 via INTRAVENOUS
  Filled 2020-05-18: qty 33

## 2020-05-18 MED ORDER — REGADENOSON 0.4 MG/5ML IV SOLN
0.4000 mg | Freq: Once | INTRAVENOUS | Status: AC
  Administered 2020-05-18: 0.4 mg via INTRAVENOUS

## 2020-05-19 ENCOUNTER — Encounter: Payer: Self-pay | Admitting: Physician Assistant

## 2020-05-31 ENCOUNTER — Telehealth: Payer: Self-pay | Admitting: Family Medicine

## 2020-05-31 ENCOUNTER — Other Ambulatory Visit: Payer: Self-pay | Admitting: Cardiovascular Disease

## 2020-05-31 NOTE — Telephone Encounter (Signed)
Left pt messages on the phone and on MyChart to reschedule appt.

## 2020-06-01 ENCOUNTER — Ambulatory Visit: Payer: Medicare Other | Admitting: Family Medicine

## 2020-06-03 ENCOUNTER — Encounter: Payer: Self-pay | Admitting: Family Medicine

## 2020-06-03 ENCOUNTER — Ambulatory Visit
Admission: RE | Admit: 2020-06-03 | Discharge: 2020-06-03 | Disposition: A | Discharge status: Home / Self Care | Payer: Medicare Other | Source: Ambulatory Visit | Attending: Family Medicine | Admitting: Family Medicine

## 2020-06-03 ENCOUNTER — Ambulatory Visit (INDEPENDENT_AMBULATORY_CARE_PROVIDER_SITE_OTHER): Payer: Medicare Other | Admitting: Family Medicine

## 2020-06-03 ENCOUNTER — Other Ambulatory Visit: Payer: Self-pay

## 2020-06-03 VITALS — Ht 63.0 in | Wt 170.6 lb

## 2020-06-03 DIAGNOSIS — R05 Cough: Secondary | ICD-10-CM | POA: Diagnosis not present

## 2020-06-03 DIAGNOSIS — J90 Pleural effusion, not elsewhere classified: Secondary | ICD-10-CM | POA: Diagnosis not present

## 2020-06-03 DIAGNOSIS — R0602 Shortness of breath: Secondary | ICD-10-CM | POA: Diagnosis not present

## 2020-06-03 DIAGNOSIS — R059 Cough, unspecified: Secondary | ICD-10-CM

## 2020-06-03 DIAGNOSIS — F419 Anxiety disorder, unspecified: Secondary | ICD-10-CM | POA: Diagnosis not present

## 2020-06-03 DIAGNOSIS — E119 Type 2 diabetes mellitus without complications: Secondary | ICD-10-CM

## 2020-06-03 IMAGING — CR DG CHEST 2V
1 series · 2 of 2 positions shown · non-contrast
Comparison: [DATE]

CLINICAL DATA: Cough, shortness of breath

EXAM:
CHEST - 2 VIEW

[Series 1: dg chest 2 view · 0.14mm/px · 2 of 2 slices shown]
[im 1/2]
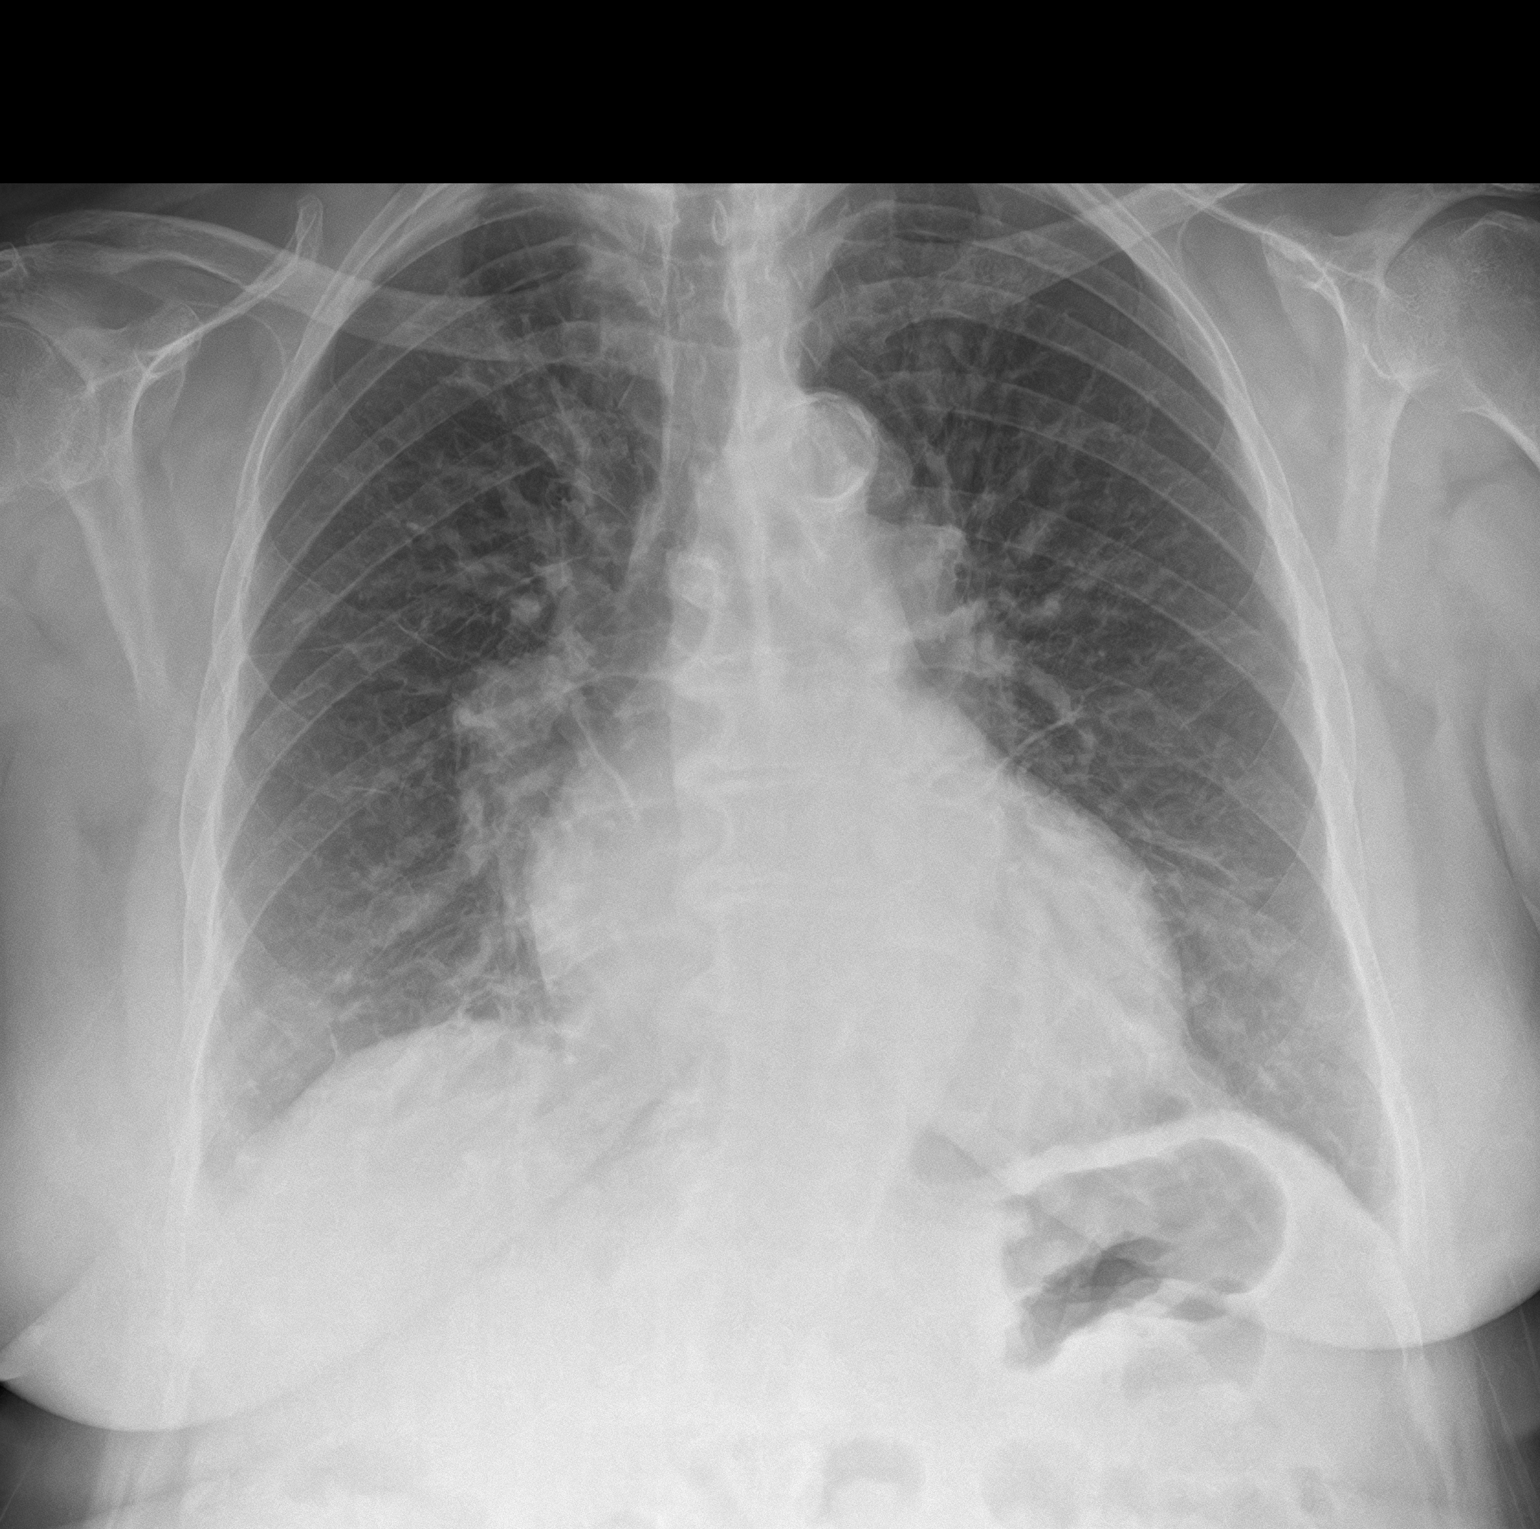
[im 2/2]
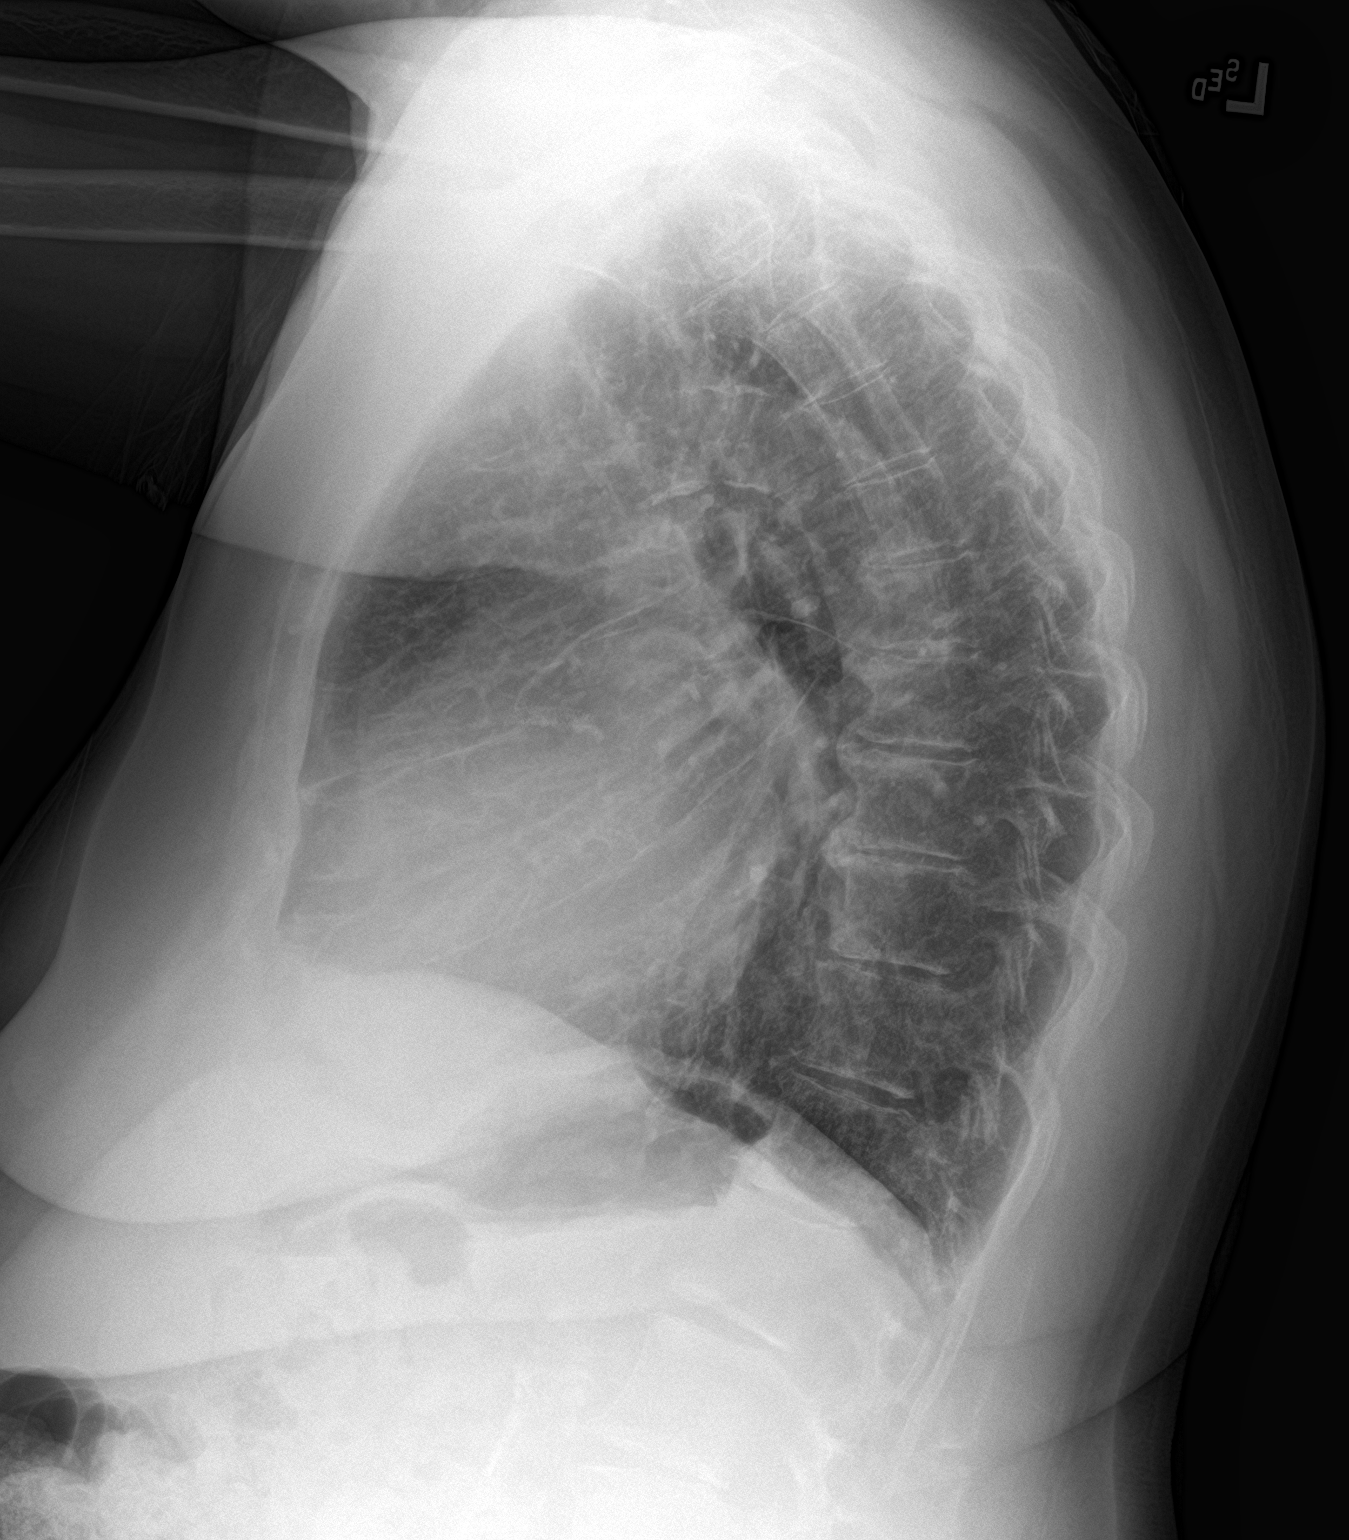

[2 of 2 positions shown; findings below may reference images not displayed]

FINDINGS: The heart size and mediastinal contours are stable. Atherosclerotic
calcification of the aortic knob. Mildly coarsened bibasilar
interstitial markings. No focal airspace consolidation, pleural
effusion, or pneumothorax. Degenerative changes within the thoracic
spine.
IMPRESSION: Mildly coarsened bibasilar interstitial markings, which may reflect
bronchitic type lung changes. No focal airspace consolidation.

## 2020-06-03 MED ORDER — BLOOD GLUCOSE MONITOR KIT: PACK | 0 refills | Status: DC

## 2020-06-03 MED ORDER — HYDROXYZINE HCL 10 MG PO TABS
10.0000 mg | ORAL_TABLET | Freq: Three times a day (TID) | ORAL | 0 refills | Status: DC | PRN
Start: 2020-06-03 — End: 2021-04-24

## 2020-06-03 NOTE — Assessment & Plan Note (Addendum)
Notes some anxiety associated with her cough and shortness of breath.  Discussed that Ativan would not be my first choice for acute antianxiety medication given risk of dependence and addiction.  Discussed that we could try an alternative medicine such as Atarax.  Advised to monitor for any drowsiness.  Of note she reports her psychiatrist cannot prescribe controlled substances.

## 2020-06-03 NOTE — Assessment & Plan Note (Signed)
I wonder if her sugar is dropping to cause her to feel dizzy.  We will send a glucometer and for her to use when she has symptoms and see if that reveals low blood sugars.  She will continue Metformin.

## 2020-06-03 NOTE — Progress Notes (Signed)
Virtual Visit via telephone Note  This visit type was conducted due to national recommendations for restrictions regarding the COVID-19 pandemic (e.g. social distancing).  This format is felt to be most appropriate for this patient at this time.  All issues noted in this document were discussed and addressed.  No physical exam was performed (except for noted visual exam findings with Video Visits).   I connected with Melanie Ray today at 10:00 AM EDT by a video enabled telemedicine application or telephone and verified that I am speaking with the correct person using two identifiers. Location patient: exam room - patient with cough and shortness of breath so in person visit could not be completed and patient did not want to go to the car for the visit Location provider: work Persons participating in the virtual visit: patient, provider  I discussed the limitations, risks, security and privacy concerns of performing an evaluation and management service by telephone and the availability of in person appointments. I also discussed with the patient that there may be a patient responsible charge related to this service. The patient expressed understanding and agreed to proceed.  Interactive audio and video telecommunications were attempted between this provider and patient, however failed, due to patient having technical difficulties OR patient did not have access to video capability.  We continued and completed visit with audio only.   Reason for visit: Same-day visit.  HPI: Shortness of breath/cough: Patient notes this started on 7/21.  She woke up in the middle the night with coughing.  She has had shortness of breath associated with this.  Most of her symptoms are at night and rarely has symptoms during the day.  No congestion.  No fevers.  She is vaccinated against COVID-19.  No known exposures though she does note her vet was very sick last week and has been out of the office.  No taste or  smell disturbances.  She notes she did take nitroglycerin which did help with her shortness of breath.  She has had no swelling or chest pain.  She does report some anxiety with this and requests Ativan.  Diabetes: Taking Metformin.  She is not checking sugars.  She does note occasionally feeling dizzy about twice a week if she does not eat.  She will eat and then her symptoms will improve.   ROS: See pertinent positives and negatives per HPI.  Past Medical History:  Diagnosis Date  . Anemia   . Anxiety   . Back pain   . CAD (coronary artery disease) 04/11/2016   S/p ant STEMI 5/17: LHC >> LAD proximal 80%, mid 80%, distal 50%, ostial D1 60%; LCx with LPDA lesion 30%; RCA Mild calcification with no significant stenosis in a medium caliber, nondominant RCA; LVEF is estimated at 45% with inferoapical and lateral wall akinesis >> PCI: PCI: 3.5 x 24 mm Promus DES to prox LAD, 2.5 x 12 mm Promus DES to mid LAD. // Myoview 7/21: EF 20, no ischemia; high risk   . Chest pain   . Chronic systolic CHF (congestive heart failure) ([place]) 03/21/2016   Echo 01/30/17: Diff HK, mild focal basal septal hypertrophy, EF 30-35, mild AI, MAC, mild MR // Echo 06/08/16: Mild focal basal septal hypertrophy, EF 25-30%, diff HK, ant-septal AK, Gr 1 DD, mild AI, MAC, mild MR, PASP 37 mmHg // Echo 03/18/16: EF 30-35%, ant-septal AK, Gr 1 DD, mild MR, severe LAE.  [name] Constipation   . Depression   . Diabetes mellitus type  2 in obese ([place])   . Dizziness   . Glaucoma   . Gout   . Heart disease   . Heartburn   . History of acute anterior wall MI 03/17/2016  . History of heart attack   . Hyperlipemia   . Hypertension   . Ischemic cardiomyopathy 10/02/2016   Refused ICD  . Joint pain   . Left bundle branch block   . Myocardial infarction ([place])   . Nausea   . Sleep apnea   . SOB (shortness of breath)   . Suicidal ideation 08/27/2018  . Swelling    feet or legs    Past Surgical History:  Procedure Laterality Date  .  APPENDECTOMY    . CARDIAC CATHETERIZATION N/A 03/17/2016   Procedure: Left Heart Cath and Coronary Angiography;  Surgeon: Sherren Mocha, MD;  Location: [facility];  Service: Cardiovascular;  Laterality: N/A;  . CARDIAC CATHETERIZATION N/A 03/17/2016   Procedure: Coronary Stent Intervention;  Surgeon: Sherren Mocha, MD;  Location: [facility];  Service: Cardiovascular;  Laterality: N/A;  . COLONOSCOPY WITH PROPOFOL N/A 08/13/2017   Procedure: COLONOSCOPY WITH PROPOFOL;  Surgeon: Jonathon Bellows, MD;  Location: [facility] ENDOSCOPY;  Service: Gastroenterology;  Laterality: N/A;  . COLONOSCOPY WITH PROPOFOL N/A 03/29/2020   Procedure: COLONOSCOPY WITH PROPOFOL;  Surgeon: Lucilla Lame, MD;  Location: [facility] ENDOSCOPY;  Service: Endoscopy;  Laterality: N/A;  . SMALL BOWEL REPAIR    . TONSILLECTOMY    . UTERINE FIBROID SURGERY      Family History  Problem Relation Age of Onset  . Anxiety disorder Mother   . Paranoid behavior Mother   . Hypertension Mother   . Heart failure Mother   . Stroke Mother   . Dementia Mother   . High Cholesterol Mother   . Diabetes Mother   . Hyperlipidemia Mother   . Heart disease Mother   . Depression Mother   . Hypertension Father   . High Cholesterol Father   . Mood Disorder Sister   . Stroke Sister   . Anxiety disorder Maternal Aunt   . Drug abuse Cousin   . Tuberculosis Paternal Grandfather     SOCIAL HX: Former smoker   Current Outpatient Medications:  .  atorvastatin (LIPITOR) 80 MG tablet, Take 1 tablet by mouth once daily (Patient taking differently: Take 80 mg by mouth daily. ), Disp: 90 tablet, Rfl: 3 .  blood glucose meter kit and supplies KIT, Dispense based on patient and insurance preference.  Check glucose once daily fasting in the morning. (FOR ICD 10 E11.9)., Disp: 1 each, Rfl: 0 .  carvedilol (COREG) 12.5 MG tablet, TAKE 1 TABLET BY MOUTH TWICE DAILY WITH MEALS (Patient taking differently: Take 12.5 mg by mouth 2 (two) times daily  with a meal. ), Disp: 180 tablet, Rfl: 3 .  clopidogrel (PLAVIX) 75 MG tablet, Take 1 tablet (75 mg total) by mouth daily., Disp: 90 tablet, Rfl: 1 .  Cyanocobalamin (VITAMIN B-12 PO), Take 1 capsule by mouth daily., Disp: , Rfl:  .  dorzolamide-timolol (COSOPT) 22.3-6.8 MG/ML ophthalmic solution, Place 1 drop into both eyes 2 (two) times daily. , Disp: , Rfl:  .  isosorbide mononitrate (IMDUR) 30 MG 24 hr tablet, Take 1 tablet (30 mg total) by mouth daily., Disp: 90 tablet, Rfl: 1 .  latanoprost (XALATAN) 0.005 % ophthalmic solution, Place 1 drop into both eyes at bedtime., Disp: , Rfl:  .  LATUDA 60 MG TABS, Take 60 mg by mouth  daily with breakfast. , Disp: , Rfl:  .  metFORMIN (GLUCOPHAGE) 500 MG tablet, TAKE 1 TABLET BY MOUTH TWICE DAILY WITH  A  MEAL (Patient taking differently: Take 500 mg by mouth 2 (two) times daily with a meal. ), Disp: 180 tablet, Rfl: 0 .  Multiple Vitamin (MULTI-VITAMIN DAILY) TABS, Take 1 tablet by mouth daily., Disp: , Rfl:  .  nitroGLYCERIN (NITROSTAT) 0.4 MG SL tablet, DISSOLVE ONE TABLET UNDER THE TONGUE EVERY 5 MINUTES AS NEEDED FOR CHEST PAIN.  DO NOT EXCEED A TOTAL OF 3 DOSES IN 15 MINUTES, Disp: 25 tablet, Rfl: 10 .  OLANZapine (ZYPREXA) 5 MG tablet, Take 1 tablet (5 mg total) by mouth at bedtime. For anxiety and sleep, Disp: 30 tablet, Rfl: 1 .  sacubitril-valsartan (ENTRESTO) 24-26 MG, Take 1 tablet by mouth 2 (two) times daily., Disp: 60 tablet, Rfl: 1 .  spironolactone (ALDACTONE) 25 MG tablet, Take 1/2 (one-half) tablet by mouth once daily (Patient taking differently: Take 12.5 mg by mouth daily. TAKE 1/2 (ONE-HALF) TABLET BY MOUTH ONCE DAILY), Disp: 45 tablet, Rfl: 3 .  traZODone (DESYREL) 100 MG tablet, Take 100 mg by mouth at bedtime as needed for sleep., Disp: , Rfl:  .  venlafaxine XR (EFFEXOR-XR) 150 MG 24 hr capsule, TAKE 2 CAPSULES BY MOUTH ONCE DAILY (NEEDS  APPOINTMENT  FOR  FURTHER  REFILLS) (Patient taking differently: Take 300 mg by mouth daily  with breakfast. ), Disp: 60 capsule, Rfl: 0 .  furosemide (LASIX) 40 MG tablet, Take 1 tablet (40 mg total) by mouth as directed. 40 mg on Mon, Wed and Fri's; all other days 20 mg, Disp: 65 tablet, Rfl: 3 .  hydrOXYzine (ATARAX/VISTARIL) 10 MG tablet, Take 1 tablet (10 mg total) by mouth 3 (three) times daily as needed., Disp: 30 tablet, Rfl: 0  EXAM: This is a telephone visit and thus no physical exam was completed.   ASSESSMENT AND PLAN:  Discussed the following assessment and plan:  Cough Recent onset cough with associated shortness of breath.  She has not had any significant weight gain or edema to indicate volume overload.  This could represent a viral illness or COVID-19.  I encouraged her to get a COVID-19 test and to quarantine at home until that resulted.  Given persistent symptoms we will have her do a chest x-ray as well to evaluate for underlying pneumonia.  [place] [facility].  Anxiety Notes some anxiety associated with her cough and shortness of breath.  Discussed that Ativan would not be my first choice for acute antianxiety medication given risk of dependence and addiction.  Discussed that we could try an alternative medicine such as Atarax.  Advised to monitor for any drowsiness.  Of note she reports her psychiatrist cannot prescribe controlled substances.  DM type 2 (diabetes mellitus, type 2) ([place]) I wonder if her sugar is dropping to cause her to feel dizzy.  We will send a glucometer and for her to use when she has symptoms and see if that reveals low blood sugars.  She will continue Metformin.   Orders Placed This Encounter  Procedures  . DG Chest 2 View    Standing Status:   Future    Standing Expiration Date:   06/03/2021    Order Specific Question:   Reason for Exam (SYMPTOM  OR DIAGNOSIS REQUIRED)    Answer:   cough, shortness of breath for 10 days    Order Specific Question:  Preferred imaging location?    Answer:   [facility]     Order Specific Question:   Radiology Contrast Protocol - do NOT remove file path    Answer:   [email or web address]    Meds ordered this encounter  Medications  . blood glucose meter kit and supplies KIT    Sig: Dispense based on patient and insurance preference.  Check glucose once daily fasting in the morning. (FOR ICD 10 E11.9).    Dispense:  1 each    Refill:  0    Order Specific Question:   Number of strips    Answer:   100    Order Specific Question:   Number of lancets    Answer:   100  . hydrOXYzine (ATARAX/VISTARIL) 10 MG tablet    Sig: Take 1 tablet (10 mg total) by mouth 3 (three) times daily as needed.    Dispense:  30 tablet    Refill:  0     I discussed the assessment and treatment plan with the patient. The patient was provided an opportunity to ask questions and all were answered. The patient agreed with the plan and demonstrated an understanding of the instructions.   The patient was advised to call back or seek an in-person evaluation if the symptoms worsen or if the condition fails to improve as anticipated.  I provided 11 minutes of non-face-to-face time during this encounter.   Tommi Rumps, MD

## 2020-06-03 NOTE — Assessment & Plan Note (Signed)
Recent onset cough with associated shortness of breath.  She has not had any significant weight gain or edema to indicate volume overload.  This could represent a viral illness or COVID-19.  I encouraged her to get a COVID-19 test and to quarantine at home until that resulted.  Given persistent symptoms we will have her do a chest x-ray as well to evaluate for underlying pneumonia.  [facility] [place] [facility]

## 2020-06-04 DIAGNOSIS — J40 Bronchitis, not specified as acute or chronic: Secondary | ICD-10-CM | POA: Diagnosis not present

## 2020-06-04 DIAGNOSIS — R05 Cough: Secondary | ICD-10-CM | POA: Diagnosis not present

## 2020-06-06 ENCOUNTER — Telehealth: Payer: Self-pay | Admitting: Family Medicine

## 2020-06-06 NOTE — Telephone Encounter (Signed)
Patient stated she did pick them up and is currently taking the medication.

## 2020-06-06 NOTE — Telephone Encounter (Signed)
She was seen at urgent care and they sent in a z-pack for her. Did she pick that up and start it?

## 2020-06-06 NOTE — Telephone Encounter (Signed)
Pt said she tested negative for covid and she needs something called in for her.

## 2020-06-06 NOTE — Telephone Encounter (Signed)
Pt said she tested negative for covid and she needs something called in for her.  Charmel Pronovost,cma

## 2020-06-07 NOTE — Telephone Encounter (Signed)
That patient has been informed. [name]

## 2020-06-07 NOTE — Telephone Encounter (Signed)
Noted. She needs to finish that antibiotic and see how she does.

## 2020-06-09 ENCOUNTER — Encounter: Payer: Self-pay | Admitting: Physician Assistant

## 2020-06-09 NOTE — Progress Notes (Signed)
Cardiology Office Note:    Date:  06/10/2020   ID:  Melanie, Ray Nov 07, 1969, MRN [id number]  PCP:  Melanie Haven, MD  Cardiologist:  Melanie Mocha, MD   Electrophysiologist:  None   Referring MD: Melanie Haven, MD   Chief Complaint: Follow-up (CHF)    Patient Profile:    Melanie Ray is a 69 y.o. female with:   Coronary artery disease  ? S/p Anterior STEMI 5/17 >> PCI: DES x 2 to LAD  Chronic systolic CHF ? Ischemic CM ? EF 30-35 at time of MI >> DC on [facility] >> Pt DC'd at FU visit ? Echocardiogram 8/17: EF 25-30 ? Echocardiogram 3/18: EF 30-35 ? Echocardiogram 8/19: EF 30-35 ? EP eval (J Melanie Ray) >> pt declined CRT-D ? Intol of Entresto ? Echocardiogram 03/2020: EF < 20  Obsessive Compulsive d/o  Diabetes mellitus 2  Hypertension   Hyperlipidemia   LBBB  OSA  Hepatic steatosis   Hx of cerebellar CVA 03/2020  Bilateral vertebral artery stenosis   Prior CV studies: Myoview 7/21:  EF 20, no ischemia; high risk   Echocardiogram 03/31/20 (w bubble study) EF < 20, global HK, mod LVH, mod LAE, mod MR, RVSP 41.4, bubble study neg  Head and neck CTA 03/31/20 1. CT head negative for acute abnormality. Small acute infarct in the left thalamus seen on MRI last night is not visualized by CT. No intracranial hemorrhage 2. Atherosclerotic aortic arch and proximal great vessels. Atherosclerotic disease in the carotid bifurcation bilaterally without significant stenosis. 3. Moderate to severe stenosis proximal right vertebral artery which is then patent to the basilar. Severe stenosis proximal left vertebral artery which is diffusely diseased and small but does contribute to the basilar. 4. Occlusion left P3 segment.  Moderate stenosis left P2 segment 5. Anterior circulation widely patent. 6. Multiple thyroid nodules. The largest nodule on the left is 20 mm. Thyroid ultrasound recommended. (Ref: J Am Coll Radiol. 2015 Feb;12(2):  143-50).  Carotid US 03/31/20 R 50-69 (no sig dz on CTA)  Echo 06/30/18 Mild conc LVH, EF 30-35, ant-sept/inf/inf-sept HK, Gr 1 DD, mild to mod AI, MAC, mobile density on MV unchanged, mild MR, mild TR, PASP 39  Echo 01/30/17 Diff HK, mild focal basal septal hypertrophy, EF 30-35, mild AI, MAC, mild MR  Echo 06/08/16 Mild focal basal septal hypertrophy, EF 25-30%, diff HK, ant-septal AK, Gr 1 DD, mild AI, MAC, mild MR, PASP 37 mmHg  Echo 03/18/16 EF 30-35%, ant-septal AK, Gr 1 DD, mild MR, severe LAE.  LHC 03/17/16 LAD proximal 80%, mid 80%, distal 50%, ostial D1 60%  LCx with LPDA lesion, 30%  RCA Mild calcification with no significant stenosis in a medium caliber, nondominant RCA LVEF is estimated at 45% with inferoapical and lateral wall akinesis PCI: PCI: 3.5 x 24 mm Promus DES to prox LAD, 2.5 x 12 mm Promus DES to mid LAD.  History of Present Illness:    [name] was last seen in 04/2020.  She had been admitted to the hospital in May with a stroke.  Echocardiogram during that admission demonstrated worsening LV function with an EF of <20.  At her last visit, I switched her ARB to [facility].  She had some arm symptoms and I set her up for a Myoview.  This did not demonstrate any ischemia.  I have reviewed her case with Dr. Burt Ray.  She would likely benefit from CRT therapy.  She returns for follow-up to further titrate  medications for CHF and discuss possible referral to EP for consideration of CRT therapy.  She returns for follow-up.  She is here alone.  She has had bronchitis over the past week.  She had an x-ray that was fairly unremarkable.  She had a Covid test that was negative.  She has been placed on inhalers and antibiotics.  She continues to have a significant cough at night.  She also notes shortness of breath with activity.  She notes orthopnea.  She really has not had significant weight gain.  She has not had syncope or chest discomfort.  Past Medical History:    Diagnosis Date  . Anemia   . Anxiety   . Back pain   . CAD (coronary artery disease) 04/11/2016   S/p ant STEMI 5/17: LHC >> LAD proximal 80%, mid 80%, distal 50%, ostial D1 60%; LCx with LPDA lesion 30%; RCA Mild calcification with no significant stenosis in a medium caliber, nondominant RCA; LVEF is estimated at 45% with inferoapical and lateral wall akinesis >> PCI: PCI: 3.5 x 24 mm Promus DES to prox LAD, 2.5 x 12 mm Promus DES to mid LAD. // Myoview 7/21: EF 20, no ischemia; high risk   . Chest pain   . Chronic systolic CHF (congestive heart failure) (Melanie Ray) 03/21/2016   Echo 01/30/17: Diff HK, mild focal basal septal hypertrophy, EF 30-35, mild AI, MAC, mild MR // Echo 06/08/16: Mild focal basal septal hypertrophy, EF 25-30%, diff HK, ant-septal AK, Gr 1 DD, mild AI, MAC, mild MR, PASP 37 mmHg // Echo 03/18/16: EF 30-35%, ant-septal AK, Gr 1 DD, mild MR, severe LAE.  [name] Constipation   . Depression   . Diabetes mellitus type 2 in obese (Melanie Ray)   . Dizziness   . Glaucoma   . Gout   . Heart disease   . Heartburn   . History of acute anterior wall MI 03/17/2016  . History of heart attack   . Hyperlipemia   . Hypertension   . Ischemic cardiomyopathy 10/02/2016   Refused ICD  . Joint pain   . Left bundle branch block   . Myocardial infarction (Melanie Ray)   . Nausea   . Sleep apnea   . SOB (shortness of breath)   . Suicidal ideation 08/27/2018  . Swelling    feet or legs    Current Medications: Current Meds  Medication Sig  . albuterol (VENTOLIN HFA) 108 (90 Base) MCG/ACT inhaler Inhale 2 puffs into the lungs every 6 (six) hours as needed for shortness of breath.   [name] atorvastatin (LIPITOR) 80 MG tablet Take 1 tablet by mouth once daily  . blood glucose meter kit and supplies KIT Dispense based on patient and insurance preference.  Check glucose once daily fasting in the morning. (FOR ICD 10 E11.9).  . carvedilol (COREG) 12.5 MG tablet TAKE 1 TABLET BY MOUTH TWICE DAILY WITH MEALS  .  clopidogrel (PLAVIX) 75 MG tablet Take 1 tablet (75 mg total) by mouth daily.  . Cyanocobalamin (VITAMIN B-12 PO) Take 1 capsule by mouth daily.  . dorzolamide-timolol (COSOPT) 22.3-6.8 MG/ML ophthalmic solution Place 1 drop into both eyes 2 (two) times daily.   . furosemide (LASIX) 40 MG tablet Take 1 tablet (40 mg total) by mouth as directed. 40 mg on Mon, Wed and Fri's; all other days 20 mg  . hydrOXYzine (ATARAX/VISTARIL) 10 MG tablet Take 1 tablet (10 mg total) by mouth 3 (three) times daily as needed.  . isosorbide mononitrate (IMDUR) 30  MG 24 hr tablet Take 1 tablet (30 mg total) by mouth daily.  [name] latanoprost (XALATAN) 0.005 % ophthalmic solution Place 1 drop into both eyes at bedtime.  [name] LATUDA 60 MG TABS Take 60 mg by mouth daily with breakfast.   . metFORMIN (GLUCOPHAGE) 500 MG tablet TAKE 1 TABLET BY MOUTH TWICE DAILY WITH  A  MEAL  . Multiple Vitamin (MULTI-VITAMIN DAILY) TABS Take 1 tablet by mouth daily.  . nitroGLYCERIN (NITROSTAT) 0.4 MG SL tablet DISSOLVE ONE TABLET UNDER THE TONGUE EVERY 5 MINUTES AS NEEDED FOR CHEST PAIN.  DO NOT EXCEED A TOTAL OF 3 DOSES IN 15 MINUTES  . OLANZapine (ZYPREXA) 5 MG tablet Take 1 tablet (5 mg total) by mouth at bedtime. For anxiety and sleep  . spironolactone (ALDACTONE) 25 MG tablet Take 1/2 (one-half) tablet by mouth once daily  . traZODone (DESYREL) 100 MG tablet Take 100 mg by mouth at bedtime as needed for sleep.  [name] venlafaxine XR (EFFEXOR-XR) 150 MG 24 hr capsule TAKE 2 CAPSULES BY MOUTH ONCE DAILY (NEEDS  APPOINTMENT  FOR  FURTHER  REFILLS)  . [DISCONTINUED] sacubitril-valsartan (ENTRESTO) 24-26 MG Take 1 tablet by mouth 2 (two) times daily.     Allergies:   Tetracycline and Meperidine   Social History   Tobacco Use  . Smoking status: Former Smoker    Types: Cigarettes    Quit date: 04/13/1997    Years since quitting: 23.1  . Smokeless tobacco: Never Used  Vaping Use  . Vaping Use: Never used  Substance Use Topics  . Alcohol use:  Yes    Alcohol/week: 1.0 standard drink    Types: 1 Glasses of wine per week    Comment: 2-3 glasses of wine per month  . Drug use: No     Family Hx: The patient's family history includes Anxiety disorder in her maternal aunt and mother; Dementia in her mother; Depression in her mother; Diabetes in her mother; Drug abuse in her cousin; Heart disease in her mother; Heart failure in her mother; High Cholesterol in her father and mother; Hyperlipidemia in her mother; Hypertension in her father and mother; Mood Disorder in her sister; Paranoid behavior in her mother; Stroke in her mother and sister; Tuberculosis in her paternal grandfather.  Review of Systems  Respiratory: Positive for cough.       EKGs/Labs/Other Test Reviewed:    EKG:  EKG is not ordered today.  The ekg ordered today demonstrates n/a  Recent Labs: 04/06/2020: Hemoglobin 12.2; Platelets 335.0 04/18/2020: ALT 19; BUN 16; Creatinine, Ser 0.91; Potassium 4.2; Sodium 140   Recent Lipid Panel Lab Results  Component Value Date/Time   CHOL 112 04/18/2020 08:15 AM   TRIG 65 04/18/2020 08:15 AM   HDL 39 (L) 04/18/2020 08:15 AM   CHOLHDL 2.9 04/18/2020 08:15 AM   CHOLHDL 3.7 03/31/2020 05:14 AM   LDLCALC 59 04/18/2020 08:15 AM   LDLDIRECT 78.0 02/07/2018 09:28 AM    Physical Exam:    VS:  BP 122/80   Pulse 84   Ht _0  (1.6 m)   Wt 171 lb 9.6 oz (77.8 kg)   SpO2 98%   BMI 30.40 kg/m     Wt Readings from Last 3 Encounters:  06/10/20 171 lb 9.6 oz (77.8 kg)  06/03/20 170 lb 9.6 oz (77.4 kg)  05/16/20 168 lb (76.2 kg)     Constitutional:      Appearance: Healthy appearance. Not in distress.  Neck:  Thyroid: No thyromegaly.     Vascular: JVD normal.  Pulmonary:     Breath sounds: No wheezing.     Comments: Coarse breath sounds Cardiovascular:     Normal rate. Regular rhythm. Normal S1. Normal S2.     Murmurs: There is no murmur.  Edema:    Peripheral edema absent.  Abdominal:     Palpations: Abdomen is  soft.  Skin:    General: Skin is warm and dry.  Neurological:     Mental Status: Alert and oriented to person, place and time.     Cranial Nerves: Cranial nerves are intact.       ASSESSMENT & PLAN:    1. Acute on chronic systolic CHF (congestive heart failure) ([place]) 2. Left bundle branch block EF <20.  Ischemic cardiomyopathy.  NYHA III.  Her exam is not really suggestive of volume excess.  However, with her recent respiratory illness, I believe that she is likely somewhat volume overloaded.  She has not really been taking furosemide.  Therefore, I will place her back on furosemide 40 mg daily x3 days.  She can then take 20 mg 3 times a week.  Get a follow-up BMET in 2 weeks.  I will see her back in 2 to 3 weeks.  She knows to contact us if her symptoms are not getting any better.  I have asked her to contact primary care to see if there is any cough suppressant she can be prescribed for nighttime.  I discussed CRT implantation with her.  She is not sure if she would like to proceed with this.  I provided her with some information today.  If she changes her mind, we can send her back to [name].  Of note, she could not get Entresto due to cost. She remains on Olmesartan.  I will see if we can arrange assistance for her.  3. Coronary artery disease involving native coronary artery of native heart without angina pectoris Hx of anterior STEMI in 5/17 tx with DES x 2 to the LAD.  Recent Myoview without ischemia.  She is not having anginal symptoms.  Continue atorvastatin, carvedilol, clopidogrel, isosorbide.  4. Essential hypertension The patient's blood pressure is controlled on her current regimen.  Continue current therapy.   5. Mixed hyperlipidemia LDL optimal on most recent lab work.  Continue current Rx.      Dispo:  Return in about 3 weeks (around 07/01/2020) for Close Follow Up, w/ Richardson Dopp, PA-C, in person.   Medication Adjustments/Labs and Tests Ordered: Current medicines  are reviewed at length with the patient today.  Concerns regarding medicines are outlined above.  Tests Ordered: No orders of the defined types were placed in this encounter.  Medication Changes: Meds ordered this encounter  Medications  . olmesartan (BENICAR) 20 MG tablet    Sig: Take 1 tablet (20 mg total) by mouth daily.    Dispense:  90 tablet    Refill:  1    Signed, Richardson Dopp, PA-C  06/10/2020 1:39 PM    [facility] [address], [place], [place]  [address] Phone: [phone]; Fax: [phone]

## 2020-06-10 ENCOUNTER — Encounter: Payer: Self-pay | Admitting: Physician Assistant

## 2020-06-10 ENCOUNTER — Ambulatory Visit (INDEPENDENT_AMBULATORY_CARE_PROVIDER_SITE_OTHER): Payer: Medicare Other | Admitting: Physician Assistant

## 2020-06-10 ENCOUNTER — Other Ambulatory Visit: Payer: Self-pay

## 2020-06-10 VITALS — BP 122/80 | HR 84 | Ht 63.0 in | Wt 171.6 lb

## 2020-06-10 DIAGNOSIS — I447 Left bundle-branch block, unspecified: Secondary | ICD-10-CM | POA: Diagnosis not present

## 2020-06-10 DIAGNOSIS — I10 Essential (primary) hypertension: Secondary | ICD-10-CM

## 2020-06-10 DIAGNOSIS — E782 Mixed hyperlipidemia: Secondary | ICD-10-CM | POA: Diagnosis not present

## 2020-06-10 DIAGNOSIS — I639 Cerebral infarction, unspecified: Secondary | ICD-10-CM

## 2020-06-10 DIAGNOSIS — I5023 Acute on chronic systolic (congestive) heart failure: Secondary | ICD-10-CM | POA: Diagnosis not present

## 2020-06-10 DIAGNOSIS — I2510 Atherosclerotic heart disease of native coronary artery without angina pectoris: Secondary | ICD-10-CM | POA: Diagnosis not present

## 2020-06-10 MED ORDER — OLMESARTAN MEDOXOMIL 20 MG PO TABS: 20.0000 mg | ORAL_TABLET | Freq: Every day | ORAL | 1 refills | Status: DC

## 2020-06-10 NOTE — Patient Instructions (Addendum)
Medication Instructions:  Your physician has recommended you make the following change in your medication:   1) Restart Lasix 40 mg, 1 tablet by mouth for 3 days, then take 20 mg (0.5 tablet) by mouth three times a week on Monday/Wednesday/Friday   *If you need a refill on your cardiac medications before your next appointment, please call your pharmacy*  Lab Work: None ordered today  Testing/Procedures: None ordered today  Follow-Up: On 07/01/20 at 11:45AM with Richardson Dopp, PA-C  Other Instructions   Biventricular Pacemaker Implantation A biventricular pacemaker implantation is a procedure to place (implant) a pacemaker near the heart. A pacemaker is a small, battery-powered device that helps control the heartbeat. If the heart beats irregularly or too slowly (bradycardia), the pacemaker will pace the heart so that it beats at a normal rate or a programmed rate. The parts of a biventricular pacemaker include:  The pulse generator. The pulse generator contains a small computer and a memory system that is programmed to keep the heart beating at a certain rate. The pulse generator also produces the electrical signal that triggers the heart to beat. This is implanted under the skin of the upper chest, near the collarbone.  Wires (leads). There may be two or three leads placed in the heart--one to the right atrium, one to the right ventricle, and one through the coronary sinus to reach the left ventricle of the heart. The leads are connected to the pulse generator. They transmit electrical pulses from the pulse generator to the heart. A biventricular pacemaker is used in people with heart failure due to weak heart muscles. The pacemaker can help the heart chambers pump more efficiently. This procedure may be done to treat:  Symptoms of severe heart failure, such as shortness of breath (dyspnea).  Loss of consciousness that happens repeatedly (syncope) because of an irregular heart rate. Tell  a health care provider about:  Any allergies you have.  All medicines you are taking, including vitamins, herbs, eye drops, creams, and over-the-counter medicines.  Any problems you or family members have had with anesthetic medicines.  Any blood disorders you have.  Any surgeries you have had.  Any medical conditions you have.  Whether you are pregnant or may be pregnant. What are the risks? Generally, this is a safe procedure. However, problems may occur, including:  Infection.  Swelling, bruising, or bleeding at the pacemaker site, especially if you take blood thinners.  Allergic reactions to medicines or dyes.  Damage to blood vessels or nerves near the pacemaker.  Failure of the pacemaker to improve your condition.  Collapsed lung.  Blood clots.  Lead failures. This may require more surgery. What happens before the procedure? Medicines Ask your health care provider about:  Changing or stopping your regular medicines. This is especially important if you are taking diabetes medicines or blood thinners.  Taking medicines such as aspirin and ibuprofen. These medicines can thin your blood. Do not take these medicines unless your health care provider tells you to take them.  Taking over-the-counter medicines, vitamins, herbs, and supplements. Eating and drinking Follow instructions from your health care provider about eating and drinking, which may include:  8 hours before the procedure - stop eating heavy meals or foods, such as meat, fried foods, or fatty foods.  6 hours before the procedure - stop eating light meals or foods, such as toast or cereal.  6 hours before the procedure - stop drinking milk or drinks that contain milk.  2  hours before the procedure - stop drinking clear liquids. Staying hydrated Follow instructions from your health care provider about hydration, which may include:  Up to 2 hours before the procedure - you may continue to drink clear  liquids, such as water, clear fruit juice, black coffee, and plain tea.  General instructions  Do not use any products that contain nicotine or tobacco for at least 4 weeks before the procedure. These products include cigarettes, e-cigarettes, and chewing tobacco. If you need help quitting, ask your health care provider.  You may have tests, including: ? Blood tests. ? Chest X-rays. ? Echocardiogram. This is a test that uses sound waves (ultrasound) to produce an image of the heart.  Ask your health care provider: ? How your surgery site will be marked. ? What steps will be taken to help prevent infection. These may include:  Removing hair at the surgery site.  Washing skin with a germ-killing soap.  Taking antibiotic medicine.  Plan to have someone take you home from the hospital or clinic.  If you will be going home right after the procedure, plan to have someone with you for 24 hours. What happens during the procedure?   An IV will be inserted into one of your veins.  You will be connected to a heart monitor. Large electrode pads will be placed on the front and back of your chest.  You will be given one or more of the following: ? A medicine to help you relax (sedative). ? A medicine to make you fall asleep (general anesthetic). ? A medicine that is injected into an area of your body to numb the area (local anesthetic).  An incision will be made in your upper chest, near your heart.  The leads will be guided into your incision, through your blood vessels, and into your heart. Your health care provider will use an X-ray machine (fluoroscope) to guide the leads into your heart.  The leads will be attached to your heart muscles and to the pulse generator.  The leads will be tested to make sure that they work correctly.  The pulse generator will be implanted under your skin, near your incision.  The heart monitor will be watched to ensure that the pacer is working  correctly.  Your incision will be closed with stitches (sutures), skin glue, or adhesive tape.  A bandage (dressing) will be placed over your incision. The procedure may vary among health care providers and hospitals. What happens after the procedure?  Your blood pressure, heart rate, breathing rate, and blood oxygen level will be monitored until you leave the hospital or clinic.  You may continue to receive fluids and medicines through an IV.  You will be given pain medicine as needed.  You will have a chest X-ray done. This is to make sure that your pacemaker is in the right place.  You will be given a pacemaker identification card. This card lists the implant date, device model, and manufacturer of your pacemaker.  Do not drive for 24 hours if you were given a sedative during your procedure. Summary  A pacemaker is a small, battery-powered device that helps control the heartbeat.  A biventricular pacemaker is used in people with heart failure due to weak heart muscles.  Follow instructions from your health care provider about taking medicines and about eating and drinking before the procedure.  You will be given a pacemaker identification card that lists the implant date, device model, and manufacturer of your  pacemaker. This information is not intended to replace advice given to you by your health care provider. Make sure you discuss any questions you have with your health care provider. Document Revised: 09/24/2018 Document Reviewed: 09/24/2018 Elsevier Patient Education  2020 [employer].

## 2020-06-13 ENCOUNTER — Telehealth: Payer: Self-pay

## 2020-06-13 MED ORDER — SACUBITRIL-VALSARTAN 24-26 MG PO TABS: 1.0000 | ORAL_TABLET | Freq: Two times a day (BID) | ORAL | 1 refills | Status: DC

## 2020-06-13 NOTE — Telephone Encounter (Addendum)
**Note De-Identified  Obfuscation** The pt states that she does not know if she has a deductible to meet or not through her insurance plan but that she feels that if there is one she should have met it by now as she buys a lot of medications.  She states that she is concerned that even if she has met her deductible she cannot afford the monthly cost of Entresto thereafter.  I have s/w the pt several times in the past concerning Entresto cost. Our last conversation was on 05/06/2020 as follows:  Me   05/06/20 2:37 PM Note Per [place] the cost to the pt for Entresto is $490/30 day supply and that a PA is not required as the pts insurance is paying on it.  I called the pt and she is unsure if she is in the donut hole or if she has a deductible. She states that she is calling her insurance provider now and will call [employer] pt asst after that to see if she is eligible for their pt asst program. I did give her their number and asked that she request that they mail her an application.  Also, she states that she has 10 Olmesartan tablets left on hand and has not started taking Entresto yet. I have advised her to continue to take Olmesartan until we find a solution to her Delene Loll cost.   She verbalized understanding and is in agreement with plan.        I have advised her that I will send in a RX for Entresto 24-26 mg (1 tablet BID) #60 to [facility], I will call them to get her cost and then I will call her back to discuss. She verbalized understanding and thanked me for calling her.

## 2020-06-13 NOTE — Telephone Encounter (Addendum)
**Note De-Identified  Obfuscation** The pt has already used a one time only 30 day free card in November of 2017 and because she has Medicare Part D she is not eligible for any other discount cards.  I have s/w the pt several times in the past concerning Entresto and the last conversation was on 05/06/20 as follows: Me  05/06/20 2:37 PM Note Per [place] the cost to the pt for Entresto is $490/30 day supply and that a PA is not required as the pts insurance is paying on it.  I called the pt and she is unsure if she is in the donut hole or if she has a deductible. She states that she is calling her insurance provider now and will call [employer] pt asst after that to see if she is eligible for their pt asst program. I did give her their number and asked that she request that they mail her an application.  Also, she states that she has 10 Olmesartan tablets left on hand and has not started taking Entresto yet. I have advised her to continue to take Olmesartan until we find a solution to her Delene Loll cost.   She verbalized understanding and is in agreement with plan.

## 2020-06-13 NOTE — Telephone Encounter (Signed)
**Note De-Identified  Obfuscation** Per [facility] the pts cost for Delene Loll is now $396/30 day supply and they are unsure if her deductible has been met and the only way to know is for the pt to contact her Ins provider to ask.  I called the pt but got no answer so I left a detailed message (Ok per [facility] on her VM advising the pt to call her ins provider to ask if she owes a deducible and that if she does how much is it and how much will Delene Loll cost her once her deductible is met.  Also, I advised in the VM message that [employer] pt asst is for the uninsured and underinsured person as we have discussed in the past and asked her to call [employer] [facility] at [phone] to ask any questions she may have concerning applying for asst with them and to request that they mail her an application.  I also left my name and the office phone number so she can call me back with any questions or concerns.

## 2020-06-13 NOTE — Telephone Encounter (Signed)
**Note De-identified  Obfuscation** -----  **Note De-Identified  Obfuscation** Message from Liliane Shi, [place] sent at 06/10/2020  1:38 PM EDT ----- Joanette Gula! Can you help her with Delene Loll? She was told she could not use the Rx card and the cost for her would be $500. Thanks! Scott

## 2020-06-13 NOTE — Telephone Encounter (Signed)
Maureen from [facility] calling stating their fax is not working and is requesting the clearance be emailed to  [email or web address]. She states there is no other fax number for the office and the patient is scheduled to her extraction tomorrow morning.

## 2020-06-14 ENCOUNTER — Telehealth: Payer: Self-pay | Admitting: Family Medicine

## 2020-06-14 NOTE — Telephone Encounter (Signed)
We need to do a follow up visit. Can she be scheduled for a virtual visit?

## 2020-06-14 NOTE — Telephone Encounter (Signed)
Pt is stating that she is still having sob and coughing from bronchitis and wants to know if there is anything that she can take. Please advise

## 2020-06-14 NOTE — Telephone Encounter (Signed)
Called [facility], verified receipt with [place]

## 2020-06-14 NOTE — Telephone Encounter (Signed)
EMAIL sent via copier successful transmission report received

## 2020-06-14 NOTE — Telephone Encounter (Signed)
Pt is stating that she is still having sob and coughing from bronchitis and wants to know if there is anything that she can take. Please advise.  Mitsuo Budnick,cma

## 2020-06-15 NOTE — Telephone Encounter (Signed)
I called the patient and scheduled a virtual visit with her.  Melanie Ray,cma

## 2020-06-22 ENCOUNTER — Telehealth: Payer: Medicare Other | Admitting: Family Medicine

## 2020-06-27 ENCOUNTER — Telehealth (INDEPENDENT_AMBULATORY_CARE_PROVIDER_SITE_OTHER): Payer: Medicare Other | Admitting: Family Medicine

## 2020-06-27 ENCOUNTER — Encounter: Payer: Self-pay | Admitting: Family Medicine

## 2020-06-27 DIAGNOSIS — R059 Cough, unspecified: Secondary | ICD-10-CM

## 2020-06-27 DIAGNOSIS — R05 Cough: Secondary | ICD-10-CM | POA: Diagnosis not present

## 2020-06-27 DIAGNOSIS — R413 Other amnesia: Secondary | ICD-10-CM | POA: Diagnosis not present

## 2020-06-27 NOTE — Progress Notes (Signed)
Virtual Visit via telephone Note  This visit type was conducted due to national recommendations for restrictions regarding the COVID-19 pandemic (e.g. social distancing).  This format is felt to be most appropriate for this patient at this time.  All issues noted in this document were discussed and addressed.  No physical exam was performed (except for noted visual exam findings with Video Visits).   I connected with Mahlon Gammon today at  3:15 PM EDT by a video enabled telemedicine application or telephone and verified that I am speaking with the correct person using two identifiers. Location patient: home Location provider: work  Persons participating in the virtual visit: patient, provider, Marizol Borror (husband)  I discussed the limitations, risks, security and privacy concerns of performing an evaluation and management service by telephone and the availability of in person appointments. I also discussed with the patient that there may be a patient responsible charge related to this service. The patient expressed understanding and agreed to proceed.  Interactive audio and video telecommunications were attempted between this provider and patient, however failed, due to patient having technical difficulties OR patient did not have access to video capability.  We continued and completed visit with audio only.   Reason for visit: f/u.  HPI: Bronchitis: Patient notes her cough is about 90% improved.  She notes her shortness of breath is about 90% improved.  She was treated with azithromycin for this the first week of August.  Minimal wheezing currently.  No fevers.  She does note some mild pedal edema and did see her cardiology office the week after she was treated for bronchitis and they noted that they felt she was slightly volume overloaded.  She has not been taking the Lasix as they prescribed.  Memory difficulty: Patient feels as though she forgets a lot more than usual.  She  forgets small things like where her phone is as well as what she went upstairs to get.  This has been going on for several months and progressively worsening.  She has not gotten lost while driving.  No ADL issues.  She does note mild depression in the morning though it improves throughout the day.  No SI.  She does follow with psychiatry.   ROS: See pertinent positives and negatives per HPI.  Past Medical History:  Diagnosis Date  . Anemia   . Anxiety   . Back pain   . CAD (coronary artery disease) 04/11/2016   S/p ant STEMI 5/17: LHC >> LAD proximal 80%, mid 80%, distal 50%, ostial D1 60%; LCx with LPDA lesion 30%; RCA Mild calcification with no significant stenosis in a medium caliber, nondominant RCA; LVEF is estimated at 45% with inferoapical and lateral wall akinesis >> PCI: PCI: 3.5 x 24 mm Promus DES to prox LAD, 2.5 x 12 mm Promus DES to mid LAD. // Myoview 7/21: EF 20, no ischemia; high risk   . Chest pain   . Chronic systolic CHF (congestive heart failure) ([place]) 03/21/2016   Echo 01/30/17: Diff HK, mild focal basal septal hypertrophy, EF 30-35, mild AI, MAC, mild MR // Echo 06/08/16: Mild focal basal septal hypertrophy, EF 25-30%, diff HK, ant-septal AK, Gr 1 DD, mild AI, MAC, mild MR, PASP 37 mmHg // Echo 03/18/16: EF 30-35%, ant-septal AK, Gr 1 DD, mild MR, severe LAE.  [name] Constipation   . Depression   . Diabetes mellitus type 2 in obese ([place])   . Dizziness   . Glaucoma   . Gout   .  Heart disease   . Heartburn   . History of acute anterior wall MI 03/17/2016  . History of heart attack   . Hyperlipemia   . Hypertension   . Ischemic cardiomyopathy 10/02/2016   Refused ICD  . Joint pain   . Left bundle branch block   . Myocardial infarction ([place])   . Nausea   . Sleep apnea   . SOB (shortness of breath)   . Suicidal ideation 08/27/2018  . Swelling    feet or legs    Past Surgical History:  Procedure Laterality Date  . APPENDECTOMY    . CARDIAC CATHETERIZATION N/A  03/17/2016   Procedure: Left Heart Cath and Coronary Angiography;  Surgeon: Sherren Mocha, MD;  Location: [facility];  Service: Cardiovascular;  Laterality: N/A;  . CARDIAC CATHETERIZATION N/A 03/17/2016   Procedure: Coronary Stent Intervention;  Surgeon: Sherren Mocha, MD;  Location: [facility];  Service: Cardiovascular;  Laterality: N/A;  . COLONOSCOPY WITH PROPOFOL N/A 08/13/2017   Procedure: COLONOSCOPY WITH PROPOFOL;  Surgeon: Jonathon Bellows, MD;  Location: [facility] ENDOSCOPY;  Service: Gastroenterology;  Laterality: N/A;  . COLONOSCOPY WITH PROPOFOL N/A 03/29/2020   Procedure: COLONOSCOPY WITH PROPOFOL;  Surgeon: Lucilla Lame, MD;  Location: [facility] ENDOSCOPY;  Service: Endoscopy;  Laterality: N/A;  . SMALL BOWEL REPAIR    . TONSILLECTOMY    . UTERINE FIBROID SURGERY      Family History  Problem Relation Age of Onset  . Anxiety disorder Mother   . Paranoid behavior Mother   . Hypertension Mother   . Heart failure Mother   . Stroke Mother   . Dementia Mother   . High Cholesterol Mother   . Diabetes Mother   . Hyperlipidemia Mother   . Heart disease Mother   . Depression Mother   . Hypertension Father   . High Cholesterol Father   . Mood Disorder Sister   . Stroke Sister   . Anxiety disorder Maternal Aunt   . Drug abuse Cousin   . Tuberculosis Paternal Grandfather     SOCIAL HX: Former smoker   Current Outpatient Medications:  .  albuterol (VENTOLIN HFA) 108 (90 Base) MCG/ACT inhaler, Inhale 2 puffs into the lungs every 6 (six) hours as needed for shortness of breath. , Disp: , Rfl:  .  atorvastatin (LIPITOR) 80 MG tablet, Take 1 tablet by mouth once daily, Disp: 90 tablet, Rfl: 3 .  blood glucose meter kit and supplies KIT, Dispense based on patient and insurance preference.  Check glucose once daily fasting in the morning. (FOR ICD 10 E11.9)., Disp: 1 each, Rfl: 0 .  carvedilol (COREG) 12.5 MG tablet, TAKE 1 TABLET BY MOUTH TWICE DAILY WITH MEALS, Disp: 180 tablet,  Rfl: 3 .  clopidogrel (PLAVIX) 75 MG tablet, Take 1 tablet (75 mg total) by mouth daily., Disp: 90 tablet, Rfl: 1 .  Cyanocobalamin (VITAMIN B-12 PO), Take 1 capsule by mouth daily., Disp: , Rfl:  .  dorzolamide-timolol (COSOPT) 22.3-6.8 MG/ML ophthalmic solution, Place 1 drop into both eyes 2 (two) times daily. , Disp: , Rfl:  .  hydrOXYzine (ATARAX/VISTARIL) 10 MG tablet, Take 1 tablet (10 mg total) by mouth 3 (three) times daily as needed., Disp: 30 tablet, Rfl: 0 .  isosorbide mononitrate (IMDUR) 30 MG 24 hr tablet, Take 1 tablet (30 mg total) by mouth daily., Disp: 90 tablet, Rfl: 1 .  latanoprost (XALATAN) 0.005 % ophthalmic solution, Place 1 drop into both eyes at bedtime., Disp: , Rfl:  .  LATUDA 60 MG TABS, Take 60 mg by mouth daily with breakfast. , Disp: , Rfl:  .  metFORMIN (GLUCOPHAGE) 500 MG tablet, TAKE 1 TABLET BY MOUTH TWICE DAILY WITH  A  MEAL, Disp: 180 tablet, Rfl: 0 .  Multiple Vitamin (MULTI-VITAMIN DAILY) TABS, Take 1 tablet by mouth daily., Disp: , Rfl:  .  nitroGLYCERIN (NITROSTAT) 0.4 MG SL tablet, DISSOLVE ONE TABLET UNDER THE TONGUE EVERY 5 MINUTES AS NEEDED FOR CHEST PAIN.  DO NOT EXCEED A TOTAL OF 3 DOSES IN 15 MINUTES, Disp: 25 tablet, Rfl: 10 .  OLANZapine (ZYPREXA) 5 MG tablet, Take 1 tablet (5 mg total) by mouth at bedtime. For anxiety and sleep, Disp: 30 tablet, Rfl: 1 .  olmesartan (BENICAR) 20 MG tablet, Take 1 tablet (20 mg total) by mouth daily., Disp: 90 tablet, Rfl: 1 .  spironolactone (ALDACTONE) 25 MG tablet, Take 1/2 (one-half) tablet by mouth once daily, Disp: 45 tablet, Rfl: 3 .  traZODone (DESYREL) 100 MG tablet, Take 100 mg by mouth at bedtime as needed for sleep., Disp: , Rfl:  .  venlafaxine XR (EFFEXOR-XR) 150 MG 24 hr capsule, TAKE 2 CAPSULES BY MOUTH ONCE DAILY (NEEDS  APPOINTMENT  FOR  FURTHER  REFILLS), Disp: 60 capsule, Rfl: 0 .  furosemide (LASIX) 40 MG tablet, Take 1 tablet (40 mg total) by mouth as directed. 40 mg on Mon, Wed and Fri's; all  other days 20 mg, Disp: 65 tablet, Rfl: 3 .  sacubitril-valsartan (ENTRESTO) 24-26 MG, Take 1 tablet by mouth 2 (two) times daily. (Patient not taking: Reported on 06/27/2020), Disp: 60 tablet, Rfl: 1  EXAM:  VITALS per patient if applicable:  GENERAL: alert, oriented, appears well and in no acute distress  HEENT: atraumatic, conjunttiva clear, no obvious abnormalities on inspection of external nose and ears  NECK: normal movements of the head and neck  LUNGS: on inspection no signs of respiratory distress, breathing rate appears normal, no obvious gross SOB, gasping or wheezing  CV: no obvious cyanosis  MS: moves all visible extremities without noticeable abnormality  PSYCH/NEURO: pleasant and cooperative, no obvious depression or anxiety, speech and thought processing grossly intact  ASSESSMENT AND PLAN:  Discussed the following assessment and plan:  Cough Much improved.  Suspect there may be some small amount of volume overload given persistent symptoms and slight edema.  Encouraged her to take the furosemide as prescribed by her cardiologist.  We will check lab work in 2 weeks.  Memory difficulty Ongoing issue in the recent past.  Patient wanted Aricept for this though I discussed we had to work this up first and then consider if that was appropriate.  Will obtain lab work as outlined below to look for possible causes.   Orders Placed This Encounter  Procedures  . Basic Metabolic Panel (BMET)    Standing Status:   Future    Standing Expiration Date:   06/28/2021  . B12    Standing Status:   Future    Standing Expiration Date:   06/28/2021  . TSH    Standing Status:   Future    Standing Expiration Date:   06/28/2021    No orders of the defined types were placed in this encounter.    I discussed the assessment and treatment plan with the patient. The patient was provided an opportunity to ask questions and all were answered. The patient agreed with the plan and  demonstrated an understanding of the instructions.   The patient was advised  to call back or seek an in-person evaluation if the symptoms worsen or if the condition fails to improve as anticipated.  I provided 12 minutes of non-face-to-face time during this encounter.   Tommi Rumps, MD

## 2020-06-28 DIAGNOSIS — R413 Other amnesia: Secondary | ICD-10-CM | POA: Insufficient documentation

## 2020-06-28 NOTE — Assessment & Plan Note (Signed)
Much improved.  Suspect there may be some small amount of volume overload given persistent symptoms and slight edema.  Encouraged her to take the furosemide as prescribed by her cardiologist.  We will check lab work in 2 weeks.

## 2020-06-28 NOTE — Assessment & Plan Note (Signed)
Ongoing issue in the recent past.  Patient wanted Aricept for this though I discussed we had to work this up first and then consider if that was appropriate.  Will obtain lab work as outlined below to look for possible causes.

## 2020-06-29 ENCOUNTER — Ambulatory Visit: Payer: Medicare Other | Admitting: Neurology

## 2020-06-30 ENCOUNTER — Telehealth: Payer: Self-pay | Admitting: Family Medicine

## 2020-06-30 NOTE — Telephone Encounter (Signed)
"  Rejection Reason - Patient Declined" [facility] said about 9 hours ago

## 2020-07-01 ENCOUNTER — Ambulatory Visit: Payer: Medicare Other | Admitting: Physician Assistant

## 2020-07-04 ENCOUNTER — Other Ambulatory Visit: Payer: Self-pay | Admitting: Family Medicine

## 2020-07-06 ENCOUNTER — Ambulatory Visit: Payer: Medicare Other | Admitting: Family Medicine

## 2020-07-25 DIAGNOSIS — Z79899 Other long term (current) drug therapy: Secondary | ICD-10-CM | POA: Diagnosis not present

## 2020-07-25 DIAGNOSIS — F411 Generalized anxiety disorder: Secondary | ICD-10-CM | POA: Diagnosis not present

## 2020-08-04 ENCOUNTER — Other Ambulatory Visit: Payer: Self-pay

## 2020-08-04 ENCOUNTER — Telehealth: Payer: Self-pay

## 2020-08-04 DIAGNOSIS — E119 Type 2 diabetes mellitus without complications: Secondary | ICD-10-CM

## 2020-08-04 MED ORDER — BLOOD GLUCOSE MONITOR KIT: PACK | 0 refills | Status: DC

## 2020-08-05 NOTE — Telephone Encounter (Signed)
New Rx was signed and dated and faxed to the pharmacy fax was confirmed.  Hibo Blasdell,cma

## 2020-08-13 ENCOUNTER — Other Ambulatory Visit: Payer: Self-pay | Admitting: Cardiovascular Disease

## 2020-08-15 ENCOUNTER — Telehealth: Payer: Self-pay | Admitting: Family Medicine

## 2020-08-15 NOTE — Telephone Encounter (Signed)
The prevagen likely will not help with her memory.

## 2020-08-15 NOTE — Telephone Encounter (Signed)
Pt called wanting to know if she could take prevagen for her memory.  Chrystina Naff,cma

## 2020-08-15 NOTE — Telephone Encounter (Signed)
Pt called wanting to know if she could take prevagen for her memory

## 2020-08-16 MED ORDER — OLMESARTAN MEDOXOMIL 20 MG PO TABS
20.0000 mg | ORAL_TABLET | Freq: Every day | ORAL | 3 refills | Status: DC
Start: 2020-08-16 — End: 2020-11-30

## 2020-08-16 NOTE — Telephone Encounter (Signed)
I spoke with patient & she is now scheduled for labs. She knows that a memory test will done at next OV on 11/10.

## 2020-08-16 NOTE — Telephone Encounter (Signed)
I called and informed the patient that the provider stated the Prevagen would likely not help with her memory and the patient wanted to know is there anything OVC she could take that would help with her memory or is there anything sh could do.  Litzi Binning,cma

## 2020-08-16 NOTE — Telephone Encounter (Signed)
She has labs ordered from her visit in August that need to be completed to look for potential causes of memory difficulty.  We also need to complete memory testing in the office with her next visit and then could consider starting on medication if it is indicated.

## 2020-08-18 ENCOUNTER — Other Ambulatory Visit (INDEPENDENT_AMBULATORY_CARE_PROVIDER_SITE_OTHER): Payer: Medicare Other

## 2020-08-18 ENCOUNTER — Other Ambulatory Visit: Payer: Self-pay

## 2020-08-18 DIAGNOSIS — R059 Cough, unspecified: Secondary | ICD-10-CM | POA: Diagnosis not present

## 2020-08-18 DIAGNOSIS — R413 Other amnesia: Secondary | ICD-10-CM | POA: Diagnosis not present

## 2020-08-19 LAB — BASIC METABOLIC PANEL
BUN: 23 mg/dL (ref 6–23)
CO2: 24 mEq/L (ref 19–32)
Calcium: 9.9 mg/dL (ref 8.4–10.5)
Chloride: 104 mEq/L (ref 96–112)
Creatinine, Ser: 1.33 mg/dL — ABNORMAL HIGH (ref 0.40–1.20)
GFR: 40.31 mL/min — ABNORMAL LOW (ref >60.00)
Glucose, Bld: 79 mg/dL (ref 70–99)
Potassium: 4.2 mEq/L (ref 3.5–5.1)
Sodium: 138 mEq/L (ref 135–145)

## 2020-08-19 LAB — TSH: TSH: 1.63 u[IU]/mL (ref 0.35–4.50)

## 2020-08-19 LAB — VITAMIN B12: Vitamin B-12: 1210 pg/mL — ABNORMAL HIGH (ref 211–911)

## 2020-08-20 DIAGNOSIS — Z23 Encounter for immunization: Secondary | ICD-10-CM | POA: Diagnosis not present

## 2020-09-12 DIAGNOSIS — H401133 Primary open-angle glaucoma, bilateral, severe stage: Secondary | ICD-10-CM | POA: Diagnosis not present

## 2020-09-12 DIAGNOSIS — E119 Type 2 diabetes mellitus without complications: Secondary | ICD-10-CM | POA: Diagnosis not present

## 2020-09-12 DIAGNOSIS — H5213 Myopia, bilateral: Secondary | ICD-10-CM | POA: Diagnosis not present

## 2020-09-12 DIAGNOSIS — H5340 Unspecified visual field defects: Secondary | ICD-10-CM | POA: Diagnosis not present

## 2020-09-14 ENCOUNTER — Encounter: Payer: Self-pay | Admitting: Family Medicine

## 2020-09-14 ENCOUNTER — Other Ambulatory Visit: Payer: Self-pay

## 2020-09-14 ENCOUNTER — Ambulatory Visit: Payer: Medicare Other | Admitting: Physician Assistant

## 2020-09-14 ENCOUNTER — Ambulatory Visit (INDEPENDENT_AMBULATORY_CARE_PROVIDER_SITE_OTHER): Payer: Medicare Other | Admitting: Family Medicine

## 2020-09-14 DIAGNOSIS — F331 Major depressive disorder, recurrent, moderate: Secondary | ICD-10-CM | POA: Diagnosis not present

## 2020-09-14 DIAGNOSIS — I639 Cerebral infarction, unspecified: Secondary | ICD-10-CM

## 2020-09-14 DIAGNOSIS — E663 Overweight: Secondary | ICD-10-CM

## 2020-09-14 DIAGNOSIS — E119 Type 2 diabetes mellitus without complications: Secondary | ICD-10-CM | POA: Diagnosis not present

## 2020-09-14 DIAGNOSIS — R002 Palpitations: Secondary | ICD-10-CM | POA: Insufficient documentation

## 2020-09-14 DIAGNOSIS — I10 Essential (primary) hypertension: Secondary | ICD-10-CM | POA: Diagnosis not present

## 2020-09-14 NOTE — Progress Notes (Signed)
Tommi Rumps, MD Phone: [phone]  Lanisha Stepanian is a 69 y.o. female who presents today for f/u.  HYPERTENSION  Disease Monitoring  Home BP Monitoring not checking Chest pain- no    Dyspnea- ongoing since she had bronchitis earlier this year Medications  Compliance-  Taking coreg, lasix, imdur, olmesartan, spironolactone.  Edema- some if she does not take her lasix No orthopnea or PND  Palpitations: Intermittent issue.  Has been going on for couple of weeks.  Lasts 10 to 15 minutes at a time and resolve on their own.  Overweight: Patient notes she altered her diet.  She stuck with a tuna diet.  She was eating almost strictly tuna for a while.  Had not been exercising though wants to get back to walking on the treadmill.  She has added fruit in recently.  Depression: Patient notes it was under control for most of the summer.  Recently she has felt her self getting more anxious and having slightly worsened depression.  She has had some passive SI where she wonders if it may be better if she were not here though she has no active SI and has no plan or intent to harm her self.  She has her husband and dog and nephew to live for.  She is currently on Latuda, Zyprexa, Effexor, and trazodone.  She follows with psychiatry and notes she last saw them 1 to 2 months ago.    Social History   Tobacco Use  Smoking Status Former Smoker  . Types: Cigarettes  . Quit date: 04/13/1997  . Years since quitting: 23.4  Smokeless Tobacco Never Used     ROS see history of present illness  Objective  Physical Exam Vitals:   09/14/20 1417  BP: 140/70  Pulse: 72  Temp: (!) 97.4 F (36.3 C)  SpO2: 97%    BP Readings from Last 3 Encounters:  09/14/20 140/70  06/10/20 122/80  05/03/20 132/68   Wt Readings from Last 3 Encounters:  09/14/20 165 lb 9.6 oz (75.1 kg)  06/27/20 170 lb (77.1 kg)  06/10/20 171 lb 9.6 oz (77.8 kg)    Physical Exam Constitutional:      General: She is  not in acute distress.    Appearance: She is not diaphoretic.  Cardiovascular:     Rate and Rhythm: Normal rate and regular rhythm.     Heart sounds: Normal heart sounds.  Pulmonary:     Effort: Pulmonary effort is normal.     Breath sounds: Normal breath sounds.  Musculoskeletal:     Right lower leg: No edema.     Left lower leg: No edema.  Skin:    General: Skin is warm and dry.  Neurological:     Mental Status: She is alert.    EKG: Sinus rhythm, rate 70, left bundle branch block that is chronic  Assessment/Plan: Please see individual problem list.  Problem List Items Addressed This Visit    Depression    Chronic ongoing issue.  Has worsened somewhat recently.  Notes passive SI.  Advised if she develops intent or plan to harm herself she needs to go to the emergency room.  I advised her to contact her psychiatrist to arrange for follow-up and she was willing to do so.  She does have protective factors in place including her husband, dog, and nephew.      DM type 2 (diabetes mellitus, type 2) (HCC)    Check A1c.  Continue Metformin.  Relevant Orders   HgB A1c   Essential hypertension    Adequate on recheck.  Continue current regimen of carvedilol, Lasix, Imdur, olmesartan, and spironolactone.      Overweight (BMI 25.0-29.9)    Patient's weight has trended down with dietary changes.  I discussed that she needs to expand her diet to include vegetables and some fruits.  Also discussed other lean meats.  Advised against eating just tuna or just fish given possible mercury issues.  She will expand to other lean meats.      Palpitations    Recent onset issue.  EKG performed today.  Recent TSH acceptable.  We will check lab work.  She will follow-up with cardiology.      Relevant Orders   EKG 12-Lead   CBC   Basic Metabolic Panel (BMET)   Ambulatory referral to Cardiology      This visit occurred during the SARS-CoV-2 public health emergency.  Safety protocols were  in place, including screening questions prior to the visit, additional usage of staff PPE, and extensive cleaning of exam room while observing appropriate contact time as indicated for disinfecting solutions.    Tommi Rumps, MD [place]

## 2020-09-14 NOTE — Assessment & Plan Note (Signed)
Adequate on recheck.  Continue current regimen of carvedilol, Lasix, Imdur, olmesartan, and spironolactone.

## 2020-09-14 NOTE — Assessment & Plan Note (Addendum)
Recent onset issue.  EKG performed today.  Recent TSH acceptable.  We will check lab work.  She will follow-up with cardiology.

## 2020-09-14 NOTE — Assessment & Plan Note (Signed)
Chronic ongoing issue.  Has worsened somewhat recently.  Notes passive SI.  Advised if she develops intent or plan to harm herself she needs to go to the emergency room.  I advised her to contact her psychiatrist to arrange for follow-up and she was willing to do so.  She does have protective factors in place including her husband, dog, and nephew.

## 2020-09-14 NOTE — Assessment & Plan Note (Signed)
Check A1c.  Continue Metformin. 

## 2020-09-14 NOTE — Patient Instructions (Signed)
Nice to see you. Please contact your psychiatrist for follow-up. If you develop thoughts of harming yourself with plan or intent to do so please go to the emergency department. I have referred you back to your cardiologist.  They should contact you to schedule an appointment.  We will get lab work today. Please expand her diet to include vegetables and fruits as well as other lean meats.  Please try to add in some walking on the treadmill for exercise.

## 2020-09-14 NOTE — Assessment & Plan Note (Signed)
Patient's weight has trended down with dietary changes.  I discussed that she needs to expand her diet to include vegetables and some fruits.  Also discussed other lean meats.  Advised against eating just tuna or just fish given possible mercury issues.  She will expand to other lean meats.

## 2020-09-15 LAB — BASIC METABOLIC PANEL
BUN: 21 mg/dL (ref 6–23)
CO2: 27 mEq/L (ref 19–32)
Calcium: 9.6 mg/dL (ref 8.4–10.5)
Chloride: 103 mEq/L (ref 96–112)
Creatinine, Ser: 1.22 mg/dL — ABNORMAL HIGH (ref 0.40–1.20)
GFR: 45 mL/min — ABNORMAL LOW (ref >60.00)
Glucose, Bld: 78 mg/dL (ref 70–99)
Potassium: 4 mEq/L (ref 3.5–5.1)
Sodium: 139 mEq/L (ref 135–145)

## 2020-09-15 LAB — CBC
HCT: 38.3 % (ref 36.0–46.0)
Hemoglobin: 12.6 g/dL (ref 12.0–15.0)
MCHC: 33 g/dL (ref 30.0–36.0)
MCV: 80.1 fl (ref 78.0–100.0)
Platelets: 344 10*3/uL (ref 150.0–400.0)
RBC: 4.78 Mil/uL (ref 3.87–5.11)
RDW: 14.6 % (ref 11.5–15.5)
WBC: 8 10*3/uL (ref 4.0–10.5)

## 2020-09-15 LAB — HEMOGLOBIN A1C: Hgb A1c MFr Bld: 6.7 % — ABNORMAL HIGH (ref 4.6–6.5)

## 2020-09-23 ENCOUNTER — Telehealth: Payer: Self-pay | Admitting: *Deleted

## 2020-09-23 DIAGNOSIS — N179 Acute kidney failure, unspecified: Secondary | ICD-10-CM

## 2020-09-23 NOTE — Telephone Encounter (Signed)
Please place future orders for lab appt.  

## 2020-09-26 NOTE — Telephone Encounter (Signed)
Ordered

## 2020-09-27 ENCOUNTER — Other Ambulatory Visit (INDEPENDENT_AMBULATORY_CARE_PROVIDER_SITE_OTHER): Payer: Medicare Other

## 2020-09-27 ENCOUNTER — Other Ambulatory Visit: Payer: Self-pay

## 2020-09-27 DIAGNOSIS — N179 Acute kidney failure, unspecified: Secondary | ICD-10-CM | POA: Diagnosis not present

## 2020-09-27 LAB — BASIC METABOLIC PANEL
BUN: 18 mg/dL (ref 6–23)
CO2: 27 mEq/L (ref 19–32)
Calcium: 9.9 mg/dL (ref 8.4–10.5)
Chloride: 101 mEq/L (ref 96–112)
Creatinine, Ser: 1.14 mg/dL (ref 0.40–1.20)
GFR: 48.81 mL/min — ABNORMAL LOW (ref >60.00)
Glucose, Bld: 75 mg/dL (ref 70–99)
Potassium: 4.1 mEq/L (ref 3.5–5.1)
Sodium: 136 mEq/L (ref 135–145)

## 2020-09-28 ENCOUNTER — Telehealth: Payer: Self-pay | Admitting: Family Medicine

## 2020-09-28 MED ORDER — CLOPIDOGREL BISULFATE 75 MG PO TABS
75.0000 mg | ORAL_TABLET | Freq: Every day | ORAL | 1 refills | Status: DC
Start: 2020-09-28 — End: 2021-02-24

## 2020-09-28 NOTE — Telephone Encounter (Signed)
Patient called in for refill for clopidogrel (PLAVIX) 75 MG tablet she stated that she is out and going out of town

## 2020-10-05 ENCOUNTER — Other Ambulatory Visit: Payer: Self-pay | Admitting: Physician Assistant

## 2020-10-11 ENCOUNTER — Telehealth: Payer: Self-pay

## 2020-10-11 DIAGNOSIS — Z03818 Encounter for observation for suspected exposure to other biological agents ruled out: Secondary | ICD-10-CM | POA: Diagnosis not present

## 2020-10-11 DIAGNOSIS — Z1152 Encounter for screening for COVID-19: Secondary | ICD-10-CM | POA: Diagnosis not present

## 2020-10-11 NOTE — Telephone Encounter (Signed)
-----   Message from Leone Haven, MD sent at 10/08/2020  9:10 AM EST ----- Please let the patient know that her kidney function is slightly improved from previously though does still remain lower than normal. She need to avoid NSAIDs such as ibuprofen and aleve. She should have follow-up with me in 2 months to recheck. Given the decrease from her baseline I would suggest getting a renal US to look at the structure of her kidneys.

## 2020-10-12 ENCOUNTER — Telehealth: Payer: Self-pay | Admitting: Family Medicine

## 2020-10-12 NOTE — Telephone Encounter (Signed)
Noted. Please follow-up with her to see that she went to urgent care to be evaluated. Thanks.

## 2020-10-12 NOTE — Telephone Encounter (Signed)
Pt called and said that she got her results back last night from her Covid test and it was neg She is having congestion, cough and fatigue she wanted an in office appt. I told her with the symptoms  she is having she wouldn't be able to come in.Melanie Ray a VV  Pt stated that she would go to an UC to be seen in person.  [name],cma

## 2020-10-12 NOTE — Progress Notes (Signed)
Cardiology Office Note   Date:  10/14/2020   ID:  Kerin, Kren 1950/12/22, MRN [id number]  PCP:  Leone Haven, MD  Cardiologist:  Dr. Burt Knack, MD    Chief Complaint  Patient presents with  . Follow-up    History of Present Illness: Melanie Ray is a 69 y.o. female who presents for follow-up/evaluation of shortness of breath, seen for Dr. Burt Knack.  Melanie Ray has a history of CAD s/p anterior STEMI in 03/2016 with PCI/DES x2 to the LAD, chronic systolic CHF/ischemic cardiomyopathy with most recent echocardiogram 03/2020 with EF less than 20% (deferred CRT), obsessive-compulsive disorder, DM2, HTN, HLD, LBBB, OSA, cerebellar CVA 03/2020, and bilateral vertebral artery stenosis  She was admitted to the hospital 03/2020 with a stroke. Echocardiogram at that time showed worsening LV function with an LVEF at less than 20%. She was transitioned from [facility] to [facility] therapy. She had subsequent arm symptoms at which time she was set up for [facility] stress test which showed no ischemia. Case discussed with primary cardiologist, Dr. Burt Knack who recommended CRT therapy which was dicussed however she deferred. Plan was to refer her to [name] if she changed her mind.  She was somewhat volume overloaded on last OV 06/10/2020 therefore she was placed on Lasix 40 mg x 3 days then to reduce the dose to 20 mg 3 times a week with follow-up lab work.  As above, initial plan was to transition from [facility]B to [facility] however this was cost prohibitive therefore she remains on olmesartan.  Plan was to arrange financial assistance for her with close follow up however she has not been seen since that time.   Today she is here and reports worsening shortness of breath with significant orthopnea symptoms.  She states that over the course of the last several weeks she has been unable to sleep due to the inability to lie flat.  She states she gets significantly short of breath with any  ambulation which is changed for her.  She denies chest pain or other anginal symptoms.  She does not appear overtly fluid volume overloaded on exam however reports poor Lasix compliance. Per MAR, she is to take 40 mg on MWF and 20 mg every other day.  She states she rarely takes Lasix daily at this point. Does state that she weighs herself several times per week and has not noticed a significant change however cannot confirm this.  We discussed my concerns given her history of CAD and a poor LV function.  She is agreeable to pursue CRT placement at this time therefore will make referral to [name] as previously discussed by Dr. Burt Knack.  Also discussed up-titrating her Lasix dosing for several days and discussed the importance of med compliance at this time. There is concern for progression of CAD given her history therefore if at follow-up she is with no significant change in her symptoms, likely will need to pursue further ischemic work-up.  Past Medical History:  Diagnosis Date  . Anemia   . Anxiety   . Back pain   . CAD (coronary artery disease) 04/11/2016   S/p ant STEMI 5/17: LHC >> LAD proximal 80%, mid 80%, distal 50%, ostial D1 60%; LCx with LPDA lesion 30%; RCA Mild calcification with no significant stenosis in a medium caliber, nondominant RCA; LVEF is estimated at 45% with inferoapical and lateral wall akinesis >> PCI: PCI: 3.5 x 24 mm Promus DES to prox LAD, 2.5 x  12 mm Promus DES to mid LAD. // Myoview 7/21: EF 20, no ischemia; high risk   . Chest pain   . Chronic systolic CHF (congestive heart failure) ([place]) 03/21/2016   Echo 01/30/17: Diff HK, mild focal basal septal hypertrophy, EF 30-35, mild AI, MAC, mild MR // Echo 06/08/16: Mild focal basal septal hypertrophy, EF 25-30%, diff HK, ant-septal AK, Gr 1 DD, mild AI, MAC, mild MR, PASP 37 mmHg // Echo 03/18/16: EF 30-35%, ant-septal AK, Gr 1 DD, mild MR, severe LAE.  [name] Constipation   . Depression   . Diabetes mellitus type 2 in obese ([place])    . Dizziness   . Glaucoma   . Gout   . Heart disease   . Heartburn   . History of acute anterior wall MI 03/17/2016  . History of heart attack   . Hyperlipemia   . Hypertension   . Ischemic cardiomyopathy 10/02/2016   Refused ICD  . Joint pain   . Left bundle branch block   . Myocardial infarction ([place])   . Nausea   . Sleep apnea   . SOB (shortness of breath)   . Suicidal ideation 08/27/2018  . Swelling    feet or legs    Past Surgical History:  Procedure Laterality Date  . APPENDECTOMY    . CARDIAC CATHETERIZATION N/A 03/17/2016   Procedure: Left Heart Cath and Coronary Angiography;  Surgeon: Sherren Mocha, MD;  Location: [place];  Service: Cardiovascular;  Laterality: N/A;  . CARDIAC CATHETERIZATION N/A 03/17/2016   Procedure: Coronary Stent Intervention;  Surgeon: Sherren Mocha, MD;  Location: [place] [facility];  Service: Cardiovascular;  Laterality: N/A;  . COLONOSCOPY WITH PROPOFOL N/A 08/13/2017   Procedure: COLONOSCOPY WITH PROPOFOL;  Surgeon: Jonathon Bellows, MD;  Location: [facility] ENDOSCOPY;  Service: Gastroenterology;  Laterality: N/A;  . COLONOSCOPY WITH PROPOFOL N/A 03/29/2020   Procedure: COLONOSCOPY WITH PROPOFOL;  Surgeon: Lucilla Lame, MD;  Location: [facility] ENDOSCOPY;  Service: Endoscopy;  Laterality: N/A;  . SMALL BOWEL REPAIR    . TONSILLECTOMY    . UTERINE FIBROID SURGERY     Current Outpatient Medications  Medication Sig Dispense Refill  . albuterol (VENTOLIN HFA) 108 (90 Base) MCG/ACT inhaler Inhale 2 puffs into the lungs every 6 (six) hours as needed for shortness of breath.     [name] atorvastatin (LIPITOR) 80 MG tablet Take 1 tablet by mouth once daily 90 tablet 3  . blood glucose meter kit and supplies KIT Dispense based on patient and insurance preference.  Check glucose once daily fasting in the morning. (FOR ICD 10 E11.9). 1 each 0  . carvedilol (COREG) 12.5 MG tablet TAKE 1 TABLET BY MOUTH TWICE DAILY WITH MEALS 180 tablet 3  . clopidogrel  (PLAVIX) 75 MG tablet Take 1 tablet (75 mg total) by mouth daily. 90 tablet 1  . Cyanocobalamin (VITAMIN B-12 PO) Take 1 capsule by mouth daily.    . dorzolamide-timolol (COSOPT) 22.3-6.8 MG/ML ophthalmic solution Place 1 drop into both eyes 2 (two) times daily.     . hydrOXYzine (ATARAX/VISTARIL) 10 MG tablet Take 1 tablet (10 mg total) by mouth 3 (three) times daily as needed. 30 tablet 0  . latanoprost (XALATAN) 0.005 % ophthalmic solution Place 1 drop into both eyes at bedtime.    [name] LATUDA 60 MG TABS Take 60 mg by mouth daily with breakfast.     . metFORMIN (GLUCOPHAGE) 500 MG tablet TAKE 1 TABLET BY MOUTH TWICE DAILY WITH A MEAL 180  tablet 0  . nitroGLYCERIN (NITROSTAT) 0.4 MG SL tablet DISSOLVE ONE TABLET UNDER THE TONGUE EVERY 5 MINUTES AS NEEDED FOR CHEST PAIN.  DO NOT EXCEED A TOTAL OF 3 DOSES IN 15 MINUTES 25 tablet 10  . OLANZapine (ZYPREXA) 5 MG tablet Take 1 tablet (5 mg total) by mouth at bedtime. For anxiety and sleep 30 tablet 1  . olmesartan (BENICAR) 20 MG tablet Take 1 tablet (20 mg total) by mouth daily. 90 tablet 3  . spironolactone (ALDACTONE) 25 MG tablet Take 1/2 (one-half) tablet by mouth once daily 45 tablet 3  . traZODone (DESYREL) 100 MG tablet Take 100 mg by mouth at bedtime as needed for sleep.    [name] venlafaxine XR (EFFEXOR-XR) 150 MG 24 hr capsule TAKE 2 CAPSULES BY MOUTH ONCE DAILY (NEEDS  APPOINTMENT  FOR  FURTHER  REFILLS) 60 capsule 0  . furosemide (LASIX) 40 MG tablet TAKE LASIX 40 MG DAILY FOR 6 DAYS THEN RESUME PRIOR DOSING AT 40 MG Monday, Wednesday, Friday AND 20 MG EVERY OTHER DAY. 90 tablet 3  . isosorbide mononitrate (IMDUR) 30 MG 24 hr tablet Take 1 tablet (30 mg total) by mouth daily. 90 tablet 3   No current facility-administered medications for this visit.    Allergies:   Tetracycline and Meperidine    Social History:  The patient  reports that she quit smoking about 23 years ago. Her smoking use included cigarettes. She has never used smokeless  tobacco. She reports current alcohol use of about 1.0 standard drink of alcohol per week. She reports that she does not use drugs.   Family History:  The patient'sfamily history includes Anxiety disorder in her maternal aunt and mother; Dementia in her mother; Depression in her mother; Diabetes in her mother; Drug abuse in her cousin; Heart disease in her mother; Heart failure in her mother; High Cholesterol in her father and mother; Hyperlipidemia in her mother; Hypertension in her father and mother; Mood Disorder in her sister; Paranoid behavior in her mother; Stroke in her mother and sister; Tuberculosis in her paternal grandfather.   ROS:  Please see the history of present illness.   Otherwise, review of systems are positive for none.   All other systems are reviewed and negative.   PHYSICAL EXAM: VS:  BP (!) 170/84   Pulse 92   Ht _0  (1.6 m)   Wt 171 lb 6.4 oz (77.7 kg)   SpO2 96%   BMI 30.36 kg/m  , BMI Body mass index is 30.36 kg/m.  General: Well developed, well nourished, NAD Neck: Negative for carotid bruits. No JVD Lungs: Diminished in lower lobes but no wheezes, rales, or rhonchi. Breathing is unlabored. Cardiovascular: RRR with S1 S2. No murmurs Extremities: Mild edema.  Neuro: Alert and oriented. No focal deficits. No facial asymmetry. MAE spontaneously. Psych: Responds to questions appropriately with normal affect.     EKG:  EKG is not ordered today.  Recent Labs: 04/18/2020: ALT 19 08/18/2020: TSH 1.63 09/14/2020: Hemoglobin 12.6; Platelets 344.0 09/27/2020: BUN 18; Creatinine, Ser 1.14; Potassium 4.1; Sodium 136    Lipid Panel    Component Value Date/Time   CHOL 112 04/18/2020 0815   TRIG 65 04/18/2020 0815   HDL 39 (L) 04/18/2020 0815   CHOLHDL 2.9 04/18/2020 0815   CHOLHDL 3.7 03/31/2020 0514   VLDL 15 03/31/2020 0514   LDLCALC 59 04/18/2020 0815   LDLDIRECT 78.0 02/07/2018 0928     Wt Readings from Last 3 Encounters:  10/14/20  171 lb 6.4 oz (77.7  kg)  09/14/20 165 lb 9.6 oz (75.1 kg)  06/27/20 170 lb (77.1 kg)    Other studies Reviewed: Additional studies/ records that were reviewed today include:   Myoview 7/21:  EF 20, no ischemia; high risk   Echocardiogram 03/31/20 (w bubble study) EF < 20, global HK, mod LVH, mod LAE, mod MR, RVSP 41.4, bubble study neg  Head and neck CTA 03/31/20 1. CT head negative for acute abnormality. Small acute infarct in the left thalamus seen on MRI last night is not visualized by CT. No intracranial hemorrhage 2. Atherosclerotic aortic arch and proximal great vessels. Atherosclerotic disease in the carotid bifurcation bilaterally without significant stenosis. 3. Moderate to severe stenosis proximal right vertebral artery which is then patent to the basilar. Severe stenosis proximal left vertebral artery which is diffusely diseased and small but does contribute to the basilar. 4. Occlusion left P3 segment. Moderate stenosis left P2 segment 5. Anterior circulation widely patent. 6. Multiple thyroid nodules. The largest nodule on the left is 20 mm. Thyroid ultrasound recommended. (Ref: J Am Coll Radiol. 2015 Feb;12(2): 143-50).  Carotid US 03/31/20 R 50-69 (no sig dz on CTA)  Echo 06/30/18 Mild conc LVH, EF 30-35, ant-sept/inf/inf-sept HK, Gr 1 DD, mild to mod AI, MAC, mobile density on MV unchanged, mild MR, mild TR, PASP 39  Echo 01/30/17 Diff HK, mild focal basal septal hypertrophy, EF 30-35, mild AI, MAC, mild MR  Echo 06/08/16 Mild focal basal septal hypertrophy, EF 25-30%, diff HK, ant-septal AK, Gr 1 DD, mild AI, MAC, mild MR, PASP 37 mmHg  Echo 03/18/16 EF 30-35%, ant-septal AK, Gr 1 DD, mild MR, severe LAE.  LHC 03/17/16 LAD proximal 80%, mid 80%, distal 50%, ostial D1 60%  LCx with LPDA lesion, 30%  RCA Mild calcification with no significant stenosis in a medium caliber, nondominant RCA LVEF is estimated at 45% with inferoapical and lateral wall akinesis PCI: PCI: 3.5 x  24 mm Promus DES to prox LAD, 2.5 x 12 mm Promus DES to mid   ASSESSMENT AND PLAN:  1. Chronic systolic CHF/severe ischemic cardiomyopathy: -Presented with symptoms concerning for fluid volume overload with exertional SOB and orthopnea symptoms. She has had no anginal symptoms or equivalent. She reports poor compliance with Lasix therapy. Given her hx there is suspiscion for CAD progression however we will up titrate her Lasix and re-evaluate symptoms for now and follow closely. I stressed the importance of medication compliance>>she understand and agrees.  -Most recent echocardiogram from recent hospitalization with LVEF <20%>>previously recommended that she undergo CRT therapy but she deferred at this time  -She is now accepting to CRT therapy and therefore is being referred to [name] for work up -Increase Lasix to 21m QDx6 days then resume prior dosing. We will follow labs thereafter  -She will continue Imdur at current dose however may need up titration at follow up   2. LBBB: -Known severe LV dysfunction per last echo at <20%>>see plan as above  3. Coronary artery disease without angina: -s/p anterior STEMI in 5/17 with DES/PCI x 2 to the LAD.  -Most recent stress test without ischemia. -She is having symptoms suggestive of fluid volume overload with orthopnea and exertional SOB. We will adjust Lasix dosing as above and see her for close follow up as we cannot fully exclude progression of CAD>>may need further ischemic evaluation if no improvement -Continue Plavix, statin, carvedilol and Imdur   4. HTN: -Elevated today however patient reports  that she has not yet taken her morning medications -Will follow closely at next OV -Continue current therapy.   5. Mixed hyperlipidemia: -Last LDL, 77 on 03/31/20 -LDL goal <70 -Continue current regimen and follow labs   Current medicines are reviewed at length with the patient today.  The patient does not have concerns regarding  medicines.  The following changes have been made:  Increase Lasix to 5m PO QD x6 days then resume MWF schedule with 276mon all other days.   Labs/ tests ordered today include: BMET, BNP  Orders Placed This Encounter  Procedures  . Pro b natriuretic peptide (BNP)  . Basic metabolic panel  . Ambulatory referral to Cardiac Electrophysiology   Disposition:   FU with myself or APP in 4 weeks  Signed, JiKathyrn DrownNP  10/14/2020 12:54 PM    [email or web address]  [id number]: [phone]: [phone]

## 2020-10-12 NOTE — Telephone Encounter (Signed)
Pt called and said that she got her results back last night from her Covid test and it was neg She is having congestion, cough and fatigue she wanted an in office appt. I told her with the symptoms  she is having she wouldn't be able to come in.Melanie Ray a VV  Pt stated that she would go to an UC to be seen in person

## 2020-10-13 ENCOUNTER — Other Ambulatory Visit: Payer: Self-pay | Admitting: Family Medicine

## 2020-10-13 DIAGNOSIS — R944 Abnormal results of kidney function studies: Secondary | ICD-10-CM

## 2020-10-13 NOTE — Telephone Encounter (Signed)
I called and LVM informing the patient that we were checking on her to see if she went to urgent care and if she needed anything.  Karlen Barbar,cma

## 2020-10-14 ENCOUNTER — Other Ambulatory Visit: Payer: Self-pay

## 2020-10-14 ENCOUNTER — Telehealth: Payer: Self-pay | Admitting: Family Medicine

## 2020-10-14 ENCOUNTER — Encounter: Payer: Self-pay | Admitting: Cardiology

## 2020-10-14 ENCOUNTER — Ambulatory Visit (INDEPENDENT_AMBULATORY_CARE_PROVIDER_SITE_OTHER): Payer: Medicare Other | Admitting: Cardiology

## 2020-10-14 VITALS — BP 170/84 | HR 92 | Ht 63.0 in | Wt 171.4 lb

## 2020-10-14 DIAGNOSIS — I2510 Atherosclerotic heart disease of native coronary artery without angina pectoris: Secondary | ICD-10-CM

## 2020-10-14 DIAGNOSIS — I255 Ischemic cardiomyopathy: Secondary | ICD-10-CM | POA: Diagnosis not present

## 2020-10-14 DIAGNOSIS — I5022 Chronic systolic (congestive) heart failure: Secondary | ICD-10-CM

## 2020-10-14 DIAGNOSIS — R0602 Shortness of breath: Secondary | ICD-10-CM

## 2020-10-14 DIAGNOSIS — E782 Mixed hyperlipidemia: Secondary | ICD-10-CM | POA: Diagnosis not present

## 2020-10-14 DIAGNOSIS — I447 Left bundle-branch block, unspecified: Secondary | ICD-10-CM | POA: Diagnosis not present

## 2020-10-14 MED ORDER — ISOSORBIDE MONONITRATE ER 30 MG PO TB24: 30.0000 mg | ORAL_TABLET | Freq: Every day | ORAL | 3 refills | Status: DC

## 2020-10-14 MED ORDER — FUROSEMIDE 40 MG PO TABS: ORAL_TABLET | ORAL | 3 refills | Status: DC

## 2020-10-14 NOTE — Telephone Encounter (Signed)
Patient called and said her traZODone (DESYREL) 100 MG tablet is not working, she is only getting 2 to 3 hours of sleep a night. She needs something.

## 2020-10-14 NOTE — Telephone Encounter (Signed)
LVM informing the patient to call back if she needing anything.  Melanie Ray,cma

## 2020-10-14 NOTE — Patient Instructions (Signed)
Medication Instructions:  1. INCREASE LASIX TO 40 MG DAILY FOR 6 DAYS THEN RESUME PRIOR DOSING AT 40 MG Monday, Wednesday, Friday AND 20 MG EVERY OTHER DAY.  *If you need a refill on your cardiac medications before your next appointment, please call your pharmacy*   Lab Work: TODAY: PRO BNP, BMET If you have labs (blood work) drawn today and your tests are completely normal, you will receive your results only by: [name] MyChart Message (if you have MyChart) OR . A paper copy in the mail If you have any lab test that is abnormal or we need to change your treatment, we will call you to review the results.   Testing/Procedures: NONE   Follow-Up: At [facility], you and your health needs are our priority.  As part of our continuing mission to provide you with exceptional heart care, we have created designated Provider Care Teams.  These Care Teams include your primary Cardiologist (physician) and Advanced Practice Providers (APPs -  Physician Assistants and Nurse Practitioners) who all work together to provide you with the care you need, when you need it.  We recommend signing up for the patient portal called "MyChart".  Sign up information is provided on this After Visit Summary.  MyChart is used to connect with patients for Virtual Visits (Telemedicine).  Patients are able to view lab/test results, encounter notes, upcoming appointments, etc.  Non-urgent messages can be sent to your provider as well.   To learn more about what you can do with MyChart, go to [email or web address].    Your next appointment:   2-3 week(s)  The format for your next appointment:   In Person  Provider:   You will see one of the following Advanced Practice Providers on your designated Care Team:    Richardson Dopp, PA-C  [place], [place]  Kathyrn Drown, NP  You have been referred to to see [name], an electrophysiologist.

## 2020-10-15 LAB — BASIC METABOLIC PANEL
BUN/Creatinine Ratio: 14 (ref 12–28)
BUN: 14 mg/dL (ref 8–27)
CO2: 20 mmol/L (ref 20–29)
Calcium: 10 mg/dL (ref 8.7–10.3)
Chloride: 106 mmol/L (ref 96–106)
Creatinine, Ser: 0.99 mg/dL (ref 0.57–1.00)
GFR calc Af Amer: 67 mL/min/{1.73_m2} (ref >59)
GFR calc non Af Amer: 58 mL/min/{1.73_m2} — ABNORMAL LOW (ref >59)
Glucose: 78 mg/dL (ref 65–99)
Potassium: 4.3 mmol/L (ref 3.5–5.2)
Sodium: 140 mmol/L (ref 134–144)

## 2020-10-15 LAB — PRO B NATRIURETIC PEPTIDE: NT-Pro BNP: 7853 pg/mL — ABNORMAL HIGH (ref 0–301)

## 2020-10-16 NOTE — Telephone Encounter (Signed)
The patient's trazodone is prescribed by her psychiatrist. She should discuss this with them.

## 2020-10-17 ENCOUNTER — Other Ambulatory Visit: Payer: Self-pay

## 2020-10-17 DIAGNOSIS — Z79899 Other long term (current) drug therapy: Secondary | ICD-10-CM

## 2020-10-17 NOTE — Telephone Encounter (Signed)
Patient has been notified. She stated she will call her psychiatrist.

## 2020-10-21 ENCOUNTER — Ambulatory Visit
Admission: RE | Admit: 2020-10-21 | Discharge: 2020-10-21 | Disposition: A | Discharge status: Home / Self Care | Payer: Medicare Other | Source: Ambulatory Visit | Attending: Family Medicine | Admitting: Family Medicine

## 2020-10-21 ENCOUNTER — Other Ambulatory Visit: Payer: Self-pay

## 2020-10-21 ENCOUNTER — Telehealth: Payer: Self-pay | Admitting: Cardiovascular Disease

## 2020-10-21 DIAGNOSIS — R944 Abnormal results of kidney function studies: Secondary | ICD-10-CM | POA: Insufficient documentation

## 2020-10-21 DIAGNOSIS — R7989 Other specified abnormal findings of blood chemistry: Secondary | ICD-10-CM | POA: Diagnosis not present

## 2020-10-21 IMAGING — US US RENAL
1 series · 14 of 25 positions shown · non-contrast
Comparison: [DATE]

CLINICAL DATA: Increased creatinine, decreased GFR

EXAM:
RENAL / URINARY TRACT ULTRASOUND COMPLETE

[Series 1: us renal · 14 of 40 slices shown]
[im 1/40]
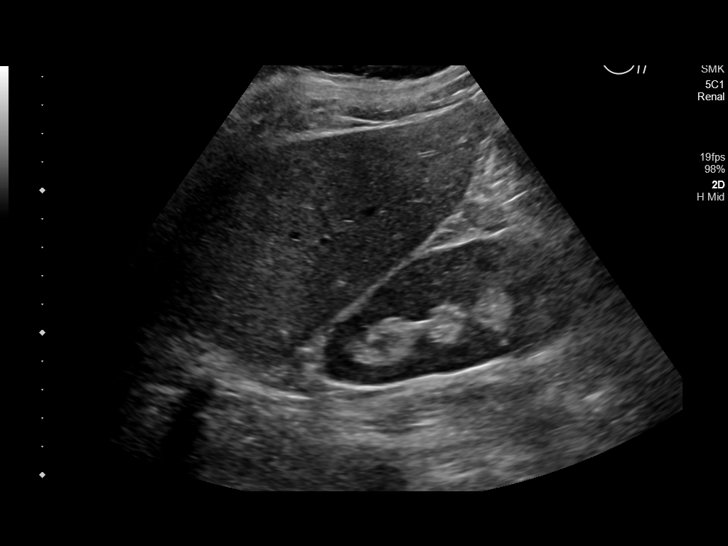
[im 4/40]
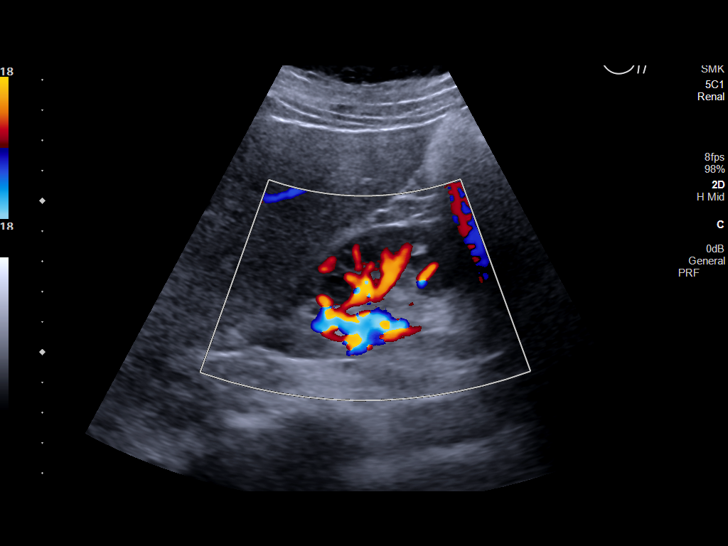
[im 7/40]
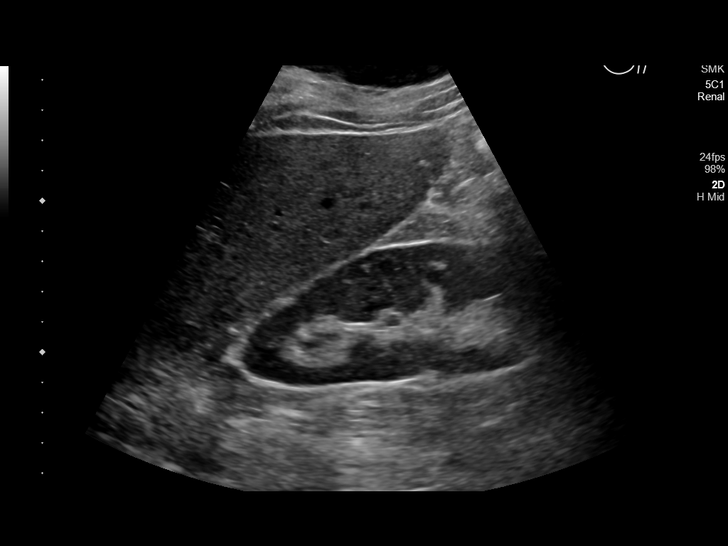
[im 10/40]
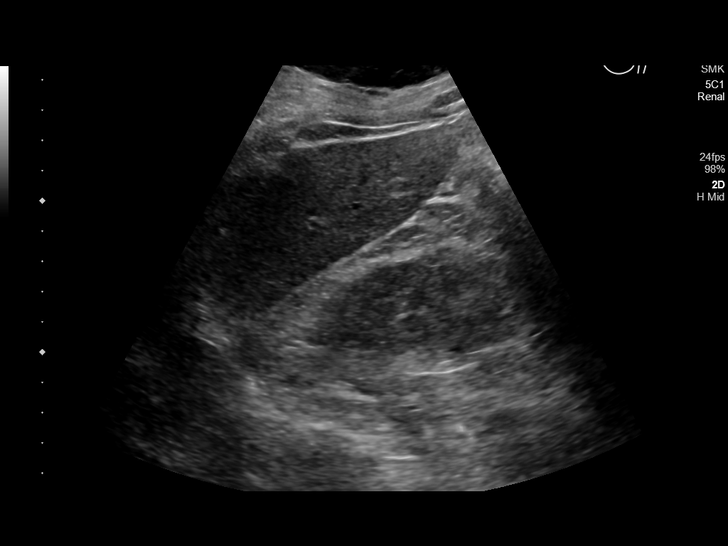
[im 14/40]
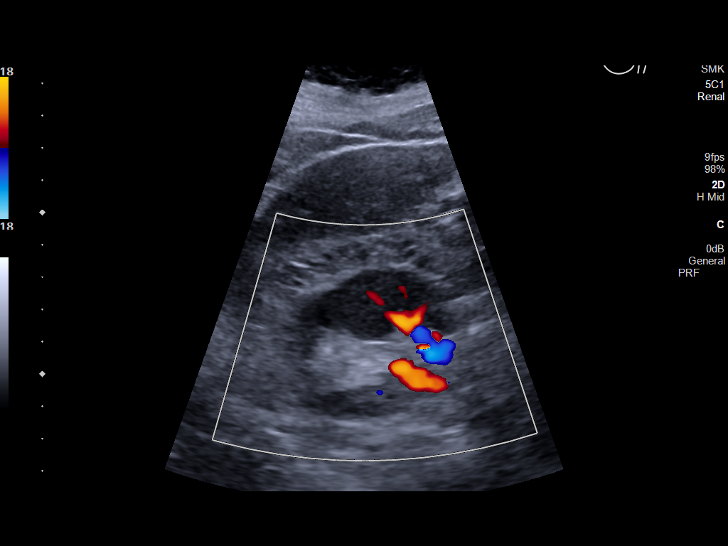
[im 15/40]
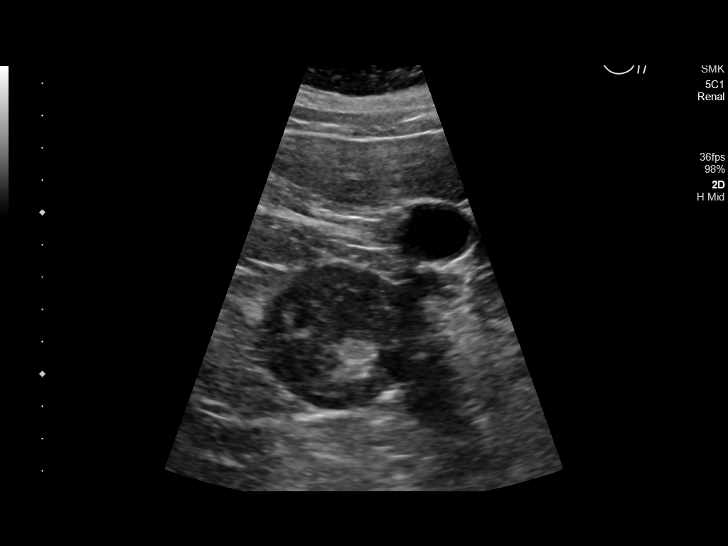
[im 18/40]
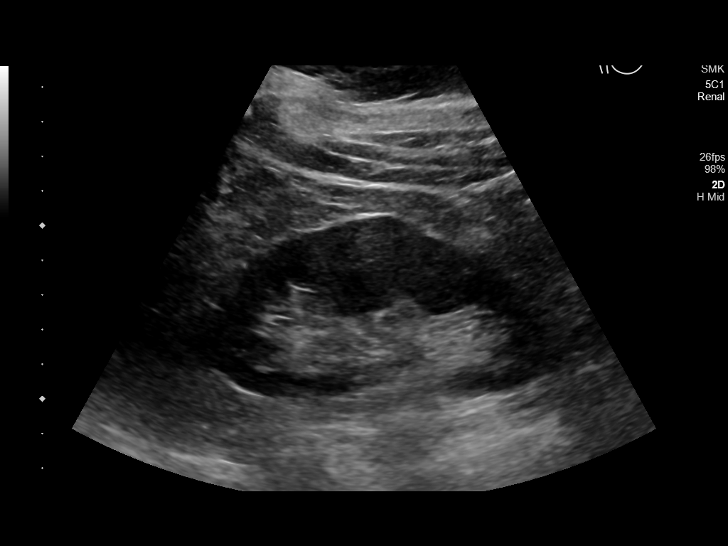
[im 22/40]
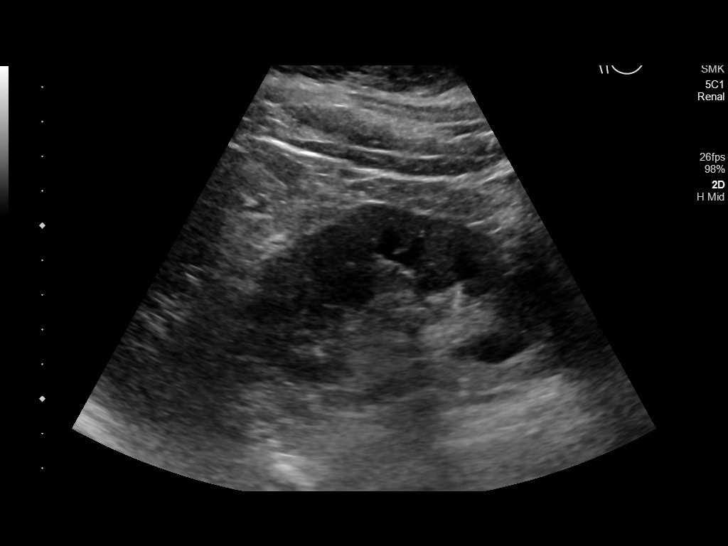
[im 25/40]
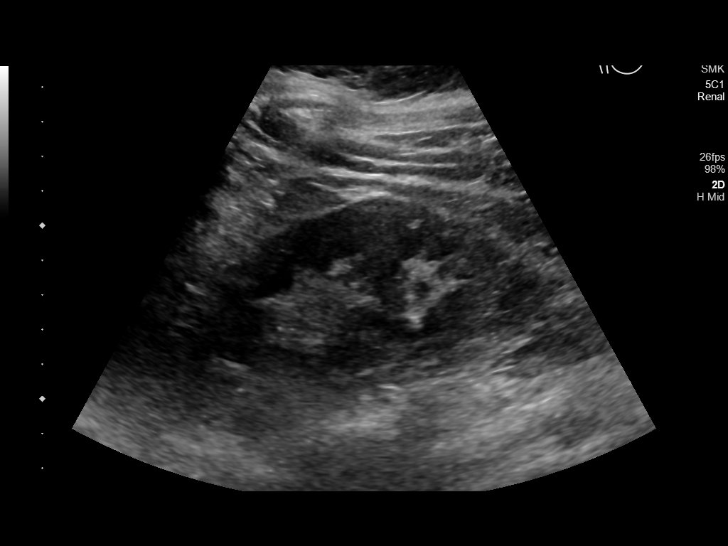
[im 27/40]
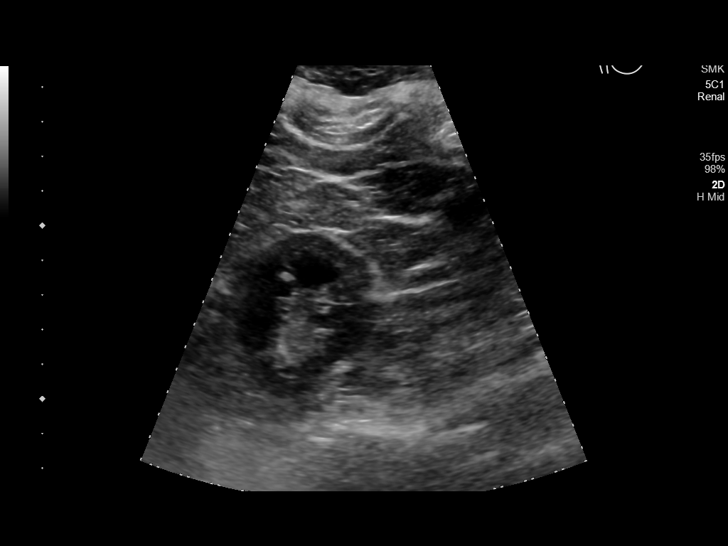
[im 30/40]
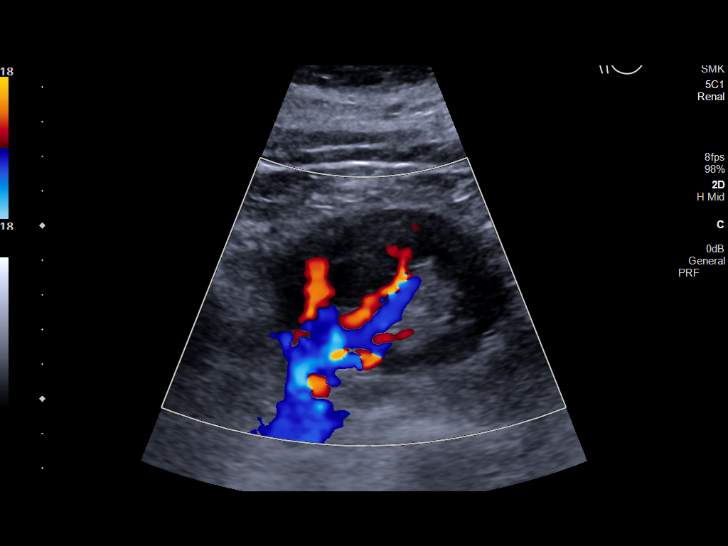
[im 33/40]
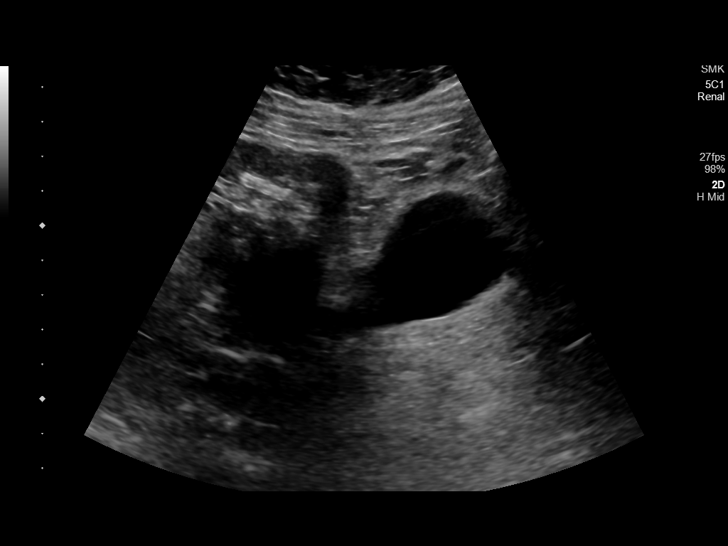
[im 36/40]
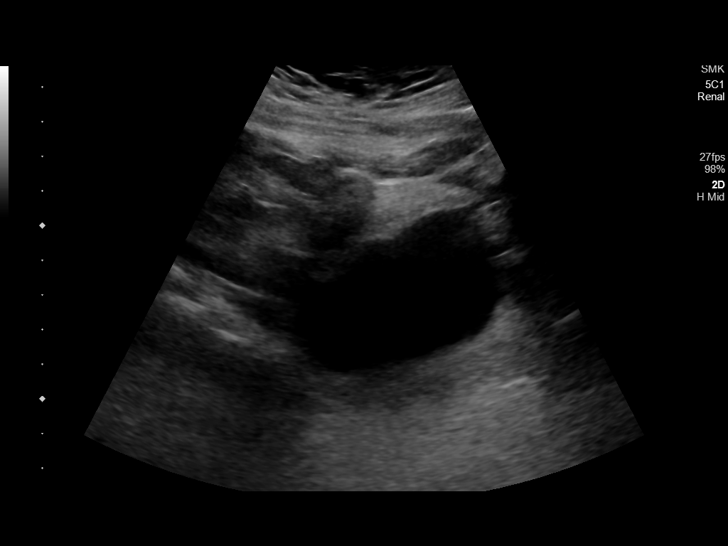
[im 40/40]
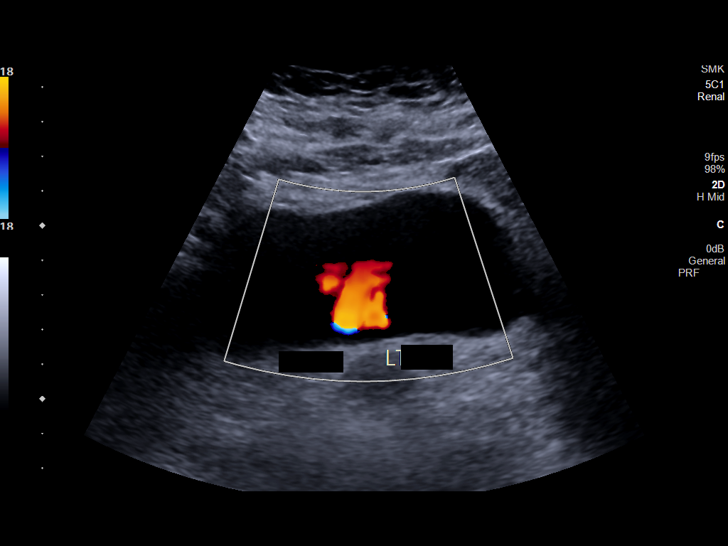

[14 of 25 positions shown; findings below may reference images not displayed]

FINDINGS: Right Kidney:

Renal measurements: 10.3 x 4.1 x 5.2 cm = volume: 115.5 mL.
Echogenicity within normal limits. No mass or hydronephrosis
visualized.

Left Kidney:

Renal measurements: 10.7 x 5.0 x 4.9 cm = volume: 136.6 mL.
Echogenicity within normal limits. No mass or hydronephrosis
visualized.

Bladder:

Appears normal for degree of bladder distention.

Other:

None.
IMPRESSION: 1. Unremarkable renal ultrasound.

## 2020-10-21 NOTE — Telephone Encounter (Signed)
New message:     Patient calling stating that she is on furosemide and she think that is might be making her weak.

## 2020-10-21 NOTE — Telephone Encounter (Addendum)
Pt feeling off balance, dizzy and low energy. She finished her increase Lasix dosing yesterday (40 mg QD x 6 days).  Normal dosing is 40 mg M/W/F and 20 mg the other days -- but she has non-compliancy with this.   Reports SOB "tremendously better" and that the Lasix "took it away".  Reminded/educated importance of compliancy with Lasix daily. Aware that symptoms should subside with returning to normal dosing, to give it a day/two and monitor. She is unable to weigh herself or take blood pressures. Reviewed with Dr. York Cerise nurse. Pt advised to take Lasix 20 mg today and return to normal dosing tomorrow. Advised to call office back if worsens over the weekend. Also aware that her low energy (which is unchanged prior to recent OV) is most likely due to her weakened heart muscle.  Advised to call EP scheduler back to make appt w/ [name] to re-discuss CRTD  Implant need.  Pt agreeable to plan.

## 2020-10-31 ENCOUNTER — Other Ambulatory Visit: Payer: Self-pay | Admitting: Family Medicine

## 2020-11-02 ENCOUNTER — Encounter: Payer: Self-pay | Admitting: *Deleted

## 2020-11-02 ENCOUNTER — Other Ambulatory Visit: Payer: Self-pay

## 2020-11-02 DIAGNOSIS — F331 Major depressive disorder, recurrent, moderate: Secondary | ICD-10-CM

## 2020-11-02 MED ORDER — VENLAFAXINE HCL ER 150 MG PO CP24
ORAL_CAPSULE | ORAL | 0 refills | Status: DC
Start: 2020-11-02 — End: 2022-11-28

## 2020-11-02 NOTE — Telephone Encounter (Signed)
I sent a 30 day supply in for her. She needs to see her psychiatrist for further refills. She needs to contact Dr Elvera Maria office to set up follow-up.

## 2020-11-08 NOTE — Progress Notes (Signed)
Cardiology Office Note:    Date:  11/09/2020   ID:  Melanie Ray, Melanie Ray 1951/07/01, MRN [id number]  PCP:  Melanie Haven, MD  [facility] Cardiologist:  Melanie Mocha, MD   [facility] Electrophysiologist:  None   Referring MD: Melanie Haven, MD   Chief Complaint:  Follow-up (CHF)    Patient Profile:    Melanie Ray is a 70 y.o. female with:   Coronary artery disease  ? S/p Anterior STEMI 5/17 >> PCI: DES x 2 to LAD ? Myoview 7/21: EF 20, no ischemia  Heart failure with reduced ejection fraction  ? Ischemic CM ? EF 30-35 at time of MI >> DC on [facility] >> Pt DC'd at FU visit ? Echocardiogram 8/17: EF 25-30 ? Echocardiogram 3/18: EF 30-35 ? Echocardiogram 8/19: EF 30-35 ? EP eval (Melanie Ray) >> pt declined CRT-D ? Intol of Entresto ? Echocardiogram 03/2020: EF < 20  Obsessive Compulsive d/o  Diabetes mellitus 2  Hypertension   Hyperlipidemia   LBBB  OSA  Hepatic steatosis  Hx of cerebellar CVA 03/2020  Bilateral vertebral artery stenosis   Prior CV studies: Myoview 7/21:  EF 20, no ischemia; high risk   Echocardiogram 03/31/20 (w bubble study) EF < 20, global HK, mod LVH, mod LAE, mod MR, RVSP 41.4, bubble study neg  Head and neck CTA 03/31/20 1. CT head negative for acute abnormality. Small acute infarct in the left thalamus seen on MRI last night is not visualized by CT. No intracranial hemorrhage 2. Atherosclerotic aortic arch and proximal great vessels. Atherosclerotic disease in the carotid bifurcation bilaterally without significant stenosis. 3. Moderate to severe stenosis proximal right vertebral artery which is then patent to the basilar. Severe stenosis proximal left vertebral artery which is diffusely diseased and small but does contribute to the basilar. 4. Occlusion left P3 segment. Moderate stenosis left P2 segment 5. Anterior circulation widely patent. 6. Multiple thyroid nodules. The largest nodule on  the left is 20 mm. Thyroid ultrasound recommended. (Ref: [facility] Am Coll Radiol. 2015 Feb;12(2): 143-50).  Carotid US 03/31/20 R 50-69 (no sig dz on CTA)  Echo 06/30/18 Mild conc LVH, EF 30-35, ant-sept/inf/inf-sept HK, Gr 1 DD, mild to mod AI, MAC, mobile density on MV unchanged, mild MR, mild TR, PASP 39  Echo 01/30/17 Diff HK, mild focal basal septal hypertrophy, EF 30-35, mild AI, MAC, mild MR  Echo 06/08/16 Mild focal basal septal hypertrophy, EF 25-30%, diff HK, ant-septal AK, Gr 1 DD, mild AI, MAC, mild MR, PASP 37 mmHg  Echo 03/18/16 EF 30-35%, ant-septal AK, Gr 1 DD, mild MR, severe LAE.  LHC 03/17/16 LAD proximal 80%, mid 80%, distal 50%, ostial D1 60%  LCx with LPDA lesion, 30%  RCA Mild calcification with no significant stenosis in a medium caliber, nondominant RCA LVEF is estimated at 45% with inferoapical and lateral wall akinesis PCI: PCI: 3.5 x 24 mm Promus DES to prox LAD, 2.5 x 12 mm Promus DES to mid LAD.   History of Present Illness:    Melanie Ray was last seen by me in 8/21.  She has been hesitant to proceed with advanced therapies such as CRT.  She was last seen in clinic by Melanie Drown, NP 10/14/20.  She had symptoms of decompensated heart failure.  Her furosemide was adjusted.  She was interested in referral back to EP for possible CRT.  She has an appointment with Melanie Ray next week.  She returns for follow-up.  She  is here today with her husband.  Her shortness of breath at night has improved since increasing her furosemide.  She still notes shortness of breath with some activities.  She has not had further orthopnea.  She has not had leg edema.  She has not had syncope.  She has not had chest pain.  She does note chronic dizziness.  This occurs with standing and resolves with sitting.  This has been fairly chronic for quite some time but has gotten worse recently.      Past Medical History:  Diagnosis Date  . Anemia   . Anxiety   . Back pain   . CAD  (coronary artery disease) 04/11/2016   S/p ant STEMI 5/17: LHC >> LAD proximal 80%, mid 80%, distal 50%, ostial D1 60%; LCx with LPDA lesion 30%; RCA Mild calcification with no significant stenosis in a medium caliber, nondominant RCA; LVEF is estimated at 45% with inferoapical and lateral wall akinesis >> PCI: PCI: 3.5 x 24 mm Promus DES to prox LAD, 2.5 x 12 mm Promus DES to mid LAD. // Myoview 7/21: EF 20, no ischemia; high risk   . Chest pain   . Chronic systolic CHF (congestive heart failure) ([place]) 03/21/2016   Echo 01/30/17: Diff HK, mild focal basal septal hypertrophy, EF 30-35, mild AI, MAC, mild MR // Echo 06/08/16: Mild focal basal septal hypertrophy, EF 25-30%, diff HK, ant-septal AK, Gr 1 DD, mild AI, MAC, mild MR, PASP 37 mmHg // Echo 03/18/16: EF 30-35%, ant-septal AK, Gr 1 DD, mild MR, severe LAE.  [name] Constipation   . Depression   . Diabetes mellitus type 2 in obese ([place])   . Dizziness   . Glaucoma   . Gout   . Heart disease   . Heartburn   . History of acute anterior wall MI 03/17/2016  . History of heart attack   . Hyperlipemia   . Hypertension   . Ischemic cardiomyopathy 10/02/2016   Refused ICD  . Joint pain   . Left bundle branch block   . Myocardial infarction ([place])   . Nausea   . Sleep apnea   . SOB (shortness of breath)   . Suicidal ideation 08/27/2018  . Swelling    feet or legs    Current Medications: Current Meds  Medication Sig  . albuterol (VENTOLIN HFA) 108 (90 Base) MCG/ACT inhaler Inhale 2 puffs into the lungs every 6 (six) hours as needed for shortness of breath.   [name] atorvastatin (LIPITOR) 80 MG tablet Take 1 tablet by mouth once daily  . blood glucose meter kit and supplies KIT Dispense based on patient and insurance preference.  Check glucose once daily fasting in the morning. (FOR ICD 10 E11.9).  . carvedilol (COREG) 12.5 MG tablet TAKE 1 TABLET BY MOUTH TWICE DAILY WITH MEALS  . clopidogrel (PLAVIX) 75 MG tablet Take 1 tablet (75 mg total) by mouth  daily.  . dapagliflozin propanediol ([name]) 10 MG TABS tablet Take 1 tablet (10 mg total) by mouth daily before breakfast.  . dorzolamide-timolol (COSOPT) 22.3-6.8 MG/ML ophthalmic solution Place 1 drop into both eyes 2 (two) times daily.   . furosemide (LASIX) 40 MG tablet 40 MG Monday, Wednesday, Friday AND 20 MG EVERY OTHER DAY.  . hydrOXYzine (ATARAX/VISTARIL) 10 MG tablet Take 1 tablet (10 mg total) by mouth 3 (three) times daily as needed.  . isosorbide mononitrate (IMDUR) 30 MG 24 hr tablet Take 1 tablet (30 mg total) by mouth daily.  [name]  latanoprost (XALATAN) 0.005 % ophthalmic solution Place 1 drop into both eyes at bedtime.  [name] lurasidone (LATUDA) 40 MG TABS tablet Take 40 mg by mouth daily with breakfast.  . metFORMIN (GLUCOPHAGE) 500 MG tablet TAKE 1 TABLET BY MOUTH TWICE DAILY WITH A MEAL  . nitroGLYCERIN (NITROSTAT) 0.4 MG SL tablet DISSOLVE ONE TABLET UNDER THE TONGUE EVERY 5 MINUTES AS NEEDED FOR CHEST PAIN.  DO NOT EXCEED A TOTAL OF 3 DOSES IN 15 MINUTES  . OLANZapine (ZYPREXA) 5 MG tablet Take 1 tablet (5 mg total) by mouth at bedtime. For anxiety and sleep  . olmesartan (BENICAR) 20 MG tablet Take 1 tablet (20 mg total) by mouth daily.  [name] spironolactone (ALDACTONE) 25 MG tablet Take 1/2 (one-half) tablet by mouth once daily  . traZODone (DESYREL) 100 MG tablet Take 100 mg by mouth at bedtime as needed for sleep.  [name] venlafaxine XR (EFFEXOR-XR) 150 MG 24 hr capsule TAKE 2 CAPSULES BY MOUTH ONCE DAILY (NEEDS to follow-up with psychiatry for further refills)     Allergies:   Tetracycline and Meperidine   Social History   Tobacco Use  . Smoking status: Former Smoker    Types: Cigarettes    Quit date: 04/13/1997    Years since quitting: 23.5  . Smokeless tobacco: Never Used  Vaping Use  . Vaping Use: Never used  Substance Use Topics  . Alcohol use: Yes    Alcohol/week: 1.0 standard drink    Types: 1 Glasses of wine per week    Comment: 2-3 glasses of wine per month  . Drug  use: No     Family Hx: The patient's family history includes Anxiety disorder in her maternal aunt and mother; Dementia in her mother; Depression in her mother; Diabetes in her mother; Drug abuse in her cousin; Heart disease in her mother; Heart failure in her mother; High Cholesterol in her father and mother; Hyperlipidemia in her mother; Hypertension in her father and mother; Mood Disorder in her sister; Paranoid behavior in her mother; Stroke in her mother and sister; Tuberculosis in her paternal grandfather.  ROS   EKGs/Labs/Other Test Reviewed:    EKG:  EKG is not ordered today.  The ekg ordered today demonstrates n/a  Recent Labs: 04/18/2020: ALT 19 08/18/2020: TSH 1.63 09/14/2020: Hemoglobin 12.6; Platelets 344.0 10/14/2020: BUN 14; Creatinine, Ser 0.99; NT-Pro BNP 7,853; Potassium 4.3; Sodium 140   Recent Lipid Panel Lab Results  Component Value Date/Time   CHOL 112 04/18/2020 08:15 AM   TRIG 65 04/18/2020 08:15 AM   HDL 39 (L) 04/18/2020 08:15 AM   CHOLHDL 2.9 04/18/2020 08:15 AM   CHOLHDL 3.7 03/31/2020 05:14 AM   LDLCALC 59 04/18/2020 08:15 AM   LDLDIRECT 78.0 02/07/2018 09:28 AM      Risk Assessment/Calculations:      Physical Exam:    VS:  BP 132/72   Pulse 69   Ht _0  (1.6 m)   Wt 168 lb 9.6 oz (76.5 kg)   SpO2 97%   BMI 29.87 kg/m     Wt Readings from Last 3 Encounters:  11/09/20 168 lb 9.6 oz (76.5 kg)  10/14/20 171 lb 6.4 oz (77.7 kg)  09/14/20 165 lb 9.6 oz (75.1 kg)     Constitutional:      Appearance: Healthy appearance. Not in distress.  Neck:     Vascular: No JVR. JVD normal.  Pulmonary:     Effort: Pulmonary effort is normal.     Breath sounds: No  wheezing. No rales.  Cardiovascular:     Normal rate. Regular rhythm. Normal S1. Normal S2.     Murmurs: There is no murmur.  Edema:    Peripheral edema absent.  Abdominal:     Palpations: Abdomen is soft. There is no hepatomegaly.  Skin:    General: Skin is warm and dry.   Neurological:     Mental Status: Alert and oriented to person, place and time.     Cranial Nerves: Cranial nerves are intact.       ASSESSMENT & PLAN:    1. HFrEF (heart failure with reduced ejection fraction) (HCC) EF <20.  NYHA III.  I suspect her dizziness is all related to heart failure.  I did check her blood pressure sitting and standing.  She did not have a blood pressure drop.  Her pressure actually went up 10 points (sitting >> standing).  Her heart rate remained approximately 80 sitting and standing.  We discussed further maximization of her heart failure medications.  She has tried to get Delene Loll but has been told that she cannot use the prescription card and it would be $400.  I did reach out to our pharmacist to see if she can provide any help with her obtaining Entresto.  We have attempted this in the past and we have not been able to get her assistance.  Unfortunately, it appears the cost of this drug will be prohibitive.  For now, continue olmesartan, spironolactone, carvedilol.  We discussed the benefits of SGLT2 inhibitors in patients with heart failure with reduced ejection fraction.  We discussed potential side effects including GU infection.  Start dapagliflozin 10 mg daily.  Obtain BMET today.  I think she may benefit from a referral to our advanced heart failure clinic.  I will make that referral.  I will see her back in 3 weeks.  She sees Melanie Ray next week to discuss CRT.  I have encouraged her to proceed with this as I think it will improve her symptoms.  2. Coronary artery disease involving native coronary artery of native heart without angina pectoris Hx of anterior STEMI in 5/17 tx with DES x 2 to the LAD.   Myoview 7/21 low risk.  She is not having anginal symptoms.  Continue atorvastatin, clopidogrel, carvedilol.  3. Essential hypertension Fair control.  She certainly has enough blood pressure to further titrate her medications for CHF.  Medication adjustments as  outlined above.   Dispo:  Return in about 3 weeks (around 11/30/2020) for Close Follow Up, w/ Richardson Dopp, PA-C, in person.   Medication Adjustments/Labs and Tests Ordered: Current medicines are reviewed at length with the patient today.  Concerns regarding medicines are outlined above.  Tests Ordered: Orders Placed This Encounter  Procedures  . Basic metabolic panel  . AMB referral to CHF clinic   Medication Changes: Meds ordered this encounter  Medications  . dapagliflozin propanediol ([name]) 10 MG TABS tablet    Sig: Take 1 tablet (10 mg total) by mouth daily before breakfast.    Dispense:  30 tablet    Refill:  6    Signed, Richardson Dopp, PA-C  11/09/2020 5:10 PM    [facility] [address], [place], [place]  [address] Phone: [phone]; Fax: [phone]

## 2020-11-09 ENCOUNTER — Encounter: Payer: Self-pay | Admitting: Physician Assistant

## 2020-11-09 ENCOUNTER — Other Ambulatory Visit: Payer: Self-pay

## 2020-11-09 ENCOUNTER — Ambulatory Visit: Payer: Medicare Other | Admitting: Physician Assistant

## 2020-11-09 ENCOUNTER — Ambulatory Visit (INDEPENDENT_AMBULATORY_CARE_PROVIDER_SITE_OTHER): Payer: Medicare Other | Admitting: Physician Assistant

## 2020-11-09 VITALS — BP 132/72 | HR 69 | Ht 63.0 in | Wt 168.6 lb

## 2020-11-09 DIAGNOSIS — E782 Mixed hyperlipidemia: Secondary | ICD-10-CM

## 2020-11-09 DIAGNOSIS — I10 Essential (primary) hypertension: Secondary | ICD-10-CM | POA: Diagnosis not present

## 2020-11-09 DIAGNOSIS — I5020 Unspecified systolic (congestive) heart failure: Secondary | ICD-10-CM | POA: Diagnosis not present

## 2020-11-09 DIAGNOSIS — I2510 Atherosclerotic heart disease of native coronary artery without angina pectoris: Secondary | ICD-10-CM

## 2020-11-09 MED ORDER — DAPAGLIFLOZIN PROPANEDIOL 10 MG PO TABS: 10.0000 mg | ORAL_TABLET | Freq: Every day | ORAL | 6 refills | Status: DC

## 2020-11-09 NOTE — Patient Instructions (Addendum)
Medication Instructions:  Your physician has recommended you make the following change in your medication:   1) Start [name] 10 mg, 1 tablet by mouth once a day  *If you need a refill on your cardiac medications before your next appointment, please call your pharmacy*  Lab Work: You will have labs drawn today: BMET  Testing/Procedures: None ordered today  Follow-Up: At [facility], you and your health needs are our priority.  As part of our continuing mission to provide you with exceptional heart care, we have created designated Provider Care Teams.  These Care Teams include your primary Cardiologist (physician) and Advanced Practice Providers (APPs -  Physician Assistants and Nurse Practitioners) who all work together to provide you with the care you need, when you need it.  Your next appointment:   3 week(s) 11/30/20 at 2:15PM   The format for your next appointment:   In Person  Provider:   Tereso Newcomer, PA-C

## 2020-11-10 LAB — BASIC METABOLIC PANEL
BUN/Creatinine Ratio: 16 (ref 12–28)
BUN: 18 mg/dL (ref 8–27)
CO2: 19 mmol/L — ABNORMAL LOW (ref 20–29)
Calcium: 9.8 mg/dL (ref 8.7–10.3)
Chloride: 105 mmol/L (ref 96–106)
Creatinine, Ser: 1.16 mg/dL — ABNORMAL HIGH (ref 0.57–1.00)
GFR calc Af Amer: 55 mL/min/{1.73_m2} — ABNORMAL LOW (ref >59)
GFR calc non Af Amer: 48 mL/min/{1.73_m2} — ABNORMAL LOW (ref >59)
Glucose: 75 mg/dL (ref 65–99)
Potassium: 4.5 mmol/L (ref 3.5–5.2)
Sodium: 140 mmol/L (ref 134–144)

## 2020-11-14 ENCOUNTER — Other Ambulatory Visit: Payer: Self-pay

## 2020-11-14 ENCOUNTER — Encounter: Payer: Self-pay | Admitting: Internal Medicine

## 2020-11-14 ENCOUNTER — Ambulatory Visit (INDEPENDENT_AMBULATORY_CARE_PROVIDER_SITE_OTHER): Payer: Medicare Other | Admitting: Internal Medicine

## 2020-11-14 VITALS — BP 102/64 | HR 75 | Ht 63.0 in | Wt 169.0 lb

## 2020-11-14 DIAGNOSIS — I255 Ischemic cardiomyopathy: Secondary | ICD-10-CM | POA: Diagnosis not present

## 2020-11-14 DIAGNOSIS — I5022 Chronic systolic (congestive) heart failure: Secondary | ICD-10-CM

## 2020-11-14 DIAGNOSIS — I447 Left bundle-branch block, unspecified: Secondary | ICD-10-CM | POA: Diagnosis not present

## 2020-11-14 NOTE — Patient Instructions (Signed)
Medication Instructions:  Your physician recommends that you continue on your current medications as directed. Please refer to the Current Medication list given to you today.  Labwork: None ordered.  Testing/Procedures: None ordered.  Follow-Up: Your physician wants you to follow-up in: as needed with Thompson Grayer, MD or one of the following Advanced Practice Providers on your designated Care Team:    Chanetta Marshall, NP  Tommye Standard, PA-C  Legrand Como "Jonni Sanger" Chalmers Cater, [place]   Any Other Special Instructions Will Be Listed Below (If Applicable).  If you need a refill on your cardiac medications before your next appointment, please call your pharmacy.

## 2020-11-14 NOTE — Progress Notes (Signed)
Electrophysiology Office Note   Date:  11/14/2020   ID:  Melanie Ray, Melanie Ray [birth year], MRN [id number]  PCP:  Leone Haven, MD  Cardiologist:  Dr Burt Knack Primary Electrophysiologist: Thompson Grayer, MD    CC: CHF/ LBBB   History of Present Illness: Melanie Ray is a 70 y.o. female who presents today for electrophysiology evaluation.   She is referred by Dr Burt Knack for risk stratification of sudden death.  I saw her for the same reason in 2017.  At that time, she declined ICD.  She had anterior STEMI 03/2016 with PCI performed.  She has done reasonably well.  She has been treated with guideline directed medical therapy but continues to have reduced EF. She has SOB with moderate activity but is active. Today, she denies symptoms of palpitations, chest pain, orthopnea, PND, lower extremity edema, claudication, dizziness, presyncope, syncope, bleeding, or neurologic sequela. The patient is tolerating medications without difficulties and is otherwise without complaint today.    Past Medical History:  Diagnosis Date  . Anemia   . Anxiety   . Back pain   . CAD (coronary artery disease) 04/11/2016   S/p ant STEMI 5/17: LHC >> LAD proximal 80%, mid 80%, distal 50%, ostial D1 60%; LCx with LPDA lesion 30%; RCA Mild calcification with no significant stenosis in a medium caliber, nondominant RCA; LVEF is estimated at 45% with inferoapical and lateral wall akinesis >> PCI: PCI: 3.5 x 24 mm Promus DES to prox LAD, 2.5 x 12 mm Promus DES to mid LAD. // Myoview 7/21: EF 20, no ischemia; high risk   . Chest pain   . Chronic systolic CHF (congestive heart failure) ([place]) 03/21/2016   Echo 01/30/17: Diff HK, mild focal basal septal hypertrophy, EF 30-35, mild AI, MAC, mild MR // Echo 06/08/16: Mild focal basal septal hypertrophy, EF 25-30%, diff HK, ant-septal AK, Gr 1 DD, mild AI, MAC, mild MR, PASP 37 mmHg // Echo 03/18/16: EF 30-35%, ant-septal AK, Gr 1 DD, mild MR, severe LAE.  [name]  Constipation   . Depression   . Diabetes mellitus type 2 in obese ([place])   . Dizziness   . Glaucoma   . Gout   . Heart disease   . Heartburn   . History of acute anterior wall MI 03/17/2016  . History of heart attack   . Hyperlipemia   . Hypertension   . Ischemic cardiomyopathy 10/02/2016   Refused ICD  . Joint pain   . Left bundle branch block   . Myocardial infarction ([place])   . Nausea   . Sleep apnea   . SOB (shortness of breath)   . Suicidal ideation 08/27/2018  . Swelling    feet or legs   Past Surgical History:  Procedure Laterality Date  . APPENDECTOMY    . CARDIAC CATHETERIZATION N/A 03/17/2016   Procedure: Left Heart Cath and Coronary Angiography;  Surgeon: Sherren Mocha, MD;  Location: [place];  Service: Cardiovascular;  Laterality: N/A;  . CARDIAC CATHETERIZATION N/A 03/17/2016   Procedure: Coronary Stent Intervention;  Surgeon: Sherren Mocha, MD;  Location: [facility];  Service: Cardiovascular;  Laterality: N/A;  . COLONOSCOPY WITH PROPOFOL N/A 08/13/2017   Procedure: COLONOSCOPY WITH PROPOFOL;  Surgeon: Jonathon Bellows, MD;  Location: [facility];  Service: Gastroenterology;  Laterality: N/A;  . COLONOSCOPY WITH PROPOFOL N/A 03/29/2020   Procedure: COLONOSCOPY WITH PROPOFOL;  Surgeon: Lucilla Lame, MD;  Location: [facility] ENDOSCOPY;  Service: Endoscopy;  Laterality: N/A;  .  SMALL BOWEL REPAIR    . TONSILLECTOMY    . UTERINE FIBROID SURGERY       Current Outpatient Medications  Medication Sig Dispense Refill  . albuterol (VENTOLIN HFA) 108 (90 Base) MCG/ACT inhaler Inhale 2 puffs into the lungs every 6 (six) hours as needed for shortness of breath.     [name] atorvastatin (LIPITOR) 80 MG tablet Take 1 tablet by mouth once daily 90 tablet 3  . blood glucose meter kit and supplies KIT Dispense based on patient and insurance preference.  Check glucose once daily fasting in the morning. (FOR ICD 10 E11.9). 1 each 0  . carvedilol (COREG) 12.5 MG tablet TAKE 1  TABLET BY MOUTH TWICE DAILY WITH MEALS 180 tablet 3  . clopidogrel (PLAVIX) 75 MG tablet Take 1 tablet (75 mg total) by mouth daily. 90 tablet 1  . dapagliflozin propanediol ([name]) 10 MG TABS tablet Take 1 tablet (10 mg total) by mouth daily before breakfast. 30 tablet 6  . dorzolamide-timolol (COSOPT) 22.3-6.8 MG/ML ophthalmic solution Place 1 drop into both eyes 2 (two) times daily.     . furosemide (LASIX) 40 MG tablet 40 MG Monday, Wednesday, Friday AND 20 MG EVERY OTHER DAY.    . hydrOXYzine (ATARAX/VISTARIL) 10 MG tablet Take 1 tablet (10 mg total) by mouth 3 (three) times daily as needed. 30 tablet 0  . isosorbide mononitrate (IMDUR) 30 MG 24 hr tablet Take 1 tablet (30 mg total) by mouth daily. 90 tablet 3  . latanoprost (XALATAN) 0.005 % ophthalmic solution Place 1 drop into both eyes at bedtime.    [name] lurasidone (LATUDA) 40 MG TABS tablet Take 40 mg by mouth daily with breakfast.    . metFORMIN (GLUCOPHAGE) 500 MG tablet TAKE 1 TABLET BY MOUTH TWICE DAILY WITH A MEAL 180 tablet 0  . nitroGLYCERIN (NITROSTAT) 0.4 MG SL tablet DISSOLVE ONE TABLET UNDER THE TONGUE EVERY 5 MINUTES AS NEEDED FOR CHEST PAIN.  DO NOT EXCEED A TOTAL OF 3 DOSES IN 15 MINUTES 25 tablet 10  . OLANZapine (ZYPREXA) 5 MG tablet Take 1 tablet (5 mg total) by mouth at bedtime. For anxiety and sleep 30 tablet 1  . olmesartan (BENICAR) 20 MG tablet Take 1 tablet (20 mg total) by mouth daily. 90 tablet 3  . spironolactone (ALDACTONE) 25 MG tablet Take 1/2 (one-half) tablet by mouth once daily 45 tablet 3  . traZODone (DESYREL) 100 MG tablet Take 100 mg by mouth at bedtime as needed for sleep.    [name] venlafaxine XR (EFFEXOR-XR) 150 MG 24 hr capsule TAKE 2 CAPSULES BY MOUTH ONCE DAILY (NEEDS to follow-up with psychiatry for further refills) 60 capsule 0   No current facility-administered medications for this visit.    Allergies:   Tetracycline and Meperidine   Social History:  The patient  reports that she quit smoking  about 23 years ago. Her smoking use included cigarettes. She has never used smokeless tobacco. She reports current alcohol use of about 1.0 standard drink of alcohol per week. She reports that she does not use drugs.   Family History:  The patient's family history includes Anxiety disorder in her maternal aunt and mother; Dementia in her mother; Depression in her mother; Diabetes in her mother; Drug abuse in her cousin; Heart disease in her mother; Heart failure in her mother; High Cholesterol in her father and mother; Hyperlipidemia in her mother; Hypertension in her father and mother; Mood Disorder in her sister; Paranoid behavior in her mother; Stroke  in her mother and sister; Tuberculosis in her paternal grandfather.    ROS:  Please see the history of present illness.   All other systems are personally reviewed and negative.    PHYSICAL EXAM: VS:  BP 102/64   Pulse 75   Ht _0  (1.6 m)   Wt 169 lb (76.7 kg)   SpO2 97%   BMI 29.94 kg/m  , BMI Body mass index is 29.94 kg/m. GEN: Well nourished, well developed, in no acute distress HEENT: normal Neck: no JVD  Cardiac:RRR  Respiratory:  normal work of breathing GI: soft  MS: no deformity or atrophy Skin: warm and dry  Neuro:  Strength and sensation are intact Psych: euthymic mood, full affect  EKG:  EKG is ordered today. The ekg ordered today is personally reviewed and shows sinus rhythm with LBBB (QRS 164 msec)   Recent Labs: 04/18/2020: ALT 19 08/18/2020: TSH 1.63 09/14/2020: Hemoglobin 12.6; Platelets 344.0 10/14/2020: NT-Pro BNP 7,853 11/09/2020: BUN 18; Creatinine, Ser 1.16; Potassium 4.5; Sodium 140  personally reviewed   Lipid Panel     Component Value Date/Time   CHOL 112 04/18/2020 0815   TRIG 65 04/18/2020 0815   HDL 39 (L) 04/18/2020 0815   CHOLHDL 2.9 04/18/2020 0815   CHOLHDL 3.7 03/31/2020 0514   VLDL 15 03/31/2020 0514   LDLCALC 59 04/18/2020 0815   LDLDIRECT 78.0 02/07/2018 0928   personally reviewed    Wt Readings from Last 3 Encounters:  11/14/20 169 lb (76.7 kg)  11/09/20 168 lb 9.6 oz (76.5 kg)  10/14/20 171 lb 6.4 oz (77.7 kg)     Other studies personally reviewed: Additional studies/ records that were reviewed today include: my prior notes, Leta Speller recent notes, prior echo  Review of the above records today demonstrates: as above   ASSESSMENT AND PLAN:  1.  Ischemic CM/ chronic systolic dysfunction/ LBBB The patient has an ischemic CM (EF <20%), NYHA Class III CHF, and CAD.  She has LBBB with QRS > 150 msec. She is referred by Dr Burt Knack and Richardson Dopp for risk stratification of sudden death and consideration of ICD implantation.  At this time, she meets MADIT II/ SCD-HeFT criteria for BiV ICD implantation for primary prevention of sudden death.  I have had a thorough discussion with the patient reviewing options.  The patient has had opportunities to ask questions and have them answered. The patient has decided together through a shared decision making process to decline BiV ICD at this time.   She will continue to think about this option.  If she changes her mind, she will contact my office.  In the interim,she will continue medical therapy and close follow-up with the cardiology team.  Signed, Thompson Grayer, MD  11/14/2020 4:43 PM     [facility] [address] [place] [address] [phone] (office) [phone] (fax)

## 2020-11-18 DIAGNOSIS — M791 Myalgia, unspecified site: Secondary | ICD-10-CM | POA: Diagnosis not present

## 2020-11-18 DIAGNOSIS — U071 COVID-19: Secondary | ICD-10-CM | POA: Diagnosis not present

## 2020-11-30 ENCOUNTER — Other Ambulatory Visit: Payer: Self-pay

## 2020-11-30 ENCOUNTER — Ambulatory Visit (INDEPENDENT_AMBULATORY_CARE_PROVIDER_SITE_OTHER): Payer: Medicare Other | Admitting: Physician Assistant

## 2020-11-30 ENCOUNTER — Telehealth (HOSPITAL_COMMUNITY): Payer: Self-pay | Admitting: Vascular Surgery

## 2020-11-30 ENCOUNTER — Encounter: Payer: Self-pay | Admitting: Physician Assistant

## 2020-11-30 VITALS — BP 140/78 | HR 80 | Ht 63.0 in | Wt 170.0 lb

## 2020-11-30 DIAGNOSIS — I5020 Unspecified systolic (congestive) heart failure: Secondary | ICD-10-CM | POA: Diagnosis not present

## 2020-11-30 DIAGNOSIS — I2510 Atherosclerotic heart disease of native coronary artery without angina pectoris: Secondary | ICD-10-CM | POA: Diagnosis not present

## 2020-11-30 DIAGNOSIS — I255 Ischemic cardiomyopathy: Secondary | ICD-10-CM | POA: Diagnosis not present

## 2020-11-30 DIAGNOSIS — I10 Essential (primary) hypertension: Secondary | ICD-10-CM

## 2020-11-30 DIAGNOSIS — E782 Mixed hyperlipidemia: Secondary | ICD-10-CM | POA: Diagnosis not present

## 2020-11-30 DIAGNOSIS — R42 Dizziness and giddiness: Secondary | ICD-10-CM

## 2020-11-30 MED ORDER — VALSARTAN 40 MG PO TABS: 40.0000 mg | ORAL_TABLET | Freq: Two times a day (BID) | ORAL | 1 refills | Status: DC

## 2020-11-30 NOTE — Progress Notes (Signed)
Cardiology Office Note:    Date:  11/30/2020   ID:  [name], [name] 07-Aug-1951, MRN [id number]  PCP:  Leone Haven, MD  [facility] Cardiologist:  Sherren Mocha, MD   [facility] Electrophysiologist:  None   Referring MD: Leone Haven, MD   Chief Complaint:  Follow-up (CHF)    Patient Profile:    Melanie Ray is a 70 y.o. female with:   Coronary artery disease  ? S/p Anterior STEMI 5/17 >> PCI: DES x 2 to LAD ? Myoview 7/21: EF 20, no ischemia  Heart failure with reduced ejection fraction  ? Ischemic CM ? EF 30-35 at time of MI >> DC on [employer] >> Pt DC'd at FU visit ? Echocardiogram 8/17: EF 25-30 ? Echocardiogram 3/18: EF 30-35 ? Echocardiogram 8/19: EF 30-35 ? EP eval (J Allred) x 2 >> pt declined CRT-D ? Intol of Entresto ? Echocardiogram 03/2020: EF < 20  Obsessive Compulsive d/o  Diabetes mellitus 2  Hypertension   Hyperlipidemia   LBBB  OSA  Hepatic steatosis  Hx of cerebellar CVA 03/2020  Bilateral vertebral artery stenosis   Prior CV studies: Myoview 7/21:  EF 20, no ischemia; high risk  Echocardiogram 03/31/20 (w bubble study) EF < 20, global HK, mod LVH, mod LAE, mod MR, RVSP 41.4, bubble study neg  Head and neck CTA 03/31/20 1. CT head negative for acute abnormality. Small acute infarct in the left thalamus seen on MRI last night is not visualized by CT. No intracranial hemorrhage 2. Atherosclerotic aortic arch and proximal great vessels. Atherosclerotic disease in the carotid bifurcation bilaterally without significant stenosis. 3. Moderate to severe stenosis proximal right vertebral artery which is then patent to the basilar. Severe stenosis proximal left vertebral artery which is diffusely diseased and small but does contribute to the basilar. 4. Occlusion left P3 segment. Moderate stenosis left P2 segment 5. Anterior circulation widely patent. 6. Multiple thyroid nodules. The largest nodule  on the left is 20 mm. Thyroid ultrasound recommended. (Ref: J Am Coll Radiol. 2015 Feb;12(2): 143-50).  Carotid US 03/31/20 R 50-69 (no sig dz on CTA)  Echo 06/30/18 Mild conc LVH, EF 30-35, ant-sept/inf/inf-sept HK, Gr 1 DD, mild to mod AI, MAC, mobile density on MV unchanged, mild MR, mild TR, PASP 39  Echo 01/30/17 Diff HK, mild focal basal septal hypertrophy, EF 30-35, mild AI, MAC, mild MR  Echo 06/08/16 Mild focal basal septal hypertrophy, EF 25-30%, diff HK, ant-septal AK, Gr 1 DD, mild AI, MAC, mild MR, PASP 37 mmHg  Echo 03/18/16 EF 30-35%, ant-septal AK, Gr 1 DD, mild MR, severe LAE.  LHC 03/17/16 LAD proximal 80%, mid 80%, distal 50%, ostial D1 60%  LCx with LPDA lesion, 30%  RCA Mild calcification with no significant stenosis in a medium caliber, nondominant RCA LVEF is estimated at 45% with inferoapical and lateral wall akinesis PCI: PCI: 3.5 x 24 mm Promus DES to prox LAD, 2.5 x 12 mm Promus DES to mid LAD.  History of Present Illness:    [name] was last seen 11/09/20.  I placed her on dapagliflozin to advance her HF Rx.  We have been unable to get her approved for Entresto.  She did see [name] 11/14/20 but has again declined proceeding with CRT-D implantation.  She returns for f/u.  She is here alone. She feels better since starting on Dapagliflozin.  She feels her breathing is better.  She has not had orthopnea, paroxysmal nocturnal dyspnea,  leg edema, syncope or chest pain.  She does have a lot of dizziness.  She feels dizzy all the time.  She has not fallen or felt near syncopal.  She saw vestibular rehab a few years ago and was told she does not have vertigo.  She has a lightheaded and a spinning sensation.        Past Medical History:  Diagnosis Date  . Anemia   . Anxiety   . Back pain   . CAD (coronary artery disease) 04/11/2016   S/p ant STEMI 5/17: LHC >> LAD proximal 80%, mid 80%, distal 50%, ostial D1 60%; LCx with LPDA lesion 30%; RCA Mild  calcification with no significant stenosis in a medium caliber, nondominant RCA; LVEF is estimated at 45% with inferoapical and lateral wall akinesis >> PCI: PCI: 3.5 x 24 mm Promus DES to prox LAD, 2.5 x 12 mm Promus DES to mid LAD. // Myoview 7/21: EF 20, no ischemia; high risk   . Chest pain   . Chronic systolic CHF (congestive heart failure) ([place]) 03/21/2016   Echo 01/30/17: Diff HK, mild focal basal septal hypertrophy, EF 30-35, mild AI, MAC, mild MR // Echo 06/08/16: Mild focal basal septal hypertrophy, EF 25-30%, diff HK, ant-septal AK, Gr 1 DD, mild AI, MAC, mild MR, PASP 37 mmHg // Echo 03/18/16: EF 30-35%, ant-septal AK, Gr 1 DD, mild MR, severe LAE.  [name] Constipation   . Depression   . Diabetes mellitus type 2 in obese ([place])   . Dizziness   . Glaucoma   . Gout   . Heart disease   . Heartburn   . History of acute anterior wall MI 03/17/2016  . History of heart attack   . Hyperlipemia   . Hypertension   . Ischemic cardiomyopathy 10/02/2016   Refused ICD  . Joint pain   . Left bundle branch block   . Myocardial infarction ([place])   . Nausea   . Sleep apnea   . SOB (shortness of breath)   . Suicidal ideation 08/27/2018  . Swelling    feet or legs    Current Medications: Current Meds  Medication Sig  . albuterol (VENTOLIN HFA) 108 (90 Base) MCG/ACT inhaler Inhale 2 puffs into the lungs every 6 (six) hours as needed for shortness of breath.   [name] atorvastatin (LIPITOR) 80 MG tablet Take 1 tablet by mouth once daily  . blood glucose meter kit and supplies KIT Dispense based on patient and insurance preference.  Check glucose once daily fasting in the morning. (FOR ICD 10 E11.9).  . carvedilol (COREG) 12.5 MG tablet TAKE 1 TABLET BY MOUTH TWICE DAILY WITH MEALS  . clopidogrel (PLAVIX) 75 MG tablet Take 1 tablet (75 mg total) by mouth daily.  . dapagliflozin propanediol ([name]) 10 MG TABS tablet Take 1 tablet (10 mg total) by mouth daily before breakfast.  . dorzolamide-timolol  (COSOPT) 22.3-6.8 MG/ML ophthalmic solution Place 1 drop into both eyes 2 (two) times daily.   . furosemide (LASIX) 40 MG tablet 40 MG Monday, Wednesday, Friday AND 20 MG EVERY OTHER DAY.  . hydrOXYzine (ATARAX/VISTARIL) 10 MG tablet Take 1 tablet (10 mg total) by mouth 3 (three) times daily as needed.  . isosorbide mononitrate (IMDUR) 30 MG 24 hr tablet Take 1 tablet (30 mg total) by mouth daily.  [name] latanoprost (XALATAN) 0.005 % ophthalmic solution Place 1 drop into both eyes at bedtime.  [name] lurasidone (LATUDA) 40 MG TABS tablet Take 40 mg by mouth daily  with breakfast.  . metFORMIN (GLUCOPHAGE) 500 MG tablet TAKE 1 TABLET BY MOUTH TWICE DAILY WITH A MEAL  . nitroGLYCERIN (NITROSTAT) 0.4 MG SL tablet DISSOLVE ONE TABLET UNDER THE TONGUE EVERY 5 MINUTES AS NEEDED FOR CHEST PAIN.  DO NOT EXCEED A TOTAL OF 3 DOSES IN 15 MINUTES  . OLANZapine (ZYPREXA) 5 MG tablet Take 1 tablet (5 mg total) by mouth at bedtime. For anxiety and sleep  . spironolactone (ALDACTONE) 25 MG tablet Take 1/2 (one-half) tablet by mouth once daily  . traZODone (DESYREL) 100 MG tablet Take 100 mg by mouth at bedtime as needed for sleep.  . valsartan (DIOVAN) 40 MG tablet Take 1 tablet (40 mg total) by mouth 2 (two) times daily.  [name] venlafaxine XR (EFFEXOR-XR) 150 MG 24 hr capsule TAKE 2 CAPSULES BY MOUTH ONCE DAILY (NEEDS to follow-up with psychiatry for further refills)  . [DISCONTINUED] olmesartan (BENICAR) 20 MG tablet Take 1 tablet (20 mg total) by mouth daily.     Allergies:   Tetracycline and Meperidine   Social History   Tobacco Use  . Smoking status: Former Smoker    Types: Cigarettes    Quit date: 04/13/1997    Years since quitting: 23.6  . Smokeless tobacco: Never Used  Vaping Use  . Vaping Use: Never used  Substance Use Topics  . Alcohol use: Yes    Alcohol/week: 1.0 standard drink    Types: 1 Glasses of wine per week    Comment: 2-3 glasses of wine per month  . Drug use: No     Family Hx: The patient's  family history includes Anxiety disorder in her maternal aunt and mother; Dementia in her mother; Depression in her mother; Diabetes in her mother; Drug abuse in her cousin; Heart disease in her mother; Heart failure in her mother; High Cholesterol in her father and mother; Hyperlipidemia in her mother; Hypertension in her father and mother; Mood Disorder in her sister; Paranoid behavior in her mother; Stroke in her mother and sister; Tuberculosis in her paternal grandfather.  Review of Systems  Neurological: Positive for dizziness.     EKGs/Labs/Other Test Reviewed:    EKG:  EKG is not ordered today.  The ekg ordered today demonstrates n/a  Recent Labs: 04/18/2020: ALT 19 08/18/2020: TSH 1.63 09/14/2020: Hemoglobin 12.6; Platelets 344.0 10/14/2020: NT-Pro BNP 7,853 11/09/2020: BUN 18; Creatinine, Ser 1.16; Potassium 4.5; Sodium 140   Recent Lipid Panel Lab Results  Component Value Date/Time   CHOL 112 04/18/2020 08:15 AM   TRIG 65 04/18/2020 08:15 AM   HDL 39 (L) 04/18/2020 08:15 AM   CHOLHDL 2.9 04/18/2020 08:15 AM   CHOLHDL 3.7 03/31/2020 05:14 AM   LDLCALC 59 04/18/2020 08:15 AM   LDLDIRECT 78.0 02/07/2018 09:28 AM      Risk Assessment/Calculations:      Physical Exam:    VS:  BP 140/78   Pulse 80   Ht 5' 3" (1.6 m)   Wt 170 lb (77.1 kg)   SpO2 97%   BMI 30.11 kg/m     Wt Readings from Last 3 Encounters:  11/30/20 170 lb (77.1 kg)  11/14/20 169 lb (76.7 kg)  11/09/20 168 lb 9.6 oz (76.5 kg)     Constitutional:      Appearance: Healthy appearance. Not in distress.  Neck:     Vascular: No JVR. JVD normal.  Pulmonary:     Effort: Pulmonary effort is normal.     Breath sounds: No wheezing. No rales.  Cardiovascular:     Normal rate. Regular rhythm. Normal S1. Normal S2.     Murmurs: There is no murmur.     No gallop.  Edema:    Peripheral edema absent.  Abdominal:     Palpations: Abdomen is soft. There is no hepatomegaly.  Skin:    General: Skin is warm  and dry.  Neurological:     Mental Status: Alert and oriented to person, place and time.     Cranial Nerves: Cranial nerves are intact.       ASSESSMENT & PLAN:    1. HFrEF (heart failure with reduced ejection fraction) (HCC) EF < 20.  NYHA IIb.  Volume status stable.  She has declined CRT-D again.  We continue to try to get [facility] for her.  I have given her the # for Norvat[employer] to call for assistance.  For now, I will stop Olmesartan.  Start Valsartan 40 mg twice daily.  Continue current dose of dapagliflozin, spironolactone, carvedilol and isosorbide.  If we can get her approved for Entresto, I will switch her Valsartan to [name].  Otherwise we can try to add hydralazine if her BP remains 130/80 or higher.  She has an appt with Dr. Haroldine Laws in 3/22.  I will see her again in 3-4 weeks to titrate GDMT further.    2. Coronary artery disease involving native coronary artery of native heart without angina pectoris Hx of anterior STEMI in 5/17 tx with DES x 2 to the LAD.  Myoview 7/21 low risk.  she is not having angina.  Continue clopidogrel, atorvastatin, carvedilol.    3. Essential hypertension BP above target.  Adjust medications as outlined for CHF.   4. Dizziness She has bilat proximal vertebral artery stenosis.  Question if her dizziness could be from this.  I will d/w her at the next visit referral to interventional neuroradiology.     Dispo:  Return in about 4 weeks (around 12/28/2020) for Routine Follow Up, w/ Richardson Dopp, PA-C, in person.   Medication Adjustments/Labs and Tests Ordered: Current medicines are reviewed at length with the patient today.  Concerns regarding medicines are outlined above.  Tests Ordered: Orders Placed This Encounter  Procedures  . Basic metabolic panel   Medication Changes: Meds ordered this encounter  Medications  . valsartan (DIOVAN) 40 MG tablet    Sig: Take 1 tablet (40 mg total) by mouth 2 (two) times daily.    Dispense:  180 tablet     Refill:  1    Signed, Richardson Dopp, PA-C  11/30/2020 3:21 PM    [facility] [address], [place], [place]  [address] Phone: [phone]; Fax: [phone]

## 2020-11-30 NOTE — Telephone Encounter (Signed)
Left pt message giving new chf appt w/ db, asked pt to call back to confirm appt

## 2020-11-30 NOTE — Patient Instructions (Signed)
Medication Instructions:  Your physician has recommended you make the following change in your medication:   1) Stop Olmesartan 2) Start Valsartan 40 mg, 1 tablet by mouth twice a day  *If you need a refill on your cardiac medications before your next appointment, please call your pharmacy*  Lab Work: You will have labs drawn today: BMET  If you have labs (blood work) drawn today and your tests are completely normal, you will receive your results only by: [name] MyChart Message (if you have MyChart) OR . A paper copy in the mail If you have any lab test that is abnormal or we need to change your treatment, we will call you to review the results.  Testing/Procedures: None ordered today  Follow-Up: On 12/30/20 at 11:15AM with Richardson Dopp, PA-C

## 2020-12-01 LAB — BASIC METABOLIC PANEL
BUN/Creatinine Ratio: 14 (ref 12–28)
BUN: 15 mg/dL (ref 8–27)
CO2: 23 mmol/L (ref 20–29)
Calcium: 9.6 mg/dL (ref 8.7–10.3)
Chloride: 105 mmol/L (ref 96–106)
Creatinine, Ser: 1.11 mg/dL — ABNORMAL HIGH (ref 0.57–1.00)
GFR calc Af Amer: 58 mL/min/{1.73_m2} — ABNORMAL LOW (ref >59)
GFR calc non Af Amer: 50 mL/min/{1.73_m2} — ABNORMAL LOW (ref >59)
Glucose: 84 mg/dL (ref 65–99)
Potassium: 4.6 mmol/L (ref 3.5–5.2)
Sodium: 142 mmol/L (ref 134–144)

## 2020-12-08 DIAGNOSIS — Z03818 Encounter for observation for suspected exposure to other biological agents ruled out: Secondary | ICD-10-CM | POA: Diagnosis not present

## 2020-12-08 DIAGNOSIS — Z20822 Contact with and (suspected) exposure to covid-19: Secondary | ICD-10-CM | POA: Diagnosis not present

## 2020-12-14 ENCOUNTER — Ambulatory Visit (INDEPENDENT_AMBULATORY_CARE_PROVIDER_SITE_OTHER): Payer: Medicare Other | Admitting: Family Medicine

## 2020-12-14 ENCOUNTER — Other Ambulatory Visit: Payer: Self-pay

## 2020-12-14 ENCOUNTER — Encounter: Payer: Self-pay | Admitting: Family Medicine

## 2020-12-14 DIAGNOSIS — I255 Ischemic cardiomyopathy: Secondary | ICD-10-CM | POA: Diagnosis not present

## 2020-12-14 DIAGNOSIS — E782 Mixed hyperlipidemia: Secondary | ICD-10-CM | POA: Diagnosis not present

## 2020-12-14 DIAGNOSIS — R42 Dizziness and giddiness: Secondary | ICD-10-CM

## 2020-12-14 DIAGNOSIS — I5022 Chronic systolic (congestive) heart failure: Secondary | ICD-10-CM

## 2020-12-14 DIAGNOSIS — F419 Anxiety disorder, unspecified: Secondary | ICD-10-CM | POA: Diagnosis not present

## 2020-12-14 MED ORDER — TETANUS-DIPHTH-ACELL PERTUSSIS 5-2.5-18.5 LF-MCG/0.5 IM SUSP: 0.5000 mL | Freq: Once | INTRAMUSCULAR | 0 refills | Status: AC

## 2020-12-14 NOTE — Assessment & Plan Note (Signed)
She will continue to follow with psychiatry. 

## 2020-12-14 NOTE — Assessment & Plan Note (Addendum)
Chronic intermittent issue. Does not seem to be consistent with vertigo. Orthostatics performed today and were negative. We will see what the heart failure specialist has to offer regarding this and have her follow-up with her cardiologist regarding this. She will also check with her psychiatrist to see if any of her psychiatric medications could be contributing to this.

## 2020-12-14 NOTE — Assessment & Plan Note (Signed)
Seems to be euvolemic today. She did have some swelling recently which may have been related to volume overload though has resolved at this time. She will proceed with her appointment with Dr. Haroldine Laws. She will monitor for recurrent swelling and contact us right away if this recurs.

## 2020-12-14 NOTE — Patient Instructions (Addendum)
Nice to see you. Please discuss your dizziness with your psychiatrist to see if they think any of your medicines could be contributing. You will also follow-up with the cardiologist regarding this. Please get your tetanus vaccine at the pharmacy. If your leg swelling returns please let us know right away.

## 2020-12-14 NOTE — Progress Notes (Signed)
Tommi Rumps, MD Phone: [phone]  Melanie Ray is a 70 y.o. female who presents today for follow-up.  Hyperlipidemia: Patient continues on Lipitor. No chest pain, claudication, right upper quadrant pain, or myalgias.  CHF: She has chronic dyspnea on exertion. She follows with cardiology for this. She notes her dyspnea has improved since starting on farxiga though does still have some dyspnea. She continues on spironolactone carvedilol, and isosorbide. They changed her to valsartan. They are working to get her approved for [name]. She is seeing the heart failure clinic at [facility] in March. She reports chronic lightheadedness that typically occurs when she gets up out of bed or gets up from a seated position. She denies any vertiginous symptoms.  Leg swelling: Notes she had a 5-day stretch where she developed bilateral lower leg swelling and had discomfort over the dorsum of her feet and ankles with this. She soaked her legs in Epsom salts and the swelling and pain resolved. There is the potential she ate something saltier than typical.  Anxiety/depression: Patient notes these things are stable and adequately controlled. Notes some anxiety and some depression. No SI. She does follow with psychiatry. There have been no changes to her medication regimen by psychiatry.  Social History   Tobacco Use  Smoking Status Former Smoker  . Types: Cigarettes  . Quit date: 04/13/1997  . Years since quitting: 23.6  Smokeless Tobacco Never Used    Current Outpatient Medications on File Prior to Visit  Medication Sig Dispense Refill  . albuterol (VENTOLIN HFA) 108 (90 Base) MCG/ACT inhaler Inhale 2 puffs into the lungs every 6 (six) hours as needed for shortness of breath.     [name] atorvastatin (LIPITOR) 80 MG tablet Take 1 tablet by mouth once daily 90 tablet 3  . blood glucose meter kit and supplies KIT Dispense based on patient and insurance preference.  Check glucose once daily fasting  in the morning. (FOR ICD 10 E11.9). 1 each 0  . carvedilol (COREG) 12.5 MG tablet TAKE 1 TABLET BY MOUTH TWICE DAILY WITH MEALS 180 tablet 3  . clopidogrel (PLAVIX) 75 MG tablet Take 1 tablet (75 mg total) by mouth daily. 90 tablet 1  . dapagliflozin propanediol ([name]) 10 MG TABS tablet Take 1 tablet (10 mg total) by mouth daily before breakfast. 30 tablet 6  . dorzolamide-timolol (COSOPT) 22.3-6.8 MG/ML ophthalmic solution Place 1 drop into both eyes 2 (two) times daily.     . furosemide (LASIX) 40 MG tablet 40 MG Monday, Wednesday, Friday AND 20 MG EVERY OTHER DAY.    . hydrOXYzine (ATARAX/VISTARIL) 10 MG tablet Take 1 tablet (10 mg total) by mouth 3 (three) times daily as needed. 30 tablet 0  . isosorbide mononitrate (IMDUR) 30 MG 24 hr tablet Take 1 tablet (30 mg total) by mouth daily. 90 tablet 3  . latanoprost (XALATAN) 0.005 % ophthalmic solution Place 1 drop into both eyes at bedtime.    [name] lurasidone (LATUDA) 40 MG TABS tablet Take 40 mg by mouth daily with breakfast.    . metFORMIN (GLUCOPHAGE) 500 MG tablet TAKE 1 TABLET BY MOUTH TWICE DAILY WITH A MEAL 180 tablet 0  . nitroGLYCERIN (NITROSTAT) 0.4 MG SL tablet DISSOLVE ONE TABLET UNDER THE TONGUE EVERY 5 MINUTES AS NEEDED FOR CHEST PAIN.  DO NOT EXCEED A TOTAL OF 3 DOSES IN 15 MINUTES 25 tablet 10  . OLANZapine (ZYPREXA) 5 MG tablet Take 1 tablet (5 mg total) by mouth at bedtime. For anxiety and sleep  30 tablet 1  . spironolactone (ALDACTONE) 25 MG tablet Take 1/2 (one-half) tablet by mouth once daily 45 tablet 3  . traZODone (DESYREL) 100 MG tablet Take 100 mg by mouth at bedtime as needed for sleep.    . valsartan (DIOVAN) 40 MG tablet Take 1 tablet (40 mg total) by mouth 2 (two) times daily. 180 tablet 1  . venlafaxine XR (EFFEXOR-XR) 150 MG 24 hr capsule TAKE 2 CAPSULES BY MOUTH ONCE DAILY (NEEDS to follow-up with psychiatry for further refills) 60 capsule 0   No current facility-administered medications on file prior to visit.      ROS see history of present illness  Objective  Physical Exam Vitals:   12/14/20 1425  BP: 130/70  Pulse: 84  Temp: 98.2 F (36.8 C)  SpO2: 97%    Blood pressure 134/76 pulse 76 Sitting blood pressure 136/80 pulse 80 Standing blood pressure 135/79 pulse 84  BP Readings from Last 3 Encounters:  12/14/20 130/70  11/30/20 140/78  11/14/20 102/64   Wt Readings from Last 3 Encounters:  12/14/20 167 lb (75.8 kg)  11/30/20 170 lb (77.1 kg)  11/14/20 169 lb (76.7 kg)    Physical Exam Constitutional:      General: She is not in acute distress.    Appearance: She is not diaphoretic.  Cardiovascular:     Rate and Rhythm: Normal rate and regular rhythm.     Heart sounds: Normal heart sounds.  Pulmonary:     Effort: Pulmonary effort is normal.     Breath sounds: Normal breath sounds.  Musculoskeletal:        General: No edema.     Right lower leg: No edema.     Left lower leg: No edema.     Comments: No tenderness in her bilateral lower legs  Skin:    General: Skin is warm and dry.  Neurological:     Mental Status: She is alert.      Assessment/Plan: Please see individual problem list.  Problem List Items Addressed This Visit    Anxiety    She will continue to follow with psychiatry.      Chronic systolic CHF (congestive heart failure) (HCC)    Seems to be euvolemic today. She did have some swelling recently which may have been related to volume overload though has resolved at this time. She will proceed with her appointment with Dr. Haroldine Laws. She will monitor for recurrent swelling and contact us right away if this recurs.      Dizziness    Chronic intermittent issue. Does not seem to be consistent with vertigo. Orthostatics performed today and were negative. We will see what the heart failure specialist has to offer regarding this and have her follow-up with her cardiologist regarding this. She will also check with her psychiatrist to see if any of her  psychiatric medications could be contributing to this.      HLD (hyperlipidemia)    Continue Lipitor. Most recent lipid panel adequately controlled.          Health Maintenance: The patient will get her tetanus vaccine at the pharmacy.     This visit occurred during the SARS-CoV-2 public health emergency.  Safety protocols were in place, including screening questions prior to the visit, additional usage of staff PPE, and extensive cleaning of exam room while observing appropriate contact time as indicated for disinfecting solutions.    Tommi Rumps, MD [place]

## 2020-12-14 NOTE — Assessment & Plan Note (Signed)
Continue Lipitor. Most recent lipid panel adequately controlled.

## 2020-12-29 NOTE — Progress Notes (Signed)
Cardiology Office Note:    Date:  12/30/2020   ID:  Melanie Ray, Melanie Ray 1951-09-17, MRN [id number]  PCP:  Leone Haven, MD  [facility] Cardiologist:  Sherren Mocha, MD   APP:  Sharmon Revere  [facility] Electrophysiologist:  None   Referring MD: Leone Haven, MD   Chief Complaint:  Follow-up (CHF)    Patient Profile:    Melanie Ray is a 70 y.o. female with:   Coronary artery disease  ? S/p Anterior STEMI 5/17 >> PCI: DES x 2 to LAD ? Myoview 7/21: EF 20, no ischemia  Heart failure with reduced ejection fraction  ? Ischemic CM ? EF 30-35 at time of MI >> DC on Lifevest >> Pt DC'd at FU visit ? Echocardiogram 8/17: EF 25-30 ? Echocardiogram 3/18: EF 30-35 ? Echocardiogram 8/19: EF 30-35 ? EP eval (J Allred) x 2 >> pt declined CRT-D ? Intol of Entresto ? Echocardiogram 03/2020: EF < 20  Obsessive Compulsive d/o  Diabetes mellitus 2  Hypertension   Hyperlipidemia   LBBB  OSA  Hepatic steatosis  Hx of cerebellar CVA 03/2020  Bilateral vertebral artery stenosis   Prior CV studies: Myoview 7/21:  EF 20, no ischemia; high risk  Echocardiogram 03/31/20 (w bubble study) EF < 20, global HK, mod LVH, mod LAE, mod MR, RVSP 41.4, bubble study neg  Head and neck CTA 03/31/20 1. CT head negative for acute abnormality. Small acute infarct in the left thalamus seen on MRI last night is not visualized by CT. No intracranial hemorrhage 2. Atherosclerotic aortic arch and proximal great vessels. Atherosclerotic disease in the carotid bifurcation bilaterally without significant stenosis. 3. Moderate to severe stenosis proximal right vertebral artery which is then patent to the basilar. Severe stenosis proximal left vertebral artery which is diffusely diseased and small but does contribute to the basilar. 4. Occlusion left P3 segment. Moderate stenosis left P2 segment 5. Anterior circulation widely patent. 6. Multiple  thyroid nodules. The largest nodule on the left is 20 mm. Thyroid ultrasound recommended. (Ref: J Am Coll Radiol. 2015 Feb;12(2): 143-50).  Carotid US 03/31/20 R 50-69 (no sig dz on CTA)  Echo 06/30/18 Mild conc LVH, EF 30-35, ant-sept/inf/inf-sept HK, Gr 1 DD, mild to mod AI, MAC, mobile density on MV unchanged, mild MR, mild TR, PASP 39  Echo 01/30/17 Diff HK, mild focal basal septal hypertrophy, EF 30-35, mild AI, MAC, mild MR  Echo 06/08/16 Mild focal basal septal hypertrophy, EF 25-30%, diff HK, ant-septal AK, Gr 1 DD, mild AI, MAC, mild MR, PASP 37 mmHg  Echo 03/18/16 EF 30-35%, ant-septal AK, Gr 1 DD, mild MR, severe LAE.  LHC 03/17/16 LAD proximal 80%, mid 80%, distal 50%, ostial D1 60%  LCx with LPDA lesion, 30%  RCA Mild calcification with no significant stenosis in a medium caliber, nondominant RCA LVEF is estimated at 45% with inferoapical and lateral wall akinesis PCI: PCI: 3.5 x 24 mm Promus DES to prox LAD, 2.5 x 12 mm Promus DES to mid LAD.  History of Present Illness:    Ms. Sklar was last seen 11/30/20.  She returns for further titration of GDMT.  She is here alone.  She remains short of breath with mild to mod activities.  She did have COVID-19 in Jan.  She wondered if she had long COVID syndrome. But, her symptoms did improve.  I think her shortness of breath is more related to her CHF.  She has not had  chest pain, orthopnea, leg edema or syncope.  She still gets lightheaded when she stands.      Past Medical History:  Diagnosis Date  . Anemia   . Anxiety   . Back pain   . CAD (coronary artery disease) 04/11/2016   S/p ant STEMI 5/17: LHC >> LAD proximal 80%, mid 80%, distal 50%, ostial D1 60%; LCx with LPDA lesion 30%; RCA Mild calcification with no significant stenosis in a medium caliber, nondominant RCA; LVEF is estimated at 45% with inferoapical and lateral wall akinesis >> PCI: PCI: 3.5 x 24 mm Promus DES to prox LAD, 2.5 x 12 mm Promus DES to mid  LAD. // Myoview 7/21: EF 20, no ischemia; high risk   . Chest pain   . Chronic systolic CHF (congestive heart failure) ([place]) 03/21/2016   Echo 01/30/17: Diff HK, mild focal basal septal hypertrophy, EF 30-35, mild AI, MAC, mild MR // Echo 06/08/16: Mild focal basal septal hypertrophy, EF 25-30%, diff HK, ant-septal AK, Gr 1 DD, mild AI, MAC, mild MR, PASP 37 mmHg // Echo 03/18/16: EF 30-35%, ant-septal AK, Gr 1 DD, mild MR, severe LAE.  [name] Constipation   . Depression   . Diabetes mellitus type 2 in obese ([place])   . Dizziness   . Glaucoma   . Gout   . Heart disease   . Heartburn   . History of acute anterior wall MI 03/17/2016  . History of heart attack   . Hyperlipemia   . Hypertension   . Ischemic cardiomyopathy 10/02/2016   Refused ICD  . Joint pain   . Left bundle branch block   . Myocardial infarction ([place])   . Nausea   . Sleep apnea   . SOB (shortness of breath)   . Suicidal ideation 08/27/2018  . Swelling    feet or legs    Current Medications: Current Meds  Medication Sig  . albuterol (VENTOLIN HFA) 108 (90 Base) MCG/ACT inhaler Inhale 2 puffs into the lungs every 6 (six) hours as needed for shortness of breath.   [name] atorvastatin (LIPITOR) 80 MG tablet Take 1 tablet by mouth once daily  . blood glucose meter kit and supplies KIT Dispense based on patient and insurance preference.  Check glucose once daily fasting in the morning. (FOR ICD 10 E11.9).  . carvedilol (COREG) 12.5 MG tablet TAKE 1 TABLET BY MOUTH TWICE DAILY WITH MEALS  . clopidogrel (PLAVIX) 75 MG tablet Take 1 tablet (75 mg total) by mouth daily.  . dorzolamide-timolol (COSOPT) 22.3-6.8 MG/ML ophthalmic solution Place 1 drop into both eyes 2 (two) times daily.   . furosemide (LASIX) 40 MG tablet 40 MG Monday, Wednesday, Friday AND 20 MG EVERY OTHER DAY.  . hydrOXYzine (ATARAX/VISTARIL) 10 MG tablet Take 1 tablet (10 mg total) by mouth 3 (three) times daily as needed.  . isosorbide mononitrate (IMDUR) 30 MG 24 hr  tablet Take 1 tablet (30 mg total) by mouth daily.  [name] latanoprost (XALATAN) 0.005 % ophthalmic solution Place 1 drop into both eyes at bedtime.  [name] lurasidone (LATUDA) 40 MG TABS tablet Take 40 mg by mouth daily with breakfast.  . metFORMIN (GLUCOPHAGE) 500 MG tablet TAKE 1 TABLET BY MOUTH TWICE DAILY WITH A MEAL  . nitroGLYCERIN (NITROSTAT) 0.4 MG SL tablet DISSOLVE ONE TABLET UNDER THE TONGUE EVERY 5 MINUTES AS NEEDED FOR CHEST PAIN.  DO NOT EXCEED A TOTAL OF 3 DOSES IN 15 MINUTES  . OLANZapine (ZYPREXA) 5 MG tablet Take 1 tablet (  5 mg total) by mouth at bedtime. For anxiety and sleep  . spironolactone (ALDACTONE) 25 MG tablet Take 1/2 (one-half) tablet by mouth once daily  . traZODone (DESYREL) 100 MG tablet Take 100 mg by mouth at bedtime as needed for sleep.  . valsartan (DIOVAN) 40 MG tablet Take 1 tablet (40 mg total) by mouth 2 (two) times daily.  [name] venlafaxine XR (EFFEXOR-XR) 150 MG 24 hr capsule TAKE 2 CAPSULES BY MOUTH ONCE DAILY (NEEDS to follow-up with psychiatry for further refills)  . [DISCONTINUED] dapagliflozin propanediol ([name]) 10 MG TABS tablet Take 1 tablet (10 mg total) by mouth daily before breakfast.     Allergies:   Tetracycline and Meperidine   Social History   Tobacco Use  . Smoking status: Former Smoker    Types: Cigarettes    Quit date: 04/13/1997    Years since quitting: 23.7  . Smokeless tobacco: Never Used  Vaping Use  . Vaping Use: Never used  Substance Use Topics  . Alcohol use: Yes    Alcohol/week: 1.0 standard drink    Types: 1 Glasses of wine per week    Comment: 2-3 glasses of wine per month  . Drug use: No     Family Hx: The patient's family history includes Anxiety disorder in her maternal aunt and mother; Dementia in her mother; Depression in her mother; Diabetes in her mother; Drug abuse in her cousin; Heart disease in her mother; Heart failure in her mother; High Cholesterol in her father and mother; Hyperlipidemia in her mother;  Hypertension in her father and mother; Mood Disorder in her sister; Paranoid behavior in her mother; Stroke in her mother and sister; Tuberculosis in her paternal grandfather.  ROS   EKGs/Labs/Other Test Reviewed:    EKG:  EKG is not ordered today.  The ekg ordered today demonstrates n/a  Recent Labs: 04/18/2020: ALT 19 08/18/2020: TSH 1.63 09/14/2020: Hemoglobin 12.6; Platelets 344.0 10/14/2020: NT-Pro BNP 7,853 11/30/2020: BUN 15; Creatinine, Ser 1.11; Potassium 4.6; Sodium 142   Recent Lipid Panel Lab Results  Component Value Date/Time   CHOL 112 04/18/2020 08:15 AM   TRIG 65 04/18/2020 08:15 AM   HDL 39 (L) 04/18/2020 08:15 AM   CHOLHDL 2.9 04/18/2020 08:15 AM   CHOLHDL 3.7 03/31/2020 05:14 AM   LDLCALC 59 04/18/2020 08:15 AM   LDLDIRECT 78.0 02/07/2018 09:28 AM      Risk Assessment/Calculations:      Physical Exam:    VS:  BP 130/60   Pulse 95   Ht _0  (1.6 m)   Wt 171 lb 3.2 oz (77.7 kg)   SpO2 95%   BMI 30.33 kg/m     Wt Readings from Last 3 Encounters:  12/30/20 171 lb 3.2 oz (77.7 kg)  12/14/20 167 lb (75.8 kg)  11/30/20 170 lb (77.1 kg)     Constitutional:      Appearance: Healthy appearance. Not in distress.  Neck:     Vascular: No JVR. JVD normal.  Pulmonary:     Effort: Pulmonary effort is normal.     Breath sounds: No wheezing. No rales.  Cardiovascular:     Normal rate. Regular rhythm. Normal S1. Normal S2.     Murmurs: There is no murmur.  Edema:    Peripheral edema absent.  Abdominal:     Palpations: Abdomen is soft. There is no hepatomegaly.  Skin:    General: Skin is warm and dry.  Neurological:     Mental Status: Alert and oriented  to person, place and time.     Cranial Nerves: Cranial nerves are intact.        ASSESSMENT & PLAN:    1. HFrEF (heart failure with reduced ejection fraction) (HCC) Ischemic CM.  NYHA IIb-III.  EF < 20.  She has declined CRT-D.  Volume status seems stable.  She has only been taking Valsartan once  daily.  She never called [employer] for assistance with Delene Loll but plans to do so today.  She may have some BP fluctuation when she stands causing lightheadedness.  I have asked her to stand cautiously.  She has an appt with Dr. Haroldine Laws next month.    -Continue current dose of carvedilol, dapagliflozin, isosorbide, spironolactone  -Increase valsartan to 40 mg twice daily  -Obtain BMET, BNP in 1 week  -If BNP elevated, increase dose of furosemide  2. Coronary artery disease involving native coronary artery of native heart without angina pectoris Hx of anterior STEMI in 5/17 tx with DES x 2 to the LAD.  Myoview 7/21 low risk.  She is doing well without anginal symptoms.  Continue carvedilol, clopidogrel, atorvastatin.  Follow-up 6 months.  3. Essential hypertension The patient's blood pressure is controlled on her current regimen.  Continue current therapy.     Dispo:  Return in about 6 months (around 06/29/2021) for Routine Follow Up, w/ Dr. Burt Knack, or Richardson Dopp, PA-C, in person.   Medication Adjustments/Labs and Tests Ordered: Current medicines are reviewed at length with the patient today.  Concerns regarding medicines are outlined above.  Tests Ordered: Orders Placed This Encounter  Procedures  . Basic metabolic panel  . Pro b natriuretic peptide   Medication Changes: Meds ordered this encounter  Medications  . dapagliflozin propanediol ([name]) 10 MG TABS tablet    Sig: Take 1 tablet (10 mg total) by mouth daily before breakfast.    Dispense:  30 tablet    Refill:  6    Signed, Richardson Dopp, PA-C  12/30/2020 1:08 PM    [facility] [address], [place], [place]  [address] Phone: [phone]; Fax: [phone]

## 2020-12-30 ENCOUNTER — Ambulatory Visit (INDEPENDENT_AMBULATORY_CARE_PROVIDER_SITE_OTHER): Payer: Medicare Other | Admitting: Physician Assistant

## 2020-12-30 ENCOUNTER — Encounter: Payer: Self-pay | Admitting: Physician Assistant

## 2020-12-30 ENCOUNTER — Other Ambulatory Visit: Payer: Self-pay

## 2020-12-30 VITALS — BP 130/60 | HR 95 | Ht 63.0 in | Wt 171.2 lb

## 2020-12-30 DIAGNOSIS — I5020 Unspecified systolic (congestive) heart failure: Secondary | ICD-10-CM

## 2020-12-30 DIAGNOSIS — I10 Essential (primary) hypertension: Secondary | ICD-10-CM

## 2020-12-30 DIAGNOSIS — I2510 Atherosclerotic heart disease of native coronary artery without angina pectoris: Secondary | ICD-10-CM | POA: Diagnosis not present

## 2020-12-30 DIAGNOSIS — I255 Ischemic cardiomyopathy: Secondary | ICD-10-CM | POA: Diagnosis not present

## 2020-12-30 MED ORDER — DAPAGLIFLOZIN PROPANEDIOL 10 MG PO TABS: 10.0000 mg | ORAL_TABLET | Freq: Every day | ORAL | 6 refills | Status: DC

## 2020-12-30 NOTE — Patient Instructions (Signed)
Medication Instructions:   PLEASE INCREASE YOUR VALSARTAN TO 40 MG BY MOUTH TWICE DAILY, AS PREVIOUSLY INSTRUCTED.  *If you need a refill on your cardiac medications before your next appointment, please call your pharmacy*   Lab Work:  IN ONE WEEK HERE IN THE OFFICE--WE WILL CHECK A BMET AND PRO-BNP AT THAT TIME.  If you have labs (blood work) drawn today and your tests are completely normal, you will receive your results only by: [name] MyChart Message (if you have MyChart) OR . A paper copy in the mail If you have any lab test that is abnormal or we need to change your treatment, we will call you to review the results.   Follow-Up: At [facility], you and your health needs are our priority.  As part of our continuing mission to provide you with exceptional heart care, we have created designated Provider Care Teams.  These Care Teams include your primary Cardiologist (physician) and Advanced Practice Providers (APPs -  Physician Assistants and Nurse Practitioners) who all work together to provide you with the care you need, when you need it.  We recommend signing up for the patient portal called "MyChart".  Sign up information is provided on this After Visit Summary.  MyChart is used to connect with patients for Virtual Visits (Telemedicine).  Patients are able to view lab/test results, encounter notes, upcoming appointments, etc.  Non-urgent messages can be sent to your provider as well.   To learn more about what you can do with MyChart, go to [email or web address].    Your next appointment:   6 month(s)  The format for your next appointment:   In Person  Provider:   You may see Sherren Mocha, MDor one of the following Advanced Practice Providers on your designated Care Team:    Richardson Dopp, PA-C    PLEASE FOLLOW-UP AS SCHEDULED WITH CHF CLINIC ON 01/23/21   Other Instructions  --PLEASE CALL [employer] TODAY WHEN YOU GET HOME FOR YOUR ENTRESTO PER SCOTT WEAVER PA-C--

## 2021-01-06 ENCOUNTER — Other Ambulatory Visit: Payer: Medicare Other

## 2021-01-09 ENCOUNTER — Other Ambulatory Visit: Payer: Medicare Other

## 2021-01-10 ENCOUNTER — Other Ambulatory Visit: Payer: Self-pay

## 2021-01-10 ENCOUNTER — Other Ambulatory Visit: Payer: Medicare Other | Admitting: *Deleted

## 2021-01-10 DIAGNOSIS — Z79899 Other long term (current) drug therapy: Secondary | ICD-10-CM | POA: Diagnosis not present

## 2021-01-10 DIAGNOSIS — I10 Essential (primary) hypertension: Secondary | ICD-10-CM | POA: Diagnosis not present

## 2021-01-10 DIAGNOSIS — I5020 Unspecified systolic (congestive) heart failure: Secondary | ICD-10-CM | POA: Diagnosis not present

## 2021-01-10 DIAGNOSIS — F411 Generalized anxiety disorder: Secondary | ICD-10-CM | POA: Diagnosis not present

## 2021-01-10 DIAGNOSIS — I2510 Atherosclerotic heart disease of native coronary artery without angina pectoris: Secondary | ICD-10-CM | POA: Diagnosis not present

## 2021-01-11 LAB — BASIC METABOLIC PANEL
BUN/Creatinine Ratio: 17 (ref 12–28)
BUN: 18 mg/dL (ref 8–27)
CO2: 20 mmol/L (ref 20–29)
Calcium: 10 mg/dL (ref 8.7–10.3)
Chloride: 104 mmol/L (ref 96–106)
Creatinine, Ser: 1.07 mg/dL — ABNORMAL HIGH (ref 0.57–1.00)
Glucose: 72 mg/dL (ref 65–99)
Potassium: 4.5 mmol/L (ref 3.5–5.2)
Sodium: 138 mmol/L (ref 134–144)
eGFR: 56 mL/min/{1.73_m2} — ABNORMAL LOW (ref >59)

## 2021-01-11 LAB — PRO B NATRIURETIC PEPTIDE: NT-Pro BNP: 2842 pg/mL — ABNORMAL HIGH (ref 0–301)

## 2021-01-12 ENCOUNTER — Telehealth: Payer: Self-pay | Admitting: *Deleted

## 2021-01-12 DIAGNOSIS — Z79899 Other long term (current) drug therapy: Secondary | ICD-10-CM

## 2021-01-12 MED ORDER — FUROSEMIDE 40 MG PO TABS: 40.0000 mg | ORAL_TABLET | Freq: Every day | ORAL | 3 refills | Status: DC

## 2021-01-12 NOTE — Telephone Encounter (Signed)
-----   Message from Liliane Shi, [place] sent at 01/11/2021  4:42 PM EST ----- BNP significantly improved but still elevated.  Creatinine stable.  Potassium normal. PLAN:  -Increase furosemide to 40 mg daily -BMET 1 week Richardson Dopp, PA-C    01/11/2021 4:40 PM

## 2021-01-16 DIAGNOSIS — Z79899 Other long term (current) drug therapy: Secondary | ICD-10-CM | POA: Diagnosis not present

## 2021-01-16 DIAGNOSIS — F411 Generalized anxiety disorder: Secondary | ICD-10-CM | POA: Diagnosis not present

## 2021-01-19 ENCOUNTER — Other Ambulatory Visit: Payer: Self-pay

## 2021-01-19 ENCOUNTER — Other Ambulatory Visit: Payer: Medicare Other | Admitting: *Deleted

## 2021-01-19 DIAGNOSIS — R0602 Shortness of breath: Secondary | ICD-10-CM | POA: Diagnosis not present

## 2021-01-19 DIAGNOSIS — Z79899 Other long term (current) drug therapy: Secondary | ICD-10-CM

## 2021-01-19 DIAGNOSIS — I5022 Chronic systolic (congestive) heart failure: Secondary | ICD-10-CM | POA: Diagnosis not present

## 2021-01-20 LAB — BASIC METABOLIC PANEL
BUN/Creatinine Ratio: 19 (ref 12–28)
BUN: 24 mg/dL (ref 8–27)
CO2: 20 mmol/L (ref 20–29)
Calcium: 10.5 mg/dL — ABNORMAL HIGH (ref 8.7–10.3)
Chloride: 98 mmol/L (ref 96–106)
Creatinine, Ser: 1.27 mg/dL — ABNORMAL HIGH (ref 0.57–1.00)
Glucose: 68 mg/dL (ref 65–99)
Potassium: 4.3 mmol/L (ref 3.5–5.2)
Sodium: 137 mmol/L (ref 134–144)
eGFR: 45 mL/min/{1.73_m2} — ABNORMAL LOW (ref >59)

## 2021-01-20 LAB — PRO B NATRIURETIC PEPTIDE: NT-Pro BNP: 1682 pg/mL — ABNORMAL HIGH (ref 0–301)

## 2021-01-22 NOTE — Progress Notes (Signed)
ADVANCED HF CLINIC CONSULT NOTE  Referring Physician: Dr. Burt Knack Primary Care: Leone Haven, MD Primary Cardiologist: Sherren Mocha, MD   HPI:  Ms. Chantelle Verdi is a 71 y.o. female with the following medical issues. She was referred by Dr. Burt Knack for further assessment and managemt.   Coronary artery disease  ? S/p Anterior STEMI 5/17 >> PCI: DES x 2 to LAD ? Myoview 7/21: EF 20, no ischemia  Heart failure with reduced ejection fraction  ? Ischemic CM ? EF 30-35 at time of MI 5/17 >> DC on Lifevest >> Pt DC'd at FU visit ? Echocardiogram 8/17: EF 25-30 ? Echocardiogram 3/18: EF 30-35 ? Echocardiogram 8/19: EF 30-35 ? EP eval (J Allred) x 2 >> pt declined CRT-D ? Intol of Entresto ? Echocardiogram 03/2020: EF < 20% ? Myoview 7/21 EF 20% no ischemia  Obsessive Compulsive d/o  Diabetes mellitus 2  Hypertension   Hyperlipidemia   LBBB  OSA  Hepatic steatosis  Hx of cerebellar CVA 03/2020  Bilateral vertebral artery stenosis    Says she is doing ok. Has good days and bad days. Has more days recently. SOB with mild activity.But says she has been walking on TM for 8 mins every day for last 6 weeks and feeling better. No able to go to store  Gets dizzy frequently. Can happen at any time. Has been chronic for her prior to her heart attack. No CP. No orthopnea or PND. Stopped [name] due to cost.     Review of Systems: [y] = yes, _0  = no   General: Weight gain _1 ; Weight loss _2 ; Anorexia _3 ; Fatigue [ y]; Fever _4 ; Chills _5 ; Weakness _6   Cardiac: Chest pain/pressure _7 ; Resting SOB _8 ; Exertional SOB Blue.Reese ]; Orthopnea _9 ; Pedal Edema _10 ; Palpitations _11 ; Syncope _12 ; Presyncope _13 ; Paroxysmal nocturnal dyspnea_14   Pulmonary: Cough _15 ; Wheezing_16 ; Hemoptysis_17 ; Sputum _18 ; Snoring _19   GI: Vomiting_20 ; Dysphagia_21 ; Melena_22 ; Hematochezia _23 ; Heartburn_24 ; Abdominal pain _25 ; Constipation _26 ; Diarrhea _27 ; BRBPR _28   GU: Hematuria[  ]; Dysuria _29 ; Nocturia_30   Vascular: Pain in legs with walking _31 ; Pain in feet with lying flat _32 ; Non-healing sores _33 ; Stroke _34 ; TIA _35 ; Slurred speech _36 ;  Neuro: Headaches_37 ; Vertigo_38 ; Seizures_39 ; Paresthesias_40 ;Blurred vision _41 ; Diplopia _42 ; Vision changes _43   Ortho/Skin: Arthritis [ y]; Joint pain _44 y; Muscle pain _45 ; Joint swelling _46 ; Back Pain [ y]; Rash _47   Psych: Depression[y ]; Anxiety[y ]  Heme: Bleeding problems _48 ; Clotting disorders _49 ; Anemia _50   Endocrine: Diabetes _51 ; Thyroid dysfunction_52    Past Medical History:  Diagnosis Date  . Anemia   . Anxiety   . Back pain   . CAD (coronary artery disease) 04/11/2016   S/p ant STEMI 5/17: LHC >> LAD proximal 80%, mid 80%, distal 50%, ostial D1 60%; LCx with LPDA lesion 30%; RCA Mild calcification with no significant stenosis in a medium caliber, nondominant RCA; LVEF is estimated at 45% with inferoapical and lateral wall akinesis >> PCI: PCI: 3.5 x 24 mm Promus DES to prox LAD, 2.5 x 12 mm Promus DES to mid LAD. // Myoview 7/21: EF 20, no ischemia; high risk   . Chest pain   . Chronic systolic CHF (  congestive heart failure) ([place]) 03/21/2016   Echo 01/30/17: Diff HK, mild focal basal septal hypertrophy, EF 30-35, mild AI, MAC, mild MR // Echo 06/08/16: Mild focal basal septal hypertrophy, EF 25-30%, diff HK, ant-septal AK, Gr 1 DD, mild AI, MAC, mild MR, PASP 37 mmHg // Echo 03/18/16: EF 30-35%, ant-septal AK, Gr 1 DD, mild MR, severe LAE.  [name] Constipation   . Depression   . Diabetes mellitus type 2 in obese ([place])   . Dizziness   . Glaucoma   . Gout   . Heart disease   . Heartburn   . History of acute anterior wall MI 03/17/2016  . History of heart attack   . Hyperlipemia   . Hypertension   . Ischemic cardiomyopathy 10/02/2016   Refused ICD  . Joint pain   . Left bundle branch block   . Myocardial infarction ([place])   . Nausea   . Sleep apnea   . SOB (shortness of breath)   . Suicidal ideation  08/27/2018  . Swelling    feet or legs    Current Outpatient Medications  Medication Sig Dispense Refill  . albuterol (VENTOLIN HFA) 108 (90 Base) MCG/ACT inhaler Inhale 2 puffs into the lungs every 6 (six) hours as needed for shortness of breath.     [name] atorvastatin (LIPITOR) 80 MG tablet Take 1 tablet by mouth once daily 90 tablet 3  . blood glucose meter kit and supplies KIT Dispense based on patient and insurance preference.  Check glucose once daily fasting in the morning. (FOR ICD 10 E11.9). 1 each 0  . carvedilol (COREG) 12.5 MG tablet TAKE 1 TABLET BY MOUTH TWICE DAILY WITH MEALS 180 tablet 3  . clopidogrel (PLAVIX) 75 MG tablet Take 1 tablet (75 mg total) by mouth daily. 90 tablet 1  . dorzolamide-timolol (COSOPT) 22.3-6.8 MG/ML ophthalmic solution Place 1 drop into both eyes 2 (two) times daily.     . furosemide (LASIX) 40 MG tablet Take 1 tablet (40 mg total) by mouth daily. 90 tablet 3  . hydrOXYzine (ATARAX/VISTARIL) 10 MG tablet Take 1 tablet (10 mg total) by mouth 3 (three) times daily as needed. 30 tablet 0  . isosorbide mononitrate (IMDUR) 30 MG 24 hr tablet Take 1 tablet (30 mg total) by mouth daily. 90 tablet 3  . latanoprost (XALATAN) 0.005 % ophthalmic solution Place 1 drop into both eyes at bedtime.    [name] lurasidone (LATUDA) 40 MG TABS tablet Take 40 mg by mouth daily with breakfast.    . metFORMIN (GLUCOPHAGE) 500 MG tablet TAKE 1 TABLET BY MOUTH TWICE DAILY WITH A MEAL 180 tablet 0  . nitroGLYCERIN (NITROSTAT) 0.4 MG SL tablet DISSOLVE ONE TABLET UNDER THE TONGUE EVERY 5 MINUTES AS NEEDED FOR CHEST PAIN.  DO NOT EXCEED A TOTAL OF 3 DOSES IN 15 MINUTES 25 tablet 10  . OLANZapine (ZYPREXA) 5 MG tablet Take 1 tablet (5 mg total) by mouth at bedtime. For anxiety and sleep 30 tablet 1  . spironolactone (ALDACTONE) 25 MG tablet Take 1/2 (one-half) tablet by mouth once daily 45 tablet 3  . traZODone (DESYREL) 100 MG tablet Take 100 mg by mouth at bedtime as needed for sleep.     . valsartan (DIOVAN) 40 MG tablet Take 1 tablet (40 mg total) by mouth 2 (two) times daily. 180 tablet 1  . venlafaxine XR (EFFEXOR-XR) 150 MG 24 hr capsule TAKE 2 CAPSULES BY MOUTH ONCE DAILY (NEEDS to follow-up with psychiatry for further refills) 60  capsule 0   No current facility-administered medications for this encounter.    Allergies  Allergen Reactions  . Tetracycline Swelling  . Meperidine Nausea And Vomiting    Nausea      Social History   Socioeconomic History  . Marital status: Married    Spouse name: Sherolyn Trettin  . Number of children: 0  . Years of education: BS in education  . Highest education level: Not on file  Occupational History  . Occupation:  Retired Education officer, museum  Tobacco Use  . Smoking status: Former Smoker    Types: Cigarettes    Quit date: 04/13/1997    Years since quitting: 23.7  . Smokeless tobacco: Never Used  Vaping Use  . Vaping Use: Never used  Substance and Sexual Activity  . Alcohol use: Yes    Alcohol/week: 1.0 standard drink    Types: 1 Glasses of wine per week    Comment: 2-3 glasses of wine per month  . Drug use: No  . Sexual activity: Not Currently  Other Topics Concern  . Not on file  Social History Narrative   Lives in [place] with spouse.  No children.   Retired first [employer] for over 30 years ([employer] for 10 years and then in [employer] for over 20 years).   Left-handed   Social Determinants of Health   Financial Resource Strain: Low Risk   . Difficulty of Paying Living Expenses: Not hard at all  Food Insecurity: No Food Insecurity  . Worried About Charity fundraiser in the Last Year: Never true  . Ran Out of Food in the Last Year: Never true  Transportation Needs: No Transportation Needs  . Lack of Transportation (Medical): No  . Lack of Transportation (Non-Medical): No  Physical Activity: Not on file  Stress: No Stress Concern Present  . Feeling of Stress : Only a little  Social Connections:  Not on file  Intimate Partner Violence: Not on file      Family History  Problem Relation Age of Onset  . Anxiety disorder Mother   . Paranoid behavior Mother   . Hypertension Mother   . Heart failure Mother   . Stroke Mother   . Dementia Mother   . High Cholesterol Mother   . Diabetes Mother   . Hyperlipidemia Mother   . Heart disease Mother   . Depression Mother   . Hypertension Father   . High Cholesterol Father   . Mood Disorder Sister   . Stroke Sister   . Anxiety disorder Maternal Aunt   . Drug abuse Cousin   . Tuberculosis Paternal Grandfather     Vitals:   01/23/21 1206  BP: 132/80  Pulse: 74  SpO2: 100%  Weight: 75.5 kg (166 lb 6.4 oz)    PHYSICAL EXAM: General:  Well appearing. No respiratory difficulty HEENT: normal Neck: supple. no JVD. Carotids 2+ bilat; no bruits. No lymphadenopathy or thryomegaly appreciated. Cor: PMI nondisplaced. Regular rate & rhythm. No rubs, gallops or murmurs. Lungs: clear Abdomen: soft, nontender, nondistended. No hepatosplenomegaly. No bruits or masses. Good bowel sounds. Extremities: no cyanosis, clubbing, rash, edema Neuro: alert & oriented x 3, cranial nerves grossly intact. moves all 4 extremities w/o difficulty. Affect pleasant.  ECG: NSR 71 LBBB 164 ms Personally reviewed  ASSESSMENT & PLAN:   1. Chronic systolic HF - due to  Ischemic CM. S/p Anterior STEMI 5/17  - Echocardiogram 8/19: EF 30-35% - Echo 5/21 EF < 20%  She has  declined CRT-D.   - Stable NYHA III - Volume status stable on lasix 40 daily.  - Continue carvedilol 12.5 bid - Increase valsartan to 80 bid (could not afford Entresto) - Continue spiro 12.5 daily  - Off [name] due to cost.  - Given wide LBBB and worsening LV function,  I wonder if her LV dysfunction is due mostly to her LAD infarct or if there also is a component of LBBB CM. Will get cMRI to further assess. If only moderate LGE would strongly consider BiVICD if she will consider.  - Will  have her f/u with PharmD for medication titration and assistance with affording SGLT2i and ARNI.   2. Coronary artery disease involving native coronary artery of native heart without angina pectoris - Hx of anterior STEMI in 5/17 tx with DES x 2 to the LAD. - Myoview 7/21  EF 20% no ischemiad.   - No s/s angina - Continue carvedilol, clopidogrel, atorvastatin.   3. Essential hypertension - controlled   4. LBBB - plan as above  Glori Bickers, MD  12:17 PM

## 2021-01-23 ENCOUNTER — Ambulatory Visit (HOSPITAL_COMMUNITY)
Admission: RE | Admit: 2021-01-23 | Discharge: 2021-01-23 | Disposition: A | Discharge status: Home / Self Care | Payer: Medicare Other | Source: Ambulatory Visit | Attending: Internal Medicine | Admitting: Internal Medicine

## 2021-01-23 ENCOUNTER — Other Ambulatory Visit: Payer: Self-pay

## 2021-01-23 ENCOUNTER — Encounter (HOSPITAL_COMMUNITY): Payer: Self-pay | Admitting: Internal Medicine

## 2021-01-23 VITALS — BP 132/80 | HR 74 | Wt 166.4 lb

## 2021-01-23 DIAGNOSIS — I255 Ischemic cardiomyopathy: Secondary | ICD-10-CM | POA: Diagnosis not present

## 2021-01-23 DIAGNOSIS — I447 Left bundle-branch block, unspecified: Secondary | ICD-10-CM

## 2021-01-23 DIAGNOSIS — Z8673 Personal history of transient ischemic attack (TIA), and cerebral infarction without residual deficits: Secondary | ICD-10-CM | POA: Diagnosis not present

## 2021-01-23 DIAGNOSIS — Z7984 Long term (current) use of oral hypoglycemic drugs: Secondary | ICD-10-CM | POA: Diagnosis not present

## 2021-01-23 DIAGNOSIS — I252 Old myocardial infarction: Secondary | ICD-10-CM | POA: Insufficient documentation

## 2021-01-23 DIAGNOSIS — Z8249 Family history of ischemic heart disease and other diseases of the circulatory system: Secondary | ICD-10-CM | POA: Diagnosis not present

## 2021-01-23 DIAGNOSIS — R0602 Shortness of breath: Secondary | ICD-10-CM | POA: Insufficient documentation

## 2021-01-23 DIAGNOSIS — K760 Fatty (change of) liver, not elsewhere classified: Secondary | ICD-10-CM | POA: Diagnosis not present

## 2021-01-23 DIAGNOSIS — Z7901 Long term (current) use of anticoagulants: Secondary | ICD-10-CM | POA: Insufficient documentation

## 2021-01-23 DIAGNOSIS — Z7902 Long term (current) use of antithrombotics/antiplatelets: Secondary | ICD-10-CM | POA: Diagnosis not present

## 2021-01-23 DIAGNOSIS — E785 Hyperlipidemia, unspecified: Secondary | ICD-10-CM | POA: Insufficient documentation

## 2021-01-23 DIAGNOSIS — I5022 Chronic systolic (congestive) heart failure: Secondary | ICD-10-CM | POA: Diagnosis not present

## 2021-01-23 DIAGNOSIS — R42 Dizziness and giddiness: Secondary | ICD-10-CM | POA: Diagnosis not present

## 2021-01-23 DIAGNOSIS — I10 Essential (primary) hypertension: Secondary | ICD-10-CM | POA: Diagnosis not present

## 2021-01-23 DIAGNOSIS — Z955 Presence of coronary angioplasty implant and graft: Secondary | ICD-10-CM | POA: Diagnosis not present

## 2021-01-23 DIAGNOSIS — Z87891 Personal history of nicotine dependence: Secondary | ICD-10-CM | POA: Diagnosis not present

## 2021-01-23 DIAGNOSIS — E669 Obesity, unspecified: Secondary | ICD-10-CM | POA: Insufficient documentation

## 2021-01-23 DIAGNOSIS — E119 Type 2 diabetes mellitus without complications: Secondary | ICD-10-CM | POA: Insufficient documentation

## 2021-01-23 DIAGNOSIS — Z79899 Other long term (current) drug therapy: Secondary | ICD-10-CM | POA: Diagnosis not present

## 2021-01-23 DIAGNOSIS — I2510 Atherosclerotic heart disease of native coronary artery without angina pectoris: Secondary | ICD-10-CM | POA: Diagnosis not present

## 2021-01-23 DIAGNOSIS — I110 Hypertensive heart disease with heart failure: Secondary | ICD-10-CM | POA: Insufficient documentation

## 2021-01-23 MED ORDER — VALSARTAN 80 MG PO TABS: 80.0000 mg | ORAL_TABLET | Freq: Every day | ORAL | 6 refills | Status: DC

## 2021-01-23 MED ORDER — VALSARTAN 80 MG PO TABS: 80.0000 mg | ORAL_TABLET | Freq: Two times a day (BID) | ORAL | 6 refills | Status: DC

## 2021-01-23 NOTE — Patient Instructions (Addendum)
Increase Valsartan to 80 mg Twice daily   Your physician has requested that you have a cardiac MRI. Cardiac MRI uses a computer to create images of your heart as its beating, producing both still and moving pictures of your heart and major blood vessels. For further information please visit [email or web address] Please follow the instruction sheet given to you today for more information. ONCE APPROVED BY YOUR INSURANCE COMPANY WE WILL CALL YOU TO SCHEDULE THIS  Please follow up with our heart failure pharmacist in 4 weeks  Your physician recommends that you schedule a follow-up appointment in: 3 months  If you have any questions or concerns before your next appointment please send [name] a message through [place] or call our office at [phone].    TO LEAVE A MESSAGE FOR THE NURSE SELECT OPTION 2, PLEASE LEAVE A MESSAGE INCLUDING: . YOUR NAME . DATE OF BIRTH . CALL BACK NUMBER . REASON FOR CALL**this is important as we prioritize the call backs  [place] AS LONG AS YOU CALL BEFORE 4:00 PM  At the [facility], you and your health needs are our priority. As part of our continuing mission to provide you with exceptional heart care, we have created designated Provider Care Teams. These Care Teams include your primary Cardiologist (physician) and Advanced Practice Providers (APPs- Physician Assistants and Nurse Practitioners) who all work together to provide you with the care you need, when you need it.   You may see any of the following providers on your designated Care Team at your next follow up: [name] Dr Glori Bickers . Dr Loralie Champagne . Dr Vickki Muff . Darrick Grinder, NP . Lyda Jester, [place] . Audry Riles, PharmD   Please be sure to bring in all your medications bottles to every appointment.

## 2021-02-04 DIAGNOSIS — Z23 Encounter for immunization: Secondary | ICD-10-CM | POA: Diagnosis not present

## 2021-02-07 ENCOUNTER — Other Ambulatory Visit (HOSPITAL_COMMUNITY): Payer: Self-pay

## 2021-02-13 ENCOUNTER — Telehealth: Payer: Self-pay

## 2021-02-13 NOTE — Telephone Encounter (Signed)
Pt dropped off form for medical clearance to get CoolSculpting at [facility]. Placed in folder up front. Please call pt to pick up at [phone]

## 2021-02-14 NOTE — Telephone Encounter (Signed)
The patient called back and stated that she has been off the Plavix for 2 days because it is a requirement.  so she can have the procedure. Please advise.  Dominic Mahaney

## 2021-02-14 NOTE — Telephone Encounter (Signed)
Based on review of the form she cannot have this procedure as she is on Plavix.  She should continue on the Plavix and refrain from having this procedure.

## 2021-02-14 NOTE — Telephone Encounter (Signed)
Pt dropped off form for medical clearance to get CoolSculpting at [facility]. Placed in folder up front. Please call pt to pick up at [phone]. Form is in sign basket.  Glen Kesinger,cma

## 2021-02-15 NOTE — Telephone Encounter (Signed)
I called to [facility] and asked them about the Plavix and the nurse there stated that since the patient is on the plavix for 3 different diagnoses they wanted your input on how to precede and if you feel she does not need to stop her plavix for this procedure and to not have it it is perfectly fine with them, her health is more important.  The patient was called and she is scheduled to see you to discuss the first week of May.  Kendle Turbin,cma

## 2021-02-15 NOTE — Telephone Encounter (Signed)
Noted. Does she have the contact information for the place that is supposed to be doing this procedure? Based on what it says in the form she provided we would need to contact them to confirm the issue with the plavix. She will also need to come in to the office to discuss this as well as she will need an exam with me if I decide it is ok to proceed with this.

## 2021-02-15 NOTE — Telephone Encounter (Signed)
Pt returned your call.  

## 2021-02-15 NOTE — Telephone Encounter (Signed)
Pt call returning your call

## 2021-02-20 NOTE — Telephone Encounter (Signed)
Plan to discuss at visit in May.

## 2021-02-21 NOTE — Progress Notes (Incomplete)
***In Progress***  HPI:  Melanie Ray a 70 y.o.femalewith the following medical issues. She was referred by Dr. Burt Knack for further assessment and managemt.   Coronary artery disease  ? S/p Anterior STEMI 03/2016 >> PCI: DES x 2 to LAD ? Myoview 05/2020: EF 20, no ischemia  Heart failure with reduced ejection fraction  ? Ischemic CM ? EF 30-35 at time of MI 03/2016 >> DC on Lifevest >> Pt DC'd at FU visit ? Echocardiogram 06/2016: EF 25-30 ? Echocardiogram 01/2017: EF 30-35 ? Echocardiogram 06/2018: EF 30-35 ? EP eval (J Allred) x 2 >> pt declined CRT-D ? Intol of Entresto ? Echocardiogram 03/2020: EF < 20% ? Myoview 05/2020 EF 20% no ischemia  Obsessive Compulsive d/o  Diabetes mellitus 2  Hypertension   Hyperlipidemia   LBBB  OSA  Hepatic steatosis  Hx of cerebellar CVA 03/2020  Bilateral vertebral artery stenosis   She recently presented to HF clinic on 01/23/21. She was doing ok. Reported having good days and bad days. SOB with mild activity, but stated she has been walking on TM for 8 mins every day for last 6 weeks and feeling better. Not able to go to store  Reported getting dizzy frequently. Could happen at any time. Had been chronic for her prior to her heart attack. No CP. No orthopnea or PND. Stopped [name] due to cost.   Today she returns to HF clinic for pharmacist medication titration. At last visit with MD, valsartan was increased to 80 mg BID.***   Overall feeling ***. Dizziness, lightheadedness, fatigue:  Chest pain or palpitations:  How is your breathing?: *** SOB: Able to complete all ADLs. Activity level ***  Weight at home pounds. Takes furosemide/torsemide/bumex *** mg *** daily.  LEE PND/Orthopnea  Appetite *** Low-salt diet:   Physical Exam Cost/affordability of meds   -labs? -plan? -f/u?     HF Medications: Carvedilol 12.5 mg BID Valsartan 80 mg BID Spironolactone 12.5 mg daily Isosorbide mononitrate 30 mg  daily Furosemide 40 mg daily  Has the patient been experiencing any side effects to the medications prescribed?  {YES NO:22349}  Does the patient have any problems obtaining medications due to transportation or finances?   {YES NO:22349}  Understanding of regimen: {excellent/good/fair/poor:19665} Understanding of indications: {excellent/good/fair/poor:19665} Potential of compliance: {excellent/good/fair/poor:19665} Patient understands to avoid NSAIDs. Patient understands to avoid decongestants.    Pertinent Lab Values: . Serum creatinine ***, BUN ***, Potassium ***, Sodium ***, BNP ***, Magnesium ***, Digoxin ***   Vital Signs: . Weight: *** (last clinic weight: ***) . Blood pressure: ***  . Heart rate: ***   Assessment/Plan: 1. Chronic systolic HF - due to  Ischemic CM. S/p Anterior STEMI 5/17  - Echocardiogram 8/19: EF 30-35% - Echo 5/21 EF < 20% She has declined CRT-D.  - Stable NYHA III - Volume status stable on lasix 40 daily.  - Continue carvedilol 12.5 bid - Increase valsartan to 80 bid (could not afford Entresto) - Continue spiro 12.5 daily  - Off [name] due to cost.  - Given wide LBBB and worsening LV function,  I wonder if her LV dysfunction is due mostly to her LAD infarct or if there also is a component of LBBB CM. Will get cMRI to further assess. If only moderate LGE would strongly consider BiVICD if she will consider.  - Will have her f/u with PharmD for medication titration and assistance with affording SGLT2i and ARNI.   2. Coronary artery disease involving native coronary  artery of native heart without angina pectoris - Hx of anterior STEMI in 5/17 tx with DES x 2 to the LAD. - Myoview 7/21  EF 20% no ischemiad. - No s/s angina -Continue carvedilol, clopidogrel, atorvastatin.   3. Essential hypertension - controlled   4. LBBB - plan as above    Esmeralda Links (PharmD Candidate 2022)  Audry Riles, PharmD, BCPS, [facility], CPP Heart Failure  Clinic Pharmacist [phone]

## 2021-02-22 ENCOUNTER — Other Ambulatory Visit (HOSPITAL_COMMUNITY): Payer: Self-pay

## 2021-02-23 ENCOUNTER — Ambulatory Visit (HOSPITAL_COMMUNITY)
Admission: RE | Admit: 2021-02-23 | Discharge: 2021-02-23 | Disposition: A | Discharge status: Home / Self Care | Payer: Medicare Other | Source: Ambulatory Visit | Attending: Internal Medicine | Admitting: Internal Medicine

## 2021-02-23 ENCOUNTER — Other Ambulatory Visit: Payer: Self-pay

## 2021-02-23 VITALS — BP 148/80 | HR 76 | Wt 167.2 lb

## 2021-02-23 DIAGNOSIS — I447 Left bundle-branch block, unspecified: Secondary | ICD-10-CM | POA: Insufficient documentation

## 2021-02-23 DIAGNOSIS — I252 Old myocardial infarction: Secondary | ICD-10-CM | POA: Insufficient documentation

## 2021-02-23 DIAGNOSIS — E785 Hyperlipidemia, unspecified: Secondary | ICD-10-CM | POA: Insufficient documentation

## 2021-02-23 DIAGNOSIS — I5022 Chronic systolic (congestive) heart failure: Secondary | ICD-10-CM | POA: Diagnosis not present

## 2021-02-23 DIAGNOSIS — I110 Hypertensive heart disease with heart failure: Secondary | ICD-10-CM | POA: Insufficient documentation

## 2021-02-23 DIAGNOSIS — I255 Ischemic cardiomyopathy: Secondary | ICD-10-CM | POA: Insufficient documentation

## 2021-02-23 LAB — BASIC METABOLIC PANEL WITH GFR
Anion gap: 9 (ref 5–15)
BUN: 14 mg/dL (ref 8–23)
CO2: 23 mmol/L (ref 22–32)
Calcium: 9.7 mg/dL (ref 8.9–10.3)
Chloride: 105 mmol/L (ref 98–111)
Creatinine, Ser: 1.08 mg/dL — ABNORMAL HIGH (ref 0.44–1.00)
GFR, Estimated: 55 mL/min — ABNORMAL LOW
Glucose, Bld: 118 mg/dL — ABNORMAL HIGH (ref 70–99)
Potassium: 4.1 mmol/L (ref 3.5–5.1)
Sodium: 137 mmol/L (ref 135–145)

## 2021-02-23 MED ORDER — FUROSEMIDE 40 MG PO TABS: 20.0000 mg | ORAL_TABLET | Freq: Every day | ORAL | 3 refills | Status: DC

## 2021-02-23 MED ORDER — ENTRESTO 49-51 MG PO TABS: 1.0000 | ORAL_TABLET | Freq: Two times a day (BID) | ORAL | 3 refills | Status: DC

## 2021-02-23 NOTE — Progress Notes (Signed)
Referring Physician: Dr. Burt Knack Primary Care: Leone Haven, MD Primary Cardiologist: Sherren Mocha, MD  HPI:  Ms. Melanie Mahurin Stricklandis a 70 y.o.femalewith the following medical issues. She was referred by Dr. Burt Knack for further assessment and management.   Coronary artery disease  ? S/p Anterior STEMI 03/2016 >> PCI: DES x 2 to LAD ? Myoview 05/2020: EF 20%, no ischemia  Heart failure with reduced ejection fraction  ? Ischemic CM ? EF 30-35% at time of MI 03/2016 >> DC on Lifevest >> Pt DC'd at FU visit ? Echocardiogram 06/2016: EF 25-30% ? Echocardiogram 01/2017: EF 30-35% ? Echocardiogram 06/2018: EF 30-35% ? EP eval (J Allred) x 2 >> pt declined CRT-D ? Intol of Entresto ? Echocardiogram 03/2020: EF < 20% ? Myoview 05/2020 EF 20% no ischemia  Obsessive Compulsive d/o  Diabetes mellitus 2  Hypertension   Hyperlipidemia   LBBB  OSA  Hepatic steatosis  Hx of cerebellar CVA 03/2020  Bilateral vertebral artery stenosis  She recently presented to HF clinic on 01/23/21. She was doing ok. Reported having good days and bad days. SOB with mild activity, but stated she has been walking on treadmill for 8 minutes every day for last 6 weeks and feeling better. Not able to go to store  Reported getting dizzy frequently. Could happen at any time. Had been chronic for her prior to her heart attack. No CP. No orthopnea or PND. Stopped [name] due to cost.  Today she returns to HF clinic for pharmacist medication titration. At last visit with MD, valsartan was increased to 80 mg BID. Patient is feeling well overall. She denies worsening of dizziness, lightheadedness, or fatigue. Denies chest pain or palpitations. Breathing is sometimes more labored, but this will occur randomly and does not correlate with any activities. Patient continues to walk at least 10 minutes daily and is usually able to do so without SOB. Patient reports stable weight at home in mid-160 lbs. Sometimes may  have LEE in ankles/feet with salty foods. This usually resolves on its own and patient has not needed any extra doses of furosemide. Patient knows she eats too much salt but is trying to cut back. Patient is taking all medications as prescribed.  HF Medications: Carvedilol 12.5 mg BID Valsartan 80 mg BID Spironolactone 12.5 mg daily Isosorbide mononitrate 30 mg daily Furosemide 40 mg daily  Has the patient been experiencing any side effects to the medications prescribed?  No   Does the patient have any problems obtaining medications due to transportation or finances?   Yes, patient has had trouble affording Entresto and [name] in the past due to high copay with her [facility] plan. Test claims for [name] and [name] were all ~$500/month. Copays may decrease to $47 when deductible is met.  Obtained patient signature today to apply for manufacturer's assistance from [name] for [name]. Also supplied patient with 30 days of Entresto 49/51 mg free samples (Lot [id number], Exp. Date 02/2023), as well as a 30-day free trial card.  Of note, I think it is unlikely patient would be approved to receive either [name] or [name] for patient assistance in the future given her income. [name]) has higher income threshold, so hopefully will be approved. I encouraged her to reach out to [name] during Medicare enrollment next year to see if there is a Medicare plan that would better suit her current medication needs.   Understanding of regimen: good Understanding of indications: good Potential of  compliance: good Patient understands to avoid NSAIDs. Patient understands to avoid decongestants.    Pertinent Lab Values: . 01/19/21: Serum creatinine 1.27, Potassium 4.3, NT-ProBNP 1,682 (down from 2,842 on 01/10/21) . 02/23/21: Serum creatinine 1.08, Potassium 4.1  Vital Signs: . Weight: 167 lbs (last clinic weight: 166 lbs) . Blood  pressure: 148/80  . Heart rate: 76   Assessment/Plan: 1. Chronic systolic HF - Due to Ischemic CM. S/p Anterior STEMI 03/2016  - Echocardiogram 06/2018: EF 30-35% - Echo 03/2020 EF < 20% She has declined CRT-D.  - Stable NYHA III - Volume status stable. Euvolemic on exam today. - Decrease furosemide to 20 daily with initiation of Entresto. - Continue carvedilol 12.5 mg BID. - Discontinue valsartan 80 mg BID and initiate Entresto 49/51 mg BID. BMET stable today. Repeat BMET at next visit. [employer] patient assistance pending.  - Continue spironolactone 12.5 mg daily. - Off [name] due to cost. See note above - I think it is unlikely patient will qualify for either [name] or [name] patient assistance given her household income. Could qualify for a grant, but with her copay's being ~$500/month, she would likely run through the grant too quickly to be helpful, and all grants are currently closed. Encouraged her to meet with [name] during Medicare re-enrollment to see if a different insurance plan for next year would give her better coverage.  - Given wide LBBB and worsening LV function,  LV dysfunction could be due to her LAD infarct or there may be a component of LBBB CM. Planned to get cMRI to further assess. If only moderate LGE, would strongly consider BiVICD if she will consider.   2. Coronary artery disease involving native coronary artery of native heart without angina pectoris - Hx of anterior STEMI in 03/2016 tx with DES x 2 to the LAD. - Myoview 05/2020  EF 20% no ischemia. - No s/s angina -Continue carvedilol, clopidogrel, atorvastatin.  3. Essential hypertension - BP slightly elevated today - Discontinue valsartan 80 mg BID and initiate Entresto 49/51 mg BID.  4. LBBB - Plan as above  Patient will follow up with pharmacy clinic on 04/06/21. Plan to recheck BMET then.   Esmeralda Links (PharmD Candidate 2022)  Audry Riles, PharmD, BCPS, BCCP, CPP Heart Failure Clinic  Pharmacist [phone]

## 2021-02-23 NOTE — Patient Instructions (Signed)
It was a pleasure seeing you today!  MEDICATIONS: -We are changing your medications today -Stop Valsartan -Start Entresto 49/51 mg (1 tablet) twice daily -Decrease furosemide to 20 mg daily -Call if you have questions about your medications.  LABS: -We will call you if your labs need attention.  NEXT APPOINTMENT: Return to clinic in 4 weeks with Pharmacy Clinic.  In general, to take care of your heart failure: -Limit your fluid intake to 2 Liters (half-gallon) per day.   -Limit your salt intake to ideally 2-3 grams (2000-3000 mg) per day. -Weigh yourself daily and record, and bring that "weight diary" to your next appointment.  (Weight gain of 2-3 pounds in 1 day typically means fluid weight.) -The medications for your heart are to help your heart and help you live longer.   -Please contact us before stopping any of your heart medications.  Call the clinic at [phone] with questions or to reschedule future appointments.

## 2021-02-24 ENCOUNTER — Other Ambulatory Visit: Payer: Self-pay

## 2021-02-24 DIAGNOSIS — E119 Type 2 diabetes mellitus without complications: Secondary | ICD-10-CM

## 2021-02-24 DIAGNOSIS — I639 Cerebral infarction, unspecified: Secondary | ICD-10-CM

## 2021-02-24 MED ORDER — METFORMIN HCL 500 MG PO TABS: 1.0000 | ORAL_TABLET | Freq: Two times a day (BID) | ORAL | 1 refills | Status: DC

## 2021-02-24 MED ORDER — CLOPIDOGREL BISULFATE 75 MG PO TABS: 75.0000 mg | ORAL_TABLET | Freq: Every day | ORAL | 1 refills | Status: DC

## 2021-02-27 ENCOUNTER — Telehealth: Payer: Self-pay | Admitting: Cardiovascular Disease

## 2021-02-27 ENCOUNTER — Telehealth (HOSPITAL_COMMUNITY): Payer: Self-pay | Admitting: Pharmacist

## 2021-02-27 ENCOUNTER — Other Ambulatory Visit: Payer: Self-pay

## 2021-02-27 MED ORDER — SPIRONOLACTONE 25 MG PO TABS: ORAL_TABLET | ORAL | 3 refills | Status: DC

## 2021-02-27 MED ORDER — FUROSEMIDE 40 MG PO TABS: 40.0000 mg | ORAL_TABLET | Freq: Every day | ORAL | 3 refills | Status: DC

## 2021-02-27 MED ORDER — ISOSORBIDE MONONITRATE ER 30 MG PO TB24: 30.0000 mg | ORAL_TABLET | Freq: Every day | ORAL | 3 refills | Status: DC

## 2021-02-27 MED ORDER — VALSARTAN 80 MG PO TABS: 80.0000 mg | ORAL_TABLET | Freq: Two times a day (BID) | ORAL | 3 refills | Status: DC

## 2021-02-27 NOTE — Telephone Encounter (Signed)
Please advise 

## 2021-02-27 NOTE — Telephone Encounter (Signed)
Received message that patient was having difficulty breathing/SOB since starting the [facility]. No lip/face swelling. No weight gain, LEE, PND or orthopnea. Instructed patient to STOP Entresto and restart valsartan 80 mg BID. Will also resume previous dose of furosemide 40 mg daily as this was only decreased to accomodate likely diuretic effect with Entresto. Updated medication list and sent the new prescriptions to [facility] per patient request. Patient is aware and expressed understanding.    Audry Riles, PharmD, BCPS, BCCP, CPP Heart Failure Clinic Pharmacist [phone]

## 2021-02-27 NOTE — Telephone Encounter (Signed)
Pt c/o medication issue:  1. Name of Medication: sacubitril-valsartan (ENTRESTO) 49-51 MG  2. How are you currently taking this medication (dosage and times per day)? As directed   3. Are you having a reaction (difficulty breathing--STAT)? yes  4. What is your medication issue? Patient states that she thinks this medication is causing her to have SOB. Denies having trouble breathing. She wants to know if she can go back on Diovan. Please advise.

## 2021-02-28 ENCOUNTER — Other Ambulatory Visit: Payer: Self-pay

## 2021-03-01 ENCOUNTER — Other Ambulatory Visit: Payer: Self-pay

## 2021-03-01 ENCOUNTER — Telehealth (HOSPITAL_COMMUNITY): Payer: Self-pay | Admitting: Pharmacy Technician

## 2021-03-01 MED ORDER — ATORVASTATIN CALCIUM 80 MG PO TABS: 1.0000 | ORAL_TABLET | Freq: Every day | ORAL | 3 refills | Status: DC

## 2021-03-01 MED ORDER — CARVEDILOL 12.5 MG PO TABS: 12.5000 mg | ORAL_TABLET | Freq: Two times a day (BID) | ORAL | 3 refills | Status: DC

## 2021-03-01 NOTE — Telephone Encounter (Signed)
This is a CHF pt 

## 2021-03-01 NOTE — Telephone Encounter (Signed)
Advanced Heart Failure Patient Advocate Encounter   Patient was approved to receive Entresto from [name]  Patient ID: [id number] Effective dates: 02/27/21 through 11/04/21  The patient has had an adverse reaction to [facility] and has since been taken off of the medication. I called and spoke with [employer] and confirmed that the patient would not be taking the medication and can be inactivated from the program.   Charlann Boxer, CPhT

## 2021-03-02 MED ORDER — FUROSEMIDE 40 MG PO TABS: 40.0000 mg | ORAL_TABLET | Freq: Every day | ORAL | 1 refills | Status: DC

## 2021-03-06 ENCOUNTER — Encounter: Payer: Self-pay | Admitting: Family Medicine

## 2021-03-06 ENCOUNTER — Other Ambulatory Visit: Payer: Self-pay

## 2021-03-06 ENCOUNTER — Ambulatory Visit (INDEPENDENT_AMBULATORY_CARE_PROVIDER_SITE_OTHER): Payer: Medicare Other | Admitting: Family Medicine

## 2021-03-06 DIAGNOSIS — Z01818 Encounter for other preprocedural examination: Secondary | ICD-10-CM | POA: Diagnosis not present

## 2021-03-06 DIAGNOSIS — I255 Ischemic cardiomyopathy: Secondary | ICD-10-CM

## 2021-03-06 NOTE — Progress Notes (Signed)
Melanie Rumps, MD Phone: [phone]  Melanie Ray is a 70 y.o. female who presents today for follow-up.  Preprocedural evaluation: The patient reports she is going to undergo cool sculpting to remove abdominal fat cells.  She has seen a dermatologist for this.  She is on Plavix and this is one of the contraindications to the procedure.  It appears she would need to come off of this to be able to have this completed.  She notes no chest pain, shortness of breath, or leg swelling.  She does have a history of stroke, heart attack, and heart failure.  She does follow with cardiology in the advanced heart failure specialist.  Social History   Tobacco Use  Smoking Status Former Smoker  . Types: Cigarettes  . Quit date: 04/13/1997  . Years since quitting: 23.9  Smokeless Tobacco Never Used    Current Outpatient Medications on File Prior to Visit  Medication Sig Dispense Refill  . albuterol (VENTOLIN HFA) 108 (90 Base) MCG/ACT inhaler Inhale 2 puffs into the lungs every 6 (six) hours as needed for shortness of breath.     [name] atorvastatin (LIPITOR) 80 MG tablet Take 1 tablet (80 mg total) by mouth daily. 90 tablet 3  . blood glucose meter kit and supplies KIT Dispense based on patient and insurance preference.  Check glucose once daily fasting in the morning. (FOR ICD 10 E11.9). 1 each 0  . carvedilol (COREG) 12.5 MG tablet Take 1 tablet (12.5 mg total) by mouth 2 (two) times daily with a meal. 180 tablet 3  . clopidogrel (PLAVIX) 75 MG tablet Take 1 tablet (75 mg total) by mouth daily. 90 tablet 1  . dorzolamide-timolol (COSOPT) 22.3-6.8 MG/ML ophthalmic solution Place 1 drop into both eyes 2 (two) times daily.     . furosemide (LASIX) 40 MG tablet Take 1 tablet (40 mg total) by mouth daily. 90 tablet 1  . hydrOXYzine (ATARAX/VISTARIL) 10 MG tablet Take 1 tablet (10 mg total) by mouth 3 (three) times daily as needed. 30 tablet 0  . isosorbide mononitrate (IMDUR) 30 MG 24 hr tablet Take  1 tablet (30 mg total) by mouth daily. 90 tablet 3  . latanoprost (XALATAN) 0.005 % ophthalmic solution Place 1 drop into both eyes at bedtime.    [name] lurasidone (LATUDA) 40 MG TABS tablet Take 40 mg by mouth daily with breakfast.    . metFORMIN (GLUCOPHAGE) 500 MG tablet Take 1 tablet (500 mg total) by mouth 2 (two) times daily with a meal. 180 tablet 1  . nitroGLYCERIN (NITROSTAT) 0.4 MG SL tablet DISSOLVE ONE TABLET UNDER THE TONGUE EVERY 5 MINUTES AS NEEDED FOR CHEST PAIN.  DO NOT EXCEED A TOTAL OF 3 DOSES IN 15 MINUTES 25 tablet 10  . OLANZapine (ZYPREXA) 5 MG tablet Take 1 tablet (5 mg total) by mouth at bedtime. For anxiety and sleep 30 tablet 1  . spironolactone (ALDACTONE) 25 MG tablet Take half a tablet by mouth once a day 45 tablet 3  . traZODone (DESYREL) 100 MG tablet Take 100 mg by mouth at bedtime as needed for sleep.    . valsartan (DIOVAN) 80 MG tablet Take 1 tablet (80 mg total) by mouth 2 (two) times daily. 180 tablet 3  . venlafaxine XR (EFFEXOR-XR) 150 MG 24 hr capsule TAKE 2 CAPSULES BY MOUTH ONCE DAILY (NEEDS to follow-up with psychiatry for further refills) 60 capsule 0   No current facility-administered medications on file prior to visit.  ROS see history of present illness  Objective  Physical Exam Vitals:   03/06/21 1622  BP: 118/70  Pulse: 90  Temp: 98.5 F (36.9 C)  SpO2: 98%    BP Readings from Last 3 Encounters:  03/06/21 118/70  02/23/21 (!) 148/80  01/23/21 132/80   Wt Readings from Last 3 Encounters:  03/06/21 165 lb 9.6 oz (75.1 kg)  02/23/21 167 lb 3.2 oz (75.8 kg)  01/23/21 166 lb 6.4 oz (75.5 kg)    Physical Exam Constitutional:      General: She is not in acute distress.    Appearance: She is not diaphoretic.  Cardiovascular:     Rate and Rhythm: Normal rate and regular rhythm.     Heart sounds: Normal heart sounds.  Pulmonary:     Effort: Pulmonary effort is normal.     Breath sounds: Normal breath sounds.  Musculoskeletal:      Right lower leg: No edema.     Left lower leg: No edema.  Skin:    General: Skin is warm and dry.  Neurological:     Mental Status: She is alert.      Assessment/Plan: Please see individual problem list.  Problem List Items Addressed This Visit    Preop examination    The patient certainly is at elevated risk to come off of the Plavix given her history of stroke and heart attack.  She is almost 12 months out from her stroke and certainly when she hits the year timeframe there could be the potential to hold the Plavix for procedure.  I do not feel comfortable advising that she can come off of the Plavix given her cardiac history and thus she will have to see cardiology to determine if this is appropriate.  We will try to arrange an appointment for her.  She will continue her Plavix for now and see cardiology.      Relevant Orders   Ambulatory referral to Cardiology      This visit occurred during the SARS-CoV-2 public health emergency.  Safety protocols were in place, including screening questions prior to the visit, additional usage of staff PPE, and extensive cleaning of exam room while observing appropriate contact time as indicated for disinfecting solutions.    Melanie Rumps, MD [place]

## 2021-03-06 NOTE — Patient Instructions (Signed)
Nice to see you. We will get you set up with a cardiologist to discuss coming off of the Plavix for the cool sculpting procedure.  If you do not hear from them about an appointment in the next week or so please let us know.

## 2021-03-06 NOTE — Assessment & Plan Note (Addendum)
The patient certainly is at elevated risk to come off of the Plavix given her history of stroke and heart attack.  She is almost 12 months out from her stroke and certainly when she hits the year timeframe there could be the potential to hold the Plavix for procedure.  I do not feel comfortable advising that she can come off of the Plavix given her cardiac history and thus she will have to see cardiology to determine if this is appropriate.  We will try to arrange an appointment for her.  She will continue her Plavix for now and see cardiology.

## 2021-03-13 ENCOUNTER — Encounter: Payer: Self-pay | Admitting: Cardiovascular Disease

## 2021-03-13 NOTE — Telephone Encounter (Signed)
error 

## 2021-03-14 ENCOUNTER — Encounter: Payer: Self-pay | Admitting: Physician Assistant

## 2021-03-14 ENCOUNTER — Other Ambulatory Visit: Payer: Self-pay

## 2021-03-14 ENCOUNTER — Ambulatory Visit (INDEPENDENT_AMBULATORY_CARE_PROVIDER_SITE_OTHER): Payer: Medicare Other | Admitting: Physician Assistant

## 2021-03-14 VITALS — BP 124/58 | HR 79 | Ht 63.0 in | Wt 166.0 lb

## 2021-03-14 DIAGNOSIS — Z01810 Encounter for preprocedural cardiovascular examination: Secondary | ICD-10-CM

## 2021-03-14 DIAGNOSIS — I2510 Atherosclerotic heart disease of native coronary artery without angina pectoris: Secondary | ICD-10-CM | POA: Diagnosis not present

## 2021-03-14 DIAGNOSIS — I10 Essential (primary) hypertension: Secondary | ICD-10-CM | POA: Diagnosis not present

## 2021-03-14 DIAGNOSIS — I5020 Unspecified systolic (congestive) heart failure: Secondary | ICD-10-CM

## 2021-03-14 DIAGNOSIS — I255 Ischemic cardiomyopathy: Secondary | ICD-10-CM | POA: Diagnosis not present

## 2021-03-14 NOTE — Progress Notes (Signed)
Cardiology Office Note:    Date:  03/14/2021   ID:  Melanie, Ray 1950-12-08, MRN [id number]  PCP:  Leone Haven, MD   [facility] Providers Cardiologist:  Sherren Mocha, MD Cardiology APP:  Sharmon Revere     Referring MD: Leone Haven, MD   Chief Complaint:  Surgical Clearance    Patient Profile:    Melanie Ray is a 70 y.o. female with:   Coronary artery disease  ? S/p Anterior STEMI 5/17 >> PCI: DES x 2 to LAD ? Myoview 7/21: EF 20, no ischemia  Heart failure with reduced ejection fraction ? Ischemic CM ? EF 30-35 at time of MI >> DC on [facility] >> Pt DC'd at FU visit ? Echocardiogram 8/17: EF 25-30 ? Echocardiogram 3/18: EF 30-35 ? Echocardiogram 8/19: EF 30-35 ? EP eval (J Allred) x 2 >> pt declined CRT-D ? Intol of Entresto ? Echocardiogram 03/2020: EF < 20  Obsessive Compulsive d/o  Diabetes mellitus 2  Hypertension   Hyperlipidemia   LBBB  OSA  Hepatic steatosis  Hx of cerebellar CVA 03/2020  Bilateral vertebral artery stenosis   Prior CV studies: Myoview 7/21:  EF 20, no ischemia; high risk  Echocardiogram 03/31/20 (w bubble study) EF < 20, global HK, mod LVH, mod LAE, mod MR, RVSP 41.4, bubble study neg  Head and neck CTA 03/31/20 1. CT head negative for acute abnormality. Small acute infarct in the left thalamus seen on MRI last night is not visualized by CT. No intracranial hemorrhage 2. Atherosclerotic aortic arch and proximal great vessels. Atherosclerotic disease in the carotid bifurcation bilaterally without significant stenosis. 3. Moderate to severe stenosis proximal right vertebral artery which is then patent to the basilar. Severe stenosis proximal left vertebral artery which is diffusely diseased and small but does contribute to the basilar. 4. Occlusion left P3 segment. Moderate stenosis left P2 segment 5. Anterior circulation widely patent. 6. Multiple thyroid nodules. The  largest nodule on the left is 20 mm. Thyroid ultrasound recommended. (Ref: J Am Coll Radiol. 2015 Feb;12(2): 143-50).  Carotid US 03/31/20 R 50-69 (no sig dz on CTA)  Echo 06/30/18 Mild conc LVH, EF 30-35, ant-sept/inf/inf-sept HK, Gr 1 DD, mild to mod AI, MAC, mobile density on MV unchanged, mild MR, mild TR, PASP 39  Echo 01/30/17 Diff HK, mild focal basal septal hypertrophy, EF 30-35, mild AI, MAC, mild MR  Echo 06/08/16 Mild focal basal septal hypertrophy, EF 25-30%, diff HK, ant-septal AK, Gr 1 DD, mild AI, MAC, mild MR, PASP 37 mmHg  Echo 03/18/16 EF 30-35%, ant-septal AK, Gr 1 DD, mild MR, severe LAE.  LHC 03/17/16 LAD proximal 80%, mid 80%, distal 50%, ostial D1 60%  LCx with LPDA lesion, 30%  RCA Mild calcification with no significant stenosis in a medium caliber, nondominant RCA LVEF is estimated at 45% with inferoapical and lateral wall akinesis PCI: PCI: 3.5 x 24 mm Promus DES to prox LAD, 2.5 x 12 mm Promus DES to mid LAD.   History of Present Illness: [name] was last seen in 2/22.  Since that time, she has seen Dr. Haroldine Laws with heart failure clinic.  She was seen by the pharmacy clinic and placed on Entresto.  However, she could not tolerate it due to shortness of breath.  Cardiac MRI is pending.  She recently saw primary care for surgical clearance.  She wants to undergo cool sculpting to remove abdominal fat cells and will need to be  off of clopidogrel.  She returns for cardiac clearance.  She wants to undergo cool sculpting.  I reviewed this with her in the room today.  This appears to be a noninvasive procedure does not require any anesthesia or sedation.  She notes that she is required to hold Plavix for 2 days prior to the procedure.  Her myocardial infarction was in 2017.  From a cardiac perspective she can certainly hold Plavix at this time for 2 days at very low risk.  She has not had any chest pain, syncope, orthopnea, leg edema.  She has been walking on  a treadmill at home and her breathing has improved.        Past Medical History:  Diagnosis Date  . Anemia   . Anxiety   . Back pain   . CAD (coronary artery disease) 04/11/2016   S/p ant STEMI 5/17: LHC >> LAD proximal 80%, mid 80%, distal 50%, ostial D1 60%; LCx with LPDA lesion 30%; RCA Mild calcification with no significant stenosis in a medium caliber, nondominant RCA; LVEF is estimated at 45% with inferoapical and lateral wall akinesis >> PCI: PCI: 3.5 x 24 mm Promus DES to prox LAD, 2.5 x 12 mm Promus DES to mid LAD. // Myoview 7/21: EF 20, no ischemia; high risk   . Chest pain   . Chronic systolic CHF (congestive heart failure) ([place]) 03/21/2016   Echo 01/30/17: Diff HK, mild focal basal septal hypertrophy, EF 30-35, mild AI, MAC, mild MR // Echo 06/08/16: Mild focal basal septal hypertrophy, EF 25-30%, diff HK, ant-septal AK, Gr 1 DD, mild AI, MAC, mild MR, PASP 37 mmHg // Echo 03/18/16: EF 30-35%, ant-septal AK, Gr 1 DD, mild MR, severe LAE.  [name] Constipation   . Depression   . Diabetes mellitus type 2 in obese ([place])   . Dizziness   . Glaucoma   . Gout   . Heart disease   . Heartburn   . History of acute anterior wall MI 03/17/2016  . History of heart attack   . Hyperlipemia   . Hypertension   . Ischemic cardiomyopathy 10/02/2016   Refused ICD  . Joint pain   . Left bundle branch block   . Myocardial infarction ([place])   . Nausea   . Sleep apnea   . SOB (shortness of breath)   . Suicidal ideation 08/27/2018  . Swelling    feet or legs    Current Medications: Current Meds  Medication Sig  . albuterol (VENTOLIN HFA) 108 (90 Base) MCG/ACT inhaler Inhale 2 puffs into the lungs every 6 (six) hours as needed for shortness of breath.   [name] atorvastatin (LIPITOR) 80 MG tablet Take 1 tablet (80 mg total) by mouth daily.  . blood glucose meter kit and supplies KIT Dispense based on patient and insurance preference.  Check glucose once daily fasting in the morning. (FOR ICD 10 E11.9).   . carvedilol (COREG) 12.5 MG tablet Take 1 tablet (12.5 mg total) by mouth 2 (two) times daily with a meal.  . clopidogrel (PLAVIX) 75 MG tablet Take 1 tablet (75 mg total) by mouth daily.  . dorzolamide-timolol (COSOPT) 22.3-6.8 MG/ML ophthalmic solution Place 1 drop into both eyes 2 (two) times daily.   . furosemide (LASIX) 40 MG tablet Take 1 tablet (40 mg total) by mouth daily.  . hydrOXYzine (ATARAX/VISTARIL) 10 MG tablet Take 1 tablet (10 mg total) by mouth 3 (three) times daily as needed.  . isosorbide mononitrate (IMDUR) 30 MG  24 hr tablet Take 1 tablet (30 mg total) by mouth daily.  [name] latanoprost (XALATAN) 0.005 % ophthalmic solution Place 1 drop into both eyes at bedtime.  [name] lurasidone (LATUDA) 40 MG TABS tablet Take 40 mg by mouth daily with breakfast.  . metFORMIN (GLUCOPHAGE) 500 MG tablet Take 1 tablet (500 mg total) by mouth 2 (two) times daily with a meal.  . nitroGLYCERIN (NITROSTAT) 0.4 MG SL tablet DISSOLVE ONE TABLET UNDER THE TONGUE EVERY 5 MINUTES AS NEEDED FOR CHEST PAIN.  DO NOT EXCEED A TOTAL OF 3 DOSES IN 15 MINUTES  . spironolactone (ALDACTONE) 25 MG tablet Take half a tablet by mouth once a day  . traZODone (DESYREL) 100 MG tablet Take 100 mg by mouth at bedtime as needed for sleep.  . valsartan (DIOVAN) 80 MG tablet Take 1 tablet (80 mg total) by mouth 2 (two) times daily.  [name] venlafaxine XR (EFFEXOR-XR) 150 MG 24 hr capsule TAKE 2 CAPSULES BY MOUTH ONCE DAILY (NEEDS to follow-up with psychiatry for further refills)     Allergies:   Tetracycline, Meperidine, and Entresto [sacubitril-valsartan]   Social History   Tobacco Use  . Smoking status: Former Smoker    Types: Cigarettes    Quit date: 04/13/1997    Years since quitting: 23.9  . Smokeless tobacco: Never Used  Vaping Use  . Vaping Use: Never used  Substance Use Topics  . Alcohol use: Yes    Alcohol/week: 1.0 standard drink    Types: 1 Glasses of wine per week    Comment: 2-3 glasses of wine per month   . Drug use: No     Family Hx: The patient's family history includes Anxiety disorder in her maternal aunt and mother; Dementia in her mother; Depression in her mother; Diabetes in her mother; Drug abuse in her cousin; Heart disease in her mother; Heart failure in her mother; High Cholesterol in her father and mother; Hyperlipidemia in her mother; Hypertension in her father and mother; Mood Disorder in her sister; Paranoid behavior in her mother; Stroke in her mother and sister; Tuberculosis in her paternal grandfather.  ROS   EKGs/Labs/Other Test Reviewed:    EKG:  EKG is   ordered today.  The ekg ordered today demonstrates NSR, HR 79, left bundle branch block, no change prior tracing  Recent Labs: 04/18/2020: ALT 19 08/18/2020: TSH 1.63 09/14/2020: Hemoglobin 12.6; Platelets 344.0 01/19/2021: NT-Pro BNP 1,682 02/23/2021: BUN 14; Creatinine, Ser 1.08; Potassium 4.1; Sodium 137   Recent Lipid Panel Lab Results  Component Value Date/Time   CHOL 112 04/18/2020 08:15 AM   TRIG 65 04/18/2020 08:15 AM   HDL 39 (L) 04/18/2020 08:15 AM   CHOLHDL 2.9 04/18/2020 08:15 AM   CHOLHDL 3.7 03/31/2020 05:14 AM   LDLCALC 59 04/18/2020 08:15 AM   LDLDIRECT 78.0 02/07/2018 09:28 AM      Risk Assessment/Calculations:      Physical Exam:    VS:  BP (!) 124/58   Pulse 79   Ht _0  (1.6 m)   Wt 166 lb (75.3 kg)   SpO2 98%   BMI 29.41 kg/m     Wt Readings from Last 3 Encounters:  03/14/21 166 lb (75.3 kg)  03/06/21 165 lb 9.6 oz (75.1 kg)  02/23/21 167 lb 3.2 oz (75.8 kg)     Constitutional:      Appearance: Healthy appearance. Not in distress.  Neck:     Vascular: JVD normal.  Pulmonary:     Effort:  Pulmonary effort is normal.     Breath sounds: No wheezing. No rales.  Cardiovascular:     Normal rate. Regular rhythm. Normal S1. Normal S2.     Murmurs: There is no murmur.  Edema:    Peripheral edema absent.  Abdominal:     Palpations: Abdomen is soft. There is no  hepatomegaly.  Skin:    General: Skin is warm and dry.  Neurological:     Mental Status: Alert and oriented to person, place and time.     Cranial Nerves: Cranial nerves are intact.         ASSESSMENT & PLAN:    1. Preoperative cardiovascular examination She is requesting to undergo noninvasive cosmetic procedure.  This does not require any sedation or anesthesia and there is no significant risk for blood loss.  Although she has significant heart issues which places her at higher risk for CV complications, I think she can proceed with this cosmetic procedure at low risk.  She notes that she needs to hold clopidogrel for 2 days prior to the procedure.  She was placed on this after her stroke.  Strictly from a cardiac perspective, she can hold clopidogrel for 2 days and then resume post procedure when it is felt to be safe.  She does not require further testing.  2. HFrEF (heart failure with reduced ejection fraction) (HCC) Ischemic CM.  NYHA IIb-III.  EF < 20.    She has seen Dr. Haroldine Laws with the congestive heart failure clinic.  Cardiac MRI is pending.  She sees him again in June.  She notes she has been walking on a daily basis on her treadmill.  Her breathing has improved.  Volume status on exam seems stable.  Continue current dose of carvedilol, furosemide, isosorbide, valsartan, spironolactone.  She could not tolerate Entresto.  3. Coronary artery disease involving native coronary artery of native heart without angina pectoris Hx of anterior STEMI in 5/17 tx with DES x 2 to the LAD.Myoview 7/21 low risk.   She is not having anginal symptoms.  Continue atorvastatin, clopidogrel, carvedilol, atorvastatin.  Follow-up 6 months.  4. Essential hypertension The patient's blood pressure is controlled on her current regimen.  Continue current therapy.     Dispo:  Return in about 6 months (around 09/14/2021) for Routine Follow Up, w/ Dr. Burt Knack, or Richardson Dopp, PA-C.   Medication  Adjustments/Labs and Tests Ordered: Current medicines are reviewed at length with the patient today.  Concerns regarding medicines are outlined above.  Tests Ordered: Orders Placed This Encounter  Procedures  . EKG 12-Lead   Medication Changes: No orders of the defined types were placed in this encounter.   Signed, Richardson Dopp, PA-C  03/14/2021 4:06 PM    [facility] [address], [place], [place]  [address] Phone: [phone]; Fax: [phone]

## 2021-03-14 NOTE — Patient Instructions (Signed)
Medication Instructions:  NO CHANGES *If you need a refill on your cardiac medications before your next appointment, please call your pharmacy*   Lab Work: NONE If you have labs (blood work) drawn today and your tests are completely normal, you will receive your results only by: [name] MyChart Message (if you have MyChart) OR . A paper copy in the mail If you have any lab test that is abnormal or we need to change your treatment, we will call you to review the results.   Testing/Procedures: NONE   Follow-Up: At [facility], you and your health needs are our priority.  As part of our continuing mission to provide you with exceptional heart care, we have created designated Provider Care Teams.  These Care Teams include your primary Cardiologist (physician) and Advanced Practice Providers (APPs -  Physician Assistants and Nurse Practitioners) who all work together to provide you with the care you need, when you need it.  We recommend signing up for the patient portal called "MyChart".  Sign up information is provided on this After Visit Summary.  MyChart is used to connect with patients for Virtual Visits (Telemedicine).  Patients are able to view lab/test results, encounter notes, upcoming appointments, etc.  Non-urgent messages can be sent to your provider as well.   To learn more about what you can do with MyChart, go to [email or web address].    Your next appointment:   6 month(s)  The format for your next appointment:   In Person  Provider:   SCOTT WEAVER [facility] OR DR Burt Knack    Other Instructions

## 2021-03-15 DIAGNOSIS — Z961 Presence of intraocular lens: Secondary | ICD-10-CM | POA: Diagnosis not present

## 2021-03-15 DIAGNOSIS — H401133 Primary open-angle glaucoma, bilateral, severe stage: Secondary | ICD-10-CM | POA: Diagnosis not present

## 2021-03-15 LAB — HM DIABETES EYE EXAM

## 2021-03-27 ENCOUNTER — Telehealth: Payer: Self-pay | Admitting: Family Medicine

## 2021-03-27 NOTE — Telephone Encounter (Signed)
Patient would like to know if she can eat oranges and bananas every day.

## 2021-03-28 ENCOUNTER — Ambulatory Visit: Payer: Medicare Other | Admitting: Family Medicine

## 2021-03-28 ENCOUNTER — Other Ambulatory Visit: Payer: Self-pay | Admitting: Family Medicine

## 2021-03-28 DIAGNOSIS — I639 Cerebral infarction, unspecified: Secondary | ICD-10-CM

## 2021-03-28 NOTE — Telephone Encounter (Signed)
I called and informed the patient that the provider stated that there is no reason the patient could not eat bananas and oranges.  Tawni Melkonian,cma

## 2021-03-28 NOTE — Telephone Encounter (Signed)
I am not aware of any reason she could not eat oranges and bananas.

## 2021-04-06 ENCOUNTER — Other Ambulatory Visit (HOSPITAL_COMMUNITY): Payer: Medicare Other

## 2021-04-12 DIAGNOSIS — F251 Schizoaffective disorder, depressive type: Secondary | ICD-10-CM | POA: Diagnosis not present

## 2021-04-12 DIAGNOSIS — Z79899 Other long term (current) drug therapy: Secondary | ICD-10-CM | POA: Diagnosis not present

## 2021-04-18 ENCOUNTER — Ambulatory Visit: Payer: Medicare Other | Admitting: Family Medicine

## 2021-04-18 ENCOUNTER — Telehealth: Payer: Self-pay | Admitting: Family Medicine

## 2021-04-18 ENCOUNTER — Other Ambulatory Visit: Payer: Self-pay

## 2021-04-18 ENCOUNTER — Ambulatory Visit: Payer: Medicare Other | Admitting: *Deleted

## 2021-04-18 VITALS — BP 134/66 | HR 74 | Temp 97.8°F | Ht 63.0 in | Wt 169.0 lb

## 2021-04-18 DIAGNOSIS — Z0289 Encounter for other administrative examinations: Secondary | ICD-10-CM

## 2021-04-18 DIAGNOSIS — R42 Dizziness and giddiness: Secondary | ICD-10-CM

## 2021-04-18 NOTE — Telephone Encounter (Signed)
Opened in error

## 2021-04-18 NOTE — Progress Notes (Signed)
Patient came in after missing her appointment, she stated she is dizzy and needs to be seen ASAP. Spoken to patient, she stated she has a very constant dizziness. She has a hx of chronic dizziness. Patient stated her dizziness usually goes away after eating. This episode has not. She is currently having dizziness, SOB stumbling, nasal discharge, coughing, tired, and fatigued. No sx of fever, chills,nausea, vomiting, and diarrhea. Patient refused POC FLU, POC Strep, and POC COVID. Ortho statics have been completed.  Laying BP-120/60 BPM-78 SPO2-92% Sitting BP-108/60 BPM-88 SPO2-94% Standing BP-114/62 BPM-82 SPO2-97%

## 2021-04-18 NOTE — Progress Notes (Signed)
Patient was reported to me as having COVID like symptoms by CMA. The CMA not fit tested so Melanie dressed in appropriate  PPE and went into triage patient Melanie advised her we would like to test for COVID , Flu, and possible Strepp . Patient stated " Melanie don't have no FLU or Stepp and Melanie sure don't have covid, Patient coughing , stated Melanie just want an appointment for ENT referral with My doctor. Melanie advised that patient should be seen at urgent care for her symptoms if she would not let me test she stated no, she wanted an appointment with her provider. Melanie advised the appointment would have to bevirtual due to provider cannot wear the appropriate PPE and wit her symptoms we are required to wear PPE , patient stated 'THIS IS STUPID" "Melanie Ray" " Melanie Ray" scheduled appointment for 04/21/21 at 8 Virtual . Escorted patient to front of building appropriately and advised PCP of outcome.

## 2021-04-21 ENCOUNTER — Encounter: Payer: Self-pay | Admitting: Family Medicine

## 2021-04-21 ENCOUNTER — Ambulatory Visit
Admission: RE | Admit: 2021-04-21 | Discharge: 2021-04-21 | Disposition: A | Discharge status: Home / Self Care | Payer: Medicare Other | Source: Ambulatory Visit | Attending: Family Medicine | Admitting: Family Medicine

## 2021-04-21 ENCOUNTER — Other Ambulatory Visit
Admission: RE | Admit: 2021-04-21 | Discharge: 2021-04-21 | Disposition: A | Discharge status: Home / Self Care | Payer: Medicare Other | Source: Ambulatory Visit | Attending: Family Medicine | Admitting: Family Medicine

## 2021-04-21 ENCOUNTER — Other Ambulatory Visit: Payer: Self-pay

## 2021-04-21 ENCOUNTER — Telehealth (INDEPENDENT_AMBULATORY_CARE_PROVIDER_SITE_OTHER): Payer: Medicare Other | Admitting: Family Medicine

## 2021-04-21 ENCOUNTER — Telehealth: Payer: Self-pay | Admitting: Cardiovascular Disease

## 2021-04-21 VITALS — Ht 63.0 in | Wt 166.0 lb

## 2021-04-21 DIAGNOSIS — R0600 Dyspnea, unspecified: Secondary | ICD-10-CM

## 2021-04-21 DIAGNOSIS — R0609 Other forms of dyspnea: Secondary | ICD-10-CM

## 2021-04-21 DIAGNOSIS — R748 Abnormal levels of other serum enzymes: Secondary | ICD-10-CM | POA: Diagnosis not present

## 2021-04-21 DIAGNOSIS — R42 Dizziness and giddiness: Secondary | ICD-10-CM

## 2021-04-21 DIAGNOSIS — R5383 Other fatigue: Secondary | ICD-10-CM | POA: Insufficient documentation

## 2021-04-21 DIAGNOSIS — R531 Weakness: Secondary | ICD-10-CM | POA: Insufficient documentation

## 2021-04-21 DIAGNOSIS — R0602 Shortness of breath: Secondary | ICD-10-CM | POA: Diagnosis not present

## 2021-04-21 LAB — CBC WITH DIFFERENTIAL/PLATELET
Abs Immature Granulocytes: 0.01 10*3/uL (ref 0.00–0.07)
Basophils Absolute: 0 10*3/uL (ref 0.0–0.1)
Basophils Relative: 1 %
Eosinophils Absolute: 0.1 10*3/uL (ref 0.0–0.5)
Eosinophils Relative: 1 %
HCT: 34.3 % — ABNORMAL LOW (ref 36.0–46.0)
Hemoglobin: 11.3 g/dL — ABNORMAL LOW (ref 12.0–15.0)
Immature Granulocytes: 0 %
Lymphocytes Relative: 29 %
Lymphs Abs: 1.9 10*3/uL (ref 0.7–4.0)
MCH: 27.4 pg (ref 26.0–34.0)
MCHC: 32.9 g/dL (ref 30.0–36.0)
MCV: 83.3 fL (ref 80.0–100.0)
Monocytes Absolute: 0.9 10*3/uL (ref 0.1–1.0)
Monocytes Relative: 14 %
Neutro Abs: 3.6 10*3/uL (ref 1.7–7.7)
Neutrophils Relative %: 55 %
Platelets: 303 10*3/uL (ref 150–400)
RBC: 4.12 MIL/uL (ref 3.87–5.11)
RDW: 14.1 % (ref 11.5–15.5)
WBC: 6.5 10*3/uL (ref 4.0–10.5)
nRBC: 0 % (ref 0.0–0.2)

## 2021-04-21 LAB — COMPREHENSIVE METABOLIC PANEL
ALT: 16 U/L (ref 0–44)
AST: 17 U/L (ref 15–41)
Albumin: 4.1 g/dL (ref 3.5–5.0)
Alkaline Phosphatase: 87 U/L (ref 38–126)
Anion gap: 5 (ref 5–15)
BUN: 21 mg/dL (ref 8–23)
CO2: 25 mmol/L (ref 22–32)
Calcium: 9.8 mg/dL (ref 8.9–10.3)
Chloride: 107 mmol/L (ref 98–111)
Creatinine, Ser: 1.13 mg/dL — ABNORMAL HIGH (ref 0.44–1.00)
GFR, Estimated: 52 mL/min — ABNORMAL LOW (ref >60)
Glucose, Bld: 74 mg/dL (ref 70–99)
Potassium: 4.5 mmol/L (ref 3.5–5.1)
Sodium: 137 mmol/L (ref 135–145)
Total Bilirubin: 0.7 mg/dL (ref 0.3–1.2)
Total Protein: 7.3 g/dL (ref 6.5–8.1)

## 2021-04-21 LAB — BRAIN NATRIURETIC PEPTIDE: B Natriuretic Peptide: 345.6 pg/mL — ABNORMAL HIGH (ref 0.0–100.0)

## 2021-04-21 LAB — TSH: TSH: 1.291 u[IU]/mL (ref 0.350–4.500)

## 2021-04-21 IMAGING — CR DG CHEST 2V
2 series · 2 of 2 positions shown · non-contrast
Comparison: [DATE]

CLINICAL DATA: Progressive shortness of breath

EXAM:
CHEST - 2 VIEW

[chest pa]
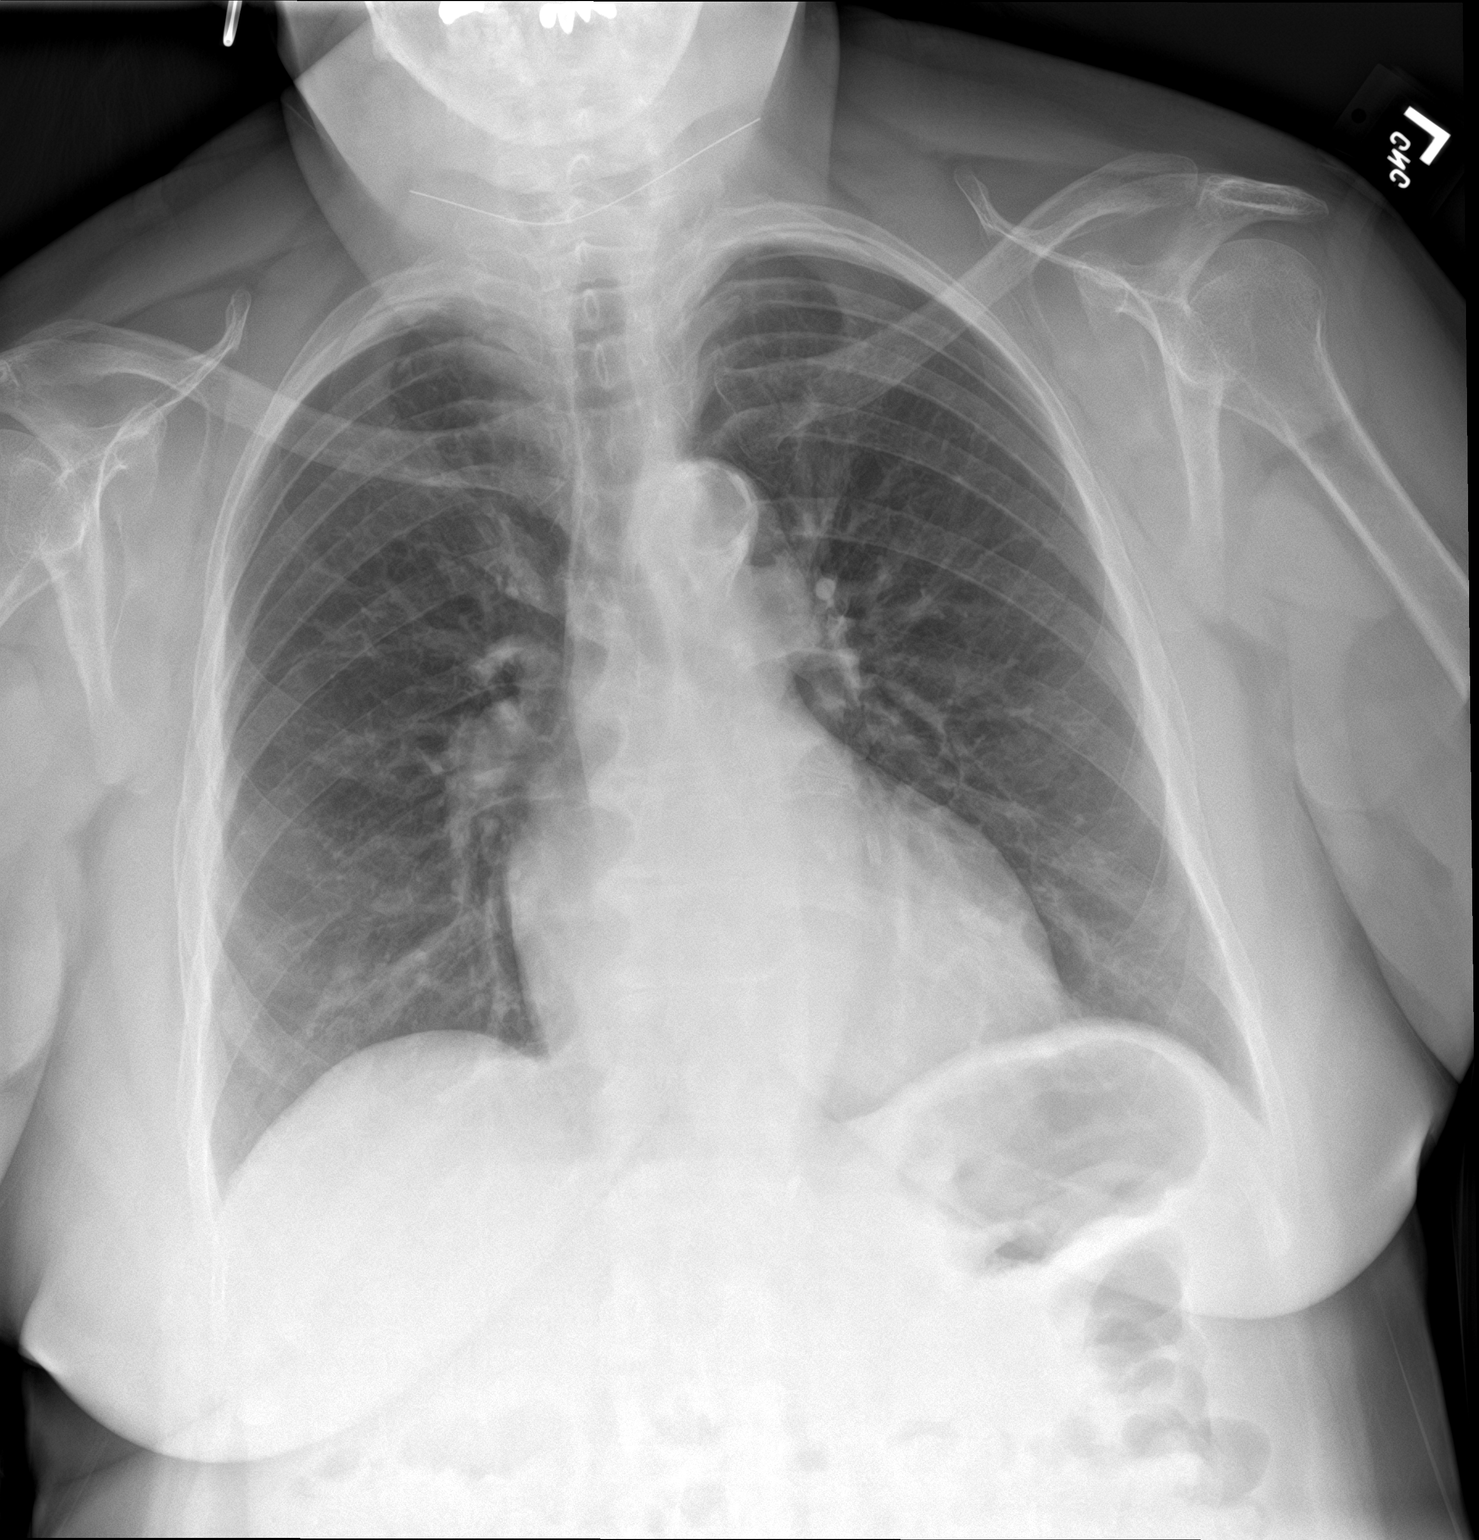

[chest lat]
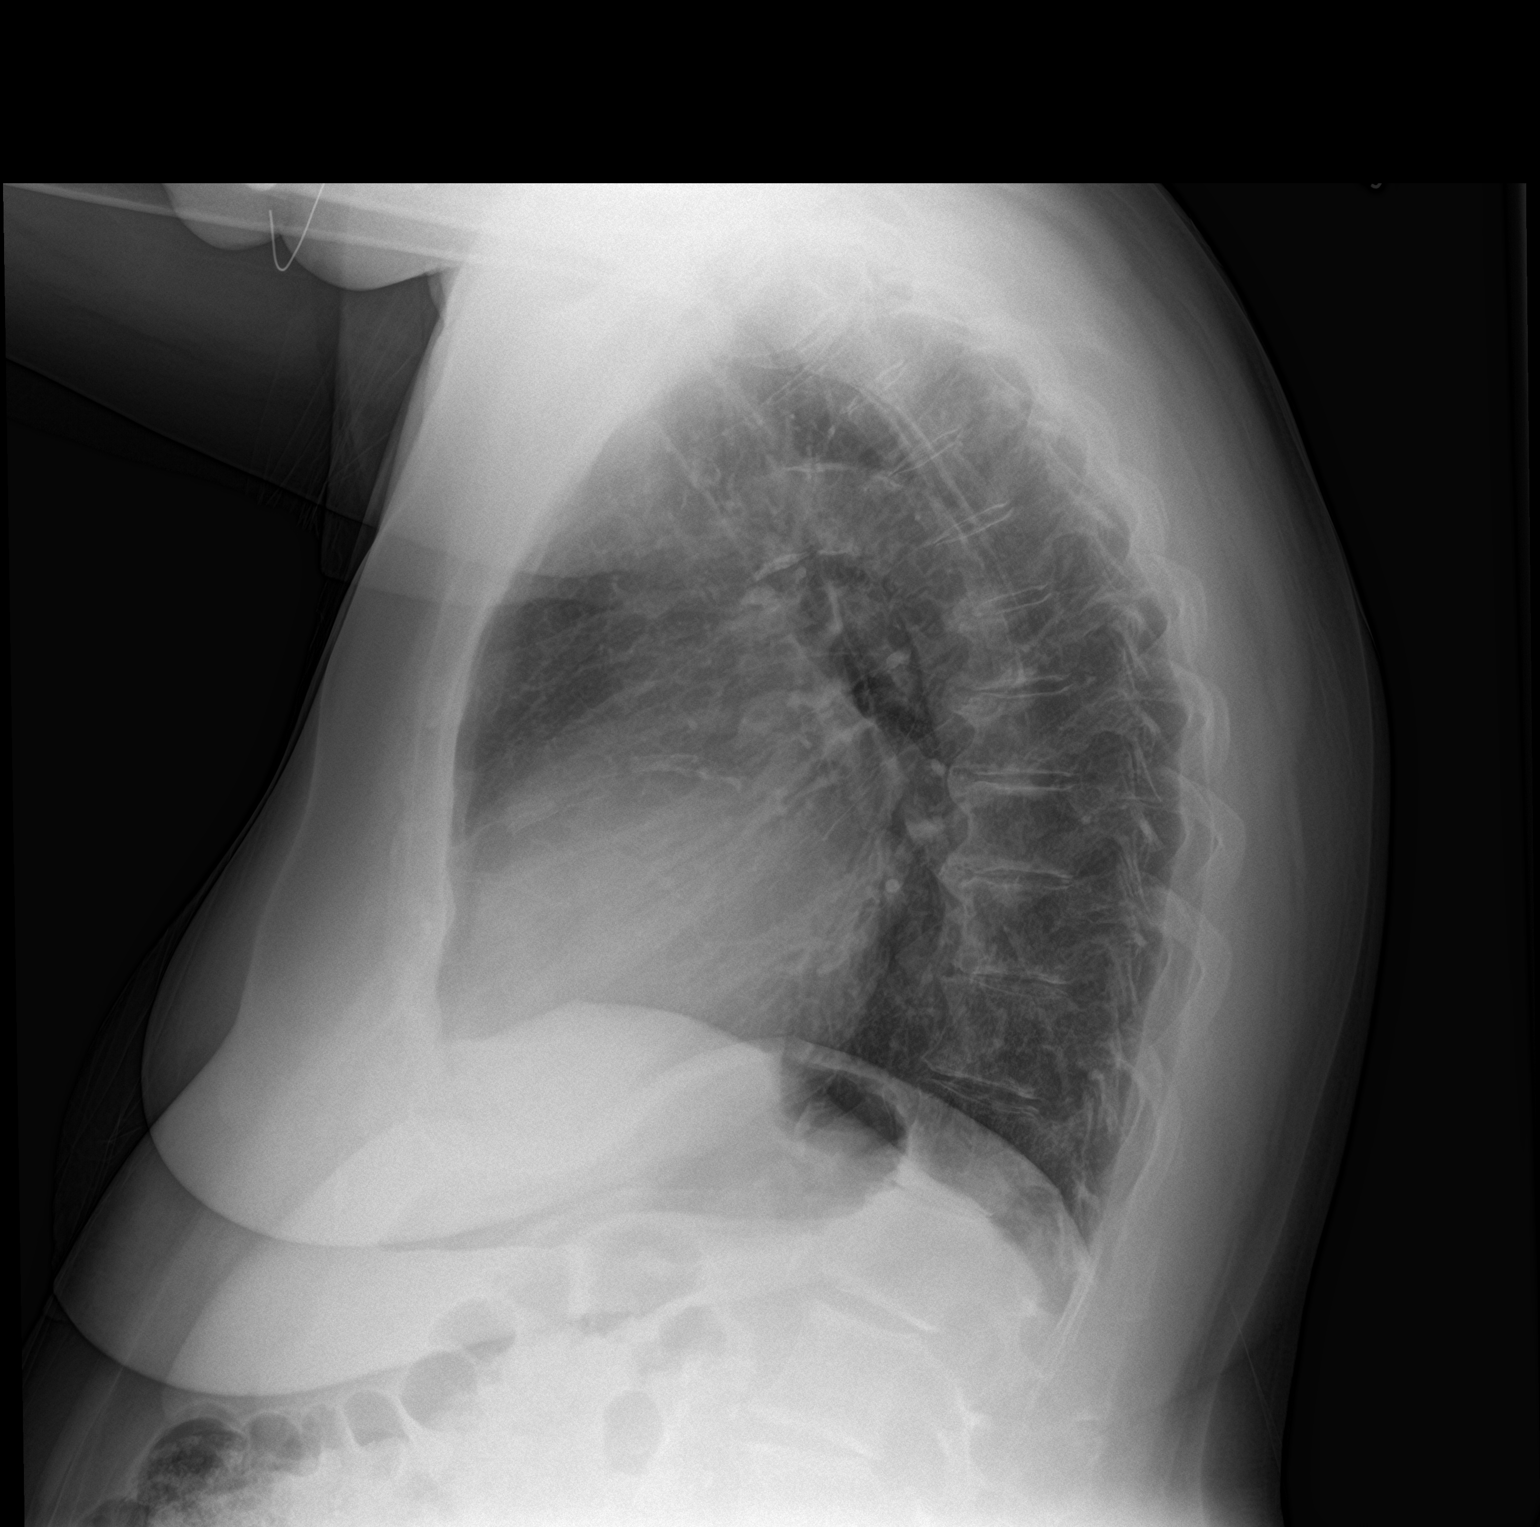

[2 of 2 positions shown; findings below may reference images not displayed]

FINDINGS: The heart size and mediastinal contours are within normal limits.
Both lungs are clear. The visualized skeletal structures are
unremarkable.
IMPRESSION: No active cardiopulmonary disease.

## 2021-04-21 NOTE — Telephone Encounter (Signed)
The patient reports increased dizziness since Scott's visit in May. She also has SOB does not feel she is in acute distress. She is not audibly SOB on the phone and says overall she feels OK excepting the dizziness. She had a video visit with her PCP this morning. She is going to have a CXR and labs drawn today and Dr. Caryl Bis instructed her to call Cardiology for follow-up. Confirmed with her she has an appointment with Dr. Haroldine Laws this Monday, 6/20. Encouraged her to keep appointment and instructed her not to drive if she is dizzy. She was grateful for call and agrees with plan. ER precautions reviewed.

## 2021-04-21 NOTE — Telephone Encounter (Signed)
STAT if patient feels like he/she is going to faint   Are you dizzy now? Yes  Do you feel faint or have you passed out? She feels faint and has not yet passed out   Do you have any other symptoms? SOB  Have you checked your HR and BP (record if available)? No    Pt c/o Shortness Of Breath: STAT if SOB developed within the last 24 hours or pt is noticeably SOB on the phone  1. Are you currently SOB (can you hear that pt is SOB on the phone)? Yes  2. How long have you been experiencing SOB? It has been going on for a few weeks  3. Are you SOB when sitting or when up moving around? Both   4. Are you currently experiencing any other symptoms? No

## 2021-04-21 NOTE — Assessment & Plan Note (Addendum)
Chronic issue though somewhat worsened over the last month.  Discussed that this could be related to her chronic cardiac issues though could also be related to a number of other issues.  Will obtain lab work to evaluate further.  She will have a chest x-ray.  I did encourage her to contact her cardiologist for follow-up evaluation of this. I did advise to seek medical attention for worsening breathing issues, chest pain, or persistent shortness of breath.

## 2021-04-21 NOTE — Assessment & Plan Note (Addendum)
Symptoms seem to be more consistent with lightheadedness.  This has been going on for a month or so.  The differential is large.  This could be related to her chronic heart issues or low blood pressures.  She could be anemic.  She could be dehydrated.  Discussed getting lab work and [place] [facility] [place] [facility] [place] [facility] [place].  Discussed the potential for altering her blood pressure medication regimen.  She was also encouraged to contact her cardiologist for follow-up.

## 2021-04-21 NOTE — Assessment & Plan Note (Signed)
Labs to evaluate for cause.  She did discuss her symptoms with her psychiatrist already and they recommended seeing a different specialist for them.

## 2021-04-21 NOTE — Progress Notes (Signed)
Virtual Visit via telephone Note  This visit type was conducted due to national recommendations for restrictions regarding the COVID-19 pandemic (e.g. social distancing).  This format is felt to be most appropriate for this patient at this time.  All issues noted in this document were discussed and addressed.  No physical exam was performed (except for noted visual exam findings with Video Visits).   I connected with Melanie Ray today at  8:00 AM EDT by telephone and verified that I am speaking with the correct person using two identifiers. Location patient: home Location provider: work Persons participating in the virtual visit: patient, provider  I discussed the limitations, risks, security and privacy concerns of performing an evaluation and management service by telephone and the availability of in person appointments. I also discussed with the patient that there may be a patient responsible charge related to this service. The patient expressed understanding and agreed to proceed.  Interactive audio and video telecommunications were attempted between this provider and patient, however failed, due to patient having technical difficulties OR patient did not have access to video capability.  We continued and completed visit with audio only.   Reason for visit: same day visit  HPI: Lightheadedness: Patient notes this has been going on at least a month.  She at times has felt as though she was going to pass out though she would sit down and that sensation would resolve.  There are no vertiginous symptoms.  Mild ear fullness at times.  No tinnitus.  She has had shortness of breath that seems to have worsened a little bit over the last month.  She notes no chest pain.  No palpitations.  She has been walking daily previously for 11 minutes though recently had to cut that down because of the lightheadedness.  She was triaged in the office a couple days ago and had negative orthostatics.  She  notes she does not check her blood pressure at home.  No orthopnea or PND.  She notes occasional edema in her legs.  She had a high risk stress test last year related to a significantly reduced EF.  She additionally reports feeling tired like she just does not want to do anything most of the time.  She notes that has been going on the same amount of time as her dizziness.  She notes no exertional fatigue.  She has had a mild cough and runny nose that have been going on since she had COVID in January.  She saw her psychiatrist last week and they thought maybe she should see a specialist for a potential inner ear issue.   ROS: See pertinent positives and negatives per HPI.  Past Medical History:  Diagnosis Date   Anemia    Anxiety    Back pain    CAD (coronary artery disease) 04/11/2016   S/p ant STEMI 5/17: LHC >> LAD proximal 80%, mid 80%, distal 50%, ostial D1 60%; LCx with LPDA lesion 30%; RCA Mild calcification with no significant stenosis in a medium caliber, nondominant RCA; LVEF is estimated at 45% with inferoapical and lateral wall akinesis >> PCI: PCI: 3.5 x 24 mm Promus DES to prox LAD, 2.5 x 12 mm Promus DES to mid LAD. // Myoview 7/21: EF 20, no ischemia; high risk    Chest pain    Chronic systolic CHF (congestive heart failure) ([place]) 03/21/2016   Echo 01/30/17: Diff HK, mild focal basal septal hypertrophy, EF 30-35, mild AI, MAC, mild MR // Echo  06/08/16: Mild focal basal septal hypertrophy, EF 25-30%, diff HK, ant-septal AK, Gr 1 DD, mild AI, MAC, mild MR, PASP 37 mmHg // Echo 03/18/16: EF 30-35%, ant-septal AK, Gr 1 DD, mild MR, severe LAE.   Constipation    Depression    Diabetes mellitus type 2 in obese (HCC)    Dizziness    Glaucoma    Gout    Heart disease    Heartburn    History of acute anterior wall MI 03/17/2016   History of heart attack    Hyperlipemia    Hypertension    Ischemic cardiomyopathy 10/02/2016   Refused ICD   Joint pain    Left bundle branch block     Myocardial infarction ([place])    Nausea    Sleep apnea    SOB (shortness of breath)    Suicidal ideation 08/27/2018   Swelling    feet or legs    Past Surgical History:  Procedure Laterality Date   APPENDECTOMY     CARDIAC CATHETERIZATION N/A 03/17/2016   Procedure: Left Heart Cath and Coronary Angiography;  Surgeon: Sherren Mocha, MD;  Location: [facility];  Service: Cardiovascular;  Laterality: N/A;   CARDIAC CATHETERIZATION N/A 03/17/2016   Procedure: Coronary Stent Intervention;  Surgeon: Sherren Mocha, MD;  Location: [facility];  Service: Cardiovascular;  Laterality: N/A;   COLONOSCOPY WITH PROPOFOL N/A 08/13/2017   Procedure: COLONOSCOPY WITH PROPOFOL;  Surgeon: Jonathon Bellows, MD;  Location: [facility] ENDOSCOPY;  Service: Gastroenterology;  Laterality: N/A;   COLONOSCOPY WITH PROPOFOL N/A 03/29/2020   Procedure: COLONOSCOPY WITH PROPOFOL;  Surgeon: Lucilla Lame, MD;  Location: [facility] ENDOSCOPY;  Service: Endoscopy;  Laterality: N/A;   SMALL BOWEL REPAIR     TONSILLECTOMY     UTERINE FIBROID SURGERY      Family History  Problem Relation Age of Onset   Anxiety disorder Mother    Paranoid behavior Mother    Hypertension Mother    Heart failure Mother    Stroke Mother    Dementia Mother    High Cholesterol Mother    Diabetes Mother    Hyperlipidemia Mother    Heart disease Mother    Depression Mother    Hypertension Father    High Cholesterol Father    Mood Disorder Sister    Stroke Sister    Anxiety disorder Maternal Aunt    Drug abuse Cousin    Tuberculosis Paternal Grandfather     SOCIAL HX: former smoker   Current Outpatient Medications:    albuterol (VENTOLIN HFA) 108 (90 Base) MCG/ACT inhaler, Inhale 2 puffs into the lungs every 6 (six) hours as needed for shortness of breath. , Disp: , Rfl:    atorvastatin (LIPITOR) 80 MG tablet, Take 1 tablet (80 mg total) by mouth daily., Disp: 90 tablet, Rfl: 3   blood glucose meter kit and supplies KIT, Dispense  based on patient and insurance preference.  Check glucose once daily fasting in the morning. (FOR ICD 10 E11.9)., Disp: 1 each, Rfl: 0   carvedilol (COREG) 12.5 MG tablet, Take 1 tablet (12.5 mg total) by mouth 2 (two) times daily with a meal., Disp: 180 tablet, Rfl: 3   clopidogrel (PLAVIX) 75 MG tablet, Take 1 tablet by mouth once daily, Disp: 90 tablet, Rfl: 0   dorzolamide-timolol (COSOPT) 22.3-6.8 MG/ML ophthalmic solution, Place 1 drop into both eyes 2 (two) times daily. , Disp: , Rfl:    furosemide (LASIX) 40 MG tablet, Take 1 tablet (  40 mg total) by mouth daily., Disp: 90 tablet, Rfl: 1   hydrOXYzine (ATARAX/VISTARIL) 10 MG tablet, Take 1 tablet (10 mg total) by mouth 3 (three) times daily as needed., Disp: 30 tablet, Rfl: 0   isosorbide mononitrate (IMDUR) 30 MG 24 hr tablet, Take 1 tablet (30 mg total) by mouth daily., Disp: 90 tablet, Rfl: 3   latanoprost (XALATAN) 0.005 % ophthalmic solution, Place 1 drop into both eyes at bedtime., Disp: , Rfl:    lurasidone (LATUDA) 40 MG TABS tablet, Take 40 mg by mouth daily with breakfast., Disp: , Rfl:    metFORMIN (GLUCOPHAGE) 500 MG tablet, Take 1 tablet (500 mg total) by mouth 2 (two) times daily with a meal., Disp: 180 tablet, Rfl: 1   nitroGLYCERIN (NITROSTAT) 0.4 MG SL tablet, DISSOLVE ONE TABLET UNDER THE TONGUE EVERY 5 MINUTES AS NEEDED FOR CHEST PAIN.  DO NOT EXCEED A TOTAL OF 3 DOSES IN 15 MINUTES, Disp: 25 tablet, Rfl: 10   spironolactone (ALDACTONE) 25 MG tablet, Take half a tablet by mouth once a day, Disp: 45 tablet, Rfl: 3   traZODone (DESYREL) 100 MG tablet, Take 100 mg by mouth at bedtime as needed for sleep., Disp: , Rfl:    valsartan (DIOVAN) 80 MG tablet, Take 1 tablet (80 mg total) by mouth 2 (two) times daily., Disp: 180 tablet, Rfl: 3   venlafaxine XR (EFFEXOR-XR) 150 MG 24 hr capsule, TAKE 2 CAPSULES BY MOUTH ONCE DAILY (NEEDS to follow-up with psychiatry for further refills), Disp: 60 capsule, Rfl: 0  EXAM: This was a  telephone visit and thus no physical exam was completed.  ASSESSMENT AND PLAN:  Discussed the following assessment and plan:  Problem List Items Addressed This Visit     Dizziness    Symptoms seem to be more consistent with lightheadedness.  This has been going on for a month or so.  The differential is large.  This could be related to her chronic heart issues or low blood pressures.  She could be anemic.  She could be dehydrated.  Discussed getting lab work and [place] [facility] [place].  Discussed the potential for altering her blood pressure medication regimen.  She was also encouraged to contact her cardiologist for follow-up.        Dyspnea    Chronic issue though somewhat worsened over the last month.  Discussed that this could be related to her chronic cardiac issues though could also be related to a number of other issues.  Will obtain lab work to evaluate further.  She will have a chest x-ray.  I did encourage her to contact her cardiologist for follow-up evaluation of this. I did advise to seek medical attention for worsening breathing issues, chest pain, or persistent shortness of breath.       Relevant Orders   B Nat Peptide   DG Chest 2 View   Fatigue    Labs to evaluate for cause.  She did discuss her symptoms with her psychiatrist already and they recommended seeing a different specialist for them.       Relevant Orders   CBC w/Diff   Comp Met (CMET)   TSH   Other Visit Diagnoses     Light headed    -  Primary   Relevant Orders   CBC w/Diff   B Nat Peptide   Comp Met (CMET)   Elevated vitamin B12 level  No follow-ups on file.   I discussed the assessment and treatment plan with the patient. The patient was provided an opportunity to ask questions and all were answered. The patient agreed with the plan and demonstrated an understanding of the instructions.   The patient was advised to call back or seek an in-person  evaluation if the symptoms worsen or if the condition fails to improve as anticipated.  I provided 16 minutes of non-face-to-face time during this encounter.   Tommi Rumps, MD

## 2021-04-24 ENCOUNTER — Other Ambulatory Visit: Payer: Self-pay

## 2021-04-24 ENCOUNTER — Encounter (HOSPITAL_COMMUNITY): Payer: Self-pay | Admitting: Internal Medicine

## 2021-04-24 ENCOUNTER — Ambulatory Visit (HOSPITAL_COMMUNITY)
Admission: RE | Admit: 2021-04-24 | Discharge: 2021-04-24 | Disposition: A | Discharge status: Home / Self Care | Payer: Medicare Other | Source: Ambulatory Visit | Attending: Internal Medicine | Admitting: Internal Medicine

## 2021-04-24 VITALS — BP 134/67 | HR 74 | Wt 170.2 lb

## 2021-04-24 DIAGNOSIS — K760 Fatty (change of) liver, not elsewhere classified: Secondary | ICD-10-CM | POA: Diagnosis not present

## 2021-04-24 DIAGNOSIS — I10 Essential (primary) hypertension: Secondary | ICD-10-CM | POA: Diagnosis not present

## 2021-04-24 DIAGNOSIS — Z8249 Family history of ischemic heart disease and other diseases of the circulatory system: Secondary | ICD-10-CM | POA: Insufficient documentation

## 2021-04-24 DIAGNOSIS — I2510 Atherosclerotic heart disease of native coronary artery without angina pectoris: Secondary | ICD-10-CM

## 2021-04-24 DIAGNOSIS — Z87891 Personal history of nicotine dependence: Secondary | ICD-10-CM | POA: Diagnosis not present

## 2021-04-24 DIAGNOSIS — Z7984 Long term (current) use of oral hypoglycemic drugs: Secondary | ICD-10-CM | POA: Insufficient documentation

## 2021-04-24 DIAGNOSIS — E785 Hyperlipidemia, unspecified: Secondary | ICD-10-CM | POA: Insufficient documentation

## 2021-04-24 DIAGNOSIS — Z8673 Personal history of transient ischemic attack (TIA), and cerebral infarction without residual deficits: Secondary | ICD-10-CM | POA: Insufficient documentation

## 2021-04-24 DIAGNOSIS — I252 Old myocardial infarction: Secondary | ICD-10-CM | POA: Diagnosis not present

## 2021-04-24 DIAGNOSIS — E119 Type 2 diabetes mellitus without complications: Secondary | ICD-10-CM | POA: Diagnosis not present

## 2021-04-24 DIAGNOSIS — I447 Left bundle-branch block, unspecified: Secondary | ICD-10-CM

## 2021-04-24 DIAGNOSIS — R42 Dizziness and giddiness: Secondary | ICD-10-CM | POA: Diagnosis not present

## 2021-04-24 DIAGNOSIS — E669 Obesity, unspecified: Secondary | ICD-10-CM | POA: Diagnosis not present

## 2021-04-24 DIAGNOSIS — I110 Hypertensive heart disease with heart failure: Secondary | ICD-10-CM | POA: Diagnosis not present

## 2021-04-24 DIAGNOSIS — Z79899 Other long term (current) drug therapy: Secondary | ICD-10-CM | POA: Insufficient documentation

## 2021-04-24 DIAGNOSIS — Z7902 Long term (current) use of antithrombotics/antiplatelets: Secondary | ICD-10-CM | POA: Diagnosis not present

## 2021-04-24 DIAGNOSIS — I5022 Chronic systolic (congestive) heart failure: Secondary | ICD-10-CM

## 2021-04-24 DIAGNOSIS — I255 Ischemic cardiomyopathy: Secondary | ICD-10-CM | POA: Diagnosis not present

## 2021-04-24 DIAGNOSIS — Z955 Presence of coronary angioplasty implant and graft: Secondary | ICD-10-CM | POA: Diagnosis not present

## 2021-04-24 NOTE — Progress Notes (Unsigned)
   ReDS Vest / Clip - 04/24/21 1300       ReDS Vest / Clip   Station Marker A    Ruler Value 27    ReDS Actual Value 38

## 2021-04-24 NOTE — Progress Notes (Signed)
ADVANCED HF CLINIC NOTE  Referring Physician: Dr. Burt Ray Primary Care: Melanie Haven, MD Primary Cardiologist: Melanie Mocha, MD   HPI:  [name] is a 70 y.o. female with the following medical issues. She was referred by Dr. Burt Ray for further assessment and management of her HF.  Coronary artery disease  S/p Anterior STEMI 5/17 >> PCI: DES x 2 to LAD Myoview 7/21: EF 20, no ischemia Heart failure with reduced ejection fraction   Ischemic CM EF 30-35 at time of MI 5/17 >> DC on [facility] >> Pt DC'd at FU visit Echocardiogram 8/17: EF 25-30 Echocardiogram 3/18: EF 30-35 Echocardiogram 8/19: EF 30-35 EP eval (Melanie Ray) x 2 >> pt declined CRT-D Intol of Entresto Echocardiogram 03/2020: EF < 20% Myoview 7/21 EF 20% no ischemia Obsessive Compulsive d/o Diabetes mellitus 2 Hypertension  Hyperlipidemia  LBBB OSA Hepatic steatosis  Hx of cerebellar CVA 03/2020 Bilateral vertebral artery stenosis   We saw her for the first time in 3/22. Referred for cMRI to assess degree of LAD scar. MRI not completed    Says she hasn't been feeling well the past 2 weeks has been having frequent dizziness and SOB. Says she feels like she is spinning. No nausea, She thinks her PCP cut one of her medications in half but doesn't know which one. Occasional edema. No orthopnea or PND. No CP. Taking lasix 88m daily. Weight unchanged. Exercising 3x/week on TM for 10 mins at 0.569m.  No HAs or other focal Neuro symptoms.     Past Medical History:  Diagnosis Date   Anemia    Anxiety    Back pain    CAD (coronary artery disease) 04/11/2016   S/p ant STEMI 5/17: LHC >> LAD proximal 80%, mid 80%, distal 50%, ostial D1 60%; LCx with LPDA lesion 30%; RCA Mild calcification with no significant stenosis in a medium caliber, nondominant RCA; LVEF is estimated at 45% with inferoapical and lateral wall akinesis >> PCI: PCI: 3.5 x 24 mm Promus DES to prox LAD, 2.5 x 12 mm Promus DES to mid  LAD. // Myoview 7/21: EF 20, no ischemia; high risk    Chest pain    Chronic systolic CHF (congestive heart failure) [place]   Echo 01/30/17: Diff HK, mild focal basal septal hypertrophy, EF 30-35, mild AI, MAC, mild MR // Echo 06/08/16: Mild focal basal septal hypertrophy, EF 25-30%, diff HK, ant-septal AK, Gr 1 DD, mild AI, MAC, mild MR, PASP 37 mmHg // Echo 03/18/16: EF 30-35%, ant-septal AK, Gr 1 DD, mild MR, severe LAE.   Constipation    Depression    Diabetes mellitus type 2 in obese (HCC)    Dizziness    Glaucoma    Gout    Heart disease    Heartburn    History of acute anterior wall MI 03/17/2016   History of heart attack    Hyperlipemia    Hypertension    Ischemic cardiomyopathy 10/02/2016   Refused ICD   Joint pain    Left bundle branch block    Myocardial infarction (HCC)    Nausea    Positive colorectal cancer screening using Cologuard test 03/01/2020   Sleep apnea    SOB (shortness of breath)    Suicidal ideation 08/27/2018   Swelling    feet or legs    Current Outpatient Medications  Medication Sig Dispense Refill   albuterol (VENTOLIN HFA) 108 (90 Base) MCG/ACT inhaler Inhale 2 puffs into the lungs every  6 (six) hours as needed for shortness of breath.      atorvastatin (LIPITOR) 80 MG tablet Take 1 tablet (80 mg total) by mouth daily. 90 tablet 3   blood glucose meter kit and supplies KIT Dispense based on patient and insurance preference.  Check glucose once daily fasting in the morning. (FOR ICD 10 E11.9). 1 each 0   carvedilol (COREG) 12.5 MG tablet Take 1 tablet (12.5 mg total) by mouth 2 (two) times daily with a meal. 180 tablet 3   clopidogrel (PLAVIX) 75 MG tablet Take 1 tablet by mouth once daily 90 tablet 0   dorzolamide-timolol (COSOPT) 22.3-6.8 MG/ML ophthalmic solution Place 1 drop into both eyes 2 (two) times daily.      furosemide (LASIX) 40 MG tablet Take 1 tablet (40 mg total) by mouth daily. 90 tablet 1   isosorbide mononitrate (IMDUR) 30 MG  24 hr tablet Take 1 tablet (30 mg total) by mouth daily. 90 tablet 3   latanoprost (XALATAN) 0.005 % ophthalmic solution Place 1 drop into both eyes at bedtime.     lurasidone (LATUDA) 40 MG TABS tablet Take 40 mg by mouth daily with breakfast.     metFORMIN (GLUCOPHAGE) 500 MG tablet Take 1 tablet (500 mg total) by mouth 2 (two) times daily with a meal. 180 tablet 1   nitroGLYCERIN (NITROSTAT) 0.4 MG SL tablet DISSOLVE ONE TABLET UNDER THE TONGUE EVERY 5 MINUTES AS NEEDED FOR CHEST PAIN.  DO NOT EXCEED A TOTAL OF 3 DOSES IN 15 MINUTES 25 tablet 10   spironolactone (ALDACTONE) 25 MG tablet Take half a tablet by mouth once a day 45 tablet 3   traZODone (DESYREL) 100 MG tablet Take 100 mg by mouth at bedtime as needed for sleep.     valsartan (DIOVAN) 80 MG tablet Take 1 tablet (80 mg total) by mouth 2 (two) times daily. 180 tablet 3   venlafaxine XR (EFFEXOR-XR) 150 MG 24 hr capsule TAKE 2 CAPSULES BY MOUTH ONCE DAILY (NEEDS to follow-up with psychiatry for further refills) 60 capsule 0   No current facility-administered medications for this encounter.    Allergies  Allergen Reactions   Tetracycline Swelling   Meperidine Nausea And Vomiting    Nausea   Entresto [Sacubitril-Valsartan] Other (See Comments)    Shortness of Breath      Social History   Socioeconomic History   Marital status: Married    Spouse name: Melanie Ray   Number of children: 0   Years of education: BS in education   Highest education level: Not on file  Occupational History   Occupation:  Retired Education officer, museum  Tobacco Use   Smoking status: Former    Pack years: 0.00    Types: Cigarettes    Quit date: 04/13/1997    Years since quitting: 24.0   Smokeless tobacco: Never  Vaping Use   Vaping Use: Never used  Substance and Sexual Activity   Alcohol use: Yes    Alcohol/week: 1.0 standard drink    Types: 1 Glasses of wine per week    Comment: 2-3 glasses of wine per month   Drug use: No   Sexual  activity: Not Currently  Other Topics Concern   Not on file  Social History Narrative   Lives in [place] with spouse.  No children.   Retired first [employer] for over 30 years ([place] for 10 years and then in [place] for over 20 years).   Left-handed   Social  Determinants of Health   Financial Resource Strain: Low Risk    Difficulty of Paying Living Expenses: Not hard at all  Food Insecurity: No Food Insecurity   Worried About [place] in the Last Year: Never true   Ran Out of Food in the Last Year: Never true  Transportation Needs: No Transportation Needs   Lack of Transportation (Medical): No   Lack of Transportation (Non-Medical): No  Physical Activity: Not on file  Stress: No Stress Concern Present   Feeling of Stress : Only a little  Social Connections: Not on file  Intimate Partner Violence: Not on file      Family History  Problem Relation Age of Onset   Anxiety disorder Mother    Paranoid behavior Mother    Hypertension Mother    Heart failure Mother    Stroke Mother    Dementia Mother    High Cholesterol Mother    Diabetes Mother    Hyperlipidemia Mother    Heart disease Mother    Depression Mother    Hypertension Father    High Cholesterol Father    Mood Disorder Sister    Stroke Sister    Anxiety disorder Maternal Aunt    Drug abuse Cousin    Tuberculosis Paternal Grandfather     Vitals:   04/24/21 1117  BP: 134/67  Pulse: 74  SpO2: 98%  Weight: 77.2 kg (170 lb 3.2 oz)   Orthostatics done personally  Sitting 156/74 Standing 164/82   PHYSICAL EXAM: General:  Well appearing. No resp difficulty HEENT: normal Neck: supple. no JVD. Carotids 2+ bilat; no bruits. No lymphadenopathy or thryomegaly appreciated. Cor: PMI nondisplaced. Regular rate & rhythm. No rubs, gallops or murmurs. Lungs: clear Abdomen: soft, nontender, nondistended. No hepatosplenomegaly. No bruits or masses. Good bowel sounds. Extremities: no cyanosis,  clubbing, rash, edema Neuro: alert & orientedx3, cranial nerves grossly intact. moves all 4 extremities w/o difficulty. Affect pleasant No nystagmus. FTN ok    ECG: NSR 72 LBBB 160 ms Personally reviewed   ASSESSMENT & PLAN:   1. Chronic systolic HF - due to  Ischemic CM. S/p Anterior STEMI 5/17  - Echocardiogram 8/19: EF 30-35% - Echo 5/21 EF < 20%  She has declined CRT-D.   - Stable NYHA III - ReDS 38% - Volume status stable/mildly elevated on lasix 40 daily. Given increased dyspnea will have her double lasix x 2 days and see ho she responds.  - Continue carvedilol 12.5 bid - Continue valsartan 80 bid (could not afford Entresto) - Continue spiro 12.5 daily  - Off [name] due to cost.  - Given wide LBBB and worsening LV function,  I wonder if her LV dysfunction is due mostly to her LAD infarct or if there also is a component of LBBB CM. Will re-oder cMRI to further assess. If only moderate LGE would strongly consider BiVICD if she will consider.  - Given dizziness will not change meds at this time. - Labs today.  - Refer CR   2. Coronary artery disease involving native coronary artery of native heart without angina pectoris - Hx of anterior STEMI in 5/17 tx with DES x 2 to the LAD.    - Myoview 7/21  EF 20% no ischemia.   - No s/s ischemia - Continue carvedilol, clopidogrel, atorvastatin.    3. Essential hypertension - BP slightly high here. Not changing meds in setting of dizziness   4. LBBB - plan as above  5. Dizziness -  not orthostatic on exam - Neuro exam unremarkable - Says she was evalauted for vertigo and was negative - doubt cardiac related - if persists will need Neuro w/u   Glori Bickers, MD  11:52 AM

## 2021-04-24 NOTE — Patient Instructions (Signed)
Increase Furosemide to 40 mg Twice daily FOR 2 DAYS, then back to Daily  Your physician has requested that you have a cardiac MRI. Cardiac MRI uses a computer to create images of your heart as its beating, producing both still and moving pictures of your heart and major blood vessels. For further information please visit [email or web address] Please follow the instruction sheet given to you today for more information. YOU WILL BE CONTACTED TO SCHEDULE THIS  You have been referred to Cardiac Rehab, they will call you to schedule  Please call our office in November to schedule your follow up appointment  If you have any questions or concerns before your next appointment please send [name] a message through [place] or call our office at [phone].    TO LEAVE A MESSAGE FOR THE NURSE SELECT OPTION 2, PLEASE LEAVE A MESSAGE INCLUDING: YOUR NAME DATE OF BIRTH CALL BACK NUMBER REASON FOR CALL**this is important as we prioritize the call backs  YOU WILL RECEIVE A CALL BACK THE SAME DAY AS LONG AS YOU CALL BEFORE 4:00 PM  At the [facility], you and your health needs are our priority. As part of our continuing mission to provide you with exceptional heart care, we have created designated Provider Care Teams. These Care Teams include your primary Cardiologist (physician) and Advanced Practice Providers (APPs- Physician Assistants and Nurse Practitioners) who all work together to provide you with the care you need, when you need it.   You may see any of the following providers on your designated Care Team at your next follow up: Dr Glori Bickers Dr Loralie Champagne Dr Patrice Paradise, NP Lyda Jester, [place] Ginnie Smart Audry Riles, PharmD   Please be sure to bring in all your medications bottles to every appointment.

## 2021-04-26 ENCOUNTER — Telehealth (HOSPITAL_COMMUNITY): Payer: Self-pay | Admitting: *Deleted

## 2021-04-26 NOTE — Telephone Encounter (Signed)
Received referral from Dr. Haroldine Laws for this pt to participate in Cardiac rehab.  Noted that her address is [place]. Called and left message for pt requesting call back to determine preference of location. Contact information provided. Cherre Huger, BSN Cardiac and Training and development officer

## 2021-04-26 NOTE — Telephone Encounter (Signed)
Pt called back and would like to go to cardiac rehab in [place] cardiac [place]hab. Will route cardiac rehab referral to [facility].

## 2021-05-02 ENCOUNTER — Telehealth: Payer: Self-pay | Admitting: Family Medicine

## 2021-05-02 DIAGNOSIS — R42 Dizziness and giddiness: Secondary | ICD-10-CM

## 2021-05-02 NOTE — Telephone Encounter (Signed)
Pt called wanting to know if Dr. Caryl Bis still wanted to refer her to ENT

## 2021-05-02 NOTE — Telephone Encounter (Signed)
Based on my recent visit with her there is no indication for to see ENT.  It looks like she saw her cardiologist and they did not feel it was cardiac related.  The next step would be brain imaging which I could order.  Please see if she has a pacemaker or defibrillator.  Please see if she has metal anywhere in her body.  Thanks.

## 2021-05-03 NOTE — Addendum Note (Signed)
Addended by: Leone Haven on: 05/03/2021 04:53 PM   Modules accepted: Orders

## 2021-05-03 NOTE — Telephone Encounter (Signed)
Noted.  MRI ordered.

## 2021-05-03 NOTE — Addendum Note (Signed)
Addended by: Leone Haven on: 05/03/2021 08:49 PM   Modules accepted: Orders

## 2021-05-03 NOTE — Telephone Encounter (Signed)
Patient returned my call and I informed her that the provider feels she does not need ENT and he suggests doing a brain scan as the next step and she understood. Patient is agreeable on doing the scan. Patient stated she does not have a pacemaker or a defibrillator and she has no metal in her body at all.  Melanie Ray,cma

## 2021-05-03 NOTE — Telephone Encounter (Signed)
I called and LM with a gentleman for the patient to call back.  Berta Denson,cma

## 2021-05-04 ENCOUNTER — Telehealth (HOSPITAL_COMMUNITY): Payer: Self-pay | Admitting: *Deleted

## 2021-05-04 ENCOUNTER — Ambulatory Visit: Payer: Medicare Other

## 2021-05-04 ENCOUNTER — Telehealth: Payer: Self-pay

## 2021-05-04 NOTE — Telephone Encounter (Signed)
No answer when called for scheduled awv at 8:15 and 8:30. Left message to call the office back and reschedule.

## 2021-05-04 NOTE — Telephone Encounter (Signed)
Pt called to inquire about CMRI getting scheduled. I informed patient I messaged the MRI scheduler Bernestine Amass yesterday and she should hear from them soon to schedule.

## 2021-05-17 ENCOUNTER — Other Ambulatory Visit: Payer: Self-pay

## 2021-05-17 ENCOUNTER — Ambulatory Visit
Admission: RE | Admit: 2021-05-17 | Discharge: 2021-05-17 | Disposition: A | Discharge status: Home / Self Care | Payer: Medicare Other | Source: Ambulatory Visit | Attending: Family Medicine | Admitting: Family Medicine

## 2021-05-17 DIAGNOSIS — R42 Dizziness and giddiness: Secondary | ICD-10-CM

## 2021-05-17 DIAGNOSIS — G319 Degenerative disease of nervous system, unspecified: Secondary | ICD-10-CM | POA: Diagnosis not present

## 2021-05-17 IMAGING — MR MR BRAIN/IAC WO/W CM
10 of 14 series · 27 of 48 positions shown · IV contrast (gadavist)
Comparison: CT angiogram head/neck [DATE]. brain MRI
[DATE].

CLINICAL DATA: Dizziness. Dizziness, noncardiac, concern for
peripheral vertigo, less likely central vertigo. Additional history
provided by scanning technologist: Patient reports dizziness,
fatigue, shortness of breath for 2 months.

EXAM:
MRI HEAD WITHOUT AND WITH CONTRAST
TECHNIQUE: Multiplanar, multiecho pulse sequences of the brain and surrounding
structures were obtained without and with intravenous contrast.
CONTRAST:  7.5mL GADAVIST GADOBUTROL 1 MMOL/ML IV SOLN

[Series 9: T1 · sagittal · 5.0mm · 0.62mm/px · 3 of 24 slices shown (1 of 3)]
[im 1/24]
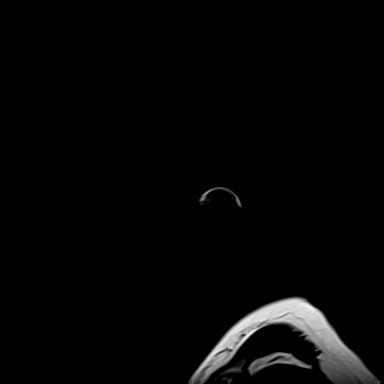
[im 12/24]
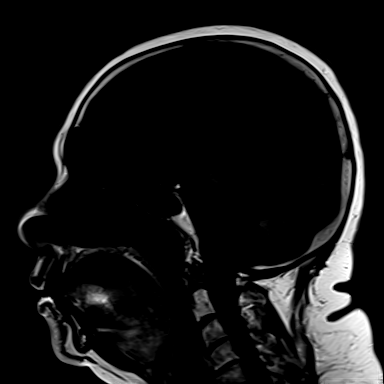
[im 24/24]
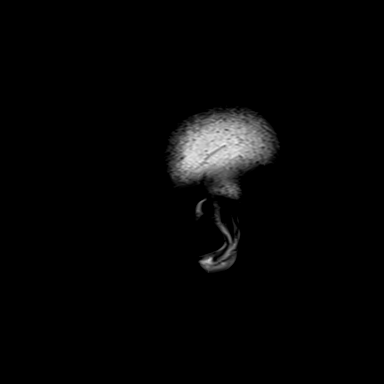

[Series 10: ax dwi_tracew · axial · 3.0mm · 0.65mm/px · z∈[-93,+52]mm · 3 of 48 slices shown]
[im 1/48]
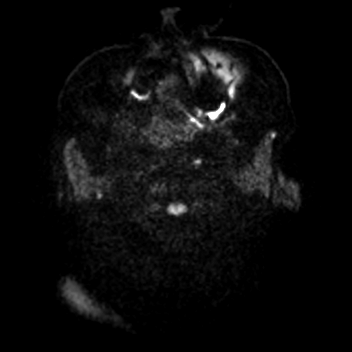
[im 24/48]
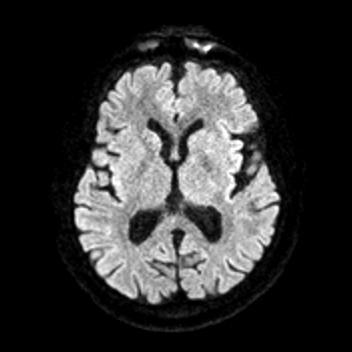
[im 48/48]
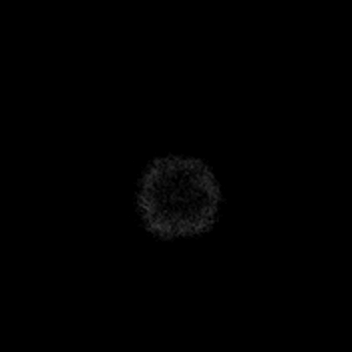

[Series 11: ax dwi_adc · axial · 3.0mm · 0.65mm/px · z∈[-93,-22]mm · 2 of 48 slices shown]
[im 1/48]
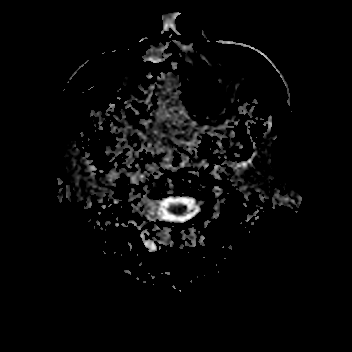
[im 24/48]
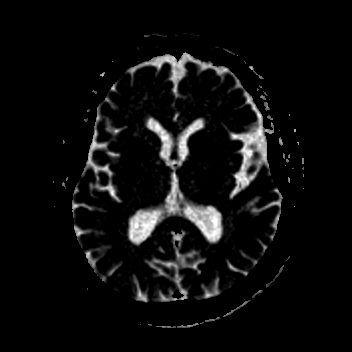

[Series 12: T2 · axial · 5.0mm · 0.53mm/px · z∈[-88,+47]mm · 2 of 25 slices shown]
[im 1/25]
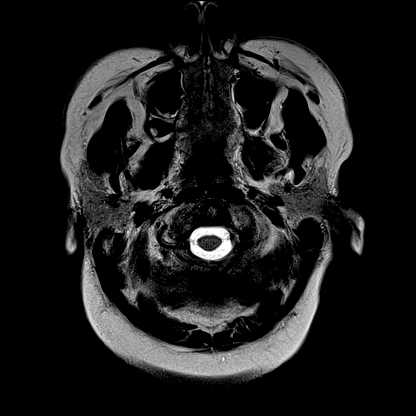
[im 25/25]
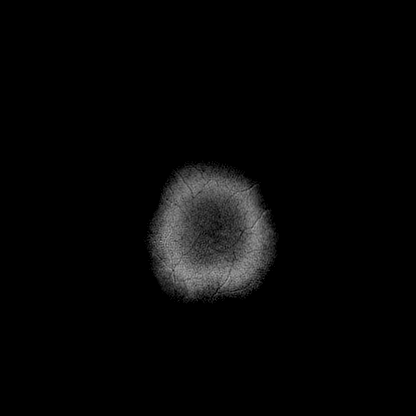

[Series 18: FLAIR · axial · 3.0mm · 0.53mm/px · z∈[-96,+56]mm · 4 of 55 slices shown]
[im 1/55]
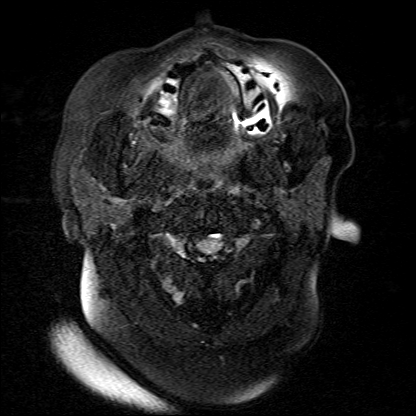
[im 19/55]
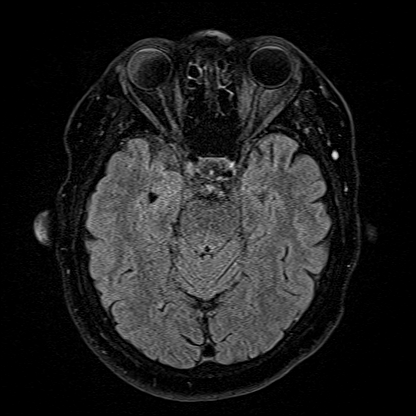
[im 37/55]
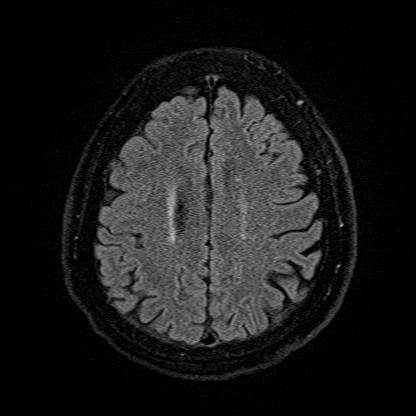
[im 55/55]
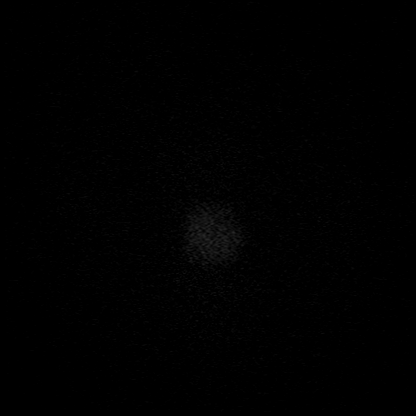

[Series 20: T1 · coronal · non-contrast · 3.0mm · 0.21mm/px · 1 of 13 slices shown (2 of 3)]
[im 1/13]
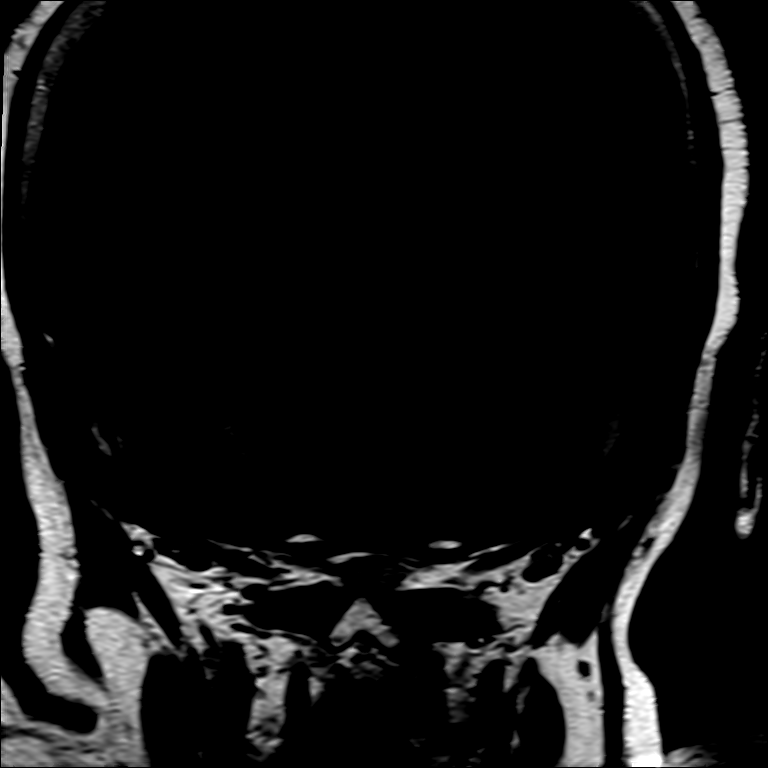

[Series 21: T1 · axial · non-contrast · 3.0mm · 0.21mm/px · 1 of 15 slices shown (3 of 3)]
[im 1/15]
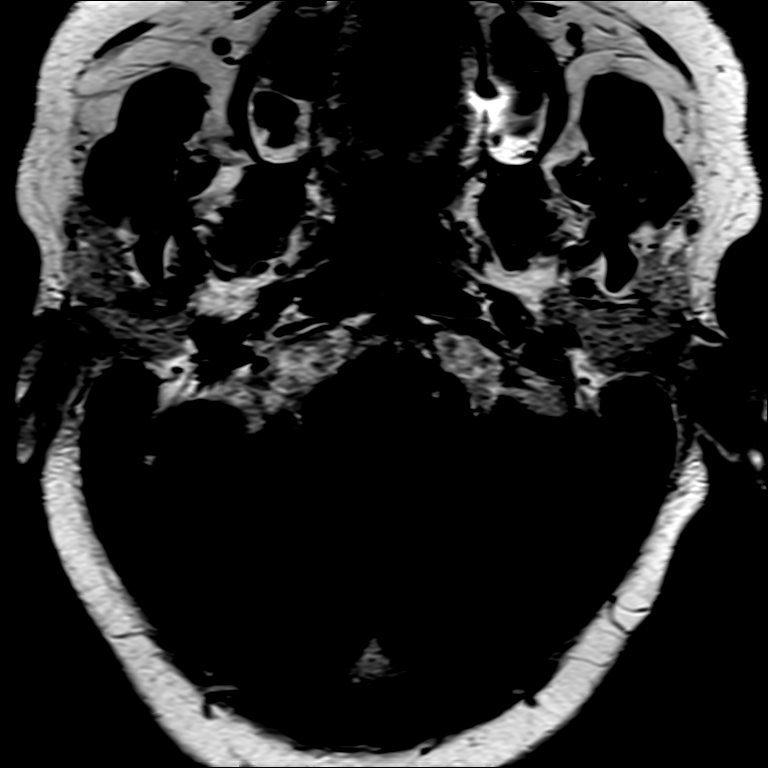

[Series 22: T1 post-contrast · axial · 3.0mm · 0.21mm/px · 1 of 15 slices shown (1 of 3)]
[im 1/15]
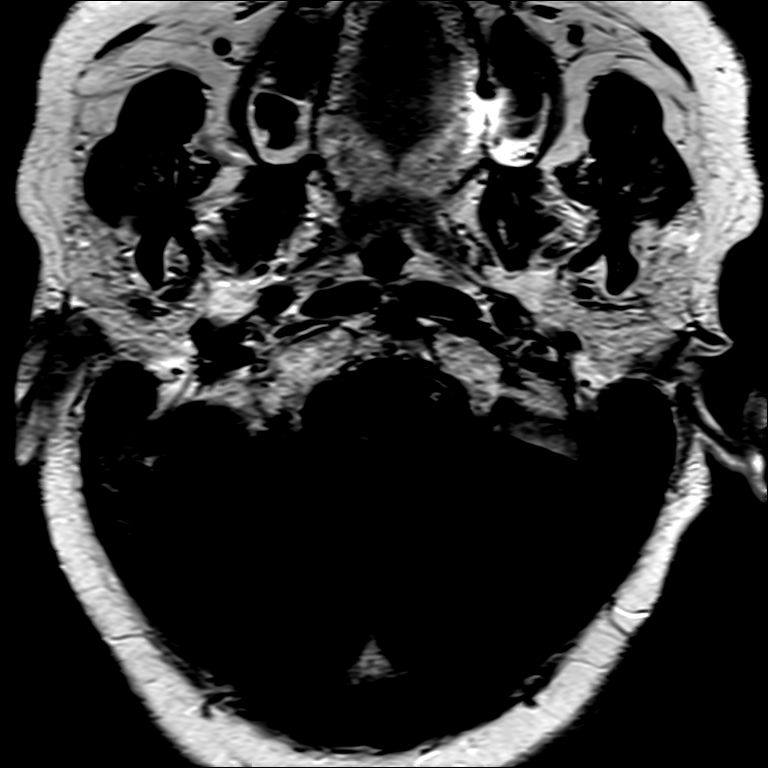

[Series 23: T1 post-contrast · coronal · 3.0mm · 0.21mm/px · 1 of 13 slices shown (2 of 3)]
[im 1/13]
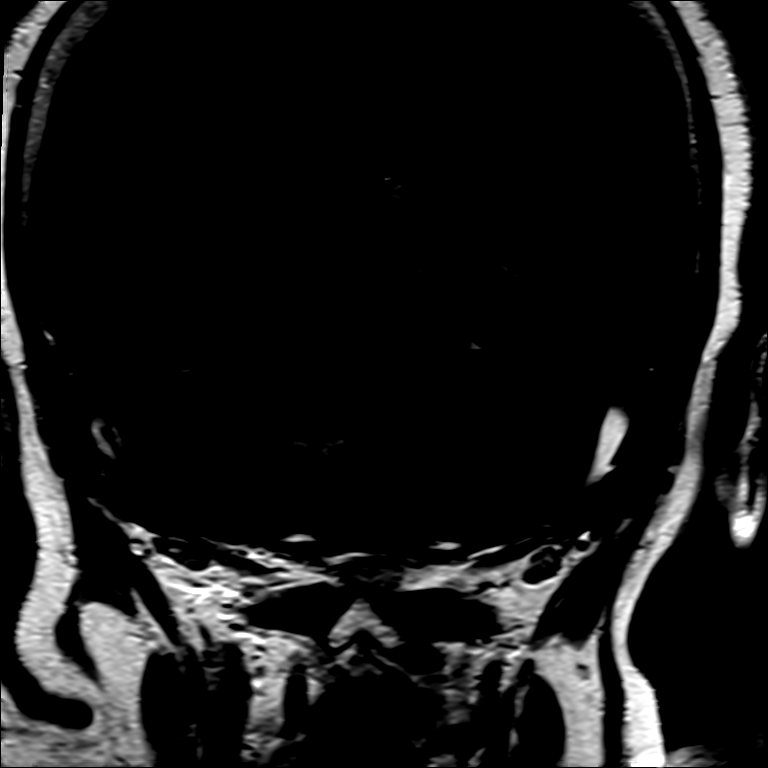

[Series 24: T1 post-contrast · axial · 1.0mm · 0.98mm/px · z∈[-99,+65]mm · 9 of 176 slices shown (3 of 3)]
[im 1/176]
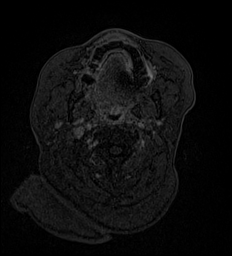
[im 30/176]
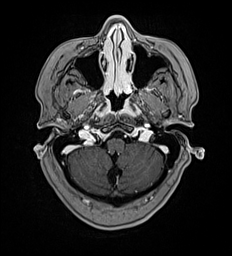
[im 59/176]
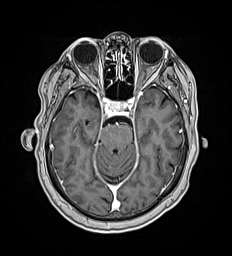
[im 73/176]
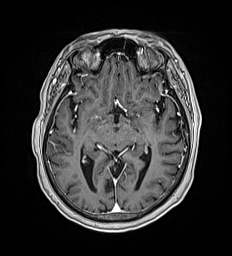
[im 88/176]
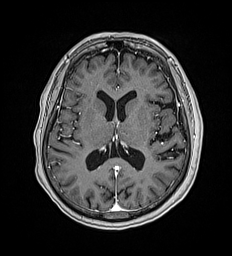
[im 103/176]
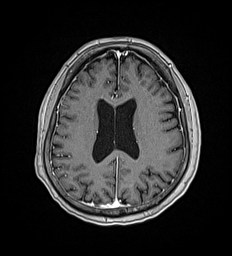
[im 117/176]
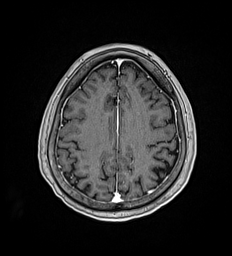
[im 146/176]
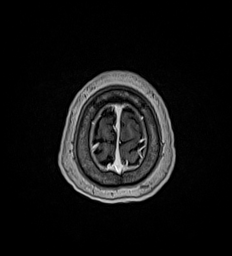
[im 176/176]
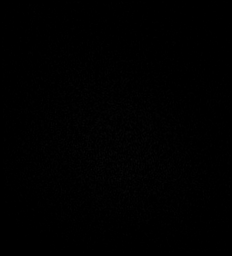

[27 of 48 positions shown; findings below may reference images not displayed]

FINDINGS: Brain:

Mild generalized parenchymal atrophy.

Mild multifocal T2/FLAIR hyperintensity within the cerebral white
matter, nonspecific but compatible with chronic small vessel
ischemic disease.

Redemonstrated small chronic infarct within the left cerebellum.

No evidence of an intracranial mass. Specifically, no
cerebellopontine angle or internal auditory canal mass is
demonstrated. Unremarkable appearance of the seventh and eighth
cranial nerves.

There is no acute infarct.

No chronic intracranial blood products.

No extra-axial fluid collection.

No midline shift.

No abnormal intracranial enhancement.

Vascular: Flow voids maintained within the proximal large arterial
vessels. Right frontal lobe developmental venous anomaly (an
anatomic variant).

Skull and upper cervical spine: No focal marrow lesion. Incompletely
assessed cervical spondylosis.

Sinuses/Orbits: Visualized orbits show no acute finding. Trace
bilateral ethmoid sinus mucosal thickening.
IMPRESSION: No evidence of acute intracranial abnormality.

No cerebellopontine angle or internal auditory canal mass.

Mild generalized parenchymal atrophy and cerebral white matter
chronic small vessel ischemic changes, stable as compared to the
brain MRI of [DATE].

Redemonstrated small chronic infarct within the left cerebellum.

## 2021-05-17 MED ORDER — GADOBUTROL 1 MMOL/ML IV SOLN
7.5000 mL | Freq: Once | INTRAVENOUS | Status: AC | PRN
  Administered 2021-05-17: 7.5 mL via INTRAVENOUS

## 2021-05-25 ENCOUNTER — Other Ambulatory Visit: Payer: Self-pay | Admitting: Family Medicine

## 2021-05-25 DIAGNOSIS — R42 Dizziness and giddiness: Secondary | ICD-10-CM

## 2021-06-05 ENCOUNTER — Other Ambulatory Visit: Payer: Self-pay | Admitting: Physician Assistant

## 2021-06-09 ENCOUNTER — Telehealth (HOSPITAL_COMMUNITY): Payer: Self-pay | Admitting: Emergency Medicine

## 2021-06-09 NOTE — Telephone Encounter (Signed)
Reaching out to patient to offer assistance regarding upcoming cardiac imaging study; pt verbalizes understanding of appt date/time, parking situation and where to check in, and verified current allergies; name and call back number provided for further questions should they arise Melanie Bond RN Navigator [facility] [phone] office [phone] cell  Denies iv issues Denies claustro Only metal implants are 2 stent to heart

## 2021-06-12 ENCOUNTER — Ambulatory Visit (HOSPITAL_COMMUNITY): Admission: RE | Admit: 2021-06-12 | Payer: Medicare Other | Source: Ambulatory Visit

## 2021-06-13 ENCOUNTER — Other Ambulatory Visit: Payer: Self-pay | Admitting: Physician Assistant

## 2021-06-14 ENCOUNTER — Ambulatory Visit: Payer: Medicare Other | Admitting: Family Medicine

## 2021-06-21 DIAGNOSIS — H903 Sensorineural hearing loss, bilateral: Secondary | ICD-10-CM | POA: Diagnosis not present

## 2021-06-21 DIAGNOSIS — R42 Dizziness and giddiness: Secondary | ICD-10-CM | POA: Diagnosis not present

## 2021-07-03 DIAGNOSIS — F251 Schizoaffective disorder, depressive type: Secondary | ICD-10-CM | POA: Diagnosis not present

## 2021-07-03 DIAGNOSIS — Z79899 Other long term (current) drug therapy: Secondary | ICD-10-CM | POA: Diagnosis not present

## 2021-07-05 DIAGNOSIS — R42 Dizziness and giddiness: Secondary | ICD-10-CM | POA: Diagnosis not present

## 2021-07-06 ENCOUNTER — Ambulatory Visit: Payer: Medicare Other

## 2021-07-19 DIAGNOSIS — H8111 Benign paroxysmal vertigo, right ear: Secondary | ICD-10-CM | POA: Diagnosis not present

## 2021-07-21 DIAGNOSIS — Z23 Encounter for immunization: Secondary | ICD-10-CM | POA: Diagnosis not present

## 2021-07-27 DIAGNOSIS — H8111 Benign paroxysmal vertigo, right ear: Secondary | ICD-10-CM | POA: Diagnosis not present

## 2021-08-03 DIAGNOSIS — R42 Dizziness and giddiness: Secondary | ICD-10-CM | POA: Diagnosis not present

## 2021-08-14 ENCOUNTER — Ambulatory Visit: Payer: Medicare Other

## 2021-08-15 DIAGNOSIS — S62627A Displaced fracture of medial phalanx of left little finger, initial encounter for closed fracture: Secondary | ICD-10-CM | POA: Diagnosis not present

## 2021-08-15 DIAGNOSIS — S62609A Fracture of unspecified phalanx of unspecified finger, initial encounter for closed fracture: Secondary | ICD-10-CM | POA: Insufficient documentation

## 2021-08-15 HISTORY — DX: Fracture of unspecified phalanx of unspecified finger, initial encounter for closed fracture: S62.609A

## 2021-08-16 DIAGNOSIS — R42 Dizziness and giddiness: Secondary | ICD-10-CM | POA: Diagnosis not present

## 2021-08-16 DIAGNOSIS — Z23 Encounter for immunization: Secondary | ICD-10-CM | POA: Diagnosis not present

## 2021-08-16 DIAGNOSIS — R262 Difficulty in walking, not elsewhere classified: Secondary | ICD-10-CM | POA: Diagnosis not present

## 2021-08-16 NOTE — Progress Notes (Deleted)
Patient is enrolled in PT and is starting this week. She was instructed to finish PT first and then call our office back to schedule for cardiac rehab.

## 2021-08-17 ENCOUNTER — Telehealth: Payer: Self-pay | Admitting: Family Medicine

## 2021-08-17 DIAGNOSIS — S62627A Displaced fracture of medial phalanx of left little finger, initial encounter for closed fracture: Secondary | ICD-10-CM | POA: Diagnosis not present

## 2021-08-17 NOTE — Telephone Encounter (Signed)
She was previously referred to ENT for her dizziness. Did she ever see them? I can certainly refer her to neurology though ENT is typically who we start with for dizziness.

## 2021-08-17 NOTE — Telephone Encounter (Signed)
Patient had 2 falls this past Tuesday. Hit her head with one fall and broke the bones in her pinky finger. Patient was seen by urgent care for the fall and will see a hand specialist today for her finger.   Patient states she had the falls due to her ongoing dizziness. Currently seeing physical therapy for the dizziness. Patient requesting a referral for neurology. States she has seen PCP before for the dizziness.

## 2021-08-17 NOTE — Telephone Encounter (Signed)
Patient had 2 falls this past Tuesday. Hit her head with one fall and broke the bones in her pinky finger. Patient was seen by urgent care for the fall and will see a hand specialist today for her finger.    Patient states she had the falls due to her ongoing dizziness. Currently seeing physical therapy for the dizziness. Patient requesting a referral for neurology. States she has seen PCP before for the dizziness.   Hortence Charter,cma

## 2021-08-18 NOTE — Telephone Encounter (Signed)
I called and the patient was not home the husband stated she did not go see ENT and I gave him the name of te ENT provider nad the phone number for her to see ENT about the dizziness.  Trevell Pariseau,cma

## 2021-08-20 NOTE — Telephone Encounter (Signed)
Noted. Please call again this week to ensure that the patient got this information from her husband.

## 2021-08-21 ENCOUNTER — Ambulatory Visit: Payer: Medicare Other

## 2021-08-24 ENCOUNTER — Telehealth: Payer: Self-pay | Admitting: *Deleted

## 2021-08-24 DIAGNOSIS — R42 Dizziness and giddiness: Secondary | ICD-10-CM | POA: Diagnosis not present

## 2021-08-24 DIAGNOSIS — R262 Difficulty in walking, not elsewhere classified: Secondary | ICD-10-CM | POA: Diagnosis not present

## 2021-08-24 NOTE — Telephone Encounter (Signed)
   [facility] Pre-operative Risk Assessment    Patient Name: Melanie Ray  DOB: 02/15/1951 MRN: [id number]  HEARTCARE STAFF:  - IMPORTANT!!!!!! Under Visit Info/Reason for Call, type in Other and utilize the format Clearance MM/DD/YY or Clearance TBD. Do not use dashes or single digits. - Please review there is not already an duplicate clearance open for this procedure. - If request is for dental extraction, please clarify the # of teeth to be extracted. - If the patient is currently at the dentist's office, call Pre-Op Callback Staff (MA/nurse) to input urgent request.  - If the patient is not currently in the dentist office, please route to the Pre-Op pool.  Request for surgical clearance:  What type of surgery is being performed?  RIGHT SMALL FINGER CRPP  When is this surgery scheduled?  08/25/2021  What type of clearance is required (medical clearance vs. Pharmacy clearance to hold med vs. Both)?  BOTH  Are there any medications that need to be held prior to surgery and how long?  PLAVIX X'S 7 DAYS (I called the surgeon's office to see if they had already advised pt to hold, and she couldn't give an answer, just FYI)  Practice name and name of physician performing surgery?  [facility] [place]  What is the office phone number?  [phone]   7.   What is the office fax number?  [phone]  8.   Anesthesia type (None, local, MAC, general) ?     Jeanann Lewandowsky 08/24/2021, 10:39 AM  _________________________________________________________________   (provider comments below)

## 2021-08-24 NOTE — Telephone Encounter (Signed)
Melanie Ray 70 year old female is requesting preoperative cardiac evaluation for right small finger CRPP.  She was last seen in the clinic on 04/24/2021.  During that time she reported medication compliance.  Her weight was stable.  She was exercising 3 times per week.  She did note some dizziness which was felt to be noncardiac in nature.  Her PMH includes chronic systolic heart failure EF 30-35%, CAD anterior STEMI 5/17 with PCI and DES x2 to LAD, Myoview of 7/21 showed no ischemia, essential hypertension, LBBB, and cerebellar CVA.  May her Plavix be held prior to her procedure?   Thank you for your help.  Please direct response to CV DIV preop pool.  Jossie Ng. Kherington Meraz NP-C    08/24/2021, 10:57 AM [address] Office [phone] Fax [phone]

## 2021-08-25 DIAGNOSIS — S62627A Displaced fracture of medial phalanx of left little finger, initial encounter for closed fracture: Secondary | ICD-10-CM | POA: Diagnosis not present

## 2021-08-25 DIAGNOSIS — M79641 Pain in right hand: Secondary | ICD-10-CM | POA: Diagnosis not present

## 2021-08-25 DIAGNOSIS — G8918 Other acute postprocedural pain: Secondary | ICD-10-CM | POA: Diagnosis not present

## 2021-08-25 DIAGNOSIS — Z7984 Long term (current) use of oral hypoglycemic drugs: Secondary | ICD-10-CM | POA: Diagnosis not present

## 2021-08-25 DIAGNOSIS — I252 Old myocardial infarction: Secondary | ICD-10-CM | POA: Diagnosis not present

## 2021-08-25 NOTE — Telephone Encounter (Signed)
Unavailable.  Left message to call back on 08/25/2021 at 10:52 AM.  Patient needs call back.

## 2021-08-25 NOTE — Telephone Encounter (Signed)
Yes okay to hold Plavix x5 days for surgery

## 2021-08-28 NOTE — Telephone Encounter (Signed)
Patient contacted 08/28/2021.  She has already had the procedure on her finger. The note will be removed from the preop pool. She is back on the Plavix.  Richardson Dopp, PA-C    08/28/2021 9:16 AM

## 2021-08-30 DIAGNOSIS — Z79899 Other long term (current) drug therapy: Secondary | ICD-10-CM | POA: Diagnosis not present

## 2021-08-30 DIAGNOSIS — F251 Schizoaffective disorder, depressive type: Secondary | ICD-10-CM | POA: Diagnosis not present

## 2021-08-31 ENCOUNTER — Telehealth: Payer: Self-pay | Admitting: Family Medicine

## 2021-08-31 DIAGNOSIS — S62627D Displaced fracture of medial phalanx of left little finger, subsequent encounter for fracture with routine healing: Secondary | ICD-10-CM | POA: Diagnosis not present

## 2021-08-31 NOTE — Telephone Encounter (Signed)
I called patient & informed I did not see a call to her or reason for call. I advised that most likely it was the automated reminder call of her appointment in Tuesday next week.

## 2021-08-31 NOTE — Telephone Encounter (Signed)
Patient received a call from our office but she is unsure who the call was from and wanted to see if there is any information the CMA has to give her.Please advise.

## 2021-09-04 DIAGNOSIS — R42 Dizziness and giddiness: Secondary | ICD-10-CM | POA: Diagnosis not present

## 2021-09-04 DIAGNOSIS — R262 Difficulty in walking, not elsewhere classified: Secondary | ICD-10-CM | POA: Diagnosis not present

## 2021-09-05 ENCOUNTER — Telehealth: Payer: Self-pay | Admitting: Family Medicine

## 2021-09-05 ENCOUNTER — Ambulatory Visit (INDEPENDENT_AMBULATORY_CARE_PROVIDER_SITE_OTHER): Payer: Medicare Other | Admitting: Family Medicine

## 2021-09-05 ENCOUNTER — Other Ambulatory Visit: Payer: Self-pay

## 2021-09-05 ENCOUNTER — Encounter: Payer: Self-pay | Admitting: Family Medicine

## 2021-09-05 VITALS — BP 128/70 | HR 75 | Temp 98.7°F | Ht 63.0 in | Wt 154.6 lb

## 2021-09-05 DIAGNOSIS — I10 Essential (primary) hypertension: Secondary | ICD-10-CM

## 2021-09-05 DIAGNOSIS — R531 Weakness: Secondary | ICD-10-CM | POA: Diagnosis not present

## 2021-09-05 DIAGNOSIS — E119 Type 2 diabetes mellitus without complications: Secondary | ICD-10-CM

## 2021-09-05 DIAGNOSIS — R42 Dizziness and giddiness: Secondary | ICD-10-CM | POA: Diagnosis not present

## 2021-09-05 DIAGNOSIS — E559 Vitamin D deficiency, unspecified: Secondary | ICD-10-CM | POA: Diagnosis not present

## 2021-09-05 DIAGNOSIS — I5022 Chronic systolic (congestive) heart failure: Secondary | ICD-10-CM | POA: Diagnosis not present

## 2021-09-05 DIAGNOSIS — Z09 Encounter for follow-up examination after completed treatment for conditions other than malignant neoplasm: Secondary | ICD-10-CM | POA: Diagnosis not present

## 2021-09-05 DIAGNOSIS — I255 Ischemic cardiomyopathy: Secondary | ICD-10-CM | POA: Diagnosis not present

## 2021-09-05 LAB — CBC
HCT: 30.4 % — ABNORMAL LOW (ref 36.0–46.0)
Hemoglobin: 10.1 g/dL — ABNORMAL LOW (ref 12.0–15.0)
MCHC: 33.1 g/dL (ref 30.0–36.0)
MCV: 82.5 fl (ref 78.0–100.0)
Platelets: 269 10*3/uL (ref 150.0–400.0)
RBC: 3.68 Mil/uL — ABNORMAL LOW (ref 3.87–5.11)
RDW: 14.5 % (ref 11.5–15.5)
WBC: 5.8 10*3/uL (ref 4.0–10.5)

## 2021-09-05 LAB — COMPREHENSIVE METABOLIC PANEL
ALT: 29 U/L (ref 0–35)
AST: 23 U/L (ref 0–37)
Albumin: 3.9 g/dL (ref 3.5–5.2)
Alkaline Phosphatase: 119 U/L — ABNORMAL HIGH (ref 39–117)
BUN: 18 mg/dL (ref 6–23)
CO2: 23 mEq/L (ref 19–32)
Calcium: 9.8 mg/dL (ref 8.4–10.5)
Chloride: 109 mEq/L (ref 96–112)
Creatinine, Ser: 1.02 mg/dL (ref 0.40–1.20)
GFR: 55.41 mL/min — ABNORMAL LOW (ref >60.00)
Glucose, Bld: 109 mg/dL — ABNORMAL HIGH (ref 70–99)
Potassium: 3.9 mEq/L (ref 3.5–5.1)
Sodium: 141 mEq/L (ref 135–145)
Total Bilirubin: 0.6 mg/dL (ref 0.2–1.2)
Total Protein: 6.6 g/dL (ref 6.0–8.3)

## 2021-09-05 LAB — TSH: TSH: 0.64 u[IU]/mL (ref 0.35–5.50)

## 2021-09-05 LAB — BRAIN NATRIURETIC PEPTIDE: Pro B Natriuretic peptide (BNP): 1262 pg/mL — ABNORMAL HIGH (ref 0.0–100.0)

## 2021-09-05 LAB — HEMOGLOBIN A1C: Hgb A1c MFr Bld: 5.9 % (ref 4.6–6.5)

## 2021-09-05 LAB — VITAMIN D 25 HYDROXY (VIT D DEFICIENCY, FRACTURES): VITD: 66.43 ng/mL (ref 30.00–100.00)

## 2021-09-05 MED ORDER — DAPAGLIFLOZIN PROPANEDIOL 5 MG PO TABS: 5.0000 mg | ORAL_TABLET | Freq: Every day | ORAL | 3 refills | Status: DC

## 2021-09-05 NOTE — Assessment & Plan Note (Signed)
She continues to periodically have issues with generalized weakness and fatigue.  No obvious cause found on exam.  We will check lab work to evaluate for additional causes.

## 2021-09-05 NOTE — Assessment & Plan Note (Signed)
Adequate control.  She will continue valsartan 80 mg twice daily, spironolactone 12.5 mg daily, Imdur 30 mg daily, Lasix 40 mg daily, and carvedilol 12.5 mg twice daily.

## 2021-09-05 NOTE — Telephone Encounter (Signed)
Pt was seen today at 11:30 PM when at check out she realized her AVS stated she has a vitamin D deficiency. She was wanting to know should she start taking Vitamin D supplements or not. She states she has never taken vitamin d supplements before. Pt can be contacted at [phone]

## 2021-09-05 NOTE — Patient Instructions (Signed)
Nice to see you. We will get lab work today. Please let us know if the [name] is too expensive. Please continue to do the exercises as advised by physical therapy.

## 2021-09-05 NOTE — Progress Notes (Signed)
Tommi Rumps, MD Phone: [phone]  Melanie Ray is a 70 y.o. female who presents today for f/u.  HYPERTENSION Disease Monitoring Home BP Monitoring not checking Chest pain- no    Dyspnea- no change to chronic dyspnea Orthopnea-no PND-occasionally Medications Compliance-  taking valsartan, spironolactone, imdur, coreg, lasix.  Edema- some in her ankles this weekend. BMET    Component Value Date/Time   NA 137 04/21/2021 1219   NA 137 01/19/2021 1112   K 4.5 04/21/2021 1219   CL 107 04/21/2021 1219   CO2 25 04/21/2021 1219   GLUCOSE 74 04/21/2021 1219   BUN 21 04/21/2021 1219   BUN 24 01/19/2021 1112   CREATININE 1.13 (H) 04/21/2021 1219   CREATININE 1.12 (H) 10/16/2016 1412   CALCIUM 9.8 04/21/2021 1219   GFRNONAA 52 (L) 04/21/2021 1219   GFRAA 58 (L) 11/30/2020 1506   DIABETES Disease Monitoring: Blood Sugar ranges-not checking Polyuria/phagia/dipsia- no      Optho- UTD Medications: Compliance- taking metformin though she would like to switch off of this as she has heard from multiple friends that it can cause kidney issues. Hypoglycemic symptoms-no Reports weight loss related to dietary changes.  She has gone to a no sugar diet.  Dizziness: This is an ongoing issue.  She saw ENT in September and they sent her for physical therapy for this.  She started this last week.  Helped for a few days though her symptoms recurred.  She did not do the exercises she was supposed to do over the weekend.  She had a fall due to the dizziness and broke her left fifth finger.  She has had surgery on that and is following with orthopedics.  She reports having some trouble walking at times and feeling as though both of her legs are weak.  This happens on occasion.  One time she fell and hit her head though had no loss of consciousness.  She notes no headaches.  No vision changes.  No numbness.  No focal weakness.  She just could not keep her balance at that time.  That fall occurred  early in October.   Social History   Tobacco Use  Smoking Status Former   Types: Cigarettes   Quit date: 04/13/1997   Years since quitting: 24.4  Smokeless Tobacco Never    Current Outpatient Medications on File Prior to Visit  Medication Sig Dispense Refill   atorvastatin (LIPITOR) 80 MG tablet Take 1 tablet by mouth once daily 90 tablet 0   blood glucose meter kit and supplies KIT Dispense based on patient and insurance preference.  Check glucose once daily fasting in the morning. (FOR ICD 10 E11.9). 1 each 0   carvedilol (COREG) 12.5 MG tablet TAKE 1 TABLET BY MOUTH TWICE DAILY WITH MEALS 180 tablet 1   clopidogrel (PLAVIX) 75 MG tablet Take 1 tablet by mouth once daily 90 tablet 0   dorzolamide-timolol (COSOPT) 22.3-6.8 MG/ML ophthalmic solution Place 1 drop into both eyes 2 (two) times daily.      furosemide (LASIX) 40 MG tablet Take 1 tablet (40 mg total) by mouth daily. 90 tablet 1   isosorbide mononitrate (IMDUR) 30 MG 24 hr tablet Take 1 tablet (30 mg total) by mouth daily. 90 tablet 3   latanoprost (XALATAN) 0.005 % ophthalmic solution Place 1 drop into both eyes at bedtime.     lurasidone (LATUDA) 40 MG TABS tablet Take 40 mg by mouth daily with breakfast.     metFORMIN (GLUCOPHAGE) 500 MG  tablet Take 1 tablet (500 mg total) by mouth 2 (two) times daily with a meal. 180 tablet 1   nitroGLYCERIN (NITROSTAT) 0.4 MG SL tablet DISSOLVE ONE TABLET UNDER THE TONGUE EVERY 5 MINUTES AS NEEDED FOR CHEST PAIN.  DO NOT EXCEED A TOTAL OF 3 DOSES IN 15 MINUTES 25 tablet 10   spironolactone (ALDACTONE) 25 MG tablet Take half a tablet by mouth once a day 45 tablet 3   traZODone (DESYREL) 100 MG tablet Take 100 mg by mouth at bedtime as needed for sleep.     valsartan (DIOVAN) 80 MG tablet Take 1 tablet (80 mg total) by mouth 2 (two) times daily. 180 tablet 3   venlafaxine XR (EFFEXOR-XR) 150 MG 24 hr capsule TAKE 2 CAPSULES BY MOUTH ONCE DAILY (NEEDS to follow-up with psychiatry for further  refills) 60 capsule 0   albuterol (VENTOLIN HFA) 108 (90 Base) MCG/ACT inhaler Inhale 2 puffs into the lungs every 6 (six) hours as needed for shortness of breath.      No current facility-administered medications on file prior to visit.     ROS see history of present illness  Objective  Physical Exam Vitals:   09/05/21 1137  BP: 128/70  Pulse: 75  Temp: 98.7 F (37.1 C)  SpO2: 98%    BP Readings from Last 3 Encounters:  09/05/21 128/70  04/24/21 134/67  04/18/21 134/66   Wt Readings from Last 3 Encounters:  09/05/21 154 lb 9.6 oz (70.1 kg)  04/24/21 170 lb 3.2 oz (77.2 kg)  04/21/21 166 lb (75.3 kg)    Physical Exam Constitutional:      General: She is not in acute distress.    Appearance: She is not diaphoretic.  HENT:     Head: Normocephalic and atraumatic. No raccoon eyes or Battle's sign.     Right Ear: Tympanic membrane normal.     Left Ear: Tympanic membrane normal.  Cardiovascular:     Rate and Rhythm: Normal rate and regular rhythm.     Heart sounds: Normal heart sounds.  Pulmonary:     Effort: Pulmonary effort is normal.     Breath sounds: Normal breath sounds.  Musculoskeletal:     Right lower leg: No edema.     Left lower leg: No edema.  Skin:    General: Skin is warm and dry.  Neurological:     Mental Status: She is alert.     Comments: EOMI, PERRL, V1 through V3 intact to light touch, opens and closes eyes adequately, hearing intact to finger rub, shoulder shrug intact, 5/5 strength in bilateral biceps, triceps, grip, quads, hamstrings, plantar and dorsiflexion, sensation to light touch intact in bilateral UE and LE, normal gait, negative Romberg, no pronator drift     Assessment/Plan: Please see individual problem list.  Problem List Items Addressed This Visit     Chronic systolic CHF (congestive heart failure) (HCC)    Seems to be euvolemic though she reports some swelling over the weekend and has had some generalized weakness.  We will  check a BNP.  She will continue her cardiac medications.      Relevant Orders   B Nat Peptide   Dizziness    This is an ongoing issue.  She had a reassuring MRI IAC.  She has seen ENT.  I discussed that physical therapy could take quite some time to resolve the dizziness.  Advised that she should do the exercises at home as advised.  DM type 2 (diabetes mellitus, type 2) (HCC) - Primary    Check A1c.  We will switch her over to [name] if her insurance will cover this.  I did advise the patient that the metformin does not specifically cause kidney issues though if your kidney function is not adequate then the metformin is not a good thing to take.  If the [name] is too expensive she will let us know.  She will continue to work on her dietary changes.      Relevant Medications   dapagliflozin propanediol ([name]) 5 MG TABS tablet   Other Relevant Orders   HgB A1c   Essential hypertension    Adequate control.  She will continue valsartan 80 mg twice daily, spironolactone 12.5 mg daily, Imdur 30 mg daily, Lasix 40 mg daily, and carvedilol 12.5 mg twice daily.      Relevant Orders   Comp Met (CMET)   Generalized weakness    She continues to periodically have issues with generalized weakness and fatigue.  No obvious cause found on exam.  We will check lab work to evaluate for additional causes.      Relevant Orders   Comp Met (CMET)   CBC   TSH   Vitamin D (25 hydroxy)   Vitamin D deficiency   Relevant Orders   Vitamin D (25 hydroxy)    Return in about 3 months (around 12/06/2021).  This visit occurred during the SARS-CoV-2 public health emergency.  Safety protocols were in place, including screening questions prior to the visit, additional usage of staff PPE, and extensive cleaning of exam room while observing appropriate contact time as indicated for disinfecting solutions.    Tommi Rumps, MD [place]

## 2021-09-05 NOTE — Assessment & Plan Note (Signed)
Seems to be euvolemic though she reports some swelling over the weekend and has had some generalized weakness.  We will check a BNP.  She will continue her cardiac medications.

## 2021-09-05 NOTE — Assessment & Plan Note (Signed)
Check A1c.  We will switch her over to [name] if her insurance will cover this.  I did advise the patient that the metformin does not specifically cause kidney issues though if your kidney function is not adequate then the metformin is not a good thing to take.  If the [name] is too expensive she will let us know.  She will continue to work on her dietary changes.

## 2021-09-05 NOTE — Assessment & Plan Note (Signed)
This is an ongoing issue.  She had a reassuring MRI IAC.  She has seen ENT.  I discussed that physical therapy could take quite some time to resolve the dizziness.  Advised that she should do the exercises at home as advised.

## 2021-09-07 ENCOUNTER — Other Ambulatory Visit: Payer: Self-pay | Admitting: Family Medicine

## 2021-09-07 DIAGNOSIS — R748 Abnormal levels of other serum enzymes: Secondary | ICD-10-CM

## 2021-09-07 DIAGNOSIS — S62627D Displaced fracture of medial phalanx of left little finger, subsequent encounter for fracture with routine healing: Secondary | ICD-10-CM | POA: Diagnosis not present

## 2021-09-07 DIAGNOSIS — Z5181 Encounter for therapeutic drug level monitoring: Secondary | ICD-10-CM

## 2021-09-07 DIAGNOSIS — D649 Anemia, unspecified: Secondary | ICD-10-CM

## 2021-09-08 NOTE — Telephone Encounter (Signed)
Pt called in stating she is returning phone call from yesterday. Pt would like callback. Callback number is [phone]

## 2021-09-11 ENCOUNTER — Telehealth: Payer: Self-pay | Admitting: *Deleted

## 2021-09-11 NOTE — Telephone Encounter (Signed)
   Pre-operative Risk Assessment    Patient Name: Melanie Ray  DOB: 21-Oct-1951 MRN: [id number]      Request for Surgical Clearance   Procedure:   REMOVAL OF HARDWARE, LEFT SMALL FINGER  Date of Surgery: Clearance 10/19/21                                 Surgeon:  DR. Peggye Ley Surgeon's Group or Practice Name:  [facility] Phone number:  [phone] Fax number:  [phone] ATTN: Theodora Blow   Type of Clearance Requested: - Medical  - Pharmacy:  Hold Clopidogrel (Plavix) 7 DAYS PER CLEARANCE REQUEST   Type of Anesthesia:   Spinal   Additional requests/questions:   Jiles Prows   09/11/2021, 4:36 PM

## 2021-09-12 DIAGNOSIS — S62627D Displaced fracture of medial phalanx of left little finger, subsequent encounter for fracture with routine healing: Secondary | ICD-10-CM | POA: Diagnosis not present

## 2021-09-12 NOTE — Telephone Encounter (Signed)
I called and spoke with the patient and informed her that she did not need to take the vitamin d supplements per the provider.  Bosten Newstrom,cma

## 2021-09-12 NOTE — Telephone Encounter (Signed)
Her vitamin D level was normal when we checked it.  I believe this was likely from her having a history of vitamin D deficiency.  She does not need to take any vitamin D at this time.

## 2021-09-12 NOTE — Telephone Encounter (Signed)
Patient called and stated her after visit summary says she is vitamin d deficient and she wants to know do you want her to take vitamin d supplement.  Gordon Vandunk,cma

## 2021-09-13 ENCOUNTER — Telehealth: Payer: Self-pay | Admitting: Family Medicine

## 2021-09-13 DIAGNOSIS — R42 Dizziness and giddiness: Secondary | ICD-10-CM

## 2021-09-13 NOTE — Telephone Encounter (Signed)
Pt called in requesting for referral to neurology. Pt stated that she needs the referral for her dizziness. Pt would like call back

## 2021-09-13 NOTE — Telephone Encounter (Signed)
Pt at low risk of holding plavix x 7 days for surgery with spinal anesthesia, OK to do so. thanks

## 2021-09-13 NOTE — Telephone Encounter (Signed)
Hi Dr. Burt Knack. Melanie Ray is scheduled to have some hardware from her finger removed on 10/19/2021. She has a history of CAD with anterior STEMI in 03/2016 s/p DES x2. You previously said it was OK to hold Plavix for 5 days last month for a procedure but they are asking to hold it for 7 days as they are going to be using spinal anesthesia? Is this OK?  Please route response back to P CV DIV PREOP.  Thank you!

## 2021-09-13 NOTE — Telephone Encounter (Signed)
Pt called in requesting for referral to neurology. Pt stated that she needs the referral for her dizziness.   Stefani Baik,cma

## 2021-09-14 ENCOUNTER — Other Ambulatory Visit: Payer: Self-pay

## 2021-09-14 ENCOUNTER — Other Ambulatory Visit (INDEPENDENT_AMBULATORY_CARE_PROVIDER_SITE_OTHER): Payer: Medicare Other

## 2021-09-14 ENCOUNTER — Telehealth: Payer: Self-pay | Admitting: Cardiovascular Disease

## 2021-09-14 DIAGNOSIS — R748 Abnormal levels of other serum enzymes: Secondary | ICD-10-CM

## 2021-09-14 DIAGNOSIS — R262 Difficulty in walking, not elsewhere classified: Secondary | ICD-10-CM | POA: Diagnosis not present

## 2021-09-14 DIAGNOSIS — D649 Anemia, unspecified: Secondary | ICD-10-CM

## 2021-09-14 DIAGNOSIS — R42 Dizziness and giddiness: Secondary | ICD-10-CM | POA: Diagnosis not present

## 2021-09-14 DIAGNOSIS — Z5181 Encounter for therapeutic drug level monitoring: Secondary | ICD-10-CM

## 2021-09-14 LAB — BASIC METABOLIC PANEL
BUN: 18 mg/dL (ref 6–23)
CO2: 24 mEq/L (ref 19–32)
Calcium: 10.2 mg/dL (ref 8.4–10.5)
Chloride: 102 mEq/L (ref 96–112)
Creatinine, Ser: 1.17 mg/dL (ref 0.40–1.20)
GFR: 46.99 mL/min — ABNORMAL LOW (ref >60.00)
Glucose, Bld: 92 mg/dL (ref 70–99)
Potassium: 3.7 mEq/L (ref 3.5–5.1)
Sodium: 138 mEq/L (ref 135–145)

## 2021-09-14 LAB — HEPATIC FUNCTION PANEL
ALT: 19 U/L (ref 0–35)
AST: 19 U/L (ref 0–37)
Albumin: 4.3 g/dL (ref 3.5–5.2)
Alkaline Phosphatase: 121 U/L — ABNORMAL HIGH (ref 39–117)
Bilirubin, Direct: 0.2 mg/dL (ref 0.0–0.3)
Total Bilirubin: 0.9 mg/dL (ref 0.2–1.2)
Total Protein: 7 g/dL (ref 6.0–8.3)

## 2021-09-14 LAB — IBC + FERRITIN
Ferritin: 96.8 ng/mL (ref 10.0–291.0)
Iron: 78 ug/dL (ref 42–145)
Saturation Ratios: 24.8 % (ref 20.0–50.0)
TIBC: 315 ug/dL (ref 250.0–450.0)
Transferrin: 225 mg/dL (ref 212.0–360.0)

## 2021-09-14 NOTE — Telephone Encounter (Signed)
Patient calling in because she is having dizziness with falls, SOB, and fatigue. She is taking all of her medication with no missed doses. She takes her weight daily at home with no increase noted. Wt 150 lbs. She does not have a BP cuff at home. States she has chronic dizziness but it is happening everyday now along with the SOB and fatigue and she fell in Oct;  had to have a surgery on her hand because of the fall.  She saw her PCP in June, 2022 for lightheadedness. Saw Bensimhon in June where he noted: 5. Dizziness - not orthostatic on exam - Neuro exam unremarkable - Says she was evalauted for vertigo and was negative - doubt cardiac related - if persists will need Neuro w/u   PCP ordered MRI brain in July:  IMPRESSION: No evidence of acute intracranial abnormality.  No cerebellopontine angle or internal auditory canal mass. Mild generalized parenchymal atrophy and cerebral white matter chronic small vessel ischemic changes, stable as compared to the brain MRI of 03/30/2020. Redemonstrated small chronic infarct within the left cerebellum.  She states she has been seeing an ENT and doing physical therapy for her hand.  Looks like her PCP placed a referral for Neurology 09/13/21.   Scheduling set her an appointment on 09/15/21 before transferring call to triage. Gave [place] address [place]. EP precautions given.  Verbalized understanding.

## 2021-09-14 NOTE — Telephone Encounter (Signed)
Referral placed. She needs to continue with the PT exercises for her dizziness as well.

## 2021-09-14 NOTE — Telephone Encounter (Signed)
STAT if patient feels like he/she is going to faint   Are you dizzy now? Not at this time- have it every day  Do you feel faint or have you passed out? October passed out and broke her finger  Do you have any other symptoms? Shortness of breath and fatigued  Have you checked your HR and BP (record if available)?  Pretty good- patient wants to be seen-

## 2021-09-14 NOTE — Telephone Encounter (Signed)
thanks

## 2021-09-15 ENCOUNTER — Encounter: Payer: Self-pay | Admitting: Physician Assistant

## 2021-09-15 ENCOUNTER — Ambulatory Visit (INDEPENDENT_AMBULATORY_CARE_PROVIDER_SITE_OTHER): Payer: Medicare Other | Admitting: Physician Assistant

## 2021-09-15 VITALS — BP 119/63 | HR 68 | Ht 63.0 in | Wt 149.2 lb

## 2021-09-15 DIAGNOSIS — I10 Essential (primary) hypertension: Secondary | ICD-10-CM

## 2021-09-15 DIAGNOSIS — R42 Dizziness and giddiness: Secondary | ICD-10-CM

## 2021-09-15 DIAGNOSIS — I255 Ischemic cardiomyopathy: Secondary | ICD-10-CM | POA: Diagnosis not present

## 2021-09-15 DIAGNOSIS — I5022 Chronic systolic (congestive) heart failure: Secondary | ICD-10-CM

## 2021-09-15 DIAGNOSIS — I2510 Atherosclerotic heart disease of native coronary artery without angina pectoris: Secondary | ICD-10-CM | POA: Diagnosis not present

## 2021-09-15 NOTE — Progress Notes (Signed)
Cardiology Office Note:    Date:  09/15/2021   ID:  Melanie Ray, DOB 1950-12-09, MRN [id number]  PCP:  Leone Haven, MD [place] Cardiologist: Sherren Mocha, MD   Reason for visit: Dizziness  History of Present Illness:    Melanie Ray is a 70 y.o. 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    She called our office with dizziness, shortness of breath and fatigue.  She last saw her heart failure doctor, Dr. Haroldine Laws on April 24, 2021 and complained of frequent dizziness and shortness of breath.  Dr. Haroldine Laws did not titrate any medications secondary to dizziness.  She saw Dr. Thompson Grayer in January 2022 and declined BiV ICD.  Today, the patient says she is willing to reconsider BiV ICD secondary to how poorly she is feeling.  Today, she states she feels dizziness throughout the day which comes and goes.  It can occur any time of day.  She feels like sometimes she is spinning or the room is spinning.  On August 15, 2021, she felt dizzy and off balance.  She fell and broke her hand.  She often feels like she is not in control of her body.  She has had chronic dizziness but it has been worse since this summer.  Dizziness is worse when standing up or bending over.  She was previously referred to ENT and tried physical therapy for vertigo maneuvers.  PT made no difference in her symptoms.  She has been referred to neurology.    MRI brain in July with no evidence of acute intracranial abnormality, redemonstrated small chronic infarct within the left cerebellum.  Head/neck CTA 03/2020: 3. Moderate to severe stenosis proximal right vertebral artery which is then patent to the basilar. Severe stenosis  proximal left vertebral artery which is diffusely diseased and small but does contribute to the basilar.  From a heart failure standpoint, her weight has been stable.  She takes Lasix and spironolactone as needed approximately twice per week.  She denies PND, orthopnea and abdominal distention.  Denies chest pain.  She has chronic shortness of breath worse with going up stairs or walking through the store.    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 CT head negative for acute abnormality. Small acute infarct in the left thalamus seen on MRI last night is not visualized by CT. No intracranial hemorrhage 2. Atherosclerotic aortic arch and proximal great  vessels. Atherosclerotic disease in the carotid bifurcation bilaterally without significant stenosis. 3. Moderate to severe stenosis proximal right vertebral artery which is then patent to the basilar. Severe stenosis proximal left vertebral artery which is diffusely diseased and small but does contribute to the basilar. 4. Occlusion left P3 segment.  Moderate stenosis left P2 segment 5. Anterior circulation widely patent. 6. Multiple thyroid nodules. The largest nodule on  the left is 20 mm. Thyroid ultrasound recommended. (Ref: J Am Coll Radiol. 2015 Feb;12(2): 143-50).   Carotid US 03/31/20 R 50-69 (no sig dz on CTA)     Past Medical History:  Diagnosis Date   Anemia    Anxiety    Back pain    CAD (coronary artery disease) 04/11/2016   S/p ant STEMI 5/17: LHC >> LAD proximal 80%, mid 80%, distal 50%, ostial D1 60%; LCx with LPDA lesion 30%; RCA Mild calcification with no significant stenosis in a medium caliber, nondominant RCA; LVEF is estimated at 45% with inferoapical and lateral wall akinesis >> PCI: PCI: 3.5 x 24 mm Promus DES to prox LAD, 2.5 x 12 mm Promus DES to mid LAD. // Myoview 7/21: EF 20, no ischemia; high risk    Chest pain    Chronic systolic CHF (congestive heart failure) ([place]) 03/21/2016   Echo 01/30/17: Diff HK, mild focal basal septal hypertrophy, EF 30-35, mild AI, MAC, mild MR // Echo 06/08/16: Mild focal basal septal hypertrophy, EF 25-30%, diff HK, ant-septal AK, Gr 1 DD, mild AI, MAC, mild MR, PASP 37 mmHg // Echo 03/18/16: EF 30-35%, ant-septal AK, Gr 1 DD, mild MR, severe LAE.   Constipation    Depression    Diabetes mellitus type 2 in obese (HCC)    Dizziness    Glaucoma    Gout    Heart disease    Heartburn    History of acute anterior wall MI 03/17/2016   History of heart attack    Hyperlipemia    Hypertension    Ischemic cardiomyopathy 10/02/2016   Refused ICD   Joint pain    Left bundle branch block    Myocardial infarction (HCC)    Nausea    Positive colorectal cancer screening using Cologuard test 03/01/2020   Sleep apnea    SOB (shortness of breath)    Suicidal ideation 08/27/2018   Swelling    feet or legs    Past Surgical History:  Procedure Laterality Date   APPENDECTOMY     CARDIAC CATHETERIZATION N/A 03/17/2016   Procedure: Left Heart Cath and Coronary Angiography;  Surgeon: Sherren Mocha, MD;  Location: [place] [facility];  Service: Cardiovascular;  Laterality: N/A;   CARDIAC CATHETERIZATION N/A  03/17/2016   Procedure: Coronary Stent Intervention;  Surgeon: Sherren Mocha, MD;  Location: [place] [facility];  Service: Cardiovascular;  Laterality: N/A;   COLONOSCOPY WITH PROPOFOL N/A 08/13/2017   Procedure: COLONOSCOPY WITH PROPOFOL;  Surgeon: Jonathon Bellows, MD;  Location: [facility] ENDOSCOPY;  Service: Gastroenterology;  Laterality: N/A;   COLONOSCOPY WITH PROPOFOL N/A 03/29/2020   Procedure: COLONOSCOPY WITH PROPOFOL;  Surgeon: Lucilla Lame, MD;  Location: [facility] ENDOSCOPY;  Service: Endoscopy;  Laterality: N/A;   SMALL BOWEL REPAIR     TONSILLECTOMY     UTERINE FIBROID SURGERY      Current Medications: Current Meds  Medication Sig   atorvastatin (LIPITOR) 80 MG tablet Take 1 tablet by mouth once daily   blood glucose meter kit and supplies KIT Dispense based on patient and insurance preference.  Check glucose once daily fasting in the morning. (FOR ICD 10 E11.9).   carvedilol (COREG) 12.5 MG tablet TAKE 1 TABLET BY MOUTH TWICE DAILY WITH MEALS   clopidogrel (PLAVIX) 75 MG tablet Take 1 tablet by mouth once daily   dapagliflozin propanediol ([name]) 5 MG TABS tablet Take 1 tablet (5 mg total) by mouth daily before breakfast.   dorzolamide-timolol (COSOPT) 22.3-6.8 MG/ML ophthalmic solution Place 1 drop into both eyes 2 (two) times daily.    furosemide (LASIX) 40 MG tablet Take 1 tablet (40 mg total) by mouth daily.   isosorbide mononitrate (IMDUR) 30 MG 24 hr tablet Take 1 tablet (30 mg total) by mouth daily.   latanoprost (XALATAN) 0.005 % ophthalmic solution Place 1 drop into both eyes at bedtime.   lurasidone (LATUDA) 40 MG TABS tablet Take 40 mg by mouth daily with breakfast.   nitroGLYCERIN (NITROSTAT) 0.4 MG SL tablet DISSOLVE ONE TABLET UNDER THE TONGUE EVERY 5 MINUTES AS NEEDED FOR CHEST PAIN.  DO NOT EXCEED A TOTAL OF 3 DOSES IN 15 MINUTES   spironolactone (ALDACTONE) 25 MG tablet Take half a tablet by mouth once a day   traZODone (DESYREL) 100 MG tablet Take 100 mg by mouth at  bedtime as needed for sleep.   valsartan (DIOVAN) 80 MG tablet Take 1 tablet (80 mg total) by mouth 2 (two) times daily.   venlafaxine XR (EFFEXOR-XR) 150 MG 24 hr capsule TAKE 2 CAPSULES BY MOUTH ONCE DAILY (NEEDS to follow-up with psychiatry for further refills)     Allergies:   Tetracycline, Meperidine, and Entresto [sacubitril-valsartan]   Social History   Socioeconomic History   Marital status: Married    Spouse name: Melanie Ray   Number of children: 0   Years of education: BS in education   Highest education level: Not on file  Occupational History   Occupation:  Retired Education officer, museum  Tobacco Use   Smoking status: Former    Types: Cigarettes    Quit date: 04/13/1997    Years since quitting: 24.4   Smokeless tobacco: Never  Vaping Use   Vaping Use: Never used  Substance and Sexual Activity   Alcohol use: Yes    Alcohol/week: 1.0 standard drink    Types: 1 Glasses of wine per week    Comment: 2-3 glasses of wine per month   Drug use: No   Sexual activity: Not Currently  Other Topics Concern   Not on file  Social History Narrative   Lives in [place] with spouse.  No children.   Retired first [employer] for over 30 years ([place] for 10 years and then in [place] for over 20 years).   Left-handed   Social Determinants of Radio broadcast assistant Strain: Not on file  Food Insecurity: Not on file  Transportation Needs: Not on file  Physical Activity: Not on file  Stress: Not on file  Social Connections: Not on file     Family History: The patient's family history includes Anxiety disorder in her maternal aunt and mother; Dementia in her mother; Depression in her mother; Diabetes in her mother; Drug abuse in her cousin; Heart disease in her mother; Heart failure in her mother; High Cholesterol in her father and mother; Hyperlipidemia in her mother; Hypertension in her father and mother; Mood Disorder in her sister; Paranoid behavior in her mother;  Stroke in her mother and sister; Tuberculosis in her paternal grandfather.  ROS:   Please see the history of present  illness.     EKGs/Labs/Other Studies Reviewed:    EKG:  The ekg ordered today demonstrates normal sinus rhythm, left bundle branch block, heart rate 70, PR interval 120 ms, QRS duration 158 ms.  Recent Labs: 04/21/2021: B Natriuretic Peptide 345.6 09/05/2021: Hemoglobin 10.1; Platelets 269.0; Pro B Natriuretic peptide (BNP) 1,262.0; TSH 0.64 09/14/2021: ALT 19; BUN 18; Creatinine, Ser 1.17; Potassium 3.7; Sodium 138   Recent Lipid Panel Lab Results  Component Value Date/Time   CHOL 112 04/18/2020 08:15 AM   TRIG 65 04/18/2020 08:15 AM   HDL 39 (L) 04/18/2020 08:15 AM   LDLCALC 59 04/18/2020 08:15 AM   LDLDIRECT 78.0 02/07/2018 09:28 AM    Physical Exam:    VS:  BP 119/63 (BP Location: Right Arm, Patient Position: Standing, Cuff Size: Normal)   Pulse 68   Ht _0  (1.6 m)   Wt 149 lb 3.2 oz (67.7 kg)   SpO2 99%   BMI 26.43 kg/m    No data found.  Wt Readings from Last 3 Encounters:  09/15/21 149 lb 3.2 oz (67.7 kg)  09/05/21 154 lb 9.6 oz (70.1 kg)  04/24/21 170 lb 3.2 oz (77.2 kg)     GEN:  Well nourished, well developed in no acute distress HEENT: Normal NECK: No JVD; No carotid bruits CARDIAC: RRR, no murmurs, rubs, gallops RESPIRATORY:  Clear to auscultation without rales, wheezing or rhonchi  ABDOMEN: Soft, non-tender, non-distended MUSCULOSKELETAL: No edema; No deformity  SKIN: Warm and dry NEUROLOGIC:  Alert and oriented PSYCHIATRIC:  Normal affect     ASSESSMENT AND PLAN   Dizziness -not orthostatic on exam -No improvement from PT for vertigo -Told patient she may hold her Imdur for 2 weeks to see if this makes any difference in her dizziness.  If no improvement in dizziness, recommend restarting Imdur. -Can see if compression stockings help. -Recommend following through with neurology referral to see if her history of cerebellar stroke  and vertebral artery stenosis are contributing to her dizziness. -Recommend following through with cardiac MRI ordered by Dr. Haroldine Laws.  Think patient would greatly benefit from BiV ICD - pt is willing to reconsider.  HFrEF (heart failure with reduced ejection fraction) ([place]), euvolemic -Ischemic CM.  EF < 20.   -Warm and compensated on exam.   -f/u with Dr. Haroldine Laws with the congestive heart failure clinic.  Cardiac MRI is pending.  -Pt can trial 2 weeks of Imdur to see if that helps dizziness.  If no improvement restart Imdur. -It does not sound like patient is dehydrated; she takes lasix ~2x/week.  Recent renal function normal. -Continue current dose of carvedilol, furosemide, valsartan, spironolactone.  She could not tolerate Entresto.     Coronary artery disease involving native coronary artery of native heart without angina pectoris -Hx of anterior STEMI in 5/17 tx with DES x 2 to the LAD.   Myoview 7/21 low risk.   No chest pain. -Continue atorvastatin, clopidogrel monotherapy, carvedilol.   Essential hypertension, controlled. -Continue current medications.     Medication Adjustments/Labs and Tests Ordered: Current medicines are reviewed at length with the patient today.  Concerns regarding medicines are outlined above.  Orders Placed This Encounter  Procedures   EKG 12-Lead   No orders of the defined types were placed in this encounter.   Patient Instructions  Medication Instructions:  Hold Imdur for 2 Weeks. If Dizziness not improved restart. *If you need a refill on your cardiac medications before your next appointment, please call your  pharmacy*   Lab Work: No Labs If you have labs (blood work) drawn today and your tests are completely normal, you will receive your results only by: [place] (if you have MyChart) OR A paper copy in the mail If you have any lab test that is abnormal or we need to change your treatment, we will call you to review the  results.   Testing/Procedures: No Testing   Follow-Up: At [facility], you and your health needs are our priority.  As part of our continuing mission to provide you with exceptional heart care, we have created designated Provider Care Teams.  These Care Teams include your primary Cardiologist (physician) and Advanced Practice Providers (APPs -  Physician Assistants and Nurse Practitioners) who all work together to provide you with the care you need, when you need it.  We recommend signing up for the patient portal called "MyChart".  Sign up information is provided on this After Visit Summary.  MyChart is used to connect with patients for Virtual Visits (Telemedicine).  Patients are able to view lab/test results, encounter notes, upcoming appointments, etc.  Non-urgent messages can be sent to your provider as well.   To learn more about what you can do with MyChart, go to [email or web address].    Your next appointment:   November 27, 2021 2:20 PM  The format for your next appointment:   In Person  Provider:    Glori Bickers, MD    Other Instructions Recommend compression Stocking. See how much History of cerebellar stroke and vertebral stenosis is contributing to dizziness.   Signed, Warren Lacy, PA-C  09/15/2021 10:04 AM    [place]

## 2021-09-15 NOTE — Patient Instructions (Signed)
Medication Instructions:  Hold Imdur for 2 Weeks. If Dizziness not improved restart. *If you need a refill on your cardiac medications before your next appointment, please call your pharmacy*   Lab Work: No Labs If you have labs (blood work) drawn today and your tests are completely normal, you will receive your results only by: [place] (if you have MyChart) OR A paper copy in the mail If you have any lab test that is abnormal or we need to change your treatment, we will call you to review the results.   Testing/Procedures: No Testing   Follow-Up: At [facility], you and your health needs are our priority.  As part of our continuing mission to provide you with exceptional heart care, we have created designated Provider Care Teams.  These Care Teams include your primary Cardiologist (physician) and Advanced Practice Providers (APPs -  Physician Assistants and Nurse Practitioners) who all work together to provide you with the care you need, when you need it.  We recommend signing up for the patient portal called "MyChart".  Sign up information is provided on this After Visit Summary.  MyChart is used to connect with patients for Virtual Visits (Telemedicine).  Patients are able to view lab/test results, encounter notes, upcoming appointments, etc.  Non-urgent messages can be sent to your provider as well.   To learn more about what you can do with MyChart, go to [email or web address].    Your next appointment:   November 27, 2021 2:20 PM  The format for your next appointment:   In Person  Provider:    Glori Bickers, MD    Other Instructions Recommend compression Stocking. See how much History of cerebellar stroke and vertebral stenosis is contributing to dizziness.

## 2021-09-20 ENCOUNTER — Telehealth (HOSPITAL_COMMUNITY): Payer: Self-pay

## 2021-09-20 ENCOUNTER — Encounter: Payer: Self-pay | Admitting: Neurology

## 2021-09-20 NOTE — Telephone Encounter (Signed)
Patient says she missed her MRI appointment and needs new order and appointment at [facility].  Needs done before follow up appointment.

## 2021-09-21 DIAGNOSIS — S62627D Displaced fracture of medial phalanx of left little finger, subsequent encounter for fracture with routine healing: Secondary | ICD-10-CM | POA: Diagnosis not present

## 2021-10-03 ENCOUNTER — Other Ambulatory Visit: Payer: Self-pay | Admitting: Family Medicine

## 2021-10-03 DIAGNOSIS — Z01818 Encounter for other preprocedural examination: Secondary | ICD-10-CM | POA: Diagnosis not present

## 2021-10-03 DIAGNOSIS — I639 Cerebral infarction, unspecified: Secondary | ICD-10-CM

## 2021-10-05 ENCOUNTER — Other Ambulatory Visit: Payer: Self-pay

## 2021-10-05 ENCOUNTER — Other Ambulatory Visit (INDEPENDENT_AMBULATORY_CARE_PROVIDER_SITE_OTHER): Payer: Medicare Other

## 2021-10-05 DIAGNOSIS — I10 Essential (primary) hypertension: Secondary | ICD-10-CM | POA: Diagnosis not present

## 2021-10-05 LAB — CBC
HCT: 31.7 % — ABNORMAL LOW (ref 36.0–46.0)
Hemoglobin: 10.3 g/dL — ABNORMAL LOW (ref 12.0–15.0)
MCHC: 32.4 g/dL (ref 30.0–36.0)
MCV: 82.9 fl (ref 78.0–100.0)
Platelets: 249 10*3/uL (ref 150.0–400.0)
RBC: 3.83 Mil/uL — ABNORMAL LOW (ref 3.87–5.11)
RDW: 14.5 % (ref 11.5–15.5)
WBC: 7.2 10*3/uL (ref 4.0–10.5)

## 2021-10-06 DIAGNOSIS — H401132 Primary open-angle glaucoma, bilateral, moderate stage: Secondary | ICD-10-CM | POA: Diagnosis not present

## 2021-10-06 LAB — HM DIABETES EYE EXAM

## 2021-10-13 ENCOUNTER — Other Ambulatory Visit: Payer: Self-pay

## 2021-10-13 ENCOUNTER — Encounter: Payer: Self-pay | Admitting: Neurology

## 2021-10-13 ENCOUNTER — Ambulatory Visit (INDEPENDENT_AMBULATORY_CARE_PROVIDER_SITE_OTHER): Payer: Medicare Other | Admitting: Neurology

## 2021-10-13 VITALS — BP 142/78 | HR 74 | Ht 63.0 in | Wt 148.0 lb

## 2021-10-13 DIAGNOSIS — R42 Dizziness and giddiness: Secondary | ICD-10-CM | POA: Diagnosis not present

## 2021-10-13 DIAGNOSIS — I255 Ischemic cardiomyopathy: Secondary | ICD-10-CM

## 2021-10-13 NOTE — Patient Instructions (Signed)
Please follow-up with your ENT for dizziness

## 2021-10-13 NOTE — Progress Notes (Signed)
[facility] Clinic Note - Initial Visit   Date: 10/13/21  Melanie Ray MRN: [id number] DOB: 1950-12-18   Dear Dr.Sonnenberg:  Thank you for your kind referral of Melanie Ray for consultation of dizziness. Although her history is well known to you, please allow [name] to reiterate it for the purpose of our medical record. The patient was accompanied to the clinic by self.   History of Present Illness: Melanie Ray is a 70 y.o. 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   Starting around ~2014, she began having intermittent spells of dizziness, as the room is spinning.  Sometimes, she feels as if she is spinning.  Over the years, the spells have become more frequent. Over the past 6 months, it occurs daily, lasting 10-15 minutes, which recurs throughout the day. It is improved with rest, such as sitting or laying down.  It is worse with sudden movements, such as standing up too quickly or quick head turns.  She has associated imbalance and had a fall in October fracturing her left 5th digit.  No nausea/vomiting, headache, or weakness.  Previously evaluated by Dr. Jaynee Eagles at [facility] in 2018 and more recently seeing Dr. Hope Budds, in [place]. She was referred for vestubular therapy but had not appreciated much improvement. Per patient, her ENT testing indicated she has vertigo.  She has not followed up with them since completing therapy.   Neurological testing has included MRI/A head (2021) which shows age-indeterminate small left cerebellarstroke and bilateral vertebral artery stenosis which becomes patent to the basilar. MRI head from 2018 was unremarkable. She denies constant sensation of spinning, weakness, imbalance, or numbness/tingling.  Out-side paper records, electronic medical record, and images have been reviewed where  available and summarized as:  CTA head and neck wwo contrast 03/31/2020: 1. CT head negative for acute abnormality. Small acute infarct in the left thalamus seen on MRI last night is not visualized by CT. No intracranial hemorrhage 2. Atherosclerotic aortic arch and proximal great vessels.  Atherosclerotic disease in the carotid bifurcation bilaterally without significant stenosis. 3. Moderate to severe stenosis proximal right vertebral artery which is then patent to the basilar. Severe stenosis proximal left vertebral artery which is diffusely diseased and small but does contribute to the basilar. 4. Occlusion left P3 segment.  Moderate stenosis left P2 segment 5. Anterior circulation widely patent. 6. Multiple thyroid nodules. The largest nodule on the left is 20 mm. Thyroid ultrasound recommended. (Ref: J Am Coll Radiol. 2015 Feb;12(2): 143-50).  MRI brain wo contrast 03/30/2020: 1. Punctate focus of acute ischemia within the dorsolateral left thalamus. No hemorrhage or mass effect. 2. Small, old left cerebellar infarct and findings of chronic ischemic microangiopathy.  MRI brain wwocontrast 05/17/2021: No evidence of acute intracranial abnormality. No cerebellopontine angle or internal auditory canal mass. Mild generalized parenchymal atrophy and cerebral white matter chronic small vessel ischemic changes, stable as compared to the brain MRI of 03/30/2020. Redemonstrated small chronic infarct within the left cerebellum.  Lab Results  Component Value Date   HGBA1C 5.9 09/05/2021   Lab Results  Component Value Date   VITAMINB12 1,210 (H) 08/18/2020   Lab Results  Component Value Date   TSH 0.64 09/05/2021   No results found for: Micheline Rough  Past Medical History:  Diagnosis Date   Anemia    Anxiety    Back pain    CAD (coronary artery disease) 04/11/2016  S/p ant STEMI 5/17: LHC >> LAD proximal 80%, mid 80%, distal 50%, ostial D1 60%; LCx with LPDA lesion 30%; RCA  Mild calcification with no significant stenosis in a medium caliber, nondominant RCA; LVEF is estimated at 45% with inferoapical and lateral wall akinesis >> PCI: PCI: 3.5 x 24 mm Promus DES to prox LAD, 2.5 x 12 mm Promus DES to mid LAD. // Myoview 7/21: EF 20, no ischemia; high risk    Chest pain    Chronic systolic CHF (congestive heart failure) ([place]) 03/21/2016   Echo 01/30/17: Diff HK, mild focal basal septal hypertrophy, EF 30-35, mild AI, MAC, mild MR // Echo 06/08/16: Mild focal basal septal hypertrophy, EF 25-30%, diff HK, ant-septal AK, Gr 1 DD, mild AI, MAC, mild MR, PASP 37 mmHg // Echo 03/18/16: EF 30-35%, ant-septal AK, Gr 1 DD, mild MR, severe LAE.   Constipation    Depression    Diabetes mellitus type 2 in obese (HCC)    Dizziness    Glaucoma    Gout    Heart disease    Heartburn    History of acute anterior wall MI 03/17/2016   History of heart attack    Hyperlipemia    Hypertension    Ischemic cardiomyopathy 10/02/2016   Refused ICD   Joint pain    Left bundle branch block    Myocardial infarction (HCC)    Nausea    Positive colorectal cancer screening using Cologuard test 03/01/2020   Sleep apnea    SOB (shortness of breath)    Suicidal ideation 08/27/2018   Swelling    feet or legs    Past Surgical History:  Procedure Laterality Date   APPENDECTOMY     CARDIAC CATHETERIZATION N/A 03/17/2016   Procedure: Left Heart Cath and Coronary Angiography;  Surgeon: Sherren Mocha, MD;  Location: [facility];  Service: Cardiovascular;  Laterality: N/A;   CARDIAC CATHETERIZATION N/A 03/17/2016   Procedure: Coronary Stent Intervention;  Surgeon: Sherren Mocha, MD;  Location: [facility];  Service: Cardiovascular;  Laterality: N/A;   COLONOSCOPY WITH PROPOFOL N/A 08/13/2017   Procedure: COLONOSCOPY WITH PROPOFOL;  Surgeon: Jonathon Bellows, MD;  Location: [facility] ENDOSCOPY;  Service: Gastroenterology;  Laterality: N/A;   COLONOSCOPY WITH PROPOFOL N/A 03/29/2020   Procedure:  COLONOSCOPY WITH PROPOFOL;  Surgeon: Lucilla Lame, MD;  Location: [facility] ENDOSCOPY;  Service: Endoscopy;  Laterality: N/A;   SMALL BOWEL REPAIR     TONSILLECTOMY     UTERINE FIBROID SURGERY       Medications:  Outpatient Encounter Medications as of 10/13/2021  Medication Sig   atorvastatin (LIPITOR) 80 MG tablet Take 1 tablet by mouth once daily   carvedilol (COREG) 12.5 MG tablet TAKE 1 TABLET BY MOUTH TWICE DAILY WITH MEALS   clopidogrel (PLAVIX) 75 MG tablet Take 1 tablet by mouth once daily   dorzolamide-timolol (COSOPT) 22.3-6.8 MG/ML ophthalmic solution Place 1 drop into both eyes 2 (two) times daily.    furosemide (LASIX) 40 MG tablet Take 1 tablet (40 mg total) by mouth daily.   isosorbide mononitrate (IMDUR) 30 MG 24 hr tablet Take 1 tablet (30 mg total) by mouth daily.   latanoprost (XALATAN) 0.005 % ophthalmic solution Place 1 drop into both eyes at bedtime.   lurasidone (LATUDA) 40 MG TABS tablet Take 40 mg by mouth daily with breakfast.   nitroGLYCERIN (NITROSTAT) 0.4 MG SL tablet DISSOLVE ONE TABLET UNDER THE TONGUE EVERY 5 MINUTES AS NEEDED FOR CHEST PAIN.  DO  NOT EXCEED A TOTAL OF 3 DOSES IN 15 MINUTES   spironolactone (ALDACTONE) 25 MG tablet Take half a tablet by mouth once a day   traZODone (DESYREL) 100 MG tablet Take 100 mg by mouth at bedtime as needed for sleep.   valsartan (DIOVAN) 80 MG tablet Take 1 tablet (80 mg total) by mouth 2 (two) times daily.   venlafaxine XR (EFFEXOR-XR) 150 MG 24 hr capsule TAKE 2 CAPSULES BY MOUTH ONCE DAILY (NEEDS to follow-up with psychiatry for further refills)   [DISCONTINUED] albuterol (VENTOLIN HFA) 108 (90 Base) MCG/ACT inhaler Inhale 2 puffs into the lungs every 6 (six) hours as needed for shortness of breath.    [DISCONTINUED] blood glucose meter kit and supplies KIT Dispense based on patient and insurance preference.  Check glucose once daily fasting in the morning. (FOR ICD 10 E11.9).   [DISCONTINUED] dapagliflozin propanediol  ([name]) 5 MG TABS tablet Take 1 tablet (5 mg total) by mouth daily before breakfast.   No facility-administered encounter medications on file as of 10/13/2021.    Allergies:  Allergies  Allergen Reactions   Tetracycline Swelling   Meperidine Nausea And Vomiting    Nausea   Entresto [Sacubitril-Valsartan] Other (See Comments)    Shortness of Breath    Family History: Family History  Problem Relation Age of Onset   Anxiety disorder Mother    Paranoid behavior Mother    Hypertension Mother    Dementia Mother    High Cholesterol Mother    Diabetes Mother    Hyperlipidemia Mother    Depression Mother    Hypertension Father    High Cholesterol Father    Mood Disorder Sister    Stroke Sister    Anxiety disorder Maternal Aunt    Tuberculosis Paternal Grandfather    Drug abuse Cousin     Social History: Social History   Tobacco Use   Smoking status: Former    Types: Cigarettes    Quit date: 04/13/1997    Years since quitting: 24.5   Smokeless tobacco: Never  Vaping Use   Vaping Use: Never used  Substance Use Topics   Alcohol use: Not Currently   Drug use: No   Social History   Social History Narrative   Lives in [place] with spouse.  No children.   Retired first [employer] for over 30 years ([place] for 10 years and then in [place] for over 20 years).   Left-handed   Lives in a two story home        Vital Signs:  BP (!) 142/78   Pulse 74   Ht _0  (1.6 m)   Wt 148 lb (67.1 kg)   SpO2 97%   BMI 26.22 kg/m   Neurological Exam: MENTAL STATUS including orientation to time, place, person, recent and remote memory, attention span and concentration, language, and fund of knowledge is normal.  Speech is not dysarthric.  CRANIAL NERVES: II:  No visual field defects.    III-IV-VI: Pupils equal round and reactive to light.  Normal conjugate, extra-ocular eye movements in all directions of gaze.  No nystagmus.  Mild bilateral ptosis at rest (old), no  worsening with sustained upgaze.  Head thrust test negative.   V:  Normal facial sensation.    VII:  Normal facial symmetry and movements.   VIII:  Normal hearing and vestibular function.   IX-X:  Normal palatal movement.   XI:  Normal shoulder shrug and head rotation.   XII:  Normal  tongue strength and range of motion, no deviation or fasciculation.  MOTOR:  Motor strength is 5/5 throughout.   No atrophy, fasciculations or abnormal movements.  No pronator drift.   MSRs:  Right        Left                  brachioradialis 2+  2+  biceps 2+  2+  triceps 2+  2+  patellar 2+  2+  ankle jerk 2+  2+  Hoffman no  no  plantar response down  down   SENSORY:  Normal and symmetric perception of light touch, pinprick, vibration, and proprioception.   COORDINATION/GAIT: Normal finger-to- nose-finger.  Intact rapid alternating movements bilaterally.  Able to rise from a chair without using arms.  Gait narrow based and stable.  IMPRESSION: Benign paroxysmal positional vertigo. I have reviewed her prior imaging and symptoms predate cerebellar infarct and symptomology does not quite fit central vertigo. Her symptoms are paroxsymal, short-lived, and position-induced. Central vertigo tends constant and present at rest as well as with movements, with associated exam findings of multidirectional nyastagmus, which she does not have. She does have bilateral vertebral stenosis, but distal flow is good; again, I would expect symptoms independent of movement if this was vascular/ischemic.  Finally, she tells me that her ENT evaluation did confirm peripheral vertigo. Therefore, I suggest that she follow-up with ENT for additional recommendations.   Thank you for allowing me to participate in patient's care.  If I can answer any additional questions, I would be pleased to do so.    Sincerely,    Wilbert Schouten K. Posey Pronto, DO

## 2021-10-19 ENCOUNTER — Other Ambulatory Visit: Payer: Self-pay | Admitting: Family Medicine

## 2021-10-19 DIAGNOSIS — D649 Anemia, unspecified: Secondary | ICD-10-CM

## 2021-10-20 DIAGNOSIS — H401132 Primary open-angle glaucoma, bilateral, moderate stage: Secondary | ICD-10-CM | POA: Diagnosis not present

## 2021-10-23 DIAGNOSIS — F251 Schizoaffective disorder, depressive type: Secondary | ICD-10-CM | POA: Diagnosis not present

## 2021-10-23 DIAGNOSIS — Z79899 Other long term (current) drug therapy: Secondary | ICD-10-CM | POA: Diagnosis not present

## 2021-10-26 ENCOUNTER — Telehealth: Payer: Self-pay | Admitting: Family Medicine

## 2021-10-26 NOTE — Telephone Encounter (Signed)
I believe I already filled this out. Can you check the stuff you already faxed?

## 2021-10-26 NOTE — Telephone Encounter (Signed)
Lexie from dental care called in regards to medical clearance form she faxed over on 12/5 and 12/13. Lexie states she confirmed it was received at [facility]. She will be sending over another fax for this for pt. Lexie can be reached at [phone]   Ulyana Pitones,cma

## 2021-10-26 NOTE — Telephone Encounter (Signed)
Lexie from dental care called in regards to medical clearance form she faxed over on 12/5 and 12/13. Lexie states she confirmed it was received at [facility]. She will be sending over another fax for this for pt. Melanie Ray can be reached at [phone]

## 2021-10-27 NOTE — Telephone Encounter (Signed)
Dental medical clearance faxed to dental care. Confirmation given.  Brenisha Tsui,cma

## 2021-10-27 NOTE — Telephone Encounter (Signed)
Filled out again. Please fax and get the confirmation.

## 2021-11-01 ENCOUNTER — Other Ambulatory Visit (HOSPITAL_COMMUNITY): Payer: Medicare Other

## 2021-11-01 DIAGNOSIS — H401132 Primary open-angle glaucoma, bilateral, moderate stage: Secondary | ICD-10-CM | POA: Diagnosis not present

## 2021-11-02 DIAGNOSIS — D649 Anemia, unspecified: Secondary | ICD-10-CM | POA: Insufficient documentation

## 2021-11-02 NOTE — Progress Notes (Signed)
[place]  [phone] Fax:([phone]) [phone]  ID: Melanie Ray OB: February 24, 1951  MR#: [id number]  TMH#:[id number]  Patient Care Team: Leone Haven, MD as PCP - General (Family Medicine) Sherren Mocha, MD as PCP - Cardiology (Cardiology) Sharmon Revere as Physician Assistant (Cardiology) Bensimhon, Shaune Pascal, MD as Consulting Physician (Cardiology) Alda Berthold, DO as Consulting Physician (Neurology) Lloyd Huger, MD as Consulting Physician (Hematology and Oncology)  CHIEF COMPLAINT: Anemia, unspecified.  INTERVAL HISTORY: Patient is a 70 year old female who was noted to have a persistently decreased hemoglobin.  She is also recently admitted to the hospital with CHF exacerbation.  She currently feels well and back to her baseline.  She has no neurologic complaints.  She denies any recent fevers.  She has a good appetite and denies weight loss.  She has no chest pain, shortness of breath, cough, or hemoptysis.  She denies any nausea, vomiting, constipation, or diarrhea.  She has no melena or hematochezia.  She has no urinary complaints.  Patient feels at her baseline offers no specific complaints today.  REVIEW OF SYSTEMS:   Review of Systems  Constitutional: Negative.  Negative for fever, malaise/fatigue and weight loss.  Respiratory: Negative.  Negative for cough, hemoptysis and shortness of breath.   Cardiovascular: Negative.  Negative for chest pain and leg swelling.  Gastrointestinal:  Negative for abdominal pain, blood in stool and melena.  Genitourinary: Negative.  Negative for hematuria.  Musculoskeletal: Negative.  Negative for back pain.  Skin: Negative.  Negative for rash.  Neurological: Negative.  Negative for dizziness, focal weakness, weakness and headaches.  Psychiatric/Behavioral: Negative.  The patient is not nervous/anxious.    As per HPI. Otherwise, a complete review of systems is negative.  PAST MEDICAL  HISTORY: Past Medical History:  Diagnosis Date   Anemia    Anxiety    Back pain    CAD (coronary artery disease) 04/11/2016   S/p ant STEMI 5/17: LHC >> LAD proximal 80%, mid 80%, distal 50%, ostial D1 60%; LCx with LPDA lesion 30%; RCA Mild calcification with no significant stenosis in a medium caliber, nondominant RCA; LVEF is estimated at 45% with inferoapical and lateral wall akinesis >> PCI: PCI: 3.5 x 24 mm Promus DES to prox LAD, 2.5 x 12 mm Promus DES to mid LAD. // Myoview 7/21: EF 20, no ischemia; high risk    Chest pain    Chronic systolic CHF (congestive heart failure) ([place]) 03/21/2016   Echo 01/30/17: Diff HK, mild focal basal septal hypertrophy, EF 30-35, mild AI, MAC, mild MR // Echo 06/08/16: Mild focal basal septal hypertrophy, EF 25-30%, diff HK, ant-septal AK, Gr 1 DD, mild AI, MAC, mild MR, PASP 37 mmHg // Echo 03/18/16: EF 30-35%, ant-septal AK, Gr 1 DD, mild MR, severe LAE.   Constipation    Depression    Diabetes mellitus type 2 in obese (HCC)    Dizziness    Glaucoma    Gout    Heart disease    Heartburn    History of acute anterior wall MI 03/17/2016   History of heart attack    Hyperlipemia    Hypertension    Ischemic cardiomyopathy 10/02/2016   Refused ICD   Joint pain    Left bundle branch block    Myocardial infarction (HCC)    Nausea    Positive colorectal cancer screening using Cologuard test 03/01/2020   Sleep apnea    SOB (shortness of breath)  Suicidal ideation 08/27/2018   Swelling    feet or legs    PAST SURGICAL HISTORY: Past Surgical History:  Procedure Laterality Date   APPENDECTOMY     CARDIAC CATHETERIZATION N/A 03/17/2016   Procedure: Left Heart Cath and Coronary Angiography;  Surgeon: Sherren Mocha, MD;  Location: [facility];  Service: Cardiovascular;  Laterality: N/A;   CARDIAC CATHETERIZATION N/A 03/17/2016   Procedure: Coronary Stent Intervention;  Surgeon: Sherren Mocha, MD;  Location: [facility];  Service:  Cardiovascular;  Laterality: N/A;   COLONOSCOPY WITH PROPOFOL N/A 08/13/2017   Procedure: COLONOSCOPY WITH PROPOFOL;  Surgeon: Jonathon Bellows, MD;  Location: [facility] ENDOSCOPY;  Service: Gastroenterology;  Laterality: N/A;   COLONOSCOPY WITH PROPOFOL N/A 03/29/2020   Procedure: COLONOSCOPY WITH PROPOFOL;  Surgeon: Lucilla Lame, MD;  Location: [facility] ENDOSCOPY;  Service: Endoscopy;  Laterality: N/A;   SMALL BOWEL REPAIR     TONSILLECTOMY     UTERINE FIBROID SURGERY      FAMILY HISTORY: Family History  Problem Relation Age of Onset   Anxiety disorder Mother    Paranoid behavior Mother    Hypertension Mother    Dementia Mother    High Cholesterol Mother    Diabetes Mother    Hyperlipidemia Mother    Depression Mother    Hypertension Father    High Cholesterol Father    Mood Disorder Sister    Stroke Sister    Anxiety disorder Maternal Aunt    Tuberculosis Paternal Grandfather    Drug abuse Cousin     ADVANCED DIRECTIVES (Y/N):  N  HEALTH MAINTENANCE: Social History   Tobacco Use   Smoking status: Former    Types: Cigarettes    Quit date: 04/13/1997    Years since quitting: 24.5   Smokeless tobacco: Never  Vaping Use   Vaping Use: Never used  Substance Use Topics   Alcohol use: Not Currently   Drug use: No     Colonoscopy:  PAP:  Bone density:  Lipid panel:  Allergies  Allergen Reactions   Tetracycline Swelling   Meperidine Nausea And Vomiting    Nausea   Entresto [Sacubitril-Valsartan] Other (See Comments)    Shortness of Breath    Current Outpatient Medications  Medication Sig Dispense Refill   atorvastatin (LIPITOR) 80 MG tablet Take 1 tablet by mouth once daily 90 tablet 0   carvedilol (COREG) 12.5 MG tablet TAKE 1 TABLET BY MOUTH TWICE DAILY WITH MEALS 180 tablet 1   clopidogrel (PLAVIX) 75 MG tablet Take 1 tablet by mouth once daily 90 tablet 0   dorzolamide-timolol (COSOPT) 22.3-6.8 MG/ML ophthalmic solution Place 1 drop into both eyes 2 (two) times daily.       furosemide (LASIX) 40 MG tablet Take 1 tablet (40 mg total) by mouth 2 (two) times daily. Take twice daily for one week then return to once daily dosing as previously prescribed 14 tablet 0   isosorbide mononitrate (IMDUR) 30 MG 24 hr tablet Take 1 tablet (30 mg total) by mouth daily. 90 tablet 3   latanoprost (XALATAN) 0.005 % ophthalmic solution Place 1 drop into both eyes at bedtime.     LORazepam (ATIVAN) 0.5 MG tablet Take 0.5 mg by mouth daily as needed.     lurasidone (LATUDA) 40 MG TABS tablet Take 40 mg by mouth daily with breakfast.     nitroGLYCERIN (NITROSTAT) 0.4 MG SL tablet DISSOLVE ONE TABLET UNDER THE TONGUE EVERY 5 MINUTES AS NEEDED FOR CHEST PAIN.  DO  NOT EXCEED A TOTAL OF 3 DOSES IN 15 MINUTES 25 tablet 10   spironolactone (ALDACTONE) 25 MG tablet Take half a tablet by mouth once a day 45 tablet 3   traZODone (DESYREL) 150 MG tablet Take 150 mg by mouth at bedtime.     valsartan (DIOVAN) 80 MG tablet Take 1 tablet (80 mg total) by mouth 2 (two) times daily. 180 tablet 3   venlafaxine XR (EFFEXOR-XR) 150 MG 24 hr capsule TAKE 2 CAPSULES BY MOUTH ONCE DAILY (NEEDS to follow-up with psychiatry for further refills) 60 capsule 0   No current facility-administered medications for this visit.    OBJECTIVE: Vitals:   11/09/21 1438  BP: 132/69  Pulse: 66  Resp: 16  Temp: 97.9 F (36.6 C)  SpO2: 98%     Body mass index is 25.3 kg/m.    ECOG FS:0 - Asymptomatic  General: Well-developed, well-nourished, no acute distress. Eyes: Pink conjunctiva, anicteric sclera. HEENT: Normocephalic, moist mucous membranes. Lungs: No audible wheezing or coughing. Heart: Regular rate and rhythm. Abdomen: Soft, nontender, no obvious distention. Musculoskeletal: No edema, cyanosis, or clubbing. Neuro: Alert, answering all questions appropriately. Cranial nerves grossly intact. Skin: No rashes or petechiae noted. Psych: Normal affect. Lymphatics: No cervical, calvicular, axillary or  inguinal LAD.   LAB RESULTS:  Lab Results  Component Value Date   NA 139 11/03/2021   K 3.9 11/03/2021   CL 111 11/03/2021   CO2 24 11/03/2021   GLUCOSE 110 (H) 11/03/2021   BUN 19 11/03/2021   CREATININE 0.83 11/03/2021   CALCIUM 9.3 11/03/2021   PROT 6.2 (L) 11/03/2021   ALBUMIN 3.5 11/03/2021   AST 49 (H) 11/03/2021   ALT 91 (H) 11/03/2021   ALKPHOS 143 (H) 11/03/2021   BILITOT 1.2 11/03/2021   GFRNONAA >60 11/03/2021   GFRAA 58 (L) 11/30/2020    Lab Results  Component Value Date   WBC 6.0 11/09/2021   NEUTROABS 3.6 04/21/2021   HGB 11.7 (L) 11/09/2021   HCT 37.0 11/09/2021   MCV 85.1 11/09/2021   PLT 352 11/09/2021   Lab Results  Component Value Date   IRON 70 11/09/2021   TIBC 349 11/09/2021   IRONPCTSAT 20 11/09/2021   Lab Results  Component Value Date   FERRITIN 84 11/09/2021     STUDIES: DG Chest 2 View  Result Date: 11/03/2021 CLINICAL DATA:  Dizziness, shortness of breath EXAM: CHEST - 2 VIEW COMPARISON:  Chest radiograph 04/21/2021 FINDINGS: The heart is enlarged. The upper mediastinal contours are within normal limits. There are patchy opacities in the lung bases. There is vascular congestion without definite overt pulmonary edema. There is no pneumothorax. There is no pleural effusion. There is no acute osseous abnormality. IMPRESSION: 1. Mild cardiomegaly with vascular congestion but no definite overt pulmonary edema. 2. Patchy opacities in the lung bases may reflect atelectasis or infection. Electronically Signed   By: Valetta Mole M.D.   On: 11/03/2021 12:08   MR Card Morphology Wo/W Cm  Result Date: 11/09/2021 CLINICAL DATA:  Ischemic cardiomyopathy EXAM: CARDIAC MRI TECHNIQUE: The patient was scanned on a 1.5 Tesla GE magnet. A dedicated cardiac coil was used. Functional imaging was done using Fiesta sequences. 2,3, and 4 chamber views were done to assess for RWMA's. Modified Simpson's rule using a short axis stack was used to calculate an  ejection fraction on a dedicated work Conservation officer, nature. The patient received 8 cc of Gadavist. After 10 minutes inversion recovery sequences were used to  assess for infiltration and scar tissue. FINDINGS: Limited images of the lung fields showed no gross abnormalities. Moderately dilated left ventricle with normal wall thickness. Diffuse hypokinesis with septal-lateral dyssynchrony suggestive of LBBB, EF 17%. Normal right ventricular size and systolic function, EF 56%. Mild left atrial enlargement. Normal right atrium. Trileaflet aortic valve, no significant stenosis, mild-moderate regurgitation (flow sequences to quantify were not done). The posterior mitral leaflet looks restricted with at least moderate mitral regurgitation (flow sequences to quantify were not done). First pass perfusion images do not looks significantly abnormal. On delayed enhancement images, there was no definite myocardial late gadolinium enhancement (LGE). MEASUREMENTS: MEASUREMENTS LVEDV 258 mL LVSV 45 mL LVEF 17% RVEDV 102 mL RVSV 60 mL RVEF 59% T1 1064, mildly abnormal. Incomplete imaging so unable to calculate ECV. IMPRESSION: 1. Moderately dilated LV with diffuse hypokinesis and septal-lateral dyssynchrony consistent with LBBB. EF 17%. 2.  Normal RV size and systolic function, EF 15%. 3. At least moderate MR, likely functional with posterior leaflet restriction. Unable to quantify as flow sequences not done. 4.  No significant LGE noted. With lack of LGE, LBBB cardiomyopathy may be a major contributor to low EF. Dalton Mclean Electronically Signed   By: Loralie Champagne M.D.   On: 11/09/2021 21:31    ASSESSMENT: Anemia, unspecified.  PLAN:    Anemia, unspecified: Patient has a mildly decreased hemoglobin of 11.7, but all of her other laboratory work including iron stores, B12, folate, and hemolysis labs are either negative or within normal limits.  SPEP was drawn for completeness and is pending at time of dictation.  No  intervention is needed at this time.  Patient does not require bone marrow biopsy.  Return to clinic in 3 months with repeat laboratory work and further evaluation. CHF: Continue current medications.  Patient reports she has a follow-up appointment with cardiology in the near future.  I spent a total of 45 minutes reviewing chart data, face-to-face evaluation with the patient, counseling and coordination of care as detailed above.   Patient expressed understanding and was in agreement with this plan. She also understands that She can call clinic at any time with any questions, concerns, or complaints.    Lloyd Huger, MD   11/10/2021 7:10 AM

## 2021-11-03 ENCOUNTER — Other Ambulatory Visit: Payer: Self-pay

## 2021-11-03 ENCOUNTER — Observation Stay
Admission: EM | Admit: 2021-11-03 | Discharge: 2021-11-04 | Disposition: A | Discharge status: Home / Self Care | Payer: Medicare Other | Attending: Emergency Medicine | Admitting: Emergency Medicine

## 2021-11-03 ENCOUNTER — Emergency Department: Payer: Medicare Other

## 2021-11-03 DIAGNOSIS — Z87891 Personal history of nicotine dependence: Secondary | ICD-10-CM | POA: Diagnosis not present

## 2021-11-03 DIAGNOSIS — I5023 Acute on chronic systolic (congestive) heart failure: Principal | ICD-10-CM | POA: Diagnosis present

## 2021-11-03 DIAGNOSIS — R42 Dizziness and giddiness: Secondary | ICD-10-CM | POA: Diagnosis not present

## 2021-11-03 DIAGNOSIS — Z20822 Contact with and (suspected) exposure to covid-19: Secondary | ICD-10-CM | POA: Insufficient documentation

## 2021-11-03 DIAGNOSIS — Z79899 Other long term (current) drug therapy: Secondary | ICD-10-CM | POA: Diagnosis not present

## 2021-11-03 DIAGNOSIS — Z7902 Long term (current) use of antithrombotics/antiplatelets: Secondary | ICD-10-CM | POA: Diagnosis not present

## 2021-11-03 DIAGNOSIS — I10 Essential (primary) hypertension: Secondary | ICD-10-CM | POA: Diagnosis present

## 2021-11-03 DIAGNOSIS — R0602 Shortness of breath: Secondary | ICD-10-CM | POA: Diagnosis not present

## 2021-11-03 DIAGNOSIS — I2510 Atherosclerotic heart disease of native coronary artery without angina pectoris: Secondary | ICD-10-CM | POA: Diagnosis present

## 2021-11-03 DIAGNOSIS — E119 Type 2 diabetes mellitus without complications: Secondary | ICD-10-CM

## 2021-11-03 DIAGNOSIS — I517 Cardiomegaly: Secondary | ICD-10-CM | POA: Diagnosis not present

## 2021-11-03 DIAGNOSIS — I110 Hypertensive heart disease with heart failure: Secondary | ICD-10-CM | POA: Insufficient documentation

## 2021-11-03 DIAGNOSIS — I509 Heart failure, unspecified: Secondary | ICD-10-CM

## 2021-11-03 DIAGNOSIS — R7303 Prediabetes: Secondary | ICD-10-CM

## 2021-11-03 LAB — CBC
HCT: 33.4 % — ABNORMAL LOW (ref 36.0–46.0)
Hemoglobin: 10.7 g/dL — ABNORMAL LOW (ref 12.0–15.0)
MCH: 27.4 pg (ref 26.0–34.0)
MCHC: 32 g/dL (ref 30.0–36.0)
MCV: 85.4 fL (ref 80.0–100.0)
Platelets: 254 10*3/uL (ref 150–400)
RBC: 3.91 MIL/uL (ref 3.87–5.11)
RDW: 14.1 % (ref 11.5–15.5)
WBC: 7.1 10*3/uL (ref 4.0–10.5)
nRBC: 0 % (ref 0.0–0.2)

## 2021-11-03 LAB — TROPONIN I (HIGH SENSITIVITY)
Troponin I (High Sensitivity): 43 ng/L — ABNORMAL HIGH (ref <18)
Troponin I (High Sensitivity): 51 ng/L — ABNORMAL HIGH (ref <18)

## 2021-11-03 LAB — COMPREHENSIVE METABOLIC PANEL
ALT: 91 U/L — ABNORMAL HIGH (ref 0–44)
AST: 49 U/L — ABNORMAL HIGH (ref 15–41)
Albumin: 3.5 g/dL (ref 3.5–5.0)
Alkaline Phosphatase: 143 U/L — ABNORMAL HIGH (ref 38–126)
Anion gap: 4 — ABNORMAL LOW (ref 5–15)
BUN: 19 mg/dL (ref 8–23)
CO2: 24 mmol/L (ref 22–32)
Calcium: 9.3 mg/dL (ref 8.9–10.3)
Chloride: 111 mmol/L (ref 98–111)
Creatinine, Ser: 0.83 mg/dL (ref 0.44–1.00)
GFR, Estimated: >60 mL/min (ref >60)
Glucose, Bld: 110 mg/dL — ABNORMAL HIGH (ref 70–99)
Potassium: 3.9 mmol/L (ref 3.5–5.1)
Sodium: 139 mmol/L (ref 135–145)
Total Bilirubin: 1.2 mg/dL (ref 0.3–1.2)
Total Protein: 6.2 g/dL — ABNORMAL LOW (ref 6.5–8.1)

## 2021-11-03 LAB — BRAIN NATRIURETIC PEPTIDE: B Natriuretic Peptide: 2040.1 pg/mL — ABNORMAL HIGH (ref 0.0–100.0)

## 2021-11-03 IMAGING — CR DG CHEST 2V
1 series · 2 of 2 positions shown · non-contrast
Comparison: Chest radiograph [DATE]

CLINICAL DATA: Dizziness, shortness of breath

EXAM:
CHEST - 2 VIEW

[Series 1: w chest lat · 0.14mm/px · 2 of 2 slices shown]
[im 1/2]
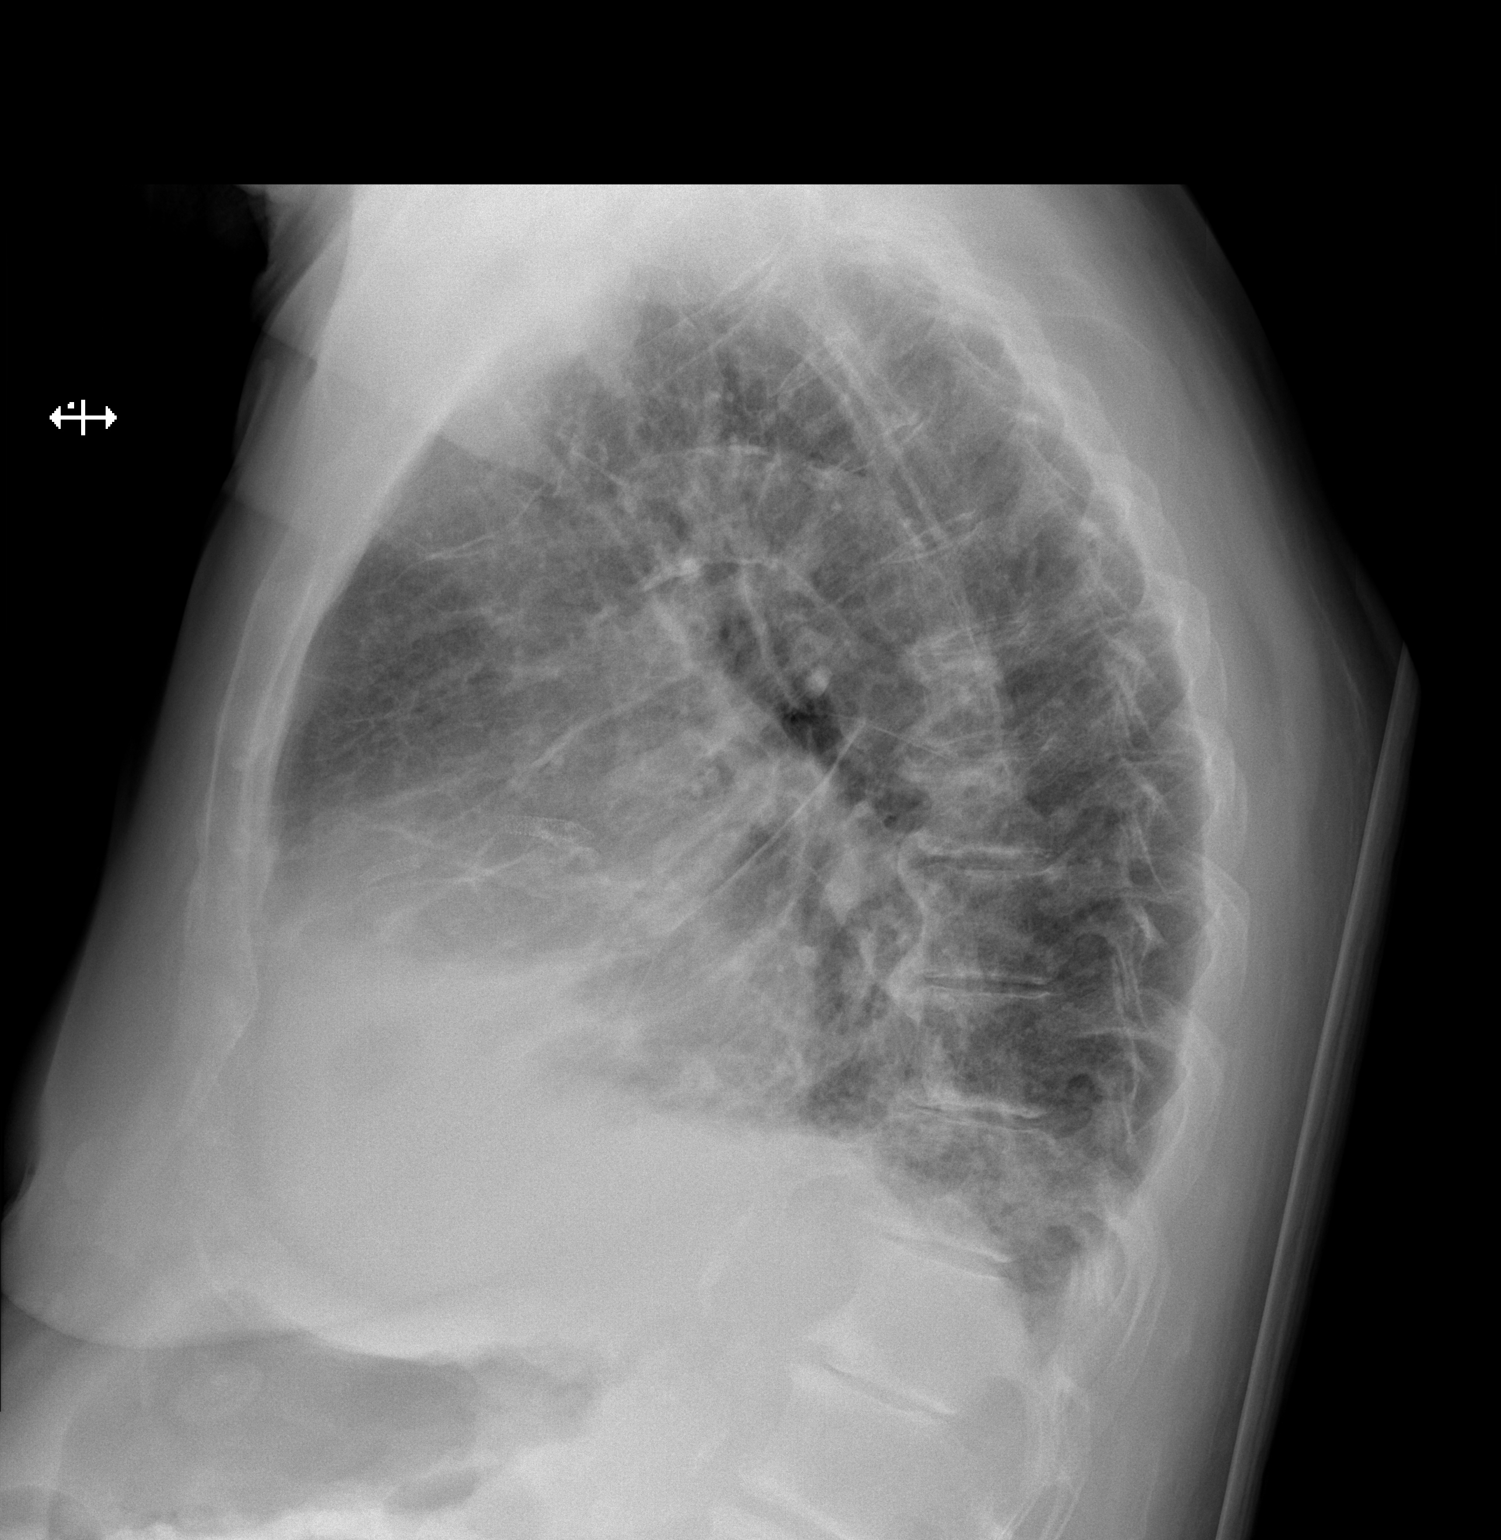
[im 2/2]
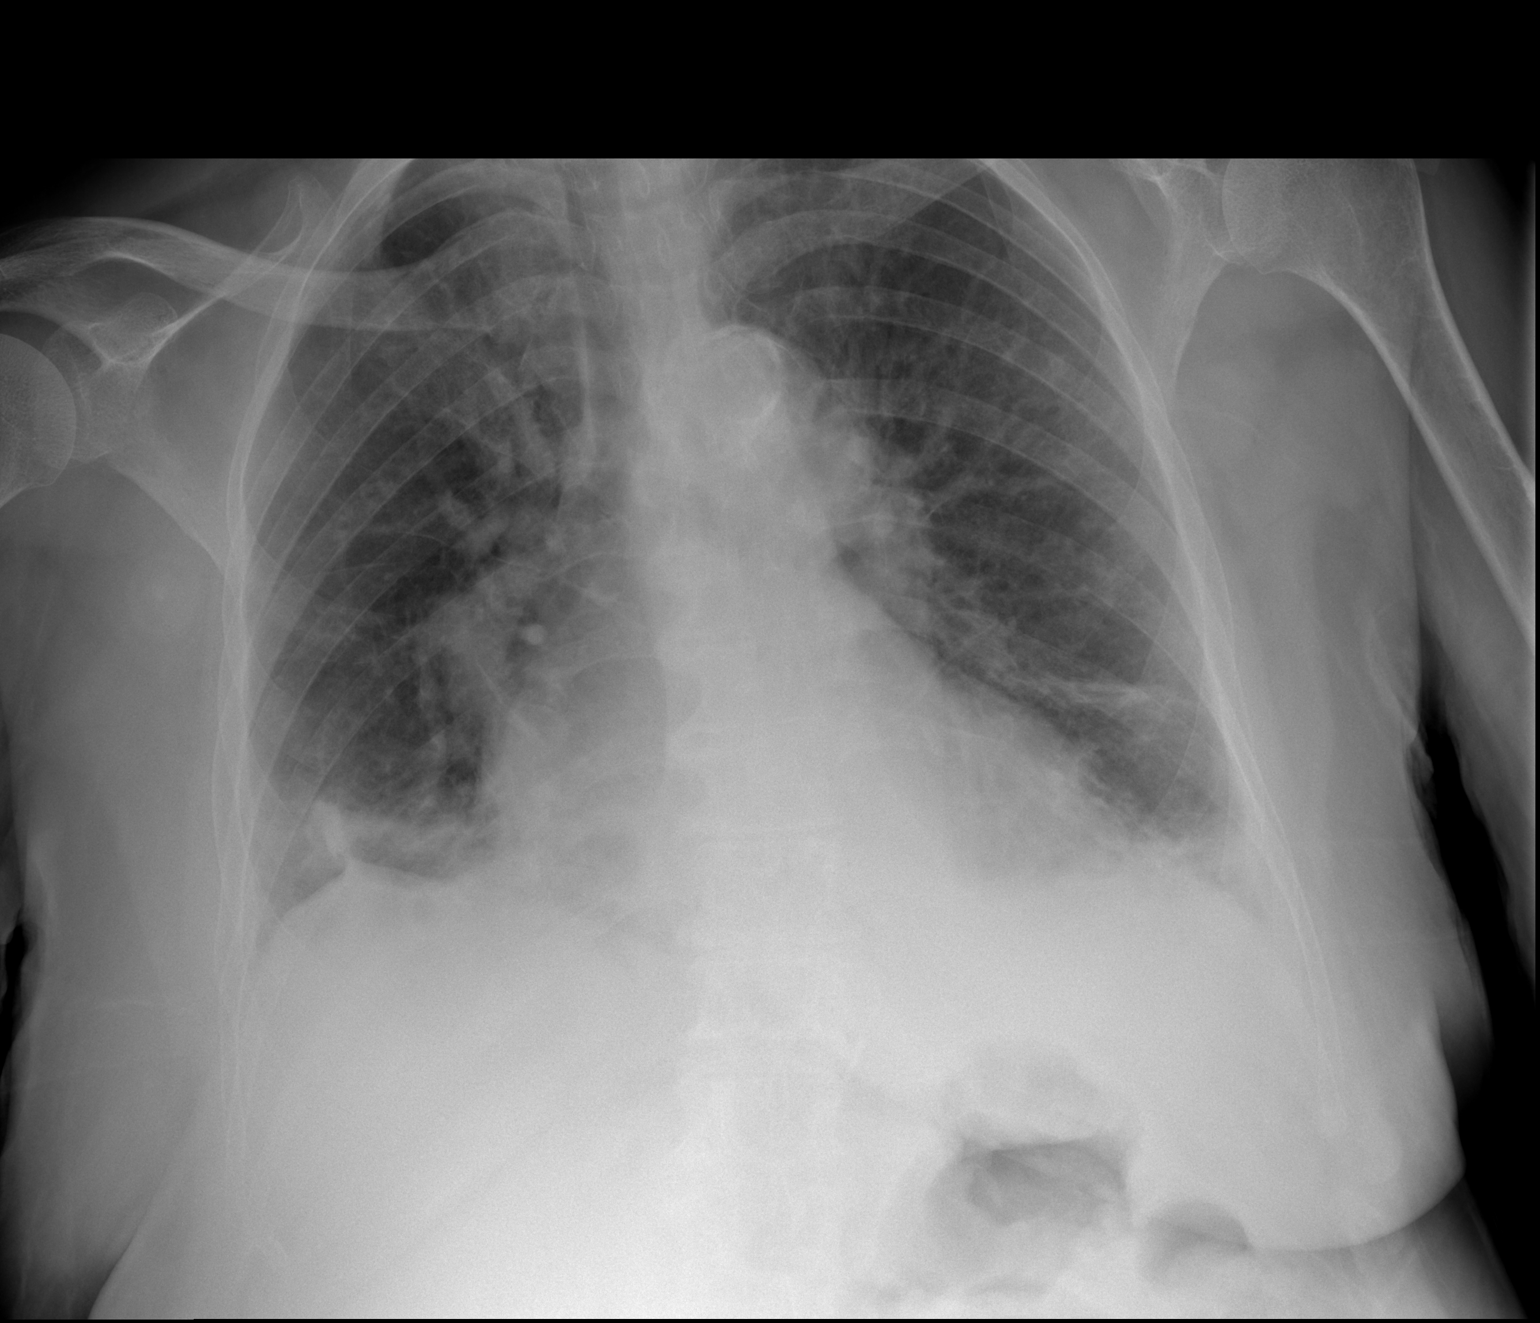

[2 of 2 positions shown; findings below may reference images not displayed]

FINDINGS: The heart is enlarged. The upper mediastinal contours are within
normal limits.

There are patchy opacities in the lung bases. There is vascular
congestion without definite overt pulmonary edema. There is no
pneumothorax. There is no pleural effusion.

There is no acute osseous abnormality.
IMPRESSION: 1. Mild cardiomegaly with vascular congestion but no definite overt
pulmonary edema.
2. Patchy opacities in the lung bases may reflect atelectasis or
infection.

## 2021-11-03 MED ORDER — ASPIRIN 81 MG PO CHEW
324.0000 mg | CHEWABLE_TABLET | Freq: Once | ORAL | Status: AC
  Administered 2021-11-03: 324 mg via ORAL
  Filled 2021-11-03: qty 4

## 2021-11-03 MED ORDER — FUROSEMIDE 10 MG/ML IJ SOLN
40.0000 mg | Freq: Once | INTRAMUSCULAR | Status: AC
  Administered 2021-11-03: 40 mg via INTRAVENOUS
  Filled 2021-11-03: qty 4

## 2021-11-03 NOTE — ED Notes (Signed)
No answer for repeat vitals

## 2021-11-03 NOTE — ED Provider Notes (Signed)
Emergency Medicine Provider Triage Evaluation Note  Melanie Ray , a 70 y.o. female  was evaluated in triage.  Pt complains of left arm pain and numbness with dizziness and shortness of breath for the past week. Has used NTG and inhaler without relief..  Review of Systems  Positive: Dizziness, shortness of breath, and left arm pain. Negative: Fever, nausea, vomiting  Physical Exam  There were no vitals taken for this visit. Gen:   Awake, no distress   Resp:  Normal effort  MSK:   Moves extremities without difficulty  Other:    Medical Decision Making  Medically screening exam initiated at 11:27 AM.  Appropriate orders placed.  Laurel Harnden was informed that the remainder of the evaluation will be completed by another provider, this initial triage assessment does not replace that evaluation, and the importance of remaining in the ED until their evaluation is complete.   Victorino Dike, FNP 11/03/21 1130    Harvest Dark, MD 11/03/21 [phone]

## 2021-11-03 NOTE — ED Notes (Signed)
Pt reports is  going to take of her dog and will be back [phone]

## 2021-11-03 NOTE — ED Provider Notes (Signed)
[facility] Emergency Department Provider Note  ____________________________________________   Event Date/Time   First MD Initiated Contact with Patient 11/03/21 2302     (approximate)  I have reviewed the triage vital signs and the nursing notes.   HISTORY  Chief Complaint Dizziness and Shortness of Breath    HPI Melanie Ray is a 70 y.o. female with history of CAD, CHF, hypertension, hyperlipidemia, diabetes who presents to the emergency department complaints of shortness of breath worse with exertion for the past week.  Has also felt weak, lightheaded and has had some chest pressure.  Also reports a dry cough and wheezing but no fevers or chills.  States she takes furosemide as needed when she notices swelling in her legs.  She states over the last couple days her legs have been swollen but she has not been taking her furosemide regularly.          Past Medical History:  Diagnosis Date   Anemia    Anxiety    Back pain    CAD (coronary artery disease) 04/11/2016   S/p ant STEMI 5/17: LHC >> LAD proximal 80%, mid 80%, distal 50%, ostial D1 60%; LCx with LPDA lesion 30%; RCA Mild calcification with no significant stenosis in a medium caliber, nondominant RCA; LVEF is estimated at 45% with inferoapical and lateral wall akinesis >> PCI: PCI: 3.5 x 24 mm Promus DES to prox LAD, 2.5 x 12 mm Promus DES to mid LAD. // Myoview 7/21: EF 20, no ischemia; high risk    Chest pain    Chronic systolic CHF (congestive heart failure) ([place]) 03/21/2016   Echo 01/30/17: Diff HK, mild focal basal septal hypertrophy, EF 30-35, mild AI, MAC, mild MR // Echo 06/08/16: Mild focal basal septal hypertrophy, EF 25-30%, diff HK, ant-septal AK, Gr 1 DD, mild AI, MAC, mild MR, PASP 37 mmHg // Echo 03/18/16: EF 30-35%, ant-septal AK, Gr 1 DD, mild MR, severe LAE.   Constipation    Depression    Diabetes mellitus type 2 in obese (HCC)    Dizziness    Glaucoma    Gout    Heart  disease    Heartburn    History of acute anterior wall MI 03/17/2016   History of heart attack    Hyperlipemia    Hypertension    Ischemic cardiomyopathy 10/02/2016   Refused ICD   Joint pain    Left bundle branch block    Myocardial infarction ([place])    Nausea    Positive colorectal cancer screening using Cologuard test 03/01/2020   Sleep apnea    SOB (shortness of breath)    Suicidal ideation 08/27/2018   Swelling    feet or legs    Patient Active Problem List   Diagnosis Date Noted   Acute on chronic systolic CHF (congestive heart failure) ([place]) 11/04/2021   Anemia, unspecified 11/02/2021   Dyspnea 04/21/2021   Generalized weakness 04/21/2021   Palpitations 09/14/2020   Memory difficulty 06/28/2020   Cerebellar stroke, acute ([place]) 03/30/2020   Nightmares 03/01/2020   Left bundle branch block 12/03/2018   QT prolongation 12/03/2018   Obsessive compulsive disorder 08/27/2018   Hot flash, menopausal 02/07/2018   Vitamin D deficiency 06/17/2017   Class 1 obesity without serious comorbidity with body mass index (BMI) of 30.0 to 30.9 in adult 06/17/2017   Dizziness 01/03/2017   Ischemic cardiomyopathy 10/02/2016   CAD (coronary artery disease) 16/05/3709   Chronic systolic CHF (congestive heart failure) (  [place]) 03/21/2016   History of acute anterior wall MI 03/18/2016   DM type 2 (diabetes mellitus, type 2) ([place]) 08/03/2015   Essential hypertension 07/28/2015   HLD (hyperlipidemia) 07/28/2015   Glaucoma 07/28/2015   Depression 07/28/2015   Anxiety 07/28/2015   Insomnia 07/28/2015   Overweight (BMI 25.0-29.9) 07/28/2015   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   Uterine leiomyoma 04/27/2008   Generalized anxiety disorder 04/27/2008   Osteopenia 04/27/2008   Other specified disorder of bladder 04/27/2008    Past Surgical History:  Procedure Laterality Date   APPENDECTOMY     CARDIAC CATHETERIZATION N/A 03/17/2016   Procedure: Left Heart Cath and Coronary  Angiography;  Surgeon: Sherren Mocha, MD;  Location: [place] [facility];  Service: Cardiovascular;  Laterality: N/A;   CARDIAC CATHETERIZATION N/A 03/17/2016   Procedure: Coronary Stent Intervention;  Surgeon: Sherren Mocha, MD;  Location: [place] [facility];  Service: Cardiovascular;  Laterality: N/A;   COLONOSCOPY WITH PROPOFOL N/A 08/13/2017   Procedure: COLONOSCOPY WITH PROPOFOL;  Surgeon: Jonathon Bellows, MD;  Location: [facility] ENDOSCOPY;  Service: Gastroenterology;  Laterality: N/A;   COLONOSCOPY WITH PROPOFOL N/A 03/29/2020   Procedure: COLONOSCOPY WITH PROPOFOL;  Surgeon: Lucilla Lame, MD;  Location: [facility]COPY;  Service: Endoscopy;  Laterality: N/A;   SMALL BOWEL REPAIR     TONSILLECTOMY     UTERINE FIBROID SURGERY      Prior to Admission medications   Medication Sig Start Date End Date Taking? Authorizing Provider  atorvastatin (LIPITOR) 80 MG tablet Take 1 tablet by mouth once daily 06/13/21  Yes Bensimhon, Shaune Pascal, MD  carvedilol (COREG) 12.5 MG tablet TAKE 1 TABLET BY MOUTH TWICE DAILY WITH MEALS 06/05/21  Yes Sherren Mocha, MD  clopidogrel (PLAVIX) 75 MG tablet Take 1 tablet by mouth once daily 10/03/21  Yes Leone Haven, MD  dorzolamide-timolol (COSOPT) 22.3-6.8 MG/ML ophthalmic solution Place 1 drop into both eyes 2 (two) times daily.  01/17/15  Yes [provider]  furosemide (LASIX) 40 MG tablet Take 1 tablet (40 mg total) by mouth daily. 03/02/21  Yes Bensimhon, Shaune Pascal, MD  isosorbide mononitrate (IMDUR) 30 MG 24 hr tablet Take 1 tablet (30 mg total) by mouth daily. 02/27/21  Yes Sherren Mocha, MD  latanoprost (XALATAN) 0.005 % ophthalmic solution Place 1 drop into both eyes at bedtime. 03/15/20  Yes [provider]  LORazepam (ATIVAN) 0.5 MG tablet Take 0.5 mg by mouth daily as needed. 10/23/21  Yes [provider]  lurasidone (LATUDA) 40 MG TABS tablet Take 40 mg by mouth daily with breakfast.   Yes [provider]  spironolactone  (ALDACTONE) 25 MG tablet Take half a tablet by mouth once a day 02/27/21  Yes Sherren Mocha, MD  traZODone (DESYREL) 150 MG tablet Take 150 mg by mouth at bedtime. 10/23/21  Yes [provider]  valsartan (DIOVAN) 80 MG tablet Take 1 tablet (80 mg total) by mouth 2 (two) times daily. 02/27/21  Yes Bensimhon, Shaune Pascal, MD  venlafaxine XR (EFFEXOR-XR) 150 MG 24 hr capsule TAKE 2 CAPSULES BY MOUTH ONCE DAILY (NEEDS to follow-up with psychiatry for further refills) 11/02/20  Yes Leone Haven, MD  nitroGLYCERIN (NITROSTAT) 0.4 MG SL tablet DISSOLVE ONE TABLET UNDER THE TONGUE EVERY 5 MINUTES AS NEEDED FOR CHEST PAIN.  DO NOT EXCEED A TOTAL OF 3 DOSES IN 15 MINUTES 06/01/20   Sherren Mocha, MD    Allergies Tetracycline, Meperidine, and Entresto [sacubitril-valsartan]  Family History  Problem Relation Age of Onset  Anxiety disorder Mother    Paranoid behavior Mother    Hypertension Mother    Dementia Mother    High Cholesterol Mother    Diabetes Mother    Hyperlipidemia Mother    Depression Mother    Hypertension Father    High Cholesterol Father    Mood Disorder Sister    Stroke Sister    Anxiety disorder Maternal Aunt    Tuberculosis Paternal Grandfather    Drug abuse Cousin     Social History Social History   Tobacco Use   Smoking status: Former    Types: Cigarettes    Quit date: 04/13/1997    Years since quitting: 24.5   Smokeless tobacco: Never  Vaping Use   Vaping Use: Never used  Substance Use Topics   Alcohol use: Not Currently   Drug use: No    Review of Systems Constitutional: No fever. Eyes: No visual changes. ENT: No sore throat. Cardiovascular: +chest pain. Respiratory: + shortness of breath. Gastrointestinal: No nausea, vomiting, diarrhea. Genitourinary: Negative for dysuria. Musculoskeletal: Negative for back pain. Skin: Negative for rash. Neurological: Negative for focal weakness or  numbness.  ____________________________________________   PHYSICAL EXAM:  VITAL SIGNS: ED Triage Vitals  Enc Vitals Group     BP 11/03/21 1129 139/74     Pulse Rate 11/03/21 1129 78     Resp 11/03/21 1129 18     Temp 11/03/21 1129 99 F (37.2 C)     Temp Source 11/03/21 1628 Oral     SpO2 11/03/21 1129 92 %     Weight --      Height --      Head Circumference --      Peak Flow --      Pain Score 11/03/21 1127 8     Pain Loc --      Pain Edu? --      Excl. in [place]? --    CONSTITUTIONAL: Alert and oriented and responds appropriately to questions. Well-appearing; well-nourished HEAD: Normocephalic EYES: Conjunctivae clear, pupils appear equal, EOM appear intact ENT: normal nose; moist mucous membranes NECK: Supple, normal ROM CARD: RRR; S1 and S2 appreciated; no murmurs, no clicks, no rubs, no gallops RESP: Normal chest excursion without splinting or tachypnea; breath sounds clear and equal bilaterally; no wheezes, no rhonchi, no rales, no hypoxia or respiratory distress, speaking full sentences ABD/GI: Normal bowel sounds; non-distended; soft, non-tender, no rebound, no guarding, no peritoneal signs, no hepatosplenomegaly BACK: The back appears normal EXT: Normal ROM in all joints; no deformity noted, mild amount of nonpitting edema in bilateral distal lower extremities, no calf tenderness or calf swelling SKIN: Normal color for age and race; warm; no rash on exposed skin NEURO: Moves all extremities equally PSYCH: The patient's mood and manner are appropriate.  ____________________________________________   LABS (all labs ordered are listed, but only abnormal results are displayed)  Labs Reviewed  CBC - Abnormal; Notable for the following components:      Result Value   Hemoglobin 10.7 (*)    HCT 33.4 (*)    All other components within normal limits  COMPREHENSIVE METABOLIC PANEL - Abnormal; Notable for the following components:   Glucose, Bld 110 (*)    Total  Protein 6.2 (*)    AST 49 (*)    ALT 91 (*)    Alkaline Phosphatase 143 (*)    Anion gap 4 (*)    All other components within normal limits  BRAIN NATRIURETIC PEPTIDE - Abnormal; Notable  for the following components:   B Natriuretic Peptide 2,040.1 (*)    All other components within normal limits  TROPONIN I (HIGH SENSITIVITY) - Abnormal; Notable for the following components:   Troponin I (High Sensitivity) 43 (*)    All other components within normal limits  TROPONIN I (HIGH SENSITIVITY) - Abnormal; Notable for the following components:   Troponin I (High Sensitivity) 51 (*)    All other components within normal limits  RESP PANEL BY RT-PCR (FLU A&B, COVID) [id number]  PROCALCITONIN   ____________________________________________  EKG   EKG Interpretation  Date/Time:  Friday November 03 2021 11:24:38 EST Ventricular Rate:  80 PR Interval:  148 QRS Duration: 152 QT Interval:  444 QTC Calculation: 512 R Axis:   71 Text Interpretation: Normal sinus rhythm Left bundle branch block Abnormal ECG When compared with ECG of 24-Apr-2021 11:37, Questionable change in QRS axis T wave inversion more evident in Inferior leads Confirmed by UNCONFIRMED, DOCTOR ([id number]), editor Mel Almond, Lynelle Smoke [phone]) on 11/03/2021 11:40:56 AM        ____________________________________________  RADIOLOGY Jessie Foot Elaura Calix, personally viewed and evaluated these images (plain radiographs) as part of my medical decision making, as well as reviewing the written report by the radiologist.  ED MD interpretation: Chest x-ray shows cardiomegaly, bibasilar opacities that could be edema versus infection.  Official radiology report(s): DG Chest 2 View  Result Date: 11/03/2021 CLINICAL DATA:  Dizziness, shortness of breath EXAM: CHEST - 2 VIEW COMPARISON:  Chest radiograph 04/21/2021 FINDINGS: The heart is enlarged. The upper mediastinal contours are within normal limits. There are patchy opacities in the lung bases.  There is vascular congestion without definite overt pulmonary edema. There is no pneumothorax. There is no pleural effusion. There is no acute osseous abnormality. IMPRESSION: 1. Mild cardiomegaly with vascular congestion but no definite overt pulmonary edema. 2. Patchy opacities in the lung bases may reflect atelectasis or infection. Electronically Signed   By: Valetta Mole M.D.   On: 11/03/2021 12:08    ____________________________________________   PROCEDURES  Procedure(s) performed (including Critical Care):  Procedures  ____________________________________________   INITIAL IMPRESSION / ASSESSMENT AND PLAN / ED COURSE  As part of my medical decision making, I reviewed the following data within the [place] notes reviewed and incorporated, Labs reviewed , EKG interpreted , Old EKG reviewed, Old chart reviewed, Radiograph reviewed , Discussed with admitting physician , and Notes from prior ED visits         Patient here with shortness of breath worse with exertion, weakness and dizziness.  Differential includes CHF exacerbation, ACS, PE, dissection, pneumonia, COVID, influenza.  Chest x-ray shows no pneumothorax.  Troponins minimally elevated today.  BNP is significantly elevated and chest x-ray shows bibasilar opacities that could represent edema.  Will give IV Lasix.  She also reports a cough and has an oral temperature here of 99.8.  We will check a rectal temperature.  If febrile, will also cover for pneumonia.  COVID and flu swabs pending.  Discussed with patient that I feel she will need admission.  ED PROGRESS   Rectal temperature is normal.  Suspect this is less likely pneumonia.  We will add on a procalcitonin.   12:15 AM Discussed patient's case with hospitalist, Dr. Hal Hope.  I have recommended admission and patient (and family if present) agree with this plan. Admitting physician will place admission orders.   I reviewed all nursing notes,  vitals, pertinent previous records and reviewed/interpreted all EKGs, lab  and urine results, imaging (as available).  ____________________________________________   FINAL CLINICAL IMPRESSION(S) / ED DIAGNOSES  Final diagnoses:  Acute on chronic systolic congestive heart failure [facility])     ED Discharge Orders     None       *Please note:  Melanie Ray was evaluated in Emergency Department on 11/04/2021 for the symptoms described in the history of present illness. She was evaluated in the context of the global COVID-19 pandemic, which necessitated consideration that the patient might be at risk for infection with the SARS-CoV-2 virus that causes COVID-19. Institutional protocols and algorithms that pertain to the evaluation of patients at risk for COVID-19 are in a state of rapid change based on information released by regulatory bodies including the CDC and federal and state organizations. These policies and algorithms were followed during the patient's care in the ED.  Some ED evaluations and interventions may be delayed as a result of limited staffing during and the pandemic.*   Note:  This document was prepared using Dragon voice recognition software and may include unintentional dictation errors.    Janesa Dockery, Delice Bison, DO 11/04/21 [phone]

## 2021-11-03 NOTE — ED Triage Notes (Signed)
Pt comes with c/o dizziness, SOB, cough and left arm pain and numbness. Pt states this started week ago and has gotten worse. Pt denies any CP.  Pt denies any N/V/D

## 2021-11-03 NOTE — ED Notes (Signed)
Pt placed on purewick 

## 2021-11-04 ENCOUNTER — Encounter: Payer: Self-pay | Admitting: Internal Medicine

## 2021-11-04 DIAGNOSIS — I509 Heart failure, unspecified: Secondary | ICD-10-CM

## 2021-11-04 DIAGNOSIS — I5023 Acute on chronic systolic (congestive) heart failure: Secondary | ICD-10-CM | POA: Diagnosis not present

## 2021-11-04 LAB — CBG MONITORING, ED: Glucose-Capillary: 72 mg/dL (ref 70–99)

## 2021-11-04 LAB — RESP PANEL BY RT-PCR (FLU A&B, COVID) ARPGX2
Influenza A by PCR: NEGATIVE
Influenza B by PCR: NEGATIVE
SARS Coronavirus 2 by RT PCR: NEGATIVE

## 2021-11-04 MED ORDER — FUROSEMIDE 10 MG/ML IJ SOLN
40.0000 mg | Freq: Two times a day (BID) | INTRAMUSCULAR | Status: DC
  Administered 2021-11-04: 40 mg via INTRAVENOUS
  Filled 2021-11-04: qty 4

## 2021-11-04 MED ORDER — VENLAFAXINE HCL ER 150 MG PO CP24
300.0000 mg | ORAL_CAPSULE | Freq: Every day | ORAL | Status: DC
  Filled 2021-11-04: qty 2

## 2021-11-04 MED ORDER — POTASSIUM CHLORIDE CRYS ER 20 MEQ PO TBCR
20.0000 meq | EXTENDED_RELEASE_TABLET | Freq: Once | ORAL | Status: AC
  Administered 2021-11-04: 20 meq via ORAL
  Filled 2021-11-04: qty 1

## 2021-11-04 MED ORDER — SPIRONOLACTONE 25 MG PO TABS
25.0000 mg | ORAL_TABLET | Freq: Every day | ORAL | Status: DC
  Administered 2021-11-04: 25 mg via ORAL
  Filled 2021-11-04: qty 1

## 2021-11-04 MED ORDER — ENOXAPARIN SODIUM 40 MG/0.4ML IJ SOSY
40.0000 mg | PREFILLED_SYRINGE | INTRAMUSCULAR | Status: DC
  Filled 2021-11-04: qty 0.4

## 2021-11-04 MED ORDER — INSULIN ASPART 100 UNIT/ML IJ SOLN: 0.0000 [IU] | Freq: Three times a day (TID) | INTRAMUSCULAR | Status: DC

## 2021-11-04 MED ORDER — ALBUTEROL SULFATE (2.5 MG/3ML) 0.083% IN NEBU
2.5000 mg | INHALATION_SOLUTION | Freq: Four times a day (QID) | RESPIRATORY_TRACT | Status: DC
  Administered 2021-11-04: 2.5 mg via RESPIRATORY_TRACT
  Filled 2021-11-04: qty 3

## 2021-11-04 MED ORDER — POTASSIUM CHLORIDE CRYS ER 20 MEQ PO TBCR: 40.0000 meq | EXTENDED_RELEASE_TABLET | Freq: Once | ORAL | Status: DC

## 2021-11-04 MED ORDER — LURASIDONE HCL 40 MG PO TABS
40.0000 mg | ORAL_TABLET | Freq: Every day | ORAL | Status: DC
  Administered 2021-11-04: 40 mg via ORAL
  Filled 2021-11-04: qty 1

## 2021-11-04 MED ORDER — IRBESARTAN 75 MG PO TABS
75.0000 mg | ORAL_TABLET | Freq: Every day | ORAL | Status: DC
  Filled 2021-11-04: qty 1

## 2021-11-04 MED ORDER — ACETAMINOPHEN 325 MG RE SUPP: 650.0000 mg | Freq: Four times a day (QID) | RECTAL | Status: DC | PRN

## 2021-11-04 MED ORDER — TRAZODONE HCL 50 MG PO TABS: 150.0000 mg | ORAL_TABLET | Freq: Every day | ORAL | Status: DC

## 2021-11-04 MED ORDER — DORZOLAMIDE HCL-TIMOLOL MAL 2-0.5 % OP SOLN
1.0000 [drp] | Freq: Two times a day (BID) | OPHTHALMIC | Status: DC
  Filled 2021-11-04: qty 10

## 2021-11-04 MED ORDER — LORAZEPAM 0.5 MG PO TABS: 0.5000 mg | ORAL_TABLET | Freq: Every day | ORAL | Status: DC | PRN

## 2021-11-04 MED ORDER — CARVEDILOL 6.25 MG PO TABS
12.5000 mg | ORAL_TABLET | Freq: Two times a day (BID) | ORAL | Status: DC
  Administered 2021-11-04: 12.5 mg via ORAL
  Filled 2021-11-04: qty 2

## 2021-11-04 MED ORDER — ALBUTEROL SULFATE (2.5 MG/3ML) 0.083% IN NEBU: 2.5000 mg | INHALATION_SOLUTION | RESPIRATORY_TRACT | Status: DC | PRN

## 2021-11-04 MED ORDER — LATANOPROST 0.005 % OP SOLN
1.0000 [drp] | Freq: Every day | OPHTHALMIC | Status: DC
  Filled 2021-11-04: qty 2.5

## 2021-11-04 MED ORDER — NITROGLYCERIN 0.4 MG SL SUBL: 0.4000 mg | SUBLINGUAL_TABLET | SUBLINGUAL | Status: DC | PRN

## 2021-11-04 MED ORDER — ACETAMINOPHEN 325 MG PO TABS: 650.0000 mg | ORAL_TABLET | Freq: Four times a day (QID) | ORAL | Status: DC | PRN

## 2021-11-04 MED ORDER — CLOPIDOGREL BISULFATE 75 MG PO TABS
75.0000 mg | ORAL_TABLET | Freq: Every day | ORAL | Status: DC
  Administered 2021-11-04: 75 mg via ORAL
  Filled 2021-11-04: qty 1

## 2021-11-04 MED ORDER — FUROSEMIDE 40 MG PO TABS: 40.0000 mg | ORAL_TABLET | Freq: Two times a day (BID) | ORAL | 0 refills | Status: DC

## 2021-11-04 MED ORDER — ATORVASTATIN CALCIUM 20 MG PO TABS: 80.0000 mg | ORAL_TABLET | Freq: Every day | ORAL | Status: DC

## 2021-11-04 MED ORDER — FUROSEMIDE 40 MG PO TABS: 40.0000 mg | ORAL_TABLET | Freq: Every day | ORAL | 5 refills | Status: DC

## 2021-11-04 MED ORDER — BUDESONIDE 0.25 MG/2ML IN SUSP
0.2500 mg | Freq: Two times a day (BID) | RESPIRATORY_TRACT | Status: DC
  Administered 2021-11-04: 0.25 mg via RESPIRATORY_TRACT
  Filled 2021-11-04: qty 2

## 2021-11-04 MED ORDER — ISOSORBIDE MONONITRATE ER 60 MG PO TB24
30.0000 mg | ORAL_TABLET | Freq: Every day | ORAL | Status: DC
  Administered 2021-11-04: 30 mg via ORAL
  Filled 2021-11-04: qty 1

## 2021-11-04 NOTE — ED Notes (Signed)
Pt walked with no assistance around room for walking pulse ox. O2 saturation did not drop. MD made aware.

## 2021-11-04 NOTE — ED Notes (Signed)
Pt resting comfortably in bed at this time. Pt is alert and oriented with even and regular respirations. No acute distress noted. Pt denies any needs at this time. Call light within reach.  °

## 2021-11-04 NOTE — Discharge Summary (Signed)
Physician Discharge Summary  Melanie Ray PFX:[id number] DOB: [birth year] DOA: 11/03/2021  PCP: Leone Haven, MD  Admit date: 11/03/2021 Discharge date: 11/04/2021  Admitted From: Home Disposition: Home  Recommendations for Outpatient Follow-up:  Follow up with PCP in 1-2 weeks Please obtain BMP/CBC in one week Please follow up with cardiology as discussed  Home Health: None Equipment/Devices: None  Discharge Condition: Stable CODE STATUS: Full Diet recommendation: Low-salt low-fat fluid restricted diet as discussed  Brief/Interim Summary: Melanie Ray is a 70 y.o. female with history of CAD status post stenting, CVA, chronic systolic heart failure, diabetes mellitus presents to the ER because of increasing shortness of breath.  Patient was admitted with presumed heart failure exacerbation, she diuresed quite well and aggressively overnight.  Ambulating today without any hypoxia or dyspnea on exertion.  Patient had family emergency with husband and pets, after lengthy discussion with patient about her condition given her rapid improvement with IV Lasix and no longer hypoxic she was otherwise stable and agreeable for discharge home with increased dose of p.o. Lasix to follow closely with PCP and cardiology in the next 1 to 2 weeks as scheduled.  We discussed patient's potential infectious process but given patient remained afebrile without leukocytosis likely her wheeze was cardiogenic at intake in the setting of heart failure and antibiotics were discontinued.  Increased furosemide at discharge but otherwise no medication changes at the time of discharge.  Discharge Instructions   Allergies as of 11/04/2021       Reactions   Tetracycline Swelling   Meperidine Nausea And Vomiting   Nausea   Entresto [sacubitril-valsartan] Other (See Comments)   Shortness of Breath        Medication List     TAKE these medications    atorvastatin 80 MG  tablet Commonly known as: LIPITOR Take 1 tablet by mouth once daily   carvedilol 12.5 MG tablet Commonly known as: COREG TAKE 1 TABLET BY MOUTH TWICE DAILY WITH MEALS   clopidogrel 75 MG tablet Commonly known as: PLAVIX Take 1 tablet by mouth once daily   dorzolamide-timolol 22.3-6.8 MG/ML ophthalmic solution Commonly known as: COSOPT Place 1 drop into both eyes 2 (two) times daily.   furosemide 40 MG tablet Commonly known as: LASIX Take 1 tablet (40 mg total) by mouth 2 (two) times daily. Take twice daily for one week then return to once daily dosing as previously prescribed What changed:  when to take this additional instructions   furosemide 40 MG tablet Commonly known as: Lasix Take 1 tablet (40 mg total) by mouth daily. Start taking on: November 11, 2021 What changed: You were already taking a medication with the same name, and this prescription was added. Make sure you understand how and when to take each.   isosorbide mononitrate 30 MG 24 hr tablet Commonly known as: IMDUR Take 1 tablet (30 mg total) by mouth daily.   latanoprost 0.005 % ophthalmic solution Commonly known as: XALATAN Place 1 drop into both eyes at bedtime.   LORazepam 0.5 MG tablet Commonly known as: ATIVAN Take 0.5 mg by mouth daily as needed.   lurasidone 40 MG Tabs tablet Commonly known as: LATUDA Take 40 mg by mouth daily with breakfast.   nitroGLYCERIN 0.4 MG SL tablet Commonly known as: NITROSTAT DISSOLVE ONE TABLET UNDER THE TONGUE EVERY 5 MINUTES AS NEEDED FOR CHEST PAIN.  DO NOT EXCEED A TOTAL OF 3 DOSES IN 15 MINUTES   spironolactone 25 MG tablet Commonly known as:  ALDACTONE Take half a tablet by mouth once a day   traZODone 150 MG tablet Commonly known as: DESYREL Take 150 mg by mouth at bedtime.   valsartan 80 MG tablet Commonly known as: DIOVAN Take 1 tablet (80 mg total) by mouth 2 (two) times daily.   venlafaxine XR 150 MG 24 hr capsule Commonly known as:  EFFEXOR-XR TAKE 2 CAPSULES BY MOUTH ONCE DAILY (NEEDS to follow-up with psychiatry for further refills)        Allergies  Allergen Reactions   Tetracycline Swelling   Meperidine Nausea And Vomiting    Nausea   Entresto [Sacubitril-Valsartan] Other (See Comments)    Shortness of Breath    Consultations: None  Procedures/Studies: DG Chest 2 View  Result Date: 11/03/2021 CLINICAL DATA:  Dizziness, shortness of breath EXAM: CHEST - 2 VIEW COMPARISON:  Chest radiograph 04/21/2021 FINDINGS: The heart is enlarged. The upper mediastinal contours are within normal limits. There are patchy opacities in the lung bases. There is vascular congestion without definite overt pulmonary edema. There is no pneumothorax. There is no pleural effusion. There is no acute osseous abnormality. IMPRESSION: 1. Mild cardiomegaly with vascular congestion but no definite overt pulmonary edema. 2. Patchy opacities in the lung bases may reflect atelectasis or infection. Electronically Signed   By: Valetta Mole M.D.   On: 11/03/2021 12:08     Subjective: No acute issues or events overnight, denies chest pain shortness of breath nausea vomiting diarrhea constipation headache fevers or chills   Discharge Exam: Vitals:   11/04/21 1000 11/04/21 1018  BP: (!) 142/68   Pulse: 95 90  Resp: (!) 32 20  Temp:  98 F (36.7 C)  SpO2: 96%    Vitals:   11/04/21 0900 11/04/21 0930 11/04/21 1000 11/04/21 1018  BP: (!) 136/51 (!) 148/63 (!) 142/68   Pulse: 84 77 95 90  Resp: (!) 26 17 (!) 32 20  Temp:    98 F (36.7 C)  TempSrc:    Oral  SpO2: 93% 91% 96%     General: Pt is alert, awake, not in acute distress Cardiovascular: RRR, S1/S2 +, no rubs, no gallops Respiratory: CTA bilaterally, no wheezing, no rhonchi Abdominal: Soft, NT, ND, bowel sounds + Extremities: no edema, no cyanosis    The results of significant diagnostics from this hospitalization (including imaging, microbiology, ancillary and  laboratory) are listed below for reference.     Microbiology: Recent Results (from the past 240 hour(s))  Resp Panel by RT-PCR (Flu A&B, Covid) Nasopharyngeal Swab     Status: None   Collection Time: 11/03/21 11:39 PM   Specimen: Nasopharyngeal Swab; Nasopharyngeal(NP) swabs in vial transport medium  Result Value Ref Range Status   SARS Coronavirus 2 by RT PCR NEGATIVE NEGATIVE Final    Comment: (NOTE) SARS-CoV-2 target nucleic acids are NOT DETECTED.  The SARS-CoV-2 RNA is generally detectable in upper respiratory specimens during the acute phase of infection. The lowest concentration of SARS-CoV-2 viral copies this assay can detect is 138 copies/mL. A negative result does not preclude SARS-Cov-2 infection and should not be used as the sole basis for treatment or other patient management decisions. A negative result may occur with  improper specimen collection/handling, submission of specimen other than nasopharyngeal swab, presence of viral mutation(s) within the areas targeted by this assay, and inadequate number of viral copies(<138 copies/mL). A negative result must be combined with clinical observations, patient history, and epidemiological information. The expected result is Negative.  Fact Sheet for  Patients:  EntrepreneurPulse.com.au  Fact Sheet for Healthcare Providers:  IncredibleEmployment.be  This test is no t yet approved or cleared by the [place] FDA and  has been authorized for detection and/or diagnosis of SARS-CoV-2 by FDA under an Emergency Use Authorization (EUA). This EUA will remain  in effect (meaning this test can be used) for the duration of the COVID-19 declaration under Section 564(b)(1) of the Act, 21 U.S.C.section 360bbb-3(b)(1), unless the authorization is terminated  or revoked sooner.       Influenza A by PCR NEGATIVE NEGATIVE Final   Influenza B by PCR NEGATIVE NEGATIVE Final    Comment: (NOTE) The  Xpert Xpress SARS-CoV-2/FLU/RSV plus assay is intended as an aid in the diagnosis of influenza from Nasopharyngeal swab specimens and should not be used as a sole basis for treatment. Nasal washings and aspirates are unacceptable for Xpert Xpress SARS-CoV-2/FLU/RSV testing.  Fact Sheet for Patients: EntrepreneurPulse.com.au  Fact Sheet for Healthcare Providers: IncredibleEmployment.be  This test is not yet approved or cleared by the [place] FDA and has been authorized for detection and/or diagnosis of SARS-CoV-2 by FDA under an Emergency Use Authorization (EUA). This EUA will remain in effect (meaning this test can be used) for the duration of the COVID-19 declaration under Section 564(b)(1) of the Act, 21 U.S.C. section 360bbb-3(b)(1), unless the authorization is terminated or revoked.  Performed at [facility], [address] [place], [place] [address]      Labs: BNP (last 3 results) Recent Labs    04/21/21 1219 11/03/21 1130  BNP 345.6* 6,503.5*   Basic Metabolic Panel: Recent Labs  Lab 11/03/21 1130  NA 139  K 3.9  CL 111  CO2 24  GLUCOSE 110*  BUN 19  CREATININE 0.83  CALCIUM 9.3   Liver Function Tests: Recent Labs  Lab 11/03/21 1130  AST 49*  ALT 91*  ALKPHOS 143*  BILITOT 1.2  PROT 6.2*  ALBUMIN 3.5   No results for input(s): LIPASE, AMYLASE in the last 168 hours. No results for input(s): AMMONIA in the last 168 hours. CBC: Recent Labs  Lab 11/03/21 1130  WBC 7.1  HGB 10.7*  HCT 33.4*  MCV 85.4  PLT 254   Cardiac Enzymes: No results for input(s): CKTOTAL, CKMB, CKMBINDEX, TROPONINI in the last 168 hours. BNP: Invalid input(s): POCBNP CBG: Recent Labs  Lab 11/04/21 0741  GLUCAP 72   D-Dimer No results for input(s): DDIMER in the last 72 hours. Hgb A1c No results for input(s): HGBA1C in the last 72 hours. Lipid Profile No results for input(s): CHOL, HDL, LDLCALC, TRIG,  CHOLHDL, LDLDIRECT in the last 72 hours. Thyroid function studies No results for input(s): TSH, T4TOTAL, T3FREE, THYROIDAB in the last 72 hours.  Invalid input(s): FREET3 Anemia work up No results for input(s): VITAMINB12, FOLATE, FERRITIN, TIBC, IRON, RETICCTPCT in the last 72 hours. Urinalysis    Component Value Date/Time   COLORURINE YELLOW (A) 11/22/2016 1138   APPEARANCEUR CLEAR (A) 11/22/2016 1138   LABSPEC 1.019 11/22/2016 1138   PHURINE 5.0 11/22/2016 1138   GLUCOSEU NEGATIVE 11/22/2016 1138   HGBUR NEGATIVE 11/22/2016 1138   BILIRUBINUR NEGATIVE 11/22/2016 1138   KETONESUR NEGATIVE 11/22/2016 1138   PROTEINUR NEGATIVE 11/22/2016 1138   NITRITE NEGATIVE 11/22/2016 1138   LEUKOCYTESUR NEGATIVE 11/22/2016 1138   Sepsis Labs Invalid input(s): PROCALCITONIN,  WBC,  LACTICIDVEN Microbiology Recent Results (from the past 240 hour(s))  Resp Panel by RT-PCR (Flu A&B, Covid) Nasopharyngeal Swab     Status:  None   Collection Time: 11/03/21 11:39 PM   Specimen: Nasopharyngeal Swab; Nasopharyngeal(NP) swabs in vial transport medium  Result Value Ref Range Status   SARS Coronavirus 2 by RT PCR NEGATIVE NEGATIVE Final    Comment: (NOTE) SARS-CoV-2 target nucleic acids are NOT DETECTED.  The SARS-CoV-2 RNA is generally detectable in upper respiratory specimens during the acute phase of infection. The lowest concentration of SARS-CoV-2 viral copies this assay can detect is 138 copies/mL. A negative result does not preclude SARS-Cov-2 infection and should not be used as the sole basis for treatment or other patient management decisions. A negative result may occur with  improper specimen collection/handling, submission of specimen other than nasopharyngeal swab, presence of viral mutation(s) within the areas targeted by this assay, and inadequate number of viral copies(<138 copies/mL). A negative result must be combined with clinical observations, patient history, and  epidemiological information. The expected result is Negative.  Fact Sheet for Patients:  EntrepreneurPulse.com.au  Fact Sheet for Healthcare Providers:  IncredibleEmployment.be  This test is no t yet approved or cleared by the [place] FDA and  has been authorized for detection and/or diagnosis of SARS-CoV-2 by FDA under an Emergency Use Authorization (EUA). This EUA will remain  in effect (meaning this test can be used) for the duration of the COVID-19 declaration under Section 564(b)(1) of the Act, 21 U.S.C.section 360bbb-3(b)(1), unless the authorization is terminated  or revoked sooner.       Influenza A by PCR NEGATIVE NEGATIVE Final   Influenza B by PCR NEGATIVE NEGATIVE Final    Comment: (NOTE) The Xpert Xpress SARS-CoV-2/FLU/RSV plus assay is intended as an aid in the diagnosis of influenza from Nasopharyngeal swab specimens and should not be used as a sole basis for treatment. Nasal washings and aspirates are unacceptable for Xpert Xpress SARS-CoV-2/FLU/RSV testing.  Fact Sheet for Patients: EntrepreneurPulse.com.au  Fact Sheet for Healthcare Providers: IncredibleEmployment.be  This test is not yet approved or cleared by the [place] FDA and has been authorized for detection and/or diagnosis of SARS-CoV-2 by FDA under an Emergency Use Authorization (EUA). This EUA will remain in effect (meaning this test can be used) for the duration of the COVID-19 declaration under Section 564(b)(1) of the Act, 21 U.S.C. section 360bbb-3(b)(1), unless the authorization is terminated or revoked.  Performed at [facility], [address] [place], [place] [address]      Time coordinating discharge: Over 30 minutes  SIGNED:   Little Ishikawa, DO [facility] 11/04/2021, 6:09 PM Pager   If 7PM-7AM, please contact night-coverage [email or web address]

## 2021-11-04 NOTE — ED Notes (Signed)
Rn to bedside to introduce self to pt. Pt awake and alert and in no distress.

## 2021-11-04 NOTE — ED Notes (Signed)
Pt assisted to get washed off and back into bed. Pt was slightly dizzy upon walking but then got better.

## 2021-11-04 NOTE — H&P (Signed)
History and Physical    Melanie Ray XBL:[id number] DOB: Oct 12, 1951 DOA: 11/03/2021  PCP: Leone Haven, MD  Patient coming from: Home.  Chief Complaint: Shortness of breath.  HPI: Melanie Ray is a 70 y.o. female with history of CAD status post stenting, CVA, chronic systolic heart failure, diabetes mellitus presents to the ER because of increasing shortness of breath.  Patient states he has been short of breath for the week with minimal exertion.  About 3 days ago patient also had a brief episode of chest pain which was substernal lasting for few minutes.  Resolved without any intervention.  Given the worsening shortness of breath patient presents to the ER.  ED Course: In the ER chest x-ray shows mild congestion on exam patient is diffusely wheezing also.  COVID test was negative EKG shows normal sinus rhythm BNP was 2000 high sensitive troponins were 43 and 51.  Patient was given 40 mg IV Lasix admitted for CHF exacerbation.  COVID test negative.  Review of Systems: As per HPI, rest all negative.   Past Medical History:  Diagnosis Date   Anemia    Anxiety    Back pain    CAD (coronary artery disease) 04/11/2016   S/p ant STEMI 5/17: LHC >> LAD proximal 80%, mid 80%, distal 50%, ostial D1 60%; LCx with LPDA lesion 30%; RCA Mild calcification with no significant stenosis in a medium caliber, nondominant RCA; LVEF is estimated at 45% with inferoapical and lateral wall akinesis >> PCI: PCI: 3.5 x 24 mm Promus DES to prox LAD, 2.5 x 12 mm Promus DES to mid LAD. // Myoview 7/21: EF 20, no ischemia; high risk    Chest pain    Chronic systolic CHF (congestive heart failure) ([facility]) 03/21/2016   Echo 01/30/17: Diff HK, mild focal basal septal hypertrophy, EF 30-35, mild AI, MAC, mild MR // Echo 06/08/16: Mild focal basal septal hypertrophy, EF 25-30%, diff HK, ant-septal AK, Gr 1 DD, mild AI, MAC, mild MR, PASP 37 mmHg // Echo 03/18/16: EF 30-35%, ant-septal AK, Gr 1 DD, mild  MR, severe LAE.   Constipation    Depression    Diabetes mellitus type 2 in obese (HCC)    Dizziness    Glaucoma    Gout    Heart disease    Heartburn    History of acute anterior wall MI 03/17/2016   History of heart attack    Hyperlipemia    Hypertension    Ischemic cardiomyopathy 10/02/2016   Refused ICD   Joint pain    Left bundle branch block    Myocardial infarction (HCC)    Nausea    Positive colorectal cancer screening using Cologuard test 03/01/2020   Sleep apnea    SOB (shortness of breath)    Suicidal ideation 08/27/2018   Swelling    feet or legs    Past Surgical History:  Procedure Laterality Date   APPENDECTOMY     CARDIAC CATHETERIZATION N/A 03/17/2016   Procedure: Left Heart Cath and Coronary Angiography;  Surgeon: Sherren Mocha, MD;  Location: [facility];  Service: Cardiovascular;  Laterality: N/A;   CARDIAC CATHETERIZATION N/A 03/17/2016   Procedure: Coronary Stent Intervention;  Surgeon: Sherren Mocha, MD;  Location: [facility];  Service: Cardiovascular;  Laterality: N/A;   COLONOSCOPY WITH PROPOFOL N/A 08/13/2017   Procedure: COLONOSCOPY WITH PROPOFOL;  Surgeon: Jonathon Bellows, MD;  Location: [facility] ENDOSCOPY;  Service: Gastroenterology;  Laterality: N/A;   COLONOSCOPY WITH  PROPOFOL N/A 03/29/2020   Procedure: COLONOSCOPY WITH PROPOFOL;  Surgeon: Lucilla Lame, MD;  Location: [facility] ENDOSCOPY;  Service: Endoscopy;  Laterality: N/A;   SMALL BOWEL REPAIR     TONSILLECTOMY     UTERINE FIBROID SURGERY       reports that she quit smoking about 24 years ago. Her smoking use included cigarettes. She has never used smokeless tobacco. She reports that she does not currently use alcohol. She reports that she does not use drugs.  Allergies  Allergen Reactions   Tetracycline Swelling   Meperidine Nausea And Vomiting    Nausea   Entresto [Sacubitril-Valsartan] Other (See Comments)    Shortness of Breath    Family History  Problem Relation Age of  Onset   Anxiety disorder Mother    Paranoid behavior Mother    Hypertension Mother    Dementia Mother    High Cholesterol Mother    Diabetes Mother    Hyperlipidemia Mother    Depression Mother    Hypertension Father    High Cholesterol Father    Mood Disorder Sister    Stroke Sister    Anxiety disorder Maternal Aunt    Tuberculosis Paternal Grandfather    Drug abuse Cousin     Prior to Admission medications   Medication Sig Start Date End Date Taking? Authorizing Provider  atorvastatin (LIPITOR) 80 MG tablet Take 1 tablet by mouth once daily 06/13/21  Yes Bensimhon, Shaune Pascal, MD  carvedilol (COREG) 12.5 MG tablet TAKE 1 TABLET BY MOUTH TWICE DAILY WITH MEALS 06/05/21  Yes Sherren Mocha, MD  clopidogrel (PLAVIX) 75 MG tablet Take 1 tablet by mouth once daily 10/03/21  Yes Leone Haven, MD  dorzolamide-timolol (COSOPT) 22.3-6.8 MG/ML ophthalmic solution Place 1 drop into both eyes 2 (two) times daily.  01/17/15  Yes [provider]  furosemide (LASIX) 40 MG tablet Take 1 tablet (40 mg total) by mouth daily. 03/02/21  Yes Bensimhon, Shaune Pascal, MD  isosorbide mononitrate (IMDUR) 30 MG 24 hr tablet Take 1 tablet (30 mg total) by mouth daily. 02/27/21  Yes Sherren Mocha, MD  latanoprost (XALATAN) 0.005 % ophthalmic solution Place 1 drop into both eyes at bedtime. 03/15/20  Yes [provider]  LORazepam (ATIVAN) 0.5 MG tablet Take 0.5 mg by mouth daily as needed. 10/23/21  Yes [provider]  lurasidone (LATUDA) 40 MG TABS tablet Take 40 mg by mouth daily with breakfast.   Yes [provider]  spironolactone (ALDACTONE) 25 MG tablet Take half a tablet by mouth once a day 02/27/21  Yes Sherren Mocha, MD  traZODone (DESYREL) 150 MG tablet Take 150 mg by mouth at bedtime. 10/23/21  Yes [provider]  valsartan (DIOVAN) 80 MG tablet Take 1 tablet (80 mg total) by mouth 2 (two) times daily. 02/27/21  Yes Bensimhon, Shaune Pascal, MD  venlafaxine XR  (EFFEXOR-XR) 150 MG 24 hr capsule TAKE 2 CAPSULES BY MOUTH ONCE DAILY (NEEDS to follow-up with psychiatry for further refills) 11/02/20  Yes Leone Haven, MD  nitroGLYCERIN (NITROSTAT) 0.4 MG SL tablet DISSOLVE ONE TABLET UNDER THE TONGUE EVERY 5 MINUTES AS NEEDED FOR CHEST PAIN.  DO NOT EXCEED A TOTAL OF 3 DOSES IN 15 MINUTES 06/01/20   Sherren Mocha, MD    Physical Exam: Constitutional: Moderately built and nourished. Vitals:   11/04/21 0100 11/04/21 0130 11/04/21 0300 11/04/21 0400  BP: (!) 126/107 (!) 150/83 (!) 152/83 (!) 157/88  Pulse: 87 72 72 79  Resp: (!) 26  _0 Temp:      TempSrc:      SpO2: 96% 95% 98% 97%   Eyes: Anicteric no pallor. ENMT: No discharge from the ears eyes nose and mouth. Neck: JVD elevated no mass felt. Respiratory: Bilateral expiratory wheeze heard. Cardiovascular: S1-S2 heard. Abdomen: Soft nontender bowel sound present. Musculoskeletal: No edema. Skin: No rash. Neurologic: Alert awake oriented time place and person.  Moves all extremities. Psychiatric: Appears normal.  Normal affect.   Labs on Admission: I have personally reviewed following labs and imaging studies  CBC: Recent Labs  Lab 11/03/21 1130  WBC 7.1  HGB 10.7*  HCT 33.4*  MCV 85.4  PLT 409   Basic Metabolic Panel: Recent Labs  Lab 11/03/21 1130  NA 139  K 3.9  CL 111  CO2 24  GLUCOSE 110*  BUN 19  CREATININE 0.83  CALCIUM 9.3   GFR: CrCl cannot be calculated (Unknown ideal weight.). Liver Function Tests: Recent Labs  Lab 11/03/21 1130  AST 49*  ALT 91*  ALKPHOS 143*  BILITOT 1.2  PROT 6.2*  ALBUMIN 3.5   No results for input(s): LIPASE, AMYLASE in the last 168 hours. No results for input(s): AMMONIA in the last 168 hours. Coagulation Profile: No results for input(s): INR, PROTIME in the last 168 hours. Cardiac Enzymes: No results for input(s): CKTOTAL, CKMB, CKMBINDEX, TROPONINI in the last 168 hours. BNP (last 3 results) Recent Labs     01/10/21 1011 01/19/21 1112 09/05/21 1205  PROBNP 2,842* 1,682* 1,262.0*   HbA1C: No results for input(s): HGBA1C in the last 72 hours. CBG: No results for input(s): GLUCAP in the last 168 hours. Lipid Profile: No results for input(s): CHOL, HDL, LDLCALC, TRIG, CHOLHDL, LDLDIRECT in the last 72 hours. Thyroid Function Tests: No results for input(s): TSH, T4TOTAL, FREET4, T3FREE, THYROIDAB in the last 72 hours. Anemia Panel: No results for input(s): VITAMINB12, FOLATE, FERRITIN, TIBC, IRON, RETICCTPCT in the last 72 hours. Urine analysis:    Component Value Date/Time   COLORURINE YELLOW (A) 11/22/2016 1138   APPEARANCEUR CLEAR (A) 11/22/2016 1138   LABSPEC 1.019 11/22/2016 1138   PHURINE 5.0 11/22/2016 1138   GLUCOSEU NEGATIVE 11/22/2016 1138   HGBUR NEGATIVE 11/22/2016 1138   BILIRUBINUR NEGATIVE 11/22/2016 1138   KETONESUR NEGATIVE 11/22/2016 1138   PROTEINUR NEGATIVE 11/22/2016 1138   NITRITE NEGATIVE 11/22/2016 1138   LEUKOCYTESUR NEGATIVE 11/22/2016 1138   Sepsis Labs: _1 (procalcitonin:4,lacticidven:4) ) Recent Results (from the past 240 hour(s))  Resp Panel by RT-PCR (Flu A&B, Covid) Nasopharyngeal Swab     Status: None   Collection Time: 11/03/21 11:39 PM   Specimen: Nasopharyngeal Swab; Nasopharyngeal(NP) swabs in vial transport medium  Result Value Ref Range Status   SARS Coronavirus 2 by RT PCR NEGATIVE NEGATIVE Final    Comment: (NOTE) SARS-CoV-2 target nucleic acids are NOT DETECTED.  The SARS-CoV-2 RNA is generally detectable in upper respiratory specimens during the acute phase of infection. The lowest concentration of SARS-CoV-2 viral copies this assay can detect is 138 copies/mL. A negative result does not preclude SARS-Cov-2 infection and should not be used as the sole basis for treatment or other patient management decisions. A negative result may occur with  improper specimen collection/handling, submission of specimen other than  nasopharyngeal swab, presence of viral mutation(s) within the areas targeted by this assay, and inadequate number of viral copies(<138 copies/mL). A negative result must be combined with clinical observations, patient history, and epidemiological information. The expected result is Negative.  Fact Sheet for Patients:  EntrepreneurPulse.com.au  Fact Sheet for Healthcare Providers:  IncredibleEmployment.be  This test is no t yet approved or cleared by the [place] FDA and  has been authorized for detection and/or diagnosis of SARS-CoV-2 by FDA under an Emergency Use Authorization (EUA). This EUA will remain  in effect (meaning this test can be used) for the duration of the COVID-19 declaration under Section 564(b)(1) of the Act, 21 U.S.C.section 360bbb-3(b)(1), unless the authorization is terminated  or revoked sooner.       Influenza A by PCR NEGATIVE NEGATIVE Final   Influenza B by PCR NEGATIVE NEGATIVE Final    Comment: (NOTE) The Xpert Xpress SARS-CoV-2/FLU/RSV plus assay is intended as an aid in the diagnosis of influenza from Nasopharyngeal swab specimens and should not be used as a sole basis for treatment. Nasal washings and aspirates are unacceptable for Xpert Xpress SARS-CoV-2/FLU/RSV testing.  Fact Sheet for Patients: EntrepreneurPulse.com.au  Fact Sheet for Healthcare Providers: IncredibleEmployment.be  This test is not yet approved or cleared by the [place] FDA and has been authorized for detection and/or diagnosis of SARS-CoV-2 by FDA under an Emergency Use Authorization (EUA). This EUA will remain in effect (meaning this test can be used) for the duration of the COVID-19 declaration under Section 564(b)(1) of the Act, 21 U.S.C. section 360bbb-3(b)(1), unless the authorization is terminated or revoked.  Performed at [facility], [address] [place], [place]  [address]      Radiological Exams on Admission: DG Chest 2 View  Result Date: 11/03/2021 CLINICAL DATA:  Dizziness, shortness of breath EXAM: CHEST - 2 VIEW COMPARISON:  Chest radiograph 04/21/2021 FINDINGS: The heart is enlarged. The upper mediastinal contours are within normal limits. There are patchy opacities in the lung bases. There is vascular congestion without definite overt pulmonary edema. There is no pneumothorax. There is no pleural effusion. There is no acute osseous abnormality. IMPRESSION: 1. Mild cardiomegaly with vascular congestion but no definite overt pulmonary edema. 2. Patchy opacities in the lung bases may reflect atelectasis or infection. Electronically Signed   By: Valetta Mole M.D.   On: 11/03/2021 12:08    EKG: Independently reviewed.  Normal sinus rhythm LVH.  Assessment/Plan Principal Problem:   Acute on chronic systolic CHF (congestive heart failure) (HCC) Active Problems:   Essential hypertension   DM type 2 (diabetes mellitus, type 2) (HCC)   CAD (coronary artery disease)   Acute CHF (congestive heart failure) (HCC)    Acute on chronic systolic heart failure last EF measured was 30 to 35% with grade 1 diastolic dysfunction on August 2019 presently on Lasix 40 mg IV every 12 with ARB spironolactone and beta-blockers will be continued we will closely follow intake output metabolic panel and daily weights. Acute bronchitis likely contributing to patient's symptoms.  On exam patient has bilateral diffuse wheezing.  I kept patient on albuterol nebulizer and Pulmicort.  May need steroids. History of CAD status post stent placement on statins beta-blockers and aspirin.  Has had a brief episode of chest pressure about 3 days ago.  Presently chest pain-free will trend cardiac markers. Hypertension we will continue home medications including ARB beta-blockers and Lasix. Diabetes mellitus type 2 presently not on any medications hemoglobin A1c about 60 days ago was 5.9.  We  will keep patient on sliding scale coverage. History of stroke on aspirin and statins. Chronic dizziness being followed by neurologist and ENT. Chronic anemia follow CBC.   DVT prophylaxis: Lovenox. Code Status:  Full code. Family Communication: Discussed with patient. Disposition Plan: Home. Consults called: None. Admission status: Observation.   Rise Patience MD [facility] Pager [phone].  If 7PM-7AM, please contact night-coverage [email or web address] Password [email or web address]  11/04/2021, 5:29 AM

## 2021-11-07 ENCOUNTER — Telehealth (HOSPITAL_COMMUNITY): Payer: Self-pay | Admitting: Emergency Medicine

## 2021-11-07 DIAGNOSIS — Z01818 Encounter for other preprocedural examination: Secondary | ICD-10-CM | POA: Diagnosis not present

## 2021-11-07 NOTE — Telephone Encounter (Signed)
Reaching out to patient to offer assistance regarding upcoming cardiac imaging study; pt verbalizes understanding of appt date/time, parking situation and where to check in, pre-test NPO status and medications ordered, and verified current allergies; name and call back number provided for further questions should they arise Melanie Bond RN Navigator Cardiac Imaging [facility] [phone] office [phone] cell  Denies metal (wig has clip- I advised her that it would need removed but she could wear a bonnet).  Denies IV issues Arrival 11:30a

## 2021-11-08 ENCOUNTER — Ambulatory Visit (HOSPITAL_COMMUNITY)
Admission: RE | Admit: 2021-11-08 | Discharge: 2021-11-08 | Disposition: A | Discharge status: Home / Self Care | Payer: Medicare Other | Source: Ambulatory Visit | Attending: Internal Medicine | Admitting: Internal Medicine

## 2021-11-08 ENCOUNTER — Other Ambulatory Visit: Payer: Self-pay

## 2021-11-08 DIAGNOSIS — I5022 Chronic systolic (congestive) heart failure: Secondary | ICD-10-CM | POA: Insufficient documentation

## 2021-11-08 IMAGING — MR MR CARD MORPHOLOGY WO/W CM
45 of 48 series · 45 of 48 positions shown · IV contrast (Contrast agent)
Comparison: none

CLINICAL DATA: Ischemic cardiomyopathy

EXAM:
CARDIAC MRI
TECHNIQUE: The patient was scanned on a 1.5 Tesla GE magnet. A dedicated
cardiac coil was used. Functional imaging was done using Fiesta
sequences. [DATE], and 4 chamber views were done to assess for RWMA's.
Modified MAXIMOO rule using a short axis stack was used to
calculate an ejection fraction on a dedicated work station using
Circle software. The patient received 8 cc of Gadavist. After 10
minutes inversion recovery sequences were used to assess for
infiltration and scar tissue.

[Series 4: [id number] · axial · 8.0mm · 1.41mm/px · 1 of 16 slices shown]
[im 1/16]
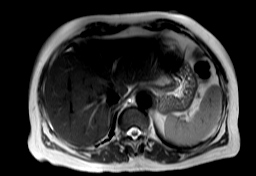

[Series 8: bSSFP · oblique · 8.0mm · 1.61mm/px · 1 of 20 slices shown (1 of 20)]
[im 1/20]
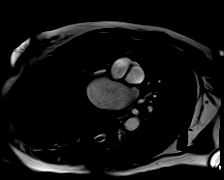

[Series 9: bSSFP · oblique · 8.0mm · 1.61mm/px · 1 of 20 slices shown (2 of 20)]
[im 1/20]
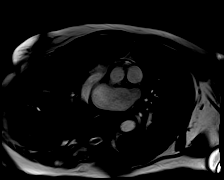

[Series 10: bSSFP · oblique · 8.0mm · 1.61mm/px · 1 of 20 slices shown (3 of 20)]
[im 1/20]
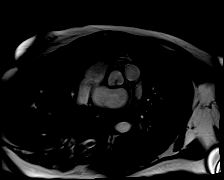

[Series 11: bSSFP · oblique · 8.0mm · 1.61mm/px · 1 of 20 slices shown (4 of 20)]
[im 1/20]
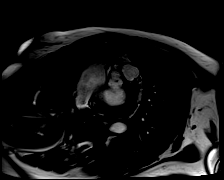

[Series 12: bSSFP · oblique · 8.0mm · 1.61mm/px · 1 of 20 slices shown (5 of 20)]
[im 1/20]
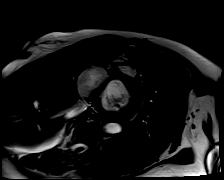

[Series 13: bSSFP · oblique · 8.0mm · 1.61mm/px · 1 of 20 slices shown (6 of 20)]
[im 1/20]
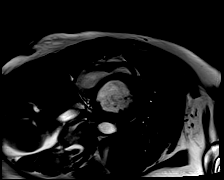

[Series 14: bSSFP · oblique · 8.0mm · 1.61mm/px · 1 of 20 slices shown (7 of 20)]
[im 1/20]
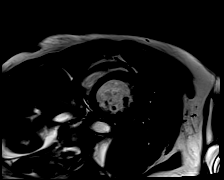

[Series 15: bSSFP · oblique · 8.0mm · 1.61mm/px · 1 of 20 slices shown (8 of 20)]
[im 1/20]
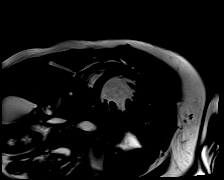

[Series 16: bSSFP · oblique · 8.0mm · 1.61mm/px · 1 of 20 slices shown (9 of 20)]
[im 1/20]
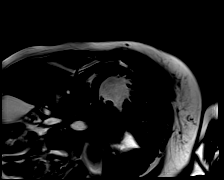

[Series 17: bSSFP · oblique · 8.0mm · 1.61mm/px · 1 of 20 slices shown (10 of 20)]
[im 1/20]
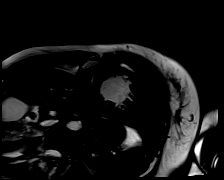

[Series 18: bSSFP · oblique · 8.0mm · 1.61mm/px · 1 of 20 slices shown (11 of 20)]
[im 1/20]
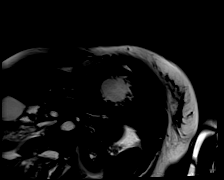

[Series 19: bSSFP · oblique · 8.0mm · 1.61mm/px · 1 of 20 slices shown (12 of 20)]
[im 1/20]
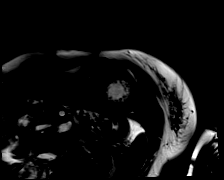

[Series 20: bSSFP · oblique · 8.0mm · 1.61mm/px · 1 of 20 slices shown (13 of 20)]
[im 1/20]
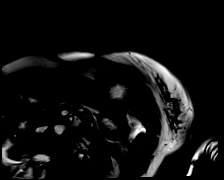

[Series 21: bSSFP · oblique · 8.0mm · 1.61mm/px · 1 of 20 slices shown (14 of 20)]
[im 1/20]
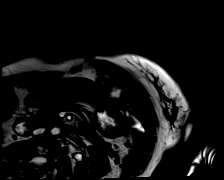

[Series 22: bSSFP · oblique · 8.0mm · 1.61mm/px · 1 of 20 slices shown (15 of 20)]
[im 1/20]
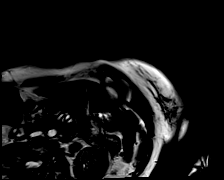

[Series 23: bSSFP · oblique · 8.0mm · 1.61mm/px · 1 of 20 slices shown (16 of 20)]
[im 1/20]
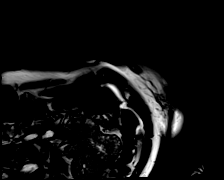

[Series 24: bSSFP · sagittal · 6.0mm · 1.41mm/px · 1 of 16 slices shown (17 of 20)]
[im 1/16]
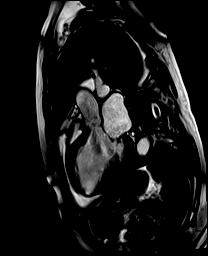

[Series 25: bSSFP · oblique · 6.0mm · 1.41mm/px · 1 of 18 slices shown (18 of 20)]
[im 1/18]
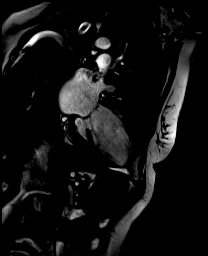

[Series 26: bSSFP · oblique · 6.0mm · 1.41mm/px · 1 of 18 slices shown (19 of 20)]
[im 1/18]
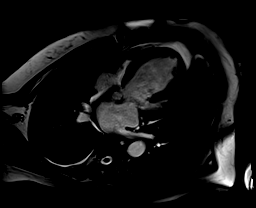

[Series 27: (id)_long_t1 · oblique · 8.0mm · 1.56mm/px · 1 of 24 slices shown]
[im 1/24]
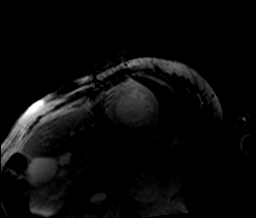

[Series 28: (id)_long_t1_moco · oblique · 8.0mm · 1.56mm/px · 1 of 24 slices shown]
[im 1/24]
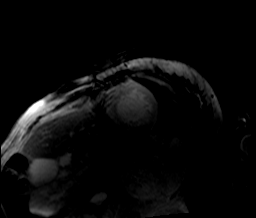

[Series 29: (id)_long_t1_moco_t1 · oblique · 8.0mm · 1.56mm/px · 1 of 6 slices shown]
[im 1/6]
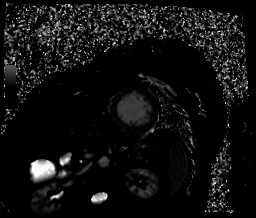

[Series 31: (id)_trufi · oblique · 8.0mm · 2.08mm/px · 1 of 9 slices shown]
[im 1/9]
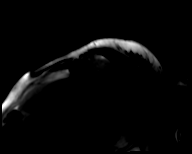

[Series 32: (id)_trufi_moco · oblique · 8.0mm · 2.08mm/px · 1 of 9 slices shown]
[im 1/9]
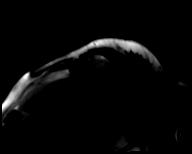

[Series 33: (id)_trufi_moco_t2 · oblique · 8.0mm · 2.08mm/px · 1 of 3 slices shown]
[im 1/3]
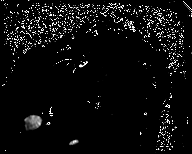

[Series 35: pre short axis · oblique · non-contrast · 8.0mm · 2.25mm/px · 1 of 10 slices shown (1 of 6)]
[im 1/10]
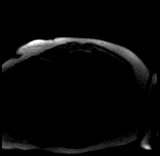

[Series 36: pre short axis · oblique · non-contrast · 8.0mm · 2.25mm/px · 1 of 10 slices shown (2 of 6)]
[im 1/10]
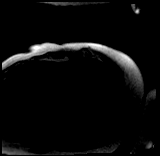

[Series 37: pre short axis · oblique · non-contrast · 8.0mm · 2.25mm/px · 1 of 10 slices shown (3 of 6)]
[im 1/10]
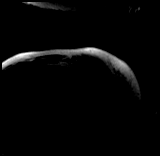

[Series 38: pre short axis · oblique · non-contrast · 8.0mm · 2.25mm/px · 1 of 10 slices shown (4 of 6)]
[im 1/10]
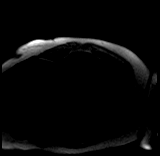

[Series 39: pre short axis · oblique · non-contrast · 8.0mm · 2.25mm/px · 1 of 10 slices shown (5 of 6)]
[im 1/10]
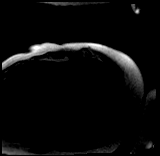

[Series 40: pre short axis · oblique · non-contrast · 8.0mm · 2.25mm/px · 1 of 10 slices shown (6 of 6)]
[im 1/10]
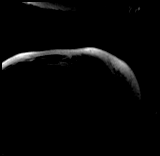

[Series 41: rest short axis · oblique · 8.0mm · 2.25mm/px · 1 of 60 slices shown (1 of 6)]
[im 1/60]
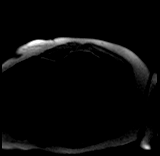

[Series 42: rest short axis · oblique · 8.0mm · 2.25mm/px · 1 of 60 slices shown (2 of 6)]
[im 1/60]
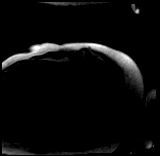

[Series 43: rest short axis · oblique · 8.0mm · 2.25mm/px · 1 of 60 slices shown (3 of 6)]
[im 1/60]
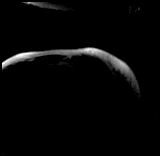

[Series 44: rest short axis · oblique · 8.0mm · 2.25mm/px · 1 of 60 slices shown (4 of 6)]
[im 1/60]
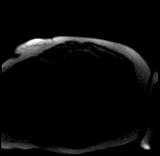

[Series 45: rest short axis · oblique · 8.0mm · 2.25mm/px · 1 of 60 slices shown (5 of 6)]
[im 1/60]
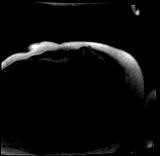

[Series 46: rest short axis · oblique · 8.0mm · 2.25mm/px · 1 of 60 slices shown (6 of 6)]
[im 1/60]
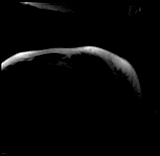

[Series 47: bSSFP · coronal · 6.0mm · 1.41mm/px · 1 of 17 slices shown (20 of 20)]
[im 1/17]
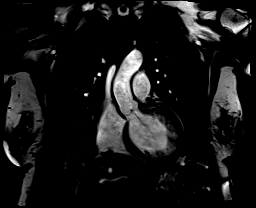

[Series 48: cine rvit · oblique · 6.0mm · 1.41mm/px · 1 of 18 slices shown]
[im 1/18]
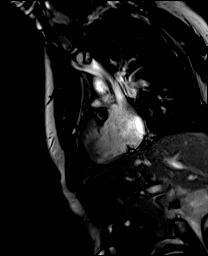

[Series 49: cine rvot · sagittal · 6.0mm · 1.41mm/px · 1 of 17 slices shown (1 of 2)]
[im 1/17]
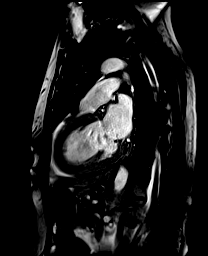

[Series 50: aortic valve cine · axial · 6.0mm · 1.41mm/px · 1 of 18 slices shown]
[im 1/18]
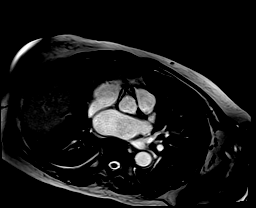

[Series 51: cine rvot · sagittal · 6.0mm · 1.41mm/px · 1 of 17 slices shown (2 of 2)]
[im 1/17]
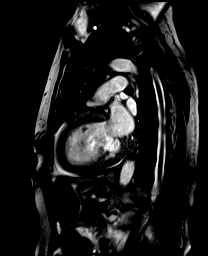

[Series 53: lge_single shot sa · oblique · 8.0mm · 2.08mm/px · 1 of 18 slices shown (1 of 2)]
[im 1/18]
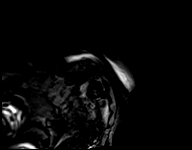

[Series 54: lge_single shot sa · oblique · 8.0mm · 2.08mm/px · 1 of 18 slices shown (2 of 2)]
[im 1/18]
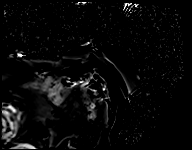

[45 of 48 positions shown; findings below may reference images not displayed]

FINDINGS: Limited images of the lung fields showed no gross abnormalities.

Moderately dilated left ventricle with normal wall thickness.
Diffuse hypokinesis with septal-lateral dyssynchrony suggestive of
LBBB, EF 17%. Normal right ventricular size and systolic function,
EF 59%. Mild left atrial enlargement. Normal right atrium.
Trileaflet aortic valve, no significant stenosis, mild-moderate
regurgitation (flow sequences to quantify were not done). The
posterior mitral leaflet looks restricted with at least moderate
mitral regurgitation (flow sequences to quantify were not done).

First pass perfusion images do not looks significantly abnormal.

On delayed enhancement images, there was no definite myocardial late
gadolinium enhancement (LGE).

MEASUREMENTS:
MEASUREMENTS
LVEDV 258 mL

LVSV 45 mL

LVEF 17%

RVEDV 102 mL
RVSV 60 mL
RVEF 59%

T1 [F9], mildly abnormal. Incomplete imaging so unable to calculate
ECV.
IMPRESSION: 1. Moderately dilated LV with diffuse hypokinesis and septal-lateral
dyssynchrony consistent with LBBB. EF 17%.

2.  Normal RV size and systolic function, EF 59%.

3. At least moderate MR, likely functional with posterior leaflet
restriction. Unable to quantify as flow sequences not done.

4.  No significant LGE noted.

With lack of LGE, LBBB cardiomyopathy may be a major contributor to
low EF.

MAXIMOO

## 2021-11-08 MED ORDER — GADOBUTROL 1 MMOL/ML IV SOLN
10.0000 mL | Freq: Once | INTRAVENOUS | Status: AC | PRN
  Administered 2021-11-08: 10 mL via INTRAVENOUS

## 2021-11-09 ENCOUNTER — Telehealth: Payer: Self-pay | Admitting: Family Medicine

## 2021-11-09 ENCOUNTER — Inpatient Hospital Stay: Payer: Medicare Other | Attending: Oncology | Admitting: Oncology

## 2021-11-09 ENCOUNTER — Inpatient Hospital Stay: Payer: Medicare Other

## 2021-11-09 ENCOUNTER — Encounter: Payer: Self-pay | Admitting: Oncology

## 2021-11-09 DIAGNOSIS — Z79899 Other long term (current) drug therapy: Secondary | ICD-10-CM | POA: Diagnosis not present

## 2021-11-09 DIAGNOSIS — D649 Anemia, unspecified: Secondary | ICD-10-CM | POA: Diagnosis not present

## 2021-11-09 DIAGNOSIS — R6889 Other general symptoms and signs: Secondary | ICD-10-CM

## 2021-11-09 DIAGNOSIS — I509 Heart failure, unspecified: Secondary | ICD-10-CM | POA: Diagnosis not present

## 2021-11-09 LAB — FERRITIN: Ferritin: 84 ng/mL (ref 11–307)

## 2021-11-09 LAB — CBC
HCT: 37 % (ref 36.0–46.0)
Hemoglobin: 11.7 g/dL — ABNORMAL LOW (ref 12.0–15.0)
MCH: 26.9 pg (ref 26.0–34.0)
MCHC: 31.6 g/dL (ref 30.0–36.0)
MCV: 85.1 fL (ref 80.0–100.0)
Platelets: 352 K/uL (ref 150–400)
RBC: 4.35 MIL/uL (ref 3.87–5.11)
RDW: 14.1 % (ref 11.5–15.5)
WBC: 6 K/uL (ref 4.0–10.5)
nRBC: 0 % (ref 0.0–0.2)

## 2021-11-09 LAB — IRON AND TIBC
Iron: 70 ug/dL (ref 28–170)
Saturation Ratios: 20 % (ref 10.4–31.8)
TIBC: 349 ug/dL (ref 250–450)
UIBC: 279 ug/dL

## 2021-11-09 LAB — RETICULOCYTES
Immature Retic Fract: 4.4 % (ref 2.3–15.9)
RBC.: 4.33 MIL/uL (ref 3.87–5.11)
Retic Count, Absolute: 77.5 K/uL (ref 19.0–186.0)
Retic Ct Pct: 1.8 % (ref 0.4–3.1)

## 2021-11-09 LAB — FOLATE: Folate: 15.3 ng/mL (ref >5.9)

## 2021-11-09 LAB — VITAMIN B12: Vitamin B-12: 1007 pg/mL — ABNORMAL HIGH (ref 180–914)

## 2021-11-09 LAB — DAT, POLYSPECIFIC AHG (ARMC ONLY): Polyspecific AHG test: NEGATIVE

## 2021-11-09 LAB — LACTATE DEHYDROGENASE: LDH: 136 U/L (ref 98–192)

## 2021-11-09 NOTE — Telephone Encounter (Signed)
Lexie from [facility], [phone]. She was calling office to see if office received medical clearance form.

## 2021-11-10 DIAGNOSIS — Z01812 Encounter for preprocedural laboratory examination: Secondary | ICD-10-CM | POA: Diagnosis not present

## 2021-11-10 DIAGNOSIS — E785 Hyperlipidemia, unspecified: Secondary | ICD-10-CM | POA: Diagnosis not present

## 2021-11-10 DIAGNOSIS — I252 Old myocardial infarction: Secondary | ICD-10-CM | POA: Diagnosis not present

## 2021-11-10 DIAGNOSIS — G4733 Obstructive sleep apnea (adult) (pediatric): Secondary | ICD-10-CM | POA: Diagnosis not present

## 2021-11-10 DIAGNOSIS — I447 Left bundle-branch block, unspecified: Secondary | ICD-10-CM | POA: Diagnosis not present

## 2021-11-10 DIAGNOSIS — I110 Hypertensive heart disease with heart failure: Secondary | ICD-10-CM | POA: Diagnosis not present

## 2021-11-10 DIAGNOSIS — Z967 Presence of other bone and tendon implants: Secondary | ICD-10-CM | POA: Diagnosis not present

## 2021-11-10 DIAGNOSIS — E119 Type 2 diabetes mellitus without complications: Secondary | ICD-10-CM | POA: Diagnosis not present

## 2021-11-10 DIAGNOSIS — I2510 Atherosclerotic heart disease of native coronary artery without angina pectoris: Secondary | ICD-10-CM | POA: Diagnosis not present

## 2021-11-10 DIAGNOSIS — S62627A Displaced fracture of medial phalanx of left little finger, initial encounter for closed fracture: Secondary | ICD-10-CM | POA: Diagnosis not present

## 2021-11-10 DIAGNOSIS — Z8673 Personal history of transient ischemic attack (TIA), and cerebral infarction without residual deficits: Secondary | ICD-10-CM | POA: Diagnosis not present

## 2021-11-10 DIAGNOSIS — I509 Heart failure, unspecified: Secondary | ICD-10-CM | POA: Diagnosis not present

## 2021-11-10 DIAGNOSIS — I6503 Occlusion and stenosis of bilateral vertebral arteries: Secondary | ICD-10-CM | POA: Diagnosis not present

## 2021-11-10 DIAGNOSIS — I255 Ischemic cardiomyopathy: Secondary | ICD-10-CM | POA: Diagnosis not present

## 2021-11-10 LAB — ERYTHROPOIETIN: Erythropoietin: 13.8 m[IU]/mL (ref 2.6–18.5)

## 2021-11-10 LAB — HAPTOGLOBIN: Haptoglobin: 97 mg/dL (ref 42–346)

## 2021-11-10 NOTE — Telephone Encounter (Signed)
I called to the office of dental care and informed them that I had faxed the form 3 times and the last time I sent it was on 10/27/2021 and they put me on hold and found the form.  Monte Bronder,cma

## 2021-11-13 LAB — PROTEIN ELECTROPHORESIS, SERUM
A/G Ratio: 1.2 (ref 0.7–1.7)
Albumin ELP: 3.6 g/dL (ref 2.9–4.4)
Alpha-1-Globulin: 0.4 g/dL (ref 0.0–0.4)
Alpha-2-Globulin: 0.6 g/dL (ref 0.4–1.0)
Beta Globulin: 1.2 g/dL (ref 0.7–1.3)
Gamma Globulin: 0.8 g/dL (ref 0.4–1.8)
Globulin, Total: 3 g/dL (ref 2.2–3.9)
Total Protein ELP: 6.6 g/dL (ref 6.0–8.5)

## 2021-11-15 ENCOUNTER — Ambulatory Visit (HOSPITAL_COMMUNITY)
Admission: RE | Admit: 2021-11-15 | Discharge: 2021-11-15 | Disposition: A | Discharge status: Home / Self Care | Payer: Medicare Other | Source: Ambulatory Visit | Attending: Internal Medicine | Admitting: Internal Medicine

## 2021-11-15 ENCOUNTER — Other Ambulatory Visit: Payer: Self-pay

## 2021-11-15 ENCOUNTER — Encounter (HOSPITAL_COMMUNITY): Payer: Self-pay | Admitting: Internal Medicine

## 2021-11-15 VITALS — BP 126/64 | HR 66 | Ht 63.0 in | Wt 143.0 lb

## 2021-11-15 DIAGNOSIS — I10 Essential (primary) hypertension: Secondary | ICD-10-CM | POA: Diagnosis not present

## 2021-11-15 DIAGNOSIS — Z79899 Other long term (current) drug therapy: Secondary | ICD-10-CM | POA: Insufficient documentation

## 2021-11-15 DIAGNOSIS — E119 Type 2 diabetes mellitus without complications: Secondary | ICD-10-CM | POA: Diagnosis not present

## 2021-11-15 DIAGNOSIS — E785 Hyperlipidemia, unspecified: Secondary | ICD-10-CM | POA: Diagnosis not present

## 2021-11-15 DIAGNOSIS — I2510 Atherosclerotic heart disease of native coronary artery without angina pectoris: Secondary | ICD-10-CM

## 2021-11-15 DIAGNOSIS — I252 Old myocardial infarction: Secondary | ICD-10-CM | POA: Diagnosis not present

## 2021-11-15 DIAGNOSIS — I447 Left bundle-branch block, unspecified: Secondary | ICD-10-CM

## 2021-11-15 DIAGNOSIS — G4733 Obstructive sleep apnea (adult) (pediatric): Secondary | ICD-10-CM | POA: Insufficient documentation

## 2021-11-15 DIAGNOSIS — Z8673 Personal history of transient ischemic attack (TIA), and cerebral infarction without residual deficits: Secondary | ICD-10-CM | POA: Diagnosis not present

## 2021-11-15 DIAGNOSIS — I340 Nonrheumatic mitral (valve) insufficiency: Secondary | ICD-10-CM | POA: Insufficient documentation

## 2021-11-15 DIAGNOSIS — I110 Hypertensive heart disease with heart failure: Secondary | ICD-10-CM | POA: Insufficient documentation

## 2021-11-15 DIAGNOSIS — Z955 Presence of coronary angioplasty implant and graft: Secondary | ICD-10-CM | POA: Insufficient documentation

## 2021-11-15 DIAGNOSIS — K760 Fatty (change of) liver, not elsewhere classified: Secondary | ICD-10-CM | POA: Diagnosis not present

## 2021-11-15 DIAGNOSIS — I5022 Chronic systolic (congestive) heart failure: Secondary | ICD-10-CM | POA: Diagnosis not present

## 2021-11-15 DIAGNOSIS — Z7902 Long term (current) use of antithrombotics/antiplatelets: Secondary | ICD-10-CM | POA: Diagnosis not present

## 2021-11-15 LAB — COMPREHENSIVE METABOLIC PANEL
ALT: 20 U/L (ref 0–44)
AST: 19 U/L (ref 15–41)
Albumin: 3.8 g/dL (ref 3.5–5.0)
Alkaline Phosphatase: 109 U/L (ref 38–126)
Anion gap: 8 (ref 5–15)
BUN: 18 mg/dL (ref 8–23)
CO2: 25 mmol/L (ref 22–32)
Calcium: 9.6 mg/dL (ref 8.9–10.3)
Chloride: 105 mmol/L (ref 98–111)
Creatinine, Ser: 1.04 mg/dL — ABNORMAL HIGH (ref 0.44–1.00)
GFR, Estimated: 57 mL/min — ABNORMAL LOW (ref >60)
Glucose, Bld: 89 mg/dL (ref 70–99)
Potassium: 3.8 mmol/L (ref 3.5–5.1)
Sodium: 138 mmol/L (ref 135–145)
Total Bilirubin: 0.4 mg/dL (ref 0.3–1.2)
Total Protein: 6.8 g/dL (ref 6.5–8.1)

## 2021-11-15 LAB — BRAIN NATRIURETIC PEPTIDE: B Natriuretic Peptide: 980.3 pg/mL — ABNORMAL HIGH (ref 0.0–100.0)

## 2021-11-15 NOTE — Progress Notes (Signed)
ADVANCED HF CLINIC NOTE  Referring Physician: Dr. Burt Knack Primary Care: Leone Haven, MD Primary Cardiologist: Sherren Mocha, MD   HPI:  Melanie Ray is a 71 y.o. female with the following medical issues. She was referred by Dr. Burt Knack for further assessment and management of her HF.  Coronary artery disease  S/p Anterior STEMI 5/17 >> PCI: DES x 2 to LAD Myoview 7/21: EF 20, no ischemia Heart failure with reduced ejection fraction   Ischemic CM EF 30-35 at time of MI 5/17 >> DC on [facility] >> Pt DC'd at FU visit Echocardiogram 8/17: EF 25-30 Echocardiogram 3/18: EF 30-35 Echocardiogram 8/19: EF 30-35 EP eval (J Allred) x 2 >> pt declined CRT-D Intol of Entresto Echocardiogram 03/2020: EF < 20% Myoview 7/21 EF 20% no ischemia Obsessive Compulsive d/o Diabetes mellitus 2 Hypertension  Hyperlipidemia  LBBB OSA Hepatic steatosis  Hx of cerebellar CVA 03/2020 Bilateral vertebral artery stenosis   We saw her for the first time in 3/22. Referred for cMRI to assess degree of LAD scar. MRI not completed until 11/08/21  cMRI 1. Moderately dilated LV with diffuse hypokinesis and septal-lateral dyssynchrony consistent with LBBB. EF 17%. 2.  Normal RV size and systolic function, EF 57%. 3. At least moderate MR, likely functional with posterior leaflet restriction. Unable to quantify as flow sequences not done. 4.  No significant LGE noted. 5. With lack of LGE, LBBB cardiomyopathy may be a major contributor to low EF.  Here for routine f/u. Was admitted at the end of 12/22 with HF flare. Was not taking lasix regularly. Also ate a lot of salt over Xmas.  Treated with IV lasix. BNP 345 -> 2,040. Treated with IV lasix with improvement. Now taking lasix every day. No recurrent swelling. No orthopnea or PND. + DOE with good days and bad days     Past Medical History:  Diagnosis Date   Anemia    Anxiety    Back pain    CAD (coronary artery disease) 04/11/2016    S/p ant STEMI 5/17: LHC >> LAD proximal 80%, mid 80%, distal 50%, ostial D1 60%; LCx with LPDA lesion 30%; RCA Mild calcification with no significant stenosis in a medium caliber, nondominant RCA; LVEF is estimated at 45% with inferoapical and lateral wall akinesis >> PCI: PCI: 3.5 x 24 mm Promus DES to prox LAD, 2.5 x 12 mm Promus DES to mid LAD. // Myoview 7/21: EF 20, no ischemia; high risk    Chest pain    Chronic systolic CHF (congestive heart failure) ([place]) 03/21/2016   Echo 01/30/17: Diff HK, mild focal basal septal hypertrophy, EF 30-35, mild AI, MAC, mild MR // Echo 06/08/16: Mild focal basal septal hypertrophy, EF 25-30%, diff HK, ant-septal AK, Gr 1 DD, mild AI, MAC, mild MR, PASP 37 mmHg // Echo 03/18/16: EF 30-35%, ant-septal AK, Gr 1 DD, mild MR, severe LAE.   Constipation    Depression    Diabetes mellitus type 2 in obese (HCC)    Dizziness    Glaucoma    Gout    Heart disease    Heartburn    History of acute anterior wall MI 03/17/2016   History of heart attack    Hyperlipemia    Hypertension    Ischemic cardiomyopathy 10/02/2016   Refused ICD   Joint pain    Left bundle branch block    Myocardial infarction ([place])    Nausea    Positive colorectal cancer screening using Cologuard  test 03/01/2020   Sleep apnea    SOB (shortness of breath)    Suicidal ideation 08/27/2018   Swelling    feet or legs    Current Outpatient Medications  Medication Sig Dispense Refill   atorvastatin (LIPITOR) 80 MG tablet Take 1 tablet by mouth once daily 90 tablet 0   carvedilol (COREG) 12.5 MG tablet TAKE 1 TABLET BY MOUTH TWICE DAILY WITH MEALS 180 tablet 1   clopidogrel (PLAVIX) 75 MG tablet Take 1 tablet by mouth once daily 90 tablet 0   dorzolamide-timolol (COSOPT) 22.3-6.8 MG/ML ophthalmic solution Place 1 drop into both eyes 2 (two) times daily.      furosemide (LASIX) 40 MG tablet Take 40 mg by mouth daily.     isosorbide mononitrate (IMDUR) 30 MG 24 hr tablet Take 1 tablet (30 mg  total) by mouth daily. 90 tablet 3   latanoprost (XALATAN) 0.005 % ophthalmic solution Place 1 drop into both eyes at bedtime.     LORazepam (ATIVAN) 0.5 MG tablet Take 0.5 mg by mouth daily as needed.     lurasidone (LATUDA) 40 MG TABS tablet Take 40 mg by mouth daily with breakfast.     nitroGLYCERIN (NITROSTAT) 0.4 MG SL tablet DISSOLVE ONE TABLET UNDER THE TONGUE EVERY 5 MINUTES AS NEEDED FOR CHEST PAIN.  DO NOT EXCEED A TOTAL OF 3 DOSES IN 15 MINUTES 25 tablet 10   spironolactone (ALDACTONE) 25 MG tablet Take half a tablet by mouth once a day 45 tablet 3   traZODone (DESYREL) 150 MG tablet Take 150 mg by mouth at bedtime.     valsartan (DIOVAN) 80 MG tablet Take 1 tablet (80 mg total) by mouth 2 (two) times daily. 180 tablet 3   venlafaxine XR (EFFEXOR-XR) 150 MG 24 hr capsule TAKE 2 CAPSULES BY MOUTH ONCE DAILY (NEEDS to follow-up with psychiatry for further refills) 60 capsule 0   No current facility-administered medications for this encounter.    Allergies  Allergen Reactions   Tetracycline Swelling   Meperidine Nausea And Vomiting    Nausea   Entresto [Sacubitril-Valsartan] Other (See Comments)    Shortness of Breath      Social History   Socioeconomic History   Marital status: Married    Spouse name: Ghislaine Harcum   Number of children: 0   Years of education: BS in education   Highest education level: Not on file  Occupational History   Occupation:  Retired Education officer, museum  Tobacco Use   Smoking status: Former    Types: Cigarettes    Quit date: 04/13/1997    Years since quitting: 24.6   Smokeless tobacco: Never  Vaping Use   Vaping Use: Never used  Substance and Sexual Activity   Alcohol use: Not Currently   Drug use: No   Sexual activity: Not Currently  Other Topics Concern   Not on file  Social History Narrative   Lives in [place] with spouse.  No children.   Retired first [employer] for over 30 years ([place] for 10 years and then in [place]  for over 20 years).   Left-handed   Lives in a two story home       Social Determinants of Health   Financial Resource Strain: Not on file  Food Insecurity: Not on file  Transportation Needs: Not on file  Physical Activity: Not on file  Stress: Not on file  Social Connections: Not on file  Intimate Partner Violence: Not on file  Family History  Problem Relation Age of Onset   Anxiety disorder Mother    Paranoid behavior Mother    Hypertension Mother    Dementia Mother    High Cholesterol Mother    Diabetes Mother    Hyperlipidemia Mother    Depression Mother    Hypertension Father    High Cholesterol Father    Mood Disorder Sister    Stroke Sister    Anxiety disorder Maternal Aunt    Tuberculosis Paternal Grandfather    Drug abuse Cousin     Vitals:   11/15/21 1407  BP: 126/64  Pulse: 66  SpO2: 98%  Weight: 64.9 kg (143 lb)  Height: _0  (1.6 m)   Filed Weights   11/15/21 1407  Weight: 64.9 kg (143 lb)     PHYSICAL EXAM: General:  Well appearing. No resp difficulty HEENT: normal Neck: supple. no JVD. Carotids 2+ bilat; no bruits. No lymphadenopathy or thryomegaly appreciated. Cor: PMI nondisplaced. Regular rate & rhythm. No rubs, gallops or murmurs. Lungs: clear Abdomen: soft, nontender, nondistended. No hepatosplenomegaly. No bruits or masses. Good bowel sounds. Extremities: no cyanosis, clubbing, rash, edema Neuro: alert & orientedx3, cranial nerves grossly intact. moves all 4 extremities w/o difficulty. Affect pleasant  ReDS 30%  ECG: NSR 80 LBBB 152 ms Personally reviewed   ASSESSMENT & PLAN:  1. Chronic systolic HF - initially felt due to ischemic CM. S/p Anterior STEMI 5/17  - Echocardiogram 8/19: EF 30-35% - Echo 5/21 EF < 20%  She has declined CRT-D.   - cMRI 1/23 EF 15% No LGE. RV ok. Severe LV dyssynchrony Moderate MR  - Worsening NYHA III  - Volume status ok ReDS 30% - Continue carvedilol 12.5 bid - Continue valsartan 80 bid  (could not afford Entresto) - Continue spiro 12.5 daily  - Off [name] due to cost.  - Given wide LBBB and no LGE on cMRI she almost certainly has LBBB CM and MR and would likely benefit from CRT-D. Refer to EP  - Labs today.    2. Coronary artery disease involving native coronary artery of native heart without angina pectoris - Hx of anterior STEMI in 5/17 tx with DES x 2 to the LAD.  No LGE on cMRI 1/23 - Myoview 7/21  EF 20% no ischemia.   - No s/s angina - Continue carvedilol, clopidogrel, atorvastatin.    3. Essential hypertension - BP ok  4. LBBB - plan as above. Refer for CRT-D  5. Mitral regurgitation - moderate on echo. Suspect will improve with CRT  Glori Bickers, MD  2:18 PM

## 2021-11-15 NOTE — Patient Instructions (Addendum)
EKG done today.  RedsClip done today.  Labs done today. We will contact you only if your labs are abnormal.  No medication changes were made. Please continue all current medications as prescribed.  You have been referred to [facility] Cardiac electrophysiology.   Your physician recommends that you schedule a follow-up appointment in: 4 months  If you have any questions or concerns before your next appointment please send [name] a message through [place] or call our office at [phone].    TO LEAVE A MESSAGE FOR THE NURSE SELECT OPTION 2, PLEASE LEAVE A MESSAGE INCLUDING: YOUR NAME DATE OF BIRTH CALL BACK NUMBER REASON FOR CALL**this is important as we prioritize the call backs  YOU WILL RECEIVE A CALL BACK THE SAME DAY AS LONG AS YOU CALL BEFORE 4:00 PM   Do the following things EVERYDAY: Weigh yourself in the morning before breakfast. Write it down and keep it in a log. Take your medicines as prescribed Eat low salt foods--Limit salt (sodium) to 2000 mg per day.  Stay as active as you can everyday Limit all fluids for the day to less than 2 liters   At the [facility], you and your health needs are our priority. As part of our continuing mission to provide you with exceptional heart care, we have created designated Provider Care Teams. These Care Teams include your primary Cardiologist (physician) and Advanced Practice Providers (APPs- Physician Assistants and Nurse Practitioners) who all work together to provide you with the care you need, when you need it.   You may see any of the following providers on your designated Care Team at your next follow up: Dr Glori Bickers Dr Haynes Kerns, NP Lyda Jester, [place] Audry Riles, PharmD   Please be sure to bring in all your medications bottles to every appointment.

## 2021-11-15 NOTE — Progress Notes (Signed)
ReDS Vest / Clip - 11/15/21 1407       ReDS Vest / Clip   Station Marker B    Ruler Value 28    ReDS Value Range Low volume    ReDS Actual Value 30

## 2021-11-17 DIAGNOSIS — S62627D Displaced fracture of medial phalanx of left little finger, subsequent encounter for fracture with routine healing: Secondary | ICD-10-CM | POA: Diagnosis not present

## 2021-11-17 DIAGNOSIS — S62626A Displaced fracture of medial phalanx of right little finger, initial encounter for closed fracture: Secondary | ICD-10-CM | POA: Diagnosis not present

## 2021-11-17 DIAGNOSIS — Z7902 Long term (current) use of antithrombotics/antiplatelets: Secondary | ICD-10-CM | POA: Diagnosis not present

## 2021-11-17 DIAGNOSIS — Z472 Encounter for removal of internal fixation device: Secondary | ICD-10-CM | POA: Diagnosis not present

## 2021-11-19 ENCOUNTER — Other Ambulatory Visit: Payer: Self-pay | Admitting: Internal Medicine

## 2021-11-22 DIAGNOSIS — S62627D Displaced fracture of medial phalanx of left little finger, subsequent encounter for fracture with routine healing: Secondary | ICD-10-CM | POA: Diagnosis not present

## 2021-11-28 DIAGNOSIS — S62627D Displaced fracture of medial phalanx of left little finger, subsequent encounter for fracture with routine healing: Secondary | ICD-10-CM | POA: Diagnosis not present

## 2021-11-29 DIAGNOSIS — Z4889 Encounter for other specified surgical aftercare: Secondary | ICD-10-CM | POA: Diagnosis not present

## 2021-12-07 DIAGNOSIS — S62627D Displaced fracture of medial phalanx of left little finger, subsequent encounter for fracture with routine healing: Secondary | ICD-10-CM | POA: Diagnosis not present

## 2021-12-11 ENCOUNTER — Other Ambulatory Visit: Payer: Self-pay | Admitting: Cardiovascular Disease

## 2021-12-13 ENCOUNTER — Encounter: Payer: Self-pay | Admitting: Cardiology

## 2021-12-13 ENCOUNTER — Ambulatory Visit (INDEPENDENT_AMBULATORY_CARE_PROVIDER_SITE_OTHER): Payer: Medicare Other | Admitting: Cardiology

## 2021-12-13 ENCOUNTER — Encounter: Payer: Self-pay | Admitting: *Deleted

## 2021-12-13 ENCOUNTER — Other Ambulatory Visit: Payer: Self-pay

## 2021-12-13 VITALS — BP 130/62 | HR 81 | Ht 63.0 in | Wt 141.0 lb

## 2021-12-13 DIAGNOSIS — Z01818 Encounter for other preprocedural examination: Secondary | ICD-10-CM

## 2021-12-13 DIAGNOSIS — I447 Left bundle-branch block, unspecified: Secondary | ICD-10-CM | POA: Diagnosis not present

## 2021-12-13 DIAGNOSIS — I5022 Chronic systolic (congestive) heart failure: Secondary | ICD-10-CM

## 2021-12-13 DIAGNOSIS — I255 Ischemic cardiomyopathy: Secondary | ICD-10-CM

## 2021-12-13 NOTE — H&P (View-Only) (Signed)
Electrophysiology Office Note:    Date:  12/13/2021   ID:  Melanie Ray Mar 26, 1951, MRN [id number]  PCP:  Leone Haven, MD  [facility] Cardiologist:  Sherren Mocha, MD  [facility] Electrophysiologist:  Vickie Epley, MD   Referring MD: Jolaine Artist, MD   Chief Complaint: Chronic systolic heart failure  History of Present Illness:    Melanie Ray is a 71 y.o. female who presents for an evaluation of chronic systolic heart failure at the request of Dr. Ronna Polio. Their medical history includes coronary artery disease, obsessive-compulsive disorder, diabetes, hypertension, hyperlipidemia, left bundle branch block, CVA.  The patient was last seen by Dr. Kae Heller on November 15, 2021.  Her left ventricular function has been persistently reduced.  She was recently admitted with a heart failure exacerbation in December 2022.  There is felt to be a left bundle associated component of her cardiomyopathy given the amount of dyssynchrony noted on cardiac MRI.  She has NYHA class III symptoms.     Past Medical History:  Diagnosis Date   Anemia    Anxiety    Back pain    CAD (coronary artery disease) 04/11/2016   S/p ant STEMI 5/17: LHC >> LAD proximal 80%, mid 80%, distal 50%, ostial D1 60%; LCx with LPDA lesion 30%; RCA Mild calcification with no significant stenosis in a medium caliber, nondominant RCA; LVEF is estimated at 45% with inferoapical and lateral wall akinesis >> PCI: PCI: 3.5 x 24 mm Promus DES to prox LAD, 2.5 x 12 mm Promus DES to mid LAD. // Myoview 7/21: EF 20, no ischemia; high risk    Chest pain    Chronic systolic CHF (congestive heart failure) ([place]) 03/21/2016   Echo 01/30/17: Diff HK, mild focal basal septal hypertrophy, EF 30-35, mild AI, MAC, mild MR // Echo 06/08/16: Mild focal basal septal hypertrophy, EF 25-30%, diff HK, ant-septal AK, Gr 1 DD, mild AI, MAC, mild MR, PASP 37 mmHg // Echo 03/18/16: EF 30-35%, ant-septal AK, Gr 1 DD,  mild MR, severe LAE.   Constipation    Depression    Diabetes mellitus type 2 in obese (HCC)    Dizziness    Glaucoma    Gout    Heart disease    Heartburn    History of acute anterior wall MI 03/17/2016   History of heart attack    Hyperlipemia    Hypertension    Ischemic cardiomyopathy 10/02/2016   Refused ICD   Joint pain    Left bundle branch block    Myocardial infarction (HCC)    Nausea    Positive colorectal cancer screening using Cologuard test 03/01/2020   Sleep apnea    SOB (shortness of breath)    Suicidal ideation 08/27/2018   Swelling    feet or legs    Past Surgical History:  Procedure Laterality Date   APPENDECTOMY     CARDIAC CATHETERIZATION N/A 03/17/2016   Procedure: Left Heart Cath and Coronary Angiography;  Surgeon: Sherren Mocha, MD;  Location: [place] CV LAB;  Service: Cardiovascular;  Laterality: N/A;   CARDIAC CATHETERIZATION N/A 03/17/2016   Procedure: Coronary Stent Intervention;  Surgeon: Sherren Mocha, MD;  Location: [place] CV LAB;  Service: Cardiovascular;  Laterality: N/A;   COLONOSCOPY WITH PROPOFOL N/A 08/13/2017   Procedure: COLONOSCOPY WITH PROPOFOL;  Surgeon: Jonathon Bellows, MD;  [facility] ENDOSCOPY;  Service: Gastroenterology;  Laterality: N/A;   COLONOSCOPY WITH PROPOFOL N/A 03/29/2020   Procedure:  COLONOSCOPY WITH PROPOFOL;  Surgeon: Lucilla Lame, MD;  Location: [facility] ENDOSCOPY;  Service: Endoscopy;  Laterality: N/A;   SMALL BOWEL REPAIR     TONSILLECTOMY     UTERINE FIBROID SURGERY      Current Medications: Current Meds  Medication Sig   atorvastatin (LIPITOR) 80 MG tablet Take 1 tablet by mouth once daily   carvedilol (COREG) 12.5 MG tablet TAKE 1 TABLET BY MOUTH TWICE DAILY WITH MEALS   clopidogrel (PLAVIX) 75 MG tablet Take 1 tablet by mouth once daily   dorzolamide-timolol (COSOPT) 22.3-6.8 MG/ML ophthalmic solution Place 1 drop into both eyes 2 (two) times daily.    furosemide (LASIX) 40 MG tablet Take 40 mg by mouth  daily.   isosorbide mononitrate (IMDUR) 30 MG 24 hr tablet Take 1 tablet (30 mg total) by mouth daily.   latanoprost (XALATAN) 0.005 % ophthalmic solution Place 1 drop into both eyes at bedtime.   LORazepam (ATIVAN) 0.5 MG tablet Take 0.5 mg by mouth daily as needed.   lurasidone (LATUDA) 40 MG TABS tablet Take 40 mg by mouth daily with breakfast.   nitroGLYCERIN (NITROSTAT) 0.4 MG SL tablet DISSOLVE ONE TABLET UNDER THE TONGUE EVERY FIVE MINUTES AS NEEDED FOR CHEST PAIN. DO NOT EXCEED A TOTAL OF THREE DOSES IN 15 MINUTES   spironolactone (ALDACTONE) 25 MG tablet Take half a tablet by mouth once a day   traZODone (DESYREL) 150 MG tablet Take 150 mg by mouth at bedtime.   valsartan (DIOVAN) 80 MG tablet Take 1 tablet (80 mg total) by mouth 2 (two) times daily. (Patient taking differently: Take 80 mg by mouth daily.)   venlafaxine XR (EFFEXOR-XR) 150 MG 24 hr capsule TAKE 2 CAPSULES BY MOUTH ONCE DAILY (NEEDS to follow-up with psychiatry for further refills)     Allergies:   Tetracycline, Meperidine, and Entresto [sacubitril-valsartan]   Social History   Socioeconomic History   Marital status: Married    Spouse name: Aaryanna Hyden   Number of children: 0   Years of education: BS in education   Highest education level: Not on file  Occupational History   Occupation:  Retired Education officer, museum  Tobacco Use   Smoking status: Former    Types: Cigarettes    Quit date: 04/13/1997    Years since quitting: 24.6   Smokeless tobacco: Never  Vaping Use   Vaping Use: Never used  Substance and Sexual Activity   Alcohol use: Not Currently   Drug use: No   Sexual activity: Not Currently  Other Topics Concern   Not on file  Social History Narrative   Lives in [place] with spouse.  No children.   Retired first [employer] for over 30 years ([place] for 10 years and then in [place] for over 20 years).   Left-handed   Lives in a two story home       Social Determinants of Health    Financial Resource Strain: Not on file  Food Insecurity: Not on file  Transportation Needs: Not on file  Physical Activity: Not on file  Stress: Not on file  Social Connections: Not on file     Family History: The patient's family history includes Anxiety disorder in her maternal aunt and mother; Dementia in her mother; Depression in her mother; Diabetes in her mother; Drug abuse in her cousin; High Cholesterol in her father and mother; Hyperlipidemia in her mother; Hypertension in her father and mother; Mood Disorder in her sister; Paranoid behavior in her mother;  Stroke in her sister; Tuberculosis in her paternal grandfather.  ROS:   Please see the history of present illness.    All other systems reviewed and are negative.  EKGs/Labs/Other Studies Reviewed:    The following studies were reviewed today:  Mar 31, 2020 echo Left ventricular function less than 20% Right ventricular function normal Moderately dilated left atrium Moderate MR  November 08, 2021 cardiac MRI Left ventricular function severely reduced, 17% Dyssynchrony in the LV Normal RV function Mild to moderate AI Moderate MR No LGE  EKG:  The ekg ordered today demonstrates sinus rhythm, left bundle much block   Recent Labs: 09/05/2021: Pro B Natriuretic peptide (BNP) 1,262.0; TSH 0.64 11/09/2021: Hemoglobin 11.7; Platelets 352 11/15/2021: ALT 20; B Natriuretic Peptide 980.3; BUN 18; Creatinine, Ser 1.04; Potassium 3.8; Sodium 138  Recent Lipid Panel    Component Value Date/Time   CHOL 112 04/18/2020 0815   TRIG 65 04/18/2020 0815   HDL 39 (L) 04/18/2020 0815   CHOLHDL 2.9 04/18/2020 0815   CHOLHDL 3.7 03/31/2020 0514   VLDL 15 03/31/2020 0514   LDLCALC 59 04/18/2020 0815   LDLDIRECT 78.0 02/07/2018 0928    Physical Exam:    VS:  BP 130/62 (BP Location: Left Arm, Patient Position: Sitting, Cuff Size: Normal)    Pulse 81    Ht _0  (1.6 m)    Wt 141 lb (64 kg)    SpO2 97%    BMI 24.98 kg/m     Wt  Readings from Last 3 Encounters:  12/13/21 141 lb (64 kg)  11/15/21 143 lb (64.9 kg)  11/09/21 142 lb 12.8 oz (64.8 kg)     GEN: Well nourished, well developed in no acute distress HEENT: Normal NECK: No JVD; No carotid bruits LYMPHATICS: No lymphadenopathy CARDIAC: RRR, no murmurs, rubs, gallops RESPIRATORY:  Clear to auscultation without rales, wheezing or rhonchi  ABDOMEN: Soft, non-tender, non-distended MUSCULOSKELETAL:  No edema; No deformity  SKIN: Warm and dry NEUROLOGIC:  Alert and oriented x 3 PSYCHIATRIC:  Normal affect       ASSESSMENT:    1. Chronic systolic heart failure ([place])   2. Ischemic cardiomyopathy   3. Left bundle branch block   4. Pre-op evaluation    PLAN:    In order of problems listed above:  #Chronic systolic heart failure #Coronary artery disease #Lipid rich plaque  The patient has NYHA class III heart failure symptoms.  She is on good medical therapy and follows closely in the heart failure clinic.  I do think there is a component of left bundle associated dyssynchrony contributing to her LV dysfunction.  Given her persistently reduced ejection fraction, I do think she would benefit from an ICD and given left bundle, a CRT-D.  I discussed the procedure in detail with the patient including risk, recovery she wished to proceed.  The patient has an ischemic CM (EF 17%), NYHA Class III CHF, and CAD.  He is referred by Dr Haroldine Laws for risk stratification of sudden death and consideration of ICD implantation.  At this time, she meets criteria for ICD implantation for primary prevention of sudden death.  I have had a thorough discussion with the patient reviewing options.  The patient and their family (if available) have had opportunities to ask questions and have them answered. The patient and I have decided together through a shared decision making process to proceed with ICD implant at this time.    Risks, benefits, alternatives to ICD implantation  were  discussed in detail with the patient today. The patient understands that the risks include but are not limited to bleeding, infection, pneumothorax, perforation, tamponade, vascular damage, renal failure, MI, stroke, death, inappropriate shocks, and lead dislodgement and wishes to proceed.  We will therefore schedule device implantation at the next available time.  She will hold her Plavix for 5 days prior to the implant.  Total time spent with patient today 60 minutes. This includes reviewing records, evaluating the patient and coordinating care.  Medication Adjustments/Labs and Tests Ordered: Current medicines are reviewed at length with the patient today.  Concerns regarding medicines are outlined above.  Orders Placed This Encounter  Procedures   CBC w/Diff   Basic Metabolic Panel (BMET)   EKG 12-Lead   No orders of the defined types were placed in this encounter.    Signed, Hilton Cork. Quentin Ore, MD, [facility], [facility] 12/13/2021 5:13 PM    Electrophysiology [facility]

## 2021-12-13 NOTE — Patient Instructions (Addendum)
Medications: Your physician recommends that you continue on your current medications as directed. Please refer to the Current Medication list given to you today. *If you need a refill on your cardiac medications before your next appointment, please call your pharmacy*  Lab Work: None. If you have labs (blood work) drawn today and your tests are completely normal, you will receive your results only by: [facility] (if you have MyChart) OR A paper copy in the mail If you have any lab test that is abnormal or we need to change your treatment, we will call you to review the results.  Testing/Procedures: Your physician has recommended that you have a defibrillator inserted. An implantable cardioverter defibrillator (ICD) is a small device that is placed in your chest or, in rare cases, your abdomen. This device uses electrical pulses or shocks to help control life-threatening, irregular heartbeats that could lead the heart to suddenly stop beating (sudden cardiac arrest). Leads are attached to the ICD that goes into your heart. This is done in the hospital and usually requires an overnight stay. Please see the instruction sheet given to you today for more information.   Follow-Up: At [facility], you and your health needs are our priority.  As part of our continuing mission to provide you with exceptional heart care, we have created designated Provider Care Teams.  These Care Teams include your primary Cardiologist (physician) and Advanced Practice Providers (APPs -  Physician Assistants and Nurse Practitioners) who all work together to provide you with the care you need, when you need it.  Your physician wants you to follow-up in:  (Feb 28 ) You will follow up with the [place] clinic 10-14 days after your procedure. - You will follow up with Dr. Quentin Ore 91 days after your procedure.   We recommend signing up for the patient portal called "MyChart".  Sign up information is provided on  this After Visit Summary.  MyChart is used to connect with patients for Virtual Visits (Telemedicine).  Patients are able to view lab/test results, encounter notes, upcoming appointments, etc.  Non-urgent messages can be sent to your provider as well.   To learn more about what you can do with MyChart, go to [email or web address].    Any Other Special Instructions Will Be Listed Below (If Applicable).  Cardioverter Defibrillator Implantation An implantable cardioverter defibrillator (ICD) is a device that identifies and corrects abnormal heart rhythms. Cardioverter defibrillator implantation is a surgery to place an ICD under the skin in the chest or abdomen. An ICD has a battery, a small computer (pulse generator), and wires (leads) that go into the heart. The ICD detects and corrects two types of dangerous irregular heart rhythms (arrhythmias): A rapid heart rhythm in the lower chambers of the heart (ventricles). This is called ventricular tachycardia. The ventricles contracting in an uncoordinated way. This is called ventricular fibrillation. There are different types of ICDs, and the electrical signals from the ICD can be programmed differently based on the condition being treated. The electrical signals from the ICD can be low-energy pulses, high-energy shocks, or a combination of the two. The low-energy pulses are generally used to restore the heartbeat to normal when it is either too slow (bradycardia) or too fast. These pulses are painless. The high-energy shocks are used to treat abnormal rhythms such as ventricular tachycardia or ventricular fibrillation. This shock may feel like a strong jolt in the chest. Your health care provider may recommend an ICD if you have: Had a  ventricular arrhythmia in the past. A damaged heart because of a disease or heart condition. A weakened heart muscle from a heart attack or cardiac arrest. A congenital heart defect. Long QT syndrome, which is a disorder  of the heart's electrical system. Brugada syndrome, which is a condition that causes a disruption of the heart's normal rhythm. Tell a health care provider about: Any allergies you have. All medicines you are taking, including vitamins, herbs, eye drops, creams, and over-the-counter medicines. Any problems you or family members have had with anesthetic medicines. Any blood disorders you have. Any surgeries you have had. Any medical conditions you have. Whether you are pregnant or may be pregnant. What are the risks? Generally, this is a safe procedure. However, problems may occur, including: Infection. Bleeding. Allergic reactions to medicines used during the procedure. Blood clots. Swelling or bruising. Damage to nearby structures or organs, such as nerves, lungs, blood vessels, or the heart where the ICD leads or pulse generator is implanted. What happens before the procedure? Staying hydrated Follow instructions from your health care provider about hydration, which may include: Up to 2 hours before the procedure - you may continue to drink clear liquids, such as water, clear fruit juice, black coffee, and plain tea.  Eating and drinking restrictions Follow instructions from your health care provider about eating and drinking, which may include: 8 hours before the procedure - stop eating heavy meals or foods, such as meat, fried foods, or fatty foods. 6 hours before the procedure - stop eating light meals or foods, such as toast or cereal. 6 hours before the procedure - stop drinking milk or drinks that contain milk. 2 hours before the procedure - stop drinking clear liquids. Medicines Ask your health care provider about: Changing or stopping your regular medicines. This is especially important if you are taking diabetes medicines or blood thinners. Taking medicines such as aspirin and ibuprofen. These medicines can thin your blood. Do not take these medicines unless your health  care provider tells you to take them. Taking over-the-counter medicines, vitamins, herbs, and supplements. Tests You may have an exam or testing. These may include: Blood tests. A test to check the electrical signals in your heart (electrocardiogram, ECG). Imaging tests, such as a chest X-ray. Echocardiogram. This is an ultrasound of your heart to evaluate your heart structures and function. An event monitor or Holter monitor to wear at home. General instructions Do not use any products that contain nicotine or tobacco for at least 4 weeks before the procedure. These products include cigarettes, chewing tobacco, and vaping devices, such as e-cigarettes. If you need help quitting, ask your health care provider. Ask your health care provider: How your procedure site will be marked. What steps will be taken to help prevent infection. These may include: Removing hair at the surgery site. Washing skin with a germ-killing soap. Taking antibiotic medicine. You may be asked to shower with a germ-killing soap. Plan to have a responsible adult take you home from the hospital or clinic. What happens during the procedure?  Small monitors will be put on your body. They will be used to check your heart rate, blood pressure, and oxygen level. A pair of sticky pads (defibrillator pads) may be placed on your back and chest. These pads are able to pace your heart as needed during the procedure. An IV will be inserted into one of your veins. You will be given one or more of the following: A medicine to help you  relax (sedative). A medicine to numb the area (local anesthetic). A medicine to make you fall asleep(general anesthetic). A small incision will be made to create a deep pocket under the skin of your chest or abdomen. Leads will be guided through a blood vessel into your heart and attached to your heart muscles. Depending on the ICD, the leads may go into one ventricle, or they may go into both  ventricles and into an upper chamber of the heart. An X-ray machine (fluoroscope) will be used to help guide the leads. The other end of the leads will be attached to the pulse generator. The pulse generator will be placed into the pocket under the skin. The ICD will be tested, and your health care provider will program the ICD for the condition being treated. The incision will be closed with stitches (sutures), skin glue, adhesive strips, or staples. A bandage (dressing) will be placed over the incision. The procedure may vary among health care providers and hospitals. What happens after the procedure? Your blood pressure, heart rate, breathing rate, and blood oxygen level will be monitored until you leave the hospital or clinic. Your health care provider will also monitor your ICD to make sure it is working properly. A chest X-ray will be taken to check that the ICD is in the right place. Do not raise the arm on the side of your procedure higher than your shoulder for as long as told by your health care provider. This is usually at least 6 weeks. You may be given an identification card explaining that you have an ICD. You will be given a remote home monitoring device to use with your ICD to allow your device to communicate with your clinic. Summary An implantable cardioverter defibrillator (ICD) is a device that identifies and corrects abnormal heart rhythms. Cardioverter defibrillator implantation is a surgery to place an ICD under the skin in the chest or abdomen. An ICD consists of a battery, a small computer (pulse generator), and wires (leads) that go into the heart. During the procedure, the ICD will be tested, and your health care provider will program the ICD for the condition being treated. After the procedure, a chest X-ray will be taken to check that the ICD is in the right place. This information is not intended to replace advice given to you by your health care provider. Make sure you  discuss any questions you have with your health care provider. Document Revised: 04/20/2020 Document Reviewed: 04/20/2020 Elsevier Patient Education  [place].

## 2021-12-13 NOTE — Progress Notes (Signed)
Electrophysiology Office Note:    Date:  12/13/2021   ID:  [name], [name] Mar 26, 1951, MRN [id number]  PCP:  Leone Haven, MD  [facility] Cardiologist:  Sherren Mocha, MD  [facility] Electrophysiologist:  Vickie Epley, MD   Referring MD: Jolaine Artist, MD   Chief Complaint: Chronic systolic heart failure  History of Present Illness:    Melanie Ray is a 71 y.o. female who presents for an evaluation of chronic systolic heart failure at the request of Dr. Ronna Polio. Their medical history includes coronary artery disease, obsessive-compulsive disorder, diabetes, hypertension, hyperlipidemia, left bundle branch block, CVA.  The patient was last seen by Dr. Kae Heller on November 15, 2021.  Her left ventricular function has been persistently reduced.  She was recently admitted with a heart failure exacerbation in December 2022.  There is felt to be a left bundle associated component of her cardiomyopathy given the amount of dyssynchrony noted on cardiac MRI.  She has NYHA class III symptoms.     Past Medical History:  Diagnosis Date   Anemia    Anxiety    Back pain    CAD (coronary artery disease) 04/11/2016   S/p ant STEMI 5/17: LHC >> LAD proximal 80%, mid 80%, distal 50%, ostial D1 60%; LCx with LPDA lesion 30%; RCA Mild calcification with no significant stenosis in a medium caliber, nondominant RCA; LVEF is estimated at 45% with inferoapical and lateral wall akinesis >> PCI: PCI: 3.5 x 24 mm Promus DES to prox LAD, 2.5 x 12 mm Promus DES to mid LAD. // Myoview 7/21: EF 20, no ischemia; high risk    Chest pain    Chronic systolic CHF (congestive heart failure) ([place]) 03/21/2016   Echo 01/30/17: Diff HK, mild focal basal septal hypertrophy, EF 30-35, mild AI, MAC, mild MR // Echo 06/08/16: Mild focal basal septal hypertrophy, EF 25-30%, diff HK, ant-septal AK, Gr 1 DD, mild AI, MAC, mild MR, PASP 37 mmHg // Echo 03/18/16: EF 30-35%, ant-septal AK, Gr 1 DD,  mild MR, severe LAE.   Constipation    Depression    Diabetes mellitus type 2 in obese (HCC)    Dizziness    Glaucoma    Gout    Heart disease    Heartburn    History of acute anterior wall MI 03/17/2016   History of heart attack    Hyperlipemia    Hypertension    Ischemic cardiomyopathy 10/02/2016   Refused ICD   Joint pain    Left bundle branch block    Myocardial infarction (HCC)    Nausea    Positive colorectal cancer screening using Cologuard test 03/01/2020   Sleep apnea    SOB (shortness of breath)    Suicidal ideation 08/27/2018   Swelling    feet or legs    Past Surgical History:  Procedure Laterality Date   APPENDECTOMY     CARDIAC CATHETERIZATION N/A 03/17/2016   Procedure: Left Heart Cath and Coronary Angiography;  Surgeon: Sherren Mocha, MD;  Location: [place] [facility];  Service: Cardiovascular;  Laterality: N/A;   CARDIAC CATHETERIZATION N/A 03/17/2016   Procedure: Coronary Stent Intervention;  Surgeon: Sherren Mocha, MD;  Location: [place] [facility];  Service: Cardiovascular;  Laterality: N/A;   COLONOSCOPY WITH PROPOFOL N/A 08/13/2017   Procedure: COLONOSCOPY WITH PROPOFOL;  Surgeon: Jonathon Bellows, MD;  Location[facility] ENDOSCOPY;  Service: Gastroenterology;  Laterality: N/A;   COLONOSCOPY WITH PROPOFOL N/A 03/29/2020   Procedure:  COLONOSCOPY WITH PROPOFOL;  Surgeon: Lucilla Lame, MD;  Location[facility] ENDOSCOPY;  Service: Endoscopy;  Laterality: N/A;   SMALL BOWEL REPAIR     TONSILLECTOMY     UTERINE FIBROID SURGERY      Current Medications: Current Meds  Medication Sig   atorvastatin (LIPITOR) 80 MG tablet Take 1 tablet by mouth once daily   carvedilol (COREG) 12.5 MG tablet TAKE 1 TABLET BY MOUTH TWICE DAILY WITH MEALS   clopidogrel (PLAVIX) 75 MG tablet Take 1 tablet by mouth once daily   dorzolamide-timolol (COSOPT) 22.3-6.8 MG/ML ophthalmic solution Place 1 drop into both eyes 2 (two) times daily.    furosemide (LASIX) 40 MG tablet Take 40 mg by mouth  daily.   isosorbide mononitrate (IMDUR) 30 MG 24 hr tablet Take 1 tablet (30 mg total) by mouth daily.   latanoprost (XALATAN) 0.005 % ophthalmic solution Place 1 drop into both eyes at bedtime.   LORazepam (ATIVAN) 0.5 MG tablet Take 0.5 mg by mouth daily as needed.   lurasidone (LATUDA) 40 MG TABS tablet Take 40 mg by mouth daily with breakfast.   nitroGLYCERIN (NITROSTAT) 0.4 MG SL tablet DISSOLVE ONE TABLET UNDER THE TONGUE EVERY FIVE MINUTES AS NEEDED FOR CHEST PAIN. DO NOT EXCEED A TOTAL OF THREE DOSES IN 15 MINUTES   spironolactone (ALDACTONE) 25 MG tablet Take half a tablet by mouth once a day   traZODone (DESYREL) 150 MG tablet Take 150 mg by mouth at bedtime.   valsartan (DIOVAN) 80 MG tablet Take 1 tablet (80 mg total) by mouth 2 (two) times daily. (Patient taking differently: Take 80 mg by mouth daily.)   venlafaxine XR (EFFEXOR-XR) 150 MG 24 hr capsule TAKE 2 CAPSULES BY MOUTH ONCE DAILY (NEEDS to follow-up with psychiatry for further refills)     Allergies:   Tetracycline, Meperidine, and Entresto [sacubitril-valsartan]   Social History   Socioeconomic History   Marital status: Married    Spouse name: Aaryanna Hyden   Number of children: 0   Years of education: BS in education   Highest education level: Not on file  Occupational History   Occupation:  Retired Education officer, museum  Tobacco Use   Smoking status: Former    Types: Cigarettes    Quit date: 04/13/1997    Years since quitting: 24.6   Smokeless tobacco: Never  Vaping Use   Vaping Use: Never used  Substance and Sexual Activity   Alcohol use: Not Currently   Drug use: No   Sexual activity: Not Currently  Other Topics Concern   Not on file  Social History Narrative   Lives in [place] with spouse.  No children.   Retired first [employer] for over 30 years ([place] for 10 years and then in [place] for over 20 years).   Left-handed   Lives in a two story home       Social Determinants of Health    Financial Resource Strain: Not on file  Food Insecurity: Not on file  Transportation Needs: Not on file  Physical Activity: Not on file  Stress: Not on file  Social Connections: Not on file     Family History: The patient's family history includes Anxiety disorder in her maternal aunt and mother; Dementia in her mother; Depression in her mother; Diabetes in her mother; Drug abuse in her cousin; High Cholesterol in her father and mother; Hyperlipidemia in her mother; Hypertension in her father and mother; Mood Disorder in her sister; Paranoid behavior in her mother;  Stroke in her sister; Tuberculosis in her paternal grandfather. ° °ROS:   °Please see the history of present illness.    °All other systems reviewed and are negative. ° °EKGs/Labs/Other Studies Reviewed:   ° °The following studies were reviewed today: ° °Mar 31, 2020 echo °Left ventricular function less than 20% °Right ventricular function normal °Moderately dilated left atrium °Moderate MR ° °November 08, 2021 cardiac MRI °Left ventricular function severely reduced, 17% °Dyssynchrony in the LV °Normal RV function °Mild to moderate AI °Moderate MR °No LGE ° °EKG:  The ekg ordered today demonstrates sinus rhythm, left bundle much block ° ° °Recent Labs: °09/05/2021: Pro B Natriuretic peptide (BNP) 1,262.0; TSH 0.64 °11/09/2021: Hemoglobin 11.7; Platelets 352 °11/15/2021: ALT 20; B Natriuretic Peptide 980.3; BUN 18; Creatinine, Ser 1.04; Potassium 3.8; Sodium 138  °Recent Lipid Panel °   °Component Value Date/Time  ° CHOL 112 04/18/2020 0815  ° TRIG 65 04/18/2020 0815  ° HDL 39 (L) 04/18/2020 0815  ° CHOLHDL 2.9 04/18/2020 0815  ° CHOLHDL 3.7 03/31/2020 0514  ° VLDL 15 03/31/2020 0514  ° LDLCALC 59 04/18/2020 0815  ° LDLDIRECT 78.0 02/07/2018 0928  ° ° °Physical Exam:   ° °VS:  BP 130/62 (BP Location: Left Arm, Patient Position: Sitting, Cuff Size: Normal)    Pulse 81    Ht 5' 3" (1.6 m)    Wt 141 lb (64 kg)    SpO2 97%    BMI 24.98 kg/m²    ° °Wt  Readings from Last 3 Encounters:  °12/13/21 141 lb (64 kg)  °11/15/21 143 lb (64.9 kg)  °11/09/21 142 lb 12.8 oz (64.8 kg)  °  ° °GEN: Well nourished, well developed in no acute distress °HEENT: Normal °NECK: No JVD; No carotid bruits °LYMPHATICS: No lymphadenopathy °CARDIAC: RRR, no murmurs, rubs, gallops °RESPIRATORY:  Clear to auscultation without rales, wheezing or rhonchi  °ABDOMEN: Soft, non-tender, non-distended °MUSCULOSKELETAL:  No edema; No deformity  °SKIN: Warm and dry °NEUROLOGIC:  Alert and oriented x 3 °PSYCHIATRIC:  Normal affect  ° ° °  ° °ASSESSMENT:   ° °1. Chronic systolic heart failure (HCC)   °2. Ischemic cardiomyopathy   °3. Left bundle branch block   °4. Pre-op evaluation   ° °PLAN:   ° °In order of problems listed above: ° °#Chronic systolic heart failure °#Coronary artery disease °#Lipid rich plaque ° °The patient has NYHA class III heart failure symptoms.  She is on good medical therapy and follows closely in the heart failure clinic.  I do think there is a component of left bundle associated dyssynchrony contributing to her LV dysfunction.  Given her persistently reduced ejection fraction, I do think she would benefit from an ICD and given left bundle, a CRT-D.  I discussed the procedure in detail with the patient including risk, recovery she wished to proceed. ° °The patient has an ischemic CM (EF 17%), NYHA Class III CHF, and CAD.  He is referred by Dr Bensimhon for risk stratification of sudden death and consideration of ICD implantation.  At this time, she meets criteria for ICD implantation for primary prevention of sudden death.  I have had a thorough discussion with the patient reviewing options.  The patient and their family (if available) have had opportunities to ask questions and have them answered. The patient and I have decided together through a shared decision making process to proceed with ICD implant at this time.   ° °Risks, benefits, alternatives to ICD implantation  were   discussed in detail with the patient today. The patient understands that the risks include but are not limited to bleeding, infection, pneumothorax, perforation, tamponade, vascular damage, renal failure, MI, stroke, death, inappropriate shocks, and lead dislodgement and wishes to proceed.  We will therefore schedule device implantation at the next available time.  She will hold her Plavix for 5 days prior to the implant.  Total time spent with patient today 60 minutes. This includes reviewing records, evaluating the patient and coordinating care.  Medication Adjustments/Labs and Tests Ordered: Current medicines are reviewed at length with the patient today.  Concerns regarding medicines are outlined above.  Orders Placed This Encounter  Procedures   CBC w/Diff   Basic Metabolic Panel (BMET)   EKG 12-Lead   No orders of the defined types were placed in this encounter.    Signed, Hilton Cork. Quentin Ore, MD, [facility], [facility] 12/13/2021 5:13 PM    Electrophysiology [facility]

## 2021-12-14 DIAGNOSIS — S62627D Displaced fracture of medial phalanx of left little finger, subsequent encounter for fracture with routine healing: Secondary | ICD-10-CM | POA: Diagnosis not present

## 2021-12-14 LAB — BASIC METABOLIC PANEL
BUN/Creatinine Ratio: 15 (ref 12–28)
BUN: 15 mg/dL (ref 8–27)
CO2: 20 mmol/L (ref 20–29)
Calcium: 10.2 mg/dL (ref 8.7–10.3)
Chloride: 106 mmol/L (ref 96–106)
Creatinine, Ser: 1 mg/dL (ref 0.57–1.00)
Glucose: 140 mg/dL — ABNORMAL HIGH (ref 70–99)
Potassium: 3.9 mmol/L (ref 3.5–5.2)
Sodium: 142 mmol/L (ref 134–144)
eGFR: 60 mL/min/{1.73_m2} (ref >59)

## 2021-12-14 LAB — CBC WITH DIFFERENTIAL/PLATELET
Basophils Absolute: 0 10*3/uL (ref 0.0–0.2)
Basos: 1 %
EOS (ABSOLUTE): 0.1 10*3/uL (ref 0.0–0.4)
Eos: 1 %
Hematocrit: 36.6 % (ref 34.0–46.6)
Hemoglobin: 11.8 g/dL (ref 11.1–15.9)
Immature Grans (Abs): 0 10*3/uL (ref 0.0–0.1)
Immature Granulocytes: 0 %
Lymphocytes Absolute: 1.4 10*3/uL (ref 0.7–3.1)
Lymphs: 24 %
MCH: 26.4 pg — ABNORMAL LOW (ref 26.6–33.0)
MCHC: 32.2 g/dL (ref 31.5–35.7)
MCV: 82 fL (ref 79–97)
Monocytes Absolute: 0.6 10*3/uL (ref 0.1–0.9)
Monocytes: 10 %
Neutrophils Absolute: 3.7 10*3/uL (ref 1.4–7.0)
Neutrophils: 64 %
Platelets: 288 10*3/uL (ref 150–450)
RBC: 4.47 x10E6/uL (ref 3.77–5.28)
RDW: 13.2 % (ref 11.7–15.4)
WBC: 5.6 10*3/uL (ref 3.4–10.8)

## 2021-12-18 ENCOUNTER — Telehealth: Payer: Self-pay | Admitting: Cardiology

## 2021-12-18 ENCOUNTER — Telehealth: Payer: Self-pay | Admitting: *Deleted

## 2021-12-18 NOTE — Telephone Encounter (Signed)
Advised for new arrival time for procedure on Feb 28, now at 11:30 am.  Verbalized understanding.

## 2021-12-18 NOTE — Telephone Encounter (Signed)
Discussed going home post procedure ideally being same day or out patient procedure. Also keep in mine that if something unforeseen were to occur she may need an overnight stay, but plan is to go home the same day. Also briefly talked about restrictions post procedure for up to 8 weeks.  Verbalized understanding.

## 2021-12-18 NOTE — Telephone Encounter (Signed)
Patient has upcoming procedure and responsible for taking care of spouse and diabetic dog requiring injections.   Patient would like to know if she can plan to go home after upcoming procedure.   Please call to discuss.

## 2021-12-19 ENCOUNTER — Ambulatory Visit (INDEPENDENT_AMBULATORY_CARE_PROVIDER_SITE_OTHER): Payer: Medicare Other | Admitting: Family Medicine

## 2021-12-19 ENCOUNTER — Other Ambulatory Visit: Payer: Self-pay

## 2021-12-19 ENCOUNTER — Encounter: Payer: Self-pay | Admitting: Family Medicine

## 2021-12-19 DIAGNOSIS — S62627D Displaced fracture of medial phalanx of left little finger, subsequent encounter for fracture with routine healing: Secondary | ICD-10-CM | POA: Diagnosis not present

## 2021-12-19 DIAGNOSIS — S62607A Fracture of unspecified phalanx of left little finger, initial encounter for closed fracture: Secondary | ICD-10-CM | POA: Diagnosis not present

## 2021-12-19 DIAGNOSIS — I2510 Atherosclerotic heart disease of native coronary artery without angina pectoris: Secondary | ICD-10-CM

## 2021-12-19 DIAGNOSIS — I255 Ischemic cardiomyopathy: Secondary | ICD-10-CM | POA: Diagnosis not present

## 2021-12-19 DIAGNOSIS — E119 Type 2 diabetes mellitus without complications: Secondary | ICD-10-CM

## 2021-12-19 DIAGNOSIS — I5022 Chronic systolic (congestive) heart failure: Secondary | ICD-10-CM

## 2021-12-19 DIAGNOSIS — I10 Essential (primary) hypertension: Secondary | ICD-10-CM

## 2021-12-19 DIAGNOSIS — S62609A Fracture of unspecified phalanx of unspecified finger, initial encounter for closed fracture: Secondary | ICD-10-CM | POA: Insufficient documentation

## 2021-12-19 DIAGNOSIS — E782 Mixed hyperlipidemia: Secondary | ICD-10-CM

## 2021-12-19 LAB — CBC
HCT: 33 % — ABNORMAL LOW (ref 36.0–46.0)
Hemoglobin: 11 g/dL — ABNORMAL LOW (ref 12.0–15.0)
MCHC: 33.2 g/dL (ref 30.0–36.0)
MCV: 79.9 fl (ref 78.0–100.0)
Platelets: 262 10*3/uL (ref 150.0–400.0)
RBC: 4.13 Mil/uL (ref 3.87–5.11)
RDW: 14.6 % (ref 11.5–15.5)
WBC: 6.1 10*3/uL (ref 4.0–10.5)

## 2021-12-19 LAB — HEMOGLOBIN A1C: Hgb A1c MFr Bld: 5.9 % (ref 4.6–6.5)

## 2021-12-19 LAB — BRAIN NATRIURETIC PEPTIDE: Pro B Natriuretic peptide (BNP): 2342 pg/mL — ABNORMAL HIGH (ref 0.0–100.0)

## 2021-12-19 NOTE — Assessment & Plan Note (Signed)
Slightly elevated though improved on recheck.  Discussed checking at home.  She will continue carvedilol, Imdur, spironolactone, and valsartan.  I encouraged her to take her Lasix consistently.

## 2021-12-19 NOTE — Assessment & Plan Note (Signed)
Check A1c.  I encouraged healthy diet.

## 2021-12-19 NOTE — Assessment & Plan Note (Signed)
Check lipid panel.  She will continue Lipitor.

## 2021-12-19 NOTE — Patient Instructions (Signed)
Nice to see you. Please consistently take your Lasix. If your shortness of breath significantly worsens please go to the emergency room.

## 2021-12-19 NOTE — Progress Notes (Signed)
Tommi Rumps, MD Phone: [phone]  Melanie Ray is a 71 y.o. female who presents today for f/u.  HYPERTENSION/CAD/HLD Disease Monitoring Home BP Monitoring not checking Chest pain- no    Dyspnea- yes, chronic though worsened over the past couple of weeks.  Orthopnea- yes, recently PND- yes, recently  Medications Compliance-  taking lipitor, coreg, plavix, lasix (not consistently), imdur, spironolactone, valsartan.  Edema- no ICD to be placed later this month. She discussed her dyspnea with cardiology. She states they felt the ICD would help with her symptoms.  BMET    Component Value Date/Time   NA 142 12/13/2021 1235   K 3.9 12/13/2021 1235   CL 106 12/13/2021 1235   CO2 20 12/13/2021 1235   GLUCOSE 140 (H) 12/13/2021 1235   GLUCOSE 89 11/15/2021 1504   BUN 15 12/13/2021 1235   CREATININE 1.00 12/13/2021 1235   CREATININE 1.12 (H) 10/16/2016 1412   CALCIUM 10.2 12/13/2021 1235   GFRNONAA 57 (L) 11/15/2021 1504   GFRAA 58 (L) 11/30/2020 1506   Diabetes: no exercise. She has been doing a diet with powdered foods and then eats one meat and one vegetable daily.   Left fifth finger fracture: Patient has seen orthopedics for this.  She had surgery and was in a cast.  She reports it is progressively improving.  She states she fell and broke the finger.  Social History   Tobacco Use  Smoking Status Former   Types: Cigarettes   Quit date: 04/13/1997   Years since quitting: 24.7  Smokeless Tobacco Never    Current Outpatient Medications on File Prior to Visit  Medication Sig Dispense Refill   atorvastatin (LIPITOR) 80 MG tablet Take 1 tablet by mouth once daily 90 tablet 3   carvedilol (COREG) 12.5 MG tablet TAKE 1 TABLET BY MOUTH TWICE DAILY WITH MEALS 180 tablet 1   clopidogrel (PLAVIX) 75 MG tablet Take 1 tablet by mouth once daily 90 tablet 0   dorzolamide-timolol (COSOPT) 22.3-6.8 MG/ML ophthalmic solution Place 1 drop into both eyes 2 (two) times daily.       furosemide (LASIX) 40 MG tablet Take 40 mg by mouth daily.     isosorbide mononitrate (IMDUR) 30 MG 24 hr tablet Take 1 tablet (30 mg total) by mouth daily. 90 tablet 3   latanoprost (XALATAN) 0.005 % ophthalmic solution Place 1 drop into both eyes at bedtime.     LORazepam (ATIVAN) 0.5 MG tablet Take 0.5 mg by mouth daily as needed.     lurasidone (LATUDA) 40 MG TABS tablet Take 40 mg by mouth daily with breakfast.     nitroGLYCERIN (NITROSTAT) 0.4 MG SL tablet DISSOLVE ONE TABLET UNDER THE TONGUE EVERY FIVE MINUTES AS NEEDED FOR CHEST PAIN. DO NOT EXCEED A TOTAL OF THREE DOSES IN 15 MINUTES 25 tablet 4   spironolactone (ALDACTONE) 25 MG tablet Take half a tablet by mouth once a day 45 tablet 3   traZODone (DESYREL) 150 MG tablet Take 150 mg by mouth at bedtime.     valsartan (DIOVAN) 80 MG tablet Take 1 tablet (80 mg total) by mouth 2 (two) times daily. (Patient taking differently: Take 80 mg by mouth daily.) 180 tablet 3   venlafaxine XR (EFFEXOR-XR) 150 MG 24 hr capsule TAKE 2 CAPSULES BY MOUTH ONCE DAILY (NEEDS to follow-up with psychiatry for further refills) 60 capsule 0   No current facility-administered medications on file prior to visit.     ROS see history of present illness  Objective  Physical Exam Vitals:   12/19/21 1046 12/19/21 1055  BP: (!) 150/80 140/80  Pulse: 74   Temp: 98.5 F (36.9 C)   SpO2: 95%     BP Readings from Last 3 Encounters:  12/19/21 140/80  12/13/21 130/62  11/15/21 126/64   Wt Readings from Last 3 Encounters:  12/19/21 142 lb 12.8 oz (64.8 kg)  12/13/21 141 lb (64 kg)  11/15/21 143 lb (64.9 kg)    Physical Exam Constitutional:      General: She is not in acute distress.    Appearance: She is not diaphoretic.  Cardiovascular:     Rate and Rhythm: Normal rate and regular rhythm.     Heart sounds: Normal heart sounds.  Pulmonary:     Effort: Pulmonary effort is normal.     Breath sounds: Normal breath sounds.  Musculoskeletal:      Comments: Well-healed scar over the dorsum of her left fifth finger, left fifth finger is nontender  Skin:    General: Skin is warm and dry.  Neurological:     Mental Status: She is alert.     Assessment/Plan: Please see individual problem list.  Problem List Items Addressed This Visit     CAD (coronary artery disease)    Continue risk factor management.      Chronic systolic CHF (congestive heart failure) ([place])    She may be in the midst of a mild CHF exacerbation given her shortness of breath and PND and orthopnea.  We will check a BMP today.  I encouraged her to consistently take her Lasix.  We will check other labs to evaluate for cause of her dyspnea.  She will seek medical attention if she develops significantly worsening shortness of breath.      Relevant Orders   B Nat Peptide   CBC   DM type 2 (diabetes mellitus, type 2) (HCC)    Check A1c.  I encouraged healthy diet.      Relevant Orders   HgB A1c   Essential hypertension    Slightly elevated though improved on recheck.  Discussed checking at home.  She will continue carvedilol, Imdur, spironolactone, and valsartan.  I encouraged her to take her Lasix consistently.      Relevant Orders   Comp Met (CMET)   Finger fracture    She reports this is well-healing.  She will continue to see orthopedics.      HLD (hyperlipidemia)    Check lipid panel.  She will continue Lipitor.      Relevant Orders   Comp Met (CMET)   Lipid panel     Health Maintenance: Discussed pneumonia vaccine.  She opted to defer this until she was feeling better.  Return in about 3 months (around 03/18/2022).  This visit occurred during the SARS-CoV-2 public health emergency.  Safety protocols were in place, including screening questions prior to the visit, additional usage of staff PPE, and extensive cleaning of exam room while observing appropriate contact time as indicated for disinfecting solutions.    Tommi Rumps, MD [place]

## 2021-12-19 NOTE — Assessment & Plan Note (Signed)
She reports this is well-healing.  She will continue to see orthopedics.

## 2021-12-19 NOTE — Assessment & Plan Note (Signed)
Continue risk factor management. 

## 2021-12-19 NOTE — Assessment & Plan Note (Addendum)
She may be in the midst of a mild CHF exacerbation given her shortness of breath and PND and orthopnea.  We will check a BMP today.  I encouraged her to consistently take her Lasix.  We will check other labs to evaluate for cause of her dyspnea.  She will seek medical attention if she develops significantly worsening shortness of breath.

## 2021-12-20 ENCOUNTER — Other Ambulatory Visit: Payer: Self-pay | Admitting: Family Medicine

## 2021-12-20 DIAGNOSIS — D649 Anemia, unspecified: Secondary | ICD-10-CM

## 2021-12-20 DIAGNOSIS — Z5181 Encounter for therapeutic drug level monitoring: Secondary | ICD-10-CM

## 2021-12-20 LAB — LIPID PANEL
Cholesterol: 139 mg/dL (ref 0–200)
HDL: 41.6 mg/dL (ref >39.00)
LDL Cholesterol: 83 mg/dL (ref 0–99)
NonHDL: 97.44
Total CHOL/HDL Ratio: 3
Triglycerides: 71 mg/dL (ref 0.0–149.0)
VLDL: 14.2 mg/dL (ref 0.0–40.0)

## 2021-12-20 LAB — COMPREHENSIVE METABOLIC PANEL
ALT: 126 U/L — ABNORMAL HIGH (ref 0–35)
AST: 88 U/L — ABNORMAL HIGH (ref 0–37)
Albumin: 4 g/dL (ref 3.5–5.2)
Alkaline Phosphatase: 157 U/L — ABNORMAL HIGH (ref 39–117)
BUN: 21 mg/dL (ref 6–23)
CO2: 24 mEq/L (ref 19–32)
Calcium: 9.8 mg/dL (ref 8.4–10.5)
Chloride: 107 mEq/L (ref 96–112)
Creatinine, Ser: 0.98 mg/dL (ref 0.40–1.20)
GFR: 58.01 mL/min — ABNORMAL LOW (ref >60.00)
Glucose, Bld: 100 mg/dL — ABNORMAL HIGH (ref 70–99)
Potassium: 4.1 mEq/L (ref 3.5–5.1)
Sodium: 139 mEq/L (ref 135–145)
Total Bilirubin: 0.7 mg/dL (ref 0.2–1.2)
Total Protein: 6.1 g/dL (ref 6.0–8.3)

## 2021-12-25 DIAGNOSIS — Z79899 Other long term (current) drug therapy: Secondary | ICD-10-CM | POA: Diagnosis not present

## 2021-12-25 DIAGNOSIS — F251 Schizoaffective disorder, depressive type: Secondary | ICD-10-CM | POA: Diagnosis not present

## 2021-12-26 ENCOUNTER — Other Ambulatory Visit (INDEPENDENT_AMBULATORY_CARE_PROVIDER_SITE_OTHER): Payer: Medicare Other

## 2021-12-26 ENCOUNTER — Other Ambulatory Visit: Payer: Self-pay

## 2021-12-26 DIAGNOSIS — D649 Anemia, unspecified: Secondary | ICD-10-CM

## 2021-12-26 DIAGNOSIS — Z5181 Encounter for therapeutic drug level monitoring: Secondary | ICD-10-CM

## 2021-12-27 LAB — CBC
HCT: 35.7 % — ABNORMAL LOW (ref 36.0–46.0)
Hemoglobin: 11.9 g/dL — ABNORMAL LOW (ref 12.0–15.0)
MCHC: 33.3 g/dL (ref 30.0–36.0)
MCV: 80.3 fl (ref 78.0–100.0)
Platelets: 322 10*3/uL (ref 150.0–400.0)
RBC: 4.45 Mil/uL (ref 3.87–5.11)
RDW: 14.8 % (ref 11.5–15.5)
WBC: 7.6 10*3/uL (ref 4.0–10.5)

## 2021-12-27 LAB — BASIC METABOLIC PANEL
BUN: 21 mg/dL (ref 6–23)
CO2: 28 mEq/L (ref 19–32)
Calcium: 9.7 mg/dL (ref 8.4–10.5)
Chloride: 101 mEq/L (ref 96–112)
Creatinine, Ser: 1.21 mg/dL — ABNORMAL HIGH (ref 0.40–1.20)
GFR: 45.04 mL/min — ABNORMAL LOW (ref >60.00)
Glucose, Bld: 82 mg/dL (ref 70–99)
Potassium: 3.7 mEq/L (ref 3.5–5.1)
Sodium: 137 mEq/L (ref 135–145)

## 2021-12-28 DIAGNOSIS — S62627D Displaced fracture of medial phalanx of left little finger, subsequent encounter for fracture with routine healing: Secondary | ICD-10-CM | POA: Diagnosis not present

## 2022-01-01 NOTE — Pre-Procedure Instructions (Signed)
Attempted to call patient regarding procedure, no answer.

## 2022-01-02 ENCOUNTER — Ambulatory Visit (HOSPITAL_COMMUNITY)
Admission: RE | Admit: 2022-01-02 | Discharge: 2022-01-03 | Disposition: A | Discharge status: Home / Self Care | Payer: Medicare Other | Attending: Cardiology | Admitting: Cardiology

## 2022-01-02 ENCOUNTER — Encounter (HOSPITAL_COMMUNITY)
Admission: RE | Disposition: A | Discharge status: Home / Self Care | Payer: Medicare Other | Source: Home / Self Care | Attending: Cardiology

## 2022-01-02 ENCOUNTER — Other Ambulatory Visit: Payer: Self-pay

## 2022-01-02 DIAGNOSIS — F429 Obsessive-compulsive disorder, unspecified: Secondary | ICD-10-CM | POA: Insufficient documentation

## 2022-01-02 DIAGNOSIS — I428 Other cardiomyopathies: Secondary | ICD-10-CM | POA: Diagnosis not present

## 2022-01-02 DIAGNOSIS — Z87891 Personal history of nicotine dependence: Secondary | ICD-10-CM | POA: Diagnosis not present

## 2022-01-02 DIAGNOSIS — E119 Type 2 diabetes mellitus without complications: Secondary | ICD-10-CM | POA: Insufficient documentation

## 2022-01-02 DIAGNOSIS — Z95810 Presence of automatic (implantable) cardiac defibrillator: Secondary | ICD-10-CM

## 2022-01-02 DIAGNOSIS — E785 Hyperlipidemia, unspecified: Secondary | ICD-10-CM | POA: Insufficient documentation

## 2022-01-02 DIAGNOSIS — I110 Hypertensive heart disease with heart failure: Secondary | ICD-10-CM | POA: Insufficient documentation

## 2022-01-02 DIAGNOSIS — I5022 Chronic systolic (congestive) heart failure: Secondary | ICD-10-CM | POA: Diagnosis not present

## 2022-01-02 DIAGNOSIS — Z8673 Personal history of transient ischemic attack (TIA), and cerebral infarction without residual deficits: Secondary | ICD-10-CM | POA: Diagnosis not present

## 2022-01-02 DIAGNOSIS — I447 Left bundle-branch block, unspecified: Secondary | ICD-10-CM | POA: Diagnosis not present

## 2022-01-02 DIAGNOSIS — I2510 Atherosclerotic heart disease of native coronary artery without angina pectoris: Secondary | ICD-10-CM | POA: Insufficient documentation

## 2022-01-02 HISTORY — DX: Presence of automatic (implantable) cardiac defibrillator: Z95.810

## 2022-01-02 HISTORY — PX: BIV ICD INSERTION CRT-D: EP1195

## 2022-01-02 LAB — GLUCOSE, CAPILLARY: Glucose-Capillary: 77 mg/dL (ref 70–99)

## 2022-01-02 SURGERY — BIV ICD INSERTION CRT-D

## 2022-01-02 MED ORDER — POVIDONE-IODINE 10 % EX SWAB
2.0000 "application " | Freq: Once | CUTANEOUS | Status: AC
  Administered 2022-01-02: 2 via TOPICAL

## 2022-01-02 MED ORDER — ONDANSETRON HCL 4 MG/2ML IJ SOLN: 4.0000 mg | Freq: Four times a day (QID) | INTRAMUSCULAR | Status: DC | PRN

## 2022-01-02 MED ORDER — HEPARIN (PORCINE) IN NACL 1000-0.9 UT/500ML-% IV SOLN
INTRAVENOUS | Status: DC | PRN
  Administered 2022-01-02: 500 mL

## 2022-01-02 MED ORDER — IRBESARTAN 150 MG PO TABS: 75.0000 mg | ORAL_TABLET | Freq: Every day | ORAL | Status: DC

## 2022-01-02 MED ORDER — SODIUM CHLORIDE 0.9 % IV SOLN
80.0000 mg | INTRAVENOUS | Status: AC
  Administered 2022-01-02: 80 mg

## 2022-01-02 MED ORDER — SPIRONOLACTONE 12.5 MG HALF TABLET
12.5000 mg | ORAL_TABLET | Freq: Once | ORAL | Status: DC
  Filled 2022-01-02: qty 1

## 2022-01-02 MED ORDER — ISOSORBIDE MONONITRATE ER 30 MG PO TB24: 30.0000 mg | ORAL_TABLET | Freq: Every day | ORAL | Status: DC

## 2022-01-02 MED ORDER — HEPARIN (PORCINE) IN NACL 2000-0.9 UNIT/L-% IV SOLN
INTRAVENOUS | Status: AC
  Filled 2022-01-02: qty 1000

## 2022-01-02 MED ORDER — HEPARIN (PORCINE) IN NACL 1000-0.9 UT/500ML-% IV SOLN
INTRAVENOUS | Status: AC
  Filled 2022-01-02: qty 500

## 2022-01-02 MED ORDER — IOHEXOL 350 MG/ML SOLN
INTRAVENOUS | Status: DC | PRN
  Administered 2022-01-02: 23 mL

## 2022-01-02 MED ORDER — LIDOCAINE HCL (PF) 1 % IJ SOLN
INTRAMUSCULAR | Status: AC
  Filled 2022-01-02: qty 30

## 2022-01-02 MED ORDER — MIDAZOLAM HCL 5 MG/5ML IJ SOLN
INTRAMUSCULAR | Status: AC
  Filled 2022-01-02: qty 5

## 2022-01-02 MED ORDER — ACETAMINOPHEN 325 MG PO TABS
ORAL_TABLET | ORAL | Status: AC
  Filled 2022-01-02: qty 2

## 2022-01-02 MED ORDER — CEFAZOLIN SODIUM-DEXTROSE 2-4 GM/100ML-% IV SOLN
2.0000 g | INTRAVENOUS | Status: AC
  Administered 2022-01-02: 2 g via INTRAVENOUS

## 2022-01-02 MED ORDER — SODIUM CHLORIDE 0.9 % IV SOLN
INTRAVENOUS | Status: AC
  Filled 2022-01-02: qty 2

## 2022-01-02 MED ORDER — ACETAMINOPHEN 325 MG PO TABS
325.0000 mg | ORAL_TABLET | ORAL | Status: DC | PRN
  Administered 2022-01-02 (×2): 650 mg via ORAL
  Filled 2022-01-02: qty 2

## 2022-01-02 MED ORDER — MIDAZOLAM HCL 5 MG/5ML IJ SOLN
INTRAMUSCULAR | Status: DC | PRN
  Administered 2022-01-02 (×3): 1 mg via INTRAVENOUS

## 2022-01-02 MED ORDER — CHLORHEXIDINE GLUCONATE 4 % EX LIQD: 4.0000 "application " | Freq: Once | CUTANEOUS | Status: DC

## 2022-01-02 MED ORDER — LIDOCAINE HCL (PF) 1 % IJ SOLN
INTRAMUSCULAR | Status: DC | PRN
  Administered 2022-01-02: 60 mL

## 2022-01-02 MED ORDER — CARVEDILOL 12.5 MG PO TABS: 12.5000 mg | ORAL_TABLET | Freq: Two times a day (BID) | ORAL | Status: DC

## 2022-01-02 MED ORDER — CEFAZOLIN SODIUM-DEXTROSE 2-4 GM/100ML-% IV SOLN
INTRAVENOUS | Status: AC
  Filled 2022-01-02: qty 100

## 2022-01-02 MED ORDER — FUROSEMIDE 40 MG PO TABS: 40.0000 mg | ORAL_TABLET | Freq: Every day | ORAL | Status: DC

## 2022-01-02 MED ORDER — SODIUM CHLORIDE 0.9 % IV SOLN: INTRAVENOUS | Status: DC

## 2022-01-02 SURGICAL SUPPLY — 19 items
CABLE SURGICAL S-101-97-12 (CABLE) ×2 IMPLANT
CATH ATTAIN COM SURV 6250V-EH (CATHETERS) ×1 IMPLANT
CATH ATTAIN COM SURV 6250V-MB2 (CATHETERS) ×1 IMPLANT
CATH CPS DIRECT 115 DS2C019 (CATHETERS) ×1 IMPLANT
CATH CPS QUART CN DS2N029-65 (CATHETERS) ×1 IMPLANT
CATH DYNAMIC STEER 6FR REP (CATHETERS) ×1 IMPLANT
CATH RIGHTSITE C315HIS02 (CATHETERS) ×1 IMPLANT
ICD UNIFY ASUR CRT CD3357-40Q (ICD Generator) ×1 IMPLANT
LEAD DURATA 7122Q-58CM (Lead) ×1 IMPLANT
LEAD SELECT SECURE 3830 383069 (Lead) IMPLANT
LEAD TENDRIL MRI 52CM LPA1200M (Lead) ×1 IMPLANT
PAD DEFIB RADIO PHYSIO CONN (PAD) ×2 IMPLANT
SELECT SECURE 3830 383069 (Lead) ×2 IMPLANT
SHEATH 7FR PRELUDE SNAP 13 (SHEATH) ×1 IMPLANT
SHEATH 8FR PRELUDE SNAP 13 (SHEATH) ×1 IMPLANT
SHEATH 9.5FR PRELUDE SNAP 13 (SHEATH) ×1 IMPLANT
SLITTER 6232ADJ (MISCELLANEOUS) ×1 IMPLANT
TRAY PACEMAKER INSERTION (PACKS) ×2 IMPLANT
WIRE HI TORQ VERSACORE-J 145CM (WIRE) ×1 IMPLANT

## 2022-01-02 NOTE — Discharge Instructions (Addendum)
° ° °  Supplemental Discharge Instructions for  Pacemaker/Defibrillator Patients   Activity No heavy lifting or vigorous activity with your left/right arm for 6 to 8 weeks.  Do not raise your left/right arm above your head for one week.  Gradually raise your affected arm as drawn below.             01/07/22                      01/08/22                      01/09/22                    01/10/22 __  NO DRIVING for  1 week   ; you may begin driving on   06/09/15 .  WOUND CARE Keep the wound area clean and dry.  Do not get this area wet , no showers for one week; you may shower on   01/10/22  . The tape/steri-strips on your wound will fall off; do not pull them off.  No bandage is needed on the site.  DO  NOT apply any creams, oils, or ointments to the wound area. If you notice any drainage or discharge from the wound, any swelling or bruising at the site, or you develop a fever > 101? F after you are discharged home, call the office at once.  Special Instructions You are still able to use cellular telephones; use the ear opposite the side where you have your pacemaker/defibrillator.  Avoid carrying your cellular phone near your device. When traveling through airports, show security personnel your identification card to avoid being screened in the metal detectors.  Ask the security personnel to use the hand wand. Avoid arc welding equipment, MRI testing (magnetic resonance imaging), TENS units (transcutaneous nerve stimulators).  Call the office for questions about other devices. Avoid electrical appliances that are in poor condition or are not properly grounded. Microwave ovens are safe to be near or to operate.  Additional information for defibrillator patients should your device go off: If your device goes off ONCE and you feel fine afterward, notify the device clinic nurses. If your device goes off ONCE and you do not feel well afterward, call 911. If your device goes off TWICE, call 911. If your  device goes off THREE times in one day, call 911.  DO NOT DRIVE YOURSELF OR A FAMILY MEMBER WITH A DEFIBRILLATOR TO THE HOSPITAL--CALL 911.

## 2022-01-02 NOTE — Interval H&P Note (Signed)
History and Physical Interval Note:  01/02/2022 1:39 PM  Melanie Ray  has presented today for surgery, with the diagnosis of cardiomyopathy.  The various methods of treatment have been discussed with the patient and family. After consideration of risks, benefits and other options for treatment, the patient has consented to  Procedure(s): BIV ICD INSERTION CRT-D (N/A) as a surgical intervention.  The patient's history has been reviewed, patient examined, no change in status, stable for surgery.  I have reviewed the patient's chart and labs.  Questions were answered to the patient's satisfaction.     Ineta Sinning T Kla Bily

## 2022-01-03 ENCOUNTER — Ambulatory Visit (HOSPITAL_COMMUNITY): Payer: Medicare Other

## 2022-01-03 ENCOUNTER — Encounter (HOSPITAL_COMMUNITY): Payer: Self-pay | Admitting: Cardiology

## 2022-01-03 DIAGNOSIS — F429 Obsessive-compulsive disorder, unspecified: Secondary | ICD-10-CM | POA: Diagnosis not present

## 2022-01-03 DIAGNOSIS — Z8673 Personal history of transient ischemic attack (TIA), and cerebral infarction without residual deficits: Secondary | ICD-10-CM | POA: Diagnosis not present

## 2022-01-03 DIAGNOSIS — I110 Hypertensive heart disease with heart failure: Secondary | ICD-10-CM | POA: Diagnosis not present

## 2022-01-03 DIAGNOSIS — I447 Left bundle-branch block, unspecified: Secondary | ICD-10-CM | POA: Diagnosis not present

## 2022-01-03 DIAGNOSIS — Z87891 Personal history of nicotine dependence: Secondary | ICD-10-CM | POA: Diagnosis not present

## 2022-01-03 DIAGNOSIS — I2510 Atherosclerotic heart disease of native coronary artery without angina pectoris: Secondary | ICD-10-CM | POA: Diagnosis not present

## 2022-01-03 DIAGNOSIS — Z95810 Presence of automatic (implantable) cardiac defibrillator: Secondary | ICD-10-CM | POA: Diagnosis not present

## 2022-01-03 DIAGNOSIS — E119 Type 2 diabetes mellitus without complications: Secondary | ICD-10-CM | POA: Diagnosis not present

## 2022-01-03 DIAGNOSIS — I5022 Chronic systolic (congestive) heart failure: Secondary | ICD-10-CM | POA: Diagnosis not present

## 2022-01-03 DIAGNOSIS — E785 Hyperlipidemia, unspecified: Secondary | ICD-10-CM | POA: Diagnosis not present

## 2022-01-03 DIAGNOSIS — I428 Other cardiomyopathies: Secondary | ICD-10-CM | POA: Diagnosis not present

## 2022-01-03 IMAGING — CR DG CHEST 2V
2 series · 2 of 2 positions shown · non-contrast
Comparison: Chest radiograph [DATE]

CLINICAL DATA: Defibrillator in place

EXAM:
CHEST - 2 VIEW

[chest pa]
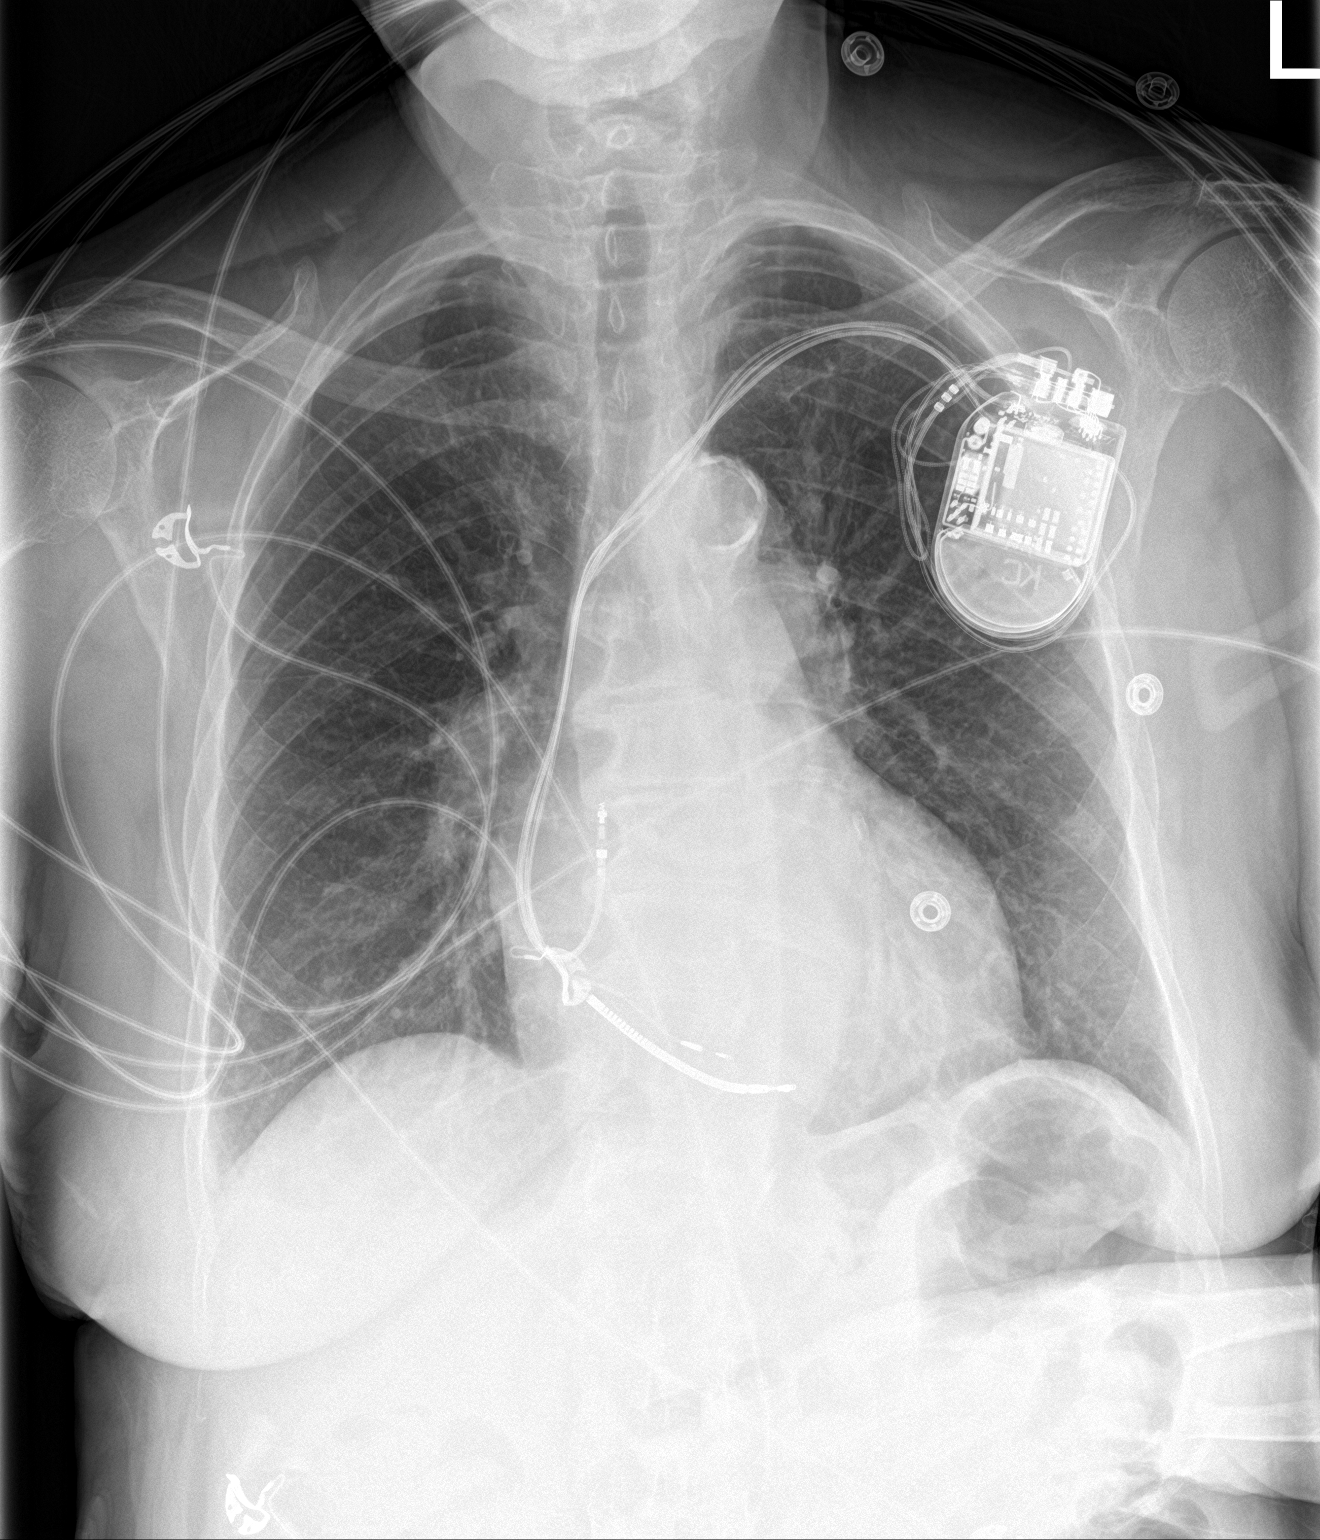

[chest lat]
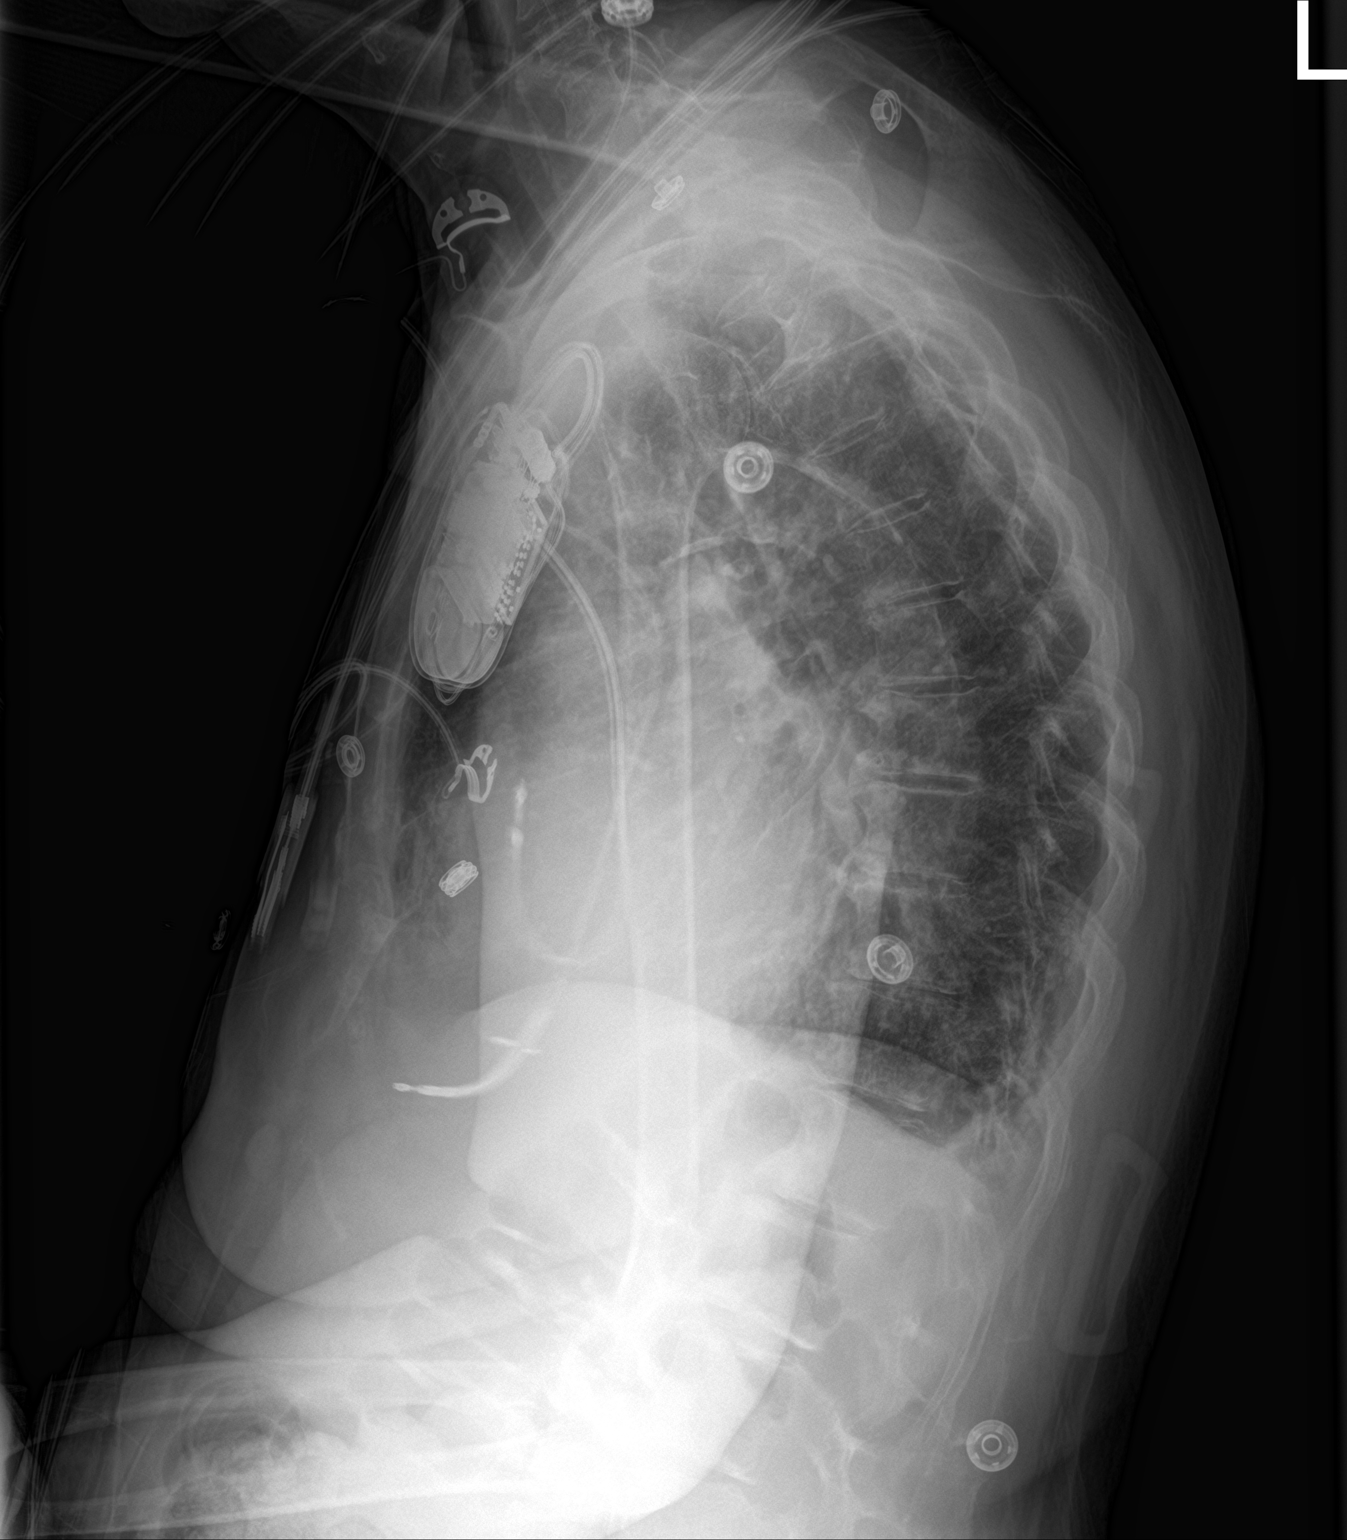

[2 of 2 positions shown; findings below may reference images not displayed]

FINDINGS: There is a new left chest wall cardiac device with leads terminating
over the expected locations of the right atrium and right ventricle.

The cardiomediastinal silhouette is stable.

The lungs are clear, with no focal consolidation or pulmonary edema.
There is no pleural effusion or pneumothorax. Previously seen
opacities in the lung bases on the study from [DATE] have
resolved.

There is no acute osseous abnormality.
IMPRESSION: 1. New left chest wall cardiac device.  No pneumothorax.
2. Clear lungs. Previously seen opacities in the lung bases have
resolved.

## 2022-01-03 NOTE — Progress Notes (Signed)
RN went over d/c summary with pt and removed PIV. Belongings with pt. SWOT RN transporting pt to private vehicle where pt's friend is transporting pt home. ?

## 2022-01-03 NOTE — TOC Transition Note (Signed)
Transition of Care (TOC) - CM/SW Discharge Note ? ? ?Patient Details  ?Name: Melanie Ray ?MRN: [id number] ?Date of Birth: May 31, 1951 ? ?Transition of Care (TOC) CM/SW Contact:  ?Angelita Ingles, RN ?Phone Number:[phone] ? ?01/03/2022, 9:27 AM ? ? ?Clinical Narrative:    ?Patient has d/c orders. No TOC needs noted. ? ? ?Final next level of care: Home/Self Care ?Barriers to Discharge: No Barriers Identified ? ? ?Patient Goals and CMS Choice ?  ?  ?  ? ?Discharge Placement ?  ?           ?  ?  ?  ?  ? ?Discharge Plan and Services ?  ?  ?           ?DME Arranged: N/A ?DME Agency: NA ?  ?  ?  ?HH Arranged: NA ?[place] Agency: NA ?  ?  ?  ? ?Social Determinants of Health (SDOH) Interventions ?  ? ? ?Readmission Risk Interventions ?No flowsheet data found. ? ? ? ? ?

## 2022-01-03 NOTE — Discharge Summary (Signed)
? ? ? ?ELECTROPHYSIOLOGY PROCEDURE DISCHARGE SUMMARY  ? ? ?Patient ID: Melanie Ray,  ?MRN: [id number], DOB/AGE: July 13, 1951 71 y.o. ? ?Admit date: 01/02/2022 ?Discharge date: 01/03/2022 ? ?Primary Care Physician: Leone Haven, MD ?Primary Cardiologist: Dr. Haroldine Laws ?Electrophysiologist: Dr. Eino Farber ? ?Primary Discharge Diagnosis:  ?ICM ?Chronic CHF ?LBBB ? ?Secondary Discharge Diagnosis:  ?CAD (PCI 2017) ?DM ?HTN ?HLD ?Stroke ?Obsessive Compulsive d/o ? ?Allergies  ?Allergen Reactions  ? Tetracycline Swelling  ? Meperidine Nausea And Vomiting  ?  Nausea  ? Entresto [Sacubitril-Valsartan] Other (See Comments)  ?  Shortness of Breath  ? ? ? ?Procedures This Admission:  ?1.  Implantation of a Abbott CRT-D (LB lead) on 01/02/22 by Dr Quentin Ore.  The patient received atrial lead (model Tendril MRI, serial [id number]), 3830-lead was advanced (serial number [id number]) LB position), single coil right ventricular lead (model Durata [id number], serial [id number]),  Celso Amy generator (serial [id number]).  ?DFT's were deferred at time of implant.  There were no immediate post procedure complications. ?2.  CXR on 01/03/22 demonstrated no pneumothorax status post device implantation.  ? ?Brief HPI: ?Matisha Termine is a 71 y.o. female PMHx includes above was referred to electrophysiology in the outpatient setting for consideration of ICD implantation.  The patient has persistent LV dysfunction despite guideline directed therapy.  Risks, benefits, and alternatives to ICD implantation were reviewed with the patient who wished to proceed.  ? ?Hospital Course:  ?The patient was admitted and underwent implantation of a a CRT-D, unable to obtain CS access/lead position and Left Bundle lead was placed (RV lead pacing [name] programmed subthreshold except for post shock pacing capacity) with details as outlined above. She was monitored on telemetry overnight which demonstrated AV pacing.  Left chest was without hematoma or  ecchymosis.  The device was interrogated and found to be functioning normally.  CXR was obtained and demonstrated no pneumothorax status post device implantation.  Wound care, arm mobility, and restrictions were reviewed with the patient.  The patient feels well, denies any CP or SOB, minimal site discomfort, she was examined by Dr. Quentin Ore and considered stable for discharge to home.  ? ?The patient's discharge medications include an ACE/ARB (valsartan) and beta blocker (carvedilol).  ? ?No plavix 5 days ? ?Physical Exam: ?Vitals:  ? 01/03/22 0003 01/03/22 0300 01/03/22 0716 01/03/22 0809  ?BP: (!) 152/82 (!) 164/68 (!) 155/88 (!) 152/70  ?Pulse: 77 71 (!) 105 98  ?Resp: 20 15 15 16   ?Temp: 98 ?F (36.7 ?C) 98 ?F (36.7 ?C) 98.6 ?F (37 ?C) 98.5 ?F (36.9 ?C)  ?TempSrc: Oral Oral Oral Oral  ?SpO2: 95% 93% 96%   ?Weight:      ?Height:      ? ? ?GEN- The patient is well appearing, alert and oriented x 3 today.   ?HEENT: normocephalic, atraumatic; sclera clear, conjunctiva pink; hearing intact; oropharynx clear ?Lungs- CTA b/l, normal work of breathing.  No wheezes, rales, rhonchi ?Heart- RRR, no murmurs, rubs or gallops, PMI not laterally displaced ?GI- soft, non-tender, non-distended ?Extremities- no clubbing, cyanosis, or edema ?MS- no significant deformity or atrophy ?Skin- warm and dry, no rash or lesion, left chest without hematoma/ecchymosis ?Psych- euthymic mood, full affect ?Neuro- no gross defecits ? ?Labs: ?  ?Lab Results  ?Component Value Date  ? WBC 7.6 12/26/2021  ? HGB 11.9 (L) 12/26/2021  ? HCT 35.7 (L) 12/26/2021  ? MCV 80.3 12/26/2021  ? PLT 322.0 12/26/2021  ? No results for input(s): NA, K, CL, CO2,  BUN, CREATININE, CALCIUM, PROT, BILITOT, ALKPHOS, ALT, AST, GLUCOSE in the last 168 hours. ? ?Invalid input(s): LABALBU ? ?Discharge Medications:  ?Allergies as of 01/03/2022   ? ?   Reactions  ? Tetracycline Swelling  ? Meperidine Nausea And Vomiting  ? Nausea  ? Entresto [sacubitril-valsartan] Other (See  Comments)  ? Shortness of Breath  ? ?  ? ?  ?Medication List  ?  ? ?TAKE these medications   ? ?atorvastatin 80 MG tablet ?Commonly known as: LIPITOR ?Take 1 tablet by mouth once daily ?  ?carvedilol 12.5 MG tablet ?Commonly known as: COREG ?TAKE 1 TABLET BY MOUTH TWICE DAILY WITH MEALS ?  ?clopidogrel 75 MG tablet ?Commonly known as: PLAVIX ?Take 1 tablet by mouth once daily ?Notes to patient: Hold 5 days ?  ?dorzolamide-timolol 22.3-6.8 MG/ML ophthalmic solution ?Commonly known as: COSOPT ?Place 1 drop into both eyes 2 (two) times daily. ?  ?furosemide 40 MG tablet ?Commonly known as: LASIX ?Take 40 mg by mouth daily. ?  ?isosorbide mononitrate 30 MG 24 hr tablet ?Commonly known as: IMDUR ?Take 1 tablet (30 mg total) by mouth daily. ?  ?latanoprost 0.005 % ophthalmic solution ?Commonly known as: XALATAN ?Place 1 drop into both eyes at bedtime. ?  ?LORazepam 0.5 MG tablet ?Commonly known as: ATIVAN ?Take 0.5 mg by mouth daily as needed for anxiety. ?  ?lurasidone 40 MG Tabs tablet ?Commonly known as: LATUDA ?Take 40 mg by mouth daily with breakfast. ?  ?nitroGLYCERIN 0.4 MG SL tablet ?Commonly known as: NITROSTAT ?DISSOLVE ONE TABLET UNDER THE TONGUE EVERY FIVE MINUTES AS NEEDED FOR CHEST PAIN. DO NOT EXCEED A TOTAL OF THREE DOSES IN 15 MINUTES ?  ?spironolactone 25 MG tablet ?Commonly known as: ALDACTONE ?Take half a tablet by mouth once a day ?  ?traZODone 150 MG tablet ?Commonly known as: DESYREL ?Take 150 mg by mouth at bedtime. ?  ?valsartan 80 MG tablet ?Commonly known as: DIOVAN ?Take 1 tablet (80 mg total) by mouth 2 (two) times daily. ?What changed: when to take this ?  ?venlafaxine XR 150 MG 24 hr capsule ?Commonly known as: EFFEXOR-XR ?TAKE 2 CAPSULES BY MOUTH ONCE DAILY (NEEDS to follow-up with psychiatry for further refills) ?  ? ?  ? ? ?Disposition: Home ?Discharge Instructions   ? ? Diet - low sodium heart healthy   Complete by: As directed ?  ? Increase activity slowly   Complete by: As directed ?   ? ?  ? ? ? ?Duration of Discharge Encounter: Greater than 30 minutes including physician time. ? ?Signed, ?Tommye Standard, PA-C ?01/03/2022 ?9:09 AM ? ? ? ? ? ?

## 2022-01-03 NOTE — Plan of Care (Signed)

## 2022-01-04 ENCOUNTER — Encounter (HOSPITAL_COMMUNITY): Payer: Self-pay | Admitting: Cardiology

## 2022-01-11 DIAGNOSIS — S62627D Displaced fracture of medial phalanx of left little finger, subsequent encounter for fracture with routine healing: Secondary | ICD-10-CM | POA: Diagnosis not present

## 2022-01-12 ENCOUNTER — Telehealth: Payer: Self-pay | Admitting: Cardiology

## 2022-01-12 NOTE — Telephone Encounter (Signed)
Spoke with patient informed her that if possible keep the dressing/incision site to her chest dry until her wound check in 01/17/22 patient voiced understanding.  ?

## 2022-01-12 NOTE — Telephone Encounter (Signed)
Patient is calling stating she was advised not to get water on the site of her defibrillator when showering. She is wanting to know how long she needs to do this.  ?

## 2022-01-16 ENCOUNTER — Other Ambulatory Visit: Payer: Self-pay

## 2022-01-16 DIAGNOSIS — R748 Abnormal levels of other serum enzymes: Secondary | ICD-10-CM

## 2022-01-17 ENCOUNTER — Ambulatory Visit (INDEPENDENT_AMBULATORY_CARE_PROVIDER_SITE_OTHER): Payer: Medicare Other

## 2022-01-17 ENCOUNTER — Other Ambulatory Visit: Payer: Self-pay

## 2022-01-17 DIAGNOSIS — I447 Left bundle-branch block, unspecified: Secondary | ICD-10-CM

## 2022-01-17 DIAGNOSIS — I255 Ischemic cardiomyopathy: Secondary | ICD-10-CM

## 2022-01-17 LAB — CUP PACEART INCLINIC DEVICE CHECK
Battery Remaining Longevity: 51 mo
Brady Statistic RA Percent Paced: 94 %
Brady Statistic RV Percent Paced: 99.37 %
Date Time Interrogation Session: 20230315140139
HighPow Impedance: 51.75 Ohm
Implantable Lead Implant Date: 20230228
Implantable Lead Implant Date: 20230228
Implantable Lead Implant Date: 20230228
Implantable Lead Location: 753858
Implantable Lead Location: 753859
Implantable Lead Location: 753860
Implantable Lead Model: 3830
Implantable Pulse Generator Implant Date: 20230228
Lead Channel Impedance Value: 412.5 Ohm
Lead Channel Impedance Value: 475 Ohm
Lead Channel Impedance Value: 662.5 Ohm
Lead Channel Pacing Threshold Amplitude: 0.5 V
Lead Channel Pacing Threshold Amplitude: 0.5 V
Lead Channel Pacing Threshold Amplitude: 0.75 V
Lead Channel Pacing Threshold Amplitude: 0.75 V
Lead Channel Pacing Threshold Amplitude: 0.75 V
Lead Channel Pacing Threshold Amplitude: 0.75 V
Lead Channel Pacing Threshold Pulse Width: 0.4 ms
Lead Channel Pacing Threshold Pulse Width: 0.4 ms
Lead Channel Pacing Threshold Pulse Width: 0.5 ms
Lead Channel Pacing Threshold Pulse Width: 0.5 ms
Lead Channel Pacing Threshold Pulse Width: 0.5 ms
Lead Channel Pacing Threshold Pulse Width: 0.5 ms
Lead Channel Sensing Intrinsic Amplitude: 12 mV
Lead Channel Sensing Intrinsic Amplitude: 5 mV
Lead Channel Setting Pacing Amplitude: 0.25 V
Lead Channel Setting Pacing Amplitude: 3.5 V
Lead Channel Setting Pacing Amplitude: 3.5 V
Lead Channel Setting Pacing Pulse Width: 0.05 ms
Lead Channel Setting Pacing Pulse Width: 0.5 ms
Lead Channel Setting Sensing Sensitivity: 0.5 mV
Pulse Gen Serial Number: 8904264

## 2022-01-17 NOTE — Patient Instructions (Addendum)
? ?  After Your ICD ?(Implantable Cardiac Defibrillator) ? ? ? ?Monitor your defibrillator site for redness, swelling, and drainage. Call the device clinic at [phone] if you experience these symptoms or fever/chills. ? ?Your incision was closed with Steri-strips or staples:  You may shower 7 days after your procedure and wash your incision with soap and water. Avoid lotions, ointments, or perfumes over your incision until it is well-healed. ? ?You may use a hot tub or a pool after February 13, 2022.  ? ?Do not lift, push or pull greater than 10 pounds with the affected arm until after February 13 2022. There are no other restrictions in arm movement after your wound check appointment. ? ? ?Your ICD is designed to protect you from life threatening heart rhythms. Because of this, you may receive a shock.  ? ?1 shock with no symptoms:  Call the office during business hours. ?1 shock with symptoms (chest pain, chest pressure, dizziness, lightheadedness, shortness of breath, overall feeling unwell):  Call 911. ?If you experience 2 or more shocks in 24 hours:  Call 911. ?If you receive a shock, you should not drive.  ?[place] DMV - no driving for 6 months if you receive appropriate therapy from your ICD.  ? ?ICD Alerts:  Some alerts are vibratory and others beep. These are NOT emergencies. Please call our office to let us know. If this occurs at night or on weekends, it can wait until the next business day. Send a remote transmission. ? ?Remote monitoring is used to monitor your ICD from home. This monitoring is scheduled every 91 days by our office. It allows [name] to keep an eye on the functioning of your device to ensure it is working properly. You will routinely see your Electrophysiologist annually (more often if necessary).  ?

## 2022-01-17 NOTE — Progress Notes (Signed)
Wound check appointment. Steri-strips removed. Wound without redness or edema. Incision edges approximated, wound well healed. Normal device function. Thresholds, sensing, and impedances consistent with implant measurements. Device programmed at 3.5V for extra safety margin until 3 month visit. Histogram distribution appropriate for patient and level of activity. No mode switches or ventricular arrhythmias noted. Patient educated about wound care, arm mobility, lifting restrictions, shock plan. ROV 04/04/2022 with CL/B ?

## 2022-01-23 ENCOUNTER — Other Ambulatory Visit: Payer: Medicare Other

## 2022-01-25 DIAGNOSIS — J029 Acute pharyngitis, unspecified: Secondary | ICD-10-CM | POA: Diagnosis not present

## 2022-01-25 DIAGNOSIS — B9689 Other specified bacterial agents as the cause of diseases classified elsewhere: Secondary | ICD-10-CM | POA: Diagnosis not present

## 2022-01-25 DIAGNOSIS — J019 Acute sinusitis, unspecified: Secondary | ICD-10-CM | POA: Diagnosis not present

## 2022-01-25 DIAGNOSIS — R509 Fever, unspecified: Secondary | ICD-10-CM | POA: Diagnosis not present

## 2022-01-30 ENCOUNTER — Other Ambulatory Visit: Payer: Self-pay

## 2022-01-30 ENCOUNTER — Other Ambulatory Visit (INDEPENDENT_AMBULATORY_CARE_PROVIDER_SITE_OTHER): Payer: Medicare Other

## 2022-01-30 DIAGNOSIS — R748 Abnormal levels of other serum enzymes: Secondary | ICD-10-CM | POA: Diagnosis not present

## 2022-01-30 LAB — HEPATIC FUNCTION PANEL
ALT: 19 U/L (ref 0–35)
AST: 16 U/L (ref 0–37)
Albumin: 3.9 g/dL (ref 3.5–5.2)
Alkaline Phosphatase: 97 U/L (ref 39–117)
Bilirubin, Direct: 0.1 mg/dL (ref 0.0–0.3)
Total Bilirubin: 0.5 mg/dL (ref 0.2–1.2)
Total Protein: 6.4 g/dL (ref 6.0–8.3)

## 2022-01-31 ENCOUNTER — Other Ambulatory Visit: Payer: Self-pay | Admitting: *Deleted

## 2022-01-31 DIAGNOSIS — D649 Anemia, unspecified: Secondary | ICD-10-CM

## 2022-02-01 DIAGNOSIS — S62627D Displaced fracture of medial phalanx of left little finger, subsequent encounter for fracture with routine healing: Secondary | ICD-10-CM | POA: Diagnosis not present

## 2022-02-02 ENCOUNTER — Other Ambulatory Visit: Payer: Self-pay | Admitting: Family Medicine

## 2022-02-02 DIAGNOSIS — I639 Cerebral infarction, unspecified: Secondary | ICD-10-CM

## 2022-02-04 NOTE — Progress Notes (Signed)
?[place]  ?[phone] [phone] ? ?ID: Melanie Ray OB: 07/12/1951  MR#: [id number]  GEZ#:[id number] ? ?Patient Care Team: ?Leone Haven, MD as PCP - General (Family Medicine) ?Sherren Mocha, MD as PCP - Cardiology (Cardiology) ?Vickie Epley, MD as PCP - Electrophysiology (Cardiology) ?Sharmon Revere as Physician Assistant (Cardiology) ?Bensimhon, Shaune Pascal, MD as Consulting Physician (Cardiology) ?Alda Berthold, DO as Consulting Physician (Neurology) ?Lloyd Huger, MD as Consulting Physician (Hematology and Oncology) ? ?CHIEF COMPLAINT: Anemia, unspecified. ? ?INTERVAL HISTORY: Patient returns to clinic today for repeat laboratory work and further evaluation.  She currently feels well and is asymptomatic.  She does not complain of any weakness or fatigue.  She denies any recent fevers or illnesses.  She has no neurologic complaints.  She has a good appetite and denies weight loss.  She has no chest pain, shortness of breath, cough, or hemoptysis.  She denies any nausea, vomiting, constipation, or diarrhea.  She has no melena or hematochezia.  She has no urinary complaints.  Patient offers no specific complaints today. ? ?REVIEW OF SYSTEMS:   ?Review of Systems  ?Constitutional: Negative.  Negative for fever, malaise/fatigue and weight loss.  ?Respiratory: Negative.  Negative for cough, hemoptysis and shortness of breath.   ?Cardiovascular: Negative.  Negative for chest pain and leg swelling.  ?Gastrointestinal:  Negative for abdominal pain, blood in stool and melena.  ?Genitourinary: Negative.  Negative for hematuria.  ?Musculoskeletal: Negative.  Negative for back pain.  ?Skin: Negative.  Negative for rash.  ?Neurological: Negative.  Negative for dizziness, focal weakness, weakness and headaches.  ?Psychiatric/Behavioral: Negative.  The patient is not nervous/anxious.   ? ?As per HPI. Otherwise, a complete review of systems is  negative. ? ?PAST MEDICAL HISTORY: ?Past Medical History:  ?Diagnosis Date  ? Anemia   ? Anxiety   ? Back pain   ? CAD (coronary artery disease) 04/11/2016  ? S/p ant STEMI 5/17: LHC >> LAD proximal 80%, mid 80%, distal 50%, ostial D1 60%; LCx with LPDA lesion 30%; RCA Mild calcification with no significant stenosis in a medium caliber, nondominant RCA; LVEF is estimated at 45% with inferoapical and lateral wall akinesis >> PCI: PCI: 3.5 x 24 mm Promus DES to prox LAD, 2.5 x 12 mm Promus DES to mid LAD. // Myoview 7/21: EF 20, no ischemia; high risk   ? Chest pain   ? Chronic systolic CHF (congestive heart failure) ([place]) 03/21/2016  ? Echo 01/30/17: Diff HK, mild focal basal septal hypertrophy, EF 30-35, mild AI, MAC, mild MR // Echo 06/08/16: Mild focal basal septal hypertrophy, EF 25-30%, diff HK, ant-septal AK, Gr 1 DD, mild AI, MAC, mild MR, PASP 37 mmHg // Echo 03/18/16: EF 30-35%, ant-septal AK, Gr 1 DD, mild MR, severe LAE.  ? Constipation   ? Depression   ? Diabetes mellitus type 2 in obese [facility])   ? Dizziness   ? Glaucoma   ? Gout   ? Heart disease   ? Heartburn   ? History of acute anterior wall MI 03/17/2016  ? History of heart attack   ? Hyperlipemia   ? Hypertension   ? Ischemic cardiomyopathy 10/02/2016  ? Refused ICD  ? Joint pain   ? Left bundle branch block   ? Myocardial infarction [facility])   ? Nausea   ? Positive colorectal cancer screening using Cologuard test 03/01/2020  ? Sleep apnea   ? SOB (shortness of breath)   ?  Suicidal ideation 08/27/2018  ? Swelling   ? feet or legs  ? ? ?PAST SURGICAL HISTORY: ?Past Surgical History:  ?Procedure Laterality Date  ? APPENDECTOMY    ? BIV ICD INSERTION CRT-D N/A 01/02/2022  ? Procedure: BIV ICD INSERTION CRT-D;  Surgeon: Vickie Epley, MD;  Location: [place] CV [facility];  Service: Cardiovascular;  Laterality: N/A;  ? CARDIAC CATHETERIZATION N/A 03/17/2016  ? Procedure: Left Heart Cath and Coronary Angiography;  Surgeon: Sherren Mocha, MD;  Location: [place]  CV LAB;  Service: Cardiovascular;  Laterality: N/A;  ? CARDIAC CATHETERIZATION N/A 03/17/2016  ? Procedure: Coronary Stent Intervention;  Surgeon: Sherren Mocha, MD;  Location: [place] CV [facility];  Service: Cardiovascular;  Laterality: N/A;  ? COLONOSCOPY WITH PROPOFOL N/A 08/13/2017  ? Procedure: COLONOSCOPY WITH PROPOFOL;  Surgeon: Jonathon Bellows, MD;  Location: [facility] ENDOSCOPY;  Service: Gastroenterology;  Laterality: N/A;  ? COLONOSCOPY WITH PROPOFOL N/A 03/29/2020  ? Procedure: COLONOSCOPY WITH PROPOFOL;  Surgeon: Lucilla Lame, MD;  Location: [facility] ENDOSCOPY;  Service: Endoscopy;  Laterality: N/A;  ? SMALL BOWEL REPAIR    ? TONSILLECTOMY    ? UTERINE FIBROID SURGERY    ? ? ?FAMILY HISTORY: ?Family History  ?Problem Relation Age of Onset  ? Anxiety disorder Mother   ? Paranoid behavior Mother   ? Hypertension Mother   ? Dementia Mother   ? High Cholesterol Mother   ? Diabetes Mother   ? Hyperlipidemia Mother   ? Depression Mother   ? Hypertension Father   ? High Cholesterol Father   ? Mood Disorder Sister   ? Stroke Sister   ? Anxiety disorder Maternal Aunt   ? Tuberculosis Paternal Grandfather   ? Drug abuse Cousin   ? ? ?ADVANCED DIRECTIVES (Y/N):  N ? ?HEALTH MAINTENANCE: ?Social History  ? ?Tobacco Use  ? Smoking status: Former  ?  Types: Cigarettes  ?  Quit date: 04/13/1997  ?  Years since quitting: 24.8  ? Smokeless tobacco: Never  ?Vaping Use  ? Vaping Use: Never used  ?Substance Use Topics  ? Alcohol use: Not Currently  ? Drug use: No  ? ? ? Colonoscopy: ? PAP: ? Bone density: ? Lipid panel: ? ?Allergies  ?Allergen Reactions  ? Tetracycline Swelling  ? Meperidine Nausea And Vomiting  ?  Nausea  ? Entresto [Sacubitril-Valsartan] Other (See Comments)  ?  Shortness of Breath  ? ? ?Current Outpatient Medications  ?Medication Sig Dispense Refill  ? atorvastatin (LIPITOR) 80 MG tablet Take 1 tablet by mouth once daily 90 tablet 3  ? carvedilol (COREG) 12.5 MG tablet TAKE 1 TABLET BY MOUTH TWICE DAILY WITH MEALS 180  tablet 1  ? clopidogrel (PLAVIX) 75 MG tablet Take 1 tablet by mouth once daily 90 tablet 0  ? dorzolamide-timolol (COSOPT) 22.3-6.8 MG/ML ophthalmic solution Place 1 drop into both eyes 2 (two) times daily.     ? isosorbide mononitrate (IMDUR) 30 MG 24 hr tablet Take 1 tablet (30 mg total) by mouth daily. 90 tablet 3  ? latanoprost (XALATAN) 0.005 % ophthalmic solution Place 1 drop into both eyes at bedtime.    ? LORazepam (ATIVAN) 0.5 MG tablet Take 0.5 mg by mouth daily as needed for anxiety.    ? lurasidone (LATUDA) 40 MG TABS tablet Take 40 mg by mouth daily with breakfast.    ? traZODone (DESYREL) 150 MG tablet Take 150 mg by mouth at bedtime.    ? valsartan (DIOVAN) 80 MG tablet Take 1 tablet (  80 mg total) by mouth 2 (two) times daily. (Patient taking differently: Take 80 mg by mouth daily.) 180 tablet 3  ? venlafaxine XR (EFFEXOR-XR) 150 MG 24 hr capsule TAKE 2 CAPSULES BY MOUTH ONCE DAILY (NEEDS to follow-up with psychiatry for further refills) 60 capsule 0  ? furosemide (LASIX) 40 MG tablet Take 40 mg by mouth daily. (Patient not taking: Reported on 02/08/2022)    ? nitroGLYCERIN (NITROSTAT) 0.4 MG SL tablet DISSOLVE ONE TABLET UNDER THE TONGUE EVERY FIVE MINUTES AS NEEDED FOR CHEST PAIN. DO NOT EXCEED A TOTAL OF THREE DOSES IN 15 MINUTES (Patient not taking: Reported on 02/08/2022) 25 tablet 4  ? spironolactone (ALDACTONE) 25 MG tablet Take half a tablet by mouth once a day (Patient not taking: Reported on 02/08/2022) 45 tablet 3  ? ?No current facility-administered medications for this visit.  ? ? ?OBJECTIVE: ?Vitals:  ? 02/08/22 1425  ?BP: 139/65  ?Pulse: 77  ?Resp: 16  ?Temp: 97.8 ?F (36.6 ?C)  ?SpO2: 98%  ?   Body mass index is 24.34 kg/m?[name]    ECOG FS:0 - Asymptomatic ? ?General: Well-developed, well-nourished, no acute distress. ?Eyes: Pink conjunctiva, anicteric sclera. ?HEENT: Normocephalic, moist mucous membranes. ?Lungs: No audible wheezing or coughing. ?Heart: Regular rate and rhythm. ?Abdomen: Soft,  nontender, no obvious distention. ?Musculoskeletal: No edema, cyanosis, or clubbing. ?Neuro: Alert, answering all questions appropriately. Cranial nerves grossly intact. ?Skin: No rashes or petechiae noted.

## 2022-02-07 ENCOUNTER — Other Ambulatory Visit: Payer: Self-pay | Admitting: Emergency Medicine

## 2022-02-07 DIAGNOSIS — D649 Anemia, unspecified: Secondary | ICD-10-CM

## 2022-02-08 ENCOUNTER — Inpatient Hospital Stay: Payer: Medicare Other | Attending: Oncology

## 2022-02-08 ENCOUNTER — Inpatient Hospital Stay (HOSPITAL_BASED_OUTPATIENT_CLINIC_OR_DEPARTMENT_OTHER): Payer: Medicare Other | Admitting: Oncology

## 2022-02-08 VITALS — BP 139/65 | HR 77 | Temp 97.8°F | Resp 16 | Ht 63.0 in | Wt 137.4 lb

## 2022-02-08 DIAGNOSIS — I509 Heart failure, unspecified: Secondary | ICD-10-CM | POA: Insufficient documentation

## 2022-02-08 DIAGNOSIS — D649 Anemia, unspecified: Secondary | ICD-10-CM

## 2022-02-08 DIAGNOSIS — Z79899 Other long term (current) drug therapy: Secondary | ICD-10-CM | POA: Diagnosis not present

## 2022-02-08 LAB — RETICULOCYTES
Immature Retic Fract: 8.1 % (ref 2.3–15.9)
RBC.: 3.99 MIL/uL (ref 3.87–5.11)
Retic Count, Absolute: 67.8 10*3/uL (ref 19.0–186.0)
Retic Ct Pct: 1.7 % (ref 0.4–3.1)

## 2022-02-08 LAB — CBC WITH DIFFERENTIAL/PLATELET
Abs Immature Granulocytes: 0.03 K/uL (ref 0.00–0.07)
Basophils Absolute: 0 K/uL (ref 0.0–0.1)
Basophils Relative: 1 %
Eosinophils Absolute: 0.1 K/uL (ref 0.0–0.5)
Eosinophils Relative: 1 %
HCT: 32.2 % — ABNORMAL LOW (ref 36.0–46.0)
Hemoglobin: 10.3 g/dL — ABNORMAL LOW (ref 12.0–15.0)
Immature Granulocytes: 0 %
Lymphocytes Relative: 16 %
Lymphs Abs: 1.3 K/uL (ref 0.7–4.0)
MCH: 26.1 pg (ref 26.0–34.0)
MCHC: 32 g/dL (ref 30.0–36.0)
MCV: 81.7 fL (ref 80.0–100.0)
Monocytes Absolute: 1 K/uL (ref 0.1–1.0)
Monocytes Relative: 12 %
Neutro Abs: 6 K/uL (ref 1.7–7.7)
Neutrophils Relative %: 70 %
Platelets: 292 K/uL (ref 150–400)
RBC: 3.94 MIL/uL (ref 3.87–5.11)
RDW: 14.6 % (ref 11.5–15.5)
WBC: 8.5 K/uL (ref 4.0–10.5)
nRBC: 0 % (ref 0.0–0.2)

## 2022-02-08 LAB — FOLATE: Folate: 11.9 ng/mL (ref >5.9)

## 2022-02-08 LAB — DAT, POLYSPECIFIC AHG (ARMC ONLY): Polyspecific AHG test: NEGATIVE

## 2022-02-08 LAB — LACTATE DEHYDROGENASE: LDH: 169 U/L (ref 98–192)

## 2022-02-08 NOTE — Progress Notes (Signed)
Pt reports SOB and dizziness daily. No aggravating/alleviating factors. Pt had pacemaker implanted 01/02/2022 that is "supposed to help relieve those symptoms" ?

## 2022-02-09 LAB — ERYTHROPOIETIN: Erythropoietin: 23.3 m[IU]/mL — ABNORMAL HIGH (ref 2.6–18.5)

## 2022-02-12 LAB — PROTEIN ELECTROPHORESIS, SERUM
A/G Ratio: 1 (ref 0.7–1.7)
Albumin ELP: 3.1 g/dL (ref 2.9–4.4)
Alpha-1-Globulin: 0.3 g/dL (ref 0.0–0.4)
Alpha-2-Globulin: 0.7 g/dL (ref 0.4–1.0)
Beta Globulin: 1.1 g/dL (ref 0.7–1.3)
Gamma Globulin: 0.9 g/dL (ref 0.4–1.8)
Globulin, Total: 3.1 g/dL (ref 2.2–3.9)
Total Protein ELP: 6.2 g/dL (ref 6.0–8.5)

## 2022-02-14 ENCOUNTER — Telehealth: Payer: Self-pay | Admitting: Cardiology

## 2022-02-14 NOTE — Telephone Encounter (Signed)
Patient is 6 weeks post-op and would like to know if she can wear a bra. Please advise. ?

## 2022-02-14 NOTE — Telephone Encounter (Signed)
Called patient to advise she can wear a bra. Appreciative of call. ?

## 2022-02-16 ENCOUNTER — Ambulatory Visit: Payer: Medicare Other

## 2022-02-16 ENCOUNTER — Telehealth: Payer: Self-pay

## 2022-02-16 NOTE — Telephone Encounter (Signed)
No answer when called for AWV. Left message to reschedule.  ?

## 2022-02-20 DIAGNOSIS — S62627D Displaced fracture of medial phalanx of left little finger, subsequent encounter for fracture with routine healing: Secondary | ICD-10-CM | POA: Diagnosis not present

## 2022-03-01 DIAGNOSIS — S62627D Displaced fracture of medial phalanx of left little finger, subsequent encounter for fracture with routine healing: Secondary | ICD-10-CM | POA: Diagnosis not present

## 2022-03-06 DIAGNOSIS — S62627D Displaced fracture of medial phalanx of left little finger, subsequent encounter for fracture with routine healing: Secondary | ICD-10-CM | POA: Diagnosis not present

## 2022-03-08 DIAGNOSIS — Z79899 Other long term (current) drug therapy: Secondary | ICD-10-CM | POA: Diagnosis not present

## 2022-03-08 DIAGNOSIS — F251 Schizoaffective disorder, depressive type: Secondary | ICD-10-CM | POA: Diagnosis not present

## 2022-03-13 DIAGNOSIS — M25642 Stiffness of left hand, not elsewhere classified: Secondary | ICD-10-CM | POA: Diagnosis not present

## 2022-03-15 ENCOUNTER — Ambulatory Visit (HOSPITAL_COMMUNITY)
Admission: RE | Admit: 2022-03-15 | Discharge: 2022-03-15 | Disposition: A | Discharge status: Home / Self Care | Payer: Medicare Other | Source: Ambulatory Visit | Attending: Internal Medicine | Admitting: Internal Medicine

## 2022-03-15 ENCOUNTER — Encounter (HOSPITAL_COMMUNITY): Payer: Self-pay | Admitting: Internal Medicine

## 2022-03-15 VITALS — BP 130/64 | HR 88 | Wt 130.8 lb

## 2022-03-15 DIAGNOSIS — N179 Acute kidney failure, unspecified: Secondary | ICD-10-CM | POA: Insufficient documentation

## 2022-03-15 DIAGNOSIS — I340 Nonrheumatic mitral (valve) insufficiency: Secondary | ICD-10-CM | POA: Insufficient documentation

## 2022-03-15 DIAGNOSIS — I2510 Atherosclerotic heart disease of native coronary artery without angina pectoris: Secondary | ICD-10-CM | POA: Insufficient documentation

## 2022-03-15 DIAGNOSIS — I255 Ischemic cardiomyopathy: Secondary | ICD-10-CM | POA: Insufficient documentation

## 2022-03-15 DIAGNOSIS — Z955 Presence of coronary angioplasty implant and graft: Secondary | ICD-10-CM | POA: Insufficient documentation

## 2022-03-15 DIAGNOSIS — I5022 Chronic systolic (congestive) heart failure: Secondary | ICD-10-CM

## 2022-03-15 DIAGNOSIS — I110 Hypertensive heart disease with heart failure: Secondary | ICD-10-CM | POA: Insufficient documentation

## 2022-03-15 DIAGNOSIS — I252 Old myocardial infarction: Secondary | ICD-10-CM | POA: Diagnosis not present

## 2022-03-15 DIAGNOSIS — I447 Left bundle-branch block, unspecified: Secondary | ICD-10-CM | POA: Diagnosis not present

## 2022-03-15 DIAGNOSIS — I219 Acute myocardial infarction, unspecified: Secondary | ICD-10-CM | POA: Diagnosis not present

## 2022-03-15 DIAGNOSIS — Z79899 Other long term (current) drug therapy: Secondary | ICD-10-CM | POA: Diagnosis not present

## 2022-03-15 DIAGNOSIS — I10 Essential (primary) hypertension: Secondary | ICD-10-CM | POA: Diagnosis not present

## 2022-03-15 NOTE — Patient Instructions (Addendum)
Good to see you today! ? ?No change in medications ? ?No lab work needed ? ?Your physician has requested that you have an echocardiogram. Echocardiography is a painless test that uses sound waves to create images of your heart. It provides your doctor with information about the size and shape of your heart and how well your heart?s chambers and valves are working. This procedure takes approximately one hour. There are no restrictions for this procedure. ? ?Your physician recommends that you schedule a follow-up appointment in: 4 months w echocardiogram ? ?If you have any questions or concerns before your next appointment please send [name] a message through [place] or call our office at [phone].   ? ?TO LEAVE A MESSAGE FOR THE NURSE SELECT OPTION 2, PLEASE LEAVE A MESSAGE INCLUDING: ?YOUR NAME ?DATE OF BIRTH ?CALL BACK NUMBER ?REASON FOR CALL**this is important as we prioritize the call backs ? ?YOU WILL RECEIVE A CALL BACK THE SAME DAY AS LONG AS YOU CALL BEFORE 4:00 PM ?At the [facility], you and your health needs are our priority. As part of our continuing mission to provide you with exceptional heart care, we have created designated Provider Care Teams. These Care Teams include your primary Cardiologist (physician) and Advanced Practice Providers (APPs- Physician Assistants and Nurse Practitioners) who all work together to provide you with the care you need, when you need it.  ? ?You may see any of the following providers on your designated Care Team at your next follow up: ?Dr Glori Bickers ?Dr Loralie Champagne ?Darrick Grinder, NP ?Lyda Jester, PA ?Jessica Milford,NP ?Marlyce Huge, PA ?Audry Riles, PharmD ? ? ?Please be sure to bring in all your medications bottles to every appointment.  ? ? ?

## 2022-03-15 NOTE — Progress Notes (Signed)
? ?ADVANCED HF CLINIC NOTE ? ?Referring Physician: Dr. Burt Knack ?Primary Care: Leone Haven, MD ?Primary Cardiologist: Sherren Mocha, MD ? ? ?HPI: ? ?[name] is a 71 y.o. female with the following medical issues. She was referred by Dr. Burt Knack for further assessment and management of her HF. ? ?Coronary artery disease  ?S/p Anterior STEMI 5/17 >> PCI: DES x 2 to LAD ?Myoview 7/21: EF 20, no ischemia ?Heart failure with reduced ejection fraction   ?Ischemic CM ?EF 30-35 at time of MI 5/17 >> DC on [facility] >> Pt DC'd at FU visit ?Echocardiogram 8/17: EF 25-30 ?Echocardiogram 3/18: EF 30-35 ?Echocardiogram 8/19: EF 30-35 ?EP eval (J Allred) x 2 >> pt declined CRT-D ?Intol of Entresto ?Echocardiogram 03/2020: EF < 20% ?Myoview 7/21 EF 20% no ischemia ?Obsessive Compulsive d/o ?Diabetes mellitus 2 ?Hypertension  ?Hyperlipidemia  ?LBBB ?OSA ?Hepatic steatosis  ?Hx of cerebellar CVA 03/2020 ?Bilateral vertebral artery stenosis  ? ?We saw her for the first time in 3/22. Referred for cMRI to assess degree of LAD scar. ? ?cMRI 1/23 ?1. Moderately dilated LV with diffuse hypokinesis and septal-lateral ?dyssynchrony consistent with LBBB. EF 17%. ?2.  Normal RV size and systolic function, EF 60%. ?3. At least moderate MR, likely functional with posterior leaflet ?restriction. Unable to quantify as flow sequences not done. ?4.  No significant LGE noted. ?5. With lack of LGE, LBBB cardiomyopathy may be a major contributor to ?low EF. ? ?Was admitted 12/22 with HF flare. Was not taking lasix regularly. Also ate a lot of salt over Xmas.   ? ?CRT-D placed 01/02/22 with LBBB lead by Dr. Quentin Ore ? ?Here for routine f/u. Says she feels some better after CRT. But still has good days and bad days. Can do ADLs but will struggle at times when she goes to the store. No problems with meds. PCP stopped diuretics due to AKI.  ? ? ? ?Past Medical History:  ?Diagnosis Date  ? Anemia   ? Anxiety   ? Back pain   ? CAD  (coronary artery disease) 04/11/2016  ? S/p ant STEMI 5/17: LHC >> LAD proximal 80%, mid 80%, distal 50%, ostial D1 60%; LCx with LPDA lesion 30%; RCA Mild calcification with no significant stenosis in a medium caliber, nondominant RCA; LVEF is estimated at 45% with inferoapical and lateral wall akinesis >> PCI: PCI: 3.5 x 24 mm Promus DES to prox LAD, 2.5 x 12 mm Promus DES to mid LAD. // Myoview 7/21: EF 20, no ischemia; high risk   ? Chest pain   ? Chronic systolic CHF (congestive heart failure) ([place]) 03/21/2016  ? Echo 01/30/17: Diff HK, mild focal basal septal hypertrophy, EF 30-35, mild AI, MAC, mild MR // Echo 06/08/16: Mild focal basal septal hypertrophy, EF 25-30%, diff HK, ant-septal AK, Gr 1 DD, mild AI, MAC, mild MR, PASP 37 mmHg // Echo 03/18/16: EF 30-35%, ant-septal AK, Gr 1 DD, mild MR, severe LAE.  ? Constipation   ? Depression   ? Diabetes mellitus type 2 in obese [facility])   ? Dizziness   ? Glaucoma   ? Gout   ? Heart disease   ? Heartburn   ? History of acute anterior wall MI 03/17/2016  ? History of heart attack   ? Hyperlipemia   ? Hypertension   ? Ischemic cardiomyopathy 10/02/2016  ? Refused ICD  ? Joint pain   ? Left bundle branch block   ? Myocardial infarction [facility])   ? Nausea   ?  Positive colorectal cancer screening using Cologuard test 03/01/2020  ? Sleep apnea   ? SOB (shortness of breath)   ? Suicidal ideation 08/27/2018  ? Swelling   ? feet or legs  ? ? ?Current Outpatient Medications  ?Medication Sig Dispense Refill  ? atorvastatin (LIPITOR) 80 MG tablet Take 1 tablet by mouth once daily 90 tablet 3  ? carvedilol (COREG) 12.5 MG tablet TAKE 1 TABLET BY MOUTH TWICE DAILY WITH MEALS 180 tablet 1  ? clopidogrel (PLAVIX) 75 MG tablet Take 1 tablet by mouth once daily 90 tablet 0  ? dorzolamide-timolol (COSOPT) 22.3-6.8 MG/ML ophthalmic solution Place 1 drop into both eyes 2 (two) times daily.     ? isosorbide mononitrate (IMDUR) 30 MG 24 hr tablet Take 1 tablet (30 mg total) by mouth daily. 90  tablet 3  ? latanoprost (XALATAN) 0.005 % ophthalmic solution Place 1 drop into both eyes at bedtime.    ? LORazepam (ATIVAN) 0.5 MG tablet Take 0.5 mg by mouth daily as needed for anxiety.    ? lurasidone (LATUDA) 40 MG TABS tablet Take 40 mg by mouth daily with breakfast.    ? nitroGLYCERIN (NITROSTAT) 0.4 MG SL tablet DISSOLVE ONE TABLET UNDER THE TONGUE EVERY FIVE MINUTES AS NEEDED FOR CHEST PAIN. DO NOT EXCEED A TOTAL OF THREE DOSES IN 15 MINUTES 25 tablet 4  ? traZODone (DESYREL) 150 MG tablet Take 150 mg by mouth at bedtime.    ? valsartan (DIOVAN) 80 MG tablet Take 80 mg by mouth daily.    ? venlafaxine XR (EFFEXOR-XR) 150 MG 24 hr capsule TAKE 2 CAPSULES BY MOUTH ONCE DAILY (NEEDS to follow-up with psychiatry for further refills) 60 capsule 0  ? ?No current facility-administered medications for this encounter.  ? ? ?Allergies  ?Allergen Reactions  ? Tetracycline Swelling  ? Meperidine Nausea And Vomiting  ?  Nausea  ? Entresto [Sacubitril-Valsartan] Other (See Comments)  ?  Shortness of Breath  ? ? ?  ?Social History  ? ?Socioeconomic History  ? Marital status: Married  ?  Spouse name: Ryver Zadrozny  ? Number of children: 0  ? Years of education: BS in education  ? Highest education level: Not on file  ?Occupational History  ? Occupation:  Retired Education officer, museum  ?Tobacco Use  ? Smoking status: Former  ?  Types: Cigarettes  ?  Quit date: 04/13/1997  ?  Years since quitting: 24.9  ? Smokeless tobacco: Never  ?Vaping Use  ? Vaping Use: Never used  ?Substance and Sexual Activity  ? Alcohol use: Not Currently  ? Drug use: No  ? Sexual activity: Not Currently  ?Other Topics Concern  ? Not on file  ?Social History Narrative  ? Lives in [place] with spouse.  No children.  ? Retired first [employer] for over 30 years ([place] for 10 years and then in [place] for over 20 years).  ? Left-handed  ? Lives in a two story home   ?   ? ?Social Determinants of Health  ? ?Financial Resource Strain: Not on file   ?Food Insecurity: Not on file  ?Transportation Needs: Not on file  ?Physical Activity: Not on file  ?Stress: Not on file  ?Social Connections: Not on file  ?Intimate Partner Violence: Not on file  ? ? ?  ?Family History  ?Problem Relation Age of Onset  ? Anxiety disorder Mother   ? Paranoid behavior Mother   ? Hypertension Mother   ? Dementia Mother   ?  High Cholesterol Mother   ? Diabetes Mother   ? Hyperlipidemia Mother   ? Depression Mother   ? Hypertension Father   ? High Cholesterol Father   ? Mood Disorder Sister   ? Stroke Sister   ? Anxiety disorder Maternal Aunt   ? Tuberculosis Paternal Grandfather   ? Drug abuse Cousin   ? ? ?Vitals:  ? 03/15/22 1438  ?BP: 130/64  ?Pulse: 88  ?SpO2: 99%  ?Weight: 59.3 kg (130 lb 12.8 oz)  ? ?Filed Weights  ? 03/15/22 1438  ?Weight: 59.3 kg (130 lb 12.8 oz)  ? ? ? ?PHYSICAL EXAM: ?General:  Well appearing. No resp difficulty ?HEENT: normal ?Neck: supple. no JVD. Carotids 2+ bilat; no bruits. No lymphadenopathy or thryomegaly appreciated. ?Cor: PMI nondisplaced. Regular rate & rhythm. No rubs, gallops or murmurs. ?Lungs: clear ?Abdomen: soft, nontender, nondistended. No hepatosplenomegaly. No bruits or masses. Good bowel sounds. ?Extremities: no cyanosis, clubbing, rash, edema ?Neuro: alert & orientedx3, cranial nerves grossly intact. moves all 4 extremities w/o difficulty. Affect pleasant ? ?ECG: AV paced at 89 QRS 12m (152 ms prior to CRT) Personally reviewed ? ? ?ASSESSMENT & PLAN: ? ?1. Chronic systolic HF ?- initially felt due to ischemic CM. S/p Anterior STEMI 5/17  ?- Echocardiogram 8/19: EF 30-35% ?- Echo 5/21 EF < 20%   ?- cMRI 1/23 EF 15% No LGE. RV ok. Severe LV dyssynchrony Moderate MR  ?- CRT-D placed 01/02/22 with LBBB lead by Dr. LQuentin Ore?- Mildly improved NYHA II-III ?- Volume status ok ?- Continue carvedilol 12.5 bid ?- Continue valsartan 80 bid (could not afford Entresto and made her SOB) ?- Off spiro due to AKI ?- Off [name] due to cost. We discussed  again today and she does not want to restart. Also reluctant to increase any other meds ?- QRS 153 -> 136 with CRT. Will repeat echo an reassess for LV improvement post-CRT ?  ?2. Coronary artery disease involvi

## 2022-03-30 ENCOUNTER — Ambulatory Visit (INDEPENDENT_AMBULATORY_CARE_PROVIDER_SITE_OTHER): Payer: Medicare Other

## 2022-03-30 VITALS — Ht 63.0 in | Wt 130.0 lb

## 2022-03-30 DIAGNOSIS — Z0000 Encounter for general adult medical examination without abnormal findings: Secondary | ICD-10-CM | POA: Diagnosis not present

## 2022-03-30 NOTE — Progress Notes (Signed)
Subjective:   Melanie Ray is a 71 y.o. female who presents for Medicare Annual (Subsequent) preventive examination.  Review of Systems    No ROS.  Medicare Wellness Virtual Visit.  Visual/audio telehealth visit, UTA vital signs.   See social history for additional risk factors.   Cardiac Risk Factors include: advanced age (>57mn, >>56women);diabetes mellitus     Objective:    Today's Vitals   03/30/22 1310  Weight: 130 lb (59 kg)  Height: _0  (1.6 m)   Body mass index is 23.03 kg/m.     03/30/2022    1:22 PM 02/08/2022    2:21 PM 01/02/2022   11:40 AM 11/09/2021    2:48 PM 11/03/2021   11:28 AM 10/13/2021   12:56 PM 05/03/2020    8:44 AM  Advanced Directives  Does Patient Have a Medical Advance Directive? No Yes _1   Type of ACorporate treasurerof [place]ort [place] will  [place] will     Does patient want to make changes to medical advance directive?  No - Patient declined       Copy of [place] Chart?  No - copy requested       Would patient like information on creating a medical advance directive? No - Patient declined No - Patient declined No - Patient declined    No - Patient declined    Current Medications (verified) Outpatient Encounter Medications as of 03/30/2022  Medication Sig   atorvastatin (LIPITOR) 80 MG tablet Take 1 tablet by mouth once daily   carvedilol (COREG) 12.5 MG tablet TAKE 1 TABLET BY MOUTH TWICE DAILY WITH MEALS   clopidogrel (PLAVIX) 75 MG tablet Take 1 tablet by mouth once daily   dorzolamide-timolol (COSOPT) 22.3-6.8 MG/ML ophthalmic solution Place 1 drop into both eyes 2 (two) times daily.    isosorbide mononitrate (IMDUR) 30 MG 24 hr tablet Take 1 tablet (30 mg total) by mouth daily.   latanoprost (XALATAN) 0.005 % ophthalmic solution Place 1 drop into both eyes at bedtime.   LORazepam (ATIVAN) 0.5 MG tablet Take 0.5 mg by mouth daily as needed for anxiety.    lurasidone (LATUDA) 40 MG TABS tablet Take 40 mg by mouth daily with breakfast.   nitroGLYCERIN (NITROSTAT) 0.4 MG SL tablet DISSOLVE ONE TABLET UNDER THE TONGUE EVERY FIVE MINUTES AS NEEDED FOR CHEST PAIN. DO NOT EXCEED A TOTAL OF THREE DOSES IN 15 MINUTES   traZODone (DESYREL) 150 MG tablet Take 150 mg by mouth at bedtime.   valsartan (DIOVAN) 80 MG tablet Take 80 mg by mouth daily.   venlafaxine XR (EFFEXOR-XR) 150 MG 24 hr capsule TAKE 2 CAPSULES BY MOUTH ONCE DAILY (NEEDS to follow-up with psychiatry for further refills)   No facility-administered encounter medications on file as of 03/30/2022.    Allergies (verified) Tetracycline, Meperidine, and Entresto [sacubitril-valsartan]   History: Past Medical History:  Diagnosis Date   Anemia    Anxiety    Back pain    CAD (coronary artery disease) 04/11/2016   S/p ant STEMI 5/17: LHC >> LAD proximal 80%, mid 80%, distal 50%, ostial D1 60%; LCx with LPDA lesion 30%; RCA Mild calcification with no significant stenosis in a medium caliber, nondominant RCA; LVEF is estimated at 45% with inferoapical and lateral wall akinesis >> PCI: PCI: 3.5 x 24 mm Promus DES to prox LAD, 2.5 x 12 mm Promus DES to mid LAD. // Myoview 7/21:  EF 20, no ischemia; high risk    Chest pain    Chronic systolic CHF (congestive heart failure) ([place]) 03/21/2016   Echo 01/30/17: Diff HK, mild focal basal septal hypertrophy, EF 30-35, mild AI, MAC, mild MR // Echo 06/08/16: Mild focal basal septal hypertrophy, EF 25-30%, diff HK, ant-septal AK, Gr 1 DD, mild AI, MAC, mild MR, PASP 37 mmHg // Echo 03/18/16: EF 30-35%, ant-septal AK, Gr 1 DD, mild MR, severe LAE.   Constipation    Depression    Diabetes mellitus type 2 in obese (HCC)    Dizziness    Glaucoma    Gout    Heart disease    Heartburn    History of acute anterior wall MI 03/17/2016   History of heart attack    Hyperlipemia    Hypertension    Ischemic cardiomyopathy 10/02/2016   Refused ICD   Joint pain     Left bundle branch block    Myocardial infarction ([place])    Nausea    Positive colorectal cancer screening using Cologuard test 03/01/2020   Sleep apnea    SOB (shortness of breath)    Suicidal ideation 08/27/2018   Swelling    feet or legs   Past Surgical History:  Procedure Laterality Date   APPENDECTOMY     BIV ICD INSERTION CRT-D N/A 01/02/2022   Procedure: BIV ICD INSERTION CRT-D;  Surgeon: Vickie Epley, MD;  Location: [facility];  Service: Cardiovascular;  Laterality: N/A;   CARDIAC CATHETERIZATION N/A 03/17/2016   Procedure: Left Heart Cath and Coronary Angiography;  Surgeon: Sherren Mocha, MD;  Location: [facility];  Service: Cardiovascular;  Laterality: N/A;   CARDIAC CATHETERIZATION N/A 03/17/2016   Procedure: Coronary Stent Intervention;  Surgeon: Sherren Mocha, MD;  Location: [facility];  Service: Cardiovascular;  Laterality: N/A;   COLONOSCOPY WITH PROPOFOL N/A 08/13/2017   Procedure: COLONOSCOPY WITH PROPOFOL;  Surgeon: Jonathon Bellows, MD;  Location: [facility];  Service: Gastroenterology;  Laterality: N/A;   COLONOSCOPY WITH PROPOFOL N/A 03/29/2020   Procedure: COLONOSCOPY WITH PROPOFOL;  Surgeon: Lucilla Lame, MD;  Location: [facility];  Service: Endoscopy;  Laterality: N/A;   SMALL BOWEL REPAIR     TONSILLECTOMY     UTERINE FIBROID SURGERY     Family History  Problem Relation Age of Onset   Anxiety disorder Mother    Paranoid behavior Mother    Hypertension Mother    Dementia Mother    High Cholesterol Mother    Diabetes Mother    Hyperlipidemia Mother    Depression Mother    Hypertension Father    High Cholesterol Father    Mood Disorder Sister    Stroke Sister    Anxiety disorder Maternal Aunt    Tuberculosis Paternal Grandfather    Drug abuse Cousin    Social History   Socioeconomic History   Marital status: Married    Spouse name: Harvest Deist   Number of children: 0   Years of education: BS in education    Highest education level: Not on file  Occupational History   Occupation:  Retired Education officer, museum  Tobacco Use   Smoking status: Former    Types: Cigarettes    Quit date: 04/13/1997    Years since quitting: 24.9   Smokeless tobacco: Never  Vaping Use   Vaping Use: Never used  Substance and Sexual Activity   Alcohol use: Not Currently   Drug use: No   Sexual activity:  Not Currently  Other Topics Concern   Not on file  Social History Narrative   Lives in [place] with spouse.  No children.   Retired first [employer] for over 30 years ([place] for 10 years and then in [place] for over 20 years).   Left-handed   Lives in a two story home       Social Determinants of Health   Financial Resource Strain: Low Risk    Difficulty of Paying Living Expenses: Not hard at all  Food Insecurity: No Food Insecurity   Worried About Charity fundraiser in the Last Year: Never true   Arboriculturist in the Last Year: Never true  Transportation Needs: No Transportation Needs   Lack of Transportation (Medical): No   Lack of Transportation (Non-Medical): No  Physical Activity: Not on file  Stress: No Stress Concern Present   Feeling of Stress : Not at all  Social Connections: Unknown   Frequency of Communication with Friends and Family: Not on file   Frequency of Social Gatherings with Friends and Family: Not on file   Attends Religious Services: Not on file   Active Member of Clubs or Organizations: Not on file   Attends Archivist Meetings: Not on file   Marital Status: Married    Tobacco Counseling Counseling given: Not Answered   Clinical Intake:  Pre-visit preparation completed: Yes        Diabetes: Yes (Followed by PCP)  How often do you need to have someone help you when you read instructions, pamphlets, or other written materials from your doctor or pharmacy?: 1 - Never   Interpreter Needed?: No      Activities of Daily Living    03/30/2022     1:36 PM 01/02/2022   11:39 AM  In your present state of health, do you have any difficulty performing the following activities:  Hearing? 0   Vision? 0   Difficulty concentrating or making decisions? 0   Walking or climbing stairs? 0 0  Dressing or bathing? 0   Doing errands, shopping? 0   Preparing Food and eating ? N   Using the Toilet? N   In the past six months, have you accidently leaked urine? N   Do you have problems with loss of bowel control? N   Managing your Medications? N   Managing your Finances? N   Housekeeping or managing your Housekeeping? N     Patient Care Team: Leone Haven, MD as PCP - General (Family Medicine) Sherren Mocha, MD as PCP - Cardiology (Cardiology) Vickie Epley, MD as PCP - Electrophysiology (Cardiology) Sharmon Revere as Physician Assistant (Cardiology) Bensimhon, Shaune Pascal, MD as Consulting Physician (Cardiology) Alda Berthold, DO as Consulting Physician (Neurology) Lloyd Huger, MD as Consulting Physician (Hematology and Oncology)  Indicate any recent Medical Services you may have received from other than [facility] providers in the past year (date may be approximate).     Assessment:   This is a routine wellness examination for Melanie Ray.  Virtual Visit via Telephone Note  I connected with  Melanie Ray on 03/30/22 at  1:00 PM EDT by telephone and verified that I am speaking with the correct person using two identifiers.  Persons participating in the virtual visit: patient/Nurse Health Advisor   I discussed the limitations of performing an evaluation and management service by telehealth. We continued and completed visit with audio only. Some vital signs may be  absent or patient reported.   Hearing/Vision screen Hearing Screening - Comments:: Patient is able to hear conversational tones without difficulty.  No issues reported.  Vision Screening - Comments:: Followed by [facility]  Ophthalmology Wears reader  lenses  Glaucoma  They have seen their ophthalmologist in the last 12 months.   Dietary issues and exercise activities discussed: Current Exercise Habits: Home exercise routine, Intensity: Mild Healthy diet    Goals Addressed               This Visit's Progress     Patient Stated     Increase physical activity (pt-stated)        As tolerated       Depression Screen    03/30/2022    1:16 PM 09/05/2021   11:39 AM 12/14/2020    2:26 PM 09/14/2020    2:19 PM 06/27/2020    3:12 PM 05/03/2020    8:48 AM 03/01/2020   10:43 AM  PHQ 2/9 Scores  PHQ - 2 Score 0 0 0 0 0 0 0    Fall Risk    03/30/2022    1:36 PM 10/13/2021   12:55 PM 09/05/2021   11:38 AM 12/14/2020    2:26 PM 09/14/2020    2:19 PM  [place] in the past year? 0 1 0 0 1  Number falls in past yr: 0 1 0 0 0  Injury with Fall?  1 0  0  Risk for fall due to : History of fall(s)  No Fall Risks    Follow up Falls evaluation completed  Falls evaluation completed Falls evaluation completed Falls evaluation completed    [place]: Home free of loose throw rugs in walkways, pet beds, electrical cords, etc? Yes  Adequate lighting in your home to reduce risk of falls? Yes   ASSISTIVE DEVICES UTILIZED TO PREVENT FALLS: Life alert? No  Use of a cane, walker or w/c? No   TIMED UP AND GO: Was the test performed? No .   Cognitive Function:  Patient is alert and oriented x3.       03/30/2022    1:37 PM 05/03/2020    8:52 AM  6CIT Screen  What Year? 0 points 0 points  What month? 0 points 0 points  What time? 0 points   Count back from 20 0 points   Months in reverse 0 points 0 points  Repeat phrase 0 points 0 points  Total Score 0 points     Immunizations Immunization History  Administered Date(s) Administered   Influenza Split 10/03/2005, 09/24/2008, 09/09/2009, 09/15/2010, 08/14/2011   Influenza, High Dose Seasonal PF 10/15/2018   Influenza,inj,Quad PF,6+ Mos  07/28/2015   Influenza-Unspecified 10/04/2016, 10/07/2017, 10/15/2018, 08/20/2020, 08/22/2021   PFIZER(Purple Top)SARS-COV-2 Vaccination 12/11/2019, 01/01/2020, 06/30/2020   Pneumococcal Polysaccharide-23 03/19/2016   Health maintenance vaccines- discontinued per patient.   Screening Tests Health Maintenance  Topic Date Due   COVID-19 Vaccine (4 - Booster for Pfizer series) 04/15/2022 (Originally 08/25/2020)   FOOT EXAM  07/06/2022 (Originally 03/01/2021)   Hepatitis C Screening  03/31/2023 (Originally 04/28/1969)   INFLUENZA VACCINE  06/05/2022   HEMOGLOBIN A1C  06/18/2022   OPHTHALMOLOGY EXAM  10/06/2022   COLONOSCOPY (Pts 45-7yr Insurance coverage will need to be confirmed)  03/29/2025   DEXA SCAN  Completed   HPV VACCINES  Aged Out   Pneumonia Vaccine 72 Years old  Discontinued   MAMMOGRAM  Discontinued   TETANUS/TDAP  Discontinued   Zoster Vaccines- Shingrix  Discontinued   Health Maintenance There are no preventive care reminders to display for this patient.  Lung Cancer Screening: (Low Dose CT Chest recommended if Age 62-80 years, 30 pack-year currently smoking OR have quit w/in 15years.) does not qualify.   Vision Screening: Recommended annual ophthalmology exams for early detection of glaucoma and other disorders of the eye.  Dental Screening: Recommended annual dental exams for proper oral hygiene  Community Resource Referral / Chronic Care Management: CRR required this visit?  No   CCM required this visit?  No      Plan:   Keep all routine maintenance appointments.   I have personally reviewed and noted the following in the patient's chart:   Medical and social history Use of alcohol, tobacco or illicit drugs  Current medications and supplements including opioid prescriptions.  Functional ability and status Nutritional status Physical activity Advanced directives List of other physicians Hospitalizations, surgeries, and ER visits in previous 12  months Vitals Screenings to include cognitive, depression, and falls Referrals and appointments  In addition, I have reviewed and discussed with patient certain preventive protocols, quality metrics, and best practice recommendations. A written personalized care plan for preventive services as well as general preventive health recommendations were provided to patient.     Varney Biles, LPN   [id number]

## 2022-03-30 NOTE — Patient Instructions (Addendum)
  Melanie Ray , Thank you for taking time to come for your Medicare Wellness Visit. I appreciate your ongoing commitment to your health goals. Please review the following plan we discussed and let me know if I can assist you in the future.   These are the goals we discussed:  Goals       Patient Stated     Increase physical activity (pt-stated)      As tolerated        This is a list of the screening recommended for you and due dates:  Health Maintenance  Topic Date Due   COVID-19 Vaccine (4 - Booster for Pfizer series) 04/15/2022*   Complete foot exam   07/06/2022*   Hepatitis C Screening: USPSTF Recommendation to screen - Ages 18-79 yo.  03/31/2023*   Flu Shot  06/05/2022   Hemoglobin A1C  06/18/2022   Eye exam for diabetics  10/06/2022   Colon Cancer Screening  03/29/2025   DEXA scan (bone density measurement)  Completed   HPV Vaccine  Aged Out   Pneumonia Vaccine  Discontinued   Mammogram  Discontinued   Tetanus Vaccine  Discontinued   Zoster (Shingles) Vaccine  Discontinued  *Topic was postponed. The date shown is not the original due date.

## 2022-04-03 DIAGNOSIS — M25642 Stiffness of left hand, not elsewhere classified: Secondary | ICD-10-CM | POA: Diagnosis not present

## 2022-04-04 ENCOUNTER — Encounter: Payer: Self-pay | Admitting: Cardiology

## 2022-04-04 ENCOUNTER — Ambulatory Visit (INDEPENDENT_AMBULATORY_CARE_PROVIDER_SITE_OTHER): Payer: Medicare Other | Admitting: Cardiology

## 2022-04-04 VITALS — BP 124/74 | HR 78 | Ht 63.0 in | Wt 133.8 lb

## 2022-04-04 DIAGNOSIS — Z95810 Presence of automatic (implantable) cardiac defibrillator: Secondary | ICD-10-CM

## 2022-04-04 DIAGNOSIS — I10 Essential (primary) hypertension: Secondary | ICD-10-CM | POA: Diagnosis not present

## 2022-04-04 DIAGNOSIS — I255 Ischemic cardiomyopathy: Secondary | ICD-10-CM | POA: Diagnosis not present

## 2022-04-04 DIAGNOSIS — I5022 Chronic systolic (congestive) heart failure: Secondary | ICD-10-CM

## 2022-04-04 LAB — PACEMAKER DEVICE OBSERVATION

## 2022-04-04 NOTE — Patient Instructions (Addendum)
Medications: Your physician recommends that you continue on your current medications as directed. Please refer to the Current Medication list given to you today. *If you need a refill on your cardiac medications before your next appointment, please call your pharmacy*  Lab Work: None. If you have labs (blood work) drawn today and your tests are completely normal, you will receive your results only by: [place] (if you have MyChart) OR A paper copy in the mail If you have any lab test that is abnormal or we need to change your treatment, we will call you to review the results.  Testing/Procedures: Your physician has requested that you have an echocardiogram. Echocardiography is a painless test that uses sound waves to create images of your heart. It provides your doctor with information about the size and shape of your heart and how well your heart's chambers and valves are working. This procedure takes approximately one hour. There are no restrictions for this procedure.   Follow-Up: At [facility], you and your health needs are our priority.  As part of our continuing mission to provide you with exceptional heart care, we have created designated Provider Care Teams.  These Care Teams include your primary Cardiologist (physician) and Advanced Practice Providers (APPs -  Physician Assistants and Nurse Practitioners) who all work together to provide you with the care you need, when you need it.  Your physician wants you to follow-up in: 6 months with one of the following Advanced Practice Providers on your designated Care Team:    Ignacia Bayley, NP Christell Faith PA Cadence Kathlen Mody PA  We recommend signing up for the patient portal called "MyChart".  Sign up information is provided on this After Visit Summary.  MyChart is used to connect with patients for Virtual Visits (Telemedicine).  Patients are able to view lab/test results, encounter notes, upcoming appointments, etc.  Non-urgent  messages can be sent to your provider as well.   To learn more about what you can do with MyChart, go to [email or web address].    Any Other Special Instructions Will Be Listed Below (If Applicable).

## 2022-04-04 NOTE — Progress Notes (Signed)
Electrophysiology Office Follow up Visit Note:    Date:  04/04/2022   ID:  Melanie Ray, Melanie Ray [birth year], MRN [id number]  PCP:  Leone Haven, MD  [facility] Cardiologist:  Sherren Mocha, MD  [facility] Electrophysiologist:  Vickie Epley, MD    Interval History:    Melanie Ray is a 71 y.o. female who presents for a follow up visit after her ICD implant 01/02/2022. During the implant an RV/LBB/RA lead were placed. CS cannulation was attempted and a wire was able to be passed but the sheath/inner dilator were never able to enter the main body of the CS--likely secondary to a valve or fenestrated opening. With pacing the QRS narrowed from 165 to 144m.   Today she tells me that her breathing has improved since device implant but not completely back to normal.  Her energy level is improved but again not as much as she would have liked.  No problems with the device healing.  Her biggest complaint today is that she has an uncontrollable pulling sensation with her mouth and jaw.  She has been on Latuda for several years now but this symptom has worsened recently.       Past Medical History:  Diagnosis Date   Anemia    Anxiety    Back pain    CAD (coronary artery disease) 04/11/2016   S/p ant STEMI 5/17: LHC >> LAD proximal 80%, mid 80%, distal 50%, ostial D1 60%; LCx with LPDA lesion 30%; RCA Mild calcification with no significant stenosis in a medium caliber, nondominant RCA; LVEF is estimated at 45% with inferoapical and lateral wall akinesis >> PCI: PCI: 3.5 x 24 mm Promus DES to prox LAD, 2.5 x 12 mm Promus DES to mid LAD. // Myoview 7/21: EF 20, no ischemia; high risk    Chest pain    Chronic systolic CHF (congestive heart failure) ([place] 03/21/2016   Echo 01/30/17: Diff HK, mild focal basal septal hypertrophy, EF 30-35, mild AI, MAC, mild MR // Echo 06/08/16: Mild focal basal septal hypertrophy, EF 25-30%, diff HK, ant-septal AK, Gr 1 DD, mild AI, MAC,  mild MR, PASP 37 mmHg // Echo 03/18/16: EF 30-35%, ant-septal AK, Gr 1 DD, mild MR, severe LAE.   Constipation    Depression    Diabetes mellitus type 2 in obese (HCC)    Dizziness    Glaucoma    Gout    Heart disease    Heartburn    History of acute anterior wall MI 03/17/2016   History of heart attack    Hyperlipemia    Hypertension    Ischemic cardiomyopathy 10/02/2016   Refused ICD   Joint pain    Left bundle branch block    Myocardial infarction ([place]    Nausea    Positive colorectal cancer screening using Cologuard test 03/01/2020   Sleep apnea    SOB (shortness of breath)    Suicidal ideation 08/27/2018   Swelling    feet or legs    Past Surgical History:  Procedure Laterality Date   APPENDECTOMY     BIV ICD INSERTION CRT-D N/A 01/02/2022   Procedure: BIV ICD INSERTION CRT-D;  Surgeon: LVickie Epley MD;  [facility];  Service: Cardiovascular;  Laterality: N/A;   CARDIAC CATHETERIZATION N/A 03/17/2016   Procedure: Left Heart Cath and Coronary Angiography;  Surgeon: MSherren Mocha MD;  [facility];  Service: Cardiovascular;  Laterality: N/A;   CARDIAC CATHETERIZATION  N/A 03/17/2016   Procedure: Coronary Stent Intervention;  Surgeon: Sherren Mocha, MD;  Location: [place] [facility];  Service: Cardiovascular;  Laterality: N/A;   COLONOSCOPY WITH PROPOFOL N/A 08/13/2017   Procedure: COLONOSCOPY WITH PROPOFOL;  Surgeon: Jonathon Bellows, MD;  Location: [facility] ENDOSCOPY;  Service: Gastroenterology;  Laterality: N/A;   COLONOSCOPY WITH PROPOFOL N/A 03/29/2020   Procedure: COLONOSCOPY WITH PROPOFOL;  Surgeon: Lucilla Lame, MD;  Location: [facility] ENDOSCOPY;  Service: Endoscopy;  Laterality: N/A;   SMALL BOWEL REPAIR     TONSILLECTOMY     UTERINE FIBROID SURGERY      Current Medications: Current Meds  Medication Sig   atorvastatin (LIPITOR) 80 MG tablet Take 1 tablet by mouth once daily   carvedilol (COREG) 12.5 MG tablet TAKE 1 TABLET BY MOUTH  TWICE DAILY WITH MEALS   clopidogrel (PLAVIX) 75 MG tablet Take 1 tablet by mouth once daily   dorzolamide-timolol (COSOPT) 22.3-6.8 MG/ML ophthalmic solution Place 1 drop into both eyes 2 (two) times daily.    isosorbide mononitrate (IMDUR) 30 MG 24 hr tablet Take 1 tablet (30 mg total) by mouth daily.   latanoprost (XALATAN) 0.005 % ophthalmic solution Place 1 drop into both eyes at bedtime.   LORazepam (ATIVAN) 0.5 MG tablet Take 0.5 mg by mouth daily as needed for anxiety.   lurasidone (LATUDA) 40 MG TABS tablet Take 40 mg by mouth daily with breakfast.   nitroGLYCERIN (NITROSTAT) 0.4 MG SL tablet DISSOLVE ONE TABLET UNDER THE TONGUE EVERY FIVE MINUTES AS NEEDED FOR CHEST PAIN. DO NOT EXCEED A TOTAL OF THREE DOSES IN 15 MINUTES   traZODone (DESYREL) 150 MG tablet Take 150 mg by mouth at bedtime.   valsartan (DIOVAN) 80 MG tablet Take 80 mg by mouth daily.   venlafaxine XR (EFFEXOR-XR) 150 MG 24 hr capsule TAKE 2 CAPSULES BY MOUTH ONCE DAILY (NEEDS to follow-up with psychiatry for further refills)     Allergies:   Tetracycline, Meperidine, and Entresto [sacubitril-valsartan]   Social History   Socioeconomic History   Marital status: Married    Spouse name: Lanita Stammen   Number of children: 0   Years of education: BS in education   Highest education level: Not on file  Occupational History   Occupation:  Retired Education officer, museum  Tobacco Use   Smoking status: Former    Types: Cigarettes    Quit date: 04/13/1997    Years since quitting: 24.9   Smokeless tobacco: Never  Vaping Use   Vaping Use: Never used  Substance and Sexual Activity   Alcohol use: Not Currently   Drug use: No   Sexual activity: Not Currently  Other Topics Concern   Not on file  Social History Narrative   Lives in [place] with spouse.  No children.   Retired first [employer] for over 30 years ([place] for 10 years and then in [place] for over 20 years).   Left-handed   Lives in a two story  home       Social Determinants of Health   Financial Resource Strain: Low Risk    Difficulty of Paying Living Expenses: Not hard at all  Food Insecurity: No Food Insecurity   Worried About Charity fundraiser in the Last Year: Never true   Arboriculturist in the Last Year: Never true  Transportation Needs: No Transportation Needs   Lack of Transportation (Medical): No   Lack of Transportation (Non-Medical): No  Physical Activity: Not on file  Stress:  No Stress Concern Present   Feeling of Stress : Not at all  Social Connections: Unknown   Frequency of Communication with Friends and Family: Not on file   Frequency of Social Gatherings with Friends and Family: Not on file   Attends Religious Services: Not on Electrical engineer or Organizations: Not on file   Attends Archivist Meetings: Not on file   Marital Status: Married     Family History: The patient's family history includes Anxiety disorder in her maternal aunt and mother; Dementia in her mother; Depression in her mother; Diabetes in her mother; Drug abuse in her cousin; High Cholesterol in her father and mother; Hyperlipidemia in her mother; Hypertension in her father and mother; Mood Disorder in her sister; Paranoid behavior in her mother; Stroke in her sister; Tuberculosis in her paternal grandfather.  ROS:   Please see the history of present illness.    All other systems reviewed and are negative.  EKGs/Labs/Other Studies Reviewed:    The following studies were reviewed today:  04/04/2022 in clinic device interrogation personally reviewed Battery longevity okay.  Lead parameters stable.  RV programmed subthreshold given left bundle lead in place.  EKG:  The ekg ordered today demonstrates AV sequential pacing.  QRS duration 100 ms.  Recent Labs: 09/05/2021: TSH 0.64 11/15/2021: B Natriuretic Peptide 980.3 12/19/2021: Pro B Natriuretic peptide (BNP) 2,342.0 12/26/2021: BUN 21; Creatinine, Ser 1.21;  Potassium 3.7; Sodium 137 01/30/2022: ALT 19 02/08/2022: Hemoglobin 10.3; Platelets 292  Recent Lipid Panel    Component Value Date/Time   CHOL 139 12/19/2021 1107   CHOL 112 04/18/2020 0815   TRIG 71.0 12/19/2021 1107   HDL 41.60 12/19/2021 1107   HDL 39 (L) 04/18/2020 0815   CHOLHDL 3 12/19/2021 1107   VLDL 14.2 12/19/2021 1107   LDLCALC 83 12/19/2021 1107   LDLCALC 59 04/18/2020 0815   LDLDIRECT 78.0 02/07/2018 0928    Physical Exam:    VS:  BP 124/74   Pulse 78   Ht _0  (1.6 m)   Wt 133 lb 12.8 oz (60.7 kg)   SpO2 99%   BMI 23.70 kg/m     Wt Readings from Last 3 Encounters:  04/04/22 133 lb 12.8 oz (60.7 kg)  03/30/22 130 lb (59 kg)  03/15/22 130 lb 12.8 oz (59.3 kg)     GEN:  Well nourished, well developed in no acute distress HEENT: Normal NECK: No JVD; No carotid bruits LYMPHATICS: No lymphadenopathy CARDIAC: RRR, no murmurs, rubs, gallops.  ICD pocket well-healed RESPIRATORY:  Clear to auscultation without rales, wheezing or rhonchi  ABDOMEN: Soft, non-tender, non-distended MUSCULOSKELETAL:  No edema; No deformity  SKIN: Warm and dry NEUROLOGIC:  Alert and oriented x 3.  Tar dive dyskinesia/irregular movements of the mouth and jaw PSYCHIATRIC:  Normal affect        ASSESSMENT:    1. Chronic systolic CHF (congestive heart failure) ([place])   2. Essential hypertension   3. Ischemic cardiomyopathy   4. ICD (implantable cardioverter-defibrillator) in place    PLAN:    In order of problems listed above:    #Chronic systolic heart failure #ICD in situ NYHA class II-III.  Warm and euvolemic on exam today.  Continue Coreg, Imdur, valsartan.  ICD functioning appropriately.  I will repeat her echocardiogram today given is been several months since the device was implanted.  Continue remote monitoring  #Tardive dyskinesia I have asked her to call her psychiatrist office today to  discuss the worsening of her symptoms.    Follow-up in 6 months or sooner  as needed.  APP appointment okay.    Medication Adjustments/Labs and Tests Ordered: Current medicines are reviewed at length with the patient today.  Concerns regarding medicines are outlined above.  No orders of the defined types were placed in this encounter.  No orders of the defined types were placed in this encounter.    Signed, Lars Mage, MD, [facility], [facility] 04/04/2022 12:00 PM    Electrophysiology [facility]

## 2022-04-05 ENCOUNTER — Other Ambulatory Visit: Payer: Self-pay | Admitting: Cardiology

## 2022-04-05 DIAGNOSIS — I255 Ischemic cardiomyopathy: Secondary | ICD-10-CM

## 2022-04-05 DIAGNOSIS — I5022 Chronic systolic (congestive) heart failure: Secondary | ICD-10-CM

## 2022-04-05 DIAGNOSIS — Z95810 Presence of automatic (implantable) cardiac defibrillator: Secondary | ICD-10-CM

## 2022-04-05 LAB — CUP PACEART INCLINIC DEVICE CHECK
Battery Remaining Longevity: 75 mo
Brady Statistic RA Percent Paced: 93 %
Brady Statistic RV Percent Paced: 99.34 %
Date Time Interrogation Session: 20230531115800
HighPow Impedance: 50.625
Implantable Lead Implant Date: 20230228
Implantable Lead Implant Date: 20230228
Implantable Lead Implant Date: 20230228
Implantable Lead Location: 753858
Implantable Lead Location: 753859
Implantable Lead Location: 753860
Implantable Lead Model: 3830
Implantable Pulse Generator Implant Date: 20230228
Lead Channel Impedance Value: 425 Ohm
Lead Channel Impedance Value: 425 Ohm
Lead Channel Impedance Value: 625 Ohm
Lead Channel Pacing Threshold Amplitude: 0.5 V
Lead Channel Pacing Threshold Amplitude: 0.5 V
Lead Channel Pacing Threshold Amplitude: 0.625 V
Lead Channel Pacing Threshold Amplitude: 0.75 V
Lead Channel Pacing Threshold Amplitude: 0.75 V
Lead Channel Pacing Threshold Amplitude: 0.75 V
Lead Channel Pacing Threshold Pulse Width: 0.4 ms
Lead Channel Pacing Threshold Pulse Width: 0.4 ms
Lead Channel Pacing Threshold Pulse Width: 0.5 ms
Lead Channel Pacing Threshold Pulse Width: 0.5 ms
Lead Channel Pacing Threshold Pulse Width: 0.5 ms
Lead Channel Pacing Threshold Pulse Width: 0.5 ms
Lead Channel Sensing Intrinsic Amplitude: 12 mV
Lead Channel Sensing Intrinsic Amplitude: 5 mV
Lead Channel Setting Pacing Amplitude: 0.25 V
Lead Channel Setting Pacing Amplitude: 2 V
Lead Channel Setting Pacing Amplitude: 2 V
Lead Channel Setting Pacing Pulse Width: 0.05 ms
Lead Channel Setting Pacing Pulse Width: 0.5 ms
Lead Channel Setting Sensing Sensitivity: 0.5 mV
Pulse Gen Serial Number: 8904264

## 2022-04-06 ENCOUNTER — Ambulatory Visit (INDEPENDENT_AMBULATORY_CARE_PROVIDER_SITE_OTHER): Payer: Medicare Other

## 2022-04-06 DIAGNOSIS — I255 Ischemic cardiomyopathy: Secondary | ICD-10-CM

## 2022-04-06 LAB — CUP PACEART REMOTE DEVICE CHECK
Battery Remaining Longevity: 81 mo
Battery Remaining Percentage: 90 %
Battery Voltage: 3.1 V
Brady Statistic AP VP Percent: 93 %
Brady Statistic AP VS Percent: 1 %
Brady Statistic AS VP Percent: 6 %
Brady Statistic AS VS Percent: 1 %
Brady Statistic RA Percent Paced: 93 %
Date Time Interrogation Session: 20230602020018
HighPow Impedance: 48 Ohm
HighPow Impedance: 48 Ohm
Implantable Lead Implant Date: 20230228
Implantable Lead Implant Date: 20230228
Implantable Lead Implant Date: 20230228
Implantable Lead Location: 753858
Implantable Lead Location: 753859
Implantable Lead Location: 753860
Implantable Lead Model: 3830
Implantable Pulse Generator Implant Date: 20230228
Lead Channel Impedance Value: 430 Ohm
Lead Channel Impedance Value: 430 Ohm
Lead Channel Impedance Value: 630 Ohm
Lead Channel Pacing Threshold Amplitude: 0.5 V
Lead Channel Pacing Threshold Amplitude: 0.75 V
Lead Channel Pacing Threshold Amplitude: 0.75 V
Lead Channel Pacing Threshold Pulse Width: 0.4 ms
Lead Channel Pacing Threshold Pulse Width: 0.5 ms
Lead Channel Pacing Threshold Pulse Width: 0.5 ms
Lead Channel Sensing Intrinsic Amplitude: 12 mV
Lead Channel Sensing Intrinsic Amplitude: 5 mV
Lead Channel Setting Pacing Amplitude: 0.25 V
Lead Channel Setting Pacing Amplitude: 2 V
Lead Channel Setting Pacing Amplitude: 2 V
Lead Channel Setting Pacing Pulse Width: 0.05 ms
Lead Channel Setting Pacing Pulse Width: 0.5 ms
Lead Channel Setting Sensing Sensitivity: 0.5 mV
Pulse Gen Serial Number: 8904264

## 2022-04-11 ENCOUNTER — Ambulatory Visit (INDEPENDENT_AMBULATORY_CARE_PROVIDER_SITE_OTHER): Payer: Medicare Other

## 2022-04-11 DIAGNOSIS — Z95810 Presence of automatic (implantable) cardiac defibrillator: Secondary | ICD-10-CM

## 2022-04-11 DIAGNOSIS — I5022 Chronic systolic (congestive) heart failure: Secondary | ICD-10-CM

## 2022-04-11 DIAGNOSIS — I255 Ischemic cardiomyopathy: Secondary | ICD-10-CM | POA: Diagnosis not present

## 2022-04-11 LAB — ECHOCARDIOGRAM COMPLETE
Area-P 1/2: 3.13 cm2
Calc EF: 38.2 %
MV M vel: 4.82 m/s
MV Peak grad: 92.9 mmHg
P 1/2 time: 415 msec
Radius: 0.7 cm
S' Lateral: 4.9 cm
Single Plane A2C EF: 45 %
Single Plane A4C EF: 30.2 %

## 2022-04-13 NOTE — Progress Notes (Signed)
Remote ICD transmission.   

## 2022-04-16 DIAGNOSIS — F251 Schizoaffective disorder, depressive type: Secondary | ICD-10-CM | POA: Diagnosis not present

## 2022-04-16 DIAGNOSIS — Z79899 Other long term (current) drug therapy: Secondary | ICD-10-CM | POA: Diagnosis not present

## 2022-04-17 ENCOUNTER — Other Ambulatory Visit: Payer: Self-pay | Admitting: Cardiovascular Disease

## 2022-04-17 DIAGNOSIS — M25642 Stiffness of left hand, not elsewhere classified: Secondary | ICD-10-CM | POA: Diagnosis not present

## 2022-04-29 ENCOUNTER — Other Ambulatory Visit: Payer: Self-pay | Admitting: Family Medicine

## 2022-04-29 DIAGNOSIS — I639 Cerebral infarction, unspecified: Secondary | ICD-10-CM

## 2022-05-02 ENCOUNTER — Telehealth: Payer: Self-pay | Admitting: Family Medicine

## 2022-05-02 NOTE — Telephone Encounter (Signed)
Please call the patient and see what issue she is currently having. She will need to be seen somewhere for this issue.

## 2022-05-02 NOTE — Telephone Encounter (Signed)
Noted. Please let me know the outcome of the access nurse call.

## 2022-05-02 NOTE — Telephone Encounter (Signed)
Pt called in stating her feet has been swollen for a week, one is worst then the other one. Sent to access nurse

## 2022-05-02 NOTE — Telephone Encounter (Signed)
Adriana called from Access Nurse to state the recommendation was to send patient to the ED.  Fabio Bering states patient refused to go to the ED.  Adriana transferred patient's call back to me.  I tried to transfer patient to Fulton Mole, CMA, but she is currently unavailable.  Please call.

## 2022-05-03 DIAGNOSIS — M25642 Stiffness of left hand, not elsewhere classified: Secondary | ICD-10-CM | POA: Diagnosis not present

## 2022-05-03 NOTE — Telephone Encounter (Signed)
Patient is scheduled to see provider Monday.  Melanie Ray,cma

## 2022-05-07 ENCOUNTER — Inpatient Hospital Stay: Payer: Medicare Other

## 2022-05-07 ENCOUNTER — Ambulatory Visit (INDEPENDENT_AMBULATORY_CARE_PROVIDER_SITE_OTHER): Payer: Medicare Other | Admitting: Family Medicine

## 2022-05-07 ENCOUNTER — Encounter: Payer: Self-pay | Admitting: Family Medicine

## 2022-05-07 ENCOUNTER — Emergency Department: Payer: Medicare Other

## 2022-05-07 ENCOUNTER — Other Ambulatory Visit: Payer: Self-pay

## 2022-05-07 ENCOUNTER — Inpatient Hospital Stay
Admission: EM | Admit: 2022-05-07 | Discharge: 2022-05-09 | DRG: 291 | Disposition: A | Discharge status: Home Health | Payer: Medicare Other | Attending: Osteopathic Medicine | Admitting: Osteopathic Medicine

## 2022-05-07 ENCOUNTER — Other Ambulatory Visit
Admission: RE | Admit: 2022-05-07 | Discharge: 2022-05-07 | Disposition: A | Discharge status: Home / Self Care | Payer: Medicare Other | Source: Ambulatory Visit | Attending: *Deleted | Admitting: *Deleted

## 2022-05-07 VITALS — BP 130/80 | HR 88 | Temp 98.5°F | Ht 63.0 in | Wt 136.4 lb

## 2022-05-07 DIAGNOSIS — I5023 Acute on chronic systolic (congestive) heart failure: Secondary | ICD-10-CM | POA: Diagnosis present

## 2022-05-07 DIAGNOSIS — I110 Hypertensive heart disease with heart failure: Principal | ICD-10-CM | POA: Diagnosis present

## 2022-05-07 DIAGNOSIS — Z79899 Other long term (current) drug therapy: Secondary | ICD-10-CM | POA: Diagnosis not present

## 2022-05-07 DIAGNOSIS — I255 Ischemic cardiomyopathy: Secondary | ICD-10-CM | POA: Diagnosis present

## 2022-05-07 DIAGNOSIS — E785 Hyperlipidemia, unspecified: Secondary | ICD-10-CM | POA: Diagnosis present

## 2022-05-07 DIAGNOSIS — Z888 Allergy status to other drugs, medicaments and biological substances status: Secondary | ICD-10-CM

## 2022-05-07 DIAGNOSIS — Z833 Family history of diabetes mellitus: Secondary | ICD-10-CM | POA: Diagnosis not present

## 2022-05-07 DIAGNOSIS — F419 Anxiety disorder, unspecified: Secondary | ICD-10-CM

## 2022-05-07 DIAGNOSIS — E782 Mixed hyperlipidemia: Secondary | ICD-10-CM | POA: Diagnosis not present

## 2022-05-07 DIAGNOSIS — I340 Nonrheumatic mitral (valve) insufficiency: Secondary | ICD-10-CM | POA: Diagnosis present

## 2022-05-07 DIAGNOSIS — Z91148 Patient's other noncompliance with medication regimen for other reason: Secondary | ICD-10-CM | POA: Diagnosis not present

## 2022-05-07 DIAGNOSIS — I10 Essential (primary) hypertension: Secondary | ICD-10-CM | POA: Diagnosis present

## 2022-05-07 DIAGNOSIS — Z8249 Family history of ischemic heart disease and other diseases of the circulatory system: Secondary | ICD-10-CM | POA: Diagnosis not present

## 2022-05-07 DIAGNOSIS — J9811 Atelectasis: Secondary | ICD-10-CM | POA: Diagnosis not present

## 2022-05-07 DIAGNOSIS — H409 Unspecified glaucoma: Secondary | ICD-10-CM | POA: Diagnosis not present

## 2022-05-07 DIAGNOSIS — G4733 Obstructive sleep apnea (adult) (pediatric): Secondary | ICD-10-CM | POA: Diagnosis present

## 2022-05-07 DIAGNOSIS — I5043 Acute on chronic combined systolic (congestive) and diastolic (congestive) heart failure: Secondary | ICD-10-CM | POA: Diagnosis not present

## 2022-05-07 DIAGNOSIS — I447 Left bundle-branch block, unspecified: Secondary | ICD-10-CM | POA: Diagnosis present

## 2022-05-07 DIAGNOSIS — Z87891 Personal history of nicotine dependence: Secondary | ICD-10-CM | POA: Diagnosis not present

## 2022-05-07 DIAGNOSIS — R0609 Other forms of dyspnea: Secondary | ICD-10-CM

## 2022-05-07 DIAGNOSIS — I25118 Atherosclerotic heart disease of native coronary artery with other forms of angina pectoris: Secondary | ICD-10-CM | POA: Diagnosis present

## 2022-05-07 DIAGNOSIS — R0602 Shortness of breath: Principal | ICD-10-CM

## 2022-05-07 DIAGNOSIS — Z83438 Family history of other disorder of lipoprotein metabolism and other lipidemia: Secondary | ICD-10-CM

## 2022-05-07 DIAGNOSIS — Z823 Family history of stroke: Secondary | ICD-10-CM | POA: Diagnosis not present

## 2022-05-07 DIAGNOSIS — R7303 Prediabetes: Secondary | ICD-10-CM

## 2022-05-07 DIAGNOSIS — E119 Type 2 diabetes mellitus without complications: Secondary | ICD-10-CM | POA: Diagnosis present

## 2022-05-07 DIAGNOSIS — F429 Obsessive-compulsive disorder, unspecified: Secondary | ICD-10-CM | POA: Diagnosis not present

## 2022-05-07 DIAGNOSIS — I5022 Chronic systolic (congestive) heart failure: Secondary | ICD-10-CM | POA: Diagnosis not present

## 2022-05-07 DIAGNOSIS — R0689 Other abnormalities of breathing: Secondary | ICD-10-CM | POA: Diagnosis not present

## 2022-05-07 DIAGNOSIS — F32A Depression, unspecified: Secondary | ICD-10-CM | POA: Diagnosis present

## 2022-05-07 DIAGNOSIS — J90 Pleural effusion, not elsewhere classified: Secondary | ICD-10-CM | POA: Diagnosis not present

## 2022-05-07 DIAGNOSIS — J9601 Acute respiratory failure with hypoxia: Secondary | ICD-10-CM | POA: Diagnosis present

## 2022-05-07 DIAGNOSIS — I509 Heart failure, unspecified: Secondary | ICD-10-CM | POA: Diagnosis not present

## 2022-05-07 DIAGNOSIS — Z881 Allergy status to other antibiotic agents status: Secondary | ICD-10-CM

## 2022-05-07 DIAGNOSIS — I5021 Acute systolic (congestive) heart failure: Secondary | ICD-10-CM | POA: Diagnosis not present

## 2022-05-07 DIAGNOSIS — Z955 Presence of coronary angioplasty implant and graft: Secondary | ICD-10-CM

## 2022-05-07 DIAGNOSIS — Z7984 Long term (current) use of oral hypoglycemic drugs: Secondary | ICD-10-CM | POA: Diagnosis not present

## 2022-05-07 DIAGNOSIS — I252 Old myocardial infarction: Secondary | ICD-10-CM

## 2022-05-07 DIAGNOSIS — Z7902 Long term (current) use of antithrombotics/antiplatelets: Secondary | ICD-10-CM

## 2022-05-07 DIAGNOSIS — Z8673 Personal history of transient ischemic attack (TIA), and cerebral infarction without residual deficits: Secondary | ICD-10-CM

## 2022-05-07 DIAGNOSIS — I213 ST elevation (STEMI) myocardial infarction of unspecified site: Secondary | ICD-10-CM | POA: Diagnosis not present

## 2022-05-07 LAB — TROPONIN I (HIGH SENSITIVITY)
Troponin I (High Sensitivity): 20 ng/L — ABNORMAL HIGH (ref <18)
Troponin I (High Sensitivity): 17 ng/L (ref <18)
Troponin I (High Sensitivity): 23 ng/L — ABNORMAL HIGH (ref <18)

## 2022-05-07 LAB — CBC
HCT: 41.5 % (ref 36.0–46.0)
HCT: 39.1 % (ref 36.0–46.0)
Hemoglobin: 13 g/dL (ref 12.0–15.0)
Hemoglobin: 12.4 g/dL (ref 12.0–15.0)
MCH: 26.2 pg (ref 26.0–34.0)
MCH: 26.7 pg (ref 26.0–34.0)
MCHC: 31.3 g/dL (ref 30.0–36.0)
MCHC: 31.7 g/dL (ref 30.0–36.0)
MCV: 83.5 fL (ref 80.0–100.0)
MCV: 84.3 fL (ref 80.0–100.0)
Platelets: 325 10*3/uL (ref 150–400)
Platelets: 263 10*3/uL (ref 150–400)
RBC: 4.97 MIL/uL (ref 3.87–5.11)
RBC: 4.64 MIL/uL (ref 3.87–5.11)
RDW: 15.4 % (ref 11.5–15.5)
RDW: 15.8 % — ABNORMAL HIGH (ref 11.5–15.5)
WBC: 9.4 10*3/uL (ref 4.0–10.5)
WBC: 8 10*3/uL (ref 4.0–10.5)
nRBC: 0 % (ref 0.0–0.2)
nRBC: 0 % (ref 0.0–0.2)

## 2022-05-07 LAB — BASIC METABOLIC PANEL
Anion gap: 7 (ref 5–15)
Anion gap: 6 (ref 5–15)
BUN: 23 mg/dL (ref 8–23)
BUN: 22 mg/dL (ref 8–23)
CO2: 23 mmol/L (ref 22–32)
CO2: 21 mmol/L — ABNORMAL LOW (ref 22–32)
Calcium: 9.4 mg/dL (ref 8.9–10.3)
Calcium: 8.8 mg/dL — ABNORMAL LOW (ref 8.9–10.3)
Chloride: 112 mmol/L — ABNORMAL HIGH (ref 98–111)
Chloride: 115 mmol/L — ABNORMAL HIGH (ref 98–111)
Creatinine, Ser: 1.1 mg/dL — ABNORMAL HIGH (ref 0.44–1.00)
Creatinine, Ser: 0.88 mg/dL (ref 0.44–1.00)
GFR, Estimated: 53 mL/min — ABNORMAL LOW (ref >60)
GFR, Estimated: >60 mL/min (ref >60)
Glucose, Bld: 149 mg/dL — ABNORMAL HIGH (ref 70–99)
Glucose, Bld: 103 mg/dL — ABNORMAL HIGH (ref 70–99)
Potassium: 4.3 mmol/L (ref 3.5–5.1)
Potassium: 4 mmol/L (ref 3.5–5.1)
Sodium: 142 mmol/L (ref 135–145)
Sodium: 142 mmol/L (ref 135–145)

## 2022-05-07 LAB — BRAIN NATRIURETIC PEPTIDE
B Natriuretic Peptide: >4500 pg/mL — ABNORMAL HIGH (ref 0.0–100.0)
B Natriuretic Peptide: 2597.3 pg/mL — ABNORMAL HIGH (ref 0.0–100.0)

## 2022-05-07 MED ORDER — FUROSEMIDE 10 MG/ML IJ SOLN
60.0000 mg | Freq: Once | INTRAMUSCULAR | Status: AC
  Administered 2022-05-07: 60 mg via INTRAVENOUS
  Filled 2022-05-07: qty 8

## 2022-05-07 MED ORDER — FUROSEMIDE 20 MG PO TABS: 20.0000 mg | ORAL_TABLET | Freq: Every day | ORAL | 0 refills | Status: DC

## 2022-05-07 NOTE — Progress Notes (Signed)
Tommi Rumps, MD Phone: [phone]  Melanie Ray is a 71 y.o. female who presents today for same-day visit.  Exertion/leg swelling: Patient notes that she has felt dyspneic for about 3 weeks.  She has been on carvedilol, Imdur, and valsartan for chronic heart failure.  She is no longer on spironolactone given acute kidney injury.  She is not on any diuretics.  She has felt short of breath 24/7 over the past 3 weeks.  She has no chest pain.  She does note orthopnea and PND.  She notes her feet and legs have been swelling over the last 2 weeks.  She notes all of this started when "fevers" started back.  Her sister-in-law died in 02-02-2010 and that struck her very hard.  She was talking to her nephew and mention something about her niece's boyfriend that sparked the fears regarding her sister-in-law's prior death.  Since then she has been dealing with the shortness of breath.  The patient has been avoiding going to the hospital or urgent care prior to now given that she has a husband with dementia and a dog that needs insulin twice daily.  Patient notes no suicidal ideation.  Social History   Tobacco Use  Smoking Status Former   Types: Cigarettes   Quit date: 04/13/1997   Years since quitting: 25.0  Smokeless Tobacco Never    Current Outpatient Medications on File Prior to Visit  Medication Sig Dispense Refill   atorvastatin (LIPITOR) 80 MG tablet Take 1 tablet by mouth once daily 90 tablet 3   carvedilol (COREG) 12.5 MG tablet TAKE 1 TABLET BY MOUTH TWICE DAILY WITH MEALS 60 tablet 0   clopidogrel (PLAVIX) 75 MG tablet Take 1 tablet by mouth once daily 90 tablet 0   dorzolamide-timolol (COSOPT) 22.3-6.8 MG/ML ophthalmic solution Place 1 drop into both eyes 2 (two) times daily.      isosorbide mononitrate (IMDUR) 30 MG 24 hr tablet Take 1 tablet (30 mg total) by mouth daily. 90 tablet 3   latanoprost (XALATAN) 0.005 % ophthalmic solution Place 1 drop into both eyes at bedtime.      LORazepam (ATIVAN) 0.5 MG tablet Take 0.5 mg by mouth daily as needed for anxiety.     lurasidone (LATUDA) 40 MG TABS tablet Take 40 mg by mouth daily with breakfast.     nitroGLYCERIN (NITROSTAT) 0.4 MG SL tablet DISSOLVE ONE TABLET UNDER THE TONGUE EVERY FIVE MINUTES AS NEEDED FOR CHEST PAIN. DO NOT EXCEED A TOTAL OF THREE DOSES IN 15 MINUTES 25 tablet 4   traZODone (DESYREL) 150 MG tablet Take 150 mg by mouth at bedtime.     valsartan (DIOVAN) 80 MG tablet Take 80 mg by mouth daily.     venlafaxine XR (EFFEXOR-XR) 150 MG 24 hr capsule TAKE 2 CAPSULES BY MOUTH ONCE DAILY (NEEDS to follow-up with psychiatry for further refills) 60 capsule 0   No current facility-administered medications on file prior to visit.     ROS see history of present illness  Objective  Physical Exam Vitals:   05/07/22 1503  BP: 130/80  Pulse: 88  Temp: 98.5 F (36.9 C)  SpO2: 96%    BP Readings from Last 3 Encounters:  05/07/22 130/80  04/04/22 124/74  03/15/22 130/64   Wt Readings from Last 3 Encounters:  05/07/22 136 lb 6.4 oz (61.9 kg)  04/04/22 133 lb 12.8 oz (60.7 kg)  03/30/22 130 lb (59 kg)    Physical Exam Constitutional:  General: She is not in acute distress.    Appearance: She is not diaphoretic.  Cardiovascular:     Rate and Rhythm: Normal rate and regular rhythm.     Heart sounds: Normal heart sounds.  Pulmonary:     Effort: Pulmonary effort is normal.     Breath sounds: Normal breath sounds.  Musculoskeletal:     Comments: 2+ pitting edema right foot and ankle, 1+ pitting edema left foot and ankle  Skin:    General: Skin is warm and dry.  Neurological:     Mental Status: She is alert.   EKG: Atrial paced rhythm, new T wave inversions in leads V4 and V5, there is some ST depression in those leads as well though that appears to be seen on her prior EKG, other changes appear stable  Ambulatory oxygen saturation 94%  Assessment/Plan: Please see individual problem  list.  Problem List Items Addressed This Visit     Anxiety (Chronic)    Patient has increased anxiety recently.  Discussed that she needs to contact her psychiatrist to discuss appropriate treatment options for this as I am not sure what I would treat her acute anxiety with.  I would not recommend any benzodiazepines given her age and her current breathing status.  Hydroxyzine is not an option given her history of QT prolongation and cardiac issues.  Discussed that her psychiatrist should be able to offer some guidance on what to do for her anxiety.      Chronic systolic CHF (congestive heart failure) (HCC) (Chronic)    I am concerned that her current symptoms are related to fluid accumulation from her CHF.  EKG as outlined.  It is difficult to tell if these are acute changes or if they occurred 3 weeks ago when her symptoms started.  Given the changes in leads V4 and V5 in combination with her symptoms I did advise emergency room evaluation though the patient is concerned about her husband with dementia and her dog and preferred to have an outpatient work-up first.  Discussed the risk of an adverse event happening to her where there would not be immediate assistance.  [facility] will [facility] mall to have lab work drawn so that we get this back as quickly as possible.  We will start her on Lasix 20 mg daily to help with fluid accumulation.  She will follow-up with me later this week.  She will go to the emergency department if she develops worsening shortness of breath, chest pain, or any new or changing symptoms.      Relevant Medications   furosemide (LASIX) 20 MG tablet   Other Visit Diagnoses     DOE (dyspnea on exertion)    -  Primary   Relevant Medications   furosemide (LASIX) 20 MG tablet   Other Relevant Orders   EKG 12-Lead (Completed)   B Nat Peptide   Basic Metabolic Panel (BMET)   CBC   Troponin I (High Sensitivity)       Return in 4 days (on 05/11/2022) for Okay to  schedule in the 11 AM slot.  Addendum: on review of chart the patient was to be advised on 01/02/22 to reduce her lasix to 20 mg daily given AKI and to have labs rechecked one week after this reduction. CMA was not able to contact the patient initially and had to leave a voicemail. Patient called back on 01/16/22 and it appears that she self discontinued the lasix prior to 01/16/22  when the Becker spoke with her given her renal function. It is unclear if the prior instructions to reduce lasix to 20 mg daily were relayed to the patient and it appears that her response may not have been sent to me to review. It appears she never had the follow-up BMET.    Tommi Rumps, MD [place]

## 2022-05-07 NOTE — Patient Instructions (Addendum)
Nice to see you. Please start the Lasix 20 mg daily.  I will see you back later this week to recheck how you are doing. Please contact your psychiatrist to let them know how your anxiety is doing. If you develop any worsening shortness of breath, chest pain, or any new symptoms please seek medical attention immediately. Please go to the [facility] lab immediately to have your labs done.

## 2022-05-07 NOTE — ED Notes (Signed)
Provided pt with sandwich tray per admitting MD request. Also tried to call pt husband with no answer

## 2022-05-07 NOTE — ED Notes (Signed)
Pt placed on 2L [place] at this time per verbal order EDP Paduchowski.

## 2022-05-07 NOTE — ED Provider Notes (Addendum)
[facility] Provider Note    Event Date/Time   First MD Initiated Contact with Patient 05/07/22 1923     (approximate)  History   Chief Complaint: Shortness of Breath  HPI  Melanie Ray is a 71 y.o. female with a past medical history of anxiety, anemia, CAD, CHF, diabetes, hypertension, hyperlipidemia, presents emergency department for shortness of breath.  According to the patient over the past 3 weeks she has been feeling short of breath with lower extremity edema and worsening shortness of breath especially when she lies down at night.  Patient was seen at her doctor earlier today had blood work performed showing a BNP greater than 4500 and elevated troponin and the patient was told to come to the emergency department for evaluation.  Patient denies any chest pain.  States mild cough but denies any fever.  Physical Exam   Triage Vital Signs: ED Triage Vitals  Enc Vitals Group     BP 05/07/22 1846 (!) 152/75     Pulse Rate 05/07/22 1846 78     Resp 05/07/22 1846 (!) 24     Temp 05/07/22 1846 98.9 F (37.2 C)     Temp Source 05/07/22 1846 Oral     SpO2 05/07/22 1846 96 %     Weight 05/07/22 1841 135 lb (61.2 kg)     Height 05/07/22 1841 '5\' 3"'$  (1.6 m)     Head Circumference --      Peak Flow --      Pain Score 05/07/22 1841 0     Pain Loc --      Pain Edu? --      Excl. in [place]? --     Most recent vital signs: Vitals:   05/07/22 1846 05/07/22 1900  BP: (!) 152/75 (!) 156/70  Pulse: 78 79  Resp: (!) 24 (!) 26  Temp: 98.9 F (37.2 C)   SpO2: 96% 96%    General: Awake, no distress.  CV:  Good peripheral perfusion.  Regular rate and rhythm  Resp:  Normal effort.  Equal breath sounds bilaterally.  Abd:  No distention.  Soft, nontender.  No rebound or guarding. Other:  Mild lower extremity edema bilaterally.   ED Results / Procedures / Treatments   EKG  EKG viewed and interpreted by myself shows what appears to be a sinus rhythm at  72 bpm with a widened QRS, normal axis, normal intervals nonspecific ST changes.  Left bundle branch block.  No ST elevation.  RADIOLOGY  I have viewed and interpreted the patient's chest x-ray does appear to have bilateral pulmonary edema my examination. Radiology is read cardiomegaly with atelectasis.   MEDICATIONS ORDERED IN ED: Medications - No data to display   IMPRESSION / MDM / [place] / ED COURSE  I reviewed the triage vital signs and the nursing notes.  Patient's presentation is most consistent with acute presentation with potential threat to life or bodily function.  Patient presents emergency department for worsening shortness of breath and cough as well as lower extremity him over the past 3 weeks.  Patient states her doctor took her off of Lasix approximately 3 months ago due to worsening renal function.  Patient's symptoms are most concerning for CHF exacerbation now with worsening peripheral edema and orthopnea.  Patient satting between 92 and 96% on room air.  Patient's BNP is elevated 2500 in the emergency department troponin of 17, largely unchanged from 20 taken earlier today.  CBC  is normal.  Chest x-ray is read as CHF with cardiomegaly however on my evaluation patient appears to have pulmonary edema.  We will start on IV Lasix.  Patient will likely require admission to the hospital for diuresis.  Patient agreeable to plan of care.  Patient's pulse ox dropped to 88% on room air with minimal movement in the bed.  Placed on 2 L nasal cannula now satting in the mid 90s.  We will admit to the hospital service for further work-up.  Remainder the patient's work-up is reassuring.  Chemistry is resulted showing no renal dysfunction.  Normal potassium.  FINAL CLINICAL IMPRESSION(S) / ED DIAGNOSES   CHF exacerbation Dyspnea Orthopnea   Note:  This document was prepared using Dragon voice recognition software and may include unintentional dictation errors.    Harvest Dark, MD 05/07/22 Raye Sorrow    Harvest Dark, MD 05/07/22 2016

## 2022-05-07 NOTE — H&P (Signed)
History and Physical    Nicy Solo KGM:[id number] DOB: 17-Jun-1951 DOA: 05/07/2022  PCP: Glori Luis, MD  Patient coming from: home  I have personally briefly reviewed patient's old medical records in [facility]  Chief Complaint: sob 2-3  but worse over the last one week   HPI: Melanie Ray is a 71 y.o. female with medical history significant of history of CAD s/p MI s/pstents to LAD, CHFref ef 30-35%, DMII diet controlled s/p losing weight, HTN, HLD, LBBB, OSA  noncompliant with cpap, CVA no residual weakness, s/p Pacemaker placement 2/23,  recent history of acute renal insufficiency 3 mo ago at which time patient was taken of lasix.  Patient now present with 2-3 weeks sob and lower extremity swelling. Patient notes symptoms worse over the last 1 week leading to difficulty with ADLS.  Patient notes today had follow with  pcp and she was referred to ED with concern for chf exacerbation. Patient notes  no associated chest/n/v/abdominal/diarrhea/ no HA, but notes presyncope, as well as generalized weakness. She notes she attempts to watch her salt intake but does eat out multiple times per week. She notes she does not limit her fluid intake.   ED Course:  Vitals: afeb, bp 152/75, hr 78, rr 24, sat 96% 2L Ce 20,17 Wbc 9.4, hgb 13, plt 325 BMP 142, K 4.3 , Cl 112, cr 1.1 at baseline bNP 2597/prior >4500 EKG: sinus rhythm , pvc LBBB Ct chest  tx lasix 60 mg IMPRESSION: Bilateral pleural effusions with basilar atelectatic changes.   Cardiomegaly.     Review of Systems: As per HPI otherwise 10 point review of systems negative.   Past Medical History:  Diagnosis Date   Anemia    Anxiety    Back pain    CAD (coronary artery disease) 04/11/2016   S/p ant STEMI 5/17: LHC >> LAD proximal 80%, mid 80%, distal 50%, ostial D1 60%; LCx with LPDA lesion 30%; RCA Mild calcification with no significant stenosis in a medium caliber, nondominant RCA; LVEF is estimated  at 45% with inferoapical and lateral wall akinesis >> PCI: PCI: 3.5 x 24 mm Promus DES to prox LAD, 2.5 x 12 mm Promus DES to mid LAD. // Myoview 7/21: EF 20, no ischemia; high risk    Chest pain    Chronic systolic CHF (congestive heart failure) (HCC) 03/21/2016   Echo 01/30/17: Diff HK, mild focal basal septal hypertrophy, EF 30-35, mild AI, MAC, mild MR // Echo 06/08/16: Mild focal basal septal hypertrophy, EF 25-30%, diff HK, ant-septal AK, Gr 1 DD, mild AI, MAC, mild MR, PASP 37 mmHg // Echo 03/18/16: EF 30-35%, ant-septal AK, Gr 1 DD, mild MR, severe LAE.   Constipation    Depression    Diabetes mellitus type 2 in obese (HCC)    Dizziness    Glaucoma    Gout    Heart disease    Heartburn    History of acute anterior wall MI 03/17/2016   History of heart attack    Hyperlipemia    Hypertension    Ischemic cardiomyopathy 10/02/2016   Refused ICD   Joint pain    Left bundle branch block    Myocardial infarction (HCC)    Nausea    Positive colorectal cancer screening using Cologuard test 03/01/2020   Sleep apnea    SOB (shortness of breath)    Suicidal ideation 08/27/2018   Swelling    feet or legs    Past Surgical  History:  Procedure Laterality Date   APPENDECTOMY     BIV ICD INSERTION CRT-D N/A 01/02/2022   Procedure: BIV ICD INSERTION CRT-D;  Surgeon: Lanier Prude, MD;  Location: [facility] INVASIVE CV LAB;  Service: Cardiovascular;  Laterality: N/A;   CARDIAC CATHETERIZATION N/A 03/17/2016   Procedure: Left Heart Cath and Coronary Angiography;  Surgeon: Tonny Bollman, MD;  Location: [facility] INVASIVE CV LAB;  Service: Cardiovascular;  Laterality: N/A;   CARDIAC CATHETERIZATION N/A 03/17/2016   Procedure: Coronary Stent Intervention;  Surgeon: Tonny Bollman, MD;  Location: [facility] INVASIVE CV LAB;  Service: Cardiovascular;  Laterality: N/A;   COLONOSCOPY WITH PROPOFOL N/A 08/13/2017   Procedure: COLONOSCOPY WITH PROPOFOL;  Surgeon: Wyline Mood, MD;  Location: [facility] ENDOSCOPY;  Service:  Gastroenterology;  Laterality: N/A;   COLONOSCOPY WITH PROPOFOL N/A 03/29/2020   Procedure: COLONOSCOPY WITH PROPOFOL;  Surgeon: Midge Minium, MD;  Location: [facility] ENDOSCOPY;  Service: Endoscopy;  Laterality: N/A;   SMALL BOWEL REPAIR     TONSILLECTOMY     UTERINE FIBROID SURGERY       reports that she quit smoking about 25 years ago. Her smoking use included cigarettes. She has never used smokeless tobacco. She reports that she does not currently use alcohol. She reports that she does not use drugs.  Allergies  Allergen Reactions   Tetracycline Swelling   Meperidine Nausea And Vomiting    Nausea   Entresto [Sacubitril-Valsartan] Other (See Comments)    Shortness of Breath    Family History  Problem Relation Age of Onset   Anxiety disorder Mother    Paranoid behavior Mother    Hypertension Mother    Dementia Mother    High Cholesterol Mother    Diabetes Mother    Hyperlipidemia Mother    Depression Mother    Hypertension Father    High Cholesterol Father    Mood Disorder Sister    Stroke Sister    Anxiety disorder Maternal Aunt    Tuberculosis Paternal Grandfather    Drug abuse Cousin     Prior to Admission medications   Medication Sig Start Date End Date Taking? Authorizing Provider  atorvastatin (LIPITOR) 80 MG tablet Take 1 tablet by mouth once daily 11/20/21  Yes Bensimhon, Bevelyn Buckles, MD  carvedilol (COREG) 12.5 MG tablet TAKE 1 TABLET BY MOUTH TWICE DAILY WITH MEALS 04/18/22  Yes Tonny Bollman, MD  clopidogrel (PLAVIX) 75 MG tablet Take 1 tablet by mouth once daily 04/30/22  Yes Glori Luis, MD  dorzolamide-timolol (COSOPT) 22.3-6.8 MG/ML ophthalmic solution Place 1 drop into both eyes 2 (two) times daily.  01/17/15  Yes [provider]  furosemide (LASIX) 20 MG tablet Take 1 tablet (20 mg total) by mouth daily. 05/07/22  Yes Glori Luis, MD  isosorbide mononitrate (IMDUR) 30 MG 24 hr tablet Take 1 tablet (30 mg total) by mouth daily. 02/27/21  Yes  Tonny Bollman, MD  latanoprost (XALATAN) 0.005 % ophthalmic solution Place 1 drop into both eyes at bedtime. 03/15/20  Yes [provider]  lurasidone (LATUDA) 40 MG TABS tablet Take 40 mg by mouth daily with breakfast.   Yes [provider]  traZODone (DESYREL) 150 MG tablet Take 150 mg by mouth at bedtime. 10/23/21  Yes [provider]  valsartan (DIOVAN) 80 MG tablet Take 80 mg by mouth daily.   Yes [provider]  venlafaxine XR (EFFEXOR-XR) 150 MG 24 hr capsule TAKE 2 CAPSULES BY MOUTH ONCE DAILY (NEEDS to follow-up with psychiatry for further  refills) 11/02/20  Yes Glori Luis, MD  LORazepam (ATIVAN) 0.5 MG tablet Take 0.5 mg by mouth daily as needed for anxiety. 10/23/21   [provider]  nitroGLYCERIN (NITROSTAT) 0.4 MG SL tablet DISSOLVE ONE TABLET UNDER THE TONGUE EVERY FIVE MINUTES AS NEEDED FOR CHEST PAIN. DO NOT EXCEED A TOTAL OF THREE DOSES IN 15 MINUTES 12/11/21   Tonny Bollman, MD    Physical Exam: Vitals:   05/07/22 1900 05/07/22 2000 05/07/22 2051 05/07/22 2100  BP: (!) 156/70 (!) 170/82  (!) 162/88  Pulse: 79 96  78  Resp: (!) 26 (!) 36  (!) 23  Temp:      TempSrc:      SpO2: 96% 95% 100% 100%  Weight:      Height:         Vitals:   05/07/22 1900 05/07/22 2000 05/07/22 2051 05/07/22 2100  BP: (!) 156/70 (!) 170/82  (!) 162/88  Pulse: 79 96  78  Resp: (!) 26 (!) 36  (!) 23  Temp:      TempSrc:      SpO2: 96% 95% 100% 100%  Weight:      Height:      Constitutional: NAD, calm, comfortable Eyes: PERRL, lids and conjunctivae normal ENMT: Mucous membranes are moist. Posterior pharynx clear of any exudate or lesions.Normal dentition.  Neck: normal, supple, no masses, no thyromegaly Respiratory: clear to auscultation bilaterally, no wheezing, no crackles. Diminished at bases Normal respiratory effort. No accessory muscle use.  Cardiovascular: Regular rate and rhythm, no murmurs / rubs / gallops. +1edema. 2+ pedal  pulses.  Abdomen: no tenderness, no masses palpated. No hepatosplenomegaly. Bowel sounds positive.  Musculoskeletal: no clubbing / cyanosis. No joint deformity upper and lower extremities. Good ROM, no contractures. Normal muscle tone.  Skin: no rashes, lesions, ulcers. No induration Neurologic: CN 2-12 grossly intact. Sensation intact, Strength 5/5 in all 4.  Psychiatric: Normal judgment and insight. Alert and oriented x 3. Normal mood.    Labs on Admission: I have personally reviewed following labs and imaging studies  CBC: Recent Labs  Lab 05/07/22 1610 05/07/22 1843  WBC 9.4 8.0  HGB 13.0 12.4  HCT 41.5 39.1  MCV 83.5 84.3  PLT 325 263   Basic Metabolic Panel: Recent Labs  Lab 05/07/22 1610 05/07/22 1939  NA 142 142  K 4.3 4.0  CL 112* 115*  CO2 23 21*  GLUCOSE 149* 103*  BUN 23 22  CREATININE 1.10* 0.88  CALCIUM 9.4 8.8*   GFR: Estimated Creatinine Clearance: 47.8 mL/min (by C-G formula based on SCr of 0.88 mg/dL). Liver Function Tests: No results for input(s): "AST", "ALT", "ALKPHOS", "BILITOT", "PROT", "ALBUMIN" in the last 168 hours. No results for input(s): "LIPASE", "AMYLASE" in the last 168 hours. No results for input(s): "AMMONIA" in the last 168 hours. Coagulation Profile: No results for input(s): "INR", "PROTIME" in the last 168 hours. Cardiac Enzymes: No results for input(s): "CKTOTAL", "CKMB", "CKMBINDEX", "TROPONINI" in the last 168 hours. BNP (last 3 results) Recent Labs    09/05/21 1205 12/19/21 1107  PROBNP 1,262.0* 2,342.0*   HbA1C: No results for input(s): "HGBA1C" in the last 72 hours. CBG: No results for input(s): "GLUCAP" in the last 168 hours. Lipid Profile: No results for input(s): "CHOL", "HDL", "LDLCALC", "TRIG", "CHOLHDL", "LDLDIRECT" in the last 72 hours. Thyroid Function Tests: No results for input(s): "TSH", "T4TOTAL", "FREET4", "T3FREE", "THYROIDAB" in the last 72 hours. Anemia Panel: No results for input(s): "VITAMINB12",  "FOLATE", "FERRITIN", "  TIBC", "IRON", "RETICCTPCT" in the last 72 hours. Urine analysis:    Component Value Date/Time   COLORURINE YELLOW (A) 11/22/2016 1138   APPEARANCEUR CLEAR (A) 11/22/2016 1138   LABSPEC 1.019 11/22/2016 1138   PHURINE 5.0 11/22/2016 1138   GLUCOSEU NEGATIVE 11/22/2016 1138   HGBUR NEGATIVE 11/22/2016 1138   BILIRUBINUR NEGATIVE 11/22/2016 1138   KETONESUR NEGATIVE 11/22/2016 1138   PROTEINUR NEGATIVE 11/22/2016 1138   NITRITE NEGATIVE 11/22/2016 1138   LEUKOCYTESUR NEGATIVE 11/22/2016 1138    Radiological Exams on Admission: DG Chest 1 View  Result Date: 05/07/2022 CLINICAL DATA:  Shortness of breath EXAM: CHEST  1 VIEW COMPARISON:  01/03/2022 FINDINGS: Mild cardiomegaly with bibasilar atelectasis. No focal airspace consolidation. No pleural effusion. Unchanged position of AICD leads. IMPRESSION: Mild cardiomegaly and bibasilar atelectasis. Electronically Signed   By: Deatra Robinson M.D.   On: 05/07/2022 19:31    EKG: Independently reviewed. See above  Assessment/Plan  Acute on chronic CHFref with acute hypoxic respiratory failure -b/l Pleural effusion -admit to tele -place on lasix iv bid  -daily wt  -fluid restriction - heart failure education -wean O2 -echo in am   CAD s/p MI s/p stent -no active issues  -resume home regimen  -coreg, lipitor, diovan, plavix   DMII -diet controlled -place on poc glucose   HTN  -stable  -resume home regimen   HLD -continue station   OSA -does not use cpap  Qhs O2   CVA -no residual deficits  -continue secondary ppx    DVT prophylaxis: heparin Code Status: full Family Communication: none at bedside Disposition Plan: patient  expected to be admitted greater than 2 midnights  Consults called: Dr Gala Romney Admission status:cardiac tele   Lurline Del MD [facility]   If 7PM-7AM, please contact night-coverage [email or web address] Password [facility]  05/07/2022, 9:47 PM

## 2022-05-07 NOTE — Assessment & Plan Note (Addendum)
Patient has increased anxiety recently.  Discussed that she needs to contact her psychiatrist to discuss appropriate treatment options for this as I am not sure what I would treat her acute anxiety with.  I would not recommend any benzodiazepines given her age and her current breathing status.  Hydroxyzine is not an option given her history of QT prolongation and cardiac issues.  Discussed that her psychiatrist should be able to offer some guidance on what to do for her anxiety.

## 2022-05-07 NOTE — Assessment & Plan Note (Addendum)
I am concerned that her current symptoms are related to fluid accumulation from her CHF.  EKG as outlined.  It is difficult to tell if these are acute changes or if they occurred 3 weeks ago when her symptoms started.  Given the changes in leads V4 and V5 in combination with her symptoms I did advise emergency room evaluation though the patient is concerned about her husband with dementia and her dog and preferred to have an outpatient work-up first.  Discussed the risk of an adverse event happening to her where there would not be immediate assistance.  [facility] [place] [facility] to have lab work drawn so that we get this back as quickly as possible.  We will start her on Lasix 20 mg daily to help with fluid accumulation.  She will follow-up with me later this week.  She will go to the emergency department if she develops worsening shortness of breath, chest pain, or any new or changing symptoms.

## 2022-05-07 NOTE — ED Notes (Signed)
Pt not Sob at this time. Is 96% on RA,. States was severely SOB today. Has not started lasix yet for new dx CHF.

## 2022-05-07 NOTE — ED Notes (Signed)
Pt denies pain and SHOB at this time.  Resting in bed, A&Ox4 at this time.

## 2022-05-07 NOTE — ED Notes (Signed)
Pt had an episode of SHOB , sats went from 96 to 88.  Pt took deep breaths and self resolved back up to 95.  As I walked back into room to give lasix, pt was visibly diaphoretic on face only. Stated it happened as feeling SHOB.  Still states SHOB but sats at 96.

## 2022-05-07 NOTE — ED Triage Notes (Signed)
Pt to ED [facility] from home Pt saw PCp for new onset CHF, started on lasix, has not taken yet Was called by PCP office, abnormal labs including troponin Not complaining of CP, is complaining of SOB, worse with [facility] EMS has taken 6 EKGs including R EKGs and posterior EKGs Pt has 20g to L forearm after 4 tries by EMS   12 leadssent to EDP to examine Has pacemaker MI in 2017, stents placed  159/72 199 CBG HR 70-95  Pt endorses SOB and weakness, denies CP and dizziness

## 2022-05-07 NOTE — Progress Notes (Signed)
Dizziness shortness of breath for 3 weeks

## 2022-05-08 ENCOUNTER — Inpatient Hospital Stay (HOSPITAL_COMMUNITY)
Admit: 2022-05-08 | Discharge: 2022-05-08 | Disposition: A | Discharge status: Home / Self Care | Payer: Medicare Other | Attending: Internal Medicine | Admitting: Internal Medicine

## 2022-05-08 DIAGNOSIS — I25118 Atherosclerotic heart disease of native coronary artery with other forms of angina pectoris: Secondary | ICD-10-CM | POA: Diagnosis not present

## 2022-05-08 DIAGNOSIS — I5021 Acute systolic (congestive) heart failure: Secondary | ICD-10-CM

## 2022-05-08 DIAGNOSIS — I5023 Acute on chronic systolic (congestive) heart failure: Secondary | ICD-10-CM | POA: Diagnosis not present

## 2022-05-08 DIAGNOSIS — I10 Essential (primary) hypertension: Secondary | ICD-10-CM

## 2022-05-08 DIAGNOSIS — I5043 Acute on chronic combined systolic (congestive) and diastolic (congestive) heart failure: Secondary | ICD-10-CM

## 2022-05-08 DIAGNOSIS — I340 Nonrheumatic mitral (valve) insufficiency: Secondary | ICD-10-CM

## 2022-05-08 DIAGNOSIS — E782 Mixed hyperlipidemia: Secondary | ICD-10-CM

## 2022-05-08 DIAGNOSIS — Z91148 Patient's other noncompliance with medication regimen for other reason: Secondary | ICD-10-CM

## 2022-05-08 LAB — CBG MONITORING, ED: Glucose-Capillary: 150 mg/dL — ABNORMAL HIGH (ref 70–99)

## 2022-05-08 LAB — ECHOCARDIOGRAM COMPLETE
AR max vel: 0.91 cm2
AV Peak grad: 9.5 mmHg
Ao pk vel: 1.54 m/s
Area-P 1/2: 2.67 cm2
Calc EF: 26.4 %
Height: 63 in
P 1/2 time: 466 msec
S' Lateral: 5.2 cm
Single Plane A2C EF: 20.9 %
Single Plane A4C EF: 32.8 %
Weight: 2160 oz

## 2022-05-08 LAB — GLUCOSE, CAPILLARY: Glucose-Capillary: 102 mg/dL — ABNORMAL HIGH (ref 70–99)

## 2022-05-08 LAB — HEMOGLOBIN A1C
Hgb A1c MFr Bld: 5.5 % (ref 4.8–5.6)
Mean Plasma Glucose: 111.15 mg/dL

## 2022-05-08 MED ORDER — ATORVASTATIN CALCIUM 80 MG PO TABS
80.0000 mg | ORAL_TABLET | Freq: Every day | ORAL | Status: DC
  Administered 2022-05-09: 80 mg via ORAL
  Filled 2022-05-08: qty 1
  Filled 2022-05-08: qty 4

## 2022-05-08 MED ORDER — ACETAMINOPHEN 325 MG PO TABS: 650.0000 mg | ORAL_TABLET | ORAL | Status: DC | PRN

## 2022-05-08 MED ORDER — SODIUM CHLORIDE 0.9 % IV SOLN
250.0000 mL | INTRAVENOUS | Status: DC | PRN
Start: 2022-05-08 — End: 2022-05-09

## 2022-05-08 MED ORDER — SODIUM CHLORIDE 0.9% FLUSH
3.0000 mL | Freq: Two times a day (BID) | INTRAVENOUS | Status: DC
  Administered 2022-05-08 – 2022-05-09 (×3): 3 mL via INTRAVENOUS

## 2022-05-08 MED ORDER — IRBESARTAN 150 MG PO TABS
75.0000 mg | ORAL_TABLET | Freq: Every day | ORAL | Status: DC
  Administered 2022-05-09: 75 mg via ORAL
  Filled 2022-05-08 (×2): qty 1

## 2022-05-08 MED ORDER — CLOPIDOGREL BISULFATE 75 MG PO TABS
75.0000 mg | ORAL_TABLET | Freq: Every day | ORAL | Status: DC
  Administered 2022-05-09: 75 mg via ORAL
  Filled 2022-05-08 (×2): qty 1

## 2022-05-08 MED ORDER — INSULIN ASPART 100 UNIT/ML IJ SOLN
0.0000 [IU] | Freq: Every day | INTRAMUSCULAR | Status: DC
  Filled 2022-05-08: qty 1

## 2022-05-08 MED ORDER — FUROSEMIDE 10 MG/ML IJ SOLN
40.0000 mg | Freq: Two times a day (BID) | INTRAMUSCULAR | Status: DC
  Administered 2022-05-08 – 2022-05-09 (×3): 40 mg via INTRAVENOUS
  Filled 2022-05-08 (×3): qty 4

## 2022-05-08 MED ORDER — CARVEDILOL 12.5 MG PO TABS
12.5000 mg | ORAL_TABLET | Freq: Two times a day (BID) | ORAL | Status: DC
  Administered 2022-05-08 – 2022-05-09 (×2): 12.5 mg via ORAL
  Filled 2022-05-08: qty 2
  Filled 2022-05-08: qty 1
  Filled 2022-05-08: qty 2

## 2022-05-08 MED ORDER — HEPARIN SODIUM (PORCINE) 5000 UNIT/ML IJ SOLN
5000.0000 [IU] | Freq: Three times a day (TID) | INTRAMUSCULAR | Status: DC
  Administered 2022-05-08 – 2022-05-09 (×6): 5000 [IU] via SUBCUTANEOUS
  Filled 2022-05-08 (×6): qty 1

## 2022-05-08 MED ORDER — INSULIN ASPART 100 UNIT/ML IJ SOLN: 0.0000 [IU] | Freq: Three times a day (TID) | INTRAMUSCULAR | Status: DC

## 2022-05-08 MED ORDER — SODIUM CHLORIDE 0.9% FLUSH: 3.0000 mL | INTRAVENOUS | Status: DC | PRN

## 2022-05-08 MED ORDER — FUROSEMIDE 10 MG/ML IJ SOLN: 40.0000 mg | Freq: Two times a day (BID) | INTRAMUSCULAR | Status: DC

## 2022-05-08 NOTE — Progress Notes (Signed)
*  PRELIMINARY RESULTS* Echocardiogram 2D Echocardiogram has been performed.  Claretta Fraise 05/08/2022, 10:46 AM

## 2022-05-08 NOTE — Consult Note (Addendum)
   Heart Failure Nurse Navigator Note  HFrEF 25 to 30%.  Global hypokinesis.  Left ventricular internal cavity is dilated.  2 diastolic dysfunction.  Abnormal longitudinal strain.  Mild aortic insufficiency.  Mild to moderate mitral regurgitation.  Previously by MRI ejection fraction reported at 17%.  Echocardiogram performed on this admission is pending results.  She presented to the emergency room with a 2 to 3-week  history of shortness of breath lower extremity edema and orthopnea.  States that she was taken off her diuretic approximately 2 to 3 months ago.  Chest CT revealed bilateral pleural effusion.  BNP elevated at greater than 4500.  Comorbidities:  Coronary artery disease with stenting Type 2 diabetes Hypertension Hyperlipidemia Left bundle branch block Obstructive sleep apnea, not on CPAP CVA CRT-D Anemia Depression/anxiety Excessive compulsive disorder  Medications:  Carvedilol 12.5 mg twice a day Plavix 75 mg daily Furosemide 40 mg IV twice daily Irbesartan 75 mg daily  Labs:  Sodium 142, potassium 4.3, chloride 112, CO2 23, BUN 23, creatinine 1.10, GFR 53, globin A1c 5.5, hemoglobin 13, hematocrit 41.5, platelet count 325. Weight is 61.2 kg Blood pressure 151/84   Initial meeting with patient in the emergency room.  She is lying on a gurney in no acute distress.  She states that she is followed by Dr. Haroldine Laws the advanced heart failure clinic.  She states that approximately 2 to 3 months ago she was taken off her diuretic due to worsening kidney function.  She states over the last 2 to 3 weeks that she had seen a gradual weight gain along with increasing lower extremity edema and shortness of breath.  She states that she does weigh herself daily.  Discussed daily weights and what to report along with changes in symptoms.  She states that she really does not stick with the fluid restriction. Discussed restricting to  64 ounces daily. She states that she does  use some salt.  She also admits to eating at restaurants at least 3 times a week.  Was given the living with heart failure teaching booklet and went over making wise choices when eating at restaurants.  She has an appointment with Dr. Haroldine Laws on September 12.  We will check with his clinic to see if the patient needs to be seen sooner since she has been hospitalized.  She had no further questions at this time.  Pricilla Riffle RN [facility]

## 2022-05-08 NOTE — ED Notes (Signed)
Pt requesting to take her own meds that she brought from home, provider advised that was fine to have pharmacy verify pt meds first. Pharmacy notified.

## 2022-05-08 NOTE — H&P (Incomplete)
History and Physical    Melanie Ray QJF:[id number] DOB: 05/23/1951 DOA: 05/07/2022  PCP: Leone Haven, MD  Patient coming from: home  I have personally briefly reviewed patient's old medical records in [place]  Chief Complaint: sob 2-3  but worse over the last one week   HPI: Melanie Ray is a 71 y.o. female with medical history significant of history of CAD s/p MI s/pstents to LAD, CHFref ef 30-35%, DMII diet controlled s/p losing weight, HTN, HLD, LBBB, OSA  noncompliant with cpap, CVA no residual weakness, s/p Pacemaker placement 2/23,  recent history of acute renal insufficiency 3 mo ago at which time patient was taken of lasix.  Patient now present with 2-3 weeks sob and lower extremity swelling. Patient notes symptoms worse over the last 1 week leading to difficulty with ADLS.  Patient notes today had follow with  pcp and she was referred to ED with concern for chf exacerbation. Patient notes  no associated chest/n/v/abdominal/diarrhea/ no HA, but notes presyncope, as well as generalized weakness. She notes she attempts to watch her salt intake but does eat out multiple times per week. She notes she does not limit her fluid intake.   ED Course:  Vitals: afeb, bp 152/75, hr 78, rr 24, sat 96% Ce 17 Wbc 9.4, hgb 13, plt 325 BMP 142, K 4.3 , Cl 112, cr 1.1 bNP 2597/prior >4500 Ct chest   IMPRESSION: Bilateral pleural effusions with basilar atelectatic changes.   Cardiomegaly.     Review of Systems: As per HPI otherwise 10 point review of systems negative.   Past Medical History:  Diagnosis Date  . Anemia   . Anxiety   . Back pain   . CAD (coronary artery disease) 04/11/2016   S/p ant STEMI 5/17: LHC >> LAD proximal 80%, mid 80%, distal 50%, ostial D1 60%; LCx with LPDA lesion 30%; RCA Mild calcification with no significant stenosis in a medium caliber, nondominant RCA; LVEF is estimated at 45% with inferoapical and lateral wall akinesis >> PCI:  PCI: 3.5 x 24 mm Promus DES to prox LAD, 2.5 x 12 mm Promus DES to mid LAD. // Myoview 7/21: EF 20, no ischemia; high risk   . Chest pain   . Chronic systolic CHF (congestive heart failure) ([place]) 03/21/2016   Echo 01/30/17: Diff HK, mild focal basal septal hypertrophy, EF 30-35, mild AI, MAC, mild MR // Echo 06/08/16: Mild focal basal septal hypertrophy, EF 25-30%, diff HK, ant-septal AK, Gr 1 DD, mild AI, MAC, mild MR, PASP 37 mmHg // Echo 03/18/16: EF 30-35%, ant-septal AK, Gr 1 DD, mild MR, severe LAE.  [name] Constipation   . Depression   . Diabetes mellitus type 2 in obese ([place])   . Dizziness   . Glaucoma   . Gout   . Heart disease   . Heartburn   . History of acute anterior wall MI 03/17/2016  . History of heart attack   . Hyperlipemia   . Hypertension   . Ischemic cardiomyopathy 10/02/2016   Refused ICD  . Joint pain   . Left bundle branch block   . Myocardial infarction ([place])   . Nausea   . Positive colorectal cancer screening using Cologuard test 03/01/2020  . Sleep apnea   . SOB (shortness of breath)   . Suicidal ideation 08/27/2018  . Swelling    feet or legs    Past Surgical History:  Procedure Laterality Date  . APPENDECTOMY    .  BIV ICD INSERTION CRT-D N/A 01/02/2022   Procedure: BIV ICD INSERTION CRT-D;  Surgeon: Vickie Epley, MD;  Location: [facility];  Service: Cardiovascular;  Laterality: N/A;  . CARDIAC CATHETERIZATION N/A 03/17/2016   Procedure: Left Heart Cath and Coronary Angiography;  Surgeon: Sherren Mocha, MD;  Location: [facility];  Service: Cardiovascular;  Laterality: N/A;  . CARDIAC CATHETERIZATION N/A 03/17/2016   Procedure: Coronary Stent Intervention;  Surgeon: Sherren Mocha, MD;  Location: [facility];  Service: Cardiovascular;  Laterality: N/A;  . COLONOSCOPY WITH PROPOFOL N/A 08/13/2017   Procedure: COLONOSCOPY WITH PROPOFOL;  Surgeon: Jonathon Bellows, MD;  Location[facility]COPY;  Service: Gastroenterology;  Laterality: N/A;   . COLONOSCOPY WITH PROPOFOL N/A 03/29/2020   Procedure: COLONOSCOPY WITH PROPOFOL;  Surgeon: Lucilla Lame, MD;  Location: [facility] ENDOSCOPY;  Service: Endoscopy;  Laterality: N/A;  . SMALL BOWEL REPAIR    . TONSILLECTOMY    . UTERINE FIBROID SURGERY       reports that she quit smoking about 25 years ago. Her smoking use included cigarettes. She has never used smokeless tobacco. She reports that she does not currently use alcohol. She reports that she does not use drugs.  Allergies  Allergen Reactions  . Tetracycline Swelling  . Meperidine Nausea And Vomiting    Nausea  . Entresto [Sacubitril-Valsartan] Other (See Comments)    Shortness of Breath    Family History  Problem Relation Age of Onset  . Anxiety disorder Mother   . Paranoid behavior Mother   . Hypertension Mother   . Dementia Mother   . High Cholesterol Mother   . Diabetes Mother   . Hyperlipidemia Mother   . Depression Mother   . Hypertension Father   . High Cholesterol Father   . Mood Disorder Sister   . Stroke Sister   . Anxiety disorder Maternal Aunt   . Tuberculosis Paternal Grandfather   . Drug abuse Cousin    *** Prior to Admission medications   Medication Sig Start Date End Date Taking? Authorizing Provider  atorvastatin (LIPITOR) 80 MG tablet Take 1 tablet by mouth once daily 11/20/21  Yes Bensimhon, Shaune Pascal, MD  carvedilol (COREG) 12.5 MG tablet TAKE 1 TABLET BY MOUTH TWICE DAILY WITH MEALS 04/18/22  Yes Sherren Mocha, MD  clopidogrel (PLAVIX) 75 MG tablet Take 1 tablet by mouth once daily 04/30/22  Yes Leone Haven, MD  dorzolamide-timolol (COSOPT) 22.3-6.8 MG/ML ophthalmic solution Place 1 drop into both eyes 2 (two) times daily.  01/17/15  Yes [provider]  furosemide (LASIX) 20 MG tablet Take 1 tablet (20 mg total) by mouth daily. 05/07/22  Yes Leone Haven, MD  isosorbide mononitrate (IMDUR) 30 MG 24 hr tablet Take 1 tablet (30 mg total) by mouth daily. 02/27/21  Yes Sherren Mocha, MD  latanoprost (XALATAN) 0.005 % ophthalmic solution Place 1 drop into both eyes at bedtime. 03/15/20  Yes [provider]  lurasidone (LATUDA) 40 MG TABS tablet Take 40 mg by mouth daily with breakfast.   Yes [provider]  traZODone (DESYREL) 150 MG tablet Take 150 mg by mouth at bedtime. 10/23/21  Yes [provider]  valsartan (DIOVAN) 80 MG tablet Take 80 mg by mouth daily.   Yes [provider]  venlafaxine XR (EFFEXOR-XR) 150 MG 24 hr capsule TAKE 2 CAPSULES BY MOUTH ONCE DAILY (NEEDS to follow-up with psychiatry for further refills) 11/02/20  Yes Leone Haven, MD  LORazepam (ATIVAN) 0.5  MG tablet Take 0.5 mg by mouth daily as needed for anxiety. 10/23/21   [provider]  nitroGLYCERIN (NITROSTAT) 0.4 MG SL tablet DISSOLVE ONE TABLET UNDER THE TONGUE EVERY FIVE MINUTES AS NEEDED FOR CHEST PAIN. DO NOT EXCEED A TOTAL OF THREE DOSES IN 15 MINUTES 12/11/21   Sherren Mocha, MD    Physical Exam: Vitals:   05/07/22 1900 05/07/22 2000 05/07/22 2051 05/07/22 2100  BP: (!) 156/70 (!) 170/82  (!) 162/88  Pulse: 79 96  78  Resp: (!) 26 (!) 36  (!) 23  Temp:      TempSrc:      SpO2: 96% 95% 100% 100%  Weight:      Height:        Constitutional: NAD, calm, comfortable Vitals:   05/07/22 1900 05/07/22 2000 05/07/22 2051 05/07/22 2100  BP: (!) 156/70 (!) 170/82  (!) 162/88  Pulse: 79 96  78  Resp: (!) 26 (!) 36  (!) 23  Temp:      TempSrc:      SpO2: 96% 95% 100% 100%  Weight:      Height:       Eyes: PERRL, lids and conjunctivae normal ENMT: Mucous membranes are moist. Posterior pharynx clear of any exudate or lesions.Normal dentition.  Neck: normal, supple, no masses, no thyromegaly Respiratory: clear to auscultation bilaterally, no wheezing, no crackles. Normal respiratory effort. No accessory muscle use.  Cardiovascular: Regular rate and rhythm, no murmurs / rubs / gallops. No extremity edema. 2+ pedal pulses. No  carotid bruits.  Abdomen: no tenderness, no masses palpated. No hepatosplenomegaly. Bowel sounds positive.  Musculoskeletal: no clubbing / cyanosis. No joint deformity upper and lower extremities. Good ROM, no contractures. Normal muscle tone.  Skin: no rashes, lesions, ulcers. No induration Neurologic: CN 2-12 grossly intact. Sensation intact, DTR normal. Strength 5/5 in all 4.  Psychiatric: Normal judgment and insight. Alert and oriented x 3. Normal mood.    Labs on Admission: I have personally reviewed following labs and imaging studies  CBC: Recent Labs  Lab 05/07/22 1610 05/07/22 1843  WBC 9.4 8.0  HGB 13.0 12.4  HCT 41.5 39.1  MCV 83.5 84.3  PLT 325 696   Basic Metabolic Panel: Recent Labs  Lab 05/07/22 1610 05/07/22 1939  NA 142 142  K 4.3 4.0  CL 112* 115*  CO2 23 21*  GLUCOSE 149* 103*  BUN 23 22  CREATININE 1.10* 0.88  CALCIUM 9.4 8.8*   GFR: Estimated Creatinine Clearance: 47.8 mL/min (by C-G formula based on SCr of 0.88 mg/dL). Liver Function Tests: No results for input(s): "AST", "ALT", "ALKPHOS", "BILITOT", "PROT", "ALBUMIN" in the last 168 hours. No results for input(s): "LIPASE", "AMYLASE" in the last 168 hours. No results for input(s): "AMMONIA" in the last 168 hours. Coagulation Profile: No results for input(s): "INR", "PROTIME" in the last 168 hours. Cardiac Enzymes: No results for input(s): "CKTOTAL", "CKMB", "CKMBINDEX", "TROPONINI" in the last 168 hours. BNP (last 3 results) Recent Labs    09/05/21 1205 12/19/21 1107  PROBNP 1,262.0* 2,342.0*   HbA1C: No results for input(s): "HGBA1C" in the last 72 hours. CBG: No results for input(s): "GLUCAP" in the last 168 hours. Lipid Profile: No results for input(s): "CHOL", "HDL", "LDLCALC", "TRIG", "CHOLHDL", "LDLDIRECT" in the last 72 hours. Thyroid Function Tests: No results for input(s): "TSH", "T4TOTAL", "FREET4", "T3FREE", "THYROIDAB" in the last 72 hours. Anemia Panel: No results for  input(s): "VITAMINB12", "FOLATE", "FERRITIN", "TIBC", "IRON", "RETICCTPCT" in the last 72 hours.  Urine analysis:    Component Value Date/Time   COLORURINE YELLOW (A) 11/22/2016 1138   APPEARANCEUR CLEAR (A) 11/22/2016 1138   LABSPEC 1.019 11/22/2016 1138   PHURINE 5.0 11/22/2016 1138   GLUCOSEU NEGATIVE 11/22/2016 1138   HGBUR NEGATIVE 11/22/2016 1138   [place] 11/22/2016 1138   [place] 11/22/2016 1138   PROTEINUR NEGATIVE 11/22/2016 1138   NITRITE NEGATIVE 11/22/2016 1138   LEUKOCYTESUR NEGATIVE 11/22/2016 1138    Radiological Exams on Admission: DG Chest 1 View  Result Date: 05/07/2022 CLINICAL DATA:  Shortness of breath EXAM: CHEST  1 VIEW COMPARISON:  01/03/2022 FINDINGS: Mild cardiomegaly with bibasilar atelectasis. No focal airspace consolidation. No pleural effusion. Unchanged position of AICD leads. IMPRESSION: Mild cardiomegaly and bibasilar atelectasis. Electronically Signed   By: Ulyses Jarred M.D.   On: 05/07/2022 19:31    EKG: Independently reviewed. ***  Assessment/Plan Principal Problem:   CHF exacerbation (HCC)   ***  DVT prophylaxis: *** (Lovenox/Heparin/SCD's/anticoagulated/None (if comfort care) Code Status: *** (Full/Partial (specify details) Family Communication: *** (Specify name, relationship. Do not write "discussed with patient". Specify tel # if discussed over the phone) Disposition Plan: *** (specify when and where you expect patient to be discharged) Consults called: *** (with names) Admission status: *** (inpatient / obs / tele / medical floor / SDU)   Clance Boll MD [facility] Pager [phone]  If 7PM-7AM, please contact night-coverage [email or web address] Password [facility]  05/07/2022, 9:47 PM

## 2022-05-08 NOTE — Progress Notes (Signed)
PROGRESS NOTE    Melanie Ray  VPX:[id number] DOB: January 29, 1951 DOA: 05/07/2022 PCP: Leone Haven, MD    Brief Narrative:   71 y.o. female with medical history significant of history of CAD s/p MI s/pstents to LAD, CHFref ef 30-35%, DMII diet controlled s/p losing weight, HTN, HLD, LBBB, OSA  noncompliant with cpap, CVA no residual weakness, s/p Pacemaker placement 2/23,  recent history of acute renal insufficiency 3 mo ago at which time patient was taken of lasix.  Patient now present with 2-3 weeks sob and lower extremity swelling. Patient notes symptoms worse over the last 1 week leading to difficulty with ADLS.  Patient notes today had follow with  pcp and she was referred to ED with concern for chf exacerbation. Patient notes  no associated chest/n/v/abdominal/diarrhea/ no HA, but notes presyncope, as well as generalized weakness. She notes she attempts to watch her salt intake but does eat out multiple times per week. She notes she does not limit her fluid intake.  EF noted to be 25 to 30%.  Suspect dietary and medication indiscretion.  Patient was apparently told to stop her Lasix due to worsening renal function.  Eats out frequently including [employer] and [employer].  Assessment & Plan:   Principal Problem:   Acute on chronic systolic CHF (congestive heart failure) (HCC) Active Problems:   Essential hypertension   HLD (hyperlipidemia)   Anxiety   DM type 2 (diabetes mellitus, type 2) (HCC)  Acute on chronic heart failure with reduced ejection fraction Acute hypoxic respiratory failure secondary to above Bilateral pleural effusion Patient was recently instructed to stop taking her Lasix by her primary care As such suspect exacerbation of heart failure due to medication and dietary indiscretions Plan: Continue Lasix 40 mg IV twice daily Strict ins and outs Daily weights Wean O2 as tolerated Fluid restrict  Type 2 diabetes mellitus without complication Patient endorses  a eating out a lot Hemoglobin A1c 5.5, nondiabetic range Plan: Heart healthy diet Hold home metformin Sliding-scale insulin  Coronary disease status post PCI No chest pain Resume Coreg, Lipitor, Diovan, Plavix  Essential hypertension Blood pressure controlled PTA Coreg, valsartan  Moderate mitral regurgitation IV Lasix We will need p.o. diuretics at time of discharge  Hyperlipidemia PTA statin  Obstructive sleep apnea Oxygen as necessary Recommend outpatient evaluation for sleep study.  Patient does not use CPAP at home  History of CVA No residual deficits Antiplatelet therapy as above  DVT prophylaxis: Lovenox Code Status: Full Family Communication: Attempted to call spouse Nadean Corwin [phone] on 7/4.  No answer.  Could not leave voicemail. Disposition Plan: Status is: Inpatient Remains inpatient appropriate because: Decompensated systolic congestive heart failure on IV diuresis.  Suspect will need several days of IV diuresis prior to euvolemia.   Level of care: Telemetry Cardiac  Consultants:  Cardiology-[facility]  Procedures:  None  Antimicrobials: None   Subjective: Seen and examined.  Resting comfortably in bed.  No visible distress.  No pain complaints.  Objective: Vitals:   05/08/22 1100 05/08/22 1130 05/08/22 1200 05/08/22 1230  BP: (!) 149/74 (!) 151/84 (!) 143/82 (!) 146/73  Pulse: 77 79 82 71  Resp: 16 18 (!) 21 18  Temp:      TempSrc:      SpO2: 100% 97% 95% 97%  Weight:      Height:        Intake/Output Summary (Last 24 hours) at 05/08/2022 1339 Last data filed at 05/08/2022 0826 Gross per 24 hour  Intake 3 ml  Output 3200 ml  Net -3197 ml   Filed Weights   05/07/22 1841  Weight: 61.2 kg    Examination:  General exam: NAD.  Appears comfortable Respiratory system: Scattered crackles bilaterally.  Normal work of breathing.  2 L Cardiovascular system: S1-S2, RRR, no murmurs, trace pedal edema Gastrointestinal system: Soft,  NT/ND, normal bowel sounds Central nervous system: Alert and oriented. No focal neurological deficits. Extremities: Symmetric 5 x 5 power. Skin: No rashes, lesions or ulcers Psychiatry: Judgement and insight appear normal. Mood & affect appropriate.     Data Reviewed: I have personally reviewed following labs and imaging studies  CBC: Recent Labs  Lab 05/07/22 1610 05/07/22 1843  WBC 9.4 8.0  HGB 13.0 12.4  HCT 41.5 39.1  MCV 83.5 84.3  PLT 325 366   Basic Metabolic Panel: Recent Labs  Lab 05/07/22 1610 05/07/22 1939  NA 142 142  K 4.3 4.0  CL 112* 115*  CO2 23 21*  GLUCOSE 149* 103*  BUN 23 22  CREATININE 1.10* 0.88  CALCIUM 9.4 8.8*   GFR: Estimated Creatinine Clearance: 47.8 mL/min (by C-G formula based on SCr of 0.88 mg/dL). Liver Function Tests: No results for input(s): "AST", "ALT", "ALKPHOS", "BILITOT", "PROT", "ALBUMIN" in the last 168 hours. No results for input(s): "LIPASE", "AMYLASE" in the last 168 hours. No results for input(s): "AMMONIA" in the last 168 hours. Coagulation Profile: No results for input(s): "INR", "PROTIME" in the last 168 hours. Cardiac Enzymes: No results for input(s): "CKTOTAL", "CKMB", "CKMBINDEX", "TROPONINI" in the last 168 hours. BNP (last 3 results) Recent Labs    09/05/21 1205 12/19/21 1107  PROBNP 1,262.0* 2,342.0*   HbA1C: Recent Labs    05/07/22 1610  HGBA1C 5.5   CBG: No results for input(s): "GLUCAP" in the last 168 hours. Lipid Profile: No results for input(s): "CHOL", "HDL", "LDLCALC", "TRIG", "CHOLHDL", "LDLDIRECT" in the last 72 hours. Thyroid Function Tests: No results for input(s): "TSH", "T4TOTAL", "FREET4", "T3FREE", "THYROIDAB" in the last 72 hours. Anemia Panel: No results for input(s): "VITAMINB12", "FOLATE", "FERRITIN", "TIBC", "IRON", "RETICCTPCT" in the last 72 hours. Sepsis Labs: No results for input(s): "PROCALCITON", "LATICACIDVEN" in the last 168 hours.  No results found for this or any  previous visit (from the past 240 hour(s)).       Radiology Studies: ECHOCARDIOGRAM COMPLETE  Result Date: 05/08/2022    ECHOCARDIOGRAM REPORT   Patient Name:   [facility] Date of Exam: 05/08/2022 Medical Rec #:  [id number]             Height:       63.0 in Accession #:    [id number]            Weight:       135.0 lb Date of Birth:  01-Aug-1951             BSA:          1.636 m Patient Age:    27 years              BP:           147/60 mmHg Patient Gender: F                     HR:           74 bpm. Exam Location:  [facility] Procedure: 2D Echo Indications:     CHF I50.21  History:         Patient  has prior history of Echocardiogram examinations, most                  recent 04/11/2022.  Sonographer:     Kathlen Brunswick RDCS Referring Phys:  [id number] Victoriano Lain A THOMAS Diagnosing Phys: Ida Rogue MD IMPRESSIONS  1. Left ventricular ejection fraction, by estimation, is 20 to 25%. The left ventricle has severely decreased function. The left ventricle demonstrates global hypokinesis. The left ventricular internal cavity size was mildly dilated. Left ventricular diastolic parameters are consistent with Grade I diastolic dysfunction (impaired relaxation).  2. Right ventricular systolic function is normal. The right ventricular size is normal. There is mildly elevated pulmonary artery systolic pressure. The estimated right ventricular systolic pressure is 54.6 mmHg.  3. Left atrial size was moderately dilated.  4. The mitral valve is normal in structure. Moderate mitral valve regurgitation. No evidence of mitral stenosis.  5. The aortic valve was not well visualized. Aortic valve regurgitation is mild. No aortic stenosis is present.  6. The inferior vena cava is normal in size with <50% respiratory variability, suggesting right atrial pressure of 8 mmHg. FINDINGS  Left Ventricle: Left ventricular ejection fraction, by estimation, is 20 to 25%. The left ventricle has severely decreased function. The left  ventricle demonstrates global hypokinesis. The left ventricular internal cavity size was mildly dilated. There is no left ventricular hypertrophy. Left ventricular diastolic parameters are consistent with Grade I diastolic dysfunction (impaired relaxation). Right Ventricle: The right ventricular size is normal. No increase in right ventricular wall thickness. Right ventricular systolic function is normal. There is mildly elevated pulmonary artery systolic pressure. The tricuspid regurgitant velocity is 2.59  m/s, and with an assumed right atrial pressure of 8 mmHg, the estimated right ventricular systolic pressure is 27.0 mmHg. Left Atrium: Left atrial size was moderately dilated. Right Atrium: Right atrial size was normal in size. Pericardium: There is no evidence of pericardial effusion. Mitral Valve: The mitral valve is normal in structure. Moderate mitral valve regurgitation. No evidence of mitral valve stenosis. Tricuspid Valve: The tricuspid valve is normal in structure. Tricuspid valve regurgitation is not demonstrated. No evidence of tricuspid stenosis. Aortic Valve: The aortic valve was not well visualized. Aortic valve regurgitation is mild. Aortic regurgitation PHT measures 466 msec. No aortic stenosis is present. Aortic valve peak gradient measures 9.5 mmHg. Pulmonic Valve: The pulmonic valve was normal in structure. Pulmonic valve regurgitation is not visualized. No evidence of pulmonic stenosis. Aorta: The aortic root is normal in size and structure. Venous: The inferior vena cava is normal in size with less than 50% respiratory variability, suggesting right atrial pressure of 8 mmHg. IAS/Shunts: No atrial level shunt detected by color flow Doppler. Additional Comments: A device lead is visualized.  LEFT VENTRICLE PLAX 2D LVIDd:         5.67 cm      Diastology LVIDs:         5.20 cm      LV e' medial:    4.46 cm/s LV PW:         1.09 cm      LV E/e' medial:  19.1 LV IVS:        1.09 cm      LV e'  lateral:   7.51 cm/s LVOT diam:     1.90 cm      LV E/e' lateral: 11.4 LV SV:         25 LV SV Index:   15 LVOT Area:  2.84 cm                              3D Volume EF: LV Volumes (MOD)            3D EF:        25 % LV vol d, MOD A2C: 158.0 ml LV EDV:       224 ml LV vol d, MOD A4C: 204.0 ml LV ESV:       167 ml LV vol s, MOD A2C: 125.0 ml LV SV:        57 ml LV vol s, MOD A4C: 137.0 ml LV SV MOD A2C:     33.0 ml LV SV MOD A4C:     204.0 ml LV SV MOD BP:      48.1 ml RIGHT VENTRICLE RV Basal diam:  2.83 cm RV S prime:     15.90 cm/s TAPSE (M-mode): 2.0 cm LEFT ATRIUM              Index LA diam:        4.90 cm  2.99 cm/m LA Vol (A2C):   115.0 ml 70.29 ml/m LA Vol (A4C):   92.9 ml  56.78 ml/m LA Biplane Vol: 107.0 ml 65.40 ml/m  AORTIC VALVE                 PULMONIC VALVE AV Area (Vmax): 0.91 cm     PV Vmax:       1.32 m/s AV Vmax:        154.00 cm/s  PV Peak grad:  7.0 mmHg AV Peak Grad:   9.5 mmHg LVOT Vmax:      49.60 cm/s LVOT Vmean:     31.100 cm/s LVOT VTI:       0.087 m AI PHT:         466 msec  AORTA Ao Root diam: 2.70 cm MITRAL VALVE                TRICUSPID VALVE MV Area (PHT): 2.67 cm     TV Peak grad:   22.0 mmHg MV Decel Time: 284 msec     TV Vmax:        2.34 m/s MV E velocity: 85.30 cm/s   TR Peak grad:   26.8 mmHg MV A velocity: 106.00 cm/s  TR Vmax:        259.00 cm/s MV E/A ratio:  0.80                             SHUNTS                             Systemic VTI:  0.09 m                             Systemic Diam: 1.90 cm Ida Rogue MD Electronically signed by Ida Rogue MD Signature Date/Time: 05/08/2022/12:41:26 PM    Final (Updated)    CT CHEST WO CONTRAST  Result Date: 05/07/2022 CLINICAL DATA:  Shortness of breath EXAM: CT CHEST WITHOUT CONTRAST TECHNIQUE: Multidetector CT imaging of the chest was performed following the standard protocol without IV contrast. RADIATION DOSE REDUCTION: This exam was performed according to the departmental dose-optimization program which includes  automated exposure control, adjustment of the  mA and/or kV according to patient size and/or use of iterative reconstruction technique. COMPARISON:  Chest x-ray from earlier in the same day. FINDINGS: Cardiovascular: Somewhat limited due to lack of IV contrast. Atherosclerotic calcifications of the aorta are noted. Defibrillator is noted. Heart is mildly enlarged in size. Heavy coronary calcifications are seen. Mediastinum/Nodes: Thoracic inlet is within normal limits. No hilar or mediastinal adenopathy is noted. The esophagus is within normal limits. Lungs/Pleura: Moderate bilateral pleural effusions are noted with bibasilar atelectatic changes. No focal parenchymal nodules are seen. Upper Abdomen: Visualized upper abdomen is within normal limits. Musculoskeletal: Degenerative changes of the thoracic spine are noted. No acute rib abnormality is seen. IMPRESSION: Bilateral pleural effusions with basilar atelectatic changes. Cardiomegaly. Aortic Atherosclerosis (ICD10-I70.0). Electronically Signed   By: Inez Catalina M.D.   On: 05/07/2022 22:29   DG Chest 1 View  Result Date: 05/07/2022 CLINICAL DATA:  Shortness of breath EXAM: CHEST  1 VIEW COMPARISON:  01/03/2022 FINDINGS: Mild cardiomegaly with bibasilar atelectasis. No focal airspace consolidation. No pleural effusion. Unchanged position of AICD leads. IMPRESSION: Mild cardiomegaly and bibasilar atelectasis. Electronically Signed   By: Ulyses Jarred M.D.   On: 05/07/2022 19:31        Scheduled Meds:  atorvastatin  80 mg Oral Daily   carvedilol  12.5 mg Oral BID WC   clopidogrel  75 mg Oral Daily   furosemide  40 mg Intravenous BID   heparin  5,000 Units Subcutaneous Q8H   insulin aspart  0-15 Units Subcutaneous TID WC   insulin aspart  0-5 Units Subcutaneous QHS   irbesartan  75 mg Oral Daily   sodium chloride flush  3 mL Intravenous Q12H   Continuous Infusions:  sodium chloride       LOS: 1 day     Sidney Ace, MD [facility]   If 7PM-7AM, please contact night-coverage  05/08/2022, 1:39 PM

## 2022-05-08 NOTE — Evaluation (Signed)
Occupational Therapy Evaluation Patient Details Name: Melanie Ray MRN: [id number] DOB: 02-Oct-1951 Today's Date: 05/08/2022   History of Present Illness 71 y.o. female with a hx of CAD with anterior ST elevation MI in 03/2016 status post PCI/DES x2 to the LAD, HFrEF secondary to ICM status post CRT-D in 12/2021, cerebellar CVA in 03/2020, bilateral vertebral artery stenosis, LBBB, DM2, HTN, HLD, obsessive-compulsive disorder, hepatic steatosis, and OSA who presents to [facility] on 05/07/22 with 2-3 weeks sob and lower extremity swelling.   Clinical Impression   Pt was seen for OT evaluation this date. Prior to hospital admission, pt was indep with mobility, driving, and IADL tasks. She and her husband (who she says has dementia but is generally able to do things himself) have a dog (needs medications and pt expresses concern about getting back to the dog) and live in a 2 story home. Currently pt demonstrates impairments in activity tolerance, balance, and strength as described below (See OT problem list) which functionally limit her ability to perform ADL/self-care tasks. Pt currently requires CGA for LB ADL in standing, CGA for ADL transfers and mobility with and without AD in room. Pt endorsed feeling more steady with RW versus without AD. Pt would benefit from skilled OT services to address noted impairments and functional limitations (see below for any additional details) in order to maximize safety and independence while minimizing falls risk and caregiver burden. Upon hospital discharge, recommend HHOT to maximize pt safety and return to functional independence during meaningful occupations of daily life.     Recommendations for follow up therapy are one component of a multi-disciplinary discharge planning process, led by the attending physician.  Recommendations may be updated based on patient status, additional functional criteria and insurance authorization.   Follow Up Recommendations  Home  health OT    Assistance Recommended at Discharge PRN  Patient can return home with the following A little help with walking and/or transfers;A little help with bathing/dressing/bathroom;Assistance with cooking/housework;Assist for transportation;Help with stairs or ramp for entrance    Functional Status Assessment  Patient has had a recent decline in their functional status and demonstrates the ability to make significant improvements in function in a reasonable and predictable amount of time.  Equipment Recommendations  None recommended by OT    Recommendations for Other Services       Precautions / Restrictions Precautions Precautions: Fall Restrictions Weight Bearing Restrictions: No      Mobility Bed Mobility Overal bed mobility: Needs Assistance Bed Mobility: Supine to Sit, Sit to Supine     Supine to sit: Supervision Sit to supine: Supervision        Transfers Overall transfer level: Needs assistance Equipment used: Rolling walker (2 wheels), None Transfers: Sit to/from Stand Sit to Stand: Min guard           General transfer comment: more steady with RW, tends to brace herself against the bed      Balance Overall balance assessment: Needs assistance Sitting-balance support: No upper extremity supported, Feet supported Sitting balance-Leahy Scale: Fair     Standing balance support: No upper extremity supported, During functional activity Standing balance-Leahy Scale: Fair Standing balance comment: unsteady without AD while ambulating in the room, slow, and endorses improved balance with use of AD                           ADL either performed or assessed with clinical judgement   ADL  General ADL Comments: Pt able to complete LB dressing with SBA/CGA in standing to complete over hips, CGA for ADL mobility wiht and without AD     Vision         Perception     Praxis       Pertinent Vitals/Pain Pain Assessment Pain Assessment: No/denies pain     Hand Dominance     Extremity/Trunk Assessment Upper Extremity Assessment Upper Extremity Assessment: Overall WFL for tasks assessed   Lower Extremity Assessment Lower Extremity Assessment: Overall WFL for tasks assessed   Cervical / Trunk Assessment Cervical / Trunk Assessment: Normal   Communication     Cognition Arousal/Alertness: Awake/alert Behavior During Therapy: WFL for tasks assessed/performed Overall Cognitive Status: Within Functional Limits for tasks assessed                                       General Comments  Pt endorses hx of dizziness "all the time"    Exercises Other Exercises Other Exercises: pt educated in falls prevention and RW mgt   Shoulder Instructions      Home Living Family/patient expects to be discharged to:: Private residence Living Arrangements: Spouse/significant other (pt reports spouse has dementia but is generally self sufficient) Available Help at Discharge: Family Type of Home: House Home Access: Stairs to enter Technical brewer of Steps: 4 Entrance Stairs-Rails: Right;Left Home Layout: Two level;Bed/bath upstairs     Bathroom Shower/Tub: Occupational psychologist: Standard     Home Equipment: None          Prior Functioning/Environment Prior Level of Function : Driving;Independent/Modified Independent;History of Falls (last six months)             Mobility Comments: indep ADLs Comments: indep, 1 fall in May due to slipping on wet floor        OT Problem List: Decreased strength;Decreased activity tolerance;Impaired balance (sitting and/or standing);Decreased knowledge of use of DME or AE      OT Treatment/Interventions: Self-care/ADL training;Therapeutic exercise;Therapeutic activities;Energy conservation;Patient/family education;DME and/or AE instruction;Balance training    OT Goals(Current goals can  be found in the care plan section) Acute Rehab OT Goals Patient Stated Goal: go home to spouse and dog who needs meds OT Goal Formulation: With patient Time For Goal Achievement: 05/22/22 Potential to Achieve Goals: Good ADL Goals Pt Will Transfer to Toilet: with modified independence;ambulating (LRAD) Pt Will Perform Toileting - Clothing Manipulation and hygiene: with modified independence Additional ADL Goal #1: Pt will negotiate room for ADL tasks with supervision for safety, AD PRN, and no LOB, 5/5 opportunities. Additional ADL Goal #2: Pt will verbalize plan to implement at least 2 learned falls prevention strategies.  OT Frequency: Min 2X/week    Co-evaluation              AM-PAC OT "6 Clicks" Daily Activity     Outcome Measure Help from another person eating meals?: None Help from another person taking care of personal grooming?: None Help from another person toileting, which includes using toliet, bedpan, or urinal?: A Little Help from another person bathing (including washing, rinsing, drying)?: A Little Help from another person to put on and taking off regular upper body clothing?: None Help from another person to put on and taking off regular lower body clothing?: A Little 6 Click Score: 21   End of Session Equipment Utilized During Treatment: Rolling walker (2  wheels);Gait belt  Activity Tolerance: Patient tolerated treatment well Patient left: in bed;with call bell/phone within reach;with bed alarm set  OT Visit Diagnosis: Other abnormalities of gait and mobility (R26.89);Muscle weakness (generalized) (M62.81);History of falling (Z91.81)                Time: 9643-8381 OT Time Calculation (min): 24 min Charges:  OT General Charges $OT Visit: 1 Visit OT Evaluation $OT Eval Moderate Complexity: 1 Mod OT Treatments $Self Care/Home Management : 8-22 mins  Ardeth Perfect., MPH, MS, OTR/L ascom [phone] 05/08/22, 4:57 PM

## 2022-05-08 NOTE — Consult Note (Signed)
Cardiology Consultation:   Patient ID: Melanie Ray; [id number]; [birth year]   Admit date: 05/07/2022 Date of Consult: 05/08/2022  Primary Care Provider: Leone Haven, MD Primary Cardiologist: [place] Primary Electrophysiologist:  Quentin Ore   Patient Profile:   Melanie Ray is a 71 y.o. female with a hx of CAD with anterior ST elevation MI in 03/2016 status post PCI/DES x2 to the LAD, HFrEF secondary to ICM status post CRT-D in 12/2021, cerebellar CVA in 03/2020, bilateral vertebral artery stenosis, LBBB, DM2, HTN, HLD, obsessive-compulsive disorder, hepatic steatosis, and OSA who is being seen today for the evaluation of acute on chronic HFrEF at the request of Dr. Marcello Moores.  History of Present Illness:   Melanie Ray is followed by the advanced heart failure service and EP.  Most recent ischemic evaluation in 05/2020 demonstrated an EF of 20% with no ischemia.  Despite percutaneous revascularization at time of anterior STEMI in 2017 and optimization of GDMT, her cardiomyopathy persisted with echo in 03/2020 showing further decline in her EF to less than 20%.  Cardiac MRI in 11/2021 showed no significant LGE with moderately dilated LV with diffuse hypokinesis and dyssynchrony consistent with LBBB, EF 17%, at least moderate mitral regurgitation.  With lack of LGE, it was felt LBBB may be contributing to her cardiomyopathy, and in this setting she underwent CRT-D in 12/2021.  Follow-up echo on 04/11/2022 demonstrated improvement in her LV systolic function with an EF of 25 to 30% (previously 17% by cardiac MRI prior to CRT), global hypokinesis, mildly dilated LV internal cavity size, grade 2 diastolic dysfunction, normal RV systolic function and ventricular cavity size, normal PASP, moderately dilated left atrium, mild to moderate mitral regurgitation, mild aortic insufficiency, and an estimated right atrial pressure of 3 mmHg.   She was seen by the advanced heart failure clinic in  03/2022 and felt to be euvolemic with a weight of 59.3 kg.   She presented to [facility] ED on the evening of 05/07/2022, after having been evaluated by her PCP with progressive dyspnea for approximately 3 weeks without frank chest pain.  There was also associated lower extremity swelling.  She indicated she had been thinking about her sister who passed 11 to 12 years ago.  She denies any dietary changes or missing any medications.  Labs obtained through their office showed a BNP greater than 4500.  She was sent to the ED.  Upon her arrival, she was noted to be hypertensive with blood pressures in the 696E to 952W systolic, afebrile, heart rates in the 60s to 90s bpm, oxygen saturation 96% on room air, subsequently transition to supplemental oxygen via nasal cannula at 2 L.  EKG showed AV paced rhythm, 72 bpm.  Chest x-ray showed mild cardiomegaly with bibasilar atelectasis.  CT chest showed bilateral pleural effusions with basilar atelectasis changes, cardiomegaly, and aortic atherosclerosis.  Initial high-sensitivity troponin 20 with a delta troponin 17, currently trending to 23.  BNP 2597.  Potassium 4.0 with hemolysis noted, BUN 22, serum creatinine 0.88, Hgb 12.4, PLT 263.  In the ED, she received IV Lasix 60 mg, and was placed on Lasix 40 mg twice daily upon admission.  Documented urine output 1.8 L for the admission.  Since undergoing IV diuresis, she notes a significant improvement in her dyspnea and resolution of her lower extremity swelling.  She is without chest pain.  Past Medical History:  Diagnosis Date   Anemia    Anxiety    Back pain  CAD (coronary artery disease) 04/11/2016   S/p ant STEMI 5/17: LHC >> LAD proximal 80%, mid 80%, distal 50%, ostial D1 60%; LCx with LPDA lesion 30%; RCA Mild calcification with no significant stenosis in a medium caliber, nondominant RCA; LVEF is estimated at 45% with inferoapical and lateral wall akinesis >> PCI: PCI: 3.5 x 24 mm Promus DES to prox LAD, 2.5 x 12 mm  Promus DES to mid LAD. // Myoview 7/21: EF 20, no ischemia; high risk    Chest pain    Chronic systolic CHF (congestive heart failure) ([place]) 03/21/2016   Echo 01/30/17: Diff HK, mild focal basal septal hypertrophy, EF 30-35, mild AI, MAC, mild MR // Echo 06/08/16: Mild focal basal septal hypertrophy, EF 25-30%, diff HK, ant-septal AK, Gr 1 DD, mild AI, MAC, mild MR, PASP 37 mmHg // Echo 03/18/16: EF 30-35%, ant-septal AK, Gr 1 DD, mild MR, severe LAE.   Constipation    Depression    Diabetes mellitus type 2 in obese (HCC)    Dizziness    Glaucoma    Gout    Heart disease    Heartburn    History of acute anterior wall MI 03/17/2016   History of heart attack    Hyperlipemia    Hypertension    Ischemic cardiomyopathy 10/02/2016   Refused ICD   Joint pain    Left bundle branch block    Myocardial infarction ([place])    Nausea    Positive colorectal cancer screening using Cologuard test 03/01/2020   Sleep apnea    SOB (shortness of breath)    Suicidal ideation 08/27/2018   Swelling    feet or legs    Past Surgical History:  Procedure Laterality Date   APPENDECTOMY     BIV ICD INSERTION CRT-D N/A 01/02/2022   Procedure: BIV ICD INSERTION CRT-D;  Surgeon: Vickie Epley, MD;  Location: [place] CV [facility];  Service: Cardiovascular;  Laterality: N/A;   CARDIAC CATHETERIZATION N/A 03/17/2016   Procedure: Left Heart Cath and Coronary Angiography;  Surgeon: Sherren Mocha, MD;  Location: [place] CV [facility];  Service: Cardiovascular;  Laterality: N/A;   CARDIAC CATHETERIZATION N/A 03/17/2016   Procedure: Coronary Stent Intervention;  Surgeon: Sherren Mocha, MD;  Location: [place] [facility];  Service: Cardiovascular;  Laterality: N/A;   COLONOSCOPY WITH PROPOFOL N/A 08/13/2017   Procedure: COLONOSCOPY WITH PROPOFOL;  Surgeon: Jonathon Bellows, MD;  Location: [facility] ENDOSCOPY;  Service: Gastroenterology;  Laterality: N/A;   COLONOSCOPY WITH PROPOFOL N/A 03/29/2020   Procedure: COLONOSCOPY WITH PROPOFOL;   Surgeon: Lucilla Lame, MD;  Location: [facility] ENDOSCOPY;  Service: Endoscopy;  Laterality: N/A;   SMALL BOWEL REPAIR     TONSILLECTOMY     UTERINE FIBROID SURGERY       Home Meds: Prior to Admission medications   Medication Sig Start Date End Date Taking? Authorizing Provider  atorvastatin (LIPITOR) 80 MG tablet Take 1 tablet by mouth once daily 11/20/21  Yes Bensimhon, Shaune Pascal, MD  carvedilol (COREG) 12.5 MG tablet TAKE 1 TABLET BY MOUTH TWICE DAILY WITH MEALS 04/18/22  Yes Sherren Mocha, MD  clopidogrel (PLAVIX) 75 MG tablet Take 1 tablet by mouth once daily 04/30/22  Yes Leone Haven, MD  dorzolamide-timolol (COSOPT) 22.3-6.8 MG/ML ophthalmic solution Place 1 drop into both eyes 2 (two) times daily.  01/17/15  Yes [provider]  furosemide (LASIX) 20 MG tablet Take 1 tablet (20 mg total) by mouth daily. 05/07/22  Yes Leone Haven, MD  isosorbide mononitrate (IMDUR) 30 MG 24 hr tablet Take 1 tablet (30 mg total) by mouth daily. 02/27/21  Yes Sherren Mocha, MD  latanoprost (XALATAN) 0.005 % ophthalmic solution Place 1 drop into both eyes at bedtime. 03/15/20  Yes [provider]  lurasidone (LATUDA) 40 MG TABS tablet Take 40 mg by mouth daily with breakfast.   Yes [provider]  traZODone (DESYREL) 150 MG tablet Take 150 mg by mouth at bedtime. 10/23/21  Yes [provider]  valsartan (DIOVAN) 80 MG tablet Take 80 mg by mouth daily.   Yes [provider]  venlafaxine XR (EFFEXOR-XR) 150 MG 24 hr capsule TAKE 2 CAPSULES BY MOUTH ONCE DAILY (NEEDS to follow-up with psychiatry for further refills) 11/02/20  Yes Leone Haven, MD  LORazepam (ATIVAN) 0.5 MG tablet Take 0.5 mg by mouth daily as needed for anxiety. 10/23/21   [provider]  nitroGLYCERIN (NITROSTAT) 0.4 MG SL tablet DISSOLVE ONE TABLET UNDER THE TONGUE EVERY FIVE MINUTES AS NEEDED FOR CHEST PAIN. DO NOT EXCEED A TOTAL OF THREE DOSES IN 15 MINUTES 12/11/21   Sherren Mocha, MD    Inpatient Medications: Scheduled Meds:  furosemide  40 mg Intravenous BID   heparin  5,000 Units Subcutaneous Q8H   sodium chloride flush  3 mL Intravenous Q12H   Continuous Infusions:  sodium chloride     PRN Meds: sodium chloride, acetaminophen, sodium chloride flush  Allergies:   Allergies  Allergen Reactions   Tetracycline Swelling   Meperidine Nausea And Vomiting    Nausea   Entresto [Sacubitril-Valsartan] Other (See Comments)    Shortness of Breath    Social History:   Social History   Socioeconomic History   Marital status: Married    Spouse name: Aniesha Haughn   Number of children: 0   Years of education: BS in education   Highest education level: Not on file  Occupational History   Occupation:  Retired Education officer, museum  Tobacco Use   Smoking status: Former    Types: Cigarettes    Quit date: 04/13/1997    Years since quitting: 25.0   Smokeless tobacco: Never  Vaping Use   Vaping Use: Never used  Substance and Sexual Activity   Alcohol use: Not Currently   Drug use: No   Sexual activity: Not Currently  Other Topics Concern   Not on file  Social History Narrative   Lives in [place] with spouse.  No children.   Retired first [employer] for over 30 years ([place] for 10 years and then in [place] for over 20 years).   Left-handed   Lives in a two story home       Social Determinants of Health   Financial Resource Strain: Low Risk  (03/30/2022)   Overall Financial Resource Strain (CARDIA)    Difficulty of Paying Living Expenses: Not hard at all  Food Insecurity: No Food Insecurity (03/30/2022)   Hunger Vital Sign    Worried About Running Out of Food in the Last Year: Never true    Ran Out of Food in the Last Year: Never true  Transportation Needs: No Transportation Needs (03/30/2022)   PRAPARE - Hydrologist (Medical): No    Lack of Transportation (Non-Medical): No  Physical Activity: Not on  file  Stress: No Stress Concern Present (03/30/2022)   [place]    Feeling of Stress : Not at all  Social Connections:  Unknown (03/30/2022)   Social Connection and Isolation Panel [NHANES]    Frequency of Communication with Friends and Family: Not on file    Frequency of Social Gatherings with Friends and Family: Not on file    Attends Religious Services: Not on file    Active Member of Clubs or Organizations: Not on file    Attends Archivist Meetings: Not on file    Marital Status: Married  Intimate Partner Violence: Not At Risk (03/30/2022)   Humiliation, Afraid, Rape, and Kick questionnaire    Fear of Current or Ex-Partner: No    Emotionally Abused: No    Physically Abused: No    Sexually Abused: No     Family History:   Family History  Problem Relation Age of Onset   Anxiety disorder Mother    Paranoid behavior Mother    Hypertension Mother    Dementia Mother    High Cholesterol Mother    Diabetes Mother    Hyperlipidemia Mother    Depression Mother    Hypertension Father    High Cholesterol Father    Mood Disorder Sister    Stroke Sister    Anxiety disorder Maternal Aunt    Tuberculosis Paternal Grandfather    Drug abuse Cousin     ROS:  Review of Systems  Constitutional:  Positive for malaise/fatigue. Negative for chills, diaphoresis, fever and weight loss.  HENT:  Negative for congestion.   Eyes:  Negative for discharge and redness.  Respiratory:  Positive for shortness of breath. Negative for cough, sputum production and wheezing.   Cardiovascular:  Positive for leg swelling. Negative for chest pain, palpitations, orthopnea, claudication and PND.  Gastrointestinal:  Negative for abdominal pain, heartburn, nausea and vomiting.  Musculoskeletal:  Negative for falls and myalgias.  Skin:  Negative for rash.  Neurological:  Positive for weakness. Negative for dizziness, tingling, tremors,  sensory change, speech change, focal weakness and loss of consciousness.  Endo/Heme/Allergies:  Does not bruise/bleed easily.  Psychiatric/Behavioral:  Negative for substance abuse. The patient is nervous/anxious.   All other systems reviewed and are negative.     Physical Exam/Data:   Vitals:   05/08/22 0330 05/08/22 0430 05/08/22 0700 05/08/22 0730  BP: (!) 162/66 (!) 147/60 (!) 168/77 (!) 177/78  Pulse: 74 70 81 79  Resp: 17 18  (!) 24  Temp:      TempSrc:      SpO2: 97% 94% 91% 100%  Weight:      Height:        Intake/Output Summary (Last 24 hours) at 05/08/2022 0801 Last data filed at 05/08/2022 0053 Gross per 24 hour  Intake 3 ml  Output 1800 ml  Net -1797 ml   Filed Weights   05/07/22 1841  Weight: 61.2 kg   Body mass index is 23.91 kg/m.   Physical Exam: General: Well developed, well nourished, in no acute distress. Head: Normocephalic, atraumatic, sclera non-icteric, no xanthomas, nares without discharge.  Neck: Negative for carotid bruits. JVD not elevated. Lungs: Diminished breath sounds bilaterally with bibasilar crackles. Breathing is unlabored. Heart: RRR with S1 S2. II/VI systolic murmur, no rubs, or gallops appreciated. Abdomen: Soft, non-tender, non-distended with normoactive bowel sounds. No hepatomegaly. No rebound/guarding. No obvious abdominal masses. Msk:  Strength and tone appear normal for age. Extremities: No clubbing or cyanosis. No edema. Distal pedal pulses are 2+ and equal bilaterally. Neuro: Alert and oriented X 3. No facial asymmetry. No focal deficit. Moves all extremities spontaneously. Psych:  Responds to questions appropriately with a normal affect.   EKG:  The EKG was personally reviewed and demonstrates: AV paced rhythm, 72 bpm Telemetry:  Telemetry was personally reviewed and demonstrates: Paced rhythm   Weights: Filed Weights   05/07/22 1841  Weight: 61.2 kg    Relevant CV Studies:  2D echo 04/11/2022: 1. Left ventricular  ejection fraction, by estimation, is 25 to 30%. The  left ventricle has severely decreased function. The left ventricle  demonstrates global hypokinesis. The left ventricular internal cavity size  was mildly dilated. Left ventricular  diastolic parameters are consistent with Grade II diastolic dysfunction  (pseudonormalization). The average left ventricular global longitudinal  strain is -8.5 %. The global longitudinal strain is abnormal.   2. Right ventricular systolic function is normal. The right ventricular  size is normal. There is normal pulmonary artery systolic pressure.   3. Left atrial size was mild to moderately dilated.   4. The mitral valve is normal in structure. Mild to moderate mitral valve  regurgitation.   5. The aortic valve is tricuspid. Aortic valve regurgitation is mild.   6. The inferior vena cava is normal in size with greater than 50%  respiratory variability, suggesting right atrial pressure of 3 mmHg. __________  Cardiac MRI 11/08/2021: IMPRESSION: 1. Moderately dilated LV with diffuse hypokinesis and septal-lateral dyssynchrony consistent with LBBB. EF 17%. 2.  Normal RV size and systolic function, EF 61%. 3. At least moderate MR, likely functional with posterior leaflet restriction. Unable to quantify as flow sequences not done. 4.  No significant LGE noted.   With lack of LGE, LBBB cardiomyopathy may be a major contributor to low EF. __________  [facility] 05/16/2020: The left ventricular ejection fraction is severely decreased (<30%). Nuclear stress EF: 20%. There was no ST segment deviation noted during stress. This is a high risk study.   High risk study due to severely decreased left ventricular systolic function. No ischemia is seen, although "balanced ischemia" cannot be entirely excluded. There is profound systolic LBBB-related dyssynchrony. Findings are consistent with dilated cardiomyopathy and suggest high likelihood of response to  resynchronization therapy. __________  2D echo 03/31/2020: 1. Left ventricular ejection fraction, by estimation, is <20%. The left  ventricle has severely decreased function. The left ventricle demonstrates  global hypokinesis. The left ventricular internal cavity size was severely  dilated. There is moderate left   ventricular hypertrophy. Left ventricular diastolic function could not be  evaluated.   2. Right ventricular systolic function is normal. The right ventricular  size is normal. There is mildly elevated pulmonary artery systolic  pressure.   3. Left atrial size was moderately dilated.   4. The mitral valve is normal in structure. Moderate mitral valve  regurgitation.   5. The aortic valve is normal in structure. Aortic valve regurgitation is  trivial.   6. Agitated saline contrast bubble study was negative, with no evidence  of any interatrial shunt. __________  2D echo 06/30/2018: - Left ventricle: The cavity size was normal. There was mild    concentric hypertrophy. Systolic function was moderately to    severely reduced. The estimated ejection fraction was in the    range of 30% to 35%. Hypokinesis of the basal-midanteroseptal,    inferior, and inferoseptal myocardium. Doppler parameters are    consistent with abnormal left ventricular relaxation (grade 1    diastolic dysfunction).  - Aortic valve: Transvalvular velocity was within the normal range.    There was no stenosis. There was  mild to moderate regurgitation.  - Mitral valve: Calcified annulus. Mobile density (2cm x 0.5 cm) on    the atrial side of the posterior leaflet of the mitral valve.    Transvalvular velocity was within the normal range. There was no    evidence for stenosis. There was mild regurgitation.  - Right ventricle: The cavity size was normal. Wall thickness was    normal. Systolic function was normal.  - Tricuspid valve: There was mild regurgitation.  - Pulmonary arteries: Systolic pressure  was within the normal    range. PA peak pressure: 39 mm Hg (S).   Impressions:   - Compared with the echo 01/2017, the mobile mitral valve density is    unchanged. __________  2D echo 01/30/2017: - Left ventricle: Diffuse hypokinesis with abnormal septal motion    and marked dysnergy between septum and lateral wall. The cavity    size was moderately dilated. There was mild focal basal    hypertrophy of the septum. Systolic function was moderately to    severely reduced. The estimated ejection fraction was in the    range of 30% to 35%. Doppler parameters are consistent with both    elevated ventricular end-diastolic filling pressure and elevated    left atrial filling pressure.  - Aortic valve: There was mild regurgitation.  - Mitral valve: Calcified annulus. Mildly thickened leaflets .    There was mild regurgitation.  - Atrial septum: No defect or patent foramen ovale was identified. __________  2D echo 06/08/2016: - Left ventricle: The cavity size was normal. There was mild focal    basal hypertrophy of the septum. Systolic function was severely    reduced. The estimated ejection fraction was in the range of 25%    to 30%. Diffuse hypokinesis. There is akinesis of the    mid-apicalanteroseptal myocardium. Doppler parameters are    consistent with abnormal left ventricular relaxation (grade 1    diastolic dysfunction).  - Ventricular septum: Septal motion showed abnormal function and    dyssynergy.  - Aortic valve: Trileaflet; mildly thickened, mildly calcified    leaflets. There was mild regurgitation.  - Mitral valve: Moderately calcified annulus. Mildly thickened    leaflets . There was mild regurgitation.  - Pulmonary arteries: Systolic pressure was mildly increased. PA    peak pressure: 37 mm Hg (S).   Impressions:   - Compared to the prior study, there has been no significant    interval change. __________  2D echo 03/18/2016: - Left ventricle: The cavity size was  normal. Wall thickness was    normal. Systolic function was moderately to severely reduced. The    estimated ejection fraction was in the range of 30% to 35%.    Akinesis of the entireanteroseptal myocardium; consistent with    infarction. Doppler parameters are consistent with abnormal left    ventricular relaxation (grade 1 diastolic dysfunction).  - Mitral valve: Mildly calcified annulus. There was mild    regurgitation.  - Left atrium: The atrium was severely dilated.  __________  [facility] 03/17/2016: LPDA lesion, 30% stenosed. Ost 1st Diag to 1st Diag lesion, 60% stenosed. Dist LAD lesion, 50% stenosed. Prox LAD lesion, 80% stenosed. Post intervention, there is a 0% residual stenosis. Mid LAD lesion, 80% stenosed. Post intervention, there is a 0% residual stenosis. There is mild left ventricular systolic dysfunction.   1. Acute anterolateral ST segment elevation MI with successful stenting of the proximal and mid LAD   2. Widely patent, dominant  left circumflex   3. Patent, nondominant RCA   4. Proximal LAD dissection treated successfully with primary stenting   5. Moderate segmental LV systolic dysfunction with LVEF estimated at 45%   The patient was treated with heparin and Aggrastat single bolus in the cardiac catheterization lab. Will load her with brilinta on arrival to the CCU. Would treat with aspirin and brilinta for at least 12 months. Initiate post MI medical therapy.   Laboratory Data:  Chemistry Recent Labs  Lab 05/07/22 1610 05/07/22 1939  NA 142 142  K 4.3 4.0  CL 112* 115*  CO2 23 21*  GLUCOSE 149* 103*  BUN 23 22  CREATININE 1.10* 0.88  CALCIUM 9.4 8.8*  GFRNONAA 53* >60  ANIONGAP 7 6    No results for input(s): "PROT", "ALBUMIN", "AST", "ALT", "ALKPHOS", "BILITOT" in the last 168 hours. Hematology Recent Labs  Lab 05/07/22 1610 05/07/22 1843  WBC 9.4 8.0  RBC 4.97 4.64  HGB 13.0 12.4  HCT 41.5 39.1  MCV 83.5 84.3  MCH 26.2 26.7  MCHC 31.3  31.7  RDW 15.4 15.8*  PLT 325 263   Cardiac EnzymesNo results for input(s): "TROPONINI" in the last 168 hours. No results for input(s): "TROPIPOC" in the last 168 hours.  BNP Recent Labs  Lab 05/07/22 1610 05/07/22 1853  BNP >4,500.0* 2,597.3*    DDimer No results for input(s): "DDIMER" in the last 168 hours.  Radiology/Studies:  CT CHEST WO CONTRAST  Result Date: 05/07/2022 IMPRESSION: Bilateral pleural effusions with basilar atelectatic changes. Cardiomegaly. Aortic Atherosclerosis (ICD10-I70.0). Electronically Signed   By: Inez Catalina M.D.   On: 05/07/2022 22:29   DG Chest 1 View  Result Date: 05/07/2022 IMPRESSION: Mild cardiomegaly and bibasilar atelectasis. Electronically Signed   By: Ulyses Jarred M.D.   On: 05/07/2022 19:31    Assessment and Plan:   1. Acute on chronic HFrEF secondary to ICM:  -She was admitted with a 3-week history of increased dyspnea and lower extremity swelling.  After receiving 1 dose of IV Lasix she notes resolution of her lower extremity swelling with improvement in underlying dyspnea -She does continue to be volume up -Given her significant cardiomyopathy, she will have an elevated BNP at baseline -Continue IV Lasix with close monitoring of urine output and renal function -Recent echo obtained less than 1 month ago demonstrated improvement in LV systolic function with an EF of 30% (previously 17% by cardiac MRI) -Escalation of GDMT has been limited by financial constraints, AKI, and tolerance -She has not been able to be maintained on Entresto secondary to cost and intolerance with noted shortness of breath -She has been off spironolactone secondary to renal dysfunction -She has been off SGLT2 inhibitor secondary to cost and has been reluctant to retry -PTA carvedilol and valsartan, not continued upon admission, will resume (irbesartan in place of valsartan during admission secondary to formula) -She is hopeful for discharge within the next 24 hours  as she indicates her husband is at home with dementia and she needs to give her dog insulin  2. CAD involving the native coronary arteries with elevated high-sensitivity troponin:  -Never with frank chest pain -Minimally elevated and flat trending high-sensitivity troponin, not consistent with ACS -No indication for heparin drip -Likely supply demand ischemia in the setting of volume overload and hypertension -We will resume PTA carvedilol, clopidogrel, and atorvastatin -No plans for inpatient ischemic evaluation at this time  3. HTN:  -Blood pressure remains elevated, prior to admission valsartan and carvedilol  were not continued upon admission -Resume home medications  4. LBBB:  -Status post CRT-D in 12/2021   5. Mitral regurgitation:  -Moderate on most recent echo  -Monitor status post CRT  -Outpatient follow up  6.  Obsessive-compulsive disorder: -Appears stable    For questions or updates, please contact [place] Please consult [email or web address] for contact info under Cardiology/STEMI.   Signed, Christell Faith, PA-C [place] Pager: [phone] 05/08/2022, 8:01 AM

## 2022-05-09 ENCOUNTER — Encounter: Payer: Self-pay | Admitting: Internal Medicine

## 2022-05-09 DIAGNOSIS — I5023 Acute on chronic systolic (congestive) heart failure: Secondary | ICD-10-CM | POA: Diagnosis not present

## 2022-05-09 LAB — BASIC METABOLIC PANEL
Anion gap: 7 (ref 5–15)
BUN: 22 mg/dL (ref 8–23)
CO2: 26 mmol/L (ref 22–32)
Calcium: 9.5 mg/dL (ref 8.9–10.3)
Chloride: 108 mmol/L (ref 98–111)
Creatinine, Ser: 1.1 mg/dL — ABNORMAL HIGH (ref 0.44–1.00)
GFR, Estimated: 53 mL/min — ABNORMAL LOW (ref >60)
Glucose, Bld: 145 mg/dL — ABNORMAL HIGH (ref 70–99)
Potassium: 3.3 mmol/L — ABNORMAL LOW (ref 3.5–5.1)
Sodium: 141 mmol/L (ref 135–145)

## 2022-05-09 LAB — GLUCOSE, CAPILLARY: Glucose-Capillary: 108 mg/dL — ABNORMAL HIGH (ref 70–99)

## 2022-05-09 MED ORDER — FUROSEMIDE 40 MG PO TABS: 40.0000 mg | ORAL_TABLET | Freq: Every day | ORAL | Status: DC

## 2022-05-09 MED ORDER — FUROSEMIDE 20 MG PO TABS: 10.0000 mg | ORAL_TABLET | Freq: Every day | ORAL | 0 refills | Status: DC

## 2022-05-09 NOTE — Progress Notes (Signed)
Pt arrived to unit. Vitals and blood sugar monitoring completed. PT states she is not a diabetic but reports "taking herself off of metformin". Fingerstick blood sugar is 102. Informed pt of current plan to monitor blood sugar before meals and at bedtime. Will follow up with dayshift as to whether to continue monitoring or not.

## 2022-05-09 NOTE — TOC Initial Note (Signed)
Transition of Care [facility]) - Initial/Assessment Note    Patient Details  Name: Melanie Ray MRN: [id number] Date of Birth: Mar 21, 1951  Transition of Care [facility]) CM/SW Contact:    Alberteen Sam, LCSW Phone Number: 05/09/2022, 11:55 AM  Clinical Narrative:                  CSW met with patient at bedside to assess for [facility] needs, patient is from home with husband who has dementia. Patient is in agreement with [facility] PT and OT with no preference of agency , referral given to jason with [place]. Patient reports PCP is Dr. Caryl Bis, patient uses [facility] on [address]. Patient reports no dme needs, states her friend will pick her up at time of discharge.   Expected Discharge Plan: [place] Barriers to Discharge: Continued Medical Work up   Patient Goals and CMS Choice Patient states their goals for this hospitalization and ongoing recovery are:: to go home CMS Medicare.gov Compare Post Acute Care list provided to:: Patient Choice offered to / list presented to : Patient  Expected Discharge Plan and Services Expected Discharge Plan: [place]       Living arrangements for the past 2 months: Single Family Home                                      Prior Living Arrangements/Services Living arrangements for the past 2 months: Single Family Home Lives with:: Spouse                   Activities of Daily Living Home Assistive Devices/Equipment: None ADL Screening (condition at time of admission) Patient's cognitive ability adequate to safely complete daily activities?: Yes Is the patient deaf or have difficulty hearing?: No Does the patient have difficulty seeing, even when wearing glasses/contacts?: No Does the patient have difficulty concentrating, remembering, or making decisions?: No Patient able to express need for assistance with ADLs?: Yes Does the patient have difficulty dressing or bathing?: No Independently  performs ADLs?: Yes (appropriate for developmental age) Does the patient have difficulty walking or climbing stairs?: No Weakness of Legs: None Weakness of Arms/Hands: None  Permission Sought/Granted                  Emotional Assessment       Orientation: : Oriented to Self, Oriented to Place, Oriented to  Time, Oriented to Situation Alcohol / Substance Use: Not Applicable Psych Involvement: No (comment)  Admission diagnosis:  SOB (shortness of breath) [R06.02] CHF exacerbation (HCC) [I50.9] Congestive heart failure, unspecified HF chronicity, unspecified heart failure type ([place]) [I50.9] Patient Active Problem List   Diagnosis Date Noted   CHF exacerbation ([place]) 05/07/2022   ICD (implantable cardioverter-defibrillator) in place 01/02/2022   Finger fracture 12/19/2021   Acute on chronic systolic CHF (congestive heart failure) ([place]) 11/04/2021   Acute CHF (congestive heart failure) ([place]) 11/04/2021   Anemia, unspecified 11/02/2021   Dyspnea 04/21/2021   Generalized weakness 04/21/2021   Palpitations 09/14/2020   Memory difficulty 06/28/2020   Cerebellar stroke, acute ([place]) 03/30/2020   Nightmares 03/01/2020   Left bundle branch block 12/03/2018   QT prolongation 12/03/2018   Obsessive compulsive disorder 08/27/2018   Hot flash, menopausal 02/07/2018   Vitamin D deficiency 06/17/2017   Class 1 obesity without serious comorbidity with body mass index (BMI) of 30.0 to  30.9 in adult 06/17/2017   Dizziness 01/03/2017   Ischemic cardiomyopathy 10/02/2016   CAD (coronary artery disease) 79/15/0569   Chronic systolic CHF (congestive heart failure) ([place]) 03/21/2016   History of acute anterior wall MI 03/18/2016   DM type 2 (diabetes mellitus, type 2) ([place]) 08/03/2015   Essential hypertension 07/28/2015   HLD (hyperlipidemia) 07/28/2015   Glaucoma 07/28/2015   Depression 07/28/2015   Anxiety 07/28/2015   Insomnia 07/28/2015   Overweight (BMI 25.0-29.9) 07/28/2015    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 [place] [address] Phone: [phone] Fax: [phone]     Social Determinants of Health (SDOH) Interventions    Readmission Risk Interventions     No data to display

## 2022-05-09 NOTE — Progress Notes (Signed)
Physical Therapy Evaluation Patient Details Name: Melanie Ray MRN: [id number] DOB: 07/23/1951 Today's Date: 05/09/2022  History of Present Illness  71 y.o. female with a hx of CAD with anterior ST elevation MI in 03/2016 status post PCI/DES x2 to the LAD, HFrEF secondary to ICM status post CRT-D in 12/2021, cerebellar CVA in 03/2020, bilateral vertebral artery stenosis, LBBB, DM2, HTN, HLD, obsessive-compulsive disorder, hepatic steatosis, and OSA who presents to [facility] on [facility]07[facility]22 with 2-3 weeks sob and lower extremity swelling.   Clinical Impression  Pt is a pleasant 71 year old female who was admitted for probable CHF exacerbation. Pt performs bed mobility mod I, transfers with supervision, and ambulation with CGA. Pt does not utilized AD. Pt ambulated 200 feet in hallway with occasional unsteadiness with ability to self-correct, no LOB. Pt ambualtes with decreased gait speed, NBOS and small step lengths. Pt reported this is her baseline for walking. Pt completed 5x sit-to-stand in 14.67 sec indicating decreased LE power and increased falls risk. Pt demonstrates deficits with balance, endurance, and activity tolerance. Would benefit from skilled PT to address above deficits and promote optimal return to PLOF. Currently recommend outpatient PT at discharge due to pt current mobility status, balance and endurance deficits.     Recommendations for follow up therapy are one component of a multi-disciplinary discharge planning process, led by the attending physician.  Recommendations may be updated based on patient status, additional functional criteria and insurance authorization.  Follow Up Recommendations Outpatient PT      Assistance Recommended at Discharge PRN  Patient can return home with the following  A little help with walking and/or transfers;Help with stairs or ramp for entrance    Equipment Recommendations None recommended by PT  Recommendations for Other Services        Functional Status Assessment Patient has had a recent decline in their functional status and demonstrates the ability to make significant improvements in function in a reasonable and predictable amount of time.     Precautions / Restrictions Precautions Precautions: None Restrictions Weight Bearing Restrictions: No      Mobility  Bed Mobility Overal bed mobility: Modified Independent Bed Mobility: Supine to Sit     Supine to sit: Modified independent (Device/Increase time)     General bed mobility comments: Increased time/effort to complete but no physical assist needed.    Transfers Overall transfer level: Needs assistance Equipment used: None Transfers: Sit to/from Stand Sit to Stand: Supervision           General transfer comment: Pt completed sit to stand with Supervision due to pt report of history of dizziness. Pt did not need physical assist and not dizzy at this time.    Ambulation/Gait Ambulation/Gait assistance: Min guard Gait Distance (Feet): 200 Feet Assistive device: None Gait Pattern/deviations: Step-to pattern, Narrow base of support, Shuffle, Decreased step length - left, Decreased step length - right, Decreased stride length Gait velocity: decreased     General Gait Details: Pt ambulated with short, narrow steps that were shuffle-like; slow and controlled with ambulation. Pt reported her ambulation quality and speed being her baseline.  Stairs            Wheelchair Mobility    Modified Rankin (Stroke Patients Only)       Balance Overall balance assessment: Needs assistance Sitting-balance support: No upper extremity supported, Feet supported Sitting balance-Leahy Scale: Good Sitting balance - Comments: Pt able to sit EOB raising bilat UE up for a ROM screen and able  to sit while bilat LE MMT conducted without LOB   Standing balance support: No upper extremity supported, During functional activity Standing balance-Leahy Scale:  Fair Standing balance comment: Pt stood and ambulated in the hallway without UE support and no LOB. Pt appeared slightly unsteady at times possible due to narrow base of support in walking, but pt able to self-correct with no LOB or physical assist.                             Pertinent Vitals/Pain Pain Assessment Pain Assessment: No/denies pain    Home Living Family/patient expects to be discharged to:: Private residence Living Arrangements: Spouse/significant other (pt reports spouse has dementia but is generally self sufficient) Available Help at Discharge: Family Type of Home: House Home Access: Stairs to enter Entrance Stairs-Rails: Can reach both Entrance Stairs-Number of Steps: 4   Home Layout: Two level;Bed/bath upstairs Home Equipment: None Additional Comments: Pt enjoys reading, watching television, and playing with her [place].    Prior Function Prior Level of Function : Driving;Independent/Modified Independent;History of Falls (last six months)             Mobility Comments: Independent, no AD, 1 fall in May slipped on wet floor ADLs Comments: Independent with ADLs     Hand Dominance   Dominant Hand: Left    Extremity/Trunk Assessment   Upper Extremity Assessment Upper Extremity Assessment: Overall WFL for tasks assessed    Lower Extremity Assessment Lower Extremity Assessment: Overall WFL for tasks assessed (Gross bilat LE MMT 4/5 (hip flex, knee ext, knee flex, ankle DF) tested seated EOB. Pt able to complete bilat heel slides, SLR in supine.)    Cervical / Trunk Assessment Cervical / Trunk Assessment: Normal  Communication   Communication: No difficulties  Cognition Arousal/Alertness: Awake/alert Behavior During Therapy: WFL for tasks assessed/performed Overall Cognitive Status: Within Functional Limits for tasks assessed                                 General Comments: A&Ox4        General Comments       Exercises Other Exercises Other Exercises: 5x Sit-to-Stand: 14.67s   Assessment/Plan    PT Assessment Patient needs continued PT services  PT Problem List Decreased strength;Decreased activity tolerance;Decreased balance;Decreased mobility       PT Treatment Interventions DME instruction;Gait training;Stair training;Functional mobility training;Therapeutic activities;Therapeutic exercise;Balance training;Patient/family education    PT Goals (Current goals can be found in the Care Plan section)  Acute Rehab PT Goals Patient Stated Goal: to go home PT Goal Formulation: With patient Time For Goal Achievement: 05/23/22 Potential to Achieve Goals: Good    Frequency Min 2X/week     Co-evaluation               AM-PAC PT "6 Clicks" Mobility  Outcome Measure Help needed turning from your back to your side while in a flat bed without using bedrails?: None Help needed moving from lying on your back to sitting on the side of a flat bed without using bedrails?: None Help needed moving to and from a bed to a chair (including a wheelchair)?: A Little Help needed standing up from a chair using your arms (e.g., wheelchair or bedside chair)?: A Little Help needed to walk in hospital room?: A Little Help needed climbing 3-5 steps with a railing? : A Little 6 Click  Score: 20    End of Session Equipment Utilized During Treatment: Gait belt Activity Tolerance: Patient tolerated treatment well Patient left: in chair;with call bell/phone within reach Nurse Communication: Mobility status PT Visit Diagnosis: Unsteadiness on feet (R26.81);History of falling (Z91.81)    Time: 0722-5750 PT Time Calculation (min) (ACUTE ONLY): 23 min   Charges:              Shauna Bodkins, SPT  Mella Inclan 05/09/2022, 11:20 AM

## 2022-05-09 NOTE — Plan of Care (Signed)
Nutrition Education Note  RD consulted for nutrition education regarding CHF.  Spoke with pt at bedside, who reports good appetite. PTA she shares she snacks on fruits and peanut butter, but also eats one large meal per day (meat, starch, and vegetable). Pt admits to eating out 3-4 times per week at [employer] and fast food.   Pt able to recall conversation with Heart Failure RN and acknowledges need to reduce sodium in her diet. RD reviewed ways to decrease sodium in diet as well as sodium free seasonings. Also reviewed need for daily weights and fluid restrictions.   RD provided "Low Sodium Nutrition Therapy" handout from the Academy of [employer] and Dietetics. Reviewed patient's dietary recall. Provided examples on ways to decrease sodium intake in diet. Discouraged intake of processed foods and use of salt shaker. Encouraged fresh fruits and vegetables as well as whole grain sources of carbohydrates to maximize fiber intake.   RD discussed why it is important for patient to adhere to diet recommendations, and emphasized the role of fluids, foods to avoid, and importance of weighing self daily. Teach back method used.  Expect fair to good compliance.  Current diet order is Heart Healthy (liberalized to 2 gram sodium), patient is consuming approximately 100% of meals at this time. Labs and medications reviewed. No further nutrition interventions warranted at this time. RD contact information provided. If additional nutrition issues arise, please re-consult RD.   Loistine Chance, RD, LDN, [place] Registered Dietitian II Certified Diabetes Care and Education Specialist Please refer to [facility] for RD and/or RD on-call/weekend/after hours pager

## 2022-05-09 NOTE — Progress Notes (Signed)
Progress Note  Patient Name: Melanie Ray Date of Encounter: 05/09/2022  Primary Cardiologist: Norman Primary Electrophysiologist:  Quentin Ore  Subjective   Dyspnea continues to improve. No chest pain or palpitations. Lower extremity swelling resolved. Documented UOP 1.5 L for the past 24 hours, net - 3.8 L for the admission. Uncertain weight accuracy.   Inpatient Medications    Scheduled Meds:  atorvastatin  80 mg Oral Daily   carvedilol  12.5 mg Oral BID WC   clopidogrel  75 mg Oral Daily   furosemide  40 mg Intravenous BID   heparin  5,000 Units Subcutaneous Q8H   insulin aspart  0-15 Units Subcutaneous TID WC   insulin aspart  0-5 Units Subcutaneous QHS   irbesartan  75 mg Oral Daily   sodium chloride flush  3 mL Intravenous Q12H   Continuous Infusions:  sodium chloride     PRN Meds: sodium chloride, acetaminophen, sodium chloride flush   Vital Signs    Vitals:   05/08/22 2000 05/08/22 2308 05/09/22 0357 05/09/22 0715  BP:  (!) 173/61 (!) 173/72 (!) 147/75  Pulse:  76 66 81  Resp:  '16 16 14  '$ Temp:  97.8 F (36.6 C)  98.5 F (36.9 C)  TempSrc:  Oral  Oral  SpO2:  100% 100% 97%  Weight: 55.3 kg  57.7 kg   Height: '5\' 3"'$  (1.6 m)       Intake/Output Summary (Last 24 hours) at 05/09/2022 0906 Last data filed at 05/09/2022 0357 Gross per 24 hour  Intake 240 ml  Output 900 ml  Net -660 ml   Filed Weights   05/07/22 1841 05/08/22 2000 05/09/22 0357  Weight: 61.2 kg 55.3 kg 57.7 kg    Telemetry    AV paced - Personally Reviewed  ECG    No new tracings - Personally Reviewed  Physical Exam   GEN: No acute distress.   Neck: JVD elevated ~ 8 cm. Cardiac: RRR, no murmurs, rubs, or gallops.  Respiratory: Clear to auscultation bilaterally.  GI: Soft, nontender, non-distended.   MS: No edema; No deformity. Neuro:  Alert and oriented x 3; Nonfocal.  Psych: Normal affect.  Labs    Chemistry Recent Labs  Lab 05/07/22 1610 05/07/22 1939  05/09/22 0649  NA 142 142 141  K 4.3 4.0 3.3*  CL 112* 115* 108  CO2 23 21* 26  GLUCOSE 149* 103* 145*  BUN '23 22 22  '$ CREATININE 1.10* 0.88 1.10*  CALCIUM 9.4 8.8* 9.5  GFRNONAA 53* >60 53*  ANIONGAP '7 6 7     '$ Hematology Recent Labs  Lab 05/07/22 1610 05/07/22 1843  WBC 9.4 8.0  RBC 4.97 4.64  HGB 13.0 12.4  HCT 41.5 39.1  MCV 83.5 84.3  MCH 26.2 26.7  MCHC 31.3 31.7  RDW 15.4 15.8*  PLT 325 263    Cardiac EnzymesNo results for input(s): "TROPONINI" in the last 168 hours. No results for input(s): "TROPIPOC" in the last 168 hours.   BNP Recent Labs  Lab 05/07/22 1610 05/07/22 1853  BNP >4,500.0* 2,597.3*     DDimer No results for input(s): "DDIMER" in the last 168 hours.   Radiology    CT CHEST WO CONTRAST  Result Date: 05/07/2022 IMPRESSION: Bilateral pleural effusions with basilar atelectatic changes. Cardiomegaly. Aortic Atherosclerosis (ICD10-I70.0). Electronically Signed   By: Inez Catalina M.D.   On: 05/07/2022 22:29   DG Chest 1 View  Result Date: 05/07/2022 IMPRESSION: Mild cardiomegaly and bibasilar atelectasis. Electronically  Signed   By: Ulyses Jarred M.D.   On: 05/07/2022 19:31    Cardiac Studies   2D echo 05/08/2022: 1. Left ventricular ejection fraction, by estimation, is 20 to 25%. The  left ventricle has severely decreased function. The left ventricle  demonstrates global hypokinesis. The left ventricular internal cavity size  was mildly dilated. Left ventricular  diastolic parameters are consistent with Grade I diastolic dysfunction  (impaired relaxation).   2. Right ventricular systolic function is normal. The right ventricular  size is normal. There is mildly elevated pulmonary artery systolic  pressure. The estimated right ventricular systolic pressure is 13.2 mmHg.   3. Left atrial size was moderately dilated.   4. The mitral valve is normal in structure. Moderate mitral valve  regurgitation. No evidence of mitral stenosis.   5. The  aortic valve was not well visualized. Aortic valve regurgitation  is mild. No aortic stenosis is present.   6. The inferior vena cava is normal in size with <50% respiratory  variability, suggesting right atrial pressure of 8 mmHg. __________  2D echo 04/11/2022: 1. Left ventricular ejection fraction, by estimation, is 25 to 30%. The  left ventricle has severely decreased function. The left ventricle  demonstrates global hypokinesis. The left ventricular internal cavity size  was mildly dilated. Left ventricular  diastolic parameters are consistent with Grade II diastolic dysfunction  (pseudonormalization). The average left ventricular global longitudinal  strain is -8.5 %. The global longitudinal strain is abnormal.   2. Right ventricular systolic function is normal. The right ventricular  size is normal. There is normal pulmonary artery systolic pressure.   3. Left atrial size was mild to moderately dilated.   4. The mitral valve is normal in structure. Mild to moderate mitral valve  regurgitation.   5. The aortic valve is tricuspid. Aortic valve regurgitation is mild.   6. The inferior vena cava is normal in size with greater than 50%  respiratory variability, suggesting right atrial pressure of 3 mmHg. __________   Cardiac MRI 11/08/2021: IMPRESSION: 1. Moderately dilated LV with diffuse hypokinesis and septal-lateral dyssynchrony consistent with LBBB. EF 17%. 2.  Normal RV size and systolic function, EF 44%. 3. At least moderate MR, likely functional with posterior leaflet restriction. Unable to quantify as flow sequences not done. 4.  No significant LGE noted.   With lack of LGE, LBBB cardiomyopathy may be a major contributor to low EF. __________   [facility] 05/16/2020: The left ventricular ejection fraction is severely decreased (<30%). Nuclear stress EF: 20%. There was no ST segment deviation noted during stress. This is a high risk study.   High risk study due to  severely decreased left ventricular systolic function. No ischemia is seen, although "balanced ischemia" cannot be entirely excluded. There is profound systolic LBBB-related dyssynchrony. 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 __________   2D echo 03/31/2020: 1. Left ventricular ejection fraction, by estimation, is <20%. The left  ventricle has severely decreased function. The left ventricle demonstrates  global hypokinesis. The left ventricular internal cavity size was severely  dilated. There is moderate left   ventricular hypertrophy. Left ventricular diastolic function could not be  evaluated.   2. Right ventricular systolic function is normal. The right ventricular  size is normal. There is mildly elevated pulmonary artery systolic  pressure.   3. Left atrial size was moderately dilated.   4. The mitral valve is normal in structure. Moderate mitral valve  regurgitation.   5. The aortic valve is normal in structure. Aortic valve regurgitation is  trivial.   6. Agitated saline contrast bubble study was negative, with no evidence  of any interatrial shunt. __________   2D echo 06/30/2018: - Left ventricle: The cavity size was normal. There was mild    concentric hypertrophy. Systolic function was moderately to    severely reduced. The estimated ejection fraction was in the    range of 30% to 35%. Hypokinesis of the basal-midanteroseptal,    inferior, and inferoseptal myocardium. Doppler parameters are    consistent with abnormal left ventricular relaxation (grade 1    diastolic dysfunction).  - Aortic valve: Transvalvular velocity was within the normal range.    There was no stenosis. There was mild to moderate regurgitation.  - Mitral valve: Calcified annulus. Mobile density (2cm x 0.5 cm) on    the atrial side of the posterior leaflet of the mitral valve.    Transvalvular velocity was within the  normal range. There was no    evidence for stenosis. There was mild regurgitation.  - Right ventricle: The cavity size was normal. Wall thickness was    normal. Systolic function was normal.  - Tricuspid valve: There was mild regurgitation.  - Pulmonary arteries: Systolic pressure was within the normal    range. PA peak pressure: 39 mm Hg (S).   Impressions:   - Compared with the echo 01/2017, the mobile mitral valve density is    unchanged. __________   2D echo 01/30/2017: - Left ventricle: Diffuse hypokinesis with abnormal septal motion    and marked dysnergy between septum and lateral wall. The cavity    size was moderately dilated. There was mild focal basal    hypertrophy of the septum. Systolic function was moderately to    severely reduced. The estimated ejection fraction was in the    range of 30% to 35%. Doppler parameters are consistent with both    elevated ventricular end-diastolic filling pressure and elevated    left atrial filling pressure.  - Aortic valve: There was mild regurgitation.  - Mitral valve: Calcified annulus. Mildly thickened leaflets .    There was mild regurgitation.  - Atrial septum: No defect or patent foramen ovale was identified. __________   2D echo 06/08/2016: - Left ventricle: The cavity size was normal. There was mild focal    basal hypertrophy of the septum. Systolic function was severely    reduced. The estimated ejection fraction was in the range of 25%    to 30%. Diffuse hypokinesis. There is akinesis of the    mid-apicalanteroseptal myocardium. Doppler parameters are    consistent with abnormal left ventricular relaxation (grade 1    diastolic dysfunction).  - Ventricular septum: Septal motion showed abnormal function and    dyssynergy.  - Aortic valve: Trileaflet; mildly thickened, mildly calcified    leaflets. There was mild regurgitation.  - Mitral valve: Moderately calcified annulus. Mildly thickened    leaflets . There was mild  regurgitation.  - Pulmonary arteries: Systolic pressure was mildly increased. PA    peak pressure: 37 mm Hg (S).   Impressions:   - Compared to the prior study, there has been no significant    interval change. __________   2D echo 03/18/2016: - Left ventricle: The cavity size was normal. Wall thickness was    normal. Systolic function was moderately to severely reduced. The    estimated ejection fraction was in the range of 30%  to 35%.    Akinesis of the entireanteroseptal myocardium; consistent with    infarction. Doppler parameters are consistent with abnormal left    ventricular relaxation (grade 1 diastolic dysfunction).  - Mitral valve: Mildly calcified annulus. There was mild    regurgitation.  - Left atrium: The atrium was severely dilated.  __________   [facility] 03/17/2016: LPDA lesion, 30% stenosed. Ost 1st Diag to 1st Diag lesion, 60% stenosed. Dist LAD lesion, 50% stenosed. Prox LAD lesion, 80% stenosed. Post intervention, there is a 0% residual stenosis. Mid LAD lesion, 80% stenosed. Post intervention, there is a 0% residual stenosis. There is mild left ventricular systolic dysfunction.   1. Acute anterolateral ST segment elevation MI with successful stenting of the proximal and mid LAD   2. Widely patent, dominant left circumflex   3. Patent, nondominant RCA   4. Proximal LAD dissection treated successfully with primary stenting   5. Moderate segmental LV systolic dysfunction with LVEF estimated at 45%   The patient was treated with heparin and Aggrastat single bolus in the cardiac catheterization lab. Will load her with brilinta on arrival to the CCU. Would treat with aspirin and brilinta for at least 12 months. Initiate post MI medical therapy.  Patient Profile     71 y.o. female with history of CAD with anterior ST elevation MI in 03/2016 status post PCI/DES x2 to the LAD, HFrEF secondary to ICM status post CRT-D in 12/2021, cerebellar CVA in 03/2020, bilateral  vertebral artery stenosis, LBBB, DM2, HTN, HLD, obsessive-compulsive disorder, hepatic steatosis, and OSA who is being seen today for the evaluation of acute on chronic HFrEF at the request of Dr. Marcello Moores.  Assessment & Plan    1. Acute on chronic HFrEF secondary to ICM:  -She was admitted with a 3-week history of increased dyspnea and lower extremity swelling.  Likely in the setting of medication and dietary adherence -She does continue to be volume up, though this is improving -Continue IV Lasix 40 mg bid (she does want to be discharged as her dog needs insulin and her husband at home has dementia. She indicates there is no one else that can administer the dog's medication) -Echo this admission with stable LVSF -Recent echo obtained less than 1 month ago demonstrated improvement in LV systolic function with an EF of 30% (previously 17% by cardiac MRI) -Escalation of GDMT has been limited by financial constraints, AKI, and tolerance -She has not been able to be maintained on Entresto secondary to cost and intolerance with noted shortness of breath -She has been off spironolactone secondary to renal dysfunction -She has been off SGLT2 inhibitor secondary to cost and has been reluctant to retry -Continue PTA carvedilol and valsartan (irbesartan in place of valsartan during admission secondary to formula)  2. CAD involving the native coronary arteries with elevated high-sensitivity troponin:  -Never with frank chest pain -Minimally elevated and flat trending high-sensitivity troponin, not consistent with ACS -No indication for heparin drip -Likely supply demand ischemia in the setting of volume overload and hypertension -PTA carvedilol, clopidogrel, and atorvastatin -No plans for inpatient ischemic evaluation at this time  3. HTN:  -Blood pressure improving -Continue medical therapy as above  4. LBBB:  -Status post CRT-D in 12/2021   5. Mitral regurgitation:  -Stable and moderate on most  recent echo  -Monitor status post CRT  -Outpatient follow up   6.  Obsessive-compulsive disorder: -Appears stable      For questions or updates, please contact Dane  Please consult [email or web address] for contact info under Cardiology/STEMI.    Signed, Christell Faith, PA-C [place] Pager: [phone] 05/09/2022, 9:06 AM

## 2022-05-09 NOTE — Discharge Summary (Signed)
Physician Discharge Summary   Patient: Melanie Ray MRN: [id number]  DOB: Jun 20, 1951   Admit:     Date of Admission: 05/07/2022 Admitted from: home   Discharge: Date of discharge: 05/09/22 Disposition: Home health Condition at discharge: good  CODE STATUS: FULL    Discharge Physician: Emeterio Reeve, DO [facility]     PCP: Leone Haven, MD  Recommendations for Outpatient Follow-up:  Follow up with PCP Leone Haven, MD in 1-2 weeks Please obtain labs/tests: CBC, BMP in 1-2 weeks Please follow up on the following pending results: none Please follow up w/ cardiology in 2-4 weeks    Discharge Instructions     Diet - low sodium heart healthy   Complete by: As directed    Discharge instructions   Complete by: As directed    See medication list for Lasix (furosemide) instructions.  Please schedule follow up with your PCP and/or cardiologist in the next couple weeks.   Increase activity slowly   Complete by: As directed          Brief Narrative / Hospital Course:  71 y.o. female with medical history significant of history of CAD s/p MI s/pstents to LAD, CHFref ef 30-35%, DMII diet controlled s/p losing weight, HTN, HLD, LBBB, OSA  noncompliant with cpap, CVA no residual weakness, s/p Pacemaker placement 2/23,  recent history of acute renal insufficiency 3 mo ago at which time patient was taken of lasix.  Patient to ED 05/07/2022 with 2-3 weeks sob and lower extremity swelling, worsening over last week, had been d/c diuretics d/t renal fxn, not compliant w/ low salt or fluid restriction.  07/03-07/04: diuresed, cardiology consulted. No plans for ischemic w/u. 2D Echo 07/04: EF 20-25% w/ LV severe decreased function and global hypokinesis, Grade 1 diastolic df.  31/54: pt asking for discharge today. Cardiology notes still fluid overloaded though improved. Recommend continue home carvedilol, valsartan. Will need to be on continued home diuresis.  Arlyce Harman precluded by renal fxn, Entresto precluded d/t cost. CAD: continue carvedilol, plavix, statin.   Consultants:  Cardiology   Procedures: none    Subjective: Patient reports doing well, feeling great, would like to go home today. No CP/SOB      Discharge Diagnoses: Principal Problem:   Acute on chronic systolic CHF (congestive heart failure) (HCC) Active Problems:   Essential hypertension   HLD (hyperlipidemia)   Anxiety   DM type 2 (diabetes mellitus, type 2) (HCC)    Acute on chronic heart failure with reduced ejection fraction Acute hypoxic respiratory failure secondary to above Bilateral pleural effusion Patient was recently instructed to stop taking her Lasix by her primary care --> restarted at lower dose w/ prn insturctions, see med rec     Type 2 diabetes mellitus without complication Patient endorses a eating out a lot Hemoglobin A1c 5.5, nondiabetic range   Coronary disease status post PCI No chest pain Resume Coreg, Lipitor, Diovan, Plavix   Essential hypertension Blood pressure controlled PTA Coreg, valsartan   Moderate mitral regurgitation IV Lasix --> po on discharge    Hyperlipidemia PTA statin   Obstructive sleep apnea Oxygen as necessary Recommend outpatient evaluation for sleep study.  Patient does not use CPAP at home   History of CVA No residual deficits Antiplatelet therapy as above            Discharge Instructions  Allergies as of 05/09/2022       Reactions   Tetracycline Swelling   Meperidine Nausea  And Vomiting   Nausea   Entresto [sacubitril-valsartan] Other (See Comments)   Shortness of Breath        Medication List     TAKE these medications    atorvastatin 80 MG tablet Commonly known as: LIPITOR Take 1 tablet by mouth once daily   carvedilol 12.5 MG tablet Commonly known as: COREG TAKE 1 TABLET BY MOUTH TWICE DAILY WITH MEALS   clopidogrel 75 MG tablet Commonly known as: PLAVIX Take 1  tablet by mouth once daily   dorzolamide-timolol 22.3-6.8 MG/ML ophthalmic solution Commonly known as: COSOPT Place 1 drop into both eyes 2 (two) times daily.   furosemide 20 MG tablet Commonly known as: LASIX Take 0.5 tablets (10 mg total) by mouth daily. OK to take 1 tablet (20 mg total) by mouth daily as needed for increased leg swelling, shortness of breath, 3-5 lb weight gain in 2 days. What changed:  how much to take additional instructions   isosorbide mononitrate 30 MG 24 hr tablet Commonly known as: IMDUR Take 1 tablet (30 mg total) by mouth daily.   latanoprost 0.005 % ophthalmic solution Commonly known as: XALATAN Place 1 drop into both eyes at bedtime.   LORazepam 0.5 MG tablet Commonly known as: ATIVAN Take 0.5 mg by mouth daily as needed for anxiety.   lurasidone 40 MG Tabs tablet Commonly known as: LATUDA Take 40 mg by mouth daily with breakfast.   nitroGLYCERIN 0.4 MG SL tablet Commonly known as: NITROSTAT DISSOLVE ONE TABLET UNDER THE TONGUE EVERY FIVE MINUTES AS NEEDED FOR CHEST PAIN. DO NOT EXCEED A TOTAL OF THREE DOSES IN 15 MINUTES   traZODone 150 MG tablet Commonly known as: DESYREL Take 150 mg by mouth at bedtime.   valsartan 80 MG tablet Commonly known as: DIOVAN Take 80 mg by mouth daily.   venlafaxine XR 150 MG 24 hr capsule Commonly known as: EFFEXOR-XR TAKE 2 CAPSULES BY MOUTH ONCE DAILY (NEEDS to follow-up with psychiatry for further refills)          Allergies  Allergen Reactions   Tetracycline Swelling   Meperidine Nausea And Vomiting    Nausea   Entresto [Sacubitril-Valsartan] Other (See Comments)    Shortness of Breath     Discharge Exam: Vitals:   05/09/22 0715 05/09/22 1135  BP: (!) 147/75 (!) 143/71  Pulse: 81 78  Resp: 14 16  Temp: 98.5 F (36.9 C) 98.2 F (36.8 C)  SpO2: 97% 96%   Vitals:   05/08/22 2308 05/09/22 0357 05/09/22 0715 05/09/22 1135  BP: (!) 173/61 (!) 173/72 (!) 147/75 (!) 143/71  Pulse: 76  66 81 78  Resp: '16 16 14 16  '$ Temp: 97.8 F (36.6 C)  98.5 F (36.9 C) 98.2 F (36.8 C)  TempSrc: Oral  Oral   SpO2: 100% 100% 97% 96%  Weight:  57.7 kg    Height:         General: Pt is alert, awake, not in acute distress Cardiovascular: RRR, S1/S2 +, no rubs, no gallops Respiratory: CTA bilaterally, no wheezing, no rhonchi Abdominal: Soft, NT, ND, bowel sounds + Extremities: no edema, no cyanosis     The results of significant diagnostics from this hospitalization (including imaging, microbiology, ancillary and laboratory) are listed below for reference.     Microbiology: No results found for this or any previous visit (from the past 240 hour(s)).   Labs: BNP (last 3 results) Recent Labs    11/15/21 1504 05/07/22 1610 05/07/22 1853  BNP  980.3* >4,500.0* 3,710.6*   Basic Metabolic Panel: Recent Labs  Lab 05/07/22 1610 05/07/22 1939 05/09/22 0649  NA 142 142 141  K 4.3 4.0 3.3*  CL 112* 115* 108  CO2 23 21* 26  GLUCOSE 149* 103* 145*  BUN '23 22 22  '$ CREATININE 1.10* 0.88 1.10*  CALCIUM 9.4 8.8* 9.5   Liver Function Tests: No results for input(s): "AST", "ALT", "ALKPHOS", "BILITOT", "PROT", "ALBUMIN" in the last 168 hours. No results for input(s): "LIPASE", "AMYLASE" in the last 168 hours. No results for input(s): "AMMONIA" in the last 168 hours. CBC: Recent Labs  Lab 05/07/22 1610 05/07/22 1843  WBC 9.4 8.0  HGB 13.0 12.4  HCT 41.5 39.1  MCV 83.5 84.3  PLT 325 263   Cardiac Enzymes: No results for input(s): "CKTOTAL", "CKMB", "CKMBINDEX", "TROPONINI" in the last 168 hours. BNP: Invalid input(s): "POCBNP" CBG: Recent Labs  Lab 05/08/22 0801 05/08/22 1728 05/08/22 2041  GLUCAP 108* 150* 102*   D-Dimer No results for input(s): "DDIMER" in the last 72 hours. Hgb A1c Recent Labs    05/07/22 1610  HGBA1C 5.5   Lipid Profile No results for input(s): "CHOL", "HDL", "LDLCALC", "TRIG", "CHOLHDL", "LDLDIRECT" in the last 72 hours. Thyroid  function studies No results for input(s): "TSH", "T4TOTAL", "T3FREE", "THYROIDAB" in the last 72 hours.  Invalid input(s): "FREET3" Anemia work up No results for input(s): "VITAMINB12", "FOLATE", "FERRITIN", "TIBC", "IRON", "RETICCTPCT" in the last 72 hours. Urinalysis    Component Value Date/Time   COLORURINE YELLOW (A) 11/22/2016 1138   APPEARANCEUR CLEAR (A) 11/22/2016 1138   LABSPEC 1.019 11/22/2016 1138   PHURINE 5.0 11/22/2016 1138   GLUCOSEU NEGATIVE 11/22/2016 1138   HGBUR NEGATIVE 11/22/2016 1138   BILIRUBINUR NEGATIVE 11/22/2016 1138   KETONESUR NEGATIVE 11/22/2016 1138   PROTEINUR NEGATIVE 11/22/2016 1138   NITRITE NEGATIVE 11/22/2016 1138   LEUKOCYTESUR NEGATIVE 11/22/2016 1138   Sepsis Labs Recent Labs  Lab 05/07/22 1610 05/07/22 1843  WBC 9.4 8.0   Microbiology No results found for this or any previous visit (from the past 240 hour(s)). Imaging ECHOCARDIOGRAM COMPLETE  Result Date: 05/08/2022    ECHOCARDIOGRAM REPORT   Patient Name:   [facility] Date of Exam: 05/08/2022 Medical Rec #:  [id number]             Height:       63.0 in Accession #:    [id number]            Weight:       135.0 lb Date of Birth:  Jun 30, 1951             BSA:          1.636 m Patient Age:    71 years              BP:           147/60 mmHg Patient Gender: F                     HR:           74 bpm. Exam Location:  [facility] Procedure: 2D Echo Indications:     CHF I50.21  History:         Patient has prior history of Echocardiogram examinations, most                  recent 04/11/2022.  Sonographer:     Kathlen Brunswick RDCS Referring Phys:  [id number] SARA-MAIZ A THOMAS Diagnosing Phys:  Ida Rogue MD IMPRESSIONS  1. Left ventricular ejection fraction, by estimation, is 20 to 25%. The left ventricle has severely decreased function. The left ventricle demonstrates global hypokinesis. The left ventricular internal cavity size was mildly dilated. Left ventricular diastolic parameters are consistent  with Grade I diastolic dysfunction (impaired relaxation).  2. Right ventricular systolic function is normal. The right ventricular size is normal. There is mildly elevated pulmonary artery systolic pressure. The estimated right ventricular systolic pressure is 93.2 mmHg.  3. Left atrial size was moderately dilated.  4. The mitral valve is normal in structure. Moderate mitral valve regurgitation. No evidence of mitral stenosis.  5. The aortic valve was not well visualized. Aortic valve regurgitation is mild. No aortic stenosis is present.  6. The inferior vena cava is normal in size with <50% respiratory variability, suggesting right atrial pressure of 8 mmHg. FINDINGS  Left Ventricle: Left ventricular ejection fraction, by estimation, is 20 to 25%. The left ventricle has severely decreased function. The left ventricle demonstrates global hypokinesis. The left ventricular internal cavity size was mildly dilated. There is no left ventricular hypertrophy. Left ventricular diastolic parameters are consistent with Grade I diastolic dysfunction (impaired relaxation). Right Ventricle: The right ventricular size is normal. No increase in right ventricular wall thickness. Right ventricular systolic function is normal. There is mildly elevated pulmonary artery systolic pressure. The tricuspid regurgitant velocity is 2.59  m/s, and with an assumed right atrial pressure of 8 mmHg, the estimated right ventricular systolic pressure is 35.5 mmHg. Left Atrium: Left atrial size was moderately dilated. Right Atrium: Right atrial size was normal in size. Pericardium: There is no evidence of pericardial effusion. Mitral Valve: The mitral valve is normal in structure. Moderate mitral valve regurgitation. No evidence of mitral valve stenosis. Tricuspid Valve: The tricuspid valve is normal in structure. Tricuspid valve regurgitation is not demonstrated. No evidence of tricuspid stenosis. Aortic Valve: The aortic valve was not well  visualized. Aortic valve regurgitation is mild. Aortic regurgitation PHT measures 466 msec. No aortic stenosis is present. Aortic valve peak gradient measures 9.5 mmHg. Pulmonic Valve: The pulmonic valve was normal in structure. Pulmonic valve regurgitation is not visualized. No evidence of pulmonic stenosis. Aorta: The aortic root is normal in size and structure. Venous: The inferior vena cava is normal in size with less than 50% respiratory variability, suggesting right atrial pressure of 8 mmHg. IAS/Shunts: No atrial level shunt detected by color flow Doppler. Additional Comments: A device lead is visualized.  LEFT VENTRICLE PLAX 2D LVIDd:         5.67 cm      Diastology LVIDs:         5.20 cm      LV e' medial:    4.46 cm/s LV PW:         1.09 cm      LV E/e' medial:  19.1 LV IVS:        1.09 cm      LV e' lateral:   7.51 cm/s LVOT diam:     1.90 cm      LV E/e' lateral: 11.4 LV SV:         25 LV SV Index:   15 LVOT Area:     2.84 cm                              3D Volume EF: LV Volumes (MOD)  3D EF:        25 % LV vol d, MOD A2C: 158.0 ml LV EDV:       224 ml LV vol d, MOD A4C: 204.0 ml LV ESV:       167 ml LV vol s, MOD A2C: 125.0 ml LV SV:        57 ml LV vol s, MOD A4C: 137.0 ml LV SV MOD A2C:     33.0 ml LV SV MOD A4C:     204.0 ml LV SV MOD BP:      48.1 ml RIGHT VENTRICLE RV Basal diam:  2.83 cm RV S prime:     15.90 cm/s TAPSE (M-mode): 2.0 cm LEFT ATRIUM              Index LA diam:        4.90 cm  2.99 cm/m LA Vol (A2C):   115.0 ml 70.29 ml/m LA Vol (A4C):   92.9 ml  56.78 ml/m LA Biplane Vol: 107.0 ml 65.40 ml/m  AORTIC VALVE                 PULMONIC VALVE AV Area (Vmax): 0.91 cm     PV Vmax:       1.32 m/s AV Vmax:        154.00 cm/s  PV Peak grad:  7.0 mmHg AV Peak Grad:   9.5 mmHg LVOT Vmax:      49.60 cm/s LVOT Vmean:     31.100 cm/s LVOT VTI:       0.087 m AI PHT:         466 msec  AORTA Ao Root diam: 2.70 cm MITRAL VALVE                TRICUSPID VALVE MV Area (PHT): 2.67 cm      TV Peak grad:   22.0 mmHg MV Decel Time: 284 msec     TV Vmax:        2.34 m/s MV E velocity: 85.30 cm/s   TR Peak grad:   26.8 mmHg MV A velocity: 106.00 cm/s  TR Vmax:        259.00 cm/s MV E/A ratio:  0.80                             SHUNTS                             Systemic VTI:  0.09 m                             Systemic Diam: 1.90 cm Ida Rogue MD Electronically signed by Ida Rogue MD Signature Date/Time: 05/08/2022/12:41:26 PM    Final (Updated)       Time coordinating discharge: Over 30 minutes  SIGNED:  Emeterio Reeve DO [facility]

## 2022-05-10 ENCOUNTER — Telehealth: Payer: Self-pay

## 2022-05-10 NOTE — Telephone Encounter (Signed)
Transition Care Management Follow-up Telephone Call Date of discharge and from where: 05/09/22 [facility]. 2 day face to face discharge qualifies TCM.  Nurse called and spoke with patient who notes she is fine, no new or worsening symptoms presenting. Patient was busy and could not complete this phone call at the time. No additional follow up calls to be made by nurse on said topic. Patient confirmed appointment with PCP 05/11/22 @ 11:00. Keep all scheduled appointments.

## 2022-05-11 ENCOUNTER — Encounter: Payer: Self-pay | Admitting: Family Medicine

## 2022-05-11 ENCOUNTER — Ambulatory Visit (INDEPENDENT_AMBULATORY_CARE_PROVIDER_SITE_OTHER): Payer: Medicare Other | Admitting: Family Medicine

## 2022-05-11 DIAGNOSIS — I255 Ischemic cardiomyopathy: Secondary | ICD-10-CM

## 2022-05-11 DIAGNOSIS — H409 Unspecified glaucoma: Secondary | ICD-10-CM | POA: Diagnosis not present

## 2022-05-11 DIAGNOSIS — Z9181 History of falling: Secondary | ICD-10-CM | POA: Diagnosis not present

## 2022-05-11 DIAGNOSIS — E119 Type 2 diabetes mellitus without complications: Secondary | ICD-10-CM

## 2022-05-11 DIAGNOSIS — I5022 Chronic systolic (congestive) heart failure: Secondary | ICD-10-CM | POA: Diagnosis not present

## 2022-05-11 DIAGNOSIS — Z7902 Long term (current) use of antithrombotics/antiplatelets: Secondary | ICD-10-CM | POA: Diagnosis not present

## 2022-05-11 DIAGNOSIS — Z91119 Patient's noncompliance with dietary regimen due to unspecified reason: Secondary | ICD-10-CM | POA: Diagnosis not present

## 2022-05-11 DIAGNOSIS — I5023 Acute on chronic systolic (congestive) heart failure: Secondary | ICD-10-CM | POA: Diagnosis not present

## 2022-05-11 DIAGNOSIS — I2510 Atherosclerotic heart disease of native coronary artery without angina pectoris: Secondary | ICD-10-CM | POA: Diagnosis not present

## 2022-05-11 DIAGNOSIS — I110 Hypertensive heart disease with heart failure: Secondary | ICD-10-CM | POA: Diagnosis not present

## 2022-05-11 DIAGNOSIS — F419 Anxiety disorder, unspecified: Secondary | ICD-10-CM | POA: Diagnosis not present

## 2022-05-11 DIAGNOSIS — Z87891 Personal history of nicotine dependence: Secondary | ICD-10-CM | POA: Diagnosis not present

## 2022-05-11 DIAGNOSIS — I447 Left bundle-branch block, unspecified: Secondary | ICD-10-CM | POA: Diagnosis not present

## 2022-05-11 DIAGNOSIS — G4733 Obstructive sleep apnea (adult) (pediatric): Secondary | ICD-10-CM | POA: Diagnosis not present

## 2022-05-11 DIAGNOSIS — R21 Rash and other nonspecific skin eruption: Secondary | ICD-10-CM

## 2022-05-11 DIAGNOSIS — M109 Gout, unspecified: Secondary | ICD-10-CM | POA: Diagnosis not present

## 2022-05-11 DIAGNOSIS — E785 Hyperlipidemia, unspecified: Secondary | ICD-10-CM | POA: Diagnosis not present

## 2022-05-11 DIAGNOSIS — I252 Old myocardial infarction: Secondary | ICD-10-CM | POA: Diagnosis not present

## 2022-05-11 DIAGNOSIS — I5030 Unspecified diastolic (congestive) heart failure: Secondary | ICD-10-CM | POA: Diagnosis not present

## 2022-05-11 DIAGNOSIS — Z8673 Personal history of transient ischemic attack (TIA), and cerebral infarction without residual deficits: Secondary | ICD-10-CM | POA: Diagnosis not present

## 2022-05-11 DIAGNOSIS — Z91199 Patient's noncompliance with other medical treatment and regimen due to unspecified reason: Secondary | ICD-10-CM | POA: Diagnosis not present

## 2022-05-11 DIAGNOSIS — F32A Depression, unspecified: Secondary | ICD-10-CM | POA: Diagnosis not present

## 2022-05-11 DIAGNOSIS — I080 Rheumatic disorders of both mitral and aortic valves: Secondary | ICD-10-CM | POA: Diagnosis not present

## 2022-05-11 NOTE — Assessment & Plan Note (Signed)
Possibly related to folliculitis or some other undetermined cause of her rash.  She will trial over-the-counter antibiotic ointment and if not beneficial she will let us know and we can refer her to dermatology.

## 2022-05-11 NOTE — Progress Notes (Signed)
Tommi Rumps, MD Phone: [phone]  Melanie Ray is a 71 y.o. female who presents today for follow-up.  CHF exacerbation: Patient was admitted to the hospital for a CHF exacerbation.  She was treated with IV Lasix.  She was transitioned to oral Lasix and discharged.  She notes no dyspnea, orthopnea, or PND since discharge.  She notes her swelling has significantly improved.  She is a little unclear on what Lasix dose she is supposed to take.  She is weighing herself at home and notes she was 123 pounds yesterday and then 130 pounds today.  She has been limiting her water intake to less than 64 ounces daily.  She is working on reducing salt intake in her diet.  She had a CT scan of her chest that revealed pleural effusions.  Her echo revealed an EF of 20-25%.  She had severely reduced function and global hypokinesis.  She also had grade 1 diastolic dysfunction.  Rash: Patient notes getting occasional red bumps over the last month or so.  They come on and last for couple of days and then go away on their own.  There is no itching.  She has had no changes to soaps, detergents, or medications prior to the onset of rash.  Chest nodule: Patient notes there is a knot at the base of her sternum between her breasts.  She notes its been present for a few months.  There is no pain, itching, or drainage.  Social History   Tobacco Use  Smoking Status Former   Types: Cigarettes   Quit date: 04/13/1997   Years since quitting: 25.0  Smokeless Tobacco Never    Current Outpatient Medications on File Prior to Visit  Medication Sig Dispense Refill   atorvastatin (LIPITOR) 80 MG tablet Take 1 tablet by mouth once daily 90 tablet 3   carvedilol (COREG) 12.5 MG tablet TAKE 1 TABLET BY MOUTH TWICE DAILY WITH MEALS 60 tablet 0   clopidogrel (PLAVIX) 75 MG tablet Take 1 tablet by mouth once daily 90 tablet 0   dorzolamide-timolol (COSOPT) 22.3-6.8 MG/ML ophthalmic solution Place 1 drop into both eyes 2  (two) times daily.      furosemide (LASIX) 20 MG tablet Take 0.5 tablets (10 mg total) by mouth daily. OK to take 1 tablet (20 mg total) by mouth daily as needed for increased leg swelling, shortness of breath, 3-5 lb weight gain in 2 days. 30 tablet 0   isosorbide mononitrate (IMDUR) 30 MG 24 hr tablet Take 1 tablet (30 mg total) by mouth daily. 90 tablet 3   latanoprost (XALATAN) 0.005 % ophthalmic solution Place 1 drop into both eyes at bedtime.     LORazepam (ATIVAN) 0.5 MG tablet Take 0.5 mg by mouth daily as needed for anxiety.     lurasidone (LATUDA) 40 MG TABS tablet Take 40 mg by mouth daily with breakfast.     nitroGLYCERIN (NITROSTAT) 0.4 MG SL tablet DISSOLVE ONE TABLET UNDER THE TONGUE EVERY FIVE MINUTES AS NEEDED FOR CHEST PAIN. DO NOT EXCEED A TOTAL OF THREE DOSES IN 15 MINUTES 25 tablet 4   traZODone (DESYREL) 150 MG tablet Take 150 mg by mouth at bedtime.     valsartan (DIOVAN) 80 MG tablet Take 80 mg by mouth daily.     venlafaxine XR (EFFEXOR-XR) 150 MG 24 hr capsule TAKE 2 CAPSULES BY MOUTH ONCE DAILY (NEEDS to follow-up with psychiatry for further refills) 60 capsule 0   No current facility-administered medications on file prior  to visit.     ROS see history of present illness  Objective  Physical Exam Vitals:   05/11/22 1112  BP: 110/70  Pulse: 83  Temp: 98.7 F (37.1 C)  SpO2: 98%    BP Readings from Last 3 Encounters:  05/11/22 110/70  05/09/22 (!) 143/71  05/07/22 130/80   Wt Readings from Last 3 Encounters:  05/11/22 129 lb 3.2 oz (58.6 kg)  05/09/22 127 lb 1.6 oz (57.7 kg)  05/07/22 136 lb 6.4 oz (61.9 kg)    Physical Exam Constitutional:      General: She is not in acute distress.    Appearance: She is not diaphoretic.  Cardiovascular:     Rate and Rhythm: Normal rate and regular rhythm.     Heart sounds: Normal heart sounds.  Pulmonary:     Effort: Pulmonary effort is normal.     Breath sounds: Normal breath sounds.  Chest:    Skin:     General: Skin is warm and dry.          Comments: Fulton Mole, CMA served as chaperone for the skin and chest exam  Neurological:     Mental Status: She is alert.      Assessment/Plan: Please see individual problem list.  Problem List Items Addressed This Visit     Chronic systolic CHF (congestive heart failure) (HCC) (Chronic)    Symptoms have significantly improved.  Her weight has trended up at home compared to yesterday.  Discussed she should take 20 mg of Lasix today and reweigh herself tomorrow.  Discussed taking 10 mg of Lasix daily and if she increases in weight 2 or more pounds overnight or more than 5 pounds in a week she would increase to 20 mg that day and would need to let us know.  She will continue to weigh herself daily.  Discussed fluid restriction and salt restriction.  We will plan on checking a BMP next week.      Relevant Orders   Basic Metabolic Panel (BMET)   DM type 2 (diabetes mellitus, type 2) (HCC) (Chronic)    A1c was in the normal range when checked earlier this week.  We will continue to periodically monitor her sugar to determine if it goes back up.      Rash    Possibly related to folliculitis or some other undetermined cause of her rash.  She will trial over-the-counter antibiotic ointment and if not beneficial she will let us know and we can refer her to dermatology.      The area of her concern in her chest represents her costochondral joints.  There is no palpable underlying skin lesion.  I reassured her on this.  Return in about 5 days (around 05/16/2022) for labs, 6 weeks PCP for CHF.   Tommi Rumps, MD [place]

## 2022-05-11 NOTE — Assessment & Plan Note (Signed)
Symptoms have significantly improved.  Her weight has trended up at home compared to yesterday.  Discussed she should take 20 mg of Lasix today and reweigh herself tomorrow.  Discussed taking 10 mg of Lasix daily and if she increases in weight 2 or more pounds overnight or more than 5 pounds in a week she would increase to 20 mg that day and would need to let us know.  She will continue to weigh herself daily.  Discussed fluid restriction and salt restriction.  We will plan on checking a BMP next week.

## 2022-05-11 NOTE — Patient Instructions (Signed)
Nice to see you. You will take Lasix 10 mg once daily.  If you have a weight gain of 2 or more pounds overnight or 5 or more pounds within a week you will take 20 mg of Lasix that day.  Please continue to weigh yourself.  If your weight does trend up please let us know. Please take 20 mg of Lasix today and reweigh yourself tomorrow.  If your weight has trended down you can continue with 10 mg of Lasix daily.  If your weight has not trended down please continue with the 20 mg of Lasix daily.

## 2022-05-11 NOTE — Telephone Encounter (Signed)
Noted  

## 2022-05-11 NOTE — Assessment & Plan Note (Signed)
A1c was in the normal range when checked earlier this week.  We will continue to periodically monitor her sugar to determine if it goes back up.

## 2022-05-15 DIAGNOSIS — M25642 Stiffness of left hand, not elsewhere classified: Secondary | ICD-10-CM | POA: Diagnosis not present

## 2022-05-15 DIAGNOSIS — F251 Schizoaffective disorder, depressive type: Secondary | ICD-10-CM | POA: Diagnosis not present

## 2022-05-15 DIAGNOSIS — Z79899 Other long term (current) drug therapy: Secondary | ICD-10-CM | POA: Diagnosis not present

## 2022-05-17 ENCOUNTER — Telehealth: Payer: Self-pay | Admitting: Family Medicine

## 2022-05-17 DIAGNOSIS — I5030 Unspecified diastolic (congestive) heart failure: Secondary | ICD-10-CM | POA: Diagnosis not present

## 2022-05-17 DIAGNOSIS — I5023 Acute on chronic systolic (congestive) heart failure: Secondary | ICD-10-CM | POA: Diagnosis not present

## 2022-05-17 DIAGNOSIS — E119 Type 2 diabetes mellitus without complications: Secondary | ICD-10-CM | POA: Diagnosis not present

## 2022-05-17 DIAGNOSIS — I252 Old myocardial infarction: Secondary | ICD-10-CM | POA: Diagnosis not present

## 2022-05-17 DIAGNOSIS — I110 Hypertensive heart disease with heart failure: Secondary | ICD-10-CM | POA: Diagnosis not present

## 2022-05-17 DIAGNOSIS — I2510 Atherosclerotic heart disease of native coronary artery without angina pectoris: Secondary | ICD-10-CM | POA: Diagnosis not present

## 2022-05-17 NOTE — Telephone Encounter (Signed)
[name] From [facility] called in stating that pt decline the OT evaluation.Marland KitchenMarland KitchenMarland Kitchen

## 2022-05-18 ENCOUNTER — Other Ambulatory Visit (INDEPENDENT_AMBULATORY_CARE_PROVIDER_SITE_OTHER): Payer: Medicare Other

## 2022-05-18 DIAGNOSIS — I5022 Chronic systolic (congestive) heart failure: Secondary | ICD-10-CM

## 2022-05-18 NOTE — Addendum Note (Signed)
Addended by: Leeanne Rio on: 05/18/2022 02:38 PM   Modules accepted: Orders

## 2022-05-19 LAB — BASIC METABOLIC PANEL
BUN/Creatinine Ratio: 16 (ref 12–28)
BUN: 16 mg/dL (ref 8–27)
CO2: 20 mmol/L (ref 20–29)
Calcium: 9.5 mg/dL (ref 8.7–10.3)
Chloride: 106 mmol/L (ref 96–106)
Creatinine, Ser: 1 mg/dL (ref 0.57–1.00)
Glucose: 71 mg/dL (ref 70–99)
Potassium: 4 mmol/L (ref 3.5–5.2)
Sodium: 139 mmol/L (ref 134–144)
eGFR: 60 mL/min/{1.73_m2} (ref >59)

## 2022-05-21 ENCOUNTER — Encounter: Payer: Self-pay | Admitting: Cardiology

## 2022-05-21 DIAGNOSIS — H35371 Puckering of macula, right eye: Secondary | ICD-10-CM | POA: Diagnosis not present

## 2022-05-21 DIAGNOSIS — H401132 Primary open-angle glaucoma, bilateral, moderate stage: Secondary | ICD-10-CM | POA: Diagnosis not present

## 2022-05-23 DIAGNOSIS — E119 Type 2 diabetes mellitus without complications: Secondary | ICD-10-CM | POA: Diagnosis not present

## 2022-05-23 DIAGNOSIS — I5030 Unspecified diastolic (congestive) heart failure: Secondary | ICD-10-CM | POA: Diagnosis not present

## 2022-05-23 DIAGNOSIS — I2510 Atherosclerotic heart disease of native coronary artery without angina pectoris: Secondary | ICD-10-CM | POA: Diagnosis not present

## 2022-05-23 DIAGNOSIS — I110 Hypertensive heart disease with heart failure: Secondary | ICD-10-CM | POA: Diagnosis not present

## 2022-05-23 DIAGNOSIS — I252 Old myocardial infarction: Secondary | ICD-10-CM | POA: Diagnosis not present

## 2022-05-23 DIAGNOSIS — I5023 Acute on chronic systolic (congestive) heart failure: Secondary | ICD-10-CM | POA: Diagnosis not present

## 2022-05-24 ENCOUNTER — Other Ambulatory Visit: Payer: Self-pay | Admitting: Cardiovascular Disease

## 2022-05-28 ENCOUNTER — Encounter (HOSPITAL_COMMUNITY): Payer: Self-pay

## 2022-05-28 ENCOUNTER — Other Ambulatory Visit (HOSPITAL_COMMUNITY): Payer: Self-pay

## 2022-05-28 ENCOUNTER — Ambulatory Visit (HOSPITAL_COMMUNITY)
Admit: 2022-05-28 | Discharge: 2022-05-28 | Disposition: A | Discharge status: Home / Self Care | Payer: Medicare Other | Attending: Family Medicine | Admitting: Family Medicine

## 2022-05-28 VITALS — BP 146/78 | HR 80 | Wt 130.0 lb

## 2022-05-28 DIAGNOSIS — I340 Nonrheumatic mitral (valve) insufficiency: Secondary | ICD-10-CM | POA: Diagnosis not present

## 2022-05-28 DIAGNOSIS — I447 Left bundle-branch block, unspecified: Secondary | ICD-10-CM | POA: Insufficient documentation

## 2022-05-28 DIAGNOSIS — Z91148 Patient's other noncompliance with medication regimen for other reason: Secondary | ICD-10-CM | POA: Insufficient documentation

## 2022-05-28 DIAGNOSIS — F429 Obsessive-compulsive disorder, unspecified: Secondary | ICD-10-CM | POA: Diagnosis not present

## 2022-05-28 DIAGNOSIS — I2510 Atherosclerotic heart disease of native coronary artery without angina pectoris: Secondary | ICD-10-CM | POA: Diagnosis not present

## 2022-05-28 DIAGNOSIS — K760 Fatty (change of) liver, not elsewhere classified: Secondary | ICD-10-CM | POA: Diagnosis not present

## 2022-05-28 DIAGNOSIS — I255 Ischemic cardiomyopathy: Secondary | ICD-10-CM | POA: Diagnosis not present

## 2022-05-28 DIAGNOSIS — I5022 Chronic systolic (congestive) heart failure: Secondary | ICD-10-CM | POA: Insufficient documentation

## 2022-05-28 DIAGNOSIS — E119 Type 2 diabetes mellitus without complications: Secondary | ICD-10-CM | POA: Insufficient documentation

## 2022-05-28 DIAGNOSIS — I10 Essential (primary) hypertension: Secondary | ICD-10-CM | POA: Diagnosis not present

## 2022-05-28 DIAGNOSIS — I252 Old myocardial infarction: Secondary | ICD-10-CM | POA: Insufficient documentation

## 2022-05-28 DIAGNOSIS — Z7902 Long term (current) use of antithrombotics/antiplatelets: Secondary | ICD-10-CM | POA: Diagnosis not present

## 2022-05-28 DIAGNOSIS — G4733 Obstructive sleep apnea (adult) (pediatric): Secondary | ICD-10-CM | POA: Insufficient documentation

## 2022-05-28 DIAGNOSIS — Z8673 Personal history of transient ischemic attack (TIA), and cerebral infarction without residual deficits: Secondary | ICD-10-CM | POA: Diagnosis not present

## 2022-05-28 DIAGNOSIS — Z87891 Personal history of nicotine dependence: Secondary | ICD-10-CM | POA: Insufficient documentation

## 2022-05-28 DIAGNOSIS — E785 Hyperlipidemia, unspecified: Secondary | ICD-10-CM | POA: Diagnosis not present

## 2022-05-28 DIAGNOSIS — Z955 Presence of coronary angioplasty implant and graft: Secondary | ICD-10-CM | POA: Diagnosis not present

## 2022-05-28 DIAGNOSIS — N179 Acute kidney failure, unspecified: Secondary | ICD-10-CM | POA: Insufficient documentation

## 2022-05-28 DIAGNOSIS — Z79899 Other long term (current) drug therapy: Secondary | ICD-10-CM | POA: Insufficient documentation

## 2022-05-28 DIAGNOSIS — Z95810 Presence of automatic (implantable) cardiac defibrillator: Secondary | ICD-10-CM

## 2022-05-28 DIAGNOSIS — I110 Hypertensive heart disease with heart failure: Secondary | ICD-10-CM | POA: Insufficient documentation

## 2022-05-28 MED ORDER — DAPAGLIFLOZIN PROPANEDIOL 10 MG PO TABS: 10.0000 mg | ORAL_TABLET | Freq: Every day | ORAL | 6 refills | Status: DC

## 2022-05-28 MED ORDER — FUROSEMIDE 20 MG PO TABS: 20.0000 mg | ORAL_TABLET | Freq: Every day | ORAL | 6 refills | Status: DC

## 2022-05-28 NOTE — Progress Notes (Signed)
ADVANCED HF CLINIC NOTE  Primary Care: Melanie Haven, MD Primary Cardiologist: Melanie Mocha, MD HF Cardiologist: Dr. Haroldine Ray  HPI: [name] is a 71 y.o. female with the following medical issues. She was referred by Dr. Burt Ray for further assessment and management of her HF.  Coronary artery disease  S/p Anterior STEMI 5/17 >> PCI: DES x 2 to LAD Myoview 7/21: EF 20, no ischemia Heart failure with reduced ejection fraction   Ischemic CM EF 30-35 at time of MI 5/17 >> DC on [facility] >> Pt DC'd at FU visit Echocardiogram 8/17: EF 25-30 Echocardiogram 3/18: EF 30-35 Echocardiogram 8/19: EF 30-35 EP eval (Melanie Ray) x 2 >> pt declined CRT-D Intol of Entresto Echocardiogram 03/2020: EF < 20% Myoview 7/21 EF 20% no ischemia Obsessive Compulsive d/o Diabetes mellitus 2 Hypertension  Hyperlipidemia  LBBB OSA Hepatic steatosis  Hx of cerebellar CVA 03/2020 Bilateral vertebral artery stenosis   We saw her for the first time in 3/22. Referred for cMRI to assess degree of LAD scar.  cMRI 1/23 1. Moderately dilated LV with diffuse hypokinesis and septal-lateral dyssynchrony consistent with LBBB. EF 17%. 2.  Normal RV size and systolic function, EF 54%. 3. At least moderate MR, likely functional with posterior leaflet restriction. Unable to quantify as flow sequences not done. 4.  No significant LGE noted. 5. With lack of LGE, LBBB cardiomyopathy may be a major contributor to low EF.  Was admitted 12/22 with HF flare. Was not taking lasix regularly. Also ate a lot of salt over Xmas.    CRT-D placed 01/02/22 with LBBB lead by Dr. Quentin Ray  Follow up 5/23, NYHA II-III, volume OK.   Admitted 7/23 with a/c CHF due to noncompliance with Lasix. Diuresed with IV lasix, transitioned to po Lasix 10 mg daily. Discharged home, weight 127 lbs.  Echo (7/23) showed EF 20-25%, global LV HK, RV ok, moderate MR.  Today she returns for post hospital HF follow up. Overall  feeling fine. She has SOB with walking up stairs. Rare dizziness with standing. Denies palpitations, CP, edema, or PND/Orthopnea. Appetite ok. No fever or chills. Weight at home 130 pounds. Taking all medications. Trying to do better with salt and fluid intake. Has been taking her lasix every other day.  Cardiac Studies: - Echo (7/23) showed EF 20-25%, global LV HK, RV ok, moderate MR.  - CRT-D with LBBB lead (2/23)  - cMRI (1/23): LVEF 17%, RVEF 59%, moderate MR, no LGE, moderately dilated LV with diffuse hypokinesis and septal-lateral dyssynchrony consistent with LBBB.  - Myoview (7/21): EF 20% no ischemia  Past Medical History:  Diagnosis Date   Anemia    Anxiety    Back pain    CAD (coronary artery disease) 04/11/2016   S/p ant STEMI 5/17: LHC >> LAD proximal 80%, mid 80%, distal 50%, ostial D1 60%; LCx with LPDA lesion 30%; RCA Mild calcification with no significant stenosis in a medium caliber, nondominant RCA; LVEF is estimated at 45% with inferoapical and lateral wall akinesis >> PCI: PCI: 3.5 x 24 mm Promus DES to prox LAD, 2.5 x 12 mm Promus DES to mid LAD. // Myoview 7/21: EF 20, no ischemia; high risk    Chest pain    Chronic systolic CHF (congestive heart failure) ([place]) 03/21/2016   Echo 01/30/17: Diff HK, mild focal basal septal hypertrophy, EF 30-35, mild AI, MAC, mild MR // Echo 06/08/16: Mild focal basal septal hypertrophy, EF 25-30%, diff HK, ant-septal AK, Gr 1  DD, mild AI, MAC, mild MR, PASP 37 mmHg // Echo 03/18/16: EF 30-35%, ant-septal AK, Gr 1 DD, mild MR, severe LAE.   Constipation    Depression    Diabetes mellitus type 2 in obese (HCC)    Dizziness    Glaucoma    Gout    Heart disease    Heartburn    History of acute anterior wall MI 03/17/2016   History of heart attack    Hyperlipemia    Hypertension    Ischemic cardiomyopathy 10/02/2016   Refused ICD   Joint pain    Left bundle branch block    Myocardial infarction (HCC)    Nausea    Positive colorectal  cancer screening using Cologuard test 03/01/2020   Sleep apnea    SOB (shortness of breath)    Suicidal ideation 08/27/2018   Swelling    feet or legs   Current Outpatient Medications  Medication Sig Dispense Refill   atorvastatin (LIPITOR) 80 MG tablet Take 1 tablet by mouth once daily 90 tablet 3   carvedilol (COREG) 12.5 MG tablet TAKE 1 TABLET BY MOUTH TWICE DAILY WITH MEALS 60 tablet 0   clopidogrel (PLAVIX) 75 MG tablet Take 1 tablet by mouth once daily 90 tablet 0   dorzolamide-timolol (COSOPT) 22.3-6.8 MG/ML ophthalmic solution Place 1 drop into both eyes 2 (two) times daily.      furosemide (LASIX) 20 MG tablet Take 0.5 tablets (10 mg total) by mouth daily. OK to take 1 tablet (20 mg total) by mouth daily as needed for increased leg swelling, shortness of breath, 3-5 lb weight gain in 2 days. 30 tablet 0   isosorbide mononitrate (IMDUR) 30 MG 24 hr tablet Take 1 tablet by mouth once daily 90 tablet 0   latanoprost (XALATAN) 0.005 % ophthalmic solution Place 1 drop into both eyes at bedtime.     LORazepam (ATIVAN) 0.5 MG tablet Take 0.5 mg by mouth daily as needed for anxiety.     nitroGLYCERIN (NITROSTAT) 0.4 MG SL tablet DISSOLVE ONE TABLET UNDER THE TONGUE EVERY FIVE MINUTES AS NEEDED FOR CHEST PAIN. DO NOT EXCEED A TOTAL OF THREE DOSES IN 15 MINUTES 25 tablet 4   traZODone (DESYREL) 150 MG tablet Take 150 mg by mouth at bedtime.     valsartan (DIOVAN) 80 MG tablet Take 80 mg by mouth daily.     venlafaxine XR (EFFEXOR-XR) 150 MG 24 hr capsule TAKE 2 CAPSULES BY MOUTH ONCE DAILY (NEEDS to follow-up with psychiatry for further refills) 60 capsule 0   No current facility-administered medications for this encounter.   Allergies  Allergen Reactions   Tetracycline Swelling   Meperidine Nausea And Vomiting    Nausea   Entresto [Sacubitril-Valsartan] Other (See Comments)    Shortness of Breath   Social History   Socioeconomic History   Marital status: Married    Spouse name:  Melanie Ray   Number of children: 0   Years of education: BS in education   Highest education level: Not on file  Occupational History   Occupation:  Retired Education officer, museum  Tobacco Use   Smoking status: Former    Types: Cigarettes    Quit date: 04/13/1997    Years since quitting: 25.1   Smokeless tobacco: Never  Vaping Use   Vaping Use: Never used  Substance and Sexual Activity   Alcohol use: Not Currently   Drug use: No   Sexual activity: Not Currently  Other Topics Concern   Not  on file  Social History Narrative   Lives in [place] with spouse.  No children.   Retired first [employer] for over 30 years ([place] for 10 years and then in [place] for over 20 years).   Left-handed   Lives in a two story home       Social Determinants of Health   Financial Resource Strain: Low Risk  (03/30/2022)   Overall Financial Resource Strain (CARDIA)    Difficulty of Paying Living Expenses: Not hard at all  Food Insecurity: No Food Insecurity (03/30/2022)   Hunger Vital Sign    Worried About Running Out of Food in the Last Year: Never true    Ran Out of Food in the Last Year: Never true  Transportation Needs: No Transportation Needs (03/30/2022)   PRAPARE - Hydrologist (Medical): No    Lack of Transportation (Non-Medical): No  Physical Activity: Not on file  Stress: No Stress Concern Present (03/30/2022)   [place]    Feeling of Stress : Not at all  Social Connections: Unknown (03/30/2022)   Social Connection and Isolation Panel [NHANES]    Frequency of Communication with Friends and Family: Not on file    Frequency of Social Gatherings with Friends and Family: Not on file    Attends Religious Services: Not on file    Active Member of Clubs or Organizations: Not on file    Attends Archivist Meetings: Not on file    Marital Status: Married  Intimate Partner  Violence: Not At Risk (03/30/2022)   Humiliation, Afraid, Rape, and Kick questionnaire    Fear of Current or Ex-Partner: No    Emotionally Abused: No    Physically Abused: No    Sexually Abused: No    Family History  Problem Relation Age of Onset   Anxiety disorder Mother    Paranoid behavior Mother    Hypertension Mother    Dementia Mother    High Cholesterol Mother    Diabetes Mother    Hyperlipidemia Mother    Depression Mother    Hypertension Father    High Cholesterol Father    Mood Disorder Sister    Stroke Sister    Anxiety disorder Maternal Aunt    Tuberculosis Paternal Grandfather    Drug abuse Cousin    BP (!) 146/78   Pulse 80   Wt 59 kg (130 lb)   SpO2 100%   BMI 23.03 kg/m   Wt Readings from Last 3 Encounters:  05/28/22 59 kg (130 lb)  05/11/22 58.6 kg (129 lb 3.2 oz)  05/09/22 57.7 kg (127 lb 1.6 oz)   PHYSICAL EXAM: General:  NAD. No resp difficulty HEENT: Normal Neck: Supple. No JVD. Carotids 2+ bilat; no bruits. No lymphadenopathy or thryomegaly appreciated. Cor: PMI nondisplaced. Regular rate & rhythm. No rubs, gallops or murmurs. Lungs: Clear Abdomen: Soft, nontender, nondistended. No hepatosplenomegaly. No bruits or masses. Good bowel sounds. Extremities: No cyanosis, clubbing, rash, edema Neuro: Alert & oriented x 3, cranial nerves grossly intact. Moves all 4 extremities w/o difficulty. Affect pleasant.   ECG (personally reviewed): AV paced 72 bpm, QRS 108 msec  Device interrogation (personally reviewed): CoreVue stable, > 99% BiV pacing. No AT/AF  ASSESSMENT & PLAN: 1. Chronic systolic HF - initially felt due to ischemic CM. S/p Anterior STEMI 5/17  - Echo (8/19): EF 30-35%. - Echo 5/21 EF < 20%.   - cMRI 1/23  EF 15% No LGE. RV ok. Severe LV dyssynchrony Moderate MR  - CRT-D placed 01/02/22 with LBBB lead by Dr. Quentin Ray - Echo (7/23): EF 20-25%, RV ok - Improved NYHA II, volume OK, weight up a couple lbs from discharge. - Change Lasix  to 20 mg PRN. - Start [name] 10 mg daily. She is agreeable to re-trial. Samples given. We discussed potential side effects. Watch for volume depletion. - Continue carvedilol 12.5 mg bid. - Continue valsartan 80 mg bid (could not afford Entresto and made her SOB) - Continue Imdur 30 mg daily. - Off spiro due to AKI - QRS 153 -> 108 with CRT.  - She has repeat echo scheduled to reassess for LV improvement post-CRT. Mild improvement on echo during recent admit. - Recent labs ok, K 4.0, creatinine 1.00   2. Coronary artery disease involving native coronary artery of native heart without angina pectoris - Hx of anterior STEMI in 5/17 tx with DES x 2 to the LAD.   - Myoview (7/21)  EF 20% no ischemia.   - No LGE on cMRI (1/23) - No s/s angina - Continue carvedilol, clopidogrel, atorvastatin.  - Lipid management per PCP. Goal LDL < 70   3. Essential hypertension - Mildly elevated today. - Meds as above.  4. LBBB - s/p CRT-D 3/23 with Dr. Delight Stare  5. Mitral regurgitation - Moderate on echo (7/23). No murmur on exam today. - Suspect will improve with CRT  Follow up in 2 months with Dr. Haroldine Ray as scheduled.  Rafael Bihari, FNP  9:48 AM

## 2022-05-28 NOTE — Addendum Note (Signed)
Encounter addended by: Payton Mccallum, RN on: 05/28/2022 10:30 AM  Actions taken: Alternative orders not taken and original order placed, Order list changed

## 2022-05-28 NOTE — Patient Instructions (Signed)
Thank you for coming in today  No labs today   CHANGE Lasix to 20 mg 1 tablet For weight gain of 3 lbs in 24 hours or 5 lbs in a week  RESTART [name] 10 mg 1 tablet daily   Your physician recommends that you schedule a follow-up appointment in:  Keep follow up with Dr. Haroldine Laws    Do the following things EVERYDAY: Weigh yourself in the morning before breakfast. Write it down and keep it in a log. Take your medicines as prescribed Eat low salt foods--Limit salt (sodium) to 2000 mg per day.  Stay as active as you can everyday Limit all fluids for the day to less than 2 liters  At the [facility], you and your health needs are our priority. As part of our continuing mission to provide you with exceptional heart care, we have created designated Provider Care Teams. These Care Teams include your primary Cardiologist (physician) and Advanced Practice Providers (APPs- Physician Assistants and Nurse Practitioners) who all work together to provide you with the care you need, when you need it.   You may see any of the following providers on your designated Care Team at your next follow up: Dr Glori Bickers Dr Haynes Kerns, NP Lyda Jester, [place] [facility] [place], [place] Audry Riles, PharmD   Please be sure to bring in all your medications bottles to every appointment.   If you have any questions or concerns before your next appointment please send [name] a message through [place] or call our office at [phone].    TO LEAVE A MESSAGE FOR THE NURSE SELECT OPTION 2, PLEASE LEAVE A MESSAGE INCLUDING: YOUR NAME DATE OF BIRTH CALL BACK NUMBER REASON FOR CALL**this is important as we prioritize the call backs  YOU WILL RECEIVE A CALL BACK THE SAME DAY AS LONG AS YOU CALL BEFORE 4:00 PM

## 2022-05-28 NOTE — Progress Notes (Signed)
Pt given 3 week samples of farxiga 10 mg   Lot # [id number] Expiration date 09/04/2024

## 2022-05-29 ENCOUNTER — Other Ambulatory Visit (HOSPITAL_COMMUNITY): Payer: Self-pay | Admitting: Internal Medicine

## 2022-05-30 DIAGNOSIS — I110 Hypertensive heart disease with heart failure: Secondary | ICD-10-CM | POA: Diagnosis not present

## 2022-05-30 DIAGNOSIS — I2510 Atherosclerotic heart disease of native coronary artery without angina pectoris: Secondary | ICD-10-CM | POA: Diagnosis not present

## 2022-05-30 DIAGNOSIS — F419 Anxiety disorder, unspecified: Secondary | ICD-10-CM | POA: Diagnosis not present

## 2022-05-30 DIAGNOSIS — E119 Type 2 diabetes mellitus without complications: Secondary | ICD-10-CM | POA: Diagnosis not present

## 2022-05-30 DIAGNOSIS — Z7902 Long term (current) use of antithrombotics/antiplatelets: Secondary | ICD-10-CM

## 2022-05-30 DIAGNOSIS — H409 Unspecified glaucoma: Secondary | ICD-10-CM

## 2022-05-30 DIAGNOSIS — I255 Ischemic cardiomyopathy: Secondary | ICD-10-CM | POA: Diagnosis not present

## 2022-05-30 DIAGNOSIS — I447 Left bundle-branch block, unspecified: Secondary | ICD-10-CM | POA: Diagnosis not present

## 2022-05-30 DIAGNOSIS — Z9181 History of falling: Secondary | ICD-10-CM

## 2022-05-30 DIAGNOSIS — G4733 Obstructive sleep apnea (adult) (pediatric): Secondary | ICD-10-CM

## 2022-05-30 DIAGNOSIS — M109 Gout, unspecified: Secondary | ICD-10-CM | POA: Diagnosis not present

## 2022-05-30 DIAGNOSIS — Z87891 Personal history of nicotine dependence: Secondary | ICD-10-CM

## 2022-05-30 DIAGNOSIS — I5030 Unspecified diastolic (congestive) heart failure: Secondary | ICD-10-CM | POA: Diagnosis not present

## 2022-05-30 DIAGNOSIS — I5023 Acute on chronic systolic (congestive) heart failure: Secondary | ICD-10-CM | POA: Diagnosis not present

## 2022-05-30 DIAGNOSIS — E785 Hyperlipidemia, unspecified: Secondary | ICD-10-CM | POA: Diagnosis not present

## 2022-05-30 DIAGNOSIS — Z8673 Personal history of transient ischemic attack (TIA), and cerebral infarction without residual deficits: Secondary | ICD-10-CM

## 2022-05-30 DIAGNOSIS — Z91199 Patient's noncompliance with other medical treatment and regimen due to unspecified reason: Secondary | ICD-10-CM

## 2022-05-30 DIAGNOSIS — I080 Rheumatic disorders of both mitral and aortic valves: Secondary | ICD-10-CM | POA: Diagnosis not present

## 2022-05-30 DIAGNOSIS — I252 Old myocardial infarction: Secondary | ICD-10-CM | POA: Diagnosis not present

## 2022-05-30 DIAGNOSIS — Z91119 Patient's noncompliance with dietary regimen due to unspecified reason: Secondary | ICD-10-CM

## 2022-05-30 DIAGNOSIS — F32A Depression, unspecified: Secondary | ICD-10-CM

## 2022-06-04 DIAGNOSIS — H401132 Primary open-angle glaucoma, bilateral, moderate stage: Secondary | ICD-10-CM | POA: Diagnosis not present

## 2022-06-05 ENCOUNTER — Telehealth: Payer: Self-pay | Admitting: Family Medicine

## 2022-06-05 DIAGNOSIS — F251 Schizoaffective disorder, depressive type: Secondary | ICD-10-CM | POA: Diagnosis not present

## 2022-06-05 DIAGNOSIS — Z79899 Other long term (current) drug therapy: Secondary | ICD-10-CM | POA: Diagnosis not present

## 2022-06-05 NOTE — Telephone Encounter (Signed)
[place] from [facility] called stating pt need to be seen because she has some mental changes. Pt has history of heart failure and strokes. Pt was disoriented, lost and confused at her appointment. Hedding would like to be called for a follow-up about pt when seen

## 2022-06-06 DIAGNOSIS — E119 Type 2 diabetes mellitus without complications: Secondary | ICD-10-CM | POA: Diagnosis not present

## 2022-06-06 DIAGNOSIS — I252 Old myocardial infarction: Secondary | ICD-10-CM | POA: Diagnosis not present

## 2022-06-06 DIAGNOSIS — I5030 Unspecified diastolic (congestive) heart failure: Secondary | ICD-10-CM | POA: Diagnosis not present

## 2022-06-06 DIAGNOSIS — I110 Hypertensive heart disease with heart failure: Secondary | ICD-10-CM | POA: Diagnosis not present

## 2022-06-06 DIAGNOSIS — I5023 Acute on chronic systolic (congestive) heart failure: Secondary | ICD-10-CM | POA: Diagnosis not present

## 2022-06-06 DIAGNOSIS — I2510 Atherosclerotic heart disease of native coronary artery without angina pectoris: Secondary | ICD-10-CM | POA: Diagnosis not present

## 2022-06-06 NOTE — Telephone Encounter (Signed)
I called and spoke with Mrs. Melanie Ray  from [place] care and informed her that the patient has an upcoming appointment and she understood.  Nia,cma

## 2022-06-12 DIAGNOSIS — M25642 Stiffness of left hand, not elsewhere classified: Secondary | ICD-10-CM | POA: Diagnosis not present

## 2022-06-22 ENCOUNTER — Other Ambulatory Visit: Payer: Self-pay | Admitting: Internal Medicine

## 2022-06-22 DIAGNOSIS — U071 COVID-19: Secondary | ICD-10-CM | POA: Diagnosis not present

## 2022-06-22 DIAGNOSIS — R5383 Other fatigue: Secondary | ICD-10-CM | POA: Diagnosis not present

## 2022-06-23 ENCOUNTER — Other Ambulatory Visit: Payer: Self-pay | Admitting: Cardiovascular Disease

## 2022-06-25 NOTE — Telephone Encounter (Signed)
Refill request

## 2022-06-27 ENCOUNTER — Encounter: Payer: Self-pay | Admitting: Family Medicine

## 2022-06-27 ENCOUNTER — Ambulatory Visit (INDEPENDENT_AMBULATORY_CARE_PROVIDER_SITE_OTHER): Payer: Medicare Other | Admitting: Family Medicine

## 2022-06-27 VITALS — Ht 63.0 in | Wt 129.0 lb

## 2022-06-27 DIAGNOSIS — F251 Schizoaffective disorder, depressive type: Secondary | ICD-10-CM | POA: Diagnosis not present

## 2022-06-27 DIAGNOSIS — R413 Other amnesia: Secondary | ICD-10-CM

## 2022-06-27 DIAGNOSIS — Z8616 Personal history of COVID-19: Secondary | ICD-10-CM

## 2022-06-27 DIAGNOSIS — U071 COVID-19: Secondary | ICD-10-CM | POA: Diagnosis not present

## 2022-06-27 DIAGNOSIS — R42 Dizziness and giddiness: Secondary | ICD-10-CM

## 2022-06-27 DIAGNOSIS — Z79899 Other long term (current) drug therapy: Secondary | ICD-10-CM | POA: Diagnosis not present

## 2022-06-27 HISTORY — DX: Personal history of COVID-19: Z86.16

## 2022-06-27 NOTE — Assessment & Plan Note (Signed)
Patient's symptoms have improved significantly.  Discussed that the cough could persist for a number of weeks.  If she is not improving over the next week or so she will let us know.  Advised to seek medical attention for any worsening symptoms or new symptoms.

## 2022-06-27 NOTE — Assessment & Plan Note (Signed)
Notes some memory issues.  We will refer to neurology at the patient's request.

## 2022-06-27 NOTE — Progress Notes (Signed)
Virtual Visit via telephone Note  This visit type was conducted due to national recommendations for restrictions regarding the COVID-19 pandemic (e.g. social distancing).  This format is felt to be most appropriate for this patient at this time.  All issues noted in this document were discussed and addressed.  No physical exam was performed (except for noted visual exam findings with Video Visits).   I connected with Melanie Ray today at  2:15 PM EDT by telephone and verified that I am speaking with the correct person using two identifiers. Location patient: home Location provider: work  Persons participating in the virtual visit: patient, provider  I discussed the limitations, risks, security and privacy concerns of performing an evaluation and management service by telephone and the availability of in person appointments. I also discussed with the patient that there may be a patient responsible charge related to this service. The patient expressed understanding and agreed to proceed.  Interactive audio and video telecommunications were attempted between this provider and patient, however failed, due to patient having technical difficulties OR patient did not have access to video capability.  We continued and completed visit with audio only.   Reason for visit: f/u  HPI: COVID-19: Patient notes symptoms started at least 10 days ago.  She thought she had a cold and was treating with over-the-counter Mucinex DM.  She subsequently went to urgent care and was diagnosed with COVID-19.  They treated her with Flonase and Tessalon.  She notes she has improved since last week though still has some cough that bothers her.  She wonders about the need to quarantine.  Memory difficulty/dizziness: Patient has had intermittent dizziness most of this year.  She has seen ENT and she notes they advised that this was not an ENT issue.  She notes her memory has been slipping since June and she forgets  little things.  At times when she is out she does not recognize where she is at.  There was a time recently where she was riding with her husband in the car and she noted it seemed as though the road was smaller and narrower than it should have been.  Patient is requesting a neurology evaluation.   ROS: See pertinent positives and negatives per HPI.  Past Medical History:  Diagnosis Date   Anemia    Anxiety    Back pain    CAD (coronary artery disease) 04/11/2016   S/p ant STEMI 5/17: LHC >> LAD proximal 80%, mid 80%, distal 50%, ostial D1 60%; LCx with LPDA lesion 30%; RCA Mild calcification with no significant stenosis in a medium caliber, nondominant RCA; LVEF is estimated at 45% with inferoapical and lateral wall akinesis >> PCI: PCI: 3.5 x 24 mm Promus DES to prox LAD, 2.5 x 12 mm Promus DES to mid LAD. // Myoview 7/21: EF 20, no ischemia; high risk    Chest pain    Chronic systolic CHF (congestive heart failure) ([place]) 03/21/2016   Echo 01/30/17: Diff HK, mild focal basal septal hypertrophy, EF 30-35, mild AI, MAC, mild MR // Echo 06/08/16: Mild focal basal septal hypertrophy, EF 25-30%, diff HK, ant-septal AK, Gr 1 DD, mild AI, MAC, mild MR, PASP 37 mmHg // Echo 03/18/16: EF 30-35%, ant-septal AK, Gr 1 DD, mild MR, severe LAE.   Constipation    Depression    Diabetes mellitus type 2 in obese (HCC)    Dizziness    Glaucoma    Gout    Heart disease  Heartburn    History of acute anterior wall MI 03/17/2016   History of heart attack    Hyperlipemia    Hypertension    Ischemic cardiomyopathy 10/02/2016   Refused ICD   Joint pain    Left bundle branch block    Myocardial infarction ([place])    Nausea    Positive colorectal cancer screening using Cologuard test 03/01/2020   Sleep apnea    SOB (shortness of breath)    Suicidal ideation 08/27/2018   Swelling    feet or legs    Past Surgical History:  Procedure Laterality Date   APPENDECTOMY     BIV ICD INSERTION CRT-D N/A 01/02/2022    Procedure: BIV ICD INSERTION CRT-D;  Surgeon: Vickie Epley, MD;  Location: [facility];  Service: Cardiovascular;  Laterality: N/A;   CARDIAC CATHETERIZATION N/A 03/17/2016   Procedure: Left Heart Cath and Coronary Angiography;  Surgeon: Sherren Mocha, MD;  Location: [facility];  Service: Cardiovascular;  Laterality: N/A;   CARDIAC CATHETERIZATION N/A 03/17/2016   Procedure: Coronary Stent Intervention;  Surgeon: Sherren Mocha, MD;  Location: [facility];  Service: Cardiovascular;  Laterality: N/A;   COLONOSCOPY WITH PROPOFOL N/A 08/13/2017   Procedure: COLONOSCOPY WITH PROPOFOL;  Surgeon: Jonathon Bellows, MD;  Location: [facility];  Service: Gastroenterology;  Laterality: N/A;   COLONOSCOPY WITH PROPOFOL N/A 03/29/2020   Procedure: COLONOSCOPY WITH PROPOFOL;  Surgeon: Lucilla Lame, MD;  Location: [facility];  Service: Endoscopy;  Laterality: N/A;   SMALL BOWEL REPAIR     TONSILLECTOMY     UTERINE FIBROID SURGERY      Family History  Problem Relation Age of Onset   Anxiety disorder Mother    Paranoid behavior Mother    Hypertension Mother    Dementia Mother    High Cholesterol Mother    Diabetes Mother    Hyperlipidemia Mother    Depression Mother    Hypertension Father    High Cholesterol Father    Mood Disorder Sister    Stroke Sister    Anxiety disorder Maternal Aunt    Tuberculosis Paternal Grandfather    Drug abuse Cousin     SOCIAL HX: Former smoker   Current Outpatient Medications:    atorvastatin (LIPITOR) 80 MG tablet, Take 1 tablet by mouth once daily, Disp: 90 tablet, Rfl: 3   benzonatate (TESSALON) 200 MG capsule, Take 200 mg by mouth 3 (three) times daily as needed., Disp: , Rfl:    brimonidine (ALPHAGAN) 0.2 % ophthalmic solution, SMARTSIG:In Eye(s), Disp: , Rfl:    carvedilol (COREG) 12.5 MG tablet, TAKE 1 TABLET BY MOUTH TWICE DAILY WITH MEALS, Disp: 60 tablet, Rfl: 9   clopidogrel (PLAVIX) 75 MG tablet, Take 1 tablet by mouth  once daily, Disp: 90 tablet, Rfl: 0   dapagliflozin propanediol ([name]) 10 MG TABS tablet, Take 1 tablet (10 mg total) by mouth daily before breakfast., Disp: 30 tablet, Rfl: 6   dorzolamide-timolol (COSOPT) 22.3-6.8 MG/ML ophthalmic solution, Place 1 drop into both eyes 2 (two) times daily. , Disp: , Rfl:    fluticasone (FLONASE) 50 MCG/ACT nasal spray, Place 2 sprays into both nostrils daily., Disp: , Rfl:    furosemide (LASIX) 20 MG tablet, Take 1 tablet (20 mg total) by mouth daily. OK to take 1 tablet (20 mg total) by mouth daily as needed for increased leg swelling, shortness of breath, 3lbs in 24 hours and 5 lbs in a week, Disp: 30 tablet, Rfl: 6  isosorbide mononitrate (IMDUR) 30 MG 24 hr tablet, Take 1 tablet by mouth once daily, Disp: 90 tablet, Rfl: 0   latanoprost (XALATAN) 0.005 % ophthalmic solution, Place 1 drop into both eyes at bedtime., Disp: , Rfl:    LORazepam (ATIVAN) 0.5 MG tablet, Take 0.5 mg by mouth daily as needed for anxiety., Disp: , Rfl:    nitroGLYCERIN (NITROSTAT) 0.4 MG SL tablet, DISSOLVE ONE TABLET UNDER THE TONGUE EVERY FIVE MINUTES AS NEEDED FOR CHEST PAIN. DO NOT EXCEED A TOTAL OF THREE DOSES IN 15 MINUTES, Disp: 25 tablet, Rfl: 4   traZODone (DESYREL) 150 MG tablet, Take 150 mg by mouth at bedtime., Disp: , Rfl:    valsartan (DIOVAN) 80 MG tablet, Take 1 tablet by mouth twice daily, Disp: 180 tablet, Rfl: 3   venlafaxine XR (EFFEXOR-XR) 150 MG 24 hr capsule, TAKE 2 CAPSULES BY MOUTH ONCE DAILY (NEEDS to follow-up with psychiatry for further refills), Disp: 60 capsule, Rfl: 0  EXAM: This was a telephone visit and thus no exam was completed.  ASSESSMENT AND PLAN:  Discussed the following assessment and plan:  Problem List Items Addressed This Visit     Dizziness (Chronic)    We will see if neurology can evaluate her dizziness as well.      Relevant Orders   Ambulatory referral to Neurology   Memory difficulty - Primary (Chronic)    Notes some memory  issues.  We will refer to neurology at the patient's request.      Relevant Orders   Ambulatory referral to Neurology   COVID-19    Patient's symptoms have improved significantly.  Discussed that the cough could persist for a number of weeks.  If she is not improving over the next week or so she will let us know.  Advised to seek medical attention for any worsening symptoms or new symptoms.       Return in about 3 months (around 09/27/2022).   I discussed the assessment and treatment plan with the patient. The patient was provided an opportunity to ask questions and all were answered. The patient agreed with the plan and demonstrated an understanding of the instructions.   The patient was advised to call back or seek an in-person evaluation if the symptoms worsen or if the condition fails to improve as anticipated.  I provided 8 minutes of non-face-to-face time during this encounter.   Tommi Rumps, MD

## 2022-06-27 NOTE — Assessment & Plan Note (Signed)
We will see if neurology can evaluate her dizziness as well.

## 2022-06-29 DIAGNOSIS — F251 Schizoaffective disorder, depressive type: Secondary | ICD-10-CM | POA: Diagnosis not present

## 2022-06-29 DIAGNOSIS — Z79899 Other long term (current) drug therapy: Secondary | ICD-10-CM | POA: Diagnosis not present

## 2022-07-06 ENCOUNTER — Encounter: Payer: Self-pay | Admitting: Family Medicine

## 2022-07-06 ENCOUNTER — Ambulatory Visit (INDEPENDENT_AMBULATORY_CARE_PROVIDER_SITE_OTHER): Payer: Medicare Other

## 2022-07-06 ENCOUNTER — Ambulatory Visit (INDEPENDENT_AMBULATORY_CARE_PROVIDER_SITE_OTHER): Payer: Medicare Other | Admitting: Family Medicine

## 2022-07-06 DIAGNOSIS — I255 Ischemic cardiomyopathy: Secondary | ICD-10-CM

## 2022-07-06 DIAGNOSIS — E559 Vitamin D deficiency, unspecified: Secondary | ICD-10-CM

## 2022-07-06 DIAGNOSIS — I5022 Chronic systolic (congestive) heart failure: Secondary | ICD-10-CM

## 2022-07-06 DIAGNOSIS — I10 Essential (primary) hypertension: Secondary | ICD-10-CM

## 2022-07-06 DIAGNOSIS — F419 Anxiety disorder, unspecified: Secondary | ICD-10-CM

## 2022-07-06 DIAGNOSIS — E119 Type 2 diabetes mellitus without complications: Secondary | ICD-10-CM | POA: Diagnosis not present

## 2022-07-06 DIAGNOSIS — U071 COVID-19: Secondary | ICD-10-CM | POA: Diagnosis not present

## 2022-07-06 LAB — COMPREHENSIVE METABOLIC PANEL
ALT: 17 U/L (ref 0–35)
AST: 17 U/L (ref 0–37)
Albumin: 3.8 g/dL (ref 3.5–5.2)
Alkaline Phosphatase: 108 U/L (ref 39–117)
BUN: 15 mg/dL (ref 6–23)
CO2: 26 mEq/L (ref 19–32)
Calcium: 10 mg/dL (ref 8.4–10.5)
Chloride: 106 mEq/L (ref 96–112)
Creatinine, Ser: 0.98 mg/dL (ref 0.40–1.20)
GFR: 57.79 mL/min — ABNORMAL LOW (ref >60.00)
Glucose, Bld: 75 mg/dL (ref 70–99)
Potassium: 4 mEq/L (ref 3.5–5.1)
Sodium: 141 mEq/L (ref 135–145)
Total Bilirubin: 0.8 mg/dL (ref 0.2–1.2)
Total Protein: 6.6 g/dL (ref 6.0–8.3)

## 2022-07-06 LAB — MICROALBUMIN / CREATININE URINE RATIO
Creatinine,U: 83.4 mg/dL
Microalb Creat Ratio: 0.9 mg/g (ref 0.0–30.0)
Microalb, Ur: 0.7 mg/dL (ref 0.0–1.9)

## 2022-07-06 LAB — VITAMIN D 25 HYDROXY (VIT D DEFICIENCY, FRACTURES): VITD: 46.84 ng/mL (ref 30.00–100.00)

## 2022-07-06 NOTE — Assessment & Plan Note (Signed)
Chronic ongoing issue.  Her medications are managed by psychiatry.  Advised that if she ever develops suicidal thoughts with plan or intent she needs to seek medical attention in the emergency department.

## 2022-07-06 NOTE — Assessment & Plan Note (Signed)
A1c in the normal range when checked in July.  She will continue [name] 10 mg daily.

## 2022-07-06 NOTE — Patient Instructions (Signed)
Nice to see you. We will get lab work today and contact you with the results. 

## 2022-07-06 NOTE — Progress Notes (Signed)
Tommi Rumps, MD Phone: [phone]  Melanie Ray is a 71 y.o. female who presents today for f/u.  HYPERTENSION/CHF Disease Monitoring Chest pain- no    Dyspnea- no Orthopnea- no PND- no Medications Compliance-  taking the Coreg, [name], Imdur, and valsartan, she takes Lasix every other day.  Edema- notes mild puffiness today, though she ate pork yesterday BMET    Component Value Date/Time   NA 139 05/18/2022 1439   K 4.0 05/18/2022 1439   CL 106 05/18/2022 1439   CO2 20 05/18/2022 1439   GLUCOSE 71 05/18/2022 1439   GLUCOSE 145 (H) 05/09/2022 0649   BUN 16 05/18/2022 1439   CREATININE 1.00 05/18/2022 1439   CREATININE 1.12 (H) 10/16/2016 1412   CALCIUM 9.5 05/18/2022 1439   GFRNONAA 53 (L) 05/09/2022 0649   GFRAA 58 (L) 11/30/2020 1506   Diabetes: Patient does not check sugars.  She is on [name].  She notes she stays thirsty.  She does urinate more when she takes her Lasix.  No hypoglycemia.  Anxiety: Patient notes she has had hard time with anxiety.  Notes this makes it difficult for her to speak at times.  She is being managed by psychiatry and is on [place] which she takes in the afternoon given that it causes some drowsiness.  She does take lorazepam though does not take this daily.  She is also on Effexor.  She does have intermittent thoughts of killing herself though she has no plan or intent to act on this and she notes her psychiatrist is aware of these thoughts.  She notes no depression.  COVID: Patient has recovered back to her baseline after her recent COVID illness.  Social History   Tobacco Use  Smoking Status Former   Types: Cigarettes   Quit date: 04/13/1997   Years since quitting: 25.2  Smokeless Tobacco Never    Current Outpatient Medications on File Prior to Visit  Medication Sig Dispense Refill   atorvastatin (LIPITOR) 80 MG tablet Take 1 tablet by mouth once daily 90 tablet 3   benzonatate (TESSALON) 200 MG capsule Take 200 mg by mouth  3 (three) times daily as needed.     brimonidine (ALPHAGAN) 0.2 % ophthalmic solution SMARTSIG:In Eye(s)     carvedilol (COREG) 12.5 MG tablet TAKE 1 TABLET BY MOUTH TWICE DAILY WITH MEALS 60 tablet 9   clopidogrel (PLAVIX) 75 MG tablet Take 1 tablet by mouth once daily 90 tablet 0   dapagliflozin propanediol ([name]) 10 MG TABS tablet Take 1 tablet (10 mg total) by mouth daily before breakfast. 30 tablet 6   dorzolamide-timolol (COSOPT) 22.3-6.8 MG/ML ophthalmic solution Place 1 drop into both eyes 2 (two) times daily.      fluticasone (FLONASE) 50 MCG/ACT nasal spray Place 2 sprays into both nostrils daily.     furosemide (LASIX) 20 MG tablet Take 1 tablet (20 mg total) by mouth daily. OK to take 1 tablet (20 mg total) by mouth daily as needed for increased leg swelling, shortness of breath, 3lbs in 24 hours and 5 lbs in a week 30 tablet 6   isosorbide mononitrate (IMDUR) 30 MG 24 hr tablet Take 1 tablet by mouth once daily 90 tablet 0   latanoprost (XALATAN) 0.005 % ophthalmic solution Place 1 drop into both eyes at bedtime.     LORazepam (ATIVAN) 1 MG tablet Take 1 mg by mouth at bedtime as needed.     lumateperone tosylate (CAPLYTA) 42 MG capsule Take 42 mg by mouth  daily.     nitroGLYCERIN (NITROSTAT) 0.4 MG SL tablet DISSOLVE ONE TABLET UNDER THE TONGUE EVERY FIVE MINUTES AS NEEDED FOR CHEST PAIN. DO NOT EXCEED A TOTAL OF THREE DOSES IN 15 MINUTES 25 tablet 4   traZODone (DESYREL) 150 MG tablet Take 150 mg by mouth at bedtime.     valsartan (DIOVAN) 80 MG tablet Take 1 tablet by mouth twice daily 180 tablet 3   venlafaxine XR (EFFEXOR-XR) 150 MG 24 hr capsule TAKE 2 CAPSULES BY MOUTH ONCE DAILY (NEEDS to follow-up with psychiatry for further refills) 60 capsule 0   No current facility-administered medications on file prior to visit.     ROS see history of present illness  Objective  Physical Exam Vitals:   07/06/22 0900  BP: 120/78  Pulse: 76  Temp: 98.8 F (37.1 C)  SpO2: 99%     BP Readings from Last 3 Encounters:  07/06/22 120/78  05/28/22 (!) 146/78  05/11/22 110/70   Wt Readings from Last 3 Encounters:  07/06/22 131 lb 6.4 oz (59.6 kg)  06/27/22 129 lb (58.5 kg)  05/28/22 130 lb (59 kg)    Physical Exam Constitutional:      General: She is not in acute distress.    Appearance: She is not diaphoretic.  Cardiovascular:     Rate and Rhythm: Normal rate and regular rhythm.     Heart sounds: Normal heart sounds.  Pulmonary:     Effort: Pulmonary effort is normal.     Breath sounds: Normal breath sounds.  Musculoskeletal:     Right lower leg: No edema.     Left lower leg: No edema.  Skin:    General: Skin is warm and dry.  Neurological:     Mental Status: She is alert.      Assessment/Plan: Please see individual problem list.  Problem List Items Addressed This Visit     Anxiety (Chronic)    Chronic ongoing issue.  Her medications are managed by psychiatry.  Advised that if she ever develops suicidal thoughts with plan or intent she needs to seek medical attention in the emergency department.      Relevant Medications   LORazepam (ATIVAN) 1 MG tablet   Chronic systolic CHF (congestive heart failure) (HCC) (Chronic)    Appears euvolemic today.  I advised her to minimize salt intake.  She will continue valsartan 80 mg twice a day, Imdur 30 mg daily, Lasix 20 mg every other day, [name] 10 mg daily, and carvedilol 12.5 mg twice daily.      Relevant Orders   Comp Met (CMET)   DM type 2 (diabetes mellitus, type 2) (HCC) (Chronic)    A1c in the normal range when checked in July.  She will continue [name] 10 mg daily.      Relevant Orders   Urine Microalbumin w/creat. ratio   Essential hypertension (Chronic)    Well-controlled today.  She will continue valsartan 80 mg twice a day, Imdur 30 mg daily, Lasix 20 mg every other day, [name] 10 mg daily, and carvedilol 12.5 mg twice daily.      Relevant Orders   Comp Met (CMET)   COVID-19     Patient has recovered well from this.      Vitamin D deficiency    Recheck vitamin D.      Relevant Orders   Vitamin D (25 hydroxy)    Return in about 3 months (around 10/05/2022).   Tommi Rumps, MD [facility] -  [employer]

## 2022-07-06 NOTE — Assessment & Plan Note (Signed)
Patient has recovered well from this.

## 2022-07-06 NOTE — Assessment & Plan Note (Signed)
Well-controlled today.  She will continue valsartan 80 mg twice a day, Imdur 30 mg daily, Lasix 20 mg every other day, [name] 10 mg daily, and carvedilol 12.5 mg twice daily.

## 2022-07-06 NOTE — Assessment & Plan Note (Signed)
Appears euvolemic today.  I advised her to minimize salt intake.  She will continue valsartan 80 mg twice a day, Imdur 30 mg daily, Lasix 20 mg every other day, [name] 10 mg daily, and carvedilol 12.5 mg twice daily.

## 2022-07-06 NOTE — Assessment & Plan Note (Signed)
Recheck vitamin D

## 2022-07-09 LAB — CUP PACEART REMOTE DEVICE CHECK
Battery Remaining Longevity: 79 mo
Battery Remaining Percentage: 87 %
Battery Voltage: 3.07 V
Brady Statistic AP VP Percent: 94 %
Brady Statistic AP VS Percent: 1 %
Brady Statistic AS VP Percent: 5.6 %
Brady Statistic AS VS Percent: 1 %
Brady Statistic RA Percent Paced: 93 %
Date Time Interrogation Session: 20230901020023
HighPow Impedance: 50 Ohm
HighPow Impedance: 50 Ohm
Implantable Lead Implant Date: 20230228
Implantable Lead Implant Date: 20230228
Implantable Lead Implant Date: 20230228
Implantable Lead Location: 753858
Implantable Lead Location: 753859
Implantable Lead Location: 753860
Implantable Lead Model: 3830
Implantable Pulse Generator Implant Date: 20230228
Lead Channel Impedance Value: 410 Ohm
Lead Channel Impedance Value: 410 Ohm
Lead Channel Impedance Value: 600 Ohm
Lead Channel Pacing Threshold Amplitude: 0.375 V
Lead Channel Pacing Threshold Amplitude: 0.625 V
Lead Channel Pacing Threshold Amplitude: 0.75 V
Lead Channel Pacing Threshold Pulse Width: 0.4 ms
Lead Channel Pacing Threshold Pulse Width: 0.5 ms
Lead Channel Pacing Threshold Pulse Width: 0.5 ms
Lead Channel Sensing Intrinsic Amplitude: 12 mV
Lead Channel Sensing Intrinsic Amplitude: 5 mV
Lead Channel Setting Pacing Amplitude: 0.25 V
Lead Channel Setting Pacing Amplitude: 2 V
Lead Channel Setting Pacing Amplitude: 2 V
Lead Channel Setting Pacing Pulse Width: 0.05 ms
Lead Channel Setting Pacing Pulse Width: 0.5 ms
Lead Channel Setting Sensing Sensitivity: 0.5 mV
Pulse Gen Serial Number: 8904264

## 2022-07-14 DIAGNOSIS — M109 Gout, unspecified: Secondary | ICD-10-CM | POA: Diagnosis not present

## 2022-07-17 ENCOUNTER — Ambulatory Visit (HOSPITAL_COMMUNITY)
Admission: RE | Admit: 2022-07-17 | Discharge: 2022-07-17 | Disposition: A | Discharge status: Home / Self Care | Payer: Medicare Other | Source: Ambulatory Visit | Attending: Family Medicine | Admitting: Family Medicine

## 2022-07-17 ENCOUNTER — Ambulatory Visit (HOSPITAL_BASED_OUTPATIENT_CLINIC_OR_DEPARTMENT_OTHER)
Admission: RE | Admit: 2022-07-17 | Discharge: 2022-07-17 | Disposition: A | Discharge status: Home / Self Care | Payer: Medicare Other | Source: Ambulatory Visit | Attending: Internal Medicine | Admitting: Internal Medicine

## 2022-07-17 ENCOUNTER — Other Ambulatory Visit (HOSPITAL_COMMUNITY): Payer: Self-pay

## 2022-07-17 ENCOUNTER — Telehealth: Payer: Self-pay

## 2022-07-17 ENCOUNTER — Encounter (HOSPITAL_COMMUNITY): Payer: Self-pay | Admitting: Internal Medicine

## 2022-07-17 ENCOUNTER — Other Ambulatory Visit (HOSPITAL_COMMUNITY): Payer: Self-pay | Admitting: *Deleted

## 2022-07-17 VITALS — BP 140/70 | HR 77 | Wt 132.2 lb

## 2022-07-17 DIAGNOSIS — I5022 Chronic systolic (congestive) heart failure: Secondary | ICD-10-CM

## 2022-07-17 DIAGNOSIS — G4733 Obstructive sleep apnea (adult) (pediatric): Secondary | ICD-10-CM | POA: Insufficient documentation

## 2022-07-17 DIAGNOSIS — M109 Gout, unspecified: Secondary | ICD-10-CM | POA: Insufficient documentation

## 2022-07-17 DIAGNOSIS — Z955 Presence of coronary angioplasty implant and graft: Secondary | ICD-10-CM | POA: Insufficient documentation

## 2022-07-17 DIAGNOSIS — I447 Left bundle-branch block, unspecified: Secondary | ICD-10-CM | POA: Insufficient documentation

## 2022-07-17 DIAGNOSIS — I517 Cardiomegaly: Secondary | ICD-10-CM | POA: Diagnosis not present

## 2022-07-17 DIAGNOSIS — I252 Old myocardial infarction: Secondary | ICD-10-CM | POA: Diagnosis not present

## 2022-07-17 DIAGNOSIS — E785 Hyperlipidemia, unspecified: Secondary | ICD-10-CM | POA: Insufficient documentation

## 2022-07-17 DIAGNOSIS — E119 Type 2 diabetes mellitus without complications: Secondary | ICD-10-CM | POA: Insufficient documentation

## 2022-07-17 DIAGNOSIS — I340 Nonrheumatic mitral (valve) insufficiency: Secondary | ICD-10-CM | POA: Diagnosis not present

## 2022-07-17 DIAGNOSIS — I110 Hypertensive heart disease with heart failure: Secondary | ICD-10-CM | POA: Insufficient documentation

## 2022-07-17 DIAGNOSIS — K760 Fatty (change of) liver, not elsewhere classified: Secondary | ICD-10-CM | POA: Diagnosis not present

## 2022-07-17 DIAGNOSIS — I2510 Atherosclerotic heart disease of native coronary artery without angina pectoris: Secondary | ICD-10-CM | POA: Insufficient documentation

## 2022-07-17 DIAGNOSIS — I255 Ischemic cardiomyopathy: Secondary | ICD-10-CM | POA: Insufficient documentation

## 2022-07-17 DIAGNOSIS — Z7901 Long term (current) use of anticoagulants: Secondary | ICD-10-CM | POA: Diagnosis not present

## 2022-07-17 DIAGNOSIS — I083 Combined rheumatic disorders of mitral, aortic and tricuspid valves: Secondary | ICD-10-CM

## 2022-07-17 DIAGNOSIS — I080 Rheumatic disorders of both mitral and aortic valves: Secondary | ICD-10-CM | POA: Insufficient documentation

## 2022-07-17 DIAGNOSIS — I6503 Occlusion and stenosis of bilateral vertebral arteries: Secondary | ICD-10-CM | POA: Diagnosis not present

## 2022-07-17 DIAGNOSIS — Z79899 Other long term (current) drug therapy: Secondary | ICD-10-CM | POA: Diagnosis not present

## 2022-07-17 DIAGNOSIS — Z8673 Personal history of transient ischemic attack (TIA), and cerebral infarction without residual deficits: Secondary | ICD-10-CM | POA: Insufficient documentation

## 2022-07-17 DIAGNOSIS — I5030 Unspecified diastolic (congestive) heart failure: Secondary | ICD-10-CM

## 2022-07-17 DIAGNOSIS — R42 Dizziness and giddiness: Secondary | ICD-10-CM | POA: Insufficient documentation

## 2022-07-17 LAB — CBC
HCT: 35.1 % — ABNORMAL LOW (ref 36.0–46.0)
Hemoglobin: 11.3 g/dL — ABNORMAL LOW (ref 12.0–15.0)
MCH: 26.9 pg (ref 26.0–34.0)
MCHC: 32.2 g/dL (ref 30.0–36.0)
MCV: 83.6 fL (ref 80.0–100.0)
Platelets: 245 10*3/uL (ref 150–400)
RBC: 4.2 MIL/uL (ref 3.87–5.11)
RDW: 14.8 % (ref 11.5–15.5)
WBC: 6.2 10*3/uL (ref 4.0–10.5)
nRBC: 0 % (ref 0.0–0.2)

## 2022-07-17 LAB — ECHOCARDIOGRAM COMPLETE
Area-P 1/2: 4.39 cm2
Calc EF: 25.4 %
MV M vel: 5.68 m/s
MV Peak grad: 129 mmHg
P 1/2 time: 318 msec
Radius: 0.4 cm
S' Lateral: 4.7 cm
Single Plane A2C EF: 24 %
Single Plane A4C EF: 30.3 %

## 2022-07-17 LAB — BASIC METABOLIC PANEL
Anion gap: 6 (ref 5–15)
BUN: 19 mg/dL (ref 8–23)
CO2: 22 mmol/L (ref 22–32)
Calcium: 9.6 mg/dL (ref 8.9–10.3)
Chloride: 113 mmol/L — ABNORMAL HIGH (ref 98–111)
Creatinine, Ser: 1.13 mg/dL — ABNORMAL HIGH (ref 0.44–1.00)
GFR, Estimated: 52 mL/min — ABNORMAL LOW (ref >60)
Glucose, Bld: 71 mg/dL (ref 70–99)
Potassium: 3.7 mmol/L (ref 3.5–5.1)
Sodium: 141 mmol/L (ref 135–145)

## 2022-07-17 LAB — BRAIN NATRIURETIC PEPTIDE: B Natriuretic Peptide: 2184.7 pg/mL — ABNORMAL HIGH (ref 0.0–100.0)

## 2022-07-17 NOTE — Telephone Encounter (Signed)
I called and informed the patient of her normal lab results and she understood.  Melanie Ray,cma

## 2022-07-17 NOTE — Telephone Encounter (Signed)
Patient states she recently had lab work done and would like to know her results as soon as we have them.  Patient also states she was referred to a neurologist and has not heard from anyone.  I let patient know that the referral was sent to [facility] in [place] and gave her their phone number.

## 2022-07-17 NOTE — H&P (View-Only) (Signed)
ADVANCED HF CLINIC NOTE  Primary Care: Leone Haven, MD Primary Cardiologist: Sherren Mocha, MD HF Cardiologist: Dr. Haroldine Laws  HPI: [name] is a 71 y.o. female with the following medical issues. She was referred by Dr. Burt Knack for further assessment and management of her HF.  Coronary artery disease  S/p Anterior STEMI 5/17 >> PCI: DES x 2 to LAD Myoview 7/21: EF 20, no ischemia Heart failure with reduced ejection fraction   Ischemic CM EF 30-35 at time of MI 5/17 >> DC on Lifevest >> Pt DC'd at FU visit Echocardiogram 8/17: EF 25-30 Echocardiogram 3/18: EF 30-35 Echocardiogram 8/19: EF 30-35 EP eval (J Allred) x 2 >> pt declined CRT-D Intol of Entresto Echocardiogram 03/2020: EF < 20% Myoview 7/21 EF 20% no ischemia Obsessive Compulsive d/o Diabetes mellitus 2 Hypertension  Hyperlipidemia  LBBB OSA Hepatic steatosis  Hx of cerebellar CVA 03/2020 Bilateral vertebral artery stenosis   We saw her for the first time in 3/22. Referred for cMRI to assess degree of LAD scar.  cMRI 1/23 1. Moderately dilated LV with diffuse hypokinesis and septal-lateral dyssynchrony consistent with LBBB. EF 17%. 2.  Normal RV size and systolic function, EF 16%. 3. At least moderate MR, likely functional with posterior leaflet restriction. Unable to quantify as flow sequences not done. 4.  No significant LGE noted. 5. With lack of LGE, LBBB cardiomyopathy may be a major contributor to low EF.  Was admitted 12/22 with HF flare. Was not taking lasix regularly. Also ate a lot of salt over Xmas.    CRT-D placed 01/02/22 with LBBB lead by Dr. Quentin Ore.   Admitted 7/23 with a/c CHF due to noncompliance with Lasix. Diuresed with IV lasix, transitioned to po Lasix 10 mg daily. Discharged home, weight 127 lbs.  Echo (7/23) showed EF 20-25%, global LV HK, RV ok, moderate MR.  Echo today 07/17/22 EF 20-25% G2DD, RV ok. Severe central MR Personally reviewed  Today she  returns for f/u. Says she is not doing well. Since the summer has been really struggling with her memory. Also in past 2 weeks has been struggling with her vision but says she can still drive. Says breathing is fine. Days she is dizzy and stumbles but denies SOB, orthopnea or PND. No edema. Having a gout flare currently. Has been compliant with meds.   ICD interrogation: No VT/VF. > 99% biv pacing. Volume ok. No AF. Personally reviewed    Cardiac Studies: - Echo (7/23) showed EF 20-25%, global LV HK, RV ok, moderate MR.  - CRT-D with LBBB lead (2/23)  - cMRI (1/23): LVEF 17%, RVEF 59%, moderate MR, no LGE, moderately dilated LV with diffuse hypokinesis and septal-lateral dyssynchrony consistent with LBBB.  - Myoview (7/21): EF 20% no ischemia  Past Medical History:  Diagnosis Date   Anemia    Anxiety    Back pain    CAD (coronary artery disease) 04/11/2016   S/p ant STEMI 5/17: LHC >> LAD proximal 80%, mid 80%, distal 50%, ostial D1 60%; LCx with LPDA lesion 30%; RCA Mild calcification with no significant stenosis in a medium caliber, nondominant RCA; LVEF is estimated at 45% with inferoapical and lateral wall akinesis >> PCI: PCI: 3.5 x 24 mm Promus DES to prox LAD, 2.5 x 12 mm Promus DES to mid LAD. // Myoview 7/21: EF 20, no ischemia; high risk    Chest pain    Chronic systolic CHF (congestive heart failure) ([place]) 03/21/2016   Echo 01/30/17:  Diff HK, mild focal basal septal hypertrophy, EF 30-35, mild AI, MAC, mild MR // Echo 06/08/16: Mild focal basal septal hypertrophy, EF 25-30%, diff HK, ant-septal AK, Gr 1 DD, mild AI, MAC, mild MR, PASP 37 mmHg // Echo 03/18/16: EF 30-35%, ant-septal AK, Gr 1 DD, mild MR, severe LAE.   Constipation    Depression    Diabetes mellitus type 2 in obese (HCC)    Dizziness    Glaucoma    Gout    Heart disease    Heartburn    History of acute anterior wall MI 03/17/2016   History of heart attack    Hyperlipemia    Hypertension    Ischemic  cardiomyopathy 10/02/2016   Refused ICD   Joint pain    Left bundle branch block    Myocardial infarction (HCC)    Nausea    Positive colorectal cancer screening using Cologuard test 03/01/2020   Sleep apnea    SOB (shortness of breath)    Suicidal ideation 08/27/2018   Swelling    feet or legs   Current Outpatient Medications  Medication Sig Dispense Refill   atorvastatin (LIPITOR) 80 MG tablet Take 1 tablet by mouth once daily 90 tablet 3   benzonatate (TESSALON) 200 MG capsule Take 200 mg by mouth 3 (three) times daily as needed.     brimonidine (ALPHAGAN) 0.2 % ophthalmic solution SMARTSIG:In Eye(s)     carvedilol (COREG) 12.5 MG tablet TAKE 1 TABLET BY MOUTH TWICE DAILY WITH MEALS 60 tablet 9   clopidogrel (PLAVIX) 75 MG tablet Take 1 tablet by mouth once daily 90 tablet 0   dorzolamide-timolol (COSOPT) 22.3-6.8 MG/ML ophthalmic solution Place 1 drop into both eyes 2 (two) times daily.      furosemide (LASIX) 20 MG tablet Take 20 mg by mouth every other day.     isosorbide mononitrate (IMDUR) 30 MG 24 hr tablet Take 1 tablet by mouth once daily 90 tablet 0   latanoprost (XALATAN) 0.005 % ophthalmic solution Place 1 drop into both eyes at bedtime.     LORazepam (ATIVAN) 1 MG tablet Take 1 mg by mouth at bedtime as needed.     lumateperone tosylate (CAPLYTA) 42 MG capsule Take 42 mg by mouth daily.     nitroGLYCERIN (NITROSTAT) 0.4 MG SL tablet DISSOLVE ONE TABLET UNDER THE TONGUE EVERY FIVE MINUTES AS NEEDED FOR CHEST PAIN. DO NOT EXCEED A TOTAL OF THREE DOSES IN 15 MINUTES 25 tablet 4   traZODone (DESYREL) 150 MG tablet Take 150 mg by mouth at bedtime.     valsartan (DIOVAN) 80 MG tablet Take 80 mg by mouth daily.     venlafaxine XR (EFFEXOR-XR) 150 MG 24 hr capsule TAKE 2 CAPSULES BY MOUTH ONCE DAILY (NEEDS to follow-up with psychiatry for further refills) 60 capsule 0   No current facility-administered medications for this encounter.   Allergies  Allergen Reactions    Tetracycline Swelling   Meperidine Nausea And Vomiting    Nausea   Entresto [Sacubitril-Valsartan] Other (See Comments)    Shortness of Breath   Social History   Socioeconomic History   Marital status: Married    Spouse name: Sona Nations   Number of children: 0   Years of education: BS in education   Highest education level: Not on file  Occupational History   Occupation:  Retired Education officer, museum  Tobacco Use   Smoking status: Former    Types: Cigarettes    Quit date: 04/13/1997  Years since quitting: 25.2   Smokeless tobacco: Never  Vaping Use   Vaping Use: Never used  Substance and Sexual Activity   Alcohol use: Not Currently   Drug use: No   Sexual activity: Not Currently  Other Topics Concern   Not on file  Social History Narrative   Lives in [place] with spouse.  No children.   Retired first [employer] for over 30 years ([employer] for 10 years and then in [place] for over 20 years).   Left-handed   Lives in a two story home       Social Determinants of Health   Financial Resource Strain: Low Risk  (03/30/2022)   Overall Financial Resource Strain (CARDIA)    Difficulty of Paying Living Expenses: Not hard at all  Food Insecurity: No Food Insecurity (03/30/2022)   Hunger Vital Sign    Worried About Running Out of Food in the Last Year: Never true    Ran Out of Food in the Last Year: Never true  Transportation Needs: No Transportation Needs (03/30/2022)   PRAPARE - Hydrologist (Medical): No    Lack of Transportation (Non-Medical): No  Physical Activity: Not on file  Stress: No Stress Concern Present (03/30/2022)   [place]    Feeling of Stress : Not at all  Social Connections: Unknown (03/30/2022)   Social Connection and Isolation Panel [NHANES]    Frequency of Communication with Friends and Family: Not on file    Frequency of Social Gatherings with  Friends and Family: Not on file    Attends Religious Services: Not on file    Active Member of Clubs or Organizations: Not on file    Attends Archivist Meetings: Not on file    Marital Status: Married  Intimate Partner Violence: Not At Risk (03/30/2022)   Humiliation, Afraid, Rape, and Kick questionnaire    Fear of Current or Ex-Partner: No    Emotionally Abused: No    Physically Abused: No    Sexually Abused: No    Family History  Problem Relation Age of Onset   Anxiety disorder Mother    Paranoid behavior Mother    Hypertension Mother    Dementia Mother    High Cholesterol Mother    Diabetes Mother    Hyperlipidemia Mother    Depression Mother    Hypertension Father    High Cholesterol Father    Mood Disorder Sister    Stroke Sister    Anxiety disorder Maternal Aunt    Tuberculosis Paternal Grandfather    Drug abuse Cousin    BP (!) 140/70   Pulse 77   Wt 60 kg (132 lb 3.2 oz)   SpO2 99%   BMI 23.42 kg/m   Wt Readings from Last 3 Encounters:  07/17/22 60 kg (132 lb 3.2 oz)  07/06/22 59.6 kg (131 lb 6.4 oz)  06/27/22 58.5 kg (129 lb)   PHYSICAL EXAM: General: Elderly woman No resp difficulty HEENT: normal Neck: supple. no JVD. Carotids 2+ bilat; no bruits. No lymphadenopathy or thryomegaly appreciated. Cor: PMI nondisplaced. Regular rate & rhythm. 2/6 MR Lungs: clear Abdomen: soft, nontender, nondistended. No hepatosplenomegaly. No bruits or masses. Good bowel sounds. Extremities: no cyanosis, clubbing, rash, edema Neuro: alert & orientedx3, cranial nerves grossly intact. moves all 4 extremities w/o difficulty. Affect pleasant   ASSESSMENT & PLAN: 1. Chronic systolic HF - initially felt due to ischemic CM. S/p  Anterior STEMI 5/17  - Echo (8/19): EF 30-35%. - Echo 5/21 EF < 20%.   - cMRI 1/23 EF 15% No LGE. RV ok. Severe LV dyssynchrony Moderate MR  - CRT-D placed 01/02/22 with LBBB lead by Dr. Quentin Ore - Echo (7/23): EF 20-25%, RV ok -  Echo  today 07/17/22 EF 20-25% G2DD, RV ok. Severe central MR Personally reviewed - Stable NYHA II - Change Lasix to 20 mg PRN. - Stopped [name] due to cost. Will have Pharmacist see her today to see if we can get her on an SGLt2i  - Continue carvedilol 12.5 mg bid. - Continue valsartan 80 mg bid (could not afford Entresto and made her SOB) - Continue Imdur 30 mg daily. - Off spiro due to AKI - EF has not improved with CRT.  - Will plan R/L cath and TEE to start MV Clip w/u   2. Coronary artery disease involving native coronary artery of native heart without angina pectoris - Hx of anterior STEMI in 5/17 tx with DES x 2 to the LAD.   - Myoview (7/21)  EF 20% no ischemia.   - No LGE on cMRI (1/23) - No s/s angina - Lipid management per PCP. Goal LDL < 70   3. Essential hypertension - Mildly elevated today. - Meds as above.  4. LBBB - s/p CRT-D 3/23 with Dr. Quentin Ore -. EF has not improved   5. Mitral regurgitation - Severe on echo today. Not improved with CRT - Will plan R/L cath and TEE to start MV Clip w/u  Glori Bickers, MD  2:30 PM

## 2022-07-17 NOTE — Patient Instructions (Signed)
Labs done today, your results will be available in MyChart, we will contact you for abnormal readings.  Your physician has requested that you have a TEE. During a TEE, sound waves are used to create images of your heart. It provides your doctor with information about the size and shape of your heart and how well your heart's chambers and valves are working. In this test, a transducer is attached to the end of a flexible tube that's guided down your throat and into your esophagus (the tube leading from you mouth to your stomach) to get a more detailed image of your heart. You are not awake for the procedure. Please see the instruction sheet given to you today. For further information please visit [email or web address].  Your physician has requested that you have a cardiac catheterization. Cardiac catheterization is used to diagnose and/or treat various heart conditions. Doctors may recommend this procedure for a number of different reasons. The most common reason is to evaluate chest pain. Chest pain can be a symptom of coronary artery disease (CAD), and cardiac catheterization can show whether plaque is narrowing or blocking your heart's arteries. This procedure is also used to evaluate the valves, as well as measure the blood flow and oxygen levels in different parts of your heart. For further information please visit [email or web address]. Please follow instruction sheet, as given.  Your physician recommends that you schedule a follow-up appointment in: 4 months   If you have any questions or concerns before your next appointment please send [name] a message through [place] or call our office at [phone].    TO LEAVE A MESSAGE FOR THE NURSE SELECT OPTION 2, PLEASE LEAVE A MESSAGE INCLUDING: YOUR NAME DATE OF BIRTH CALL BACK NUMBER REASON FOR CALL**this is important as we prioritize the call backs  YOU WILL RECEIVE A CALL BACK THE SAME DAY AS LONG AS YOU CALL BEFORE 4:00 PM  At the [facility], you and your health needs are our priority. As part of our continuing mission to provide you with exceptional heart care, we have created designated Provider Care Teams. These Care Teams include your primary Cardiologist (physician) and Advanced Practice Providers (APPs- Physician Assistants and Nurse Practitioners) who all work together to provide you with the care you need, when you need it.   You may see any of the following providers on your designated Care Team at your next follow up: Dr Glori Bickers Dr Loralie Champagne Dr. Roxana Hires, NP Lyda Jester, [place] [facility] [place], [place] Forestine Na, NP Audry Riles, PharmD   Please be sure to bring in all your medications bottles to every appointment.     INSTRUCTIONS FOR TEE AND HEART CATH:   You are scheduled for a TEE and Cardiac Catheterization on Thursday, September 28 with Dr. Glori Bickers.  1. Please arrive at the [facility] (Main Entrance A) at [facility]: [address] [place], [place] [address] at 8:00 AM (This time is two hours before your procedure to ensure your preparation). Free valet parking service is available.   Special note: Every effort is made to have your procedure done on time. Please understand that emergencies sometimes delay scheduled procedures.  2. Diet: Do not eat solid foods after midnight.  The patient may have clear liquids until 5am upon the day of the procedure.  3. Labs: DONE TODAY  4. Medication instructions in preparation for your procedure:   THURSDAY 08/02/22 AM DO NOT TAKE FUROSEMIDE  On the morning of your procedure, take your Plavix/Clopidogrel and any morning medicines NOT listed above.  You may use sips of water.  5. Plan for one night stay--bring personal belongings. 6. Bring a current list of your medications and current insurance cards. 7. You MUST have a responsible person to drive you home. 8. Someone MUST be with you  the first 24 hours after you arrive home or your discharge will be delayed. 9. Please wear clothes that are easy to get on and off and wear slip-on shoes.  Thank you for allowing [name] to care for you!   -- [facility]

## 2022-07-17 NOTE — Progress Notes (Signed)
Echocardiogram 2D Echocardiogram has been performed.  Melanie Ray 07/17/2022, 1:57 PM

## 2022-07-17 NOTE — Progress Notes (Signed)
 ADVANCED HF CLINIC NOTE  Primary Care: Sonnenberg, Eric G, MD Primary Cardiologist: Michael Cooper, MD HF Cardiologist: Dr. Shantanu Strauch  HPI: Melanie Ray is a 71 y.o. female with the following medical issues. She was referred by Dr. Cooper for further assessment and management of her HF.  Coronary artery disease  S/p Anterior STEMI 5/17 >> PCI: DES x 2 to LAD Myoview 7/21: EF 20, no ischemia Heart failure with reduced ejection fraction   Ischemic CM EF 30-35 at time of MI 5/17 >> DC on [facility] >> Pt DC'd at FU visit Echocardiogram 8/17: EF 25-30 Echocardiogram 3/18: EF 30-35 Echocardiogram 8/19: EF 30-35 EP eval (J Allred) x 2 >> pt declined CRT-D Intol of Entresto Echocardiogram 03/2020: EF < 20% Myoview 7/21 EF 20% no ischemia Obsessive Compulsive d/o Diabetes mellitus 2 Hypertension  Hyperlipidemia  LBBB OSA Hepatic steatosis  Hx of cerebellar CVA 03/2020 Bilateral vertebral artery stenosis   We saw her for the first time in 3/22. Referred for cMRI to assess degree of LAD scar.  cMRI 1/23 1. Moderately dilated LV with diffuse hypokinesis and septal-lateral dyssynchrony consistent with LBBB. EF 17%. 2.  Normal RV size and systolic function, EF 59%. 3. At least moderate MR, likely functional with posterior leaflet restriction. Unable to quantify as flow sequences not done. 4.  No significant LGE noted. 5. With lack of LGE, LBBB cardiomyopathy may be a major contributor to low EF.  Was admitted 12/22 with HF flare. Was not taking lasix regularly. Also ate a lot of salt over Xmas.    CRT-D placed 01/02/22 with LBBB lead by Dr. Lambert.   Admitted 7/23 with a/c CHF due to noncompliance with Lasix. Diuresed with IV lasix, transitioned to po Lasix 10 mg daily. Discharged home, weight 127 lbs.  Echo (7/23) showed EF 20-25%, global LV HK, RV ok, moderate MR.  Echo today 07/17/22 EF 20-25% G2DD, RV ok. Severe central MR Personally reviewed  Today she  returns for f/u. Says she is not doing well. Since the summer has been really struggling with her memory. Also in past 2 weeks has been struggling with her vision but says she can still drive. Says breathing is fine. Days she is dizzy and stumbles but denies SOB, orthopnea or PND. No edema. Having a gout flare currently. Has been compliant with meds.   ICD interrogation: No VT/VF. > 99% biv pacing. Volume ok. No AF. Personally reviewed    Cardiac Studies: - Echo (7/23) showed EF 20-25%, global LV HK, RV ok, moderate MR.  - CRT-D with LBBB lead (2/23)  - cMRI (1/23): LVEF 17%, RVEF 59%, moderate MR, no LGE, moderately dilated LV with diffuse hypokinesis and septal-lateral dyssynchrony consistent with LBBB.  - Myoview (7/21): EF 20% no ischemia  Past Medical History:  Diagnosis Date   Anemia    Anxiety    Back pain    CAD (coronary artery disease) 04/11/2016   S/p ant STEMI 5/17: LHC >> LAD proximal 80%, mid 80%, distal 50%, ostial D1 60%; LCx with LPDA lesion 30%; RCA Mild calcification with no significant stenosis in a medium caliber, nondominant RCA; LVEF is estimated at 45% with inferoapical and lateral wall akinesis >> PCI: PCI: 3.5 x 24 mm Promus DES to prox LAD, 2.5 x 12 mm Promus DES to mid LAD. // Myoview 7/21: EF 20, no ischemia; high risk    Chest pain    Chronic systolic CHF (congestive heart failure) (HCC) 03/21/2016   Echo 01/30/17:   Diff HK, mild focal basal septal hypertrophy, EF 30-35, mild AI, MAC, mild MR // Echo 06/08/16: Mild focal basal septal hypertrophy, EF 25-30%, diff HK, ant-septal AK, Gr 1 DD, mild AI, MAC, mild MR, PASP 37 mmHg // Echo 03/18/16: EF 30-35%, ant-septal AK, Gr 1 DD, mild MR, severe LAE.   Constipation    Depression    Diabetes mellitus type 2 in obese (HCC)    Dizziness    Glaucoma    Gout    Heart disease    Heartburn    History of acute anterior wall MI 03/17/2016   History of heart attack    Hyperlipemia    Hypertension    Ischemic  cardiomyopathy 10/02/2016   Refused ICD   Joint pain    Left bundle branch block    Myocardial infarction (HCC)    Nausea    Positive colorectal cancer screening using Cologuard test 03/01/2020   Sleep apnea    SOB (shortness of breath)    Suicidal ideation 08/27/2018   Swelling    feet or legs   Current Outpatient Medications  Medication Sig Dispense Refill   atorvastatin (LIPITOR) 80 MG tablet Take 1 tablet by mouth once daily 90 tablet 3   benzonatate (TESSALON) 200 MG capsule Take 200 mg by mouth 3 (three) times daily as needed.     brimonidine (ALPHAGAN) 0.2 % ophthalmic solution SMARTSIG:In Eye(s)     carvedilol (COREG) 12.5 MG tablet TAKE 1 TABLET BY MOUTH TWICE DAILY WITH MEALS 60 tablet 9   clopidogrel (PLAVIX) 75 MG tablet Take 1 tablet by mouth once daily 90 tablet 0   dorzolamide-timolol (COSOPT) 22.3-6.8 MG/ML ophthalmic solution Place 1 drop into both eyes 2 (two) times daily.      furosemide (LASIX) 20 MG tablet Take 20 mg by mouth every other day.     isosorbide mononitrate (IMDUR) 30 MG 24 hr tablet Take 1 tablet by mouth once daily 90 tablet 0   latanoprost (XALATAN) 0.005 % ophthalmic solution Place 1 drop into both eyes at bedtime.     LORazepam (ATIVAN) 1 MG tablet Take 1 mg by mouth at bedtime as needed.     lumateperone tosylate (CAPLYTA) 42 MG capsule Take 42 mg by mouth daily.     nitroGLYCERIN (NITROSTAT) 0.4 MG SL tablet DISSOLVE ONE TABLET UNDER THE TONGUE EVERY FIVE MINUTES AS NEEDED FOR CHEST PAIN. DO NOT EXCEED A TOTAL OF THREE DOSES IN 15 MINUTES 25 tablet 4   traZODone (DESYREL) 150 MG tablet Take 150 mg by mouth at bedtime.     valsartan (DIOVAN) 80 MG tablet Take 80 mg by mouth daily.     venlafaxine XR (EFFEXOR-XR) 150 MG 24 hr capsule TAKE 2 CAPSULES BY MOUTH ONCE DAILY (NEEDS to follow-up with psychiatry for further refills) 60 capsule 0   No current facility-administered medications for this encounter.   Allergies  Allergen Reactions    Tetracycline Swelling   Meperidine Nausea And Vomiting    Nausea   Entresto [Sacubitril-Valsartan] Other (See Comments)    Shortness of Breath   Social History   Socioeconomic History   Marital status: Married    Spouse name: Anthony Ellithorpe   Number of children: 0   Years of education: BS in education   Highest education level: Not on file  Occupational History   Occupation:  Retired School teacher  Tobacco Use   Smoking status: Former    Types: Cigarettes    Quit date: 04/13/1997      Years since quitting: 25.2   Smokeless tobacco: Never  Vaping Use   Vaping Use: Never used  Substance and Sexual Activity   Alcohol use: Not Currently   Drug use: No   Sexual activity: Not Currently  Other Topics Concern   Not on file  Social History Narrative   Lives in [place] with spouse.  No children.   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   Left-handed   Lives in a two story home       Social Determinants of Health   Financial Resource Strain: Low Risk  (03/30/2022)   Overall Financial Resource Strain (CARDIA)    Difficulty of Paying Living Expenses: Not hard at all  Food Insecurity: No Food Insecurity (03/30/2022)   Hunger Vital Sign    Worried About Running Out of Food in the Last Year: Never true    Ran Out of Food in the Last Year: Never true  Transportation Needs: No Transportation Needs (03/30/2022)   PRAPARE - Transportation    Lack of Transportation (Medical): No    Lack of Transportation (Non-Medical): No  Physical Activity: Not on file  Stress: No Stress Concern Present (03/30/2022)   [employer] - Occupational Stress Questionnaire    Feeling of Stress : Not at all  Social Connections: Unknown (03/30/2022)   Social Connection and Isolation Panel [NHANES]    Frequency of Communication with Friends and Family: Not on file    Frequency of Social Gatherings with  Friends and Family: Not on file    Attends Religious Services: Not on file    Active Member of Clubs or Organizations: Not on file    Attends Club or Organization Meetings: Not on file    Marital Status: Married  Intimate Partner Violence: Not At Risk (03/30/2022)   Humiliation, Afraid, Rape, and Kick questionnaire    Fear of Current or Ex-Partner: No    Emotionally Abused: No    Physically Abused: No    Sexually Abused: No    Family History  Problem Relation Age of Onset   Anxiety disorder Mother    Paranoid behavior Mother    Hypertension Mother    Dementia Mother    High Cholesterol Mother    Diabetes Mother    Hyperlipidemia Mother    Depression Mother    Hypertension Father    High Cholesterol Father    Mood Disorder Sister    Stroke Sister    Anxiety disorder Maternal Aunt    Tuberculosis Paternal Grandfather    Drug abuse Cousin    BP (!) 140/70   Pulse 77   Wt 60 kg (132 lb 3.2 oz)   SpO2 99%   BMI 23.42 kg/m   Wt Readings from Last 3 Encounters:  07/17/22 60 kg (132 lb 3.2 oz)  07/06/22 59.6 kg (131 lb 6.4 oz)  06/27/22 58.5 kg (129 lb)   PHYSICAL EXAM: General: Elderly woman No resp difficulty HEENT: normal Neck: supple. no JVD. Carotids 2+ bilat; no bruits. No lymphadenopathy or thryomegaly appreciated. Cor: PMI nondisplaced. Regular rate & rhythm. 2/6 MR Lungs: clear Abdomen: soft, nontender, nondistended. No hepatosplenomegaly. No bruits or masses. Good bowel sounds. Extremities: no cyanosis, clubbing, rash, edema Neuro: alert & orientedx3, cranial nerves grossly intact. moves all 4 extremities w/o difficulty. Affect pleasant   ASSESSMENT & PLAN: 1. Chronic systolic HF - initially felt due to ischemic CM. S/p   Anterior STEMI 5/17  - Echo (8/19): EF 30-35%. - Echo 5/21 EF < 20%.   - cMRI 1/23 EF 15% No LGE. RV ok. Severe LV dyssynchrony Moderate MR  - CRT-D placed 01/02/22 with LBBB lead by Dr. Lambert - Echo (7/23): EF 20-25%, RV ok -  Echo  today 07/17/22 EF 20-25% G2DD, RV ok. Severe central MR Personally reviewed - Stable NYHA II - Change Lasix to 20 mg PRN. - Stopped [name] due to cost. Will have Pharmacist see her today to see if we can get her on an [id number]  - Continue carvedilol 12.5 mg bid. - Continue valsartan 80 mg bid (could not afford Entresto and made her SOB) - Continue Imdur 30 mg daily. - Off spiro due to AKI - EF has not improved with CRT.  - Will plan R/L cath and TEE to start MV Clip w/u   2. Coronary artery disease involving native coronary artery of native heart without angina pectoris - Hx of anterior STEMI in 5/17 tx with DES x 2 to the LAD.   - Myoview (7/21)  EF 20% no ischemia.   - No LGE on cMRI (1/23) - No s/s angina - Lipid management per PCP. Goal LDL < 70   3. Essential hypertension - Mildly elevated today. - Meds as above.  4. LBBB - s/p CRT-D 3/23 with Dr. Lambert -. EF has not improved   5. Mitral regurgitation - Severe on echo today. Not improved with CRT - Will plan R/L cath and TEE to start MV Clip w/u  Driana Dazey, MD  2:30 PM 

## 2022-07-18 ENCOUNTER — Telehealth: Payer: Self-pay

## 2022-07-18 NOTE — Telephone Encounter (Signed)
Scheduled the patient with Dr. Burt Knack 08/08/22. She was grateful for call and agrees with plan.

## 2022-07-18 NOTE — Telephone Encounter (Signed)
Per Dr. Haroldine Laws, called to arrange MitraClip consult with Dr. Burt Knack after cath/TEE 9/28.  The patient stated she was on an important call and would call back later.

## 2022-07-24 NOTE — Progress Notes (Unsigned)
Follow-up Visit   Date: 07/25/2022    Melanie Ray MRN: [id number] DOB: 1951-09-01    Melanie Ray is a 71 y.o. left-handed Caucasian female with diabetes mellitus, ischemic cardiomyopathy, CHF, CAD, OSA, history of cerebellar stroke (2021) returning to the clinic for follow-up of dizziness and new complains of memory loss.  The patient was accompanied to the clinic by self.    IMPRESSION/PLAN: Memory impairment most likely due to severe depression.  Cognitive screening shows deficits in immediate recall and naming only.  MRI brain from 2022 shows mild generalized atrophy.  Overlapping dementia cannot be excluded, but given the acute worsening of memory over the summer, I suspect this is more related to severe depression  - Repeat MRI brain wo contrast   - Formal neuropsychological testing  - Advised against taking her husband's memantine.    - I do not recommend medication for memory as my clinical suspicion is that most of her acute memory changes is due to severe depression, not primary dementia.  She is scheduled to see psychiatrist next week.   2.  Chronic dizziness of unclear etiology.  Spells are short-lasting and self-limiting within 5-10 minutes.  No evidence of central vertigo.  She has completed vestibular therapy.  No role for medications as spells self-resolve.    Further recommendations pending results.   --------------------------------------------- History of present illness: Starting around ~2014, she began having intermittent spells of dizziness, as the room is spinning.  Sometimes, she feels as if she is spinning.  Over the years, the spells have become more frequent. Over the past 6 months, it occurs daily, lasting 10-15 minutes, which recurs throughout the day. It is improved with rest, such as sitting or laying down.  It is worse with sudden movements, such as standing up too quickly or quick head turns.  She has associated imbalance and had a  fall in October fracturing her left 5th digit.  No nausea/vomiting, headache, or weakness.  Previously evaluated by Dr. Jaynee Eagles at [facility] in 2018 and more recently seeing Dr. Hope Budds, in [place]. She was referred for vestubular therapy but had not appreciated much improvement. Per patient, her ENT testing indicated she has vertigo.  She has not followed up with them since completing therapy.   Neurological testing has included MRI/A head (2021) which shows age-indeterminate small left cerebellarstroke and bilateral vertebral artery stenosis which becomes patent to the basilar. MRI head from 2018 was unremarkable. She denies constant sensation of spinning, weakness, imbalance, or numbness/tingling.  UPDATE 07/25/2022:  Over the past month, she has become increasingly forgetful and getting lost.  She forgets to take her own medications, administer medications to her husband with dementia, or give their dog medications.  She drives and has not been involved in accidents, but admits to getting lost in familiar places.  During the same time, she reports that her vision and hearing has also reduced.  She has started Caplyta '42mg'$  by her psychiatrist in July for severe depression.  She has been more depressed this summer and endorses suicidal ideation "all the time", but denies any plans.  She feels "she'd be better off dead".  She has expressed this with her psychiatrist and is scheduled to see him next week.  She was concerned about dementia so started taking her husband's memantine.  She does have children or social support.   She is able to perform ADLs.    She has daily dizziness/lightheadedness spells lasting about an 5-10 min.  No  associated nausea/vomiting.  She denies spinning sensation.   Medications:  Current Outpatient Medications on File Prior to Visit  Medication Sig Dispense Refill   atorvastatin (LIPITOR) 80 MG tablet Take 1 tablet by mouth once daily 90 tablet 3   benzonatate (TESSALON)  200 MG capsule Take 200 mg by mouth 3 (three) times daily as needed.     brimonidine (ALPHAGAN) 0.2 % ophthalmic solution SMARTSIG:In Eye(s)     carvedilol (COREG) 12.5 MG tablet TAKE 1 TABLET BY MOUTH TWICE DAILY WITH MEALS 60 tablet 9   clopidogrel (PLAVIX) 75 MG tablet Take 1 tablet by mouth once daily 90 tablet 0   dorzolamide-timolol (COSOPT) 22.3-6.8 MG/ML ophthalmic solution Place 1 drop into both eyes 2 (two) times daily.      furosemide (LASIX) 20 MG tablet Take 20 mg by mouth every other day.     isosorbide mononitrate (IMDUR) 30 MG 24 hr tablet Take 1 tablet by mouth once daily 90 tablet 0   latanoprost (XALATAN) 0.005 % ophthalmic solution Place 1 drop into both eyes at bedtime.     LORazepam (ATIVAN) 1 MG tablet Take 1 mg by mouth at bedtime as needed.     lumateperone tosylate (CAPLYTA) 42 MG capsule Take 42 mg by mouth daily.     nitroGLYCERIN (NITROSTAT) 0.4 MG SL tablet DISSOLVE ONE TABLET UNDER THE TONGUE EVERY FIVE MINUTES AS NEEDED FOR CHEST PAIN. DO NOT EXCEED A TOTAL OF THREE DOSES IN 15 MINUTES 25 tablet 4   traZODone (DESYREL) 150 MG tablet Take 150 mg by mouth at bedtime.     valsartan (DIOVAN) 80 MG tablet Take 80 mg by mouth daily.     venlafaxine XR (EFFEXOR-XR) 150 MG 24 hr capsule TAKE 2 CAPSULES BY MOUTH ONCE DAILY (NEEDS to follow-up with psychiatry for further refills) 60 capsule 0   No current facility-administered medications on file prior to visit.    Allergies:  Allergies  Allergen Reactions   Tetracycline Swelling   Meperidine Nausea And Vomiting    Nausea   Entresto [Sacubitril-Valsartan] Other (See Comments)    Shortness of Breath    Vital Signs:  BP (!) 179/78   Pulse 83   Ht '5\' 3"'$  (1.6 m)   Wt 132 lb (59.9 kg)   SpO2 98%   BMI 23.38 kg/m    Neurological Exam: MENTAL STATUS including orientation to time, place, person, recent and remote memory, attention span and concentration, language, and fund of knowledge is intact.  Speech is not  dysarthric.     07/25/2022    9:00 AM  Montreal Cognitive Assessment   Visuospatial/ Executive (0/5) 4  Naming (0/3) 3  Attention: Read list of digits (0/2) 2  Attention: Read list of letters (0/1) 1  Attention: Serial 7 subtraction starting at 100 (0/3) 3  Language: Repeat phrase (0/2) 2  Language : Fluency (0/1) 0  Abstraction (0/2) 2  Delayed Recall (0/5) 0  Orientation (0/6) 6  Total 23  Adjusted Score (based on education) 23    CRANIAL NERVES:  No visual field defects.  Pupils equal round and reactive to light.  Normal conjugate, extra-ocular eye movements in all directions of gaze.  No ptosis.  Face is symmetric. Palate elevates symmetrically.  Tongue is midline.  MOTOR:  Motor strength is 5/5 in all extremities.  No atrophy, fasciculations or abnormal movements.  No pronator drift.  Tone is normal.    COORDINATION/GAIT:  Normal finger-to- nose-finger.  Intact rapid alternating movements bilaterally.  Gait narrow based and stable.   Data: Out-side paper records, electronic medical record, and images have been reviewed where available and summarized as:  CTA head and neck wwo contrast 03/31/2020: 1. CT head negative for acute abnormality. Small acute infarct in the left thalamus seen on MRI last night is not visualized by CT. No intracranial hemorrhage 2. Atherosclerotic aortic arch and proximal great vessels.  Atherosclerotic disease in the carotid bifurcation bilaterally without significant stenosis. 3. Moderate to severe stenosis proximal right vertebral artery which is then patent to the basilar. Severe stenosis proximal left vertebral artery which is diffusely diseased and small but does contribute to the basilar. 4. Occlusion left P3 segment.  Moderate stenosis left P2 segment 5. Anterior circulation widely patent. 6. Multiple thyroid nodules. The largest nodule on the left is 20 mm. Thyroid ultrasound recommended. (Ref: J Am Coll Radiol. 2015 Feb;12(2): 143-50).  MRI  brain wo contrast 03/30/2020: 1. Punctate focus of acute ischemia within the dorsolateral left thalamus. No hemorrhage or mass effect. 2. Small, old left cerebellar infarct and findings of chronic ischemic microangiopathy.  MRI brain wwocontrast 05/17/2021: No evidence of acute intracranial abnormality. No cerebellopontine angle or internal auditory canal mass. Mild generalized parenchymal atrophy and cerebral white matter chronic small vessel ischemic changes, stable as compared to the brain MRI of 03/30/2020. Redemonstrated small chronic infarct within the left cerebellum.  Total time spent reviewing records, interview, history/exam, documentation, and coordination of care on day of encounter:  40 min   Thank you for allowing me to participate in patient's care.  If I can answer any additional questions, I would be pleased to do so.    Sincerely,    Rolinda Impson K. Posey Pronto, DO

## 2022-07-25 ENCOUNTER — Ambulatory Visit (INDEPENDENT_AMBULATORY_CARE_PROVIDER_SITE_OTHER): Payer: Medicare Other | Admitting: Neurology

## 2022-07-25 ENCOUNTER — Encounter: Payer: Self-pay | Admitting: Neurology

## 2022-07-25 VITALS — BP 179/78 | HR 83 | Ht 63.0 in | Wt 132.0 lb

## 2022-07-25 DIAGNOSIS — I255 Ischemic cardiomyopathy: Secondary | ICD-10-CM

## 2022-07-25 DIAGNOSIS — R413 Other amnesia: Secondary | ICD-10-CM | POA: Diagnosis not present

## 2022-07-25 DIAGNOSIS — F332 Major depressive disorder, recurrent severe without psychotic features: Secondary | ICD-10-CM

## 2022-07-25 NOTE — Progress Notes (Signed)
Remote ICD transmission.   

## 2022-07-25 NOTE — Patient Instructions (Signed)
Please follow-up with your psychiatrist for depression, as this is most likely the cause of your memory changes  MRI brain and neuropsychology testing will be ordered

## 2022-07-31 DIAGNOSIS — Z79899 Other long term (current) drug therapy: Secondary | ICD-10-CM | POA: Diagnosis not present

## 2022-07-31 DIAGNOSIS — F251 Schizoaffective disorder, depressive type: Secondary | ICD-10-CM | POA: Diagnosis not present

## 2022-08-01 DIAGNOSIS — F251 Schizoaffective disorder, depressive type: Secondary | ICD-10-CM | POA: Diagnosis not present

## 2022-08-01 DIAGNOSIS — Z79899 Other long term (current) drug therapy: Secondary | ICD-10-CM | POA: Diagnosis not present

## 2022-08-02 ENCOUNTER — Encounter (HOSPITAL_COMMUNITY)
Admission: RE | Disposition: A | Discharge status: Home / Self Care | Payer: Self-pay | Source: Home / Self Care | Attending: Internal Medicine

## 2022-08-02 ENCOUNTER — Other Ambulatory Visit: Payer: Self-pay

## 2022-08-02 ENCOUNTER — Ambulatory Visit (HOSPITAL_BASED_OUTPATIENT_CLINIC_OR_DEPARTMENT_OTHER)
Admission: RE | Admit: 2022-08-02 | Discharge: 2022-08-02 | Disposition: A | Discharge status: Home / Self Care | Payer: Medicare Other | Source: Ambulatory Visit | Attending: Internal Medicine | Admitting: Internal Medicine

## 2022-08-02 ENCOUNTER — Encounter (HOSPITAL_COMMUNITY): Payer: Self-pay | Admitting: Internal Medicine

## 2022-08-02 ENCOUNTER — Ambulatory Visit (HOSPITAL_BASED_OUTPATIENT_CLINIC_OR_DEPARTMENT_OTHER): Payer: Medicare Other | Admitting: Anesthesiology

## 2022-08-02 ENCOUNTER — Ambulatory Visit (HOSPITAL_COMMUNITY)
Admission: RE | Admit: 2022-08-02 | Discharge: 2022-08-02 | Disposition: A | Discharge status: Home / Self Care | Payer: Medicare Other | Attending: Internal Medicine | Admitting: Internal Medicine

## 2022-08-02 ENCOUNTER — Ambulatory Visit (HOSPITAL_COMMUNITY): Payer: Medicare Other | Admitting: Anesthesiology

## 2022-08-02 DIAGNOSIS — Z87891 Personal history of nicotine dependence: Secondary | ICD-10-CM | POA: Insufficient documentation

## 2022-08-02 DIAGNOSIS — I5022 Chronic systolic (congestive) heart failure: Secondary | ICD-10-CM | POA: Insufficient documentation

## 2022-08-02 DIAGNOSIS — Z955 Presence of coronary angioplasty implant and graft: Secondary | ICD-10-CM | POA: Insufficient documentation

## 2022-08-02 DIAGNOSIS — I255 Ischemic cardiomyopathy: Secondary | ICD-10-CM | POA: Diagnosis not present

## 2022-08-02 DIAGNOSIS — I340 Nonrheumatic mitral (valve) insufficiency: Secondary | ICD-10-CM

## 2022-08-02 DIAGNOSIS — I110 Hypertensive heart disease with heart failure: Secondary | ICD-10-CM | POA: Diagnosis not present

## 2022-08-02 DIAGNOSIS — I252 Old myocardial infarction: Secondary | ICD-10-CM | POA: Diagnosis not present

## 2022-08-02 DIAGNOSIS — I361 Nonrheumatic tricuspid (valve) insufficiency: Secondary | ICD-10-CM

## 2022-08-02 DIAGNOSIS — I083 Combined rheumatic disorders of mitral, aortic and tricuspid valves: Secondary | ICD-10-CM | POA: Diagnosis not present

## 2022-08-02 DIAGNOSIS — I447 Left bundle-branch block, unspecified: Secondary | ICD-10-CM | POA: Insufficient documentation

## 2022-08-02 DIAGNOSIS — I2510 Atherosclerotic heart disease of native coronary artery without angina pectoris: Secondary | ICD-10-CM | POA: Diagnosis not present

## 2022-08-02 DIAGNOSIS — I5020 Unspecified systolic (congestive) heart failure: Secondary | ICD-10-CM | POA: Diagnosis not present

## 2022-08-02 DIAGNOSIS — G4733 Obstructive sleep apnea (adult) (pediatric): Secondary | ICD-10-CM | POA: Diagnosis not present

## 2022-08-02 DIAGNOSIS — I509 Heart failure, unspecified: Secondary | ICD-10-CM | POA: Diagnosis not present

## 2022-08-02 DIAGNOSIS — I351 Nonrheumatic aortic (valve) insufficiency: Secondary | ICD-10-CM

## 2022-08-02 DIAGNOSIS — Z79899 Other long term (current) drug therapy: Secondary | ICD-10-CM | POA: Diagnosis not present

## 2022-08-02 DIAGNOSIS — E119 Type 2 diabetes mellitus without complications: Secondary | ICD-10-CM | POA: Diagnosis not present

## 2022-08-02 HISTORY — PX: RIGHT/LEFT HEART CATH AND CORONARY ANGIOGRAPHY: CATH118266

## 2022-08-02 HISTORY — PX: TEE WITHOUT CARDIOVERSION: SHX5443

## 2022-08-02 LAB — POCT I-STAT 7, (LYTES, BLD GAS, ICA,H+H)
Acid-base deficit: 5 mmol/L — ABNORMAL HIGH (ref 0.0–2.0)
Acid-base deficit: 1 mmol/L (ref 0.0–2.0)
Bicarbonate: 19 mmol/L — ABNORMAL LOW (ref 20.0–28.0)
Bicarbonate: 24.5 mmol/L (ref 20.0–28.0)
Calcium, Ion: 1.04 mmol/L — ABNORMAL LOW (ref 1.15–1.40)
Calcium, Ion: 1.32 mmol/L (ref 1.15–1.40)
HCT: 23 % — ABNORMAL LOW (ref 36.0–46.0)
HCT: 28 % — ABNORMAL LOW (ref 36.0–46.0)
Hemoglobin: 7.8 g/dL — ABNORMAL LOW (ref 12.0–15.0)
Hemoglobin: 9.5 g/dL — ABNORMAL LOW (ref 12.0–15.0)
O2 Saturation: 98 %
O2 Saturation: 53 %
Potassium: 3.1 mmol/L — ABNORMAL LOW (ref 3.5–5.1)
Potassium: 3.6 mmol/L (ref 3.5–5.1)
Sodium: 149 mmol/L — ABNORMAL HIGH (ref 135–145)
Sodium: 145 mmol/L (ref 135–145)
TCO2: 20 mmol/L — ABNORMAL LOW (ref 22–32)
TCO2: 26 mmol/L (ref 22–32)
pCO2 arterial: 29.4 mmHg — ABNORMAL LOW (ref 32–48)
pCO2 arterial: 43.5 mmHg (ref 32–48)
pH, Arterial: 7.418 (ref 7.35–7.45)
pH, Arterial: 7.359 (ref 7.35–7.45)
pO2, Arterial: 105 mmHg (ref 83–108)
pO2, Arterial: 29 mmHg — CL (ref 83–108)

## 2022-08-02 LAB — ECHO TEE
MV M vel: 6.1 m/s
MV Peak grad: 148.8 mmHg
Radius: 0.4 cm

## 2022-08-02 LAB — POCT I-STAT EG7
Acid-base deficit: 2 mmol/L (ref 0.0–2.0)
Bicarbonate: 23.2 mmol/L (ref 20.0–28.0)
Calcium, Ion: 1.28 mmol/L (ref 1.15–1.40)
HCT: 28 % — ABNORMAL LOW (ref 36.0–46.0)
Hemoglobin: 9.5 g/dL — ABNORMAL LOW (ref 12.0–15.0)
O2 Saturation: 53 %
Potassium: 3.5 mmol/L (ref 3.5–5.1)
Sodium: 146 mmol/L — ABNORMAL HIGH (ref 135–145)
TCO2: 24 mmol/L (ref 22–32)
pCO2, Ven: 41.2 mmHg — ABNORMAL LOW (ref 44–60)
pH, Ven: 7.359 (ref 7.25–7.43)
pO2, Ven: 29 mmHg — CL (ref 32–45)

## 2022-08-02 SURGERY — ECHOCARDIOGRAM, TRANSESOPHAGEAL
Anesthesia: Monitor Anesthesia Care

## 2022-08-02 SURGERY — RIGHT/LEFT HEART CATH AND CORONARY ANGIOGRAPHY
Anesthesia: LOCAL

## 2022-08-02 MED ORDER — ASPIRIN 81 MG PO CHEW
CHEWABLE_TABLET | ORAL | Status: AC
  Filled 2022-08-02: qty 1

## 2022-08-02 MED ORDER — ASPIRIN 81 MG PO CHEW
81.0000 mg | CHEWABLE_TABLET | Freq: Once | ORAL | Status: AC
  Administered 2022-08-02: 81 mg via ORAL

## 2022-08-02 MED ORDER — ONDANSETRON HCL 4 MG/2ML IJ SOLN: 4.0000 mg | Freq: Four times a day (QID) | INTRAMUSCULAR | Status: DC | PRN

## 2022-08-02 MED ORDER — HEPARIN SODIUM (PORCINE) 1000 UNIT/ML IJ SOLN
INTRAMUSCULAR | Status: DC | PRN
  Administered 2022-08-02: 3000 [IU] via INTRAVENOUS

## 2022-08-02 MED ORDER — SODIUM CHLORIDE 0.9 % IV SOLN: 250.0000 mL | INTRAVENOUS | Status: DC | PRN

## 2022-08-02 MED ORDER — VERAPAMIL HCL 2.5 MG/ML IV SOLN
INTRAVENOUS | Status: DC | PRN
  Administered 2022-08-02: 10 mL via INTRA_ARTERIAL

## 2022-08-02 MED ORDER — PROPOFOL 500 MG/50ML IV EMUL
INTRAVENOUS | Status: DC | PRN
  Administered 2022-08-02: 75 ug/kg/min via INTRAVENOUS

## 2022-08-02 MED ORDER — HYDRALAZINE HCL 20 MG/ML IJ SOLN: 10.0000 mg | INTRAMUSCULAR | Status: DC | PRN

## 2022-08-02 MED ORDER — IOHEXOL 350 MG/ML SOLN
INTRAVENOUS | Status: DC | PRN
  Administered 2022-08-02: 35 mL

## 2022-08-02 MED ORDER — ACETAMINOPHEN 325 MG PO TABS: 650.0000 mg | ORAL_TABLET | ORAL | Status: DC | PRN

## 2022-08-02 MED ORDER — MIDAZOLAM HCL 2 MG/2ML IJ SOLN
INTRAMUSCULAR | Status: AC
  Filled 2022-08-02: qty 2

## 2022-08-02 MED ORDER — LABETALOL HCL 5 MG/ML IV SOLN: 10.0000 mg | INTRAVENOUS | Status: DC | PRN

## 2022-08-02 MED ORDER — EPHEDRINE SULFATE (PRESSORS) 50 MG/ML IJ SOLN
INTRAMUSCULAR | Status: DC | PRN
  Administered 2022-08-02: 10 mg via INTRAVENOUS
  Administered 2022-08-02: 15 mg via INTRAVENOUS

## 2022-08-02 MED ORDER — PROPOFOL 10 MG/ML IV BOLUS
INTRAVENOUS | Status: DC | PRN
  Administered 2022-08-02: 30 mg via INTRAVENOUS

## 2022-08-02 MED ORDER — HEPARIN SODIUM (PORCINE) 1000 UNIT/ML IJ SOLN
INTRAMUSCULAR | Status: AC
  Filled 2022-08-02: qty 10

## 2022-08-02 MED ORDER — SODIUM CHLORIDE 0.9% FLUSH: 3.0000 mL | INTRAVENOUS | Status: DC | PRN

## 2022-08-02 MED ORDER — MIDAZOLAM HCL 2 MG/2ML IJ SOLN
INTRAMUSCULAR | Status: DC | PRN
  Administered 2022-08-02 (×2): 1 mg via INTRAVENOUS

## 2022-08-02 MED ORDER — HEPARIN (PORCINE) IN NACL 1000-0.9 UT/500ML-% IV SOLN
INTRAVENOUS | Status: AC
  Filled 2022-08-02: qty 1000

## 2022-08-02 MED ORDER — BUTAMBEN-TETRACAINE-BENZOCAINE 2-2-14 % EX AERO
INHALATION_SPRAY | CUTANEOUS | Status: DC | PRN
  Administered 2022-08-02: 2 via TOPICAL

## 2022-08-02 MED ORDER — VERAPAMIL HCL 2.5 MG/ML IV SOLN
INTRAVENOUS | Status: AC
  Filled 2022-08-02: qty 2

## 2022-08-02 MED ORDER — ASPIRIN 81 MG PO CHEW: 81.0000 mg | CHEWABLE_TABLET | ORAL | Status: DC

## 2022-08-02 MED ORDER — PHENYLEPHRINE HCL (PRESSORS) 10 MG/ML IV SOLN
INTRAVENOUS | Status: DC | PRN
  Administered 2022-08-02: 160 ug via INTRAVENOUS
  Administered 2022-08-02 (×2): 80 ug via INTRAVENOUS
  Administered 2022-08-02: 300 ug via INTRAVENOUS
  Administered 2022-08-02: 160 ug via INTRAVENOUS

## 2022-08-02 MED ORDER — LIDOCAINE HCL (PF) 1 % IJ SOLN
INTRAMUSCULAR | Status: DC | PRN
  Administered 2022-08-02: 1 mL
  Administered 2022-08-02 (×2): 2 mL

## 2022-08-02 MED ORDER — SODIUM CHLORIDE 0.9 % IV SOLN: INTRAVENOUS | Status: DC

## 2022-08-02 MED ORDER — LIDOCAINE 2% (20 MG/ML) 5 ML SYRINGE
INTRAMUSCULAR | Status: DC | PRN
  Administered 2022-08-02: 100 mg via INTRAVENOUS

## 2022-08-02 MED ORDER — SODIUM CHLORIDE 0.9% FLUSH: 3.0000 mL | Freq: Two times a day (BID) | INTRAVENOUS | Status: DC

## 2022-08-02 MED ORDER — HEPARIN (PORCINE) IN NACL 1000-0.9 UT/500ML-% IV SOLN
INTRAVENOUS | Status: DC | PRN
  Administered 2022-08-02 (×2): 500 mL

## 2022-08-02 MED ORDER — FUROSEMIDE 10 MG/ML IJ SOLN
40.0000 mg | Freq: Once | INTRAMUSCULAR | Status: AC
  Administered 2022-08-02: 40 mg via INTRAVENOUS
  Filled 2022-08-02: qty 4

## 2022-08-02 MED ORDER — POTASSIUM CHLORIDE CRYS ER 20 MEQ PO TBCR
40.0000 meq | EXTENDED_RELEASE_TABLET | Freq: Once | ORAL | Status: AC
  Administered 2022-08-02: 40 meq via ORAL
  Filled 2022-08-02: qty 2

## 2022-08-02 MED ORDER — LIDOCAINE HCL (PF) 1 % IJ SOLN
INTRAMUSCULAR | Status: AC
  Filled 2022-08-02: qty 30

## 2022-08-02 SURGICAL SUPPLY — 14 items
BAND ZEPHYR COMPRESS 30 LONG (HEMOSTASIS) IMPLANT
CATH 5FR JL3.5 JR4 ANG PIG MP (CATHETERS) IMPLANT
CATH BALLN WEDGE 5F 110CM (CATHETERS) IMPLANT
GLIDESHEATH SLEND SS 6F .021 (SHEATH) IMPLANT
GUIDEWIRE .025 260CM (WIRE) IMPLANT
GUIDEWIRE INQWIRE 1.5J.035X260 (WIRE) IMPLANT
INQWIRE 1.5J .035X260CM (WIRE) ×1
KIT MICROPUNCTURE NIT STIFF (SHEATH) IMPLANT
PACK CARDIAC CATHETERIZATION (CUSTOM PROCEDURE TRAY) ×1 IMPLANT
SHEATH GLIDE SLENDER 4/5FR (SHEATH) IMPLANT
SHEATH PROBE COVER 6X72 (BAG) IMPLANT
TRANSDUCER W/STOPCOCK (MISCELLANEOUS) ×1 IMPLANT
TUBING ART PRESS 72  MALE/FEM (TUBING) ×1
TUBING ART PRESS 72 MALE/FEM (TUBING) IMPLANT

## 2022-08-02 NOTE — Discharge Instructions (Signed)

## 2022-08-02 NOTE — Interval H&P Note (Signed)
History and Physical Interval Note:  08/02/2022 8:33 AM  Melanie Ray  has presented today for surgery, with the diagnosis of heart failure - mr.  The various methods of treatment have been discussed with the patient and family. After consideration of risks, benefits and other options for treatment, the patient has consented to  Procedure(s): RIGHT/LEFT HEART CATH AND CORONARY ANGIOGRAPHY (N/A) and possible coronary angioplasty as a surgical intervention.  The patient's history has been reviewed, patient examined, no change in status, stable for surgery.  I have reviewed the patient's chart and labs.  Questions were answered to the patient's satisfaction.     Byrl Latin

## 2022-08-02 NOTE — Progress Notes (Signed)
  Echocardiogram Echocardiogram Transesophageal has been performed.  Melanie Ray 08/02/2022, 10:17 AM

## 2022-08-02 NOTE — Anesthesia Preprocedure Evaluation (Addendum)
Anesthesia Evaluation  Patient identified by MRN, date of birth, ID band Patient awake    Reviewed: Allergy & Precautions, NPO status , Patient's Chart, lab work & pertinent test results  History of Anesthesia Complications Negative for: history of anesthetic complications  Airway Mallampati: III  TM Distance: >3 FB Neck ROM: Full    Dental  (+) Teeth Intact, Dental Advisory Given   Pulmonary sleep apnea , former smoker,    breath sounds clear to auscultation       Cardiovascular hypertension, Pt. on medications and Pt. on home beta blockers + CAD, + Past MI and +CHF   Rhythm:Regular  1. Left ventricular ejection fraction, by estimation, is 20 to 25%. The  left ventricle has severely decreased function. The left ventricle  demonstrates global hypokinesis. The left ventricular internal cavity size  was moderately dilated. Left  ventricular diastolic parameters are consistent with Grade II diastolic  dysfunction (pseudonormalization).  2. Right ventricular systolic function is normal. The right ventricular  size is normal. There is normal pulmonary artery systolic pressure.  3. Left atrial size was severely dilated.  4. The mitral valve is normal in structure. Severe mitral valve  regurgitation. No evidence of mitral stenosis. Moderate mitral annular  calcification.  5. The aortic valve is normal in structure. Aortic valve regurgitation is  mild. No aortic stenosis is present.  6. The inferior vena cava is dilated in size with >50% respiratory  variability, suggesting right atrial pressure of 8 mmHg.    ? The left ventricular ejection fraction is severely decreased (<30%). ? Nuclear stress EF: 20%. ? There was no ST segment deviation noted during stress. ? This is a high risk study.   High risk study due to severely decreased left ventricular systolic function. No ischemia is seen, although "balanced ischemia" cannot be  entirely excluded. There is profound systolic LBBB-related dyssynchrony. 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   ? LPDA lesion, 30% stenosed. ? Ost 1st Diag to 1st Diag lesion, 60% stenosed. ? Dist LAD lesion, 50% stenosed. ? Prox LAD lesion, 80% stenosed. Post intervention, there is a 0% residual stenosis. ? Mid LAD lesion, 80% stenosed. Post intervention, there is a 0% residual stenosis. ? There is mild left ventricular systolic dysfunction.    Neuro/Psych PSYCHIATRIC DISORDERS Anxiety CVA    GI/Hepatic negative GI ROS, Neg liver ROS,   Endo/Other  diabetesLab Results      Component                Value               Date                      HGBA1C                   5.5                 05/07/2022             Renal/GU negative Renal ROS     Musculoskeletal negative musculoskeletal ROS (+)   Abdominal   Peds  Hematology  (+) Blood dyscrasia, anemia , Lab Results      Component                Value               Date  WBC                      6.2                 07/17/2022                HGB                      11.3 (L)            07/17/2022                HCT                      35.1 (L)            07/17/2022                MCV                      83.6                07/17/2022                PLT                      245                 07/17/2022              Anesthesia Other Findings   Reproductive/Obstetrics                            Anesthesia Physical Anesthesia Plan  ASA: 3  Anesthesia Plan: MAC   Post-op Pain Management: Minimal or no pain anticipated   Induction: Intravenous  PONV Risk Score and Plan: 2 and Propofol infusion  Airway Management Planned: Nasal Cannula and Natural Airway  Additional Equipment: None  Intra-op Plan:   Post-operative Plan:   Informed Consent: I have reviewed the patients History and  Physical, chart, labs and discussed the procedure including the risks, benefits and alternatives for the proposed anesthesia with the patient or authorized representative who has indicated his/her understanding and acceptance.     Dental advisory given  Plan Discussed with: CRNA  Anesthesia Plan Comments:         Anesthesia Quick Evaluation

## 2022-08-02 NOTE — Transfer of Care (Signed)
Immediate Anesthesia Transfer of Care Note  Patient: Melanie Ray  Procedure(s) Performed: TRANSESOPHAGEAL ECHOCARDIOGRAM (TEE)  Patient Location: PACU  Anesthesia Type:MAC  Level of Consciousness: drowsy and patient cooperative  Airway & Oxygen Therapy: Patient Spontanous Breathing  Post-op Assessment: Report given to RN and Post -op Vital signs reviewed and stable  Post vital signs: Reviewed and stable  Last Vitals:  Vitals Value Taken Time  BP 155/71 08/02/22 1010  Temp    Pulse 82 08/02/22 1019  Resp 22 08/02/22 1019  SpO2 96 % 08/02/22 1019  Vitals shown include unvalidated device data.  Last Pain:  Vitals:   08/02/22 1036  TempSrc:   PainSc: 0-No pain         Complications: No notable events documented.

## 2022-08-02 NOTE — Interval H&P Note (Signed)
History and Physical Interval Note:  08/02/2022 8:32 AM  Melanie Ray  has presented today for surgery, with the diagnosis of MITRAL REGERGITATION.  The various methods of treatment have been discussed with the patient and family. After consideration of risks, benefits and other options for treatment, the patient has consented to  Procedure(s): TRANSESOPHAGEAL ECHOCARDIOGRAM (TEE) (N/A) as a surgical intervention.  The patient's history has been reviewed, patient examined, no change in status, stable for surgery.  I have reviewed the patient's chart and labs.  Questions were answered to the patient's satisfaction.     Joycelin Radloff

## 2022-08-02 NOTE — CV Procedure (Signed)
    TRANSESOPHAGEAL ECHOCARDIOGRAM   NAME:  Marionna Gonia   MRN: [id number] DOB:  1951/10/25   ADMIT DATE: 08/02/2022  INDICATIONS:  Mitral regurgitation  Operators: Grenda Lora, Sabharwal  PROCEDURE:   Informed consent was obtained prior to the procedure. The risks, benefits and alternatives for the procedure were discussed and the patient comprehended these risks.  Risks include, but are not limited to, cough, sore throat, vomiting, nausea, somnolence, esophageal and stomach trauma or perforation, bleeding, low blood pressure, aspiration, pneumonia, infection, trauma to the teeth and death.    After a procedural time-out, the patient was sedated by the anesthesia service.  The transesophageal probe was inserted in the esophagus and stomach without difficulty and multiple views were obtained.    COMPLICATIONS:    There were no immediate complications.  FINDINGS:  LEFT VENTRICLE: EF = 20%.Global HK  RIGHT VENTRICLE: Dilated. Moderately HK. + pacer  LEFT ATRIUM: Severely dilated  LEFT ATRIAL APPENDAGE: No thrombus.   RIGHT ATRIUM: Moderately dilated  AORTIC VALVE:  Trileaflet. Mild to moderate AI  MITRAL VALVE:  Degenerative/thickened. 2-3+ moderate central MR with restricted posterior leaflet. No MS. No significant flow reversal in pulmonary veins  TRICUSPID VALVE: Normal. Mild to moderate TR  PULMONIC VALVE: Grossly normal. Trivial PI   INTERATRIAL SEPTUM: No PFO or ASD.  PERICARDIUM: No effusion  DESCENDING AORTA: Moderate plaque   Benay Spice 9:37 AM

## 2022-08-03 ENCOUNTER — Encounter (HOSPITAL_COMMUNITY): Payer: Self-pay | Admitting: Internal Medicine

## 2022-08-03 DIAGNOSIS — F251 Schizoaffective disorder, depressive type: Secondary | ICD-10-CM | POA: Diagnosis not present

## 2022-08-03 DIAGNOSIS — Z79899 Other long term (current) drug therapy: Secondary | ICD-10-CM | POA: Diagnosis not present

## 2022-08-03 NOTE — Progress Notes (Signed)
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 The fossa looks approachable for transseptal puncture in the SAXB and Bicaval views. LA dimensions are large enough for device steering and straddle. The MR is caused by a dilated annulus & large left atrium. The posterior leaflet measures 1.29cm in the LVOT degree grasping view. MVA not measured, gradient is 5mhg at a HR of 85bpm. I'd like to verify both in the procedure prior to the start and recommend starting with an XTW placed at A2/P2 & assessing residual.   *TR noted (Pacing leads or other hardware noted in RA/RV)

## 2022-08-03 NOTE — Anesthesia Postprocedure Evaluation (Signed)
Anesthesia Post Note  Patient: Melanie Ray  Procedure(s) Performed: TRANSESOPHAGEAL ECHOCARDIOGRAM (TEE)     Patient location during evaluation: Endoscopy Anesthesia Type: MAC Level of consciousness: awake and alert Pain management: pain level controlled Vital Signs Assessment: post-procedure vital signs reviewed and stable Respiratory status: spontaneous breathing, nonlabored ventilation, respiratory function stable and patient connected to nasal cannula oxygen Cardiovascular status: stable and blood pressure returned to baseline Postop Assessment: no apparent nausea or vomiting Anesthetic complications: no   No notable events documented.  Last Vitals:  Vitals:   08/02/22 1243 08/02/22 1313  BP: (!) 158/70 (!) 156/69  Pulse: 85 86  Resp:    Temp:    SpO2: 98% 100%    Last Pain:  Vitals:   08/02/22 1200  TempSrc:   PainSc: 4                  Ailany Koren

## 2022-08-05 ENCOUNTER — Encounter (HOSPITAL_COMMUNITY): Payer: Self-pay | Admitting: Internal Medicine

## 2022-08-06 DIAGNOSIS — F251 Schizoaffective disorder, depressive type: Secondary | ICD-10-CM | POA: Diagnosis not present

## 2022-08-06 DIAGNOSIS — Z79899 Other long term (current) drug therapy: Secondary | ICD-10-CM | POA: Diagnosis not present

## 2022-08-08 ENCOUNTER — Encounter: Payer: Self-pay | Admitting: Cardiovascular Disease

## 2022-08-08 ENCOUNTER — Ambulatory Visit: Payer: Medicare Other | Attending: Cardiovascular Disease | Admitting: Cardiovascular Disease

## 2022-08-08 ENCOUNTER — Telehealth: Payer: Self-pay

## 2022-08-08 VITALS — BP 136/72 | HR 77 | Ht 63.0 in | Wt 131.0 lb

## 2022-08-08 DIAGNOSIS — I255 Ischemic cardiomyopathy: Secondary | ICD-10-CM

## 2022-08-08 DIAGNOSIS — I340 Nonrheumatic mitral (valve) insufficiency: Secondary | ICD-10-CM | POA: Insufficient documentation

## 2022-08-08 DIAGNOSIS — Z23 Encounter for immunization: Secondary | ICD-10-CM | POA: Diagnosis not present

## 2022-08-08 NOTE — Patient Instructions (Signed)
Medication Instructions:  Your physician recommends that you continue on your current medications as directed. Please refer to the Current Medication list given to you today.  *If you need a refill on your cardiac medications before your next appointment, please call your pharmacy*   Lab Work: NONE If you have labs (blood work) drawn today and your tests are completely normal, you will receive your results only by: [facility] (if you have MyChart) OR A paper copy in the mail If you have any lab test that is abnormal or we need to change your treatment, we will call you to review the results.   Testing/Procedures: Mitraclip procedure moving forward-you will be called to scheduled   Follow-Up: At [facility], you and your health needs are our priority.  As part of our continuing mission to provide you with exceptional heart care, we have created designated Provider Care Teams.  These Care Teams include your primary Cardiologist (physician) and Advanced Practice Providers (APPs -  Physician Assistants and Nurse Practitioners) who all work together to provide you with the care you need, when you need it.  Your next appointment:   Structural team will follow-up  The format for your next appointment:   In Person  Provider:   Sherren Mocha, MD    Important Information About Sugar

## 2022-08-08 NOTE — Progress Notes (Addendum)
Cardiology Office Note:    Date:  08/08/2022   ID:  Abrie, Egloff November 18, 1950, MRN [id number]  PCP:  Leone Haven, MD   [facility] Cardiologist:  Sherren Mocha, MD Cardiology APP:  Liliane Shi, PA-C  Electrophysiologist:  Vickie Epley, MD     Referring MD: Leone Haven, MD   Chief Complaint  Patient presents with   Mitral Regurgitation    History of Present Illness:    Melanie Ray is a 71 y.o. female referred by Dr Haroldine Laws for evaluation of mitral regurgitation.   The patient's medical history is well outlined below: Coronary artery disease  S/p Anterior STEMI 5/17 >> PCI: DES x 2 to LAD Myoview 7/21: EF 20, no ischemia Heart failure with reduced ejection fraction   Ischemic CM EF 30-35 at time of MI 5/17 >> DC on [facility] >> Pt DC'd at FU visit Echocardiogram 8/17: EF 25-30 Echocardiogram 3/18: EF 30-35 Echocardiogram 8/19: EF 30-35 EP eval (J Allred) x 2 >> pt declined CRT-D Intol of Entresto Echocardiogram 03/2020: EF < 20% Myoview 7/21 EF 20% no ischemia Obsessive Compulsive d/o Diabetes mellitus 2 Hypertension  Hyperlipidemia  LBBB -underwent CRT-D 01/02/2022 OSA Hepatic steatosis  Hx of cerebellar 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  Following CRT, her LVEF has not significantly improved.  She underwent an echocardiogram 07/17/2022 with LVEF 20 to 25%, normal RV function, and severe central mitral regurgitation.  She then underwent cardiac catheterization and TEE studies to assess her candidacy for transcatheter edge-to-edge repair of the mitral valve.  The patient's cardiac catheterization demonstrated continued patency of her LAD stents with nonobstructive disease noted.  She was noted to have elevated diastolic filling pressures with prominent V waves of 37 mmHg consistent with hemodynamically significant mitral regurgitation.  She had moderate next  pulmonary venous/arterial hypertension with a mean pulmonary artery pressure of 43 mmHg.  TEE demonstrated severe LV dysfunction with an LVEF of 20 to 25%, normal RV size with moderately reduced RV function, severe left atrial enlargement, and moderately severe mitral regurgitation with annular dilatation and restriction of the posterior leaflet.  The mitral regurgitation is graded at 3+.  From a symptomatic perspective, the patient is getting along okay.  She has lost a great deal of weight on the [employer].  She is feeling better with her weight loss.  She admits to shortness of breath with physical activity and describes a New York Heart Association functional class II limitation with shortness of breath with any moderate level activity.  Her husband has significant dementia and the patient cares for him.  She also has a dog with diabetes that requires insulin injections.  She denies chest pain, chest pressure, orthopnea, PND, heart palpitations, lightheadedness, syncope, or leg edema.  Past Medical History:  Diagnosis Date   Anemia    Anxiety    Back pain    CAD (coronary artery disease) 04/11/2016   S/p ant STEMI 5/17: LHC >> LAD proximal 80%, mid 80%, distal 50%, ostial D1 60%; LCx with LPDA lesion 30%; RCA Mild calcification with no significant stenosis in a medium caliber, nondominant RCA; LVEF is estimated at 45% with inferoapical and lateral wall akinesis >> PCI: PCI: 3.5 x 24 mm Promus DES to prox LAD, 2.5 x 12 mm Promus DES to mid LAD. // Myoview 7/21: EF 20, no ischemia; high risk    Chest pain    Chronic systolic  CHF (congestive heart failure) ([place]) 03/21/2016   Echo 01/30/17: Diff HK, mild focal basal septal hypertrophy, EF 30-35, mild AI, MAC, mild MR // Echo 06/08/16: Mild focal basal septal hypertrophy, EF 25-30%, diff HK, ant-septal AK, Gr 1 DD, mild AI, MAC, mild MR, PASP 37 mmHg // Echo 03/18/16: EF 30-35%, ant-septal AK, Gr 1 DD, mild MR, severe LAE.   Constipation    Depression     Diabetes mellitus type 2 in obese (HCC)    Dizziness    Glaucoma    Gout    Heart disease    Heartburn    History of acute anterior wall MI 03/17/2016   History of heart attack    Hyperlipemia    Hypertension    Ischemic cardiomyopathy 10/02/2016   Refused ICD   Joint pain    Left bundle branch block    Myocardial infarction ([place])    Nausea    Positive colorectal cancer screening using Cologuard test 03/01/2020   Sleep apnea    SOB (shortness of breath)    Suicidal ideation 08/27/2018   Swelling    feet or legs    Past Surgical History:  Procedure Laterality Date   APPENDECTOMY     BIV ICD INSERTION CRT-D N/A 01/02/2022   Procedure: BIV ICD INSERTION CRT-D;  Surgeon: Vickie Epley, MD;  Location: [facility];  Service: Cardiovascular;  Laterality: N/A;   CARDIAC CATHETERIZATION N/A 03/17/2016   Procedure: Left Heart Cath and Coronary Angiography;  Surgeon: Sherren Mocha, MD;  Location: [facility];  Service: Cardiovascular;  Laterality: N/A;   CARDIAC CATHETERIZATION N/A 03/17/2016   Procedure: Coronary Stent Intervention;  Surgeon: Sherren Mocha, MD;  Location: [facility];  Service: Cardiovascular;  Laterality: N/A;   COLONOSCOPY WITH PROPOFOL N/A 08/13/2017   Procedure: COLONOSCOPY WITH PROPOFOL;  Surgeon: Jonathon Bellows, MD;  Location[facility] ENDOSCOPY;  Service: Gastroenterology;  Laterality: N/A;   COLONOSCOPY WITH PROPOFOL N/A 03/29/2020   Procedure: COLONOSCOPY WITH PROPOFOL;  Surgeon: Lucilla Lame, MD;  Location: [facility] ENDOSCOPY;  Service: Endoscopy;  Laterality: N/A;   RIGHT/LEFT HEART CATH AND CORONARY ANGIOGRAPHY N/A 08/02/2022   Procedure: RIGHT/LEFT HEART CATH AND CORONARY ANGIOGRAPHY;  Surgeon: Jolaine Artist, MD;  Location: [facility];  Service: Cardiovascular;  Laterality: N/A;   SMALL BOWEL REPAIR     TEE WITHOUT CARDIOVERSION N/A 08/02/2022   Procedure: TRANSESOPHAGEAL ECHOCARDIOGRAM (TEE);  Surgeon: Jolaine Artist, MD;   Location: [facility] ENDOSCOPY;  Service: Cardiovascular;  Laterality: N/A;   TONSILLECTOMY     UTERINE FIBROID SURGERY      Current Medications: Current Meds  Medication Sig   atorvastatin (LIPITOR) 80 MG tablet Take 1 tablet by mouth once daily   benzonatate (TESSALON) 200 MG capsule Take 200 mg by mouth 3 (three) times daily as needed for cough.   carvedilol (COREG) 12.5 MG tablet TAKE 1 TABLET BY MOUTH TWICE DAILY WITH MEALS   clopidogrel (PLAVIX) 75 MG tablet Take 1 tablet by mouth once daily   dorzolamide-timolol (COSOPT) 22.3-6.8 MG/ML ophthalmic solution Place 1 drop into both eyes 2 (two) times daily.    furosemide (LASIX) 20 MG tablet Take 20 mg by mouth every other day.   ibuprofen (ADVIL) 800 MG tablet Take 800 mg by mouth every 8 (eight) hours as needed (pain).   isosorbide mononitrate (IMDUR) 30 MG 24 hr tablet Take 1 tablet by mouth once daily   latanoprost (XALATAN) 0.005 % ophthalmic solution Place 1 drop  into both eyes at bedtime.   LORazepam (ATIVAN) 1 MG tablet Take 1 mg by mouth at bedtime as needed for sleep or anxiety.   lumateperone tosylate (CAPLYTA) 42 MG capsule Take 42 mg by mouth daily.   nitroGLYCERIN (NITROSTAT) 0.4 MG SL tablet DISSOLVE ONE TABLET UNDER THE TONGUE EVERY FIVE MINUTES AS NEEDED FOR CHEST PAIN. DO NOT EXCEED A TOTAL OF THREE DOSES IN 15 MINUTES   traZODone (DESYREL) 150 MG tablet Take 150 mg by mouth at bedtime.   valsartan (DIOVAN) 80 MG tablet Take 80 mg by mouth daily.   venlafaxine XR (EFFEXOR-XR) 150 MG 24 hr capsule TAKE 2 CAPSULES BY MOUTH ONCE DAILY (NEEDS to follow-up with psychiatry for further refills)     Allergies:   Tetracycline, Meperidine, and Entresto [sacubitril-valsartan]   Social History   Socioeconomic History   Marital status: Married    Spouse name: Dorene Bruni   Number of children: 0   Years of education: BS in education   Highest education level: Not on file  Occupational History   Occupation:  Retired Surveyor, minerals  Tobacco Use   Smoking status: Former    Types: Cigarettes    Quit date: 04/13/1997    Years since quitting: 25.3   Smokeless tobacco: Never  Vaping Use   Vaping Use: Never used  Substance and Sexual Activity   Alcohol use: Not Currently   Drug use: No   Sexual activity: Not Currently  Other Topics Concern   Not on file  Social History Narrative   Lives in [place] with spouse.  No children.   Retired first [employer] for over 30 years ([place] for 10 years and then in [place] for over 20 years).   Left-handed   Lives in a two story home       Social Determinants of Health   Financial Resource Strain: Low Risk  (03/30/2022)   Overall Financial Resource Strain (CARDIA)    Difficulty of Paying Living Expenses: Not hard at all  Food Insecurity: No Food Insecurity (03/30/2022)   Hunger Vital Sign    Worried About Running Out of Food in the Last Year: Never true    Ran Out of Food in the Last Year: Never true  Transportation Needs: No Transportation Needs (03/30/2022)   PRAPARE - Hydrologist (Medical): No    Lack of Transportation (Non-Medical): No  Physical Activity: Not on file  Stress: No Stress Concern Present (03/30/2022)   [place]    Feeling of Stress : Not at all  Social Connections: Unknown (03/30/2022)   Social Connection and Isolation Panel [NHANES]    Frequency of Communication with Friends and Family: Not on file    Frequency of Social Gatherings with Friends and Family: Not on file    Attends Religious Services: Not on file    Active Member of Clubs or Organizations: Not on file    Attends Archivist Meetings: Not on file    Marital Status: Married     Family History: The patient's family history includes Anxiety disorder in her maternal aunt and mother; Dementia in her mother; Depression in her mother; Diabetes in her mother; Drug abuse in  her cousin; High Cholesterol in her father and mother; Hyperlipidemia in her mother; Hypertension in her father and mother; Mood Disorder in her sister; Paranoid behavior in her mother; Stroke in her sister; Tuberculosis in her paternal grandfather.  ROS:   Please see the history of present illness.    All other systems reviewed and are negative.  EKGs/Labs/Other Studies Reviewed:    The following studies were reviewed today: Echocardiogram 07/17/2022: 1. Left ventricular ejection fraction, by estimation, is 20 to 25%. The  left ventricle has severely decreased function. The left ventricle  demonstrates global hypokinesis. The left ventricular internal cavity size  was moderately dilated. Left  ventricular diastolic parameters are consistent with Grade II diastolic  dysfunction (pseudonormalization).   2. Right ventricular systolic function is normal. The right ventricular  size is normal. There is normal pulmonary artery systolic pressure.   3. Left atrial size was severely dilated.   4. The mitral valve is normal in structure. Severe mitral valve  regurgitation. No evidence of mitral stenosis. Moderate mitral annular  calcification.   5. The aortic valve is normal in structure. Aortic valve regurgitation is  mild. No aortic stenosis is present.   6. The inferior vena cava is dilated in size with >50% respiratory  variability, suggesting right atrial pressure of 8 mmHg.   TEE 08/02/2022: 1. Left ventricular ejection fraction, by estimation, is 20 to 25%. The  left ventricle has severely decreased function. The left ventricular  internal cavity size was moderately dilated.   2. Right ventricular systolic function is moderately reduced. The right  ventricular size is normal.   3. Left atrial size was severely dilated. No left atrial/left atrial  appendage thrombus was detected.   4. Right atrial size was mildly dilated.   5. MV is decgenative. Mildly thickened. Posterior leaflet  restricted. 3+  central MR. The mitral valve is degenerative. Moderate mitral valve  regurgitation.   6. Tricuspid valve regurgitation is mild to moderate.   7. The aortic valve is tricuspid. Aortic valve regurgitation is mild.   8. There is mild (Grade II) plaque.   EKG:  EKG is not ordered today.   Recent Labs: 09/05/2021: TSH 0.64 12/19/2021: Pro B Natriuretic peptide (BNP) 2,342.0 07/06/2022: ALT 17 07/17/2022: B Natriuretic Peptide 2,184.7; BUN 19; Creatinine, Ser 1.13; Platelets 245 08/02/2022: Hemoglobin 9.5; Hemoglobin 9.5; Potassium 3.5; Potassium 3.6; Sodium 146; Sodium 145  Recent Lipid Panel    Component Value Date/Time   CHOL 139 12/19/2021 1107   CHOL 112 04/18/2020 0815   TRIG 71.0 12/19/2021 1107   HDL 41.60 12/19/2021 1107   HDL 39 (L) 04/18/2020 0815   CHOLHDL 3 12/19/2021 1107   VLDL 14.2 12/19/2021 1107   LDLCALC 83 12/19/2021 1107   LDLCALC 59 04/18/2020 0815   LDLDIRECT 78.0 02/07/2018 0928     Risk Assessment/Calculations:     STS Risk calculation:  Isolated MVR  Procedure Type: Isolated MVR Perioperative Outcome Estimate % Operative Mortality 9.07% Morbidity & Mortality 37% Stroke 4.41% Renal Failure 5.07% Reoperation 5.61% Prolonged Ventilation 23.8% Deep Sternal Wound Infection 0.063% [facility] Hospital Stay (>14 days) 19.2% Short Hospital Stay (<6 days)* 9.39%      Isolated Mitral Valve Repair:  Procedure Type: Isolated MVr Perioperative Outcome Estimate % Operative Mortality 5.35% Morbidity & Mortality 28.5% Stroke 4.48% Renal Failure 2.56% Reoperation 3.97% Prolonged Ventilation 20.7% Deep Sternal Wound Infection 0.054% [facility] Hospital Stay (>14 days) 18.8% Short Hospital Stay (<6 days)* 16.3%  Physical Exam:    VS:  BP 136/72   Pulse 77   Ht _0  (1.6 m)   Wt 131 lb (59.4 kg)   SpO2 98%   BMI 23.21 kg/m     Wt Readings from Last 3  Encounters:  08/08/22 131 lb (59.4 kg)  08/02/22 132 lb 0.9 oz (59.9 kg)  07/25/22 132 lb  (59.9 kg)     GEN:  Well nourished, well developed in no acute distress HEENT: Normal NECK: No JVD; No carotid bruits LYMPHATICS: No lymphadenopathy CARDIAC: RRR, no murmurs, rubs, gallops RESPIRATORY:  Clear to auscultation without rales, wheezing or rhonchi  ABDOMEN: Soft, non-tender, non-distended MUSCULOSKELETAL:  No edema; No deformity  SKIN: Warm and dry NEUROLOGIC:  Alert and oriented x 3 PSYCHIATRIC:  Normal affect   ASSESSMENT:    1. Nonrheumatic mitral valve regurgitation    PLAN:    In order of problems listed above:  The patient has ischemic cardiomyopathy with severe LV dysfunction and nonresponsiveness to CRT-D with persistent LVEF of 20 to 25%.  She has New York Heart Association functional class II symptoms of chronic systolic heart failure.  The patient has 3+ mitral regurgitation based on TEE assessment.  On her surface echocardiogram her MR was graded as severe.  I have reviewed both the 2D echo images and the TEE images which demonstrate Carpentier class I and IIIb mitral valve dysfunction with annular dilatation as well as posterior leaflet restriction.  The patient has moderate mitral annular calcification seen on echo and fluoroscopy at time of her catheterization.  Her cardiac catheterization images are reviewed and she has nonobstructive CAD with a left dominant coronary circulation.  We discussed the association of systolic heart failure and mitral regurgitation today.  The patient has had 2 heart failure hospitalizations since December 2022.  Management strategies including continued medical therapy, surgical valve repair with ring annuloplasty, and transcatheter edge-to-edge repair of the mitral valve are reviewed with the patient.  Based on the coapt data, and this patient on maximally tolerated medical therapy followed by an advanced heart failure specialist, it is reasonable to consider transcatheter edge-to-edge repair of the mitral valve to improve her  functional capacity and reduce her risk of heart failure hospitalization.  I reviewed risks, indications, and alternatives to transcatheter edge-to-edge repair of the mitral valve with the patient today.  She understands the risks of the procedure include but are not limited to vascular injury, infection, stroke, myocardial infarction, cardiac injury, cardiac tamponade, need for pericardiocentesis, damage to the mitral valve, single leaflet device attachment, device embolization, arrhythmia, emergency cardiac surgery, and death.  The risk of serious complication is approximately 1 to 2%.  After a shared decision making conversation, the patient would like to proceed with transcatheter edge-to-edge repair of the mitral valve.  She has some social circumstances that she needs to account for including care of her husband who has dementia.  We will work on the logistics of performing her procedure.  She understands that she will be hospitalized overnight following the procedure.  I would plan on continuing her on clopidogrel post procedure.           Medication Adjustments/Labs and Tests Ordered: Current medicines are reviewed at length with the patient today.  Concerns regarding medicines are outlined above.  No orders of the defined types were placed in this encounter.  No orders of the defined types were placed in this encounter.   Patient Instructions  Medication Instructions:  Your physician recommends that you continue on your current medications as directed. Please refer to the Current Medication list given to you today.  *If you need a refill on your cardiac medications before your next appointment, please call your pharmacy*   Lab Work: NONE If you  have labs (blood work) drawn today and your tests are completely normal, you will receive your results only by: [place] (if you have MyChart) OR A paper copy in the mail If you have any lab test that is abnormal or we need to change  your treatment, we will call you to review the results.   Testing/Procedures: Mitraclip procedure moving forward-you will be called to scheduled   Follow-Up: At [facility], you and your health needs are our priority.  As part of our continuing mission to provide you with exceptional heart care, we have created designated Provider Care Teams.  These Care Teams include your primary Cardiologist (physician) and Advanced Practice Providers (APPs -  Physician Assistants and Nurse Practitioners) who all work together to provide you with the care you need, when you need it.  Your next appointment:   Structural team will follow-up  The format for your next appointment:   In Person  Provider:   Sherren Mocha, MD    Important Information About Sugar         Signed, Sherren Mocha, MD  08/08/2022 11:08 AM    [place]

## 2022-08-08 NOTE — Telephone Encounter (Signed)
Spoke with the patient in detail about MitraClip procedure. Due to personal conflicts, she wishes to proceed with MitraClip on 10/04/2022. In the meantime, she will work to find a caregiver for her insulin dependent dog who she is very concerned about.   Per Dr. Burt Knack, will attempt to arrange for her husband to stay with her in the hospital (she is his caregiver and is concerned for his safety and is also worried she will be too anxious to do the procedure without him there).   Will arrange PAT after visit with Kathyrn Drown on 11/27. The patient was grateful for call and agrees with plan.

## 2022-08-10 ENCOUNTER — Other Ambulatory Visit: Payer: Self-pay

## 2022-08-10 DIAGNOSIS — I340 Nonrheumatic mitral (valve) insufficiency: Secondary | ICD-10-CM

## 2022-08-12 NOTE — Progress Notes (Signed)
[place]  Telephone:([phone]) [phone] Fax:(336) [phone]  ID: Melanie Ray OB: 1951/06/25  MR#: [id number]  HAL#:[id number]  Patient Care Team: Leone Haven, MD as PCP - General (Family Medicine) Sherren Mocha, MD as PCP - Cardiology (Cardiology) Vickie Epley, MD as PCP - Electrophysiology (Cardiology) Sharmon Revere as Physician Assistant (Cardiology) Bensimhon, Shaune Pascal, MD as Consulting Physician (Cardiology) Alda Berthold, DO as Consulting Physician (Neurology) Lloyd Huger, MD as Consulting Physician (Hematology and Oncology)  CHIEF COMPLAINT: Anemia, unspecified.  INTERVAL HISTORY: Patient returns to clinic today for repeat laboratory work in 71-monthevaluation.  She continues to feel well and remains asymptomatic.  She does not complain of any weakness or fatigue today.  She denies any recent fevers or illnesses.  She has no neurologic complaints.  She has a good appetite and denies weight loss.  She has no chest pain, shortness of breath, cough, or hemoptysis.  She denies any nausea, vomiting, constipation, or diarrhea.  She has no melena or hematochezia.  She has no urinary complaints.  Patient feels at her baseline offers no specific complaints today.  REVIEW OF SYSTEMS:   Review of Systems  Constitutional: Negative.  Negative for fever, malaise/fatigue and weight loss.  Respiratory: Negative.  Negative for cough, hemoptysis and shortness of breath.   Cardiovascular: Negative.  Negative for chest pain and leg swelling.  Gastrointestinal:  Negative for abdominal pain, blood in stool and melena.  Genitourinary: Negative.  Negative for hematuria.  Musculoskeletal: Negative.  Negative for back pain.  Skin: Negative.  Negative for rash.  Neurological: Negative.  Negative for dizziness, focal weakness, weakness and headaches.  Psychiatric/Behavioral: Negative.  The patient is not nervous/anxious.     As per HPI. Otherwise, a  complete review of systems is negative.  PAST MEDICAL HISTORY: Past Medical History:  Diagnosis Date   Anemia    Anxiety    Back pain    CAD (coronary artery disease) 04/11/2016   S/p ant STEMI 5/17: LHC >> LAD proximal 80%, mid 80%, distal 50%, ostial D1 60%; LCx with LPDA lesion 30%; RCA Mild calcification with no significant stenosis in a medium caliber, nondominant RCA; LVEF is estimated at 45% with inferoapical and lateral wall akinesis >> PCI: PCI: 3.5 x 24 mm Promus DES to prox LAD, 2.5 x 12 mm Promus DES to mid LAD. // Myoview 7/21: EF 20, no ischemia; high risk    Chest pain    Chronic systolic CHF (congestive heart failure) ([facility] 03/21/2016   Echo 01/30/17: Diff HK, mild focal basal septal hypertrophy, EF 30-35, mild AI, MAC, mild MR // Echo 06/08/16: Mild focal basal septal hypertrophy, EF 25-30%, diff HK, ant-septal AK, Gr 1 DD, mild AI, MAC, mild MR, PASP 37 mmHg // Echo 03/18/16: EF 30-35%, ant-septal AK, Gr 1 DD, mild MR, severe LAE.   Constipation    Depression    Diabetes mellitus type 2 in obese (HCC)    Dizziness    Glaucoma    Gout    Heart disease    Heartburn    History of acute anterior wall MI 03/17/2016   History of heart attack    Hyperlipemia    Hypertension    Ischemic cardiomyopathy 10/02/2016   Refused ICD   Joint pain    Left bundle branch block    Myocardial infarction (HCC)    Nausea    Positive colorectal cancer screening using Cologuard test 03/01/2020   Sleep apnea  SOB (shortness of breath)    Suicidal ideation 08/27/2018   Swelling    feet or legs    PAST SURGICAL HISTORY: Past Surgical History:  Procedure Laterality Date   APPENDECTOMY     BIV ICD INSERTION CRT-D N/A 01/02/2022   Procedure: BIV ICD INSERTION CRT-D;  Surgeon: Vickie Epley, MD;  Location: [place] [facility];  Service: Cardiovascular;  Laterality: N/A;   CARDIAC CATHETERIZATION N/A 03/17/2016   Procedure: Left Heart Cath and Coronary Angiography;  Surgeon: Sherren Mocha, MD;  Location: [place] [facility];  Service: Cardiovascular;  Laterality: N/A;   CARDIAC CATHETERIZATION N/A 03/17/2016   Procedure: Coronary Stent Intervention;  Surgeon: Sherren Mocha, MD;  Location: [place] [facility];  Service: Cardiovascular;  Laterality: N/A;   COLONOSCOPY WITH PROPOFOL N/A 08/13/2017   Procedure: COLONOSCOPY WITH PROPOFOL;  Surgeon: Jonathon Bellows, MD;  Location: [facility];  Service: Gastroenterology;  Laterality: N/A;   COLONOSCOPY WITH PROPOFOL N/A 03/29/2020   Procedure: COLONOSCOPY WITH PROPOFOL;  Surgeon: Lucilla Lame, MD;  Location: [facility] END[facility]COPY;  Service: Endoscopy;  Laterality: N/A;   RIGHT/LEFT HEART CATH AND CORONARY ANGIOGRAPHY N/A 08/02/2022   Procedure: RIGHT/LEFT HEART CATH AND CORONARY ANGIOGRAPHY;  Surgeon: Jolaine Artist, MD;  Location: [place] [facility];  Service: Cardiovascular;  Laterality: N/A;   SMALL BOWEL REPAIR     TEE WITHOUT CARDIOVERSION N/A 08/02/2022   Procedure: TRANSESOPHAGEAL ECHOCARDIOGRAM (TEE);  Surgeon: Jolaine Artist, MD;  Location: [facility] ENDOSCOPY;  Service: Cardiovascular;  Laterality: N/A;   TONSILLECTOMY     UTERINE FIBROID SURGERY      FAMILY HISTORY: Family History  Problem Relation Age of Onset   Anxiety disorder Mother    Paranoid behavior Mother    Hypertension Mother    Dementia Mother    High Cholesterol Mother    Diabetes Mother    Hyperlipidemia Mother    Depression Mother    Hypertension Father    High Cholesterol Father    Mood Disorder Sister    Stroke Sister    Anxiety disorder Maternal Aunt    Tuberculosis Paternal Grandfather    Drug abuse Cousin     ADVANCED DIRECTIVES (Y/N):  N  HEALTH MAINTENANCE: Social History   Tobacco Use   Smoking status: Former    Types: Cigarettes    Quit date: 04/13/1997    Years since quitting: 25.3   Smokeless tobacco: Never  Vaping Use   Vaping Use: Never used  Substance Use Topics   Alcohol use: Not Currently   Drug use: No      Colonoscopy:  PAP:  Bone density:  Lipid panel:  Allergies  Allergen Reactions   Tetracycline Swelling   Meperidine Nausea And Vomiting    (Demerol)Nausea   Entresto [Sacubitril-Valsartan] Other (See Comments)    Shortness of Breath    Current Outpatient Medications  Medication Sig Dispense Refill   atorvastatin (LIPITOR) 80 MG tablet Take 1 tablet by mouth once daily 90 tablet 3   carvedilol (COREG) 12.5 MG tablet TAKE 1 TABLET BY MOUTH TWICE DAILY WITH MEALS 60 tablet 9   clopidogrel (PLAVIX) 75 MG tablet Take 1 tablet by mouth once daily 90 tablet 0   dorzolamide-timolol (COSOPT) 22.3-6.8 MG/ML ophthalmic solution Place 1 drop into both eyes 2 (two) times daily.      furosemide (LASIX) 20 MG tablet Take 20 mg by mouth every other day.     isosorbide mononitrate (IMDUR) 30 MG 24 hr tablet Take 1 tablet  by mouth once daily 90 tablet 0   latanoprost (XALATAN) 0.005 % ophthalmic solution Place 1 drop into both eyes at bedtime.     LORazepam (ATIVAN) 1 MG tablet Take 1 mg by mouth at bedtime as needed for sleep or anxiety.     lumateperone tosylate (CAPLYTA) 42 MG capsule Take 42 mg by mouth daily.     nitroGLYCERIN (NITROSTAT) 0.4 MG SL tablet DISSOLVE ONE TABLET UNDER THE TONGUE EVERY FIVE MINUTES AS NEEDED FOR CHEST PAIN. DO NOT EXCEED A TOTAL OF THREE DOSES IN 15 MINUTES 25 tablet 4   traZODone (DESYREL) 150 MG tablet Take 150 mg by mouth at bedtime.     valsartan (DIOVAN) 80 MG tablet Take 80 mg by mouth daily.     venlafaxine XR (EFFEXOR-XR) 150 MG 24 hr capsule TAKE 2 CAPSULES BY MOUTH ONCE DAILY (NEEDS to follow-up with psychiatry for further refills) 60 capsule 0   benzonatate (TESSALON) 200 MG capsule Take 200 mg by mouth 3 (three) times daily as needed for cough. (Patient not taking: Reported on 08/15/2022)     ibuprofen (ADVIL) 800 MG tablet Take 800 mg by mouth every 8 (eight) hours as needed (pain). (Patient not taking: Reported on 08/15/2022)     No current  facility-administered medications for this visit.    OBJECTIVE: Vitals:   08/15/22 1101  BP: (!) 152/77  Pulse: 81  Resp: 20  Temp: 98.1 F (36.7 C)  SpO2: 100%     Body mass index is 23.03 kg/m.    ECOG FS:0 - Asymptomatic  General: Well-developed, well-nourished, no acute distress. Eyes: Pink conjunctiva, anicteric sclera. HEENT: Normocephalic, moist mucous membranes. Lungs: No audible wheezing or coughing. Heart: Regular rate and rhythm. Abdomen: Soft, nontender, no obvious distention. Musculoskeletal: No edema, cyanosis, or clubbing. Neuro: Alert, answering all questions appropriately. Cranial nerves grossly intact. Skin: No rashes or petechiae noted. Psych: Normal affect.   LAB RESULTS:  Lab Results  Component Value Date   NA 146 (H) 08/02/2022   NA 145 08/02/2022   K 3.5 08/02/2022   K 3.6 08/02/2022   CL 113 (H) 07/17/2022   CO2 22 07/17/2022   GLUCOSE 71 07/17/2022   BUN 19 07/17/2022   CREATININE 1.13 (H) 07/17/2022   CALCIUM 9.6 07/17/2022   PROT 6.6 07/06/2022   ALBUMIN 3.8 07/06/2022   AST 17 07/06/2022   ALT 17 07/06/2022   ALKPHOS 108 07/06/2022   BILITOT 0.8 07/06/2022   GFRNONAA 52 (L) 07/17/2022   GFRAA 58 (L) 11/30/2020    Lab Results  Component Value Date   WBC 7.8 08/15/2022   NEUTROABS 5.5 08/15/2022   HGB 11.4 (L) 08/15/2022   HCT 35.1 (L) 08/15/2022   MCV 84.0 08/15/2022   PLT 271 08/15/2022   Lab Results  Component Value Date   IRON 70 11/09/2021   TIBC 349 11/09/2021   IRONPCTSAT 20 11/09/2021   Lab Results  Component Value Date   FERRITIN 84 11/09/2021     STUDIES: ECHO TEE  Result Date: 08/02/2022    TRANSESOPHOGEAL ECHO REPORT   Patient Name:   JAMILA ANN [facility] Date of Exam: 08/02/2022 Medical Rec #:  [id number]             Height:       63.0 in Accession #:    [id number]            Weight:       132.0 lb Date of Birth:  03/24/1951  BSA:          1.621 m Patient Age:    6 years              BP:            150/111 mmHg Patient Gender: F                     HR:           88 bpm. Exam Location:  Outpatient Procedure: 3D Echo, Transesophageal Echo, Cardiac Doppler and Color Doppler Indications:     mitral regurgitation  History:         Patient has prior history of Echocardiogram examinations, most                  recent 07/17/2022. Cardiomyopathy and CHF, Defibrillator,                  Arrythmias:LBBB; Risk Factors:Diabetes, Hypertension and                  Dyslipidemia.  Sonographer:     Johny Chess RDCS Referring Phys:  [id number] DANIEL R BENSIMHON Diagnosing Phys: Glori Bickers MD PROCEDURE: After discussion of the risks and benefits of a TEE, an informed consent was obtained from the patient. The transesophogeal probe was passed without difficulty through the esophogus of the patient. Imaged were obtained with the patient in a left lateral decubitus position. Sedation performed by different physician. The patient was monitored while under deep sedation. Anesthestetic sedation was provided intravenously by Anesthesiology: 16m of Propofol, 1024mof Lidocaine. The patient developed no complications during the procedure. IMPRESSIONS  1. Left ventricular ejection fraction, by estimation, is 20 to 25%. The left ventricle has severely decreased function. The left ventricular internal cavity size was moderately dilated.  2. Right ventricular systolic function is moderately reduced. The right ventricular size is normal.  3. Left atrial size was severely dilated. No left atrial/left atrial appendage thrombus was detected.  4. Right atrial size was mildly dilated.  5. MV is decgenative. Mildly thickened. Posterior leaflet restricted. 3+ central MR. The mitral valve is degenerative. Moderate mitral valve regurgitation.  6. Tricuspid valve regurgitation is mild to moderate.  7. The aortic valve is tricuspid. Aortic valve regurgitation is mild.  8. There is mild (Grade II) plaque. FINDINGS  Left Ventricle: Left  ventricular ejection fraction, by estimation, is 20 to 25%. The left ventricle has severely decreased function. The left ventricular internal cavity size was moderately dilated. Right Ventricle: The right ventricular size is normal. No increase in right ventricular wall thickness. Right ventricular systolic function is moderately reduced. Left Atrium: Left atrial size was severely dilated. No left atrial/left atrial appendage thrombus was detected. Right Atrium: Right atrial size was mildly dilated. Pericardium: There is no evidence of pericardial effusion. Mitral Valve: MV is decgenative. Mildly thickened. Posterior leaflet restricted. 3+ central MR. The mitral valve is degenerative in appearance. There is mild thickening of the mitral valve leaflet(s). Moderate mitral valve regurgitation, with centrally-directed jet. MV peak gradient, 9.9 mmHg. The mean mitral valve gradient is 3.0 mmHg. Tricuspid Valve: The tricuspid valve is normal in structure. Tricuspid valve regurgitation is mild to moderate. Aortic Valve: The aortic valve is tricuspid. Aortic valve regurgitation is mild. Pulmonic Valve: The pulmonic valve was grossly normal. Pulmonic valve regurgitation is not visualized. Aorta: The aortic root and ascending aorta are structurally normal, with no evidence of dilitation. There is mild (Grade II) plaque. IAS/Shunts:  No atrial level shunt detected by color flow Doppler. Additional Comments: A device lead is visualized.  MITRAL VALVE MV Peak grad: 9.9 mmHg MV Mean grad: 3.0 mmHg MV Vmax:      1.57 m/s MV Vmean:     85.2 cm/s MR Peak grad:    148.8 mmHg MR Mean grad:    84.0 mmHg MR Vmax:         610.00 cm/s MR Vmean:        425.0 cm/s MR PISA:         1.01 cm MR PISA Eff ROA: 6 mm MR PISA Radius:  0.40 cm Glori Bickers MD Electronically signed by Glori Bickers MD Signature Date/Time: 08/02/2022/4:17:44 PM    Final    CARDIAC CATHETERIZATION  Result Date: 08/02/2022   Ost 1st Diag to 1st Diag lesion is  60% stenosed.   Mid Cx lesion is 30% stenosed.   Mid LAD-2 lesion is 40% stenosed.   Dist LAD lesion is 30% stenosed.   Non-stenotic Mid LAD-1 lesion was previously treated.   Non-stenotic Prox LAD lesion was previously treated. Findings: Ao = 167/83 (119) LV = 152/24 RA = 7 RV = 55/9 PA = 58/30 (43) PCW = 25 (v = 37) Fick cardiac output/index = 3.6/2.2 PVR = 5.0 WU Ao sat = 92% PA sat = 53%, 53% Assessment: 1. Stable CAD with patent stents 2. Elevated filling pressures with prominent v-waves in her PCWP tracing consistent with known MR 3. Moderate mixed pulmonary venous/arterial HTN Plan/Discussion: Continue medical therapy. Will give one dose IV lasix prior to d/c. Glori Bickers, MD 12:20 PM  ECHOCARDIOGRAM COMPLETE  Result Date: 07/17/2022    ECHOCARDIOGRAM REPORT   Patient Name:   [facility] Date of Exam: 07/17/2022 Medical Rec #:  [id number]             Height:       63.0 in Accession #:    [id number]            Weight:       131.4 lb Date of Birth:  [birth year]             BSA:          1.617 m Patient Age:    62 years              BP:           135/63 mmHg Patient Gender: F                     HR:           76 bpm. Exam Location:  Outpatient Procedure: 2D Echo, Cardiac Doppler, Color Doppler, 3D Echo and Strain Analysis Indications:     Congenital Heart Disease Q24.0                  Congestive Heart Failure I50.9  History:         Patient has prior history of Echocardiogram examinations, most                  recent 05/08/2022. Idiopathic CMP and CHF, Previous Myocardial                  Infarction and CAD, Arrythmias:LBBB, Signs/Symptoms:Shortness                  of Breath and Chest Pain; Risk Factors:Hypertension,  Dyslipidemia, Diabetes and Sleep Apnea.  Sonographer:     Bernadene Person RDCS Referring Phys:  [id number] Shaune Pascal BENSIMHON Diagnosing Phys: Glori Bickers MD IMPRESSIONS  1. Left ventricular ejection fraction, by estimation, is 20 to 25%. The left ventricle has  severely decreased function. The left ventricle demonstrates global hypokinesis. The left ventricular internal cavity size was moderately dilated. Left ventricular diastolic parameters are consistent with Grade II diastolic dysfunction (pseudonormalization).  2. Right ventricular systolic function is normal. The right ventricular size is normal. There is normal pulmonary artery systolic pressure.  3. Left atrial size was severely dilated.  4. The mitral valve is normal in structure. Severe mitral valve regurgitation. No evidence of mitral stenosis. Moderate mitral annular calcification.  5. The aortic valve is normal in structure. Aortic valve regurgitation is mild. No aortic stenosis is present.  6. The inferior vena cava is dilated in size with >50% respiratory variability, suggesting right atrial pressure of 8 mmHg. FINDINGS  Left Ventricle: Left ventricular ejection fraction, by estimation, is 20 to 25%. The left ventricle has severely decreased function. The left ventricle demonstrates global hypokinesis. The left ventricular internal cavity size was moderately dilated. There is no left ventricular hypertrophy. Left ventricular diastolic parameters are consistent with Grade II diastolic dysfunction (pseudonormalization). Right Ventricle: The right ventricular size is normal. No increase in right ventricular wall thickness. Right ventricular systolic function is normal. There is normal pulmonary artery systolic pressure. The tricuspid regurgitant velocity is 2.82 m/s, and  with an assumed right atrial pressure of 3 mmHg, the estimated right ventricular systolic pressure is 39.7 mmHg. Left Atrium: Left atrial size was severely dilated. Right Atrium: Right atrial size was normal in size. Pericardium: There is no evidence of pericardial effusion. Mitral Valve: The mitral valve is normal in structure. There is mild thickening of the mitral valve leaflet(s). Moderate mitral annular calcification. Severe mitral valve  regurgitation, with centrally-directed jet. No evidence of mitral valve stenosis. Tricuspid Valve: The tricuspid valve is normal in structure. Tricuspid valve regurgitation is mild . No evidence of tricuspid stenosis. Aortic Valve: The aortic valve is normal in structure. Aortic valve regurgitation is mild. Aortic regurgitation PHT measures 318 msec. No aortic stenosis is present. Pulmonic Valve: The pulmonic valve was normal in structure. Pulmonic valve regurgitation is trivial. No evidence of pulmonic stenosis. Aorta: The aortic root is normal in size and structure. Venous: The inferior vena cava is dilated in size with greater than 50% respiratory variability, suggesting right atrial pressure of 8 mmHg. IAS/Shunts: No atrial level shunt detected by color flow Doppler. Additional Comments: A venous catheter is visualized.  LEFT VENTRICLE PLAX 2D LVIDd:         6.10 cm      Diastology LVIDs:         4.70 cm      LV e' medial:    5.15 cm/s LV PW:         1.10 cm      LV E/e' medial:  22.9 LV IVS:        0.80 cm      LV e' lateral:   3.00 cm/s LVOT diam:     1.90 cm      LV E/e' lateral: 39.3 LV SV:         56 LV SV Index:   35 LVOT Area:     2.84 cm  3D Volume EF: LV Volumes (MOD)            3D EF:        30 % LV vol d, MOD A2C: 154.0 ml LV EDV:       230 ml LV vol d, MOD A4C: 155.0 ml LV ESV:       161 ml LV vol s, MOD A2C: 117.0 ml LV SV:        69 ml LV vol s, MOD A4C: 108.0 ml LV SV MOD A2C:     37.0 ml LV SV MOD A4C:     155.0 ml LV SV MOD BP:      40.0 ml RIGHT VENTRICLE RV S prime:     14.30 cm/s TAPSE (M-mode): 1.7 cm LEFT ATRIUM              Index        RIGHT ATRIUM           Index LA diam:        5.20 cm  3.21 cm/m   RA Area:     11.90 cm LA Vol (A2C):   103.0 ml 63.68 ml/m  RA Volume:   24.10 ml  14.90 ml/m LA Vol (A4C):   90.4 ml  55.89 ml/m LA Biplane Vol: 101.0 ml 62.44 ml/m  AORTIC VALVE LVOT Vmax:   102.00 cm/s LVOT Vmean:  65.000 cm/s LVOT VTI:    0.198 m AI PHT:       318 msec  AORTA Ao Root diam: 2.90 cm Ao Asc diam:  2.80 cm MITRAL VALVE                  TRICUSPID VALVE MV Area (PHT): 4.39 cm       TR Peak grad:   31.8 mmHg MV Decel Time: 173 msec       TR Vmax:        282.00 cm/s MR Peak grad:    129.0 mmHg MR Mean grad:    78.0 mmHg    SHUNTS MR Vmax:         568.00 cm/s  Systemic VTI:  0.20 m MR Vmean:        415.0 cm/s   Systemic Diam: 1.90 cm MR PISA:         1.01 cm MR PISA Eff ROA: 7 mm MR PISA Radius:  0.40 cm MV E velocity: 118.00 cm/s MV A velocity: 105.00 cm/s MV E/A ratio:  1.12 Glori Bickers MD Electronically signed by Glori Bickers MD Signature Date/Time: 07/17/2022/2:36:22 PM    Final (Updated)     ASSESSMENT: Anemia, unspecified.  PLAN:    Anemia, unspecified: Patient's hemoglobin remains decreased, but continues to trend up and is now 11.4.  Previously, all of her other laboratory work including iron stores were either negative or within normal limits.  No intervention is needed at this time.  No further follow-up has been scheduled.  Please refer patient back if there are any questions or concerns.   Hypertension: Patient's blood pressure is moderately elevated today.  Continue treatment and follow-up per primary care.   I spent a total of 20 minutes reviewing chart data, face-to-face evaluation with the patient, counseling and coordination of care as detailed above.   Patient expressed understanding and was in agreement with this plan. She also understands that She can call clinic at any time with any questions, concerns, or complaints.    Lloyd Huger, MD   08/16/2022  6:51 AM

## 2022-08-15 ENCOUNTER — Inpatient Hospital Stay (HOSPITAL_BASED_OUTPATIENT_CLINIC_OR_DEPARTMENT_OTHER): Payer: Medicare Other | Admitting: Oncology

## 2022-08-15 ENCOUNTER — Inpatient Hospital Stay: Payer: Medicare Other | Attending: Oncology

## 2022-08-15 ENCOUNTER — Encounter: Payer: Self-pay | Admitting: Oncology

## 2022-08-15 VITALS — BP 152/77 | HR 81 | Temp 98.1°F | Resp 20 | Ht 63.0 in | Wt 130.0 lb

## 2022-08-15 DIAGNOSIS — Z79899 Other long term (current) drug therapy: Secondary | ICD-10-CM | POA: Insufficient documentation

## 2022-08-15 DIAGNOSIS — I10 Essential (primary) hypertension: Secondary | ICD-10-CM | POA: Insufficient documentation

## 2022-08-15 DIAGNOSIS — Z87891 Personal history of nicotine dependence: Secondary | ICD-10-CM | POA: Diagnosis not present

## 2022-08-15 DIAGNOSIS — D649 Anemia, unspecified: Secondary | ICD-10-CM | POA: Insufficient documentation

## 2022-08-15 LAB — CBC WITH DIFFERENTIAL/PLATELET
Abs Immature Granulocytes: 0.02 10*3/uL (ref 0.00–0.07)
Basophils Absolute: 0 10*3/uL (ref 0.0–0.1)
Basophils Relative: 1 %
Eosinophils Absolute: 0.1 10*3/uL (ref 0.0–0.5)
Eosinophils Relative: 1 %
HCT: 35.1 % — ABNORMAL LOW (ref 36.0–46.0)
Hemoglobin: 11.4 g/dL — ABNORMAL LOW (ref 12.0–15.0)
Immature Granulocytes: 0 %
Lymphocytes Relative: 16 %
Lymphs Abs: 1.2 10*3/uL (ref 0.7–4.0)
MCH: 27.3 pg (ref 26.0–34.0)
MCHC: 32.5 g/dL (ref 30.0–36.0)
MCV: 84 fL (ref 80.0–100.0)
Monocytes Absolute: 1 10*3/uL (ref 0.1–1.0)
Monocytes Relative: 12 %
Neutro Abs: 5.5 10*3/uL (ref 1.7–7.7)
Neutrophils Relative %: 70 %
Platelets: 271 10*3/uL (ref 150–400)
RBC: 4.18 MIL/uL (ref 3.87–5.11)
RDW: 14.8 % (ref 11.5–15.5)
WBC: 7.8 10*3/uL (ref 4.0–10.5)
nRBC: 0 % (ref 0.0–0.2)

## 2022-08-23 DIAGNOSIS — Z23 Encounter for immunization: Secondary | ICD-10-CM | POA: Diagnosis not present

## 2022-08-24 DIAGNOSIS — H401132 Primary open-angle glaucoma, bilateral, moderate stage: Secondary | ICD-10-CM | POA: Diagnosis not present

## 2022-08-24 DIAGNOSIS — H35371 Puckering of macula, right eye: Secondary | ICD-10-CM | POA: Diagnosis not present

## 2022-08-27 ENCOUNTER — Other Ambulatory Visit: Payer: Self-pay | Admitting: Family Medicine

## 2022-08-27 DIAGNOSIS — I639 Cerebral infarction, unspecified: Secondary | ICD-10-CM

## 2022-08-29 DIAGNOSIS — Z79899 Other long term (current) drug therapy: Secondary | ICD-10-CM | POA: Diagnosis not present

## 2022-08-29 DIAGNOSIS — F251 Schizoaffective disorder, depressive type: Secondary | ICD-10-CM | POA: Diagnosis not present

## 2022-08-29 DIAGNOSIS — H401132 Primary open-angle glaucoma, bilateral, moderate stage: Secondary | ICD-10-CM | POA: Diagnosis not present

## 2022-09-04 ENCOUNTER — Other Ambulatory Visit: Payer: Self-pay | Admitting: Cardiovascular Disease

## 2022-09-04 DIAGNOSIS — F251 Schizoaffective disorder, depressive type: Secondary | ICD-10-CM | POA: Diagnosis not present

## 2022-09-04 DIAGNOSIS — Z79899 Other long term (current) drug therapy: Secondary | ICD-10-CM | POA: Diagnosis not present

## 2022-09-04 LAB — INTELLIGEN MYELOID

## 2022-09-05 DIAGNOSIS — Z79899 Other long term (current) drug therapy: Secondary | ICD-10-CM | POA: Diagnosis not present

## 2022-09-05 DIAGNOSIS — F251 Schizoaffective disorder, depressive type: Secondary | ICD-10-CM | POA: Diagnosis not present

## 2022-09-10 ENCOUNTER — Other Ambulatory Visit (HOSPITAL_COMMUNITY): Payer: Self-pay | Admitting: Neurology

## 2022-09-10 DIAGNOSIS — G3184 Mild cognitive impairment, so stated: Secondary | ICD-10-CM | POA: Diagnosis not present

## 2022-09-10 DIAGNOSIS — G4733 Obstructive sleep apnea (adult) (pediatric): Secondary | ICD-10-CM | POA: Diagnosis not present

## 2022-09-10 DIAGNOSIS — Z79899 Other long term (current) drug therapy: Secondary | ICD-10-CM | POA: Diagnosis not present

## 2022-09-10 DIAGNOSIS — Z8659 Personal history of other mental and behavioral disorders: Secondary | ICD-10-CM | POA: Diagnosis not present

## 2022-09-10 DIAGNOSIS — I10 Essential (primary) hypertension: Secondary | ICD-10-CM | POA: Diagnosis not present

## 2022-09-12 ENCOUNTER — Telehealth: Payer: Self-pay | Admitting: Licensed Clinical Social Worker

## 2022-09-12 NOTE — Telephone Encounter (Signed)
Called to discuss options with patient.   Left message to call back.

## 2022-09-12 NOTE — Telephone Encounter (Signed)
H&V Care Navigation CSW Progress Note  Clinical Social Worker contacted patient by phone to discuss caregiver options for patient's husband while patient has procedure.  Patient is participating in a Managed Medicaid Plan:  no  CSW referred to discuss options for patient's husband who has dementia. Patient states she has no one to assist her with her husband who she is the primary caregiver while having her procedure. Patient states "they told me he could stay with me at the hospital". CSW discussed concerns of her husband with dementia who would be alone in the waiting area while she is having the procedure and then concerns of her recovery while in the hospital room caring for him if needed. CSW shared concerns of over stimulating her husband with all the activity in the hospital setting. Patient is adamant that she would like him with her. CSW encouraged her to explore a neighbor or friend who might be able to stay with him. Patient states she will ask her neighbor and discuss further with Lenice Llamas, RN. CSW available to discuss further and offer additional resources pending conversation with RN staff. Raquel Sarna, LCSW, CCSW-MCS [phone]   SDOH Screenings   Food Insecurity: No Food Insecurity (03/30/2022)  Housing: Low Risk  (03/30/2022)  Transportation Needs: No Transportation Needs (03/30/2022)  Depression (PHQ2-9): Low Risk  (05/07/2022)  Financial Resource Strain: Low Risk  (03/30/2022)  Social Connections: Unknown (03/30/2022)  Stress: No Stress Concern Present (03/30/2022)  Tobacco Use: Medium Risk (08/15/2022)

## 2022-09-13 NOTE — Telephone Encounter (Signed)
Spoke with the patient at length.  Talked about options for her husband. While the hospital allows 1 overnight visitor, Ms. Dimaria is her husband's primary caregiver as he has dementia. She said she was able to plan for a friend to stay with him while she is in surgery but she will be with him alone after the procedure. Reiterated to her that she will be on bedrest for some time and will be unable to help him if needed. Reiterated to her that there will be flashing lights, blood pressure cuffs, monitor sounds and interruptions by staff throughout the night that may be frustrating for both her husband and her.  Informed Ms. Becvar that her procedure will be last case of the day so she can be home until late in the morning with a 1030 arrival time. She was very happy about this to give her more time with her dog and husband before her procedure. Because of this, she agreed to have a sitter come to their home to be with him while she is in the hospital. Informed her Kennyth Lose will reach out to her in the next couple days as she has resources to help arrange/plan for a sitter.  The patient understands I will call her next week to confirm PAT/pre-procedure appointments (will try to do same day to avoid two visits).  She was grateful for assistance.

## 2022-09-14 ENCOUNTER — Telehealth (HOSPITAL_COMMUNITY): Payer: Self-pay | Admitting: Licensed Clinical Social Worker

## 2022-09-14 NOTE — Telephone Encounter (Signed)
H&V Care Navigation CSW Progress Note  Clinical Social Worker contacted patient by phone to follow up on caregiver for her husband during patient procedure.  Patient is participating in a Managed Medicaid Plan:  no  Patient is a 71 yo married female who need to have a procedure which will require an overnight hospitalization. Patient's husband has dementia and unable to remain home alone. Patient exploring all options and states she has no family or friends to stay with her husband and initially thought she could just bring him to the hospital with her. After lengthy conversations, Ms Skerritt is open to having an aide/sitter for private hire to stay with her husband while she is in the hospital. CSW provided some resources and possible options.   CSW spoke with Alger Simons at [employer] who will contact patient to discuss further and patient will return call to [place]. Raquel Sarna, LCSW, CCSW-MCS [phone]   SDOH Screenings   Food Insecurity: No Food Insecurity (03/30/2022)  Housing: Low Risk  (03/30/2022)  Transportation Needs: No Transportation Needs (03/30/2022)  Depression (PHQ2-9): Low Risk  (05/07/2022)  Financial Resource Strain: Low Risk  (03/30/2022)  Social Connections: Unknown (03/30/2022)  Stress: No Stress Concern Present (03/30/2022)  Tobacco Use: Medium Risk (08/15/2022)

## 2022-09-17 ENCOUNTER — Telehealth: Payer: Self-pay | Admitting: Family Medicine

## 2022-09-17 NOTE — Telephone Encounter (Signed)
I called the patient to see what labs she was speaking of and she stated her normal labs, kidney function, sugar and cholesterol and I informed her that her insurance would not pay for labs but every three months and she understood and thought it was time.  Naithen [place],cma

## 2022-09-17 NOTE — Telephone Encounter (Signed)
Pt called stating she would like to get lab work done

## 2022-09-18 NOTE — Telephone Encounter (Signed)
CSW contacted patient to follow up on resources provided last week to obtain a caregiver for her husband while she has an overnight procedure the end of the month. Patient states she was able to work out a plan with a friend and a paid caregiver through [employer]. She denies any concerns and has a plan in place for her husband. CSW provided contact information for furture need and encouraged patient to call if needed. Patient grateful for the support and assistance. Raquel Sarna, [place], [place]

## 2022-09-19 DIAGNOSIS — Z79899 Other long term (current) drug therapy: Secondary | ICD-10-CM | POA: Diagnosis not present

## 2022-09-19 DIAGNOSIS — F251 Schizoaffective disorder, depressive type: Secondary | ICD-10-CM | POA: Diagnosis not present

## 2022-09-20 NOTE — Telephone Encounter (Signed)
Confirmed with Short Stay the patient may have same day testing to avoid 2 trips prior to procedure.  Confirmed appointment with Kathyrn Drown, NP on 10/01/2022. She understands she will get an EKG, labs drawn, special soap, and instruction letter for surgery on 11/30. She was grateful for call and agreed with plan.

## 2022-09-24 ENCOUNTER — Ambulatory Visit: Payer: Medicare Other | Admitting: Neurology

## 2022-09-24 DIAGNOSIS — F251 Schizoaffective disorder, depressive type: Secondary | ICD-10-CM | POA: Diagnosis not present

## 2022-09-24 DIAGNOSIS — Z79899 Other long term (current) drug therapy: Secondary | ICD-10-CM | POA: Diagnosis not present

## 2022-09-25 ENCOUNTER — Ambulatory Visit (INDEPENDENT_AMBULATORY_CARE_PROVIDER_SITE_OTHER): Payer: Medicare Other | Admitting: Family Medicine

## 2022-09-25 ENCOUNTER — Encounter: Payer: Self-pay | Admitting: Neurology

## 2022-09-25 ENCOUNTER — Encounter: Payer: Self-pay | Admitting: Family Medicine

## 2022-09-25 VITALS — BP 118/68 | HR 77 | Temp 98.1°F | Ht 63.0 in | Wt 130.6 lb

## 2022-09-25 DIAGNOSIS — F331 Major depressive disorder, recurrent, moderate: Secondary | ICD-10-CM

## 2022-09-25 DIAGNOSIS — I255 Ischemic cardiomyopathy: Secondary | ICD-10-CM | POA: Diagnosis not present

## 2022-09-25 DIAGNOSIS — F419 Anxiety disorder, unspecified: Secondary | ICD-10-CM

## 2022-09-25 DIAGNOSIS — J309 Allergic rhinitis, unspecified: Secondary | ICD-10-CM | POA: Insufficient documentation

## 2022-09-25 DIAGNOSIS — J00 Acute nasopharyngitis [common cold]: Secondary | ICD-10-CM | POA: Insufficient documentation

## 2022-09-25 DIAGNOSIS — R413 Other amnesia: Secondary | ICD-10-CM | POA: Diagnosis not present

## 2022-09-25 DIAGNOSIS — G3184 Mild cognitive impairment, so stated: Secondary | ICD-10-CM

## 2022-09-25 HISTORY — DX: Allergic rhinitis, unspecified: J30.9

## 2022-09-25 MED ORDER — AZELASTINE HCL 0.1 % NA SOLN: 2.0000 | Freq: Two times a day (BID) | NASAL | 12 refills | Status: DC

## 2022-09-25 NOTE — Assessment & Plan Note (Signed)
She will continue to follow with psychiatry. 

## 2022-09-25 NOTE — Assessment & Plan Note (Signed)
Symptoms are most consistent with allergic rhinitis.  She will trial Astelin nasal spray to sprays each nostril twice daily.  If not beneficial she will let us know.

## 2022-09-25 NOTE — Progress Notes (Signed)
Tommi Rumps, MD Phone: [phone]  Melanie Ray is a 71 y.o. female who presents today for follow-up.  Respiratory symptoms: Patient notes she had COVID back in July.  Since then she has had issues with cough in the morning and at night associated with phlegm in her throat and rhinorrhea and sneezing.  She has no history of allergies.  She has no congestion, fevers, postnasal drip, or sore throat.  She is not on any allergy medications.  Memory impairment: Patient saw neurology previously for this.  They felt as though her memory impairment was related to significant depression.  They ordered neuropsychological testing to evaluate for underlying dementia though patient has not been scheduled for that.  Depression/anxiety: She continues to follow with psychiatry every 6 weeks.  She is on Caplyta, trazodone, and venlafaxine.  She is no longer on lorazepam.  She notes having thoughts of dying though no active suicidal thoughts.  Social History   Tobacco Use  Smoking Status Former   Types: Cigarettes   Quit date: 04/13/1997   Years since quitting: 25.4  Smokeless Tobacco Never    Current Outpatient Medications on File Prior to Visit  Medication Sig Dispense Refill   atorvastatin (LIPITOR) 80 MG tablet Take 1 tablet by mouth once daily 90 tablet 3   carvedilol (COREG) 12.5 MG tablet TAKE 1 TABLET BY MOUTH TWICE DAILY WITH MEALS 60 tablet 9   clopidogrel (PLAVIX) 75 MG tablet Take 1 tablet by mouth once daily 90 tablet 0   dorzolamide-timolol (COSOPT) 22.3-6.8 MG/ML ophthalmic solution Place 1 drop into both eyes 2 (two) times daily.      furosemide (LASIX) 20 MG tablet Take 20 mg by mouth every other day.     isosorbide mononitrate (IMDUR) 30 MG 24 hr tablet Take 1 tablet by mouth once daily 90 tablet 3   latanoprost (XALATAN) 0.005 % ophthalmic solution Place 1 drop into both eyes at bedtime.     lumateperone tosylate (CAPLYTA) 42 MG capsule Take 42 mg by mouth daily.      nitroGLYCERIN (NITROSTAT) 0.4 MG SL tablet DISSOLVE ONE TABLET UNDER THE TONGUE EVERY FIVE MINUTES AS NEEDED FOR CHEST PAIN. DO NOT EXCEED A TOTAL OF THREE DOSES IN 15 MINUTES 25 tablet 4   traZODone (DESYREL) 150 MG tablet Take 150 mg by mouth at bedtime.     valsartan (DIOVAN) 80 MG tablet Take 80 mg by mouth daily.     venlafaxine XR (EFFEXOR-XR) 150 MG 24 hr capsule TAKE 2 CAPSULES BY MOUTH ONCE DAILY (NEEDS to follow-up with psychiatry for further refills) 60 capsule 0   No current facility-administered medications on file prior to visit.     ROS see history of present illness  Objective  Physical Exam Vitals:   09/25/22 1140  BP: 118/68  Pulse: 77  Temp: 98.1 F (36.7 C)  SpO2: 99%    BP Readings from Last 3 Encounters:  09/25/22 118/68  08/15/22 (!) 152/77  08/08/22 136/72   Wt Readings from Last 3 Encounters:  09/25/22 130 lb 9.6 oz (59.2 kg)  08/15/22 130 lb (59 kg)  08/08/22 131 lb (59.4 kg)    Physical Exam Constitutional:      General: She is not in acute distress.    Appearance: She is not diaphoretic.  HENT:     Mouth/Throat:     Mouth: Mucous membranes are moist.     Pharynx: Oropharynx is clear.  Cardiovascular:     Rate and Rhythm: Normal rate  and regular rhythm.     Heart sounds: Normal heart sounds.  Pulmonary:     Effort: Pulmonary effort is normal.     Breath sounds: Normal breath sounds.  Skin:    General: Skin is warm and dry.  Neurological:     Mental Status: She is alert.      Assessment/Plan: Please see individual problem list.  Problem List Items Addressed This Visit     Allergic rhinitis - Primary (Chronic)    Symptoms are most consistent with allergic rhinitis.  She will trial Astelin nasal spray to sprays each nostril twice daily.  If not beneficial she will let us know.      Relevant Medications   azelastine (ASTELIN) 0.1 % nasal spray   Anxiety (Chronic)    She will continue to follow with psychiatry.       Depression (Chronic)    She will continue to follow with psychiatry.      Memory difficulty (Chronic)    Likely related to her depression.  She was provided with the phone number to call to follow-up on the neuropsychological testing with neurology.          Return in about 3 months (around 12/26/2022).   Tommi Rumps, MD [place]

## 2022-09-25 NOTE — Assessment & Plan Note (Signed)
Likely related to her depression.  She was provided with the phone number to call to follow-up on the neuropsychological testing with neurology.

## 2022-09-26 ENCOUNTER — Encounter: Payer: Self-pay | Admitting: Neurology

## 2022-09-26 DIAGNOSIS — F251 Schizoaffective disorder, depressive type: Secondary | ICD-10-CM | POA: Diagnosis not present

## 2022-09-26 DIAGNOSIS — I10 Essential (primary) hypertension: Secondary | ICD-10-CM

## 2022-09-26 DIAGNOSIS — Z79899 Other long term (current) drug therapy: Secondary | ICD-10-CM | POA: Diagnosis not present

## 2022-10-01 ENCOUNTER — Other Ambulatory Visit: Payer: Self-pay | Admitting: Neurology

## 2022-10-01 ENCOUNTER — Other Ambulatory Visit: Payer: Self-pay | Admitting: Cardiology

## 2022-10-01 ENCOUNTER — Ambulatory Visit (INDEPENDENT_AMBULATORY_CARE_PROVIDER_SITE_OTHER): Payer: Medicare Other | Admitting: Cardiology

## 2022-10-01 ENCOUNTER — Other Ambulatory Visit (HOSPITAL_COMMUNITY): Payer: Medicare Other

## 2022-10-01 VITALS — BP 142/90 | HR 72 | Ht 63.0 in | Wt 132.2 lb

## 2022-10-01 DIAGNOSIS — Z954 Presence of other heart-valve replacement: Secondary | ICD-10-CM | POA: Diagnosis not present

## 2022-10-01 DIAGNOSIS — I110 Hypertensive heart disease with heart failure: Secondary | ICD-10-CM | POA: Diagnosis not present

## 2022-10-01 DIAGNOSIS — E119 Type 2 diabetes mellitus without complications: Secondary | ICD-10-CM | POA: Diagnosis not present

## 2022-10-01 DIAGNOSIS — I10 Essential (primary) hypertension: Secondary | ICD-10-CM

## 2022-10-01 DIAGNOSIS — Z823 Family history of stroke: Secondary | ICD-10-CM | POA: Diagnosis not present

## 2022-10-01 DIAGNOSIS — Z818 Family history of other mental and behavioral disorders: Secondary | ICD-10-CM | POA: Diagnosis not present

## 2022-10-01 DIAGNOSIS — I447 Left bundle-branch block, unspecified: Secondary | ICD-10-CM | POA: Diagnosis not present

## 2022-10-01 DIAGNOSIS — I3139 Other pericardial effusion (noninflammatory): Secondary | ICD-10-CM | POA: Diagnosis not present

## 2022-10-01 DIAGNOSIS — Z95810 Presence of automatic (implantable) cardiac defibrillator: Secondary | ICD-10-CM | POA: Diagnosis not present

## 2022-10-01 DIAGNOSIS — Z833 Family history of diabetes mellitus: Secondary | ICD-10-CM | POA: Diagnosis not present

## 2022-10-01 DIAGNOSIS — I340 Nonrheumatic mitral (valve) insufficiency: Secondary | ICD-10-CM | POA: Diagnosis not present

## 2022-10-01 DIAGNOSIS — Z01810 Encounter for preprocedural cardiovascular examination: Secondary | ICD-10-CM | POA: Diagnosis not present

## 2022-10-01 DIAGNOSIS — I509 Heart failure, unspecified: Secondary | ICD-10-CM | POA: Diagnosis not present

## 2022-10-01 DIAGNOSIS — G4730 Sleep apnea, unspecified: Secondary | ICD-10-CM | POA: Diagnosis not present

## 2022-10-01 DIAGNOSIS — I5043 Acute on chronic combined systolic (congestive) and diastolic (congestive) heart failure: Secondary | ICD-10-CM | POA: Diagnosis not present

## 2022-10-01 DIAGNOSIS — E782 Mixed hyperlipidemia: Secondary | ICD-10-CM | POA: Diagnosis not present

## 2022-10-01 DIAGNOSIS — Z7902 Long term (current) use of antithrombotics/antiplatelets: Secondary | ICD-10-CM | POA: Diagnosis not present

## 2022-10-01 DIAGNOSIS — Z01818 Encounter for other preprocedural examination: Secondary | ICD-10-CM

## 2022-10-01 DIAGNOSIS — Z006 Encounter for examination for normal comparison and control in clinical research program: Secondary | ICD-10-CM | POA: Diagnosis not present

## 2022-10-01 DIAGNOSIS — F418 Other specified anxiety disorders: Secondary | ICD-10-CM | POA: Diagnosis not present

## 2022-10-01 DIAGNOSIS — Z79899 Other long term (current) drug therapy: Secondary | ICD-10-CM | POA: Diagnosis not present

## 2022-10-01 DIAGNOSIS — Z8249 Family history of ischemic heart disease and other diseases of the circulatory system: Secondary | ICD-10-CM | POA: Diagnosis not present

## 2022-10-01 DIAGNOSIS — Z87891 Personal history of nicotine dependence: Secondary | ICD-10-CM | POA: Diagnosis not present

## 2022-10-01 DIAGNOSIS — I252 Old myocardial infarction: Secondary | ICD-10-CM | POA: Diagnosis not present

## 2022-10-01 DIAGNOSIS — I2510 Atherosclerotic heart disease of native coronary artery without angina pectoris: Secondary | ICD-10-CM | POA: Diagnosis not present

## 2022-10-01 DIAGNOSIS — Z20822 Contact with and (suspected) exposure to covid-19: Secondary | ICD-10-CM | POA: Diagnosis not present

## 2022-10-01 DIAGNOSIS — I255 Ischemic cardiomyopathy: Secondary | ICD-10-CM | POA: Diagnosis not present

## 2022-10-01 DIAGNOSIS — Z955 Presence of coronary angioplasty implant and graft: Secondary | ICD-10-CM | POA: Diagnosis not present

## 2022-10-01 DIAGNOSIS — I081 Rheumatic disorders of both mitral and tricuspid valves: Secondary | ICD-10-CM | POA: Diagnosis not present

## 2022-10-01 NOTE — Progress Notes (Signed)
[facility]                                     Cardiology Office Note:    Date:  10/01/2022   ID:  Melanie Ray, DOB 05-08-1951, MRN [id number]  PCP:  Leone Haven, MD  [facility] Cardiologist:  Sherren Mocha, MD  [facility] Electrophysiologist:  Vickie Epley, MD   Referring MD: Leone Haven, MD   Chief Complaint  Patient presents with   Follow-up    Pre TEER    History of Present Illness:    Melanie Ray is a 71 y.o. female with a hx of CAD s/p Anterior STEMI 5/17 with PCI/DES x 2 to LAD, HFrEF/ischemic cardiomyopathy with LVEF at 20-25%, DM2, HTN, HLD, LBBB with CRT-D placement 01/02/22, OSA, and severe mitral regurgitation who was referred to Dr. Burt Knack for TEER scheduled for 10/04/22.   [name] is followed by Dr. Haroldine Laws for her cardiology care. She has a long hx of ischemic cardiomyopathy not responsive to CRT therapy. She underwent an echocardiogram 07/17/2022 showing an LVEF at 20 to 25%, normal RV function, and severe central mitral regurgitation. Subsequent LHC and TEE demonstrated continued patency of her LAD stents with residual nonobstructive disease. She was noted to have elevated diastolic filling pressures with prominent V waves of 37 mmHg consistent with hemodynamically significant mitral regurgitation. TEE demonstrated severe LV dysfunction with an LVEF of 20 to 25%, normal RV size with moderately reduced RV function, severe left atrial enlargement, and moderately severe mitral regurgitation with annular dilatation and restriction of the posterior leaflet. The mitral regurgitation is graded at 3+.  She was referred to the Structural Heart team and was seen by Dr. Burt Knack 08/08/22 with symptoms of persistent SOB, NYHA Class II symptoms. Plan was to proceed with TEER however she was noted to have some social circumstances that she needs to account for including care of her  husband who has dementia.   She is here today and reports that she has been feeling well with no chest pain, palpitations, LE edema, orthopnea,or syncope. She does have intermittent orthostatic dizziness however she changes position slowly which helps. She states that her husband will stay at home while she is hospitalized and her neighbors will check on him. She will be taking the car keys so that he cannot drive on his own.   Past Medical History:  Diagnosis Date   Anemia    Anxiety    Back pain    CAD (coronary artery disease) 04/11/2016   S/p ant STEMI 5/17: LHC >> LAD proximal 80%, mid 80%, distal 50%, ostial D1 60%; LCx with LPDA lesion 30%; RCA Mild calcification with no significant stenosis in a medium caliber, nondominant RCA; LVEF is estimated at 45% with inferoapical and lateral wall akinesis >> PCI: PCI: 3.5 x 24 mm Promus DES to prox LAD, 2.5 x 12 mm Promus DES to mid LAD. // Myoview 7/21: EF 20, no ischemia; high risk    Chest pain    Chronic systolic CHF (congestive heart failure) ([place]) 03/21/2016   Echo 01/30/17: Diff HK, mild focal basal septal hypertrophy, EF 30-35, mild AI, MAC, mild MR // Echo 06/08/16: Mild focal basal septal hypertrophy, EF 25-30%, diff HK, ant-septal AK, Gr 1 DD, mild AI, MAC, mild MR, PASP 37 mmHg // Echo 03/18/16: EF 30-35%,  ant-septal AK, Gr 1 DD, mild MR, severe LAE.   Constipation    Depression    Diabetes mellitus type 2 in obese (HCC)    Dizziness    Glaucoma    Gout    Heart disease    Heartburn    History of acute anterior wall MI 03/17/2016   History of heart attack    Hyperlipemia    Hypertension    Ischemic cardiomyopathy 10/02/2016   Refused ICD   Joint pain    Left bundle branch block    Myocardial infarction ([place])    Nausea    Positive colorectal cancer screening using Cologuard test 03/01/2020   Sleep apnea    SOB (shortness of breath)    Suicidal ideation 08/27/2018   Swelling    feet or legs    Past Surgical History:   Procedure Laterality Date   APPENDECTOMY     BIV ICD INSERTION CRT-D N/A 01/02/2022   Procedure: BIV ICD INSERTION CRT-D;  Surgeon: Vickie Epley, MD;  Location: [facility];  Service: Cardiovascular;  Laterality: N/A;   CARDIAC CATHETERIZATION N/A 03/17/2016   Procedure: Left Heart Cath and Coronary Angiography;  Surgeon: Sherren Mocha, MD;  Location: [facility];  Service: Cardiovascular;  Laterality: N/A;   CARDIAC CATHETERIZATION N/A 03/17/2016   Procedure: Coronary Stent Intervention;  Surgeon: Sherren Mocha, MD;  Location: [facility] [facility];  Service: Cardiovascular;  Laterality: N/A;   COLONOSCOPY WITH PROPOFOL N/A 08/13/2017   Procedure: COLONOSCOPY WITH PROPOFOL;  Surgeon: Jonathon Bellows, MD;  Location: [facility] ENDOSCOPY;  Service: Gastroenterology;  Laterality: N/A;   COLONOSCOPY WITH PROPOFOL N/A 03/29/2020   Procedure: COLONOSCOPY WITH PROPOFOL;  Surgeon: Lucilla Lame, MD;  Location: [facility] ENDOSCOPY;  Service: Endoscopy;  Laterality: N/A;   RIGHT/LEFT HEART CATH AND CORONARY ANGIOGRAPHY N/A 08/02/2022   Procedure: RIGHT/LEFT HEART CATH AND CORONARY ANGIOGRAPHY;  Surgeon: Jolaine Artist, MD;  Location: [facility];  Service: Cardiovascular;  Laterality: N/A;   SMALL BOWEL REPAIR     TEE WITHOUT CARDIOVERSION N/A 08/02/2022   Procedure: TRANSESOPHAGEAL ECHOCARDIOGRAM (TEE);  Surgeon: Jolaine Artist, MD;  Location: [facility] ENDOSCOPY;  Service: Cardiovascular;  Laterality: N/A;   TONSILLECTOMY     UTERINE FIBROID SURGERY      Current Medications: Current Meds  Medication Sig   atorvastatin (LIPITOR) 80 MG tablet Take 1 tablet by mouth once daily   azelastine (ASTELIN) 0.1 % nasal spray Place 2 sprays into both nostrils 2 (two) times daily. Use in each nostril as directed   carvedilol (COREG) 12.5 MG tablet TAKE 1 TABLET BY MOUTH TWICE DAILY WITH MEALS   clopidogrel (PLAVIX) 75 MG tablet Take 1 tablet by mouth once daily   dorzolamide-timolol (COSOPT) 22.3-6.8  MG/ML ophthalmic solution Place 1 drop into both eyes 2 (two) times daily.    furosemide (LASIX) 20 MG tablet Take 20 mg by mouth every other day.   isosorbide mononitrate (IMDUR) 30 MG 24 hr tablet Take 1 tablet by mouth once daily   latanoprost (XALATAN) 0.005 % ophthalmic solution Place 1 drop into both eyes at bedtime.   lumateperone tosylate (CAPLYTA) 42 MG capsule Take 42 mg by mouth daily.   nitroGLYCERIN (NITROSTAT) 0.4 MG SL tablet DISSOLVE ONE TABLET UNDER THE TONGUE EVERY FIVE MINUTES AS NEEDED FOR CHEST PAIN. DO NOT EXCEED A TOTAL OF THREE DOSES IN 15 MINUTES   traZODone (DESYREL) 150 MG tablet Take 150 mg by mouth at bedtime.   valsartan (  DIOVAN) 80 MG tablet Take 80 mg by mouth daily.   venlafaxine XR (EFFEXOR-XR) 150 MG 24 hr capsule TAKE 2 CAPSULES BY MOUTH ONCE DAILY (NEEDS to follow-up with psychiatry for further refills)     Allergies:   Tetracycline, Meperidine, and Entresto [sacubitril-valsartan]   Social History   Socioeconomic History   Marital status: Married    Spouse name: Deone Omahoney   Number of children: 0   Years of education: BS in education   Highest education level: Not on file  Occupational History   Occupation:  Retired Education officer, museum  Tobacco Use   Smoking status: Former    Types: Cigarettes    Quit date: 04/13/1997    Years since quitting: 25.4   Smokeless tobacco: Never  Vaping Use   Vaping Use: Never used  Substance and Sexual Activity   Alcohol use: Not Currently   Drug use: No   Sexual activity: Not Currently  Other Topics Concern   Not on file  Social History Narrative   Lives in [place] with spouse.  No children.   Retired first [employer] for over 30 years ([place] for 10 years and then in [place] for over 20 years).   Left-handed   Lives in a two story home       Social Determinants of Health   Financial Resource Strain: Low Risk  (03/30/2022)   Overall Financial Resource Strain (CARDIA)    Difficulty of Paying  Living Expenses: Not hard at all  Food Insecurity: No Food Insecurity (03/30/2022)   Hunger Vital Sign    Worried About Running Out of Food in the Last Year: Never true    Ran Out of Food in the Last Year: Never true  Transportation Needs: No Transportation Needs (03/30/2022)   PRAPARE - Hydrologist (Medical): No    Lack of Transportation (Non-Medical): No  Physical Activity: Not on file  Stress: No Stress Concern Present (03/30/2022)   [place]    Feeling of Stress : Not at all  Social Connections: Unknown (03/30/2022)   Social Connection and Isolation Panel [NHANES]    Frequency of Communication with Friends and Family: Not on file    Frequency of Social Gatherings with Friends and Family: Not on file    Attends Religious Services: Not on file    Active Member of Clubs or Organizations: Not on file    Attends Archivist Meetings: Not on file    Marital Status: Married     Family History: The patient's family history includes Anxiety disorder in her maternal aunt and mother; Dementia in her mother; Depression in her mother; Diabetes in her mother; Drug abuse in her cousin; High Cholesterol in her father and mother; Hyperlipidemia in her mother; Hypertension in her father and mother; Mood Disorder in her sister; Paranoid behavior in her mother; Stroke in her sister; Tuberculosis in her paternal grandfather.  ROS:   Please see the history of present illness.    All other systems reviewed and are negative.  EKGs/Labs/Other Studies Reviewed:    The following studies were reviewed today:  Echocardiogram 07/17/2022: 1. Left ventricular ejection fraction, by estimation, is 20 to 25%. The  left ventricle has severely decreased function. The left ventricle  demonstrates global hypokinesis. The left ventricular internal cavity size  was moderately dilated. Left  ventricular diastolic  parameters are consistent with Grade II diastolic  dysfunction (pseudonormalization).   2.  Right ventricular systolic function is normal. The right ventricular  size is normal. There is normal pulmonary artery systolic pressure.   3. Left atrial size was severely dilated.   4. The mitral valve is normal in structure. Severe mitral valve  regurgitation. No evidence of mitral stenosis. Moderate mitral annular  calcification.   5. The aortic valve is normal in structure. Aortic valve regurgitation is  mild. No aortic stenosis is present.   6. The inferior vena cava is dilated in size with >50% respiratory  variability, suggesting right atrial pressure of 8 mmHg.    TEE 08/02/2022: 1. Left ventricular ejection fraction, by estimation, is 20 to 25%. The  left ventricle has severely decreased function. The left ventricular  internal cavity size was moderately dilated.   2. Right ventricular systolic function is moderately reduced. The right  ventricular size is normal.   3. Left atrial size was severely dilated. No left atrial/left atrial  appendage thrombus was detected.   4. Right atrial size was mildly dilated.   5. MV is decgenative. Mildly thickened. Posterior leaflet restricted. 3+  central MR. The mitral valve is degenerative. Moderate mitral valve  regurgitation.   6. Tricuspid valve regurgitation is mild to moderate.   7. The aortic valve is tricuspid. Aortic valve regurgitation is mild.   8. There is mild (Grade II) plaque.    EKG:  EKG is ordered today.  The ekg ordered today demonstrates NSR with A-pacing and LBBB, HR 712bpm.   Recent Labs: 12/19/2021: Pro B Natriuretic peptide (BNP) 2,342.0 07/06/2022: ALT 17 07/17/2022: B Natriuretic Peptide 2,184.7; BUN 19; Creatinine, Ser 1.13 08/02/2022: Potassium 3.5; Potassium 3.6; Sodium 146; Sodium 145 08/15/2022: Hemoglobin 11.4; Platelets 271   Recent Lipid Panel    Component Value Date/Time   CHOL 139 12/19/2021 1107   CHOL 112  04/18/2020 0815   TRIG 71.0 12/19/2021 1107   HDL 41.60 12/19/2021 1107   HDL 39 (L) 04/18/2020 0815   CHOLHDL 3 12/19/2021 1107   VLDL 14.2 12/19/2021 1107   LDLCALC 83 12/19/2021 1107   LDLCALC 59 04/18/2020 0815   LDLDIRECT 78.0 02/07/2018 0928   Physical Exam:    VS:  BP (!) 142/90   Pulse 72   Ht _0  (1.6 m)   Wt 132 lb 3.2 oz (60 kg)   SpO2 98%   BMI 23.42 kg/m     Wt Readings from Last 3 Encounters:  10/01/22 132 lb 3.2 oz (60 kg)  09/25/22 130 lb 9.6 oz (59.2 kg)  08/15/22 130 lb (59 kg)    General: Well developed, well nourished, NAD Lungs:Clear to ausculation bilaterally. No wheezes, rales, or rhonchi. Breathing is unlabored. Cardiovascular: RRR with S1 S2. + MR murmurs Extremities: No edema. Neuro: Alert and oriented. No focal deficits. No facial asymmetry. MAE spontaneously. Psych: Responds to questions appropriately with normal affect.    ASSESSMENT/PLAN:   Severe mitral regurgitation: TEE demonstrated severe LV dysfunction with an LVEF of 20 to 25%, normal RV size with moderately reduced RV function, severe left atrial enlargement, and moderately severe mitral regurgitation with annular dilatation and restriction of the posterior leaflet. The mitral regurgitation is graded at 3+. Doing well today with NYHA class II symptoms. Seen by Dr. Burt Knack with plans for TEER 10/04/22. Obtain CBC, CMET, BNP, and INR. Pre procedure instructions reviewed with all questions answered. CHG soap given.    CAD: s/p anterior STEMI 5/17 with PCI/DES x 2 to LAD. TEER workup with LHC 08/02/22 with patent  stents and otherwise non-obstructive disease. Denies chest pain. Plan to continue Plavix post TEER.   HFrEF/ischemic cardiomyopathy: LVEF at 20-25% on most recent TEE despite CRT therapy. Continue current regimen with valsartan, carvedilol, Imdur, and lasix. Appears euvolemic today.   HTN: Elevated today at 142/90. Will continue to watch s/p TEER and make adjustments if needed at that  time. Continue carvedilol, valsartan.   HLD: Continue Lipitor   Known LBBB: s/p CRT-D placement 01/02/22 with most recent TEE 08/02/22 with LVEF at 20-25%.    Medication Adjustments/Labs and Tests Ordered: Current medicines are reviewed at length with the patient today.  Concerns regarding medicines are outlined above.  Orders Placed This Encounter  Procedures   Comprehensive metabolic panel   CBC   Pro b natriuretic peptide (BNP)   Protime-INR   EKG 12-Lead   No orders of the defined types were placed in this encounter.   Patient Instructions  Medication Instructions:  Your physician recommends that you continue on your current medications as directed. Please refer to the Current Medication list given to you today.  *If you need a refill on your cardiac medications before your next appointment, please call your pharmacy*   Lab Work: CMET, CBC, BNP, INR today If you have labs (blood work) drawn today and your tests are completely normal, you will receive your results only by: [place] (if you have MyChart) OR A paper copy in the mail If you have any lab test that is abnormal or we need to change your treatment, we will call you to review the results.   Testing/Procedures: MitraClip procedure   Follow-Up: At [facility], you and your health needs are our priority.  As part of our continuing mission to provide you with exceptional heart care, we have created designated Provider Care Teams.  These Care Teams include your primary Cardiologist (physician) and Advanced Practice Providers (APPs -  Physician Assistants and Nurse Practitioners) who all work together to provide you with the care you need, when you need it.  Your next appointment:   Structural Team will follow-up  The format for your next appointment:   In Person  Provider:   Kathyrn Drown, NP       Important Information About Sugar         Signed, Kathyrn Drown, NP  10/01/2022 12:33  PM    [place]

## 2022-10-01 NOTE — Patient Instructions (Signed)
Medication Instructions:  Your physician recommends that you continue on your current medications as directed. Please refer to the Current Medication list given to you today.  *If you need a refill on your cardiac medications before your next appointment, please call your pharmacy*   Lab Work: CMET, CBC, BNP, INR today If you have labs (blood work) drawn today and your tests are completely normal, you will receive your results only by: [place] (if you have MyChart) OR A paper copy in the mail If you have any lab test that is abnormal or we need to change your treatment, we will call you to review the results.   Testing/Procedures: MitraClip procedure   Follow-Up: At [facility], you and your health needs are our priority.  As part of our continuing mission to provide you with exceptional heart care, we have created designated Provider Care Teams.  These Care Teams include your primary Cardiologist (physician) and Advanced Practice Providers (APPs -  Physician Assistants and Nurse Practitioners) who all work together to provide you with the care you need, when you need it.  Your next appointment:   Structural Team will follow-up  The format for your next appointment:   In Person  Provider:   Kathyrn Drown, NP       Important Information About Sugar

## 2022-10-01 NOTE — H&P (View-Only) (Signed)
[facility]   MULTIDISCIPLINARY HEART VALVE CLINIC                                     Cardiology Office Note:    Date:  10/01/2022   ID:  Melanie Ray, DOB 05/24/1951, MRN [id number]  PCP:  Sonnenberg, Eric G, MD  [facility] Cardiologist:  Michael Cooper, MD  [facility] Electrophysiologist:  CAMERON T LAMBERT, MD   Referring MD: Sonnenberg, Eric G, MD   Chief Complaint  Patient presents with   Follow-up    Pre TEER    History of Present Illness:    Melanie Ray is a 71 y.o. female with a hx of CAD s/p Anterior STEMI 5/17 with PCI/DES x 2 to LAD, HFrEF/ischemic cardiomyopathy with LVEF at 20-25%, DM2, HTN, HLD, LBBB with CRT-D placement 01/02/22, OSA, and severe mitral regurgitation who was referred to Dr. Cooper for TEER scheduled for 10/04/22.   Ms. Croke is followed by Dr. Bensimhon for her cardiology care. She has a long hx of ischemic cardiomyopathy not responsive to CRT therapy. She underwent an echocardiogram 07/17/2022 showing an LVEF at 20 to 25%, normal RV function, and severe central mitral regurgitation. Subsequent LHC and TEE demonstrated continued patency of her LAD stents with residual nonobstructive disease. She was noted to have elevated diastolic filling pressures with prominent V waves of 37 mmHg consistent with hemodynamically significant mitral regurgitation. TEE demonstrated severe LV dysfunction with an LVEF of 20 to 25%, normal RV size with moderately reduced RV function, severe left atrial enlargement, and moderately severe mitral regurgitation with annular dilatation and restriction of the posterior leaflet. The mitral regurgitation is graded at 3+.  She was referred to the Structural Heart team and was seen by Dr. Cooper 08/08/22 with symptoms of persistent SOB, NYHA Class II symptoms. Plan was to proceed with TEER however she was noted to have some social circumstances that she needs to account for including care of her  husband who has dementia.   She is here today and reports that she has been feeling well with no chest pain, palpitations, LE edema, orthopnea,or syncope. She does have intermittent orthostatic dizziness however she changes position slowly which helps. She states that her husband will stay at home while she is hospitalized and her neighbors will check on him. She will be taking the car keys so that he cannot drive on his own.   Past Medical History:  Diagnosis Date   Anemia    Anxiety    Back pain    CAD (coronary artery disease) 04/11/2016   S/p ant STEMI 5/17: LHC >> LAD proximal 80%, mid 80%, distal 50%, ostial D1 60%; LCx with LPDA lesion 30%; RCA Mild calcification with no significant stenosis in a medium caliber, nondominant RCA; LVEF is estimated at 45% with inferoapical and lateral wall akinesis >> PCI: PCI: 3.5 x 24 mm Promus DES to prox LAD, 2.5 x 12 mm Promus DES to mid LAD. // Myoview 7/21: EF 20, no ischemia; high risk    Chest pain    Chronic systolic CHF (congestive heart failure) (HCC) 03/21/2016   Echo 01/30/17: Diff HK, mild focal basal septal hypertrophy, EF 30-35, mild AI, MAC, mild MR // Echo 06/08/16: Mild focal basal septal hypertrophy, EF 25-30%, diff HK, ant-septal AK, Gr 1 DD, mild AI, MAC, mild MR, PASP 37 mmHg // Echo 03/18/16: EF 30-35%,   ant-septal AK, Gr 1 DD, mild MR, severe LAE.   Constipation    Depression    Diabetes mellitus type 2 in obese (HCC)    Dizziness    Glaucoma    Gout    Heart disease    Heartburn    History of acute anterior wall MI 03/17/2016   History of heart attack    Hyperlipemia    Hypertension    Ischemic cardiomyopathy 10/02/2016   Refused ICD   Joint pain    Left bundle branch block    Myocardial infarction ([place])    Nausea    Positive colorectal cancer screening using Cologuard test 03/01/2020   Sleep apnea    SOB (shortness of breath)    Suicidal ideation 08/27/2018   Swelling    feet or legs    Past Surgical History:   Procedure Laterality Date   APPENDECTOMY     BIV ICD INSERTION CRT-D N/A 01/02/2022   Procedure: BIV ICD INSERTION CRT-D;  Surgeon: Vickie Epley, MD;  Location: [place] [facility];  Service: Cardiovascular;  Laterality: N/A;   CARDIAC CATHETERIZATION N/A 03/17/2016   Procedure: Left Heart Cath and Coronary Angiography;  Surgeon: Sherren Mocha, MD;  Location: [place] [facility];  Service: Cardiovascular;  Laterality: N/A;   CARDIAC CATHETERIZATION N/A 03/17/2016   Procedure: Coronary Stent Intervention;  Surgeon: Sherren Mocha, MD;  Location: [place] [facility];  Service: Cardiovascular;  Laterality: N/A;   COLONOSCOPY WITH PROPOFOL N/A 08/13/2017   Procedure: COLONOSCOPY WITH PROPOFOL;  Surgeon: Jonathon Bellows, MD;  Location[facility] ENDOSCOPY;  Service: Gastroenterology;  Laterality: N/A;   COLONOSCOPY WITH PROPOFOL N/A 03/29/2020   Procedure: COLONOSCOPY WITH PROPOFOL;  Surgeon: Lucilla Lame, MD;  Location: [facility] ENDOSCOPY;  Service: Endoscopy;  Laterality: N/A;   RIGHT/LEFT HEART CATH AND CORONARY ANGIOGRAPHY N/A 08/02/2022   Procedure: RIGHT/LEFT HEART CATH AND CORONARY ANGIOGRAPHY;  Surgeon: Jolaine Artist, MD;  Location: [place] [facility];  Service: Cardiovascular;  Laterality: N/A;   SMALL BOWEL REPAIR     TEE WITHOUT CARDIOVERSION N/A 08/02/2022   Procedure: TRANSESOPHAGEAL ECHOCARDIOGRAM (TEE);  Surgeon: Jolaine Artist, MD;  Location: [facility] ENDOSCOPY;  Service: Cardiovascular;  Laterality: N/A;   TONSILLECTOMY     UTERINE FIBROID SURGERY      Current Medications: Current Meds  Medication Sig   atorvastatin (LIPITOR) 80 MG tablet Take 1 tablet by mouth once daily   azelastine (ASTELIN) 0.1 % nasal spray Place 2 sprays into both nostrils 2 (two) times daily. Use in each nostril as directed   carvedilol (COREG) 12.5 MG tablet TAKE 1 TABLET BY MOUTH TWICE DAILY WITH MEALS   clopidogrel (PLAVIX) 75 MG tablet Take 1 tablet by mouth once daily   dorzolamide-timolol (COSOPT) 22.3-6.8  MG/ML ophthalmic solution Place 1 drop into both eyes 2 (two) times daily.    furosemide (LASIX) 20 MG tablet Take 20 mg by mouth every other day.   isosorbide mononitrate (IMDUR) 30 MG 24 hr tablet Take 1 tablet by mouth once daily   latanoprost (XALATAN) 0.005 % ophthalmic solution Place 1 drop into both eyes at bedtime.   lumateperone tosylate (CAPLYTA) 42 MG capsule Take 42 mg by mouth daily.   nitroGLYCERIN (NITROSTAT) 0.4 MG SL tablet DISSOLVE ONE TABLET UNDER THE TONGUE EVERY FIVE MINUTES AS NEEDED FOR CHEST PAIN. DO NOT EXCEED A TOTAL OF THREE DOSES IN 15 MINUTES   traZODone (DESYREL) 150 MG tablet Take 150 mg by mouth at bedtime.   valsartan (  DIOVAN) 80 MG tablet Take 80 mg by mouth daily.   venlafaxine XR (EFFEXOR-XR) 150 MG 24 hr capsule TAKE 2 CAPSULES BY MOUTH ONCE DAILY (NEEDS to follow-up with psychiatry for further refills)     Allergies:   Tetracycline, Meperidine, and Entresto [sacubitril-valsartan]   Social History   Socioeconomic History   Marital status: Married    Spouse name: Anthony Pickney   Number of children: 0   Years of education: BS in education   Highest education level: Not on file  Occupational History   Occupation:  Retired School teacher  Tobacco Use   Smoking status: Former    Types: Cigarettes    Quit date: 04/13/1997    Years since quitting: 25.4   Smokeless tobacco: Never  Vaping Use   Vaping Use: Never used  Substance and Sexual Activity   Alcohol use: Not Currently   Drug use: No   Sexual activity: Not Currently  Other Topics Concern   Not on file  Social History Narrative   Lives in [place] with spouse.  No children.   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   Left-handed   Lives in a two story home       Social Determinants of Health   Financial Resource Strain: Low Risk  (03/30/2022)   Overall Financial Resource Strain (CARDIA)    Difficulty of Paying  Living Expenses: Not hard at all  Food Insecurity: No Food Insecurity (03/30/2022)   Hunger Vital Sign    Worried About Running Out of Food in the Last Year: Never true    Ran Out of Food in the Last Year: Never true  Transportation Needs: No Transportation Needs (03/30/2022)   PRAPARE - Transportation    Lack of Transportation (Medical): No    Lack of Transportation (Non-Medical): No  Physical Activity: Not on file  Stress: No Stress Concern Present (03/30/2022)   [employer] - Occupational Stress Questionnaire    Feeling of Stress : Not at all  Social Connections: Unknown (03/30/2022)   Social Connection and Isolation Panel [NHANES]    Frequency of Communication with Friends and Family: Not on file    Frequency of Social Gatherings with Friends and Family: Not on file    Attends Religious Services: Not on file    Active Member of Clubs or Organizations: Not on file    Attends Club or Organization Meetings: Not on file    Marital Status: Married     Family History: The patient's family history includes Anxiety disorder in her maternal aunt and mother; Dementia in her mother; Depression in her mother; Diabetes in her mother; Drug abuse in her cousin; High Cholesterol in her father and mother; Hyperlipidemia in her mother; Hypertension in her father and mother; Mood Disorder in her sister; Paranoid behavior in her mother; Stroke in her sister; Tuberculosis in her paternal grandfather.  ROS:   Please see the history of present illness.    All other systems reviewed and are negative.  EKGs/Labs/Other Studies Reviewed:    The following studies were reviewed today:  Echocardiogram 07/17/2022: 1. Left ventricular ejection fraction, by estimation, is 20 to 25%. The  left ventricle has severely decreased function. The left ventricle  demonstrates global hypokinesis. The left ventricular internal cavity size  was moderately dilated. Left  ventricular diastolic  parameters are consistent with Grade II diastolic  dysfunction (pseudonormalization).   2.   Right ventricular systolic function is normal. The right ventricular  size is normal. There is normal pulmonary artery systolic pressure.   3. Left atrial size was severely dilated.   4. The mitral valve is normal in structure. Severe mitral valve  regurgitation. No evidence of mitral stenosis. Moderate mitral annular  calcification.   5. The aortic valve is normal in structure. Aortic valve regurgitation is  mild. No aortic stenosis is present.   6. The inferior vena cava is dilated in size with >50% respiratory  variability, suggesting right atrial pressure of 8 mmHg.    TEE 08/02/2022: 1. Left ventricular ejection fraction, by estimation, is 20 to 25%. The  left ventricle has severely decreased function. The left ventricular  internal cavity size was moderately dilated.   2. Right ventricular systolic function is moderately reduced. The right  ventricular size is normal.   3. Left atrial size was severely dilated. No left atrial/left atrial  appendage thrombus was detected.   4. Right atrial size was mildly dilated.   5. MV is decgenative. Mildly thickened. Posterior leaflet restricted. 3+  central MR. The mitral valve is degenerative. Moderate mitral valve  regurgitation.   6. Tricuspid valve regurgitation is mild to moderate.   7. The aortic valve is tricuspid. Aortic valve regurgitation is mild.   8. There is mild (Grade II) plaque.    EKG:  EKG is ordered today.  The ekg ordered today demonstrates NSR with A-pacing and LBBB, HR 712bpm.   Recent Labs: 12/19/2021: Pro B Natriuretic peptide (BNP) 2,342.0 07/06/2022: ALT 17 07/17/2022: B Natriuretic Peptide 2,184.7; BUN 19; Creatinine, Ser 1.13 08/02/2022: Potassium 3.5; Potassium 3.6; Sodium 146; Sodium 145 08/15/2022: Hemoglobin 11.4; Platelets 271   Recent Lipid Panel    Component Value Date/Time   CHOL 139 12/19/2021 1107   CHOL 112  04/18/2020 0815   TRIG 71.0 12/19/2021 1107   HDL 41.60 12/19/2021 1107   HDL 39 (L) 04/18/2020 0815   CHOLHDL 3 12/19/2021 1107   VLDL 14.2 12/19/2021 1107   LDLCALC 83 12/19/2021 1107   LDLCALC 59 04/18/2020 0815   LDLDIRECT 78.0 02/07/2018 0928   Physical Exam:    VS:  BP (!) 142/90   Pulse 72   Ht _0  (1.6 m)   Wt 132 lb 3.2 oz (60 kg)   SpO2 98%   BMI 23.42 kg/m     Wt Readings from Last 3 Encounters:  10/01/22 132 lb 3.2 oz (60 kg)  09/25/22 130 lb 9.6 oz (59.2 kg)  08/15/22 130 lb (59 kg)    General: Well developed, well nourished, NAD Lungs:Clear to ausculation bilaterally. No wheezes, rales, or rhonchi. Breathing is unlabored. Cardiovascular: RRR with S1 S2. + MR murmurs Extremities: No edema. Neuro: Alert and oriented. No focal deficits. No facial asymmetry. MAE spontaneously. Psych: Responds to questions appropriately with normal affect.    ASSESSMENT/PLAN:   Severe mitral regurgitation: TEE demonstrated severe LV dysfunction with an LVEF of 20 to 25%, normal RV size with moderately reduced RV function, severe left atrial enlargement, and moderately severe mitral regurgitation with annular dilatation and restriction of the posterior leaflet. The mitral regurgitation is graded at 3+. Doing well today with NYHA class II symptoms. Seen by Dr. Burt Knack with plans for TEER 10/04/22. Obtain CBC, CMET, BNP, and INR. Pre procedure instructions reviewed with all questions answered. CHG soap given.    CAD: s/p anterior STEMI 5/17 with PCI/DES x 2 to LAD. TEER workup with LHC 08/02/22 with patent  stents and otherwise non-obstructive disease. Denies chest pain. Plan to continue Plavix post TEER.   HFrEF/ischemic cardiomyopathy: LVEF at 20-25% on most recent TEE despite CRT therapy. Continue current regimen with valsartan, carvedilol, Imdur, and lasix. Appears euvolemic today.   HTN: Elevated today at 142/90. Will continue to watch s/p TEER and make adjustments if needed at that  time. Continue carvedilol, valsartan.   HLD: Continue Lipitor   Known LBBB: s/p CRT-D placement 01/02/22 with most recent TEE 08/02/22 with LVEF at 20-25%.    Medication Adjustments/Labs and Tests Ordered: Current medicines are reviewed at length with the patient today.  Concerns regarding medicines are outlined above.  Orders Placed This Encounter  Procedures   Comprehensive metabolic panel   CBC   Pro b natriuretic peptide (BNP)   Protime-INR   EKG 12-Lead   No orders of the defined types were placed in this encounter.   Patient Instructions  Medication Instructions:  Your physician recommends that you continue on your current medications as directed. Please refer to the Current Medication list given to you today.  *If you need a refill on your cardiac medications before your next appointment, please call your pharmacy*   Lab Work: CMET, CBC, BNP, INR today If you have labs (blood work) drawn today and your tests are completely normal, you will receive your results only by: MyChart Message (if you have MyChart) OR A paper copy in the mail If you have any lab test that is abnormal or we need to change your treatment, we will call you to review the results.   Testing/Procedures: MitraClip procedure   Follow-Up: At [facility], you and your health needs are our priority.  As part of our continuing mission to provide you with exceptional heart care, we have created designated Provider Care Teams.  These Care Teams include your primary Cardiologist (physician) and Advanced Practice Providers (APPs -  Physician Assistants and Nurse Practitioners) who all work together to provide you with the care you need, when you need it.  Your next appointment:   Structural Team will follow-up  The format for your next appointment:   In Person  Provider:   Adlee Paar, NP       Important Information About Sugar         Signed, Beautiful Pensyl, NP  10/01/2022 12:33  PM    [facility] 

## 2022-10-02 LAB — CBC
Hematocrit: 34.8 % (ref 34.0–46.6)
Hemoglobin: 11.2 g/dL (ref 11.1–15.9)
MCH: 27 pg (ref 26.6–33.0)
MCHC: 32.2 g/dL (ref 31.5–35.7)
MCV: 84 fL (ref 79–97)
Platelets: 249 10*3/uL (ref 150–450)
RBC: 4.15 x10E6/uL (ref 3.77–5.28)
RDW: 13.3 % (ref 11.7–15.4)
WBC: 6.5 10*3/uL (ref 3.4–10.8)

## 2022-10-02 LAB — COMPREHENSIVE METABOLIC PANEL
ALT: 37 IU/L — ABNORMAL HIGH (ref 0–32)
AST: 27 IU/L (ref 0–40)
Albumin/Globulin Ratio: 1.7 (ref 1.2–2.2)
Albumin: 3.9 g/dL (ref 3.8–4.8)
Alkaline Phosphatase: 117 IU/L (ref 44–121)
BUN/Creatinine Ratio: 24 (ref 12–28)
BUN: 24 mg/dL (ref 8–27)
Bilirubin Total: 0.4 mg/dL (ref 0.0–1.2)
CO2: 22 mmol/L (ref 20–29)
Calcium: 9.2 mg/dL (ref 8.7–10.3)
Chloride: 109 mmol/L — ABNORMAL HIGH (ref 96–106)
Creatinine, Ser: 1.01 mg/dL — ABNORMAL HIGH (ref 0.57–1.00)
Globulin, Total: 2.3 g/dL (ref 1.5–4.5)
Glucose: 85 mg/dL (ref 70–99)
Potassium: 4 mmol/L (ref 3.5–5.2)
Sodium: 143 mmol/L (ref 134–144)
Total Protein: 6.2 g/dL (ref 6.0–8.5)
eGFR: 59 mL/min/{1.73_m2} — ABNORMAL LOW (ref >59)

## 2022-10-02 LAB — PROTIME-INR
INR: 1.1 (ref 0.9–1.2)
Prothrombin Time: 11.4 s (ref 9.1–12.0)

## 2022-10-02 LAB — PRO B NATRIURETIC PEPTIDE: NT-Pro BNP: 13775 pg/mL — ABNORMAL HIGH (ref 0–301)

## 2022-10-03 NOTE — Progress Notes (Addendum)
[employer] rep [employer] [phone] notified of the pt's surgery and arrival time for tomorrow 10/04/22. Device orders also sent to [facility].

## 2022-10-04 ENCOUNTER — Inpatient Hospital Stay (HOSPITAL_COMMUNITY): Payer: Medicare Other | Admitting: Certified Registered"

## 2022-10-04 ENCOUNTER — Encounter (HOSPITAL_COMMUNITY): Payer: Self-pay | Admitting: Cardiovascular Disease

## 2022-10-04 ENCOUNTER — Encounter (HOSPITAL_COMMUNITY)
Admission: RE | Disposition: A | Discharge status: Home / Self Care | Payer: Self-pay | Source: Home / Self Care | Attending: Cardiovascular Disease

## 2022-10-04 ENCOUNTER — Encounter: Payer: Self-pay | Admitting: Cardiology

## 2022-10-04 ENCOUNTER — Inpatient Hospital Stay (HOSPITAL_COMMUNITY): Payer: Medicare Other

## 2022-10-04 ENCOUNTER — Other Ambulatory Visit: Payer: Self-pay | Admitting: Cardiology

## 2022-10-04 ENCOUNTER — Other Ambulatory Visit: Payer: Self-pay

## 2022-10-04 ENCOUNTER — Inpatient Hospital Stay (HOSPITAL_COMMUNITY)
Admission: RE | Admit: 2022-10-04 | Discharge: 2022-10-05 | DRG: 266 | Disposition: A | Discharge status: Home / Self Care | Payer: Medicare Other | Attending: Cardiovascular Disease | Admitting: Cardiovascular Disease

## 2022-10-04 DIAGNOSIS — Z7902 Long term (current) use of antithrombotics/antiplatelets: Secondary | ICD-10-CM | POA: Diagnosis not present

## 2022-10-04 DIAGNOSIS — Z823 Family history of stroke: Secondary | ICD-10-CM | POA: Diagnosis not present

## 2022-10-04 DIAGNOSIS — I255 Ischemic cardiomyopathy: Secondary | ICD-10-CM | POA: Diagnosis present

## 2022-10-04 DIAGNOSIS — Z95810 Presence of automatic (implantable) cardiac defibrillator: Secondary | ICD-10-CM | POA: Diagnosis present

## 2022-10-04 DIAGNOSIS — Z006 Encounter for examination for normal comparison and control in clinical research program: Secondary | ICD-10-CM

## 2022-10-04 DIAGNOSIS — Z8249 Family history of ischemic heart disease and other diseases of the circulatory system: Secondary | ICD-10-CM | POA: Diagnosis not present

## 2022-10-04 DIAGNOSIS — Z01818 Encounter for other preprocedural examination: Secondary | ICD-10-CM | POA: Diagnosis not present

## 2022-10-04 DIAGNOSIS — I447 Left bundle-branch block, unspecified: Secondary | ICD-10-CM | POA: Diagnosis present

## 2022-10-04 DIAGNOSIS — Z818 Family history of other mental and behavioral disorders: Secondary | ICD-10-CM | POA: Diagnosis not present

## 2022-10-04 DIAGNOSIS — I340 Nonrheumatic mitral (valve) insufficiency: Principal | ICD-10-CM | POA: Diagnosis present

## 2022-10-04 DIAGNOSIS — Z833 Family history of diabetes mellitus: Secondary | ICD-10-CM

## 2022-10-04 DIAGNOSIS — Z79899 Other long term (current) drug therapy: Secondary | ICD-10-CM | POA: Diagnosis not present

## 2022-10-04 DIAGNOSIS — Z87891 Personal history of nicotine dependence: Secondary | ICD-10-CM

## 2022-10-04 DIAGNOSIS — I252 Old myocardial infarction: Secondary | ICD-10-CM | POA: Diagnosis not present

## 2022-10-04 DIAGNOSIS — E119 Type 2 diabetes mellitus without complications: Secondary | ICD-10-CM | POA: Diagnosis present

## 2022-10-04 DIAGNOSIS — Z9889 Other specified postprocedural states: Principal | ICD-10-CM

## 2022-10-04 DIAGNOSIS — E785 Hyperlipidemia, unspecified: Secondary | ICD-10-CM | POA: Diagnosis present

## 2022-10-04 DIAGNOSIS — Z955 Presence of coronary angioplasty implant and graft: Secondary | ICD-10-CM | POA: Diagnosis not present

## 2022-10-04 DIAGNOSIS — E782 Mixed hyperlipidemia: Secondary | ICD-10-CM | POA: Diagnosis present

## 2022-10-04 DIAGNOSIS — Z954 Presence of other heart-valve replacement: Secondary | ICD-10-CM | POA: Diagnosis not present

## 2022-10-04 DIAGNOSIS — I110 Hypertensive heart disease with heart failure: Secondary | ICD-10-CM | POA: Diagnosis present

## 2022-10-04 DIAGNOSIS — Z95818 Presence of other cardiac implants and grafts: Secondary | ICD-10-CM

## 2022-10-04 DIAGNOSIS — F418 Other specified anxiety disorders: Secondary | ICD-10-CM | POA: Diagnosis not present

## 2022-10-04 DIAGNOSIS — I10 Essential (primary) hypertension: Secondary | ICD-10-CM | POA: Diagnosis present

## 2022-10-04 DIAGNOSIS — I3139 Other pericardial effusion (noninflammatory): Secondary | ICD-10-CM | POA: Diagnosis not present

## 2022-10-04 DIAGNOSIS — I5043 Acute on chronic combined systolic (congestive) and diastolic (congestive) heart failure: Secondary | ICD-10-CM | POA: Diagnosis present

## 2022-10-04 DIAGNOSIS — G4730 Sleep apnea, unspecified: Secondary | ICD-10-CM | POA: Diagnosis not present

## 2022-10-04 DIAGNOSIS — I509 Heart failure, unspecified: Secondary | ICD-10-CM | POA: Diagnosis not present

## 2022-10-04 DIAGNOSIS — I2510 Atherosclerotic heart disease of native coronary artery without angina pectoris: Secondary | ICD-10-CM

## 2022-10-04 DIAGNOSIS — Z20822 Contact with and (suspected) exposure to covid-19: Secondary | ICD-10-CM | POA: Diagnosis present

## 2022-10-04 DIAGNOSIS — R7303 Prediabetes: Secondary | ICD-10-CM

## 2022-10-04 DIAGNOSIS — I081 Rheumatic disorders of both mitral and tricuspid valves: Secondary | ICD-10-CM | POA: Diagnosis not present

## 2022-10-04 HISTORY — PX: TEE WITHOUT CARDIOVERSION: SHX5443

## 2022-10-04 HISTORY — DX: Other specified postprocedural states: Z98.890

## 2022-10-04 HISTORY — PX: MITRAL VALVE REPAIR: CATH118311

## 2022-10-04 LAB — GLUCOSE, CAPILLARY
Glucose-Capillary: 105 mg/dL — ABNORMAL HIGH (ref 70–99)
Glucose-Capillary: 186 mg/dL — ABNORMAL HIGH (ref 70–99)
Glucose-Capillary: 138 mg/dL — ABNORMAL HIGH (ref 70–99)
Glucose-Capillary: 196 mg/dL — ABNORMAL HIGH (ref 70–99)

## 2022-10-04 LAB — ECHO TEE
MV M vel: 4.46 m/s
MV Peak grad: 79.6 mmHg
Radius: 0.5 cm

## 2022-10-04 LAB — CBC
HCT: 37.6 % (ref 36.0–46.0)
Hemoglobin: 12.4 g/dL (ref 12.0–15.0)
MCH: 27.6 pg (ref 26.0–34.0)
MCHC: 33 g/dL (ref 30.0–36.0)
MCV: 83.6 fL (ref 80.0–100.0)
Platelets: 240 10*3/uL (ref 150–400)
RBC: 4.5 MIL/uL (ref 3.87–5.11)
RDW: 14 % (ref 11.5–15.5)
WBC: 7.2 10*3/uL (ref 4.0–10.5)
nRBC: 0 % (ref 0.0–0.2)

## 2022-10-04 LAB — TYPE AND SCREEN
ABO/RH(D): O POS
Antibody Screen: NEGATIVE

## 2022-10-04 LAB — CREATININE, SERUM
Creatinine, Ser: 1.14 mg/dL — ABNORMAL HIGH (ref 0.44–1.00)
GFR, Estimated: 51 mL/min — ABNORMAL LOW (ref >60)

## 2022-10-04 LAB — SURGICAL PCR SCREEN
MRSA, PCR: NEGATIVE
Staphylococcus aureus: NEGATIVE

## 2022-10-04 LAB — POCT ACTIVATED CLOTTING TIME
Activated Clotting Time: 266 seconds
Activated Clotting Time: 266 seconds

## 2022-10-04 LAB — ABO/RH: ABO/RH(D): O POS

## 2022-10-04 LAB — SARS CORONAVIRUS 2 BY RT PCR: SARS Coronavirus 2 by RT PCR: NEGATIVE

## 2022-10-04 SURGERY — MITRAL VALVE REPAIR
Anesthesia: General

## 2022-10-04 MED ORDER — LIDOCAINE-EPINEPHRINE 1 %-1:100000 IJ SOLN
INTRAMUSCULAR | Status: AC
  Filled 2022-10-04: qty 1

## 2022-10-04 MED ORDER — HEPARIN (PORCINE) IN NACL 2000-0.9 UNIT/L-% IV SOLN
INTRAVENOUS | Status: AC
  Filled 2022-10-04: qty 1000

## 2022-10-04 MED ORDER — LATANOPROST 0.005 % OP SOLN
1.0000 [drp] | Freq: Every day | OPHTHALMIC | Status: DC
  Administered 2022-10-04: 1 [drp] via OPHTHALMIC
  Filled 2022-10-04: qty 2.5

## 2022-10-04 MED ORDER — ONDANSETRON HCL 4 MG/2ML IJ SOLN
INTRAMUSCULAR | Status: DC | PRN
  Administered 2022-10-04: 4 mg via INTRAVENOUS

## 2022-10-04 MED ORDER — FUROSEMIDE 10 MG/ML IJ SOLN
40.0000 mg | Freq: Once | INTRAMUSCULAR | Status: AC
  Administered 2022-10-05: 40 mg via INTRAVENOUS
  Filled 2022-10-04: qty 4

## 2022-10-04 MED ORDER — CLOPIDOGREL BISULFATE 75 MG PO TABS
75.0000 mg | ORAL_TABLET | Freq: Every day | ORAL | Status: DC
  Administered 2022-10-05: 75 mg via ORAL
  Filled 2022-10-04: qty 1

## 2022-10-04 MED ORDER — ACETAMINOPHEN 325 MG PO TABS: 650.0000 mg | ORAL_TABLET | ORAL | Status: DC | PRN

## 2022-10-04 MED ORDER — LACTATED RINGERS IV SOLN: INTRAVENOUS | Status: DC

## 2022-10-04 MED ORDER — CEFAZOLIN SODIUM-DEXTROSE 2-4 GM/100ML-% IV SOLN
INTRAVENOUS | Status: AC
  Filled 2022-10-04: qty 100

## 2022-10-04 MED ORDER — SUGAMMADEX SODIUM 200 MG/2ML IV SOLN
INTRAVENOUS | Status: DC | PRN
  Administered 2022-10-04: 200 mg via INTRAVENOUS

## 2022-10-04 MED ORDER — CEFAZOLIN SODIUM-DEXTROSE 2-4 GM/100ML-% IV SOLN
2.0000 g | INTRAVENOUS | Status: AC
  Administered 2022-10-04: 2 g via INTRAVENOUS

## 2022-10-04 MED ORDER — HEPARIN SODIUM (PORCINE) 1000 UNIT/ML IJ SOLN
INTRAMUSCULAR | Status: DC | PRN
  Administered 2022-10-04 (×2): 3000 [IU] via INTRAVENOUS
  Administered 2022-10-04: 9000 [IU] via INTRAVENOUS

## 2022-10-04 MED ORDER — INSULIN ASPART 100 UNIT/ML IJ SOLN: 0.0000 [IU] | INTRAMUSCULAR | Status: DC | PRN

## 2022-10-04 MED ORDER — HEPARIN (PORCINE) IN NACL 1000-0.9 UT/500ML-% IV SOLN
INTRAVENOUS | Status: DC | PRN
  Administered 2022-10-04: 500 mL

## 2022-10-04 MED ORDER — SODIUM CHLORIDE 0.9 % IV SOLN: INTRAVENOUS | Status: DC

## 2022-10-04 MED ORDER — SODIUM CHLORIDE 0.9 % IV SOLN: 250.0000 mL | INTRAVENOUS | Status: DC | PRN

## 2022-10-04 MED ORDER — LABETALOL HCL 5 MG/ML IV SOLN
10.0000 mg | INTRAVENOUS | Status: DC | PRN
  Administered 2022-10-05: 10 mg via INTRAVENOUS
  Filled 2022-10-04: qty 4

## 2022-10-04 MED ORDER — ACETAMINOPHEN 500 MG PO TABS
ORAL_TABLET | ORAL | Status: AC
  Administered 2022-10-04: 1000 mg via ORAL
  Filled 2022-10-04: qty 2

## 2022-10-04 MED ORDER — INSULIN ASPART 100 UNIT/ML IJ SOLN: 0.0000 [IU] | Freq: Three times a day (TID) | INTRAMUSCULAR | Status: DC

## 2022-10-04 MED ORDER — TRAZODONE HCL 50 MG PO TABS
150.0000 mg | ORAL_TABLET | Freq: Every day | ORAL | Status: DC
  Administered 2022-10-04: 150 mg via ORAL
  Filled 2022-10-04: qty 3

## 2022-10-04 MED ORDER — CHLORHEXIDINE GLUCONATE 0.12 % MT SOLN: 15.0000 mL | Freq: Once | OROMUCOSAL | Status: AC

## 2022-10-04 MED ORDER — CHLORHEXIDINE GLUCONATE 4 % EX LIQD: 60.0000 mL | Freq: Once | CUTANEOUS | Status: DC

## 2022-10-04 MED ORDER — CHLORHEXIDINE GLUCONATE 0.12 % MT SOLN
OROMUCOSAL | Status: AC
  Administered 2022-10-04: 15 mL via OROMUCOSAL
  Filled 2022-10-04: qty 15

## 2022-10-04 MED ORDER — FUROSEMIDE 10 MG/ML IJ SOLN
INTRAMUSCULAR | Status: AC
  Filled 2022-10-04: qty 4

## 2022-10-04 MED ORDER — ISOSORBIDE MONONITRATE ER 30 MG PO TB24
30.0000 mg | ORAL_TABLET | Freq: Every day | ORAL | Status: DC
  Administered 2022-10-05: 30 mg via ORAL
  Filled 2022-10-04: qty 1

## 2022-10-04 MED ORDER — ETOMIDATE 2 MG/ML IV SOLN
INTRAVENOUS | Status: DC | PRN
  Administered 2022-10-04: 16 mg via INTRAVENOUS

## 2022-10-04 MED ORDER — ACETAMINOPHEN 500 MG PO TABS: 1000.0000 mg | ORAL_TABLET | Freq: Once | ORAL | Status: AC

## 2022-10-04 MED ORDER — CHLORHEXIDINE GLUCONATE 4 % EX LIQD: 30.0000 mL | CUTANEOUS | Status: DC

## 2022-10-04 MED ORDER — FENTANYL CITRATE (PF) 250 MCG/5ML IJ SOLN
INTRAMUSCULAR | Status: DC | PRN
  Administered 2022-10-04: 100 ug via INTRAVENOUS

## 2022-10-04 MED ORDER — LIDOCAINE-EPINEPHRINE 1 %-1:100000 IJ SOLN
30.0000 mL | Freq: Once | INTRAMUSCULAR | Status: AC
  Administered 2022-10-04: 10 mL

## 2022-10-04 MED ORDER — HEPARIN SODIUM (PORCINE) 5000 UNIT/ML IJ SOLN: 5000.0000 [IU] | Freq: Three times a day (TID) | INTRAMUSCULAR | Status: DC

## 2022-10-04 MED ORDER — GUAIFENESIN 100 MG/5ML PO LIQD
5.0000 mL | ORAL | Status: DC | PRN
  Administered 2022-10-04 – 2022-10-05 (×2): 5 mL via ORAL
  Filled 2022-10-04 (×2): qty 10

## 2022-10-04 MED ORDER — SODIUM CHLORIDE 0.9% FLUSH
3.0000 mL | Freq: Two times a day (BID) | INTRAVENOUS | Status: DC
  Administered 2022-10-04 – 2022-10-05 (×2): 3 mL via INTRAVENOUS

## 2022-10-04 MED ORDER — HEPARIN (PORCINE) IN NACL 2000-0.9 UNIT/L-% IV SOLN
INTRAVENOUS | Status: DC | PRN
  Administered 2022-10-04 (×3): 1000 mL

## 2022-10-04 MED ORDER — LIDOCAINE 2% (20 MG/ML) 5 ML SYRINGE
INTRAMUSCULAR | Status: DC | PRN
  Administered 2022-10-04: 80 mg via INTRAVENOUS

## 2022-10-04 MED ORDER — ATORVASTATIN CALCIUM 80 MG PO TABS
80.0000 mg | ORAL_TABLET | Freq: Every day | ORAL | Status: DC
  Administered 2022-10-05: 80 mg via ORAL
  Filled 2022-10-04: qty 1

## 2022-10-04 MED ORDER — IRBESARTAN 150 MG PO TABS
75.0000 mg | ORAL_TABLET | Freq: Every day | ORAL | Status: DC
  Administered 2022-10-04 – 2022-10-05 (×2): 75 mg via ORAL
  Filled 2022-10-04 (×2): qty 1

## 2022-10-04 MED ORDER — DEXAMETHASONE SODIUM PHOSPHATE 10 MG/ML IJ SOLN
INTRAMUSCULAR | Status: DC | PRN
  Administered 2022-10-04: 4 mg via INTRAVENOUS

## 2022-10-04 MED ORDER — HYDRALAZINE HCL 20 MG/ML IJ SOLN: 5.0000 mg | INTRAMUSCULAR | Status: DC | PRN

## 2022-10-04 MED ORDER — ROCURONIUM BROMIDE 10 MG/ML (PF) SYRINGE
PREFILLED_SYRINGE | INTRAVENOUS | Status: DC | PRN
  Administered 2022-10-04: 60 mg via INTRAVENOUS
  Administered 2022-10-04: 10 mg via INTRAVENOUS

## 2022-10-04 MED ORDER — FUROSEMIDE 10 MG/ML IJ SOLN
INTRAMUSCULAR | Status: DC | PRN
  Administered 2022-10-04: 40 mg via INTRAMUSCULAR

## 2022-10-04 MED ORDER — CARVEDILOL 12.5 MG PO TABS
12.5000 mg | ORAL_TABLET | Freq: Two times a day (BID) | ORAL | Status: DC
  Administered 2022-10-05: 12.5 mg via ORAL
  Filled 2022-10-04: qty 1

## 2022-10-04 MED ORDER — SODIUM CHLORIDE 0.9% FLUSH: 3.0000 mL | INTRAVENOUS | Status: DC | PRN

## 2022-10-04 SURGICAL SUPPLY — 19 items
CATH MITRA STEERABLE GUIDE (CATHETERS) IMPLANT
CLIP MITRA G4 DELIVERY SYS NT (Clip) IMPLANT
CLIP MITRA G4 DELIVERY SYS NTW (Clip) IMPLANT
CLOSURE PERCLOSE PROSTYLE (VASCULAR PRODUCTS) IMPLANT
KIT HEART LEFT (KITS) ×2 IMPLANT
KIT VERSACROSS LRG ACCESS (CATHETERS) IMPLANT
PACK CARDIAC CATHETERIZATION (CUSTOM PROCEDURE TRAY) ×1 IMPLANT
SHEATH DILAT COONS TAPER 22F (SHEATH) IMPLANT
SHEATH PINNACLE 8F 10CM (SHEATH) IMPLANT
SHEATH PROBE COVER 6X72 (BAG) ×1 IMPLANT
SHIELD RADPAD SCOOP 12X17 (MISCELLANEOUS) IMPLANT
STOPCOCK MORSE 400PSI 3WAY (MISCELLANEOUS) ×6 IMPLANT
SYSTEM MITRACLIP G4 (SYSTAGENIX WOUND MANAGEMENT) IMPLANT
TRANSDUCER W/STOPCOCK (MISCELLANEOUS) ×1 IMPLANT
TUBING ART PRESS 72  MALE/FEM (TUBING) ×1
TUBING ART PRESS 72 MALE/FEM (TUBING) ×1 IMPLANT
WIRE EMERALD 3MM-J .035X150CM (WIRE) IMPLANT
WIRE MICRO SET SILHO 5FR 7 (SHEATH) IMPLANT
WIRE SAFARI SM CURVE 275 (WIRE) IMPLANT

## 2022-10-04 NOTE — Anesthesia Procedure Notes (Signed)
Procedure Name: Intubation Date/Time: 10/04/2022 1:02 PM  Performed by: Anastasio Auerbach, CRNAPre-anesthesia Checklist: Patient identified, Emergency Drugs available, Suction available and Patient being monitored Patient Re-evaluated:Patient Re-evaluated prior to induction Oxygen Delivery Method: Circle system utilized Preoxygenation: Pre-oxygenation with 100% oxygen Induction Type: IV induction Ventilation: Mask ventilation without difficulty Laryngoscope Size: Mac and 3 Grade View: Grade II Tube type: Oral Tube size: 7.5 mm Number of attempts: 1 Airway Equipment and Method: Stylet and Oral airway Placement Confirmation: ETT inserted through vocal cords under direct vision, positive ETCO2 and breath sounds checked- equal and bilateral Secured at: 21 cm Tube secured with: Tape Dental Injury: Teeth and Oropharynx as per pre-operative assessment

## 2022-10-04 NOTE — Discharge Summary (Cosign Needed)
[facility] [place] [facility] VALVE TEAM  Discharge Summary    Patient ID: Melanie Ray MRN: [id number]; DOB: [birth year]  Admit date: 10/04/2022 Discharge date: 10/05/2022  Primary Care Provider: Leone Haven, MD  Primary Cardiologist: Sherren Mocha, MD   Discharge Diagnoses    Principal Problem:   S/P mitral valve clip implantation Active Problems:   Essential hypertension   HLD (hyperlipidemia)   DM type 2 (diabetes mellitus, type 2) ([place])   CAD (coronary artery disease)   Ischemic cardiomyopathy   Left bundle branch block   ICD (implantable cardioverter-defibrillator) in place   Severe mitral regurgitation   Acute on chronic combined systolic and diastolic CHF (congestive heart failure) (HCC)  Allergies Allergies  Allergen Reactions   Tetracycline Swelling   Meperidine Nausea And Vomiting    (Demerol)Nausea   Entresto [Sacubitril-Valsartan] Other (See Comments)    Shortness of Breath   Diagnostic Studies/Procedures    Transcatheter edge to edge repair (TEER) 10/04/22:  CONCLUSION: Successful transcatheter edge-to-edge mitral valve repair under fluoroscopic and echo guidance, reducing baseline 4+ mitral regurgitation to trace, clips positioned A2/P2.  _____________  Echo 10/05/22: Completed but pending formal read at the time of discharge   History of Present Illness     Melanie Ray is a 71 y.o. female with a history of  CAD s/p Anterior STEMI 5/17 with PCI/DES x 2 to LAD, HFrEF/ischemic cardiomyopathy with LVEF at 20-25%, DM2, HTN, HLD, LBBB with CRT-D placement 01/02/22, OSA, and severe mitral regurgitation who was referred to Dr. Burt Knack for TEER scheduled for 10/04/22.    [name] is followed by Dr. Haroldine Laws for her cardiology care. She has a long hx of ischemic cardiomyopathy not responsive to CRT therapy. She underwent an echocardiogram 07/17/2022 showing an LVEF at 20 to 25%, normal RV function, and severe  central mitral regurgitation. Subsequent LHC and TEE demonstrated continued patency of her LAD stents with residual nonobstructive disease. She was noted to have elevated diastolic filling pressures with prominent V waves of 37 mmHg consistent with hemodynamically significant mitral regurgitation. TEE demonstrated severe LV dysfunction with an LVEF of 20 to 25%, normal RV size with moderately reduced RV function, severe left atrial enlargement, and moderately severe mitral regurgitation with annular dilatation and restriction of the posterior leaflet. The mitral regurgitation is graded at 3+.   She was referred to the Structural Heart team and was seen by Dr. Burt Knack 08/08/22 with symptoms of persistent SOB, NYHA Class II symptoms. Plan was to proceed with TEER scheduled for 10/04/22. Pre-op labs showed significantly elevated BNP however appeared euvolemic on pre procedure exam.   Additionally she was evaluated by the multidisciplinary valve team and felt to have severe, symptomatic mitral regurgitation and to be a suitable candidate.   Hospital Course     Severe mitral regurgitation: s/p successful transcatheter edge-to-edge mitral valve repair under fluoroscopic and echo guidance, reducing baseline 4+ mitral regurgitation to trace, clips positioned A2/P2. Post op echocardiogram performed with pending formal review. Noted to have residual murmur on exam. Discussed with Dr. Burt Knack. Post procedure instructions reviewed. She has ambulated with CR without difficulty. Will require lifelong dental SBE which will be RX'ed at follow up next week. Restarted on Plavix this AM.   Acute on chronic HF: Pre op labs showed elevated BNP at 13/775 without overt CHF symptoms on pre-op exam. She was treated with post procedure IV Lasix '40mg'$  with great OP overnight. Net negative 971m. Will give one additional dose  prior to discharge then resume home regimen. Cr stable today.    CAD: s/p anterior STEMI 5/17 with PCI/DES x 2  to LAD. TEER workup with LHC 08/02/22 with patent stents and otherwise non-obstructive disease. Denies chest pain. Plan to continue Plavix, beta blocker, statin.    HFrEF/ischemic cardiomyopathy: LVEF at 20-25% on most recent TEE despite CRT therapy. Continue current regimen with valsartan, carvedilol, Imdur, and Lasix. Appears euvolemic today.    HTN: Moderately elevated with SBPs in the 140 range. Will continue to watch and make adjustments if needed at follow up next week. Continue carvedilol, valsartan.    HLD: Continue Lipitor    Known LBBB: s/p CRT-D placement 01/02/22 with most recent TEE 08/02/22 with LVEF at 20-25%.    Consultants: None    The patient was seen and examined by Dr. Burt Knack who feels that she is stable and ready for discharge today, 10/05/22.  _____________  Discharge Vitals Blood pressure (!) 126/59, pulse 82, temperature 98.8 F (37.1 C), temperature source Oral, resp. rate 18, height '5\' 3"'$  (1.6 m), weight 59 kg, SpO2 99 %.  Filed Weights   10/04/22 1110  Weight: 59 kg   General: Well developed, well nourished, NAD Lungs:Clear to ausculation bilaterally. Breathing is unlabored. Cardiovascular: RRR with S1 S2. + residual MR murmur Abdomen: Soft, non-tender, non-distended. No obvious abdominal masses. Extremities: No edema.  Neuro: Alert and oriented. No focal deficits. No facial asymmetry. MAE spontaneously. Psych: Responds to questions appropriately with normal affect.    Labs & Radiologic Studies    CBC Recent Labs    10/04/22 2056 10/05/22 0024  WBC 7.2 7.7  HGB 12.4 12.2  HCT 37.6 38.1  MCV 83.6 85.0  PLT 240 481   Basic Metabolic Panel Recent Labs    10/04/22 2056 10/05/22 0024  NA  --  139  K  --  3.5  CL  --  109  CO2  --  21*  GLUCOSE  --  134*  BUN  --  25*  CREATININE 1.14* 1.13*  CALCIUM  --  9.3   Liver Function Tests No results for input(s): "AST", "ALT", "ALKPHOS", "BILITOT", "PROT", "ALBUMIN" in the last 72 hours. No results  for input(s): "LIPASE", "AMYLASE" in the last 72 hours. Cardiac Enzymes No results for input(s): "CKTOTAL", "CKMB", "CKMBINDEX", "TROPONINI" in the last 72 hours. BNP Invalid input(s): "POCBNP" D-Dimer No results for input(s): "DDIMER" in the last 72 hours. Hemoglobin A1C No results for input(s): "HGBA1C" in the last 72 hours. Fasting Lipid Panel No results for input(s): "CHOL", "HDL", "LDLCALC", "TRIG", "CHOLHDL", "LDLDIRECT" in the last 72 hours. Thyroid Function Tests No results for input(s): "TSH", "T4TOTAL", "T3FREE", "THYROIDAB" in the last 72 hours.  Invalid input(s): "FREET3" _____________  DG Chest 2 View  Result Date: 10/05/2022 CLINICAL DATA:  Severe mitral insufficiency.  Pre-op clearance exam EXAM: CHEST - 2 VIEW COMPARISON:  05/07/2022 FINDINGS: Mild cardiomegaly again noted. Pacemaker remains in appropriate position. Aortic atherosclerotic calcification incidentally noted. Both lungs are clear. No evidence of pleural effusion. IMPRESSION: Mild cardiomegaly. No active lung disease. Electronically Signed   By: Marlaine Hind M.D.   On: 10/05/2022 08:27   CARDIAC CATHETERIZATION  Result Date: 10/04/2022 Successful TEER of the mitral valve with a MitraClip XTW and Mitraclip NT device, reducing MR from 4+ baseline to trace post-procedure. See full Op Note in Epic for details   ECHO TEE  Result Date: 10/04/2022    TRANSESOPHOGEAL ECHO REPORT   Patient Name:   TAKAYA  ANN Hanks Date of Exam: 10/04/2022 Medical Rec #:  [id number]             Height:       63.0 in Accession #:    [id number]            Weight:       130.0 lb Date of Birth:  05/30/1951             BSA:          1.610 m Patient Age:    56 years              BP:           185/90 mmHg Patient Gender: F                     HR:           100 bpm. Exam Location:  Inpatient Procedure: Transesophageal Echo, 3D Echo, Color Doppler and Cardiac Doppler Indications:     Mitral Regurgitation i34.0  History:         Patient has  prior history of Echocardiogram examinations, most                  recent 08/02/2022. CHF, CAD, Defibrillator; Risk                  Factors:Hypertension, Diabetes and Dyslipidemia.  Sonographer:     Raquel Sarna Senior RDCS Referring Phys:  [place] Diagnosing Phys: Rudean Haskell MD  Sonographer Comments: MitraClip Procedure, 1 NT and 1 NTW Implanted PROCEDURE: After discussion of the risks and benefits of a TEE, an informed consent was obtained from the patient. The transesophogeal probe was passed without difficulty through the esophogus of the patient. Sedation performed by different physician. The patient was monitored while under deep sedation. The patient developed no complications during the procedure.  IMPRESSIONS  1. Prior to procedure, severe mitral regurgitation, mixed functional. Right sided pulmoanry vein flow versal. MVA 4.92 cm2. Small posterior leaflet length. Rotation of the cardiac silhouette.  2. Two Mitra Clips were placed a NTW was medially placed with residual lateral mild to moderate regurgitation. Mean gradient of 5 mm Hg. A second NT clip was placed laterally. There was adhesion to the anterior mtiral valve apparatus, to posterior positioning was optimized and a second clip was deployed. Trace residual MR. Mean gradient of 7.5 mm Hg.  3. A small pericardial effusion is present. The pericardial effusion is posterior to the left ventricle. There is no evidence of cardiac tamponade. No change before or after procedure.  4. Tricuspid valve regurgitation is mild to moderate.  5. Evidence of atrial level shunting detected by color flow Doppler. This is post transeptal puncture and all left to right shunting.  6. Left ventricular ejection fraction, by estimation, is 20 to 25%. The left ventricle has severely decreased function. The left ventricular internal cavity size was severely dilated.  7. Right ventricular systolic function is normal. The right ventricular size is normal.  8. Left  atrial size was severely dilated. No left atrial/left atrial appendage thrombus was detected.  9. Right atrial size was mild to moderately dilated. 10. The aortic valve is tricuspid. Aortic valve regurgitation is not visualized. FINDINGS  Left Ventricle: Left ventricular ejection fraction, by estimation, is 20 to 25%. The left ventricle has severely decreased function. The left ventricular internal cavity size was severely dilated. Right Ventricle: The right ventricular size is normal. No increase in  right ventricular wall thickness. Right ventricular systolic function is normal. Left Atrium: Left atrial size was severely dilated. No left atrial/left atrial appendage thrombus was detected. Right Atrium: Right atrial size was mild to moderately dilated. Pericardium: A small pericardial effusion is present. The pericardial effusion is posterior to the left ventricle. There is no evidence of cardiac tamponade. Mitral Valve: Prior to procedure, severe mitral regurgitation, mixed functional. Right sided pulmoanry vein flow versal. MVA 4.92 cm2. Small posterior leaflet length. Rotation of the cardiac silhouette. The mitral valve has been repaired/replaced. Severe  mitral valve regurgitation. MV peak gradient, 9.9 mmHg. The mean mitral valve gradient is 6.2 mmHg. Tricuspid Valve: The tricuspid valve is normal in structure. Tricuspid valve regurgitation is mild to moderate. No evidence of tricuspid stenosis. Aortic Valve: The aortic valve is tricuspid. Aortic valve regurgitation is not visualized. Pulmonic Valve: The pulmonic valve was grossly normal. Pulmonic valve regurgitation is trivial. No evidence of pulmonic stenosis. Aorta: The aortic root, ascending aorta, aortic arch and descending aorta are all structurally normal, with no evidence of dilitation or obstruction. IAS/Shunts: Evidence of atrial level shunting detected by color flow Doppler. Additional Comments: A device lead is visualized in the right ventricle and  right atrium. Spectral Doppler performed. MITRAL VALVE MV Peak grad: 9.9 mmHg MV Mean grad: 6.2 mmHg MV Vmax:      1.58 m/s MV Vmean:     119.0 cm/s MR Peak grad:    79.6 mmHg MR Mean grad:    41.0 mmHg MR Vmax:         446.00 cm/s MR Vmean:        294.0 cm/s MR PISA:         1.57 cm MR PISA Eff ROA: 14 mm MR PISA Radius:  0.50 cm Rudean Haskell MD Electronically signed by Rudean Haskell MD Signature Date/Time: 10/04/2022/3:44:45 PM    Final    Disposition   Pt is being discharged home today in good condition.  Follow-up Plans & Appointments    Follow-up Information     Tommie Raymond, NP Follow up on 10/10/2022.   Specialty: Cardiology Why: @ 8:45am. Please arrive at 8:30am. Contact information: [address] [place] [address] [phone]                Discharge Instructions     Call MD for:  difficulty breathing, headache or visual disturbances   Complete by: As directed    Call MD for:  extreme fatigue   Complete by: As directed    Call MD for:  hives   Complete by: As directed    Call MD for:  persistant dizziness or light-headedness   Complete by: As directed    Call MD for:  persistant nausea and vomiting   Complete by: As directed    Call MD for:  redness, tenderness, or signs of infection (pain, swelling, redness, odor or green/yellow discharge around incision site)   Complete by: As directed    Call MD for:  severe uncontrolled pain   Complete by: As directed    Call MD for:  temperature >100.4   Complete by: As directed    Diet - low sodium heart healthy   Complete by: As directed    Discharge instructions   Complete by: As directed    Home Care Following Your MitraClip Procedure     ACTIVITY AND EXERCISE  Daily activity and exercise are an important part of your recovery. People recover at  different rates depending on their general health and type of valve procedure. One of the best ways to get stronger after your procedure  is to walk. If your health care provider team agrees, start with short walks at home. Walk a little more each day. Take someone with you to make sure you are safe until you feel OK to walk alone.  Most people recovering feel better relatively quickly.  Avoid strenuous activity or exercise for at least 1 week or as instructed by your physician. No lifting, pushing, pulling more than 10 pounds (examples to avoid: groceries, vacuuming, gardening, golfing)  Take care not to put strain on your abdominal muscles when coughing, sneezing or moving your bowels. You may resume sexual activity within 7 to 10 days, unless your provider instructs you differently.  NOTE: You will typically see one of our providers 7-10 days after your procedure to discuss [place] the above activities.    DRIVING Do not drive for until you are seen for follow up and cleared by a provider. If you have been told by your doctor in the past that you may not drive, you must talk with him/her before you begin driving again.  CARE FOR YOUR GROIN INCISION It is normal for your incision to be bruised, itchy or sore while it is healing. It may take up to a week or two to heal. Care for your bandage as instructed by your physician team. Wash the incision site everyday with warm water and soap. Gently pat it dry and do not put powder, lotion or ointment on the incision until it is healed.  Check your groin site every day for signs of infection. Check for: Redness, swelling, warmth or pain Fluid or blood draining at incision site A new lump or bump  HYGIENE Unless your physician tells you otherwise, you may take a shower when you return home. After the shower, pat the site dry. Do NOT use powder, oils or lotions in your groin area until the site has completely healed. Do not take a bath until your incision is completely healed.  Do not submerge your incision in a swimming pool, lake or hot tub until completely healed. Wear loose  fitting clothes over the incision site until it is healed.  If you notice any fevers, chills, increased pain, swelling, bleeding or pus, please contact your provider.   ADDITIONAL INFORMATION Keep all follow up visits as told by your provider. This is important.  Weigh yourself the same time each day wearing similar clothing. Carefully follow your providers instructions regarding medications you need to take, especially if blood-thinning medication(s) are prescribed. Call your provider if you cannot keep taking your mediations because of side effects, such as rash, bleeding or upset stomach. Ask your provider if you need to take antibiotic medicine before medical or dental procedures to prevent infection on your heart valve. You will receive an Implant Identification Card, which your provider will fill out and you must carry with you at all times.  Show your Implant Identification Card if you report to an emergency room.  This card identifies you as a patient who has had a MitraClip NT device implanted.  If you require a magnetic resonance imaging (MRI) scan, tell your provider or MRI technician that you have a MitraClip NT device implanted.  Test results indicate that patients with the MitraClip NT device can safely undergo MRI scans under certain conditions described on the card.    WHEN TO [place]  ATTENTION  Contact your provider if: You have a fever You have a weight gain of more than 2 pounds in 24 hours or more than 5 pounds in a week You have extreme fatigue You have pain that does not get better with medicine You have fluid or blood coming from your catheter insertion site You have any signs or symptom of groin infection  Seek immediate medical help by calling 911 or go to your nearest Emergency Department if:  You have chest pain or trouble breathing. You have sudden numbness or weakness in your face, arms or legs. You have shortness of breath that doesn't get  better with rest. You have a bowel movement that is dark black or bright red. You have increased swelling in your hand, feet, or ankles. You have a heart rate faster than 120 beats per minute with shortness of breath.  Heart rate lower than 50 beats per minute or a new irregular heart rate.   Increase activity slowly   Complete by: As directed       Discharge Medications   Allergies as of 10/05/2022       Reactions   Tetracycline Swelling   Meperidine Nausea And Vomiting   (Demerol)Nausea   Entresto [sacubitril-valsartan] Other (See Comments)   Shortness of Breath        Medication List     TAKE these medications    atorvastatin 80 MG tablet Commonly known as: LIPITOR Take 1 tablet by mouth once daily   azelastine 0.1 % nasal spray Commonly known as: ASTELIN Place 2 sprays into both nostrils 2 (two) times daily. Use in each nostril as directed   carvedilol 12.5 MG tablet Commonly known as: COREG TAKE 1 TABLET BY MOUTH TWICE DAILY WITH MEALS   clopidogrel 75 MG tablet Commonly known as: PLAVIX Take 1 tablet by mouth once daily   dorzolamide-timolol 2-0.5 % ophthalmic solution Commonly known as: COSOPT Place 1 drop into both eyes 2 (two) times daily.   furosemide 20 MG tablet Commonly known as: LASIX Take 20 mg by mouth every other day.   isosorbide mononitrate 30 MG 24 hr tablet Commonly known as: IMDUR Take 1 tablet by mouth once daily   latanoprost 0.005 % ophthalmic solution Commonly known as: XALATAN Place 1 drop into both eyes at bedtime.   lumateperone tosylate 42 MG capsule Commonly known as: CAPLYTA Take 42 mg by mouth at bedtime.   nitroGLYCERIN 0.4 MG SL tablet Commonly known as: NITROSTAT DISSOLVE ONE TABLET UNDER THE TONGUE EVERY FIVE MINUTES AS NEEDED FOR CHEST PAIN. DO NOT EXCEED A TOTAL OF THREE DOSES IN 15 MINUTES   traZODone 150 MG tablet Commonly known as: DESYREL Take 150 mg by mouth at bedtime.   valsartan 80 MG  tablet Commonly known as: DIOVAN Take 80 mg by mouth daily.   venlafaxine XR 150 MG 24 hr capsule Commonly known as: EFFEXOR-XR TAKE 2 CAPSULES BY MOUTH ONCE DAILY (NEEDS to follow-up with psychiatry for further refills)        Outstanding Labs/Studies   None   Duration of Discharge Encounter   Greater than 30 minutes including physician time.  SignedKathyrn Drown, NP 10/05/2022, 9:57 AM [phone]   Patient seen, examined. Available data reviewed. Agree with findings, assessment, and plan as outlined by Kathyrn Drown, NP. Te patient is independently interviewed and examined. She feels well. No chest pain or shortness of breath. On exam, she is alert, oriented, in NAD. JVP normal. Lungs CTA. Heart RRR  with 2/6 holosystolic murmur at the apex. Abdomen soft, NT. Right groin site clear. No edema. Echo reviewed and shows 3+ residual MR. Agree with echo interpretation. While both clips appear stable, suspect there has been a SLDA with loss of the posterior leaflet of the second clip. Unfortunately I don't think there are any other interventional options available short of surgery. Favor continued medical therapy of CHF, close follow-up in advanced HF Clinic.   Sherren Mocha, M.D. 10/06/2022 8:28 AM

## 2022-10-04 NOTE — Progress Notes (Addendum)
LOCATION: left RADIAL a-line removed  DRESSING APPLIED: gauze with tegaderm  SITE UPON ARRIVAL: LEVEL 0  SITE AFTER BAND REMOVAL: LEVEL 0  CIRCULATION SENSATION AND MOVEMENT: +2 radial pulse noted, + movement noted to left hand  COMMENTS: slight puffiness noted to left wrist area, but remains soft

## 2022-10-04 NOTE — Anesthesia Postprocedure Evaluation (Signed)
Anesthesia Post Note  Patient: Melanie Ray  Procedure(s) Performed: MITRAL VALVE REPAIR TRANSESOPHAGEAL ECHOCARDIOGRAM (TEE)     Patient location during evaluation: Cath Lab Anesthesia Type: General Level of consciousness: awake and alert Pain management: pain level controlled Vital Signs Assessment: post-procedure vital signs reviewed and stable Respiratory status: spontaneous breathing, nonlabored ventilation, respiratory function stable and patient connected to nasal cannula oxygen Cardiovascular status: blood pressure returned to baseline and stable Postop Assessment: no apparent nausea or vomiting Anesthetic complications: no   There were no known notable events for this encounter.  Last Vitals:  Vitals:   10/04/22 1700 10/04/22 1935  BP:  (!) 158/73  Pulse: 87 87  Resp: 19 15  Temp: 36.9 C 37.1 C  SpO2:  94%    Last Pain:  Vitals:   10/04/22 1935  TempSrc: Oral  PainSc: 0-No pain                 Santa Lighter

## 2022-10-04 NOTE — Progress Notes (Addendum)
This RN and Chilton Si, RN called back to pt's bedside (2C16) for an oozing right groin, drsg removed and manual pressure held for approximately 10 minutes by Chilton Si, RN, new gauze with tegaderm placed, bedrest to restart, purewick remains in place, Ana, RN to bedside to evaluate pt right groin, Dr. Burt Knack notified of pt situation by Wilhemena Durie, RN, states he will come to see pt, safety maintained, 2C bedside RN informed to hold pressure if any continued oozing noted, main cath lab number given to [facility] RN at her request

## 2022-10-04 NOTE — Progress Notes (Signed)
  [place] VALVE TEAM  Patient doing well s/p TEER. She is hemodynamically stable. Groin sites stable.  Plan to DC arterial line and transfer to 4E.  Plan for early ambulation after bedrest completed and hopeful discharge over the next 24-48 hours.   Kathyrn Drown NP-C Structural Heart Team  Pager: [phone] Phone: [phone]

## 2022-10-04 NOTE — Progress Notes (Signed)
[facility] DEVICE PROGRAMMING  Patient Information: Name:  Melanie Ray  DOB:  1951/10/12  MRN:  [id number]    Planned Procedure:  Mitral Valve Repair/ TEE  Surgeon:  Dr. Sherren Mocha  Date of Procedure:  10/04/22  Cautery will be used.  Position during surgery:  Supine   Please send documentation back to:  [facility] (Fax # [phone])  Device Information:  Clinic EP Physician:  Lars Mage   Device Type:  Defibrillator Manufacturer and Phone #:  [employer]: [phone] Pacemaker Dependent?:  No. Date of Last Device Check:  07/17/2022 Normal Device Function?:  Yes.    Electrophysiologist's Recommendations:  Have magnet available. Provide continuous ECG monitoring when magnet is used or reprogramming is to be performed.  Procedure will likely interfere with device function.  Device should be programmed:  Tachy therapies disabled  Per Device Clinic Standing Orders, [name], RN  10:45 AM 10/04/2022

## 2022-10-04 NOTE — Progress Notes (Signed)
Patient arrived to floor at 1656 post mitral valve clip placement. Slight drainage noted on gauze on arrival to room and outlined.   Shortly after cath lab personnel left room, groin site began oozing slightly more. Cath lab personnel returned to room, removed gauze, applied pressure, got the bleeding to stop and applied new bandage.   Patient's will start over at 1720 for four hours. Patient will lie flat in bed until 1620, if no oozing at that time.

## 2022-10-04 NOTE — Progress Notes (Signed)
Upon assessing the patient's Right femoral access site, the dressing was saturated with blood. Dressing was removed to assess the site. No active bleeding from the site however some hardness felt near the groin site expanding towards the pubis. Dr. Foye Clock paged and made aware. Vitals WNL. MD at bedside to assess the patient. RN to inform the MD if the hematoma grows further. Continuing the care plan.

## 2022-10-04 NOTE — Op Note (Signed)
PROCEDURE:  Transcatheter edge to edge mitral valve repair (TEER) INDICATION: Severe symptomatic mitral regurgitation (Stage D)  SURGEON:  Sherren Mocha, MD CO-SURGEON: Lenna Sciara, MD  PROCEDURAL DETAILS: General anesthesia is induced.  The patient is prepped and draped.  Baseline transesophageal echo images are obtained and confirm appropriate anatomy for transcatheter edge-to-edge mitral valve repair.  Using vascular ultrasound guidance, the right common femoral vein is accessed via a front wall puncture.  2 Perclose sutures are deployed and an 8 [place] sheath is inserted.  A versa cross wire is advanced into the SVC.  Heparin is administered and a therapeutic ACT is achieved.  Transseptal puncture is performed over the mid posterior portion of the fossa.  The septum is dilated while the MitraClip steerable guide catheter is prepped.  After progressively dilating the right common femoral vein, the 24 French MitraClip steerable guide catheter is inserted and advanced across the interatrial septum.  The dilator and the versa cross wire were carefully removed and the guide tip is appropriately positioned approximately 3 cm across the interatrial septum.  A MitraClip NTW device is prepped per protocol.  The device is inserted through the steerable guide catheter with caution taken to avoid air entrapment.  The MitraClip device is positioned above the mitral valve after applying appropriate curve to the guide catheter.  The MitraClip device is then oriented coaxially with the anterior and posterior leaflets of the mitral valve.  Low tidal volume ventilation is initiated.  The clip device is advanced across the mitral valve and pulled back until capture of both the anterior and posterior leaflets is achieved.  Grippers are dropped and the clip is closed under TEE guidance.  Careful TEE assessment is performed and reduction in mitral valve regurgitation is felt to be appropriate.  Leaflet insertion is  verified using 3D imaging.  The MitraClip device is deployed using normal technique and the clip delivery system is removed.  After full TEE assessment, the procedural result is felt to be adequate with reduction of mitral regurgitation to 2+.   Second clip A MitraClip NT device is prepped per protocol.  The device is inserted through the steerable guide catheter with caution taken to avoid air entrapment.  The MitraClip device is positioned above the mitral valve after applying appropriate curve to the guide catheter.  The MitraClip device is then oriented coaxially with the anterior and posterior leaflets of the mitral valve.  Low tidal volume ventilation is initiated.  The clip device is advanced across the mitral valve and pulled back until capture of both the anterior and posterior leaflets is achieved.  Grippers are dropped and the clip is closed under TEE guidance.  Careful TEE assessment is performed and reduction in mitral valve regurgitation is felt to be appropriate.  Leaflet insertion is verified using 3D imaging.  The MitraClip device is deployed using normal technique and the clip delivery system is removed.  After full TEE assessment, the procedural result is felt to be adequate with reduction of mitral regurgitation to trace.   PROCEDURE COMPLETION: The steerable guide catheter is pulled back into the right atrium and the interatrial septum is assessed with TEE.  There is no significant septal injury seen and no right to left shunting.  The guide catheter is removed and the Perclose sutures are tightened.    CONCLUSION: Successful transcatheter edge-to-edge mitral valve repair under fluoroscopic and echo guidance, reducing baseline 4+ mitral regurgitation to trace, clips positioned A2/P2.  Early Osmond 10/04/2022  3:26 PM

## 2022-10-04 NOTE — Anesthesia Procedure Notes (Signed)
Arterial Line Insertion Performed by: Valda Favia, CRNA, CRNA  Patient location: Pre-op. Preanesthetic checklist: patient identified, IV checked, site marked, risks and benefits discussed, surgical consent, monitors and equipment checked, pre-op evaluation, timeout performed and anesthesia consent Lidocaine 1% used for infiltration and patient sedated Left, radial was placed Catheter size: 20 G Hand hygiene performed , maximum sterile barriers used  and Seldinger technique used Allen's test indicative of satisfactory collateral circulation Attempts: 2 Procedure performed without using ultrasound guided technique. Following insertion, dressing applied and Biopatch. Patient tolerated the procedure well with no immediate complications.

## 2022-10-04 NOTE — Progress Notes (Signed)
Pure wick placed, peri care given, tolerated well, safety maintained 

## 2022-10-04 NOTE — Progress Notes (Signed)
Entered patient room to check groin site. Patient has been lying flat and still since cath lab personnel left room about 1 hour and 10 mins ago. One speck of red noted on dressing. Outlined.   Recheck 3 to 4 minutes later, the bloody area has grown to about 1 x 1 inch area. Cath lab personnel called. At bedside.  Continue to monitor.

## 2022-10-04 NOTE — Op Note (Signed)
[facility]   MULTIDISCIPLINARY HEART TEAM  Date of Procedure:  10/04/2022  Preoperative Diagnosis: Severe Symptomatic Mitral Regurgitation (Stage D)  Postoperative Diagnosis: Same   Procedure Performed: Ultrasound-guided right transfemoral venous access Double PreClose right femoral vein Transseptal puncture using Bailess RF needle Mitral valve repair with MitraClip XTW and MitraClip NT  Surgeon: Blane Ohara, MD  Co-Surgeon: Lenna Sciara, MD  Echocardiographer: Rudean Haskell, MD  Anesthesiologist: Hoy Morn, MD  Device Implant: Mitraclip NTW x 1, MitraClip NT x 1  Procedural Indication: Severe Non-rheumatic Mitral Regurgitation (Stage D)   Brief History: The patient is a 71 year old woman with history of severe ischemic heart disease after initially presented with an anterior STEMI in 2017.  LVEF is 20 to 25% with comorbidities of type 2 diabetes, left bundle branch block status post CRT-D, hypertension, and mixed hyperlipidemia.  She has developed severe, symptomatic mitral regurgitation with NYHA functional class III symptoms and signs of acute on chronic systolic heart failure with markedly elevated BNP on her preoperative testing.  She is referred for transcatheter edge-to-edge repair of the mitral valve with MitraClip after multidisciplinary heart team review of her case and management by the advanced heart failure service.  Echo Findings: Preop:   Severe MR secondary to atrial dilatation, severe LV dysfunction, Grade 4+ Severe global LV systolic dysfunction LVEF less than 25% Post-op:  Unchanged LV systolic function Trace residual MR  Procedural Details: Prep The patient is brought to the cardiac catheterization lab in the fasting state. General anesthesia is induced. The patient is prepped from the groin to chin. Hemodynamics are monitored via a radial artery line.   Venous Access Using ultrasound guidance, the right femoral vein is  punctured. Ultrasound images are captured and stored in the patient's chart. The vein is dilated and 2 Perclose devices are deplyed at 10' and 2' positions to 'Preclose' the femoral vein. An 8 Fr sheath is inserted.  Transseptal Puncture A [place] Versacross wire is advanced into the SVC A [place]lis transseptal dilator is advanced into the SVC, and the VersaCross RF wire is retracted into the dilator  The transseptal sheath is retracted into the RA under fluoroscopic and echo guidance to obtain position on the posterior fossa where echo measurements are made to assure appropriate access to the mitral valve. Once proper position is confirmed by echo, RF energy is delivered and the VersaCross wire is advanced into the LA without resistance. The dilator and sheath are advanced over the wire where proper position is confirmed by echo and pressure measurement Weight based IV heparin is administered and a therapeutic ACT > 250 is confirmed  Steerable Guide Catheter Insertion The VersaCross wire is positioned at the left upper pulmonary vein The femoral vein is progressively dilated and the 24 Fr Steerable guide catheter is inserted and then directed across the interatrial septum over the wire. Position is confirmed approximately 3 cm into the left atrium The guide is de-aired   MitraClip Insertion The MitraClip NTW is prepped per protocol and inserted via the introducer into the steerable guide catheter The Clip Delivery System (CDS) is advanced under fluoro and echo guidance so that the sleeve markers are evenly spaced on each side of the guide marker  MitraClip Positioning in the Left Atrium (Supravalvular Alignment) M-knob is applied to bring the Clip towards the mitral valve. Echo guidance is used to avoid contact with LA structures. The Clip arms are opened to 180 degrees 2D and 3D TEE imaging is performed in  multiple planes and the Clip is positioned and aligned above the valve using standard  steering techniques   Entry into the Left Ventricle and Mitral Valve Leaflet Grasp The Clip is advanced across the mitral valve into the LV, maintaining proper orientation The Clip arms are opened to 120 degrees and the Clip is slowly retracted  Capture of both the anterior and posterior leaflets are visualized by echo and the grippers are dropped  MitraClip Deployment After extensive echo evaluation, reduction in mitral regurgitation is felt to be adequate there is significant mitral regurgitation localized to the lateral aspect of the first clip.  We decided to approach this with a 2 clip strategy.  The mean transmitral gradient after the first clip is 5 mmHg. Following standard protocol, the lock line is removed after testing the lock mechanism. The lock is rechecked and is shown to be intact. The MitraClip device is deployed and the clip delivery system is removed under echo guidance with caution taken to avoid contact with LA structures  Placement of Clip #2 MitraClip Insertion The MitraClip NT is prepped per protocol and inserted via the introducer into the steerable guide catheter The Clip Delivery System (CDS) is advanced under fluoro and echo guidance so that the sleeve markers are evenly spaced on each side of the guide marker  MitraClip Positioning in the Left Atrium (Supravalvular Alignment) M-knob is applied to bring the Clip towards the mitral valve. Echo guidance is used to avoid contact with LA structures. Low tidal volume respiration is initiated The Clip arms are opened to 180 degrees 2D and 3D TEE imaging is performed in multiple planes and the Clip is positioned and aligned above the valve using standard steering techniques   Entry into the Left Ventricle and Mitral Valve Leaflet Grasp The Clip is advanced across the mitral valve into the LV, maintaining proper orientation and with caution taken to avoid contact with the first Clip The Clip arms are opened further and the  Clip is slowly retracted.  There is some interaction between the clip and the anterior leaflet but we are able to realign the clip and grasp both the anterior and posterior leaflet without complication. Capture of both the anterior and posterior leaflets are visualized by echo and the grippers are dropped  MitraClip Deployment After extensive echo evaluation, reduction in mitral regurgitation is felt to be adequate Following standard protocol, the MitraClip device is deployed, gripper line and lock line are removed  Device Removal The clip delivery system is removed under echo guidance The steerable guide catheter is retracted into the right atrium and the interatrial septum is assessed by echo without evidence of right-to-left shunting or large ASD  Hemostasis The guide catheter is removed over a 0.035" wire and the Perclose sutures are tightened with complete hemostasis and no evidence of hematoma  Estimated blood loss: minimal  There are no immediate procedural complications. The patient is transferred to the post-procedure recovery area in stable condition.   Final conclusion: Successful transcatheter edge-to-edge repair of the mitral valve using 2 MitraClip devices (NTW x1 and NT x1).  Both devices were positioned A2/P2 with the initial device placed central and the second device placed over the lateral aspect of A2/P2.  Reduction in mitral regurgitation is excellent with 4+ at baseline reduced to trace post procedure.  Sherren Mocha 10/04/2022 4:30 PM

## 2022-10-04 NOTE — Transfer of Care (Signed)
Immediate Anesthesia Transfer of Care Note  Patient: Melanie Ray  Procedure(s) Performed: MITRAL VALVE REPAIR TRANSESOPHAGEAL ECHOCARDIOGRAM (TEE)  Patient Location: Cath Lab  Anesthesia Type:General  Level of Consciousness: awake, alert , and oriented  Airway & Oxygen Therapy: Patient Spontanous Breathing and Patient connected to nasal cannula oxygen  Post-op Assessment: Report given to RN and Post -op Vital signs reviewed and stable  Post vital signs: Reviewed and stable  Last Vitals:  Vitals Value Taken Time  BP 136/68 10/04/22 1521  Temp    Pulse 88 10/04/22 1524  Resp 21 10/04/22 1524  SpO2 94 % 10/04/22 1524  Vitals shown include unvalidated device data.  Last Pain:  Vitals:   10/04/22 1110  TempSrc: Oral  PainSc:       Patients Stated Pain Goal: 0 (87/56/43 [id number])  Complications: There were no known notable events for this encounter.

## 2022-10-04 NOTE — Progress Notes (Signed)
Paged back to patient room due to patient "slight oozing from site". Dressing was saturated, upon removal, site began bleeding heavily and pressure was placed immediately. 6ccs of lido with epi given by Vicente Males, RN. Pressure held for 30 minutes. At this time, site is CDI. Dressing place. No further concerns at this time.

## 2022-10-04 NOTE — Anesthesia Preprocedure Evaluation (Addendum)
Anesthesia Evaluation  Patient identified by MRN, date of birth, ID band Patient awake    Reviewed: Allergy & Precautions, NPO status , Patient's Chart, lab work & pertinent test results, reviewed documented beta blocker date and time   Airway Mallampati: II  TM Distance: >3 FB Neck ROM: Full    Dental  (+) Teeth Intact, Dental Advisory Given   Pulmonary sleep apnea , former smoker   Pulmonary exam normal breath sounds clear to auscultation       Cardiovascular hypertension, Pt. on home beta blockers and Pt. on medications + CAD, + Past MI, + Cardiac Stents and +CHF  + dysrhythmias (LBBB) + Cardiac Defibrillator + Valvular Problems/Murmurs MR  Rhythm:Regular Rate:Normal + Systolic murmurs Echo 6/43/32: 1. Left ventricular ejection fraction, by estimation, is 20 to 25%. The  left ventricle has severely decreased function. The left ventricular  internal cavity size was moderately dilated.   2. Right ventricular systolic function is moderately reduced. The right  ventricular size is normal.   3. Left atrial size was severely dilated. No left atrial/left atrial  appendage thrombus was detected.   4. Right atrial size was mildly dilated.   5. MV is decgenative. Mildly thickened. Posterior leaflet restricted. 3+  central MR. The mitral valve is degenerative. Moderate mitral valve  regurgitation.   6. Tricuspid valve regurgitation is mild to moderate.   7. The aortic valve is tricuspid. Aortic valve regurgitation is mild.   8. There is mild (Grade II) plaque.     Neuro/Psych  PSYCHIATRIC DISORDERS Anxiety Depression    CVA, No Residual Symptoms    GI/Hepatic negative GI ROS, Neg liver ROS,,,  Endo/Other  diabetes, Type 2    Renal/GU negative Renal ROS     Musculoskeletal negative musculoskeletal ROS (+)    Abdominal   Peds  Hematology  (+) Blood dyscrasia (Plavix)   Anesthesia Other Findings Day of surgery  medications reviewed with the patient.  Reproductive/Obstetrics                             Anesthesia Physical Anesthesia Plan  ASA: 4  Anesthesia Plan: General   Post-op Pain Management: Tylenol PO (pre-op)*   Induction: Intravenous  PONV Risk Score and Plan: 3 and Dexamethasone and Ondansetron  Airway Management Planned: Oral ETT  Additional Equipment: Arterial line  Intra-op Plan:   Post-operative Plan: Extubation in OR  Informed Consent: I have reviewed the patients History and Physical, chart, labs and discussed the procedure including the risks, benefits and alternatives for the proposed anesthesia with the patient or authorized representative who has indicated his/her understanding and acceptance.     Dental advisory given  Plan Discussed with: CRNA  Anesthesia Plan Comments:        Anesthesia Quick Evaluation

## 2022-10-04 NOTE — Interval H&P Note (Signed)
History and Physical Interval Note:  10/04/2022 12:40 PM  Melanie Ray  has presented today for surgery, with the diagnosis of severe mitral insufficiency.  The various methods of treatment have been discussed with the patient and family. After consideration of risks, benefits and other options for treatment, the patient has consented to  Procedure(s): MITRAL VALVE REPAIR (N/A) TRANSESOPHAGEAL ECHOCARDIOGRAM (TEE) (N/A) as a surgical intervention.  The patient's history has been reviewed, patient examined, no change in status, stable for surgery.  I have reviewed the patient's chart and labs.  Questions were answered to the patient's satisfaction.     Sherren Mocha

## 2022-10-05 ENCOUNTER — Encounter (HOSPITAL_COMMUNITY): Payer: Self-pay | Admitting: Cardiovascular Disease

## 2022-10-05 ENCOUNTER — Inpatient Hospital Stay (HOSPITAL_COMMUNITY): Payer: Medicare Other

## 2022-10-05 ENCOUNTER — Ambulatory Visit (INDEPENDENT_AMBULATORY_CARE_PROVIDER_SITE_OTHER): Payer: Medicare Other

## 2022-10-05 DIAGNOSIS — I5043 Acute on chronic combined systolic (congestive) and diastolic (congestive) heart failure: Secondary | ICD-10-CM | POA: Insufficient documentation

## 2022-10-05 DIAGNOSIS — Z006 Encounter for examination for normal comparison and control in clinical research program: Secondary | ICD-10-CM | POA: Diagnosis not present

## 2022-10-05 DIAGNOSIS — I255 Ischemic cardiomyopathy: Secondary | ICD-10-CM

## 2022-10-05 DIAGNOSIS — I340 Nonrheumatic mitral (valve) insufficiency: Secondary | ICD-10-CM | POA: Diagnosis not present

## 2022-10-05 DIAGNOSIS — Z954 Presence of other heart-valve replacement: Secondary | ICD-10-CM | POA: Diagnosis not present

## 2022-10-05 DIAGNOSIS — Z20822 Contact with and (suspected) exposure to covid-19: Secondary | ICD-10-CM | POA: Diagnosis not present

## 2022-10-05 LAB — ECHOCARDIOGRAM COMPLETE
AR max vel: 1.69 cm2
AV Area VTI: 1.49 cm2
AV Area mean vel: 1.59 cm2
AV Mean grad: 7 mmHg
AV Peak grad: 12.8 mmHg
Ao pk vel: 1.79 m/s
Area-P 1/2: 2.2 cm2
Calc EF: 25.5 %
Height: 63 in
MV M vel: 4.75 m/s
MV Peak grad: 90.3 mmHg
MV VTI: 1.08 cm2
P 1/2 time: 342 msec
S' Lateral: 5.3 cm
Single Plane A2C EF: 30 %
Single Plane A4C EF: 21.1 %
Weight: 2080 oz

## 2022-10-05 LAB — CBC
HCT: 38.1 % (ref 36.0–46.0)
Hemoglobin: 12.2 g/dL (ref 12.0–15.0)
MCH: 27.2 pg (ref 26.0–34.0)
MCHC: 32 g/dL (ref 30.0–36.0)
MCV: 85 fL (ref 80.0–100.0)
Platelets: 218 10*3/uL (ref 150–400)
RBC: 4.48 MIL/uL (ref 3.87–5.11)
RDW: 14 % (ref 11.5–15.5)
WBC: 7.7 10*3/uL (ref 4.0–10.5)
nRBC: 0 % (ref 0.0–0.2)

## 2022-10-05 LAB — GLUCOSE, CAPILLARY
Glucose-Capillary: 112 mg/dL — ABNORMAL HIGH (ref 70–99)
Glucose-Capillary: 93 mg/dL (ref 70–99)

## 2022-10-05 LAB — BASIC METABOLIC PANEL
Anion gap: 9 (ref 5–15)
BUN: 25 mg/dL — ABNORMAL HIGH (ref 8–23)
CO2: 21 mmol/L — ABNORMAL LOW (ref 22–32)
Calcium: 9.3 mg/dL (ref 8.9–10.3)
Chloride: 109 mmol/L (ref 98–111)
Creatinine, Ser: 1.13 mg/dL — ABNORMAL HIGH (ref 0.44–1.00)
GFR, Estimated: 52 mL/min — ABNORMAL LOW (ref >60)
Glucose, Bld: 134 mg/dL — ABNORMAL HIGH (ref 70–99)
Potassium: 3.5 mmol/L (ref 3.5–5.1)
Sodium: 139 mmol/L (ref 135–145)

## 2022-10-05 MED ORDER — FUROSEMIDE 10 MG/ML IJ SOLN: 40.0000 mg | Freq: Once | INTRAMUSCULAR | Status: DC

## 2022-10-05 NOTE — Progress Notes (Signed)
  Echocardiogram 2D Echocardiogram has been performed.  Melanie Ray 10/05/2022, 9:31 AM

## 2022-10-05 NOTE — Progress Notes (Signed)
CARDIAC REHAB PHASE I   PRE:  Rate/Rhythm: 77 pacing    BP: sitting 154/71    SaO2: 99 2L, 99 RA  MODE:  Ambulation: 300 ft   POST:  Rate/Rhythm: 89 pacing    BP: sitting 146/67     SaO2: 96 RA  Pt moving well, no c/o pain or SOB. To recliner, VSS. Discussed walking, restrictions, daily wts, low sodium, and CRPII. Pt requests her referral be sent to [place]. To [facility] after walk, left pt there. [place] BS, ACSM-CEP 10/05/2022 10:29 AM

## 2022-10-05 NOTE — TOC Progression Note (Signed)
Transition of Care [facility]) - Progression Note    Patient Details  Name: Melanie Ray MRN: [id number] Date of Birth: 02/23/1951  Transition [facility]) CM/SW Contact  Zenon Mayo, RN Phone Number: 10/05/2022, 8:50 AM  Clinical Narrative:    From home, Mitral valve repair with MitraClip.  TOC following.         Expected Discharge Plan and Services                                                 Social Determinants of Health (SDOH) Interventions    Readmission Risk Interventions     No data to display

## 2022-10-05 NOTE — TOC Progression Note (Addendum)
Transition of Care [facility]) - Progression Note    Patient Details  Name: Melanie Ray MRN: [id number] Date of Birth: 25-Nov-1950  Transition of Care [facility]) CM/SW Contact  Zenon Mayo, RN Phone Number: 10/05/2022, 10:26 AM  Clinical Narrative:    Patient is for dc today, she has transport home, NCM gave her brochure for [employer] to help with resources for her spouse.         Expected Discharge Plan and Services           Expected Discharge Date: 10/05/22                                     Social Determinants of Health (SDOH) Interventions    Readmission Risk Interventions     No data to display

## 2022-10-06 LAB — CUP PACEART REMOTE DEVICE CHECK
Battery Remaining Longevity: 68 mo
Battery Remaining Percentage: 84 %
Battery Voltage: 3.04 V
Brady Statistic AP VP Percent: 94 %
Brady Statistic AP VS Percent: 1 %
Brady Statistic AS VP Percent: 5.7 %
Brady Statistic AS VS Percent: 1 %
Brady Statistic RA Percent Paced: 93 %
Date Time Interrogation Session: 20231201135345
HighPow Impedance: 60 Ohm
HighPow Impedance: 60 Ohm
Implantable Lead Connection Status: 753985
Implantable Lead Connection Status: 753985
Implantable Lead Connection Status: 753985
Implantable Lead Implant Date: 20230228
Implantable Lead Implant Date: 20230228
Implantable Lead Implant Date: 20230228
Implantable Lead Location: 753858
Implantable Lead Location: 753859
Implantable Lead Location: 753860
Implantable Lead Model: 3830
Implantable Pulse Generator Implant Date: 20230228
Lead Channel Impedance Value: 400 Ohm
Lead Channel Impedance Value: 430 Ohm
Lead Channel Impedance Value: 630 Ohm
Lead Channel Pacing Threshold Amplitude: 0.375 V
Lead Channel Pacing Threshold Amplitude: 0.625 V
Lead Channel Pacing Threshold Amplitude: 0.625 V
Lead Channel Pacing Threshold Pulse Width: 0.5 ms
Lead Channel Pacing Threshold Pulse Width: 0.5 ms
Lead Channel Pacing Threshold Pulse Width: 0.5 ms
Lead Channel Sensing Intrinsic Amplitude: 12 mV
Lead Channel Sensing Intrinsic Amplitude: 4.2 mV
Lead Channel Setting Pacing Amplitude: 2 V
Lead Channel Setting Pacing Amplitude: 2 V
Lead Channel Setting Pacing Amplitude: 2 V
Lead Channel Setting Pacing Pulse Width: 0.5 ms
Lead Channel Setting Pacing Pulse Width: 0.5 ms
Lead Channel Setting Sensing Sensitivity: 0.5 mV
Pulse Gen Serial Number: 8904264
Zone Setting Status: 755011

## 2022-10-08 ENCOUNTER — Telehealth: Payer: Self-pay

## 2022-10-08 NOTE — Telephone Encounter (Signed)
  [facility]    Contacted the patient regarding discharge from [facility] on 10/05/2022  The patient understands to follow up with Kathyrn Drown on 10/10/2022  The patient understands discharge instructions? Yes  The patient understands medications and regimen? Yes   The patient reports groin site looks healthy with no S/S of bleeding or infection   Melanie Ray c/o increased DOE and fatigue since this weekend. She blames her diet for her SOB and bloated feeling (she ate pizza and ground beef over the weekend). She will try to eat better and see if symptoms resolve prior to visit the day after tomorrow. She denies CP, hear racing.  She understands to call with any questions or concerns prior to scheduled visit.

## 2022-10-09 NOTE — Progress Notes (Unsigned)
[facility] [place]                                     Cardiology Office Note:    Date:  10/10/2022   ID:  Melanie Ray, DOB [birth year], MRN [id number]  PCP:  Melanie Haven, MD  [facility] Cardiologist:  Melanie Mocha, MD  [facility] Electrophysiologist:  Melanie Epley, MD   Referring MD: Melanie Haven, MD   Chief Complaint  Patient presents with   Follow-up    TOC s/p TEER   History of Present Illness:    Melanie Ray is a 71 y.o. female with a hx of CAD s/p anterior STEMI 5/17 with PCI/DES x 2 to LAD, HFrEF/ischemic cardiomyopathy with LVEF at 20-25%, DM2, HTN, HLD, LBBB with CRT-D placement 01/02/22, OSA, and severe mitral regurgitation who underwent TEER with MitraClip 10/04/22.    Melanie Ray is followed by Dr. Haroldine Ray for her cardiology care. She has a long hx of ischemic cardiomyopathy not responsive to CRT therapy. She underwent an echocardiogram 07/17/2022 showing an LVEF at 20 to 25%, normal RV function, and severe central mitral regurgitation. Subsequent LHC and TEE demonstrated continued patency of her LAD stents with residual nonobstructive disease. She was noted to have elevated diastolic filling pressures with prominent V waves of 37 mmHg consistent with hemodynamically significant mitral regurgitation. TEE demonstrated severe LV dysfunction with an LVEF of 20 to 25%, normal RV size with moderately reduced RV function, severe left atrial enlargement, and moderately severe mitral regurgitation with annular dilatation and restriction of the posterior leaflet. The mitral regurgitation is graded at 3+.   She was referred to the Structural Heart team and was seen by Dr. Burt Ray 08/08/22 with symptoms of persistent SOB, NYHA Class II symptoms. Plan was to proceed with TEER scheduled for 10/04/22. Pre-op labs showed significantly elevated BNP however appeared euvolemic on pre procedure exam.     Additionally she was evaluated by the multidisciplinary valve team and felt to have severe, symptomatic mitral regurgitation and to be a suitable candidate.   She underwent successful transcatheter edge-to-edge mitral valve repair with MitraClip NTW x1 and NT x1 reducing baseline 4+ mitral regurgitation to trace, clips positioned A2/P2. Post op echocardiogram unfortunately showed moderate to severe MR felt secondary to SLDA of the second MitraClip placement. Mean gradient at 41mHg with an average HR at 83bpm.   Today she presents alone and reports that she has been having more SOB since her discharge. She states SOB is mostly when she is ambulating around the house. She will at times need to stop and rest to catch her breath. She does not feel that she is overtly volume overload although she has had some upper extremity/hand edema and intermittent ankle edema. She reports that she has not been short of breath for several months. Her pre TEER labs showed elevated BNP and she was treated with IV Lasix during her hospitalization. She is only on Lasix 259mevery other day outpatient. She denies chest pain, palpitations, dizziness, bleeding, orthopnea, or syncope.   Past Medical History:  Diagnosis Date   Anemia    Anxiety    Back pain    CAD (coronary artery disease) 04/11/2016   S/p ant STEMI 5/17: LHC >> LAD proximal 80%, mid 80%, distal 50%, ostial D1 60%; LCx with LPDA lesion 30%; RCA Mild calcification  with no significant stenosis in a medium caliber, nondominant RCA; LVEF is estimated at 45% with inferoapical and lateral wall akinesis >> PCI: PCI: 3.5 x 24 mm Promus DES to prox LAD, 2.5 x 12 mm Promus DES to mid LAD. // Myoview 7/21: EF 20, no ischemia; high risk    Chest pain    Chronic systolic CHF (congestive heart failure) ([place]) 03/21/2016   Echo 01/30/17: Diff HK, mild focal basal septal hypertrophy, EF 30-35, mild AI, MAC, mild MR // Echo 06/08/16: Mild focal basal septal hypertrophy, EF  25-30%, diff HK, ant-septal AK, Gr 1 DD, mild AI, MAC, mild MR, PASP 37 mmHg // Echo 03/18/16: EF 30-35%, ant-septal AK, Gr 1 DD, mild MR, severe LAE.   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 Burt Ray and Dr. Ali Ray   Sleep apnea    SOB (shortness of breath)    Suicidal ideation 08/27/2018   Swelling    feet or legs    Past Surgical History:  Procedure Laterality Date   APPENDECTOMY     BIV ICD INSERTION CRT-D N/A 01/02/2022   Procedure: BIV ICD INSERTION CRT-D;  Surgeon: Melanie Epley, MD;  Location: [place] [facility];  Service: Cardiovascular;  Laterality: N/A;   CARDIAC CATHETERIZATION N/A 03/17/2016   Procedure: Left Heart Cath and Coronary Angiography;  Surgeon: Melanie Mocha, MD;  Location: [place] [facility];  Service: Cardiovascular;  Laterality: N/A;   CARDIAC CATHETERIZATION N/A 03/17/2016   Procedure: Coronary Stent Intervention;  Surgeon: Melanie Mocha, MD;  Location: [place] [facility];  Service: Cardiovascular;  Laterality: N/A;   COLONOSCOPY WITH PROPOFOL N/A 08/13/2017   Procedure: COLONOSCOPY WITH PROPOFOL;  Surgeon: Jonathon Bellows, MD;  Location[facility] ENDOSCOPY;  Service: Gastroenterology;  Laterality: N/A;   COLONOSCOPY WITH PROPOFOL N/A 03/29/2020   Procedure: COLONOSCOPY WITH PROPOFOL;  Surgeon: Lucilla Lame, MD;  Location: [facility] ENDOSCOPY;  Service: Endoscopy;  Laterality: N/A;   MITRAL VALVE REPAIR N/A 10/04/2022   Procedure: MITRAL VALVE REPAIR;  Surgeon: Melanie Mocha,  MD;  Location: [place] [facility];  Service: Cardiovascular;  Laterality: N/A;   RIGHT/LEFT HEART CATH AND CORONARY ANGIOGRAPHY N/A 08/02/2022   Procedure: RIGHT/LEFT HEART CATH AND CORONARY ANGIOGRAPHY;  Surgeon: Jolaine Artist, MD;  Location: [place] [facility];  Service: Cardiovascular;  Laterality: N/A;   SMALL BOWEL REPAIR     TEE WITHOUT CARDIOVERSION N/A 08/02/2022   Procedure: TRANSESOPHAGEAL ECHOCARDIOGRAM (TEE);  Surgeon: Jolaine Artist, MD;  Location: [facility] ENDOSCOPY;  Service: Cardiovascular;  Laterality: N/A;   TEE WITHOUT CARDIOVERSION N/A 10/04/2022   Procedure: TRANSESOPHAGEAL ECHOCARDIOGRAM (TEE);  Surgeon: Melanie Mocha, MD;  Location: [place] [facility];  Service: Cardiovascular;  Laterality: N/A;   TONSILLECTOMY     UTERINE FIBROID SURGERY      Current Medications: Current Meds  Medication Sig   atorvastatin (LIPITOR)  80 MG tablet Take 1 tablet by mouth once daily   carvedilol (COREG) 12.5 MG tablet TAKE 1 TABLET BY MOUTH TWICE DAILY WITH MEALS   clopidogrel (PLAVIX) 75 MG tablet Take 1 tablet by mouth once daily   dorzolamide-timolol (COSOPT) 22.3-6.8 MG/ML ophthalmic solution Place 1 drop into both eyes 2 (two) times daily.    furosemide (LASIX) 20 MG tablet Take 1 tablet (20 mg total) by mouth daily.   isosorbide mononitrate (IMDUR) 30 MG 24 hr tablet Take 1 tablet by mouth once daily   latanoprost (XALATAN) 0.005 % ophthalmic solution Place 1 drop into both eyes at bedtime.   lumateperone tosylate (CAPLYTA) 42 MG capsule Take 42 mg by mouth at bedtime.   nitroGLYCERIN (NITROSTAT) 0.4 MG SL tablet DISSOLVE ONE TABLET UNDER THE TONGUE EVERY FIVE MINUTES AS NEEDED FOR CHEST PAIN. DO NOT EXCEED A TOTAL OF THREE DOSES IN 15 MINUTES   traZODone (DESYREL) 150 MG tablet Take 150 mg by mouth at bedtime.   valsartan (DIOVAN) 80 MG tablet Take 80 mg by mouth daily.   venlafaxine XR (EFFEXOR-XR) 150 MG 24 hr capsule TAKE 2 CAPSULES BY MOUTH ONCE DAILY (NEEDS to follow-up  with psychiatry for further refills)   [DISCONTINUED] azelastine (ASTELIN) 0.1 % nasal spray Place 2 sprays into both nostrils 2 (two) times daily. Use in each nostril as directed   [DISCONTINUED] furosemide (LASIX) 20 MG tablet Take 20 mg by mouth every other day.     Allergies:   Tetracycline, Meperidine, and Entresto [sacubitril-valsartan]   Social History   Socioeconomic History   Marital status: Married    Spouse name: Samella Lucchetti   Number of children: 0   Years of education: BS in education   Highest education level: Not on file  Occupational History   Occupation:  Retired Education officer, museum  Tobacco Use   Smoking status: Former    Types: Cigarettes    Quit date: 04/13/1997    Years since quitting: 25.5   Smokeless tobacco: Never  Vaping Use   Vaping Use: Never used  Substance and Sexual Activity   Alcohol use: Not Currently   Drug use: No   Sexual activity: Not Currently  Other Topics Concern   Not on file  Social History Narrative   Lives in [place] with spouse.  No children.   Retired first [employer] for over 30 years ([place] for 10 years and then in [place] for over 20 years).   Left-handed   Lives in a two story home       Social Determinants of Health   Financial Resource Strain: Low Risk  (03/30/2022)   Overall Financial Resource Strain (CARDIA)    Difficulty of Paying Living Expenses: Not hard at all  Food Insecurity: No Food Insecurity (10/04/2022)   Hunger Vital Sign    Worried About Running Out of Food in the Last Year: Never true    Ran Out of Food in the Last Year: Never true  Transportation Needs: No Transportation Needs (10/04/2022)   PRAPARE - Hydrologist (Medical): No    Lack of Transportation (Non-Medical): No  Physical Activity: Not on file  Stress: No Stress Concern Present (03/30/2022)   [place]    Feeling of Stress : Not at all   Social Connections: Unknown (03/30/2022)   Social Connection and Isolation Panel [NHANES]    Frequency of Communication with Friends and Family: Not on file  Frequency of Social Gatherings with Friends and Family: Not on file    Attends Religious Services: Not on file    Active Member of Clubs or Organizations: Not on file    Attends Archivist Meetings: Not on file    Marital Status: Married     Family History: The patient's family history includes Anxiety disorder in her maternal aunt and mother; Dementia in her mother; Depression in her mother; Diabetes in her mother; Drug abuse in her cousin; High Cholesterol in her father and mother; Hyperlipidemia in her mother; Hypertension in her father and mother; Mood Disorder in her sister; Paranoid behavior in her mother; Stroke in her sister; Tuberculosis in her paternal grandfather.  ROS:   Please see the history of present illness.    All other systems reviewed and are negative.  EKGs/Labs/Other Studies Reviewed:    The following studies were reviewed today:  Transcatheter edge to edge repair (TEER) 10/04/22:   CONCLUSION: Successful transcatheter edge-to-edge mitral valve repair under fluoroscopic and echo guidance, reducing baseline 4+ mitral regurgitation to trace, clips positioned A2/P2.  _____________   Echo 10/05/22:   1. Left ventricular ejection fraction, by estimation, is 25%. The left  ventricle has severely decreased function. The left ventricle demonstrates  global hypokinesis. The left ventricular internal cavity size was  moderately dilated. Left ventricular  diastolic parameters are consistent with Grade III diastolic dysfunction  (restrictive).   2. Right ventricular systolic function is normal. The right ventricular  size is normal. There is normal pulmonary artery systolic pressure.   3. Left atrial size was severely dilated.   4. Right atrial size was mildly dilated.   5. A small pericardial effusion  is present.   6. The mitral valve has been repaired/replaced. Moderate to severe mitral  valve regurgitation. This likely represted SLDA of the second Mitra Clip  from 10/04/22. The mean mitral valve gradient is 9.0 mmHg with average  heart rate of 83 bpm.   7. Tricuspid valve regurgitation is mild to moderate.   8. The aortic valve was not well visualized. Aortic valve regurgitation  is mild. Aortic valve sclerosis is present, with no evidence of aortic  valve stenosis.   9. The inferior vena cava is normal in size with greater than 50%  respiratory variability, suggesting right atrial pressure of 3 mmHg.  10. Evidence of atrial level shunting detected by color flow Doppler.   Conclusion(s)/Recommendation(s): Discussed with team.   EKG:  EKG is not ordered today.    Recent Labs: 07/17/2022: B Natriuretic Peptide 2,184.7 10/01/2022: ALT 37; NT-Pro BNP 13,775 10/05/2022: BUN 25; Creatinine, Ser 1.13; Hemoglobin 12.2; Platelets 218; Potassium 3.5; Sodium 139   Recent Lipid Panel    Component Value Date/Time   CHOL 139 12/19/2021 1107   CHOL 112 04/18/2020 0815   TRIG 71.0 12/19/2021 1107   HDL 41.60 12/19/2021 1107   HDL 39 (L) 04/18/2020 0815   CHOLHDL 3 12/19/2021 1107   VLDL 14.2 12/19/2021 1107   LDLCALC 83 12/19/2021 1107   LDLCALC 59 04/18/2020 0815   LDLDIRECT 78.0 02/07/2018 0928   Physical Exam:    VS:  BP 120/70 (BP Location: Left Arm, Patient Position: Sitting, Cuff Size: Normal)   Pulse 85   Ht _0  (1.6 m)   Wt 134 lb (60.8 kg)   SpO2 93%   BMI 23.74 kg/m     Wt Readings from Last 3 Encounters:  10/10/22 134 lb (60.8 kg)  10/04/22 130 lb (59  kg)  10/01/22 132 lb 3.2 oz (60 kg)    General: Well developed, well nourished, NAD Lungs:Clear to ausculation bilaterally. No wheezes, rales, or rhonchi. Breathing is unlabored. Cardiovascular: RRR with S1 S2. + murmur Abdomen: Soft, non-tender, non-distended Extremities: Trace BLE edema.  Neuro: Alert and  oriented. No focal deficits. No facial asymmetry. MAE spontaneously. Psych: Responds to questions appropriately with normal affect.    ASSESSMENT/PLAN:    Severe mitral regurgitation: s/p successful transcatheter edge-to-edge mitral valve repair with NTWx1 and NTx1 reducing baseline 4+ mitral regurgitation to trace, clips positioned A2/P2. Post op echocardiogram unfortunately showed moderate to severe MR felt secondary to SLDA of the second MitraClip placement (NT) with a mean gradient at 76mHg with an average HR at 83bpm. Reports NYHA class II symptoms today with worsened SOB since discharge. Pre-op BNP noted to be markedly elevated therefore she was treated with IV Lasix. Does not appear volume overloaded today however she reports upper extremity edema and intermittent ankle edema. Plan to recheck BNP and BMET today and increase PO Lasix to 245mdaily (was on every other day dosing). Follow results and will likely require additional increase to 4031mD. Plan one month echo and OV .  She requires lifelong dental SBE, RX'ed with Amoxicillin. Continue Plavix 24m68m.    Acute on chronic HF: Pre op labs showed elevated BNP at 13775 without overt CHF symptoms on pre-op exam treated with post procedure IV Lasix 40mg91mh great OP. Given worsening SOB since discharge will increase PO Lasix dosing and recheck BNP today. Discussed SLDA found on post procedure echocardiogram which is likely also likely playing a role with her current symptoms. Follow labs. NYHA class II symptoms.    CAD: s/p anterior STEMI 5/17 with PCI/DES x 2 to LAD. TEER workup with LHC 08/02/22 with patent stents and otherwise non-obstructive disease. Denies chest pain. Plan to continue Plavix, beta blocker, statin. Denies anginal symptoms.    HFrEF/ischemic cardiomyopathy: LVEF at 20-25% on most recent TEE despite CRT therapy. Continue current regimen with valsartan, carvedilol, Imdur, and Lasix. See plan above.    HTN: Stable, 120/70 today  with no changes needed at this time. Continue carvedilol, valsartan.    HLD: Continue Lipitor    Known LBBB: s/p CRT-D placement 01/02/22 with most recent TEE 08/02/22 with LVEF at 20-25%.  {  Medication Adjustments/Labs and Tests Ordered: Current medicines are reviewed at length with the patient today.  Concerns regarding medicines are outlined above.  Orders Placed This Encounter  Procedures   Basic metabolic panel   Pro b natriuretic peptide (BNP)   Meds ordered this encounter  Medications   furosemide (LASIX) 20 MG tablet    Sig: Take 1 tablet (20 mg total) by mouth daily.    Dispense:  90 tablet    Refill:  3    Dose INCREASE    Patient Instructions  Medication Instructions:  INCREASE Furosemide to 20mg 19my STOP [place] *If you need a refill on your cardiac medications before your next appointment, please call your pharmacy*   Lab Work: BMET, BNP today If you have labs (blood work) drawn today and your tests are completely normal, you will receive your results only by: [facility] have MyChart) OR A paper copy in the mail If you have any lab test that is abnormal or we need to change your treatment, we will call you to review the results.   Testing/Procedures: NONE   Follow-Up: At [facility]  and your health needs are our priority.  As part of our continuing mission to provide you with exceptional heart care, we have created designated Provider Care Teams.  These Care Teams include your primary Cardiologist (physician) and Advanced Practice Providers (APPs -  Physician Assistants and Nurse Practitioners) who all work together to provide you with the care you need, when you need it.  We recommend signing up for the patient portal called "MyChart".  Sign up information is provided on this After Visit Summary.  MyChart is used to connect with patients for Virtual Visits (Telemedicine).  Patients are able to view lab/test results, encounter  notes, upcoming appointments, etc.  Non-urgent messages can be sent to your provider as well.   To learn more about what you can do with MyChart, go to [email or web address].    Your next appointment:   11/07/22 @ 8:15am  The format for your next appointment:   In Person  Provider:   Kathyrn Drown, NP      Important Information About Sugar         Signed, Kathyrn Drown, NP  10/10/2022 9:38 AM    [name]

## 2022-10-10 ENCOUNTER — Ambulatory Visit (INDEPENDENT_AMBULATORY_CARE_PROVIDER_SITE_OTHER): Payer: Medicare Other | Admitting: Cardiology

## 2022-10-10 ENCOUNTER — Encounter: Payer: Self-pay | Admitting: Cardiology

## 2022-10-10 ENCOUNTER — Ambulatory Visit: Payer: Medicare Other | Attending: Cardiology | Admitting: Cardiology

## 2022-10-10 VITALS — BP 138/68 | HR 91 | Ht 63.0 in | Wt 135.0 lb

## 2022-10-10 VITALS — BP 120/70 | HR 85 | Ht 63.0 in | Wt 134.0 lb

## 2022-10-10 DIAGNOSIS — I447 Left bundle-branch block, unspecified: Secondary | ICD-10-CM | POA: Insufficient documentation

## 2022-10-10 DIAGNOSIS — I340 Nonrheumatic mitral (valve) insufficiency: Secondary | ICD-10-CM | POA: Insufficient documentation

## 2022-10-10 DIAGNOSIS — I5022 Chronic systolic (congestive) heart failure: Secondary | ICD-10-CM

## 2022-10-10 DIAGNOSIS — Z95818 Presence of other cardiac implants and grafts: Secondary | ICD-10-CM | POA: Diagnosis not present

## 2022-10-10 DIAGNOSIS — Z79899 Other long term (current) drug therapy: Secondary | ICD-10-CM | POA: Insufficient documentation

## 2022-10-10 DIAGNOSIS — Z95810 Presence of automatic (implantable) cardiac defibrillator: Secondary | ICD-10-CM | POA: Diagnosis not present

## 2022-10-10 DIAGNOSIS — I255 Ischemic cardiomyopathy: Secondary | ICD-10-CM | POA: Insufficient documentation

## 2022-10-10 DIAGNOSIS — I10 Essential (primary) hypertension: Secondary | ICD-10-CM

## 2022-10-10 DIAGNOSIS — Z9889 Other specified postprocedural states: Secondary | ICD-10-CM

## 2022-10-10 MED ORDER — FUROSEMIDE 20 MG PO TABS: 20.0000 mg | ORAL_TABLET | Freq: Every day | ORAL | 3 refills | Status: DC

## 2022-10-10 NOTE — Patient Instructions (Signed)
Medication Instructions:  Your physician recommends that you continue on your current medications as directed. Please refer to the Current Medication list given to you today.  *If you need a refill on your cardiac medications before your next appointment, please call your pharmacy*   Lab Work: None ordered   Testing/Procedures: None ordered   Follow-Up: At [facility], you and your health needs are our priority.  As part of our continuing mission to provide you with exceptional heart care, we have created designated Provider Care Teams.  These Care Teams include your primary Cardiologist (physician) and Advanced Practice Providers (APPs -  Physician Assistants and Nurse Practitioners) who all work together to provide you with the care you need, when you need it.   Remote monitoring is used to monitor your Pacemaker or ICD from home. This monitoring reduces the number of office visits required to check your device to one time per year. It allows [name] to keep an eye on the functioning of your device to ensure it is working properly. You are scheduled for a device check from home on 01/04/2023. You may send your transmission at any time that day. If you have a wireless device, the transmission will be sent automatically. After your physician reviews your transmission, you will receive a postcard with your next transmission date.  Your next appointment:   1 year(s)  The format for your next appointment:   In Person  Provider:   Mamie Levers, NP     Thank you for choosing [facility]!!   [phone]    Other Instructions  Important Information About Sugar

## 2022-10-10 NOTE — Progress Notes (Signed)
Electrophysiology Office Follow up Visit Note:    Date:  10/10/2022   ID:  Geana, Walts 16-Jul-1951, MRN [id number]  PCP:  Leone Haven, MD  [facility] Cardiologist:  Sherren Mocha, MD  [facility] Electrophysiologist:  Vickie Epley, MD    Interval History:    Melanie Ray is a 71 y.o. female who presents for a follow up visit.   I last saw her 04/04/2022 in follow up for her chronic systolic heart failure and ICD.  She underwent mitraclip 10/04/2022 with improvement in her post procedural MR to trace from severe. Her most recent EF was 25% on 10/05/2022 with residual moderate-severe MR with a failed clip.   Today she tells me that she continues to feel fatigued with minimal exertion. No syncope/presyncope.      Past Medical History:  Diagnosis Date   Anemia    Anxiety    Back pain    CAD (coronary artery disease) 04/11/2016   S/p ant STEMI 5/17: LHC >> LAD proximal 80%, mid 80%, distal 50%, ostial D1 60%; LCx with LPDA lesion 30%; RCA Mild calcification with no significant stenosis in a medium caliber, nondominant RCA; LVEF is estimated at 45% with inferoapical and lateral wall akinesis >> PCI: PCI: 3.5 x 24 mm Promus DES to prox LAD, 2.5 x 12 mm Promus DES to mid LAD. // Myoview 7/21: EF 20, no ischemia; high risk    Chest pain    Chronic systolic CHF (congestive heart failure) ([place]) 03/21/2016   Echo 01/30/17: Diff HK, mild focal basal septal hypertrophy, EF 30-35, mild AI, MAC, mild MR // Echo 06/08/16: Mild focal basal septal hypertrophy, EF 25-30%, diff HK, ant-septal AK, Gr 1 DD, mild AI, MAC, mild MR, PASP 37 mmHg // Echo 03/18/16: EF 30-35%, ant-septal AK, Gr 1 DD, mild MR, severe LAE.   Constipation    Depression    Diabetes mellitus type 2 in obese (HCC)    Dizziness    Glaucoma    Gout    Heart disease    Heartburn    History of acute anterior wall MI 03/17/2016   History of heart attack    Hyperlipemia    Hypertension     Ischemic cardiomyopathy 10/02/2016   Refused ICD   Joint pain    Left bundle branch block    Myocardial infarction (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 Burt Knack and Dr. Ali Lowe   Sleep apnea    SOB (shortness of breath)    Suicidal ideation 08/27/2018   Swelling    feet or legs    Past Surgical History:  Procedure Laterality Date   APPENDECTOMY     BIV ICD INSERTION CRT-D N/A 01/02/2022   Procedure: BIV ICD INSERTION CRT-D;  Surgeon: Vickie Epley, MD;  Location: [place] CV [facility];  Service: Cardiovascular;  Laterality: N/A;   CARDIAC CATHETERIZATION N/A 03/17/2016   Procedure: Left Heart Cath and Coronary Angiography;  Surgeon: Sherren Mocha, MD;  Location: [place] CV [facility];  Service: Cardiovascular;  Laterality: N/A;   CARDIAC CATHETERIZATION N/A 03/17/2016   Procedure: Coronary Stent Intervention;  Surgeon: Sherren Mocha, MD;  Location: [place] [facility];  Service: Cardiovascular;  Laterality: N/A;   COLONOSCOPY WITH PROPOFOL N/A 08/13/2017   Procedure: COLONOSCOPY WITH PROPOFOL;  Surgeon: Jonathon Bellows, MD;  Location: [facility] ENDOSCOPY;  Service: Gastroenterology;  Laterality: N/A;   COLONOSCOPY WITH PROPOFOL N/A 03/29/2020   Procedure: COLONOSCOPY WITH PROPOFOL;  Surgeon: Lucilla Lame, MD;  Location: [facility] ENDOSCOPY;  Service: Endoscopy;  Laterality: N/A;   MITRAL VALVE REPAIR N/A 10/04/2022   Procedure: MITRAL VALVE REPAIR;  Surgeon: Sherren Mocha, MD;  Location: [place] CV LAB;  Service: Cardiovascular;  Laterality: N/A;   RIGHT/LEFT HEART CATH AND CORONARY ANGIOGRAPHY N/A 08/02/2022   Procedure: RIGHT/LEFT HEART CATH AND CORONARY ANGIOGRAPHY;  Surgeon: Jolaine Artist, MD;  Location: [place] [facility] LAB;  Service: Cardiovascular;  Laterality: N/A;   SMALL BOWEL REPAIR     TEE WITHOUT CARDIOVERSION N/A 08/02/2022   Procedure:  TRANSESOPHAGEAL ECHOCARDIOGRAM (TEE);  Surgeon: Jolaine Artist, MD;  Location: [facility] ENDOSCOPY;  Service: Cardiovascular;  Laterality: N/A;   TEE WITHOUT CARDIOVERSION N/A 10/04/2022   Procedure: TRANSESOPHAGEAL ECHOCARDIOGRAM (TEE);  Surgeon: Sherren Mocha, MD;  Location: [place] [facility];  Service: Cardiovascular;  Laterality: N/A;   TONSILLECTOMY     UTERINE FIBROID SURGERY      Current Medications: Current Meds  Medication Sig   atorvastatin (LIPITOR) 80 MG tablet Take 1 tablet by mouth once daily   carvedilol (COREG) 12.5 MG tablet TAKE 1 TABLET BY MOUTH TWICE DAILY WITH MEALS   clopidogrel (PLAVIX) 75 MG tablet Take 1 tablet by mouth once daily   dorzolamide-timolol (COSOPT) 22.3-6.8 MG/ML ophthalmic solution Place 1 drop into both eyes 2 (two) times daily.    furosemide (LASIX) 20 MG tablet Take 1 tablet (20 mg total) by mouth daily.   isosorbide mononitrate (IMDUR) 30 MG 24 hr tablet Take 1 tablet by mouth once daily   latanoprost (XALATAN) 0.005 % ophthalmic solution Place 1 drop into both eyes at bedtime.   lumateperone tosylate (CAPLYTA) 42 MG capsule Take 42 mg by mouth at bedtime.   nitroGLYCERIN (NITROSTAT) 0.4 MG SL tablet DISSOLVE ONE TABLET UNDER THE TONGUE EVERY FIVE MINUTES AS NEEDED FOR CHEST PAIN. DO NOT EXCEED A TOTAL OF THREE DOSES IN 15 MINUTES   traZODone (DESYREL) 150 MG tablet Take 150 mg by mouth at bedtime.   valsartan (DIOVAN) 80 MG tablet Take 80 mg by mouth daily.   venlafaxine XR (EFFEXOR-XR) 150 MG 24 hr capsule TAKE 2 CAPSULES BY MOUTH ONCE DAILY (NEEDS to follow-up with psychiatry for further refills)     Allergies:   Tetracycline, Meperidine, and Entresto [sacubitril-valsartan]   Social History   Socioeconomic History   Marital status: Married    Spouse name: Aasiya Creasey   Number of children: 0   Years of education: BS in education   Highest education level: Not on file  Occupational History   Occupation:  Retired Education officer, museum   Tobacco Use   Smoking status: Former    Types: Cigarettes    Quit date: 04/13/1997    Years since quitting: 25.5   Smokeless tobacco: Never  Vaping Use   Vaping Use: Never used  Substance and Sexual Activity   Alcohol use: Not Currently   Drug use: No   Sexual activity: Not Currently  Other Topics Concern   Not on file  Social History Narrative   Lives in [place] with spouse.  No children.   Retired first [employer] for over 30 years ([place] for 10 years and then in [place] for over 20 years).   Left-handed   Lives in a two story home       Social Determinants of Health   Financial Resource Strain:  Low Risk  (03/30/2022)   Overall Financial Resource Strain (CARDIA)    Difficulty of Paying Living Expenses: Not hard at all  Food Insecurity: No Food Insecurity (10/04/2022)   Hunger Vital Sign    Worried About Running Out of Food in the Last Year: Never true    Ran Out of Food in the Last Year: Never true  Transportation Needs: No Transportation Needs (10/04/2022)   PRAPARE - Hydrologist (Medical): No    Lack of Transportation (Non-Medical): No  Physical Activity: Not on file  Stress: No Stress Concern Present (03/30/2022)   [place]    Feeling of Stress : Not at all  Social Connections: Unknown (03/30/2022)   Social Connection and Isolation Panel [NHANES]    Frequency of Communication with Friends and Family: Not on file    Frequency of Social Gatherings with Friends and Family: Not on file    Attends Religious Services: Not on file    Active Member of Clubs or Organizations: Not on file    Attends Archivist Meetings: Not on file    Marital Status: Married     Family History: The patient's family history includes Anxiety disorder in her maternal aunt and mother; Dementia in her mother; Depression in her mother; Diabetes in her mother; Drug abuse in her  cousin; High Cholesterol in her father and mother; Hyperlipidemia in her mother; Hypertension in her father and mother; Mood Disorder in her sister; Paranoid behavior in her mother; Stroke in her sister; Tuberculosis in her paternal grandfather.  ROS:   Please see the history of present illness.    All other systems reviewed and are negative.  EKGs/Labs/Other Studies Reviewed:    The following studies were reviewed today:  10/10/2022 In clinic device interrogation personally reviewed Battery longevity 6 years Lead parameters stable Biv pacing >99% No HV therapies delivered  Recent Labs: 07/17/2022: B Natriuretic Peptide 2,184.7 10/01/2022: ALT 37 10/05/2022: Hemoglobin 12.2; Platelets 218 10/10/2022: BUN 21; Creatinine, Ser 1.03; NT-Pro BNP WILL FOLLOW; Potassium 4.6; Sodium 143  Recent Lipid Panel    Component Value Date/Time   CHOL 139 12/19/2021 1107   CHOL 112 04/18/2020 0815   TRIG 71.0 12/19/2021 1107   HDL 41.60 12/19/2021 1107   HDL 39 (L) 04/18/2020 0815   CHOLHDL 3 12/19/2021 1107   VLDL 14.2 12/19/2021 1107   LDLCALC 83 12/19/2021 1107   LDLCALC 59 04/18/2020 0815   LDLDIRECT 78.0 02/07/2018 0928    Physical Exam:    VS:  BP 138/68   Pulse 91   Ht _0  (1.6 m)   Wt 135 lb (61.2 kg)   SpO2 96%   BMI 23.91 kg/m     Wt Readings from Last 3 Encounters:  10/10/22 135 lb (61.2 kg)  10/10/22 134 lb (60.8 kg)  10/04/22 130 lb (59 kg)     GEN:  Well nourished, well developed in no acute distress HEENT: Normal NECK: No JVD; No carotid bruits LYMPHATICS: No lymphadenopathy CARDIAC: RRR, III/VI systolic murmur at the left lower sternal border, rubs, gallops. CRTD pocket well healed RESPIRATORY:  Clear to auscultation without rales, wheezing or rhonchi  ABDOMEN: Soft, non-tender, non-distended MUSCULOSKELETAL:  No edema; No deformity  SKIN: Warm and dry NEUROLOGIC:  Alert and oriented x 3 PSYCHIATRIC:  Normal affect        ASSESSMENT:    1. Chronic  systolic CHF (congestive heart failure) ([place])  2. Left bundle branch block   3. ICD (implantable cardioverter-defibrillator) in place    PLAN:    In order of problems listed above:   #Chronic systolic heart failure #LBBB NYHA III symptoms.  S/p ICD with left bundle area lead. QRS is narrow while paced (~163m). EF 25% on recent echo with mod-severe MR. Continue GDMT with coreg, lasix, imdur, valsartan.  I discussed case with Dr CBurt Knack Plan to get her back into HF clinic for hemodynamic eval. May need RHC. I suspect her valve issue is the driver of her symptoms.   #ICD in situ Device functioning appropriately. Continue remote monitoring.  Follow up 1 year with APP.    Medication Adjustments/Labs and Tests Ordered: Current medicines are reviewed at length with the patient today.  Concerns regarding medicines are outlined above.  No orders of the defined types were placed in this encounter.  No orders of the defined types were placed in this encounter.    Signed, CLars Mage MD, F[facility]12/04/2022 9:19 PM    Electrophysiology [facility]

## 2022-10-10 NOTE — Patient Instructions (Signed)
Medication Instructions:  INCREASE Furosemide to '20mg'$  daily STOP Astelin *If you need a refill on your cardiac medications before your next appointment, please call your pharmacy*   Lab Work: BMET, BNP today If you have labs (blood work) drawn today and your tests are completely normal, you will receive your results only by: [place] (if you have MyChart) OR A paper copy in the mail If you have any lab test that is abnormal or we need to change your treatment, we will call you to review the results.   Testing/Procedures: NONE   Follow-Up: At [facility], you and your health needs are our priority.  As part of our continuing mission to provide you with exceptional heart care, we have created designated Provider Care Teams.  These Care Teams include your primary Cardiologist (physician) and Advanced Practice Providers (APPs -  Physician Assistants and Nurse Practitioners) who all work together to provide you with the care you need, when you need it.  We recommend signing up for the patient portal called "MyChart".  Sign up information is provided on this After Visit Summary.  MyChart is used to connect with patients for Virtual Visits (Telemedicine).  Patients are able to view lab/test results, encounter notes, upcoming appointments, etc.  Non-urgent messages can be sent to your provider as well.   To learn more about what you can do with MyChart, go to [email or web address].    Your next appointment:   11/07/22 @ 8:15am  The format for your next appointment:   In Person  Provider:   Kathyrn Drown, NP      Important Information About Sugar

## 2022-10-12 ENCOUNTER — Ambulatory Visit: Payer: Medicare Other

## 2022-10-12 LAB — BASIC METABOLIC PANEL
BUN/Creatinine Ratio: 20 (ref 12–28)
BUN: 21 mg/dL (ref 8–27)
CO2: 23 mmol/L (ref 20–29)
Calcium: 9.6 mg/dL (ref 8.7–10.3)
Chloride: 108 mmol/L — ABNORMAL HIGH (ref 96–106)
Creatinine, Ser: 1.03 mg/dL — ABNORMAL HIGH (ref 0.57–1.00)
Glucose: 121 mg/dL — ABNORMAL HIGH (ref 70–99)
Potassium: 4.6 mmol/L (ref 3.5–5.2)
Sodium: 143 mmol/L (ref 134–144)
eGFR: 58 mL/min/{1.73_m2} — ABNORMAL LOW (ref >59)

## 2022-10-12 LAB — PRO B NATRIURETIC PEPTIDE: NT-Pro BNP: 9776 pg/mL — ABNORMAL HIGH (ref 0–301)

## 2022-10-14 ENCOUNTER — Encounter (HOSPITAL_COMMUNITY): Payer: Self-pay

## 2022-10-14 ENCOUNTER — Other Ambulatory Visit: Payer: Self-pay

## 2022-10-14 ENCOUNTER — Emergency Department
Admission: EM | Admit: 2022-10-14 | Discharge: 2022-10-14 | Disposition: A | Discharge status: Home / Self Care | Payer: Medicare Other | Attending: Emergency Medicine | Admitting: Emergency Medicine

## 2022-10-14 ENCOUNTER — Emergency Department: Payer: Medicare Other

## 2022-10-14 ENCOUNTER — Inpatient Hospital Stay (HOSPITAL_COMMUNITY)
Admission: AD | Admit: 2022-10-14 | Discharge: 2022-10-16 | DRG: 286 | Disposition: A | Discharge status: Home / Self Care | Payer: Medicare Other | Attending: Internal Medicine | Admitting: Internal Medicine

## 2022-10-14 DIAGNOSIS — I110 Hypertensive heart disease with heart failure: Principal | ICD-10-CM | POA: Diagnosis present

## 2022-10-14 DIAGNOSIS — Z818 Family history of other mental and behavioral disorders: Secondary | ICD-10-CM

## 2022-10-14 DIAGNOSIS — Z79899 Other long term (current) drug therapy: Secondary | ICD-10-CM

## 2022-10-14 DIAGNOSIS — I340 Nonrheumatic mitral (valve) insufficiency: Secondary | ICD-10-CM

## 2022-10-14 DIAGNOSIS — Z955 Presence of coronary angioplasty implant and graft: Secondary | ICD-10-CM | POA: Diagnosis not present

## 2022-10-14 DIAGNOSIS — R0602 Shortness of breath: Secondary | ICD-10-CM | POA: Diagnosis not present

## 2022-10-14 DIAGNOSIS — Z01810 Encounter for preprocedural cardiovascular examination: Secondary | ICD-10-CM | POA: Diagnosis not present

## 2022-10-14 DIAGNOSIS — I255 Ischemic cardiomyopathy: Secondary | ICD-10-CM | POA: Diagnosis present

## 2022-10-14 DIAGNOSIS — R0609 Other forms of dyspnea: Secondary | ICD-10-CM | POA: Diagnosis not present

## 2022-10-14 DIAGNOSIS — I252 Old myocardial infarction: Secondary | ICD-10-CM

## 2022-10-14 DIAGNOSIS — F419 Anxiety disorder, unspecified: Secondary | ICD-10-CM | POA: Diagnosis present

## 2022-10-14 DIAGNOSIS — Z7902 Long term (current) use of antithrombotics/antiplatelets: Secondary | ICD-10-CM | POA: Diagnosis not present

## 2022-10-14 DIAGNOSIS — Z95810 Presence of automatic (implantable) cardiac defibrillator: Secondary | ICD-10-CM | POA: Insufficient documentation

## 2022-10-14 DIAGNOSIS — I447 Left bundle-branch block, unspecified: Secondary | ICD-10-CM | POA: Diagnosis present

## 2022-10-14 DIAGNOSIS — I2510 Atherosclerotic heart disease of native coronary artery without angina pectoris: Secondary | ICD-10-CM

## 2022-10-14 DIAGNOSIS — Z87891 Personal history of nicotine dependence: Secondary | ICD-10-CM | POA: Diagnosis not present

## 2022-10-14 DIAGNOSIS — Z823 Family history of stroke: Secondary | ICD-10-CM | POA: Diagnosis not present

## 2022-10-14 DIAGNOSIS — E119 Type 2 diabetes mellitus without complications: Secondary | ICD-10-CM | POA: Insufficient documentation

## 2022-10-14 DIAGNOSIS — I5043 Acute on chronic combined systolic (congestive) and diastolic (congestive) heart failure: Secondary | ICD-10-CM | POA: Diagnosis not present

## 2022-10-14 DIAGNOSIS — R0689 Other abnormalities of breathing: Secondary | ICD-10-CM | POA: Diagnosis not present

## 2022-10-14 DIAGNOSIS — I5022 Chronic systolic (congestive) heart failure: Secondary | ICD-10-CM | POA: Insufficient documentation

## 2022-10-14 DIAGNOSIS — E876 Hypokalemia: Secondary | ICD-10-CM | POA: Diagnosis present

## 2022-10-14 DIAGNOSIS — F32A Depression, unspecified: Secondary | ICD-10-CM | POA: Diagnosis present

## 2022-10-14 DIAGNOSIS — I5023 Acute on chronic systolic (congestive) heart failure: Secondary | ICD-10-CM

## 2022-10-14 DIAGNOSIS — M109 Gout, unspecified: Secondary | ICD-10-CM | POA: Diagnosis present

## 2022-10-14 DIAGNOSIS — I509 Heart failure, unspecified: Secondary | ICD-10-CM | POA: Diagnosis not present

## 2022-10-14 DIAGNOSIS — Z8249 Family history of ischemic heart disease and other diseases of the circulatory system: Secondary | ICD-10-CM

## 2022-10-14 DIAGNOSIS — Z833 Family history of diabetes mellitus: Secondary | ICD-10-CM | POA: Diagnosis not present

## 2022-10-14 DIAGNOSIS — E785 Hyperlipidemia, unspecified: Secondary | ICD-10-CM | POA: Diagnosis present

## 2022-10-14 HISTORY — DX: Nonrheumatic mitral (valve) insufficiency: I34.0

## 2022-10-14 LAB — BASIC METABOLIC PANEL
Anion gap: 6 (ref 5–15)
BUN: 22 mg/dL (ref 8–23)
CO2: 24 mmol/L (ref 22–32)
Calcium: 9.4 mg/dL (ref 8.9–10.3)
Chloride: 109 mmol/L (ref 98–111)
Creatinine, Ser: 0.94 mg/dL (ref 0.44–1.00)
GFR, Estimated: >60 mL/min (ref >60)
Glucose, Bld: 102 mg/dL — ABNORMAL HIGH (ref 70–99)
Potassium: 3.5 mmol/L (ref 3.5–5.1)
Sodium: 139 mmol/L (ref 135–145)

## 2022-10-14 LAB — CBC
HCT: 32.3 % — ABNORMAL LOW (ref 36.0–46.0)
Hemoglobin: 10.4 g/dL — ABNORMAL LOW (ref 12.0–15.0)
MCH: 27.6 pg (ref 26.0–34.0)
MCHC: 32.2 g/dL (ref 30.0–36.0)
MCV: 85.7 fL (ref 80.0–100.0)
Platelets: 215 10*3/uL (ref 150–400)
RBC: 3.77 MIL/uL — ABNORMAL LOW (ref 3.87–5.11)
RDW: 14.8 % (ref 11.5–15.5)
WBC: 5.8 10*3/uL (ref 4.0–10.5)
nRBC: 0 % (ref 0.0–0.2)

## 2022-10-14 LAB — CBC WITH DIFFERENTIAL/PLATELET
Abs Immature Granulocytes: 0.01 10*3/uL (ref 0.00–0.07)
Basophils Absolute: 0 10*3/uL (ref 0.0–0.1)
Basophils Relative: 1 %
Eosinophils Absolute: 0.1 10*3/uL (ref 0.0–0.5)
Eosinophils Relative: 1 %
HCT: 34.1 % — ABNORMAL LOW (ref 36.0–46.0)
Hemoglobin: 10.5 g/dL — ABNORMAL LOW (ref 12.0–15.0)
Immature Granulocytes: 0 %
Lymphocytes Relative: 18 %
Lymphs Abs: 1.1 10*3/uL (ref 0.7–4.0)
MCH: 27.1 pg (ref 26.0–34.0)
MCHC: 30.8 g/dL (ref 30.0–36.0)
MCV: 87.9 fL (ref 80.0–100.0)
Monocytes Absolute: 0.7 10*3/uL (ref 0.1–1.0)
Monocytes Relative: 12 %
Neutro Abs: 4.1 10*3/uL (ref 1.7–7.7)
Neutrophils Relative %: 68 %
Platelets: 208 10*3/uL (ref 150–400)
RBC: 3.88 MIL/uL (ref 3.87–5.11)
RDW: 14.6 % (ref 11.5–15.5)
WBC: 6 10*3/uL (ref 4.0–10.5)
nRBC: 0 % (ref 0.0–0.2)

## 2022-10-14 LAB — CREATININE, SERUM
Creatinine, Ser: 1.15 mg/dL — ABNORMAL HIGH (ref 0.44–1.00)
GFR, Estimated: 51 mL/min — ABNORMAL LOW (ref >60)

## 2022-10-14 LAB — TROPONIN I (HIGH SENSITIVITY)
Troponin I (High Sensitivity): 80 ng/L — ABNORMAL HIGH (ref <18)
Troponin I (High Sensitivity): 92 ng/L — ABNORMAL HIGH (ref <18)

## 2022-10-14 LAB — MRSA NEXT GEN BY PCR, NASAL: MRSA by PCR Next Gen: NOT DETECTED

## 2022-10-14 LAB — BRAIN NATRIURETIC PEPTIDE: B Natriuretic Peptide: 1220.2 pg/mL — ABNORMAL HIGH (ref 0.0–100.0)

## 2022-10-14 LAB — GLUCOSE, CAPILLARY: Glucose-Capillary: 123 mg/dL — ABNORMAL HIGH (ref 70–99)

## 2022-10-14 MED ORDER — LATANOPROST 0.005 % OP SOLN
1.0000 [drp] | Freq: Every day | OPHTHALMIC | Status: DC
  Administered 2022-10-14 – 2022-10-15 (×2): 1 [drp] via OPHTHALMIC
  Filled 2022-10-14: qty 2.5

## 2022-10-14 MED ORDER — SODIUM CHLORIDE 0.9 % IV SOLN: 250.0000 mL | INTRAVENOUS | Status: DC | PRN

## 2022-10-14 MED ORDER — ZOLPIDEM TARTRATE 5 MG PO TABS
5.0000 mg | ORAL_TABLET | Freq: Every evening | ORAL | Status: AC | PRN
  Administered 2022-10-14: 5 mg via ORAL
  Filled 2022-10-14: qty 1

## 2022-10-14 MED ORDER — ENOXAPARIN SODIUM 40 MG/0.4ML IJ SOSY
40.0000 mg | PREFILLED_SYRINGE | INTRAMUSCULAR | Status: DC
  Administered 2022-10-14 – 2022-10-15 (×2): 40 mg via SUBCUTANEOUS
  Filled 2022-10-14 (×2): qty 0.4

## 2022-10-14 MED ORDER — SODIUM CHLORIDE 0.9 % IV SOLN: INTRAVENOUS | Status: DC

## 2022-10-14 MED ORDER — INSULIN ASPART 100 UNIT/ML IJ SOLN: 0.0000 [IU] | Freq: Three times a day (TID) | INTRAMUSCULAR | Status: DC

## 2022-10-14 MED ORDER — ISOSORBIDE MONONITRATE ER 30 MG PO TB24
30.0000 mg | ORAL_TABLET | Freq: Every day | ORAL | Status: DC
  Administered 2022-10-15 – 2022-10-16 (×2): 30 mg via ORAL
  Filled 2022-10-14 (×2): qty 1

## 2022-10-14 MED ORDER — ATORVASTATIN CALCIUM 80 MG PO TABS: 80.0000 mg | ORAL_TABLET | Freq: Every day | ORAL | Status: DC

## 2022-10-14 MED ORDER — ACETAMINOPHEN 325 MG PO TABS
650.0000 mg | ORAL_TABLET | ORAL | Status: DC | PRN
  Filled 2022-10-14: qty 2

## 2022-10-14 MED ORDER — ATORVASTATIN CALCIUM 80 MG PO TABS
80.0000 mg | ORAL_TABLET | Freq: Every day | ORAL | Status: DC
  Administered 2022-10-15 – 2022-10-16 (×2): 80 mg via ORAL
  Filled 2022-10-14 (×2): qty 1

## 2022-10-14 MED ORDER — SODIUM CHLORIDE 0.9% FLUSH
3.0000 mL | Freq: Two times a day (BID) | INTRAVENOUS | Status: DC
  Administered 2022-10-14 – 2022-10-16 (×3): 3 mL via INTRAVENOUS

## 2022-10-14 MED ORDER — CARVEDILOL 6.25 MG PO TABS
6.2500 mg | ORAL_TABLET | Freq: Two times a day (BID) | ORAL | Status: DC
  Administered 2022-10-14 – 2022-10-16 (×4): 6.25 mg via ORAL
  Filled 2022-10-14 (×3): qty 1

## 2022-10-14 MED ORDER — VENLAFAXINE HCL ER 150 MG PO CP24
300.0000 mg | ORAL_CAPSULE | Freq: Every day | ORAL | Status: DC
  Administered 2022-10-15 – 2022-10-16 (×2): 300 mg via ORAL
  Filled 2022-10-14 (×3): qty 2

## 2022-10-14 MED ORDER — TRAZODONE HCL 50 MG PO TABS
150.0000 mg | ORAL_TABLET | Freq: Every day | ORAL | Status: DC
  Administered 2022-10-15: 150 mg via ORAL
  Filled 2022-10-14: qty 3

## 2022-10-14 MED ORDER — SODIUM CHLORIDE 0.9% FLUSH: 3.0000 mL | INTRAVENOUS | Status: DC | PRN

## 2022-10-14 MED ORDER — FUROSEMIDE 20 MG PO TABS
20.0000 mg | ORAL_TABLET | Freq: Every day | ORAL | Status: DC
  Administered 2022-10-15 – 2022-10-16 (×2): 20 mg via ORAL
  Filled 2022-10-14 (×3): qty 1

## 2022-10-14 MED ORDER — DORZOLAMIDE HCL-TIMOLOL MAL 2-0.5 % OP SOLN
1.0000 [drp] | Freq: Two times a day (BID) | OPHTHALMIC | Status: DC
  Administered 2022-10-14 – 2022-10-16 (×4): 1 [drp] via OPHTHALMIC
  Filled 2022-10-14: qty 10

## 2022-10-14 MED ORDER — IRBESARTAN 75 MG PO TABS
75.0000 mg | ORAL_TABLET | Freq: Every day | ORAL | Status: DC
  Administered 2022-10-15 – 2022-10-16 (×2): 75 mg via ORAL
  Filled 2022-10-14 (×2): qty 1

## 2022-10-14 MED ORDER — FUROSEMIDE 10 MG/ML IJ SOLN
20.0000 mg | Freq: Once | INTRAMUSCULAR | Status: AC
  Administered 2022-10-14: 20 mg via INTRAVENOUS
  Filled 2022-10-14: qty 4

## 2022-10-14 MED ORDER — LUMATEPERONE TOSYLATE 42 MG PO CAPS
42.0000 mg | ORAL_CAPSULE | Freq: Every day | ORAL | Status: DC
  Administered 2022-10-14 – 2022-10-15 (×2): 42 mg via ORAL
  Filled 2022-10-14 (×3): qty 1

## 2022-10-14 MED ORDER — SODIUM CHLORIDE 0.9% FLUSH
3.0000 mL | Freq: Two times a day (BID) | INTRAVENOUS | Status: DC
  Administered 2022-10-15 (×2): 3 mL via INTRAVENOUS

## 2022-10-14 MED ORDER — CARVEDILOL 6.25 MG PO TABS
6.2500 mg | ORAL_TABLET | Freq: Two times a day (BID) | ORAL | Status: DC
  Filled 2022-10-14: qty 1

## 2022-10-14 NOTE — ED Provider Notes (Addendum)
[facility] Provider Note    Event Date/Time   First MD Initiated Contact with Patient 10/14/22 1107     (approximate)   History   Chief Complaint: Shortness of Breath   HPI  Melanie Ray is a 71 y.o. female with a history of hypertension diabetes chronic CHF ICD implantation and recent TEER of mitral valve on October 04, 2022 by cardiology Dr. Burt Knack, complicated by single leaflet device attachment resulting in recurrent severe mitral regurgitation and symptomatology.  Patient last followed up with cardiology 4 days ago on December 6.  Lasix was increased and symptoms were attributed to her mitral regurgitation.  Patient reports that over the last few days, symptoms continue to be severe.  She has dyspnea on exertion, can only walk a few steps without having to stop and rest.  Denies chest pain.  No fever chills or cough.  She has been compliant with her medications.  Denies orthopnea.     Physical Exam   Triage Vital Signs: ED Triage Vitals  Enc Vitals Group     BP 10/14/22 1023 (!) 149/72     Pulse Rate 10/14/22 1023 72     Resp 10/14/22 1023 19     Temp 10/14/22 1023 97.6 F (36.4 C)     Temp Source 10/14/22 1023 Oral     SpO2 10/14/22 1023 100 %     Weight 10/14/22 1027 130 lb (59 kg)     Height 10/14/22 1027 '5\' 3"'$  (1.6 m)     Head Circumference --      Peak Flow --      Pain Score 10/14/22 1026 0     Pain Loc --      Pain Edu? --      Excl. in [place]? --     Most recent vital signs: Vitals:   10/14/22 1145 10/14/22 1149  BP: (!) 153/83   Pulse: 72   Resp: 19   Temp:    SpO2: 100% 100%    General: Awake, no distress.  CV:  Good peripheral perfusion.  Regular rate and rhythm.  Holosystolic murmur.  No JVD, no orthopnea on exam Resp:  Normal effort.  Clear to auscultation bilaterally Abd:  No distention.  Soft nontender Other:  No lower extremity edema.  No calf tenderness.   ED Results / Procedures / Treatments    Labs (all labs ordered are listed, but only abnormal results are displayed) Labs Reviewed  BASIC METABOLIC PANEL - Abnormal; Notable for the following components:      Result Value   Glucose, Bld 102 (*)    All other components within normal limits  BRAIN NATRIURETIC PEPTIDE - Abnormal; Notable for the following components:   B Natriuretic Peptide 1,220.2 (*)    All other components within normal limits  CBC WITH DIFFERENTIAL/PLATELET - Abnormal; Notable for the following components:   Hemoglobin 10.5 (*)    HCT 34.1 (*)    All other components within normal limits  TROPONIN I (HIGH SENSITIVITY) - Abnormal; Notable for the following components:   Troponin I (High Sensitivity) 80 (*)    All other components within normal limits  TROPONIN I (HIGH SENSITIVITY)     EKG Interpreted by me Dual paced rhythm, rate of 73.  Normal axis, left bundle branch block.  No acute ischemic changes.   RADIOLOGY Chest x-ray interpreted by me, appears normal.  No edema.  Radiology report reviewed   PROCEDURES:  Procedures   MEDICATIONS  ORDERED IN ED: Medications  furosemide (LASIX) injection 20 mg (20 mg Intravenous Given 10/14/22 1234)     IMPRESSION / MDM / ASSESSMENT AND PLAN / ED COURSE  I reviewed the triage vital signs and the nursing notes.                              Differential diagnosis includes, but is not limited to, acute decompensated CHF, symptomatic mitral regurgitation, pulm edema, pleural effusion, pneumonia, pneumothorax, non-STEMI  Patient's presentation is most consistent with acute presentation with potential threat to life or bodily function.  Patient presents with persistent severe dyspnea on exertion, gradually worsening after unsuccessful transcatheter mitral valve repair.  Vital signs are stable.  Oxygenation is normal.  Lungs are clear, clinically not volume overloaded.  Labs appear to be at baseline.  Troponin slightly elevated at 80 which I think is  due to a degree of strain and not ACS.  BNP elevated, but less so than a month ago.  Discussed with cardiology who will come evaluate.  They recommend admission in preparation for right heart catheterization and evaluation of further surgical options.    ----------------------------------------- 1:07 PM on 10/14/2022 ----------------------------------------- Update from Dr. Quentin Ore cardiology is that they will plan for transfer to [facility] for further management on cardiology service with Dr. Haroldine Laws. Will contact [facility] to request bed and transfer     FINAL CLINICAL IMPRESSION(S) / ED DIAGNOSES   Final diagnoses:  DOE (dyspnea on exertion)  Mitral valve insufficiency, unspecified etiology  Chronic systolic congestive heart failure ([place])     Rx / DC Orders   ED Discharge Orders     None        Note:  This document was prepared using Dragon voice recognition software and may include unintentional dictation errors.   [name], MD 10/14/22 1217   [name], MD 10/14/22 [phone]

## 2022-10-14 NOTE — Progress Notes (Signed)
Patient arrived via [facility] on stretcher. Able to stand and get a standing weight without difficulty other than a little dizzy upon standing.  Dr. Haroldine Laws paged and made aware that patient was here on the unit.  Awaiting orders.

## 2022-10-14 NOTE — H&P (Addendum)
Advanced Heart Failure Team History and Physical Note   Primary Physician: Leone Haven, MD Primary Cardiologist:  Sherren Mocha, MD HF cardiologist: DB  Reason for Admission: Acute on chronic systolic HF/severe MR   HPI:    Melanie Ray is a 71 y.o. female with a hx of CAD s/p anterior STEMI 5/17 with PCI/DES x 2 to LAD, HFrEF/ischemic cardiomyopathy with LVEF at 20-25%, DM2, HTN, HLD, LBBB with CRT-D placement 01/02/22, OSA, and severe mitral regurgitation who underwent TEER with MitraClip 10/04/22.    She has a long hx of ischemic cardiomyopathy not responsive to CRT therapy. She underwent an echocardiogram 07/17/2022 showing an LVEF at 20 to 25%, normal RV function, and severe central mitral regurgitation. Subsequent LHC and TEE demonstrated continued patency of her LAD stents with residual nonobstructive disease. She was noted to have elevated PCWP with prominent V waves of 37 mmHg consistent with hemodynamically significant mitral regurgitation. TEE demonstrated severe LV dysfunction with an LVEF of 20 to 25%, normal RV size with moderately reduced RV function, severe left atrial enlargement, and moderately severe mitral regurgitation with annular dilatation and restriction of the posterior leaflet. The mitral regurgitation is graded at 3+.   She was referred to the Structural Heart team and was seen by Dr. Burt Knack 08/08/22. She underwent successful transcatheter edge-to-edge mitral valve repair with MitraClip NTW x1 and NT x1 reducing baseline 4+ mitral regurgitation to trace, clips positioned A2/P2 on 10/04/22. Post op echocardiogram unfortunately showed moderate to severe MR felt secondary to SLDA of the second MitraClip placement. Mean gradient at 60mHg with an average HR at 83bpm.    [facility]. Says it comes "in waves."  Functional capacity now limited NYHA IIIB-IV. No CP. Compliant with meds.  In ER BNP  actually at its lowes level in months 1,220 (2,184 <-<- 4,500). CXR clear. Hstrop ok. [facility] Dr[facility] [place]   Review of Systems: [y] = yes, _0  = no   General: Weight gain _1 ; Weight loss _2 ; Anorexia _3 ; Fatigue [ y]; Fever _4 ; Chills _5 ; Weakness _6   Cardiac: Chest pain/pressure _7 ; Resting SOB [ y]; Exertional SOB [ y]; Orthopnea [Blue.Reese]; Pedal Edema [ y]; Palpitations _8 ; Syncope _9 ; Presyncope _10 ; Paroxysmal nocturnal dyspnea_11   Pulmonary: Cough _12 ; Wheezing_13 ; Hemoptysis_14 ; Sputum _15 ; Snoring _16   GI: Vomiting_17 ; Dysphagia_18 ; Melena_19 ; Hematochezia _20 ; Heartburn_21 ; Abdominal pain _22 ; Constipation _23 ; Diarrhea _24 ; BRBPR _25   GU: Hematuria_26 ; Dysuria _27 ; Nocturia_28   Vascular: Pain in legs with walking _29 ; Pain in feet with lying flat _30 ; Non-healing sores _31 ; Stroke _32 ; TIA _33 ; Slurred speech _34 ;  Neuro: Headaches_35 ; Vertigo_36 ; Seizures_37 ; Paresthesias_38 ;Blurred vision _39 ; Diplopia _40 ; Vision changes _41   Ortho/Skin: Arthritis [Blue.Reese]; Joint pain [Blue.Reese]; Muscle pain _42 ; Joint swelling _43 ; Back Pain _44 ; Rash _45   Psych: Depression_46 ; Anxiety_47   Heme: Bleeding problems _48 ; Clotting disorders _49 ; Anemia _50   Endocrine: Diabetes [ y]; Thyroid dysfunction_51   Home Medications Prior to Admission medications   Medication Sig Start Date End Date Taking? Authorizing Provider  atorvastatin (LIPITOR) 80 MG tablet Take 1 tablet by mouth once daily 11/20/21  Albie Bazin, Shaune Pascal, MD  carvedilol (COREG) 12.5 MG tablet TAKE 1 TABLET BY MOUTH TWICE DAILY WITH MEALS 06/25/22   Vickie Epley, MD  clopidogrel (PLAVIX) 75 MG tablet Take 1 tablet by mouth once daily 08/27/22   Leone Haven, MD  dorzolamide-timolol (COSOPT) 22.3-6.8 MG/ML ophthalmic solution Place 1 drop into both eyes 2 (two) times daily.  01/17/15   [provider]  furosemide (LASIX) 20 MG tablet Take 1 tablet (20 mg total) by mouth  daily. 10/10/22   Tommie Raymond, NP  isosorbide mononitrate (IMDUR) 30 MG 24 hr tablet Take 1 tablet by mouth once daily 09/05/22   Sherren Mocha, MD  latanoprost (XALATAN) 0.005 % ophthalmic solution Place 1 drop into both eyes at bedtime. 03/15/20   [provider]  lumateperone tosylate (CAPLYTA) 42 MG capsule Take 42 mg by mouth at bedtime.    [provider]  nitroGLYCERIN (NITROSTAT) 0.4 MG SL tablet DISSOLVE ONE TABLET UNDER THE TONGUE EVERY FIVE MINUTES AS NEEDED FOR CHEST PAIN. DO NOT EXCEED A TOTAL OF THREE DOSES IN 15 MINUTES 12/11/21   Sherren Mocha, MD  traZODone (DESYREL) 150 MG tablet Take 150 mg by mouth at bedtime. 10/23/21   [provider]  valsartan (DIOVAN) 80 MG tablet Take 80 mg by mouth daily.    [provider]  venlafaxine XR (EFFEXOR-XR) 150 MG 24 hr capsule TAKE 2 CAPSULES BY MOUTH ONCE DAILY (NEEDS to follow-up with psychiatry for further refills) 11/02/20   Leone Haven, MD    Past Medical History: Past Medical History:  Diagnosis Date   Anemia    Anxiety    Back pain    CAD (coronary artery disease) 04/11/2016   S/p ant STEMI 5/17: LHC >> LAD proximal 80%, mid 80%, distal 50%, ostial D1 60%; LCx with LPDA lesion 30%; RCA Mild calcification with no significant stenosis in a medium caliber, nondominant RCA; LVEF is estimated at 45% with inferoapical and lateral wall akinesis >> PCI: PCI: 3.5 x 24 mm Promus DES to prox LAD, 2.5 x 12 mm Promus DES to mid LAD. // Myoview 7/21: EF 20, no ischemia; high risk    Chest pain    Chronic systolic CHF (congestive heart failure) ([place]) 03/21/2016   Echo 01/30/17: Diff HK, mild focal basal septal hypertrophy, EF 30-35, mild AI, MAC, mild MR // Echo 06/08/16: Mild focal basal septal hypertrophy, EF 25-30%, diff HK, ant-septal AK, Gr 1 DD, mild AI, MAC, mild MR, PASP 37 mmHg // Echo 03/18/16: EF 30-35%, ant-septal AK, Gr 1 DD, mild MR, severe LAE.   Constipation    Depression    Diabetes  mellitus type 2 in obese (HCC)    Dizziness    Glaucoma    Gout    Heart disease    Heartburn    History of acute anterior wall MI 03/17/2016   History of heart attack    Hyperlipemia    Hypertension    Ischemic cardiomyopathy 10/02/2016   Refused ICD   Joint pain    Left bundle branch block    Myocardial infarction [facility])    Nausea    Positive colorectal cancer screening using Cologuard test 03/01/2020   S/P mitral valve clip implantation 10/04/2022   MitraClip NTWx1 and NTx1 with Dr. Burt Knack and Dr. Ali Lowe   Sleep apnea    SOB (shortness of breath)    Suicidal ideation 08/27/2018   Swelling    feet or legs    Past Surgical  History: Past Surgical History:  Procedure Laterality Date   APPENDECTOMY     BIV ICD INSERTION CRT-D N/A 01/02/2022   Procedure: BIV ICD INSERTION CRT-D;  Surgeon: Vickie Epley, MD;  Location: [place] [facility];  Service: Cardiovascular;  Laterality: N/A;   CARDIAC CATHETERIZATION N/A 03/17/2016   Procedure: Left Heart Cath and Coronary Angiography;  Surgeon: Sherren Mocha, MD;  Location: [place] [facility];  Service: Cardiovascular;  Laterality: N/A;   CARDIAC CATHETERIZATION N/A 03/17/2016   Procedure: Coronary Stent Intervention;  Surgeon: Sherren Mocha, MD;  Location: [place] [facility];  Service: Cardiovascular;  Laterality: N/A;   COLONOSCOPY WITH PROPOFOL N/A 08/13/2017   Procedure: COLONOSCOPY WITH PROPOFOL;  Surgeon: Jonathon Bellows, MD;  Location: [facility] ENDOSCOPY;  Service: Gastroenterology;  Laterality: N/A;   COLONOSCOPY WITH PROPOFOL N/A 03/29/2020   Procedure: COLONOSCOPY WITH PROPOFOL;  Surgeon: Lucilla Lame, MD;  Location: [facility] ENDOSCOPY;  Service: Endoscopy;  Laterality: N/A;   MITRAL VALVE REPAIR N/A 10/04/2022   Procedure: MITRAL VALVE REPAIR;  Surgeon: Sherren Mocha, MD;  Location: [place] [facility];  Service: Cardiovascular;  Laterality: N/A;   RIGHT/LEFT HEART CATH AND CORONARY ANGIOGRAPHY N/A 08/02/2022   Procedure: RIGHT/LEFT  HEART CATH AND CORONARY ANGIOGRAPHY;  Surgeon: Jolaine Artist, MD;  Location: [place] [facility];  Service: Cardiovascular;  Laterality: N/A;   SMALL BOWEL REPAIR     TEE WITHOUT CARDIOVERSION N/A 08/02/2022   Procedure: TRANSESOPHAGEAL ECHOCARDIOGRAM (TEE);  Surgeon: Jolaine Artist, MD;  Location: [facility] ENDOSCOPY;  Service: Cardiovascular;  Laterality: N/A;   TEE WITHOUT CARDIOVERSION N/A 10/04/2022   Procedure: TRANSESOPHAGEAL ECHOCARDIOGRAM (TEE);  Surgeon: Sherren Mocha, MD;  Location: [place] [facility];  Service: Cardiovascular;  Laterality: N/A;   TONSILLECTOMY     UTERINE FIBROID SURGERY      Family History: Family History  Problem Relation Age of Onset   Anxiety disorder Mother    Paranoid behavior Mother    Hypertension Mother    Dementia Mother    High Cholesterol Mother    Diabetes Mother    Hyperlipidemia Mother    Depression Mother    Hypertension Father    High Cholesterol Father    Mood Disorder Sister    Stroke Sister    Anxiety disorder Maternal Aunt    Tuberculosis Paternal Grandfather    Drug abuse Cousin     Social History: Social History   Socioeconomic History   Marital status: Married    Spouse name: Jimena Wieczorek   Number of children: 0   Years of education: BS in education   Highest education level: Not on file  Occupational History   Occupation:  Retired Education officer, museum  Tobacco Use   Smoking status: Former    Types: Cigarettes    Quit date: 04/13/1997    Years since quitting: 25.5   Smokeless tobacco: Never  Vaping Use   Vaping Use: Never used  Substance and Sexual Activity   Alcohol use: Not Currently   Drug use: No   Sexual activity: Not Currently  Other Topics Concern   Not on file  Social History Narrative   Lives in [place] with spouse.  No children.   Retired first [employer] for over 30 years ([place] for 10 years and then in [place] for over 20 years).   Left-handed   Lives in a two story home        Social Determinants of Health   Financial Resource Strain: Low Risk  (03/30/2022)  Overall Financial Resource Strain (CARDIA)    Difficulty of Paying Living Expenses: Not hard at all  Food Insecurity: No Food Insecurity (10/04/2022)   Hunger Vital Sign    Worried About Running Out of Food in the Last Year: Never true    Ran Out of Food in the Last Year: Never true  Transportation Needs: No Transportation Needs (10/04/2022)   PRAPARE - Hydrologist (Medical): No    Lack of Transportation (Non-Medical): No  Physical Activity: Not on file  Stress: No Stress Concern Present (03/30/2022)   [place]    Feeling of Stress : Not at all  Social Connections: Unknown (03/30/2022)   Social Connection and Isolation Panel [NHANES]    Frequency of Communication with Friends and Family: Not on file    Frequency of Social Gatherings with Friends and Family: Not on file    Attends Religious Services: Not on file    Active Member of Clubs or Organizations: Not on file    Attends Archivist Meetings: Not on file    Marital Status: Married    Allergies:  Allergies  Allergen Reactions   Tetracycline Swelling   Meperidine Nausea And Vomiting    (Demerol)Nausea   Entresto [Sacubitril-Valsartan] Other (See Comments)    Shortness of Breath    Objective:    Vital Signs:   BP: (157)/(67) 157/67 (12/10 1733) Weight:  [58 kg] 58 kg (12/10 1733) Last BM Date : 10/13/22 Filed Weights   10/14/22 1733  Weight: 58 kg    Physical Exam: General:  Sitting up in bed . No resp difficulty HEENT: normal Neck: supple. JVP 5-6 . Carotids 2+ bilat; no bruits. No lymphadenopathy or thryomegaly appreciated. Cor: PMI nondisplaced. Regular rate & rhythm. 2/6 MR Lungs: clear Abdomen: soft, nontender, nondistended. No hepatosplenomegaly. No bruits or masses. Good bowel sounds. Extremities: no cyanosis,  clubbing, rash, edema Neuro: alert & orientedx3, cranial nerves grossly intact. moves all 4 extremities w/o difficulty. Affect pleasant  Labs: Basic Metabolic Panel: Recent Labs  Lab 10/10/22 0923 10/14/22 1025  NA 143 139  K 4.6 3.5  CL 108* 109  CO2 23 24  GLUCOSE 121* 102*  BUN 21 22  CREATININE 1.03* 0.94  CALCIUM 9.6 9.4    Liver Function Tests: No results for input(s): "AST", "ALT", "ALKPHOS", "BILITOT", "PROT", "ALBUMIN" in the last 168 hours. No results for input(s): "LIPASE", "AMYLASE" in the last 168 hours. No results for input(s): "AMMONIA" in the last 168 hours.  CBC: Recent Labs  Lab 10/14/22 1025  WBC 6.0  NEUTROABS 4.1  HGB 10.5*  HCT 34.1*  MCV 87.9  PLT 208    Cardiac Enzymes: No results for input(s): "CKTOTAL", "CKMB", "CKMBINDEX", "TROPONINI" in the last 168 hours.  BNP: BNP (last 3 results) Recent Labs    05/07/22 1853 07/17/22 1458 10/14/22 1130  BNP 2,597.3* 2,184.7* 1,220.2*    ProBNP (last 3 results) Recent Labs    12/19/21 1107 10/01/22 1220 10/10/22 0923  PROBNP 2,342.0* 13,775* 9,776*     CBG: No results for input(s): "GLUCAP" in the last 168 hours.  Coagulation Studies: No results for input(s): "LABPROT", "INR" in the last 72 hours.  Other results: EKG: AV paced 73 Personally reviewed   Imaging: DG Chest 2 View  Result Date: 10/14/2022 CLINICAL DATA:  Shortness of breath on exertion. EXAM: CHEST - 2 VIEW COMPARISON:  10/04/2022 FINDINGS: Left-sided pacemaker unchanged. Lungs are adequately inflated  without focal airspace consolidation or effusion. Mild stable cardiomegaly. Remainder the exam is unchanged. IMPRESSION: 1. No active cardiopulmonary disease. 2. Mild stable cardiomegaly. Electronically Signed   By: Marin Olp M.D.   On: 10/14/2022 10:53       Assessment/Plan:   1. Acute on chronic systolic HF - initially felt due to ischemic CM. S/p Anterior STEMI 5/17  - Echo (8/19): EF 30-35%. - cMRI 1/23  EF 15% No LGE. RV ok. Severe LV dyssynchrony Moderate MR  - CRT-D placed 01/02/22 with LBBB lead by Dr. Quentin Ore -  Echo 07/17/22 EF 20-25% G2DD, RV ok. Severe central MR  - s/p mTEER with 2 clips on 10/04/22 - Post op echocardiogram 10/05/22. EF 25% with moderate to severe MR felt secondary to SLDA of the second MitraClip placement. Mean gradient at 76mHg with an average HR at 83bpm. RV normal - Now with recurrent NYHA IIIb-IV HF symptoms however CXR clear and BNP at recent nadir - Suspect MR/MS is major factor. Will plan RHC tomorrow.   2. Mitral regurgitation, severe - s/p mTEER with 2 clips on 10/04/22 - Post op echo EF 25% with moderate to severe MR felt secondary to SLDA of the second MitraClip placement. Mean gradient at 971mg with an average HR at 83bpm. RV normal - Now with recurrent NYHA IIIb-IV HF symptoms however CXR clear and BNP at recent nadir - Suspect MR/MS is major factor. Will plan RHC tomorrow.  - Will likely need to consider if she is candidate for high risk MVR  3. Coronary artery disease involving native coronary artery of native heart without angina pectoris - Hx of anterior STEMI in 5/17 tx with DES x 2 to the LAD.   - Myoview (7/21)  EF 20% no ischemia.   - No LGE on cMRI (1/23) - LHC 9/23 patent LAD stents - No s/s angina - Lipid management per PCP. Goal LDL < 70 - Hold Plavix for possible surgery   4. Essential hypertension - stable   5. LBBB - s/p CRT-D 3/23 with Dr. LaQuentin Ore. EF has not improved      Length of Stay: 0   [name] 10/14/2022, 6:25 PM  Advanced Heart Failure Team Pager [phone]-F; [place] Please contact [facility] for night-coverage after hours (4p -7a ) and weekends on [email or web address]

## 2022-10-14 NOTE — Plan of Care (Signed)
  Problem: Pain Managment: Goal: General experience of comfort will improve Outcome: Progressing   Problem: Safety: Goal: Ability to remain free from injury will improve Outcome: Progressing Note: Discussed notifying staff member if feeling dizzy and to have staff with her while ambulating   Problem: Education: Goal: Individualized Educational Video(s) Outcome: Progressing Note: Watched cardiac Cath, PCI and Stents on TV. Pt stated she learned a lot and denied having questions at this time.    Problem: Health Behavior/Discharge Planning: Goal: Ability to safely manage health-related needs after discharge will improve Outcome: Progressing

## 2022-10-14 NOTE — ED Notes (Signed)
Crackers and ginger ale given after md approval.  Pt requests to be placed on a pure wick, states that she was given lasix and doesn't want to keep getting up to the bathroom, pure wick placed, pt voided aprox 400 ml of clear dilute urine out.

## 2022-10-14 NOTE — ED Notes (Signed)
[facility] at bedside, report to care link, pt from dpt via care link, pt states that she is ready to transfer.

## 2022-10-14 NOTE — ED Notes (Signed)
Report to Melanie Ray at [facility], pt awaits [facility]

## 2022-10-14 NOTE — ED Notes (Signed)
Pt in bed, pt denies pain, pt states that her sob is a little bit better. Pt awaits [facility] to [facility].

## 2022-10-14 NOTE — ED Notes (Signed)
Dr. Joni Fears notified and Charge RN notified. As per this time, pt not to be dressed out as per provider.

## 2022-10-14 NOTE — Consult Note (Addendum)
Cardiology Consultation   Patient ID: Melanie Ray MRN: [id number]; DOB: 11-Feb-1951  Admit date: 10/14/2022 Date of Consult: 10/14/2022  PCP:  Melanie Ray, [place] Providers Cardiologist:  Melanie Mocha, MD  Cardiology APP:  Melanie Shi, PA-C  Electrophysiologist:  Melanie Epley, MD  {   Patient Profile:   Melanie Ray is a 71 y.o. female with a hx of severe MR s/p TEER, chronic systolic HF and ICD in situ who is being seen 10/14/2022 for the evaluation of dyspnea at the request of Melanie Ray.  History of Present Illness:   [name] presented to the ER with severe shortness of breath.  I actually saw her in clinic 12/6 and she was doing fairly well with stable dyspnea with exertion. This has worsened and now she is having "waves" of severe dyspnea. No syncope or presyncope. No fevers/chills. Melanie Ray and Melanie Ray are involved in her care.   Past Medical History:  Diagnosis Date   Anemia    Anxiety    Back pain    CAD (coronary artery disease) 04/11/2016   S/p ant STEMI 5/17: LHC >> LAD proximal 80%, mid 80%, distal 50%, ostial D1 60%; LCx with LPDA lesion 30%; RCA Mild calcification with no significant stenosis in a medium caliber, nondominant RCA; LVEF is estimated at 45% with inferoapical and lateral wall akinesis >> PCI: PCI: 3.5 x 24 mm Promus DES to prox LAD, 2.5 x 12 mm Promus DES to mid LAD. // Myoview 7/21: EF 20, no ischemia; high risk    Chest pain    Chronic systolic CHF (congestive heart failure) ([place]) 03/21/2016   Echo 01/30/17: Diff HK, mild focal basal septal hypertrophy, EF 30-35, mild AI, MAC, mild MR // Echo 06/08/16: Mild focal basal septal hypertrophy, EF 25-30%, diff HK, ant-septal AK, Gr 1 DD, mild AI, MAC, mild MR, PASP 37 mmHg // Echo 03/18/16: EF 30-35%, ant-septal AK, Gr 1 DD, mild MR, severe LAE.   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 Burt Ray and Melanie. Ali Ray   Sleep apnea    SOB (shortness of breath)    Suicidal ideation 08/27/2018   Swelling    feet or legs    Past Surgical History:  Procedure Laterality Date   APPENDECTOMY     BIV ICD INSERTION CRT-D N/A 01/02/2022   Procedure: BIV ICD INSERTION CRT-D;  Surgeon: Melanie Epley, MD;  Location: [place] CV [facility];  Service: Cardiovascular;  Laterality: N/A;   CARDIAC CATHETERIZATION N/A 03/17/2016   Procedure: Left Heart Cath and Coronary Angiography;  Surgeon: Melanie Mocha, MD;  Location: [place] [facility];  Service: Cardiovascular;  Laterality: N/A;   CARDIAC CATHETERIZATION N/A 03/17/2016   Procedure: Coronary Stent Intervention;  Surgeon: Melanie Mocha, MD;  Location: [place] [facility];  Service:  Cardiovascular;  Laterality: N/A;   COLONOSCOPY WITH PROPOFOL N/A 08/13/2017   Procedure: COLONOSCOPY WITH PROPOFOL;  Surgeon: Jonathon Bellows, MD;  Location: [facility] ENDOSCOPY;  Service: Gastroenterology;  Laterality: N/A;   COLONOSCOPY WITH PROPOFOL N/A 03/29/2020   Procedure: COLONOSCOPY WITH PROPOFOL;  Surgeon: Lucilla Lame, MD;  Location: [facility] ENDOSCOPY;  Service: Endoscopy;  Laterality: N/A;   MITRAL VALVE REPAIR N/A 10/04/2022   Procedure: MITRAL VALVE REPAIR;  Surgeon: Melanie Mocha, MD;  Location: [place] [facility];  Service: Cardiovascular;  Laterality: N/A;   RIGHT/LEFT HEART CATH AND CORONARY ANGIOGRAPHY N/A 08/02/2022   Procedure: RIGHT/LEFT HEART CATH AND CORONARY ANGIOGRAPHY;  Surgeon: Jolaine Artist, MD;  Location:  [place] [facility];  Service: Cardiovascular;  Laterality: N/A;   SMALL BOWEL REPAIR     TEE WITHOUT CARDIOVERSION N/A 08/02/2022   Procedure: TRANSESOPHAGEAL ECHOCARDIOGRAM (TEE);  Surgeon: Jolaine Artist, MD;  Location: [facility]COPY;  Service: Cardiovascular;  Laterality: N/A;   TEE WITHOUT CARDIOVERSION N/A 10/04/2022   Procedure: TRANSESOPHAGEAL ECHOCARDIOGRAM (TEE);  Surgeon: Melanie Mocha, MD;  Location: [place] [facility] LAB;  Service: Cardiovascular;  Laterality: N/A;   TONSILLECTOMY     UTERINE FIBROID SURGERY         Inpatient Medications: Scheduled Meds:  Continuous Infusions:  PRN Meds:   Allergies:    Allergies  Allergen Reactions   Tetracycline Swelling   Meperidine Nausea And Vomiting    (Demerol)Nausea   Entresto [Sacubitril-Valsartan] Other (See Comments)    Shortness of Breath    Social History:   Social History   Socioeconomic History   Marital status: Married    Spouse name: Melanie Ray   Number of children: 0   Years of education: BS in education   Highest education level: Not on file  Occupational History   Occupation:  Retired Education officer, museum  Tobacco Use   Smoking status: Former    Types: Cigarettes    Quit date: 04/13/1997    Years since quitting: 25.5   Smokeless tobacco: Never  Vaping Use   Vaping Use: Never used  Substance and Sexual Activity   Alcohol use: Not Currently   Drug use: No   Sexual activity: Not Currently  Other Topics Concern   Not on file  Social History Narrative   Lives in [place] with spouse.  No children.   Retired first [employer] for over 30 years ([employer] for 10 years and then in [employer] for over 20 years).   Left-handed   Lives in a two story home       Social Determinants of Health   Financial Resource Strain: Low Risk  (03/30/2022)   Overall Financial Resource Strain (CARDIA)    Difficulty of Paying Living Expenses: Not hard at all  Food Insecurity: No Food Insecurity (10/04/2022)    Hunger Vital Sign    Worried About Running Out of Food in the Last Year: Never true    Ran Out of Food in the Last Year: Never true  Transportation Needs: No Transportation Needs (10/04/2022)   PRAPARE - Hydrologist (Medical): No    Lack of Transportation (Non-Medical): No  Physical Activity: Not on file  Stress: No Stress Concern Present (03/30/2022)   [place]    Feeling of Stress : Not at all  Social Connections: Unknown (03/30/2022)   Social Connection and Isolation Panel [NHANES]    Frequency of Communication with Friends and Family: Not  on file    Frequency of Social Gatherings with Friends and Family: Not on file    Attends Religious Services: Not on file    Active Member of Clubs or Organizations: Not on file    Attends Club or Organization Meetings: Not on file    Marital Status: Married  Intimate Partner Violence: Not At Risk (10/04/2022)   Humiliation, Afraid, Rape, and Kick questionnaire    Fear of Current or Ex-Partner: No    Emotionally Abused: No    Physically Abused: No    Sexually Abused: No    Family History:    Family History  Problem Relation Age of Onset   Anxiety disorder Mother    Paranoid behavior Mother    Hypertension Mother    Dementia Mother    High Cholesterol Mother    Diabetes Mother    Hyperlipidemia Mother    Depression Mother    Hypertension Father    High Cholesterol Father    Mood Disorder Sister    Stroke Sister    Anxiety disorder Maternal Aunt    Tuberculosis Paternal Grandfather    Drug abuse Cousin      ROS:  Please see the history of present illness.   All other ROS reviewed and negative.     Physical Exam/Data:   Vitals:   10/14/22 1023 10/14/22 1027 10/14/22 1149  BP: (!) 149/72    Pulse: 72    Resp: 19    Temp: 97.6 F (36.4 C)    TempSrc: Oral    SpO2: 100%  100%  Weight:  59 kg   Height:  _0  (1.6 m)    No  intake or output data in the 24 hours ending 10/14/22 1211    10/14/2022   10:27 AM 10/10/2022    2:07 PM 10/10/2022    8:54 AM  Last 3 Weights  Weight (lbs) 130 lb 135 lb 134 lb  Weight (kg) 58.968 kg 61.236 kg 60.782 kg     Body mass index is 23.03 kg/m.   General:  Well nourished, well developed, in mild distress 2/2 dyspnea HEENT: normal Neck: no JVD Vascular: No carotid bruits; Distal pulses 2+ bilaterally Cardiac:  normal S1, S2; RRR; III/VI systolic murmur. CRT well healed in Melanie prepectoral pocket. Lungs:  clear to auscultation bilaterally, no wheezing, rhonchi or rales  Abd: soft, nontender, no hepatomegaly  Ext: no edema Musculoskeletal:  No deformities, BUE and BLE strength normal and equal Skin: warm and dry  Neuro:  CNs 2-12 intact, no focal abnormalities noted Psych: tearful  EKG:  The EKG was personally reviewed and demonstrates:  AV pacing Telemetry:  Telemetry was personally reviewed and demonstrates:  AV pacing  Relevant CV Studies:  10/05/2022 Echo EF 25 RV normal Severe MR Mod TR  Laboratory Data:  High Sensitivity Troponin:   Recent Labs  Lab 10/14/22 1025  TROPONINIHS 80*     Chemistry Recent Labs  Lab 10/10/22 0923 10/14/22 1025  NA 143 139  K 4.6 3.5  CL 108* 109  CO2 23 24  GLUCOSE 121* 102*  BUN 21 22  CREATININE 1.03* 0.94  CALCIUM 9.6 9.4  GFRNONAA  --  >60  ANIONGAP  --  6    No results for input(s): "PROT", "ALBUMIN", "AST", "ALT", "ALKPHOS", "BILITOT" in the last 168 hours. Lipids No results for input(s): "CHOL", "TRIG", "HDL", "LABVLDL", "LDLCALC", "CHOLHDL" in the last 168 hours.  Hematology Recent Labs  Lab 10/14/22 1025  WBC 6.0  RBC 3.88  HGB 10.5*  HCT 34.1*  MCV 87.9  MCH 27.1  MCHC 30.8  RDW 14.6  PLT 208   Thyroid No results for input(s): "TSH", "FREET4" in the last 168 hours.  BNP Recent Labs  Lab 10/10/22 0923 10/14/22 1130  BNP  --  1,220.2*  PROBNP 9,776*  --     DDimer No results for  input(s): "DDIMER" in the last 168 hours.   Radiology/Studies:  DG Chest 2 View  Result Date: 10/14/2022 CLINICAL DATA:  Shortness of breath on exertion. EXAM: CHEST - 2 VIEW COMPARISON:  10/04/2022 FINDINGS: Left-sided pacemaker unchanged. Lungs are adequately inflated without focal airspace consolidation or effusion. Mild stable cardiomegaly. Remainder the exam is unchanged. IMPRESSION: 1. No active cardiopulmonary disease. 2. Mild stable cardiomegaly. Electronically Signed   By: Marin Olp M.D.   On: 10/14/2022 10:53     Assessment and Plan:   Ms Ray is a pleasant 71yo woman with chronic systolci heart failure and severe MR s/p TEER who presents with dyspnea. She will require inpatient admission, probable RHC with consideration of high risk surgical MV replacement vs palliative discussions.  #Chronic systolic heart failure #LBBB NYHA III symptoms. Not significantly volume overloaded on exam, CXR. S/p ICD with left bundle area lead. QRS is narrow while paced (~115m). EF 25% on recent echo with mod-severe MR. Continue GDMT with coreg, imdur, valsartan. Give IV lasix 237monce today, then start oral lasix tomorrow  Keep NPO after MN in case we are able to add on for RHC tomorrow.  #ICD in situ Device functioning appropriately. Continue remote monitoring.      For questions or updates, please contact [place] [place] consult [email or web address] for contact info under    Signed, CAVickie EpleyMD  10/14/2022 12:11 PM   ------------------------------------  ADDENDUM 12:58 PM  I discussed the case with Melanie BeHaroldine Lawsrom [facility] further evaluation for possible MV intervention. ER attending notified.  CaLysbeth Galas. LaQuentin OreMD, FARehoboth Mckinley Christian Health Care ServicesFHBaptist [facility] Center - Fort Smithardiac Electrophysiology

## 2022-10-14 NOTE — ED Triage Notes (Addendum)
First Nurse.  Pt from home, complaining of shortness of breath on exertion. EMS reports it comes in waves. Pt has a pacemaker and valve surgery. Hx of MI, CHF and CVA. Pt reports she took an albuterol treatment prior to EMS  EMS Vitals: 97% RA 75 HR-paced 157/68  Pt states intermittent thoughts of suicide, but states "I do not have the luxury to think about killing myself." Pt denies a plan. Pt states "I just wish I could die, but I wont kill myself."

## 2022-10-15 ENCOUNTER — Encounter (HOSPITAL_COMMUNITY)
Admission: AD | Disposition: A | Discharge status: Home / Self Care | Payer: Self-pay | Source: Home / Self Care | Attending: Internal Medicine

## 2022-10-15 ENCOUNTER — Ambulatory Visit
Admission: RE | Admit: 2022-10-15 | Discharge: 2022-10-15 | Disposition: A | Discharge status: Home / Self Care | Payer: Medicare Other | Source: Ambulatory Visit | Attending: Neurology | Admitting: Neurology

## 2022-10-15 ENCOUNTER — Encounter (HOSPITAL_COMMUNITY): Payer: Self-pay | Admitting: Cardiovascular Disease

## 2022-10-15 DIAGNOSIS — I5043 Acute on chronic combined systolic (congestive) and diastolic (congestive) heart failure: Secondary | ICD-10-CM

## 2022-10-15 DIAGNOSIS — I340 Nonrheumatic mitral (valve) insufficiency: Secondary | ICD-10-CM

## 2022-10-15 HISTORY — PX: RIGHT HEART CATH: CATH118263

## 2022-10-15 LAB — POCT I-STAT EG7
Acid-Base Excess: 1 mmol/L (ref 0.0–2.0)
Acid-Base Excess: 0 mmol/L (ref 0.0–2.0)
Bicarbonate: 25.8 mmol/L (ref 20.0–28.0)
Bicarbonate: 25.4 mmol/L (ref 20.0–28.0)
Calcium, Ion: 1.32 mmol/L (ref 1.15–1.40)
Calcium, Ion: 1.31 mmol/L (ref 1.15–1.40)
HCT: 30 % — ABNORMAL LOW (ref 36.0–46.0)
HCT: 30 % — ABNORMAL LOW (ref 36.0–46.0)
Hemoglobin: 10.2 g/dL — ABNORMAL LOW (ref 12.0–15.0)
Hemoglobin: 10.2 g/dL — ABNORMAL LOW (ref 12.0–15.0)
O2 Saturation: 64 %
O2 Saturation: 64 %
Potassium: 3.9 mmol/L (ref 3.5–5.1)
Potassium: 3.9 mmol/L (ref 3.5–5.1)
Sodium: 142 mmol/L (ref 135–145)
Sodium: 142 mmol/L (ref 135–145)
TCO2: 27 mmol/L (ref 22–32)
TCO2: 27 mmol/L (ref 22–32)
pCO2, Ven: 43.1 mmHg — ABNORMAL LOW (ref 44–60)
pCO2, Ven: 41.5 mmHg — ABNORMAL LOW (ref 44–60)
pH, Ven: 7.384 (ref 7.25–7.43)
pH, Ven: 7.394 (ref 7.25–7.43)
pO2, Ven: 34 mmHg (ref 32–45)
pO2, Ven: 34 mmHg (ref 32–45)

## 2022-10-15 LAB — BASIC METABOLIC PANEL
Anion gap: 8 (ref 5–15)
BUN: 19 mg/dL (ref 8–23)
CO2: 26 mmol/L (ref 22–32)
Calcium: 9 mg/dL (ref 8.9–10.3)
Chloride: 108 mmol/L (ref 98–111)
Creatinine, Ser: 1.16 mg/dL — ABNORMAL HIGH (ref 0.44–1.00)
GFR, Estimated: 50 mL/min — ABNORMAL LOW (ref >60)
Glucose, Bld: 96 mg/dL (ref 70–99)
Potassium: 3.6 mmol/L (ref 3.5–5.1)
Sodium: 142 mmol/L (ref 135–145)

## 2022-10-15 LAB — GLUCOSE, CAPILLARY
Glucose-Capillary: 108 mg/dL — ABNORMAL HIGH (ref 70–99)
Glucose-Capillary: 105 mg/dL — ABNORMAL HIGH (ref 70–99)
Glucose-Capillary: 113 mg/dL — ABNORMAL HIGH (ref 70–99)
Glucose-Capillary: 109 mg/dL — ABNORMAL HIGH (ref 70–99)

## 2022-10-15 SURGERY — RIGHT HEART CATH

## 2022-10-15 MED ORDER — MIDAZOLAM HCL 2 MG/2ML IJ SOLN
INTRAMUSCULAR | Status: DC | PRN
  Administered 2022-10-15 (×2): 1 mg via INTRAVENOUS

## 2022-10-15 MED ORDER — LIDOCAINE HCL (PF) 1 % IJ SOLN
INTRAMUSCULAR | Status: AC
  Filled 2022-10-15: qty 30

## 2022-10-15 MED ORDER — HEPARIN (PORCINE) IN NACL 1000-0.9 UT/500ML-% IV SOLN
INTRAVENOUS | Status: DC | PRN
  Administered 2022-10-15 (×2): 500 mL

## 2022-10-15 MED ORDER — ASPIRIN 81 MG PO TBEC
81.0000 mg | DELAYED_RELEASE_TABLET | Freq: Every day | ORAL | Status: DC
  Administered 2022-10-15 – 2022-10-16 (×2): 81 mg via ORAL
  Filled 2022-10-15 (×3): qty 1

## 2022-10-15 MED ORDER — CHLORHEXIDINE GLUCONATE CLOTH 2 % EX PADS
6.0000 | MEDICATED_PAD | Freq: Every day | CUTANEOUS | Status: DC
  Administered 2022-10-15 – 2022-10-16 (×2): 6 via TOPICAL

## 2022-10-15 MED ORDER — LIDOCAINE HCL (PF) 1 % IJ SOLN
INTRAMUSCULAR | Status: DC | PRN
  Administered 2022-10-15: 2 mL

## 2022-10-15 MED ORDER — MIDAZOLAM HCL 2 MG/2ML IJ SOLN
INTRAMUSCULAR | Status: AC
  Filled 2022-10-15: qty 2

## 2022-10-15 MED ORDER — FENTANYL CITRATE (PF) 100 MCG/2ML IJ SOLN
INTRAMUSCULAR | Status: AC
  Filled 2022-10-15: qty 2

## 2022-10-15 MED ORDER — FENTANYL CITRATE (PF) 100 MCG/2ML IJ SOLN
INTRAMUSCULAR | Status: DC | PRN
  Administered 2022-10-15 (×2): 25 ug via INTRAVENOUS

## 2022-10-15 SURGICAL SUPPLY — 9 items
CATH SWAN GANZ 7F STRAIGHT (CATHETERS) IMPLANT
GLIDESHEATH SLENDER 7FR .021G (SHEATH) IMPLANT
GUIDEWIRE .025 260CM (WIRE) IMPLANT
KIT MICROPUNCTURE NIT STIFF (SHEATH) IMPLANT
PACK CARDIAC CATHETERIZATION (CUSTOM PROCEDURE TRAY) IMPLANT
SHEATH PINNACLE 7F 10CM (SHEATH) IMPLANT
TRANSDUCER W/MONITORING KIT (MISCELLANEOUS) IMPLANT
TUBING ART PRESS 72  MALE/FEM (TUBING) ×1
TUBING ART PRESS 72 MALE/FEM (TUBING) IMPLANT

## 2022-10-15 NOTE — TOC Initial Note (Signed)
Transition of Care [facility]) - Initial/Assessment Note    Patient Details  Name: Melanie Ray MRN: [id number] Date of Birth: Sep 05, 1951  [facility] CM/SW Contact:    Erenest Rasher, RN Phone Number: [phone] 10/15/2022, 2:33 PM  Clinical Narrative:                  HF TOC CM spoke to pt at bedside. States she was independent prior to hospital stay. Husband assist as needed. Pt reports having scale at home to do daily weights.  Will continue to follow for dc needs.    Expected Discharge Plan: Home/Self Care Barriers to Discharge: Continued Medical Work up   Patient Goals and CMS Choice Patient states their goals for this hospitalization and ongoing recovery are:: wants to remain independent      Expected Discharge Plan and Services Expected Discharge Plan: Home/Self Care   Discharge Planning Services: CM Consult   Living arrangements for the past 2 months: Single Family Home                                      Prior Living Arrangements/Services Living arrangements for the past 2 months: Single Family Home Lives with:: Spouse Patient language and need for interpreter reviewed:: Yes Do you feel safe going back to the place where you live?: Yes      Need for Family Participation in Patient Care: No (Comment) Care giver support system in place?: Yes (comment)   Criminal Activity/Legal Involvement Pertinent to Current Situation/Hospitalization: No - Comment as needed  Activities of Daily Living Home Assistive Devices/Equipment: Eyeglasses ADL Screening (condition at time of admission) Patient's cognitive ability adequate to safely complete daily activities?: Yes Is the patient deaf or have difficulty hearing?: No Does the patient have difficulty seeing, even when wearing glasses/contacts?: No Does the patient have difficulty concentrating, remembering, or making decisions?: No Patient able to express need for assistance with ADLs?:  Yes Does the patient have difficulty dressing or bathing?: No Independently performs ADLs?: Yes (appropriate for developmental age) Does the patient have difficulty walking or climbing stairs?: No Weakness of Legs: None Weakness of Arms/Hands: None  Permission Sought/Granted Permission sought to share information with : Case Manager, Family Supports, PCP Permission granted to share information with : Yes, Verbal Permission Granted  Share Information with NAME: Maylon Cos     Permission granted to share info w Relationship: friend  Permission granted to share info w Contact Information: [phone]  Emotional Assessment Appearance:: Appears stated age Attitude/Demeanor/Rapport: Engaged Affect (typically observed): Accepting Orientation: : Oriented to Self, Oriented to Place, Oriented to  Time, Oriented to Situation   Psych Involvement: No (comment)  Admission diagnosis:  Acute on chronic systolic CHF (congestive heart failure) (HCC) [I50.23] Patient Active Problem List   Diagnosis Date Noted   Severe mitral regurgitation 10/14/2022   Acute on chronic systolic CHF (congestive heart failure) ([place]) 10/14/2022   Acute on chronic combined systolic and diastolic CHF (congestive heart failure) ([place]) 10/05/2022   S/P mitral valve clip implantation 10/04/2022   Severe mitral insufficiency 10/04/2022   Allergic rhinitis 09/25/2022   COVID-19 06/27/2022   ICD (implantable cardioverter-defibrillator) in place 01/02/2022   Finger fracture 12/19/2021   Anemia, unspecified 11/02/2021   Memory difficulty 06/28/2020   Cerebellar stroke, acute ([place]) 03/30/2020   Nightmares 03/01/2020   Left bundle branch block 12/03/2018   QT prolongation  12/03/2018   Obsessive compulsive disorder 08/27/2018   Rash 02/07/2018   Hot flash, menopausal 02/07/2018   Vitamin D deficiency 06/17/2017   Class 1 obesity without serious comorbidity with body mass index (BMI) of 30.0 to 30.9 in adult 06/17/2017    Dizziness 01/03/2017   Ischemic cardiomyopathy 10/02/2016   CAD (coronary artery disease) 94/80/1655   Chronic systolic CHF (congestive heart failure) ([place]) 03/21/2016   History of acute anterior wall MI 03/18/2016   DM type 2 (diabetes mellitus, type 2) ([place]) 08/03/2015   Essential hypertension 07/28/2015   HLD (hyperlipidemia) 07/28/2015   Glaucoma 07/28/2015   Depression 07/28/2015   Anxiety 07/28/2015   Insomnia 07/28/2015   Overweight (BMI 25.0-29.9) 07/28/2015   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

## 2022-10-15 NOTE — Progress Notes (Signed)
Heart Failure Navigator Progress Note  Assessed for Heart & Vascular TOC clinic readiness.  Patient does not meet criteria due to Advanced Heart Failure patient of Dr. Bensimhon.   Navigator will sign off at this time.    Rayce Brahmbhatt, BSN, RN Heart Failure Nurse Navigator Secure Chat Only   

## 2022-10-15 NOTE — Progress Notes (Signed)
Upon assessing patient I asked her how she was doing this morning she said "not very good." I asked her to share more about why she wasn't doing well and she said "I just want to be dead. Life isn't enjoyable any more." I asked her if she has ever tried to do anything to harm herself or take her life and she said "I have three times but I couldn't go through with it." I asked her if she has a plan or has had thoughts of presently harming herself and she said "No, I want to but I have a husband with dementia and a dog with diabetes. They need me too much to do anything." Patient stated that she hasn't been taking any of her psych medications due to feeling like they do not work anymore. Patient is very teary and states that she is depressed. Patient called psychiatrist and set up an appointment for December 18th at 1pm. She stated to the nurse on the phone "I feel anxious and depressed and like my meds aren't working."

## 2022-10-15 NOTE — H&P (View-Only) (Signed)
Advanced Heart Failure Rounding Note   Subjective:    Diuresed with IV lasix. Still SOB at times. Renal function stable.  No orthopnea or PND.   For Melanie Ray today  Objective:   Weight Range:  Vital Signs:   Temp:  [97.8 F (36.6 C)-98.3 F (36.8 C)] 97.9 F (36.6 C) (12/11 0620) Pulse Rate:  [72-98] 79 (12/11 0700) Resp:  [13-29] 14 (12/11 0700) BP: (115-157)/(41-73) 147/72 (12/11 0700) SpO2:  [94 %-100 %] 97 % (12/11 0700) Weight:  [58 kg-58.2 kg] 58.2 kg (12/11 0500) Last BM Date : 10/14/22  Weight change: Filed Weights   10/14/22 1733 10/15/22 0500  Weight: 58 kg 58.2 kg    Intake/Output:   Intake/Output Summary (Last 24 hours) at 10/15/2022 0727 Last data filed at 10/15/2022 0700 Gross per 24 hour  Intake 410.52 ml  Output 250 ml  Net 160.52 ml     Physical Exam: General:  Well appearing. No resp difficulty HEENT: normal Neck: supple. JVP 6 . Carotids 2+ bilat; no bruits. No lymphadenopathy or thryomegaly appreciated. Cor: PMI nondisplaced. Regular rate & rhythm. 2/6 MR Lungs: clear Abdomen: soft, nontender, nondistended. No hepatosplenomegaly. No bruits or masses. Good bowel sounds. Extremities: no cyanosis, clubbing, rash, edema Neuro: alert & orientedx3, cranial nerves grossly intact. moves all 4 extremities w/o difficulty. Affect pleasant  Telemetry: Sinus 70-80s Personally reviewed  Labs: Basic Metabolic Panel: Recent Labs  Lab 10/10/22 0923 10/14/22 1025 10/14/22 1922 10/15/22 0432  NA 143 139  --  142  K 4.6 3.5  --  3.6  CL 108* 109  --  108  CO2 23 24  --  26  GLUCOSE 121* 102*  --  96  BUN 21 22  --  19  CREATININE 1.03* 0.94 1.15* 1.16*  CALCIUM 9.6 9.4  --  9.0    Liver Function Tests: No results for input(s): "AST", "ALT", "ALKPHOS", "BILITOT", "PROT", "ALBUMIN" in the last 168 hours. No results for input(s): "LIPASE", "AMYLASE" in the last 168 hours. No results for input(s): "AMMONIA" in the last 168 hours.  CBC: Recent  Labs  Lab 10/14/22 1025 10/14/22 1922  WBC 6.0 5.8  NEUTROABS 4.1  --   HGB 10.5* 10.4*  HCT 34.1* 32.3*  MCV 87.9 85.7  PLT 208 215    Cardiac Enzymes: No results for input(s): "CKTOTAL", "CKMB", "CKMBINDEX", "TROPONINI" in the last 168 hours.  BNP: BNP (last 3 results) Recent Labs    05/07/22 1853 07/17/22 1458 10/14/22 1130  BNP 2,597.3* 2,184.7* 1,220.2*    ProBNP (last 3 results) Recent Labs    12/19/21 1107 10/01/22 1220 10/10/22 0923  PROBNP 2,342.0* 13,775* 9,776*      Other results:  Imaging: DG Chest 2 View  Result Date: 10/14/2022 CLINICAL DATA:  Shortness of breath on exertion. EXAM: CHEST - 2 VIEW COMPARISON:  10/04/2022 FINDINGS: Left-sided pacemaker unchanged. Lungs are adequately inflated without focal airspace consolidation or effusion. Mild stable cardiomegaly. Remainder the exam is unchanged. IMPRESSION: 1. No active cardiopulmonary disease. 2. Mild stable cardiomegaly. Electronically Signed   By: Marin Olp M.D.   On: 10/14/2022 10:53     Medications:     Scheduled Medications:  atorvastatin  80 mg Oral Daily   carvedilol  6.25 mg Oral BID WC   Chlorhexidine Gluconate Cloth  6 each Topical Daily   dorzolamide-timolol  1 drop Both Eyes BID   enoxaparin (LOVENOX) injection  40 mg Subcutaneous Q24H   furosemide  20 mg Oral Daily  insulin aspart  0-15 Units Subcutaneous TID WC   irbesartan  75 mg Oral Daily   isosorbide mononitrate  30 mg Oral Daily   latanoprost  1 drop Both Eyes QHS   lumateperone tosylate  42 mg Oral QHS   sodium chloride flush  3 mL Intravenous Q12H   sodium chloride flush  3 mL Intravenous Q12H   traZODone  150 mg Oral QHS   venlafaxine XR  300 mg Oral Daily    Infusions:  sodium chloride     sodium chloride     sodium chloride 10 mL/hr at 10/15/22 0700    PRN Medications: sodium chloride, sodium chloride, acetaminophen, sodium chloride flush, sodium chloride flush   Assessment/Plan:   1. Acute on  chronic systolic HF - initially felt due to ischemic CM. S/p Anterior STEMI 5/17  - Echo (8/19): EF 30-35%. - cMRI 1/23 EF 15% No LGE. RV ok. Severe LV dyssynchrony Moderate MR  - CRT-D placed 01/02/22 with LBBB lead by Dr. Quentin Ore -  Echo 07/17/22 EF 20-25% G2DD, RV ok. Severe central MR  - s/p mTEER with 2 clips on 10/04/22 - Post op echocardiogram 10/05/22. EF 25% with moderate to severe MR felt secondary to SLDA of the second MitraClip placement. Mean gradient at 64mHg with an average HR at 83bpm. RV normal - Now with recurrent NYHA IIIb-IV HF symptoms however CXR clear and BNP at recent nadir - Suspect MR/MS is major factor. Volume status doesn't look too bad this am - For RHC later today. D/w Dr. CBurt Knack(Structural Team)   2. Mitral regurgitation, severe - s/p mTEER with 2 clips on 10/04/22 - Post op echo EF 25% with moderate to severe MR felt secondary to SLDA of the second MitraClip placement. Mean gradient at 951mg with an average HR at 83bpm. RV normal - Now with recurrent NYHA IIIb-IV HF symptoms however CXR clear and BNP at recent nadir - Suspect MR/MS is major factor. Will plan RHC today - Will likely need to consider if she is candidate for high risk MVR   3. Coronary artery disease involving native coronary artery of native heart without angina pectoris - Hx of anterior STEMI in 5/17 tx with DES x 2 to the LAD.   - Myoview (7/21)  EF 20% no ischemia.   - No LGE on cMRI (1/23) - LHC 9/23 patent LAD stents - No s/s angina - Lipid management per PCP. Goal LDL < 70 - Hold Plavix for possible surgery   4. Essential hypertension - BP slightly elevated today. Titrate meds as needed after cath,   5. LBBB - s/p CRT-D 3/23 with Dr. LaQuentin Ore. EF has not improved    6. Hypokalemia - supp  Length of Stay: 1   [name] 10/15/2022, 7:27 AM  Advanced Heart Failure Team Pager [phone]; 7aIrena Please contact [facility] for night-coverage after hours (4p  -7a ) and weekends on [email or web address]

## 2022-10-15 NOTE — Progress Notes (Signed)
Advanced Heart Failure Rounding Note   Subjective:    Diuresed with IV lasix. Still SOB at times. Renal function stable.  No orthopnea or PND.   For [place] today  Objective:   Weight Range:  Vital Signs:   Temp:  [97.8 F (36.6 C)-98.3 F (36.8 C)] 97.9 F (36.6 C) (12/11 0620) Pulse Rate:  [72-98] 79 (12/11 0700) Resp:  [13-29] 14 (12/11 0700) BP: (115-157)/(41-73) 147/72 (12/11 0700) SpO2:  [94 %-100 %] 97 % (12/11 0700) Weight:  [58 kg-58.2 kg] 58.2 kg (12/11 0500) Last BM Date : 10/14/22  Weight change: Filed Weights   10/14/22 1733 10/15/22 0500  Weight: 58 kg 58.2 kg    Intake/Output:   Intake/Output Summary (Last 24 hours) at 10/15/2022 0727 Last data filed at 10/15/2022 0700 Gross per 24 hour  Intake 410.52 ml  Output 250 ml  Net 160.52 ml     Physical Exam: General:  Well appearing. No resp difficulty HEENT: normal Neck: supple. JVP 6 . Carotids 2+ bilat; no bruits. No lymphadenopathy or thryomegaly appreciated. Cor: PMI nondisplaced. Regular rate & rhythm. 2/6 MR Lungs: clear Abdomen: soft, nontender, nondistended. No hepatosplenomegaly. No bruits or masses. Good bowel sounds. Extremities: no cyanosis, clubbing, rash, edema Neuro: alert & orientedx3, cranial nerves grossly intact. moves all 4 extremities w/o difficulty. Affect pleasant  Telemetry: Sinus 70-80s Personally reviewed  Labs: Basic Metabolic Panel: Recent Labs  Lab 10/10/22 0923 10/14/22 1025 10/14/22 1922 10/15/22 0432  NA 143 139  --  142  K 4.6 3.5  --  3.6  CL 108* 109  --  108  CO2 23 24  --  26  GLUCOSE 121* 102*  --  96  BUN 21 22  --  19  CREATININE 1.03* 0.94 1.15* 1.16*  CALCIUM 9.6 9.4  --  9.0    Liver Function Tests: No results for input(s): "AST", "ALT", "ALKPHOS", "BILITOT", "PROT", "ALBUMIN" in the last 168 hours. No results for input(s): "LIPASE", "AMYLASE" in the last 168 hours. No results for input(s): "AMMONIA" in the last 168 hours.  CBC: Recent  Labs  Lab 10/14/22 1025 10/14/22 1922  WBC 6.0 5.8  NEUTROABS 4.1  --   HGB 10.5* 10.4*  HCT 34.1* 32.3*  MCV 87.9 85.7  PLT 208 215    Cardiac Enzymes: No results for input(s): "CKTOTAL", "CKMB", "CKMBINDEX", "TROPONINI" in the last 168 hours.  BNP: BNP (last 3 results) Recent Labs    05/07/22 1853 07/17/22 1458 10/14/22 1130  BNP 2,597.3* 2,184.7* 1,220.2*    ProBNP (last 3 results) Recent Labs    12/19/21 1107 10/01/22 1220 10/10/22 0923  PROBNP 2,342.0* 13,775* 9,776*      Other results:  Imaging: DG Chest 2 View  Result Date: 10/14/2022 CLINICAL DATA:  Shortness of breath on exertion. EXAM: CHEST - 2 VIEW COMPARISON:  10/04/2022 FINDINGS: Left-sided pacemaker unchanged. Lungs are adequately inflated without focal airspace consolidation or effusion. Mild stable cardiomegaly. Remainder the exam is unchanged. IMPRESSION: 1. No active cardiopulmonary disease. 2. Mild stable cardiomegaly. Electronically Signed   By: Marin Olp M.D.   On: 10/14/2022 10:53     Medications:     Scheduled Medications:  atorvastatin  80 mg Oral Daily   carvedilol  6.25 mg Oral BID WC   Chlorhexidine Gluconate Cloth  6 each Topical Daily   dorzolamide-timolol  1 drop Both Eyes BID   enoxaparin (LOVENOX) injection  40 mg Subcutaneous Q24H   furosemide  20 mg Oral Daily  insulin aspart  0-15 Units Subcutaneous TID WC   irbesartan  75 mg Oral Daily   isosorbide mononitrate  30 mg Oral Daily   latanoprost  1 drop Both Eyes QHS   lumateperone tosylate  42 mg Oral QHS   sodium chloride flush  3 mL Intravenous Q12H   sodium chloride flush  3 mL Intravenous Q12H   traZODone  150 mg Oral QHS   venlafaxine XR  300 mg Oral Daily    Infusions:  sodium chloride     sodium chloride     sodium chloride 10 mL/hr at 10/15/22 0700    PRN Medications: sodium chloride, sodium chloride, acetaminophen, sodium chloride flush, sodium chloride flush   Assessment/Plan:   1. Acute on  chronic systolic HF - initially felt due to ischemic CM. S/p Anterior STEMI 5/17  - Echo (8/19): EF 30-35%. - cMRI 1/23 EF 15% No LGE. RV ok. Severe LV dyssynchrony Moderate MR  - CRT-D placed 01/02/22 with LBBB lead by Dr. Quentin Ore -  Echo 07/17/22 EF 20-25% G2DD, RV ok. Severe central MR  - s/p mTEER with 2 clips on 10/04/22 - Post op echocardiogram 10/05/22. EF 25% with moderate to severe MR felt secondary to SLDA of the second MitraClip placement. Mean gradient at 8mHg with an average HR at 83bpm. RV normal - Now with recurrent NYHA IIIb-IV HF symptoms however CXR clear and BNP at recent nadir - Suspect MR/MS is major factor. Volume status doesn't look too bad this am - For RHC later today. D/w Dr. CBurt Knack(Structural Team)   2. Mitral regurgitation, severe - s/p mTEER with 2 clips on 10/04/22 - Post op echo EF 25% with moderate to severe MR felt secondary to SLDA of the second MitraClip placement. Mean gradient at 961mg with an average HR at 83bpm. RV normal - Now with recurrent NYHA IIIb-IV HF symptoms however CXR clear and BNP at recent nadir - Suspect MR/MS is major factor. Will plan RHC today - Will likely need to consider if she is candidate for high risk MVR   3. Coronary artery disease involving native coronary artery of native heart without angina pectoris - Hx of anterior STEMI in 5/17 tx with DES x 2 to the LAD.   - Myoview (7/21)  EF 20% no ischemia.   - No LGE on cMRI (1/23) - LHC 9/23 patent LAD stents - No s/s angina - Lipid management per PCP. Goal LDL < 70 - Hold Plavix for possible surgery   4. Essential hypertension - BP slightly elevated today. Titrate meds as needed after cath,   5. LBBB - s/p CRT-D 3/23 with Dr. LaQuentin Ore. EF has not improved    6. Hypokalemia - supp  Length of Stay: 1   [name] 10/15/2022, 7:27 AM  Advanced Heart Failure Team Pager [phone]-F; [employer] Please contact [facility] for night-coverage after hours (4p  -7a ) and weekends on [email or web address]

## 2022-10-15 NOTE — Interval H&P Note (Signed)
History and Physical Interval Note:  10/15/2022 9:42 AM  Melanie Ray  has presented today for surgery, with the diagnosis of HF.  The various methods of treatment have been discussed with the patient and family. After consideration of risks, benefits and other options for treatment, the patient has consented to  Procedure(s): RIGHT HEART CATH (N/A) as a surgical intervention.  The patient's history has been reviewed, patient examined, no change in status, stable for surgery.  I have reviewed the patient's chart and labs.  Questions were answered to the patient's satisfaction.     Sherren Mocha

## 2022-10-16 ENCOUNTER — Other Ambulatory Visit (HOSPITAL_COMMUNITY): Payer: Self-pay

## 2022-10-16 ENCOUNTER — Inpatient Hospital Stay (HOSPITAL_COMMUNITY): Payer: Medicare Other

## 2022-10-16 ENCOUNTER — Other Ambulatory Visit: Payer: Self-pay | Admitting: *Deleted

## 2022-10-16 ENCOUNTER — Encounter (HOSPITAL_COMMUNITY): Payer: Self-pay | Admitting: Internal Medicine

## 2022-10-16 DIAGNOSIS — I340 Nonrheumatic mitral (valve) insufficiency: Secondary | ICD-10-CM

## 2022-10-16 DIAGNOSIS — Z9889 Other specified postprocedural states: Secondary | ICD-10-CM

## 2022-10-16 DIAGNOSIS — Z01810 Encounter for preprocedural cardiovascular examination: Secondary | ICD-10-CM

## 2022-10-16 DIAGNOSIS — I5023 Acute on chronic systolic (congestive) heart failure: Secondary | ICD-10-CM

## 2022-10-16 DIAGNOSIS — R0602 Shortness of breath: Secondary | ICD-10-CM

## 2022-10-16 LAB — BASIC METABOLIC PANEL
Anion gap: 7 (ref 5–15)
BUN: 17 mg/dL (ref 8–23)
CO2: 24 mmol/L (ref 22–32)
Calcium: 9.2 mg/dL (ref 8.9–10.3)
Chloride: 109 mmol/L (ref 98–111)
Creatinine, Ser: 0.76 mg/dL (ref 0.44–1.00)
GFR, Estimated: >60 mL/min (ref >60)
Glucose, Bld: 100 mg/dL — ABNORMAL HIGH (ref 70–99)
Potassium: 3.9 mmol/L (ref 3.5–5.1)
Sodium: 140 mmol/L (ref 135–145)

## 2022-10-16 LAB — GLUCOSE, CAPILLARY
Glucose-Capillary: 99 mg/dL (ref 70–99)
Glucose-Capillary: 97 mg/dL (ref 70–99)

## 2022-10-16 MED ORDER — ASPIRIN 81 MG PO TBEC
81.0000 mg | DELAYED_RELEASE_TABLET | Freq: Every day | ORAL | 12 refills | Status: AC
  Filled 2022-10-16: qty 30, 30d supply, fill #0

## 2022-10-16 MED ORDER — SPIRONOLACTONE 25 MG PO TABS
25.0000 mg | ORAL_TABLET | Freq: Every day | ORAL | 6 refills | Status: DC
  Filled 2022-10-16: qty 30, 30d supply, fill #0

## 2022-10-16 MED ORDER — CARVEDILOL 6.25 MG PO TABS
6.2500 mg | ORAL_TABLET | Freq: Two times a day (BID) | ORAL | 5 refills | Status: DC
  Filled 2022-10-16: qty 60, 30d supply, fill #0

## 2022-10-16 MED ORDER — SPIRONOLACTONE 12.5 MG HALF TABLET
12.5000 mg | ORAL_TABLET | Freq: Every day | ORAL | Status: DC
  Administered 2022-10-16: 12.5 mg via ORAL
  Filled 2022-10-16: qty 1

## 2022-10-16 NOTE — Consult Note (Signed)
Melanie BeachSuite [address]       [place] [address]             [phone]           Melanie Ray [place] Medical Record #[id number] Date of Birth: June 02, 1951  Melanie Mew, MD Melanie Haven, MD  Chief Complaint: increasing DOE following mitral clip    History of Present Illness:     71 yo female with ischemic cardiomyopathy following MI treated aggressively medically and with CRRT/defibrillator who was recently treated for severe MR with mitral clip 3 weeks ago. Procedurally excellent result however following day had loud murmur and recurrent MR. Pt now with CHF symptoms worse than prior to mitral clip with severe DOE with minimal exertion. No CP. Work up with echo now with EF 20-25% and severe MR with mean gradient of 28mHg. There is very good Right heart function. She has no evidence of SLD of clip but etiology may be secondary to injured chord/rupture. She had RHC yesterday which has surprisingly adequate hemodynamics considering her LV function. I have been asked to consider mv surgery      Past Medical History:  Diagnosis Date   Anemia    Anxiety    Back pain    CAD (coronary artery disease) 04/11/2016   S/p ant STEMI 5/17: LHC >> LAD proximal 80%, mid 80%, distal 50%, ostial D1 60%; LCx with LPDA lesion 30%; RCA Mild calcification with no significant stenosis in a medium caliber, nondominant RCA; LVEF is estimated at 45% with inferoapical and lateral wall akinesis >> PCI: PCI: 3.5 x 24 mm Promus DES to prox LAD, 2.5 x 12 mm Promus DES to mid LAD. // Myoview 7/21: EF 20, no ischemia; high risk    Chest pain    Chronic systolic CHF (congestive heart failure) [place] 03/21/2016   Echo 01/30/17: Diff HK, mild focal basal septal hypertrophy, EF 30-35, mild AI, MAC, mild MR // Echo 06/08/16: Mild focal basal septal hypertrophy, EF 25-30%, diff HK, ant-septal AK, Gr 1 DD, mild AI, MAC, mild MR, PASP 37 mmHg // Echo 03/18/16: EF 30-35%, ant-septal AK, Gr 1 DD,  mild MR, severe LAE.   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 CBurt Knackand Dr. TAli Lowe  Sleep apnea    SOB (shortness of breath)    Suicidal ideation 08/27/2018   Swelling    feet or legs    Past Surgical History:  Procedure Laterality Date   APPENDECTOMY     BIV ICD INSERTION CRT-D N/A 01/02/2022   Procedure: BIV ICD INSERTION CRT-D;  Surgeon: LVickie Epley MD;  [facility];  Service: Cardiovascular;  Laterality: N/A;   CARDIAC CATHETERIZATION N/A 03/17/2016   Procedure: Left Heart Cath and Coronary Angiography;  Surgeon: MSherren Mocha MD;  [facility];  Service: Cardiovascular;  Laterality: N/A;   CARDIAC CATHETERIZATION N/A 03/17/2016   Procedure: Coronary Stent Intervention;  Surgeon: Sherren Mocha, MD;  Location: [place] [place] [facility];  Service: Cardiovascular;  Laterality: N/A;   COLONOSCOPY WITH PROPOFOL N/A 08/13/2017   Procedure: COLONOSCOPY WITH PROPOFOL;  Surgeon: Jonathon Bellows, MD;  Location: [facility] ENDOSCOPY;  Service: Gastroenterology;  Laterality: N/A;   COLONOSCOPY WITH PROPOFOL N/A 03/29/2020   Procedure: COLONOSCOPY WITH PROPOFOL;  Surgeon: Lucilla Lame, MD;  Location: [facility] ENDOSCOPY;  Service: Endoscopy;  Laterality: N/A;   MITRAL VALVE REPAIR N/A 10/04/2022   Procedure: MITRAL VALVE REPAIR;  Surgeon: Sherren Mocha, MD;  Location: [place] [facility];  Service: Cardiovascular;  Laterality: N/A;   RIGHT HEART CATH N/A 10/15/2022   Procedure:  RIGHT HEART CATH;  Surgeon: Sherren Mocha, MD;  Location: [place] [facility];  Service: Cardiovascular;  Laterality: N/A;   RIGHT/LEFT HEART CATH AND CORONARY ANGIOGRAPHY N/A 08/02/2022   Procedure: RIGHT/LEFT HEART CATH AND CORONARY ANGIOGRAPHY;  Surgeon: Jolaine Artist, MD;  Location: [place] [facility];  Service: Cardiovascular;  Laterality: N/A;   SMALL BOWEL REPAIR     TEE WITHOUT CARDIOVERSION N/A 08/02/2022   Procedure: TRANSESOPHAGEAL ECHOCARDIOGRAM (TEE);  Surgeon: Jolaine Artist, MD;  Location: [facility] ENDOSCOPY;  Service: Cardiovascular;  Laterality: N/A;   TEE WITHOUT CARDIOVERSION N/A 10/04/2022   Procedure: TRANSESOPHAGEAL ECHOCARDIOGRAM (TEE);  Surgeon: Sherren Mocha, MD;  Location: [place] [facility];  Service: Cardiovascular;  Laterality: N/A;   TONSILLECTOMY     UTERINE FIBROID SURGERY      Social History   Tobacco Use  Smoking Status Former   Types: Cigarettes   Quit date: 04/13/1997   Years since quitting: 25.5  Smokeless Tobacco Never    Social History   Substance and Sexual Activity  Alcohol Use Not Currently    Social History   Socioeconomic History   Marital status: Married    Spouse name: Beckey Polkowski   Number of children: 0   Years of education: BS in education   Highest education level: Not on file  Occupational History   Occupation:  Retired Education officer, museum  Tobacco Use   Smoking status: Former    Types: Cigarettes    Quit date: 04/13/1997    Years since quitting: 25.5   Smokeless tobacco: Never  Vaping Use   Vaping Use: Never used  Substance and Sexual Activity   Alcohol use: Not Currently   Drug use: No   Sexual activity: Not Currently  Other Topics Concern   Not on file  Social History Narrative   Lives in [place] with spouse.  No children.   Retired first [employer] for over 30 years ([place] for 10 years and then in [place] for over 20 years).   Left-handed   Lives in a two story home       Social  Determinants of Health   Financial Resource Strain: Low Risk  (03/30/2022)   Overall Financial Resource Strain (CARDIA)    Difficulty of Paying Living Expenses: Not hard at all  Food Insecurity: No Food Insecurity (10/15/2022)   Hunger Vital Sign    Worried About Running Out of Food in the Last Year: Never true    Ran Out of Food in the Last Year: Never true  Transportation Needs: No Transportation Needs (10/15/2022)   PRAPARE - Hydrologist (Medical): No    Lack of Transportation (Non-Medical): No  Physical Activity: Not  on file  Stress: No Stress Concern Present (03/30/2022)   [place]    Feeling of Stress : Not at all  Social Connections: Unknown (03/30/2022)   Social Connection and Isolation Panel [NHANES]    Frequency of Communication with Friends and Family: Not on file    Frequency of Social Gatherings with Friends and Family: Not on file    Attends Religious Services: Not on file    Active Member of [place] or Organizations: Not on file    Attends Archivist Meetings: Not on file    Marital Status: Married  Intimate Partner Violence: Not At Risk (10/15/2022)   Humiliation, Afraid, Rape, and Kick questionnaire    Fear of Current or Ex-Partner: No    Emotionally Abused: No    Physically Abused: No    Sexually Abused: No    Allergies  Allergen Reactions   Tetracycline Swelling   Meperidine Nausea And Vomiting    (Demerol)Nausea   Entresto [Sacubitril-Valsartan] Other (See Comments)    Shortness of Breath    Current Facility-Administered Medications  Medication Dose Route Frequency Provider Last Rate Last Admin   0.9 %  sodium chloride infusion  250 mL Intravenous PRN Sherren Mocha, MD       acetaminophen (TYLENOL) tablet 650 mg  650 mg Oral Q4H PRN Sherren Mocha, MD       aspirin EC tablet 81 mg  81 mg Oral Daily Sherren Mocha, MD   81 mg at 10/15/22 1343    atorvastatin (LIPITOR) tablet 80 mg  80 mg Oral Daily Sherren Mocha, MD   80 mg at 10/15/22 0904   carvedilol (COREG) tablet 6.25 mg  6.25 mg Oral BID WC Sherren Mocha, MD   6.25 mg at 10/16/22 0459   Chlorhexidine Gluconate Cloth 2 % PADS 6 each  6 each Topical Daily Sherren Mocha, MD   6 each at 10/15/22 0905   dorzolamide-timolol (COSOPT) 2-0.5 % ophthalmic solution 1 drop  1 drop Both Eyes BID Sherren Mocha, MD   1 drop at 10/15/22 2202   enoxaparin (LOVENOX) injection 40 mg  40 mg Subcutaneous Q24H Sherren Mocha, MD   40 mg at 10/15/22 2024   furosemide (LASIX) tablet 20 mg  20 mg Oral Daily Sherren Mocha, MD   20 mg at 10/15/22 0904   insulin aspart (novoLOG) injection 0-15 Units  0-15 Units Subcutaneous TID WC Sherren Mocha, MD       irbesartan (AVAPRO) tablet 75 mg  75 mg Oral Daily Sherren Mocha, MD   75 mg at 10/15/22 0904   isosorbide mononitrate (IMDUR) 24 hr tablet 30 mg  30 mg Oral Daily Sherren Mocha, MD   30 mg at 10/15/22 0904   latanoprost (XALATAN) 0.005 % ophthalmic solution 1 drop  1 drop Both Eyes QHS Sherren Mocha, MD   1 drop at 10/15/22 2205   lumateperone tosylate (CAPLYTA) capsule 42 mg  42 mg Oral Melanie Meyer, MD   42 mg at 10/15/22 2203   sodium chloride flush (NS) 0.9 % injection 3 mL  3 mL Intravenous Janalee Dane, MD   3 mL at 10/15/22 0906   sodium chloride flush (NS) 0.9 % injection 3 mL  3 mL Intravenous PRN Sherren Mocha, MD       spironolactone (ALDACTONE) tablet 12.5 mg  12.5 mg Oral Daily Clegg, Amy D, NP       traZODone (DESYREL) tablet 150 mg  150 mg  Oral Melanie Meyer, MD   150 mg at 10/15/22 2203   venlafaxine XR (EFFEXOR-XR) 24 hr capsule 300 mg  300 mg Oral Daily Sherren Mocha, MD   300 mg at 10/15/22 0909     Family History  Problem Relation Age of Onset   Anxiety disorder Mother    Paranoid behavior Mother    Hypertension Mother    Dementia Mother    High Cholesterol Mother    Diabetes Mother     Hyperlipidemia Mother    Depression Mother    Hypertension Father    High Cholesterol Father    Mood Disorder Sister    Stroke Sister    Anxiety disorder Maternal Aunt    Tuberculosis Paternal Grandfather    Drug abuse Cousin        Physical Exam: BP (!) 117/51   Pulse 86   Temp 97.9 F (36.6 C) (Axillary)   Resp (!) 25   Ht _0  (1.6 m)   Wt 57.2 kg   SpO2 90%   BMI 22.34 kg/m  Lungs: decreased at bases Card: RR with 2/6 sem Ext: warm and no edema Neuro: alert and oriented and no focal deficits Teeth in good repair    Diagnostic Studies & Laboratory data: I have personally reviewed the following studies and agree with the findings     Recent Radiology Findings:   CARDIAC CATHETERIZATION  Result Date: 10/15/2022 RA mean 4 mmHg RV 45/0 RVEDP 15 mmHg Right PA 47/20 mean 33 mmHg Left PA 47/19 mean 30 mmHg Left PCWP 19/18 mean 13 mmHg Right PCWP 23/27 mean 22 mmHg CO (Fick) 5.2 L/min CI 3.2 CO (thermo) 3.8 L/min CI 2.4 Transpulmonic Gradient (using left PCWP) 8 mmHg, PVR 2.1 [place] Units Plan: review with multidisciplinary valve and advanced HF teams  DG Chest 2 View  Result Date: 10/14/2022 CLINICAL DATA:  Shortness of breath on exertion. EXAM: CHEST - 2 VIEW COMPARISON:  10/04/2022 FINDINGS: Left-sided pacemaker unchanged. Lungs are adequately inflated without focal airspace consolidation or effusion. Mild stable cardiomegaly. Remainder the exam is unchanged. IMPRESSION: 1. No active cardiopulmonary disease. 2. Mild stable cardiomegaly. Electronically Signed   By: Marin Olp M.D.   On: 10/14/2022 10:53      Recent Lab Findings: Lab Results  Component Value Date   WBC 5.8 10/14/2022   HGB 10.2 (L) 10/15/2022   HCT 30.0 (L) 10/15/2022   PLT 215 10/14/2022   GLUCOSE 100 (H) 10/16/2022   CHOL 139 12/19/2021   TRIG 71.0 12/19/2021   HDL 41.60 12/19/2021   LDLDIRECT 78.0 02/07/2018   LDLCALC 83 12/19/2021   ALT 37 (H) 10/01/2022   AST 27 10/01/2022   NA 140  10/16/2022   K 3.9 10/16/2022   CL 109 10/16/2022   CREATININE 0.76 10/16/2022   BUN 17 10/16/2022   CO2 24 10/16/2022   TSH 0.64 09/05/2021   INR 1.1 10/01/2022   HGBA1C 5.5 05/07/2022      Assessment / Plan:     71 yo with NYHA class 3-4 symptoms of acute on chronic systolic CHF with now severe MR from potential chord rupture following mitral clip. Pt with reasonable Right heart function and hemodynamics on work up.  Pt with a difficult situation where ongoing medical management will have poor early results and proceeding with mv replacement will be high risk for post cardiotomy cardiogenic shock from preexisting poor LV function. Would expect to plan for mechanical support following surgery (5.5 impella) if needed with  the understanding that there would not be good options moving forward if LV recovery not sufficient after support Pt understands her situation and is willing to proceed with surgery with all of the increased risks explained and also including long postoperative recovery. Pt is concerned about the care of her demented husband and diabetic dog at home and is in need to arrange support for them. I have discussed the need for plavix washout with pt and have discussed with AHF team the option on allowing her to go home prior to surgery next Wednesday. They are supportive.  I have spent 60 min in review of the records, viewing studies and in face to face with patient and in coordination of future care    Coralie Common 10/16/2022 8:43 AM

## 2022-10-16 NOTE — Discharge Summary (Signed)
Advanced Heart Failure Team  Discharge Summary   Patient ID: Melanie Ray MRN: [id number], DOB/AGE: 1950-12-29 71 y.o. Admit date: 10/14/2022 D/C date:     10/16/2022   Primary Discharge Diagnoses:  Acute on chronic systolic HF Mitral regurgitation, severe  Secondary Discharge Diagnoses:  Coronary artery disease involving native coronary artery of native heart without angina pectoris Essential hypertension LBBB Hypokalemia   Hospital Course:  Melanie Ray  is a 71 y.o. female with a hx of CAD s/p anterior STEMI 5/17 with PCI/DES x 2 to LAD, HFrEF/ischemic cardiomyopathy with LVEF at 20-25%, DM2, HTN, HLD, LBBB with CRT-D placement 01/02/22, OSA, and severe mitral regurgitation who underwent TEER with MitraClip 10/04/22.   She underwent successful transcatheter edge-to-edge mitral valve repair 10/04/22 with MitraClip NTW x1 and NT x1 reducing baseline 4+ mitral regurgitation to trace.   Post op echocardiogram 10/05/22. EF 25% with moderate to severe MR felt secondary to SLDA of the second MitraClip placement. Mean gradient at 61mHg with an average HR at 83bpm. RV normal   [facility] of dyspnea with exertion. [facility] LQuentin Orefor RCramertonto reassess MR. Given 20 mg IV lasix on admit with improvement.  Plavix stopped on admit and will continue on aspirn. RHC which showed: well preserved hemodynamics despite severe MR/mod MS. TCTS  consulted. HF medications adjusted. GDMT limited by low filling pressures. Discharged on bb, arni, and mra. No SGLT2i but can consider after MVT.   Dr WLavonna Monarchplans to do high risk MVR next week. She requested discharge to home to arrange help for her demented husband as well as her sick dog.   Symptoms stable at discharge. Dr BHaroldine Lawsevaluated and deemed appropriate for discharge.   See below for detailed problem list: 1. Acute on chronic systolic HF - initially felt due to ischemic CM. S/p  Anterior STEMI 5/17  - Echo (8/19): EF 30-35%. - cMRI 1/23 EF 15% No LGE. RV ok. Severe LV dyssynchrony Moderate MR  - CRT-D placed 01/02/22 with LBBB lead by Dr. LQuentin Ore-  Echo 07/17/22 EF 20-25% G2DD, RV ok. Severe central MR  - s/p mTEER with 2 clips on 10/04/22 - Post op echocardiogram 10/05/22. EF 25% with moderate to severe MR felt secondary to SLDA of the second MitraClip placement. Mean gradient at 942mg with an average HR at 83bpm. RV normal - Admitted with recurrent NYHA IIIb-IV HF symptoms however CXR clear and BNP at recent nadir - Suspect MR/MS is major factor.  - RHC RA 4, PA 47/20 (33) PCWP 13 CO 5.2 CI 3.1  PVR 2.1  -  Appears euvolemic. Resumed home diuretics.  - Continue current dose of carvedilol and irbesartan  - Placed on spiro.  -Renal function stable.   2. Mitral regurgitation, severe - s/p mTEER with 2 clips on 10/04/22 - Post op echo EF 25% with moderate to severe MR felt secondary to SLDA of the second MitraClip placement. Mean gradient at 45m60m with an average HR at 83bpm. RV normal - Admitted with recurrent NYHA IIIb-IV HF symptoms however CXR clear and BNP at recent nadir - MR/MS is major factor. - Dr. BenHaroldine Lawsw Dr. CooBurt Knackgree that despite hemodynamics she has very severe MV disease in the setting of severely reduced LV function and she is unlikely to do well moving forward. Plan for high risk MVR 12/20. Keep off plavix.  3. Coronary artery disease involving native coronary artery of native heart without  angina pectoris - Hx of anterior STEMI in 5/17 tx with DES x 2 to the LAD.   - Myoview (7/21)  EF 20% no ischemia.   - No LGE on cMRI (1/23) - LHC 9/23 patent LAD stents - NO chest pain.  - Lipid management per PCP. Goal LDL < 70 - Hold Plavix for surgery 4. Essential hypertension - stable, meds as above 5. LBBB - s/p CRT-D 3/23 with Dr. Quentin Ore -. EF has not improved  6. Hypokalemia - Supp as needed.  Discharge Weight Range: 57.2 kg  Discharge  Vitals: Blood pressure 127/72, pulse 81, temperature 99.8 F (37.7 C), temperature source Oral, resp. rate (!) 21, height '5\' 3"'$  (1.6 m), weight 57.2 kg, SpO2 96 %.  Labs: Lab Results  Component Value Date   WBC 5.8 10/14/2022   HGB 10.2 (L) 10/15/2022   HCT 30.0 (L) 10/15/2022   MCV 85.7 10/14/2022   PLT 215 10/14/2022    Recent Labs  Lab 10/16/22 0358  NA 140  K 3.9  CL 109  CO2 24  BUN 17  CREATININE 0.76  CALCIUM 9.2  GLUCOSE 100*   Lab Results  Component Value Date   CHOL 139 12/19/2021   HDL 41.60 12/19/2021   LDLCALC 83 12/19/2021   TRIG 71.0 12/19/2021   BNP (last 3 results) Recent Labs    05/07/22 1853 07/17/22 1458 10/14/22 1130  BNP 2,597.3* 2,184.7* 1,220.2*    ProBNP (last 3 results) Recent Labs    12/19/21 1107 10/01/22 1220 10/10/22 0923  PROBNP 2,342.0* 13,775* 9,776*     Diagnostic Studies/Procedures   CARDIAC CATHETERIZATION  Result Date: 10/15/2022 RA mean 4 mmHg RV 45/0 RVEDP 15 mmHg Right PA 47/20 mean 33 mmHg Left PA 47/19 mean 30 mmHg Left PCWP 19/18 mean 13 mmHg Right PCWP 23/27 mean 22 mmHg CO (Fick) 5.2 L/min CI 3.2 CO (thermo) 3.8 L/min CI 2.4 Transpulmonic Gradient (using left PCWP) 8 mmHg, PVR 2.1 Woods Units Plan: review with multidisciplinary valve and advanced HF teams   Discharge Medications   Allergies as of 10/16/2022       Reactions   Tetracycline Swelling   Meperidine Nausea And Vomiting   (Demerol)Nausea   Entresto [sacubitril-valsartan] Other (See Comments)   Shortness of Breath        Medication List     STOP taking these medications    clopidogrel 75 MG tablet Commonly known as: PLAVIX       TAKE these medications    Aspirin Low Dose 81 MG tablet Generic drug: aspirin EC Take 1 tablet (81 mg total) by mouth daily. Swallow whole.   atorvastatin 80 MG tablet Commonly known as: LIPITOR Take 1 tablet by mouth once daily   carvedilol 6.25 MG tablet Commonly known as: COREG Take 1 tablet  (6.25 mg total) by mouth 2 (two) times daily with a meal. What changed:  medication strength how much to take   dorzolamide-timolol 2-0.5 % ophthalmic solution Commonly known as: COSOPT Place 1 drop into both eyes 2 (two) times daily.   furosemide 20 MG tablet Commonly known as: LASIX Take 1 tablet (20 mg total) by mouth daily.   isosorbide mononitrate 30 MG 24 hr tablet Commonly known as: IMDUR Take 1 tablet by mouth once daily   latanoprost 0.005 % ophthalmic solution Commonly known as: XALATAN Place 1 drop into both eyes at bedtime.   lumateperone tosylate 42 MG capsule Commonly known as: CAPLYTA Take 42 mg by mouth at bedtime.  nitroGLYCERIN 0.4 MG SL tablet Commonly known as: NITROSTAT DISSOLVE ONE TABLET UNDER THE TONGUE EVERY FIVE MINUTES AS NEEDED FOR CHEST PAIN. DO NOT EXCEED A TOTAL OF THREE DOSES IN 15 MINUTES   spironolactone 25 MG tablet Commonly known as: ALDACTONE Take 1 tablet (25 mg total) by mouth daily.   traZODone 150 MG tablet Commonly known as: DESYREL Take 150 mg by mouth at bedtime.   valsartan 80 MG tablet Commonly known as: DIOVAN Take 80 mg by mouth daily.   venlafaxine XR 150 MG 24 hr capsule Commonly known as: EFFEXOR-XR TAKE 2 CAPSULES BY MOUTH ONCE DAILY (NEEDS to follow-up with psychiatry for further refills) What changed:  how much to take how to take this when to take this additional instructions        Disposition   The patient will be discharged in stable condition to home. Discharge Instructions     (HEART FAILURE PATIENTS) Call MD:  Anytime you have any of the following symptoms: 1) 3 pound weight gain in 24 hours or 5 pounds in 1 week 2) shortness of breath, with or without a dry hacking cough 3) swelling in the hands, feet or stomach 4) if you have to sleep on extra pillows at night in order to breathe.   Complete by: As directed    Diet - low sodium heart healthy   Complete by: As directed    Heart Failure  patients record your daily weight using the same scale at the same time of day   Complete by: As directed    Increase activity slowly   Complete by: As directed           Duration of Discharge Encounter: Greater than 35 minutes   Signed, Lorey Pallett NP-C  2:34 PM  10/16/2022, 1:23 PM

## 2022-10-16 NOTE — Progress Notes (Addendum)
Advanced Heart Failure Rounding Note   Subjective:   12/11 RHC - RHC RA 4, PA 47/20 (33) PCWP 13 CO 5.2 CI 3.1  PVR 2.1   Denies SOB. Denies chest pain. Objective:   Weight Range:  Vital Signs:   Temp:  [97.6 F (36.4 C)-98.5 F (36.9 C)] 97.9 F (36.6 C) (12/12 0316) Pulse Rate:  [64-98] 83 (12/12 0600) Resp:  [10-25] 22 (12/12 0600) BP: (88-152)/(35-87) 142/63 (12/12 0600) SpO2:  [87 %-100 %] 96 % (12/12 0600) Weight:  [57.2 kg] 57.2 kg (12/12 0400) Last BM Date : 10/15/22  Weight change: Filed Weights   10/14/22 1733 10/15/22 0500 10/16/22 0400  Weight: 58 kg 58.2 kg 57.2 kg    Intake/Output:   Intake/Output Summary (Last 24 hours) at 10/16/2022 0707 Last data filed at 10/16/2022 0400 Gross per 24 hour  Intake 26 ml  Output 700 ml  Net -674 ml     Physical Exam: General: In bed.  No resp difficulty HEENT: normal Neck: supple. no JVD. Carotids 2+ bilat; no bruits. No lymphadenopathy or thryomegaly appreciated. Cor: PMI nondisplaced. Regular rate & rhythm. No rubs, gallops. 2/6 MR  or murmurs. Lungs: clear on room air.  Abdomen: soft, nontender, nondistended. No hepatosplenomegaly. No bruits or masses. Good bowel sounds. Extremities: no cyanosis, clubbing, rash, edema Neuro: alert & orientedx3, cranial nerves grossly intact. moves all 4 extremities w/o difficulty. Affect pleasant  Telemetry: SR  Labs: Basic Metabolic Panel: Recent Labs  Lab 10/10/22 0923 10/14/22 1025 10/14/22 1922 10/15/22 0432 10/15/22 1009 10/15/22 1010 10/16/22 0358  NA 143 139  --  142 142 142 140  K 4.6 3.5  --  3.6 3.9 3.9 3.9  CL 108* 109  --  108  --   --  109  CO2 23 24  --  26  --   --  24  GLUCOSE 121* 102*  --  96  --   --  100*  BUN 21 22  --  19  --   --  17  CREATININE 1.03* 0.94 1.15* 1.16*  --   --  0.76  CALCIUM 9.6 9.4  --  9.0  --   --  9.2    Liver Function Tests: No results for input(s): "AST", "ALT", "ALKPHOS", "BILITOT", "PROT", "ALBUMIN" in the  last 168 hours. No results for input(s): "LIPASE", "AMYLASE" in the last 168 hours. No results for input(s): "AMMONIA" in the last 168 hours.  CBC: Recent Labs  Lab 10/14/22 1025 10/14/22 1922 10/15/22 1009 10/15/22 1010  WBC 6.0 5.8  --   --   NEUTROABS 4.1  --   --   --   HGB 10.5* 10.4* 10.2* 10.2*  HCT 34.1* 32.3* 30.0* 30.0*  MCV 87.9 85.7  --   --   PLT 208 215  --   --     Cardiac Enzymes: No results for input(s): "CKTOTAL", "CKMB", "CKMBINDEX", "TROPONINI" in the last 168 hours.  BNP: BNP (last 3 results) Recent Labs    05/07/22 1853 07/17/22 1458 10/14/22 1130  BNP 2,597.3* 2,184.7* 1,220.2*    ProBNP (last 3 results) Recent Labs    12/19/21 1107 10/01/22 1220 10/10/22 0923  PROBNP 2,342.0* 13,775* 9,776*      Other results:  Imaging: CARDIAC CATHETERIZATION  Result Date: 10/15/2022 RA mean 4 mmHg RV 45/0 RVEDP 15 mmHg Right PA 47/20 mean 33 mmHg Left PA 47/19 mean 30 mmHg Left PCWP 19/18 mean 13 mmHg Right PCWP 23/27 mean  22 mmHg CO (Fick) 5.2 L/min CI 3.2 CO (thermo) 3.8 L/min CI 2.4 Transpulmonic Gradient (using left PCWP) 8 mmHg, PVR 2.1 Woods Units Plan: review with multidisciplinary valve and advanced HF teams  DG Chest 2 View  Result Date: 10/14/2022 CLINICAL DATA:  Shortness of breath on exertion. EXAM: CHEST - 2 VIEW COMPARISON:  10/04/2022 FINDINGS: Left-sided pacemaker unchanged. Lungs are adequately inflated without focal airspace consolidation or effusion. Mild stable cardiomegaly. Remainder the exam is unchanged. IMPRESSION: 1. No active cardiopulmonary disease. 2. Mild stable cardiomegaly. Electronically Signed   By: Marin Olp M.D.   On: 10/14/2022 10:53     Medications:     Scheduled Medications:  aspirin EC  81 mg Oral Daily   atorvastatin  80 mg Oral Daily   carvedilol  6.25 mg Oral BID WC   Chlorhexidine Gluconate Cloth  6 each Topical Daily   dorzolamide-timolol  1 drop Both Eyes BID   enoxaparin (LOVENOX) injection   40 mg Subcutaneous Q24H   furosemide  20 mg Oral Daily   insulin aspart  0-15 Units Subcutaneous TID WC   irbesartan  75 mg Oral Daily   isosorbide mononitrate  30 mg Oral Daily   latanoprost  1 drop Both Eyes QHS   lumateperone tosylate  42 mg Oral QHS   sodium chloride flush  3 mL Intravenous Q12H   traZODone  150 mg Oral QHS   venlafaxine XR  300 mg Oral Daily    Infusions:  sodium chloride      PRN Medications: sodium chloride, acetaminophen, sodium chloride flush   Assessment/Plan:   1. Acute on chronic systolic HF - initially felt due to ischemic CM. S/p Anterior STEMI 5/17  - Echo (8/19): EF 30-35%. - cMRI 1/23 EF 15% No LGE. RV ok. Severe LV dyssynchrony Moderate MR  - CRT-D placed 01/02/22 with LBBB lead by Dr. Quentin Ore -  Echo 07/17/22 EF 20-25% G2DD, RV ok. Severe central MR  - s/p mTEER with 2 clips on 10/04/22 - Post op echocardiogram 10/05/22. EF 25% with moderate to severe MR felt secondary to SLDA of the second MitraClip placement. Mean gradient at 51mHg with an average HR at 83bpm. RV normal - Admitted with recurrent NYHA IIIb-IV HF symptoms however CXR clear and BNP at recent nadir - Suspect MR/MS is major factor.  - RHC RA 4, PA 47/20 (33) PCWP 13 CO 5.2 CI 3.1  PVR 2.1  -  Appears euvolemic.  - Continue current dose of carvedilol and irbesartan  - Add 12.5 mg spiro daily - Renal function stable.    2. Mitral regurgitation, severe - s/p mTEER with 2 clips on 10/04/22 - Post op echo EF 25% with moderate to severe MR felt secondary to SLDA of the second MitraClip placement. Mean gradient at 947mg with an average HR at 83bpm. RV normal - Admitted with recurrent NYHA IIIb-IV HF symptoms however CXR clear and BNP at recent nadir - Suspect MR/MS is major factor. - Will likely need to consider if she is candidate for high risk MVR - Dr WeLavonna Monarchlanning to see today.    3. Coronary artery disease involving native coronary artery of native heart without angina  pectoris - Hx of anterior STEMI in 5/17 tx with DES x 2 to the LAD.   - Myoview (7/21)  EF 20% no ischemia.   - No LGE on cMRI (1/23) - LHC 9/23 patent LAD stents - NO chest pain.  - Lipid management per PCP. Goal  LDL < 70 - Hold Plavix for possible surgery   4. Essential hypertension - BP slightly elevated today. Titrate meds as needed after cath,   5. LBBB - s/p CRT-D 3/23 with Dr. Quentin Ore -. EF has not improved    6. Hypokalemia - Stable.  Length of Stay: 2   Amy Clegg NP-C  10/16/2022, 7:07 AM  Advanced Heart Failure Team Pager [phone] (M-F; 7a - 4p)  Please contact [facility] for night-coverage after hours (4p -7a ) and weekends on [email or web address]  Patient seen and examined with the above-signed Advanced Practice Provider and/or Housestaff. I personally reviewed laboratory data, imaging studies and relevant notes. I independently examined the patient and formulated the important aspects of the plan. I have edited the note to reflect any of my changes or salient points. I have personally discussed the plan with the patient and/or family.  RHC hemodynamics well preserved despite very severe MR/moderate MS. Feels ok today. Has been seen by TCTS for possible MVR. Denies CP or SOB  General:  Sitting up in bed No resp difficulty HEENT: normal Neck: supple. no JVD. Carotids 2+ bilat; no bruits. No lymphadenopathy or thryomegaly appreciated. Cor: PMI nondisplaced. Regular rate & rhythm. 2/6 MR Lungs: clear Abdomen: soft, nontender, nondistended. No hepatosplenomegaly. No bruits or masses. Good bowel sounds. Extremities: no cyanosis, clubbing, rash, edema Neuro: alert & orientedx3, cranial nerves grossly intact. moves all 4 extremities w/o difficulty. Affect pleasant  Case d/w Dr. Burt Knack. Agree that despite hemodynamics she has very severe MV disease in the setting of severely reduced LV function and she is unlikely to do well moving forward. Suspect she will develop progressive  RV dysfunction over time so I think now is the time to offer her high-risk MVR if she is willing. Can go to the floor.   Glori Bickers, MD  8:26 AM

## 2022-10-16 NOTE — Plan of Care (Signed)

## 2022-10-16 NOTE — Consult Note (Signed)
   [facility] CM Inpatient Consult   10/16/2022  Melanie Ray [birth year] [id number]  [facility] [ACO] Patient: Medicare ACO REACH  Primary Care Provider:  Leone Haven, MD, [place] [facility], is listed to provide the transition of care follow up   Patient screened for less than 30 days readmission hospitalization with noted medium risk score for unplanned readmission risk. Also, to assess for potential [place] [facility] service needs for post hospital transition for care coordination.  Review of patient's electronic medical record MD progress notes, SDOH wheel,  inpatient HF TOC team notes for needs reveals patient is for home.   Call attempt to check for medication noted for potential follow up needs.  Came to the room and patient was in the midst of a procedure to discuss any needs.  Plan:  Continue to follow up as time permits  Referral request for community care coordination:  Patient to have a TOC follow up call.   Of note, [facility] Care Management/Population Health does not replace or interfere with any arrangements made by the Inpatient Transition of Care team.  For questions contact:   Natividad Brood, RN BSN [place]  [phone] business mobile phone Toll free office [phone]  *[place]  [phone] Fax number: [phone] [name]

## 2022-10-16 NOTE — TOC Benefit Eligibility Note (Signed)
Patient Teacher, English as a foreign language completed.    The patient is currently admitted and upon discharge could be taking Entresto 24-26 mg.  The current 30 day co-pay is $332.62 due to a $285.62 deductible remaining.   The patient is currently admitted and upon discharge could be taking [name] 10 mg.  The current 30 day co-pay is $445.39 due to a $285.62 deductible remaining.   The patient is currently admitted and upon discharge could be taking [name] 10 mg.  The current 30 day co-pay is $332.62 due to a $285.62 deductible remaining.   The patient is insured through [place], [place] Patient Advocate Specialist [place] Patient Advocate Team Direct Number: [phone]  Fax: [phone]

## 2022-10-17 ENCOUNTER — Telehealth: Payer: Self-pay | Admitting: *Deleted

## 2022-10-17 NOTE — Patient Outreach (Signed)
  Care Coordination [facility] Note Transition Care Management Follow-up Telephone Call Date of discharge and from where: [facility] [id number] How have you been since you were released from the hospital? Doing good Any questions or concerns? No  Items Reviewed: Did the pt receive and understand the discharge instructions provided? Yes  Medications obtained and verified? Yes  Other? Yes patient had forgot about the aspirin. RN explained that it was  Any new allergies since your discharge? No  Dietary orders reviewed? Yes low sodium heart healthy Do you have support at home? Yes   Home Care and Equipment/Supplies: Were home health services ordered? no If so, what is the name of the agency? N/a  Has the agency set up a time to come to the patient's home? not applicable Were any new equipment or medical supplies ordered?  No What is the name of the medical supply agency? N/a Were you able to get the supplies/equipment? not applicable Do you have any questions related to the use of the equipment or supplies? No  Functional Questionnaire: (I = Independent and D = Dependent) ADLs: I  Bathing/Dressing- I  Meal Prep- I  Eating- I  Maintaining continence- I  Transferring/Ambulation- I  Managing Meds- I  Follow up appointments reviewed:  PCP Hospital f/u appt confirmed? No   Specialist Hospital f/u appt confirmed? Yes   [id number] Mitral Valve surgery [id number] 8:14 [facility] [id number] ECHO [id number] Dr Haroldine Laws 2:20 Are transportation arrangements needed? No  If their condition worsens, is the pt aware to call PCP or go to the Emergency Dept.? Yes Was the patient provided with contact information for the PCP's office or ED? Yes Was to pt encouraged to call back with questions or concerns? Yes  SDOH assessments and interventions completed:   Yes  SDOH Screenings   Food Insecurity: No Food Insecurity (10/17/2022)  Housing: Low Risk  (10/17/2022)  Transportation Needs: No  Transportation Needs (10/17/2022)  Utilities: Not At Risk (10/15/2022)  Depression (PHQ2-9): Medium Risk (09/25/2022)  Financial Resource Strain: Low Risk  (03/30/2022)  Social Connections: Unknown (03/30/2022)  Stress: No Stress Concern Present (03/30/2022)  Tobacco Use: Medium Risk (10/16/2022)     Care Coordination Interventions:  RN went over the type of aspirin that was described.      Encounter Outcome:  Pt. Visit Completed   [facility] [phone]

## 2022-10-19 ENCOUNTER — Encounter: Payer: Self-pay | Admitting: Cardiology

## 2022-10-22 ENCOUNTER — Encounter: Payer: Medicare Other | Admitting: Internal Medicine

## 2022-10-23 ENCOUNTER — Encounter: Payer: Self-pay | Admitting: Cardiology

## 2022-10-23 ENCOUNTER — Other Ambulatory Visit: Payer: Self-pay

## 2022-10-23 ENCOUNTER — Encounter (HOSPITAL_COMMUNITY): Payer: Self-pay | Admitting: Thoracic Surgery (Cardiothoracic Vascular Surgery)

## 2022-10-23 MED ORDER — TRANEXAMIC ACID (OHS) PUMP PRIME SOLUTION
2.0000 mg/kg | INTRAVENOUS | Status: DC
  Filled 2022-10-23: qty 1.14

## 2022-10-23 MED ORDER — EPINEPHRINE HCL 5 MG/250ML IV SOLN IN NS
0.0000 ug/min | INTRAVENOUS | Status: AC
  Administered 2022-10-24: 3 ug/min via INTRAVENOUS
  Filled 2022-10-23: qty 250

## 2022-10-23 MED ORDER — MANNITOL 20 % IV SOLN
INTRAVENOUS | Status: DC
  Filled 2022-10-23: qty 13

## 2022-10-23 MED ORDER — TRANEXAMIC ACID (OHS) BOLUS VIA INFUSION
15.0000 mg/kg | INTRAVENOUS | Status: AC
  Administered 2022-10-24: 858 mg via INTRAVENOUS
  Filled 2022-10-23: qty 858

## 2022-10-23 MED ORDER — HEPARIN 30,000 UNITS/1000 ML (OHS) CELLSAVER SOLUTION
Status: DC
  Filled 2022-10-23: qty 1000

## 2022-10-23 MED ORDER — CEFAZOLIN SODIUM-DEXTROSE 2-4 GM/100ML-% IV SOLN
2.0000 g | INTRAVENOUS | Status: DC
  Filled 2022-10-23: qty 100

## 2022-10-23 MED ORDER — POTASSIUM CHLORIDE 2 MEQ/ML IV SOLN
80.0000 meq | INTRAVENOUS | Status: DC
  Filled 2022-10-23: qty 40

## 2022-10-23 MED ORDER — MILRINONE LACTATE IN DEXTROSE 20-5 MG/100ML-% IV SOLN
0.3000 ug/kg/min | INTRAVENOUS | Status: AC
  Administered 2022-10-24: .375 ug/kg/min via INTRAVENOUS
  Filled 2022-10-23: qty 100

## 2022-10-23 MED ORDER — NOREPINEPHRINE 4 MG/250ML-% IV SOLN
0.0000 ug/min | INTRAVENOUS | Status: DC
  Administered 2022-10-24: 2 ug/min via INTRAVENOUS
  Filled 2022-10-23: qty 250

## 2022-10-23 MED ORDER — PLASMA-LYTE A IV SOLN
INTRAVENOUS | Status: DC
  Filled 2022-10-23: qty 2.5

## 2022-10-23 MED ORDER — PHENYLEPHRINE HCL-NACL 20-0.9 MG/250ML-% IV SOLN
30.0000 ug/min | INTRAVENOUS | Status: DC
  Filled 2022-10-23: qty 250

## 2022-10-23 MED ORDER — VANCOMYCIN HCL 1250 MG/250ML IV SOLN
1250.0000 mg | INTRAVENOUS | Status: AC
  Administered 2022-10-24: 1250 mg via INTRAVENOUS
  Filled 2022-10-23: qty 250

## 2022-10-23 MED ORDER — DEXMEDETOMIDINE HCL IN NACL 400 MCG/100ML IV SOLN
0.1000 ug/kg/h | INTRAVENOUS | Status: AC
  Administered 2022-10-24: .4 ug/kg/h via INTRAVENOUS
  Filled 2022-10-23: qty 100

## 2022-10-23 MED ORDER — CEFAZOLIN SODIUM-DEXTROSE 2-4 GM/100ML-% IV SOLN
2.0000 g | INTRAVENOUS | Status: AC
  Administered 2022-10-24 (×2): 2 g via INTRAVENOUS
  Filled 2022-10-23: qty 100

## 2022-10-23 MED ORDER — TRANEXAMIC ACID 1000 MG/10ML IV SOLN
1.5000 mg/kg/h | INTRAVENOUS | Status: AC
  Administered 2022-10-24: 1.5 mg/kg/h via INTRAVENOUS
  Filled 2022-10-23: qty 25

## 2022-10-23 MED ORDER — INSULIN REGULAR(HUMAN) IN NACL 100-0.9 UT/100ML-% IV SOLN
INTRAVENOUS | Status: AC
  Administered 2022-10-24: .7 [IU]/h via INTRAVENOUS
  Filled 2022-10-23: qty 100

## 2022-10-23 MED ORDER — VANCOMYCIN HCL 1000 MG IV SOLR
INTRAVENOUS | Status: DC
  Filled 2022-10-23: qty 20

## 2022-10-23 NOTE — Progress Notes (Signed)
Remote ICD transmission.   

## 2022-10-23 NOTE — Progress Notes (Signed)
PERIOPERATIVE PRESCRIPTION FOR IMPLANTED CARDIAC DEVICE PROGRAMMING  Patient Information: Name:  Stephaine Breshears  DOB:  11-15-1950  MRN:  [id number]    Planned Procedure:  Mitral Valve Replacement  Surgeon:  Dr. Lavonna Monarch  Date of Procedure:  10/24/22  Cautery will be used.  Position during surgery:  supine   Please send documentation back to:  [facility] (Fax # [phone])   Device Information:  Clinic EP Physician:  Dr. Lars Mage   Device Type:  Defibrillator Manufacturer and Phone #:  [employer]: [phone] Pacemaker Dependent?:  No. Date of Last Device Check:  10/05/2022 Normal Device Function?:  Yes.    Electrophysiologist's Recommendations:  Have magnet available. Provide continuous ECG monitoring when magnet is used or reprogramming is to be performed.  Procedure will likely interfere with device function.  Device should be programmed:  Tachy therapies disabled  Per Device Clinic Standing Orders, [name], RN  2:29 PM 10/23/2022

## 2022-10-23 NOTE — Progress Notes (Signed)
Spoke with pt for pre-op call. Pt has extensive cardiac history. Pt has an ICD ([employer]). Spoke with Remo Lipps, rep for [employer]. He states he will add patient to the calendar. He was given surgery date, start time and pt's arrival time. Pt states she is pre-diabetic. Last A1C was 5.5 on 05/07/22.   Shower instructions given to pt and she voiced understanding.

## 2022-10-23 NOTE — H&P (Signed)
[address]       [place] [address]             [phone]                                   Melanie Ray Medical Record #[id number] Date of Birth: 1951/08/12   Melanie Mew, MD Melanie Haven, MD   Chief Complaint: increasing DOE following mitral clip     History of Present Illness:     71 yo female with ischemic cardiomyopathy following MI treated aggressively medically and with CRRT/defibrillator who was recently treated for severe MR with mitral clip 3 weeks ago. Procedurally excellent result however following day had loud murmur and recurrent MR. Pt now with CHF symptoms worse than prior to mitral clip with severe DOE with minimal exertion. No CP. Work up with echo now with EF 20-25% and severe MR with mean gradient of 34mHg. There is very good Right heart function. She has no evidence of SLD of clip but etiology may be secondary to injured chord/rupture. She had RHC yesterday which has surprisingly adequate hemodynamics considering her LV function. I have been asked to consider mv surgery             Past Medical History:  Diagnosis Date   Anemia     Anxiety     Back pain     CAD (coronary artery disease) 04/11/2016    S/p ant STEMI 5/17: LHC >> LAD proximal 80%, mid 80%, distal 50%, ostial D1 60%; LCx with LPDA lesion 30%; RCA Mild calcification with no significant stenosis in a medium caliber, nondominant RCA; LVEF is estimated at 45% with inferoapical and lateral wall akinesis >> PCI: PCI: 3.5 x 24 mm Promus DES to prox LAD, 2.5 x 12 mm Promus DES to mid LAD. // Myoview 7/21: EF 20, no ischemia; high risk    Chest pain     Chronic systolic CH[place] (congestive heart failure) [place] 03/21/2016    Echo 01/30/17: Diff HK, mild focal basal septal hypertrophy, EF 30-35, mild AI, MAC, mild MR // Echo 06/08/16: Mild focal basal septal hypertrophy, EF 25-30%, diff HK, ant-septal AK, Gr 1 DD, mild AI, MAC, mild MR, PASP 37 mmHg // Echo  03/18/16: EF 30-35%, ant-septal AK, Gr 1 DD, mild MR, severe LAE.   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 CBurt Knackand Dr. TAli Lowe  Sleep apnea     SOB (shortness of breath)     Suicidal ideation 08/27/2018   Swelling      feet or legs  Past Surgical History:  Procedure Laterality Date   APPENDECTOMY       BIV ICD INSERTION CRT-D N/A 01/02/2022    Procedure: BIV ICD INSERTION CRT-D;  Surgeon: Vickie Epley, MD;  Location: [place] [facility];  Service: Cardiovascular;  Laterality: N/A;   CARDIAC CATHETERIZATION N/A 03/17/2016    Procedure: Left Heart Cath and Coronary Angiography;  Surgeon: Sherren Mocha, MD;  Location: [place] [facility];  Service: Cardiovascular;  Laterality: N/A;   CARDIAC CATHETERIZATION N/A 03/17/2016    Procedure: Coronary Stent Intervention;  Surgeon: Sherren Mocha, MD;  Location: [place] [facility];  Service: Cardiovascular;  Laterality: N/A;   COLONOSCOPY WITH PROPOFOL N/A 08/13/2017    Procedure: COLONOSCOPY WITH PROPOFOL;  Surgeon: Jonathon Bellows, MD;  Location: [facility] ENDOSCOPY;  Service: Gastroenterology;  Laterality: N/A;   COLONOSCOPY WITH PROPOFOL N/A 03/29/2020    Procedure: COLONOSCOPY WITH PROPOFOL;  Surgeon: Lucilla Lame, MD;  Location: [facility] ENDOSCOPY;  Service: Endoscopy;  Laterality: N/A;   MITRAL VALVE REPAIR N/A 10/04/2022    Procedure: MITRAL VALVE REPAIR;  Surgeon: Sherren Mocha, MD;  Location: [place] [facility];  Service:  Cardiovascular;  Laterality: N/A;   RIGHT HEART CATH N/A 10/15/2022    Procedure: RIGHT HEART CATH;  Surgeon: Sherren Mocha, MD;  Location: [place] [facility];  Service: Cardiovascular;  Laterality: N/A;   RIGHT/LEFT HEART CATH AND CORONARY ANGIOGRAPHY N/A 08/02/2022    Procedure: RIGHT/LEFT HEART CATH AND CORONARY ANGIOGRAPHY;  Surgeon: Jolaine Artist, MD;  Location: [place] [facility];  Service: Cardiovascular;  Laterality: N/A;   SMALL BOWEL REPAIR       TEE WITHOUT CARDIOVERSION N/A 08/02/2022    Procedure: TRANSESOPHAGEAL ECHOCARDIOGRAM (TEE);  Surgeon: Jolaine Artist, MD;  Location: [facility] ENDOSCOPY;  Service: Cardiovascular;  Laterality: N/A;   TEE WITHOUT CARDIOVERSION N/A 10/04/2022    Procedure: TRANSESOPHAGEAL ECHOCARDIOGRAM (TEE);  Surgeon: Sherren Mocha, MD;  Location: [place] [facility];  Service: Cardiovascular;  Laterality: N/A;   TONSILLECTOMY       UTERINE FIBROID SURGERY          Social History        Tobacco Use  Smoking Status Former   Types: Cigarettes   Quit date: 04/13/1997   Years since quitting: 25.5  Smokeless Tobacco Never    Social History       Substance and Sexual Activity  Alcohol Use Not Currently      Social History         Socioeconomic History   Marital status: Married      Spouse name: Melanie Ray   Number of children: 0   Years of education: BS in education   Highest education level: Not on file  Occupational History   Occupation:  Retired Education officer, museum  Tobacco Use   Smoking status: Former      Types: Cigarettes      Quit date: 04/13/1997      Years since quitting: 25.5   Smokeless tobacco: Never  Vaping Use   Vaping Use: Never used  Substance and Sexual Activity   Alcohol use: Not Currently   Drug use: No   Sexual activity: Not Currently  Other Topics Concern   Not on file  Social History Narrative    Lives in [place] with spouse.  No children.    Retired first [employer] for over 30 years ([place] for  10 years and then in [place] for over 20 years).    Left-handed  Lives in a two story home          Social Determinants of Health        Financial Resource Strain: Low Risk  (03/30/2022)    Overall Financial Resource Strain (CARDIA)     Difficulty of Paying Living Expenses: Not hard at all  Food Insecurity: No Food Insecurity (10/15/2022)    Hunger Vital Sign     Worried About Running Out of Food in the Last Year: Never true     Ran Out of Food in the Last Year: Never true  Transportation Needs: No Transportation Needs (10/15/2022)    PRAPARE - Armed forces logistics/support/administrative officer (Medical): No     Lack of Transportation (Non-Medical): No  Physical Activity: Not on file  Stress: No Stress Concern Present (03/30/2022)    [employer]     Feeling of Stress : Not at all  Social Connections: Unknown (03/30/2022)    Social Connection and Isolation Panel [NHANES]     Frequency of Communication with Friends and Family: Not on file     Frequency of Social Gatherings with Friends and Family: Not on file     Attends Religious Services: Not on file     Active Member of Clubs or Organizations: Not on file     Attends Archivist Meetings: Not on file     Marital Status: Married  Intimate Partner Violence: Not At Risk (10/15/2022)    Humiliation, Afraid, Rape, and Kick questionnaire     Fear of Current or Ex-Partner: No     Emotionally Abused: No     Physically Abused: No     Sexually Abused: No           Allergies  Allergen Reactions   Tetracycline Swelling   Meperidine Nausea And Vomiting      (Demerol)Nausea   Entresto [Sacubitril-Valsartan] Other (See Comments)      Shortness of Breath               Current Facility-Administered Medications  Medication Dose Route Frequency Provider Last Rate Last Admin   0.9 %  sodium chloride infusion  250 mL Intravenous PRN Sherren Mocha, MD        acetaminophen (TYLENOL) tablet 650 mg  650 mg Oral Q4H PRN Sherren Mocha, MD       aspirin EC tablet 81 mg  81 mg Oral Daily Sherren Mocha, MD   81 mg at 10/15/22 1343   atorvastatin (LIPITOR) tablet 80 mg  80 mg Oral Daily Sherren Mocha, MD   80 mg at 10/15/22 0904   carvedilol (COREG) tablet 6.25 mg  6.25 mg Oral BID WC Sherren Mocha, MD   6.25 mg at 10/16/22 6295   Chlorhexidine Gluconate Cloth 2 % PADS 6 each  6 each Topical Daily Sherren Mocha, MD   6 each at 10/15/22 0905   dorzolamide-timolol (COSOPT) 2-0.5 % ophthalmic solution 1 drop  1 drop Both Eyes BID Sherren Mocha, MD   1 drop at 10/15/22 2202   enoxaparin (LOVENOX) injection 40 mg  40 mg Subcutaneous Q24H Sherren Mocha, MD   40 mg at 10/15/22 2024   furosemide (LASIX) tablet 20 mg  20 mg Oral Daily Sherren Mocha, MD   20 mg at 10/15/22 0904   insulin aspart (novoLOG) injection 0-15 Units  0-15 Units Subcutaneous TID WC Sherren Mocha, MD       irbesartan (AVAPRO) tablet 75 mg  75 mg Oral Daily Sherren Mocha, MD   75 mg at 10/15/22 4259   isosorbide mononitrate (IMDUR) 24 hr tablet 30 mg  30 mg Oral Daily Sherren Mocha, MD   30 mg at 10/15/22 0904   latanoprost (XALATAN) 0.005 % ophthalmic solution 1 drop  1 drop Both Eyes QHS Sherren Mocha, MD   1 drop at 10/15/22 2205   lumateperone tosylate (CAPLYTA) capsule 42 mg  42 mg Oral Pollie Meyer, MD   42 mg at 10/15/22 2203   sodium chloride flush (NS) 0.9 % injection 3 mL  3 mL Intravenous Q12H Sherren Mocha, MD   3 mL at 10/15/22 0906   sodium chloride flush (NS) 0.9 % injection 3 mL  3 mL Intravenous PRN Sherren Mocha, MD       spironolactone (ALDACTONE) tablet 12.5 mg  12.5 mg Oral Daily Clegg, Amy D, NP       traZODone (DESYREL) tablet 150 mg  150 mg Oral Pollie Meyer, MD   150 mg at 10/15/22 2203   venlafaxine XR (EFFEXOR-XR) 24 hr capsule 300 mg  300 mg Oral Daily Sherren Mocha, MD   300 mg at 10/15/22 0909             Family History   Problem Relation Age of Onset   Anxiety disorder Mother     Paranoid behavior Mother     Hypertension Mother     Dementia Mother     High Cholesterol Mother     Diabetes Mother     Hyperlipidemia Mother     Depression Mother     Hypertension Father     High Cholesterol Father     Mood Disorder Sister     Stroke Sister     Anxiety disorder Maternal Aunt     Tuberculosis Paternal Grandfather     Drug abuse Cousin              Physical Exam: BP (!) 117/51   Pulse 86   Temp 97.9 F (36.6 C) (Axillary)   Resp (!) 25   Ht _0  (1.6 m)   Wt 57.2 kg   SpO2 90%   BMI 22.34 kg/m  Lungs: decreased at bases Card: RR with 2/6 sem Ext: warm and no edema Neuro: alert and oriented and no focal deficits Teeth in good repair       Diagnostic Studies & Laboratory data: I have personally reviewed the following studies and agree with the findings     Recent Radiology Findings:    Imaging Results (Last 48 hours)  CARDIAC CATHETERIZATION   Result Date: 10/15/2022 RA mean 4 mmHg RV 45/0 RVEDP 15 mmHg Right PA 47/20 mean 33 mmHg Left PA 47/19 mean 30 mmHg Left PCWP 19/18 mean 13 mmHg Right PCWP 23/27 mean 22 mmHg CO (Fick) 5.2 L/min CI 3.2 CO (thermo) 3.8 L/min CI 2.4 Transpulmonic Gradient (using left PCWP) 8 mmHg, PVR 2.1 Woods Units Plan: review with multidisciplinary valve and advanced HF teams   DG Chest 2 View   Result Date: 10/14/2022 CLINICAL DATA:  Shortness of breath on exertion. EXAM: CHEST - 2 VIEW COMPARISON:  10/04/2022 FINDINGS: Left-sided pacemaker unchanged. Lungs are adequately inflated without focal airspace consolidation or effusion. Mild stable cardiomegaly. Remainder the exam is unchanged. IMPRESSION: 1. No active cardiopulmonary disease. 2. Mild stable cardiomegaly. Electronically Signed   By: Marin Olp M.D.   On: 10/14/2022 10:53         Recent Lab Findings:  Recent Labs       Lab Results  Component Value Date    WBC 5.8 10/14/2022    HGB 10.2 (L)  10/15/2022    HCT 30.0 (L) 10/15/2022    PLT 215 10/14/2022    GLUCOSE 100 (H) 10/16/2022    CHOL 139 12/19/2021    TRIG 71.0 12/19/2021    HDL 41.60 12/19/2021    LDLDIRECT 78.0 02/07/2018    LDLCALC 83 12/19/2021    ALT 37 (H) 10/01/2022    AST 27 10/01/2022    NA 140 10/16/2022    K 3.9 10/16/2022    CL 109 10/16/2022    CREATININE 0.76 10/16/2022    BUN 17 10/16/2022    CO2 24 10/16/2022    TSH 0.64 09/05/2021    INR 1.1 10/01/2022    HGBA1C 5.5 05/07/2022            Assessment / Plan:     71 yo with NYHA class 3-4 symptoms of acute on chronic systolic CHF with now severe MR from potential chord rupture following mitral clip. Pt with reasonable Right heart function and hemodynamics on work up.  Pt with a difficult situation where ongoing medical management will have poor early results and proceeding with mv replacement will be high risk for post cardiotomy cardiogenic shock from preexisting poor LV function. Would expect to plan for mechanical support following surgery (5.5 impella) if needed with the understanding that there would not be good options moving forward if LV recovery not sufficient after support

## 2022-10-24 ENCOUNTER — Encounter (HOSPITAL_COMMUNITY)
Admission: RE | Disposition: A | Discharge status: Home Health | Payer: Self-pay | Source: Home / Self Care | Attending: Thoracic Surgery (Cardiothoracic Vascular Surgery)

## 2022-10-24 ENCOUNTER — Other Ambulatory Visit: Payer: Self-pay

## 2022-10-24 ENCOUNTER — Inpatient Hospital Stay (HOSPITAL_COMMUNITY): Payer: Medicare Other | Admitting: Anesthesiology

## 2022-10-24 ENCOUNTER — Inpatient Hospital Stay (HOSPITAL_COMMUNITY): Payer: Medicare Other

## 2022-10-24 ENCOUNTER — Inpatient Hospital Stay (HOSPITAL_COMMUNITY)
Admission: RE | Admit: 2022-10-24 | Discharge: 2022-10-30 | DRG: 219 | Disposition: A | Discharge status: Home Health | Payer: Medicare Other | Attending: Thoracic Surgery (Cardiothoracic Vascular Surgery) | Admitting: Thoracic Surgery (Cardiothoracic Vascular Surgery)

## 2022-10-24 ENCOUNTER — Encounter (HOSPITAL_COMMUNITY): Payer: Self-pay | Admitting: Thoracic Surgery (Cardiothoracic Vascular Surgery)

## 2022-10-24 DIAGNOSIS — I517 Cardiomegaly: Secondary | ICD-10-CM | POA: Diagnosis not present

## 2022-10-24 DIAGNOSIS — Z87891 Personal history of nicotine dependence: Secondary | ICD-10-CM

## 2022-10-24 DIAGNOSIS — I5022 Chronic systolic (congestive) heart failure: Secondary | ICD-10-CM

## 2022-10-24 DIAGNOSIS — I340 Nonrheumatic mitral (valve) insufficiency: Secondary | ICD-10-CM | POA: Diagnosis not present

## 2022-10-24 DIAGNOSIS — E119 Type 2 diabetes mellitus without complications: Secondary | ICD-10-CM | POA: Diagnosis not present

## 2022-10-24 DIAGNOSIS — R739 Hyperglycemia, unspecified: Secondary | ICD-10-CM

## 2022-10-24 DIAGNOSIS — Z823 Family history of stroke: Secondary | ICD-10-CM

## 2022-10-24 DIAGNOSIS — Z83438 Family history of other disorder of lipoprotein metabolism and other lipidemia: Secondary | ICD-10-CM | POA: Diagnosis not present

## 2022-10-24 DIAGNOSIS — I700 Atherosclerosis of aorta: Secondary | ICD-10-CM | POA: Diagnosis not present

## 2022-10-24 DIAGNOSIS — I110 Hypertensive heart disease with heart failure: Secondary | ICD-10-CM

## 2022-10-24 DIAGNOSIS — R0489 Hemorrhage from other sites in respiratory passages: Secondary | ICD-10-CM | POA: Diagnosis not present

## 2022-10-24 DIAGNOSIS — I509 Heart failure, unspecified: Secondary | ICD-10-CM | POA: Diagnosis not present

## 2022-10-24 DIAGNOSIS — E1165 Type 2 diabetes mellitus with hyperglycemia: Secondary | ICD-10-CM | POA: Diagnosis not present

## 2022-10-24 DIAGNOSIS — I2510 Atherosclerotic heart disease of native coronary artery without angina pectoris: Secondary | ICD-10-CM

## 2022-10-24 DIAGNOSIS — E876 Hypokalemia: Secondary | ICD-10-CM | POA: Diagnosis present

## 2022-10-24 DIAGNOSIS — Z7984 Long term (current) use of oral hypoglycemic drugs: Secondary | ICD-10-CM

## 2022-10-24 DIAGNOSIS — I255 Ischemic cardiomyopathy: Secondary | ICD-10-CM | POA: Diagnosis present

## 2022-10-24 DIAGNOSIS — I059 Rheumatic mitral valve disease, unspecified: Secondary | ICD-10-CM | POA: Diagnosis not present

## 2022-10-24 DIAGNOSIS — Z8249 Family history of ischemic heart disease and other diseases of the circulatory system: Secondary | ICD-10-CM

## 2022-10-24 DIAGNOSIS — I5023 Acute on chronic systolic (congestive) heart failure: Secondary | ICD-10-CM | POA: Diagnosis not present

## 2022-10-24 DIAGNOSIS — R0602 Shortness of breath: Secondary | ICD-10-CM | POA: Diagnosis not present

## 2022-10-24 DIAGNOSIS — Z95810 Presence of automatic (implantable) cardiac defibrillator: Secondary | ICD-10-CM | POA: Diagnosis not present

## 2022-10-24 DIAGNOSIS — I252 Old myocardial infarction: Secondary | ICD-10-CM

## 2022-10-24 DIAGNOSIS — I447 Left bundle-branch block, unspecified: Secondary | ICD-10-CM | POA: Diagnosis present

## 2022-10-24 DIAGNOSIS — Z8659 Personal history of other mental and behavioral disorders: Secondary | ICD-10-CM

## 2022-10-24 DIAGNOSIS — I9581 Postprocedural hypotension: Secondary | ICD-10-CM

## 2022-10-24 DIAGNOSIS — I5020 Unspecified systolic (congestive) heart failure: Secondary | ICD-10-CM | POA: Diagnosis not present

## 2022-10-24 DIAGNOSIS — F419 Anxiety disorder, unspecified: Secondary | ICD-10-CM | POA: Diagnosis present

## 2022-10-24 DIAGNOSIS — D62 Acute posthemorrhagic anemia: Secondary | ICD-10-CM | POA: Diagnosis not present

## 2022-10-24 DIAGNOSIS — I97618 Postprocedural hemorrhage and hematoma of a circulatory system organ or structure following other circulatory system procedure: Secondary | ICD-10-CM | POA: Diagnosis not present

## 2022-10-24 DIAGNOSIS — Z978 Presence of other specified devices: Secondary | ICD-10-CM

## 2022-10-24 DIAGNOSIS — Z79899 Other long term (current) drug therapy: Secondary | ICD-10-CM

## 2022-10-24 DIAGNOSIS — I083 Combined rheumatic disorders of mitral, aortic and tricuspid valves: Secondary | ICD-10-CM | POA: Diagnosis not present

## 2022-10-24 DIAGNOSIS — I428 Other cardiomyopathies: Secondary | ICD-10-CM | POA: Diagnosis not present

## 2022-10-24 DIAGNOSIS — Z9911 Dependence on respirator [ventilator] status: Secondary | ICD-10-CM | POA: Diagnosis not present

## 2022-10-24 DIAGNOSIS — I3139 Other pericardial effusion (noninflammatory): Secondary | ICD-10-CM | POA: Diagnosis present

## 2022-10-24 DIAGNOSIS — Z955 Presence of coronary angioplasty implant and graft: Secondary | ICD-10-CM

## 2022-10-24 DIAGNOSIS — E785 Hyperlipidemia, unspecified: Secondary | ICD-10-CM | POA: Diagnosis present

## 2022-10-24 DIAGNOSIS — Z9889 Other specified postprocedural states: Secondary | ICD-10-CM

## 2022-10-24 DIAGNOSIS — Z7901 Long term (current) use of anticoagulants: Secondary | ICD-10-CM

## 2022-10-24 DIAGNOSIS — Z833 Family history of diabetes mellitus: Secondary | ICD-10-CM

## 2022-10-24 DIAGNOSIS — D696 Thrombocytopenia, unspecified: Secondary | ICD-10-CM | POA: Diagnosis not present

## 2022-10-24 DIAGNOSIS — Z818 Family history of other mental and behavioral disorders: Secondary | ICD-10-CM

## 2022-10-24 DIAGNOSIS — Z952 Presence of prosthetic heart valve: Secondary | ICD-10-CM

## 2022-10-24 DIAGNOSIS — Z8616 Personal history of COVID-19: Secondary | ICD-10-CM

## 2022-10-24 DIAGNOSIS — J9811 Atelectasis: Secondary | ICD-10-CM | POA: Diagnosis not present

## 2022-10-24 DIAGNOSIS — Z8673 Personal history of transient ischemic attack (TIA), and cerebral infarction without residual deficits: Secondary | ICD-10-CM

## 2022-10-24 DIAGNOSIS — I771 Stricture of artery: Secondary | ICD-10-CM | POA: Diagnosis not present

## 2022-10-24 HISTORY — DX: Presence of prosthetic heart valve: Z95.2

## 2022-10-24 HISTORY — DX: Dyspnea, unspecified: R06.00

## 2022-10-24 HISTORY — DX: Cerebral infarction, unspecified: I63.9

## 2022-10-24 HISTORY — DX: Postprocedural hypotension: I95.81

## 2022-10-24 HISTORY — PX: EXPLORATION POST OPERATIVE OPEN HEART: SHX5061

## 2022-10-24 HISTORY — PX: TEE WITHOUT CARDIOVERSION: SHX5443

## 2022-10-24 HISTORY — DX: COVID-19: U07.1

## 2022-10-24 HISTORY — DX: Acute posthemorrhagic anemia: D62

## 2022-10-24 HISTORY — DX: Presence of automatic (implantable) cardiac defibrillator: Z95.810

## 2022-10-24 HISTORY — DX: Prediabetes: R73.03

## 2022-10-24 HISTORY — DX: Presence of other specified devices: Z97.8

## 2022-10-24 HISTORY — PX: MITRAL VALVE REPLACEMENT: SHX147

## 2022-10-24 LAB — POCT I-STAT 7, (LYTES, BLD GAS, ICA,H+H)
Acid-base deficit: 7 mmol/L — ABNORMAL HIGH (ref 0.0–2.0)
Acid-base deficit: 5 mmol/L — ABNORMAL HIGH (ref 0.0–2.0)
Acid-base deficit: 2 mmol/L (ref 0.0–2.0)
Acid-base deficit: 4 mmol/L — ABNORMAL HIGH (ref 0.0–2.0)
Acid-base deficit: 6 mmol/L — ABNORMAL HIGH (ref 0.0–2.0)
Acid-base deficit: 4 mmol/L — ABNORMAL HIGH (ref 0.0–2.0)
Acid-base deficit: 5 mmol/L — ABNORMAL HIGH (ref 0.0–2.0)
Bicarbonate: 19.1 mmol/L — ABNORMAL LOW (ref 20.0–28.0)
Bicarbonate: 20 mmol/L (ref 20.0–28.0)
Bicarbonate: 21.6 mmol/L (ref 20.0–28.0)
Bicarbonate: 21.4 mmol/L (ref 20.0–28.0)
Bicarbonate: 19.3 mmol/L — ABNORMAL LOW (ref 20.0–28.0)
Bicarbonate: 21.1 mmol/L (ref 20.0–28.0)
Bicarbonate: 20.7 mmol/L (ref 20.0–28.0)
Calcium, Ion: 1.28 mmol/L (ref 1.15–1.40)
Calcium, Ion: 1 mmol/L — ABNORMAL LOW (ref 1.15–1.40)
Calcium, Ion: 1.02 mmol/L — ABNORMAL LOW (ref 1.15–1.40)
Calcium, Ion: 1.14 mmol/L — ABNORMAL LOW (ref 1.15–1.40)
Calcium, Ion: 1.06 mmol/L — ABNORMAL LOW (ref 1.15–1.40)
Calcium, Ion: 1 mmol/L — ABNORMAL LOW (ref 1.15–1.40)
Calcium, Ion: 1.16 mmol/L (ref 1.15–1.40)
HCT: 31 % — ABNORMAL LOW (ref 36.0–46.0)
HCT: 19 % — ABNORMAL LOW (ref 36.0–46.0)
HCT: 21 % — ABNORMAL LOW (ref 36.0–46.0)
HCT: 28 % — ABNORMAL LOW (ref 36.0–46.0)
HCT: 28 % — ABNORMAL LOW (ref 36.0–46.0)
HCT: 25 % — ABNORMAL LOW (ref 36.0–46.0)
HCT: 26 % — ABNORMAL LOW (ref 36.0–46.0)
Hemoglobin: 10.5 g/dL — ABNORMAL LOW (ref 12.0–15.0)
Hemoglobin: 6.5 g/dL — CL (ref 12.0–15.0)
Hemoglobin: 7.1 g/dL — ABNORMAL LOW (ref 12.0–15.0)
Hemoglobin: 9.5 g/dL — ABNORMAL LOW (ref 12.0–15.0)
Hemoglobin: 9.5 g/dL — ABNORMAL LOW (ref 12.0–15.0)
Hemoglobin: 8.5 g/dL — ABNORMAL LOW (ref 12.0–15.0)
Hemoglobin: 8.8 g/dL — ABNORMAL LOW (ref 12.0–15.0)
O2 Saturation: 100 %
O2 Saturation: 100 %
O2 Saturation: 100 %
O2 Saturation: 93 %
O2 Saturation: 98 %
O2 Saturation: 98 %
O2 Saturation: 98 %
Patient temperature: 35.8
Patient temperature: 37.1
Potassium: 3.5 mmol/L (ref 3.5–5.1)
Potassium: 3.6 mmol/L (ref 3.5–5.1)
Potassium: 4.5 mmol/L (ref 3.5–5.1)
Potassium: 4 mmol/L (ref 3.5–5.1)
Potassium: 3.8 mmol/L (ref 3.5–5.1)
Potassium: 3.7 mmol/L (ref 3.5–5.1)
Potassium: 4.9 mmol/L (ref 3.5–5.1)
Sodium: 145 mmol/L (ref 135–145)
Sodium: 139 mmol/L (ref 135–145)
Sodium: 142 mmol/L (ref 135–145)
Sodium: 144 mmol/L (ref 135–145)
Sodium: 145 mmol/L (ref 135–145)
Sodium: 145 mmol/L (ref 135–145)
Sodium: 144 mmol/L (ref 135–145)
TCO2: 20 mmol/L — ABNORMAL LOW (ref 22–32)
TCO2: 21 mmol/L — ABNORMAL LOW (ref 22–32)
TCO2: 23 mmol/L (ref 22–32)
TCO2: 23 mmol/L (ref 22–32)
TCO2: 20 mmol/L — ABNORMAL LOW (ref 22–32)
TCO2: 22 mmol/L (ref 22–32)
TCO2: 22 mmol/L (ref 22–32)
pCO2 arterial: 42.2 mmHg (ref 32–48)
pCO2 arterial: 34 mmHg (ref 32–48)
pCO2 arterial: 32 mmHg (ref 32–48)
pCO2 arterial: 35.7 mmHg (ref 32–48)
pCO2 arterial: 35.2 mmHg (ref 32–48)
pCO2 arterial: 38.3 mmHg (ref 32–48)
pCO2 arterial: 40.6 mmHg (ref 32–48)
pH, Arterial: 7.263 — ABNORMAL LOW (ref 7.35–7.45)
pH, Arterial: 7.379 (ref 7.35–7.45)
pH, Arterial: 7.437 (ref 7.35–7.45)
pH, Arterial: 7.38 (ref 7.35–7.45)
pH, Arterial: 7.348 — ABNORMAL LOW (ref 7.35–7.45)
pH, Arterial: 7.349 — ABNORMAL LOW (ref 7.35–7.45)
pH, Arterial: 7.316 — ABNORMAL LOW (ref 7.35–7.45)
pO2, Arterial: 449 mmHg — ABNORMAL HIGH (ref 83–108)
pO2, Arterial: 459 mmHg — ABNORMAL HIGH (ref 83–108)
pO2, Arterial: 401 mmHg — ABNORMAL HIGH (ref 83–108)
pO2, Arterial: 65 mmHg — ABNORMAL LOW (ref 83–108)
pO2, Arterial: 103 mmHg (ref 83–108)
pO2, Arterial: 107 mmHg (ref 83–108)
pO2, Arterial: 116 mmHg — ABNORMAL HIGH (ref 83–108)

## 2022-10-24 LAB — POCT I-STAT, CHEM 8
BUN: 21 mg/dL (ref 8–23)
BUN: 20 mg/dL (ref 8–23)
BUN: 20 mg/dL (ref 8–23)
BUN: 19 mg/dL (ref 8–23)
Calcium, Ion: 1.3 mmol/L (ref 1.15–1.40)
Calcium, Ion: 1.24 mmol/L (ref 1.15–1.40)
Calcium, Ion: 0.77 mmol/L — CL (ref 1.15–1.40)
Calcium, Ion: 1.05 mmol/L — ABNORMAL LOW (ref 1.15–1.40)
Chloride: 111 mmol/L (ref 98–111)
Chloride: 110 mmol/L (ref 98–111)
Chloride: 99 mmol/L (ref 98–111)
Chloride: 106 mmol/L (ref 98–111)
Creatinine, Ser: 0.8 mg/dL (ref 0.44–1.00)
Creatinine, Ser: 0.7 mg/dL (ref 0.44–1.00)
Creatinine, Ser: 0.8 mg/dL (ref 0.44–1.00)
Creatinine, Ser: 0.7 mg/dL (ref 0.44–1.00)
Glucose, Bld: 114 mg/dL — ABNORMAL HIGH (ref 70–99)
Glucose, Bld: 138 mg/dL — ABNORMAL HIGH (ref 70–99)
Glucose, Bld: 170 mg/dL — ABNORMAL HIGH (ref 70–99)
Glucose, Bld: 174 mg/dL — ABNORMAL HIGH (ref 70–99)
HCT: 29 % — ABNORMAL LOW (ref 36.0–46.0)
HCT: 30 % — ABNORMAL LOW (ref 36.0–46.0)
HCT: 24 % — ABNORMAL LOW (ref 36.0–46.0)
HCT: 21 % — ABNORMAL LOW (ref 36.0–46.0)
Hemoglobin: 9.9 g/dL — ABNORMAL LOW (ref 12.0–15.0)
Hemoglobin: 10.2 g/dL — ABNORMAL LOW (ref 12.0–15.0)
Hemoglobin: 8.2 g/dL — ABNORMAL LOW (ref 12.0–15.0)
Hemoglobin: 7.1 g/dL — ABNORMAL LOW (ref 12.0–15.0)
Potassium: 3.5 mmol/L (ref 3.5–5.1)
Potassium: 3.3 mmol/L — ABNORMAL LOW (ref 3.5–5.1)
Potassium: 4.4 mmol/L (ref 3.5–5.1)
Potassium: 4.6 mmol/L (ref 3.5–5.1)
Sodium: 145 mmol/L (ref 135–145)
Sodium: 143 mmol/L (ref 135–145)
Sodium: 159 mmol/L — ABNORMAL HIGH (ref 135–145)
Sodium: 141 mmol/L (ref 135–145)
TCO2: 23 mmol/L (ref 22–32)
TCO2: 20 mmol/L — ABNORMAL LOW (ref 22–32)
TCO2: 49 mmol/L — ABNORMAL HIGH (ref 22–32)
TCO2: 24 mmol/L (ref 22–32)

## 2022-10-24 LAB — CBC
HCT: 34.1 % — ABNORMAL LOW (ref 36.0–46.0)
HCT: 28.7 % — ABNORMAL LOW (ref 36.0–46.0)
HCT: 27.2 % — ABNORMAL LOW (ref 36.0–46.0)
HCT: 28.1 % — ABNORMAL LOW (ref 36.0–46.0)
Hemoglobin: 11 g/dL — ABNORMAL LOW (ref 12.0–15.0)
Hemoglobin: 9.7 g/dL — ABNORMAL LOW (ref 12.0–15.0)
Hemoglobin: 9.1 g/dL — ABNORMAL LOW (ref 12.0–15.0)
Hemoglobin: 9.7 g/dL — ABNORMAL LOW (ref 12.0–15.0)
MCH: 28.4 pg (ref 26.0–34.0)
MCH: 28 pg (ref 26.0–34.0)
MCH: 28.6 pg (ref 26.0–34.0)
MCH: 28.9 pg (ref 26.0–34.0)
MCHC: 32.3 g/dL (ref 30.0–36.0)
MCHC: 33.8 g/dL (ref 30.0–36.0)
MCHC: 33.5 g/dL (ref 30.0–36.0)
MCHC: 34.5 g/dL (ref 30.0–36.0)
MCV: 88.1 fL (ref 80.0–100.0)
MCV: 82.9 fL (ref 80.0–100.0)
MCV: 85.5 fL (ref 80.0–100.0)
MCV: 83.6 fL (ref 80.0–100.0)
Platelets: 241 10*3/uL (ref 150–400)
Platelets: 131 10*3/uL — ABNORMAL LOW (ref 150–400)
Platelets: 122 10*3/uL — ABNORMAL LOW (ref 150–400)
Platelets: 104 10*3/uL — ABNORMAL LOW (ref 150–400)
RBC: 3.87 MIL/uL (ref 3.87–5.11)
RBC: 3.46 MIL/uL — ABNORMAL LOW (ref 3.87–5.11)
RBC: 3.18 MIL/uL — ABNORMAL LOW (ref 3.87–5.11)
RBC: 3.36 MIL/uL — ABNORMAL LOW (ref 3.87–5.11)
RDW: 14.6 % (ref 11.5–15.5)
RDW: 16 % — ABNORMAL HIGH (ref 11.5–15.5)
RDW: 15.2 % (ref 11.5–15.5)
RDW: 15.8 % — ABNORMAL HIGH (ref 11.5–15.5)
WBC: 7.4 10*3/uL (ref 4.0–10.5)
WBC: 8.4 10*3/uL (ref 4.0–10.5)
WBC: 6.4 10*3/uL (ref 4.0–10.5)
WBC: 5.7 10*3/uL (ref 4.0–10.5)
nRBC: 0 % (ref 0.0–0.2)
nRBC: 0 % (ref 0.0–0.2)
nRBC: 0 % (ref 0.0–0.2)
nRBC: 0 % (ref 0.0–0.2)

## 2022-10-24 LAB — GLUCOSE, CAPILLARY
Glucose-Capillary: 123 mg/dL — ABNORMAL HIGH (ref 70–99)
Glucose-Capillary: 127 mg/dL — ABNORMAL HIGH (ref 70–99)
Glucose-Capillary: 133 mg/dL — ABNORMAL HIGH (ref 70–99)
Glucose-Capillary: 135 mg/dL — ABNORMAL HIGH (ref 70–99)
Glucose-Capillary: 143 mg/dL — ABNORMAL HIGH (ref 70–99)
Glucose-Capillary: 150 mg/dL — ABNORMAL HIGH (ref 70–99)
Glucose-Capillary: 154 mg/dL — ABNORMAL HIGH (ref 70–99)
Glucose-Capillary: 175 mg/dL — ABNORMAL HIGH (ref 70–99)
Glucose-Capillary: 175 mg/dL — ABNORMAL HIGH (ref 70–99)
Glucose-Capillary: 187 mg/dL — ABNORMAL HIGH (ref 70–99)

## 2022-10-24 LAB — PROTIME-INR
INR: 1.8 — ABNORMAL HIGH (ref 0.8–1.2)
INR: 2.1 — ABNORMAL HIGH (ref 0.8–1.2)
Prothrombin Time: 20.7 seconds — ABNORMAL HIGH (ref 11.4–15.2)
Prothrombin Time: 23.1 seconds — ABNORMAL HIGH (ref 11.4–15.2)

## 2022-10-24 LAB — SURGICAL PCR SCREEN
MRSA, PCR: NEGATIVE
Staphylococcus aureus: NEGATIVE

## 2022-10-24 LAB — POCT I-STAT EG7
Acid-Base Excess: 3 mmol/L — ABNORMAL HIGH (ref 0.0–2.0)
Bicarbonate: 27.8 mmol/L (ref 20.0–28.0)
Calcium, Ion: 1.03 mmol/L — ABNORMAL LOW (ref 1.15–1.40)
HCT: 17 % — ABNORMAL LOW (ref 36.0–46.0)
Hemoglobin: 5.8 g/dL — CL (ref 12.0–15.0)
O2 Saturation: 81 %
Potassium: 4.9 mmol/L (ref 3.5–5.1)
Sodium: 144 mmol/L (ref 135–145)
TCO2: 29 mmol/L (ref 22–32)
pCO2, Ven: 44.5 mmHg (ref 44–60)
pH, Ven: 7.404 (ref 7.25–7.43)
pO2, Ven: 46 mmHg — ABNORMAL HIGH (ref 32–45)

## 2022-10-24 LAB — COMPREHENSIVE METABOLIC PANEL
ALT: 25 U/L (ref 0–44)
AST: 28 U/L (ref 15–41)
Albumin: 3.7 g/dL (ref 3.5–5.0)
Alkaline Phosphatase: 105 U/L (ref 38–126)
Anion gap: 6 (ref 5–15)
BUN: 26 mg/dL — ABNORMAL HIGH (ref 8–23)
CO2: 23 mmol/L (ref 22–32)
Calcium: 9.8 mg/dL (ref 8.9–10.3)
Chloride: 114 mmol/L — ABNORMAL HIGH (ref 98–111)
Creatinine, Ser: 1.29 mg/dL — ABNORMAL HIGH (ref 0.44–1.00)
GFR, Estimated: 44 mL/min — ABNORMAL LOW (ref >60)
Glucose, Bld: 118 mg/dL — ABNORMAL HIGH (ref 70–99)
Potassium: 3.9 mmol/L (ref 3.5–5.1)
Sodium: 143 mmol/L (ref 135–145)
Total Bilirubin: 0.8 mg/dL (ref 0.3–1.2)
Total Protein: 6.7 g/dL (ref 6.5–8.1)

## 2022-10-24 LAB — COOXEMETRY PANEL
Carboxyhemoglobin: 3 % — ABNORMAL HIGH (ref 0.5–1.5)
Methemoglobin: <0.7 % (ref 0.0–1.5)
O2 Saturation: 79 %
Total hemoglobin: 9.7 g/dL — ABNORMAL LOW (ref 12.0–16.0)

## 2022-10-24 LAB — BASIC METABOLIC PANEL
Anion gap: 5 (ref 5–15)
BUN: 17 mg/dL (ref 8–23)
CO2: 21 mmol/L — ABNORMAL LOW (ref 22–32)
Calcium: 7.7 mg/dL — ABNORMAL LOW (ref 8.9–10.3)
Chloride: 116 mmol/L — ABNORMAL HIGH (ref 98–111)
Creatinine, Ser: 0.83 mg/dL (ref 0.44–1.00)
GFR, Estimated: >60 mL/min (ref >60)
Glucose, Bld: 132 mg/dL — ABNORMAL HIGH (ref 70–99)
Potassium: 4.7 mmol/L (ref 3.5–5.1)
Sodium: 142 mmol/L (ref 135–145)

## 2022-10-24 LAB — PREPARE RBC (CROSSMATCH)

## 2022-10-24 LAB — SARS CORONAVIRUS 2 BY RT PCR: SARS Coronavirus 2 by RT PCR: NEGATIVE

## 2022-10-24 LAB — APTT
aPTT: 38 seconds — ABNORMAL HIGH (ref 24–36)
aPTT: 41 seconds — ABNORMAL HIGH (ref 24–36)

## 2022-10-24 LAB — PLATELET COUNT: Platelets: 143 10*3/uL — ABNORMAL LOW (ref 150–400)

## 2022-10-24 LAB — HEMOGLOBIN AND HEMATOCRIT, BLOOD
HCT: 25.4 % — ABNORMAL LOW (ref 36.0–46.0)
Hemoglobin: 8.5 g/dL — ABNORMAL LOW (ref 12.0–15.0)

## 2022-10-24 LAB — MAGNESIUM: Magnesium: 3.7 mg/dL — ABNORMAL HIGH (ref 1.7–2.4)

## 2022-10-24 SURGERY — EXPLORATION POST OPERATIVE OPEN HEART
Anesthesia: General

## 2022-10-24 SURGERY — REPLACEMENT, MITRAL VALVE
Anesthesia: General | Site: Chest

## 2022-10-24 MED ORDER — VANCOMYCIN HCL IN DEXTROSE 1-5 GM/200ML-% IV SOLN: 1000.0000 mg | Freq: Once | INTRAVENOUS | Status: DC

## 2022-10-24 MED ORDER — SODIUM CHLORIDE 0.9 % IV SOLN: INTRAVENOUS | Status: DC

## 2022-10-24 MED ORDER — ASPIRIN 325 MG PO TBEC: 325.0000 mg | DELAYED_RELEASE_TABLET | Freq: Every day | ORAL | Status: DC

## 2022-10-24 MED ORDER — MIDAZOLAM HCL 2 MG/2ML IJ SOLN: 2.0000 mg | INTRAMUSCULAR | Status: DC | PRN

## 2022-10-24 MED ORDER — FENTANYL CITRATE (PF) 250 MCG/5ML IJ SOLN
INTRAMUSCULAR | Status: DC | PRN
  Administered 2022-10-24: 100 ug via INTRAVENOUS
  Administered 2022-10-24 (×2): 250 ug via INTRAVENOUS
  Administered 2022-10-24: 200 ug via INTRAVENOUS
  Administered 2022-10-24 (×2): 100 ug via INTRAVENOUS

## 2022-10-24 MED ORDER — LACTATED RINGERS IV SOLN: 500.0000 mL | Freq: Once | INTRAVENOUS | Status: DC | PRN

## 2022-10-24 MED ORDER — CHLORHEXIDINE GLUCONATE 0.12 % MT SOLN
15.0000 mL | Freq: Once | OROMUCOSAL | Status: AC
  Administered 2022-10-24: 15 mL via OROMUCOSAL
  Filled 2022-10-24: qty 15

## 2022-10-24 MED ORDER — METOPROLOL TARTRATE 12.5 MG HALF TABLET: 12.5000 mg | ORAL_TABLET | Freq: Two times a day (BID) | ORAL | Status: DC

## 2022-10-24 MED ORDER — 0.9 % SODIUM CHLORIDE (POUR BTL) OPTIME
TOPICAL | Status: DC | PRN
  Administered 2022-10-24: 6000 mL

## 2022-10-24 MED ORDER — SODIUM CHLORIDE 0.9 % IV SOLN: INTRAVENOUS | Status: DC | PRN

## 2022-10-24 MED ORDER — SODIUM CHLORIDE 0.45 % IV SOLN: INTRAVENOUS | Status: DC | PRN

## 2022-10-24 MED ORDER — CHLORHEXIDINE GLUCONATE 4 % EX LIQD: 30.0000 mL | CUTANEOUS | Status: DC

## 2022-10-24 MED ORDER — MORPHINE SULFATE (PF) 2 MG/ML IV SOLN: 1.0000 mg | INTRAVENOUS | Status: DC | PRN

## 2022-10-24 MED ORDER — FENTANYL CITRATE (PF) 250 MCG/5ML IJ SOLN
INTRAMUSCULAR | Status: DC | PRN
  Administered 2022-10-24: 50 ug via INTRAVENOUS
  Administered 2022-10-24 (×2): 100 ug via INTRAVENOUS

## 2022-10-24 MED ORDER — MILRINONE LACTATE IN DEXTROSE 20-5 MG/100ML-% IV SOLN
0.3750 ug/kg/min | INTRAVENOUS | Status: DC
  Administered 2022-10-25: 0.375 ug/kg/min via INTRAVENOUS
  Filled 2022-10-24: qty 100

## 2022-10-24 MED ORDER — LACTATED RINGERS IV SOLN: INTRAVENOUS | Status: DC | PRN

## 2022-10-24 MED ORDER — LACTATED RINGERS IV SOLN: INTRAVENOUS | Status: DC

## 2022-10-24 MED ORDER — MORPHINE SULFATE (PF) 2 MG/ML IV SOLN
1.0000 mg | INTRAVENOUS | Status: DC | PRN
  Administered 2022-10-26: 2 mg via INTRAVENOUS
  Filled 2022-10-24: qty 1

## 2022-10-24 MED ORDER — DEXMEDETOMIDINE HCL IN NACL 400 MCG/100ML IV SOLN
0.0000 ug/kg/h | INTRAVENOUS | Status: DC
  Administered 2022-10-24: .7 ug/kg/h via INTRAVENOUS

## 2022-10-24 MED ORDER — TRAMADOL HCL 50 MG PO TABS
50.0000 mg | ORAL_TABLET | ORAL | Status: DC | PRN
  Administered 2022-10-24 – 2022-10-25 (×3): 50 mg via ORAL
  Filled 2022-10-24 (×3): qty 1

## 2022-10-24 MED ORDER — PHENYLEPHRINE 80 MCG/ML (10ML) SYRINGE FOR IV PUSH (FOR BLOOD PRESSURE SUPPORT)
PREFILLED_SYRINGE | INTRAVENOUS | Status: DC | PRN
  Administered 2022-10-24: 160 ug via INTRAVENOUS

## 2022-10-24 MED ORDER — FENTANYL CITRATE (PF) 250 MCG/5ML IJ SOLN
INTRAMUSCULAR | Status: AC
  Filled 2022-10-24: qty 5

## 2022-10-24 MED ORDER — PHENYLEPHRINE 80 MCG/ML (10ML) SYRINGE FOR IV PUSH (FOR BLOOD PRESSURE SUPPORT)
PREFILLED_SYRINGE | INTRAVENOUS | Status: AC
  Filled 2022-10-24: qty 10

## 2022-10-24 MED ORDER — CEFAZOLIN SODIUM-DEXTROSE 2-4 GM/100ML-% IV SOLN: 2.0000 g | Freq: Three times a day (TID) | INTRAVENOUS | Status: DC

## 2022-10-24 MED ORDER — BISACODYL 5 MG PO TBEC: 10.0000 mg | DELAYED_RELEASE_TABLET | Freq: Every day | ORAL | Status: DC

## 2022-10-24 MED ORDER — EPHEDRINE 5 MG/ML INJ
INTRAVENOUS | Status: AC
  Filled 2022-10-24: qty 10

## 2022-10-24 MED ORDER — MIDAZOLAM HCL (PF) 5 MG/ML IJ SOLN
INTRAMUSCULAR | Status: DC | PRN
  Administered 2022-10-24: 2 mg via INTRAVENOUS
  Administered 2022-10-24: 1 mg via INTRAVENOUS
  Administered 2022-10-24: 5 mg via INTRAVENOUS
  Administered 2022-10-24: 2 mg via INTRAVENOUS

## 2022-10-24 MED ORDER — FAMOTIDINE IN NACL 20-0.9 MG/50ML-% IV SOLN
20.0000 mg | Freq: Two times a day (BID) | INTRAVENOUS | Status: AC
  Administered 2022-10-24 (×2): 20 mg via INTRAVENOUS
  Filled 2022-10-24 (×2): qty 50

## 2022-10-24 MED ORDER — 0.9 % SODIUM CHLORIDE (POUR BTL) OPTIME
TOPICAL | Status: DC | PRN
  Administered 2022-10-24: 4000 mL

## 2022-10-24 MED ORDER — SODIUM CHLORIDE 0.9 % IV SOLN: 250.0000 mL | INTRAVENOUS | Status: DC

## 2022-10-24 MED ORDER — DORZOLAMIDE HCL-TIMOLOL MAL 2-0.5 % OP SOLN
1.0000 [drp] | Freq: Two times a day (BID) | OPHTHALMIC | Status: DC
  Administered 2022-10-25 – 2022-10-30 (×11): 1 [drp] via OPHTHALMIC
  Filled 2022-10-24: qty 10

## 2022-10-24 MED ORDER — CHLORHEXIDINE GLUCONATE CLOTH 2 % EX PADS: 6.0000 | MEDICATED_PAD | Freq: Every day | CUTANEOUS | Status: DC

## 2022-10-24 MED ORDER — ACETAMINOPHEN 160 MG/5ML PO SOLN: 1000.0000 mg | Freq: Four times a day (QID) | ORAL | Status: DC

## 2022-10-24 MED ORDER — POTASSIUM CHLORIDE 10 MEQ/50ML IV SOLN
10.0000 meq | INTRAVENOUS | Status: AC
  Administered 2022-10-24 (×3): 10 meq via INTRAVENOUS

## 2022-10-24 MED ORDER — METOCLOPRAMIDE HCL 5 MG/ML IJ SOLN: 10.0000 mg | Freq: Four times a day (QID) | INTRAMUSCULAR | Status: DC

## 2022-10-24 MED ORDER — ACETAMINOPHEN 160 MG/5ML PO SOLN: 650.0000 mg | Freq: Once | ORAL | Status: DC

## 2022-10-24 MED ORDER — METOPROLOL TARTRATE 5 MG/5ML IV SOLN: 2.5000 mg | INTRAVENOUS | Status: DC | PRN

## 2022-10-24 MED ORDER — ATORVASTATIN CALCIUM 80 MG PO TABS
80.0000 mg | ORAL_TABLET | Freq: Every day | ORAL | Status: DC
  Administered 2022-10-24 – 2022-10-30 (×7): 80 mg via ORAL
  Filled 2022-10-24 (×6): qty 1

## 2022-10-24 MED ORDER — SODIUM CHLORIDE 0.9% FLUSH: 3.0000 mL | INTRAVENOUS | Status: DC | PRN

## 2022-10-24 MED ORDER — NITROGLYCERIN IN D5W 200-5 MCG/ML-% IV SOLN: 0.0000 ug/min | INTRAVENOUS | Status: DC

## 2022-10-24 MED ORDER — ACETAMINOPHEN 500 MG PO TABS
1000.0000 mg | ORAL_TABLET | Freq: Four times a day (QID) | ORAL | Status: DC
  Administered 2022-10-25 – 2022-10-26 (×5): 1000 mg via ORAL
  Filled 2022-10-24 (×7): qty 2

## 2022-10-24 MED ORDER — SODIUM CHLORIDE 0.9% FLUSH
3.0000 mL | Freq: Two times a day (BID) | INTRAVENOUS | Status: DC
  Administered 2022-10-25 – 2022-10-26 (×3): 3 mL via INTRAVENOUS

## 2022-10-24 MED ORDER — BISACODYL 5 MG PO TBEC
10.0000 mg | DELAYED_RELEASE_TABLET | Freq: Every day | ORAL | Status: DC
  Administered 2022-10-25 – 2022-10-26 (×2): 10 mg via ORAL
  Filled 2022-10-24 (×2): qty 2

## 2022-10-24 MED ORDER — ARTIFICIAL TEARS OPHTHALMIC OINT
TOPICAL_OINTMENT | OPHTHALMIC | Status: DC | PRN
  Administered 2022-10-24: 1 via OPHTHALMIC

## 2022-10-24 MED ORDER — TRAMADOL HCL 50 MG PO TABS: 50.0000 mg | ORAL_TABLET | ORAL | Status: DC | PRN

## 2022-10-24 MED ORDER — MIDAZOLAM HCL (PF) 10 MG/2ML IJ SOLN
INTRAMUSCULAR | Status: AC
  Filled 2022-10-24: qty 2

## 2022-10-24 MED ORDER — ROCURONIUM BROMIDE 10 MG/ML (PF) SYRINGE
PREFILLED_SYRINGE | INTRAVENOUS | Status: DC | PRN
  Administered 2022-10-24: 70 mg via INTRAVENOUS
  Administered 2022-10-24: 50 mg via INTRAVENOUS

## 2022-10-24 MED ORDER — SODIUM CHLORIDE 0.9% IV SOLUTION: Freq: Once | INTRAVENOUS | Status: DC

## 2022-10-24 MED ORDER — VASOPRESSIN 20 UNITS/100 ML INFUSION FOR SHOCK: 0.0000 [IU]/min | INTRAVENOUS | Status: DC

## 2022-10-24 MED ORDER — HEPARIN SODIUM (PORCINE) 1000 UNIT/ML IJ SOLN
INTRAMUSCULAR | Status: DC | PRN
  Administered 2022-10-24: 22000 [IU] via INTRAVENOUS

## 2022-10-24 MED ORDER — ALBUMIN HUMAN 5 % IV SOLN
250.0000 mL | INTRAVENOUS | Status: AC | PRN
  Administered 2022-10-24 (×2): 12.5 g via INTRAVENOUS
  Filled 2022-10-24: qty 250

## 2022-10-24 MED ORDER — CHLORHEXIDINE GLUCONATE CLOTH 2 % EX PADS
6.0000 | MEDICATED_PAD | Freq: Every day | CUTANEOUS | Status: DC
  Administered 2022-10-24 – 2022-10-26 (×3): 6 via TOPICAL

## 2022-10-24 MED ORDER — ACETAMINOPHEN 160 MG/5ML PO SOLN: 650.0000 mg | Freq: Once | ORAL | Status: AC

## 2022-10-24 MED ORDER — EPHEDRINE SULFATE-NACL 50-0.9 MG/10ML-% IV SOSY
PREFILLED_SYRINGE | INTRAVENOUS | Status: DC | PRN
  Administered 2022-10-24 (×2): 5 mg via INTRAVENOUS

## 2022-10-24 MED ORDER — DEXMEDETOMIDINE HCL IN NACL 400 MCG/100ML IV SOLN: 0.0000 ug/kg/h | INTRAVENOUS | Status: DC

## 2022-10-24 MED ORDER — ALBUMIN HUMAN 5 % IV SOLN: 250.0000 mL | INTRAVENOUS | Status: DC | PRN

## 2022-10-24 MED ORDER — DEXTROSE 50 % IV SOLN: 0.0000 mL | INTRAVENOUS | Status: DC | PRN

## 2022-10-24 MED ORDER — NOREPINEPHRINE 4 MG/250ML-% IV SOLN
INTRAVENOUS | Status: DC | PRN
  Administered 2022-10-24: 2 ug/min via INTRAVENOUS

## 2022-10-24 MED ORDER — NOREPINEPHRINE 4 MG/250ML-% IV SOLN: 0.0000 ug/min | INTRAVENOUS | Status: DC

## 2022-10-24 MED ORDER — NITROGLYCERIN IN D5W 200-5 MCG/ML-% IV SOLN
0.0000 ug/min | INTRAVENOUS | Status: DC
  Administered 2022-10-24: 5 ug/min via INTRAVENOUS
  Administered 2022-10-25: 100 ug/min via INTRAVENOUS
  Filled 2022-10-24 (×2): qty 250

## 2022-10-24 MED ORDER — MIDAZOLAM HCL 5 MG/5ML IJ SOLN
INTRAMUSCULAR | Status: DC | PRN
  Administered 2022-10-24: 2 mg via INTRAVENOUS

## 2022-10-24 MED ORDER — PROCHLORPERAZINE EDISYLATE 10 MG/2ML IJ SOLN: 10.0000 mg | Freq: Four times a day (QID) | INTRAMUSCULAR | Status: DC | PRN

## 2022-10-24 MED ORDER — MAGNESIUM SULFATE 4 GM/100ML IV SOLN: 4.0000 g | Freq: Once | INTRAVENOUS | Status: DC

## 2022-10-24 MED ORDER — METOPROLOL TARTRATE 5 MG/5ML IV SOLN
2.5000 mg | INTRAVENOUS | Status: DC | PRN
  Administered 2022-10-24 – 2022-10-25 (×2): 5 mg via INTRAVENOUS
  Filled 2022-10-24 (×2): qty 5

## 2022-10-24 MED ORDER — OXYCODONE HCL 5 MG PO TABS
5.0000 mg | ORAL_TABLET | ORAL | Status: DC | PRN
  Administered 2022-10-24: 5 mg via ORAL
  Filled 2022-10-24: qty 1

## 2022-10-24 MED ORDER — SODIUM CHLORIDE 0.9% FLUSH
10.0000 mL | Freq: Two times a day (BID) | INTRAVENOUS | Status: DC
  Administered 2022-10-24 – 2022-10-25 (×2): 10 mL

## 2022-10-24 MED ORDER — PHENYLEPHRINE 80 MCG/ML (10ML) SYRINGE FOR IV PUSH (FOR BLOOD PRESSURE SUPPORT)
PREFILLED_SYRINGE | INTRAVENOUS | Status: DC | PRN
  Administered 2022-10-24: 80 ug via INTRAVENOUS
  Administered 2022-10-24 (×3): 160 ug via INTRAVENOUS
  Administered 2022-10-24 (×3): 80 ug via INTRAVENOUS

## 2022-10-24 MED ORDER — PANTOPRAZOLE SODIUM 40 MG PO TBEC
40.0000 mg | DELAYED_RELEASE_TABLET | Freq: Every day | ORAL | Status: DC
  Administered 2022-10-26 – 2022-10-30 (×5): 40 mg via ORAL
  Filled 2022-10-24 (×5): qty 1

## 2022-10-24 MED ORDER — PHENYLEPHRINE HCL-NACL 20-0.9 MG/250ML-% IV SOLN: 0.0000 ug/min | INTRAVENOUS | Status: DC

## 2022-10-24 MED ORDER — MAGNESIUM SULFATE 4 GM/100ML IV SOLN
4.0000 g | Freq: Once | INTRAVENOUS | Status: AC
  Administered 2022-10-24: 4 g via INTRAVENOUS
  Filled 2022-10-24: qty 100

## 2022-10-24 MED ORDER — HEPARIN SODIUM (PORCINE) 1000 UNIT/ML IJ SOLN
INTRAMUSCULAR | Status: AC
  Filled 2022-10-24: qty 1

## 2022-10-24 MED ORDER — SODIUM CHLORIDE 0.9% FLUSH: 10.0000 mL | INTRAVENOUS | Status: DC | PRN

## 2022-10-24 MED ORDER — POTASSIUM CHLORIDE 10 MEQ/50ML IV SOLN: 10.0000 meq | INTRAVENOUS | Status: DC

## 2022-10-24 MED ORDER — METOPROLOL TARTRATE 12.5 MG HALF TABLET: 12.5000 mg | ORAL_TABLET | Freq: Once | ORAL | Status: DC

## 2022-10-24 MED ORDER — PROPOFOL 10 MG/ML IV BOLUS
INTRAVENOUS | Status: DC | PRN
  Administered 2022-10-24: 30 mg via INTRAVENOUS
  Administered 2022-10-24: 10 mg via INTRAVENOUS
  Administered 2022-10-24: 20 mg via INTRAVENOUS

## 2022-10-24 MED ORDER — SODIUM CHLORIDE 0.9% FLUSH: 3.0000 mL | Freq: Two times a day (BID) | INTRAVENOUS | Status: DC

## 2022-10-24 MED ORDER — INSULIN REGULAR(HUMAN) IN NACL 100-0.9 UT/100ML-% IV SOLN: INTRAVENOUS | Status: DC

## 2022-10-24 MED ORDER — ASPIRIN 325 MG PO TABS
325.0000 mg | ORAL_TABLET | Freq: Once | ORAL | Status: AC
  Administered 2022-10-24: 325 mg

## 2022-10-24 MED ORDER — DOCUSATE SODIUM 100 MG PO CAPS
200.0000 mg | ORAL_CAPSULE | Freq: Every day | ORAL | Status: DC
  Administered 2022-10-25 – 2022-10-26 (×2): 200 mg via ORAL
  Filled 2022-10-24 (×2): qty 2

## 2022-10-24 MED ORDER — FAMOTIDINE IN NACL 20-0.9 MG/50ML-% IV SOLN: 20.0000 mg | Freq: Two times a day (BID) | INTRAVENOUS | Status: DC

## 2022-10-24 MED ORDER — PHENYLEPHRINE HCL-NACL 20-0.9 MG/250ML-% IV SOLN
INTRAVENOUS | Status: DC | PRN
  Administered 2022-10-24: 20 ug/min via INTRAVENOUS

## 2022-10-24 MED ORDER — PLASMA-LYTE A IV SOLN
INTRAVENOUS | Status: DC | PRN
  Administered 2022-10-24: 500 mL via INTRAVASCULAR

## 2022-10-24 MED ORDER — METOPROLOL TARTRATE 25 MG/10 ML ORAL SUSPENSION: 12.5000 mg | Freq: Two times a day (BID) | ORAL | Status: DC

## 2022-10-24 MED ORDER — VANCOMYCIN HCL 1000 MG IV SOLR
INTRAVENOUS | Status: DC | PRN
  Administered 2022-10-24: 1000 mL

## 2022-10-24 MED ORDER — BISACODYL 10 MG RE SUPP: 10.0000 mg | Freq: Every day | RECTAL | Status: DC

## 2022-10-24 MED ORDER — ASPIRIN 325 MG PO TABS: 325.0000 mg | ORAL_TABLET | Freq: Once | ORAL | Status: DC

## 2022-10-24 MED ORDER — CHLORHEXIDINE GLUCONATE 0.12 % MT SOLN
15.0000 mL | OROMUCOSAL | Status: DC
  Filled 2022-10-24: qty 15

## 2022-10-24 MED ORDER — ACETAMINOPHEN 500 MG PO TABS: 1000.0000 mg | ORAL_TABLET | Freq: Four times a day (QID) | ORAL | Status: DC

## 2022-10-24 MED ORDER — ALBUMIN HUMAN 5 % IV SOLN: INTRAVENOUS | Status: DC | PRN

## 2022-10-24 MED ORDER — ASPIRIN 81 MG PO CHEW: 324.0000 mg | CHEWABLE_TABLET | Freq: Every day | ORAL | Status: DC

## 2022-10-24 MED ORDER — LATANOPROST 0.005 % OP SOLN
1.0000 [drp] | Freq: Every day | OPHTHALMIC | Status: DC
  Administered 2022-10-25 – 2022-10-29 (×5): 1 [drp] via OPHTHALMIC
  Filled 2022-10-24: qty 2.5

## 2022-10-24 MED ORDER — ACETAMINOPHEN 650 MG RE SUPP
650.0000 mg | Freq: Once | RECTAL | Status: AC
  Administered 2022-10-24: 650 mg via RECTAL

## 2022-10-24 MED ORDER — PROPOFOL 10 MG/ML IV BOLUS
INTRAVENOUS | Status: AC
  Filled 2022-10-24: qty 20

## 2022-10-24 MED ORDER — CALCIUM GLUCONATE-NACL 1-0.675 GM/50ML-% IV SOLN
1.0000 g | Freq: Once | INTRAVENOUS | Status: AC
  Administered 2022-10-24: 1000 mg via INTRAVENOUS
  Filled 2022-10-24: qty 50

## 2022-10-24 MED ORDER — CEFAZOLIN SODIUM-DEXTROSE 2-4 GM/100ML-% IV SOLN
2.0000 g | Freq: Three times a day (TID) | INTRAVENOUS | Status: AC
  Administered 2022-10-24 – 2022-10-26 (×6): 2 g via INTRAVENOUS
  Filled 2022-10-24 (×6): qty 100

## 2022-10-24 MED ORDER — MIDAZOLAM HCL 2 MG/2ML IJ SOLN
INTRAMUSCULAR | Status: AC
  Filled 2022-10-24: qty 2

## 2022-10-24 MED ORDER — ~~LOC~~ CARDIAC SURGERY, PATIENT & FAMILY EDUCATION
Freq: Once | Status: DC
  Filled 2022-10-24: qty 1

## 2022-10-24 MED ORDER — ACETAMINOPHEN 650 MG RE SUPP: 650.0000 mg | Freq: Once | RECTAL | Status: DC

## 2022-10-24 MED ORDER — PROTAMINE SULFATE 10 MG/ML IV SOLN
INTRAVENOUS | Status: DC | PRN
  Administered 2022-10-24: 220 mg via INTRAVENOUS

## 2022-10-24 MED ORDER — ORAL CARE MOUTH RINSE: 15.0000 mL | OROMUCOSAL | Status: DC | PRN

## 2022-10-24 MED ORDER — ORAL CARE MOUTH RINSE
15.0000 mL | OROMUCOSAL | Status: DC
  Administered 2022-10-24 (×4): 15 mL via OROMUCOSAL

## 2022-10-24 MED ORDER — CHLORHEXIDINE GLUCONATE 0.12 % MT SOLN
15.0000 mL | OROMUCOSAL | Status: AC
  Administered 2022-10-24: 15 mL via OROMUCOSAL

## 2022-10-24 MED ORDER — EPINEPHRINE HCL 5 MG/250ML IV SOLN IN NS: 0.5000 ug/min | INTRAVENOUS | Status: DC

## 2022-10-24 MED ORDER — METOCLOPRAMIDE HCL 5 MG/ML IJ SOLN
10.0000 mg | Freq: Four times a day (QID) | INTRAMUSCULAR | Status: AC
  Administered 2022-10-24 – 2022-10-25 (×4): 10 mg via INTRAVENOUS
  Filled 2022-10-24 (×4): qty 2

## 2022-10-24 MED ORDER — PHENYLEPHRINE 80 MCG/ML (10ML) SYRINGE FOR IV PUSH (FOR BLOOD PRESSURE SUPPORT)
PREFILLED_SYRINGE | INTRAVENOUS | Status: AC
  Filled 2022-10-24: qty 30

## 2022-10-24 MED ORDER — SODIUM CHLORIDE (PF) 0.9 % IJ SOLN
INTRAMUSCULAR | Status: AC
  Filled 2022-10-24: qty 10

## 2022-10-24 MED ORDER — MILRINONE LACTATE IN DEXTROSE 20-5 MG/100ML-% IV SOLN
INTRAVENOUS | Status: DC | PRN
  Administered 2022-10-24: .375 ug/kg/min via INTRAVENOUS

## 2022-10-24 MED ORDER — ETOMIDATE 2 MG/ML IV SOLN
INTRAVENOUS | Status: AC
  Filled 2022-10-24: qty 10

## 2022-10-24 MED ORDER — DOCUSATE SODIUM 100 MG PO CAPS: 200.0000 mg | ORAL_CAPSULE | Freq: Every day | ORAL | Status: DC

## 2022-10-24 MED ORDER — VANCOMYCIN HCL IN DEXTROSE 1-5 GM/200ML-% IV SOLN
1000.0000 mg | Freq: Once | INTRAVENOUS | Status: AC
  Administered 2022-10-24: 1000 mg via INTRAVENOUS
  Filled 2022-10-24: qty 200

## 2022-10-24 MED ORDER — INSULIN REGULAR(HUMAN) IN NACL 100-0.9 UT/100ML-% IV SOLN
INTRAVENOUS | Status: DC
  Administered 2022-10-24: 2.6 [IU]/h via INTRAVENOUS

## 2022-10-24 MED ORDER — PANTOPRAZOLE SODIUM 40 MG PO TBEC: 40.0000 mg | DELAYED_RELEASE_TABLET | Freq: Every day | ORAL | Status: DC

## 2022-10-24 MED ORDER — EPINEPHRINE HCL 5 MG/250ML IV SOLN IN NS
INTRAVENOUS | Status: DC | PRN
  Administered 2022-10-24: 5 ug/min via INTRAVENOUS

## 2022-10-24 MED ORDER — OXYCODONE HCL 5 MG PO TABS: 5.0000 mg | ORAL_TABLET | ORAL | Status: DC | PRN

## 2022-10-24 SURGICAL SUPPLY — 53 items
BLADE SURG 10 STRL SS (BLADE) IMPLANT
BLADE SURG 11 STRL SS (BLADE) IMPLANT
CANISTER SUCT 3000ML PPV (MISCELLANEOUS) ×1 IMPLANT
CLIP TI MEDIUM 24 (CLIP) IMPLANT
COVER SURGICAL LIGHT HANDLE (MISCELLANEOUS) IMPLANT
DRAPE CARDIOVASCULAR INCISE (DRAPES) ×1
DRAPE SRG 135.5X100X77XCV INC (DRAPES) IMPLANT
DRESSING AQUACEL AG SP 3.5X10 (GAUZE/BANDAGES/DRESSINGS) IMPLANT
DRSG AQUACEL AG ADV 3.5X10 (GAUZE/BANDAGES/DRESSINGS) IMPLANT
DRSG AQUACEL AG ADV 3.5X14 (GAUZE/BANDAGES/DRESSINGS) IMPLANT
DRSG AQUACEL AG SP 3.5X10 (GAUZE/BANDAGES/DRESSINGS)
ELECT CAUTERY BLADE 6.4 (BLADE) IMPLANT
ELECT REM PT RETURN 9FT ADLT (ELECTROSURGICAL) ×1
ELECTRODE REM PT RTRN 9FT ADLT (ELECTROSURGICAL) ×2 IMPLANT
GAUZE SPONGE 4X4 12PLY STRL (GAUZE/BANDAGES/DRESSINGS) ×1 IMPLANT
GLOVE ECLIPSE 7.5 STRL STRAW (GLOVE) ×2 IMPLANT
GLOVE SS BIOGEL STRL SZ 6 (GLOVE) IMPLANT
GOWN STRL REUS W/ TWL LRG LVL3 (GOWN DISPOSABLE) ×3 IMPLANT
GOWN STRL REUS W/ TWL XL LVL3 (GOWN DISPOSABLE) ×2 IMPLANT
GOWN STRL REUS W/TWL LRG LVL3 (GOWN DISPOSABLE) ×3
GOWN STRL REUS W/TWL XL LVL3 (GOWN DISPOSABLE) ×2
HEMOSTAT SURGICEL 2X14 (HEMOSTASIS) IMPLANT
INSERT FOGARTY XLG (MISCELLANEOUS) IMPLANT
KIT BASIN OR (CUSTOM PROCEDURE TRAY) ×1 IMPLANT
KIT TURNOVER KIT B (KITS) ×1 IMPLANT
NS IRRIG 1000ML POUR BTL (IV SOLUTION) ×5 IMPLANT
PAD ARMBOARD 7.5X6 YLW CONV (MISCELLANEOUS) ×2 IMPLANT
PENCIL BUTTON HOLSTER BLD 10FT (ELECTRODE) IMPLANT
POSITIONER HEAD DONUT 9IN (MISCELLANEOUS) ×1 IMPLANT
SPONGE T-LAP 18X18 ~~LOC~~+RFID (SPONGE) IMPLANT
SUT ETHIBOND 2 0 SH (SUTURE)
SUT ETHIBOND 2 0 SH 36X2 (SUTURE) IMPLANT
SUT MNCRL AB 4-0 PS2 18 (SUTURE) IMPLANT
SUT PROLENE 3 0 SH 1 (SUTURE) IMPLANT
SUT PROLENE 4 0 RB 1 (SUTURE)
SUT PROLENE 4 0 SH DA (SUTURE) IMPLANT
SUT PROLENE 4-0 RB1 .5 CRCL 36 (SUTURE) IMPLANT
SUT PROLENE 5 0 C 1 36 (SUTURE) IMPLANT
SUT PROLENE 6 0 C 1 30 (SUTURE) IMPLANT
SUT PROLENE 7 0 BV 1 (SUTURE) IMPLANT
SUT PROLENE 7 0 BV1 MDA (SUTURE) IMPLANT
SUT PROLENE 8 0 BV175 6 (SUTURE) IMPLANT
SUT SILK PERMA HAND 0-CT-1 (SUTURE) IMPLANT
SUT STEEL 6MS V (SUTURE) ×1 IMPLANT
SUT VIC AB 0 CTX 36 (SUTURE) ×2
SUT VIC AB 0 CTX36XBRD ANTBCTR (SUTURE) IMPLANT
SUT VIC AB 2-0 CT1 36 (SUTURE) ×2 IMPLANT
SUT VICRYL 4-0 PS2 18IN ABS (SUTURE) ×2 IMPLANT
SYSTEM SAHARA CHEST DRAIN ATS (WOUND CARE) ×1 IMPLANT
TAPE CLOTH SURG 4X10 WHT LF (GAUZE/BANDAGES/DRESSINGS) IMPLANT
TAPE PAPER 2X10 WHT MICROPORE (GAUZE/BANDAGES/DRESSINGS) IMPLANT
TOWEL GREEN STERILE (TOWEL DISPOSABLE) ×1 IMPLANT
WATER STERILE IRR 1000ML POUR (IV SOLUTION) ×2 IMPLANT

## 2022-10-24 SURGICAL SUPPLY — 95 items
ADAPTER CARDIO PERF ANTE/RETRO (ADAPTER) IMPLANT
BAG DECANTER FOR FLEXI CONT (MISCELLANEOUS) ×2 IMPLANT
BLADE CLIPPER SURG (BLADE) ×2 IMPLANT
BLADE STERNUM SYSTEM 6 (BLADE) ×2 IMPLANT
BLADE SURG 15 STRL LF DISP TIS (BLADE) IMPLANT
BLADE SURG 15 STRL SS (BLADE) ×2
BOOT SUTURE AID YELLOW STND (SUTURE) ×2 IMPLANT
CANISTER SUCT 3000ML PPV (MISCELLANEOUS) ×2 IMPLANT
CANN PRFSN 3/8XCNCT ST RT ANG (MISCELLANEOUS) ×2
CANNULA AORTIC ROOT 9FR (CANNULA) IMPLANT
CANNULA NON VENT 18FR 12 (CANNULA) IMPLANT
CANNULA NON VENT 22FR 12 (CANNULA) ×2 IMPLANT
CANNULA PRFSN 3/8XCNCT RT ANG (MISCELLANEOUS) IMPLANT
CANNULA SUMP PERICARDIAL (CANNULA) ×2 IMPLANT
CANNULA VEN MTL TIP RT (MISCELLANEOUS) ×2
CANNULA VRC MALB SNGL STG 36FR (MISCELLANEOUS) IMPLANT
CATH CPB KIT GERHARDT (MISCELLANEOUS) ×2 IMPLANT
CATH HEART VENT LEFT (CATHETERS) ×2 IMPLANT
CATH RETROPLEGIA CORONARY 14FR (CATHETERS) IMPLANT
CATH ROBINSON RED A/P 18FR (CATHETERS) ×6 IMPLANT
CATH THOR STR 32F SOFT 20 RADI (CATHETERS) ×2 IMPLANT
CATH THORACIC 28FR RT ANG (CATHETERS) ×2 IMPLANT
CLIP VESOCCLUDE MED 24/CT (CLIP) IMPLANT
CLIP VESOCCLUDE SM WIDE 24/CT (CLIP) IMPLANT
CNTNR URN SCR LID CUP LEK RST (MISCELLANEOUS) IMPLANT
CONNECTOR 1/2X3/8X1/2 3 WAY (MISCELLANEOUS) ×2
CONNECTOR 1/2X3/8X1/2 3WAY (MISCELLANEOUS) IMPLANT
CONT SPEC 4OZ STRL OR WHT (MISCELLANEOUS) ×2
CONTAINER PROTECT SURGISLUSH (MISCELLANEOUS) ×8 IMPLANT
COVER BACK TABLE 24X17X13 BIG (DRAPES) IMPLANT
DEFOGGER ANTIFOG KIT (MISCELLANEOUS) ×2 IMPLANT
DEVICE SUT CK QUICK LOAD INDV (Prosthesis & Implant Heart) IMPLANT
DEVICE SUT CK QUICK LOAD MINI (Prosthesis & Implant Heart) IMPLANT
DRAPE CV SPLIT W-CLR ANES SCRN (DRAPES) ×2 IMPLANT
DRAPE INCISE IOBAN 66X45 STRL (DRAPES) ×2 IMPLANT
DRAPE PERI GROIN 82X75IN TIB (DRAPES) ×2 IMPLANT
DRAPE SLUSH/WARMER DISC (DRAPES) IMPLANT
DRAPE WARM FLUID 44X44 (DRAPES) ×2 IMPLANT
DRSG AQUACEL AG ADV 3.5X14 (GAUZE/BANDAGES/DRESSINGS) ×2 IMPLANT
ELECT CAUTERY BLADE 6.4 (BLADE) ×2 IMPLANT
ELECT REM PT RETURN 9FT ADLT (ELECTROSURGICAL) ×4
ELECTRODE REM PT RTRN 9FT ADLT (ELECTROSURGICAL) ×4 IMPLANT
FELT TEFLON 1X6 (MISCELLANEOUS) ×2 IMPLANT
GAUZE SPONGE 4X4 12PLY STRL (GAUZE/BANDAGES/DRESSINGS) ×2 IMPLANT
GLOVE BIO SURGEON STRL SZ 6.5 (GLOVE) IMPLANT
GLOVE BIOGEL PI IND STRL 6.5 (GLOVE) IMPLANT
GLOVE BIOGEL PI IND STRL 7.5 (GLOVE) IMPLANT
GLOVE ECLIPSE 7.5 STRL STRAW (GLOVE) IMPLANT
GLOVE SS BIOGEL STRL SZ 6 (GLOVE) IMPLANT
GOWN STRL REUS W/ TWL LRG LVL3 (GOWN DISPOSABLE) ×12 IMPLANT
GOWN STRL REUS W/ TWL XL LVL3 (GOWN DISPOSABLE) IMPLANT
GOWN STRL REUS W/TWL LRG LVL3 (GOWN DISPOSABLE) ×8
GOWN STRL REUS W/TWL XL LVL3 (GOWN DISPOSABLE) ×6
INSERT FOGARTY 61MM (MISCELLANEOUS) IMPLANT
INSERT FOGARTY XLG (MISCELLANEOUS) IMPLANT
KIT BASIN OR (CUSTOM PROCEDURE TRAY) ×2 IMPLANT
KIT SUT CK MINI COMBO 4X17 (Prosthesis & Implant Heart) IMPLANT
KIT TURNOVER KIT B (KITS) ×2 IMPLANT
LEAD PACING MYOCARDI (MISCELLANEOUS) IMPLANT
LINE VENT (MISCELLANEOUS) IMPLANT
NS IRRIG 1000ML POUR BTL (IV SOLUTION) ×12 IMPLANT
ORGANIZER SUTURE GABBAY-FRATER (MISCELLANEOUS) IMPLANT
PACK E OPEN HEART (SUTURE) ×2 IMPLANT
PACK OPEN HEART (CUSTOM PROCEDURE TRAY) ×2 IMPLANT
PAD ARMBOARD 7.5X6 YLW CONV (MISCELLANEOUS) ×4 IMPLANT
PAD ELECT DEFIB RADIOL ZOLL (MISCELLANEOUS) ×2 IMPLANT
PENCIL BUTTON HOLSTER BLD 10FT (ELECTRODE) ×2 IMPLANT
POSITIONER HEAD DONUT 9IN (MISCELLANEOUS) ×2 IMPLANT
SET MPS 3-ND DEL (MISCELLANEOUS) IMPLANT
SUPPORT HEART JANKE-BARRON (MISCELLANEOUS) ×2 IMPLANT
SUT ETHIBOND 2 0 SH (SUTURE) IMPLANT
SUT ETHIBOND 3 0 SH 1 (SUTURE) IMPLANT
SUT MNCRL AB 4-0 PS2 18 (SUTURE) ×4 IMPLANT
SUT PROLENE 4 0 RB 1 (SUTURE) ×14
SUT PROLENE 4 0 SH DA (SUTURE) ×6 IMPLANT
SUT PROLENE 4-0 RB1 .5 CRCL 36 (SUTURE) ×4 IMPLANT
SUT PROLENE 5 0 RB 2 (SUTURE) IMPLANT
SUT STEEL 6MS V (SUTURE) ×2 IMPLANT
SUT STEEL SZ 6 DBL 3X14 BALL (SUTURE) ×4 IMPLANT
SUT VIC AB 0 CTX 36 (SUTURE) ×4
SUT VIC AB 0 CTX36XBRD ANTBCTR (SUTURE) ×4 IMPLANT
SUT VIC AB 2-0 CT1 27 (SUTURE) ×4
SUT VIC AB 2-0 CT1 TAPERPNT 27 (SUTURE) ×4 IMPLANT
SYR BULB IRRIG 60ML STRL (SYRINGE) IMPLANT
SYSTEM SAHARA CHEST DRAIN ATS (WOUND CARE) ×2 IMPLANT
TAPE CLOTH SURG 4X10 WHT LF (GAUZE/BANDAGES/DRESSINGS) IMPLANT
TAPE PAPER 2X10 WHT MICROPORE (GAUZE/BANDAGES/DRESSINGS) IMPLANT
TOWEL GREEN STERILE (TOWEL DISPOSABLE) ×2 IMPLANT
TOWEL GREEN STERILE FF (TOWEL DISPOSABLE) ×2 IMPLANT
TRAY FOLEY SLVR 16FR TEMP STAT (SET/KITS/TRAYS/PACK) ×2 IMPLANT
UNDERPAD 30X36 HEAVY ABSORB (UNDERPADS AND DIAPERS) ×2 IMPLANT
VALVE MITRAL SZ 29 (Prosthesis & Implant Heart) IMPLANT
VENT LEFT HEART 12002 (CATHETERS) ×2
VRC MALLEABLE SINGLE STG 36FR (MISCELLANEOUS) ×2
WATER STERILE IRR 1000ML POUR (IV SOLUTION) ×4 IMPLANT

## 2022-10-24 NOTE — Op Note (Signed)
     [address]       [place] [address]             [phone]       OPERATIVE REPORT  DATE OF OPERATION: 10/24/22  SURGEON: Coralie Common MD   ANESTHESIOLOGIST:  Laurie Panda MD  INDICATION FOR OPERATION:  Mediastinal bleeding following earlier MV replacement operation  OPERATION PERFORMED: Rexploration for bleeding. Redo sternotomy. Control of pericardial bleeder  FINDINGS: Hemodynamically stable. Area on right inferior pericardium with large vessel bleeder controlled with electrocautery. Moderate pericardial and pleural effusions evacuated  PROCEDURE: The patient was brought to the operating room theater and placed back on the table in the supine position.  Previous dressings were removed and the chest was prepped and draped in usual sterile fashion.  The previous median sternotomy incision was reopened and the sutures removed from the tissues.  Sternal wires were removed and mediastinal clot was evident.  Sternal retractor was placed and the effusion and clot removed an area on the inferior portion of the pericardium on the right appeared to have a large bleeding vessel.  This was controlled with the Bovie electrocautery. Bilateral pleural effusions were evacuated and the mediastinum was copiously irrigated and all cannulation sites examined and were hemostatic.  No other bleeding sites were noted.  Chest tubes were declotted and the sternum was reapproximated with interrupted stainless steel wires.  The presternal subcutaneous tissue and skin were closed multilayer absorbable suture.  Patient tolerated procedure well.

## 2022-10-24 NOTE — Consult Note (Addendum)
Advanced Heart Failure Team Consult Note   Primary Physician: Leone Haven, MD PCP-Cardiologist:  Sherren Mocha, MD  Reason for Consultation: Post-op management post MVR  HPI:    Melanie Ray is seen today for post-op management post MVR at the request of Dr. Lavonna Monarch with TCTS. 71 y.o. female with a hx of CAD s/p anterior STEMI 5/17 with PCI/DES x 2 to LAD, HFrEF/ischemic cardiomyopathy with LVEF at 20-25%, DM2, HTN, HLD, LBBB with CRT-D placement 01/02/22, OSA, and severe mitral regurgitation s/p mTEER with MitraClip 10/04/22.    She underwent successful transcatheter edge-to-edge mitral valve repair 10/04/22 with MitraClip NTW x1 and NT x1 reducing baseline 4+ mitral regurgitation to trace.    Post op echocardiogram 10/05/22. EF 25% with moderate to severe MR felt secondary to SLDA of the second MitraClip placement. Mean gradient at 57mHg with an average HR at 83bpm. RV normal    [facility] severe episodes of dyspnea with exertion. [facility] [place].  RHC showed well preserved hemodynamics despite severe MR/mod MS. TCTS  consulted. HF medications adjusted. GDMT limited by low filling pressures. Discharged on bb, arni, and mra.   She was admitted today for MVR. Underwent MVR with 29 mm Mosaic valve by Dr. WLavonna Monarchthis am. She returned to the OR shortly after for control of large vessel bleeder on right inferior pericardial. Moderate pericardial and pleural effusions evacuated.  She is seen in ICU post-op on 5 Epi, 0.375 milrinone, NE off. Has R IJ SWAN for hemodynamic monitoring.   SWAN #s CO-OX 79% CO 4.09 CI 2.54 PA 33/11 CVP 5   Review of Systems: Unable to assess. Sedated on vent.  Home Medications Prior to Admission medications   Medication Sig Start Date End Date Taking? Authorizing Provider  atorvastatin (LIPITOR) 80 MG tablet Take 1 tablet by mouth once daily Patient taking  differently: Take 80 mg by mouth daily. 11/20/21  Yes Kattie Santoyo, DShaune Pascal MD  carvedilol (COREG) 6.25 MG tablet Take 1 tablet (6.25 mg total) by mouth 2 (two) times daily with a meal. 10/16/22  Yes Clegg, Amy D, NP  dorzolamide-timolol (COSOPT) 22.3-6.8 MG/ML ophthalmic solution Place 1 drop into both eyes 2 (two) times daily.  01/17/15  Yes [provider]  furosemide (LASIX) 20 MG tablet Take 1 tablet (20 mg total) by mouth daily. 10/10/22  Yes MKathyrn DrownD, NP  isosorbide mononitrate (IMDUR) 30 MG 24 hr tablet Take 1 tablet by mouth once daily 09/05/22  Yes CSherren Mocha MD  latanoprost (XALATAN) 0.005 % ophthalmic solution Place 1 drop into both eyes at bedtime. 03/15/20  Yes [provider]  lumateperone tosylate (CAPLYTA) 42 MG capsule Take 42 mg by mouth at bedtime.   Yes [provider]  nitroGLYCERIN (NITROSTAT) 0.4 MG SL tablet DISSOLVE ONE TABLET UNDER THE TONGUE EVERY FIVE MINUTES AS NEEDED FOR CHEST PAIN. DO NOT EXCEED A TOTAL OF THREE DOSES IN 15 MINUTES 12/11/21  Yes CSherren Mocha MD  spironolactone (ALDACTONE) 25 MG tablet Take 1 tablet (25 mg total) by mouth daily. 10/16/22  Yes Clegg, Amy D, NP  traZODone (DESYREL) 150 MG tablet Take 150 mg by mouth at bedtime. 10/23/21  Yes [provider]  valsartan (DIOVAN) 80 MG tablet Take 80 mg by mouth daily.   Yes [provider]  venlafaxine XR (EFFEXOR-XR) 150 MG 24 hr capsule TAKE 2 CAPSULES BY MOUTH ONCE DAILY (NEEDS to  follow-up with psychiatry for further refills) Patient taking differently: Take 300 mg by mouth daily. 11/02/20  Yes Leone Haven, MD  aspirin EC 81 MG tablet Take 1 tablet (81 mg total) by mouth daily. Swallow whole. 10/16/22   Conrad [place], NP    Past Medical History: Past Medical History:  Diagnosis Date   AICD (automatic cardioverter/defibrillator) present    Anemia    Anxiety    Back pain    CAD (coronary artery disease) 04/11/2016   S/p ant STEMI 5/17:  LHC >> LAD proximal 80%, mid 80%, distal 50%, ostial D1 60%; LCx with LPDA lesion 30%; RCA Mild calcification with no significant stenosis in a medium caliber, nondominant RCA; LVEF is estimated at 45% with inferoapical and lateral wall akinesis >> PCI: PCI: 3.5 x 24 mm Promus DES to prox LAD, 2.5 x 12 mm Promus DES to mid LAD. // Myoview 7/21: EF 20, no ischemia; high risk    Chest pain    Chronic systolic CHF (congestive heart failure) ([place]) 03/21/2016   Echo 01/30/17: Diff HK, mild focal basal septal hypertrophy, EF 30-35, mild AI, MAC, mild MR // Echo 06/08/16: Mild focal basal septal hypertrophy, EF 25-30%, diff HK, ant-septal AK, Gr 1 DD, mild AI, MAC, mild MR, PASP 37 mmHg // Echo 03/18/16: EF 30-35%, ant-septal AK, Gr 1 DD, mild MR, severe LAE.   Constipation    COVID    February 2022 and Summer 2023 both mild cases   Depression    Dizziness    Dyspnea    Glaucoma    Gout    Heart disease    Heartburn    History of acute anterior wall MI 03/17/2016   History of heart attack    Hyperlipemia    Hypertension    Ischemic cardiomyopathy 10/02/2016   Refused ICD   Joint pain    Left bundle branch block    Myocardial infarction ([place])    2017   Nausea    Positive colorectal cancer screening using Cologuard test 03/01/2020   Pre-diabetes    S/P mitral valve clip implantation 10/04/2022   MitraClip NTWx1 and NTx1 with Dr. Burt Knack and Dr. Ali Lowe   Sleep apnea    SOB (shortness of breath)    Stroke ([place])    2021   Suicidal ideation 08/27/2018   Swelling    feet or legs    Past Surgical History: Past Surgical History:  Procedure Laterality Date   APPENDECTOMY     BIV ICD INSERTION CRT-D N/A 01/02/2022   Procedure: BIV ICD INSERTION CRT-D;  Surgeon: Vickie Epley, MD;  Location: [place] [facility];  Service: Cardiovascular;  Laterality: N/A;   CARDIAC CATHETERIZATION N/A 03/17/2016   Procedure: Left Heart Cath and Coronary Angiography;  Surgeon: Sherren Mocha, MD;  Location:  [place] [facility];  Service: Cardiovascular;  Laterality: N/A;   CARDIAC CATHETERIZATION N/A 03/17/2016   Procedure: Coronary Stent Intervention;  Surgeon: Sherren Mocha, MD;  Location: [place] [facility];  Service: Cardiovascular;  Laterality: N/A;   COLONOSCOPY WITH PROPOFOL N/A 08/13/2017   Procedure: COLONOSCOPY WITH PROPOFOL;  Surgeon: Jonathon Bellows, MD;  Location: [facility] ENDOSCOPY;  Service: Gastroenterology;  Laterality: N/A;   COLONOSCOPY WITH PROPOFOL N/A 03/29/2020   Procedure: COLONOSCOPY WITH PROPOFOL;  Surgeon: Lucilla Lame, MD;  Location[facility] ENDOSCOPY;  Service: Endoscopy;  Laterality: N/A;   MITRAL VALVE REPAIR N/A 10/04/2022   Procedure: MITRAL VALVE REPAIR;  Surgeon: Sherren Mocha, MD;  Location: [place]  [facility];  Service: Cardiovascular;  Laterality: N/A;   RIGHT HEART CATH N/A 10/15/2022   Procedure: RIGHT HEART CATH;  Surgeon: Sherren Mocha, MD;  Location: [place] [facility];  Service: Cardiovascular;  Laterality: N/A;   RIGHT/LEFT HEART CATH AND CORONARY ANGIOGRAPHY N/A 08/02/2022   Procedure: RIGHT/LEFT HEART CATH AND CORONARY ANGIOGRAPHY;  Surgeon: Jolaine Artist, MD;  Location: [place] [facility];  Service: Cardiovascular;  Laterality: N/A;   SMALL BOWEL REPAIR     TEE WITHOUT CARDIOVERSION N/A 08/02/2022   Procedure: TRANSESOPHAGEAL ECHOCARDIOGRAM (TEE);  Surgeon: Jolaine Artist, MD;  Location: [facility] ENDOSCOPY;  Service: Cardiovascular;  Laterality: N/A;   TEE WITHOUT CARDIOVERSION N/A 10/04/2022   Procedure: TRANSESOPHAGEAL ECHOCARDIOGRAM (TEE);  Surgeon: Sherren Mocha, MD;  Location: [place] [facility];  Service: Cardiovascular;  Laterality: N/A;   TONSILLECTOMY     UTERINE FIBROID SURGERY      Family History: Family History  Problem Relation Age of Onset   Anxiety disorder Mother    Paranoid behavior Mother    Hypertension Mother    Dementia Mother    High Cholesterol Mother    Diabetes Mother    Hyperlipidemia Mother    Depression Mother     Hypertension Father    High Cholesterol Father    Mood Disorder Sister    Stroke Sister    Anxiety disorder Maternal Aunt    Tuberculosis Paternal Grandfather    Drug abuse Cousin     Social History: Social History   Socioeconomic History   Marital status: Married    Spouse name: Cassadi Purdie   Number of children: 0   Years of education: BS in education   Highest education level: Not on file  Occupational History   Occupation:  Retired Education officer, museum  Tobacco Use   Smoking status: Former    Types: Cigarettes    Quit date: 04/13/1997    Years since quitting: 25.5   Smokeless tobacco: Never  Vaping Use   Vaping Use: Never used  Substance and Sexual Activity   Alcohol use: Not Currently   Drug use: No   Sexual activity: Not Currently  Other Topics Concern   Not on file  Social History Narrative   Lives in [place] with spouse.  No children.   Retired first [employer] for over 30 years ([place] for 10 years and then in [place] for over 20 years).   Left-handed   Lives in a two story home       Social Determinants of Health   Financial Resource Strain: Low Risk  (03/30/2022)   Overall Financial Resource Strain (CARDIA)    Difficulty of Paying Living Expenses: Not hard at all  Food Insecurity: No Food Insecurity (10/17/2022)   Hunger Vital Sign    Worried About Running Out of Food in the Last Year: Never true    Ran Out of Food in the Last Year: Never true  Transportation Needs: No Transportation Needs (10/17/2022)   PRAPARE - Hydrologist (Medical): No    Lack of Transportation (Non-Medical): No  Physical Activity: Not on file  Stress: No Stress Concern Present (03/30/2022)   [place]    Feeling of Stress : Not at all  Social Connections: Unknown (03/30/2022)   Social Connection and Isolation Panel [NHANES]    Frequency of Communication with Friends and  Family: Not on file    Frequency of Social Gatherings with  Friends and Family: Not on file    Attends Religious Services: Not on file    Active Member of Clubs or Organizations: Not on file    Attends Club or Organization Meetings: Not on file    Marital Status: Married    Allergies:  Allergies  Allergen Reactions   Tetracycline Swelling   Meperidine Nausea And Vomiting    (Demerol)Nausea   Entresto [Sacubitril-Valsartan] Other (See Comments)    Shortness of Breath    Objective:    Vital Signs:   Temp:  [97.6 F (36.4 C)] 97.6 F (36.4 C) (12/20 0731) Pulse Rate:  [67-72] 71 (12/20 0823) Resp:  [13-23] 18 (12/20 0823) BP: (136)/(64) 136/64 (12/20 0731) SpO2:  [96 %-100 %] 96 % (12/20 1307) FiO2 (%):  [50 %] 50 % (12/20 1307) Weight:  [59 kg] 59 kg (12/20 0731)    Weight change: Filed Weights   10/24/22 0731  Weight: 59 kg    Intake/Output:   Intake/Output Summary (Last 24 hours) at 10/24/2022 1516 Last data filed at 10/24/2022 1507 Gross per 24 hour  Intake 3430 ml  Output 1845 ml  Net 1585 ml      Physical Exam    General:  Sedated on vent HEENT: + ETT Neck: supple. JVP not elevated. R IJ SWAN Cor: PMI nondisplaced. Regular rate & rhythm. No rubs, gallops or murmurs. Sternal incision approximated. + CT Lungs: mechanical breath sounds Abdomen: nondistended, + BS Extremities: no cyanosis, clubbing, rash, edema Neuro: sedated   Telemetry   AV paced 60s  EKG    AV paced 73 bpm  Labs   Basic Metabolic Panel: Recent Labs  Lab 10/24/22 0721 10/24/22 0918 10/24/22 0922 10/24/22 1008 10/24/22 1022 10/24/22 1034 10/24/22 1103 10/24/22 1201 10/24/22 1204 10/24/22 1412  NA 143 145   < > 143   < > 144 159* 141 142 145  K 3.9 3.5   < > 3.3*   < > 4.9 4.4 4.6 4.5 3.8  CL 114* 111  --  110  --   --  99 106  --   --   CO2 23  --   --   --   --   --   --   --   --   --   GLUCOSE 118* 114*  --  138*  --   --  170* 174*  --   --   BUN 26* 21  --   20  --   --  20 19  --   --   CREATININE 1.29* 0.80  --  0.70  --   --  0.80 0.70  --   --   CALCIUM 9.8  --   --   --   --   --   --   --   --   --    < > = values in this interval not displayed.    Liver Function Tests: Recent Labs  Lab 10/24/22 0721  AST 28  ALT 25  ALKPHOS 105  BILITOT 0.8  PROT 6.7  ALBUMIN 3.7   No results for input(s): "LIPASE", "AMYLASE" in the last 168 hours. No results for input(s): "AMMONIA" in the last 168 hours.  CBC: Recent Labs  Lab 10/24/22 0721 10/24/22 0918 10/24/22 1131 10/24/22 1201 10/24/22 1204 10/24/22 1318 10/24/22 1412  WBC 7.4  --   --   --   --  8.4  --   HGB 11.0*   < >  8.5* 7.1* 7.1* 9.7* 9.5*  HCT 34.1*   < > 25.4* 21.0* 21.0* 28.7* 28.0*  MCV 88.1  --   --   --   --  82.9  --   PLT 241  --  143*  --   --  131*  --    < > = values in this interval not displayed.    Cardiac Enzymes: No results for input(s): "CKTOTAL", "CKMB", "CKMBINDEX", "TROPONINI" in the last 168 hours.  BNP: BNP (last 3 results) Recent Labs    05/07/22 1853 07/17/22 1458 10/14/22 1130  BNP 2,597.3* 2,184.7* 1,220.2*    ProBNP (last 3 results) Recent Labs    12/19/21 1107 10/01/22 1220 10/10/22 0923  PROBNP 2,342.0* 13,775* 9,776*     CBG: No results for input(s): "GLUCAP" in the last 168 hours.  Coagulation Studies: Recent Labs    10/24/22 1318  LABPROT 20.7*  INR 1.8*     Imaging   No results found.   Medications:     Current Medications:  [START ON 10/25/2022] acetaminophen  1,000 mg Oral Q6H   Or   [START ON 10/25/2022] acetaminophen (TYLENOL) oral liquid 160 mg/5 mL  1,000 mg Per Tube Q6H   acetaminophen (TYLENOL) oral liquid 160 mg/5 mL  650 mg Per Tube Once   Or   acetaminophen  650 mg Rectal Once   [START ON 10/25/2022] aspirin EC  325 mg Oral Daily   Or   [START ON 10/25/2022] aspirin  324 mg Per Tube Daily   aspirin  325 mg Per Tube Once   atorvastatin  80 mg Oral Daily   [START ON 10/25/2022]  bisacodyl  10 mg Oral Daily   Or   [START ON 10/25/2022] bisacodyl  10 mg Rectal Daily   chlorhexidine  15 mL Mouth/Throat NOW   [START ON 10/25/2022] docusate sodium  200 mg Oral Daily   dorzolamide-timolol  1 drop Both Eyes BID   latanoprost  1 drop Both Eyes QHS   metoCLOPramide (REGLAN) injection  10 mg Intravenous Q6H   metoprolol tartrate  12.5 mg Oral BID   Or   metoprolol tartrate  12.5 mg Per Tube BID   [START ON 10/26/2022] pantoprazole  40 mg Oral Daily   [START ON 10/25/2022] sodium chloride flush  3 mL Intravenous Q12H    Infusions:  sodium chloride     [START ON 10/25/2022] sodium chloride     sodium chloride     albumin human      ceFAZolin (ANCEF) IV     dexmedetomidine (PRECEDEX) IV infusion     famotidine (PEPCID) IV     insulin     lactated ringers     lactated ringers     lactated ringers     magnesium sulfate     nitroGLYCERIN     phenylephrine (NEO-SYNEPHRINE) Adult infusion     potassium chloride     vancomycin     vasopressin        Patient Profile   71 y.o. female with history of CAD, HFrEF/iCM, LBBB s/p CRT-D, OSA, severe MR, DM II. Recently underwent mTEER with MitraClip 11/30. The following day she developed severe MR secondary to SLDA of second MitraClip placement. Now admitted for high-risk MVR.  Assessment/Plan  1. Mitral regurgitation, severe - s/p mTEER with 2 clips on 10/04/22 - Post op echo EF 25% with moderate to severe MR felt secondary to SLDA of the second MitraClip placement. Mean gradient at 31mHg with an  average HR at 83bpm. RV normal - S/p MVR with 29 mm Mosaic valve by Dr. Lavonna Monarch. Returned to OR this afternoon for control of bleeding. CT output has slowed. Follow CBC. 2. Chronic systolic HF - initially felt due to ischemic CM. S/p Anterior STEMI 5/17. Now suspect MR major contributing factor. - Echo (8/19): EF 30-35%. - cMRI 1/23 EF 15% No LGE. RV ok. Severe LV dyssynchrony Moderate MR  - CRT-D placed 01/02/22 with LBBB  lead by Dr. Quentin Ore - Echo 07/17/22 EF 20-25% G2DD, RV ok. Severe central MR  - s/p mTEER with 2 clips on 10/04/22 - Echo 10/05/22: EF 25% with moderate to severe MR felt secondary to SLDA of the second MitraClip placement.  - RHC 12/23: RA 4, PA 47/20 (33) PCWP 13 CO 5.2 CI 3.1  PVR 2.1  - S/p MVR 10/24/22 - Currently on 5NE + 0.375 milrinone, CO-OX 79% and CI 2.5.  - CVP 5. No diuresis today. - GDMT once off pressor support - Hold off on beta blocker for now. 3. Coronary artery disease involving native coronary artery of native heart without angina pectoris - Hx of anterior STEMI in 5/17 tx with DES x 2 to the LAD.   - Myoview (7/21)  EF 20% no ischemia.   - No LGE on cMRI (1/23) - LHC 9/23 patent LAD stents - On aspirin + statin 4. LBBB - s/p CRT-D 3/23 with Dr. Quentin Ore  - EF has not improved  5. Post-op ABLA - Returned to OR this afternoon for control of bleeding as above - Monitor CBC   Length of Stay: 0  FINCH, LINDSAY N, PA-C  10/24/2022, 3:16 PM  Advanced Heart Failure Team Pager [phone] (M-F; 7a - 5p)  Please contact [facility] for night-coverage after hours (4p -7a ) and weekends on [email or web address]   Agree with above.   Patient well known to me from Clinic. 71 y/o woman with systolic HF due to NICM EF 25% with severe MR. Had mTEER but still had severe MR after clipping. Underwent MVR today. Had post-op bleeding and taken back to OR.   Now in ICU. Intubated/sedated. Swan numbers look good on low-dose pressors. AV paced on monitor. Chest tubes now dry  General: Intubated/sedated HEENT: normal + ETT Neck: supple. RIJ swan Carotids 2+ bilat; no bruits.  Cor: Sternal dressing ok RRR  Lungs: clear anteriorly  + CTs Abdomen: soft, nontender, nondistended. No hepatosplenomegaly. No bruits or masses. Hypoactive bowel sounds. Extremities: no cyanosis, clubbing, rash, edema Neuro: intubated/sedated  Hemodynamically stable immediately post-op from MVR. Luiz Blare numbers  reviewed personally. Wean drips slowly as tolerated.   Hopefully extubate in am.   Appreciate Dr. Nicholos Johns care.  CRITICAL CARE Performed by: Glori Bickers  Total critical care time: 45 minutes  Critical care time was exclusive of separately billable procedures and treating other patients.  Critical care was necessary to treat or prevent imminent or life-threatening deterioration.  Critical care was time spent personally by me (independent of midlevel providers or residents) on the following activities: development of treatment plan with patient and/or surrogate as well as nursing, discussions with consultants, evaluation of patient's response to treatment, examination of patient, obtaining history from patient or surrogate, ordering and performing treatments and interventions, ordering and review of laboratory studies, ordering and review of radiographic studies, pulse oximetry and re-evaluation of patient's condition.  Glori Bickers, MD  5:26 PM

## 2022-10-24 NOTE — Anesthesia Preprocedure Evaluation (Signed)
Anesthesia Evaluation  Patient identified by MRN, date of birth, ID band Patient awake    Reviewed: Allergy & Precautions, NPO status , Patient's Chart, lab work & pertinent test results, reviewed documented beta blocker date and time   History of Anesthesia Complications Negative for: history of anesthetic complications  Airway Mallampati: II  TM Distance: >3 FB Neck ROM: Full    Dental  (+) Teeth Intact, Dental Advisory Given   Pulmonary shortness of breath, sleep apnea , former smoker   breath sounds clear to auscultation       Cardiovascular hypertension, Pt. on home beta blockers and Pt. on medications + CAD, + Past MI, + Cardiac Stents and +CHF  + dysrhythmias (LBBB) + Cardiac Defibrillator + Valvular Problems/Murmurs MR  Rhythm:Regular Rate:Normal + Systolic murmurs Echo 0/53/97: 1. Left ventricular ejection fraction, by estimation, is 20 to 25%. The  left ventricle has severely decreased function. The left ventricular  internal cavity size was moderately dilated.   2. Right ventricular systolic function is moderately reduced. The right  ventricular size is normal.   3. Left atrial size was severely dilated. No left atrial/left atrial  appendage thrombus was detected.   4. Right atrial size was mildly dilated.   5. MV is decgenative. Mildly thickened. Posterior leaflet restricted. 3+  central MR. The mitral valve is degenerative. Moderate mitral valve  regurgitation.   6. Tricuspid valve regurgitation is mild to moderate.   7. The aortic valve is tricuspid. Aortic valve regurgitation is mild.   8. There is mild (Grade II) plaque.     Neuro/Psych  PSYCHIATRIC DISORDERS Anxiety Depression    CVA, No Residual Symptoms    GI/Hepatic negative GI ROS, Neg liver ROS,,,  Endo/Other  diabetes, Type 2    Renal/GU negative Renal ROS     Musculoskeletal negative musculoskeletal ROS (+)    Abdominal   Peds   Hematology  (+) Blood dyscrasia (Plavix), anemia Lab Results      Component                Value               Date                      WBC                      7.4                 10/24/2022                HGB                      10.5 (L)            10/24/2022                HCT                      31.0 (L)            10/24/2022                MCV                      88.1                10/24/2022  PLT                      241                 10/24/2022              Anesthesia Other Findings Day of surgery medications reviewed with the patient.  Reproductive/Obstetrics                             Anesthesia Physical Anesthesia Plan  ASA: 4  Anesthesia Plan: General   Post-op Pain Management:    Induction: Intravenous  PONV Risk Score and Plan: 3 and Ondansetron  Airway Management Planned: Oral ETT  Additional Equipment: Arterial line, CVP, PA Cath, TEE and Ultrasound Guidance Line Placement  Intra-op Plan:   Post-operative Plan: Post-operative intubation/ventilation  Informed Consent: I have reviewed the patients History and Physical, chart, labs and discussed the procedure including the risks, benefits and alternatives for the proposed anesthesia with the patient or authorized representative who has indicated his/her understanding and acceptance.     Dental advisory given  Plan Discussed with: CRNA  Anesthesia Plan Comments:        Anesthesia Quick Evaluation

## 2022-10-24 NOTE — Progress Notes (Addendum)
Melanie Deis, CRNA notified that the patient answered "yes" to the question of wish I was dead. Pt denies any plans for suicide.   I Also notified Dr.Moser of this.

## 2022-10-24 NOTE — Progress Notes (Signed)
     [address]       [place] [address]             [phone]       Post op with drainage from chest tubes more than expected from closure Will need to reexplore to rule out surgical bleeding Pt was on plavix preoperatively but off for 7 days

## 2022-10-24 NOTE — Transfer of Care (Signed)
Immediate Anesthesia Transfer of Care Note  Patient: Melanie Ray  Procedure(s) Performed: EXPLORATION POST OPERATIVE OPEN HEART  Patient Location: SICU  Anesthesia Type:General  Level of Consciousness: sedated and Patient remains intubated per anesthesia plan  Airway & Oxygen Therapy: Patient remains intubated per anesthesia plan and Patient placed on Ventilator (see vital sign flow sheet for setting)  Post-op Assessment: Report given to RN and Post -op Vital signs reviewed and stable  Post vital signs: Reviewed and stable  Last Vitals:  Vitals Value Taken Time  BP    Temp    Pulse 70 10/24/22 1530  Resp 12 10/24/22 1530  SpO2 100 % 10/24/22 1530  Vitals shown include unvalidated device data.  Last Pain:  Vitals:   10/24/22 0731  TempSrc: Oral  PainSc: 0-No pain         Complications: No notable events documented.

## 2022-10-24 NOTE — Interval H&P Note (Signed)
History and Physical Interval Note:  10/24/2022 7:41 AM  Melanie Ray  has presented today for surgery, with the diagnosis of SEVERE MR CHF.  The various methods of treatment have been discussed with the patient and family. After consideration of risks, benefits and other options for treatment, the patient has consented to  Procedure(s): MITRAL VALVE (MV) REPLACEMENT (N/A) PLACEMENT OF IMPELLA 5.5 LEFT VENTRICULAR ASSIST DEVICE (N/A) TRANSESOPHAGEAL ECHOCARDIOGRAM (TEE) (N/A) as a surgical intervention.  The patient's history has been reviewed, patient examined, no change in status, stable for surgery.  I have reviewed the patient's chart and labs.  Questions were answered to the patient's satisfaction.     Coralie Common

## 2022-10-24 NOTE — Anesthesia Procedure Notes (Signed)
Central Venous Catheter Insertion Performed by: Oleta Mouse, MD, anesthesiologist Start/End12/20/2023 7:22 AM, 10/24/2022 7:31 AM Patient location: Pre-op. Preanesthetic checklist: patient identified, IV checked, risks and benefits discussed, surgical consent, monitors and equipment checked, pre-op evaluation, timeout performed and anesthesia consent Position: supine Lidocaine 1% used for infiltration and patient sedated Hand hygiene performed  and maximum sterile barriers used  Catheter size: 9 Fr Total catheter length 10. MAC introducer Procedure performed using ultrasound guided technique. Ultrasound Notes:anatomy identified, needle tip was noted to be adjacent to the nerve/plexus identified, no ultrasound evidence of intravascular and/or intraneural injection and image(s) printed for medical record Attempts: 1 Following insertion, line sutured, dressing applied and Biopatch. Post procedure assessment: blood return through all ports, free fluid flow and no air  Patient tolerated the procedure well with no immediate complications.

## 2022-10-24 NOTE — Transfer of Care (Signed)
Immediate Anesthesia Transfer of Care Note  Patient: Melanie Ray  Procedure(s) Performed: MITRAL VALVE (MV) REPLACEMENT USING A 29 MM MEDTRONIC MOSAIC 310 (Chest) TRANSESOPHAGEAL ECHOCARDIOGRAM (TEE)  Patient Location: SICU  Anesthesia Type:General  Level of Consciousness: Patient remains intubated per anesthesia plan  Airway & Oxygen Therapy: Patient remains intubated per anesthesia plan and Patient placed on Ventilator (see vital sign flow sheet for setting)  Post-op Assessment: Report given to RN and Post -op Vital signs reviewed and stable  Post vital signs: Reviewed and stable  Last Vitals:  Vitals Value Taken Time  BP    Temp    Pulse    Resp    SpO2 96 % 10/24/22 1307    Last Pain:  Vitals:   10/24/22 0731  TempSrc: Oral  PainSc: 0-No pain         Complications: No notable events documented.

## 2022-10-24 NOTE — Anesthesia Procedure Notes (Signed)
Arterial Line Insertion Start/End12/20/2023 8:05 AM, 10/24/2022 8:20 AM Performed by: Leonor Liv, CRNA, CRNA  Patient location: Pre-op. Preanesthetic checklist: patient identified, IV checked, site marked, risks and benefits discussed, surgical consent, monitors and equipment checked, pre-op evaluation, timeout performed and anesthesia consent Lidocaine 1% used for infiltration and patient sedated Left was placed Catheter size: 20 G Hand hygiene performed  and maximum sterile barriers used  Allen's test indicative of satisfactory collateral circulation Attempts: 1 Procedure performed without using ultrasound guided technique. Following insertion, dressing applied and Biopatch. Patient tolerated the procedure well with no immediate complications.

## 2022-10-24 NOTE — Procedures (Signed)
Extubation Procedure Note  Patient Details:   Name: Melanie Ray DOB: 1951-01-13 MRN: [id number]   Airway Documentation:  Airway 8 mm (Active)  Secured at (cm) 23 cm 10/24/22 2000  Measured From Lips 10/24/22 2000  Secured Location Right 10/24/22 2000  Secured By Pink Tape 10/24/22 2000  Site Condition Dry 10/24/22 2000   Vent end date: 10/24/22 Vent end time: 2100   Evaluation  O2 sats: stable throughout Complications: No apparent complications Patient did tolerate procedure well. Bilateral Breath Sounds: Clear  NIF= -30 VC= .500. Placed patient on 5lpm Yes  Ulice Dash 10/24/2022, 9:04 PM

## 2022-10-24 NOTE — Progress Notes (Signed)
EVENING ROUNDS NOTE :     [address]       [place] [address]             [phone]                 Day of Surgery Procedure(s) (LRB): EXPLORATION POST OPERATIVE OPEN HEART (N/A)   Total Length of Stay:  LOS: 0 days  Events:   Stable since back Low CT output    BP 136/64   Pulse 71   Temp (!) 96.6 F (35.9 C)   Resp 12   Ht '5\' 3"'$  (1.6 m)   Wt 59 kg   SpO2 100%   BMI 23.03 kg/m   PAP: (28-37)/(9-14) 28/10 CVP:  [4 mmHg-5 mmHg] 4 mmHg CO:  [3.4 L/min-4.1 L/min] 3.4 L/min CI:  [2.1 L/min/m2-2.5 L/min/m2] 2.1 L/min/m2  Vent Mode: SIMV;PSV;PRVC FiO2 (%):  [50 %] 50 % Set Rate:  [12 bmp] 12 bmp Vt Set:  [420 mL-450 mL] 420 mL PEEP:  [5 cmH20] 5 cmH20 Pressure Support:  [10 cmH20] 10 cmH20 Plateau Pressure:  [16 cmH20] 16 cmH20   sodium chloride Stopped (10/24/22 1634)   [START ON 10/25/2022] sodium chloride     sodium chloride 10 mL/hr at 10/24/22 1700   albumin human 60 mL/hr at 10/24/22 1700    ceFAZolin (ANCEF) IV 200 mL/hr at 10/24/22 1700   dexmedetomidine (PRECEDEX) IV infusion 0.5 mcg/kg/hr (10/24/22 1700)   epinephrine 4 mcg/min (10/24/22 1700)   famotidine (PEPCID) IV Stopped (10/24/22 1632)   insulin 1.1 Units/hr (10/24/22 1700)   lactated ringers     lactated ringers     lactated ringers 20 mL/hr at 10/24/22 1700   magnesium sulfate 20 mL/hr at 10/24/22 1700   milrinone 0.375 mcg/kg/min (10/24/22 1700)   nitroGLYCERIN     norepinephrine (LEVOPHED) Adult infusion Stopped (10/24/22 1625)   phenylephrine (NEO-SYNEPHRINE) Adult infusion     potassium chloride 10 mEq (10/24/22 1711)   vancomycin     vasopressin      No intake/output data recorded.      Latest Ref Rng & Units 10/24/2022    3:25 PM 10/24/2022    2:12 PM 10/24/2022    1:18 PM  CBC  WBC 4.0 - 10.5 K/uL 6.4   8.4   Hemoglobin 12.0 - 15.0 g/dL 9.1  9.5  9.7   Hematocrit 36.0 - 46.0 % 27.2  28.0  28.7   Platelets 150 - 400 K/uL 122   131        Latest Ref  Rng & Units 10/24/2022    2:12 PM 10/24/2022   12:04 PM 10/24/2022   12:01 PM  BMP  Glucose 70 - 99 mg/dL   174   BUN 8 - 23 mg/dL   19   Creatinine 0.44 - 1.00 mg/dL   0.70   Sodium 135 - 145 mmol/L 145  142  141   Potassium 3.5 - 5.1 mmol/L 3.8  4.5  4.6   Chloride 98 - 111 mmol/L   106     ABG    Component Value Date/Time   PHART 7.348 (L) 10/24/2022 1412   PCO2ART 35.2 10/24/2022 1412   PO2ART 103 10/24/2022 1412   HCO3 19.3 (L) 10/24/2022 1412   TCO2 20 (L) 10/24/2022 1412   ACIDBASEDEF 6.0 (H) 10/24/2022 1412   O2SAT 79 10/24/2022 1620       Melodie Bouillon, MD 10/24/2022 5:54 PM

## 2022-10-24 NOTE — Anesthesia Procedure Notes (Signed)
Procedure Name: Intubation Date/Time: 10/24/2022 8:55 AM  Performed by: Leonor Liv, CRNAPre-anesthesia Checklist: Patient identified, Emergency Drugs available, Suction available and Patient being monitored Patient Re-evaluated:Patient Re-evaluated prior to induction Oxygen Delivery Method: Circle System Utilized Preoxygenation: Pre-oxygenation with 100% oxygen Induction Type: IV induction Ventilation: Mask ventilation without difficulty and Oral airway inserted - appropriate to patient size Laryngoscope Size: Mac and 3 Grade View: Grade II Tube type: Oral Tube size: 8.0 mm Number of attempts: 1 Airway Equipment and Method: Stylet and Oral airway Placement Confirmation: ETT inserted through vocal cords under direct vision, positive ETCO2 and breath sounds checked- equal and bilateral Secured at: 21 cm Tube secured with: Tape Dental Injury: Teeth and Oropharynx as per pre-operative assessment

## 2022-10-24 NOTE — Brief Op Note (Signed)
10/24/2022  8:34 AM  PATIENT:  [name]  71 y.o. female  PRE-OPERATIVE DIAGNOSIS:  SEVERE MR CHF  POST-OPERATIVE DIAGNOSIS:  * No post-op diagnosis entered *  PROCEDURE:  Procedure(s): MITRAL VALVE (MV) REPLACEMENT USING A 29 MM MEDTRONIC MOSAIC 310 (N/A) TRANSESOPHAGEAL ECHOCARDIOGRAM (TEE) (N/A)  SURGEON:  Surgeon(s) and Role:  Coralie Common, MD - Primary  PHYSICIAN ASSISTANT: Wynelle Beckmann PA-C  ASSISTANTS: Farrel Gordon RNFA  ANESTHESIA:   general  EBL:  1320 mL   BLOOD ADMINISTERED: 630 CC PRBC  DRAINS:  Mediastinal and pleural chest tube    LOCAL MEDICATIONS USED:  NONE  SPECIMEN:  Source of Specimen:  Mitral valve leaflets  DISPOSITION OF SPECIMEN:  PATHOLOGY  COUNTS CORRECT:  YES  DICTATION: .Dragon Dictation  PLAN OF CARE: Admit to inpatient   PATIENT DISPOSITION:  ICU - intubated and hemodynamically stable.   Delay start of Pharmacological VTE agent (>24hrs) due to surgical blood loss or risk of bleeding: yes

## 2022-10-24 NOTE — Progress Notes (Signed)
  Echocardiogram Echocardiogram Transesophageal has been performed.  Bobbye Charleston 10/24/2022, 10:28 AM

## 2022-10-24 NOTE — Op Note (Signed)
CARDIOVASCULAR SURGERY OPERATIVE NOTE  10/24/2022 [name] [id number]  Surgeon:  Everlena Cooper, MD  First Assistant: Wynelle Beckmann [facility]                               An experienced assistant was required given the complexity of this surgery and the standard of surgical care. The assistant was needed for exposure, dissection, suctioning, retraction of delicate tissues and sutures, instrument exchange and for overall help during this procedure.     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    Mitral Valve Replacement using a 44m Mosaic  valve.([id number]  Anesthesia:  General Endotracheal   Clinical History/Surgical Indication:  71yo with NYHA class 3-4 symptoms of acute on chronic systolic CHF with now severe MR from potential chord rupture following mitral clip. Pt with reasonable Right heart function and hemodynamics on work up    Preparation:  The patient was seen in the preoperative holding area and the correct patient, correct operation were confirmed with the patient after reviewing the medical record and catheterization. The consent was signed by me. Preoperative antibiotics were given. A pulmonary arterial line and radial arterial line were placed by the anesthesia team. The patient was taken back to the operating room and positioned supine on the operating room table. After being placed under general endotracheal anesthesia by the anesthesia team a foley catheter was placed. The neck, chest, abdomen, and both legs were prepped with betadine soap and solution and draped in the usual sterile manner. A surgical time-out was taken and the correct patient and operative procedure were confirmed with the nursing and anesthesia staff.   Pre-bypass TEE:   Complete TEE assessment was performed by Dr. CLaurie Panda  Severe MR  from area of previous clip with eccentric jet. EF 25%   Post-bypass TEE: No MR with well seated MV. Mean gradient 34mg. Closure of LA appendage and procedural ASD completed. EF 25%  Normal functioning prosthetic aortic valve with no perivalvular leak or regurgitation through the valve. Left ventricular function preserved.  mitral regurgitation.  Findings: Medial Mitral clip with loss of hold on posterior leaflet at P1        Operation: The patient was brought to the operating room theater and placed on the table in the supine position.After general anesthesia was obtained with use of an endotracheal tube chest abdomen and legs were prepped and draped in usual sterile fashion.  Median sternotomy incision was then created in the sternum as well as sternal saw.  The pericardial well was developed.  Heparin was delivered. The aorta was cannulated with a 20 FrPakistanrterial cannula and a 3635rench straight venous cannula was placed in the right atrial appendage and directed towards the inferior vena cava.  28 right angle cannulas placed in the superior vena cava.  With adequate confirmation of anticoagulation cardiopulmonary bypass was instituted.  Antegrade and retrograde cardioplegia catheters were placed in the ascending or noncoronary satisfactorily.  With adequate bypass aortic cross-clamp was placed and cold [place] blood cardioplegia was delivered antegrade and retrograde for total of 1200 cc.  A reanimation dose of 400 cc was given just prior to cross-clamp removal. The left atrium was opened and the intra-atrial groove and excellent exposure of the mitral valve was obtained.  Left atrial appendage was  oversewn with a linear 4-0 Prolene suture.  The mitral valve was photographed and then resected and utilizing 2-0 Ethibond sutures with pledgets on the atrial side a 29 mm Mosaic tissue valve was secured.  This was performed utilizing the core knot system. Patient then had the left atrium  closed over ventricular sump after the procedural ASD was closed with a interrupted 4-0 Prolene suture.  With the patient in the headdown position aortic cross-clamp was removed and multiple de-airing maneuvers performed.  Ventricular and atrial pacing wires were placed and brought out inferior stab wounds and secured.  The patient was then weaned from cardiopulmonary bypass on inotropic support.  Adequate hemodynamics protamine was delivered the patient was decannulated and sites oversewn were necessary.  Chest tubes were out through inferior stab wounds and secured.  With adequate hemostasis the sternum was reapproximated with interrupted 3 steel wire and the presternal subcutaneous tissue and skin were closed in multiple layers observable suture.  Sterile dressings were applied.  Aortic cross-clamp time was 66 minutes and cardiopulmonary bypass time was 90 minutes with an expected blood loss of 1320 cc.

## 2022-10-24 NOTE — Progress Notes (Signed)
Patient arrived to 2H20 at 1300. BP labile. Received total of 3 albumins rapidly. Vasopressors titrated appropriately. 450 mL bright red drainage from chest tubes. Weldner MD at bedside. 2 units up from blood bank, both verified. First unit given emergently and second sent in cooler with CRNA to OR. Patient taken back to OR emergently.

## 2022-10-24 NOTE — Hospital Course (Addendum)
History of Present Illness:     Melanie Ray is a 71 yo female with ischemic cardiomyopathy following MI treated aggressively medically and with CRRT/defibrillator who was recently treated for severe mitral regurgitation with mitral clip 3 weeks ago. Procedurally excellent result however following day had loud murmur and recurrent mitral regurgitation. Patient now with CHF symptoms worse than prior to mitral clip with severe dyspnea on exertion with minimal exertion. No chest pain. Work up with echo now with EF 20-25% and severe mitral regurgitation with mean gradient of 67mHg. There is very good Right heart function. She has no evidence of SLD of clip but etiology may be secondary to injured chord/rupture. She had RHC yesterday which has surprisingly adequate hemodynamics considering her LV function.   Dr. WLavonna Monarchreviewed the patient's studies and determined surgical intervention would provide this patient the best long term treatment. Dr. WLavonna Monarchreviewed treatment options as well as the risks and benefits of surgery. Ms. SCosbywas agreeable to proceed with surgery.  [facility] She underwent mitral valve replacement utilizing a 283mMosaic valve. She tolerated the procedure well and was transferred to the SICU in stable condition. When transferred to the SICU she was noted to have more post op drainage from the chest tubes than expected. She was brought back to the operating room on 10/24/22 and underwent mediastinal reexploration. A pericardial bleeder was identified and controlled with electrocautery. She tolerated the procedure well and was again transferred to the SICU in stable condition. She was extubated the night of surgery without complication. Drips were weaned as hemodynamics tolerated. Her arterial line, swan ganz catheter and epicardial pacing wires were removed on POD1 without  complication. She was volume overload and diuresed appropriately. Coumadin '2mg'$  daily was started for her mitral valve. She was followed by cardiology for her chronic systolic heart failure. She was started on Lopressor but this was discontinued and she was restarted on home Coreg 12.'5mg'$  BID and Irbesartan 37.'5mg'$  QD. Her chest tubes were removed without complication. Her INR was 1.8, 2.1 and 1.5 so Coumadin was increased to 2.'5mg'$  daily.  She was felt stable for transfer to the progressive unit. Cardiology started her on [name], Spironolactone and Lasix for heart failure. Patient was given several laxatives and had loose stools. Imodium was given, stool softeners were stopped, and with time, loose stools stopped. INR was slow to increase. It was 1.5 on 12/24. Coumadin was increased to 4 mg daily. INR 12/25 was up to 1.8. Coumadin was decreased to '3mg'$  daily. INR on 12/26 was 1.9, Coumadin '3mg'$  daily was continued at discharge. Her H/H decreased from 9.3/28.6 to 8.5/25, iron supplement was started and H/H on follow up CBC had increased to 9.1/27.5. Home health was recommended by PT which was arranged accordingly. Her ambulation was progressing well. Sternal incision was healing well without signs of infection. She was felt surgically stable for discharge.

## 2022-10-24 NOTE — Anesthesia Postprocedure Evaluation (Signed)
Anesthesia Post Note  Patient: Melanie Ray  Procedure(s) Performed: MITRAL VALVE (MV) REPLACEMENT USING A 29 MM MEDTRONIC MOSAIC 310 (Chest) TRANSESOPHAGEAL ECHOCARDIOGRAM (TEE)     Patient location during evaluation: SICU Anesthesia Type: General Level of consciousness: sedated Pain management: pain level controlled Vital Signs Assessment: post-procedure vital signs reviewed and stable Respiratory status: patient remains intubated per anesthesia plan Cardiovascular status: stable Postop Assessment: no apparent nausea or vomiting Anesthetic complications: no  No notable events documented.  Last Vitals:  Vitals:   10/24/22 1600 10/24/22 1615  BP:    Pulse: 68 70  Resp: 12 11  Temp: (!) 34.5 C (!) 34.6 C  SpO2: 100% 100%    Last Pain:  Vitals:   10/24/22 1600  TempSrc: Core  PainSc:                  [name]

## 2022-10-24 NOTE — Consult Note (Signed)
NAME:  Melanie Ray, MRN:  [id number], DOB:  August 02, 1951, LOS: 0 ADMISSION DATE:  10/24/2022, CONSULTATION DATE:  10/24/22 REFERRING MD:  Lavonna Monarch, CHIEF COMPLAINT:  s/p MVR   History of Present Illness:  71 yo PMH MR s/p mitraclip x2, HFrEF, HTN, HLD, CAD s/p PCI/DES, LBBB s/p CRT-D presented 10/24/22 for planned MVR. Total pump time 90 min Xclamp 66 min. EBL 1337m, received  6616mcell saver.  Returned to ICU intubated sedated post operative as per plan. Post op course c/b hypotension, high output from chest tube. Patient taken for re-exploration and was found to have bleeding pericardial vessel, which was cauterized with good hemostasis. EBL 34086mReceived 1 PRBC 1 cryo 1 plt 170m46mll saver   Returns to ICU intubated sedated per plan.   PCCM consulted for post op care   Pertinent  Medical History  Severe MR HFrEF/NICM CAD HTN HLD LBBB  S/p CRT-D  Significant Hospital Events: Including procedures, antibiotic start and stop dates in addition to other pertinent events   12/20 MVR. Re-exploration for mediastinal bleeding. Pericardial vessel cauterized.   Interim History / Subjective:  Returns to ICU intubate sedated. Reduced pressor requirement and decreased chest tube output   Objective   Blood pressure 136/64, pulse 74, temperature 97.6 F (36.4 C), temperature source Oral, resp. rate 15, height '5\' 3"'$  (1.6 m), weight 59 kg, SpO2 100 %.    Vent Mode: SIMV;PSV;PRVC FiO2 (%):  [50 %] 50 % Set Rate:  [12 bmp] 12 bmp Vt Set:  [420 mL-450 mL] 420 mL PEEP:  [5 cmH20] 5 cmH20 Pressure Support:  [10 cmH20] 10 cmH20 Plateau Pressure:  [16 cmH20] 16 cmH20   Intake/Output Summary (Last 24 hours) at 10/24/2022 1540 Last data filed at 10/24/2022 1515 Gross per 24 hour  Intake 4000 ml  Output 2545 ml  Net 1455 ml   Filed Weights   10/24/22 0731  Weight: 59 kg    Examination: General: Chronically and critically ill appearing elderly F intubated sedated NAD  HENT:  NCAT periorbital edema. ETT secure anicteric sclera  Lungs: Some course anterior sounds. Mechanically ventilated. Symmetrical chest expansion  Cardiovascular: paced rhythm. Cap refill brisk. Chest tube with bloody output  Abdomen: soft ndnt + bowel sounds  Extremities: noa cute deformity. BLE edema  Neuro: sedated, 2mm 48mils  GU: foley   Resolved Hospital Problem list     Assessment & Plan:  PCCM is consulted 10/24/22 for post operative care  - POD 0 MVR + take back for re-exploration, vessel cauterization   Severe MR with prior mitraclip x2, now s/p MVR Mediastinal bleeding s/p re-exploration and vessel cautery  Expected post bypass vasoplegia / hypotension  P -chest tube per CVTS, follow output closely - Temp pacer is off  -complete txa  -complete ppx abx -insulin gtt  -statin, ASA -wean pressors as able , follow cardiac indices, PRN volume per protocol  -passive rewarm  Expected post operative mechanical ventilation // ETT in place ?hx OSA  P -CXR, Abg -hopefully rapid wean -VAP, pulm hygiene   HFrEF -Adv HF consulted by primary team  -holding home meds at present   Expected post op ABLA  Chronic anemia P -follow CBC  -transfusion per primary team   CAD HTN HLD LBBB s/p CRT-D P -ASA, Statin -holding home antihypertensives   Hypokalemia -replace, follow lytes    Anxiety ?OCD -rsume home meds when extubated  Best Practice (right click and "Reselect all SmartList Selections" daily)   Diet/type: NPO  DVT prophylaxis: not indicated GI prophylaxis: H2B and PPI Lines: Central line and Arterial Line Foley:  Yes, and it is still needed Code Status:  full code Last date of multidisciplinary goals of care discussion [--]  Labs   CBC: Recent Labs  Lab 10/24/22 0721 10/24/22 0918 10/24/22 1131 10/24/22 1201 10/24/22 1204 10/24/22 1318 10/24/22 1412  WBC 7.4  --   --   --   --  8.4  --   HGB 11.0*   < > 8.5* 7.1* 7.1* 9.7* 9.5*  HCT 34.1*   < >  25.4* 21.0* 21.0* 28.7* 28.0*  MCV 88.1  --   --   --   --  82.9  --   PLT 241  --  143*  --   --  131*  --    < > = values in this interval not displayed.    Basic Metabolic Panel: Recent Labs  Lab 10/24/22 0721 10/24/22 0918 10/24/22 0922 10/24/22 1008 10/24/22 1022 10/24/22 1034 10/24/22 1103 10/24/22 1201 10/24/22 1204 10/24/22 1412  NA 143 145   < > 143   < > 144 159* 141 142 145  K 3.9 3.5   < > 3.3*   < > 4.9 4.4 4.6 4.5 3.8  CL 114* 111  --  110  --   --  99 106  --   --   CO2 23  --   --   --   --   --   --   --   --   --   GLUCOSE 118* 114*  --  138*  --   --  170* 174*  --   --   BUN 26* 21  --  20  --   --  20 19  --   --   CREATININE 1.29* 0.80  --  0.70  --   --  0.80 0.70  --   --   CALCIUM 9.8  --   --   --   --   --   --   --   --   --    < > = values in this interval not displayed.   GFR: Estimated Creatinine Clearance: 52.6 mL/min (by C-G formula based on SCr of 0.7 mg/dL). Recent Labs  Lab 10/24/22 0721 10/24/22 1318  WBC 7.4 8.4    Liver Function Tests: Recent Labs  Lab 10/24/22 0721  AST 28  ALT 25  ALKPHOS 105  BILITOT 0.8  PROT 6.7  ALBUMIN 3.7   No results for input(s): "LIPASE", "AMYLASE" in the last 168 hours. No results for input(s): "AMMONIA" in the last 168 hours.  ABG    Component Value Date/Time   PHART 7.348 (L) 10/24/2022 1412   PCO2ART 35.2 10/24/2022 1412   PO2ART 103 10/24/2022 1412   HCO3 19.3 (L) 10/24/2022 1412   TCO2 20 (L) 10/24/2022 1412   ACIDBASEDEF 6.0 (H) 10/24/2022 1412   O2SAT 98 10/24/2022 1412     Coagulation Profile: Recent Labs  Lab 10/24/22 1318  INR 1.8*    Cardiac Enzymes: No results for input(s): "CKTOTAL", "CKMB", "CKMBINDEX", "TROPONINI" in the last 168 hours.  HbA1C: Hgb A1c MFr Bld  Date/Time Value Ref Range Status  05/07/2022 04:10 PM 5.5 4.8 - 5.6 % Final    Comment:    (NOTE) Pre diabetes:          5.7%-6.4%  Diabetes:              >  6.4%  Glycemic control for    <7.0% adults with diabetes   12/19/2021 11:07 AM 5.9 4.6 - 6.5 % Final    Comment:    Glycemic Control Guidelines for People with Diabetes:Non Diabetic:  <6%Goal of Therapy: <7%Additional Action Suggested:  >8%     CBG: No results for input(s): "GLUCAP" in the last 168 hours.  Review of Systems:   Unable to obtain, intubated sedated   Past Medical History:  She,  has a past medical history of AICD (automatic cardioverter/defibrillator) present, Anemia, Anxiety, Back pain, CAD (coronary artery disease) (04/11/2016), Chest pain, Chronic systolic CHF (congestive heart failure) ([place]) (03/21/2016), Constipation, COVID, Depression, Dizziness, Dyspnea, Glaucoma, Gout, Heart disease, Heartburn, History of acute anterior wall MI (03/17/2016), History of heart attack, Hyperlipemia, Hypertension, Ischemic cardiomyopathy (10/02/2016), Joint pain, Left bundle branch block, Myocardial infarction ([place]), Nausea, Positive colorectal cancer screening using Cologuard test (03/01/2020), Pre-diabetes, 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   Surgical History:   Past Surgical History:  Procedure Laterality Date   APPENDECTOMY     BIV ICD INSERTION CRT-D N/A 01/02/2022   Procedure: BIV ICD INSERTION CRT-D;  Surgeon: Vickie Epley, MD;  Location: [facility];  Service: Cardiovascular;  Laterality: N/A;   CARDIAC CATHETERIZATION N/A 03/17/2016   Procedure: Left Heart Cath and Coronary Angiography;  Surgeon: Sherren Mocha, MD;  Location: [facility];  Service: Cardiovascular;  Laterality: N/A;   CARDIAC CATHETERIZATION N/A 03/17/2016   Procedure: Coronary Stent Intervention;  Surgeon: Sherren Mocha, MD;  Location: [facility];  Service: Cardiovascular;  Laterality: N/A;   COLONOSCOPY WITH PROPOFOL N/A 08/13/2017   Procedure: COLONOSCOPY WITH PROPOFOL;  Surgeon: Jonathon Bellows, MD;   Location: [facility] ENDOSCOPY;  Service: Gastroenterology;  Laterality: N/A;   COLONOSCOPY WITH PROPOFOL N/A 03/29/2020   Procedure: COLONOSCOPY WITH PROPOFOL;  Surgeon: Lucilla Lame, MD;  Location: [facility] ENDOSCOPY;  Service: Endoscopy;  Laterality: N/A;   MITRAL VALVE REPAIR N/A 10/04/2022   Procedure: MITRAL VALVE REPAIR;  Surgeon: Sherren Mocha, MD;  Location: [place] [facility];  Service: Cardiovascular;  Laterality: N/A;   RIGHT HEART CATH N/A 10/15/2022   Procedure: RIGHT HEART CATH;  Surgeon: Sherren Mocha, MD;  Location: [place] [facility];  Service: Cardiovascular;  Laterality: N/A;   RIGHT/LEFT HEART CATH AND CORONARY ANGIOGRAPHY N/A 08/02/2022   Procedure: RIGHT/LEFT HEART CATH AND CORONARY ANGIOGRAPHY;  Surgeon: Jolaine Artist, MD;  Location: [place] [facility];  Service: Cardiovascular;  Laterality: N/A;   SMALL BOWEL REPAIR     TEE WITHOUT CARDIOVERSION N/A 08/02/2022   Procedure: TRANSESOPHAGEAL ECHOCARDIOGRAM (TEE);  Surgeon: Jolaine Artist, MD;  Location: [facility] ENDOSCOPY;  Service: Cardiovascular;  Laterality: N/A;   TEE WITHOUT CARDIOVERSION N/A 10/04/2022   Procedure: TRANSESOPHAGEAL ECHOCARDIOGRAM (TEE);  Surgeon: Sherren Mocha, MD;  Location: [place] [facility];  Service: Cardiovascular;  Laterality: N/A;   TONSILLECTOMY     UTERINE FIBROID SURGERY       Social History:   reports that she quit smoking about 25 years ago. Her smoking use included cigarettes. She has never used smokeless tobacco. She reports that she does not currently use alcohol. She reports that she does not use drugs.   Family History:  Her family history includes Anxiety disorder in her maternal aunt and mother; Dementia in her mother; Depression in her mother; Diabetes in her mother; Drug abuse in her cousin; High Cholesterol in  her father and mother; Hyperlipidemia in her mother; Hypertension in her father and mother; Mood Disorder in her sister; Paranoid behavior in her mother; Stroke in her  sister; Tuberculosis in her paternal grandfather.   Allergies Allergies  Allergen Reactions   Tetracycline Swelling   Meperidine Nausea And Vomiting    (Demerol)Nausea   Entresto [Sacubitril-Valsartan] Other (See Comments)    Shortness of Breath     Home Medications  Prior to Admission medications   Medication Sig Start Date End Date Taking? Authorizing Provider  atorvastatin (LIPITOR) 80 MG tablet Take 1 tablet by mouth once daily Patient taking differently: Take 80 mg by mouth daily. 11/20/21  Yes Bensimhon, Shaune Pascal, MD  carvedilol (COREG) 6.25 MG tablet Take 1 tablet (6.25 mg total) by mouth 2 (two) times daily with a meal. 10/16/22  Yes Clegg, Amy D, NP  dorzolamide-timolol (COSOPT) 22.3-6.8 MG/ML ophthalmic solution Place 1 drop into both eyes 2 (two) times daily.  01/17/15  Yes [provider]  furosemide (LASIX) 20 MG tablet Take 1 tablet (20 mg total) by mouth daily. 10/10/22  Yes Kathyrn Drown D, NP  isosorbide mononitrate (IMDUR) 30 MG 24 hr tablet Take 1 tablet by mouth once daily 09/05/22  Yes Sherren Mocha, MD  latanoprost (XALATAN) 0.005 % ophthalmic solution Place 1 drop into both eyes at bedtime. 03/15/20  Yes [provider]  lumateperone tosylate (CAPLYTA) 42 MG capsule Take 42 mg by mouth at bedtime.   Yes [provider]  nitroGLYCERIN (NITROSTAT) 0.4 MG SL tablet DISSOLVE ONE TABLET UNDER THE TONGUE EVERY FIVE MINUTES AS NEEDED FOR CHEST PAIN. DO NOT EXCEED A TOTAL OF THREE DOSES IN 15 MINUTES 12/11/21  Yes Sherren Mocha, MD  spironolactone (ALDACTONE) 25 MG tablet Take 1 tablet (25 mg total) by mouth daily. 10/16/22  Yes Clegg, Amy D, NP  traZODone (DESYREL) 150 MG tablet Take 150 mg by mouth at bedtime. 10/23/21  Yes [provider]  valsartan (DIOVAN) 80 MG tablet Take 80 mg by mouth daily.   Yes [provider]  venlafaxine XR (EFFEXOR-XR) 150 MG 24 hr capsule TAKE 2 CAPSULES BY MOUTH ONCE DAILY (NEEDS to follow-up with  psychiatry for further refills) Patient taking differently: Take 300 mg by mouth daily. 11/02/20  Yes Leone Haven, MD  aspirin EC 81 MG tablet Take 1 tablet (81 mg total) by mouth daily. Swallow whole. 10/16/22   Darrick Grinder D, NP     Critical care time: 42 minutes      CRITICAL CARE Performed by: Cristal Generous   Total critical care time: 42 minutes  Critical care time was exclusive of separately billable procedures and treating other patients. Critical care was necessary to treat or prevent imminent or life-threatening deterioration.  Critical care was time spent personally by me on the following activities: development of treatment plan with patient and/or surrogate as well as nursing, discussions with consultants, evaluation of patient's response to treatment, examination of patient, obtaining history from patient or surrogate, ordering and performing treatments and interventions, ordering and review of laboratory studies, ordering and review of radiographic studies, pulse oximetry and re-evaluation of patient's condition.  Eliseo Gum MSN, AGACNP-BC [place] for pager 10/24/2022, 4:14 PM

## 2022-10-24 NOTE — Anesthesia Procedure Notes (Signed)
Central Venous Catheter Insertion Performed by: Oleta Mouse, MD, anesthesiologist Start/End12/20/2023 7:22 AM, 10/24/2022 7:31 AM Patient location: Pre-op. Preanesthetic checklist: patient identified, IV checked, site marked, risks and benefits discussed, surgical consent, monitors and equipment checked, pre-op evaluation, timeout performed and anesthesia consent Hand hygiene performed  and maximum sterile barriers used  PA cath was placed.Swan type:thermodilution Procedure performed without using ultrasound guided technique. Attempts: 1 Patient tolerated the procedure well with no immediate complications.

## 2022-10-24 NOTE — Progress Notes (Addendum)
I notified [employer] answering service that this patient is having surgery today at 0830. They will page Brain Small with this information.  Remo Lipps called back-he states he is aware and has someone scheduled to come this morning.

## 2022-10-25 ENCOUNTER — Inpatient Hospital Stay (HOSPITAL_COMMUNITY): Payer: Medicare Other

## 2022-10-25 ENCOUNTER — Encounter (HOSPITAL_COMMUNITY): Payer: Self-pay | Admitting: Thoracic Surgery (Cardiothoracic Vascular Surgery)

## 2022-10-25 ENCOUNTER — Other Ambulatory Visit: Payer: Self-pay | Admitting: Cardiology

## 2022-10-25 DIAGNOSIS — Z8659 Personal history of other mental and behavioral disorders: Secondary | ICD-10-CM

## 2022-10-25 DIAGNOSIS — I5022 Chronic systolic (congestive) heart failure: Secondary | ICD-10-CM

## 2022-10-25 DIAGNOSIS — Z952 Presence of prosthetic heart valve: Secondary | ICD-10-CM | POA: Diagnosis not present

## 2022-10-25 DIAGNOSIS — I340 Nonrheumatic mitral (valve) insufficiency: Principal | ICD-10-CM

## 2022-10-25 HISTORY — DX: Chronic systolic (congestive) heart failure: I50.22

## 2022-10-25 LAB — PREPARE CRYOPRECIPITATE: Unit division: 0

## 2022-10-25 LAB — BASIC METABOLIC PANEL
Anion gap: 8 (ref 5–15)
Anion gap: 7 (ref 5–15)
BUN: 15 mg/dL (ref 8–23)
BUN: 20 mg/dL (ref 8–23)
CO2: 21 mmol/L — ABNORMAL LOW (ref 22–32)
CO2: 23 mmol/L (ref 22–32)
Calcium: 8.5 mg/dL — ABNORMAL LOW (ref 8.9–10.3)
Calcium: 8.8 mg/dL — ABNORMAL LOW (ref 8.9–10.3)
Chloride: 109 mmol/L (ref 98–111)
Chloride: 109 mmol/L (ref 98–111)
Creatinine, Ser: 0.87 mg/dL (ref 0.44–1.00)
Creatinine, Ser: 1.11 mg/dL — ABNORMAL HIGH (ref 0.44–1.00)
GFR, Estimated: >60 mL/min (ref >60)
GFR, Estimated: 53 mL/min — ABNORMAL LOW (ref >60)
Glucose, Bld: 143 mg/dL — ABNORMAL HIGH (ref 70–99)
Glucose, Bld: 154 mg/dL — ABNORMAL HIGH (ref 70–99)
Potassium: 4 mmol/L (ref 3.5–5.1)
Potassium: 4 mmol/L (ref 3.5–5.1)
Sodium: 138 mmol/L (ref 135–145)
Sodium: 139 mmol/L (ref 135–145)

## 2022-10-25 LAB — GLUCOSE, CAPILLARY
Glucose-Capillary: 117 mg/dL — ABNORMAL HIGH (ref 70–99)
Glucose-Capillary: 132 mg/dL — ABNORMAL HIGH (ref 70–99)
Glucose-Capillary: 140 mg/dL — ABNORMAL HIGH (ref 70–99)
Glucose-Capillary: 143 mg/dL — ABNORMAL HIGH (ref 70–99)
Glucose-Capillary: 145 mg/dL — ABNORMAL HIGH (ref 70–99)
Glucose-Capillary: 145 mg/dL — ABNORMAL HIGH (ref 70–99)
Glucose-Capillary: 145 mg/dL — ABNORMAL HIGH (ref 70–99)
Glucose-Capillary: 146 mg/dL — ABNORMAL HIGH (ref 70–99)
Glucose-Capillary: 147 mg/dL — ABNORMAL HIGH (ref 70–99)
Glucose-Capillary: 157 mg/dL — ABNORMAL HIGH (ref 70–99)
Glucose-Capillary: 171 mg/dL — ABNORMAL HIGH (ref 70–99)
Glucose-Capillary: 172 mg/dL — ABNORMAL HIGH (ref 70–99)
Glucose-Capillary: 174 mg/dL — ABNORMAL HIGH (ref 70–99)

## 2022-10-25 LAB — MAGNESIUM
Magnesium: 2.6 mg/dL — ABNORMAL HIGH (ref 1.7–2.4)
Magnesium: 2.3 mg/dL (ref 1.7–2.4)

## 2022-10-25 LAB — CBC
HCT: 25.6 % — ABNORMAL LOW (ref 36.0–46.0)
HCT: 29.1 % — ABNORMAL LOW (ref 36.0–46.0)
Hemoglobin: 8.7 g/dL — ABNORMAL LOW (ref 12.0–15.0)
Hemoglobin: 9.7 g/dL — ABNORMAL LOW (ref 12.0–15.0)
MCH: 28.8 pg (ref 26.0–34.0)
MCH: 28.5 pg (ref 26.0–34.0)
MCHC: 34 g/dL (ref 30.0–36.0)
MCHC: 33.3 g/dL (ref 30.0–36.0)
MCV: 84.8 fL (ref 80.0–100.0)
MCV: 85.6 fL (ref 80.0–100.0)
Platelets: 125 10*3/uL — ABNORMAL LOW (ref 150–400)
Platelets: 115 10*3/uL — ABNORMAL LOW (ref 150–400)
RBC: 3.02 MIL/uL — ABNORMAL LOW (ref 3.87–5.11)
RBC: 3.4 MIL/uL — ABNORMAL LOW (ref 3.87–5.11)
RDW: 16 % — ABNORMAL HIGH (ref 11.5–15.5)
RDW: 16.2 % — ABNORMAL HIGH (ref 11.5–15.5)
WBC: 7.6 10*3/uL (ref 4.0–10.5)
WBC: 8.6 10*3/uL (ref 4.0–10.5)
nRBC: 0 % (ref 0.0–0.2)
nRBC: 0 % (ref 0.0–0.2)

## 2022-10-25 LAB — GLOBAL TEG PANEL
CFF Max Amplitude: 15.9 mm (ref 15–32)
CK with Heparinase (R): 5.4 min (ref 4.3–8.3)
Citrated Functional Fibrinogen: 290.1 mg/dL (ref 278–581)
Citrated Kaolin (K): 1.6 min (ref 0.8–2.1)
Citrated Kaolin (MA): 55 mm (ref 52–69)
Citrated Kaolin (R): 5.6 min (ref 4.6–9.1)
Citrated Kaolin Angle: 70.9 deg (ref 63–78)
Citrated Rapid TEG (MA): 52.7 mm (ref 52–70)

## 2022-10-25 LAB — BPAM PLATELET PHERESIS
Blood Product Expiration Date: 202312222359
ISSUE DATE / TIME: 202312201437
Unit Type and Rh: 6200

## 2022-10-25 LAB — BPAM CRYOPRECIPITATE
Blood Product Expiration Date: 202312202031
ISSUE DATE / TIME: 202312201445
Unit Type and Rh: 5100

## 2022-10-25 LAB — COOXEMETRY PANEL
Carboxyhemoglobin: 2.8 % — ABNORMAL HIGH (ref 0.5–1.5)
Methemoglobin: <0.7 % (ref 0.0–1.5)
O2 Saturation: 70.1 %
Total hemoglobin: 8.9 g/dL — ABNORMAL LOW (ref 12.0–16.0)

## 2022-10-25 LAB — PREPARE PLATELET PHERESIS: Unit division: 0

## 2022-10-25 LAB — SURGICAL PATHOLOGY

## 2022-10-25 MED ORDER — FUROSEMIDE 10 MG/ML IJ SOLN
40.0000 mg | Freq: Two times a day (BID) | INTRAMUSCULAR | Status: DC
  Administered 2022-10-25: 40 mg via INTRAVENOUS
  Filled 2022-10-25: qty 4

## 2022-10-25 MED ORDER — TRAZODONE HCL 150 MG PO TABS
150.0000 mg | ORAL_TABLET | Freq: Every day | ORAL | Status: DC
  Administered 2022-10-25 – 2022-10-29 (×5): 150 mg via ORAL
  Filled 2022-10-25 (×2): qty 1
  Filled 2022-10-25: qty 3
  Filled 2022-10-25 (×2): qty 1

## 2022-10-25 MED ORDER — ENOXAPARIN SODIUM 40 MG/0.4ML IJ SOSY: 40.0000 mg | PREFILLED_SYRINGE | Freq: Every day | INTRAMUSCULAR | Status: DC

## 2022-10-25 MED ORDER — WARFARIN - PHYSICIAN DOSING INPATIENT: Freq: Every day | Status: DC

## 2022-10-25 MED ORDER — ORAL CARE MOUTH RINSE: 15.0000 mL | OROMUCOSAL | Status: DC | PRN

## 2022-10-25 MED ORDER — AMIODARONE HCL 200 MG PO TABS
400.0000 mg | ORAL_TABLET | Freq: Two times a day (BID) | ORAL | Status: DC
  Administered 2022-10-25: 400 mg via ORAL
  Filled 2022-10-25 (×2): qty 2

## 2022-10-25 MED ORDER — INSULIN ASPART 100 UNIT/ML IJ SOLN
0.0000 [IU] | INTRAMUSCULAR | Status: DC
  Administered 2022-10-25: 2 [IU] via SUBCUTANEOUS
  Administered 2022-10-25: 4 [IU] via SUBCUTANEOUS
  Administered 2022-10-25: 2 [IU] via SUBCUTANEOUS

## 2022-10-25 MED ORDER — METOPROLOL TARTRATE 25 MG PO TABS: 25.0000 mg | ORAL_TABLET | Freq: Two times a day (BID) | ORAL | Status: DC

## 2022-10-25 MED ORDER — WARFARIN SODIUM 2 MG PO TABS
2.0000 mg | ORAL_TABLET | Freq: Once | ORAL | Status: AC
  Administered 2022-10-25: 2 mg via ORAL
  Filled 2022-10-25: qty 1

## 2022-10-25 MED ORDER — INSULIN ASPART 100 UNIT/ML IJ SOLN: 0.0000 [IU] | INTRAMUSCULAR | Status: DC

## 2022-10-25 MED ORDER — BUMETANIDE 0.25 MG/ML IJ SOLN
2.0000 mg | Freq: Once | INTRAMUSCULAR | Status: AC
  Administered 2022-10-25: 2 mg via INTRAVENOUS
  Filled 2022-10-25: qty 8

## 2022-10-25 MED ORDER — ENOXAPARIN SODIUM 40 MG/0.4ML IJ SOSY
40.0000 mg | PREFILLED_SYRINGE | Freq: Every day | INTRAMUSCULAR | Status: DC
  Administered 2022-10-25: 40 mg via SUBCUTANEOUS
  Filled 2022-10-25: qty 0.4

## 2022-10-25 MED ORDER — CARVEDILOL 6.25 MG PO TABS
6.2500 mg | ORAL_TABLET | Freq: Two times a day (BID) | ORAL | Status: DC
  Administered 2022-10-25 (×2): 6.25 mg via ORAL
  Filled 2022-10-25 (×2): qty 1

## 2022-10-25 MED ORDER — POTASSIUM CHLORIDE CRYS ER 20 MEQ PO TBCR
20.0000 meq | EXTENDED_RELEASE_TABLET | Freq: Two times a day (BID) | ORAL | Status: DC
  Administered 2022-10-25 – 2022-10-27 (×5): 20 meq via ORAL
  Filled 2022-10-25 (×5): qty 1

## 2022-10-25 MED ORDER — VENLAFAXINE HCL ER 75 MG PO CP24
300.0000 mg | ORAL_CAPSULE | Freq: Every day | ORAL | Status: DC
  Administered 2022-10-25 – 2022-10-30 (×6): 300 mg via ORAL
  Filled 2022-10-25 (×2): qty 4
  Filled 2022-10-25: qty 2
  Filled 2022-10-25 (×2): qty 4
  Filled 2022-10-25: qty 2

## 2022-10-25 MED ORDER — ASPIRIN 81 MG PO TBEC
81.0000 mg | DELAYED_RELEASE_TABLET | Freq: Every day | ORAL | Status: DC
  Administered 2022-10-25 – 2022-10-30 (×6): 81 mg via ORAL
  Filled 2022-10-25 (×6): qty 1

## 2022-10-25 NOTE — Consult Note (Incomplete)
Advanced Heart Failure Rounding Note   Subjective:        Objective:   Weight Range:  Vital Signs:   Temp:  [93.9 F (34.4 C)-103.3 F (39.6 C)] 100.2 F (37.9 C) (12/21 0700) Pulse Rate:  [67-101] 96 (12/21 0700) Resp:  [8-28] 23 (12/21 0700) BP: (117-144)/(57-69) 128/61 (12/21 0700) SpO2:  [92 %-100 %] 94 % (12/21 0700) Arterial Line BP: (101-198)/(43-72) 174/49 (12/21 0700) FiO2 (%):  [40 %-50 %] 40 % (12/20 2010) Weight:  [63.9 kg] 63.9 kg (12/21 0500) Last BM Date :  (pta)  Weight change: Filed Weights   10/24/22 0731 10/25/22 0500  Weight: 59 kg 63.9 kg    Intake/Output:   Intake/Output Summary (Last 24 hours) at 10/25/2022 0804 Last data filed at 10/25/2022 0700 Gross per 24 hour  Intake 7217.6 ml  Output 4145 ml  Net 3072.6 ml     Physical Exam: General:  Well appearing. No resp difficulty HEENT: normal Neck: supple. JVP . Carotids 2+ bilat; no bruits. No lymphadenopathy or thryomegaly appreciated. Cor: PMI nondisplaced. Regular rate & rhythm. No rubs, gallops or murmurs. Lungs: clear Abdomen: soft, nontender, nondistended. No hepatosplenomegaly. No bruits or masses. Good bowel sounds. Extremities: no cyanosis, clubbing, rash, edema Neuro: alert & orientedx3, cranial nerves grossly intact. moves all 4 extremities w/o difficulty. Affect pleasant  Telemetry: ***  Labs: Basic Metabolic Panel: Recent Labs  Lab 10/24/22 0721 10/24/22 0918 10/24/22 1008 10/24/22 1022 10/24/22 1103 10/24/22 1201 10/24/22 1204 10/24/22 1412 10/24/22 1526 10/24/22 2053 10/24/22 2055 10/25/22 0448  NA 143   < > 143   < > 159* 141   < > 145 145 144 142 138  K 3.9   < > 3.3*   < > 4.4 4.6   < > 3.8 3.7 4.9 4.7 4.0  CL 114*   < > 110  --  99 106  --   --   --   --  116* 109  CO2 23  --   --   --   --   --   --   --   --   --  21* 21*  GLUCOSE 118*   < > 138*  --  170* 174*  --   --   --   --  132* 143*  BUN 26*   < > 20  --  20 19  --   --   --   --  17 15   CREATININE 1.29*   < > 0.70  --  0.80 0.70  --   --   --   --  0.83 0.87  CALCIUM 9.8  --   --   --   --   --   --   --   --   --  7.7* 8.5*  MG  --   --   --   --   --   --   --   --   --   --  3.7* 2.6*   < > = values in this interval not displayed.    Liver Function Tests: Recent Labs  Lab 10/24/22 0721  AST 28  ALT 25  ALKPHOS 105  BILITOT 0.8  PROT 6.7  ALBUMIN 3.7   No results for input(s): "LIPASE", "AMYLASE" in the last 168 hours. No results for input(s): "AMMONIA" in the last 168 hours.  CBC: Recent Labs  Lab 10/24/22 0721 10/24/22 0918 10/24/22 1131 10/24/22 1201 10/24/22 1318  10/24/22 1412 10/24/22 1525 10/24/22 1526 10/24/22 2053 10/24/22 2055 10/25/22 0448  WBC 7.4  --   --   --  8.4  --  6.4  --   --  5.7 7.6  HGB 11.0*   < > 8.5*   < > 9.7*   < > 9.1* 8.5* 8.8* 9.7* 8.7*  HCT 34.1*   < > 25.4*   < > 28.7*   < > 27.2* 25.0* 26.0* 28.1* 25.6*  MCV 88.1  --   --   --  82.9  --  85.5  --   --  83.6 84.8  PLT 241  --  143*  --  131*  --  122*  --   --  104* 125*   < > = values in this interval not displayed.    Cardiac Enzymes: No results for input(s): "CKTOTAL", "CKMB", "CKMBINDEX", "TROPONINI" in the last 168 hours.  BNP: BNP (last 3 results) Recent Labs    05/07/22 1853 07/17/22 1458 10/14/22 1130  BNP 2,597.3* 2,184.7* 1,220.2*    ProBNP (last 3 results) Recent Labs    12/19/21 1107 10/01/22 1220 10/10/22 0923  PROBNP 2,342.0* 13,775* 9,776*      Other results:  Imaging: DG Chest Port 1 View  Result Date: 10/24/2022 CLINICAL DATA:  Status post mitral valve replacement EXAM: PORTABLE CHEST 1 VIEW COMPARISON:  Chest x-ray dated October 14, 2022 FINDINGS: ETT tip is proximally 2 cm from the carina. Gastric decompression tube partially seen coursing below the diaphragm. Mediastinal drain. Right IJ approach PA catheter with tip overlying the expected area of the right pulmonary artery. Left AICD with unchanged lead position. Cardiac  and mediastinal contours are within normal limits status post median sternotomy. Lungs are clear. No pleural effusion or pneumothorax. IMPRESSION: 1. Support apparatus as above. 2. No acute cardiopulmonary process. Electronically Signed   By: Yetta Glassman M.D.   On: 10/24/2022 16:04     Medications:     Scheduled Medications:  acetaminophen  1,000 mg Oral Q6H   Or   acetaminophen (TYLENOL) oral liquid 160 mg/5 mL  1,000 mg Per Tube Q6H   aspirin EC  81 mg Oral Daily   atorvastatin  80 mg Oral Daily   bisacodyl  10 mg Oral Daily   Or   bisacodyl  10 mg Rectal Daily   carvedilol  6.25 mg Oral BID WC   Chlorhexidine Gluconate Cloth  6 each Topical Daily   docusate sodium  200 mg Oral Daily   dorzolamide-timolol  1 drop Both Eyes BID   enoxaparin (LOVENOX) injection  40 mg Subcutaneous QHS   furosemide  40 mg Intravenous Q12H   insulin aspart  0-24 Units Subcutaneous Q4H   latanoprost  1 drop Both Eyes QHS   metoCLOPramide (REGLAN) injection  10 mg Intravenous Q6H   [START ON 10/26/2022] pantoprazole  40 mg Oral Daily   potassium chloride  20 mEq Oral BID   sodium chloride flush  10-40 mL Intracatheter Q12H   sodium chloride flush  3 mL Intravenous Q12H   venlafaxine XR  300 mg Oral Q breakfast   warfarin  2 mg Oral ONCE-1600   Warfarin - Physician Dosing Inpatient   Does not apply q1600    Infusions:  sodium chloride 20 mL/hr at 10/25/22 0700   sodium chloride     sodium chloride 10 mL/hr at 10/25/22 0700   albumin human 60 mL/hr at 10/25/22 0700    ceFAZolin (ANCEF) IV Stopped (  10/25/22 0532)   dexmedetomidine (PRECEDEX) IV infusion Stopped (10/24/22 1933)   insulin 1.4 Units/hr (10/25/22 0700)   lactated ringers     lactated ringers     lactated ringers 20 mL/hr at 10/25/22 0700   nitroGLYCERIN 100 mcg/min (10/25/22 0700)   norepinephrine (LEVOPHED) Adult infusion Stopped (10/24/22 1625)   phenylephrine (NEO-SYNEPHRINE) Adult infusion     vasopressin      PRN  Medications: sodium chloride, albumin human, dextrose, lactated ringers, metoprolol tartrate, midazolam, morphine injection, mouth rinse, oxyCODONE, prochlorperazine, sodium chloride flush, sodium chloride flush, traMADol   Assessment/Plan :   1. Mitral regurgitation, severe - s/p mTEER with 2 clips on 10/04/22 - Post op echo EF 25% with moderate to severe MR felt secondary to SLDA of the second MitraClip placement. Mean gradient at 71mHg with an average HR at 83bpm. RV normal - S/p MVR with 29 mm Mosaic valve by Dr. WLavonna Monarch Returned to OR this afternoon for control of bleeding. CT output has slowed. Follow CBC. 2. Chronic systolic HF - initially felt due to ischemic CM. S/p Anterior STEMI 5/17. Now suspect MR major contributing factor. - Echo (8/19): EF 30-35%. - cMRI 1/23 EF 15% No LGE. RV ok. Severe LV dyssynchrony Moderate MR  - CRT-D placed 01/02/22 with LBBB lead by Dr. LQuentin Ore- Echo 07/17/22 EF 20-25% G2DD, RV ok. Severe central MR  - s/p mTEER with 2 clips on 10/04/22 - Echo 10/05/22: EF 25% with moderate to severe MR felt secondary to SLDA of the second MitraClip placement.  - RHC 12/23: RA 4, PA 47/20 (33) PCWP 13 CO 5.2 CI 3.1  PVR 2.1  - S/p MVR 10/24/22 - Currently on 5NE + 0.375 milrinone, CO-OX 79% and CI 2.5.  - CVP 5. No diuresis today. - GDMT once off pressor support - Hold off on beta blocker for now. 3. Coronary artery disease involving native coronary artery of native heart without angina pectoris - Hx of anterior STEMI in 5/17 tx with DES x 2 to the LAD.   - Myoview (7/21)  EF 20% no ischemia.   - No LGE on cMRI (1/23) - LHC 9/23 patent LAD stents - On aspirin + statin 4. LBBB - s/p CRT-D 3/23 with Dr. LQuentin Ore - EF has not improved  5. Post-op ABLA - Returned to OR this afternoon for control of bleeding as above - Monitor CBC     Length of Stay: 1Guffey12/21/2023, 8:04 AM  Advanced Heart Failure Team Pager [phone](M-F; 7a - 4p)   Please contact [facility] for night-coverage after hours (4p -7a ) and weekends on [email or web address]

## 2022-10-25 NOTE — Progress Notes (Addendum)
[address]       [place] [address]             [phone]                             Day of Surgery SP Mitral valve replacement   Total Length of Stay:  LOS: 1days    SUBJECTIVE: Feels well. Feels swollen in hands    Vitals:    10/25/22 0545  BP: (!) 146/69  Pulse: 85  Resp: 18  Temp: 98.2 F (36.8 C)  SpO2: 99%      Intake/Output    None          ceFAZolin (ANCEF) IV      ceFAZolin (ANCEF) IV     dexmedetomidine     heparin 30,000 units/NS 1000 mL solution for CELLSAVER     milrinone     norepinephrine     tranexamic acid (CYKLOKAPRON) 2,500 mg in sodium chloride 0.9 % 250 mL (10 mg/mL) infusion     vancomycin        CBC Labs (Brief)          Component Value Date/Time    WBC 7.0 10/23/2022 1319    RBC 3.43 (L) 10/23/2022 1319    HGB 12.0 10/23/2022 1319    HGB 12.3 08/29/2022 1000    HCT 35.9 (L) 10/23/2022 1319    HCT 35.5 08/29/2022 1000    PLT 100 (L) 10/23/2022 1319    PLT 123 (L) 08/29/2022 1000    MCV 104.7 (H) 10/23/2022 1319    MCV 101 (H) 08/29/2022 1000    MCH 35.0 (H) 10/23/2022 1319    MCHC 33.4 10/23/2022 1319    RDW 13.8 10/23/2022 1319    RDW 13.3 08/29/2022 1000    LYMPHSABS 1.0 01/23/2021 1027    MONOABS 0.4 01/23/2021 1027    EOSABS 0.0 01/23/2021 1027    BASOSABS 0.0 01/23/2021 1027      CMP     Labs (Brief)          Component Value Date/Time    NA 140 10/23/2022 1319    NA 143 10/01/2022 0948    K 3.7 10/23/2022 1319    CL 110 10/23/2022 1319    CO2 21 (L) 10/23/2022 1319    GLUCOSE 104 (H) 10/23/2022 1319    BUN 21 10/23/2022 1319    BUN 14 10/01/2022 0948    CREATININE 0.84 10/23/2022 1319    CALCIUM 9.1 10/23/2022 1319    PROT 6.2 (L) 10/23/2022 1319    PROT 6.5 08/29/2022 1000    ALBUMIN 3.9 10/23/2022 1319    ALBUMIN 4.2 08/29/2022 1000    AST 18 10/23/2022 1319    ALT 20 10/23/2022 1319    ALKPHOS 63 10/23/2022 1319    BILITOT 0.9 10/23/2022 1319    BILITOT 0.5 08/29/2022  1000    GFRNONAA >60 10/23/2022 1319    GFRAA >90 08/12/2011 2348      ABG Labs (Brief)          Component Value Date/Time    TCO2 24 07/09/2015 1745      CBG (last 3)  Recent Labs (last 2 labs)  No results for input(s): "GLUCAP" in the last 72 hours.   EXAM Lungs: overall clear Card: RR no murmur Ext: edematous. Warm Neuro: alert     ASSESSMENT: POD #1 sp Mvreplacement with  reexploration for bleeding post op Hemodynamics good. Will dc art line and swan and wean NTG for SBP over 177mHg Diuresis today Remove Pws and review CT output and hopefully remove later today OOB to chair Start coumadin      PCoralie Common MD 10/25/2022

## 2022-10-25 NOTE — Discharge Summary (Signed)
[address]       [place] [address]             [phone]    Physician Discharge Summary  Patient ID: Melanie Melanie Ray MRN: [id number] DOB/AGE: [birth year] 71 y.o.  Admit date: 10/24/2022 Discharge date: 10/30/2022  Admission Diagnoses:  Patient Active Problem List   Diagnosis Date Noted   History of depression 10/25/2022   Chronic HFrEF (heart failure with reduced ejection fraction) (HCC) 10/25/2022   Endotracheal tube present 10/24/2022   ABLA (acute blood loss anemia) 10/24/2022   HFrEF (heart failure with reduced ejection fraction) (HCC) 10/24/2022   Postprocedural hypotension 10/24/2022   Severe mitral regurgitation 10/14/2022   Acute on chronic systolic CHF (congestive heart failure) (HCC) 10/14/2022   Acute on chronic combined systolic and diastolic CHF (congestive heart failure) (HCC) 10/05/2022   S/P mitral valve clip implantation 10/04/2022   Severe mitral insufficiency 10/04/2022   Allergic rhinitis 09/25/2022   COVID-19 06/27/2022   ICD (implantable cardioverter-defibrillator) in place 01/02/2022   Finger fracture 12/19/2021   Anemia, unspecified 11/02/2021   Memory difficulty 06/28/2020   Cerebellar stroke, acute (HCC) 03/30/2020   Nightmares 03/01/2020   Left bundle branch block 12/03/2018   QT prolongation 12/03/2018   Obsessive compulsive disorder 08/27/2018   Rash 02/07/2018   Hot flash, menopausal 02/07/2018   Vitamin D deficiency 06/17/2017   Class 1 obesity without serious comorbidity with body mass index (BMI) of 30.0 to 30.9 in adult 06/17/2017   Dizziness 01/03/2017   Ischemic cardiomyopathy 10/02/2016   CAD (coronary artery disease) 04/11/2016   Chronic systolic CHF (congestive heart failure) (HCC) 03/21/2016   History of acute anterior wall MI 03/18/2016   DM type 2 (diabetes mellitus, type 2) (HCC) 08/03/2015   Essential hypertension 07/28/2015   HLD (hyperlipidemia) 07/28/2015   Glaucoma 07/28/2015    Depression 07/28/2015   Anxiety 07/28/2015   Insomnia 07/28/2015   Overweight (BMI 25.0-29.9) 07/28/2015   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   Uterine leiomyoma 04/27/2008   Osteopenia 04/27/2008   Other specified disorder of bladder 04/27/2008     Discharge Diagnoses:  Patient Active Problem List   Diagnosis Date Noted   Hyperglycemia 10/26/2022   History of depression 10/25/2022   Chronic HFrEF (heart failure with reduced ejection fraction) (HCC) 10/25/2022   S/P mitral valve replacement 10/24/2022   Endotracheal tube present 10/24/2022   ABLA (acute blood loss anemia) 10/24/2022   HFrEF (heart failure with reduced ejection fraction) (HCC) 10/24/2022   Postprocedural hypotension 10/24/2022   Severe mitral regurgitation 10/14/2022   Acute on chronic systolic CHF (congestive heart failure) (HCC) 10/14/2022   Acute on chronic combined systolic and diastolic CHF (congestive heart failure) (HCC) 10/05/2022   S/P mitral valve clip implantation 10/04/2022   Severe mitral insufficiency 10/04/2022   Allergic rhinitis 09/25/2022   COVID-19 06/27/2022   ICD (implantable cardioverter-defibrillator) in place 01/02/2022   Finger fracture 12/19/2021   Anemia, unspecified 11/02/2021   Memory difficulty 06/28/2020   Cerebellar stroke, acute (HCC) 03/30/2020   Nightmares 03/01/2020   Left bundle branch block 12/03/2018   QT prolongation 12/03/2018   Obsessive compulsive disorder 08/27/2018   Rash 02/07/2018   Hot flash, menopausal 02/07/2018   Vitamin D deficiency 06/17/2017   Class 1 obesity without serious comorbidity with body mass index (BMI) of 30.0 to 30.9 in adult 06/17/2017   Dizziness 01/03/2017   Ischemic cardiomyopathy 10/02/2016   CAD (coronary artery disease) 04/11/2016   Chronic  systolic CHF (congestive heart failure) (HCC) 03/21/2016   History of acute anterior wall MI 03/18/2016   DM type 2 (diabetes mellitus, type 2) (HCC) 08/03/2015   Essential hypertension  07/28/2015   HLD (hyperlipidemia) 07/28/2015   Glaucoma 07/28/2015   Depression 07/28/2015   Anxiety 07/28/2015   Insomnia 07/28/2015   Overweight (BMI 25.0-29.9) 07/28/2015   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   Uterine leiomyoma 04/27/2008   Osteopenia 04/27/2008   Other specified disorder of bladder 04/27/2008     Discharged Condition: stable  History of Present Illness:     Melanie Melanie Ray is a 71 yo female with ischemic cardiomyopathy following MI treated aggressively medically and with CRRT/defibrillator who was recently treated for severe mitral regurgitation with mitral clip 3 weeks ago. Procedurally excellent result however following day had loud murmur and recurrent mitral regurgitation. Patient now with CHF symptoms worse than prior to mitral clip with severe dyspnea on exertion with minimal exertion. No chest pain. Work up with echo now with EF 20-25% and severe mitral regurgitation with mean gradient of . There is very good Right heart function. She has no evidence of SLD of clip but etiology may be secondary to injured chord/rupture. She had RHC yesterday which has surprisingly adequate hemodynamics considering her LV function.   Dr. Leafy Ro reviewed the patient's studies and determined surgical intervention would provide this patient the best long term treatment. Dr. Leafy Ro reviewed treatment options as well as the risks and benefits of surgery. Melanie Melanie Ray was agreeable to proceed with surgery.  Melanie Ray Course: Melanie Melanie Ray arrived at [facility] and was brought to the operating room on 10/24/22. She underwent mitral valve replacement utilizing a 29mm Mosaic valve. She tolerated the procedure well and was transferred to the SICU in stable condition. When transferred to the SICU she was noted to have more post op drainage from the chest tubes than expected. She was brought back to the operating room on 10/24/22 and underwent mediastinal reexploration.  A pericardial bleeder was identified and controlled with electrocautery. She tolerated the procedure well and was again transferred to the SICU in stable condition. She was extubated the night of surgery without complication. Drips were weaned as hemodynamics tolerated. Her arterial line, swan ganz catheter and epicardial pacing wires were removed on POD1 without complication. She was volume overload and diuresed appropriately. Coumadin 2mg  daily was started for her mitral valve. She was followed by cardiology for her chronic systolic heart failure. She was started on Lopressor but this was discontinued and she was restarted on home Coreg 12.5mg  BID and Irbesartan 37.5mg  QD. Her chest tubes were removed without complication. Her INR was 1.8, 2.1 and 1.5 so Coumadin was increased to 2.5mg  daily.  She was felt stable for transfer to the progressive unit. Cardiology started her on [name], Spironolactone and Lasix for heart failure. Patient was given several laxatives and had loose stools. Imodium was given, stool softeners were stopped, and with time, loose stools stopped. INR was slow to increase. It was 1.5 on 12/24. Coumadin was increased to 4 mg daily. INR 12/25 was up to 1.8. Coumadin was decreased to 3mg  daily. INR on 12/26 was 1.9, Coumadin 3mg  daily was continued at discharge. Her H/H decreased from 9.3/28.6 to 8.5/25, iron supplement was started and H/H on follow up CBC had increased to 9.1/27.5. Home health was recommended by PT which was arranged accordingly. Her ambulation was progressing well. Sternal incision was healing well without signs of infection. She was felt surgically stable  for discharge.   Consults:  Cardiology, heart failure, critical care  Significant Diagnostic Studies:   ECHOCARDIOGRAM REPORT       Patient Name:   Melanie Melanie Ray Date of Exam: 10/05/2022  Medical Rec #:  [id number]             Height:       63.0 in  Accession #:    [id number]            Weight:        130.0 lb  Date of Birth:  [birth year]             BSA:          1.610 m  Patient Age:    71 years              BP:           126/59 mmHg  Patient Gender: F                     HR:           74 bpm.  Exam Location:  Inpatient   Procedure: 2D Echo, Cardiac Doppler and Color Doppler   Indications:    S/P mitral clip implantation    History:        Patient has prior history of Echocardiogram examinations,  most                 recent 10/04/2022. CAD, Defibrillator, Mitral Valve  Disease,                 Arrythmias:LBBB; Risk Factors:Hypertension, Diabetes,                  Dyslipidemia and Former Smoker. Ischemic cardiomyopathy.  S/p                 MitraClip Procedure, 1 NT and 1 NTW Implanted. Procedure  date                 10/04/22.    Sonographer:    Ross Ludwig RDCS (AE)  Referring Phys: [phone] JILL D MCDANIEL   IMPRESSIONS     1. Left ventricular ejection fraction, by estimation, is 25%. The left  ventricle has severely decreased function. The left ventricle demonstrates  global hypokinesis. The left ventricular internal cavity size was  moderately dilated. Left ventricular  diastolic parameters are consistent with Grade III diastolic dysfunction  (restrictive).   2. Right ventricular systolic function is normal. The right ventricular  size is normal. There is normal pulmonary artery systolic pressure.   3. Left atrial size was severely dilated.   4. Right atrial size was mildly dilated.   5. A small pericardial effusion is present.   6. The mitral valve has been repaired/replaced. Moderate to severe mitral  valve regurgitation. This likely represted SLDA of the second Mitra Clip  from 10/04/22. The mean mitral valve gradient is 9.0 mmHg with average  heart rate of 83 bpm.   7. Tricuspid valve regurgitation is mild to moderate.   8. The aortic valve was not well visualized. Aortic valve regurgitation  is mild. Aortic valve sclerosis is present, with no evidence of aortic   valve stenosis.   9. The inferior vena cava is normal in size with greater than 50%  respiratory variability, suggesting right atrial pressure of 3 mmHg.  10. Evidence of atrial level shunting detected by color flow Doppler.   Conclusion(s)/Recommendation(s): Discussed with team.  FINDINGS   Left Ventricle: Left ventricular ejection fraction, by estimation, is  25%. The left ventricle has severely decreased function. The left  ventricle demonstrates global hypokinesis. The left ventricular internal  cavity size was moderately dilated. There is   no left ventricular hypertrophy. Left ventricular diastolic parameters  are consistent with Grade III diastolic dysfunction (restrictive).   Right Ventricle: The right ventricular size is normal. No increase in  right ventricular wall thickness. Right ventricular systolic function is  normal. There is normal pulmonary artery systolic pressure. The tricuspid  regurgitant velocity is 2.45 m/s, and   with an assumed right atrial pressure of 3 mmHg, the estimated right  ventricular systolic pressure is 27.0 mmHg.   Left Atrium: Left atrial size was severely dilated.   Right Atrium: Right atrial size was mildly dilated.   Pericardium: A small pericardial effusion is present.   Mitral Valve: The mitral valve has been repaired/replaced. Moderate to  severe mitral valve regurgitation. MV peak gradient, 17.6 mmHg. The mean  mitral valve gradient is 9.0 mmHg with average heart rate of 83 bpm.   Tricuspid Valve: The tricuspid valve is not well visualized. Tricuspid  valve regurgitation is mild to moderate. No evidence of tricuspid  stenosis.   Aortic Valve: The aortic valve was not well visualized. Aortic valve  regurgitation is mild. Aortic regurgitation PHT measures 342 msec. Aortic  valve sclerosis is present, with no evidence of aortic valve stenosis.  Aortic valve mean gradient measures 7.0  mmHg. Aortic valve peak gradient measures 12.8  mmHg. Aortic valve area, by  VTI measures 1.49 cm.   Pulmonic Valve: The pulmonic valve was normal in structure. Pulmonic valve  regurgitation is not visualized. No evidence of pulmonic stenosis.   Aorta: The aortic root and ascending aorta are structurally normal, with  no evidence of dilitation.   Venous: The inferior vena cava is normal in size with greater than 50%  respiratory variability, suggesting right atrial pressure of 3 mmHg.   IAS/Shunts: Evidence of atrial level shunting detected by color flow  Doppler.     LEFT VENTRICLE  PLAX 2D  LVIDd:         6.10 cm      Diastology  LVIDs:         5.30 cm      LV e' medial:    5.44 cm/s  LV PW:         1.10 cm      LV E/e' medial:  39.5  LV IVS:        1.10 cm      LV e' lateral:   4.35 cm/s  LVOT diam:     2.00 cm      LV E/e' lateral: 49.4  LV SV:         55  LV SV Index:   34  LVOT Area:     3.14 cm    LV Volumes (MOD)  LV vol d, MOD A2C: 217.0 ml  LV vol d, MOD A4C: 199.0 ml  LV vol s, MOD A2C: 152.0 ml  LV vol s, MOD A4C: 157.0 ml  LV SV MOD A2C:     65.0 ml  LV SV MOD A4C:     199.0 ml  LV SV MOD BP:      53.7 ml   RIGHT VENTRICLE             IVC  RV Basal diam:  3.50 cm  IVC diam: 1.40 cm  RV S prime:     10.00 cm/s  TAPSE (M-mode): 1.9 cm   LEFT ATRIUM             Index        RIGHT ATRIUM           Index  LA diam:        4.20 cm 2.61 cm/m   RA Area:     17.20 cm  LA Vol (A2C):   89.9 ml 55.83 ml/m  RA Volume:   49.10 ml  30.49 ml/m  LA Vol (A4C):   90.6 ml 56.27 ml/m  LA Biplane Vol: 96.5 ml 59.93 ml/m   AORTIC VALVE  AV Area (Vmax):    1.69 cm  AV Area (Vmean):   1.59 cm  AV Area (VTI):     1.49 cm  AV Vmax:           179.00 cm/s  AV Vmean:          125.000 cm/s  AV VTI:            0.370 m  AV Peak Grad:      12.8 mmHg  AV Mean Grad:      7.0 mmHg  LVOT Vmax:         96.40 cm/s  LVOT Vmean:        63.400 cm/s  LVOT VTI:          0.176 m  LVOT/AV VTI ratio: 0.48  AI PHT:             342 msec    AORTA  Ao Root diam: 2.70 cm   MITRAL VALVE                TRICUSPID VALVE  MV Area (PHT): 2.20 cm     TR Peak grad:   24.0 mmHg  MV Area VTI:   1.08 cm     TR Vmax:        245.00 cm/s  MV Peak grad:  17.6 mmHg  MV Mean grad:  9.0 mmHg     SHUNTS  MV Vmax:       2.10 m/s     Systemic VTI:  0.18 m  MV Vmean:      146.0 cm/s   Systemic Diam: 2.00 cm  MV Decel Time: 345 msec  MR Peak grad: 90.2 mmHg  MR Mean grad: 50.0 mmHg  MR Vmax:      475.00 cm/s  MR Vmean:     327.0 cm/s  MV E velocity: 215.00 cm/s  MV A velocity: 142.00 cm/s  MV E/A ratio:  1.51   Riley Lam MD  Electronically signed by Riley Lam MD  Signature Date/Time: 10/05/2022/12:38:59 PM        Final      Treatments: surgery:   CARDIOVASCULAR SURGERY OPERATIVE NOTE   10/24/2022 Melanie Melanie Ray [id number]   Surgeon:  Ashley Akin, MD   First Assistant: Aloha Gell [facility]                               An experienced assistant was required given the complexity of this surgery and the standard of surgical care. The assistant was needed for exposure, dissection, suctioning, retraction of delicate tissues and sutures, instrument exchange and for overall help during this procedure.       Preoperative Diagnosis:  Ischemic Cardiomyopathy with  severe MR following Mitral clip x 2   Postoperative Diagnosis:  Same     Procedure:   Median Sternotomy Extracorporeal circulation 3.    Mitral Valve Replacement using a 29mm Mosaic  valve.[facility] [id number])      OPERATIVE REPORT   DATE OF OPERATION: 10/24/22   SURGEON: Eugenio Hoes MD   ANESTHESIOLOGIST:  Corky Sox MD   INDICATION FOR OPERATION:  Mediastinal bleeding following earlier MV replacement operation   OPERATION PERFORMED: Rexploration for bleeding. Redo sternotomy. Control of pericardial bleeder     Discharge Exam: Blood pressure (!) 98/43, pulse 89, temperature (!) 97.5 F (36.4 C), temperature source Oral,  resp. rate 17, height 5\' 3"  (1.6 m), weight 58.1 kg, SpO2 99 %. General appearance: alert, cooperative, and no distress Neurologic: intact Heart: A-V paced, no murmur Lungs: clear to auscultation bilaterally Abdomen: soft, non-tender; bowel sounds normal; no masses,  no organomegaly Extremities: edema trace Wound: Clean, dry, intact. No erythema or signs of infection   Discharge Medications:  The patient has been discharged on:   1.Beta Blocker:  Yes [   x]                              No   [   ]                              If No, reason:  2.Ace Inhibitor/ARB: Yes [ x  ]                                     No  [    ]                                     If No, reason:  3.Statin:   Yes [  x ]                  No  [   ]                  If No, reason:  4.Ecasa:  Yes  [   x]                  No   [   ]                  If No, reason:  Patient had ACS upon admission: No  Plavix/P2Y12 inhibitor: Yes [   ]                                      No  [  x ] On Coumadin     Discharge Instructions     Amb Referral to Cardiac Rehabilitation   Complete by: As directed    Diagnosis: Valve Replacement   Valve: Mitral   After initial evaluation and assessments completed: Virtual Based Care may be provided alone or in conjunction with Phase 2 Cardiac Rehab based on patient barriers.: Yes   Intensive Cardiac Rehabilitation (ICR) [facility] location only OR Traditional Cardiac Rehabilitation (TCR) *If criteria for ICR are not met will enroll in TCR [facility] only): Yes  Allergies as of 10/30/2022       Reactions   Tetracycline Swelling   Meperidine Nausea And Vomiting   (Demerol)Nausea   Entresto [sacubitril-valsartan] Other (See Comments)   Shortness of Breath        Medication List     STOP taking these medications    isosorbide mononitrate 30 MG 24 hr tablet Commonly known as: IMDUR   nitroGLYCERIN 0.4 MG SL tablet Commonly known as: NITROSTAT   valsartan 80 MG  tablet Commonly known as: DIOVAN       TAKE these medications    Aspirin Low Dose 81 MG tablet Generic drug: aspirin EC Take 1 tablet (81 mg total) by mouth daily. Swallow whole.   atorvastatin 80 MG tablet Commonly known as: LIPITOR Take 1 tablet by mouth once daily   carvedilol 12.5 MG tablet Commonly known as: COREG Take 1 tablet (12.5 mg total) by mouth 2 (two) times daily with a meal. What changed:  medication strength how much to take   dorzolamide-timolol 2-0.5 % ophthalmic solution Commonly known as: COSOPT Place 1 drop into both eyes 2 (two) times daily.   ferrous sulfate 325 (65 FE) MG tablet Take 1 tablet (325 mg total) by mouth daily.   furosemide 40 MG tablet Commonly known as: LASIX Take 1 tablet (40 mg total) by mouth daily. What changed:  medication strength how much to take   irbesartan 75 MG tablet Commonly known as: AVAPRO Take 1/2 tablet (37.5 mg total) by mouth daily.   [name] 10 MG Tabs tablet Generic drug: empagliflozin Take 1 tablet (10 mg total) by mouth daily.   latanoprost 0.005 % ophthalmic solution Commonly known as: XALATAN Place 1 drop into both eyes at bedtime.   lumateperone tosylate 42 MG capsule Commonly known as: CAPLYTA Take 42 mg by mouth at bedtime.   oxyCODONE 5 MG immediate release tablet Commonly known as: Oxy IR/ROXICODONE Take 1 tablet (5 mg total) by mouth every 6 (six) hours as needed for severe pain.   potassium chloride SA 20 MEQ tablet Commonly known as: KLOR-CON M Take 1 tablet (20 mEq total) by mouth daily.   spironolactone 25 MG tablet Commonly known as: ALDACTONE Take 1 tablet (25 mg total) by mouth daily.   traZODone 150 MG tablet Commonly known as: DESYREL Take 150 mg by mouth at bedtime.   venlafaxine XR 150 MG 24 hr capsule Commonly known as: EFFEXOR-XR TAKE 2 CAPSULES BY MOUTH ONCE DAILY (NEEDS to follow-up with psychiatry for further refills) What changed:  how much to take how to  take this when to take this additional instructions   warfarin 3 MG tablet Commonly known as: COUMADIN Take 1 tablet (3 mg total) by mouth daily.               Durable Medical Equipment  (From admission, onward)           Start     Ordered   10/30/22 1247  For home use only DME Shower stool  Once        10/30/22 1246            Follow-up Information     [facility] Follow up on 11/07/2022.   Specialty: Cardiothoracic Surgery Why: Appointment is at 1:30PM Contact information: [address] [place], [address] [place] [address] [phone]        [facility] Follow up on 11/07/2022.   Why: To get chest xray at 12:30,  1 hour prior to your appointment Contact information: [address] [place] [address]        [facility] Follow up.   Specialty: Cardiology Why: Melanie Ray follow-up in CHF Clinic scheduled for 11/08/2022 at 9:30am. Contact information: [address] [id number] [place] [address] [phone]        [facility] [place] Melanie Ray [address] A Dept Of [place]. [facility] Follow up.   Specialty: Cardiology Why: Melanie Ray follow-up with Structural Heart Team on 11/07/2022 at 8:15am. Contact information: [address] [id number] mc [place] [address] [phone]        [facility] [address] [facility] Dept Of [place]. [facility] Follow up on 10/31/2022.   Specialty: Cardiology Why: 2:45PM. Coumadin clinic visit. Contact information: [address] [id number] mc [place] [address] [phone]        [facility] Follow up.   Why: Home Health will call to arrange appt Contact information: [address] [place] [address] [phone]                 Signed:  Jenny Reichmann, PA-C  10/30/2022, 2:17 PM

## 2022-10-25 NOTE — Anesthesia Postprocedure Evaluation (Signed)
Anesthesia Post Note  Patient: Melanie Ray  Procedure(s) Performed: EXPLORATION POST OPERATIVE OPEN HEART     Patient location during evaluation: SICU Anesthesia Type: General Level of consciousness: sedated Pain management: pain level controlled Vital Signs Assessment: post-procedure vital signs reviewed and stable Respiratory status: patient remains intubated per anesthesia plan Cardiovascular status: stable Postop Assessment: no apparent nausea or vomiting Anesthetic complications: no  No notable events documented.  Last Vitals:  Vitals:   10/25/22 0645 10/25/22 0700  BP:  128/61  Pulse: 93 96  Resp: 12 (!) 23  Temp: 37.7 C 37.9 C  SpO2: 92% 94%    Last Pain:  Vitals:   10/25/22 0710  TempSrc:   PainSc: 2                  Melanie Ray

## 2022-10-25 NOTE — TOC Initial Note (Addendum)
Transition of Care [facility]) - Initial/Assessment Note    Patient Details  Name: Melanie Ray MRN: [id number] Date of Birth: [birth year]  Transition of Care [facility]) CM/SW Contact:    Erenest Rasher, RN Phone Number: [phone] 10/25/2022, 5:01 PM  Clinical Narrative:                 HF TOC CM spoke to pt, sister and husband at bedside. Pt states she does not believe she will need RW. Offered choice for [facility]. States she had [facility] in the past. Will need HH RN and [facility] PT orders with F2F.   Planned Disposition: Home with Health Care Svc     Patient Goals and CMS Choice   CMS Medicare.gov Compare Post Acute Care list provided to:: Patient Choice offered to / list presented to : Patient      Expected Discharge Plan and Services Planned Disposition: Home with Health Care Svc     Post Acute Care Choice: Home Health                             HH Arranged: RN, PT [facility] Agency: [place] ([facility]) Date [place]: 10/25/22 Time [place]: [place] Representative spoke with at [place]  Prior Living Arrangements/Services                       Activities of Daily Living      Permission Sought/Granted                  Emotional Assessment              Admission diagnosis:  S/P mitral valve replacement [Z95.2] Patient Active Problem List   Diagnosis Date Noted   History of depression 10/25/2022   Chronic HFrEF (heart failure with reduced ejection fraction) ([place]) 10/25/2022   S/P mitral valve replacement 10/24/2022   Endotracheal tube present 10/24/2022   ABLA (acute blood loss anemia) 10/24/2022   HFrEF (heart failure with reduced ejection fraction) ([place]) 10/24/2022   Postprocedural hypotension 10/24/2022   Severe mitral regurgitation 10/14/2022   Acute on chronic systolic CHF (congestive heart failure) ([place]) 10/14/2022   Acute on chronic combined systolic and diastolic CHF (congestive heart failure)  ([place]) 10/05/2022   S/P mitral valve clip implantation 10/04/2022   Severe mitral insufficiency 10/04/2022   Allergic rhinitis 09/25/2022   COVID-19 06/27/2022   ICD (implantable cardioverter-defibrillator) in place 01/02/2022   Finger fracture 12/19/2021   Anemia, unspecified 11/02/2021   Memory difficulty 06/28/2020   Cerebellar stroke, acute ([place]) 03/30/2020   Nightmares 03/01/2020   Left bundle branch block 12/03/2018   QT prolongation 12/03/2018   Obsessive compulsive disorder 08/27/2018   Rash 02/07/2018   Hot flash, menopausal 02/07/2018   Vitamin D deficiency 06/17/2017   Class 1 obesity without serious comorbidity with body mass index (BMI) of 30.0 to 30.9 in adult 06/17/2017   Dizziness 01/03/2017   Ischemic cardiomyopathy 10/02/2016   CAD (coronary artery disease) 84/16/6063   Chronic systolic CHF (congestive heart failure) ([place]) 03/21/2016   History of acute anterior wall MI 03/18/2016   DM type 2 (diabetes mellitus, type 2) ([place]) 08/03/2015   Essential hypertension 07/28/2015   HLD (hyperlipidemia) 07/28/2015   Glaucoma 07/28/2015   Depression 07/28/2015   Anxiety 07/28/2015   Insomnia 07/28/2015   Overweight (BMI 25.0-29.9) 07/28/2015   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   Uterine  leiomyoma 04/27/2008   Osteopenia 04/27/2008   Other specified disorder of bladder 04/27/2008   PCP:  Leone Haven, MD Pharmacy:   [facility] [address] [place] - [place] [address] [place] [address] Phone: [phone] Fax: [place] [address] [place] [address] Phone: [phone] Fax: [phone]     Social Determinants of Health (SDOH) Social History: [place]: No Food Insecurity (10/17/2022)  Housing: Low Risk  (10/17/2022)  Transportation Needs: No Transportation Needs (10/17/2022)  Utilities: Not At Risk (10/15/2022)  Depression (PHQ2-9):  Medium Risk (09/25/2022)  Financial Resource Strain: Low Risk  (03/30/2022)  Social Connections: Unknown (03/30/2022)  Stress: No Stress Concern Present (03/30/2022)  Tobacco Use: Medium Risk (10/25/2022)   SDOH Interventions:     Readmission Risk Interventions     No data to display

## 2022-10-25 NOTE — Progress Notes (Addendum)
NAME:  Melanie Ray, MRN:  [id number], DOB:  1951-05-24, LOS: 1 ADMISSION DATE:  10/24/2022, CONSULTATION DATE:  10/24/22 REFERRING MD:  Lavonna Monarch, CHIEF COMPLAINT:  s/p MVR   History of Present Illness:  71 yo PMH MR s/p mitraclip x2, HFrEF, HTN, HLD, CAD s/p PCI/DES, LBBB s/p CRT-D presented 10/24/22 for planned MVR. Total pump time 90 min Xclamp 66 min. EBL 1323m, received  6689mcell saver.  Returned to ICU intubated sedated post operative as per plan. Post op course c/b hypotension, high output from chest tube. Patient taken for re-exploration and was found to have bleeding pericardial vessel, which was cauterized with good hemostasis. EBL 34044mReceived 1 PRBC 1 cryo 1 plt 170m51mll saver   Returns to ICU intubated sedated per plan.   PCCM consulted for post op care   Pertinent  Medical History  Severe MR HFrEF/NICM CAD HTN HLD LBBB  S/p CRT-D  Significant Hospital Events: Including procedures, antibiotic start and stop dates in addition to other pertinent events   12/20 MVR. Re-exploration for mediastinal bleeding. Pericardial vessel cauterized.   Interim History / Subjective:  Pulled out her Foley fully inflated this morning.  Denies complaints.  Tmax 103.3--potentially related to [facility], remains on milrinone.   Objective   Blood pressure 128/61, pulse 96, temperature 100.2 F (37.9 C), resp. rate (!) 23, height '5\' 3"'$  (1.6 m), weight 63.9 kg, SpO2 94 %. PAP: (13-37)/(4-26) 20/14 CVP:  [0 mmHg-28 mmHg] 3 mmHg CO:  [3.4 L/min-5.9 L/min] 5.7 L/min CI:  [2.1 L/min/m2-3.7 L/min/m2] 3.5 L/min/m2  Vent Mode: PSV;CPAP FiO2 (%):  [40 %-50 %] 40 % Set Rate:  [4 bmp-12 bmp] 4 bmp Vt Set:  [420 mL-450 mL] 420 mL PEEP:  [5 cmH20] 5 cmH20 Pressure Support:  [10 cmH20] 10 cmH20 Plateau Pressure:  [16 cmH20] 16 cmH20   Intake/Output Summary (Last 24 hours) at 10/25/2022 0711 Last data filed at 10/25/2022 0700 Gross per 24 hour  Intake 7217.6 ml  Output  4145 ml  Net 3072.6 ml    Filed Weights   10/24/22 0731 10/25/22 0500  Weight: 59 kg 63.9 kg    Examination: General: Elderly woman sitting up in bed no acute distress HENT: [place]/AT, eyes anicteric.  Mild periorbital edema. Lungs: Faint rales, breathing comfortably on nasal cannula. Cardiovascular: Paced rhythm, S1-S2, 2/6 mitral murmur Abdomen: Soft, nontender, not distended Extremities: Mild pedal edema. No cyanosis. Neuro: awake, alert, moving all extremities.  Derm: warm, dry   Co. ox 70% on milrinone 0.375 BUN 15 Creatinine 0.87 WBC 7.6 H/H 8.7/25.6 Platelets 125 CXR personally reviewed-no infiltrates, [place]  EKG: AV paced,  TWI V2 (new post-op), AVL (old)  Resolved Hospital Problem list     Assessment & Plan:   Severe MR with prior mitraclip x2, now s/p MVR Mediastinal bleeding s/p re-exploration and vessel cautery  Expected post bypass vasoplegia / hypotension  -Discontinue milrinone today - Start Coreg twice daily. Can resume Imdur if BP allows. -Weaning off nitroglycerin - Discontinue chest tubes later today potentially.  Defer to T CTS. - AICD has been turned back on postoperatively. - Remove pacing wires today. - Self removed Foley today. - Out of bed mobility - Pain control per protocol-oxycodone, tramadol, morphine as needed. - Complete postop antibiotics. - Aspirin, statin  Expected post operative mechanical ventilation ?hx OSA  -Diuresis - Wean supplemental oxygen - Out of bed mobility  Chronic HFrEF -Appreciate advanced heart failure's management - Coreg, stopping milrinone today -  Can resume Imdur, valsartan, spironolactone as blood pressure tolerates  Expected post op ABLA  Chronic anemia -Bleeding improved, monitor chest tube output. - Defer to transfusion decisions to T CTS. -Continue to monitor  CAD HTN HLD LBBB s/p CRT-D -Aspirin, statin, adding Coreg. - AICD turned back on last evening. -Resume warfarin, let INR drift  up.  Hypokalemia, resolved -Monitor  Anxiety ?OCD -Resume Effexor and trazodone today - Has to bring [place]ta from home  Best Practice (right click and "Reselect all SmartList Selections" daily)   Diet/type: Regular consistency (see orders) DVT prophylaxis: LMWH GI prophylaxis: PPI Lines: Central line Foley:  N/A Code Status:  full code Last date of multidisciplinary goals of care discussion [--]  Labs   CBC: Recent Labs  Lab 10/24/22 0721 10/24/22 0918 10/24/22 1131 10/24/22 1201 10/24/22 1318 10/24/22 1412 10/24/22 1525 10/24/22 1526 10/24/22 2053 10/24/22 2055 10/25/22 0448  WBC 7.4  --   --   --  8.4  --  6.4  --   --  5.7 7.6  HGB 11.0*   < > 8.5*   < > 9.7*   < > 9.1* 8.5* 8.8* 9.7* 8.7*  HCT 34.1*   < > 25.4*   < > 28.7*   < > 27.2* 25.0* 26.0* 28.1* 25.6*  MCV 88.1  --   --   --  82.9  --  85.5  --   --  83.6 84.8  PLT 241  --  143*  --  131*  --  122*  --   --  104* 125*   < > = values in this interval not displayed.     Basic Metabolic Panel: Recent Labs  Lab 10/24/22 0721 10/24/22 0918 10/24/22 1008 10/24/22 1022 10/24/22 1103 10/24/22 1201 10/24/22 1204 10/24/22 1412 10/24/22 1526 10/24/22 2053 10/24/22 2055 10/25/22 0448  NA 143   < > 143   < > 159* 141   < > 145 145 144 142 138  K 3.9   < > 3.3*   < > 4.4 4.6   < > 3.8 3.7 4.9 4.7 4.0  CL 114*   < > 110  --  99 106  --   --   --   --  116* 109  CO2 23  --   --   --   --   --   --   --   --   --  21* 21*  GLUCOSE 118*   < > 138*  --  170* 174*  --   --   --   --  132* 143*  BUN 26*   < > 20  --  20 19  --   --   --   --  17 15  CREATININE 1.29*   < > 0.70  --  0.80 0.70  --   --   --   --  0.83 0.87  CALCIUM 9.8  --   --   --   --   --   --   --   --   --  7.7* 8.5*  MG  --   --   --   --   --   --   --   --   --   --  3.7* 2.6*   < > = values in this interval not displayed.    GFR: Estimated Creatinine Clearance: 52.6 mL/min (by C-G formula based on SCr of 0.87 mg/dL). Recent Labs  Lab 10/24/22 1318 10/24/22 1525 10/24/22 2055 10/25/22 0448  WBC 8.4 6.4 5.7 7.6     Liver Function Tests: Recent Labs  Lab 10/24/22 0721  AST 28  ALT 25  ALKPHOS 105  BILITOT 0.8  PROT 6.7  ALBUMIN 3.7    No results for input(s): "LIPASE", "AMYLASE" in the last 168 hours. No results for input(s): "AMMONIA" in the last 168 hours.   Patient discussed during multidisciplinary rounds of TCTS.  Julian Hy, DO 10/25/22 9:51 AM [facility]  For contact information, see Amion. If no response to pager, please call PCCM consult pager. After hours, 7PM- 7AM, please call [place].

## 2022-10-25 NOTE — Progress Notes (Signed)
Patient ID: Melanie Ray, female   DOB: May 20, 1951, 71 y.o.   MRN: [id number]  TCTS Evening Rounds:  Hemodynamically stable, AV paced.  Sats 97% on RA  Awake and alert, no complaints.  Diuresing.  CT output low.

## 2022-10-25 NOTE — Discharge Instructions (Addendum)
Discharge Instructions:  1. You may shower, please wash incisions daily with soap and water and keep dry.  If you wish to cover wounds with dressing you may do so but please keep clean and change daily.  No tub baths or swimming until incisions have completely healed.  If your incisions become red or develop any drainage please call our office at [phone]  2. No Driving until cleared by Dr. Nicholos Johns office and you are no longer using narcotic pain medications  3. Monitor your weight daily.. Please use the same scale and weigh at same time... If you gain 5-10 lbs in 48 hours with associated lower extremity swelling, please contact our office at [phone]  4. Fever of 101.5 for at least 24 hours with no source, please contact our office at [phone]  5. Activity- up as tolerated, please walk at least 3 times per day.  Avoid strenuous activity, no lifting, pushing, or pulling with your arms over 8-10 lbs for a minimum of 6 weeks  6. If any questions or concerns arise, please do not hesitate to contact our office at [phone]  Information on my medicine - Coumadin   (Warfarin)  Why was Coumadin prescribed for you? Coumadin was prescribed for you because you have a blood clot or a medical condition that can cause an increased risk of forming blood clots. Blood clots can cause serious health problems by blocking the flow of blood to the heart, lung, or brain. Coumadin can prevent harmful blood clots from forming. As a reminder your indication for Coumadin is:  Blood Clot Prevention after Heart Valve Surgery  What test will check on my response to Coumadin? While on Coumadin (warfarin) you will need to have an INR test regularly to ensure that your dose is keeping you in the desired range. The INR (international normalized ratio) number is calculated from the result of the laboratory test called prothrombin time (PT).  If an INR APPOINTMENT HAS NOT ALREADY BEEN MADE FOR YOU please  schedule an appointment to have this lab work done by your health care provider within 7 days. Your INR goal is usually a number between:  2 to 3 or your provider may give you a more narrow range like 2-2.5.  Ask your health care provider during an office visit what your goal INR is.  What  do you need to  know  About  COUMADIN? Take Coumadin (warfarin) exactly as prescribed by your healthcare provider about the same time each day.  DO NOT stop taking without talking to the doctor who prescribed the medication.  Stopping without other blood clot prevention medication to take the place of Coumadin may increase your risk of developing a new clot or stroke.  Get refills before you run out.  What do you do if you miss a dose? If you miss a dose, take it as soon as you remember on the same day then continue your regularly scheduled regimen the next day.  Do not take two doses of Coumadin at the same time.  Important Safety Information A possible side effect of Coumadin (Warfarin) is an increased risk of bleeding. You should call your healthcare provider right away if you experience any of the following: Bleeding from an injury or your nose that does not stop. Unusual colored urine (red or dark brown) or unusual colored stools (red or black). Unusual bruising for unknown reasons. A serious fall or if you hit your head (even if there is no bleeding).  Some foods or medicines interact with Coumadin (warfarin) and might alter your response to warfarin. To help avoid this: Eat a balanced diet, maintaining a consistent amount of Vitamin K. Notify your provider about major diet changes you plan to make. Avoid alcohol or limit your intake to 1 drink for women and 2 drinks for men per day. (1 drink is 5 oz. wine, 12 oz. beer, or 1.5 oz. liquor.)  Make sure that ANY health care provider who prescribes medication for you knows that you are taking Coumadin (warfarin).  Also make sure the healthcare provider  who is monitoring your Coumadin knows when you have started a new medication including herbals and non-prescription products.  Coumadin (Warfarin)  Major Drug Interactions  Increased Warfarin Effect Decreased Warfarin Effect  Alcohol (large quantities) Antibiotics (esp. Septra/Bactrim, Flagyl, Cipro) Amiodarone (Cordarone) Aspirin (ASA) Cimetidine (Tagamet) Megestrol (Megace) NSAIDs (ibuprofen, naproxen, etc.) Piroxicam (Feldene) Propafenone (Rythmol SR) Propranolol (Inderal) Isoniazid (INH) Posaconazole (Noxafil) Barbiturates (Phenobarbital) Carbamazepine (Tegretol) Chlordiazepoxide (Librium) Cholestyramine (Questran) Griseofulvin Oral Contraceptives Rifampin Sucralfate (Carafate) Vitamin K   Coumadin (Warfarin) Major Herbal Interactions  Increased Warfarin Effect Decreased Warfarin Effect  Garlic Ginseng Ginkgo biloba Coenzyme Q10 Green tea St. John's wort    Coumadin (Warfarin) FOOD Interactions  Eat a consistent number of servings per week of foods HIGH in Vitamin K (1 serving =  cup)  Collards (cooked, or boiled & drained) Kale (cooked, or boiled & drained) Mustard greens (cooked, or boiled & drained) Parsley *serving size only =  cup Spinach (cooked, or boiled & drained) Swiss chard (cooked, or boiled & drained) Turnip greens (cooked, or boiled & drained)  Eat a consistent number of servings per week of foods MEDIUM-HIGH in Vitamin K (1 serving = 1 cup)  Asparagus (cooked, or boiled & drained) Broccoli (cooked, boiled & drained, or raw & chopped) Brussel sprouts (cooked, or boiled & drained) *serving size only =  cup Lettuce, raw (green leaf, endive, romaine) Spinach, raw Turnip greens, raw & chopped   These websites have more information on Coumadin (warfarin):  FailFactory.se; VeganReport.com.au;

## 2022-10-25 NOTE — Anesthesia Preprocedure Evaluation (Signed)
Anesthesia Evaluation  Patient identified by MRN, date of birth, ID band Patient unresponsive    Reviewed: Allergy & Precautions, NPO status , Patient's Chart, lab work & pertinent test results, reviewed documented beta blocker date and time Preop documentation limited or incomplete due to emergent nature of procedure.  History of Anesthesia Complications Negative for: history of anesthetic complications  Airway Mallampati: Intubated       Dental  (+) Teeth Intact, Dental Advisory Given   Pulmonary shortness of breath, sleep apnea , former smoker   breath sounds clear to auscultation       Cardiovascular hypertension, Pt. on home beta blockers and Pt. on medications + CAD, + Past MI, + Cardiac Stents and +CHF  + dysrhythmias (LBBB) + Cardiac Defibrillator + Valvular Problems/Murmurs MR  Rhythm:Regular  Echo 08/02/22: 1. Left ventricular ejection fraction, by estimation, is 20 to 25%. The  left ventricle has severely decreased function. The left ventricular  internal cavity size was moderately dilated.   2. Right ventricular systolic function is moderately reduced. The right  ventricular size is normal.   3. Left atrial size was severely dilated. No left atrial/left atrial  appendage thrombus was detected.   4. Right atrial size was mildly dilated.   5. MV is decgenative. Mildly thickened. Posterior leaflet restricted. 3+  central MR. The mitral valve is degenerative. Moderate mitral valve  regurgitation.   6. Tricuspid valve regurgitation is mild to moderate.   7. The aortic valve is tricuspid. Aortic valve regurgitation is mild.   8. There is mild (Grade II) plaque.     Neuro/Psych  PSYCHIATRIC DISORDERS Anxiety Depression    CVA, No Residual Symptoms    GI/Hepatic negative GI ROS, Neg liver ROS,,,  Endo/Other  diabetes, Type 2    Renal/GU negative Renal ROS     Musculoskeletal negative musculoskeletal ROS (+)     Abdominal   Peds  Hematology  (+) Blood dyscrasia (Plavix), anemia Lab Results      Component                Value               Date                      WBC                      7.4                 10/24/2022                HGB                      10.5 (L)            10/24/2022                HCT                      31.0 (L)            10/24/2022                MCV                      88.1                10/24/2022  PLT                      241                 10/24/2022              Anesthesia Other Findings Return to OR for chest tube output s/p MVR  Reproductive/Obstetrics                             Anesthesia Physical Anesthesia Plan  ASA: 5 and emergent  Anesthesia Plan: General   Post-op Pain Management:    Induction: Intravenous  PONV Risk Score and Plan: 3 and Treatment may vary due to age or medical condition  Airway Management Planned: Oral ETT  Additional Equipment: Arterial line, CVP and PA Cath  Intra-op Plan:   Post-operative Plan: Post-operative intubation/ventilation  Informed Consent:   Plan Discussed with: CRNA  Anesthesia Plan Comments:        Anesthesia Quick Evaluation

## 2022-10-25 NOTE — Progress Notes (Signed)
Advanced Heart Failure Rounding Note   Subjective:    Extubated last night.   Hemodynamics look great. Milrinone off this am.   No further bleeding.   Foley self d/c'd this am. Not passing gas yet.   PAP: (13-37)/(4-26) 20/14 CVP:  [0 mmHg-28 mmHg] 3 mmHg CO:  [3.4 L/min-5.9 L/min] 5.7 L/min CI:  [2.1 L/min/m2-3.7 L/min/m2] 3.5 L/min/m2  Objective:   Weight Range:  Vital Signs:   Temp:  [93.9 F (34.4 C)-103.3 F (39.6 C)] 100.2 F (37.9 C) (12/21 0700) Pulse Rate:  [67-101] 96 (12/21 0700) Resp:  [8-28] 23 (12/21 0700) BP: (117-144)/(57-69) 128/61 (12/21 0700) SpO2:  [92 %-100 %] 94 % (12/21 0700) Arterial Line BP: (101-198)/(43-72) 174/49 (12/21 0700) FiO2 (%):  [40 %-50 %] 40 % (12/20 2010) Weight:  [63.9 kg] 63.9 kg (12/21 0500) Last BM Date :  (pta)  Weight change: Filed Weights   10/24/22 0731 10/25/22 0500  Weight: 59 kg 63.9 kg    Intake/Output:   Intake/Output Summary (Last 24 hours) at 10/25/2022 0825 Last data filed at 10/25/2022 0700 Gross per 24 hour  Intake 7217.6 ml  Output 4145 ml  Net 3072.6 ml     Physical Exam: General:  Sitting up in bed HEENT: normal Neck: supple. RIJ swan Carotids 2+ bilat; no bruits. No lymphadenopathy or thryomegaly appreciated. Cor: Sternal dressing ok Regular rate & rhythm. No rubs, gallops or murmurs. Lungs: clear Abdomen: soft, nontender, nondistended. No hepatosplenomegaly. No bruits or masses. Good bowel sounds. Extremities: no cyanosis, clubbing, rash, edema Neuro: alert & orientedx3, cranial nerves grossly intact. moves all 4 extremities w/o difficulty. Affect pleasant  Telemetry: AV paced 90s Personally reviewed  Labs: Basic Metabolic Panel: Recent Labs  Lab 10/24/22 0721 10/24/22 0918 10/24/22 1008 10/24/22 1022 10/24/22 1103 10/24/22 1201 10/24/22 1204 10/24/22 1412 10/24/22 1526 10/24/22 2053 10/24/22 2055 10/25/22 0448  NA 143   < > 143   < > 159* 141   < > 145 145 144 142 138   K 3.9   < > 3.3*   < > 4.4 4.6   < > 3.8 3.7 4.9 4.7 4.0  CL 114*   < > 110  --  99 106  --   --   --   --  116* 109  CO2 23  --   --   --   --   --   --   --   --   --  21* 21*  GLUCOSE 118*   < > 138*  --  170* 174*  --   --   --   --  132* 143*  BUN 26*   < > 20  --  20 19  --   --   --   --  17 15  CREATININE 1.29*   < > 0.70  --  0.80 0.70  --   --   --   --  0.83 0.87  CALCIUM 9.8  --   --   --   --   --   --   --   --   --  7.7* 8.5*  MG  --   --   --   --   --   --   --   --   --   --  3.7* 2.6*   < > = values in this interval not displayed.    Liver Function Tests: Recent Labs  Lab 10/24/22 0721  AST  28  ALT 25  ALKPHOS 105  BILITOT 0.8  PROT 6.7  ALBUMIN 3.7   No results for input(s): "LIPASE", "AMYLASE" in the last 168 hours. No results for input(s): "AMMONIA" in the last 168 hours.  CBC: Recent Labs  Lab 10/24/22 0721 10/24/22 0918 10/24/22 1131 10/24/22 1201 10/24/22 1318 10/24/22 1412 10/24/22 1525 10/24/22 1526 10/24/22 2053 10/24/22 2055 10/25/22 0448  WBC 7.4  --   --   --  8.4  --  6.4  --   --  5.7 7.6  HGB 11.0*   < > 8.5*   < > 9.7*   < > 9.1* 8.5* 8.8* 9.7* 8.7*  HCT 34.1*   < > 25.4*   < > 28.7*   < > 27.2* 25.0* 26.0* 28.1* 25.6*  MCV 88.1  --   --   --  82.9  --  85.5  --   --  83.6 84.8  PLT 241  --  143*  --  131*  --  122*  --   --  104* 125*   < > = values in this interval not displayed.    Cardiac Enzymes: No results for input(s): "CKTOTAL", "CKMB", "CKMBINDEX", "TROPONINI" in the last 168 hours.  BNP: BNP (last 3 results) Recent Labs    05/07/22 1853 07/17/22 1458 10/14/22 1130  BNP 2,597.3* 2,184.7* 1,220.2*    ProBNP (last 3 results) Recent Labs    12/19/21 1107 10/01/22 1220 10/10/22 0923  PROBNP 2,342.0* 13,775* 9,776*      Other results:  Imaging: DG Chest Port 1 View  Result Date: 10/24/2022 CLINICAL DATA:  Status post mitral valve replacement EXAM: PORTABLE CHEST 1 VIEW COMPARISON:  Chest x-ray dated  October 14, 2022 FINDINGS: ETT tip is proximally 2 cm from the carina. Gastric decompression tube partially seen coursing below the diaphragm. Mediastinal drain. Right IJ approach PA catheter with tip overlying the expected area of the right pulmonary artery. Left AICD with unchanged lead position. Cardiac and mediastinal contours are within normal limits status post median sternotomy. Lungs are clear. No pleural effusion or pneumothorax. IMPRESSION: 1. Support apparatus as above. 2. No acute cardiopulmonary process. Electronically Signed   By: Yetta Glassman M.D.   On: 10/24/2022 16:04     Medications:     Scheduled Medications:  acetaminophen  1,000 mg Oral Q6H   Or   acetaminophen (TYLENOL) oral liquid 160 mg/5 mL  1,000 mg Per Tube Q6H   aspirin EC  81 mg Oral Daily   atorvastatin  80 mg Oral Daily   bisacodyl  10 mg Oral Daily   Or   bisacodyl  10 mg Rectal Daily   carvedilol  6.25 mg Oral BID WC   Chlorhexidine Gluconate Cloth  6 each Topical Daily   docusate sodium  200 mg Oral Daily   dorzolamide-timolol  1 drop Both Eyes BID   enoxaparin (LOVENOX) injection  40 mg Subcutaneous QHS   furosemide  40 mg Intravenous Q12H   insulin aspart  0-24 Units Subcutaneous Q4H   latanoprost  1 drop Both Eyes QHS   metoCLOPramide (REGLAN) injection  10 mg Intravenous Q6H   [START ON 10/26/2022] pantoprazole  40 mg Oral Daily   potassium chloride  20 mEq Oral BID   sodium chloride flush  10-40 mL Intracatheter Q12H   sodium chloride flush  3 mL Intravenous Q12H   venlafaxine XR  300 mg Oral Q breakfast   warfarin  2 mg Oral ONCE-1600  Warfarin - Physician Dosing Inpatient   Does not apply q1600    Infusions:  sodium chloride 20 mL/hr at 10/25/22 0700   sodium chloride     sodium chloride 10 mL/hr at 10/25/22 0700   albumin human 60 mL/hr at 10/25/22 0700    ceFAZolin (ANCEF) IV Stopped (10/25/22 0532)   dexmedetomidine (PRECEDEX) IV infusion Stopped (10/24/22 1933)   insulin 1.4  Units/hr (10/25/22 0700)   lactated ringers     lactated ringers     lactated ringers 20 mL/hr at 10/25/22 0700   nitroGLYCERIN 100 mcg/min (10/25/22 0700)   norepinephrine (LEVOPHED) Adult infusion Stopped (10/24/22 1625)   phenylephrine (NEO-SYNEPHRINE) Adult infusion     vasopressin      PRN Medications: sodium chloride, albumin human, dextrose, lactated ringers, metoprolol tartrate, midazolam, morphine injection, mouth rinse, oxyCODONE, prochlorperazine, sodium chloride flush, sodium chloride flush, traMADol   Assessment/Plan:    1. Mitral regurgitation, severe - s/p mTEER with 2 clips on 10/04/22 - Post op echo EF 25% with moderate to severe MR felt secondary to SLDA of the second MitraClip placement. - S/p MVR with 29 mm Mosaic valve by Dr. Lavonna Monarch. Returned to OR  for control of bleeding.  - Hemodynamics look good. Agree with stopping milrinone Pull swan - Post-op care per TCTS. Ambulate. Encouraged IS.   2. Chronic systolic HF - initially felt due to ischemic CM. S/p Anterior STEMI 5/17. Now suspect MR major contributing factor. - Echo (8/19): EF 30-35%. - cMRI 1/23 EF 15% No LGE. RV ok. Severe LV dyssynchrony Moderate MR  - CRT-D placed 01/02/22 with LBBB lead by Dr. Quentin Ore - Echo 07/17/22 EF 20-25% G2DD, RV ok. Severe central MR  - s/p mTEER with 2 clips on 10/04/22 - Echo 10/05/22: EF 25% with moderate to severe MR felt secondary to SLDA of the second MitraClip placement.  - RHC 12/23: RA 4, PA 47/20 (33) PCWP 13 CO 5.2 CI 3.1  PVR 2.1  - S/p MVR 10/24/22 - Stop milrinone today  - Weight up 10 pounds post-op. Give lasix '40mg'$  IV x bid today  3. Coronary artery disease involving native coronary artery of native heart without angina pectoris - Hx of anterior STEMI in 5/17 tx with DES x 2 to the LAD.   - No LGE on cMRI (1/23) - LHC 9/23 patent LAD stents - On aspirin + statin - No angina  4. LBBB - s/p CRT-D 3/23 with Dr. Quentin Ore  - EF has not improved   5.  Post-op ABLA - Returned to OR this afternoon for control of bleeding as above - Hgb stable    Length of Stay: 1   Glori Bickers MD 10/25/2022, 8:25 AM  Advanced Heart Failure Team Pager [phone] (M-F; 7a - 4p)  Please contact [facility] for night-coverage after hours (4p -7a ) and weekends on [email or web address]

## 2022-10-26 ENCOUNTER — Inpatient Hospital Stay (HOSPITAL_COMMUNITY): Payer: Medicare Other

## 2022-10-26 DIAGNOSIS — Z952 Presence of prosthetic heart valve: Secondary | ICD-10-CM | POA: Diagnosis not present

## 2022-10-26 DIAGNOSIS — I5022 Chronic systolic (congestive) heart failure: Secondary | ICD-10-CM | POA: Diagnosis not present

## 2022-10-26 DIAGNOSIS — R739 Hyperglycemia, unspecified: Secondary | ICD-10-CM

## 2022-10-26 DIAGNOSIS — I5023 Acute on chronic systolic (congestive) heart failure: Secondary | ICD-10-CM

## 2022-10-26 HISTORY — DX: Hyperglycemia, unspecified: R73.9

## 2022-10-26 LAB — CBC
HCT: 27.9 % — ABNORMAL LOW (ref 36.0–46.0)
Hemoglobin: 9.4 g/dL — ABNORMAL LOW (ref 12.0–15.0)
MCH: 28.7 pg (ref 26.0–34.0)
MCHC: 33.7 g/dL (ref 30.0–36.0)
MCV: 85.3 fL (ref 80.0–100.0)
Platelets: 115 10*3/uL — ABNORMAL LOW (ref 150–400)
RBC: 3.27 MIL/uL — ABNORMAL LOW (ref 3.87–5.11)
RDW: 16 % — ABNORMAL HIGH (ref 11.5–15.5)
WBC: 9.4 10*3/uL (ref 4.0–10.5)
nRBC: 0 % (ref 0.0–0.2)

## 2022-10-26 LAB — BASIC METABOLIC PANEL
Anion gap: 6 (ref 5–15)
BUN: 19 mg/dL (ref 8–23)
CO2: 24 mmol/L (ref 22–32)
Calcium: 8.9 mg/dL (ref 8.9–10.3)
Chloride: 108 mmol/L (ref 98–111)
Creatinine, Ser: 0.95 mg/dL (ref 0.44–1.00)
GFR, Estimated: >60 mL/min (ref >60)
Glucose, Bld: 112 mg/dL — ABNORMAL HIGH (ref 70–99)
Potassium: 3.9 mmol/L (ref 3.5–5.1)
Sodium: 138 mmol/L (ref 135–145)

## 2022-10-26 LAB — GLUCOSE, CAPILLARY
Glucose-Capillary: 110 mg/dL — ABNORMAL HIGH (ref 70–99)
Glucose-Capillary: 171 mg/dL — ABNORMAL HIGH (ref 70–99)
Glucose-Capillary: 123 mg/dL — ABNORMAL HIGH (ref 70–99)
Glucose-Capillary: 92 mg/dL (ref 70–99)

## 2022-10-26 LAB — COOXEMETRY PANEL
Carboxyhemoglobin: 2.7 % — ABNORMAL HIGH (ref 0.5–1.5)
Methemoglobin: <0.7 % (ref 0.0–1.5)
O2 Saturation: 78.2 %
Total hemoglobin: 7.2 g/dL — ABNORMAL LOW (ref 12.0–16.0)

## 2022-10-26 LAB — PROTIME-INR
INR: 1.5 — ABNORMAL HIGH (ref 0.8–1.2)
Prothrombin Time: 17.8 seconds — ABNORMAL HIGH (ref 11.4–15.2)

## 2022-10-26 MED ORDER — BUMETANIDE 0.25 MG/ML IJ SOLN
0.5000 mg | Freq: Once | INTRAMUSCULAR | Status: DC
  Filled 2022-10-26: qty 2

## 2022-10-26 MED ORDER — SODIUM CHLORIDE 0.9% FLUSH
3.0000 mL | Freq: Two times a day (BID) | INTRAVENOUS | Status: DC
  Administered 2022-10-26 – 2022-10-30 (×8): 3 mL via INTRAVENOUS

## 2022-10-26 MED ORDER — SENNOSIDES-DOCUSATE SODIUM 8.6-50 MG PO TABS: 1.0000 | ORAL_TABLET | Freq: Two times a day (BID) | ORAL | Status: DC | PRN

## 2022-10-26 MED ORDER — SODIUM CHLORIDE 0.9 % IV SOLN: 250.0000 mL | INTRAVENOUS | Status: DC | PRN

## 2022-10-26 MED ORDER — FUROSEMIDE 10 MG/ML IJ SOLN
40.0000 mg | Freq: Two times a day (BID) | INTRAMUSCULAR | Status: DC
  Administered 2022-10-26 – 2022-10-27 (×2): 40 mg via INTRAVENOUS
  Filled 2022-10-26 (×2): qty 4

## 2022-10-26 MED ORDER — POLYETHYLENE GLYCOL 3350 17 G PO PACK
17.0000 g | PACK | Freq: Every day | ORAL | Status: DC
  Administered 2022-10-26: 17 g via ORAL
  Filled 2022-10-26: qty 1

## 2022-10-26 MED ORDER — SPIRONOLACTONE 25 MG PO TABS
25.0000 mg | ORAL_TABLET | Freq: Once | ORAL | Status: AC
  Administered 2022-10-26: 25 mg via ORAL
  Filled 2022-10-26: qty 1

## 2022-10-26 MED ORDER — IRBESARTAN 75 MG PO TABS
37.5000 mg | ORAL_TABLET | Freq: Every day | ORAL | Status: DC
  Administered 2022-10-26 – 2022-10-30 (×5): 37.5 mg via ORAL
  Filled 2022-10-26 (×5): qty 0.5

## 2022-10-26 MED ORDER — SODIUM CHLORIDE 0.9% FLUSH: 3.0000 mL | INTRAVENOUS | Status: DC | PRN

## 2022-10-26 MED ORDER — BUMETANIDE 0.25 MG/ML IJ SOLN
2.0000 mg | Freq: Once | INTRAMUSCULAR | Status: AC
  Administered 2022-10-26: 2 mg via INTRAVENOUS
  Filled 2022-10-26: qty 8

## 2022-10-26 MED ORDER — CARVEDILOL 12.5 MG PO TABS
12.5000 mg | ORAL_TABLET | Freq: Two times a day (BID) | ORAL | Status: DC
  Administered 2022-10-26 – 2022-10-29 (×8): 12.5 mg via ORAL
  Filled 2022-10-26 (×9): qty 1

## 2022-10-26 MED ORDER — BUMETANIDE 0.25 MG/ML IJ SOLN
2.0000 mg | Freq: Once | INTRAMUSCULAR | Status: DC
  Filled 2022-10-26: qty 8

## 2022-10-26 MED ORDER — LOPERAMIDE HCL 2 MG PO CAPS
4.0000 mg | ORAL_CAPSULE | ORAL | Status: DC | PRN
  Administered 2022-10-26 (×2): 4 mg via ORAL
  Filled 2022-10-26 (×2): qty 2

## 2022-10-26 MED ORDER — WARFARIN SODIUM 2.5 MG PO TABS: 2.5000 mg | ORAL_TABLET | Freq: Every day | ORAL | Status: DC

## 2022-10-26 MED ORDER — ~~LOC~~ CARDIAC SURGERY, PATIENT & FAMILY EDUCATION: Freq: Once | Status: DC

## 2022-10-26 MED ORDER — WARFARIN SODIUM 2.5 MG PO TABS
2.5000 mg | ORAL_TABLET | Freq: Every day | ORAL | Status: DC
  Administered 2022-10-26: 2.5 mg via ORAL
  Filled 2022-10-26: qty 1

## 2022-10-26 MED ORDER — WARFARIN SODIUM 2 MG PO TABS: 2.0000 mg | ORAL_TABLET | Freq: Once | ORAL | Status: DC

## 2022-10-26 MED ORDER — SORBITOL 70 % SOLN
30.0000 mL | Freq: Once | Status: AC
  Administered 2022-10-26: 30 mL via ORAL
  Filled 2022-10-26: qty 30

## 2022-10-26 NOTE — Progress Notes (Addendum)
Advanced Heart Failure Rounding Note   Subjective:    Co-ox 78% off milrinone.   Up 8 lb from pre-op. No dyspnea.   SCr 0.95.   BPs mildly elevated.  Appetite ok. No BM yet. Denies d/v. Ambulating ok.   Objective:   Weight Range:  Vital Signs:   Temp:  [97.6 F (36.4 C)-99.4 F (37.4 C)] 99.4 F (37.4 C) (12/22 0751) Pulse Rate:  [88-103] 101 (12/22 0751) Resp:  [13-32] 21 (12/22 0751) BP: (82-164)/(38-111) 140/111 (12/22 0700) SpO2:  [94 %-99 %] 96 % (12/22 0751) Weight:  [62.5 kg] 62.5 kg (12/22 0500) Last BM Date : 10/24/22 (Per patient)  Weight change: Filed Weights   10/24/22 0731 10/25/22 0500 10/26/22 0500  Weight: 59 kg 63.9 kg 62.5 kg    Intake/Output:   Intake/Output Summary (Last 24 hours) at 10/26/2022 0801 Last data filed at 10/26/2022 0525 Gross per 24 hour  Intake 865.88 ml  Output 1900 ml  Net -1034.12 ml     PHYSICAL EXAM: General:  Well appearing. No respiratory difficulty HEENT: normal Neck: supple. JVD 7-8 cm. Carotids 2+ bilat; no bruits. No lymphadenopathy or thyromegaly appreciated. Cor: PMI nondisplaced. Regular rhythm, mildly tach rate. No rubs, gallops or murmurs. + CTs Lungs: clear Abdomen: soft, nontender, nondistended. No hepatosplenomegaly. No bruits or masses. Good bowel sounds. Extremities: no cyanosis, clubbing, rash, edema Neuro: alert & oriented x 3, cranial nerves grossly intact. moves all 4 extremities w/o difficulty. Affect pleasant.   Telemetry: AV paced low 100s Personally reviewed  Labs: Basic Metabolic Panel: Recent Labs  Lab 10/24/22 0721 10/24/22 0918 10/24/22 1201 10/24/22 1204 10/24/22 2053 10/24/22 2055 10/25/22 0448 10/25/22 1935 10/26/22 0358  NA 143   < > 141   < > 144 142 138 139 138  K 3.9   < > 4.6   < > 4.9 4.7 4.0 4.0 3.9  CL 114*   < > 106  --   --  116* 109 109 108  CO2 23  --   --   --   --  21* 21* 23 24  GLUCOSE 118*   < > 174*  --   --  132* 143* 154* 112*  BUN 26*   < > 19   --   --  '17 15 20 19  '$ CREATININE 1.29*   < > 0.70  --   --  0.83 0.87 1.11* 0.95  CALCIUM 9.8  --   --   --   --  7.7* 8.5* 8.8* 8.9  MG  --   --   --   --   --  3.7* 2.6* 2.3  --    < > = values in this interval not displayed.    Liver Function Tests: Recent Labs  Lab 10/24/22 0721  AST 28  ALT 25  ALKPHOS 105  BILITOT 0.8  PROT 6.7  ALBUMIN 3.7   No results for input(s): "LIPASE", "AMYLASE" in the last 168 hours. No results for input(s): "AMMONIA" in the last 168 hours.  CBC: Recent Labs  Lab 10/24/22 1525 10/24/22 1526 10/24/22 2053 10/24/22 2055 10/25/22 0448 10/25/22 1935 10/26/22 0358  WBC 6.4  --   --  5.7 7.6 8.6 9.4  HGB 9.1*   < > 8.8* 9.7* 8.7* 9.7* 9.4*  HCT 27.2*   < > 26.0* 28.1* 25.6* 29.1* 27.9*  MCV 85.5  --   --  83.6 84.8 85.6 85.3  PLT 122*  --   --  104* 125* 115* 115*   < > = values in this interval not displayed.    Cardiac Enzymes: No results for input(s): "CKTOTAL", "CKMB", "CKMBINDEX", "TROPONINI" in the last 168 hours.  BNP: BNP (last 3 results) Recent Labs    05/07/22 1853 07/17/22 1458 10/14/22 1130  BNP 2,597.3* 2,184.7* 1,220.2*    ProBNP (last 3 results) Recent Labs    12/19/21 1107 10/01/22 1220 10/10/22 0923  PROBNP 2,342.0* 13,775* 9,776*      Other results:  Imaging: DG Chest Port 1 View  Result Date: 10/25/2022 CLINICAL DATA:  Post MVR EXAM: PORTABLE CHEST 1 VIEW COMPARISON:  Portable exam 0532 hours compared to 10/24/2022 FINDINGS: Interval removal of endotracheal and nasogastric tubes. RIGHT jugular Swan-Ganz catheter with tip projecting over RIGHT hilum, unchanged. Mediastinal drain unchanged. LEFT subclavian ICD with leads projecting over RIGHT atrium and RIGHT ventricle unchanged. Enlargement of cardiac silhouette post median sternotomy, MVR, and coronary stenting. Atherosclerotic calcification and tortuosity of thoracic aorta. Lungs clear. No pleural effusion or pneumothorax. Bones demineralized.  IMPRESSION: Postoperative changes of MVR. No acute abnormalities. Aortic Atherosclerosis (ICD10-I70.0). Electronically Signed   By: Lavonia Dana M.D.   On: 10/25/2022 08:35   DG Chest Port 1 View  Result Date: 10/24/2022 CLINICAL DATA:  Status post mitral valve replacement EXAM: PORTABLE CHEST 1 VIEW COMPARISON:  Chest x-ray dated October 14, 2022 FINDINGS: ETT tip is proximally 2 cm from the carina. Gastric decompression tube partially seen coursing below the diaphragm. Mediastinal drain. Right IJ approach PA catheter with tip overlying the expected area of the right pulmonary artery. Left AICD with unchanged lead position. Cardiac and mediastinal contours are within normal limits status post median sternotomy. Lungs are clear. No pleural effusion or pneumothorax. IMPRESSION: 1. Support apparatus as above. 2. No acute cardiopulmonary process. Electronically Signed   By: Yetta Glassman M.D.   On: 10/24/2022 16:04     Medications:     Scheduled Medications:  acetaminophen  1,000 mg Oral Q6H   Or   acetaminophen (TYLENOL) oral liquid 160 mg/5 mL  1,000 mg Per Tube Q6H   amiodarone  400 mg Oral BID   aspirin EC  81 mg Oral Daily   atorvastatin  80 mg Oral Daily   bisacodyl  10 mg Oral Daily   Or   bisacodyl  10 mg Rectal Daily   bumetanide (BUMEX) IV  2 mg Intravenous Once   carvedilol  12.5 mg Oral BID WC   Chlorhexidine Gluconate Cloth  6 each Topical Daily   docusate sodium  200 mg Oral Daily   dorzolamide-timolol  1 drop Both Eyes BID   furosemide  40 mg Intravenous Q12H   irbesartan  37.5 mg Oral Daily   latanoprost  1 drop Both Eyes QHS   pantoprazole  40 mg Oral Daily   potassium chloride  20 mEq Oral BID   sodium chloride flush  10-40 mL Intracatheter Q12H   sodium chloride flush  3 mL Intravenous Q12H   traZODone  150 mg Oral QHS   venlafaxine XR  300 mg Oral Q breakfast   warfarin  2 mg Oral ONCE-1600   Warfarin - Physician Dosing Inpatient   Does not apply q1600     Infusions:  sodium chloride Stopped (10/25/22 0807)   sodium chloride     sodium chloride 10 mL/hr at 10/25/22 2300    ceFAZolin (ANCEF) IV Stopped (10/25/22 2251)   lactated ringers      PRN Medications: sodium chloride, dextrose,  metoprolol tartrate, morphine injection, mouth rinse, mouth rinse, oxyCODONE, prochlorperazine, sodium chloride flush, sodium chloride flush, traMADol   Assessment/Plan:    1. Mitral regurgitation, severe - s/p mTEER with 2 clips on 10/04/22 - Post op echo EF 25% with moderate to severe MR felt secondary to SLDA of the second MitraClip placement. - S/p MVR with 29 mm Mosaic valve by Dr. Lavonna Monarch. Returned to OR  for control of bleeding.  - Off milrinone. Co-ox 78%  - Post-op care per TCTS. Ambulate. Encouraged IS.   2. Chronic systolic HF - initially felt due to ischemic CM. S/p Anterior STEMI 5/17. Now suspect MR major contributing factor. - Echo (8/19): EF 30-35%. - cMRI 1/23 EF 15% No LGE. RV ok. Severe LV dyssynchrony Moderate MR  - CRT-D placed 01/02/22 with LBBB lead by Dr. Quentin Ore - Echo 07/17/22 EF 20-25% G2DD, RV ok. Severe central MR  - s/p mTEER with 2 clips on 10/04/22 - Echo 10/05/22: EF 25% with moderate to severe MR felt secondary to SLDA of the second MitraClip placement.  - RHC 12/23: RA 4, PA 47/20 (33) PCWP 13 CO 5.2 CI 3.1  PVR 2.1  - S/p MVR 10/24/22 - Off milrinone. Co-ox 78%  - Weight up 8 pounds post-op. Give lasix '40mg'$  IV x bid today - Add back Avapro for BP   3. Coronary artery disease involving native coronary artery of native heart without angina pectoris - Hx of anterior STEMI in 5/17 tx with DES x 2 to the LAD.   - No LGE on cMRI (1/23) - LHC 9/23 patent LAD stents - On aspirin + statin - No angina  4. LBBB - s/p CRT-D 3/23 with Dr. Quentin Ore  - EF has not improved   5. Post-op ABLA - Returned to OR or control of bleeding as above - Hgb stable  Advanced Bowel regimen. Ambulate today. CT surgery planning to  pull CTs and transfer to stepdown     Length of Stay: 2   Brittainy Ladoris Gene  10/26/2022, 8:01 AM  Advanced Heart Failure Team Pager [phone] (M-F; 7a - 4p)  Please contact [facility] for night-coverage after hours (4p -7a ) and weekends on [email or web address]  Patient seen and examined with the above-signed Advanced Practice Provider and/or Housestaff. I personally reviewed laboratory data, imaging studies and relevant notes. I independently examined the patient and formulated the important aspects of the plan. I have edited the note to reflect any of my changes or salient points. I have personally discussed the plan with the patient and/or family.  Feels ok. Denies SOB. Chest sore. Vitals ok. CTs dry. Volume is elevated. No BM yet  General:  Sitting up No resp difficulty HEENT: normal Neck: supple. JVP 8-9 . Carotids 2+ bilat; no bruits. No lymphadenopathy or thryomegaly appreciated. Cor: Sternal wound ok  Regular rate & rhythm. No rubs, gallops or murmurs. Lungs: clear decreased at bases Abdomen: soft, nontender, nondistended. No hepatosplenomegaly. No bruits or masses. Hypoactive bowel sounds. Extremities: no cyanosis, clubbing, rash, tr-1+ edema Neuro: alert & orientedx3, cranial nerves grossly intact. moves all 4 extremities w/o difficulty. Affect pleasant  Progressing well after MVR. Agree with diuresis. Adjust bowel regimen. Encourage IS. Ambulate.   Glori Bickers, MD  8:59 AM

## 2022-10-26 NOTE — Progress Notes (Signed)
2 Days Post-Op Procedure(s) (LRB): EXPLORATION POST OPERATIVE OPEN HEART (N/A) Subjective: No complaints. Eating well, ambulating well  Co-ox 78.2 this am  Objective: Vital signs in last 24 hours: Temp:  [97.6 F (36.4 C)-100.2 F (37.9 C)] 97.6 F (36.4 C) (12/22 0300) Pulse Rate:  [88-103] 97 (12/22 0700) Cardiac Rhythm: A-V Sequential paced (12/22 0400) Resp:  [13-37] 28 (12/22 0700) BP: (82-164)/(38-111) 140/111 (12/22 0700) SpO2:  [94 %-99 %] 99 % (12/22 0700) Arterial Line BP: (194-206)/(52-58) 194/52 (12/21 0800) Weight:  [62.5 kg] 62.5 kg (12/22 0500)  Hemodynamic parameters for last 24 hours: PAP: (22-23)/(19) 22/19 CVP:  [1 mmHg-5 mmHg] 1 mmHg  Intake/Output from previous day: 12/21 0701 - 12/22 0700 In: 1059.8 [P.O.:480; I.V.:379.8; IV Piggyback:200] Out: 1960 [Urine:1660; Chest Tube:300] Intake/Output this shift: No intake/output data recorded.  General appearance: alert and cooperative Neurologic: intact Heart: regular rate and rhythm, S1, S2 normal, no murmur Lungs: clear to auscultation bilaterally Extremities: edema mild Wound: dressing dry  Lab Results: Recent Labs    10/25/22 1935 10/26/22 0358  WBC 8.6 9.4  HGB 9.7* 9.4*  HCT 29.1* 27.9*  PLT 115* 115*   BMET:  Recent Labs    10/25/22 1935 10/26/22 0358  NA 139 138  K 4.0 3.9  CL 109 108  CO2 23 24  GLUCOSE 154* 112*  BUN 20 19  CREATININE 1.11* 0.95  CALCIUM 8.8* 8.9    PT/INR:  Recent Labs    10/24/22 1525  LABPROT 23.1*  INR 2.1*   ABG    Component Value Date/Time   PHART 7.316 (L) 10/24/2022 2053   HCO3 20.7 10/24/2022 2053   TCO2 22 10/24/2022 2053   ACIDBASEDEF 5.0 (H) 10/24/2022 2053   O2SAT 78.2 10/26/2022 0358   CBG (last 3)  Recent Labs    10/25/22 2016 10/25/22 2346 10/26/22 0344  GLUCAP 145* 143* 110*   CXR: ok  Assessment/Plan:  POD 2 MVR with porcine valve  Hemodynamically stable in AV paced rhythm via PPM. Co-ox normal. She is hypertensive  so will resume ARB at half dose today. Renal function normal.  Volume excess: -900 cc yesterday. Wt is 8 lbs over preop. Continue diuresis and KCL today.  Glucose under good control and no DM so will DC CBG's  INR pending this am. Continue Coumadin. 2 mg ordered this am per PWW.  Chest tube output low and thin. Will remove the tubes.  Transfer to 4E and continue IS, ambulation.     LOS: 2 days    Gaye Pollack 10/26/2022

## 2022-10-26 NOTE — Progress Notes (Signed)
NAME:  Melanie Ray, MRN:  [id number], DOB:  12/27/1950, LOS: 2 ADMISSION DATE:  10/24/2022, CONSULTATION DATE:  10/24/22 REFERRING MD:  Lavonna Monarch, CHIEF COMPLAINT:  s/p MVR   History of Present Illness:  71 yo PMH MR s/p mitraclip x2, HFrEF, HTN, HLD, CAD s/p PCI/DES, LBBB s/p CRT-D presented 10/24/22 for planned MVR. Total pump time 90 min Xclamp 66 min. EBL 1311m, received  6619mcell saver.  Returned to ICU intubated sedated post operative as per plan. Post op course c/b hypotension, high output from chest tube. Patient taken for re-exploration and was found to have bleeding pericardial vessel, which was cauterized with good hemostasis. EBL 34070mReceived 1 PRBC 1 cryo 1 plt 170m9mll saver   Returns to ICU intubated sedated per plan.   PCCM consulted for post op care   Pertinent  Medical History  Severe MR HFrEF/NICM CAD HTN HLD LBBB  S/p CRT-D   Significant Hospital Events: Including procedures, antibiotic start and stop dates in addition to other pertinent events   12/20 MVR. Re-exploration for mediastinal bleeding. Pericardial vessel cauterized.   Interim History / Subjective:  This morning she denies complaints. Overnight no acute events. Tmax 99.4.  Objective   Blood pressure (!) 147/63, pulse 93, temperature 97.6 F (36.4 C), temperature source Oral, resp. rate 18, height '5\' 3"'$  (1.6 m), weight 62.5 kg, SpO2 97 %. PAP: (22-23)/(16-19) 22/19 CVP:  [1 mmHg-5 mmHg] 1 mmHg      Intake/Output Summary (Last 24 hours) at 10/26/2022 0702 Last data filed at 10/26/2022 0525 Gross per 24 hour  Intake 1059.84 ml  Output 1960 ml  Net -900.16 ml    Filed Weights   10/24/22 0731 10/25/22 0500 10/26/22 0500  Weight: 59 kg 63.9 kg 62.5 kg    Examination: General: elderly woman sitting up in bed in NAD HENT: [place]/AT, eyes anicteric Lungs: breathing comfortably on , CTAB. Cardiovascular: S1S2, paced rhythm, reg rate & rhythm Abdomen: soft, NT Extremities: no  significant edema or cyanosis Neuro: awake, alert, moving extremities Derm: warm, dry, no rashes. Sternal incision covered .  Co. ox 78% BUN 19 Creatinine 0.95 WBC 9.4 H/H  9.4/27.9 Platelets 115 CXR personally reviewed- no infiltrates or effusions    Resolved Hospital Problem list     Assessment & Plan:   Severe MR with prior mitraclip x2, now s/p MVR Mediastinal bleeding s/p re-exploration and vessel cautery  Expected post bypass vasoplegia / hypotension  --postop care per TCTS- chest tubes out today - Increase Coreg. Resume spironolactone. - AICD has been turned back on postoperatively. -progress mobility -pain control per protocol- oxy, tramadol, morphine PRN -aspirin, statin -start low dose warfarin; dosing per TCTS -bumex today  Expected post op ABLA  Chronic anemia -transfuse for Hb <7 or hemodynamically significant bleeding -monitor  CAD, HFrEF due to ICM HTN HLD LBBB s/p CRT-D -ASA, statin, coreg - AICD turned back on last evening. -Resume warfarin, let INR drift up. -Aprpeciate AHF's management -increase coreg, starting spironolactone today - Can resume Imdur, valsartan as blood pressure tolerates in the coming days  Hypokalemia, resolved -monitor with diuresis  Anxiety ?OCD -Resume Effexor and trazodone today - Has to bring Caplyta from home  Discussed during multidisciplinary rounds. Transferring to progressive care today.  Best Practice (right click and "Reselect all SmartList Selections" daily)   Diet/type: Regular consistency (see orders) DVT prophylaxis: LMWH GI prophylaxis: PPI Lines: Central line Foley:  N/A Code Status:  full code Last date of multidisciplinary goals of  care discussion [--]  Labs   CBC: Recent Labs  Lab 10/24/22 1525 10/24/22 1526 10/24/22 2053 10/24/22 2055 10/25/22 0448 10/25/22 1935 10/26/22 0358  WBC 6.4  --   --  5.7 7.6 8.6 9.4  HGB 9.1*   < > 8.8* 9.7* 8.7* 9.7* 9.4*  HCT 27.2*   < > 26.0* 28.1*  25.6* 29.1* 27.9*  MCV 85.5  --   --  83.6 84.8 85.6 85.3  PLT 122*  --   --  104* 125* 115* 115*   < > = values in this interval not displayed.     Basic Metabolic Panel: Recent Labs  Lab 10/24/22 0721 10/24/22 0918 10/24/22 1201 10/24/22 1204 10/24/22 2053 10/24/22 2055 10/25/22 0448 10/25/22 1935 10/26/22 0358  NA 143   < > 141   < > 144 142 138 139 138  K 3.9   < > 4.6   < > 4.9 4.7 4.0 4.0 3.9  CL 114*   < > 106  --   --  116* 109 109 108  CO2 23  --   --   --   --  21* 21* 23 24  GLUCOSE 118*   < > 174*  --   --  132* 143* 154* 112*  BUN 26*   < > 19  --   --  '17 15 20 19  '$ CREATININE 1.29*   < > 0.70  --   --  0.83 0.87 1.11* 0.95  CALCIUM 9.8  --   --   --   --  7.7* 8.5* 8.8* 8.9  MG  --   --   --   --   --  3.7* 2.6* 2.3  --    < > = values in this interval not displayed.    GFR: Estimated Creatinine Clearance: 44.3 mL/min (by C-G formula based on SCr of 0.95 mg/dL). Recent Labs  Lab 10/24/22 2055 10/25/22 0448 10/25/22 1935 10/26/22 0358  WBC 5.7 7.6 8.6 9.4     Liver Function Tests: Recent Labs  Lab 10/24/22 0721  AST 28  ALT 25  ALKPHOS 105  BILITOT 0.8  PROT 6.7  ALBUMIN 3.7    No results for input(s): "LIPASE", "AMYLASE" in the last 168 hours. No results for input(s): "AMMONIA" in the last 168 hours.    Julian Hy, DO 10/26/22 10:43 AM [facility]  For contact information, see Amion. If no response to pager, please call PCCM consult pager. After hours, 7PM- 7AM, please call Elink.

## 2022-10-26 NOTE — Progress Notes (Signed)
Paged PA Donielle concerning multiple watery bowel movements. Patient upset she was given so many laxatives at once. Per report patient had history of chronic constipation. Patient states that she is regular and requested imodium. Order placed per PA and given to patient. Patient has bedside commode and assisted multiple times. Pt resting with call bell within reach.  Will continue to monitor.

## 2022-10-27 ENCOUNTER — Inpatient Hospital Stay (HOSPITAL_COMMUNITY): Payer: Medicare Other

## 2022-10-27 DIAGNOSIS — I5022 Chronic systolic (congestive) heart failure: Secondary | ICD-10-CM | POA: Diagnosis not present

## 2022-10-27 DIAGNOSIS — I5020 Unspecified systolic (congestive) heart failure: Secondary | ICD-10-CM

## 2022-10-27 DIAGNOSIS — Z952 Presence of prosthetic heart valve: Secondary | ICD-10-CM | POA: Diagnosis not present

## 2022-10-27 LAB — PROTIME-INR
INR: 1.4 — ABNORMAL HIGH (ref 0.8–1.2)
Prothrombin Time: 17.2 seconds — ABNORMAL HIGH (ref 11.4–15.2)

## 2022-10-27 LAB — BASIC METABOLIC PANEL
Anion gap: 6 (ref 5–15)
BUN: 21 mg/dL (ref 8–23)
CO2: 23 mmol/L (ref 22–32)
Calcium: 8.5 mg/dL — ABNORMAL LOW (ref 8.9–10.3)
Chloride: 109 mmol/L (ref 98–111)
Creatinine, Ser: 0.86 mg/dL (ref 0.44–1.00)
GFR, Estimated: >60 mL/min (ref >60)
Glucose, Bld: 127 mg/dL — ABNORMAL HIGH (ref 70–99)
Potassium: 3.8 mmol/L (ref 3.5–5.1)
Sodium: 138 mmol/L (ref 135–145)

## 2022-10-27 LAB — CBC
HCT: 28.6 % — ABNORMAL LOW (ref 36.0–46.0)
Hemoglobin: 9.3 g/dL — ABNORMAL LOW (ref 12.0–15.0)
MCH: 28 pg (ref 26.0–34.0)
MCHC: 32.5 g/dL (ref 30.0–36.0)
MCV: 86.1 fL (ref 80.0–100.0)
Platelets: 116 10*3/uL — ABNORMAL LOW (ref 150–400)
RBC: 3.32 MIL/uL — ABNORMAL LOW (ref 3.87–5.11)
RDW: 15.6 % — ABNORMAL HIGH (ref 11.5–15.5)
WBC: 7.2 10*3/uL (ref 4.0–10.5)
nRBC: 0 % (ref 0.0–0.2)

## 2022-10-27 MED ORDER — POTASSIUM CHLORIDE CRYS ER 20 MEQ PO TBCR
30.0000 meq | EXTENDED_RELEASE_TABLET | Freq: Two times a day (BID) | ORAL | Status: AC
  Administered 2022-10-27 (×2): 30 meq via ORAL
  Filled 2022-10-27 (×2): qty 1

## 2022-10-27 MED ORDER — FUROSEMIDE 40 MG PO TABS
40.0000 mg | ORAL_TABLET | Freq: Every day | ORAL | Status: DC
  Administered 2022-10-27 – 2022-10-30 (×4): 40 mg via ORAL
  Filled 2022-10-27 (×4): qty 1

## 2022-10-27 MED ORDER — SPIRONOLACTONE 25 MG PO TABS
25.0000 mg | ORAL_TABLET | Freq: Every day | ORAL | Status: DC
  Administered 2022-10-27 – 2022-10-30 (×4): 25 mg via ORAL
  Filled 2022-10-27 (×4): qty 1

## 2022-10-27 MED ORDER — WARFARIN SODIUM 2 MG PO TABS
3.0000 mg | ORAL_TABLET | Freq: Every day | ORAL | Status: DC
  Administered 2022-10-27: 3 mg via ORAL
  Filled 2022-10-27: qty 1

## 2022-10-27 MED ORDER — EMPAGLIFLOZIN 10 MG PO TABS
10.0000 mg | ORAL_TABLET | Freq: Every day | ORAL | Status: DC
  Administered 2022-10-27 – 2022-10-30 (×4): 10 mg via ORAL
  Filled 2022-10-27 (×4): qty 1

## 2022-10-27 MED ORDER — POTASSIUM CHLORIDE CRYS ER 20 MEQ PO TBCR
20.0000 meq | EXTENDED_RELEASE_TABLET | Freq: Two times a day (BID) | ORAL | Status: DC
  Administered 2022-10-28 – 2022-10-30 (×5): 20 meq via ORAL
  Filled 2022-10-27 (×5): qty 1

## 2022-10-27 NOTE — Evaluation (Signed)
Physical Therapy Evaluation Patient Details Name: Melanie Ray MRN: [id number] DOB: [birth year] Today's Date: 10/27/2022  History of Present Illness  Pt is a 71 y.o. female s/p recent MVR (10/04/2022), now admitted 10/24/22 for same day MVR replacement. Pt with mediastinal bleeding post-op requiring return to OR 12/20 for re-exploration and redo sternotomy; extubated same day. PMH includes CAD, AICD, CHF, MI, HTN, pre-diabetes, stroke, OSA, anxiety, depression, OCD.   Clinical Impression  Pt presents with an overall decrease in functional mobility secondary to above. PTA, pt independent withotu DME, active, assists husband at home who has dementia. Today, pt able to transfer, ambulate and perform stair training with intermittent min guard for balance; pt demonstrates good awareness of sternal precautions with minimal cues. Pt reports sister to stay with her at d/c to provide increased support. Pt would benefit from continued acute PT services to maximize functional mobility and independence prior to d/c with HHPT services.      Recommendations for follow up therapy are one component of a multi-disciplinary discharge planning process, led by the attending physician.  Recommendations may be updated based on patient status, additional functional criteria and insurance authorization.  Follow Up Recommendations Home health PT      Assistance Recommended at Discharge Intermittent Supervision/Assistance  Patient can return home with the following  A little help with bathing/dressing/bathroom;Assistance with cooking/housework;Assist for transportation;Help with stairs or ramp for entrance    Equipment Recommendations Other (comment) (shower seat)  Recommendations for Other Services   Mobility Specialist   Functional Status Assessment Patient has had a recent decline in their functional status and demonstrates the ability to make significant improvements in function in a reasonable and  predictable amount of time.     Precautions / Restrictions Precautions Precautions: Fall;Sternal      Mobility  Bed Mobility Overal bed mobility: Needs Assistance Bed Mobility: Rolling, Sidelying to Sit, Sit to Sidelying Rolling: Supervision Sidelying to sit: Supervision     Sit to sidelying: Supervision General bed mobility comments: pt attempting to power up to sitting while holding heart pillow; educ on log roll technique, able to perform with supervision for cues/sequencing    Transfers Overall transfer level: Needs assistance Equipment used: None Transfers: Sit to/from Stand Sit to Stand: Supervision           General transfer comment: good awareness of hand placement without cues    Ambulation/Gait Ambulation/Gait assistance: Supervision, Min guard Gait Distance (Feet): 340 Feet Assistive device: None Gait Pattern/deviations: Step-through pattern, Decreased stride length Gait velocity: Decreased     General Gait Details: slow, mostly steady gait with intermittent min guard for balance; pt endorses BLE fatigue and feeling weak  Stairs Stairs: Yes Stairs assistance: Min guard Stair Management: One rail Left, Step to pattern, Forwards Number of Stairs: 11 General stair comments: encouraged step-to pattern for energy conservation and stability; pt able to ascend/descend 11 steps with single UE rail support, min guard for balance; standing rest break at top of stairs before descending back down  Wheelchair Mobility    Modified Rankin (Stroke Patients Only)       Balance Overall balance assessment: Needs assistance Sitting-balance support: No upper extremity supported, Feet supported Sitting balance-Leahy Scale: Good     Standing balance support: No upper extremity supported, During functional activity Standing balance-Leahy Scale: Fair                               Pertinent  Vitals/Pain Pain Assessment Pain Assessment: Faces Faces  Pain Scale: Hurts a little bit Pain Location: sternal incision Pain Descriptors / Indicators: Discomfort Pain Intervention(s): Monitored during session    Home Living Family/patient expects to be discharged to:: Private residence Living Arrangements: Spouse/significant other Available Help at Discharge: Family Type of Home: House Home Access: Stairs to enter Entrance Stairs-Rails: Can reach both Entrance Stairs-Number of Steps: 4 Alternate Level Stairs-Number of Steps: flight Home Layout: Two level;Bed/bath upstairs Home Equipment: None Additional Comments: lives with husband who has dementia (requires supervision, but indep with majority of mobility/ADLs); sister currently staying to provide pt and pt's husband with assist at d/c    Prior Function Prior Level of Function : Independent/Modified Independent;Driving             Mobility Comments: independent without DME, enjoys staying active and playing with her dog; helps care for husband ADLs Comments: indep with ADL/iADLs. has house cleaner     Hand Dominance        Extremity/Trunk Assessment   Upper Extremity Assessment Upper Extremity Assessment: Overall WFL for tasks assessed    Lower Extremity Assessment Lower Extremity Assessment: Overall WFL for tasks assessed    Cervical / Trunk Assessment Cervical / Trunk Assessment: Normal  Communication   Communication: No difficulties  Cognition Arousal/Alertness: Awake/alert Behavior During Therapy: WFL for tasks assessed/performed Overall Cognitive Status: Within Functional Limits for tasks assessed                                          General Comments      Exercises     Assessment/Plan    PT Assessment Patient needs continued PT services  PT Problem List Decreased strength;Decreased activity tolerance;Decreased balance;Decreased mobility;Decreased knowledge of precautions;Cardiopulmonary status limiting activity       PT Treatment  Interventions DME instruction;Gait training;Stair training;Functional mobility training;Therapeutic activities;Therapeutic exercise;Balance training;Patient/family education    PT Goals (Current goals can be found in the Care Plan section)  Acute Rehab PT Goals Patient Stated Goal: return home, regain strength PT Goal Formulation: With patient Time For Goal Achievement: 11/10/22 Potential to Achieve Goals: Good    Frequency Min 3X/week     Co-evaluation               AM-PAC PT "6 Clicks" Mobility  Outcome Measure Help needed turning from your back to your side while in a flat bed without using bedrails?: A Little Help needed moving from lying on your back to sitting on the side of a flat bed without using bedrails?: A Little Help needed moving to and from a bed to a chair (including a wheelchair)?: A Little Help needed standing up from a chair using your arms (e.g., wheelchair or bedside chair)?: A Little Help needed to walk in hospital room?: A Little Help needed climbing 3-5 steps with a railing? : A Little 6 Click Score: 18    End of Session Equipment Utilized During Treatment: Gait belt Activity Tolerance: Patient tolerated treatment well Patient left: in bed;with call bell/phone within reach Nurse Communication: Mobility status PT Visit Diagnosis: Other abnormalities of gait and mobility (R26.89);Muscle weakness (generalized) (M62.81)    Time: 1245-8099 PT Time Calculation (min) (ACUTE ONLY): 21 min   Charges:   PT Evaluation $PT Eval Moderate Complexity: 1 Mod         Mabeline Caras, PT, DPT Acute Rehabilitation Services  Personal: Secure Chat Rehab Office: [place] 10/27/2022, 11:12 AM

## 2022-10-27 NOTE — Progress Notes (Signed)
Cardiology Progress Note  Patient ID: Melanie Ray MRN: [id number] DOB: [birth year] Date of Encounter: 10/27/2022  Primary Cardiologist: Sherren Mocha, MD  Subjective   Chief Complaint: None.   HPI: Euvolemic on exam. Doing well.   ROS:  All other ROS reviewed and negative. Pertinent positives noted in the HPI.     Inpatient Medications  Scheduled Meds:  aspirin EC  81 mg Oral Daily   atorvastatin  80 mg Oral Daily   carvedilol  12.5 mg Oral BID WC   [facility], Patient & Family Education   Does not apply Once   dorzolamide-timolol  1 drop Both Eyes BID   furosemide  40 mg Oral Daily   irbesartan  37.5 mg Oral Daily   latanoprost  1 drop Both Eyes QHS   pantoprazole  40 mg Oral Daily   potassium chloride  30 mEq Oral BID   [START ON 10/28/2022] potassium chloride  20 mEq Oral BID   sodium chloride flush  3 mL Intravenous Q12H   spironolactone  25 mg Oral Daily   traZODone  150 mg Oral QHS   venlafaxine XR  300 mg Oral Q breakfast   warfarin  2.5 mg Oral q1600   Warfarin - Physician Dosing Inpatient   Does not apply q1600   Continuous Infusions:  sodium chloride     PRN Meds: sodium chloride, loperamide, mouth rinse, oxyCODONE, prochlorperazine, sodium chloride flush, traMADol   Vital Signs   Vitals:   10/26/22 1953 10/27/22 0135 10/27/22 0427 10/27/22 0500  BP: (!) 109/53 (!) 115/44 136/66   Pulse: 87 85 82   Resp: '12 17 20   '$ Temp: 98.6 F (37 C) (!) 97.4 F (36.3 C) 98.1 F (36.7 C)   TempSrc: Oral Oral Oral   SpO2: 98% 97% 98%   Weight:    60.6 kg  Height:        Intake/Output Summary (Last 24 hours) at 10/27/2022 1059 Last data filed at 10/27/2022 0139 Gross per 24 hour  Intake 0 ml  Output 650 ml  Net -650 ml      10/27/2022    5:00 AM 10/26/2022    5:00 AM 10/25/2022    5:00 AM  Last 3 Weights  Weight (lbs) 133 lb 9.6 oz 137 lb 12.6 oz 140 lb 14 oz  Weight (kg) 60.6 kg 62.5 kg 63.9 kg      Telemetry   Overnight telemetry shows AV paced rhythm 90s, which I personally reviewed.    Physical Exam   Vitals:   10/26/22 1953 10/27/22 0135 10/27/22 0427 10/27/22 0500  BP: (!) 109/53 (!) 115/44 136/66   Pulse: 87 85 82   Resp: '12 17 20   '$ Temp: 98.6 F (37 C) (!) 97.4 F (36.3 C) 98.1 F (36.7 C)   TempSrc: Oral Oral Oral   SpO2: 98% 97% 98%   Weight:    60.6 kg  Height:        Intake/Output Summary (Last 24 hours) at 10/27/2022 1059 Last data filed at 10/27/2022 0139 Gross per 24 hour  Intake 0 ml  Output 650 ml  Net -650 ml       10/27/2022    5:00 AM 10/26/2022    5:00 AM 10/25/2022    5:00 AM  Last 3 Weights  Weight (lbs) 133 lb 9.6 oz 137 lb 12.6 oz 140 lb 14 oz  Weight (kg) 60.6 kg 62.5 kg 63.9 kg    Body mass index is  23.67 kg/m.  General: Well nourished, well developed, in no acute distress Head: Atraumatic, normal size  Eyes: PEERLA, EOMI  Neck: Supple, JVD 5-7 cmH2O Endocrine: No thryomegaly Cardiac: Normal S1, S2; RRR; no murmurs, rubs, or gallops Lungs: Diminished breath sounds bilaterally Abd: Soft, nontender, no hepatomegaly  Ext: No edema, pulses 2+ Musculoskeletal: No deformities, BUE and BLE strength normal and equal Skin: Warm and dry, no rashes   Neuro: Alert and oriented to person, place, time, and situation, CNII-XII grossly intact, no focal deficits  Psych: Normal mood and affect   Labs  High Sensitivity Troponin:   Recent Labs  Lab 10/14/22 1025 10/14/22 1330  TROPONINIHS 80* 92*     Cardiac EnzymesNo results for input(s): "TROPONINI" in the last 168 hours. No results for input(s): "TROPIPOC" in the last 168 hours.  Chemistry Recent Labs  Lab 10/24/22 0721 10/24/22 0918 10/25/22 1935 10/26/22 0358 10/27/22 0111  NA 143   < > 139 138 138  K 3.9   < > 4.0 3.9 3.8  CL 114*   < > 109 108 109  CO2 23   < > '23 24 23  '$ GLUCOSE 118*   < > 154* 112* 127*  BUN 26*   < > '20 19 21  '$ CREATININE 1.29*   < > 1.11* 0.95 0.86  CALCIUM 9.8   < >  8.8* 8.9 8.5*  PROT 6.7  --   --   --   --   ALBUMIN 3.7  --   --   --   --   AST 28  --   --   --   --   ALT 25  --   --   --   --   ALKPHOS 105  --   --   --   --   BILITOT 0.8  --   --   --   --   GFRNONAA 44*   < > 53* >60 >60  ANIONGAP 6   < > '7 6 6   '$ < > = values in this interval not displayed.    Hematology Recent Labs  Lab 10/25/22 1935 10/26/22 0358 10/27/22 0111  WBC 8.6 9.4 7.2  RBC 3.40* 3.27* 3.32*  HGB 9.7* 9.4* 9.3*  HCT 29.1* 27.9* 28.6*  MCV 85.6 85.3 86.1  MCH 28.5 28.7 28.0  MCHC 33.3 33.7 32.5  RDW 16.2* 16.0* 15.6*  PLT 115* 115* 116*   BNPNo results for input(s): "BNP", "PROBNP" in the last 168 hours.  DDimer No results for input(s): "DDIMER" in the last 168 hours.   Radiology  DG Chest 2 View  Result Date: 10/27/2022 CLINICAL DATA:  [id number] S/P mitral valve replacement [id number] EXAM: CHEST - 2 VIEW COMPARISON:  10/26/2022 FINDINGS: Cardiac silhouette enlarged. MITRAL VALVE PROSTHESIS. MEDIAN STERNOTOMY WIRES. CALCIFIED AORTA. No evidence of pneumothorax or pleural effusion. No evidence of pulmonary edema. LEFT-SIDED PACER IN PLACE. IMPRESSION: Enlarged cardiac silhouette.  Postop changes. Electronically Signed   By: Sammie Bench M.D.   On: 10/27/2022 10:54   DG Chest Port 1 View  Result Date: 10/26/2022 CLINICAL DATA:  Status post mitral valve repair. EXAM: PORTABLE CHEST 1 VIEW COMPARISON:  10/25/2022 FINDINGS: Interval removal of Swan-Ganz catheter. Left AICD remains in place, unchanged. Cardiomegaly, aortic atherosclerosis. Linear density at the left base, favor atelectasis. Right lung clear. No effusions. Questionable tiny right apical pneumothorax. IMPRESSION: Questionable tiny right apical pneumothorax. Cardiomegaly. Left base atelectasis. These results will be called to the ordering clinician  or representative by the Radiologist Assistant, and communication documented in the PACS or [employer]. Electronically Signed   By: Rolm Baptise M.D.    On: 10/26/2022 12:26    Cardiac Studies  TTE 10/05/2022  1. Left ventricular ejection fraction, by estimation, is 25%. The left  ventricle has severely decreased function. The left ventricle demonstrates  global hypokinesis. The left ventricular internal cavity size was  moderately dilated. Left ventricular  diastolic parameters are consistent with Grade III diastolic dysfunction  (restrictive).   2. Right ventricular systolic function is normal. The right ventricular  size is normal. There is normal pulmonary artery systolic pressure.   3. Left atrial size was severely dilated.   4. Right atrial size was mildly dilated.   5. A small pericardial effusion is present.   6. The mitral valve has been repaired/replaced. Moderate to severe mitral  valve regurgitation. This likely represted SLDA of the second Mitra Clip  from 10/04/22. The mean mitral valve gradient is 9.0 mmHg with average  heart rate of 83 bpm.   7. Tricuspid valve regurgitation is mild to moderate.   8. The aortic valve was not well visualized. Aortic valve regurgitation  is mild. Aortic valve sclerosis is present, with no evidence of aortic  valve stenosis.   9. The inferior vena cava is normal in size with greater than 50%  respiratory variability, suggesting right atrial pressure of 3 mmHg.  10. Evidence of atrial level shunting detected by color flow Doppler.   Patient Profile  Melanie Ray is a 71 y.o. female with systolic heart failure (EF 25%), CAD status post PCI, left bundle branch block status post CRT-D, hypertension who was admitted on 10/24/2022 for mitral valve replacement.  Cardiology consulted for systolic heart failure management.  Assessment & Plan   # Severe mitral valve regurgitation status post failed transcatheter edge-to-edge repair # Status post 29 mm Mosaic mitral valve prosthesis with Dr. Reinaldo Berber on 10/24/2022 -Doing well postoperatively.  Did have a course complicated by pericardial  bleeding which had to be managed in the OR. -Recovering well. -Off milrinone. -Continue Coumadin per CT surgery  # Acute on chronic systolic heart failure, EF 25% # Left bundle branch block status post CRT-D -Euvolemic on examination.  Transition to 40 mg of p.o. Lasix daily. -Continue Coreg 12.5 mg twice daily. -Continue irbesartan 37.5 mg daily.  Intolerant of Entresto. -Add Aldactone 25 mg daily. -Add [name] 10 mg daily. -Appears well.  Stable for discharge per CT surgery.  #CAD -No symptoms of angina.  Continue aspirin and statin therapy.    For questions or updates, please contact [place] Please consult [email or web address] for contact info under     Signed, [place] T. Audie Box, MD, [place]  10/27/2022 10:59 AM

## 2022-10-27 NOTE — Progress Notes (Signed)
CARDIAC REHAB PHASE I    Pt feeling well today, washed up and walked with PT this morning. Tolerating activity well with no sob and minimal pain. Post OHS education including site care, restrictions, risk factors, IS use at home, heart healthy diabetic diet, home needs at discharge, sternal precautions and CRP2 reviewed. All questions and concerns addressed. Will; refer to [facility] for CRP2. Will continue to follow.   [id number]  Vanessa Barbara, RN BSN 10/27/2022 10:08 AM

## 2022-10-27 NOTE — Progress Notes (Addendum)
      [address]       [place] [address]             [phone]        3 Days Post-Op Procedure(s) (LRB): EXPLORATION POST OPERATIVE OPEN HEART (N/A)  Subjective: Patient with a lot of loose stools yesterday;she was given 4 laxatives.  Objective: Vital signs in last 24 hours: Temp:  [97.4 F (36.3 C)-99.4 F (37.4 C)] 98.1 F (36.7 C) (12/23 0427) Pulse Rate:  [82-93] 82 (12/23 0427) Cardiac Rhythm: A-V Sequential paced (12/22 2100) Resp:  [12-26] 20 (12/23 0427) BP: (109-136)/(44-66) 136/66 (12/23 0427) SpO2:  [95 %-99 %] 98 % (12/23 0427) Weight:  [60.6 kg] 60.6 kg (12/23 0500)  Pre op weight  59 kg Current Weight  10/27/22 60.6 kg       Intake/Output from previous day: 12/22 0701 - 12/23 0700 In: 160 [P.O.:160] Out: 650 [Urine:650]   Physical Exam:  Cardiovascular: AV paced Pulmonary: Clear to auscultation bilaterally; no rales, wheezes, or rhonchi. Abdomen: Soft, non tender, bowel sounds present. Extremities:Trace lower extremity edema. Wound: Aquacel removed and wound is clean and dry.  No erythema or signs of infection.  Lab Results: CBC: Recent Labs    10/26/22 0358 10/27/22 0111  WBC 9.4 7.2  HGB 9.4* 9.3*  HCT 27.9* 28.6*  PLT 115* 116*   BMET:  Recent Labs    10/26/22 0358 10/27/22 0111  NA 138 138  K 3.9 3.8  CL 108 109  CO2 24 23  GLUCOSE 112* 127*  BUN 19 21  CREATININE 0.95 0.86  CALCIUM 8.9 8.5*    PT/INR:  Lab Results  Component Value Date   INR 1.4 (H) 10/27/2022   INR 1.5 (H) 10/26/2022   INR 2.1 (H) 10/24/2022   ABG:  INR: Will add last result for INR, ABG once components are confirmed Will add last 4 CBG results once components are confirmed  Assessment/Plan:  1. CV - AV paced. On Carvedilol 12.5 mg bid, Irbesartan 37.5 mg daily, and Coumadin. INR this am slightly decreased from 1.5 to 1.4. Will give Coumadin 3 mg this evening.  2.  Pulmonary - On room air. Encourage incentive  spirometer. 3. Chronic systolic heart failure-on Lasix 40 mg IV bid. Advanced heart failure following.  4.  Expected post op acute blood loss anemia - H and H this am 9.3 and 28.6 5. Thrombocytopenia-platelets this am 116,000. 6. Supplement potassium 7. GI-stop stool softeners, laxatives. Imodium PRN  Jaivyn Gulla M ZimmermanPA-C 8:34 AM

## 2022-10-27 NOTE — Progress Notes (Signed)
Patient arrived from [facility]. CHG bath, VSS, oriented to unit and CCMD called and telemetry verified. Pt resting with call bell within reach.  Will continue to monitor.

## 2022-10-27 NOTE — Plan of Care (Signed)
  Problem: Clinical Measurements: Goal: Postoperative complications will be avoided or minimized Outcome: Progressing   Problem: Respiratory: Goal: Respiratory status will improve Outcome: Progressing   Problem: Clinical Measurements: Goal: Will remain free from infection Outcome: Progressing Goal: Diagnostic test results will improve Outcome: Progressing

## 2022-10-28 ENCOUNTER — Inpatient Hospital Stay (HOSPITAL_COMMUNITY): Payer: Medicare Other

## 2022-10-28 DIAGNOSIS — Z952 Presence of prosthetic heart valve: Secondary | ICD-10-CM | POA: Diagnosis not present

## 2022-10-28 DIAGNOSIS — I5022 Chronic systolic (congestive) heart failure: Secondary | ICD-10-CM | POA: Diagnosis not present

## 2022-10-28 LAB — TYPE AND SCREEN
ABO/RH(D): O POS
Antibody Screen: NEGATIVE
Unit division: 0
Unit division: 0
Unit division: 0
Unit division: 0
Unit division: 0
Unit division: 0
Unit division: 0
Unit division: 0

## 2022-10-28 LAB — BPAM RBC
Blood Product Expiration Date: 202312272359
Blood Product Expiration Date: 202312272359
Blood Product Expiration Date: 202312282359
Blood Product Expiration Date: 202401082359
Blood Product Expiration Date: 202401092359
Blood Product Expiration Date: 202401092359
Blood Product Expiration Date: 202401132359
Blood Product Expiration Date: 202401132359
ISSUE DATE / TIME: 202312200810
ISSUE DATE / TIME: 202312200810
ISSUE DATE / TIME: 202312201335
ISSUE DATE / TIME: 202312201335
ISSUE DATE / TIME: 202312201354
ISSUE DATE / TIME: 202312201354
ISSUE DATE / TIME: 202312201354
ISSUE DATE / TIME: 202312201354
Unit Type and Rh: 5100
Unit Type and Rh: 5100
Unit Type and Rh: 5100
Unit Type and Rh: 5100
Unit Type and Rh: 5100
Unit Type and Rh: 5100
Unit Type and Rh: 5100
Unit Type and Rh: 5100

## 2022-10-28 LAB — PROTIME-INR
INR: 1.5 — ABNORMAL HIGH (ref 0.8–1.2)
Prothrombin Time: 18.1 seconds — ABNORMAL HIGH (ref 11.4–15.2)

## 2022-10-28 MED ORDER — WARFARIN SODIUM 4 MG PO TABS
4.0000 mg | ORAL_TABLET | Freq: Every day | ORAL | Status: AC
  Administered 2022-10-28: 4 mg via ORAL
  Filled 2022-10-28: qty 1

## 2022-10-28 NOTE — Progress Notes (Signed)
Cardiology Progress Note  Patient ID: Melanie Ray MRN: [id number] DOB: 1951/01/04 Date of Encounter: 10/28/2022  Primary Cardiologist: Sherren Mocha, MD  Subjective   Chief Complaint: None.   HPI: Awaiting INR level to improve before discharge.  Euvolemic on examination.  ROS:  All other ROS reviewed and negative. Pertinent positives noted in the HPI.     Inpatient Medications  Scheduled Meds:  aspirin EC  81 mg Oral Daily   atorvastatin  80 mg Oral Daily   carvedilol  12.5 mg Oral BID WC   [place] [facility], Patient & Family Education   Does not apply Once   dorzolamide-timolol  1 drop Both Eyes BID   empagliflozin  10 mg Oral Daily   furosemide  40 mg Oral Daily   irbesartan  37.5 mg Oral Daily   latanoprost  1 drop Both Eyes QHS   pantoprazole  40 mg Oral Daily   potassium chloride  20 mEq Oral BID   sodium chloride flush  3 mL Intravenous Q12H   spironolactone  25 mg Oral Daily   traZODone  150 mg Oral QHS   venlafaxine XR  300 mg Oral Q breakfast   warfarin  4 mg Oral q1600   Warfarin - Physician Dosing Inpatient   Does not apply q1600   Continuous Infusions:  sodium chloride     PRN Meds: sodium chloride, loperamide, mouth rinse, oxyCODONE, prochlorperazine, sodium chloride flush, traMADol   Vital Signs   Vitals:   10/28/22 0009 10/28/22 0406 10/28/22 0500 10/28/22 0900  BP: (!) 134/53 (!) 118/57  (!) 121/97  Pulse: 87 87  92  Resp: '20 18 16 '$ (!) 22  Temp: 98.1 F (36.7 C) 97.9 F (36.6 C)  98.1 F (36.7 C)  TempSrc: Oral Oral  Oral  SpO2: 96% 98%  99%  Weight:   59 kg   Height:        Intake/Output Summary (Last 24 hours) at 10/28/2022 1100 Last data filed at 10/27/2022 1938 Gross per 24 hour  Intake 120 ml  Output 500 ml  Net -380 ml      10/28/2022    5:00 AM 10/27/2022    5:00 AM 10/26/2022    5:00 AM  Last 3 Weights  Weight (lbs) 130 lb 1.1 oz 133 lb 9.6 oz 137 lb 12.6 oz  Weight (kg) 59 kg 60.6 kg 62.5 kg       Telemetry  Overnight telemetry shows AV paced heart rate in the 80s, which I personally reviewed.   Physical Exam   Vitals:   10/28/22 0009 10/28/22 0406 10/28/22 0500 10/28/22 0900  BP: (!) 134/53 (!) 118/57  (!) 121/97  Pulse: 87 87  92  Resp: '20 18 16 '$ (!) 22  Temp: 98.1 F (36.7 C) 97.9 F (36.6 C)  98.1 F (36.7 C)  TempSrc: Oral Oral  Oral  SpO2: 96% 98%  99%  Weight:   59 kg   Height:        Intake/Output Summary (Last 24 hours) at 10/28/2022 1100 Last data filed at 10/27/2022 1938 Gross per 24 hour  Intake 120 ml  Output 500 ml  Net -380 ml       10/28/2022    5:00 AM 10/27/2022    5:00 AM 10/26/2022    5:00 AM  Last 3 Weights  Weight (lbs) 130 lb 1.1 oz 133 lb 9.6 oz 137 lb 12.6 oz  Weight (kg) 59 kg 60.6 kg 62.5 kg  Body mass index is 23.04 kg/m.  General: Well nourished, well developed, in no acute distress Head: Atraumatic, normal size  Eyes: PEERLA, EOMI  Neck: Supple, no JVD Endocrine: No thryomegaly Cardiac: Normal S1, S2; RRR; no murmurs, rubs, or gallops Lungs: Clear to auscultation bilaterally, no wheezing, rhonchi or rales  Abd: Soft, nontender, no hepatomegaly  Ext: No edema, pulses 2+ Musculoskeletal: No deformities, BUE and BLE strength normal and equal Skin: Midline sternotomy scar, clean and dry, no evidence of infection Neuro: Alert and oriented to person, place, time, and situation, CNII-XII grossly intact, no focal deficits  Psych: Normal mood and affect   Labs  High Sensitivity Troponin:   Recent Labs  Lab 10/14/22 1025 10/14/22 1330  TROPONINIHS 80* 92*     Cardiac EnzymesNo results for input(s): "TROPONINI" in the last 168 hours. No results for input(s): "TROPIPOC" in the last 168 hours.  Chemistry Recent Labs  Lab 10/24/22 0721 10/24/22 0918 10/25/22 1935 10/26/22 0358 10/27/22 0111  NA 143   < > 139 138 138  K 3.9   < > 4.0 3.9 3.8  CL 114*   < > 109 108 109  CO2 23   < > '23 24 23  '$ GLUCOSE 118*   < > 154* 112*  127*  BUN 26*   < > '20 19 21  '$ CREATININE 1.29*   < > 1.11* 0.95 0.86  CALCIUM 9.8   < > 8.8* 8.9 8.5*  PROT 6.7  --   --   --   --   ALBUMIN 3.7  --   --   --   --   AST 28  --   --   --   --   ALT 25  --   --   --   --   ALKPHOS 105  --   --   --   --   BILITOT 0.8  --   --   --   --   GFRNONAA 44*   < > 53* >60 >60  ANIONGAP 6   < > '7 6 6   '$ < > = values in this interval not displayed.    Hematology Recent Labs  Lab 10/25/22 1935 10/26/22 0358 10/27/22 0111  WBC 8.6 9.4 7.2  RBC 3.40* 3.27* 3.32*  HGB 9.7* 9.4* 9.3*  HCT 29.1* 27.9* 28.6*  MCV 85.6 85.3 86.1  MCH 28.5 28.7 28.0  MCHC 33.3 33.7 32.5  RDW 16.2* 16.0* 15.6*  PLT 115* 115* 116*   BNPNo results for input(s): "BNP", "PROBNP" in the last 168 hours.  DDimer No results for input(s): "DDIMER" in the last 168 hours.   Radiology  DG Chest 2 View  Result Date: 10/27/2022 CLINICAL DATA:  [id number] S/P mitral valve replacement [id number] EXAM: CHEST - 2 VIEW COMPARISON:  10/26/2022 FINDINGS: Cardiac silhouette enlarged. MITRAL VALVE PROSTHESIS. MEDIAN STERNOTOMY WIRES. CALCIFIED AORTA. No evidence of pneumothorax or pleural effusion. No evidence of pulmonary edema. LEFT-SIDED PACER IN PLACE. IMPRESSION: Enlarged cardiac silhouette.  Postop changes. Electronically Signed   By: Sammie Bench M.D.   On: 10/27/2022 10:54    Cardiac Studies  TTE 10/05/2022  1. Left ventricular ejection fraction, by estimation, is 25%. The left  ventricle has severely decreased function. The left ventricle demonstrates  global hypokinesis. The left ventricular internal cavity size was  moderately dilated. Left ventricular  diastolic parameters are consistent with Grade III diastolic dysfunction  (restrictive).   2. Right ventricular systolic function is normal. The  right ventricular  size is normal. There is normal pulmonary artery systolic pressure.   3. Left atrial size was severely dilated.   4. Right atrial size was mildly dilated.   5.  A small pericardial effusion is present.   6. The mitral valve has been repaired/replaced. Moderate to severe mitral  valve regurgitation. This likely represted SLDA of the second Mitra Clip  from 10/04/22. The mean mitral valve gradient is 9.0 mmHg with average  heart rate of 83 bpm.   7. Tricuspid valve regurgitation is mild to moderate.   8. The aortic valve was not well visualized. Aortic valve regurgitation  is mild. Aortic valve sclerosis is present, with no evidence of aortic  valve stenosis.   9. The inferior vena cava is normal in size with greater than 50%  respiratory variability, suggesting right atrial pressure of 3 mmHg.  10. Evidence of atrial level shunting detected by color flow Doppler.   Patient Profile  Melanie Ray is a 71 y.o. female with systolic heart failure (EF 25%), CAD status post PCI, left bundle branch block status post CRT-D, hypertension who was admitted on 10/24/2022 for mitral valve replacement.  Cardiology consulted for systolic heart failure management.   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  Course complicated by pericardial bleeding with reexploration in the OR.  Recovering well. -Stable for discharge from a cardiac standpoint however INR level not at goal.  Continue to monitor per CT surgery.  # Acute on chronic systolic heart failure, EF 25% #Left bundle branch block status post CRT-D -Euvolemic on examination.  Lasix 40 mg daily. - Coreg 12.5 mg twice daily.  Continue irbesartan 37.5 mg daily.  Intolerant of Entresto.  On Aldactone 25 mg daily and [name] 10 mg daily. -She is stable.  # CAD -No symptoms of angina.  Continue aspirin and statin.  [place] will sign off.   Medication Recommendations: Stable on above medications. Other recommendations (labs, testing,  etc): None. Follow up as an outpatient: We will arrange outpatient follow-up in 2 to 4 weeks  For questions or updates, please contact [place] Please consult [email or web address] for contact info under        Signed, [place] Bells T. Audie Box, MD, [facility]  10/28/2022 11:00 AM

## 2022-10-28 NOTE — Progress Notes (Signed)
      [address]       [place] [address]             [phone]        4 Days Post-Op Procedure(s) (LRB): EXPLORATION POST OPERATIVE OPEN HEART (N/A)  Subjective: Patient no longer has loose stool. She hopes to go home.  Objective: Vital signs in last 24 hours: Temp:  [97.8 F (36.6 C)-98.3 F (36.8 C)] 97.9 F (36.6 C) (12/24 0406) Pulse Rate:  [87-90] 87 (12/24 0406) Cardiac Rhythm: A-V Sequential paced (12/23 2000) Resp:  [15-20] 16 (12/24 0500) BP: (114-134)/(47-57) 118/57 (12/24 0406) SpO2:  [95 %-100 %] 98 % (12/24 0406) Weight:  [59 kg] 59 kg (12/24 0500)  Pre op weight  59 kg Current Weight  10/28/22 59 kg       Intake/Output from previous day: 12/23 0701 - 12/24 0700 In: 120 [P.O.:120] Out: 500 [Urine:500]   Physical Exam:  Cardiovascular: AV paced Pulmonary: Clear to auscultation bilaterally Abdomen: Soft, non tender, bowel sounds present. Extremities:Trace lower extremity edema. Wound:Sternal wound is clean and dry.  No erythema or signs of infection.  Lab Results: CBC: Recent Labs    10/26/22 0358 10/27/22 0111  WBC 9.4 7.2  HGB 9.4* 9.3*  HCT 27.9* 28.6*  PLT 115* 116*    BMET:  Recent Labs    10/26/22 0358 10/27/22 0111  NA 138 138  K 3.9 3.8  CL 108 109  CO2 24 23  GLUCOSE 112* 127*  BUN 19 21  CREATININE 0.95 0.86  CALCIUM 8.9 8.5*     PT/INR:  Lab Results  Component Value Date   INR 1.5 (H) 10/28/2022   INR 1.4 (H) 10/27/2022   INR 1.5 (H) 10/26/2022   ABG:  INR: Will add last result for INR, ABG once components are confirmed Will add last 4 CBG results once components are confirmed  Assessment/Plan:  1. CV - AV paced (has CRT-D for LBBB). On Carvedilol 12.5 mg bid, Irbesartan 37.5 mg daily, and Coumadin. INR this am 1.5 (1.4 yesterday). Will increase Coumadin dose to 4 mg. 2.  Pulmonary - On room air. Encourage incentive spirometer. 3. Chronic systolic heart failure-on Lasix 40 mg IV  bid, Spironolactone 25 mg daily, and [name] 10 mg daily. Cardiology following. 4.  Expected post op acute blood loss anemia - Last H and H 9.3 and 28.6 5. Thrombocytopenia-Last platelets this am 116,000. 6. Will discharge once able to determine Coumadin dose.  Kyllian Clingerman M ZimmermanPA-C 8:56 AM

## 2022-10-29 LAB — CBC
HCT: 25 % — ABNORMAL LOW (ref 36.0–46.0)
Hemoglobin: 8.5 g/dL — ABNORMAL LOW (ref 12.0–15.0)
MCH: 29.4 pg (ref 26.0–34.0)
MCHC: 34 g/dL (ref 30.0–36.0)
MCV: 86.5 fL (ref 80.0–100.0)
Platelets: 160 10*3/uL (ref 150–400)
RBC: 2.89 MIL/uL — ABNORMAL LOW (ref 3.87–5.11)
RDW: 14.5 % (ref 11.5–15.5)
WBC: 6 10*3/uL (ref 4.0–10.5)
nRBC: 0 % (ref 0.0–0.2)

## 2022-10-29 LAB — PROTIME-INR
INR: 1.8 — ABNORMAL HIGH (ref 0.8–1.2)
Prothrombin Time: 20.3 seconds — ABNORMAL HIGH (ref 11.4–15.2)

## 2022-10-29 MED ORDER — WARFARIN SODIUM 2 MG PO TABS
3.0000 mg | ORAL_TABLET | Freq: Every day | ORAL | Status: DC
  Administered 2022-10-29 – 2022-10-30 (×2): 3 mg via ORAL
  Filled 2022-10-29 (×2): qty 1

## 2022-10-29 MED ORDER — FE FUM-VIT C-VIT B12-FA 460-60-0.01-1 MG PO CAPS
1.0000 | ORAL_CAPSULE | Freq: Every day | ORAL | Status: DC
  Administered 2022-10-29 – 2022-10-30 (×2): 1 via ORAL
  Filled 2022-10-29 (×2): qty 1

## 2022-10-29 NOTE — Progress Notes (Addendum)
      [address]       [place] [address]             [phone]        5 Days Post-Op Procedure(s) (LRB): EXPLORATION POST OPERATIVE OPEN HEART (N/A)  Subjective: Patient really would like to go home.  Objective: Vital signs in last 24 hours: Temp:  [98 F (36.7 C)-98.6 F (37 C)] 98 F (36.7 C) (12/25 0407) Pulse Rate:  [87-95] 95 (12/25 0407) Cardiac Rhythm: A-V Sequential paced (12/24 1900) Resp:  [11-22] 20 (12/25 0407) BP: (95-121)/(42-97) 120/55 (12/25 0407) SpO2:  [97 %-100 %] 97 % (12/25 0407) Weight:  [58.2 kg] 58.2 kg (12/25 0407)  Pre op weight  59 kg Current Weight  10/29/22 58.2 kg       Intake/Output from previous day: 12/24 0701 - 12/25 0700 In: 120 [P.O.:120] Out: -    Physical Exam:  Cardiovascular: AV paced Pulmonary: Clear to auscultation bilaterally Abdomen: Soft, non tender, bowel sounds present. Extremities:Trace lower extremity edema. Wound:Sternal wound is clean and dry.  No erythema or signs of infection.  Lab Results: CBC: Recent Labs    10/27/22 0111 10/29/22 0052  WBC 7.2 6.0  HGB 9.3* 8.5*  HCT 28.6* 25.0*  PLT 116* 160    BMET:  Recent Labs    10/27/22 0111  NA 138  K 3.8  CL 109  CO2 23  GLUCOSE 127*  BUN 21  CREATININE 0.86  CALCIUM 8.5*     PT/INR:  Lab Results  Component Value Date   INR 1.8 (H) 10/29/2022   INR 1.5 (H) 10/28/2022   INR 1.4 (H) 10/27/2022   ABG:  INR: Will add last result for INR, ABG once components are confirmed Will add last 4 CBG results once components are confirmed  Assessment/Plan:  1. CV - AV paced (has CRT-D for LBBB). On Carvedilol 12.5 mg bid, Irbesartan 37.5 mg daily, and Coumadin. INR this am increased from 1.5 to 1.8. She has been given  3 mg on 12/23 and 4 mg 12/24. Will discuss dose with Dr. Tenny Craw 2.  Pulmonary - On room air. Encourage incentive spirometer. 3. Chronic systolic heart failure-on Lasix 40 mg IV bid, Spironolactone 25 mg  daily, and [name] 10 mg daily. Cardiology following. 4.  Expected post op acute blood loss anemia - H and H this am decreased to 8.5 and 25. Start Trinsicon 5. Thrombocytopenia-Last platelets this am 116,000. 6. Will  discuss disposition with Dr. Marrian Salvage M ZimmermanPA-C 8:40 AM   DR Tenny Craw ADDENDUM Saturday '3mg'$  coumadin Sunday INR 1.5, '4mg'$  coumadin Monday INR 1.8, '3mg'$  coumadin Considered home today but chronically ill complex patient No INR followup is currently scheduled.   Plan DC tomorrow AM when INR followup appointment scheduled.

## 2022-10-30 ENCOUNTER — Other Ambulatory Visit: Payer: Self-pay | Admitting: *Deleted

## 2022-10-30 ENCOUNTER — Other Ambulatory Visit (HOSPITAL_COMMUNITY): Payer: Self-pay

## 2022-10-30 LAB — CBC
HCT: 27.5 % — ABNORMAL LOW (ref 36.0–46.0)
Hemoglobin: 9.1 g/dL — ABNORMAL LOW (ref 12.0–15.0)
MCH: 28.8 pg (ref 26.0–34.0)
MCHC: 33.1 g/dL (ref 30.0–36.0)
MCV: 87 fL (ref 80.0–100.0)
Platelets: 221 10*3/uL (ref 150–400)
RBC: 3.16 MIL/uL — ABNORMAL LOW (ref 3.87–5.11)
RDW: 14.3 % (ref 11.5–15.5)
WBC: 6.5 10*3/uL (ref 4.0–10.5)
nRBC: 0 % (ref 0.0–0.2)

## 2022-10-30 LAB — BASIC METABOLIC PANEL
Anion gap: 8 (ref 5–15)
BUN: 21 mg/dL (ref 8–23)
CO2: 24 mmol/L (ref 22–32)
Calcium: 9.1 mg/dL (ref 8.9–10.3)
Chloride: 106 mmol/L (ref 98–111)
Creatinine, Ser: 1 mg/dL (ref 0.44–1.00)
GFR, Estimated: 60 mL/min — ABNORMAL LOW (ref >60)
Glucose, Bld: 127 mg/dL — ABNORMAL HIGH (ref 70–99)
Potassium: 4.3 mmol/L (ref 3.5–5.1)
Sodium: 138 mmol/L (ref 135–145)

## 2022-10-30 LAB — PROTIME-INR
INR: 1.9 — ABNORMAL HIGH (ref 0.8–1.2)
Prothrombin Time: 21.3 seconds — ABNORMAL HIGH (ref 11.4–15.2)

## 2022-10-30 MED ORDER — WARFARIN SODIUM 3 MG PO TABS
3.0000 mg | ORAL_TABLET | Freq: Every day | ORAL | 1 refills | Status: DC
  Filled 2022-10-30: qty 30, 30d supply, fill #0

## 2022-10-30 MED ORDER — CARVEDILOL 12.5 MG PO TABS
12.5000 mg | ORAL_TABLET | Freq: Two times a day (BID) | ORAL | 1 refills | Status: DC
  Filled 2022-10-30: qty 60, 30d supply, fill #0

## 2022-10-30 MED ORDER — OXYCODONE HCL 5 MG PO TABS
5.0000 mg | ORAL_TABLET | Freq: Four times a day (QID) | ORAL | 0 refills | Status: DC | PRN
  Filled 2022-10-30: qty 28, 7d supply, fill #0

## 2022-10-30 MED ORDER — FUROSEMIDE 40 MG PO TABS
40.0000 mg | ORAL_TABLET | Freq: Every day | ORAL | 1 refills | Status: DC
  Filled 2022-10-30: qty 30, 30d supply, fill #0

## 2022-10-30 MED ORDER — IRBESARTAN 75 MG PO TABS
37.5000 mg | ORAL_TABLET | Freq: Every day | ORAL | 1 refills | Status: DC
  Filled 2022-10-30: qty 30, 60d supply, fill #0

## 2022-10-30 MED ORDER — POTASSIUM CHLORIDE CRYS ER 20 MEQ PO TBCR
20.0000 meq | EXTENDED_RELEASE_TABLET | Freq: Every day | ORAL | 1 refills | Status: DC
  Filled 2022-10-30: qty 30, 30d supply, fill #0

## 2022-10-30 MED ORDER — EMPAGLIFLOZIN 10 MG PO TABS
10.0000 mg | ORAL_TABLET | Freq: Every day | ORAL | 1 refills | Status: DC
  Filled 2022-10-30: qty 30, 30d supply, fill #0

## 2022-10-30 MED ORDER — FERROUS SULFATE 325 (65 FE) MG PO TABS: 325.0000 mg | ORAL_TABLET | Freq: Every day | ORAL | 0 refills | Status: DC

## 2022-10-30 NOTE — Progress Notes (Addendum)
      [address]       [place] [address]             [phone]      6 Days Post-Op Procedure(s) (LRB): EXPLORATION POST OPERATIVE OPEN HEART (N/A) Subjective: Patient states she is planning on going home today. She feels well, no new complaints.  Objective: Vital signs in last 24 hours: Temp:  [98.1 F (36.7 C)-98.8 F (37.1 C)] 98.1 F (36.7 C) (12/26 0349) Pulse Rate:  [89-101] 89 (12/26 0349) Cardiac Rhythm: A-V Sequential paced (12/25 1955) Resp:  [15-19] 17 (12/26 0349) BP: (102-134)/(44-54) 134/48 (12/26 0349) SpO2:  [97 %-99 %] 99 % (12/26 0349) Weight:  [58.1 kg] 58.1 kg (12/26 0641)  Hemodynamic parameters for last 24 hours:    Intake/Output from previous day: 12/25 0701 - 12/26 0700 In: 730 [P.O.:730] Out: 250 [Urine:250] Intake/Output this shift: No intake/output data recorded.  General appearance: alert, cooperative, and no distress Neurologic: intact Heart: A-V paced, no murmur Lungs: clear to auscultation bilaterally Abdomen: soft, non-tender; bowel sounds normal; no masses,  no organomegaly Extremities: edema trace Wound: Clean, dry, intact. No erythema or signs of infection  Lab Results: Recent Labs    10/29/22 0052  WBC 6.0  HGB 8.5*  HCT 25.0*  PLT 160   BMET: No results for input(s): "NA", "K", "CL", "CO2", "GLUCOSE", "BUN", "CREATININE", "CALCIUM" in the last 72 hours.  PT/INR:  Recent Labs    10/30/22 0144  LABPROT 21.3*  INR 1.9*   ABG    Component Value Date/Time   PHART 7.316 (L) 10/24/2022 2053   HCO3 20.7 10/24/2022 2053   TCO2 22 10/24/2022 2053   ACIDBASEDEF 5.0 (H) 10/24/2022 2053   O2SAT 78.2 10/26/2022 0358   CBG (last 3)  No results for input(s): "GLUCAP" in the last 72 hours.  Assessment/Plan: S/P Procedure(s) (LRB): EXPLORATION POST OPERATIVE OPEN HEART (N/A)  CV: A-V paced (has CRT-D for LBBB). SBP 102-134. On Coreg 12.'5mg'$  BID, irbesartan 27.'5mg'$  QD and Coumadin. Last Coumadin dose  '3mg'$ . INR 1.9. Awaiting Coumadin clinic PT/INR follow up. Will discuss with surgeon Coumadin dose.  Chronic systolic heart failure-on Lasix '40mg'$  QD, spironolactone '25mg'$  QD and jardiance '10mg'$  QD. Cardiology following, appreciate their assistance.   Pulm: saturating well on RA. Walking without walker assistance. Last CXR 12/24 showed Clear lungs. Encouraged ambulation and IS.   GI: +BM, good appetite  Expected postop ABLA: last H/H 8.5/25, continue iron supplement.   Thrombocytopenia: resolved 160,000  Dispo: Plan to d/c to home when PT/INR appointment setup. Home health PT/OT ordered. Will discuss with surgeon Coumadin dose, thinking Coumadin '3mg'$  QD.     LOS: 6 days    Magdalene River, PA-C 10/30/2022  Patient seen and examined, agree with above Just completed walk without any tachycardia (paced) or tachypnea Home once arrangements complete for INR monitoring  Onna Nodal C. Roxan Hockey, MD [facility] [phone]

## 2022-10-30 NOTE — TOC Progression Note (Signed)
Transition of Care [facility]) - Progression Note    Patient Details  Name: Melanie Ray MRN: [id number] Date of Birth: December 28, 1950  Transition of Care [facility]) CM/SW Contact  Erenest Rasher, RN Phone Number: [phone] 10/30/2022, 11:34 AM  Clinical Narrative:    Patient is being followed by [facility] for surgical protocol. Contracted [employer] rep, Caryl Pina to make aware.    Expected Discharge Plan: [place] Barriers to Discharge: Continued Medical Work up  Expected Discharge Plan and Services     Post Acute Care Choice: [place]: RN, PT [facility] Agency: [place] Date [place]: 10/30/22 Time [place]: [phone] Representative spoke with at [place]: [place] Determinants of Health ([place]) Interventions [place]: No Food Insecurity (10/17/2022)  Housing: Low Risk  (10/17/2022)  Transportation Needs: No Transportation Needs (10/17/2022)  Utilities: Not At Risk (10/15/2022)  Depression (PHQ2-9): Medium Risk (09/25/2022)  Financial Resource Strain: Low Risk  (03/30/2022)  Social Connections: Unknown (03/30/2022)  Stress: No Stress Concern Present (03/30/2022)  Tobacco Use: Medium Risk (10/25/2022)    Readmission Risk Interventions     No data to display

## 2022-10-30 NOTE — Progress Notes (Signed)
CARDIAC REHAB PHASE I        Pt feeling well this morning. Ambulated with PT this morning tolerating well and walking around room preparing for hopeful discharge home later today. Reviewed home education provided on Saturday, all questions and concerns addressed. Referral sent o [facility] for CRP2.   [phone]  Vanessa Barbara, RN BSN 10/30/2022 9:52 AM

## 2022-10-30 NOTE — TOC Transition Note (Signed)
Transition of Care [facility]) - CM/SW Discharge Note   Patient Details  Name: Melanie Ray MRN: [id number] Date of Birth: [birth year]  Transition of Care [facility]) CM/SW Contact:  Erenest Rasher, RN Phone Number: [phone] 10/30/2022, 1:38 PM   Clinical Narrative:    HF TOC CM spoke to pt and declined shower chair. Did not want to pay out of pocket. Contacted [facility] to make aware of dc home to day with [facility].    Final next level of care: Home w Home Health Services Barriers to Discharge: No Barriers Identified   Patient Goals and CMS Choice CMS Medicare.gov Compare Post Acute Care list provided to:: Patient Choice offered to / list presented to : Patient  Discharge Placement                         Discharge Plan and Services Additional resources added to the After Visit Summary for       Post Acute Care Choice: Home Health                    HH Arranged: RN, PT [facility] Agency: [place] Date [place]: 10/30/22 Time [place]: [phone] Representative spoke with at [place]: [place] Determinants of Health ([place]) Interventions [place]: No Food Insecurity (10/17/2022)  Housing: Low Risk  (10/17/2022)  Transportation Needs: No Transportation Needs (10/17/2022)  Utilities: Not At Risk (10/15/2022)  Depression (PHQ2-9): Medium Risk (09/25/2022)  Financial Resource Strain: Low Risk  (03/30/2022)  Social Connections: Unknown (03/30/2022)  Stress: No Stress Concern Present (03/30/2022)  Tobacco Use: Medium Risk (10/25/2022)     Readmission Risk Interventions     No data to display

## 2022-10-30 NOTE — Progress Notes (Signed)
Physical Therapy Treatment and Discharge Patient Details Name: Melanie Ray MRN: [id number] DOB: 1951/03/31 Today's Date: 10/30/2022   History of Present Illness Pt is a 71 y.o. female s/p recent MVR (10/04/2022), now admitted 10/24/22 for same day MVR replacement. Pt with mediastinal bleeding post-op requiring return to OR 12/20 for re-exploration and redo sternotomy; extubated same day. PMH includes CAD, AICD, CHF, MI, HTN, pre-diabetes, stroke, OSA, anxiety, depression, OCD.    PT Comments    Pt reports good pain control and is overall mobilizing well. Pt ambulating 400 ft with no assistive device or physical assist. HR 92-100 bpm. Pt able to perform bed mobility from a flat bed to simulate home set up. Reviewed sternal precautions, activity recommendations and progression. No further acute PT needs. See below for recommendations.   Recommendations for follow up therapy are one component of a multi-disciplinary discharge planning process, led by the attending physician.  Recommendations may be updated based on patient status, additional functional criteria and insurance authorization.  Follow Up Recommendations  Home health PT     Assistance Recommended at Discharge Intermittent Supervision/Assistance  Patient can return home with the following A little help with bathing/dressing/bathroom;Assistance with cooking/housework;Assist for transportation;Help with stairs or ramp for entrance   Equipment Recommendations  Other (comment) (shower seat)    Recommendations for Other Services       Precautions / Restrictions Precautions Precautions: Fall;Sternal Precaution Booklet Issued: Yes (comment) Restrictions Weight Bearing Restrictions: No     Mobility  Bed Mobility Overal bed mobility: Modified Independent             General bed mobility comments: Cues for sternal precautions (holding onto pillow)    Transfers Overall transfer level: Independent Equipment  used: None                    Ambulation/Gait Ambulation/Gait assistance: Supervision Gait Distance (Feet): 400 Feet Assistive device: None Gait Pattern/deviations: Step-through pattern, Decreased stride length Gait velocity: decreased     General Gait Details: Mild dynamic instability, no overt LOB   Stairs             Wheelchair Mobility    Modified Rankin (Stroke Patients Only)       Balance Overall balance assessment: Needs assistance Sitting-balance support: No upper extremity supported, Feet supported Sitting balance-Leahy Scale: Good     Standing balance support: No upper extremity supported, During functional activity Standing balance-Leahy Scale: Good                              Cognition Arousal/Alertness: Awake/alert Behavior During Therapy: WFL for tasks assessed/performed Overall Cognitive Status: Within Functional Limits for tasks assessed                                          Exercises      General Comments        Pertinent Vitals/Pain Pain Assessment Pain Assessment: No/denies pain    Home Living                          Prior Function            PT Goals (current goals can now be found in the care plan section) Acute Rehab PT Goals Patient Stated Goal: return home, regain strength Potential  to Achieve Goals: Good Progress towards PT goals: Goals met/education completed, patient discharged from PT    Frequency    Min 3X/week      PT Plan Other (comment) (d/c acute PT)    Co-evaluation              AM-PAC PT "6 Clicks" Mobility   Outcome Measure  Help needed turning from your back to your side while in a flat bed without using bedrails?: None Help needed moving from lying on your back to sitting on the side of a flat bed without using bedrails?: None Help needed moving to and from a bed to a chair (including a wheelchair)?: None Help needed standing up from a  chair using your arms (e.g., wheelchair or bedside chair)?: None Help needed to walk in hospital room?: A Little Help needed climbing 3-5 steps with a railing? : A Little 6 Click Score: 22    End of Session   Activity Tolerance: Patient tolerated treatment well Patient left: in bed;with call bell/phone within reach Nurse Communication: Mobility status PT Visit Diagnosis: Other abnormalities of gait and mobility (R26.89);Muscle weakness (generalized) (M62.81)     Time: 5947-0761 PT Time Calculation (min) (ACUTE ONLY): 16 min  Charges:  $Therapeutic Activity: 8-22 mins                     Wyona Almas, PT, DPT Acute Rehabilitation Services Office [phone]    Deno Etienne 10/30/2022, 8:37 AM

## 2022-10-31 ENCOUNTER — Encounter (HOSPITAL_COMMUNITY): Payer: Medicare Other | Admitting: Internal Medicine

## 2022-10-31 ENCOUNTER — Ambulatory Visit: Payer: Medicare Other

## 2022-10-31 ENCOUNTER — Telehealth: Payer: Self-pay

## 2022-10-31 MED FILL — Heparin Sodium (Porcine) Inj 1000 Unit/ML: INTRAMUSCULAR | Qty: 30 | Status: AC

## 2022-10-31 MED FILL — Sodium Chloride IV Soln 0.9%: INTRAVENOUS | Qty: 3000 | Status: AC

## 2022-10-31 NOTE — Patient Instructions (Addendum)
Discharge Instructions:  1. You may shower, please wash incisions daily with soap and water and keep dry.  If you wish to cover wounds with dressing you may do so but please keep clean and change daily.  No tub baths or swimming until incisions have completely healed.  If your incisions become red or develop any drainage please call our office at [phone]  2. You may drive in 2 weeks since you are no longer using pain medication  3. Monitor your weight daily.. Please use the same scale and weigh at same time... If you gain 5-10 lbs in 48 hours with associated lower extremity swelling, please contact our office at [phone]  4. Fever of 101.5 for at least 24 hours with no source, please contact our office at [phone]  5. Activity- up as tolerated, please walk at least 3 times per day.  Avoid strenuous activity, no lifting, pushing, or pulling with your arms over 8-10 lbs for a minimum of 6 weeks  6. If any questions or concerns arise, please do not hesitate to contact our office at [phone]

## 2022-10-31 NOTE — Progress Notes (Signed)
[address]       ,[place] [address]             [phone]    HPI: Patient returns for 1 week follow up from hospital discharge.  She is S/P Mitral  Valve Replacement performed 10/24/22.  She required re-exploration for Mediastinal bleeding.   The patient's early postoperative recovery while in the hospital was without complication.  She was discharged home on coumadin.   Since hospital discharge the patient reports she is doing okay.  She states that she continues to feel short of breath and dizzy upon standing.  However, when asked she agreed she feels better than she did a week ago.  She is ambulating without a lot of difficulty.  Her weight is at baseline and she denies swelling.  Her surgical incisions are healing without evidence of infection.  She is not using narcotic pain medication  Current Outpatient Medications  Medication Sig Dispense Refill   aspirin EC 81 MG tablet Take 1 tablet (81 mg total) by mouth daily. Swallow whole. 30 tablet 12   atorvastatin (LIPITOR) 80 MG tablet Take 1 tablet by mouth once daily (Patient taking differently: Take 80 mg by mouth daily.) 90 tablet 3   carvedilol (COREG) 12.5 MG tablet Take 1 tablet (12.5 mg total) by mouth 2 (two) times daily with a meal. 60 tablet 1   dorzolamide-timolol (COSOPT) 22.3-6.8 MG/ML ophthalmic solution Place 1 drop into both eyes 2 (two) times daily.      empagliflozin ([name]) 10 MG TABS tablet Take 1 tablet (10 mg total) by mouth daily. 30 tablet 1   ferrous sulfate 325 (65 FE) MG tablet Take 1 tablet (325 mg total) by mouth daily. 30 tablet 0   furosemide (LASIX) 40 MG tablet Take 1 tablet (40 mg total) by mouth daily. 30 tablet 1   irbesartan (AVAPRO) 75 MG tablet Take 1/2 tablet (37.5 mg total) by mouth daily. 30 tablet 1   latanoprost (XALATAN) 0.005 % ophthalmic solution Place 1 drop into both eyes at bedtime.     lumateperone tosylate (CAPLYTA) 42 MG capsule Take 42 mg by mouth at bedtime.      oxyCODONE (OXY IR/ROXICODONE) 5 MG immediate release tablet Take 1 tablet (5 mg total) by mouth every 6 (six) hours as needed for severe pain. 28 tablet 0   potassium chloride SA (KLOR-CON M) 20 MEQ tablet Take 1 tablet (20 mEq total) by mouth daily. 30 tablet 1   spironolactone (ALDACTONE) 25 MG tablet Take 1 tablet (25 mg total) by mouth daily. 30 tablet 6   traZODone (DESYREL) 150 MG tablet Take 150 mg by mouth at bedtime.     venlafaxine XR (EFFEXOR-XR) 150 MG 24 hr capsule TAKE 2 CAPSULES BY MOUTH ONCE DAILY (NEEDS to follow-up with psychiatry for further refills) (Patient taking differently: Take 300 mg by mouth daily.) 60 capsule 0   warfarin (COUMADIN) 3 MG tablet Take 1 tablet (3 mg total) by mouth daily. 30 tablet 1   No current facility-administered medications for this visit.    Physical Exam:  BP 126/69   Pulse 85   Resp 20   Wt 133 lb (60.3 kg)   SpO2 100%   BMI 23.56 kg/m   Gen: NAD Heart: RRR Lungs: CTA bilaterally Ext: no edema Incisions: well healed  Diagnostic Tests:  CXR: no pleural effusions, sternal wires intact, PPM/ICD on left chest   S/P MVR 12/20- overall she is  doing very well INR is being monitored and is currently at 1.8 Dizziness- could be related to mild dehydration.. she is on Spironolactone and Lasix, will change Lasix to prn regimen to see if this helps patient's symptoms Activity- continue ambulation, patient again provided recovery instructions.. she may resume driving in next 1-2 weeks as she isn't taking pain medications RTC in 2-3 weeks with Dr. Odelia Gage, PA-C [facility] [phone]

## 2022-10-31 NOTE — Telephone Encounter (Signed)
Pt scheduled for New Coumadin appt today at 2:45pm. Pt was discharged from hospital yesterday, 10/30/22 and INR was 1.9. Called pt to reschedule INR appt to Friday; however, pt said she was weak and was not coming to any appts any time soon since she was discharged yesterday.   Pt was set up with [facility] at discharge. [place] and spoke with Kathlee Nations, [place]. RN is scheduled to complete first [place] visit tomorrow, 11/01/22. Provided verbal order to check INR and report INR to Coumadin Clinic at pt's home to [phone]. Kathlee Nations, RN verbalized understanding and stated RN would call with INR results tomorrow.   Dx: Mitral Valve Replacement  Admission: 12/20-12/26 MD: Cooper/Lambert  Discharged on Warfarin '3mg'$  12/22: Warfarin '2mg'$ / INR 1.5 12/23: Warfarin '3mg'$ / INR 1.4 12/24: Warfarin '4mg'$ / INR 1.5 12/25: Warfarin '3mg'$ / INR 1.8 12/26: Warfarin '3mg'$ / INR 1.9

## 2022-11-01 ENCOUNTER — Ambulatory Visit: Payer: Medicare Other

## 2022-11-01 ENCOUNTER — Ambulatory Visit (INDEPENDENT_AMBULATORY_CARE_PROVIDER_SITE_OTHER): Payer: Medicare Other | Admitting: Cardiology

## 2022-11-01 DIAGNOSIS — I639 Cerebral infarction, unspecified: Secondary | ICD-10-CM | POA: Diagnosis not present

## 2022-11-01 DIAGNOSIS — Z48812 Encounter for surgical aftercare following surgery on the circulatory system: Secondary | ICD-10-CM | POA: Diagnosis not present

## 2022-11-01 DIAGNOSIS — Z79891 Long term (current) use of opiate analgesic: Secondary | ICD-10-CM | POA: Diagnosis not present

## 2022-11-01 DIAGNOSIS — Z952 Presence of prosthetic heart valve: Secondary | ICD-10-CM | POA: Diagnosis not present

## 2022-11-01 DIAGNOSIS — Z7901 Long term (current) use of anticoagulants: Secondary | ICD-10-CM | POA: Diagnosis not present

## 2022-11-01 DIAGNOSIS — Z9889 Other specified postprocedural states: Secondary | ICD-10-CM | POA: Diagnosis not present

## 2022-11-01 DIAGNOSIS — I110 Hypertensive heart disease with heart failure: Secondary | ICD-10-CM | POA: Diagnosis not present

## 2022-11-01 DIAGNOSIS — Z7982 Long term (current) use of aspirin: Secondary | ICD-10-CM | POA: Diagnosis not present

## 2022-11-01 DIAGNOSIS — I340 Nonrheumatic mitral (valve) insufficiency: Secondary | ICD-10-CM | POA: Diagnosis not present

## 2022-11-01 DIAGNOSIS — Z7984 Long term (current) use of oral hypoglycemic drugs: Secondary | ICD-10-CM | POA: Diagnosis not present

## 2022-11-01 DIAGNOSIS — I5023 Acute on chronic systolic (congestive) heart failure: Secondary | ICD-10-CM | POA: Diagnosis not present

## 2022-11-01 DIAGNOSIS — I447 Left bundle-branch block, unspecified: Secondary | ICD-10-CM | POA: Diagnosis not present

## 2022-11-01 DIAGNOSIS — I2510 Atherosclerotic heart disease of native coronary artery without angina pectoris: Secondary | ICD-10-CM | POA: Diagnosis not present

## 2022-11-01 LAB — POCT INR: INR: 1.6 — AB (ref 2.0–3.0)

## 2022-11-01 NOTE — Patient Instructions (Addendum)
Description   Called and spoke to pt who stated that she only took 1 warfarin tablet on 12/28. Instructed pt to take 2 tablets on 12/29. Then START taking warfarin 1 tablet daily expcet for 1.5 tablets on Mondays. Recheck INR on Tuesday by [facility].  Be consistent with 3-4 serving of greens per week. Coumadin Clinic [phone]  A full discussion of the nature of anticoagulants has been carried out.  A benefit risk analysis has been presented to the patient, so that they understand the justification for choosing anticoagulation at this time. The need for frequent and regular monitoring, precise dosage adjustment and compliance is stressed.  Side effects of potential bleeding are discussed.  The patient should avoid any OTC items containing aspirin or ibuprofen, and should avoid great swings in general diet.  Avoid alcohol consumption.  Call if any signs of abnormal bleeding.

## 2022-11-06 ENCOUNTER — Ambulatory Visit (INDEPENDENT_AMBULATORY_CARE_PROVIDER_SITE_OTHER): Payer: Medicare Other | Admitting: Internal Medicine

## 2022-11-06 ENCOUNTER — Telehealth: Payer: Self-pay | Admitting: Family Medicine

## 2022-11-06 DIAGNOSIS — Z5181 Encounter for therapeutic drug level monitoring: Secondary | ICD-10-CM

## 2022-11-06 DIAGNOSIS — I340 Nonrheumatic mitral (valve) insufficiency: Secondary | ICD-10-CM | POA: Diagnosis not present

## 2022-11-06 DIAGNOSIS — I5023 Acute on chronic systolic (congestive) heart failure: Secondary | ICD-10-CM | POA: Diagnosis not present

## 2022-11-06 DIAGNOSIS — Z9889 Other specified postprocedural states: Secondary | ICD-10-CM | POA: Diagnosis not present

## 2022-11-06 DIAGNOSIS — I447 Left bundle-branch block, unspecified: Secondary | ICD-10-CM | POA: Diagnosis not present

## 2022-11-06 DIAGNOSIS — I110 Hypertensive heart disease with heart failure: Secondary | ICD-10-CM | POA: Diagnosis not present

## 2022-11-06 DIAGNOSIS — I639 Cerebral infarction, unspecified: Secondary | ICD-10-CM

## 2022-11-06 DIAGNOSIS — Z48812 Encounter for surgical aftercare following surgery on the circulatory system: Secondary | ICD-10-CM | POA: Diagnosis not present

## 2022-11-06 DIAGNOSIS — Z952 Presence of prosthetic heart valve: Secondary | ICD-10-CM

## 2022-11-06 DIAGNOSIS — R413 Other amnesia: Secondary | ICD-10-CM

## 2022-11-06 LAB — POCT INR: INR: 1.8 — AB (ref 2.0–3.0)

## 2022-11-06 NOTE — Telephone Encounter (Signed)
I called the patient and she stated she wanted a referral to a neurologist, not the neurologist she previously saw in 2023 she would like a new one, she stated she can't remember much.  Christean Silvestri,cma

## 2022-11-06 NOTE — Telephone Encounter (Signed)
Pt called stating she need a referral to a neurology because she can't remember anything. Pt would like to be called

## 2022-11-07 ENCOUNTER — Ambulatory Visit (INDEPENDENT_AMBULATORY_CARE_PROVIDER_SITE_OTHER): Payer: Self-pay | Admitting: Physician Assistant

## 2022-11-07 ENCOUNTER — Other Ambulatory Visit (HOSPITAL_COMMUNITY): Payer: Medicare Other

## 2022-11-07 ENCOUNTER — Other Ambulatory Visit: Payer: Self-pay | Admitting: Thoracic Surgery (Cardiothoracic Vascular Surgery)

## 2022-11-07 ENCOUNTER — Ambulatory Visit
Admission: RE | Admit: 2022-11-07 | Discharge: 2022-11-07 | Disposition: A | Discharge status: Home / Self Care | Payer: Medicare Other | Source: Ambulatory Visit | Attending: Thoracic Surgery (Cardiothoracic Vascular Surgery) | Admitting: Thoracic Surgery (Cardiothoracic Vascular Surgery)

## 2022-11-07 ENCOUNTER — Ambulatory Visit: Payer: Medicare Other

## 2022-11-07 VITALS — BP 126/69 | HR 85 | Resp 20 | Wt 133.0 lb

## 2022-11-07 DIAGNOSIS — Z9889 Other specified postprocedural states: Secondary | ICD-10-CM

## 2022-11-07 DIAGNOSIS — Z95818 Presence of other cardiac implants and grafts: Secondary | ICD-10-CM

## 2022-11-07 DIAGNOSIS — Z79899 Other long term (current) drug therapy: Secondary | ICD-10-CM | POA: Diagnosis not present

## 2022-11-07 DIAGNOSIS — Z952 Presence of prosthetic heart valve: Secondary | ICD-10-CM

## 2022-11-07 DIAGNOSIS — F251 Schizoaffective disorder, depressive type: Secondary | ICD-10-CM | POA: Diagnosis not present

## 2022-11-07 DIAGNOSIS — R0602 Shortness of breath: Secondary | ICD-10-CM | POA: Diagnosis not present

## 2022-11-07 DIAGNOSIS — I340 Nonrheumatic mitral (valve) insufficiency: Secondary | ICD-10-CM

## 2022-11-07 NOTE — Telephone Encounter (Signed)
I called the patient and she is okay with going to [place] for the referral.  Mukesh Kornegay,cma

## 2022-11-07 NOTE — Telephone Encounter (Signed)
I already put the referral in.

## 2022-11-07 NOTE — Telephone Encounter (Signed)
Noted.  I will put a referral in though she will have to go to [facility] to see somebody different.

## 2022-11-08 ENCOUNTER — Other Ambulatory Visit (HOSPITAL_COMMUNITY): Payer: Self-pay

## 2022-11-08 ENCOUNTER — Encounter (HOSPITAL_COMMUNITY): Payer: Self-pay

## 2022-11-08 ENCOUNTER — Telehealth (HOSPITAL_COMMUNITY): Payer: Self-pay

## 2022-11-08 ENCOUNTER — Ambulatory Visit (HOSPITAL_COMMUNITY)
Admission: RE | Admit: 2022-11-08 | Discharge: 2022-11-08 | Disposition: A | Discharge status: Home / Self Care | Payer: Medicare Other | Source: Ambulatory Visit | Attending: Physician Assistant | Admitting: Physician Assistant

## 2022-11-08 VITALS — Wt 133.8 lb

## 2022-11-08 DIAGNOSIS — G4733 Obstructive sleep apnea (adult) (pediatric): Secondary | ICD-10-CM | POA: Insufficient documentation

## 2022-11-08 DIAGNOSIS — I2510 Atherosclerotic heart disease of native coronary artery without angina pectoris: Secondary | ICD-10-CM | POA: Diagnosis not present

## 2022-11-08 DIAGNOSIS — I340 Nonrheumatic mitral (valve) insufficiency: Secondary | ICD-10-CM

## 2022-11-08 DIAGNOSIS — Z7982 Long term (current) use of aspirin: Secondary | ICD-10-CM | POA: Insufficient documentation

## 2022-11-08 DIAGNOSIS — I110 Hypertensive heart disease with heart failure: Secondary | ICD-10-CM | POA: Diagnosis not present

## 2022-11-08 DIAGNOSIS — I5023 Acute on chronic systolic (congestive) heart failure: Secondary | ICD-10-CM | POA: Diagnosis not present

## 2022-11-08 DIAGNOSIS — Z79899 Other long term (current) drug therapy: Secondary | ICD-10-CM | POA: Diagnosis not present

## 2022-11-08 DIAGNOSIS — Z954 Presence of other heart-valve replacement: Secondary | ICD-10-CM | POA: Diagnosis not present

## 2022-11-08 DIAGNOSIS — Z95810 Presence of automatic (implantable) cardiac defibrillator: Secondary | ICD-10-CM | POA: Diagnosis not present

## 2022-11-08 DIAGNOSIS — I951 Orthostatic hypotension: Secondary | ICD-10-CM | POA: Diagnosis not present

## 2022-11-08 DIAGNOSIS — Z48812 Encounter for surgical aftercare following surgery on the circulatory system: Secondary | ICD-10-CM | POA: Diagnosis not present

## 2022-11-08 DIAGNOSIS — R0609 Other forms of dyspnea: Secondary | ICD-10-CM | POA: Insufficient documentation

## 2022-11-08 DIAGNOSIS — Z9889 Other specified postprocedural states: Secondary | ICD-10-CM | POA: Diagnosis not present

## 2022-11-08 DIAGNOSIS — R531 Weakness: Secondary | ICD-10-CM | POA: Insufficient documentation

## 2022-11-08 DIAGNOSIS — E785 Hyperlipidemia, unspecified: Secondary | ICD-10-CM | POA: Diagnosis not present

## 2022-11-08 DIAGNOSIS — I255 Ischemic cardiomyopathy: Secondary | ICD-10-CM | POA: Insufficient documentation

## 2022-11-08 DIAGNOSIS — R42 Dizziness and giddiness: Secondary | ICD-10-CM | POA: Insufficient documentation

## 2022-11-08 DIAGNOSIS — I5022 Chronic systolic (congestive) heart failure: Secondary | ICD-10-CM

## 2022-11-08 DIAGNOSIS — Z7984 Long term (current) use of oral hypoglycemic drugs: Secondary | ICD-10-CM | POA: Insufficient documentation

## 2022-11-08 DIAGNOSIS — I447 Left bundle-branch block, unspecified: Secondary | ICD-10-CM

## 2022-11-08 DIAGNOSIS — I252 Old myocardial infarction: Secondary | ICD-10-CM | POA: Insufficient documentation

## 2022-11-08 LAB — CBC
HCT: 31.4 % — ABNORMAL LOW (ref 36.0–46.0)
Hemoglobin: 9.6 g/dL — ABNORMAL LOW (ref 12.0–15.0)
MCH: 27.8 pg (ref 26.0–34.0)
MCHC: 30.6 g/dL (ref 30.0–36.0)
MCV: 91 fL (ref 80.0–100.0)
Platelets: 371 10*3/uL (ref 150–400)
RBC: 3.45 MIL/uL — ABNORMAL LOW (ref 3.87–5.11)
RDW: 14.6 % (ref 11.5–15.5)
WBC: 6 10*3/uL (ref 4.0–10.5)
nRBC: 0 % (ref 0.0–0.2)

## 2022-11-08 LAB — COMPREHENSIVE METABOLIC PANEL
ALT: 23 U/L (ref 0–44)
AST: 22 U/L (ref 15–41)
Albumin: 3.2 g/dL — ABNORMAL LOW (ref 3.5–5.0)
Alkaline Phosphatase: 119 U/L (ref 38–126)
Anion gap: 8 (ref 5–15)
BUN: 15 mg/dL (ref 8–23)
CO2: 21 mmol/L — ABNORMAL LOW (ref 22–32)
Calcium: 9.3 mg/dL (ref 8.9–10.3)
Chloride: 109 mmol/L (ref 98–111)
Creatinine, Ser: 0.9 mg/dL (ref 0.44–1.00)
GFR, Estimated: >60 mL/min (ref >60)
Glucose, Bld: 89 mg/dL (ref 70–99)
Potassium: 4 mmol/L (ref 3.5–5.1)
Sodium: 138 mmol/L (ref 135–145)
Total Bilirubin: 0.2 mg/dL — ABNORMAL LOW (ref 0.3–1.2)
Total Protein: 6.4 g/dL — ABNORMAL LOW (ref 6.5–8.1)

## 2022-11-08 LAB — BRAIN NATRIURETIC PEPTIDE: B Natriuretic Peptide: 1046.6 pg/mL — ABNORMAL HIGH (ref 0.0–100.0)

## 2022-11-08 MED ORDER — FUROSEMIDE 40 MG PO TABS: 40.0000 mg | ORAL_TABLET | Freq: Every day | ORAL | 2 refills | Status: DC | PRN

## 2022-11-08 MED ORDER — POTASSIUM CHLORIDE CRYS ER 20 MEQ PO TBCR: 20.0000 meq | EXTENDED_RELEASE_TABLET | Freq: Every day | ORAL | 2 refills | Status: DC | PRN

## 2022-11-08 MED ORDER — CARVEDILOL 12.5 MG PO TABS: 6.2500 mg | ORAL_TABLET | Freq: Two times a day (BID) | ORAL | 1 refills | Status: DC

## 2022-11-08 NOTE — Progress Notes (Signed)
Provided patient with following samples:' [name] 10 mg tablets, # 21 [id number]; exp 3/26

## 2022-11-08 NOTE — Telephone Encounter (Signed)
Advanced Heart Failure Patient Advocate Encounter  The patient was approved for a [employer] grant that will help cover the cost of [name].  Total amount awarded, $10,000.  Effective: 10/10/23 - 10/09/23.  BIN [id number] PCN [id number] Group [id number] ID [id number]  Added to [facility]. Patient provided with approval and processing information in office.  Clista Bernhardt, CPhT Rx Patient Advocate Phone: [phone]

## 2022-11-08 NOTE — Progress Notes (Addendum)
Advanced Heart Failure Team Note    PCP: Dr. Caryl Bis Primary HF Cardiologist: Dr. Haroldine Laws  HPI:  72 y.o. female with a hx of CAD s/p anterior STEMI 5/17 with PCI/DES x 2 to LAD, HFrEF/ischemic cardiomyopathy with LVEF at 20-25%, DM2, HTN, HLD, LBBB with CRT-D placement 01/02/22, OSA, and severe mitral regurgitation s/p mTEER with MitraClip 10/04/22.    She underwent successful transcatheter edge-to-edge mitral valve repair 10/04/22 with MitraClip NTW x1 and NT x1 reducing baseline 4+ mitral regurgitation to trace.    Post op echocardiogram 10/05/22. EF 25% with moderate to severe MR felt secondary to SLDA of the second MitraClip placement. Mean gradient at 12mHg with an average HR at 83bpm. RV normal    [facility] severe episodes of dyspnea with exertion. [facility] [place] [facility] [place] [facility] [place].  RHC showed well preserved hemodynamics despite severe MR/mod MS. TCTS consulted and felt to be a candidate for MVR. HF medications adjusted. Discharged on bb, arni, and mra.    Patient underwent MVR with 29 mm Mosaic valve by Dr. WLavonna Monarchon 10/24/22. She returned to the OR shortly after for control of large vessel bleeder on right inferior pericardial. Moderate pericardial and pleural effusions were evacuated. She did well post-op and was stable for discharge 10/30/22 after diuresis and titration of GDMT.   She saw TCTS for f/u yesterday. Appeared to be progressing well but was reporting some dizziness that was thought to be d/t volume depletion. Lasix switched to PRN.  She is here today for hospital follow-up.  Overall, feels she is doing well post-op. Has some dyspnea with exertion but states symptoms are much better than pre-op.  She is planning to start in-home PT in the next 1-2 days. Feels weak and deconditioned. No orthopnea, PND or lower extremity edema. Her home weight has been fluctuating between 128-131 lb. Main complaint is orthostatic dizziness.  Taking all meds as prescribed.  Lives at home with her husband who has dementia. Her sister has been staying with her while she recovers from surgery.  Orthostatics + today.  ROS: All systems negative except as listed in HPI, PMH and Problem List.  SH:  Social History   Socioeconomic History   Marital status: Married    Spouse name: AAnnahi Short  Number of children: 0   Years of education: BS in education   Highest education level: Not on file  Occupational History   Occupation:  Retired SEducation officer, museum Tobacco Use   Smoking status: Former    Types: Cigarettes    Quit date: 04/13/1997    Years since quitting: 25.5   Smokeless tobacco: Never  Vaping Use   Vaping Use: Never used  Substance and Sexual Activity   Alcohol use: Not Currently   Drug use: No   Sexual activity: Not Currently  Other Topics Concern   Not on file  Social History Narrative   [place]  [place] children.   [place]   Left-handed   Lives in a two story home       Social Determinants of Health   Financial Resource Strain: Low Risk  (03/30/2022)   Overall Financial Resource Strain (CARDIA)    Difficulty of Paying Living Expenses: Not hard at all  Food Insecurity: No Food Insecurity (10/17/2022)   Hunger Vital Sign  Worried About Charity fundraiser in the Last Year: Never true    Christine in the Last Year: Never true  Transportation Needs: No Transportation Needs (10/17/2022)   PRAPARE - Hydrologist (Medical): No    Lack of Transportation (Non-Medical): No  Physical Activity: Not on file  Stress: No Stress Concern Present (03/30/2022)   [place]    Feeling of Stress : Not at all  Social Connections: Unknown (03/30/2022)   Social Connection and Isolation Panel  [NHANES]    Frequency of Communication with Friends and Family: Not on file    Frequency of Social Gatherings with Friends and Family: Not on file    Attends Religious Services: Not on file    Active Member of Clubs or Organizations: Not on file    Attends Archivist Meetings: Not on file    Marital Status: Married  Intimate Partner Violence: Not At Risk (10/15/2022)   Humiliation, Afraid, Rape, and Kick questionnaire    Fear of Current or Ex-Partner: No    Emotionally Abused: No    Physically Abused: No    Sexually Abused: No    FH:  Family History  Problem Relation Age of Onset   Anxiety disorder Mother    Paranoid behavior Mother    Hypertension Mother    Dementia Mother    High Cholesterol Mother    Diabetes Mother    Hyperlipidemia Mother    Depression Mother    Hypertension Father    High Cholesterol Father    Mood Disorder Sister    Stroke Sister    Anxiety disorder Maternal Aunt    Tuberculosis Paternal Grandfather    Drug abuse Cousin     Past Medical History:  Diagnosis Date   AICD (automatic cardioverter/defibrillator) present    Anemia    Anxiety    Back pain    CAD (coronary artery disease) 04/11/2016   S/p ant STEMI 5/17: LHC >> LAD proximal 80%, mid 80%, distal 50%, ostial D1 60%; LCx with LPDA lesion 30%; RCA Mild calcification with no significant stenosis in a medium caliber, nondominant RCA; LVEF is estimated at 45% with inferoapical and lateral wall akinesis >> PCI: PCI: 3.5 x 24 mm Promus DES to prox LAD, 2.5 x 12 mm Promus DES to mid LAD. // Myoview 7/21: EF 20, no ischemia; high risk    Chest pain    Chronic systolic CHF (congestive heart failure) ([place]) 03/21/2016   Echo 01/30/17: Diff HK, mild focal basal septal hypertrophy, EF 30-35, mild AI, MAC, mild MR // Echo 06/08/16: Mild focal basal septal hypertrophy, EF 25-30%, diff HK, ant-septal AK, Gr 1 DD, mild AI, MAC, mild MR, PASP 37 mmHg // Echo 03/18/16: EF 30-35%, ant-septal AK, Gr 1 DD,  mild MR, severe LAE.   Constipation    COVID    February 2022 and Summer 2023 both mild cases   Depression    Dizziness    Dyspnea    Glaucoma    Gout    Heart disease    Heartburn    History of acute anterior wall MI 03/17/2016   History of heart attack    Hyperlipemia    Hypertension    Ischemic cardiomyopathy 10/02/2016   Refused ICD   Joint pain    Left bundle branch block    Myocardial infarction ([place])    2017   Nausea  Positive colorectal cancer screening using Cologuard test 03/01/2020   Pre-diabetes    S/P mitral valve clip implantation 10/04/2022   MitraClip NTWx1 and NTx1 with Dr. Burt Knack and Dr. Ali Lowe   Sleep apnea    SOB (shortness of breath)    Stroke [facility])    2021   Suicidal ideation 08/27/2018   Swelling    feet or legs    Current Outpatient Medications  Medication Sig Dispense Refill   aspirin EC 81 MG tablet Take 1 tablet (81 mg total) by mouth daily. Swallow whole. 30 tablet 12   atorvastatin (LIPITOR) 80 MG tablet Take 80 mg by mouth daily.     dorzolamide-timolol (COSOPT) 22.3-6.8 MG/ML ophthalmic solution Place 1 drop into both eyes 2 (two) times daily.      empagliflozin ([name]) 10 MG TABS tablet Take 1 tablet (10 mg total) by mouth daily. 30 tablet 1   ferrous sulfate 325 (65 FE) MG tablet Take 1 tablet (325 mg total) by mouth daily. 30 tablet 0   irbesartan (AVAPRO) 75 MG tablet Take 1/2 tablet (37.5 mg total) by mouth daily. 30 tablet 1   latanoprost (XALATAN) 0.005 % ophthalmic solution Place 1 drop into both eyes at bedtime.     lumateperone tosylate (CAPLYTA) 42 MG capsule Take 42 mg by mouth at bedtime.     oxyCODONE (OXY IR/ROXICODONE) 5 MG immediate release tablet Take 1 tablet (5 mg total) by mouth every 6 (six) hours as needed for severe pain. 28 tablet 0   spironolactone (ALDACTONE) 25 MG tablet Take 12.5 mg by mouth daily.     traZODone (DESYREL) 150 MG tablet Take 150 mg by mouth at bedtime.     venlafaxine XR (EFFEXOR-XR)  150 MG 24 hr capsule TAKE 2 CAPSULES BY MOUTH ONCE DAILY (NEEDS to follow-up with psychiatry for further refills) (Patient taking differently: Take 300 mg by mouth daily.) 60 capsule 0   warfarin (COUMADIN) 3 MG tablet Take 1 tablet (3 mg total) by mouth daily. 30 tablet 1   carvedilol (COREG) 12.5 MG tablet Take 0.5 tablets (6.25 mg total) by mouth 2 (two) times daily with a meal. 60 tablet 1   furosemide (LASIX) 40 MG tablet Take 1 tablet (40 mg total) by mouth daily as needed. 30 tablet 2   potassium chloride SA (KLOR-CON M) 20 MEQ tablet Take 1 tablet (20 mEq total) by mouth daily as needed. 30 tablet 2   No current facility-administered medications for this encounter.    Vitals:   11/08/22 1033  SpO2: 100%  Weight: 60.7 kg (133 lb 12.8 oz)   HF 83 bpm  Orthostatic BP Supine 130/60 Sitting 140/70 Standing 110/80    PHYSICAL EXAM:  General:  Well appearing. Ambulated into clinic. HEENT: normal Neck: supple. JVP flat. Carotids 2+ bilaterally; no bruits.  Cor: PMI normal. Regular rate & rhythm. No rubs, gallops or murmurs. Sternum stable. No dehiscence. Lungs: clear Abdomen: soft, nontender, nondistended.  Extremities: no cyanosis, clubbing, rash, edema Neuro: alert & orientedx3, cranial nerves grossly intact. Moves all 4 extremities w/o difficulty. Affect pleasant.   ECG: AV/Bi-V paced, 83 bpm  Unable to interrogate [employer] ICD in clinic.   ASSESSMENT & PLAN:    1. Mitral regurgitation, severe - s/p mTEER with 2 clips on 10/04/22 - Post op echo EF 25% with moderate to severe MR felt secondary to SLDA of the second MitraClip placement. - S/p MVR with 29 mm Mosaic valve by Dr. Lavonna Monarch. Returned to OR  for control of bleeding.  - Progressing well at f/u w/ TCTS yesterday.  - Echo scheduled later this month - CXR yesterday with no effusions - On Warfarin   2. Chronic systolic HF - initially felt due to ischemic CM. S/p Anterior STEMI 5/17. Now suspect MR major  contributing factor. Has LBBB. EF did not improve despite CRT-D - Echo (8/19): EF 30-35%. - cMRI 1/23 EF 15% No LGE. RV ok. Severe LV dyssynchrony Moderate MR  - CRT-D placed 01/02/22 with LBBB lead by Dr. Quentin Ore - Echo 07/17/22 EF 20-25% G2DD, RV ok. Severe central MR  - s/p mTEER with 2 clips on 10/04/22 - Echo 10/05/22: EF 25% with moderate to severe MR felt secondary to SLDA of the second MitraClip placement.  - RHC 12/23: RA 4, PA 47/20 (33) PCWP 13 CO 5.2 CI 3.1  PVR 2.1  - S/p MVR 10/24/22 - Has f/u echo scheduled - NYHA early III, confounded by weakness/deconditioning - Appears euvolemic, even slightly on dry side. Agree with decreasing Furosemide to PRN, especially with orthostasis. - Cut back coreg to 6.25 mg BID - Continue spiro 12.5 mg daily - Continue avapro 37.5 mg daily - Continue [name] 10 mg daily - Recommended she call the clinic if dizziness does not improve over next few days, will need to cut back meds further. - CMET, BNP, CBC today   3. Coronary artery disease involving native coronary artery of native heart without angina pectoris - Hx of anterior STEMI in 5/17 tx with DES x 2 to the LAD.   - No LGE on cMRI (1/23) - LHC 9/23 patent LAD stents - On aspirin + statin - No angina   4. LBBB - s/p CRT-D 3/23 with Dr. Quentin Ore  - EF has not improved   5. Orthostatic hypotension - Cut back diuretic and coreg as above - Labs today - Wear compression stockings - Starts PT soon - She will call with ongoing symptoms   F/u: 01/24 with Dr. Haroldine Laws as scheduled

## 2022-11-08 NOTE — Addendum Note (Signed)
Encounter addended by: Lezlie Octave, RN on: 11/08/2022 11:46 AM  Actions taken: Clinical Note Signed

## 2022-11-08 NOTE — Patient Instructions (Addendum)
EKG done today.  Labs done today. We will contact you only if your labs are abnormal.  DECREASE Lasix to '40mg'$  (1 tablet) by mouth daily as needed.  DECREASE Potassium to 28mq(1 tablet) by mouth daily as needed. (Only when you take the Lasix)  DECREASE Carvedilol to 6.25 (1/2 tablet) by mouth 2 times daily.  No other medication changes were made. Please continue all current medications as prescribed.  Your physician recommends that you keep your scheduled follow-up appointment with Dr. BHaroldine Laws  If you have any questions or concerns before your next appointment please send [place] message through [place] call our office at [phone]    TO LEAVE A MESSAGE FOR THE NURSE SELECT OPTION 2, PLEASE LEAVE A MESSAGE INCLUDING: YOUR NAME DATE OF BIRTH CALL BACK NUMBER REASON FOR CALL**this is important as we prioritize the call backs  YOU WILL RECEIVE A CALL BACK THE SAME DAY AS LONG AS YOU CALL BEFORE 4:00 PM   Do the following things EVERYDAY: Weigh yourself in the morning before breakfast. Write it down and keep it in a log. Take your medicines as prescribed Eat low salt foods--Limit salt (sodium) to 2000 mg per day.  Stay as active as you can everyday Limit all fluids for the day to less than 2 liters   At the [facility] you and your health needs are our priority. As part of our continuing mission to provide you with exceptional heart care, we have created designated Provider Care Teams. These Care Teams include your primary Cardiologist (physician) and Advanced Practice Providers (APPs- Physician Assistants and Nurse Practitioners) who all work together to provide you with the care you need, when you need it.   You may see any of the following providers on your designated Care Team at your next follow up: Dr DGlori BickersDr DHaynes Kerns NP BLyda Jester PUtahLAudry Riles PharmD   Please be sure to bring in all your medications bottles  to every appointment.

## 2022-11-09 NOTE — Addendum Note (Signed)
Encounter addended by: Joette Catching, PA-C on: 11/09/2022 7:28 AM  Actions taken: Clinical Note Signed

## 2022-11-13 ENCOUNTER — Telehealth: Payer: Self-pay

## 2022-11-13 ENCOUNTER — Telehealth: Payer: Self-pay | Admitting: Family Medicine

## 2022-11-13 ENCOUNTER — Ambulatory Visit (INDEPENDENT_AMBULATORY_CARE_PROVIDER_SITE_OTHER): Payer: Medicare Other | Admitting: Cardiovascular Disease

## 2022-11-13 DIAGNOSIS — Z5181 Encounter for therapeutic drug level monitoring: Secondary | ICD-10-CM

## 2022-11-13 DIAGNOSIS — I340 Nonrheumatic mitral (valve) insufficiency: Secondary | ICD-10-CM | POA: Diagnosis not present

## 2022-11-13 DIAGNOSIS — Z9889 Other specified postprocedural states: Secondary | ICD-10-CM | POA: Diagnosis not present

## 2022-11-13 DIAGNOSIS — I5023 Acute on chronic systolic (congestive) heart failure: Secondary | ICD-10-CM | POA: Diagnosis not present

## 2022-11-13 DIAGNOSIS — I639 Cerebral infarction, unspecified: Secondary | ICD-10-CM

## 2022-11-13 DIAGNOSIS — I447 Left bundle-branch block, unspecified: Secondary | ICD-10-CM | POA: Diagnosis not present

## 2022-11-13 DIAGNOSIS — Z952 Presence of prosthetic heart valve: Secondary | ICD-10-CM

## 2022-11-13 DIAGNOSIS — Z48812 Encounter for surgical aftercare following surgery on the circulatory system: Secondary | ICD-10-CM | POA: Diagnosis not present

## 2022-11-13 DIAGNOSIS — R413 Other amnesia: Secondary | ICD-10-CM

## 2022-11-13 DIAGNOSIS — I110 Hypertensive heart disease with heart failure: Secondary | ICD-10-CM | POA: Diagnosis not present

## 2022-11-13 LAB — POCT INR: INR: 1.3 — AB (ref 2.0–3.0)

## 2022-11-13 NOTE — Telephone Encounter (Signed)
Please let the patient know that the neurology office we tried to refer her to contacted me and let me know that they felt it would be best to have her evaluated at a academic center given that she has been evaluated by 2 community neurologists already.  The options would be to refer her to [facility], or [facility] or have her follow-up with one of the neurologist she has already seen.  Please see what she would like to do.

## 2022-11-13 NOTE — Telephone Encounter (Signed)
Melanie Ray with [facility] contacted the office [phone] stating that patient's weight this morning was 133 lbs. She states that her parameters for weight was 133 lbs. States that patient has not taken her lasix this morning. Weight has been fluctuating from 131-132. Advised she should contact patient's heart failure physician for further advise. She acknowledged receipt.

## 2022-11-14 NOTE — Telephone Encounter (Signed)
I called and spoke with the patient and she stated she would go back to a neurologist she saw previously and she also stated that her husband would like a referral to the same one as her as well for his neuropathy.  Kierstynn Babich,cma

## 2022-11-14 NOTE — Telephone Encounter (Signed)
Pt called in staying the status of a referral for a neurologist. I read the note below, she said don't want to go to either [facility] or [facility]. She would like a call back regarding this matter.

## 2022-11-14 NOTE — Telephone Encounter (Signed)
Please call her and relay my message. The options at this point are to go see one of the neurologists that she has seen previously or go to one of the local academic centers. The neurology group I referred her to most recently declined the referral. We can get her back in with one of neurologists she has already seen.

## 2022-11-15 DIAGNOSIS — Z48812 Encounter for surgical aftercare following surgery on the circulatory system: Secondary | ICD-10-CM | POA: Diagnosis not present

## 2022-11-15 DIAGNOSIS — I447 Left bundle-branch block, unspecified: Secondary | ICD-10-CM | POA: Diagnosis not present

## 2022-11-15 DIAGNOSIS — I340 Nonrheumatic mitral (valve) insufficiency: Secondary | ICD-10-CM | POA: Diagnosis not present

## 2022-11-15 DIAGNOSIS — I5023 Acute on chronic systolic (congestive) heart failure: Secondary | ICD-10-CM | POA: Diagnosis not present

## 2022-11-15 DIAGNOSIS — Z9889 Other specified postprocedural states: Secondary | ICD-10-CM | POA: Diagnosis not present

## 2022-11-15 DIAGNOSIS — F251 Schizoaffective disorder, depressive type: Secondary | ICD-10-CM | POA: Diagnosis not present

## 2022-11-15 DIAGNOSIS — I110 Hypertensive heart disease with heart failure: Secondary | ICD-10-CM | POA: Diagnosis not present

## 2022-11-15 DIAGNOSIS — Z79899 Other long term (current) drug therapy: Secondary | ICD-10-CM | POA: Diagnosis not present

## 2022-11-16 DIAGNOSIS — Z79899 Other long term (current) drug therapy: Secondary | ICD-10-CM | POA: Diagnosis not present

## 2022-11-16 DIAGNOSIS — F251 Schizoaffective disorder, depressive type: Secondary | ICD-10-CM | POA: Diagnosis not present

## 2022-11-16 NOTE — Addendum Note (Signed)
Addended by: Caryl Bis, Shacara Cozine G on: 11/16/2022 12:40 PM   Modules accepted: Orders

## 2022-11-16 NOTE — Telephone Encounter (Signed)
I placed the referral for her, though given that she has seen neurology (Dr Manuella Ghazi) in the past year she should be able to call his office to set up an appointment. Her husband also follows with him already and they can call to set up his appointment as well.

## 2022-11-19 ENCOUNTER — Telehealth (HOSPITAL_COMMUNITY): Payer: Self-pay | Admitting: *Deleted

## 2022-11-19 NOTE — Telephone Encounter (Signed)
Pt left vm asking if it is ok for her to drive.   Routed to [place]

## 2022-11-19 NOTE — Telephone Encounter (Signed)
Pt aware.

## 2022-11-20 DIAGNOSIS — I340 Nonrheumatic mitral (valve) insufficiency: Secondary | ICD-10-CM | POA: Diagnosis not present

## 2022-11-20 DIAGNOSIS — I110 Hypertensive heart disease with heart failure: Secondary | ICD-10-CM | POA: Diagnosis not present

## 2022-11-20 DIAGNOSIS — I5023 Acute on chronic systolic (congestive) heart failure: Secondary | ICD-10-CM | POA: Diagnosis not present

## 2022-11-20 DIAGNOSIS — Z9889 Other specified postprocedural states: Secondary | ICD-10-CM | POA: Diagnosis not present

## 2022-11-20 DIAGNOSIS — Z48812 Encounter for surgical aftercare following surgery on the circulatory system: Secondary | ICD-10-CM | POA: Diagnosis not present

## 2022-11-20 DIAGNOSIS — I447 Left bundle-branch block, unspecified: Secondary | ICD-10-CM | POA: Diagnosis not present

## 2022-11-21 ENCOUNTER — Ambulatory Visit: Payer: Medicare Other | Attending: Cardiovascular Disease | Admitting: *Deleted

## 2022-11-21 DIAGNOSIS — Z5181 Encounter for therapeutic drug level monitoring: Secondary | ICD-10-CM

## 2022-11-21 DIAGNOSIS — Z952 Presence of prosthetic heart valve: Secondary | ICD-10-CM

## 2022-11-21 DIAGNOSIS — I639 Cerebral infarction, unspecified: Secondary | ICD-10-CM

## 2022-11-21 LAB — POCT INR: INR: 1.2 — AB (ref 2.0–3.0)

## 2022-11-21 MED ORDER — WARFARIN SODIUM 3 MG PO TABS: ORAL_TABLET | ORAL | 1 refills | Status: DC

## 2022-11-21 NOTE — Patient Instructions (Signed)
Description   Today take 2 tablets then INCREASE TO 1.5 tablets daily except for 1 tablet on Tuesday, Thursday, and Saturday. Recheck INR in 1 week. Be consistent with 3-4 serving of greens per week. [facility] [phone]

## 2022-11-22 NOTE — Telephone Encounter (Signed)
Pt called stating no one has called her for her referral yet

## 2022-11-23 ENCOUNTER — Telehealth: Payer: Self-pay | Admitting: Cardiovascular Disease

## 2022-11-23 NOTE — Telephone Encounter (Signed)
I called and spoke with the patient and informed her that she could see Dr. Manuella Ghazi and she stated she did not want to see him but she received a notice from another doctor and I informed her that she can call the number form the notice and schedule herself and she stated she would call and set up and appointment for her and her husband.  Melanie Ray,cma

## 2022-11-23 NOTE — Telephone Encounter (Signed)
[facility] called to talk with Dr. Burt Knack in regards to order #[id number]. Call [phone] and ask for [facility]

## 2022-11-26 ENCOUNTER — Ambulatory Visit (INDEPENDENT_AMBULATORY_CARE_PROVIDER_SITE_OTHER): Payer: Self-pay | Admitting: Thoracic Surgery (Cardiothoracic Vascular Surgery)

## 2022-11-26 ENCOUNTER — Other Ambulatory Visit: Payer: Self-pay | Admitting: *Deleted

## 2022-11-26 VITALS — BP 116/71 | HR 95 | Resp 20 | Ht 63.0 in | Wt 135.0 lb

## 2022-11-26 DIAGNOSIS — F251 Schizoaffective disorder, depressive type: Secondary | ICD-10-CM | POA: Diagnosis not present

## 2022-11-26 DIAGNOSIS — Z952 Presence of prosthetic heart valve: Secondary | ICD-10-CM

## 2022-11-26 DIAGNOSIS — Z79899 Other long term (current) drug therapy: Secondary | ICD-10-CM | POA: Diagnosis not present

## 2022-11-26 LAB — ECHO INTRAOPERATIVE TEE
AR max vel: 2.01 cm2
AV Area VTI: 2.06 cm2
AV Area mean vel: 2.25 cm2
AV Mean grad: 5 mmHg
AV Peak grad: 10.1 mmHg
AV Vena cont: 0.2 cm
Ao pk vel: 1.59 m/s
MV VTI: 1.73 cm2
MV Vena cont: 0.9 cm
P 1/2 time: 629 msec

## 2022-11-26 MED ORDER — EMPAGLIFLOZIN 10 MG PO TABS: 10.0000 mg | ORAL_TABLET | Freq: Every day | ORAL | 1 refills | Status: DC

## 2022-11-26 NOTE — Progress Notes (Signed)
Per Dr. Lavonna Monarch, amb referral to [facility] Cardiac Re[facility] program placed.

## 2022-11-26 NOTE — Patient Instructions (Signed)
Follow up with Heart failure team Cardiac rehab

## 2022-11-26 NOTE — Progress Notes (Signed)
[address]       [place] [address]             [phone]           Melanie Ray [place] Medical Record #[id number] Date of Birth: 07-26-1951  Sherren Mocha, MD Leone Haven, MD  Chief Complaint:   SP mitral valve replacement 10/24/22  History of Present Illness:     Pt complains of dizziness and some SOB. She says SOB about the same as before. She has had lasix go to prn and has not improved dizziness. She does say that she is quite anxious and may be contributing. She has no lower ext edema. BP in office ok      Past Medical History:  Diagnosis Date   AICD (automatic cardioverter/defibrillator) present    Anemia    Anxiety    Back pain    CAD (coronary artery disease) 04/11/2016   S/p ant STEMI 5/17: LHC >> LAD proximal 80%, mid 80%, distal 50%, ostial D1 60%; LCx with LPDA lesion 30%; RCA Mild calcification with no significant stenosis in a medium caliber, nondominant RCA; LVEF is estimated at 45% with inferoapical and lateral wall akinesis >> PCI: PCI: 3.5 x 24 mm Promus DES to prox LAD, 2.5 x 12 mm Promus DES to mid LAD. // Myoview 7/21: EF 20, no ischemia; high risk    Chest pain    Chronic systolic CHF (congestive heart failure) ([place]) 03/21/2016   Echo 01/30/17: Diff HK, mild focal basal septal hypertrophy, EF 30-35, mild AI, MAC, mild MR // Echo 06/08/16: Mild focal basal septal hypertrophy, EF 25-30%, diff HK, ant-septal AK, Gr 1 DD, mild AI, MAC, mild MR, PASP 37 mmHg // Echo 03/18/16: EF 30-35%, ant-septal AK, Gr 1 DD, mild MR, severe LAE.   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 Burt Knack and Dr. Ali Lowe   Sleep apnea    SOB (shortness of breath)    Stroke ([place])    2021   Suicidal ideation 08/27/2018   Swelling    feet or legs    Past Surgical History:  Procedure Laterality Date   APPENDECTOMY     BIV ICD INSERTION CRT-D N/A 01/02/2022   Procedure: BIV ICD INSERTION CRT-D;  Surgeon: Vickie Epley, MD;  Location: [facility];  Service: Cardiovascular;  Laterality: N/A;   CARDIAC CATHETERIZATION N/A 03/17/2016   Procedure: Left Heart Cath and Coronary Angiography;  Surgeon: Sherren Mocha, MD;  Location: [facility];  Service: Cardiovascular;  Laterality: N/A;   CARDIAC CATHETERIZATION N/A 03/17/2016   Procedure: Coronary Stent Intervention;  Surgeon: Sherren Mocha, MD;  Location: [facility];  Service: Cardiovascular;  Laterality: N/A;  COLONOSCOPY WITH PROPOFOL N/A 08/13/2017   Procedure: COLONOSCOPY WITH PROPOFOL;  Surgeon: Jonathon Bellows, MD;  Location: [facility] ENDOSCOPY;  Service: Gastroenterology;  Laterality: N/A;   COLONOSCOPY WITH PROPOFOL N/A 03/29/2020   Procedure: COLONOSCOPY WITH PROPOFOL;  Surgeon: Lucilla Lame, MD;  Location: [facility] ENDOSCOPY;  Service: Endoscopy;  Laterality: N/A;   EXPLORATION POST OPERATIVE OPEN HEART N/A 10/24/2022   Procedure: EXPLORATION POST OPERATIVE OPEN HEART;  Surgeon: Coralie Common, MD;  Location: [facility];  Service: Open Heart Surgery;  Laterality: N/A;   MITRAL VALVE REPAIR N/A 10/04/2022   Procedure: MITRAL VALVE REPAIR;  Surgeon: Sherren Mocha, MD;  Location: [place] [facility];  Service: Cardiovascular;  Laterality: N/A;   MITRAL VALVE REPLACEMENT N/A 10/24/2022   Procedure: MITRAL VALVE (MV) REPLACEMENT USING A 29 MM MEDTRONIC MOSAIC 310;  Surgeon: Coralie Common, MD;  Location: [facility];  Service:  Open Heart Surgery;  Laterality: N/A;   RIGHT HEART CATH N/A 10/15/2022   Procedure: RIGHT HEART CATH;  Surgeon: Sherren Mocha, MD;  Location: [place] [facility];  Service: Cardiovascular;  Laterality: N/A;   RIGHT/LEFT HEART CATH AND CORONARY ANGIOGRAPHY N/A 08/02/2022   Procedure: RIGHT/LEFT HEART CATH AND CORONARY ANGIOGRAPHY;  Surgeon: Jolaine Artist, MD;  Location: [facility];  Service: Cardiovascular;  Laterality: N/A;   SMALL BOWEL REPAIR     TEE WITHOUT CARDIOVERSION N/A 08/02/2022   Procedure: TRANSESOPHAGEAL ECHOCARDIOGRAM (TEE);  Surgeon: Jolaine Artist, MD;  Location: [facility] ENDOSCOPY;  Service: Cardiovascular;  Laterality: N/A;   TEE WITHOUT CARDIOVERSION N/A 10/04/2022   Procedure: TRANSESOPHAGEAL ECHOCARDIOGRAM (TEE);  Surgeon: Sherren Mocha, MD;  Location: [facility];  Service: Cardiovascular;  Laterality: N/A;   TEE WITHOUT CARDIOVERSION N/A 10/24/2022   Procedure: TRANSESOPHAGEAL ECHOCARDIOGRAM (TEE);  Surgeon: Coralie Common, MD;  Location: [facility];  Service: Open Heart Surgery;  Laterality: N/A;   TONSILLECTOMY     UTERINE FIBROID SURGERY      Social History   Tobacco Use  Smoking Status Former   Types: Cigarettes   Quit date: 04/13/1997   Years since quitting: 25.6  Smokeless Tobacco Never    Social History   Substance and Sexual Activity  Alcohol Use Not Currently    Social History   Socioeconomic History   Marital status: Married    Spouse name: Nataki Mccrumb   Number of children: 0   Years of education: BS in education   Highest education level: Not on file  Occupational History   Occupation:  Retired Education officer, museum  Tobacco Use   Smoking status: Former    Types: Cigarettes    Quit date: 04/13/1997    Years since quitting: 25.6   Smokeless tobacco: Never  Vaping Use   Vaping Use: Never used  Substance and Sexual Activity   Alcohol use: Not Currently   Drug use: No   Sexual activity: Not Currently  Other Topics Concern    Not on file  Social History Narrative   Lives in [place] with spouse.  No children.   Retired first [employer] for over 30 years ([place] for 10 years and then in [place] for over 20 years).   Left-handed   Lives in a two story home       Social Determinants of Health   Financial Resource Strain: Low Risk  (03/30/2022)   Overall Financial Resource Strain (CARDIA)    Difficulty of Paying Living Expenses: Not hard at all  Food Insecurity: No Food Insecurity (10/17/2022)  Hunger Vital Sign    Worried About Running Out of Food in the Last Year: Never true    Ran Out of Food in the Last Year: Never true  Transportation Needs: No Transportation Needs (10/17/2022)   PRAPARE - Hydrologist (Medical): No    Lack of Transportation (Non-Medical): No  Physical Activity: Not on file  Stress: No Stress Concern Present (03/30/2022)   [place]    Feeling of Stress : Not at all  Social Connections: Unknown (03/30/2022)   Social Connection and Isolation Panel [NHANES]    Frequency of Communication with Friends and Family: Not on file    Frequency of Social Gatherings with Friends and Family: Not on file    Attends Religious Services: Not on file    Active Member of [place] or Organizations: Not on file    Attends Archivist Meetings: Not on file    Marital Status: Married  Intimate Partner Violence: Not At Risk (10/15/2022)   Humiliation, Afraid, Rape, and Kick questionnaire    Fear of Current or Ex-Partner: No    Emotionally Abused: No    Physically Abused: No    Sexually Abused: No    Allergies  Allergen Reactions   Tetracycline Swelling   Meperidine Nausea And Vomiting    (Demerol)Nausea   Entresto [Sacubitril-Valsartan] Other (See Comments)    Shortness of Breath    Current Outpatient Medications  Medication Sig Dispense Refill   aspirin EC 81 MG tablet Take 1  tablet (81 mg total) by mouth daily. Swallow whole. 30 tablet 12   atorvastatin (LIPITOR) 80 MG tablet Take 80 mg by mouth daily.     carvedilol (COREG) 12.5 MG tablet Take 0.5 tablets (6.25 mg total) by mouth 2 (two) times daily with a meal. 60 tablet 1   dorzolamide-timolol (COSOPT) 22.3-6.8 MG/ML ophthalmic solution Place 1 drop into both eyes 2 (two) times daily.      empagliflozin ([name]) 10 MG TABS tablet Take 1 tablet (10 mg total) by mouth daily. 30 tablet 1   ferrous sulfate 325 (65 FE) MG tablet Take 1 tablet (325 mg total) by mouth daily. 30 tablet 0   furosemide (LASIX) 40 MG tablet Take 1 tablet (40 mg total) by mouth daily as needed. 30 tablet 2   irbesartan (AVAPRO) 75 MG tablet Take 1/2 tablet (37.5 mg total) by mouth daily. 30 tablet 1   latanoprost (XALATAN) 0.005 % ophthalmic solution Place 1 drop into both eyes at bedtime.     lumateperone tosylate (CAPLYTA) 42 MG capsule Take 42 mg by mouth at bedtime.     oxyCODONE (OXY IR/ROXICODONE) 5 MG immediate release tablet Take 1 tablet (5 mg total) by mouth every 6 (six) hours as needed for severe pain. 28 tablet 0   potassium chloride SA (KLOR-CON M) 20 MEQ tablet Take 1 tablet (20 mEq total) by mouth daily as needed. 30 tablet 2   spironolactone (ALDACTONE) 25 MG tablet Take 12.5 mg by mouth daily.     traZODone (DESYREL) 150 MG tablet Take 150 mg by mouth at bedtime.     venlafaxine XR (EFFEXOR-XR) 150 MG 24 hr capsule TAKE 2 CAPSULES BY MOUTH ONCE DAILY (NEEDS to follow-up with psychiatry for further refills) (Patient taking differently: Take 300 mg by mouth daily.) 60 capsule 0   warfarin (COUMADIN) 3 MG tablet Take 1.5 tablets daily by mouth except for 1 tablet on Tuesday, Thursday,  and Saturday or as directed by Anticoagulation Clinic. 45 tablet 1   No current facility-administered medications for this visit.     Family History  Problem Relation Age of Onset   Anxiety disorder Mother    Paranoid behavior Mother     Hypertension Mother    Dementia Mother    High Cholesterol Mother    Diabetes Mother    Hyperlipidemia Mother    Depression Mother    Hypertension Father    High Cholesterol Father    Mood Disorder Sister    Stroke Sister    Anxiety disorder Maternal Aunt    Tuberculosis Paternal Grandfather    Drug abuse Cousin        Physical Exam: Pt looks fantastic from appearance standpoint Lungs: clear Card: some extra systoles and no murmur Wound: well healed Ext: no edema     Diagnostic Studies & Laboratory data: I have personally reviewed the following studies and agree with the findings     Recent Radiology Findings:   No results found.    Recent Lab Findings: Lab Results  Component Value Date   WBC 6.0 11/08/2022   HGB 9.6 (L) 11/08/2022   HCT 31.4 (L) 11/08/2022   PLT 371 11/08/2022   GLUCOSE 89 11/08/2022   CHOL 139 12/19/2021   TRIG 71.0 12/19/2021   HDL 41.60 12/19/2021   LDLDIRECT 78.0 02/07/2018   LDLCALC 83 12/19/2021   ALT 23 11/08/2022   AST 22 11/08/2022   NA 138 11/08/2022   K 4.0 11/08/2022   CL 109 11/08/2022   CREATININE 0.90 11/08/2022   BUN 15 11/08/2022   CO2 21 (L) 11/08/2022   TSH 0.64 09/05/2021   INR 1.2 (A) 11/21/2022   HGBA1C 5.5 05/07/2022      Assessment / Plan:     SP mitral valve replacement following mitral clip placement with dislogement from posterior leaflet. Overall doing ok. Not sure etiology of dizziness. Does not seem blood pressure related. Now off diuretics. CXR from the other week looks fantastic. Will await HF visit and determine if need for more medication manipulation. Will expect an echo will be performed.  Restrictions reviewed. Pt driving. Will set up cardiac rehab   I have spent 20 min in review of the records, viewing studies and in face to face with patient and in coordination of future care    Coralie Common 11/26/2022 12:57 PM

## 2022-11-28 ENCOUNTER — Ambulatory Visit (HOSPITAL_COMMUNITY)
Admission: RE | Admit: 2022-11-28 | Discharge: 2022-11-28 | Disposition: A | Discharge status: Home / Self Care | Payer: Medicare Other | Source: Ambulatory Visit | Attending: Internal Medicine | Admitting: Internal Medicine

## 2022-11-28 ENCOUNTER — Ambulatory Visit (HOSPITAL_BASED_OUTPATIENT_CLINIC_OR_DEPARTMENT_OTHER)
Admission: RE | Admit: 2022-11-28 | Discharge: 2022-11-28 | Disposition: A | Discharge status: Home / Self Care | Payer: Medicare Other | Source: Ambulatory Visit | Attending: Cardiovascular Disease | Admitting: Cardiovascular Disease

## 2022-11-28 ENCOUNTER — Encounter (HOSPITAL_COMMUNITY): Payer: Self-pay | Admitting: Internal Medicine

## 2022-11-28 ENCOUNTER — Ambulatory Visit (INDEPENDENT_AMBULATORY_CARE_PROVIDER_SITE_OTHER): Payer: Medicare Other

## 2022-11-28 VITALS — BP 122/70 | HR 88 | Wt 136.0 lb

## 2022-11-28 DIAGNOSIS — Z952 Presence of prosthetic heart valve: Secondary | ICD-10-CM

## 2022-11-28 DIAGNOSIS — I5022 Chronic systolic (congestive) heart failure: Secondary | ICD-10-CM | POA: Diagnosis not present

## 2022-11-28 DIAGNOSIS — I110 Hypertensive heart disease with heart failure: Secondary | ICD-10-CM | POA: Diagnosis not present

## 2022-11-28 DIAGNOSIS — I2510 Atherosclerotic heart disease of native coronary artery without angina pectoris: Secondary | ICD-10-CM | POA: Diagnosis not present

## 2022-11-28 DIAGNOSIS — I639 Cerebral infarction, unspecified: Secondary | ICD-10-CM

## 2022-11-28 DIAGNOSIS — I447 Left bundle-branch block, unspecified: Secondary | ICD-10-CM | POA: Insufficient documentation

## 2022-11-28 DIAGNOSIS — I342 Nonrheumatic mitral (valve) stenosis: Secondary | ICD-10-CM | POA: Diagnosis not present

## 2022-11-28 DIAGNOSIS — E119 Type 2 diabetes mellitus without complications: Secondary | ICD-10-CM | POA: Diagnosis not present

## 2022-11-28 DIAGNOSIS — F17200 Nicotine dependence, unspecified, uncomplicated: Secondary | ICD-10-CM | POA: Insufficient documentation

## 2022-11-28 DIAGNOSIS — I255 Ischemic cardiomyopathy: Secondary | ICD-10-CM | POA: Diagnosis not present

## 2022-11-28 LAB — ECHOCARDIOGRAM COMPLETE
AR max vel: 2.21 cm2
AV Area VTI: 2.13 cm2
AV Area mean vel: 2.15 cm2
AV Mean grad: 4 mmHg
AV Peak grad: 7.6 mmHg
Ao pk vel: 1.38 m/s
Area-P 1/2: 3.76 cm2
Calc EF: 37.7 %
MV VTI: 1.23 cm2
S' Lateral: 5.4 cm
Single Plane A2C EF: 36.9 %
Single Plane A4C EF: 35.1 %
Weight: 2176 oz

## 2022-11-28 LAB — POCT INR: INR: 1.2 — AB (ref 2.0–3.0)

## 2022-11-28 NOTE — Patient Instructions (Addendum)
Description   Today take 2 tablets then INCREASE TO 1.5 tablets daily.  Recheck INR in 1 week.  Be consistent with 2 serving of greens per week.  Coumadin Clinic [phone]

## 2022-11-28 NOTE — Telephone Encounter (Signed)
Received fax for MD signature on home health INR checks. Md signed and was faxed back 11/27/22.

## 2022-11-28 NOTE — Patient Instructions (Signed)
There has been no change to your medications.  Your physician recommends that you schedule a follow-up appointment in: 3 month ( April 2024)  ** please call the office in February to arrange your follow up appointment.**  If you have any questions or concerns before your next appointment please send [name] a message through [place] or call our office at [phone].    TO LEAVE A MESSAGE FOR THE NURSE SELECT OPTION 2, PLEASE LEAVE A MESSAGE INCLUDING: YOUR NAME DATE OF BIRTH CALL BACK NUMBER REASON FOR CALL**this is important as we prioritize the call backs  YOU WILL RECEIVE A CALL BACK THE SAME DAY AS LONG AS YOU CALL BEFORE 4:00 PM  At the [facility], you and your health needs are our priority. As part of our continuing mission to provide you with exceptional heart care, we have created designated Provider Care Teams. These Care Teams include your primary Cardiologist (physician) and Advanced Practice Providers (APPs- Physician Assistants and Nurse Practitioners) who all work together to provide you with the care you need, when you need it.   You may see any of the following providers on your designated Care Team at your next follow up: Dr Glori Bickers Dr Loralie Champagne Dr. Roxana Hires, NP Lyda Jester, [place] [facility] [place], [place] Forestine Na, NP Audry Riles, PharmD   Please be sure to bring in all your medications bottles to every appointment.

## 2022-11-28 NOTE — Progress Notes (Signed)
Advanced Heart Failure Team Note    PCP: Dr. Caryl Bis Primary HF Cardiologist: Dr. Haroldine Laws  HPI:  72 y.o. female with a hx of CAD s/p anterior STEMI 5/17 with PCI/DES x 2 to LAD, HFrEF/ischemic cardiomyopathy with LVEF at 20-25%, DM2, HTN, HLD, LBBB with CRT-D placement 01/02/22, OSA, and severe mitral regurgitation s/p mTEER with MitraClip 10/04/22.    She underwent successful transcatheter edge-to-edge mitral valve repair 10/04/22 with MitraClip NTW x1 and NT x1 reducing baseline 4+ mitral regurgitation to trace. However. post op echocardiogram 10/05/22. EF 25% with moderate to severe MR felt secondary to SLDA of the second MitraClip placement. Mean gradient at 79mHg with an average HR at 83bpm. RV normal    TCTS consulted and felt to be a candidate for MVR.   Patient underwent MVR with 29 mm Mosaic valve by Dr. WLavonna Monarchon 10/24/22. She returned to the OR shortly after for control of large vessel bleeder on right inferior pericardial. Moderate pericardial and pleural effusions were evacuated. She did well post-op and was stable for discharge 10/30/22 after diuresis and titration of GDMT.   Here for f/u. At last visit was orthostatic. Lasix made prn. Carvedilol cut back. Says she isn't feeling so well. SOB with any activity. Dizzy when standing. Minimal edema. Takes lasix as needed No orthopnea or PND. No fevers or chills. No problem with chest wound. Says she is very anxious and feels that is making her symptoms worse. She is working with her psychiatrist and meds recently changed.   Lives at home with her husband who has dementia.   ReDs 35%   ROS: All systems negative except as listed in HPI, PMH and Problem List.  SH:  Social History   Socioeconomic History   Marital status: Married    Spouse name: AEmrey Thornley  Number of children: 0   Years of education: BS in education   Highest education level: Not on file  Occupational History   Occupation:  Retired SSurveyor, minerals Tobacco Use   Smoking status: Former    Types: Cigarettes    Quit date: 04/13/1997    Years since quitting: 25.6   Smokeless tobacco: Never  Vaping Use   Vaping Use: Never used  Substance and Sexual Activity   Alcohol use: Not Currently   Drug use: No   Sexual activity: Not Currently  Other Topics Concern   Not on file  Social History Narrative   [place]  [place].   [place]   Left-handed   Lives in a two story home       Social Determinants of Health   Financial Resource Strain: Low Risk  (03/30/2022)   Overall Financial Resource Strain (CARDIA)    Difficulty of Paying Living Expenses: Not hard at all  Food Insecurity: No Food Insecurity (10/17/2022)   Hunger Vital Sign    Worried About Running Out of Food in the Last Year: Never true    Ran Out of Food in the Last Year: Never true  Transportation Needs: No Transportation Needs (10/17/2022)   PRAPARE - THydrologist(Medical): No    Lack of Transportation (Non-Medical): No  Physical Activity: Not on file  Stress: No Stress Concern Present (03/30/2022)   [place]   Feeling of Stress :  Not at all  Social Connections: Unknown (03/30/2022)   Social Connection and Isolation Panel [NHANES]    Frequency of Communication with Friends and Family: Not on file    Frequency of Social Gatherings with Friends and Family: Not on file    Attends Religious Services: Not on file    Active Member of Clubs or Organizations: Not on file    Attends Archivist Meetings: Not on file    Marital Status: Married  Intimate Partner Violence: Not At Risk (10/15/2022)   Humiliation, Afraid, Rape, and Kick questionnaire    Fear of Current or Ex-Partner: No    Emotionally Abused: No    Physically Abused: No     Sexually Abused: No    FH:  Family History  Problem Relation Age of Onset   Anxiety disorder Mother    Paranoid behavior Mother    Hypertension Mother    Dementia Mother    High Cholesterol Mother    Diabetes Mother    Hyperlipidemia Mother    Depression Mother    Hypertension Father    High Cholesterol Father    Mood Disorder Sister    Stroke Sister    Anxiety disorder Maternal Aunt    Tuberculosis Paternal Grandfather    Drug abuse Cousin     Past Medical History:  Diagnosis Date   AICD (automatic cardioverter/defibrillator) present    Anemia    Anxiety    Back pain    CAD (coronary artery disease) 04/11/2016   S/p ant STEMI 5/17: LHC >> LAD proximal 80%, mid 80%, distal 50%, ostial D1 60%; LCx with LPDA lesion 30%; RCA Mild calcification with no significant stenosis in a medium caliber, nondominant RCA; LVEF is estimated at 45% with inferoapical and lateral wall akinesis >> PCI: PCI: 3.5 x 24 mm Promus DES to prox LAD, 2.5 x 12 mm Promus DES to mid LAD. // Myoview 7/21: EF 20, no ischemia; high risk    Chest pain    Chronic systolic CHF (congestive heart failure) ([place]) 03/21/2016   Echo 01/30/17: Diff HK, mild focal basal septal hypertrophy, EF 30-35, mild AI, MAC, mild MR // Echo 06/08/16: Mild focal basal septal hypertrophy, EF 25-30%, diff HK, ant-septal AK, Gr 1 DD, mild AI, MAC, mild MR, PASP 37 mmHg // Echo 03/18/16: EF 30-35%, ant-septal AK, Gr 1 DD, mild MR, severe LAE.   Constipation    COVID    February 2022 and Summer 2023 both mild cases   Depression    Dizziness    Dyspnea    Glaucoma    Gout    Heart disease    Heartburn    History of acute anterior wall MI 03/17/2016   History of heart attack    Hyperlipemia    Hypertension    Ischemic cardiomyopathy 10/02/2016   Refused ICD   Joint pain    Left bundle branch block    Myocardial infarction ([place])    2017   Nausea    Positive colorectal cancer screening using Cologuard test 03/01/2020    Pre-diabetes    S/P mitral valve clip implantation 10/04/2022   MitraClip NTWx1 and NTx1 with Dr. Burt Knack and Dr. Ali Lowe   Sleep apnea    SOB (shortness of breath)    Stroke ([place])    2021   Suicidal ideation 08/27/2018   Swelling    feet or legs    Current Outpatient Medications  Medication Sig Dispense Refill   aspirin EC 81 MG tablet  Take 1 tablet (81 mg total) by mouth daily. Swallow whole. 30 tablet 12   atorvastatin (LIPITOR) 80 MG tablet Take 80 mg by mouth daily.     carvedilol (COREG) 12.5 MG tablet Take 0.5 tablets (6.25 mg total) by mouth 2 (two) times daily with a meal. 60 tablet 1   dorzolamide-timolol (COSOPT) 22.3-6.8 MG/ML ophthalmic solution Place 1 drop into both eyes 2 (two) times daily.      empagliflozin ([name]) 10 MG TABS tablet Take 1 tablet (10 mg total) by mouth daily. 30 tablet 1   furosemide (LASIX) 40 MG tablet Take 1 tablet (40 mg total) by mouth daily as needed. 30 tablet 2   irbesartan (AVAPRO) 75 MG tablet Take 1/2 tablet (37.5 mg total) by mouth daily. 30 tablet 1   latanoprost (XALATAN) 0.005 % ophthalmic solution Place 1 drop into both eyes at bedtime.     potassium chloride SA (KLOR-CON M) 20 MEQ tablet Take 1 tablet (20 mEq total) by mouth daily as needed. 30 tablet 2   spironolactone (ALDACTONE) 25 MG tablet Take 12.5 mg by mouth daily.     traZODone (DESYREL) 150 MG tablet Take 150 mg by mouth at bedtime.     warfarin (COUMADIN) 3 MG tablet Take 1.5 tablets daily by mouth except for 1 tablet on Tuesday, Thursday, and Saturday or as directed by Anticoagulation Clinic. 45 tablet 1   No current facility-administered medications for this encounter.    Vitals:   11/28/22 1405  BP: 122/70  Pulse: 88  SpO2: 99%  Weight: 61.7 kg (136 lb)   Sitting 136/78 Standing 138/76  PHYSICAL EXAM: General:  Well appearing. No resp difficulty HEENT: normal Neck: supple. JVP 7. Carotids 2+ bilat; no bruits. No lymphadenopathy or thryomegaly  appreciated. Cor: PMI nondisplaced. Regular rate & rhythm. 2/6 MR  Lungs: clear Abdomen: soft, nontender, nondistended. No hepatosplenomegaly. No bruits or masses. Good bowel sounds. Extremities: no cyanosis, clubbing, rash, edema Neuro: alert & orientedx3, cranial nerves grossly intact. moves all 4 extremities w/o difficulty. Affect pleasant   ASSESSMENT & PLAN:    1. Mitral regurgitation, severe s/p bioprosthetics MVR  - s/p mTEER with 2 clips on 10/04/22 - Post op echo EF 25% with moderate to severe MR felt secondary to SLDA of the second MitraClip placement. - S/p MVR 12/23 with 29 mm Mosaic valve by Dr. Lavonna Monarch. Returned to OR  for control of bleeding.  - On Warfarin - I hear some residual MR on exam today. Echo pending this afternoon.   2. Chronic systolic HF - initially felt due to ischemic CM. S/p Anterior STEMI 5/17. Now suspect MR major contributing factor. Has LBBB. EF did not improve despite CRT-D - Echo (8/19): EF 30-35%. - cMRI 1/23 EF 15% No LGE. RV ok. Severe LV dyssynchrony Moderate MR  - CRT-D placed 01/02/22 with LBBB lead by Dr. Quentin Ore - Echo 07/17/22 EF 20-25% G2DD, RV ok. Severe central MR  - s/p mTEER with 2 clips on 10/04/22 - Echo 10/05/22: EF 25% with moderate to severe MR felt secondary to SLDA of the second MitraClip placement.  - S/p MVR 10/24/22 - Reports worsening NYHA III, volume status looks ok ReDS 35% + MR murmur on exam - Continue  coreg to 6.25 mg BID - Continue spiro 12.5 mg daily - Continue avapro 37.5 mg daily - Continue [name] 10 mg daily - Has echo later today - will review - Check labs  3. Coronary artery disease involving native coronary artery of  native heart without angina pectoris - Hx of anterior STEMI in 5/17 tx with DES x 2 to the LAD.   - No LGE on cMRI (1/23) - LHC 9/23 patent LAD stents - On aspirin + statin - No s/s angina  4. LBBB - s/p CRT-D 3/23 with Dr. Quentin Ore  - EF has not improved   5. Orthostatic  hypotension - improved  Glori Bickers, MD  2:43 PM

## 2022-11-28 NOTE — Progress Notes (Signed)
  Echocardiogram 2D Echocardiogram has been performed.  Melanie Ray 11/28/2022, 4:41 PM

## 2022-11-28 NOTE — Progress Notes (Signed)
ReDS Vest / Clip - 11/28/22 1500       ReDS Vest / Clip   Station Marker A    Ruler Value 25    ReDS Value Range Low volume    ReDS Actual Value 35

## 2022-11-29 ENCOUNTER — Telehealth: Payer: Self-pay

## 2022-11-29 NOTE — Telephone Encounter (Signed)
Melanie Ray underwent surgical MVR 10/24/22 and had an echo and visit with Dr. Haroldine Laws yesterday (11/28/2022).  She does not feel well. She reports weakness, fatigue, DOE. She has to have someone clean her house for her. She gets tired with the slightest exertion.  She is concerned because Dr. Haroldine Laws told her there is residual MV leaking.  Reviewed echo report with the patient, which states, "Mitral Valve: The mitral valve has been repaired/replaced. No evidence of mitral valve regurgitation. There is a Medtronic bioprosthetic valve present in the mitral position. Echo findings are consistent with normal structure and function of the mitral valve prosthesis. Mild to moderate mitral valve stenosis. MV peak gradient, 12.8 mmHg. The mean mitral valve gradient is 6.0 mmHg."  She requests Dr. Burt Knack review echo and let her know his thoughts, especially since she continues to feel unwell.  Echocardiogram results are in Dr. Antionette Char basket.

## 2022-12-03 DIAGNOSIS — R059 Cough, unspecified: Secondary | ICD-10-CM | POA: Diagnosis not present

## 2022-12-03 DIAGNOSIS — J069 Acute upper respiratory infection, unspecified: Secondary | ICD-10-CM | POA: Diagnosis not present

## 2022-12-05 ENCOUNTER — Ambulatory Visit: Payer: Medicare Other | Attending: Internal Medicine

## 2022-12-05 DIAGNOSIS — Z5181 Encounter for therapeutic drug level monitoring: Secondary | ICD-10-CM

## 2022-12-05 DIAGNOSIS — I639 Cerebral infarction, unspecified: Secondary | ICD-10-CM

## 2022-12-05 DIAGNOSIS — Z952 Presence of prosthetic heart valve: Secondary | ICD-10-CM

## 2022-12-05 LAB — POCT INR: INR: 1.3 — AB (ref 2.0–3.0)

## 2022-12-05 NOTE — Patient Instructions (Signed)
TAKE 2.5 TABLETS TODAY ONLY THEN INCREASE TO 1.5 TABLETS DAILY, EXCEPT 2 TABLETS ON TUESDAY AND THURSDAY. Recheck INR in 2 weeks.  Be consistent with 2 serving of greens per week.  [facility] [phone]

## 2022-12-07 MED FILL — Mannitol IV Soln 20%: INTRAVENOUS | Qty: 500 | Status: AC

## 2022-12-07 MED FILL — Electrolyte-R (PH 7.4) Solution: INTRAVENOUS | Qty: 5000 | Status: AC

## 2022-12-07 MED FILL — Heparin Sodium (Porcine) Inj 1000 Unit/ML: INTRAMUSCULAR | Qty: 10 | Status: AC

## 2022-12-07 MED FILL — Sodium Chloride IV Soln 0.9%: INTRAVENOUS | Qty: 2000 | Status: AC

## 2022-12-07 MED FILL — Sodium Bicarbonate IV Soln 8.4%: INTRAVENOUS | Qty: 100 | Status: AC

## 2022-12-10 ENCOUNTER — Other Ambulatory Visit (HOSPITAL_COMMUNITY): Payer: Self-pay

## 2022-12-17 ENCOUNTER — Telehealth: Payer: Self-pay | Admitting: Family Medicine

## 2022-12-17 NOTE — Telephone Encounter (Signed)
Pt called stating she would like to see Alethia Berthold as a neurologist instead of Manuella Ghazi

## 2022-12-17 NOTE — Telephone Encounter (Signed)
Called and spoke with Pt and let her know that Alethia Berthold is a psychiatrist.  She asked if you had a recommendation.  She no longer wishes to see Dr. Manuella Ghazi.

## 2022-12-18 ENCOUNTER — Encounter: Payer: Medicare Other | Admitting: *Deleted

## 2022-12-18 NOTE — Telephone Encounter (Signed)
S/w pt - advised next step would be [facility] or [facility]. Pt stated she wants to think about it. WCB

## 2022-12-19 ENCOUNTER — Ambulatory Visit: Payer: Medicare Other | Attending: Internal Medicine | Admitting: Pharmacist

## 2022-12-19 DIAGNOSIS — Z5181 Encounter for therapeutic drug level monitoring: Secondary | ICD-10-CM | POA: Diagnosis not present

## 2022-12-19 DIAGNOSIS — Z952 Presence of prosthetic heart valve: Secondary | ICD-10-CM | POA: Diagnosis not present

## 2022-12-19 DIAGNOSIS — I639 Cerebral infarction, unspecified: Secondary | ICD-10-CM | POA: Diagnosis not present

## 2022-12-19 LAB — POCT INR: INR: 1.5 — AB (ref 2.0–3.0)

## 2022-12-19 MED ORDER — WARFARIN SODIUM 3 MG PO TABS: ORAL_TABLET | ORAL | 0 refills | Status: DC

## 2022-12-19 NOTE — Patient Instructions (Signed)
Description   Take an extra 1/2 tablet today and then increase dose to TWO tablets every day except 1.5 tablets on Monday and Friday Recheck INR in 2 weeks.  Be consistent with 2 serving of greens per week.  [facility] [phone]

## 2022-12-20 MED FILL — Potassium Chloride Inj 2 mEq/ML: INTRAVENOUS | Qty: 20 | Status: AC

## 2022-12-20 MED FILL — Lidocaine HCl Local Preservative Free (PF) Inj 2%: INTRAMUSCULAR | Qty: 14 | Status: AC

## 2022-12-20 MED FILL — Heparin Sodium (Porcine) Inj 1000 Unit/ML: Qty: 1000 | Status: AC

## 2022-12-23 DIAGNOSIS — R059 Cough, unspecified: Secondary | ICD-10-CM | POA: Diagnosis not present

## 2022-12-23 DIAGNOSIS — J019 Acute sinusitis, unspecified: Secondary | ICD-10-CM | POA: Diagnosis not present

## 2022-12-24 ENCOUNTER — Ambulatory Visit: Payer: Medicare Other

## 2022-12-24 DIAGNOSIS — Z79899 Other long term (current) drug therapy: Secondary | ICD-10-CM | POA: Diagnosis not present

## 2022-12-24 DIAGNOSIS — F251 Schizoaffective disorder, depressive type: Secondary | ICD-10-CM | POA: Diagnosis not present

## 2022-12-24 NOTE — Telephone Encounter (Signed)
S/w pt - stated wants referral to [facility] for psychiatry, stated wants new psychiatrist. And will continue to see Dr Manuella Ghazi due to not wanting to have to travel much for a neurologist.  Ok to send psych referral to [facility]?

## 2022-12-24 NOTE — Telephone Encounter (Signed)
Pt wants an update on the referral for Dr. Weber Cooks

## 2022-12-24 NOTE — Telephone Encounter (Signed)
It is fine to send the referral to Dr Weber Cooks.

## 2022-12-24 NOTE — Progress Notes (Unsigned)
Telephone orientation rescheduled.

## 2022-12-25 ENCOUNTER — Other Ambulatory Visit: Payer: Self-pay

## 2022-12-25 DIAGNOSIS — F515 Nightmare disorder: Secondary | ICD-10-CM

## 2022-12-25 DIAGNOSIS — G4700 Insomnia, unspecified: Secondary | ICD-10-CM

## 2022-12-25 DIAGNOSIS — F419 Anxiety disorder, unspecified: Secondary | ICD-10-CM

## 2022-12-25 DIAGNOSIS — Z8659 Personal history of other mental and behavioral disorders: Secondary | ICD-10-CM

## 2022-12-25 DIAGNOSIS — R413 Other amnesia: Secondary | ICD-10-CM

## 2022-12-25 NOTE — Telephone Encounter (Signed)
Referral sent 

## 2022-12-26 ENCOUNTER — Other Ambulatory Visit: Payer: Self-pay

## 2022-12-26 ENCOUNTER — Encounter: Payer: Medicare Other | Attending: Cardiovascular Disease

## 2022-12-26 DIAGNOSIS — Z952 Presence of prosthetic heart valve: Secondary | ICD-10-CM

## 2022-12-26 NOTE — Progress Notes (Signed)
Virtual Visit completed. Patient informed on EP and RD appointment and 6 Minute walk test. Patient also informed of patient health questionnaires on My Chart. Patient Verbalizes understanding. Visit diagnosis can be found in [facility] 10/24/2022.

## 2022-12-28 ENCOUNTER — Telehealth: Payer: Self-pay

## 2022-12-28 ENCOUNTER — Encounter: Payer: Self-pay | Admitting: Family Medicine

## 2022-12-28 ENCOUNTER — Ambulatory Visit (INDEPENDENT_AMBULATORY_CARE_PROVIDER_SITE_OTHER): Payer: Medicare Other | Admitting: Family Medicine

## 2022-12-28 VITALS — BP 132/78 | HR 85 | Temp 98.3°F | Ht 63.0 in | Wt 130.2 lb

## 2022-12-28 DIAGNOSIS — F331 Major depressive disorder, recurrent, moderate: Secondary | ICD-10-CM

## 2022-12-28 DIAGNOSIS — E119 Type 2 diabetes mellitus without complications: Secondary | ICD-10-CM | POA: Diagnosis not present

## 2022-12-28 DIAGNOSIS — I639 Cerebral infarction, unspecified: Secondary | ICD-10-CM

## 2022-12-28 DIAGNOSIS — R0600 Dyspnea, unspecified: Secondary | ICD-10-CM

## 2022-12-28 DIAGNOSIS — I10 Essential (primary) hypertension: Secondary | ICD-10-CM

## 2022-12-28 DIAGNOSIS — F419 Anxiety disorder, unspecified: Secondary | ICD-10-CM

## 2022-12-28 NOTE — Assessment & Plan Note (Addendum)
Significant depression.  Patient is on Vraylar and trazodone through her current psychiatrist.  I have recently placed a referral to new psychiatrist.  Discussed if she does not hear from them in the next week she needs to let us know.  Discussed she may need to continue to follow with her current psychiatrist until she sees the new psychiatrist.  I did advise her to seek medical attention in the emergency department if she developed active suicidal thoughts.

## 2022-12-28 NOTE — Assessment & Plan Note (Signed)
Possibly related to anxiety though she does have a fairly significant cardiac history.  EKG performed today.  Will get lab work to evaluate for further underlying causes such as fluid accumulation or anemia.  She will need to follow-up with cardiology as well.  If she has any worsening symptoms she needs to go to the emergency room.

## 2022-12-28 NOTE — Assessment & Plan Note (Signed)
Chronic issue.  Well-controlled.  She will continue carvedilol 6.25 mg twice daily, irbesartan 37.5 mg daily.

## 2022-12-28 NOTE — Assessment & Plan Note (Signed)
Check A1c.  Continue [name] 10 mg daily.

## 2022-12-28 NOTE — Progress Notes (Signed)
Tommi Rumps, MD Phone: [phone]  Melanie Ray is a 72 y.o. female who presents today for f/u.  HYPERTENSION Disease Monitoring Home BP Monitoring not checking Chest pain- no    Dyspnea-see below Medications Compliance-  taking coreg, irbesartan, spironolactone  Edema- no BMET    Component Value Date/Time   NA 138 11/08/2022 1056   NA 143 10/10/2022 0923   K 4.0 11/08/2022 1056   CL 109 11/08/2022 1056   CO2 21 (L) 11/08/2022 1056   GLUCOSE 89 11/08/2022 1056   BUN 15 11/08/2022 1056   BUN 21 10/10/2022 0923   CREATININE 0.90 11/08/2022 1056   CREATININE 1.12 (H) 10/16/2016 1412   CALCIUM 9.3 11/08/2022 1056   GFRNONAA >60 11/08/2022 1056   GFRAA 58 (L) 11/30/2020 1506   DIABETES Disease Monitoring: Polyuria/phagia/dipsia- no      Optho- due Medications: Compliance- taking jardiance Hypoglycemic symptoms- no  Shortness of breath: Patient notes lots of dyspnea over the last month.  Notes it occurs when she gets really anxious and also occurs with exertion.  She is status post mitral valve replacement.  She saw the advanced heart failure clinic on 11/28/2022 and reported shortness of breath with any activity to them.  She also noted dizziness with standing.  They checked an echo that revealed that her mitral valve prosthesis was functioning normally and also revealed known severe left ventricular dysfunction though they reported findings were stable.  Anxiety/depression: Patient reports this is severe.  She has been seeing psychiatry and they now have her on Vraylar.  She last saw them 2 weeks ago.  She plans to see a new psychiatrist in the future.  Trazodone helps with her sleep.  She does have thoughts of being better off dead though does not have any active suicidal thoughts.   Social History   Tobacco Use  Smoking Status Former   Packs/day: 0.50   Years: 25.00   Total pack years: 12.50   Types: Cigarettes   Quit date: 04/13/1997   Years since quitting:  25.7  Smokeless Tobacco Never    Current Outpatient Medications on File Prior to Visit  Medication Sig Dispense Refill   amoxicillin-clavulanate (AUGMENTIN) 875-125 MG tablet Take by mouth.     aspirin EC 81 MG tablet Take 1 tablet (81 mg total) by mouth daily. Swallow whole. 30 tablet 12   atorvastatin (LIPITOR) 80 MG tablet Take 80 mg by mouth daily.     Cariprazine HCl (VRAYLAR) 1.5 & 3 MG CPPK Take by mouth.     carvedilol (COREG) 12.5 MG tablet Take 0.5 tablets (6.25 mg total) by mouth 2 (two) times daily with a meal. 60 tablet 1   dorzolamide-timolol (COSOPT) 22.3-6.8 MG/ML ophthalmic solution Place 1 drop into both eyes 2 (two) times daily.      empagliflozin ([name]) 10 MG TABS tablet Take 1 tablet (10 mg total) by mouth daily. 30 tablet 1   furosemide (LASIX) 40 MG tablet Take 1 tablet (40 mg total) by mouth daily as needed. 30 tablet 2   gabapentin (NEURONTIN) 100 MG capsule Take 100 mg by mouth 2 (two) times daily.     irbesartan (AVAPRO) 75 MG tablet Take 1/2 tablet (37.5 mg total) by mouth daily. 30 tablet 1   latanoprost (XALATAN) 0.005 % ophthalmic solution Place 1 drop into both eyes at bedtime.     traZODone (DESYREL) 150 MG tablet Take 150 mg by mouth at bedtime.     warfarin (COUMADIN) 3 MG tablet Take  1 to 2 tablets by mouth daily as directed by Coumadin Clinic 60 tablet 0   No current facility-administered medications on file prior to visit.     ROS see history of present illness  Objective  Physical Exam Vitals:   12/28/22 1415  BP: 132/78  Pulse: 85  Temp: 98.3 F (36.8 C)  SpO2: 99%    BP Readings from Last 3 Encounters:  12/28/22 132/78  11/28/22 122/70  11/26/22 116/71   Wt Readings from Last 3 Encounters:  12/28/22 130 lb 3.2 oz (59.1 kg)  11/28/22 136 lb (61.7 kg)  11/26/22 135 lb (61.2 kg)    Physical Exam Constitutional:      General: She is not in acute distress.    Appearance: She is not diaphoretic.  Cardiovascular:     Rate  and Rhythm: Normal rate and regular rhythm.     Heart sounds: Normal heart sounds.  Pulmonary:     Effort: Pulmonary effort is normal.     Breath sounds: Normal breath sounds.  Musculoskeletal:     Right lower leg: No edema.     Left lower leg: No edema.  Skin:    General: Skin is warm and dry.  Neurological:     Mental Status: She is alert.    EKG: Sinus rhythm, rate 85, left bundle branch block  Assessment/Plan: Please see individual problem list.  Essential hypertension Assessment & Plan: Chronic issue.  Well-controlled.  She will continue carvedilol 6.25 mg twice daily, irbesartan 37.5 mg daily.   Type 2 diabetes mellitus without complication, without long-term current use of insulin (HCC) Assessment & Plan: Check A1c.  Continue [name] 10 mg daily.   Dyspnea, unspecified type Assessment & Plan: Possibly related to anxiety though she does have a fairly significant cardiac history.  EKG performed today.  Will get lab work to evaluate for further underlying causes such as fluid accumulation or anemia.  She will need to follow-up with cardiology as well.  If she has any worsening symptoms she needs to go to the emergency room.  Orders: -     EKG 12-Lead -     Comprehensive metabolic panel -     CBC -     TSH -     Brain natriuretic peptide  Moderate episode of recurrent major depressive disorder (HCC) Assessment & Plan: Significant depression.  Patient is on Vraylar and trazodone through her current psychiatrist.  I have recently placed a referral to new psychiatrist.  Discussed if she does not hear from them in the next week she needs to let us know.  Discussed she may need to continue to follow with her current psychiatrist until she sees the new psychiatrist.  I did advise her to seek medical attention in the emergency department if she developed active suicidal thoughts.   Anxiety Assessment & Plan: Significant anxiety.  Patient is on Vraylar and trazodone through  her current psychiatrist.  I have recently placed a referral to new psychiatrist.  Discussed if she does not hear from them in the next week she needs to let us know.  Discussed she may need to continue to follow with her current psychiatrist until she sees the new psychiatrist.    Return in about 3 months (around 03/28/2023).  Patient was advised that I was checking with her cardiologist on her EKG result and to help determine the plan. Discussed having the patient wait for a response and then we could determine the next step in management.  I was subsequently advised by the CMA that the patient opened the door while I was in another room and said she left the burner on at home and had to leave. She was gone prior to me getting to talk to her again.  Subsequently the patient returned to the office and had her lab work done.  I did send a message to Dr. Burt Knack, Dr. Quentin Ore, and Dr. Haroldine Laws to see if somebody could review her EKG for me.  Dr. Haroldine Laws got back to me and stated that she currently had a left bundle branch block and that she was not pacing.  He added her electrophysiologist and their nurse to the chat to try to sort this out for the patient.  I released the patient to go and advised we will contact her with any further advice.  Discussed if she develops chest pain, worsening shortness of breath, palpitations, syncope, or any other symptoms she would need to seek medical attention immediately.  I have spent 42 minutes in the care of this patient regarding history taking, documentation, completion of exam, discussion of plan, placing orders, communicating with cardiology.  This time did not include the duration of time used to read the EKG.   Tommi Rumps, MD [place]

## 2022-12-28 NOTE — Assessment & Plan Note (Signed)
Significant anxiety.  Patient is on Vraylar and trazodone through her current psychiatrist.  I have recently placed a referral to new psychiatrist.  Discussed if she does not hear from them in the next week she needs to let us know.  Discussed she may need to continue to follow with her current psychiatrist until she sees the new psychiatrist.

## 2022-12-29 LAB — CBC
HCT: 37.8 % (ref 35.0–45.0)
Hemoglobin: 12 g/dL (ref 11.7–15.5)
MCH: 25.9 pg — ABNORMAL LOW (ref 27.0–33.0)
MCHC: 31.7 g/dL — ABNORMAL LOW (ref 32.0–36.0)
MCV: 81.5 fL (ref 80.0–100.0)
MPV: 10.2 fL (ref 7.5–12.5)
Platelets: 358 10*3/uL (ref 140–400)
RBC: 4.64 10*6/uL (ref 3.80–5.10)
RDW: 13.2 % (ref 11.0–15.0)
WBC: 7.8 10*3/uL (ref 3.8–10.8)

## 2022-12-29 LAB — COMPREHENSIVE METABOLIC PANEL
AG Ratio: 1.1 (calc) (ref 1.0–2.5)
ALT: 13 U/L (ref 6–29)
AST: 18 U/L (ref 10–35)
Albumin: 3.6 g/dL (ref 3.6–5.1)
Alkaline phosphatase (APISO): 120 U/L (ref 37–153)
BUN: 17 mg/dL (ref 7–25)
CO2: 24 mmol/L (ref 20–32)
Calcium: 9.5 mg/dL (ref 8.6–10.4)
Chloride: 107 mmol/L (ref 98–110)
Creat: 0.92 mg/dL (ref 0.60–1.00)
Globulin: 3.2 g/dL (calc) (ref 1.9–3.7)
Glucose, Bld: 61 mg/dL — ABNORMAL LOW (ref 65–99)
Potassium: 4.1 mmol/L (ref 3.5–5.3)
Sodium: 143 mmol/L (ref 135–146)
Total Bilirubin: 0.4 mg/dL (ref 0.2–1.2)
Total Protein: 6.8 g/dL (ref 6.1–8.1)

## 2022-12-29 LAB — TSH: TSH: 0.79 mIU/L (ref 0.40–4.50)

## 2022-12-29 LAB — SPECIMEN COMPROMISED

## 2022-12-29 LAB — BRAIN NATRIURETIC PEPTIDE: Brain Natriuretic Peptide: 1053 pg/mL — ABNORMAL HIGH (ref <100)

## 2022-12-31 ENCOUNTER — Other Ambulatory Visit: Payer: Self-pay | Admitting: Family

## 2022-12-31 ENCOUNTER — Ambulatory Visit: Payer: Medicare Other

## 2022-12-31 DIAGNOSIS — F251 Schizoaffective disorder, depressive type: Secondary | ICD-10-CM | POA: Diagnosis not present

## 2022-12-31 DIAGNOSIS — Z79899 Other long term (current) drug therapy: Secondary | ICD-10-CM | POA: Diagnosis not present

## 2022-12-31 DIAGNOSIS — I5022 Chronic systolic (congestive) heart failure: Secondary | ICD-10-CM

## 2022-12-31 NOTE — Telephone Encounter (Signed)
Pt calling back regarding function of her device.  Advised that her device is functioning normally.  She states she is having shortness of breath.  Would like a call back from HF office to address.  Advised would forward to Dr. Clayborne Dana office for follow up.

## 2022-12-31 NOTE — Telephone Encounter (Signed)
Pt aware device is functioning normally  Per Dr Haroldine Laws can we get her an appt with NP/PA this week or next   Add on 3/4 @ 11  Pt aware of appt details and appreciative of returned call

## 2023-01-01 ENCOUNTER — Telehealth: Payer: Self-pay | Admitting: Family Medicine

## 2023-01-01 NOTE — Telephone Encounter (Signed)
Pt called stating she forgot to tell the provider she has been having diarrhea for eight days and what to know what the doctor recommends

## 2023-01-01 NOTE — Telephone Encounter (Signed)
Patient is scheduled to see [name] today at 3:40

## 2023-01-02 ENCOUNTER — Ambulatory Visit: Payer: Medicare Other | Attending: Internal Medicine

## 2023-01-02 ENCOUNTER — Encounter: Payer: Self-pay | Admitting: Nurse Practitioner

## 2023-01-02 ENCOUNTER — Ambulatory Visit (INDEPENDENT_AMBULATORY_CARE_PROVIDER_SITE_OTHER): Payer: Medicare Other | Admitting: Nurse Practitioner

## 2023-01-02 VITALS — BP 122/68 | HR 75 | Temp 98.8°F | Ht 63.0 in | Wt 124.4 lb

## 2023-01-02 DIAGNOSIS — R197 Diarrhea, unspecified: Secondary | ICD-10-CM | POA: Diagnosis not present

## 2023-01-02 DIAGNOSIS — I639 Cerebral infarction, unspecified: Secondary | ICD-10-CM | POA: Insufficient documentation

## 2023-01-02 DIAGNOSIS — Z952 Presence of prosthetic heart valve: Secondary | ICD-10-CM | POA: Insufficient documentation

## 2023-01-02 HISTORY — DX: Diarrhea, unspecified: R19.7

## 2023-01-02 LAB — POCT INR: INR: 2.4 (ref 2.0–3.0)

## 2023-01-02 NOTE — Progress Notes (Signed)
[name], NP-C Phone: [phone]  Melanie Ray is a 72 y.o. female who presents today for diarrhea.  Diarrhea: Patient complains of diarrhea. Onset of diarrhea was approximately 10 days ago. Diarrhea is occurring approximately 7 times per day. Patient describes diarrhea as watery. Diarrhea has been associated with recent antibiotic use, Augmentin for a sinus infection. Patient denies blood in stool, fever, recent travel, significant abdominal pain. She has not tried anything over the counter.   Patient reports the diarrhea starting when she started the antibiotics. She recently stopped taking the antibiotic. Denies nausea, vomiting, fever. Denies abdominal pain and cramping. She reports decreased appetite. She did eat shrimp and cabbage from [place] today. She has had some weight loss. She has been drinking fluids. She reports she did have a fever and vomiting when first starting the antibiotic but they have resolved. She reports her symptoms have improved since stopping the antibiotic but she continues to have diarrhea.   Social History   Tobacco Use  Smoking Status Former   Packs/day: 0.50   Years: 25.00   Total pack years: 12.50   Types: Cigarettes   Quit date: 04/13/1997   Years since quitting: 25.7  Smokeless Tobacco Never    Current Outpatient Medications on File Prior to Visit  Medication Sig Dispense Refill   aspirin EC 81 MG tablet Take 1 tablet (81 mg total) by mouth daily. Swallow whole. 30 tablet 12   atorvastatin (LIPITOR) 80 MG tablet Take 80 mg by mouth daily.     Cariprazine HCl (VRAYLAR) 1.5 & 3 MG CPPK Take by mouth.     carvedilol (COREG) 12.5 MG tablet Take 0.5 tablets (6.25 mg total) by mouth 2 (two) times daily with a meal. 60 tablet 1   dorzolamide-timolol (COSOPT) 22.3-6.8 MG/ML ophthalmic solution Place 1 drop into both eyes 2 (two) times daily.      empagliflozin ([name]) 10 MG TABS tablet Take 1 tablet (10 mg total) by mouth daily. 30  tablet 1   furosemide (LASIX) 40 MG tablet Take 1 tablet (40 mg total) by mouth daily as needed. 30 tablet 2   gabapentin (NEURONTIN) 100 MG capsule Take 100 mg by mouth 2 (two) times daily.     irbesartan (AVAPRO) 75 MG tablet Take 1/2 tablet (37.5 mg total) by mouth daily. 30 tablet 1   latanoprost (XALATAN) 0.005 % ophthalmic solution Place 1 drop into both eyes at bedtime.     traZODone (DESYREL) 150 MG tablet Take 150 mg by mouth at bedtime.     warfarin (COUMADIN) 3 MG tablet Take 1 to 2 tablets by mouth daily as directed by [facility] 60 tablet 0   amoxicillin-clavulanate (AUGMENTIN) 875-125 MG tablet Take by mouth. (Patient not taking: Reported on 01/02/2023)     No current facility-administered medications on file prior to visit.    ROS see history of present illness  Objective  Physical Exam Vitals:   01/02/23 1546  BP: 122/68  Pulse: 75  Temp: 98.8 F (37.1 C)  SpO2: 99%    BP Readings from Last 3 Encounters:  01/02/23 122/68  12/28/22 132/78  11/28/22 122/70   Wt Readings from Last 3 Encounters:  01/02/23 124 lb 6.4 oz (56.4 kg)  12/28/22 130 lb 3.2 oz (59.1 kg)  11/28/22 136 lb (61.7 kg)    Physical Exam Constitutional:      General: She is not in acute distress.    Appearance: Normal appearance.  HENT:  Head: Normocephalic.  Cardiovascular:     Rate and Rhythm: Normal rate and regular rhythm.     Heart sounds: Normal heart sounds.  Pulmonary:     Effort: Pulmonary effort is normal.     Breath sounds: Normal breath sounds.  Abdominal:     General: Abdomen is flat. Bowel sounds are normal. There is no distension.     Palpations: Abdomen is soft. There is no mass.     Tenderness: There is no abdominal tenderness.  Skin:    General: Skin is warm and dry.  Neurological:     General: No focal deficit present.     Mental Status: She is alert.  Psychiatric:        Mood and Affect: Mood normal.        Behavior: Behavior normal.     Assessment/Plan: Please see individual problem list.  Diarrhea, unspecified type Assessment & Plan: Lab work from Friday reviewed. Will recheck BMP and CBC. Exam WNL. Advised increasing fluid intake, small, frequent sips of water. Encouraged bland diet, can increase as tolerated. Recommended OTC Pepto or Imodium. Strict return precautions given to patient, seek immediate care if worsening, not improving or severe abdominal pain develops.   Orders: -     CBC with Differential/Platelet -     Basic metabolic panel     Return if symptoms worsen or fail to improve.   [name], NP-C [place]

## 2023-01-02 NOTE — Assessment & Plan Note (Addendum)
Lab work from Friday reviewed. Will recheck BMP and CBC. Exam WNL. Advised increasing fluid intake, small, frequent sips of water. Encouraged bland diet, can increase as tolerated. Recommended OTC Pepto or Imodium. Strict return precautions given to patient, seek immediate care if worsening, not improving or severe abdominal pain develops.

## 2023-01-02 NOTE — Patient Instructions (Signed)
Description   Continue on same dosage 2 tablets daily except 1.5 tablets on Mondays and Fridays Recheck INR in 3 weeks.  Be consistent with 2 serving of greens per week.  [facility] [phone]

## 2023-01-03 LAB — CBC WITH DIFFERENTIAL/PLATELET
Basophils Absolute: 0.1 10*3/uL (ref 0.0–0.1)
Basophils Relative: 0.6 % (ref 0.0–3.0)
Eosinophils Absolute: 0.1 10*3/uL (ref 0.0–0.7)
Eosinophils Relative: 1.7 % (ref 0.0–5.0)
HCT: 34.6 % — ABNORMAL LOW (ref 36.0–46.0)
Hemoglobin: 11.6 g/dL — ABNORMAL LOW (ref 12.0–15.0)
Lymphocytes Relative: 12.5 % (ref 12.0–46.0)
Lymphs Abs: 1.1 10*3/uL (ref 0.7–4.0)
MCHC: 33.5 g/dL (ref 30.0–36.0)
MCV: 79 fl (ref 78.0–100.0)
Monocytes Absolute: 1.5 10*3/uL — ABNORMAL HIGH (ref 0.1–1.0)
Monocytes Relative: 16.8 % — ABNORMAL HIGH (ref 3.0–12.0)
Neutro Abs: 5.9 10*3/uL (ref 1.4–7.7)
Neutrophils Relative %: 68.4 % (ref 43.0–77.0)
Platelets: 379 10*3/uL (ref 150.0–400.0)
RBC: 4.38 Mil/uL (ref 3.87–5.11)
RDW: 14.7 % (ref 11.5–15.5)
WBC: 8.7 10*3/uL (ref 4.0–10.5)

## 2023-01-03 LAB — BASIC METABOLIC PANEL
BUN: 21 mg/dL (ref 6–23)
CO2: 20 mEq/L (ref 19–32)
Calcium: 9.4 mg/dL (ref 8.4–10.5)
Chloride: 110 mEq/L (ref 96–112)
Creatinine, Ser: 0.96 mg/dL (ref 0.40–1.20)
GFR: 59.04 mL/min — ABNORMAL LOW (ref >60.00)
Glucose, Bld: 101 mg/dL — ABNORMAL HIGH (ref 70–99)
Potassium: 2.9 mEq/L — ABNORMAL LOW (ref 3.5–5.1)
Sodium: 141 mEq/L (ref 135–145)

## 2023-01-04 ENCOUNTER — Ambulatory Visit (INDEPENDENT_AMBULATORY_CARE_PROVIDER_SITE_OTHER): Payer: Medicare Other

## 2023-01-04 DIAGNOSIS — I447 Left bundle-branch block, unspecified: Secondary | ICD-10-CM | POA: Diagnosis not present

## 2023-01-07 ENCOUNTER — Ambulatory Visit (HOSPITAL_COMMUNITY)
Admission: RE | Admit: 2023-01-07 | Discharge: 2023-01-07 | Disposition: A | Discharge status: Home / Self Care | Payer: Medicare Other | Source: Ambulatory Visit | Attending: Family Medicine | Admitting: Family Medicine

## 2023-01-07 ENCOUNTER — Encounter (HOSPITAL_COMMUNITY): Payer: Self-pay

## 2023-01-07 VITALS — BP 144/70 | HR 79 | Wt 131.4 lb

## 2023-01-07 DIAGNOSIS — E785 Hyperlipidemia, unspecified: Secondary | ICD-10-CM | POA: Insufficient documentation

## 2023-01-07 DIAGNOSIS — I2510 Atherosclerotic heart disease of native coronary artery without angina pectoris: Secondary | ICD-10-CM | POA: Insufficient documentation

## 2023-01-07 DIAGNOSIS — I252 Old myocardial infarction: Secondary | ICD-10-CM | POA: Insufficient documentation

## 2023-01-07 DIAGNOSIS — I110 Hypertensive heart disease with heart failure: Secondary | ICD-10-CM | POA: Insufficient documentation

## 2023-01-07 DIAGNOSIS — E119 Type 2 diabetes mellitus without complications: Secondary | ICD-10-CM | POA: Insufficient documentation

## 2023-01-07 DIAGNOSIS — Z952 Presence of prosthetic heart valve: Secondary | ICD-10-CM | POA: Diagnosis not present

## 2023-01-07 DIAGNOSIS — I447 Left bundle-branch block, unspecified: Secondary | ICD-10-CM | POA: Insufficient documentation

## 2023-01-07 DIAGNOSIS — G4733 Obstructive sleep apnea (adult) (pediatric): Secondary | ICD-10-CM | POA: Insufficient documentation

## 2023-01-07 DIAGNOSIS — I340 Nonrheumatic mitral (valve) insufficiency: Secondary | ICD-10-CM | POA: Insufficient documentation

## 2023-01-07 DIAGNOSIS — I951 Orthostatic hypotension: Secondary | ICD-10-CM | POA: Insufficient documentation

## 2023-01-07 DIAGNOSIS — R5383 Other fatigue: Secondary | ICD-10-CM

## 2023-01-07 DIAGNOSIS — I5022 Chronic systolic (congestive) heart failure: Secondary | ICD-10-CM | POA: Insufficient documentation

## 2023-01-07 LAB — CUP PACEART REMOTE DEVICE CHECK
Battery Remaining Longevity: 66 mo
Battery Remaining Percentage: 80 %
Battery Voltage: 3.01 V
Brady Statistic AP VP Percent: 94 %
Brady Statistic AP VS Percent: 1 %
Brady Statistic AS VP Percent: 6 %
Brady Statistic AS VS Percent: 1 %
Brady Statistic RA Percent Paced: 93 %
Date Time Interrogation Session: 20240301020017
HighPow Impedance: 48 Ohm
HighPow Impedance: 48 Ohm
Implantable Lead Connection Status: 753985
Implantable Lead Connection Status: 753985
Implantable Lead Connection Status: 753985
Implantable Lead Implant Date: 20230228
Implantable Lead Implant Date: 20230228
Implantable Lead Implant Date: 20230228
Implantable Lead Location: 753858
Implantable Lead Location: 753859
Implantable Lead Location: 753860
Implantable Lead Model: 3830
Implantable Pulse Generator Implant Date: 20230228
Lead Channel Impedance Value: 440 Ohm
Lead Channel Impedance Value: 440 Ohm
Lead Channel Impedance Value: 560 Ohm
Lead Channel Pacing Threshold Amplitude: 0.5 V
Lead Channel Pacing Threshold Amplitude: 0.625 V
Lead Channel Pacing Threshold Amplitude: 0.75 V
Lead Channel Pacing Threshold Pulse Width: 0.5 ms
Lead Channel Pacing Threshold Pulse Width: 0.5 ms
Lead Channel Pacing Threshold Pulse Width: 0.5 ms
Lead Channel Sensing Intrinsic Amplitude: 12 mV
Lead Channel Sensing Intrinsic Amplitude: 4.8 mV
Lead Channel Setting Pacing Amplitude: 2 V
Lead Channel Setting Pacing Amplitude: 2 V
Lead Channel Setting Pacing Amplitude: 2 V
Lead Channel Setting Pacing Pulse Width: 0.5 ms
Lead Channel Setting Pacing Pulse Width: 0.5 ms
Lead Channel Setting Sensing Sensitivity: 0.5 mV
Pulse Gen Serial Number: 8904264
Zone Setting Status: 755011

## 2023-01-07 LAB — BASIC METABOLIC PANEL
Anion gap: 4 — ABNORMAL LOW (ref 5–15)
BUN: 11 mg/dL (ref 8–23)
CO2: 27 mmol/L (ref 22–32)
Calcium: 8.8 mg/dL — ABNORMAL LOW (ref 8.9–10.3)
Chloride: 110 mmol/L (ref 98–111)
Creatinine, Ser: 1 mg/dL (ref 0.44–1.00)
GFR, Estimated: 60 mL/min — ABNORMAL LOW (ref >60)
Glucose, Bld: 75 mg/dL (ref 70–99)
Potassium: 3.6 mmol/L (ref 3.5–5.1)
Sodium: 141 mmol/L (ref 135–145)

## 2023-01-07 LAB — CBC
HCT: 35.6 % — ABNORMAL LOW (ref 36.0–46.0)
Hemoglobin: 11.3 g/dL — ABNORMAL LOW (ref 12.0–15.0)
MCH: 25.7 pg — ABNORMAL LOW (ref 26.0–34.0)
MCHC: 31.7 g/dL (ref 30.0–36.0)
MCV: 81.1 fL (ref 80.0–100.0)
Platelets: 356 10*3/uL (ref 150–400)
RBC: 4.39 MIL/uL (ref 3.87–5.11)
RDW: 13.8 % (ref 11.5–15.5)
WBC: 6.5 10*3/uL (ref 4.0–10.5)
nRBC: 0 % (ref 0.0–0.2)

## 2023-01-07 LAB — FERRITIN: Ferritin: 43 ng/mL (ref 11–307)

## 2023-01-07 LAB — BRAIN NATRIURETIC PEPTIDE: B Natriuretic Peptide: 1367.3 pg/mL — ABNORMAL HIGH (ref 0.0–100.0)

## 2023-01-07 LAB — IRON AND TIBC
Iron: 65 ug/dL (ref 28–170)
Saturation Ratios: 25 % (ref 10.4–31.8)
TIBC: 262 ug/dL (ref 250–450)
UIBC: 197 ug/dL

## 2023-01-07 MED ORDER — POTASSIUM CHLORIDE CRYS ER 20 MEQ PO TBCR: EXTENDED_RELEASE_TABLET | ORAL | 3 refills | Status: DC

## 2023-01-07 MED ORDER — SPIRONOLACTONE 25 MG PO TABS: 12.5000 mg | ORAL_TABLET | Freq: Every day | ORAL | 3 refills | Status: DC

## 2023-01-07 NOTE — Patient Instructions (Addendum)
Re-start Spiro 12.5 mg daily (1/2 tablet). Rx sent to local pharmacy. Take potassium 20 meq each day you take prn Lasix - Rx sent to local pharmacy. Referral sent to Dr. Radford Pax for your sleep apnea and inability to wear CPAP - see below. Labs drawn today - will call you if abnormal. Echo in 6 weeks ordered. Will call you when cleared with your insurance and schedule. Return to see Dr. Haroldine Laws in [facility] in 3 months. Please call us at [phone] in April to schedule this appointment.  Please call us at [phone] if any questions or concerns.

## 2023-01-07 NOTE — Progress Notes (Addendum)
Advanced Heart Failure Team Note    PCP: Dr. Caryl Bis EP: Dr. Quentin Ore HF Cardiologist: Dr. Haroldine Laws  HPI: 72 y.o. female with a hx of CAD s/p anterior STEMI 5/17 with PCI/DES x 2 to LAD, HFrEF/ischemic cardiomyopathy with LVEF at 20-25%, DM2, HTN, HLD, LBBB with CRT-D placement 01/02/22, OSA, and severe mitral regurgitation s/p mTEER with MitraClip 10/04/22.    She underwent successful transcatheter edge-to-edge mitral valve repair 10/04/22 with MitraClip NTW x1 and NT x1 reducing baseline 4+ mitral regurgitation to trace. However. post op echocardiogram 10/05/22. EF 25% with moderate to severe MR felt secondary to SLDA of the second MitraClip placement. Mean gradient at 16mHg with an average HR at 83bpm. RV normal    TCTS consulted and felt to be a candidate for MVR.   Patient underwent MVR with 29 mm Mosaic valve by Dr. WLavonna Monarchon 10/24/22. She returned to the OR shortly after for control of large vessel bleeder on right inferior pericardial. Moderate pericardial and pleural effusions were evacuated. She did well post-op and was stable for discharge 10/30/22 after diuresis and titration of GDMT.   Follow up 1/24, anxious with worse NYHA III symptoms. Volume OK. Echo showed mitral valve prosthesis functioning normally, EF 20-25%, grade I DD, RV ok.  Today she returns for HF follow up. She has SOB walking up steps but does OK walking on flat ground, and feels weak and fatigued. She is chronically dizzy.  Breathing worse when she is anxious. Denies palpitations, CP, abnormal bleeding, edema, or PND/Orthopnea. Appetite ok. No fever or chills. Weight at home 131 pounds.  Uses Lasix every other day. She stopped spiro, says she takes too much meds. Lives at home with her husband who has dementia. She has sleep apnea but did not tolerate CPAP.   ROS: All systems negative except as listed in HPI, PMH and Problem List.  SH:  Social History   Socioeconomic History   Marital status: Married     Spouse name: ANivriti Lores  Number of children: 0   Years of education: BS in education   Highest education level: Not on file  Occupational History   Occupation:  Retired SEducation officer, museum Tobacco Use   Smoking status: Former    Packs/day: 0.50    Years: 25.00    Total pack years: 12.50    Types: Cigarettes    Quit date: 04/13/1997    Years since quitting: 25.7   Smokeless tobacco: Never  Vaping Use   Vaping Use: Never used  Substance and Sexual Activity   Alcohol use: Not Currently   Drug use: No   Sexual activity: Not Currently  Other Topics Concern   Not on file  Social History Narrative   [place]  No children.   [place]   Left-handed   Lives in a two story home       Social Determinants of Health   Financial Resource Strain: Low Risk  (03/30/2022)   Overall Financial Resource Strain (CARDIA)    Difficulty of Paying Living Expenses: Not hard at all  Food Insecurity: No Food Insecurity (10/17/2022)   Hunger Vital Sign    Worried About Running Out of Food in the Last Year: Never true    Ran Out of Food in the Last Year: Never true  Transportation Needs: No Transportation Needs (10/17/2022)   PRAPARE -  Hydrologist (Medical): No    Lack of Transportation (Non-Medical): No  Physical Activity: Not on file  Stress: No Stress Concern Present (03/30/2022)   [place]    Feeling of Stress : Not at all  Social Connections: Unknown (03/30/2022)   Social Connection and Isolation Panel [NHANES]    Frequency of Communication with Friends and Family: Not on file    Frequency of Social Gatherings with Friends and Family: Not on file    Attends Religious Services: Not on file    Active Member of Clubs or Organizations: Not on file    Attends Theatre manager Meetings: Not on file    Marital Status: Married  Intimate Partner Violence: Not At Risk (10/15/2022)   Humiliation, Afraid, Rape, and Kick questionnaire    Fear of Current or Ex-Partner: No    Emotionally Abused: No    Physically Abused: No    Sexually Abused: No    FH:  Family History  Problem Relation Age of Onset   Anxiety disorder Mother    Paranoid behavior Mother    Hypertension Mother    Dementia Mother    High Cholesterol Mother    Diabetes Mother    Hyperlipidemia Mother    Depression Mother    Hypertension Father    High Cholesterol Father    Mood Disorder Sister    Stroke Sister    Anxiety disorder Maternal Aunt    Tuberculosis Paternal Grandfather    Drug abuse Cousin     Past Medical History:  Diagnosis Date   AICD (automatic cardioverter/defibrillator) present    Anemia    Anxiety    Back pain    CAD (coronary artery disease) 04/11/2016   S/p ant STEMI 5/17: LHC >> LAD proximal 80%, mid 80%, distal 50%, ostial D1 60%; LCx with LPDA lesion 30%; RCA Mild calcification with no significant stenosis in a medium caliber, nondominant RCA; LVEF is estimated at 45% with inferoapical and lateral wall akinesis >> PCI: PCI: 3.5 x 24 mm Promus DES to prox LAD, 2.5 x 12 mm Promus DES to mid LAD. // Myoview 7/21: EF 20, no ischemia; high risk    Chest pain    Chronic systolic CHF (congestive heart failure) ([place]) 03/21/2016   Echo 01/30/17: Diff HK, mild focal basal septal hypertrophy, EF 30-35, mild AI, MAC, mild MR // Echo 06/08/16: Mild focal basal septal hypertrophy, EF 25-30%, diff HK, ant-septal AK, Gr 1 DD, mild AI, MAC, mild MR, PASP 37 mmHg // Echo 03/18/16: EF 30-35%, ant-septal AK, Gr 1 DD, mild MR, severe LAE.   Constipation    COVID    February 2022 and Summer 2023 both mild cases   Depression    Dizziness    Dyspnea    Glaucoma    Gout    Heart disease    Heartburn    History of acute anterior wall MI 03/17/2016   History of heart attack     Hyperlipemia    Hypertension    Ischemic cardiomyopathy 10/02/2016   Refused ICD   Joint pain    Left bundle branch block    Myocardial infarction ([place])    2017   Nausea    Positive colorectal cancer screening using Cologuard test 03/01/2020   Pre-diabetes    S/P mitral valve clip implantation 10/04/2022   MitraClip NTWx1 and NTx1 with Dr. Burt Knack and Dr. Ali Lowe   Sleep apnea  SOB (shortness of breath)    Stroke ([place])    2021   Suicidal ideation 08/27/2018   Swelling    feet or legs    Current Outpatient Medications  Medication Sig Dispense Refill   aspirin EC 81 MG tablet Take 1 tablet (81 mg total) by mouth daily. Swallow whole. 30 tablet 12   atorvastatin (LIPITOR) 80 MG tablet Take 80 mg by mouth daily.     Cariprazine HCl (VRAYLAR) 1.5 & 3 MG CPPK Take by mouth as needed.     carvedilol (COREG) 12.5 MG tablet Take 0.5 tablets (6.25 mg total) by mouth 2 (two) times daily with a meal. 60 tablet 1   dorzolamide-timolol (COSOPT) 22.3-6.8 MG/ML ophthalmic solution Place 1 drop into both eyes 2 (two) times daily.      empagliflozin ([name]) 10 MG TABS tablet Take 1 tablet (10 mg total) by mouth daily. 30 tablet 1   furosemide (LASIX) 40 MG tablet Take 40 mg by mouth as needed.     gabapentin (NEURONTIN) 100 MG capsule Take 100 mg by mouth 2 (two) times daily.     irbesartan (AVAPRO) 75 MG tablet Take 1/2 tablet (37.5 mg total) by mouth daily. 30 tablet 1   latanoprost (XALATAN) 0.005 % ophthalmic solution Place 1 drop into both eyes at bedtime.     traZODone (DESYREL) 150 MG tablet Take 150 mg by mouth at bedtime.     warfarin (COUMADIN) 3 MG tablet Take 1 to 2 tablets by mouth daily as directed by [facility] 60 tablet 0   No current facility-administered medications for this encounter.   BP (!) 144/70   Pulse 79   Wt 59.6 kg (131 lb 6.4 oz)   SpO2 100%   BMI 23.28 kg/m   Wt Readings from Last 3 Encounters:  01/07/23 59.6 kg (131 lb 6.4 oz)  01/02/23 56.4 kg  (124 lb 6.4 oz)  12/28/22 59.1 kg (130 lb 3.2 oz)   PHYSICAL EXAM: General:  NAD. No resp difficulty, walked into clinic HEENT: Normal Neck: Supple. No JVD. Carotids 2+ bilat; no bruits. No lymphadenopathy or thryomegaly appreciated. Cor: PMI nondisplaced. Regular rate & rhythm. No rubs, gallops or murmurs. Lungs: Clear Abdomen: Soft, nontender, nondistended. No hepatosplenomegaly. No bruits or masses. Good bowel sounds. Extremities: No cyanosis, clubbing, rash, edema Neuro: Alert & oriented x 3, cranial nerves grossly intact. Moves all 4 extremities w/o difficulty. Affect pleasant.  REDs: 34%  ECG (personally reviewed): AV, BiV paced 77 bpm, QRS 150 msec   ASSESSMENT & PLAN:   1. Mitral regurgitation, severe s/p bioprosthetics MVR  - s/p mTEER with 2 clips on 10/04/22 - Post op echo EF 25% with moderate to severe MR felt secondary to SLDA of the second MitraClip placement. - S/p MVR 12/23 with 29 mm Mosaic valve by Dr. Lavonna Monarch. Returned to OR  for control of bleeding.  - On Warfarin, INR followed by [facility] - Echo (1/24): mitral valve prosthesis functioning normally, stable. EF 20-25%, mild to moderate mitral stenosis, mean valve gradient 6.0 mmHg   2. Chronic systolic HF - initially felt due to ischemic CM. S/p Anterior STEMI 5/17. Now suspect MR major contributing factor. Has LBBB. EF did not improve despite CRT-D - Echo (8/19): EF 30-35%. - cMRI 1/23 EF 15% No LGE. RV ok. Severe LV dyssynchrony Moderate MR  - CRT-D placed 01/02/22 with LBBB lead by Dr. Quentin Ore - Echo 07/17/22 EF 20-25% G2DD, RV ok. Severe central MR  - s/p mTEER  with 2 clips on 10/04/22 - Echo 10/05/22: EF 25% with moderate to severe MR felt secondary to SLDA of the second MitraClip placement.  - S/p MVR 10/24/22 - Echo (1/24): EF 20-25%, grade I DD, RV ok. Mitral Valve prosthesis ok - NYHA III, worse recently. volume status looks ok, ReDS 34% - Restart spiro 12.5 mg daily. - Continue Coreg 6.25 mg  bid - Continue avapro 37.5 mg daily - Continue [name] 10 mg daily - Continue Lasix PRN, take 20 KCL when taking Lasix. - Check labs. - Repeat echo in 4-6 weeks, to see if there is EF improvement post MVR. - She does not appear low output, but with worsening symptoms, we discussed repeating RHC sooner rather than later. She understands, will hold off for now.  3. Coronary artery disease involving native coronary artery of native heart without angina pectoris - Hx of anterior STEMI in 5/17 tx with DES x 2 to the LAD.   - No LGE on cMRI (1/23) - LHC 9/23 patent LAD stents - No chest pain - Continue ASA + statin.  4. LBBB - s/p CRT-D 3/23 with Dr. Quentin Ore  - EF has not improved. - QRS 150 msec on ECG today.   5. Orthostatic hypotension - Improved.  6. OSA - Unable to tolerate CPAP - Refer to Dr. Radford Pax to discuss options.  7. Fatigue - Likely multifactorial - Recent TSH ok - Check iron panel  Follow up in 3 months with Dr. Haroldine Laws.  Rafael Bihari, FNP  11:22 AM

## 2023-01-07 NOTE — Progress Notes (Signed)
ReDS Vest / Clip - 01/07/23 1100       ReDS Vest / Clip   Station Marker A    Ruler Value 25    ReDS Value Range Low volume    ReDS Actual Value 34

## 2023-01-08 ENCOUNTER — Telehealth (HOSPITAL_COMMUNITY): Payer: Self-pay

## 2023-01-08 ENCOUNTER — Other Ambulatory Visit (HOSPITAL_COMMUNITY): Payer: Self-pay

## 2023-01-08 DIAGNOSIS — I5022 Chronic systolic (congestive) heart failure: Secondary | ICD-10-CM

## 2023-01-08 NOTE — Telephone Encounter (Signed)
Spoke and advised patient of her results.She also wants her labs drawn at [facility]. Lab order has been placed and patient agreed understanding.

## 2023-01-09 NOTE — Progress Notes (Signed)
Patient was called and is scheduled in 3 months to see provider.  Mckinlee Dunk,cma

## 2023-01-10 DIAGNOSIS — Z79899 Other long term (current) drug therapy: Secondary | ICD-10-CM | POA: Diagnosis not present

## 2023-01-10 DIAGNOSIS — F251 Schizoaffective disorder, depressive type: Secondary | ICD-10-CM | POA: Diagnosis not present

## 2023-01-16 ENCOUNTER — Other Ambulatory Visit: Payer: Self-pay | Admitting: Internal Medicine

## 2023-01-17 ENCOUNTER — Ambulatory Visit: Payer: Medicare Other | Attending: Physician Assistant | Admitting: Physician Assistant

## 2023-01-17 ENCOUNTER — Encounter: Payer: Self-pay | Admitting: Physician Assistant

## 2023-01-17 ENCOUNTER — Other Ambulatory Visit: Payer: Self-pay

## 2023-01-17 VITALS — BP 112/68 | HR 84 | Ht 63.0 in | Wt 132.4 lb

## 2023-01-17 DIAGNOSIS — I951 Orthostatic hypotension: Secondary | ICD-10-CM

## 2023-01-17 DIAGNOSIS — Z952 Presence of prosthetic heart valve: Secondary | ICD-10-CM | POA: Diagnosis not present

## 2023-01-17 DIAGNOSIS — R5383 Other fatigue: Secondary | ICD-10-CM | POA: Diagnosis not present

## 2023-01-17 DIAGNOSIS — I2510 Atherosclerotic heart disease of native coronary artery without angina pectoris: Secondary | ICD-10-CM

## 2023-01-17 DIAGNOSIS — I5022 Chronic systolic (congestive) heart failure: Secondary | ICD-10-CM | POA: Insufficient documentation

## 2023-01-17 DIAGNOSIS — I447 Left bundle-branch block, unspecified: Secondary | ICD-10-CM

## 2023-01-17 NOTE — Patient Instructions (Signed)
Medication Instructions:  Your physician recommends that you continue on your current medications as directed. Please refer to the Current Medication list given to you today.  *If you need a refill on your cardiac medications before your next appointment, please call your pharmacy*  Lab Work: Labs as scheduled If you have labs (blood work) drawn today and your tests are completely normal, you will receive your results only by: [place] (if you have MyChart) OR A paper copy in the mail If you have any lab test that is abnormal or we need to change your treatment, we will call you to review the results.  Testing/Procedures: Your physician has requested that you have an echocardiogram in 1 month. Echocardiography is a painless test that uses sound waves to create images of your heart. It provides your doctor with information about the size and shape of your heart and how well your heart's chambers and valves are working. This procedure takes approximately one hour. There are no restrictions for this procedure. Please do NOT wear cologne, perfume, aftershave, or lotions (deodorant is allowed). Please arrive 15 minutes prior to your appointment time.   Follow-Up: At [facility], you and your health needs are our priority.  As part of our continuing mission to provide you with exceptional heart care, we have created designated Provider Care Teams.  These Care Teams include your primary Cardiologist (physician) and Advanced Practice Providers (APPs -  Physician Assistants and Nurse Practitioners) who all work together to provide you with the care you need, when you need it.  Your next appointment:   03/13/23 at 4:00 PM  Provider:   Dr Radford Pax  Schedule 2 month follow up with the CHF clinic  Other Instructions Weigh every morning after using the restroom, before breakfast and call and let us know if you have a weight gain of 2 lbs or more overnight or 5 lbs or more in a  week  Low-Sodium Eating Plan Sodium, which is an element that makes up salt, helps you maintain a healthy balance of fluids in your body. Too much sodium can increase your blood pressure and cause fluid and waste to be held in your body. Your health care provider or dietitian may recommend following this plan if you have high blood pressure (hypertension), kidney disease, liver disease, or heart failure. Eating less sodium can help lower your blood pressure, reduce swelling, and protect your heart, liver, and kidneys. What are tips for following this plan? Reading food labels The Nutrition Facts label lists the amount of sodium in one serving of the food. If you eat more than one serving, you must multiply the listed amount of sodium by the number of servings. Choose foods with less than 140 mg of sodium per serving. Avoid foods with 300 mg of sodium or more per serving. Shopping  Look for lower-sodium products, often labeled as "low-sodium" or "no salt added." Always check the sodium content, even if foods are labeled as "unsalted" or "no salt added." Buy fresh foods. Avoid canned foods and pre-made or frozen meals. Avoid canned, cured, or processed meats. Buy breads that have less than 80 mg of sodium per slice. Cooking  Eat more home-cooked food and less restaurant, buffet, and fast food. Avoid adding salt when cooking. Use salt-free seasonings or herbs instead of table salt or sea salt. Check with your health care provider or pharmacist before using salt substitutes. Cook with plant-based oils, such as canola, sunflower, or olive oil. Meal planning  When eating at a restaurant, ask that your food be prepared with less salt or no salt, if possible. Avoid dishes labeled as brined, pickled, cured, smoked, or made with soy sauce, miso, or teriyaki sauce. Avoid foods that contain MSG (monosodium glutamate). MSG is sometimes added to [place] food, bouillon, and some canned foods. Make meals  that can be grilled, baked, poached, roasted, or steamed. These are generally made with less sodium. General information Most people on this plan should limit their sodium intake to 1,500-2,000 mg (milligrams) of sodium each day. What foods should I eat? Fruits Fresh, frozen, or canned fruit. Fruit juice. Vegetables Fresh or frozen vegetables. "No salt added" canned vegetables. "No salt added" tomato sauce and paste. Low-sodium or reduced-sodium tomato and vegetable juice. Grains Low-sodium cereals, including oats, puffed wheat and rice, and shredded wheat. Low-sodium crackers. Unsalted rice. Unsalted pasta. Low-sodium bread. Whole-grain breads and whole-grain pasta. Meats and other proteins Fresh or frozen (no salt added) meat, poultry, seafood, and fish. Low-sodium canned tuna and salmon. Unsalted nuts. Dried peas, beans, and lentils without added salt. Unsalted canned beans. Eggs. Unsalted nut butters. Dairy Milk. Soy milk. Cheese that is naturally low in sodium, such as ricotta cheese, fresh mozzarella, or Swiss cheese. Low-sodium or reduced-sodium cheese. Cream cheese. Yogurt. Seasonings and condiments Fresh and dried herbs and spices. Salt-free seasonings. Low-sodium mustard and ketchup. Sodium-free salad dressing. Sodium-free light mayonnaise. Fresh or refrigerated horseradish. Lemon juice. Vinegar. Other foods Homemade, reduced-sodium, or low-sodium soups. Unsalted popcorn and pretzels. Low-salt or salt-free chips. The items listed above may not be a complete list of foods and beverages you can eat. Contact a dietitian for more information. What foods should I avoid? Vegetables Sauerkraut, pickled vegetables, and relishes. Olives. [place] fries. Onion rings. Regular canned vegetables (not low-sodium or reduced-sodium). Regular canned tomato sauce and paste (not low-sodium or reduced-sodium). Regular tomato and vegetable juice (not low-sodium or reduced-sodium). Frozen vegetables in  sauces. Grains Instant hot cereals. Bread stuffing, pancake, and biscuit mixes. Croutons. Seasoned rice or pasta mixes. Noodle soup cups. Boxed or frozen macaroni and cheese. Regular salted crackers. Self-rising flour. Meats and other proteins Meat or fish that is salted, canned, smoked, spiced, or pickled. Precooked or cured meat, such as sausages or meat loaves. [place] Salines. Ham. Pepperoni. Hot dogs. Corned beef. Chipped beef. Salt pork. Jerky. Pickled herring. Anchovies and sardines. Regular canned tuna. Salted nuts. Dairy Processed cheese and cheese spreads. Hard cheeses. Cheese curds. Blue cheese. Feta cheese. String cheese. Regular cottage cheese. Buttermilk. Canned milk. Fats and oils Salted butter. Regular margarine. Ghee. Bacon fat. Seasonings and condiments Onion salt, garlic salt, seasoned salt, table salt, and sea salt. Canned and packaged gravies. Worcestershire sauce. Tartar sauce. Barbecue sauce. Teriyaki sauce. Soy sauce, including reduced-sodium. Steak sauce. Fish sauce. Oyster sauce. Cocktail sauce. Horseradish that you find on the shelf. Regular ketchup and mustard. Meat flavorings and tenderizers. Bouillon cubes. Hot sauce. Pre-made or packaged marinades. Pre-made or packaged taco seasonings. Relishes. Regular salad dressings. Salsa. Other foods Salted popcorn and pretzels. Corn chips and puffs. Potato and tortilla chips. Canned or dried soups. Pizza. Frozen entrees and pot pies. The items listed above may not be a complete list of foods and beverages you should avoid. Contact a dietitian for more information. Summary Eating less sodium can help lower your blood pressure, reduce swelling, and protect your heart, liver, and kidneys. Most people on this plan should limit their sodium intake to 1,500-2,000 mg (milligrams) of sodium each day. Canned, boxed, and  frozen foods are high in sodium. Restaurant foods, fast foods, and pizza are also very high in sodium. You also get sodium by  adding salt to food. Try to cook at home, eat more fresh fruits and vegetables, and eat less fast food and canned, processed, or prepared foods. This information is not intended to replace advice given to you by your health care provider. Make sure you discuss any questions you have with your health care provider. Document Revised: 09/28/2019 Document Reviewed: 09/23/2019 Elsevier Patient Education  [place] Many factors influence your heart health, including eating and exercise habits. Heart health is also called coronary health. Coronary risk increases with abnormal blood fat (lipid) levels. A heart-healthy eating plan includes limiting unhealthy fats, increasing healthy fats, limiting salt (sodium) intake, and making other diet and lifestyle changes. What is my plan? Your health care provider may recommend that: You limit your fat intake to _________% or less of your total calories each day. You limit your saturated fat intake to _________% or less of your total calories each day. You limit the amount of cholesterol in your diet to less than _________ mg per day. You limit the amount of sodium in your diet to less than _________ mg per day. What are tips for following this plan? Cooking Cook foods using methods other than frying. Baking, boiling, grilling, and broiling are all good options. Other ways to reduce fat include: Removing the skin from poultry. Removing all visible fats from meats. Steaming vegetables in water or broth. Meal planning  At meals, imagine dividing your plate into fourths: Fill one-half of your plate with vegetables and green salads. Fill one-fourth of your plate with whole grains. Fill one-fourth of your plate with lean protein foods. Eat 2-4 cups of vegetables per day. One cup of vegetables equals 1 cup (91 g) broccoli or cauliflower florets, 2 medium carrots, 1 large bell pepper, 1 large sweet potato, 1 large tomato, 1  medium white potato, 2 cups (150 g) raw leafy greens. Eat 1-2 cups of fruit per day. One cup of fruit equals 1 small apple, 1 large banana, 1 cup (237 g) mixed fruit, 1 large orange,  cup (82 g) dried fruit, 1 cup (240 mL) 100% fruit juice. Eat more foods that contain soluble fiber. Examples include apples, broccoli, carrots, beans, peas, and barley. Aim to get 25-30 g of fiber per day. Increase your consumption of legumes, nuts, and seeds to 4-5 servings per week. One serving of dried beans or legumes equals  cup (90 g) cooked, 1 serving of nuts is  oz (12 almonds, 24 pistachios, or 7 walnut halves), and 1 serving of seeds equals  oz (8 g). Fats Choose healthy fats more often. Choose monounsaturated and polyunsaturated fats, such as olive and canola oils, avocado oil, flaxseeds, walnuts, almonds, and seeds. Eat more omega-3 fats. Choose salmon, mackerel, sardines, tuna, flaxseed oil, and ground flaxseeds. Aim to eat fish at least 2 times each week. Check food labels carefully to identify foods with trans fats or high amounts of saturated fat. Limit saturated fats. These are found in animal products, such as meats, butter, and cream. Plant sources of saturated fats include palm oil, palm kernel oil, and coconut oil. Avoid foods with partially hydrogenated oils in them. These contain trans fats. Examples are stick margarine, some tub margarines, cookies, crackers, and other baked goods. Avoid fried foods. General information Eat more home-cooked food and less restaurant, buffet, and fast  food. Limit or avoid alcohol. Limit foods that are high in added sugar and simple starches such as foods made using white refined flour (white breads, pastries, sweets). Lose weight if you are overweight. Losing just 5-10% of your body weight can help your overall health and prevent diseases such as diabetes and heart disease. Monitor your sodium intake, especially if you have high blood pressure. Talk with your  health care provider about your sodium intake. Try to incorporate more vegetarian meals weekly. What foods should I eat? Fruits All fresh, canned (in natural juice), or frozen fruits. Vegetables Fresh or frozen vegetables (raw, steamed, roasted, or grilled). Green salads. Grains Most grains. Choose whole wheat and whole grains most of the time. Rice and pasta, including brown rice and pastas made with whole wheat. Meats and other proteins Lean, well-trimmed beef, veal, pork, and lamb. Chicken and [place] without skin. All fish and shellfish. Wild duck, rabbit, pheasant, and venison. Egg whites or low-cholesterol egg substitutes. Dried beans, peas, lentils, and tofu. Seeds and most nuts. Dairy Low-fat or nonfat cheeses, including ricotta and mozzarella. Skim or 1% milk (liquid, powdered, or evaporated). Buttermilk made with low-fat milk. Nonfat or low-fat yogurt. Fats and oils Non-hydrogenated (trans-free) margarines. Vegetable oils, including soybean, sesame, sunflower, olive, avocado, peanut, safflower, corn, canola, and cottonseed. Salad dressings or mayonnaise made with a vegetable oil. Beverages Water (mineral or sparkling). Coffee and tea. Unsweetened ice tea. Diet beverages. Sweets and desserts Sherbet, gelatin, and fruit ice. Small amounts of dark chocolate. Limit all sweets and desserts. Seasonings and condiments All seasonings and condiments. The items listed above may not be a complete list of foods and beverages you can eat. Contact a dietitian for more options. What foods should I avoid? Fruits Canned fruit in heavy syrup. Fruit in cream or butter sauce. Fried fruit. Limit coconut. Vegetables Vegetables cooked in cheese, cream, or butter sauce. Fried vegetables. Grains Breads made with saturated or trans fats, oils, or whole milk. Croissants. Sweet rolls. Donuts. High-fat crackers, such as cheese crackers and chips. Meats and other proteins Fatty meats, such as hot dogs,  ribs, sausage, bacon, rib-eye roast or steak. High-fat deli meats, such as salami and bologna. Caviar. Domestic duck and goose. Organ meats, such as liver. Dairy Cream, sour cream, cream cheese, and creamed cottage cheese. Whole-milk cheeses. Whole or 2% milk (liquid, evaporated, or condensed). Whole buttermilk. Cream sauce or high-fat cheese sauce. Whole-milk yogurt. Fats and oils Meat fat, or shortening. Cocoa butter, hydrogenated oils, palm oil, coconut oil, palm kernel oil. Solid fats and shortenings, including bacon fat, salt pork, lard, and butter. Nondairy cream substitutes. Salad dressings with cheese or sour cream. Beverages Regular sodas and any drinks with added sugar. Sweets and desserts Frosting. Pudding. Cookies. Cakes. Pies. Milk chocolate or white chocolate. Buttered syrups. Full-fat ice cream or ice cream drinks. The items listed above may not be a complete list of foods and beverages to avoid. Contact a dietitian for more information. Summary Heart-healthy meal planning includes limiting unhealthy fats, increasing healthy fats, limiting salt (sodium) intake and making other diet and lifestyle changes. Lose weight if you are overweight. Losing just 5-10% of your body weight can help your overall health and prevent diseases such as diabetes and heart disease. Focus on eating a balance of foods, including fruits and vegetables, low-fat or nonfat dairy, lean protein, nuts and legumes, whole grains, and heart-healthy oils and fats. This information is not intended to replace advice given to you by your health care  provider. Make sure you discuss any questions you have with your health care provider. Document Revised: 11/27/2021 Document Reviewed: 11/27/2021 Elsevier Patient Education  [place].

## 2023-01-17 NOTE — Progress Notes (Signed)
Office Visit    Patient Name: Melanie Ray Date of Encounter: 01/17/2023  PCP:  Leone [facility], [facility]  Cardiologist:  Sherren Mocha, MD  Advanced Practice Provider:  Liliane Shi, PA-C Electrophysiologist:  Vickie Epley, MD   HPI    Melanie Ray is a 72 y.o. female with past medical history of CAD status post anterior STEMI 5/17 with PCI/DES x 2 to LAD, HFrEF/ischemic cardiomyopathy with LVEF 20 to 25%, DM 2, HTN, HLD, LBBB with CRT-D placement 01/02/2022, OSA, severe mitral regurgitation status post mTEER with MitraClip 10/04/2022 presents today for follow-up visit.  Patient underwent successful transcatheter edge-to-edge mitral valve repair 10/04/2022 with MitraClip NT W x 1 and NT x 1 reducing baseline 4+ mitral regurgitation to trace.  However, postop echocardiogram 10/05/2022 with EF 25% with moderate to severe MR felt secondary to SL DA of the second MitraClip placement.  Mean gradient at 9 mmHg with an average heart rate of 83 bpm.  RV normal.  T CTS was consulted and felt to be candidate for MVR.  Patient underwent MVR with 29 mm Mosaic valve by Dr. Lavonna Monarch on 10/24/2022.  Return to the OR shortly after for control of large vessel bleeder on the right inferior pericardium.  Moderate pericardial and pleural effusions were evacuated.  Did well postop and was stable for discharge 10/30/2022 after diuresis and titration of GDMT.  Follow-up 1/24, anxious with worse NYHA III symptoms.  Volume was okay at that time.  Echo showed mitral valve prosthesis functioning normally, EF 20 to 25%, grade 1 DD, RV okay.  She was seen by the congestive heart failure team 01/07/2023 and at that time was having some shortness of breath walking up the stairs but was doing okay walking on flat ground.  She endorsed feeling weak and fatigued.  Chronic dizziness.  Breathing was worse when she was anxious.  Denied palpitations, chest pain, abnormal  bleeding, edema, or PND/orthopnea.  Appetite okay.  No fevers or chills.  Weight was 131 pounds.  Uses Lasix every day.  Stop Arlyce Harman because she felt like she was taking too many meds.  She lives at home with her husband who has dementia.  Has sleep apnea but did not tolerate CPAP.  Today, she tells me that she is taking Lasix every other day and has started taking spironolactone 12.5 mg daily.  No real improvement in shortness of breath and feels about the same as she felt of the heart failure clinic early March.  Weight has been stable.  Euvolemic on exam.  No lower extremity swelling.  She has labs scheduled for next week.  We discussed scheduling a follow-up echocardiogram for the beginning of April in [place] which is closer for her.  Still having dizziness at times when she stands up too quickly but does not get dizzy with driving.  She drove herself to her appointment today.  Reports no chest pain, pressure, or tightness. No edema, orthopnea, PND. Reports no palpitations.    Past Medical History    Past Medical History:  Diagnosis Date   AICD (automatic cardioverter/defibrillator) present    Anemia    Anxiety    Back pain    CAD (coronary artery disease) 04/11/2016   S/p ant STEMI 5/17: LHC >> LAD proximal 80%, mid 80%, distal 50%, ostial D1 60%; LCx with LPDA lesion 30%; RCA Mild calcification with no significant stenosis in a medium caliber, nondominant RCA; LVEF is  estimated at 45% with inferoapical and lateral wall akinesis >> PCI: PCI: 3.5 x 24 mm Promus DES to prox LAD, 2.5 x 12 mm Promus DES to mid LAD. // Myoview 7/21: EF 20, no ischemia; high risk    Chest pain    Chronic systolic CHF (congestive heart failure) ([place]) 03/21/2016   Echo 01/30/17: Diff HK, mild focal basal septal hypertrophy, EF 30-35, mild AI, MAC, mild MR // Echo 06/08/16: Mild focal basal septal hypertrophy, EF 25-30%, diff HK, ant-septal AK, Gr 1 DD, mild AI, MAC, mild MR, PASP 37 mmHg // Echo 03/18/16: EF 30-35%,  ant-septal AK, Gr 1 DD, mild MR, severe LAE.   Constipation    COVID    February 2022 and Summer 2023 both mild cases   Depression    Dizziness    Dyspnea    Glaucoma    Gout    Heart disease    Heartburn    History of acute anterior wall MI 03/17/2016   History of heart attack    Hyperlipemia    Hypertension    Ischemic cardiomyopathy 10/02/2016   Refused ICD   Joint pain    Left bundle branch block    Myocardial infarction ([place])    2017   Nausea    Positive colorectal cancer screening using Cologuard test 03/01/2020   Pre-diabetes    S/P mitral valve clip implantation 10/04/2022   MitraClip NTWx1 and NTx1 with Dr. Burt Knack and Dr. Ali Lowe   Sleep apnea    SOB (shortness of breath)    Stroke ([place])    2021   Suicidal ideation 08/27/2018   Swelling    feet or legs   Past Surgical History:  Procedure Laterality Date   APPENDECTOMY     BIV ICD INSERTION CRT-D N/A 01/02/2022   Procedure: BIV ICD INSERTION CRT-D;  Surgeon: Vickie Epley, MD;  Location: [place] [facility];  Service: Cardiovascular;  Laterality: N/A;   CARDIAC CATHETERIZATION N/A 03/17/2016   Procedure: Left Heart Cath and Coronary Angiography;  Surgeon: Sherren Mocha, MD;  Location: [place] [facility];  Service: Cardiovascular;  Laterality: N/A;   CARDIAC CATHETERIZATION N/A 03/17/2016   Procedure: Coronary Stent Intervention;  Surgeon: Sherren Mocha, MD;  Location: [place] [facility];  Service: Cardiovascular;  Laterality: N/A;   COLONOSCOPY WITH PROPOFOL N/A 08/13/2017   Procedure: COLONOSCOPY WITH PROPOFOL;  Surgeon: Jonathon Bellows, MD;  Location: [facility] ENDOSCOPY;  Service: Gastroenterology;  Laterality: N/A;   COLONOSCOPY WITH PROPOFOL N/A 03/29/2020   Procedure: COLONOSCOPY WITH PROPOFOL;  Surgeon: Lucilla Lame, MD;  Location: [facility] ENDOSCOPY;  Service: Endoscopy;  Laterality: N/A;   EXPLORATION POST OPERATIVE OPEN HEART N/A 10/24/2022   Procedure: EXPLORATION POST OPERATIVE OPEN HEART;  Surgeon: Coralie Common, MD;  Location: [place];  Service: Open Heart Surgery;  Laterality: N/A;   MITRAL VALVE REPAIR N/A 10/04/2022   Procedure: MITRAL VALVE REPAIR;  Surgeon: Sherren Mocha, MD;  Location: [place] [facility];  Service: Cardiovascular;  Laterality: N/A;   MITRAL VALVE REPLACEMENT N/A 10/24/2022   Procedure: MITRAL VALVE (MV) REPLACEMENT USING A 29 MM MEDTRONIC MOSAIC 310;  Surgeon: Coralie Common, MD;  Location: [place];  Service: Open Heart Surgery;  Laterality: N/A;   RIGHT HEART CATH N/A 10/15/2022   Procedure: RIGHT HEART CATH;  Surgeon: Sherren Mocha, MD;  Location: [place] [facility];  Service: Cardiovascular;  Laterality: N/A;   RIGHT/LEFT HEART CATH AND CORONARY ANGIOGRAPHY N/A 08/02/2022   Procedure: RIGHT/LEFT HEART CATH AND  CORONARY ANGIOGRAPHY;  Surgeon: Jolaine Artist, MD;  Location: [place] [facility];  Service: Cardiovascular;  Laterality: N/A;   SMALL BOWEL REPAIR     TEE WITHOUT CARDIOVERSION N/A 08/02/2022   Procedure: TRANSESOPHAGEAL ECHOCARDIOGRAM (TEE);  Surgeon: Jolaine Artist, MD;  Location[facility] ENDOSCOPY;  Service: Cardiovascular;  Laterality: N/A;   TEE WITHOUT CARDIOVERSION N/A 10/04/2022   Procedure: TRANSESOPHAGEAL ECHOCARDIOGRAM (TEE);  Surgeon: Sherren Mocha, MD;  Location: [place] [facility];  Service: Cardiovascular;  Laterality: N/A;   TEE WITHOUT CARDIOVERSION N/A 10/24/2022   Procedure: TRANSESOPHAGEAL ECHOCARDIOGRAM (TEE);  Surgeon: Coralie Common, MD;  Location: [place];  Service: Open Heart Surgery;  Laterality: N/A;   TONSILLECTOMY     UTERINE FIBROID SURGERY      Allergies  Allergies  Allergen Reactions   Tetracycline Swelling   Meperidine Nausea And Vomiting    (Demerol)Nausea   Entresto [Sacubitril-Valsartan] Other (See Comments)    Shortness of Breath    EKGs/Labs/Other Studies Reviewed:   The following studies were reviewed today:  Echocardiogram 11/28/2022 IMPRESSIONS     1. Left ventricular ejection fraction, by estimation, is 20  to 25%. The  left ventricle has severely decreased function. The left ventricle  demonstrates global hypokinesis. The left ventricular internal cavity size  was moderately dilated. Left  ventricular diastolic parameters are consistent with Grade I diastolic  dysfunction (impaired relaxation).   2. Right ventricular systolic function is normal. The right ventricular  size is normal.   3. Left atrial size was moderately dilated.   4. The mitral valve has been repaired/replaced. No evidence of mitral  valve regurgitation. Mild to moderate mitral stenosis. The mean mitral  valve gradient is 6.0 mmHg. Echo findings are consistent with normal  structure and function of the mitral valve  prosthesis.   5. The aortic valve was not well visualized. Aortic valve regurgitation  is not visualized. Aortic valve sclerosis/calcification is present,  without any evidence of aortic stenosis. Aortic valve area, by VTI  measures 2.13 cm. Aortic valve mean gradient  measures 4.0 mmHg. Aortic valve Vmax measures 1.38 m/s.   6. The inferior vena cava is normal in size with greater than 50%  respiratory variability, suggesting right atrial pressure of 3 mmHg.   FINDINGS   Left Ventricle: Left ventricular ejection fraction, by estimation, is 20  to 25%. The left ventricle has severely decreased function. The left  ventricle demonstrates global hypokinesis. The left ventricular internal  cavity size was moderately dilated.  There is no left ventricular hypertrophy. Left ventricular diastolic  parameters are consistent with Grade I diastolic dysfunction (impaired  relaxation).   Right Ventricle: The right ventricular size is normal. No increase in  right ventricular wall thickness. Right ventricular systolic function is  normal.   Left Atrium: Left atrial size was moderately dilated.   Right Atrium: Right atrial size was normal in size.   Pericardium: There is no evidence of pericardial effusion.    Mitral Valve: The mitral valve has been repaired/replaced. No evidence of  mitral valve regurgitation. There is a Medtronic bioprosthetic valve  present in the mitral position. Echo findings are consistent with normal  structure and function of the mitral  valve prosthesis. Mild to moderate mitral valve stenosis. MV peak  gradient, 12.8 mmHg. The mean mitral valve gradient is 6.0 mmHg.   Tricuspid Valve: The tricuspid valve is normal in structure. Tricuspid  valve regurgitation is trivial. No evidence of tricuspid stenosis.   Aortic Valve: The aortic  valve was not well visualized. Aortic valve  regurgitation is not visualized. Aortic valve sclerosis/calcification is  present, without any evidence of aortic stenosis. Aortic valve mean  gradient measures 4.0 mmHg. Aortic valve  peak gradient measures 7.6 mmHg. Aortic valve area, by VTI measures 2.13  cm.   Pulmonic Valve: The pulmonic valve was normal in structure. Pulmonic valve  regurgitation is not visualized. No evidence of pulmonic stenosis.   Aorta: The aortic root is normal in size and structure.   Venous: The inferior vena cava is normal in size with greater than 50%  respiratory variability, suggesting right atrial pressure of 3 mmHg.   IAS/Shunts: No atrial level shunt detected by color flow Doppler.   Additional Comments: A device lead is visualized.    EKG:  EKG is not ordered today.    Recent Labs: 10/10/2022: NT-Pro BNP 9,776 10/25/2022: Magnesium 2.3 12/28/2022: ALT 13; TSH 0.79 01/07/2023: B Natriuretic Peptide 1,367.3; BUN 11; Creatinine, Ser 1.00; Hemoglobin 11.3; Platelets 356; Potassium 3.6; Sodium 141  Recent Lipid Panel    Component Value Date/Time   CHOL 139 12/19/2021 1107   CHOL 112 04/18/2020 0815   TRIG 71.0 12/19/2021 1107   HDL 41.60 12/19/2021 1107   HDL 39 (L) 04/18/2020 0815   CHOLHDL 3 12/19/2021 1107   VLDL 14.2 12/19/2021 1107   LDLCALC 83 12/19/2021 1107   LDLCALC 59 04/18/2020 0815    LDLDIRECT 78.0 02/07/2018 0928    Home Medications   Current Meds  Medication Sig   aspirin EC 81 MG tablet Take 1 tablet (81 mg total) by mouth daily. Swallow whole.   atorvastatin (LIPITOR) 80 MG tablet Take 1 tablet by mouth once daily   Cariprazine HCl (VRAYLAR) 1.5 & 3 MG CPPK Take by mouth as needed.   carvedilol (COREG) 12.5 MG tablet Take 0.5 tablets (6.25 mg total) by mouth 2 (two) times daily with a meal.   dorzolamide-timolol (COSOPT) 22.3-6.8 MG/ML ophthalmic solution Place 1 drop into both eyes 2 (two) times daily.    empagliflozin ([name]) 10 MG TABS tablet Take 1 tablet (10 mg total) by mouth daily.   furosemide (LASIX) 40 MG tablet Take 40 mg by mouth as needed.   gabapentin (NEURONTIN) 100 MG capsule Take 100 mg by mouth 2 (two) times daily.   irbesartan (AVAPRO) 75 MG tablet Take 1/2 tablet (37.5 mg total) by mouth daily.   latanoprost (XALATAN) 0.005 % ophthalmic solution Place 1 drop into both eyes at bedtime.   potassium chloride SA (KLOR-CON M) 20 MEQ tablet Take one tablet when you take prn Lasix   spironolactone (ALDACTONE) 25 MG tablet Take 0.5 tablets (12.5 mg total) by mouth daily.   traZODone (DESYREL) 150 MG tablet Take 150 mg by mouth at bedtime.   warfarin (COUMADIN) 3 MG tablet Take 1 to 2 tablets by mouth daily as directed by Coumadin Clinic     Review of Systems      All other systems reviewed and are otherwise negative except as noted above.  Physical Exam    VS:  BP 112/68   Pulse 84   Ht '5\' 3"'$  (1.6 m)   Wt 132 lb 6.4 oz (60.1 kg)   SpO2 97%   BMI 23.45 kg/m  , BMI Body mass index is 23.45 kg/m.  Wt Readings from Last 3 Encounters:  01/17/23 132 lb 6.4 oz (60.1 kg)  01/07/23 131 lb 6.4 oz (59.6 kg)  01/02/23 124 lb 6.4 oz (56.4 kg)  GEN: Well nourished, well developed, in no acute distress. HEENT: normal. Neck: Supple, no JVD, carotid bruits, or masses. Cardiac: RRR, no murmurs, rubs, or gallops. No clubbing, cyanosis, edema.   Radials/PT 2+ and equal bilaterally.  Respiratory:  Respirations regular and unlabored, clear to auscultation bilaterally. GI: Soft, nontender, nondistended. MS: No deformity or atrophy. Skin: Warm and dry, no rash. Neuro:  Strength and sensation are intact. Psych: Normal affect.  Assessment & Plan    Mitral regurgitation, severe status post bioprosthetic MVR -Follow-up echocardiogram planned for early April -Continue current medications which include aspirin 81 mg daily, Lipitor 80 mg daily, Coreg 6.25 mg twice daily, [name] 10 mg daily, Lasix 40 mg every other day, every losartan 37.5 mg daily, spironolactone 12.5 mg daily, Coumadin as directed by the Coumadin clinic -She had shortness of breath with the initiation of Entresto  Chronic systolic heart failure -Continue current medications -Follow-up plan with the heart failure clinic -Follow-up echocardiogram in a few weeks to reevaluate EF  CAD -no chest pains -Cardiac catheterization 07/2022 with nonocclusive CAD -Continue current medication  LBBB -stable  Orthostatic hypotension -she has chronic dizziness and lightheadedness. No syncope -positional -stable  OSA -no CPAP -follow-up arranged with Dr. Radford Pax          Cardiac Rehabilitation Eligibility Assessment     Disposition: Follow up 3-4 months with Sherren Mocha, MD or APP.  Signed, Elgie Collard, PA-C 01/17/2023, 4:47 PM  [facility]

## 2023-01-23 ENCOUNTER — Ambulatory Visit
Payer: Medicare Other | Attending: Internal Medicine | Admitting: Pharmacist Clinician (PhC)/ Clinical Pharmacy Specialist

## 2023-01-23 DIAGNOSIS — I639 Cerebral infarction, unspecified: Secondary | ICD-10-CM

## 2023-01-23 DIAGNOSIS — Z7901 Long term (current) use of anticoagulants: Secondary | ICD-10-CM | POA: Diagnosis not present

## 2023-01-23 DIAGNOSIS — Z952 Presence of prosthetic heart valve: Secondary | ICD-10-CM | POA: Diagnosis not present

## 2023-01-23 LAB — POCT INR: INR: 1.6 — AB (ref 2.0–3.0)

## 2023-01-23 NOTE — Patient Instructions (Signed)
Take 2.5 tablets (7.5 mg) today Wednesday March 20, then increase dose to 2 tablets daily except 1.5 tablets on Fridays Recheck INR in 2 weeks.  Be consistent with 2 serving of greens per week.  Coumadin Clinic [phone]

## 2023-01-25 ENCOUNTER — Other Ambulatory Visit: Payer: Self-pay | Admitting: Internal Medicine

## 2023-01-25 DIAGNOSIS — Z5181 Encounter for therapeutic drug level monitoring: Secondary | ICD-10-CM

## 2023-01-25 DIAGNOSIS — Z952 Presence of prosthetic heart valve: Secondary | ICD-10-CM

## 2023-01-25 DIAGNOSIS — I639 Cerebral infarction, unspecified: Secondary | ICD-10-CM

## 2023-01-28 ENCOUNTER — Encounter: Payer: Medicare Other | Attending: Cardiovascular Disease | Admitting: *Deleted

## 2023-01-28 VITALS — Ht 63.0 in | Wt 132.1 lb

## 2023-01-28 DIAGNOSIS — I5022 Chronic systolic (congestive) heart failure: Secondary | ICD-10-CM | POA: Diagnosis not present

## 2023-01-28 DIAGNOSIS — Z952 Presence of prosthetic heart valve: Secondary | ICD-10-CM | POA: Diagnosis not present

## 2023-01-28 NOTE — Progress Notes (Signed)
Cardiac Individual Treatment Plan  Patient Details  Name: Melanie Ray MRN: [id number] Date of Birth: 08-13-1951 Referring Provider:   Flowsheet Row Cardiac Rehab from 01/28/2023 in [facility] Cardiac and Pulmonary Rehab  Referring Provider Burt Knack       Initial Encounter Date:  Flowsheet Row Cardiac Rehab from 01/28/2023 in [facility] Cardiac and Pulmonary Rehab  Date 01/28/23       Visit Diagnosis: S/P mitral valve replacement  Heart failure, chronic systolic ([place])  Patient's Home Medications on Admission:  Current Outpatient Medications:    aspirin EC 81 MG tablet, Take 1 tablet (81 mg total) by mouth daily. Swallow whole., Disp: 30 tablet, Rfl: 12   atorvastatin (LIPITOR) 80 MG tablet, Take 1 tablet by mouth once daily, Disp: 90 tablet, Rfl: 0   Cariprazine HCl (VRAYLAR) 1.5 & 3 MG CPPK, Take by mouth as needed., Disp: , Rfl:    carvedilol (COREG) 12.5 MG tablet, Take 0.5 tablets (6.25 mg total) by mouth 2 (two) times daily with a meal., Disp: 60 tablet, Rfl: 1   dorzolamide-timolol (COSOPT) 22.3-6.8 MG/ML ophthalmic solution, Place 1 drop into both eyes 2 (two) times daily. , Disp: , Rfl:    empagliflozin ([name]) 10 MG TABS tablet, Take 1 tablet (10 mg total) by mouth daily., Disp: 30 tablet, Rfl: 1   furosemide (LASIX) 40 MG tablet, Take 40 mg by mouth as needed., Disp: , Rfl:    gabapentin (NEURONTIN) 100 MG capsule, Take 100 mg by mouth 2 (two) times daily., Disp: , Rfl:    irbesartan (AVAPRO) 75 MG tablet, Take 1/2 tablet (37.5 mg total) by mouth daily., Disp: 30 tablet, Rfl: 1   latanoprost (XALATAN) 0.005 % ophthalmic solution, Place 1 drop into both eyes at bedtime., Disp: , Rfl:    potassium chloride SA (KLOR-CON M) 20 MEQ tablet, Take one tablet when you take prn Lasix, Disp: 30 tablet, Rfl: 3   spironolactone (ALDACTONE) 25 MG tablet, Take 0.5 tablets (12.5 mg total) by mouth daily., Disp: 45 tablet, Rfl: 3   traZODone (DESYREL) 150 MG tablet, Take 150 mg by mouth at  bedtime., Disp: , Rfl:    warfarin (COUMADIN) 3 MG tablet, TAKE 1 TO 2 TABLETS BY MOUTH ONCE DAILY AS DIRECTED BY  COUMADIN  CLINIC, Disp: 60 tablet, Rfl: 5  Past Medical History: Past Medical History:  Diagnosis Date   AICD (automatic cardioverter/defibrillator) present    Anemia    Anxiety    Back pain    CAD (coronary artery disease) 04/11/2016   S/p ant STEMI 5/17: LHC >> LAD proximal 80%, mid 80%, distal 50%, ostial D1 60%; LCx with LPDA lesion 30%; RCA Mild calcification with no significant stenosis in a medium caliber, nondominant RCA; LVEF is estimated at 45% with inferoapical and lateral wall akinesis >> PCI: PCI: 3.5 x 24 mm Promus DES to prox LAD, 2.5 x 12 mm Promus DES to mid LAD. // Myoview 7/21: EF 20, no ischemia; high risk    Chest pain    Chronic systolic CHF (congestive heart failure) ([place]) 03/21/2016   Echo 01/30/17: Diff HK, mild focal basal septal hypertrophy, EF 30-35, mild AI, MAC, mild MR // Echo 06/08/16: Mild focal basal septal hypertrophy, EF 25-30%, diff HK, ant-septal AK, Gr 1 DD, mild AI, MAC, mild MR, PASP 37 mmHg // Echo 03/18/16: EF 30-35%, ant-septal AK, Gr 1 DD, mild MR, severe LAE.   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 Burt Knack and Dr. Ali Lowe   Sleep apnea    SOB (shortness of breath)    Stroke ([place])    2021   Suicidal ideation 08/27/2018   Swelling    feet or legs    Tobacco Use: Social History    Tobacco Use  Smoking Status Former   Packs/day: 0.50   Years: 25.00   Additional pack years: 0.00   Total pack years: 12.50   Types: Cigarettes   Quit date: 04/13/1997   Years since quitting: 25.8  Smokeless Tobacco Never    Labs: Review Flowsheet  More data exists      Latest Ref Rng & Units 08/02/2022 10/15/2022 10/24/2022 10/25/2022 10/26/2022  Labs for ITP Cardiac and Pulmonary Rehab  PH, Arterial 7.35 - 7.45 7.359  7.418  - 7.316  7.349  7.348  7.380  7.437  7.379  7.263  - -  PCO2 arterial 32 - 48 mmHg 43.5  29.4  - 40.6  38.3  35.2  35.7  32.0  34.0  42.2  - -  Bicarbonate 20.0 - 28.0 mmol/L 23.2  24.5  19.0  25.4  25.8  20.7  21.1  19.3  21.4  21.6  27.8  20.0  19.1  - -  TCO2 22 - 32 mmol/L 24  26  20  27  27  22  22  20  23  23  24   49  29  21  20  20  23   - -  Acid-base deficit 0.0 - 2.0 mmol/L 2.0  1.0  5.0  - 5.0  4.0  6.0  4.0  2.0  5.0  7.0  - -  O2 Saturation % 53  53  98  64  64  98  79  98  98  93  100  81  100  100  70.1  78.2      Exercise Target Goals: Exercise Program Goal: Individual exercise prescription set using results from initial 6 min walk test and THRR while considering  patient's activity barriers and safety.   Exercise Prescription Goal: Initial exercise prescription builds to 30-45 minutes a day of aerobic activity, 2-3 days per week.  Home exercise guidelines will be given to patient during program as part of exercise prescription that the participant will acknowledge.   Education: Aerobic Exercise: - Group verbal and visual presentation on the components of exercise prescription. Introduces F.I.T.T principle from ACSM for exercise prescriptions.  Reviews F.I.T.T. principles of aerobic exercise including progression. Written material given at graduation.   Education: Resistance Exercise: - Group verbal and visual presentation on the components of exercise prescription. Introduces F.I.T.T principle from ACSM for exercise prescriptions   Reviews F.I.T.T. principles of resistance exercise including progression. Written material given at graduation.    Education: Exercise & Equipment Safety: -  Individual verbal instruction and demonstration of equipment use and safety with use of the equipment. Flowsheet Row Cardiac Rehab from 01/28/2023 in [facility] Cardiac and Pulmonary Rehab  Date 01/28/23  Educator [facility]  Instruction Review Code 1- Verbalizes Understanding       Education: Exercise Physiology & General Exercise Guidelines: - Group verbal and written instruction with models to review the exercise physiology of the cardiovascular system and associated critical values. Provides general exercise guidelines with specific guidelines to those with heart or lung disease.    Education: Flexibility, Balance, Mind/Body Relaxation: - Group verbal and visual presentation with interactive activity on the components of exercise prescription. Introduces F.I.T.T principle from ACSM for exercise prescriptions. Reviews F.I.T.T. principles of flexibility and balance exercise training including progression. Also discusses the mind body connection.  Reviews various relaxation techniques to help reduce and manage stress (i.e. Deep breathing, progressive muscle relaxation, and visualization). Balance handout provided to take home. Written material given at graduation.   Activity Barriers & Risk Stratification:  Activity Barriers & Cardiac Risk Stratification - 01/28/23 1704       Activity Barriers & Cardiac Risk Stratification   Activity Barriers None    Cardiac Risk Stratification High             6 Minute Walk:  6 Minute Walk     Row Name 01/28/23 1649         6 Minute Walk   Phase Initial     Distance 820 feet     Walk Time 6 minutes     # of Rest Breaks 0     MPH 1.55     METS 1.87     RPE 11     Perceived Dyspnea  1     VO2 Peak 6.54     Symptoms No     Resting HR 85 bpm     Resting BP 124/72     Resting Oxygen  Saturation  100 %     Exercise Oxygen Saturation  during 6 min walk 100 %     Max Ex. HR 91 bpm     Max Ex. BP 112/60     2 Minute Post BP 104/60              Oxygen Initial Assessment:   Oxygen Re-Evaluation:   Oxygen Discharge (Final Oxygen Re-Evaluation):   Initial Exercise Prescription:  Initial Exercise Prescription - 01/28/23 1700       Date of Initial Exercise RX and Referring Provider   Date 01/28/23    Referring Provider Burt Knack      Oxygen   Maintain Oxygen Saturation 88% or higher      Treadmill   MPH 1.2    Grade 0    Minutes 15    METs 1.92      Recumbant Bike   Level 1    RPM 50    Watts 15    Minutes 15      NuStep   Level 1    SPM 80    Minutes 15    METs 1.87      T5 Nustep   Level 1    SPM 80    Minutes 15    METs 1.87      Biostep-RELP   Level 1    SPM 50    Minutes 15    METs 1.87      Prescription Details   Frequency (times per week) 2    Duration  Progress to 30 minutes of continuous aerobic without signs/symptoms of physical distress      Intensity   THRR 40-80% of Max Heartrate 110-135    Ratings of Perceived Exertion 11-13    Perceived Dyspnea 0-4      Progression   Progression Continue to progress workloads to maintain intensity without signs/symptoms of physical distress.      Resistance Training   Training Prescription Yes    Weight 2    Reps 10-15             Perform Capillary Blood Glucose checks as needed.  Exercise Prescription Changes:   Exercise Prescription Changes     Row Name 01/28/23 1700             Response to Exercise   Blood Pressure (Admit) 124/72       Blood Pressure (Exercise) 112/60       Blood Pressure (Exit) 104/60       Heart Rate (Admit) 85 bpm       Heart Rate (Exercise) 91 bpm       Heart Rate (Exit) 86 bpm       Oxygen Saturation (Admit) 100 %       Oxygen Saturation (Exercise) 100 %       Oxygen Saturation (Exit) 100 %       Rating of Perceived Exertion  (Exercise) 11       Perceived Dyspnea (Exercise) 1       Symptoms none       Comments 6 MWT results                Exercise Comments:   Exercise Goals and Review:   Exercise Goals     Row Name 01/28/23 1715             Exercise Goals   Increase Physical Activity Yes       Intervention Provide advice, education, support and counseling about physical activity/exercise needs.;Develop an individualized exercise prescription for aerobic and resistive training based on initial evaluation findings, risk stratification, comorbidities and participant's personal goals.       Expected Outcomes Short Term: Attend rehab on a regular basis to increase amount of physical activity.;Long Term: Add in home exercise to make exercise part of routine and to increase amount of physical activity.;Long Term: Exercising regularly at least 3-5 days a week.       Increase Strength and Stamina Yes       Intervention Provide advice, education, support and counseling about physical activity/exercise needs.;Develop an individualized exercise prescription for aerobic and resistive training based on initial evaluation findings, risk stratification, comorbidities and participant's personal goals.       Expected Outcomes Short Term: Increase workloads from initial exercise prescription for resistance, speed, and METs.;Short Term: Perform resistance training exercises routinely during rehab and add in resistance training at home;Long Term: Improve cardiorespiratory fitness, muscular endurance and strength as measured by increased METs and functional capacity (6MWT)       Able to understand and use rate of perceived exertion (RPE) scale Yes       Intervention Provide education and explanation on how to use RPE scale       Expected Outcomes Short Term: Able to use RPE daily in rehab to express subjective intensity level;Long Term:  Able to use RPE to guide intensity level when exercising independently       Able to  understand and use Dyspnea scale Yes  Intervention Provide education and explanation on how to use Dyspnea scale       Expected Outcomes Short Term: Able to use Dyspnea scale daily in rehab to express subjective sense of shortness of breath during exertion;Long Term: Able to use Dyspnea scale to guide intensity level when exercising independently       Knowledge and understanding of Target Heart Rate Range (THRR) Yes       Intervention Provide education and explanation of THRR including how the numbers were predicted and where they are located for reference       Expected Outcomes Short Term: Able to state/look up THRR;Long Term: Able to use THRR to govern intensity when exercising independently;Short Term: Able to use daily as guideline for intensity in rehab       Able to check pulse independently Yes       Intervention Provide education and demonstration on how to check pulse in carotid and radial arteries.;Review the importance of being able to check your own pulse for safety during independent exercise       Expected Outcomes Short Term: Able to explain why pulse checking is important during independent exercise;Long Term: Able to check pulse independently and accurately       Understanding of Exercise Prescription Yes       Intervention Provide education, explanation, and written materials on patient's individual exercise prescription       Expected Outcomes Short Term: Able to explain program exercise prescription;Long Term: Able to explain home exercise prescription to exercise independently                Exercise Goals Re-Evaluation :   Discharge Exercise Prescription (Final Exercise Prescription Changes):  Exercise Prescription Changes - 01/28/23 1700       Response to Exercise   Blood Pressure (Admit) 124/72    Blood Pressure (Exercise) 112/60    Blood Pressure (Exit) 104/60    Heart Rate (Admit) 85 bpm    Heart Rate (Exercise) 91 bpm    Heart Rate (Exit) 86 bpm     Oxygen Saturation (Admit) 100 %    Oxygen Saturation (Exercise) 100 %    Oxygen Saturation (Exit) 100 %    Rating of Perceived Exertion (Exercise) 11    Perceived Dyspnea (Exercise) 1    Symptoms none    Comments 6 MWT results             Nutrition:  Target Goals: Understanding of nutrition guidelines, daily intake of sodium 1500mg , cholesterol 200mg , calories 30% from fat and 7% or less from saturated fats, daily to have 5 or more servings of fruits and vegetables.  Education: All About Nutrition: -Group instruction provided by verbal, written material, interactive activities, discussions, models, and posters to present general guidelines for heart healthy nutrition including fat, fiber, MyPlate, the role of sodium in heart healthy nutrition, utilization of the nutrition label, and utilization of this knowledge for meal planning. Follow up email sent as well. Written material given at graduation.   Biometrics:  Pre Biometrics - 01/28/23 1716       Pre Biometrics   Height 5\' 3"  (1.6 m)    Weight 132 lb 1.6 oz (59.9 kg)    Waist Circumference 33 inches    Hip Circumference 36.5 inches    Waist to Hip Ratio 0.9 %    BMI (Calculated) 23.41    Single Leg Stand 7.09 seconds              Nutrition  Therapy Plan and Nutrition Goals:  Nutrition Therapy & Goals - 01/28/23 1554       Nutrition Therapy   Diet Heart healthy, low Na, T2DM MNT    Drug/Food Interactions Statins/Certain Fruits;Coumadin/Vit K    Protein (specify units) 75g    Fiber 25 grams    Whole Grain Foods 3 servings    Saturated Fats 12 max. grams    Fruits and Vegetables 8 servings/day    Sodium 2 grams      Personal Nutrition Goals   Nutrition Goal ST: compare 3-4 days of eating with list of foods high in vitamin K to see how much you are eating, read/compare food labels, review paperwork LT: manage INR, maintain A1C <7, limit sodium <2g/day, follow MyPlate guidelines    Comments 72 y.o. F admitted to  cardiac rehab for chronic systolic CHF. PMHx includes CAD, HLD, anemia, stroke, severe mitral insufficiency, severe mitral regurgitation, T2DM, osteopenia, anxiety/depression. Medications reviewed atorvastatin, jardiance, furosemide, potassium-chloride, trazodone, warfarin. B: [place] sausage and eggs D: chicken or fish or beef with a vegetables. She reports using butter, she will use seasoning salt with her meat. She may also have cold cuts. S:she reports having a sweet tooth - she will have a cookie or 1/2 cup of ice cream and she may have reeses PB cup. Drinks: water and diet coke (not even a whole bottle per day and sweet tea (1/4 cup usually). Discussed heart healthy eating and diabetes friendly eating. Georgene would like to work on limiting sodium - reviewed label reading; she feels confident in label reading. Discussed warfarin and vitamin K consistency.      Intervention Plan   Intervention Prescribe, educate and counsel regarding individualized specific dietary modifications aiming towards targeted core components such as weight, hypertension, lipid management, diabetes, heart failure and other comorbidities.;Nutrition handout(s) given to patient.    Expected Outcomes Short Term Goal: Understand basic principles of dietary content, such as calories, fat, sodium, cholesterol and nutrients.;Short Term Goal: A plan has been developed with personal nutrition goals set during dietitian appointment.;Long Term Goal: Adherence to prescribed nutrition plan.             Nutrition Assessments:  MEDIFICTS Score Key: ?70 Need to make dietary changes  40-70 Heart Healthy Diet ? 40 Therapeutic Level Cholesterol Diet   Picture Your Plate Scores: [id number] Unhealthy dietary pattern with much room for improvement. 41-50 Dietary pattern unlikely to meet recommendations for good health and room for improvement. 51-60 More healthful dietary pattern, with some room for improvement.  >60 Healthy dietary pattern,  although there may be some specific behaviors that could be improved.    Nutrition Goals Re-Evaluation:   Nutrition Goals Discharge (Final Nutrition Goals Re-Evaluation):   Psychosocial: Target Goals: Acknowledge presence or absence of significant depression and/or stress, maximize coping skills, provide positive support system. Participant is able to verbalize types and ability to use techniques and skills needed for reducing stress and depression.   Education: Stress, Anxiety, and Depression - Group verbal and visual presentation to define topics covered.  Reviews how body is impacted by stress, anxiety, and depression.  Also discusses healthy ways to reduce stress and to treat/manage anxiety and depression.  Written material given at graduation.   Education: Sleep Hygiene -Provides group verbal and written instruction about how sleep can affect your health.  Define sleep hygiene, discuss sleep cycles and impact of sleep habits. Review good sleep hygiene tips.    Initial Review & Psychosocial Screening:  Initial  Psych Review & Screening - 12/26/22 1345       Initial Review   Current issues with History of Depression;Current Depression;Current Anxiety/Panic      Family Dynamics   Good Support System? Yes    Comments Her depression and anxiety does not stem from any one thing. Idman can look to her sister, brother and husband. She is ready to get her health on track and start rehab.      Barriers   Psychosocial barriers to participate in program The patient should benefit from training in stress management and relaxation.      Screening Interventions   Interventions Encouraged to exercise;Provide feedback about the scores to participant;To provide support and resources with identified psychosocial needs    Expected Outcomes Short Term goal: Utilizing psychosocial counselor, staff and physician to assist with identification of specific Stressors or current issues interfering with  healing process. Setting desired goal for each stressor or current issue identified.;Long Term Goal: Stressors or current issues are controlled or eliminated.;Short Term goal: Identification and review with participant of any Quality of Life or Depression concerns found by scoring the questionnaire.;Long Term goal: The participant improves quality of Life and PHQ9 Scores as seen by post scores and/or verbalization of changes             Quality of Life Scores:   Scores of 19 and below usually indicate a poorer quality of life in these areas.  A difference of  2-3 points is a clinically meaningful difference.  A difference of 2-3 points in the total score of the Quality of Life Index has been associated with significant improvement in overall quality of life, self-image, physical symptoms, and general health in studies assessing change in quality of life.  PHQ-9: Review Flowsheet  More data exists      01/28/2023 01/02/2023 12/28/2022 09/25/2022 05/07/2022  Depression screen PHQ 2/9  Decreased Interest 1 3 3 3  0  Down, Depressed, Hopeless 1 3 3 3  0  PHQ - 2 Score 2 6 6 6  0  Altered sleeping 1 3 3  - -  Tired, decreased energy 1 3 3  - -  Change in appetite 0 3 0 - -  Feeling bad or failure about yourself  0 2 3 - -  Trouble concentrating 0 2 0 - -  Moving slowly or fidgety/restless 0 2 1 - -  Suicidal thoughts 0 3 2 - -  PHQ-9 Score 4 24 18  - -  Difficult doing work/chores Not difficult at all Somewhat difficult Somewhat difficult - -   Interpretation of Total Score  Total Score Depression Severity:  1-4 = Minimal depression, 5-9 = Mild depression, 10-14 = Moderate depression, 15-19 = Moderately severe depression, 20-27 = Severe depression   Psychosocial Evaluation and Intervention:  Psychosocial Evaluation - 12/26/22 1349       Psychosocial Evaluation & Interventions   Interventions Encouraged to exercise with the program and follow exercise prescription;Relaxation education;Stress  management education    Comments Her depression and anxiety does not stem from any one thing. [place] can look to her sister, brother and husband. She is ready to get her health on track and start rehab.    Expected Outcomes Short: Start [facility] to help with mood. Long: Maintain a healthy mental state    Continue Psychosocial Services  Follow up required by staff             Psychosocial Re-Evaluation:   Psychosocial Discharge (Final Psychosocial Re-Evaluation):   Vocational  Rehabilitation: Provide vocational rehab assistance to qualifying candidates.   Vocational Rehab Evaluation & Intervention:   Education: Education Goals: Education classes will be provided on a variety of topics geared toward better understanding of heart health and risk factor modification. Participant will state understanding/return demonstration of topics presented as noted by education test scores.  Learning Barriers/Preferences:  Learning Barriers/Preferences - 12/26/22 1342       Learning Barriers/Preferences   Learning Barriers None    Learning Preferences None             General Cardiac Education Topics:  AED/CPR: - Group verbal and written instruction with the use of models to demonstrate the basic use of the AED with the basic ABC's of resuscitation.   Anatomy and Cardiac Procedures: - Group verbal and visual presentation and models provide information about basic cardiac anatomy and function. Reviews the testing methods done to diagnose heart disease and the outcomes of the test results. Describes the treatment choices: Medical Management, Angioplasty, or Coronary Bypass Surgery for treating various heart conditions including Myocardial Infarction, Angina, Valve Disease, and Cardiac Arrhythmias.  Written material given at graduation.   Medication Safety: - Group verbal and visual instruction to review commonly prescribed medications for heart and lung disease. Reviews the  medication, class of the drug, and side effects. Includes the steps to properly store meds and maintain the prescription regimen.  Written material given at graduation.   Intimacy: - Group verbal instruction through game format to discuss how heart and lung disease can affect sexual intimacy. Written material given at graduation..   Know Your Numbers and Heart Failure: - Group verbal and visual instruction to discuss disease risk factors for cardiac and pulmonary disease and treatment options.  Reviews associated critical values for Overweight/Obesity, Hypertension, Cholesterol, and Diabetes.  Discusses basics of heart failure: signs/symptoms and treatments.  Introduces Heart Failure Zone chart for action plan for heart failure.  Written material given at graduation.   Infection Prevention: - Provides verbal and written material to individual with discussion of infection control including proper hand washing and proper equipment cleaning during exercise session. Flowsheet Row Cardiac Rehab from 01/28/2023 in [facility] Cardiac and Pulmonary Rehab  Date 01/28/23  Educator [facility]  Instruction Review Code 1- Verbalizes Understanding       Falls Prevention: - Provides verbal and written material to individual with discussion of falls prevention and safety. Flowsheet Row Cardiac Rehab from 01/28/2023 in [facility] Cardiac and Pulmonary Rehab  Date 01/28/23  Educator [facility]  Instruction Review Code 1- Verbalizes Understanding       Other: -Provides group and verbal instruction on various topics (see comments)   Knowledge Questionnaire Score:   Core Components/Risk Factors/Patient Goals at Admission:  Personal Goals and Risk Factors at Admission - 01/28/23 1716       Core Components/Risk Factors/Patient Goals on Admission    Weight Management Yes;Weight Maintenance    Intervention Weight Management: Develop a combined nutrition and exercise program designed to reach desired caloric intake, while  maintaining appropriate intake of nutrient and fiber, sodium and fats, and appropriate energy expenditure required for the weight goal.;Weight Management: Provide education and appropriate resources to help participant work on and attain dietary goals.;Weight Management/Obesity: Establish reasonable short term and long term weight goals.    Admit Weight 132 lb 1.6 oz (59.9 kg)    Goal Weight: Short Term 132 lb (59.9 kg)    Goal Weight: Long Term 132 lb (59.9 kg)    Expected Outcomes Short  Term: Continue to assess and modify interventions until short term weight is achieved;Long Term: Adherence to nutrition and physical activity/exercise program aimed toward attainment of established weight goal;Weight Maintenance: Understanding of the daily nutrition guidelines, which includes 25-35% calories from fat, 7% or less cal from saturated fats, less than 200mg  cholesterol, less than 1.5gm of sodium, & 5 or more servings of fruits and vegetables daily;Understanding recommendations for meals to include 15-35% energy as protein, 25-35% energy from fat, 35-60% energy from carbohydrates, less than 200mg  of dietary cholesterol, 20-35 gm of total fiber daily;Understanding of distribution of calorie intake throughout the day with the consumption of 4-5 meals/snacks    Diabetes Yes    Intervention Provide education about signs/symptoms and action to take for hypo/hyperglycemia.;Provide education about proper nutrition, including hydration, and aerobic/resistive exercise prescription along with prescribed medications to achieve blood glucose in normal ranges: Fasting glucose 65-99 mg/dL    Expected Outcomes Short Term: Participant verbalizes understanding of the signs/symptoms and immediate care of hyper/hypoglycemia, proper foot care and importance of medication, aerobic/resistive exercise and nutrition plan for blood glucose control.;Long Term: Attainment of HbA1C < 7%.    Heart Failure Yes    Intervention Provide a  combined exercise and nutrition program that is supplemented with education, support and counseling about heart failure. Directed toward relieving symptoms such as shortness of breath, decreased exercise tolerance, and extremity edema.    Expected Outcomes Improve functional capacity of life;Short term: Attendance in program 2-3 days a week with increased exercise capacity. Reported lower sodium intake. Reported increased fruit and vegetable intake. Reports medication compliance.;Long term: Adoption of self-care skills and reduction of barriers for early signs and symptoms recognition and intervention leading to self-care maintenance.;Short term: Daily weights obtained and reported for increase. Utilizing diuretic protocols set by physician.    Hypertension Yes    Intervention Provide education on lifestyle modifcations including regular physical activity/exercise, weight management, moderate sodium restriction and increased consumption of fresh fruit, vegetables, and low fat dairy, alcohol moderation, and smoking cessation.;Monitor prescription use compliance.    Expected Outcomes Short Term: Continued assessment and intervention until BP is < 140/79mm HG in hypertensive participants. < 130/27mm HG in hypertensive participants with diabetes, heart failure or chronic kidney disease.;Long Term: Maintenance of blood pressure at goal levels.    Lipids Yes    Intervention Provide education and support for participant on nutrition & aerobic/resistive exercise along with prescribed medications to achieve LDL 70mg , HDL >40mg .    Expected Outcomes Short Term: Participant states understanding of desired cholesterol values and is compliant with medications prescribed. Participant is following exercise prescription and nutrition guidelines.;Long Term: Cholesterol controlled with medications as prescribed, with individualized exercise RX and with personalized nutrition plan. Value goals: LDL < 70mg , HDL > 40 mg.              Education:Diabetes - Individual verbal and written instruction to review signs/symptoms of diabetes, desired ranges of glucose level fasting, after meals and with exercise. Acknowledge that pre and post exercise glucose checks will be done for 3 sessions at entry of program. [place] from 01/28/2023 in [facility] Cardiac and Pulmonary Rehab  Date 01/28/23  Educator [facility]  Instruction Review Code 1- Verbalizes Understanding       Core Components/Risk Factors/Patient Goals Review:    Core Components/Risk Factors/Patient Goals at Discharge (Final Review):    ITP Comments:  ITP Comments     [place] Name 12/26/22 1341 01/28/23 1647  ITP Comments Virtual Visit completed. Patient informed on EP and RD appointment and 6 Minute walk test. Patient also informed of patient health questionnaires on My Chart. Patient Verbalizes understanding. Visit diagnosis can be found in [employer] 10/24/2022. Completed 6MWT and gym orientation. Initial ITP created and sent for review to Dr. Ramonita Lab, Medical Director.               Comments: initial ITP

## 2023-01-28 NOTE — Telephone Encounter (Signed)
Refill request for warfarin:  Last INR was 1.6 on 01/23/23 Next INR due on 02/06/23 LOV was 01/17/23  T Conte PA-C  Refill approved.

## 2023-01-28 NOTE — Patient Instructions (Signed)
Patient Instructions  Patient Details  Name: Melanie Ray MRN: [id number] Date of Birth: May 31, 1951 Referring Provider:  Sherren Mocha, MD  Below are your personal goals for exercise, nutrition, and risk factors. Our goal is to help you stay on track towards obtaining and maintaining these goals. We will be discussing your progress on these goals with you throughout the program.  Initial Exercise Prescription:  Initial Exercise Prescription - 01/28/23 1700       Date of Initial Exercise RX and Referring Provider   Date 01/28/23    Referring Provider Burt Knack      Oxygen   Maintain Oxygen Saturation 88% or higher      Treadmill   MPH 1.2    Grade 0    Minutes 15    METs 1.92      Recumbant Bike   Level 1    RPM 50    Watts 15    Minutes 15      NuStep   Level 1    SPM 80    Minutes 15    METs 1.87      T5 Nustep   Level 1    SPM 80    Minutes 15    METs 1.87      Biostep-RELP   Level 1    SPM 50    Minutes 15    METs 1.87      Prescription Details   Frequency (times per week) 2    Duration Progress to 30 minutes of continuous aerobic without signs/symptoms of physical distress      Intensity   THRR 40-80% of Max Heartrate 110-135    Ratings of Perceived Exertion 11-13    Perceived Dyspnea 0-4      Progression   Progression Continue to progress workloads to maintain intensity without signs/symptoms of physical distress.      Resistance Training   Training Prescription Yes    Weight 2    Reps 10-15             Exercise Goals: Frequency: Be able to perform aerobic exercise two to three times per week in program working toward 2-5 days per week of home exercise.  Intensity: Work with a perceived exertion of 11 (fairly light) - 15 (hard) while following your exercise prescription.  We will make changes to your prescription with you as you progress through the program.   Duration: Be able to do 30 to 45 minutes of continuous aerobic  exercise in addition to a 5 minute warm-up and a 5 minute cool-down routine.   Nutrition Goals: Your personal nutrition goals will be established when you do your nutrition analysis with the dietician.  The following are general nutrition guidelines to follow: Cholesterol < 200mg /day Sodium < 1500mg /day Fiber: Women over 50 yrs - 21 grams per day  Personal Goals:  Personal Goals and Risk Factors at Admission - 01/28/23 1716       Core Components/Risk Factors/Patient Goals on Admission    Weight Management Yes;Weight Maintenance    Intervention Weight Management: Develop a combined nutrition and exercise program designed to reach desired caloric intake, while maintaining appropriate intake of nutrient and fiber, sodium and fats, and appropriate energy expenditure required for the weight goal.;Weight Management: Provide education and appropriate resources to help participant work on and attain dietary goals.;Weight Management/Obesity: Establish reasonable short term and long term weight goals.    Admit Weight 132 lb 1.6 oz (59.9 kg)    Goal Weight:  Short Term 132 lb (59.9 kg)    Goal Weight: Long Term 132 lb (59.9 kg)    Expected Outcomes Short Term: Continue to assess and modify interventions until short term weight is achieved;Long Term: Adherence to nutrition and physical activity/exercise program aimed toward attainment of established weight goal;Weight Maintenance: Understanding of the daily nutrition guidelines, which includes 25-35% calories from fat, 7% or less cal from saturated fats, less than 200mg  cholesterol, less than 1.5gm of sodium, & 5 or more servings of fruits and vegetables daily;Understanding recommendations for meals to include 15-35% energy as protein, 25-35% energy from fat, 35-60% energy from carbohydrates, less than 200mg  of dietary cholesterol, 20-35 gm of total fiber daily;Understanding of distribution of calorie intake throughout the day with the consumption of 4-5  meals/snacks    Diabetes Yes    Intervention Provide education about signs/symptoms and action to take for hypo/hyperglycemia.;Provide education about proper nutrition, including hydration, and aerobic/resistive exercise prescription along with prescribed medications to achieve blood glucose in normal ranges: Fasting glucose 65-99 mg/dL    Expected Outcomes Short Term: Participant verbalizes understanding of the signs/symptoms and immediate care of hyper/hypoglycemia, proper foot care and importance of medication, aerobic/resistive exercise and nutrition plan for blood glucose control.;Long Term: Attainment of HbA1C < 7%.    Heart Failure Yes    Intervention Provide a combined exercise and nutrition program that is supplemented with education, support and counseling about heart failure. Directed toward relieving symptoms such as shortness of breath, decreased exercise tolerance, and extremity edema.    Expected Outcomes Improve functional capacity of life;Short term: Attendance in program 2-3 days a week with increased exercise capacity. Reported lower sodium intake. Reported increased fruit and vegetable intake. Reports medication compliance.;Long term: Adoption of self-care skills and reduction of barriers for early signs and symptoms recognition and intervention leading to self-care maintenance.;Short term: Daily weights obtained and reported for increase. Utilizing diuretic protocols set by physician.    Hypertension Yes    Intervention Provide education on lifestyle modifcations including regular physical activity/exercise, weight management, moderate sodium restriction and increased consumption of fresh fruit, vegetables, and low fat dairy, alcohol moderation, and smoking cessation.;Monitor prescription use compliance.    Expected Outcomes Short Term: Continued assessment and intervention until BP is < 140/28mm HG in hypertensive participants. < 130/53mm HG in hypertensive participants with diabetes,  heart failure or chronic kidney disease.;Long Term: Maintenance of blood pressure at goal levels.    Lipids Yes    Intervention Provide education and support for participant on nutrition & aerobic/resistive exercise along with prescribed medications to achieve LDL 70mg , HDL >40mg .    Expected Outcomes Short Term: Participant states understanding of desired cholesterol values and is compliant with medications prescribed. Participant is following exercise prescription and nutrition guidelines.;Long Term: Cholesterol controlled with medications as prescribed, with individualized exercise RX and with personalized nutrition plan. Value goals: LDL < 70mg , HDL > 40 mg.             Tobacco Use Initial Evaluation: Social History   Tobacco Use  Smoking Status Former   Packs/day: 0.50   Years: 25.00   Additional pack years: 0.00   Total pack years: 12.50   Types: Cigarettes   Quit date: 04/13/1997   Years since quitting: 25.8  Smokeless Tobacco Never    Exercise Goals and Review:  Exercise Goals     Row Name 01/28/23 1715             Exercise Goals   Increase Physical  Activity Yes       Intervention Provide advice, education, support and counseling about physical activity/exercise needs.;Develop an individualized exercise prescription for aerobic and resistive training based on initial evaluation findings, risk stratification, comorbidities and participant's personal goals.       Expected Outcomes Short Term: Attend rehab on a regular basis to increase amount of physical activity.;Long Term: Add in home exercise to make exercise part of routine and to increase amount of physical activity.;Long Term: Exercising regularly at least 3-5 days a week.       Increase Strength and Stamina Yes       Intervention Provide advice, education, support and counseling about physical activity/exercise needs.;Develop an individualized exercise prescription for aerobic and resistive training based on  initial evaluation findings, risk stratification, comorbidities and participant's personal goals.       Expected Outcomes Short Term: Increase workloads from initial exercise prescription for resistance, speed, and METs.;Short Term: Perform resistance training exercises routinely during rehab and add in resistance training at home;Long Term: Improve cardiorespiratory fitness, muscular endurance and strength as measured by increased METs and functional capacity (6MWT)       Able to understand and use rate of perceived exertion (RPE) scale Yes       Intervention Provide education and explanation on how to use RPE scale       Expected Outcomes Short Term: Able to use RPE daily in rehab to express subjective intensity level;Long Term:  Able to use RPE to guide intensity level when exercising independently       Able to understand and use Dyspnea scale Yes       Intervention Provide education and explanation on how to use Dyspnea scale       Expected Outcomes Short Term: Able to use Dyspnea scale daily in rehab to express subjective sense of shortness of breath during exertion;Long Term: Able to use Dyspnea scale to guide intensity level when exercising independently       Knowledge and understanding of Target Heart Rate Range (THRR) Yes       Intervention Provide education and explanation of THRR including how the numbers were predicted and where they are located for reference       Expected Outcomes Short Term: Able to state/look up THRR;Long Term: Able to use THRR to govern intensity when exercising independently;Short Term: Able to use daily as guideline for intensity in rehab       Able to check pulse independently Yes       Intervention Provide education and demonstration on how to check pulse in carotid and radial arteries.;Review the importance of being able to check your own pulse for safety during independent exercise       Expected Outcomes Short Term: Able to explain why pulse checking is  important during independent exercise;Long Term: Able to check pulse independently and accurately       Understanding of Exercise Prescription Yes       Intervention Provide education, explanation, and written materials on patient's individual exercise prescription       Expected Outcomes Short Term: Able to explain program exercise prescription;Long Term: Able to explain home exercise prescription to exercise independently                Copy of goals given to participant.

## 2023-01-29 ENCOUNTER — Other Ambulatory Visit: Payer: Self-pay | Admitting: Family Medicine

## 2023-01-29 DIAGNOSIS — I639 Cerebral infarction, unspecified: Secondary | ICD-10-CM

## 2023-01-30 ENCOUNTER — Other Ambulatory Visit: Payer: Self-pay | Admitting: Family Medicine

## 2023-01-30 DIAGNOSIS — I639 Cerebral infarction, unspecified: Secondary | ICD-10-CM

## 2023-01-30 NOTE — Telephone Encounter (Signed)
Looks like medication was stopped at discharge. Is it okay to refuse?

## 2023-02-01 NOTE — Progress Notes (Signed)
Remote ICD transmission.   

## 2023-02-04 NOTE — Telephone Encounter (Signed)
Based on the review of the discharge summary from December she should no longer be on plavix.

## 2023-02-04 NOTE — Telephone Encounter (Signed)
Note say Plavix discontinued at last DC from hospital in 10/2022 please advise should Plavix be refilled.

## 2023-02-05 ENCOUNTER — Encounter: Payer: Medicare Other | Attending: Cardiovascular Disease | Admitting: *Deleted

## 2023-02-05 ENCOUNTER — Other Ambulatory Visit: Payer: Self-pay | Admitting: Family Medicine

## 2023-02-05 DIAGNOSIS — Z952 Presence of prosthetic heart valve: Secondary | ICD-10-CM | POA: Insufficient documentation

## 2023-02-05 DIAGNOSIS — I5022 Chronic systolic (congestive) heart failure: Secondary | ICD-10-CM | POA: Insufficient documentation

## 2023-02-05 DIAGNOSIS — I639 Cerebral infarction, unspecified: Secondary | ICD-10-CM

## 2023-02-05 NOTE — Progress Notes (Signed)
Daily Session Note  Patient Details  Name: Melanie Ray MRN: [id number] Date of Birth: Aug 05, 1951 Referring Provider:   Flowsheet Row Cardiac Rehab from 01/28/2023 in [facility] Cardiac and Pulmonary Rehab  Referring Provider Burt Knack       Encounter Date: 02/05/2023  Check In:  Session Check In - 02/05/23 1004       Check-In   Supervising physician immediately available to respond to emergencies See telemetry face sheet for immediately available ER MD    Location [facility]-Cardiac & Pulmonary Rehab    Staff Present Renita Papa, RN Abel Presto, MS, ACSM CEP, Exercise Physiologist;Melissa Tilford Pillar MS, RDN, LDN;Other   Gretchen Short BA, Exercise Science   Virtual Visit No    Medication changes reported     No    Fall or balance concerns reported    No    Warm-up and Cool-down Performed on first and last piece of equipment    Resistance Training Performed Yes    VAD Patient? No    PAD/SET Patient? No      Pain Assessment   Currently in Pain? No/denies                Social History   Tobacco Use  Smoking Status Former   Packs/day: 0.50   Years: 25.00   Additional pack years: 0.00   Total pack years: 12.50   Types: Cigarettes   Quit date: 04/13/1997   Years since quitting: 25.8  Smokeless Tobacco Never    Goals Met:  Independence with exercise equipment Exercise tolerated well No report of concerns or symptoms today Strength training completed today  Goals Unmet:  Not Applicable  Comments: First full day of exercise!  Patient was oriented to gym and equipment including functions, settings, policies, and procedures.  Patient's individual exercise prescription and treatment plan were reviewed.  All starting workloads were established based on the results of the 6 minute walk test done at initial orientation visit.  The plan for exercise progression was also introduced and progression will be customized based on patient's performance and goals.    Dr.  Emily Filbert is Medical Director for [facility].  Dr. Ottie Glazier is Medical Director for [facility] Pulmonary Rehabilitation.

## 2023-02-06 ENCOUNTER — Ambulatory Visit: Payer: Medicare Other | Attending: Internal Medicine

## 2023-02-06 DIAGNOSIS — Z952 Presence of prosthetic heart valve: Secondary | ICD-10-CM | POA: Insufficient documentation

## 2023-02-06 DIAGNOSIS — I639 Cerebral infarction, unspecified: Secondary | ICD-10-CM | POA: Diagnosis not present

## 2023-02-06 LAB — POCT INR: INR: 1.3 — AB (ref 2.0–3.0)

## 2023-02-06 NOTE — Patient Instructions (Signed)
Description   Take 2.5 tablets (7.5 mg) today and tomorrow, then start taking 2 tablets daily except 1.5 tablets on Fridays. Recheck INR in 1 week  Be consistent with 2 serving of greens per week.  Coumadin Clinic [phone]

## 2023-02-07 ENCOUNTER — Telehealth: Payer: Self-pay

## 2023-02-07 ENCOUNTER — Encounter: Payer: Medicare Other | Admitting: *Deleted

## 2023-02-07 DIAGNOSIS — Z952 Presence of prosthetic heart valve: Secondary | ICD-10-CM

## 2023-02-07 DIAGNOSIS — I5022 Chronic systolic (congestive) heart failure: Secondary | ICD-10-CM | POA: Diagnosis not present

## 2023-02-07 NOTE — Progress Notes (Signed)
Daily Session Note  Patient Details  Name: Melanie Ray MRN: [id number] Date of Birth: Oct 14, 1951 Referring Provider:   Flowsheet Row Cardiac Rehab from 01/28/2023 in [facility] Cardiac and Pulmonary Rehab  Referring Provider Burt Knack       Encounter Date: 02/07/2023  Check In:  Session Check In - 02/07/23 1005       Check-In   Supervising physician immediately available to respond to emergencies See telemetry face sheet for immediately available ER MD    Location [facility]-Cardiac & Pulmonary Rehab    Staff Present Darlyne Russian, RN, Lorin Mercy, MS, ACSM CEP, Exercise Physiologist;Joseph Tessie Fass, [place]    Virtual Visit No    Medication changes reported     No    Fall or balance concerns reported    No    Warm-up and Cool-down Performed on first and last piece of equipment    Resistance Training Performed Yes    VAD Patient? No    PAD/SET Patient? No      Pain Assessment   Currently in Pain? No/denies                Social History   Tobacco Use  Smoking Status Former   Packs/day: 0.50   Years: 25.00   Additional pack years: 0.00   Total pack years: 12.50   Types: Cigarettes   Quit date: 04/13/1997   Years since quitting: 25.8  Smokeless Tobacco Never    Goals Met:  Independence with exercise equipment Exercise tolerated well No report of concerns or symptoms today Strength training completed today  Goals Unmet:  Not Applicable  Comments: Pt able to follow exercise prescription today without complaint.  Will continue to monitor for progression.    Dr. Emily Filbert is Medical Director for [facility].  Dr. Ottie Glazier is Medical Director for [facility] Pulmonary Rehabilitation.

## 2023-02-07 NOTE — Telephone Encounter (Signed)
Patient is scheduled for 02/13/23 at 10:00

## 2023-02-08 NOTE — Telephone Encounter (Signed)
She should no longer be on plavix per chart review. This is the 3rd refill request we've received on this. Can we contact the patient to let her know that she is not supposed to be on this anymore?

## 2023-02-08 NOTE — Telephone Encounter (Signed)
Per chart patient taking coumadin advise to refill of Plavix.

## 2023-02-11 ENCOUNTER — Encounter: Payer: Medicare Other | Admitting: *Deleted

## 2023-02-11 ENCOUNTER — Telehealth: Payer: Self-pay | Admitting: Cardiovascular Disease

## 2023-02-11 DIAGNOSIS — I5022 Chronic systolic (congestive) heart failure: Secondary | ICD-10-CM | POA: Diagnosis not present

## 2023-02-11 DIAGNOSIS — G3184 Mild cognitive impairment, so stated: Secondary | ICD-10-CM | POA: Diagnosis not present

## 2023-02-11 DIAGNOSIS — I10 Essential (primary) hypertension: Secondary | ICD-10-CM | POA: Diagnosis not present

## 2023-02-11 DIAGNOSIS — Z8659 Personal history of other mental and behavioral disorders: Secondary | ICD-10-CM | POA: Diagnosis not present

## 2023-02-11 DIAGNOSIS — Z952 Presence of prosthetic heart valve: Secondary | ICD-10-CM | POA: Diagnosis not present

## 2023-02-11 NOTE — Telephone Encounter (Signed)
Lurena Joiner with [facility] clinic Neurology called in stating Janice Coffin, PA would like to speak to Dr. Excell Seltzer about starting this pt on a medication that could potentially lower her HR. She states it is not urgent but would like to start soon    Best c/b - [phone]

## 2023-02-11 NOTE — Telephone Encounter (Signed)
Returned call to [facility] who spoke via phone with Dr Excell Seltzer. Will close encounter.

## 2023-02-11 NOTE — Progress Notes (Signed)
Daily Session Note  Patient Details  Name: Shanita Po MRN: [id number] Date of Birth: 09-15-1951 Referring Provider:   Flowsheet Row Cardiac Rehab from 01/28/2023 in [facility] Cardiac and Pulmonary Rehab  Referring Provider Excell Seltzer       Encounter Date: 02/11/2023  Check In:  Session Check In - 02/11/23 1552       Check-In   Supervising physician immediately available to respond to emergencies See telemetry face sheet for immediately available ER MD    Location [facility]-Cardiac & Pulmonary Rehab    Staff Present Susann Givens, RN BSN;Joseph Reino Kent, RCP,RRT,BSRT;Noah Kipton, [place], Exercise Physiologist    Virtual Visit No    Medication changes reported     No    Fall or balance concerns reported    No    Warm-up and Cool-down Performed on first and last piece of equipment    Resistance Training Performed Yes    VAD Patient? No      Pain Assessment   Currently in Pain? No/denies                Social History   Tobacco Use  Smoking Status Former   Packs/day: 0.50   Years: 25.00   Additional pack years: 0.00   Total pack years: 12.50   Types: Cigarettes   Quit date: 04/13/1997   Years since quitting: 25.8  Smokeless Tobacco Never    Goals Met:  Independence with exercise equipment Exercise tolerated well No report of concerns or symptoms today Strength training completed today  Goals Unmet:  Not Applicable  Comments: Pt able to follow exercise prescription today without complaint.  Will continue to monitor for progression.    Dr. Bethann Punches is Medical Director for [facility] Cardiac Rehabilitation.  Dr. Vida Rigger is Medical Director for [facility] Pulmonary Rehabilitation.

## 2023-02-12 ENCOUNTER — Telehealth (HOSPITAL_BASED_OUTPATIENT_CLINIC_OR_DEPARTMENT_OTHER): Payer: Self-pay | Admitting: Pharmacist Clinician (PhC)/ Clinical Pharmacy Specialist

## 2023-02-12 NOTE — Telephone Encounter (Signed)
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 Should be on ASA 81 mg daily. thx

## 2023-02-13 ENCOUNTER — Ambulatory Visit: Payer: Medicare Other

## 2023-02-13 ENCOUNTER — Encounter: Payer: Medicare Other | Admitting: *Deleted

## 2023-02-13 ENCOUNTER — Ambulatory Visit: Payer: Medicare Other | Admitting: Family Medicine

## 2023-02-13 DIAGNOSIS — I5022 Chronic systolic (congestive) heart failure: Secondary | ICD-10-CM | POA: Diagnosis not present

## 2023-02-13 DIAGNOSIS — Z952 Presence of prosthetic heart valve: Secondary | ICD-10-CM

## 2023-02-13 NOTE — Progress Notes (Signed)
Daily Session Note  Patient Details  Name: Melanie Ray MRN: [id number] Date of Birth: [birth year] Referring Provider:   Flowsheet Row Cardiac Rehab from 01/28/2023 in [facility] Cardiac and Pulmonary Rehab  Referring Provider Excell Seltzer       Encounter Date: 02/13/2023  Check In:  Session Check In - 02/13/23 1542       Check-In   Supervising physician immediately available to respond to emergencies See telemetry face sheet for immediately available ER MD    Location [facility]-Cardiac & Pulmonary Rehab    Staff Present Susann Givens, RN Mabeline Caras, BS, ACSM CEP, Exercise Physiologist;Megan Katrinka Blazing, RN, ADN    Virtual Visit No    Medication changes reported     No    Fall or balance concerns reported    No    Warm-up and Cool-down Performed on first and last piece of equipment    Resistance Training Performed Yes    VAD Patient? No    PAD/SET Patient? No      Pain Assessment   Currently in Pain? No/denies                Social History   Tobacco Use  Smoking Status Former   Packs/day: 0.50   Years: 25.00   Additional pack years: 0.00   Total pack years: 12.50   Types: Cigarettes   Quit date: 04/13/1997   Years since quitting: 25.8  Smokeless Tobacco Never    Goals Met:  Independence with exercise equipment Exercise tolerated well No report of concerns or symptoms today Strength training completed today  Goals Unmet:  Not Applicable  Comments: Pt able to follow exercise prescription today without complaint.  Will continue to monitor for progression.    Dr. Bethann Punches is Medical Director for [facility] Cardiac Rehabilitation.  Dr. Vida Rigger is Medical Director for [facility] Pulmonary Rehabilitation.

## 2023-02-13 NOTE — Telephone Encounter (Signed)
Called spoke with pt, advised ok to stop the Warfarin and continue taking the 81mg  ASA once daily.  Pt verbalized understanding, and thanked me for the call.  Will cancel pt's follow-up appt in the [facility] today.

## 2023-02-13 NOTE — Telephone Encounter (Signed)
Warfarin discontinued from medication list.

## 2023-02-13 NOTE — Addendum Note (Signed)
Addended by: Satira Sark on: 02/13/2023 09:50 AM   Modules accepted: Orders

## 2023-02-15 ENCOUNTER — Other Ambulatory Visit: Payer: Self-pay | Admitting: Student

## 2023-02-15 DIAGNOSIS — G464 Cerebellar stroke syndrome: Secondary | ICD-10-CM

## 2023-02-15 DIAGNOSIS — G3184 Mild cognitive impairment, so stated: Secondary | ICD-10-CM

## 2023-02-18 ENCOUNTER — Encounter: Payer: Medicare Other | Admitting: *Deleted

## 2023-02-18 DIAGNOSIS — I5022 Chronic systolic (congestive) heart failure: Secondary | ICD-10-CM | POA: Diagnosis not present

## 2023-02-18 DIAGNOSIS — Z952 Presence of prosthetic heart valve: Secondary | ICD-10-CM | POA: Diagnosis not present

## 2023-02-18 NOTE — Progress Notes (Signed)
Daily Session Note  Patient Details  Name: Melanie Ray MRN: [id number] Date of Birth: 1950-11-10 Referring Provider:   Flowsheet Row Cardiac Rehab from 01/28/2023 in [facility] Cardiac and Pulmonary Rehab  Referring Provider Excell Seltzer       Encounter Date: 02/18/2023  Check In:  Session Check In - 02/18/23 1612       Check-In   Supervising physician immediately available to respond to emergencies See telemetry face sheet for immediately available ER MD    Location [facility]-Cardiac & Pulmonary Rehab    Staff Present Lanny Hurst, RN, ADN;Joseph Reino Kent, RCP,RRT,BSRT;Noah Tickle, BS, Exercise Physiologist    Virtual Visit No    Medication changes reported     No    Fall or balance concerns reported    No    Warm-up and Cool-down Performed on first and last piece of equipment    Resistance Training Performed Yes    VAD Patient? No    PAD/SET Patient? No      Pain Assessment   Currently in Pain? No/denies                Social History   Tobacco Use  Smoking Status Former   Packs/day: 0.50   Years: 25.00   Additional pack years: 0.00   Total pack years: 12.50   Types: Cigarettes   Quit date: 04/13/1997   Years since quitting: 25.8  Smokeless Tobacco Never    Goals Met:  Independence with exercise equipment Exercise tolerated well No report of concerns or symptoms today Strength training completed today  Goals Unmet:  Not Applicable  Comments: Pt able to follow exercise prescription today without complaint.  Will continue to monitor for progression.    Dr. Bethann Punches is Medical Director for [facility] Cardiac Rehabilitation.  Dr. Vida Rigger is Medical Director for [facility] Pulmonary Rehabilitation.

## 2023-02-20 ENCOUNTER — Encounter: Payer: Self-pay | Admitting: *Deleted

## 2023-02-20 ENCOUNTER — Encounter: Payer: Medicare Other | Admitting: *Deleted

## 2023-02-20 DIAGNOSIS — I5022 Chronic systolic (congestive) heart failure: Secondary | ICD-10-CM | POA: Diagnosis not present

## 2023-02-20 DIAGNOSIS — Z952 Presence of prosthetic heart valve: Secondary | ICD-10-CM

## 2023-02-20 NOTE — Progress Notes (Signed)
Daily Session Note  Patient Details  Name: Melanie Ray MRN: [id number] Date of Birth: November 09, 1950 Referring Provider:   Flowsheet Row Cardiac Rehab from 01/28/2023 in [facility] Cardiac and Pulmonary Rehab  Referring Provider Excell Seltzer       Encounter Date: 02/20/2023  Check In:  Session Check In - 02/20/23 1550       Check-In   Supervising physician immediately available to respond to emergencies See telemetry face sheet for immediately available ER MD    Location [facility]-Cardiac & Pulmonary Rehab    Staff Present Susann Givens, RN Mabeline Caras, BS, ACSM CEP, Exercise Physiologist;Joseph Reino Kent, [place]    Virtual Visit No    Medication changes reported     No    Fall or balance concerns reported    No    Warm-up and Cool-down Performed on first and last piece of equipment    Resistance Training Performed Yes    VAD Patient? No    PAD/SET Patient? No      Pain Assessment   Currently in Pain? No/denies                Social History   Tobacco Use  Smoking Status Former   Packs/day: 0.50   Years: 25.00   Additional pack years: 0.00   Total pack years: 12.50   Types: Cigarettes   Quit date: 04/13/1997   Years since quitting: 25.8  Smokeless Tobacco Never    Goals Met:  Independence with exercise equipment Exercise tolerated well No report of concerns or symptoms today Strength training completed today  Goals Unmet:  Not Applicable  Comments: Pt able to follow exercise prescription today without complaint.  Will continue to monitor for progression.    Dr. Bethann Punches is Medical Director for [facility] Cardiac Rehabilitation.  Dr. Vida Rigger is Medical Director for [facility] Pulmonary Rehabilitation.

## 2023-02-20 NOTE — Progress Notes (Signed)
Cardiac Individual Treatment Plan  Patient Details  Name: Melanie Ray MRN: [id number] Date of Birth: 1951/02/10 Referring Provider:   Flowsheet Row Cardiac Rehab from 01/28/2023 in [facility] Cardiac and Pulmonary Rehab  Referring Provider Excell Seltzer       Initial Encounter Date:  Flowsheet Row Cardiac Rehab from 01/28/2023 in [facility] Cardiac and Pulmonary Rehab  Date 01/28/23       Visit Diagnosis: S/P mitral valve replacement  Patient's Home Medications on Admission:  Current Outpatient Medications:    aspirin EC 81 MG tablet, Take 1 tablet (81 mg total) by mouth daily. Swallow whole., Disp: 30 tablet, Rfl: 12   atorvastatin (LIPITOR) 80 MG tablet, Take 1 tablet by mouth once daily, Disp: 90 tablet, Rfl: 0   Cariprazine HCl (VRAYLAR) 1.5 & 3 MG CPPK, Take by mouth as needed., Disp: , Rfl:    carvedilol (COREG) 12.5 MG tablet, Take 0.5 tablets (6.25 mg total) by mouth 2 (two) times daily with a meal., Disp: 60 tablet, Rfl: 1   dorzolamide-timolol (COSOPT) 22.3-6.8 MG/ML ophthalmic solution, Place 1 drop into both eyes 2 (two) times daily. , Disp: , Rfl:    empagliflozin ([name]) 10 MG TABS tablet, Take 1 tablet (10 mg total) by mouth daily., Disp: 30 tablet, Rfl: 1   furosemide (LASIX) 40 MG tablet, Take 40 mg by mouth as needed., Disp: , Rfl:    gabapentin (NEURONTIN) 100 MG capsule, Take 100 mg by mouth 2 (two) times daily., Disp: , Rfl:    irbesartan (AVAPRO) 75 MG tablet, Take 1/2 tablet (37.5 mg total) by mouth daily., Disp: 30 tablet, Rfl: 1   latanoprost (XALATAN) 0.005 % ophthalmic solution, Place 1 drop into both eyes at bedtime., Disp: , Rfl:    potassium chloride SA (KLOR-CON M) 20 MEQ tablet, Take one tablet when you take prn Lasix, Disp: 30 tablet, Rfl: 3   spironolactone (ALDACTONE) 25 MG tablet, Take 0.5 tablets (12.5 mg total) by mouth daily., Disp: 45 tablet, Rfl: 3   traZODone (DESYREL) 150 MG tablet, Take 150 mg by mouth at bedtime., Disp: , Rfl:   Past Medical  History: Past Medical History:  Diagnosis Date   AICD (automatic cardioverter/defibrillator) present    Anemia    Anxiety    Back pain    CAD (coronary artery disease) 04/11/2016   S/p ant STEMI 5/17: LHC >> LAD proximal 80%, mid 80%, distal 50%, ostial D1 60%; LCx with LPDA lesion 30%; RCA Mild calcification with no significant stenosis in a medium caliber, nondominant RCA; LVEF is estimated at 45% with inferoapical and lateral wall akinesis >> PCI: PCI: 3.5 x 24 mm Promus DES to prox LAD, 2.5 x 12 mm Promus DES to mid LAD. // Myoview 7/21: EF 20, no ischemia; high risk    Chest pain    Chronic systolic CHF (congestive heart failure) (HCC) 03/21/2016   Echo 01/30/17: Diff HK, mild focal basal septal hypertrophy, EF 30-35, mild AI, MAC, mild MR // Echo 06/08/16: Mild focal basal septal hypertrophy, EF 25-30%, diff HK, ant-septal AK, Gr 1 DD, mild AI, MAC, mild MR, PASP 37 mmHg // Echo 03/18/16: EF 30-35%, ant-septal AK, Gr 1 DD, mild MR, severe LAE.   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 Excell Seltzer and Dr. Lynnette Caffey   Sleep apnea    SOB (shortness of breath)    Stroke (HCC)    2021   Suicidal ideation 08/27/2018   Swelling    feet or legs    Tobacco Use: Social History   Tobacco Use  Smoking Status Former   Packs/day: 0.50   Years: 25.00   Additional pack years: 0.00   Total pack years: 12.50   Types: Cigarettes   Quit date: 04/13/1997    Years since quitting: 25.8  Smokeless Tobacco Never    Labs: Review Flowsheet  More data exists      Latest Ref Rng & Units 08/02/2022 10/15/2022 10/24/2022 10/25/2022 10/26/2022  Labs for ITP Cardiac and Pulmonary Rehab  PH, Arterial 7.35 - 7.45 7.359  7.418  - 7.316  7.349  7.348  7.380  7.437  7.379  7.263  - -  PCO2 arterial 32 - 48 mmHg 43.5  29.4  - 40.6  38.3  35.2  35.7  32.0  34.0  42.2  - -  Bicarbonate 20.0 - 28.0 mmol/L 23.2  24.5  19.0  25.4  25.8  20.7  21.1  19.3  21.4  21.6  27.8  20.0  19.1  - -  TCO2 22 - 32 mmol/L 24  26  20  27  27  22  22  20  23  23  24   49  29  21  20  20  23   - -  Acid-base deficit 0.0 - 2.0 mmol/L 2.0  1.0  5.0  - 5.0  4.0  6.0  4.0  2.0  5.0  7.0  - -  O2 Saturation % 53  53  98  64  64  98  79  98  98  93  100  81  100  100  70.1  78.2      Exercise Target Goals: Exercise Program Goal: Individual exercise prescription set using results from initial 6 min walk test and THRR while considering  patient's activity barriers and safety.   Exercise Prescription Goal: Initial exercise prescription builds to 30-45 minutes a day of aerobic activity, 2-3 days per week.  Home exercise guidelines will be given to patient during program as part of exercise prescription that the participant will acknowledge.   Education: Aerobic Exercise: - Group verbal and visual presentation on the components of exercise prescription. Introduces F.I.T.T principle from ACSM for exercise prescriptions.  Reviews F.I.T.T. principles of aerobic exercise including progression. Written material given at graduation.   Education: Resistance Exercise: - Group verbal and visual presentation on the components of exercise prescription. Introduces F.I.T.T principle from ACSM for exercise prescriptions  Reviews F.I.T.T. principles of resistance exercise including progression. Written material given at graduation.    Education: Exercise & Equipment Safety: - Individual verbal  instruction and demonstration of equipment use and safety with use of the equipment. Flowsheet Row Cardiac Rehab from 01/28/2023 in [facility] Cardiac and Pulmonary Rehab  Date 01/28/23  Educator  KH  Instruction Review Code 1- Verbalizes Understanding       Education: Exercise Physiology & General Exercise Guidelines: - Group verbal and written instruction with models to review the exercise physiology of the cardiovascular system and associated critical values. Provides general exercise guidelines with specific guidelines to those with heart or lung disease.    Education: Flexibility, Balance, Mind/Body Relaxation: - Group verbal and visual presentation with interactive activity on the components of exercise prescription. Introduces F.I.T.T principle from ACSM for exercise prescriptions. Reviews F.I.T.T. principles of flexibility and balance exercise training including progression. Also discusses the mind body connection.  Reviews various relaxation techniques to help reduce and manage stress (i.e. Deep breathing, progressive muscle relaxation, and visualization). Balance handout provided to take home. Written material given at graduation.   Activity Barriers & Risk Stratification:  Activity Barriers & Cardiac Risk Stratification - 01/28/23 1704       Activity Barriers & Cardiac Risk Stratification   Activity Barriers None    Cardiac Risk Stratification High             6 Minute Walk:  6 Minute Walk     Row Name 01/28/23 1649         6 Minute Walk   Phase Initial     Distance 820 feet     Walk Time 6 minutes     # of Rest Breaks 0     MPH 1.55     METS 1.87     RPE 11     Perceived Dyspnea  1     VO2 Peak 6.54     Symptoms No     Resting HR 85 bpm     Resting BP 124/72     Resting Oxygen Saturation  100 %     Exercise Oxygen Saturation  during 6 min walk 100 %     Max Ex. HR 91 bpm     Max Ex. BP 112/60     2 Minute Post BP 104/60              Oxygen Initial  Assessment:   Oxygen Re-Evaluation:   Oxygen Discharge (Final Oxygen Re-Evaluation):   Initial Exercise Prescription:  Initial Exercise Prescription - 01/28/23 1700       Date of Initial Exercise RX and Referring Provider   Date 01/28/23    Referring Provider Excell Seltzer      Oxygen   Maintain Oxygen Saturation 88% or higher      Treadmill   MPH 1.2    Grade 0    Minutes 15    METs 1.92      Recumbant Bike   Level 1    RPM 50    Watts 15    Minutes 15      NuStep   Level 1    SPM 80    Minutes 15    METs 1.87      T5 Nustep   Level 1    SPM 80    Minutes 15    METs 1.87      Biostep-RELP   Level 1    SPM 50    Minutes 15    METs 1.87      Prescription Details   Frequency (times per week) 2    Duration Progress to 30 minutes of continuous aerobic without signs/symptoms of physical distress      Intensity   THRR 40-80% of Max Heartrate 110-135    Ratings of Perceived  Exertion 11-13    Perceived Dyspnea 0-4      Progression   Progression Continue to progress workloads to maintain intensity without signs/symptoms of physical distress.      Resistance Training   Training Prescription Yes    Weight 2    Reps 10-15             Perform Capillary Blood Glucose checks as needed.  Exercise Prescription Changes:   Exercise Prescription Changes     Row Name 01/28/23 1700 02/11/23 1400           Response to Exercise   Blood Pressure (Admit) 124/72 132/62      Blood Pressure (Exercise) 112/60 136/60      Blood Pressure (Exit) 104/60 130/64      Heart Rate (Admit) 85 bpm 71 bpm      Heart Rate (Exercise) 91 bpm 94 bpm      Heart Rate (Exit) 86 bpm 82 bpm      Oxygen Saturation (Admit) 100 % --      Oxygen Saturation (Exercise) 100 % --      Oxygen Saturation (Exit) 100 % --      Rating of Perceived Exertion (Exercise) 11 13      Perceived Dyspnea (Exercise) 1 --      Symptoms none none      Comments 6 MWT results 2nd full day of exercise       Duration -- Progress to 30 minutes of  aerobic without signs/symptoms of physical distress      Intensity -- THRR unchanged        Progression   Progression -- Continue to progress workloads to maintain intensity without signs/symptoms of physical distress.      Average METs -- 1.72        Resistance Training   Training Prescription -- Yes      Weight -- 2 lb      Reps -- 10-15        Interval Training   Interval Training -- No        NuStep   Level -- 1      Minutes -- 15      METs -- 1.9        T5 Nustep   Level -- 1      Minutes -- 15      METs -- 1.8        Track   Laps -- 14      Minutes -- 15      METs -- 1.76        Oxygen   Maintain Oxygen Saturation -- 88% or higher               Exercise Comments:   Exercise Comments     Row Name 02/05/23 1007           Exercise Comments First full day of exercise!  Patient was oriented to gym and equipment including functions, settings, policies, and procedures.  Patient's individual exercise prescription and treatment plan were reviewed.  All starting workloads were established based on the results of the 6 minute walk test done at initial orientation visit.  The plan for exercise progression was also introduced and progression will be customized based on patient's performance and goals.                Exercise Goals and Review:   Exercise Goals     Row Name 01/28/23 [phone]  Exercise Goals   Increase Physical Activity Yes       Intervention Provide advice, education, support and counseling about physical activity/exercise needs.;Develop an individualized exercise prescription for aerobic and resistive training based on initial evaluation findings, risk stratification, comorbidities and participant's personal goals.       Expected Outcomes Short Term: Attend rehab on a regular basis to increase amount of physical activity.;Long Term: Add in home exercise to make exercise part of routine and  to increase amount of physical activity.;Long Term: Exercising regularly at least 3-5 days a week.       Increase Strength and Stamina Yes       Intervention Provide advice, education, support and counseling about physical activity/exercise needs.;Develop an individualized exercise prescription for aerobic and resistive training based on initial evaluation findings, risk stratification, comorbidities and participant's personal goals.       Expected Outcomes Short Term: Increase workloads from initial exercise prescription for resistance, speed, and METs.;Short Term: Perform resistance training exercises routinely during rehab and add in resistance training at home;Long Term: Improve cardiorespiratory fitness, muscular endurance and str4>[id number] [id number] InTamp3>86913PamMarland [id number] Spe1>86770PamMarland Kitche19n604>86725PamMarland Kitche35>86562PamMarland Kitche82n60454>7>86820PamMarland Kitche5n60454[facility] InsU7>86731PamMarland Kitche75n60454Madonna Reha4>86562PamMarland Kitche66n60454Ports7>86303PamMarland Kitche53n60454Garde2>86217PamMarland Kitche40n6042>86937PamMarland2>86810PamMarland8>86863PamMarland Kitche44n60454St F>86315PamMarland Kitche487>86308PamMarland Kitche52n60454[facility] CoCh2>86(647)PamMarland Kitc7>865>86781PamMarland Ki2>86281PamMarland Kitche20n60454[facility] JosephsEnc1>86(779)PamMarland Kitche31n60454Ann & Robert H Luri5>86306PamMarland Kitche62n6044>86812PamMarland Ki>8(989PamMarland [id number] He>86(317PamMarland Kitche1>86423PamM1>86520PamMar2>86563PamMarland Kitche>862PamMarland Kitche63>86250PamMarland K3>863PamMarland Kitche59n60454Cent5>86564PamMarland Kitche40n60454[facility] LLCedMichaell CowingVMarnee Spring Inc.remier Endoscopy LLCesMichaell CowingV a8[name]( Provide education and explanation on how to use RPE scale       Expected Outcomes Short Term: Able to use RPE daily in rehab to express subjective intensity level;Long Term:  Able to use RPE to guide intensity level when exercising independently       Able to understand and use Dyspnea scale Yes       Intervention Provide education and explanation on how to use Dyspnea scale       Expected Outcomes Short Term: Able to use Dyspnea scale daily in rehab to express subjective sense of shortness of breath during exertion;Long Term: Able to use Dyspnea scale to guide intensity level when exercising independently       Knowledge and understanding of Target Heart Rate Range (THRR) Yes       Intervention Provide education and explanation of THRR including how the numbers were predicted and where they are located for reference       Expected Outcomes Short Term: Able to state/look up THRR;Long Term: Able to use THRR to govern intensity when exercising independently;Short Term: Able to use daily as guideline for intensity in  rehab       Able to check pulse independently Yes       Intervention Provide education and demonstration on how to check pulse in carotid and radial arteries.;Review the importance of being able to check your own pulse for safety during independent exercise       Expected Outcomes Short Term: Able to explain why pulse checking is important during independent exercise;Long Term: Able to check pulse independently and accurately       Understanding of Exercise Prescription Yes       Intervention Provide education, explanation, and written materials on patient's individual exercise prescription       Expected Outcomes Short Term: Able to explain program exercise prescription;Long Term: Able to explain home exercise prescription to exercise independently                Exercise Goals Re-Evaluation :  Exercise Goals Re-Evaluation     Row Name 02/05/23 1007 02/11/23 1436           Exercise Goal Re-Evaluation  Exercise Goals Review Increase Physical Activity;Able to understand and use rate of perceived exertion (RPE) scale;Knowledge and understanding of Target Heart Rate Range (THRR);Understanding of Exercise Prescription;Increase Strength and Stamina;Able to check pulse independently Increase Physical Activity;Increase Strength and Stamina;Understanding of Exercise Prescription      Comments Reviewed RPE scale, THR and program prescription with pt today.  Pt voiced understanding and was given a copy of goals to take home. Patient is doing well for the first couple of times she has been at rehab. She was able to get 14 laps on the track and exercise the rest at her initial exercise prescription. She had appropriate RPEs throughout the sessions. We will continue to monitor as she progresses in the program.      Expected Outcomes Short: Use RPE daily to regulate intensity.  Long: Follow program prescription in THR. Short: Continue to work at initial exercise prescription Long: Increase overall MET  level and stamina               Discharge Exercise Prescription (Final Exercise Prescription Changes):  Exercise Prescription Changes - 02/11/23 1400       Response to Exercise   Blood Pressure (Admit) 132/62    Blood Pressure (Exercise) 136/60    Blood Pressure (Exit) 130/64    Heart Rate (Admit) 71 bpm    Heart Rate (Exercise) 94 bpm    Heart Rate (Exit) 82 bpm    Rating of Perceived Exertion (Exercise) 13    Symptoms none    Comments 2nd full day of exercise    Duration Progress to 30 minutes of  aerobic without signs/symptoms of physical distress    Intensity THRR unchanged      Progression   Progression Continue to progress workloads to maintain intensity without signs/symptoms of physical distress.    Average METs 1.72      Resistance Training   Training Prescription Yes    Weight 2 lb    Reps 10-15      Interval Training   Interval Training No      NuStep   Level 1    Minutes 15    METs 1.9      T5 Nustep   Level 1    Minutes 15    METs 1.8      Track   Laps 14    Minutes 15    METs 1.76      Oxygen   Maintain Oxygen Saturation 88% or higher             Nutrition:  Target Goals: Understanding of nutrition guidelines, daily intake of sodium 1500mg , cholesterol 200mg , calories 30% from fat and 7% or less from saturated fats, daily to have 5 or more servings of fruits and vegetables.  Education: All About Nutrition: -Group instruction provided by verbal, written material, interactive activities, discussions, models, and posters to present general guidelines for heart healthy nutrition including fat, fiber, MyPlate, the role of sodium in heart healthy nutrition, utilization of the nutrition label, and utilization of this knowledge for meal planning. Follow up email sent as well. Written material given at graduation.   Biometrics:  Pre Biometrics - 01/28/23 1716       Pre Biometrics   Height  (1.6 m)    Weight 132 lb 1.6 oz (59.9 kg)     Waist Circumference 33 inches    Hip Circumference 36.5 inches    Waist to Hip Ratio 0.9 %    BMI (Calculated) 23.41  Single Leg Stand 7.09 seconds              Nutrition Therapy Plan and Nutrition Goals:  Nutrition Therapy & Goals - 01/28/23 1554       Nutrition Therapy   Diet Heart healthy, low Na, T2DM MNT    Drug/Food Interactions Statins/Certain Fruits;Coumadin/Vit K    Protein (specify units) 75g    Fiber 25 grams    Whole Grain Foods 3 servings    Saturated Fats 12 max. grams    Fruits and Vegetables 8 servings/day    Sodium 2 grams      Personal Nutrition Goals   Nutrition Goal ST: compare 3-4 days of eating with list of foods high in vitamin K to see how much you are eating, read/compare food labels, review paperwork LT: manage INR, maintain A1C <7, limit sodium <2g/day, follow MyPlate guidelines    Comments 72 y.o. F admitted to cardiac rehab for chronic systolic CHF. PMHx includes CAD, HLD, anemia, stroke, severe mitral insufficiency, severe mitral regurgitation, T2DM, osteopenia, anxiety/depression. Medications reviewed atorvastatin, jardiance, furosemide, potassium-chloride, trazodone, warfarin. B: [place] sausage and eggs D: chicken or fish or beef with a vegetables. She reports using butter, she will use seasoning salt with her meat. She may also have cold cuts. S:she reports having a sweet tooth - she will have a cookie or 1/2 cup of ice cream and she may have reeses PB cup. Drinks: water and diet coke (not even a whole bottle per day and sweet tea (1/4 cup usually). Discussed heart healthy eating and diabetes friendly eating. Zuri would like to work on limiting sodium - reviewed label reading; she feels confident in label reading. Discussed warfarin and vitamin K consistency.      Intervention Plan   Intervention Prescribe, educate and counsel regarding individualized specific dietary modifications aiming towards targeted core components such as weight,  hypertension, lipid management, diabetes, heart failure and other comorbidities.;Nutrition handout(s) given to patient.    Expected Outcomes Short Term Goal: Understand basic principles of dietary content, such as calories, fat, sodium, cholesterol and nutrients.;Short Term Goal: A plan has been developed with personal nutrition goals set during dietitian appointment.;Long Term Goal: Adherence to prescribed nutrition plan.             Nutrition Assessments:  MEDIFICTS Score Key: ?70 Need to make dietary changes  40-70 Heart Healthy Diet ? 40 Therapeutic Level Cholesterol Diet  Flowsheet Row Cardiac Rehab from 02/05/2023 in [facility] Cardiac and Pulmonary Rehab  Picture Your Plate Total Score on Admission 51      Picture Your Plate Scores: <16 Unhealthy dietary pattern with much room for improvement. 41-50 Dietary pattern unlikely to meet recommendations for good health and room for improvement. 51-60 More healthful dietary pattern, with some room for improvement.  >60 Healthy dietary pattern, although there may be some specific behaviors that could be improved.    Nutrition Goals Re-Evaluation:   Nutrition Goals Discharge (Final Nutrition Goals Re-Evaluation):   Psychosocial: Target Goals: Acknowledge presence or absence of significant depression and/or stress, maximize coping skills, provide positive support system. Participant is able to verbalize types and ability to use techniques and skills needed for reducing stress and depression.   Education: Stress, Anxiety, and Depression - Group verbal and visual presentation to define topics covered.  Reviews how body is impacted by stress, anxiety, and depression.  Also discusses healthy ways to reduce stress and to treat/manage anxiety and depression.  Written material given at graduation.  Education: Sleep Hygiene -Provides group verbal and written instruction about how sleep can affect your health.  Define sleep hygiene, discuss  sleep cycles and impact of sleep habits. Review good sleep hygiene tips.    Initial Review & Psychosocial Screening:  Initial Psych Review & Screening - 12/26/22 1345       Initial Review   Current issues with History of Depression;Current Depression;Current Anxiety/Panic      Family Dynamics   Good Support System? Yes    Comments Her depression and anxiety does not stem from any one thing. Sadee can look to her sister, brother and husband. She is ready to get her health on track and start rehab.      Barriers   Psychosocial barriers to participate in program The patient should benefit from training in stress management and relaxation.      Screening Interventions   Interventions Encouraged to exercise;Provide feedback about the scores to participant;To provide support and resources with identified psychosocial needs    Expected Outcomes Short Term goal: Utilizing psychosocial counselor, staff and physician to assist with identification of specific Stressors or current issues interfering with healing process. Setting desired goal for each stressor or current issue identified.;Long Term Goal: Stressors or current issues are controlled or eliminated.;Short Term goal: Identification and review with participant of any Quality of Life or Depression concerns found by scoring the questionnaire.;Long Term goal: The participant improves quality of Life and PHQ9 Scores as seen by post scores and/or verbalization of changes             Quality of Life Scores:   Quality of Life - 02/05/23 1016       Quality of Life   Select Quality of Life      Quality of Life Scores   Health/Function Pre 16.5 %    Socioeconomic Pre 29.14 %    Psych/Spiritual Pre 16 %    Family Pre 26 %    GLOBAL Pre 20.57 %            Scores of 19 and below usually indicate a poorer quality of life in these areas.  A difference of  2-3 points is a clinically meaningful difference.  A difference of 2-3 points in  the total score of the Quality of Life Index has been associated with significant improvement in overall quality of life, self-image, physical symptoms, and general health in studies assessing change in quality of life.  PHQ-9: Review Flowsheet  More data exists      01/28/2023 01/02/2023 12/28/2022 09/25/2022 05/07/2022  Depression screen PHQ 2/9  Decreased Interest 1 3 3 3  0  Down, Depressed, Hopeless 1 3 3 3  0  PHQ - 2 Score 2 6 6 6  0  Altered sleeping 1 3 3  - -  Tired, decreased energy 1 3 3  - -  Change in appetite 0 3 0 - -  Feeling bad or failure about yourself  0 2 3 - -  Trouble concentrating 0 2 0 - -  Moving slowly or fidgety/restless 0 2 1 - -  Suicidal thoughts 0 3 2 - -  PHQ-9 Score 4 24 18  - -  Difficult doing work/chores Not difficult at all Somewhat difficult Somewhat difficult - -   Interpretation of Total Score  Total Score Depression Severity:  1-4 = Minimal depression, 5-9 = Mild depression, 10-14 = Moderate depression, 15-19 = Moderately severe depression, 20-27 = Severe depression   Psychosocial Evaluation and Intervention:  Psychosocial Evaluation -  12/26/22 1349       Psychosocial Evaluation & Interventions   Interventions Encouraged to exercise with the program and follow exercise prescription;Relaxation education;Stress management education    Comments Her depression and anxiety does not stem from any one thing. Rekita can look to her sister, brother and husband. She is ready to get her health on track and start rehab.    Expected Outcomes Short: Start [facility] to help with mood. Long: Maintain a healthy mental state    Continue Psychosocial Services  Follow up required by staff             Psychosocial Re-Evaluation:   Psychosocial Discharge (Final Psychosocial Re-Evaluation):   Vocational Rehabilitation: Provide vocational rehab assistance to qualifying candidates.   Vocational Rehab Evaluation & Intervention:   Education: Education  Goals: Education classes will be provided on a variety of topics geared toward better understanding of heart health and risk factor modification. Participant will state understanding/return demonstration of topics presented as noted by education test scores.  Learning Barriers/Preferences:  Learning Barriers/Preferences - 12/26/22 1342       Learning Barriers/Preferences   Learning Barriers None    Learning Preferences None             General Cardiac Education Topics:  AED/CPR: - Group verbal and written instruction with the use of models to demonstrate the basic use of the AED with the basic ABC's of resuscitation.   Anatomy and Cardiac Procedures: - Group verbal and visual presentation and models provide information about basic cardiac anatomy and function. Reviews the testing methods done to diagnose heart disease and the outcomes of the test results. Describes the treatment choices: Medical Management, Angioplasty, or Coronary Bypass Surgery for treating various heart conditions including Myocardial Infarction, Angina, Valve Disease, and Cardiac Arrhythmias.  Written material given at graduation.   Medication Safety: - Group verbal and visual instruction to review commonly prescribed medications for heart and lung disease. Reviews the medication, class of the drug, and side effects. Includes the steps to properly store meds and maintain the prescription regimen.  Written material given at graduation.   Intimacy: - Group verbal instruction through game format to discuss how heart and lung disease can affect sexual intimacy. Written material given at graduation..   Know Your Numbers and Heart Failure: - Group verbal and visual instruction to discuss disease risk factors for cardiac and pulmonary disease and treatment options.  Reviews associated critical values for Overweight/Obesity, Hypertension, Cholesterol, and Diabetes.  Discusses basics of heart failure: signs/symptoms and  treatments.  Introduces Heart Failure Zone chart for action plan for heart failure.  Written material given at graduation.   Infection Prevention: - Provides verbal and written material to individual with discussion of infection control including proper hand washing and proper equipment cleaning during exercise session. Flowsheet Row Cardiac Rehab from 01/28/2023 in [facility] Cardiac and Pulmonary Rehab  Date 01/28/23  Educator [facility]  Instruction Review Code 1- Verbalizes Understanding       Falls Prevention: - Provides verbal and written material to individual with discussion of falls prevention and safety. Flowsheet Row Cardiac Rehab from 01/28/2023 in [facility] Cardiac and Pulmonary Rehab  Date 01/28/23  Educator [facility]  Instruction Review Code 1- Verbalizes Understanding       Other: -Provides group and verbal instruction on various topics (see comments)   Knowledge Questionnaire Score:  Knowledge Questionnaire Score - 02/05/23 1016       Knowledge Questionnaire Score   Pre Score 22/26  Core Components/Risk Factors/Patient Goals at Admission:  Personal Goals and Risk Factors at Admission - 01/28/23 1716       Core Components/Risk Factors/Patient Goals on Admission    Weight Management Yes;Weight Maintenance    Intervention Weight Management: Develop a combined nutrition and exercise program designed to reach desired caloric intake, while maintaining appropriate intake of nutrient and fiber, sodium and fats, and appropriate energy expenditure required for the weight goal.;Weight Management: Provide education and appropriate resources to help participant work on and attain dietary goals.;Weight Management/Obesity: Establish reasonable short term and long term weight goals.    Admit Weight 132 lb 1.6 oz (59.9 kg)    Goal Weight: Short Term 132 lb (59.9 kg)    Goal Weight: Long Term 132 lb (59.9 kg)    Expected Outcomes Short Term: Continue to assess and modify interventions  until short term weight is achieved;Long Term: Adherence to nutrition and physical activity/exercise program aimed toward attainment of established weight goal;Weight Maintenance: Understanding of the daily nutrition guidelines, which includes 25-35% calories from fat, 7% or less cal from saturated fats, less than  cholesterol, less than 1.5gm of sodium, & 5 or more servings of fruits and vegetables daily;Understanding recommendations for meals to include 15-35% energy as protein, 25-35% energy from fat, 35-60% energy from carbohydrates, less than  of dietary cholesterol, 20-35 gm of total fiber daily;Understanding of distribution of calorie intake throughout the day with the consumption of 4-5 meals/snacks    Diabetes Yes    Intervention Provide education about signs/symptoms and action to take for hypo/hyperglycemia.;Provide education about proper nutrition, including hydration, and aerobic/resistive exercise prescription along with prescribed medications to achieve blood glucose in normal ranges: Fasting glucose 65-99 mg/dL    Expected Outcomes Short Term: Participant verbalizes understanding of the signs/symptoms and immediate care of hyper/hypoglycemia, proper foot care and importance of medication, aerobic/resistive exercise and nutrition plan for blood glucose control.;Long Term: Attainment of HbA1C < 7%.    Heart Failure Yes    Intervention Provide a combined exercise and nutrition program that is supplemented with education, support and counseling about heart failure. Directed toward relieving symptoms such as shortness of breath, decreased exercise tolerance, and extremity edema.    Expected Outcomes Improve functional capacity of life;Short term: Attendance in program 2-3 days a week with increased exercise capacity. Reported lower sodium intake. Reported increased fruit and vegetable intake. Reports medication compliance.;Long term: Adoption of self-care skills and reduction of barriers  for early signs and symptoms recognition and intervention leading to self-care maintenance.;Short term: Daily weights obtained and reported for increase. Utilizing diuretic protocols set by physician.    Hypertension Yes    Intervention Provide education on lifestyle modifcations including regular physical activity/exercise, weight management, moderate sodium restriction and increased consumption of fresh fruit, vegetables, and low fat dairy, alcohol moderation, and smoking cessation.;Monitor prescription use compliance.    Expected Outcomes Short Term: Continued assessment and intervention until BP is < 140/44mm HG in hypertensive participants. < 130/71mm HG in hypertensive participants with diabetes, heart failure or chronic kidney disease.;Long Term: Maintenance of blood pressure at goal levels.    Lipids Yes    Intervention Provide education and support for participant on nutrition & aerobic/resistive exercise along with prescribed medications to achieve LDL 70mg , HDL >40mg .    Expected Outcomes Short Term: Participant states understanding of desired cholesterol values and is compliant with medications prescribed. Participant is following exercise prescription and nutrition guidelines.;Long Term: Cholesterol controlled with medications as prescribed, with individualized exercise  RX and with personalized nutrition plan. Value goals: LDL < 70mg , HDL > 40 mg.             Education:Diabetes - Individual verbal and written instruction to review signs/symptoms of diabetes, desired ranges of glucose level fasting, after meals and with exercise. Acknowledge that pre and post exercise glucose checks will be done for 3 sessions at entry of program. Flowsheet Row Cardiac Rehab from 01/28/2023 in [facility] Cardiac and Pulmonary Rehab  Date 01/28/23  Educator [facility]  Instruction Review Code 1- Verbalizes Understanding       Core Components/Risk Factors/Patient Goals Review:    Core Components/Risk  Factors/Patient Goals at Discharge (Final Review):    ITP Comments:  ITP Comments     Row Name 12/26/22 1341 01/28/23 1647 02/05/23 1006 02/20/23 1428     ITP Comments Virtual Visit completed. Patient informed on EP and RD appointment and 6 Minute walk test. Patient also informed of patient health questionnaires on My Chart. Patient Verbalizes understanding. Visit diagnosis can be found in [facility] 10/24/2022. Completed and gym orientation. Initial ITP created and sent for review to Dr. Daniel Nones, Medical Director. First full day of exercise!  Patient was oriented to gym and equipment including functions, settings, policies, and procedures.  Patient's individual exercise prescription and treatment plan were reviewed.  All starting workloads were established based on the results of the 6 minute walk test done at initial orientation visit.  The plan for exercise progression was also introduced and progression will be customized based on patient's performance and goals. 30 day review completed. ITP sent to Dr. Bethann Punches, Medical Director of Cardiac Rehab. Continue with ITP unless changes are made by physician.             Comments: 30 day review

## 2023-02-22 ENCOUNTER — Telehealth: Payer: Self-pay | Admitting: Family Medicine

## 2023-02-22 NOTE — Telephone Encounter (Signed)
I am not aware of any procedure she is having. There was an echo ordered that is being done Monday though I did not order this. They need to call the cardiology office for this as they ordered this study. I will also forward to the provider that ordered it.

## 2023-02-22 NOTE — Telephone Encounter (Signed)
[facility] [phone]) states this message was a mistake and needs to be disregarded.

## 2023-02-22 NOTE — Telephone Encounter (Signed)
[facility] called in staying that pt its having a procedure on Monday and they need PA for that. As per the rep, its ok to put into pt chart.

## 2023-02-25 ENCOUNTER — Ambulatory Visit: Payer: Medicare Other | Attending: Family Medicine

## 2023-02-25 DIAGNOSIS — I5022 Chronic systolic (congestive) heart failure: Secondary | ICD-10-CM | POA: Diagnosis not present

## 2023-02-25 LAB — ECHOCARDIOGRAM COMPLETE
AR max vel: 2.08 cm2
AV Area VTI: 1.99 cm2
AV Area mean vel: 2.06 cm2
AV Mean grad: 7 mmHg
AV Peak grad: 12.1 mmHg
Ao pk vel: 1.74 m/s
Area-P 1/2: 3.31 cm2
MV VTI: 1.36 cm2
P 1/2 time: 372 msec
S' Lateral: 3.9 cm

## 2023-02-27 ENCOUNTER — Encounter: Payer: Medicare Other | Admitting: *Deleted

## 2023-02-27 DIAGNOSIS — Z952 Presence of prosthetic heart valve: Secondary | ICD-10-CM | POA: Diagnosis not present

## 2023-02-27 DIAGNOSIS — I5022 Chronic systolic (congestive) heart failure: Secondary | ICD-10-CM | POA: Diagnosis not present

## 2023-02-27 NOTE — Progress Notes (Signed)
Daily Session Note  Patient Details  Name: Melanie Ray MRN: [id number] Date of Birth: 11-28-1950 Referring Provider:   Flowsheet Row Cardiac Rehab from 01/28/2023 in [facility] Cardiac and Pulmonary Rehab  Referring Provider Excell Seltzer       Encounter Date: 02/27/2023  Check In:  Session Check In - 02/27/23 1614       Check-In   Supervising physician immediately available to respond to emergencies See telemetry face sheet for immediately available ER MD    Location [facility]-Cardiac & Pulmonary Rehab    Staff Present Susann Givens, RN BSN;Joseph Washburn, RCP,RRT,BSRT;Kelly [place], [place], ACSM CEP, Exercise Physiologist    Virtual Visit No    Medication changes reported     No    Fall or balance concerns reported    No    Warm-up and Cool-down Performed on first and last piece of equipment    Resistance Training Performed Yes    VAD Patient? No    PAD/SET Patient? No      Pain Assessment   Currently in Pain? No/denies                Social History   Tobacco Use  Smoking Status Former   Packs/day: 0.50   Years: 25.00   Additional pack years: 0.00   Total pack years: 12.50   Types: Cigarettes   Quit date: 04/13/1997   Years since quitting: 25.8  Smokeless Tobacco Never    Goals Met:  Independence with exercise equipment Exercise tolerated well No report of concerns or symptoms today Strength training completed today  Goals Unmet:  Not Applicable  Comments: Pt able to follow exercise prescription today without complaint.  Will continue to monitor for progression.    Dr. Bethann Punches is Medical Director for [facility] Cardiac Rehabilitation.  Dr. Vida Rigger is Medical Director for [facility] Pulmonary Rehabilitation.

## 2023-02-28 DIAGNOSIS — H401132 Primary open-angle glaucoma, bilateral, moderate stage: Secondary | ICD-10-CM | POA: Diagnosis not present

## 2023-02-28 DIAGNOSIS — H35371 Puckering of macula, right eye: Secondary | ICD-10-CM | POA: Diagnosis not present

## 2023-02-28 DIAGNOSIS — H43813 Vitreous degeneration, bilateral: Secondary | ICD-10-CM | POA: Diagnosis not present

## 2023-02-28 NOTE — Telephone Encounter (Signed)
Spoke with pt and she stated that she was not aware that she was not supposed to be taking this medication but she has not been because the refills have been denied.

## 2023-03-01 ENCOUNTER — Telehealth (HOSPITAL_COMMUNITY): Payer: Self-pay

## 2023-03-01 NOTE — Telephone Encounter (Addendum)
Pt aware, agreeable, and verbalized understanding   ----- Message from Jacklynn Ganong, FNP sent at 03/01/2023  4:07 PM EDT ----- Good new! EF improving, now up to 30-35%, RV normal, mitral valve prosthesis functioning normally

## 2023-03-04 ENCOUNTER — Encounter: Payer: Medicare Other | Admitting: *Deleted

## 2023-03-04 DIAGNOSIS — Z952 Presence of prosthetic heart valve: Secondary | ICD-10-CM

## 2023-03-04 DIAGNOSIS — I5022 Chronic systolic (congestive) heart failure: Secondary | ICD-10-CM | POA: Diagnosis not present

## 2023-03-04 NOTE — Progress Notes (Signed)
Daily Session Note  Patient Details  Name: Melanie Ray MRN: [id number] Date of Birth: Jul 18, 1951 Referring Provider:   Flowsheet Row Cardiac Rehab from 01/28/2023 in [facility] Cardiac and Pulmonary Rehab  Referring Provider Excell Seltzer       Encounter Date: 03/04/2023  Check In:  Session Check In - 03/04/23 1546       Check-In   Supervising physician immediately available to respond to emergencies See telemetry face sheet for immediately available ER MD    Location [facility]-Cardiac & Pulmonary Rehab    Staff Present Susann Givens, RN BSN;Joseph Reino Kent, RCP,RRT,BSRT;Noah Tickle, [place], Exercise Physiologist    Virtual Visit No    Medication changes reported     Yes    Comments added namenda    Fall or balance concerns reported    No    Warm-up and Cool-down Performed on first and last piece of equipment    Resistance Training Performed Yes    VAD Patient? No    PAD/SET Patient? No      Pain Assessment   Currently in Pain? No/denies                Social History   Tobacco Use  Smoking Status Former   Packs/day: 0.50   Years: 25.00   Additional pack years: 0.00   Total pack years: 12.50   Types: Cigarettes   Quit date: 04/13/1997   Years since quitting: 25.9  Smokeless Tobacco Never    Goals Met:  Independence with exercise equipment Exercise tolerated well No report of concerns or symptoms today Strength training completed today  Goals Unmet:  Not Applicable  Comments: Pt able to follow exercise prescription today without complaint.  Will continue to monitor for progression.    Dr. Bethann Punches is Medical Director for [facility] Cardiac Rehabilitation.  Dr. Vida Rigger is Medical Director for [facility] Pulmonary Rehabilitation.

## 2023-03-06 ENCOUNTER — Encounter: Payer: Medicare Other | Attending: Cardiovascular Disease | Admitting: *Deleted

## 2023-03-06 DIAGNOSIS — Z952 Presence of prosthetic heart valve: Secondary | ICD-10-CM | POA: Diagnosis not present

## 2023-03-06 DIAGNOSIS — I5022 Chronic systolic (congestive) heart failure: Secondary | ICD-10-CM | POA: Diagnosis not present

## 2023-03-06 NOTE — Progress Notes (Signed)
Daily Session Note  Patient Details  Name: Melanie Ray MRN: [id number] Date of Birth: 10-04-1951 Referring Provider:   Flowsheet Row Cardiac Rehab from 01/28/2023 in [facility] Cardiac and Pulmonary Rehab  Referring Provider Excell Seltzer       Encounter Date: 03/06/2023  Check In:  Session Check In - 03/06/23 1557       Check-In   Supervising physician immediately available to respond to emergencies See telemetry face sheet for immediately available ER MD    Location [facility]-Cardiac & Pulmonary Rehab    Staff Present Susann Givens, RN Mabeline Caras, BS, ACSM CEP, Exercise Physiologist;Laureen Manson Passey, BS, RRT, CPFT    Virtual Visit No    Medication changes reported     No    Fall or balance concerns reported    No    Warm-up and Cool-down Performed on first and last piece of equipment    Resistance Training Performed Yes    VAD Patient? No    PAD/SET Patient? No      Pain Assessment   Currently in Pain? No/denies                Social History   Tobacco Use  Smoking Status Former   Packs/day: 0.50   Years: 25.00   Additional pack years: 0.00   Total pack years: 12.50   Types: Cigarettes   Quit date: 04/13/1997   Years since quitting: 25.9  Smokeless Tobacco Never    Goals Met:  Independence with exercise equipment Exercise tolerated well No report of concerns or symptoms today Strength training completed today  Goals Unmet:  Not Applicable  Comments: Pt able to follow exercise prescription today without complaint.  Will continue to monitor for progression.    Dr. Bethann Punches is Medical Director for [facility] Cardiac Rehabilitation.  Dr. Vida Rigger is Medical Director for [facility] Pulmonary Rehabilitation.

## 2023-03-13 ENCOUNTER — Ambulatory Visit: Payer: Medicare Other | Admitting: Cardiology

## 2023-03-14 ENCOUNTER — Telehealth: Payer: Self-pay

## 2023-03-14 NOTE — Telephone Encounter (Signed)
Called patient to check in on cardiac rehab- she has been out of town and places to return 5/13.

## 2023-03-15 ENCOUNTER — Encounter: Payer: Medicare Other | Admitting: Psychology

## 2023-03-18 ENCOUNTER — Encounter: Payer: Medicare Other | Admitting: *Deleted

## 2023-03-18 DIAGNOSIS — Z952 Presence of prosthetic heart valve: Secondary | ICD-10-CM

## 2023-03-18 DIAGNOSIS — I5022 Chronic systolic (congestive) heart failure: Secondary | ICD-10-CM | POA: Diagnosis not present

## 2023-03-18 NOTE — Progress Notes (Signed)
Daily Session Note  Patient Details  Name: Kalkidan Marotti MRN: [id number] Date of Birth: October 12, 1951 Referring Provider:   Flowsheet Row Cardiac Rehab from 01/28/2023 in [facility] Cardiac and Pulmonary Rehab  Referring Provider Excell Seltzer       Encounter Date: 03/18/2023  Check In:  Session Check In - 03/18/23 1549       Check-In   Supervising physician immediately available to respond to emergencies See telemetry face sheet for immediately available ER MD    Location [facility]-Cardiac & Pulmonary Rehab    Staff Present Susann Givens, RN BSN;Joseph Reino Kent, RCP,RRT,BSRT;Noah [place], [place], Exercise Physiologist    Virtual Visit No    Medication changes reported     No    Fall or balance concerns reported    No    Warm-up and Cool-down Performed on first and last piece of equipment    Resistance Training Performed Yes    VAD Patient? No    PAD/SET Patient? No      Pain Assessment   Currently in Pain? No/denies                Social History   Tobacco Use  Smoking Status Former   Packs/day: 0.50   Years: 25.00   Additional pack years: 0.00   Total pack years: 12.50   Types: Cigarettes   Quit date: 04/13/1997   Years since quitting: 25.9  Smokeless Tobacco Never    Goals Met:  Independence with exercise equipment Exercise tolerated well No report of concerns or symptoms today Strength training completed today  Goals Unmet:  Not Applicable  Comments: Pt able to follow exercise prescription today without complaint.  Will continue to monitor for progression.    Dr. Bethann Punches is Medical Director for [facility] Cardiac Rehabilitation.  Dr. Vida Rigger is Medical Director for [facility] Pulmonary Rehabilitation.

## 2023-03-20 ENCOUNTER — Encounter: Payer: Self-pay | Admitting: *Deleted

## 2023-03-20 ENCOUNTER — Encounter: Payer: Self-pay | Admitting: Psychology

## 2023-03-20 ENCOUNTER — Encounter: Payer: Self-pay | Admitting: Family Medicine

## 2023-03-20 ENCOUNTER — Telehealth: Payer: Self-pay

## 2023-03-20 ENCOUNTER — Ambulatory Visit (INDEPENDENT_AMBULATORY_CARE_PROVIDER_SITE_OTHER): Payer: Medicare Other | Admitting: Family Medicine

## 2023-03-20 VITALS — BP 130/82 | HR 81 | Temp 98.5°F | Ht 63.0 in | Wt 134.6 lb

## 2023-03-20 DIAGNOSIS — E119 Type 2 diabetes mellitus without complications: Secondary | ICD-10-CM

## 2023-03-20 DIAGNOSIS — F331 Major depressive disorder, recurrent, moderate: Secondary | ICD-10-CM | POA: Diagnosis not present

## 2023-03-20 DIAGNOSIS — I5022 Chronic systolic (congestive) heart failure: Secondary | ICD-10-CM

## 2023-03-20 DIAGNOSIS — E782 Mixed hyperlipidemia: Secondary | ICD-10-CM

## 2023-03-20 DIAGNOSIS — Z952 Presence of prosthetic heart valve: Secondary | ICD-10-CM

## 2023-03-20 DIAGNOSIS — G4733 Obstructive sleep apnea (adult) (pediatric): Secondary | ICD-10-CM | POA: Insufficient documentation

## 2023-03-20 DIAGNOSIS — F419 Anxiety disorder, unspecified: Secondary | ICD-10-CM | POA: Diagnosis not present

## 2023-03-20 LAB — HEMOGLOBIN A1C: Hgb A1c MFr Bld: 5.9 % (ref 4.6–6.5)

## 2023-03-20 LAB — LIPID PANEL
Cholesterol: 129 mg/dL (ref 0–200)
HDL: 44.9 mg/dL (ref >39.00)
LDL Cholesterol: 75 mg/dL (ref 0–99)
NonHDL: 84.45
Total CHOL/HDL Ratio: 3
Triglycerides: 46 mg/dL (ref 0.0–149.0)
VLDL: 9.2 mg/dL (ref 0.0–40.0)

## 2023-03-20 LAB — COMPREHENSIVE METABOLIC PANEL
ALT: 19 U/L (ref 0–35)
AST: 24 U/L (ref 0–37)
Albumin: 3.7 g/dL (ref 3.5–5.2)
Alkaline Phosphatase: 94 U/L (ref 39–117)
BUN: 15 mg/dL (ref 6–23)
CO2: 26 mEq/L (ref 19–32)
Calcium: 9.2 mg/dL (ref 8.4–10.5)
Chloride: 108 mEq/L (ref 96–112)
Creatinine, Ser: 0.84 mg/dL (ref 0.40–1.20)
GFR: 69.2 mL/min (ref >60.00)
Glucose, Bld: 57 mg/dL — ABNORMAL LOW (ref 70–99)
Potassium: 3.8 mEq/L (ref 3.5–5.1)
Sodium: 140 mEq/L (ref 135–145)
Total Bilirubin: 0.6 mg/dL (ref 0.2–1.2)
Total Protein: 6.5 g/dL (ref 6.0–8.3)

## 2023-03-20 NOTE — Assessment & Plan Note (Signed)
Chronic issue.  Undetermined control.  Check A1c today.

## 2023-03-20 NOTE — Progress Notes (Signed)
30 Day review completed. Medical Director ITP review done, changes made as directed, and signed approval by Medical Director. ? ?

## 2023-03-20 NOTE — Assessment & Plan Note (Signed)
Check lipid panel.  Continue Lipitor 80 mg daily. 

## 2023-03-20 NOTE — Progress Notes (Signed)
Cardiac Individual Treatment Plan  Patient Details  Name: Melanie Ray MRN: [id number] Date of Birth: 15-Oct-1951 Referring Provider:   Flowsheet Row Cardiac Rehab from 01/28/2023 in [facility] Cardiac and Pulmonary Rehab  Referring Provider Excell Seltzer       Initial Encounter Date:  Flowsheet Row Cardiac Rehab from 01/28/2023 in [facility] Cardiac and Pulmonary Rehab  Date 01/28/23       Visit Diagnosis: S/P mitral valve replacement  Patient's Home Medications on Admission:  Current Outpatient Medications:    aspirin EC 81 MG tablet, Take 1 tablet (81 mg total) by mouth daily. Swallow whole., Disp: 30 tablet, Rfl: 12   atorvastatin (LIPITOR) 80 MG tablet, Take 1 tablet by mouth once daily, Disp: 90 tablet, Rfl: 0   carvedilol (COREG) 12.5 MG tablet, Take 0.5 tablets (6.25 mg total) by mouth 2 (two) times daily with a meal., Disp: 60 tablet, Rfl: 1   dorzolamide-timolol (COSOPT) 22.3-6.8 MG/ML ophthalmic solution, Place 1 drop into both eyes 2 (two) times daily. , Disp: , Rfl:    furosemide (LASIX) 40 MG tablet, Take 40 mg by mouth as needed., Disp: , Rfl:    gabapentin (NEURONTIN) 100 MG capsule, Take 100 mg by mouth 2 (two) times daily., Disp: , Rfl:    latanoprost (XALATAN) 0.005 % ophthalmic solution, Place 1 drop into both eyes at bedtime., Disp: , Rfl:    spironolactone (ALDACTONE) 25 MG tablet, Take 0.5 tablets (12.5 mg total) by mouth daily., Disp: 45 tablet, Rfl: 3   traZODone (DESYREL) 150 MG tablet, Take 150 mg by mouth at bedtime., Disp: , Rfl:   Past Medical History: Past Medical History:  Diagnosis Date   AICD (automatic cardioverter/defibrillator) present    Anemia    Anxiety    Back pain    CAD (coronary artery disease) 04/11/2016   S/p ant STEMI 5/17: LHC >> LAD proximal 80%, mid 80%, distal 50%, ostial D1 60%; LCx with LPDA lesion 30%; RCA Mild calcification with no significant stenosis in a medium caliber, nondominant RCA; LVEF is estimated at 45% with inferoapical  and lateral wall akinesis >> PCI: PCI: 3.5 x 24 mm Promus DES to prox LAD, 2.5 x 12 mm Promus DES to mid LAD. // Myoview 7/21: EF 20, no ischemia; high risk    Chest pain    Chronic systolic CHF (congestive heart failure) (HCC) 03/21/2016   Echo 01/30/17: Diff HK, mild focal basal septal hypertrophy, EF 30-35, mild AI, MAC, mild MR // Echo 06/08/16: Mild focal basal septal hypertrophy, EF 25-30%, diff HK, ant-septal AK, Gr 1 DD, mild AI, MAC, mild MR, PASP 37 mmHg // Echo 03/18/16: EF 30-35%, ant-septal AK, Gr 1 DD, mild MR, severe LAE.   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 Excell Seltzer and  Dr. Lynnette Caffey   Sleep apnea    SOB (shortness of breath)    Stroke [facility])    2021   Suicidal ideation 08/27/2018   Swelling    feet or legs    Tobacco Use: Social History   Tobacco Use  Smoking Status Former   Packs/day: 0.50   Years: 25.00   Additional pack years: 0.00   Total pack years: 12.50   Types: Cigarettes   Quit date: 04/13/1997   Years since quitting: 25.9  Smokeless Tobacco Never    Labs: Review Flowsheet  More data exists      Latest Ref Rng & Units 08/02/2022 10/15/2022 10/24/2022 10/25/2022 10/26/2022  Labs for ITP Cardiac and Pulmonary Rehab  PH, Arterial 7.35 - 7.45 7.359  7.418  - 7.316  7.349  7.348  7.380  7.437  7.379  7.263  - -  PCO2 arterial 32 - 48 mmHg 43.5  29.4  - 40.6  38.3  35.2  35.7  32.0  34.0  42.2  - -  Bicarbonate 20.0  - 28.0 mmol/L 23.2  24.5  19.0  25.4  25.8  20.7  21.1  19.3  21.4  21.6  27.8  20.0  19.1  - -  TCO2 22 - 32 mmol/L 24  26  20  27  27  22  22  20  23  23  24   49  29  21  20  20  23   - -  Acid-base deficit 0.0 - 2.0 mmol/L 2.0  1.0  5.0  - 5.0  4.0  6.0  4.0  2.0  5.0  7.0  - -  O2 Saturation % 53  53  98  64  64  98  79  98  98  93  100  81  100  100  70.1  78.2      Exercise Target Goals: Exercise Program Goal: Individual exercise prescription set using results from initial 6 min walk test and THRR while considering  patient's activity barriers and safety.   Exercise Prescription Goal: Initial exercise prescription builds to 30-45 minutes a day of aerobic activity, 2-3 days per week.  Home exercise guidelines will be given to patient during program as part of exercise prescription that the participant will acknowledge.   Education: Aerobic Exercise: - Group verbal and visual presentation on the components of exercise prescription. Introduces F.I.T.T principle from ACSM for exercise prescriptions.  Reviews F.I.T.T. principles of aerobic exercise including progression. Written material given at graduation.   Education: Resistance Exercise: - Group verbal and visual presentation on the components of exercise prescription. Introduces F.I.T.T principle from ACSM for exercise prescriptions  Reviews F.I.T.T. principles of resistance exercise including progression. Written material given at graduation.    Education: Exercise & Equipment Safety: - Individual verbal instruction and demonstration of equipment use and safety with use of the equipment. Flowsheet Row Cardiac Rehab from 01/28/2023 in [facility] Cardiac and Pulmonary Rehab  Date 01/28/23  Educator [facility]  Instruction Review Code 1- Verbalizes Understanding       Education: Exercise Physiology & General Exercise Guidelines: - Group verbal and written instruction with models to review the exercise physiology of the cardiovascular system and  associated critical values. Provides general exercise guidelines with specific guidelines to those with heart or lung disease.    Education: Flexibility, Balance, Mind/Body Relaxation: - Group verbal and visual presentation with interactive activity on the components of exercise prescription. Introduces F.I.T.T principle from ACSM for  exercise prescriptions. Reviews F.I.T.T. principles of flexibility and balance exercise training including progression. Also discusses the mind body connection.  Reviews various relaxation techniques to help reduce and manage stress (i.e. Deep breathing, progressive muscle relaxation, and visualization). Balance handout provided to take home. Written material given at graduation.   Activity Barriers & Risk Stratification:  Activity Barriers & Cardiac Risk Stratification - 01/28/23 1704       Activity Barriers & Cardiac Risk Stratification   Activity Barriers None    Cardiac Risk Stratification High             6 Minute Walk:  6 Minute Walk     Row Name 01/28/23 1649         6 Minute Walk   Phase Initial     Distance 820 feet     Walk Time 6 minutes     # of Rest Breaks 0     MPH 1.55     METS 1.87     RPE 11     Perceived Dyspnea  1     VO2 Peak 6.54     Symptoms No     Resting HR 85 bpm     Resting BP 124/72     Resting Oxygen Saturation  100 %     Exercise Oxygen Saturation  during 6 min walk 100 %     Max Ex. HR 91 bpm     Max Ex. BP 112/60     2 Minute Post BP 104/60              Oxygen Initial Assessment:   Oxygen Re-Evaluation:   Oxygen Discharge (Final Oxygen Re-Evaluation):   Initial Exercise Prescription:  Initial Exercise Prescription - 01/28/23 1700       Date of Initial Exercise RX and Referring Provider   Date 01/28/23    Referring Provider Excell Seltzer      Oxygen   Maintain Oxygen Saturation 88% or higher      Treadmill   MPH 1.2    Grade 0    Minutes 15    METs 1.92      Recumbant Bike   Level 1     RPM 50    Watts 15    Minutes 15      NuStep   Level 1    SPM 80    Minutes 15    METs 1.87      T5 Nustep   Level 1    SPM 80    Minutes 15    METs 1.87      Biostep-RELP   Level 1    SPM 50    Minutes 15    METs 1.87      Prescription Details   Frequency (times per week) 2    Duration Progress to 30 minutes of continuous aerobic without signs/symptoms of physical distress      Intensity   THRR 40-80% of Max Heartrate 110-135    Ratings of Perceived Exertion 11-13    Perceived Dyspnea 0-4      Progression   Progression Continue to progress workloads to maintain intensity without signs/symptoms of physical distress.      Resistance Training   Training Prescription Yes    Weight 2    Reps 10-15             Perform Capillary Blood Glucose checks as needed.  Exercise Prescription Changes:   Exercise Prescription Changes     Row Name  01/28/23 1700 02/11/23 1400 02/25/23 1500 03/12/23 0800 03/18/23 1600     Response to Exercise   Blood Pressure (Admit) 124/72 132/62 128/68 130/60 --   Blood Pressure (Exercise) 112/60 136/60 142/62 138/60 --   Blood Pressure (Exit) 104/60 130/64 148/60 124/82 --   Heart Rate (Admit) 85 bpm 71 bpm 71 bpm 68 bpm --   Heart Rate (Exercise) 91 bpm 94 bpm 94 bpm 102 bpm --   Heart Rate (Exit) 86 bpm 82 bpm 63 bpm 79 bpm --   Oxygen Saturation (Admit) 100 % -- -- -- --   Oxygen Saturation (Exercise) 100 % -- -- -- --   Oxygen Saturation (Exit) 100 % -- -- -- --   Rating of Perceived Exertion (Exercise) 11 13 13 13  --   Perceived Dyspnea (Exercise) 1 -- -- -- --   Symptoms none none none none --   Comments 6 MWT results 2nd full day of exercise -- -- --   Duration -- Progress to 30 minutes of  aerobic without signs/symptoms of physical distress Continue with 30 min of aerobic exercise without signs/symptoms of physical distress. Continue with 30 min of aerobic exercise without signs/symptoms of physical distress. --    Intensity -- THRR unchanged THRR unchanged THRR unchanged --     Progression   Progression -- Continue to progress workloads to maintain intensity without signs/symptoms of physical distress. Continue to progress workloads to maintain intensity without signs/symptoms of physical distress. Continue to progress workloads to maintain intensity without signs/symptoms of physical distress. --   Average METs -- 1.72 1.95 2.11 --     Resistance Training   Training Prescription -- Yes Yes Yes --   Weight -- 2 lb 2 lb 2 lb --   Reps -- 10-15 10-15 10-15 --     Interval Training   Interval Training -- No No No --     Treadmill   MPH -- -- 0.7 0.7 --   Grade -- -- 0 0 --   Minutes -- -- 15 15 --   METs -- -- 1.54 1.5 --     NuStep   Level -- 1 2 2  --   Minutes -- 15 15 15  --   METs -- 1.9 2.5 2.6 --     REL-XR   Level -- -- 1 -- --   Minutes -- -- 15 -- --   METs -- -- 1.6 -- --     T5 Nustep   Level -- 1 -- 1 --   Minutes -- 15 -- 15 --   METs -- 1.8 -- 1.7 --     Track   Laps -- 14 22 30  --   Minutes -- 15 15 15  --   METs -- 1.76 2.2 2.63 --     Home Exercise Plan   Plans to continue exercise at -- -- -- -- Home (comment)  walking, possibly looking into [employer]   Frequency -- -- -- -- Add 2 additional days to program exercise sessions.   Initial Home Exercises Provided -- -- -- -- 03/18/23     Oxygen   Maintain Oxygen Saturation -- 88% or higher 88% or higher 88% or higher 88% or higher            Exercise Comments:   Exercise Comments     Row Name 02/05/23 1007           Exercise Comments First full day of exercise!  Patient was oriented to gym  and equipment including functions, settings, policies, and procedures.  Patient's individual exercise prescription and treatment plan were reviewed.  All starting workloads were established based on the results of the 6 minute walk test done at initial orientation visit.  The plan for exercise progression was also  introduced and progression will be customized based on patient's performance and goals.                Exercise Goals and Review:   Exercise Goals     Row Name 01/28/23 1715             Exercise Goals   Increase Physical Activity Yes       Intervention Provide advice, education, support and counseling about physical activity/exercise needs.;Develop an individualized exercise prescription for aerobic and resistive training based on initial evaluation findings, risk stratification, comorbidities and participant's personal goals.       Expected Outcomes Short Term: Attend rehab on a regular basis to increase amount of physical activity.;Long Term: Add in home exercise to make exercise part of routine and to increase amount of physical activity.;Long Term: Exercising regularly at least 3-5 days a week.       Increase Strength and Stamina Yes       Intervention Provide advice, education, support and counseling about physical activity/exercise needs.;Develop an individualized exercise prescription for aerobic and resistive training based on initial evaluation findings, risk stratification, comorbidities and participant's personal goals.       Expected Outcomes Short Term: Increase workloads from initial exercise prescription for resistance, speed, and METs.;Short Term: Perform resistance training exercises routinely during rehab and add in resistance training at home;Long Term: Improve cardiorespiratory fitness, muscular endurance and strength as measured by increased METs and functional capacity ( )       Able to understand and use rate of perceived exertion (RPE) scale Yes       Intervention Provide education and explanation on how to use RPE scale       Expected Outcomes Short Term: Able to use RPE daily in rehab to express subjective intensity level;Long Term:  Able to use RPE to guide intensity level when exercising independently       Able to understand and use Dyspnea scale Yes        Intervention Provide education and explanation on how to use Dyspnea scale       Expected Outcomes Short Term: Able to use Dyspnea scale daily in rehab to express subjective sense of shortness of breath during exertion;Long Term: Able to use Dyspnea scale to guide intensity level when exercising independently       Knowledge and understanding of Target Heart Rate Range (THRR) Yes       Intervention Provide education and explanation of THRR including how the numbers were predicted and where they are located for reference       Expected Outcomes Short Term: Able to state/look up THRR;Long Term: Able to use THRR to govern intensity when exercising independently;Short Term: Able to use daily as guideline for intensity in rehab       Able to check pulse independently Yes       Intervention Provide education and demonstration on how to check pulse in carotid and radial arteries.;Review the importance of being able to check your own pulse for safety during independent exercise       Expected Outcomes Short Term: Able to explain why pulse checking is important during independent exercise;Long Term: Able to check pulse independently and accurately  Understanding of Exercise Prescription Yes       Intervention Provide education, explanation, and written materials on patient's individual exercise prescription       Expected Outcomes Short Term: Able to explain program exercise prescription;Long Term: Able to explain home exercise prescription to exercise independently                Exercise Goals Re-Evaluation :  Exercise Goals Re-Evaluation     Row Name 02/05/23 1007 02/11/23 1436 02/25/23 1508 03/12/23 0831 03/18/23 1624     Exercise Goal Re-Evaluation   Exercise Goals Review Increase Physical Activity;Able to understand and use rate of perceived exertion (RPE) scale;Knowledge and understanding of Target Heart Rate Range (THRR);Understanding of Exercise Prescription;Increase Strength and  Stamina;Able to check pulse independently Increase Physical Activity;Increase Strength and Stamina;Understanding of Exercise Prescription Increase Physical Activity;Increase Strength and Stamina;Understanding of Exercise Prescription Increase Physical Activity;Increase Strength and Stamina;Understanding of Exercise Prescription Increase Physical Activity;Increase Strength and Stamina;Understanding of Exercise Prescription   Comments Reviewed RPE scale, THR and program prescription with pt today.  Pt voiced understanding and was given a copy of goals to take home. Patient is doing well for the first couple of times she has been at rehab. She was able to get 14 laps on the track and exercise the rest at her initial exercise prescription. She had appropriate RPEs throughout the sessions. We will continue to monitor as she progresses in the program. Emony is doing well in rehab. She walked up to 22 laps on the track, and also started using the treadmill and did well at a speed of 0.7 mph with no incline. She also was able to improve to level 2 on the T4 nustep and began using the XR at level 1. We will continue to monitor her progress in the program. Scotti continues to do well in rehab. She walked up to 30 laps on the track this time around!  She has been consistent at level 1 on the T5 Nustep and could benefit from increasing to level 2. She has not been quite hitting her THR at this time. She has been walking at a 0.7 mph on the treadmill and seems to benefit more from the track as her speed is increased. Will continue to monitor. Reviewed home exercise with pt today.  Pt plans to walk for exercise. Patient has a treadmill at home. She may also walk outdoors or indoors if the weather is not appropriate. She was encouraged to practice walking on the TM here at rehab to ensure she has a good a balanced feel for it to make sure she is safe with it.  We also discussed the [facility] and she was willing to explore it as  an option. She also was encouraged to use household items (like canned food) to help with resistance. Reviewed THR, pulse, RPE, sign and symptoms, pulse oximetery and when to call 911 or MD.  Also discussed weather considerations and indoor options.  Pt voiced understanding.   Expected Outcomes Short: Use RPE daily to regulate intensity.  Long: Follow program prescription in THR. Short: Continue to work at initial exercise prescription Long: Increase overall MET level and stamina Short: Continue to push for more laps on track, and progressively increase treadmill workload. Long: Continue to improve strength and stamina. Short: Slowly increase speed on treadmill, increase laps on track Long: Continue to increase overall MET level and stamina Short: Add on 1 day of exercise at home, check out the [place] at her  own convenience Long: Continue to exercise independently at home            Discharge Exercise Prescription (Final Exercise Prescription Changes):  Exercise Prescription Changes - 03/18/23 1600       Home Exercise Plan   Plans to continue exercise at Home (comment)   walking, possibly looking into [employer]   Frequency Add 2 additional days to program exercise sessions.    Initial Home Exercises Provided 03/18/23      Oxygen   Maintain Oxygen Saturation 88% or higher             Nutrition:  Target Goals: Understanding of nutrition guidelines, daily intake of sodium 1500mg , cholesterol 200mg , calories 30% from fat and 7% or less from saturated fats, daily to have 5 or more servings of fruits and vegetables.  Education: All About Nutrition: -Group instruction provided by verbal, written material, interactive activities, discussions, models, and posters to present general guidelines for heart healthy nutrition including fat, fiber, MyPlate, the role of sodium in heart healthy nutrition, utilization of the nutrition label, and utilization of this knowledge for meal planning. Follow  up email sent as well. Written material given at graduation.   Biometrics:  Pre Biometrics - 01/28/23 1716       Pre Biometrics   Height 5\' 3"  (1.6 m)    Weight 132 lb 1.6 oz (59.9 kg)    Waist Circumference 33 inches    Hip Circumference 36.5 inches    Waist to Hip Ratio 0.9 %    BMI (Calculated) 23.41    Single Leg Stand 7.09 seconds              Nutrition Therapy Plan and Nutrition Goals:  Nutrition Therapy & Goals - 01/28/23 1554       Nutrition Therapy   Diet Heart healthy, low Na, T2DM MNT    Drug/Food Interactions Statins/Certain Fruits;Coumadin/Vit K    Protein (specify units) 75g    Fiber 25 grams    Whole Grain Foods 3 servings    Saturated Fats 12 max. grams    Fruits and Vegetables 8 servings/day    Sodium 2 grams      Personal Nutrition Goals   Nutrition Goal ST: compare 3-4 days of eating with list of foods high in vitamin K to see how much you are eating, read/compare food labels, review paperwork LT: manage INR, maintain A1C <7, limit sodium <2g/day, follow MyPlate guidelines    Comments 72 y.o. F admitted to cardiac rehab for chronic systolic CHF. PMHx includes CAD, HLD, anemia, stroke, severe mitral insufficiency, severe mitral regurgitation, T2DM, osteopenia, anxiety/depression. Medications reviewed atorvastatin, jardiance, furosemide, potassium-chloride, trazodone, warfarin. B: [place] sausage and eggs D: chicken or fish or beef with a vegetables. She reports using butter, she will use seasoning salt with her meat. She may also have cold cuts. S:she reports having a sweet tooth - she will have a cookie or 1/2 cup of ice cream and she may have reeses PB cup. Drinks: water and diet coke (not even a whole bottle per day and sweet tea (1/4 cup usually). Discussed heart healthy eating and diabetes friendly eating. [place] would like to work on limiting sodium - reviewed label reading; she feels confident in label reading. Discussed warfarin and vitamin K  consistency.      Intervention Plan   Intervention Prescribe, educate and counsel regarding individualized specific dietary modifications aiming towards targeted core components such as weight, hypertension, lipid management, diabetes, heart failure and  other comorbidities.;Nutrition handout(s) given to patient.    Expected Outcomes Short Term Goal: Understand basic principles of dietary content, such as calories, fat, sodium, cholesterol and nutrients.;Short Term Goal: A plan has been developed with personal nutrition goals set during dietitian appointment.;Long Term Goal: Adherence to prescribed nutrition plan.             Nutrition Assessments:  MEDIFICTS Score Key: ?70 Need to make dietary changes  40-70 Heart Healthy Diet ? 40 Therapeutic Level Cholesterol Diet  Flowsheet Row Cardiac Rehab from 02/05/2023 in [facility] Cardiac and Pulmonary Rehab  Picture Your Plate Total Score on Admission 51      Picture Your Plate Scores: <81 Unhealthy dietary pattern with much room for improvement. 41-50 Dietary pattern unlikely to meet recommendations for good health and room for improvement. 51-60 More healthful dietary pattern, with some room for improvement.  >60 Healthy dietary pattern, although there may be some specific behaviors that could be improved.    Nutrition Goals Re-Evaluation:  Nutrition Goals Re-Evaluation     Row Name 03/18/23 1617             Goals   Nutrition Goal ST: compare 3-4 days of eating with list of foods high in vitamin K to see how much you are eating, read/compare food labels, review paperwork LT: manage INR, maintain A1C <7, limit sodium <2g/day, follow MyPlate guidelines       Comment Lawrence states she has not really read or paid attention to her Vitamin K intake. Encouraged to start keeping track of the foods she eats (per RD goals take 3-4 days worth of eating) and advised her to discuss with her MD as our RD is not in her role anymore to review how much  she should be having. Staff to review education with patient as well  so patient has a better understanding on importance. Provide a handout. She is reading labels for her sodium intake and staying mindful on what she is eating and how much she is eating       Expected Outcome Short: Understand Vitamin K education involved with her diet, staff to review more information. Patient to start keeping log of food for next 3-4 days per RD goal Long: Continue to follow guidelines by RD and eat a heart healthy diet                Nutrition Goals Discharge (Final Nutrition Goals Re-Evaluation):  Nutrition Goals Re-Evaluation - 03/18/23 1617       Goals   Nutrition Goal ST: compare 3-4 days of eating with list of foods high in vitamin K to see how much you are eating, read/compare food labels, review paperwork LT: manage INR, maintain A1C <7, limit sodium <2g/day, follow MyPlate guidelines    Comment Clorissa states she has not really read or paid attention to her Vitamin K intake. Encouraged to start keeping track of the foods she eats (per RD goals take 3-4 days worth of eating) and advised her to discuss with her MD as our RD is not in her role anymore to review how much she should be having. Staff to review education with patient as well  so patient has a better understanding on importance. Provide a handout. She is reading labels for her sodium intake and staying mindful on what she is eating and how much she is eating    Expected Outcome Short: Understand Vitamin K education involved with her diet, staff to review more information. Patient to  start keeping log of food for next 3-4 days per RD goal Long: Continue to follow guidelines by RD and eat a heart healthy diet             Psychosocial: Target Goals: Acknowledge presence or absence of significant depression and/or stress, maximize coping skills, provide positive support system. Participant is able to verbalize types and ability to use  techniques and skills needed for reducing stress and depression.   Education: Stress, Anxiety, and Depression - Group verbal and visual presentation to define topics covered.  Reviews how body is impacted by stress, anxiety, and depression.  Also discusses healthy ways to reduce stress and to treat/manage anxiety and depression.  Written material given at graduation.   Education: Sleep Hygiene -Provides group verbal and written instruction about how sleep can affect your health.  Define sleep hygiene, discuss sleep cycles and impact of sleep habits. Review good sleep hygiene tips.    Initial Review & Psychosocial Screening:  Initial Psych Review & Screening - 12/26/22 1345       Initial Review   Current issues with History of Depression;Current Depression;Current Anxiety/Panic      Family Dynamics   Good Support System? Yes    Comments Her depression and anxiety does not stem from any one thing. Adreana can look to her sister, brother and husband. She is ready to get her health on track and start rehab.      Barriers   Psychosocial barriers to participate in program The patient should benefit from training in stress management and relaxation.      Screening Interventions   Interventions Encouraged to exercise;Provide feedback about the scores to participant;To provide support and resources with identified psychosocial needs    Expected Outcomes Short Term goal: Utilizing psychosocial counselor, staff and physician to assist with identification of specific Stressors or current issues interfering with healing process. Setting desired goal for each stressor or current issue identified.;Long Term Goal: Stressors or current issues are controlled or eliminated.;Short Term goal: Identification and review with participant of any Quality of Life or Depression concerns found by scoring the questionnaire.;Long Term goal: The participant improves quality of Life and PHQ9 Scores as seen by post scores  and/or verbalization of changes             Quality of Life Scores:   Quality of Life - 02/05/23 1016       Quality of Life   Select Quality of Life      Quality of Life Scores   Health/Function Pre 16.5 %    Socioeconomic Pre 29.14 %    Psych/Spiritual Pre 16 %    Family Pre 26 %    GLOBAL Pre 20.57 %            Scores of 19 and below usually indicate a poorer quality of life in these areas.  A difference of  2-3 points is a clinically meaningful difference.  A difference of 2-3 points in the total score of the Quality of Life Index has been associated with significant improvement in overall quality of life, self-image, physical symptoms, and general health in studies assessing change in quality of life.  PHQ-9: Review Flowsheet  More data exists      03/20/2023 01/28/2023 01/02/2023 12/28/2022 09/25/2022  Depression screen PHQ 2/9  Decreased Interest 1 1 3 3 3   Down, Depressed, Hopeless 1 1 3 3 3   PHQ - 2 Score 2 2 6 6 6   Altered sleeping 1 1  3 3 -  Tired, decreased energy 2 1 3 3  -  Change in appetite 0 0 3 0 -  Feeling bad or failure about yourself  1 0 2 3 -  Trouble concentrating 0 0 2 0 -  Moving slowly or fidgety/restless 1 0 2 1 -  Suicidal thoughts 1 0 3 2 -  PHQ-9 Score 8 4 24 18  -  Difficult doing work/chores Somewhat difficult Not difficult at all Somewhat difficult Somewhat difficult -   Interpretation of Total Score  Total Score Depression Severity:  1-4 = Minimal depression, 5-9 = Mild depression, 10-14 = Moderate depression, 15-19 = Moderately severe depression, 20-27 = Severe depression   Psychosocial Evaluation and Intervention:  Psychosocial Evaluation - 12/26/22 1349       Psychosocial Evaluation & Interventions   Interventions Encouraged to exercise with the program and follow exercise prescription;Relaxation education;Stress management education    Comments Her depression and anxiety does not stem from any one thing. Nahima can look to her  sister, brother and husband. She is ready to get her health on track and start rehab.    Expected Outcomes Short: Start [facility] to help with mood. Long: Maintain a healthy mental state    Continue Psychosocial Services  Follow up required by staff             Psychosocial Re-Evaluation:  Psychosocial Re-Evaluation     Row Name 03/18/23 1621             Psychosocial Re-Evaluation   Current issues with None Identified       Comments Melanie Ray states she is doing well mentally. She takes Trazadone before her sleep which helps her. She just came back from a trip from [place]- her sister just got doctor in education. She is taking all of her medications appropriately and states she has no stress, anxiety, or depression symptoms at this time. She is enjoying coming to rehab. Encouraged her to reach out should anything change.       Expected Outcomes Short: Continue coming to rehab for mood boost Long: Continue to maintain positive attitude and utilize exercise for stress management       Interventions Encouraged to attend Cardiac Rehabilitation for the exercise       Continue Psychosocial Services  Follow up required by staff                Psychosocial Discharge (Final Psychosocial Re-Evaluation):  Psychosocial Re-Evaluation - 03/18/23 1621       Psychosocial Re-Evaluation   Current issues with None Identified    Comments Melanie Ray states she is doing well mentally. She takes Trazadone before her sleep which helps her. She just came back from a trip from [place]- her sister just got doctor in education. She is taking all of her medications appropriately and states she has no stress, anxiety, or depression symptoms at this time. She is enjoying coming to rehab. Encouraged her to reach out should anything change.    Expected Outcomes Short: Continue coming to rehab for mood boost Long: Continue to maintain positive attitude and utilize exercise for stress management    Interventions  Encouraged to attend Cardiac Rehabilitation for the exercise    Continue Psychosocial Services  Follow up required by staff             Vocational Rehabilitation: Provide vocational rehab assistance to qualifying candidates.   Vocational Rehab Evaluation & Intervention:   Education: Education Goals: Education classes will be provided on  a variety of topics geared toward better understanding of heart health and risk factor modification. Participant will state understanding/return demonstration of topics presented as noted by education test scores.  Learning Barriers/Preferences:  Learning Barriers/Preferences - 12/26/22 1342       Learning Barriers/Preferences   Learning Barriers None    Learning Preferences None             General Cardiac Education Topics:  AED/CPR: - Group verbal and written instruction with the use of models to demonstrate the basic use of the AED with the basic ABC's of resuscitation.   Anatomy and Cardiac Procedures: - Group verbal and visual presentation and models provide information about basic cardiac anatomy and function. Reviews the testing methods done to diagnose heart disease and the outcomes of the test results. Describes the treatment choices: Medical Management, Angioplasty, or Coronary Bypass Surgery for treating various heart conditions including Myocardial Infarction, Angina, Valve Disease, and Cardiac Arrhythmias.  Written material given at graduation.   Medication Safety: - Group verbal and visual instruction to review commonly prescribed medications for heart and lung disease. Reviews the medication, class of the drug, and side effects. Includes the steps to properly store meds and maintain the prescription regimen.  Written material given at graduation.   Intimacy: - Group verbal instruction through game format to discuss how heart and lung disease can affect sexual intimacy. Written material given at graduation..   Know Your  Numbers and Heart Failure: - Group verbal and visual instruction to discuss disease risk factors for cardiac and pulmonary disease and treatment options.  Reviews associated critical values for Overweight/Obesity, Hypertension, Cholesterol, and Diabetes.  Discusses basics of heart failure: signs/symptoms and treatments.  Introduces Heart Failure Zone chart for action plan for heart failure.  Written material given at graduation.   Infection Prevention: - Provides verbal and written material to individual with discussion of infection control including proper hand washing and proper equipment cleaning during exercise session. Flowsheet Row Cardiac Rehab from 01/28/2023 in [facility] Cardiac and Pulmonary Rehab  Date 01/28/23  Educator [facility]  Instruction Review Code 1- Verbalizes Understanding       Falls Prevention: - Provides verbal and written material to individual with discussion of falls prevention and safety. Flowsheet Row Cardiac Rehab from 01/28/2023 in [facility] Cardiac and Pulmonary Rehab  Date 01/28/23  Educator [facility]  Instruction Review Code 1- Verbalizes Understanding       Other: -Provides group and verbal instruction on various topics (see comments)   Knowledge Questionnaire Score:  Knowledge Questionnaire Score - 02/05/23 1016       Knowledge Questionnaire Score   Pre Score 22/26             Core Components/Risk Factors/Patient Goals at Admission:  Personal Goals and Risk Factors at Admission - 01/28/23 1716       Core Components/Risk Factors/Patient Goals on Admission    Weight Management Yes;Weight Maintenance    Intervention Weight Management: Develop a combined nutrition and exercise program designed to reach desired caloric intake, while maintaining appropriate intake of nutrient and fiber, sodium and fats, and appropriate energy expenditure required for the weight goal.;Weight Management: Provide education and appropriate resources to help participant work on and  attain dietary goals.;Weight Management/Obesity: Establish reasonable short term and long term weight goals.    Admit Weight 132 lb 1.6 oz (59.9 kg)    Goal Weight: Short Term 132 lb (59.9 kg)    Goal Weight: Long Term 132 lb (59.9 kg)  Expected Outcomes Short Term: Continue to assess and modify interventions until short term weight is achieved;Long Term: Adherence to nutrition and physical activity/exercise program aimed toward attainment of established weight goal;Weight Maintenance: Understanding of the daily nutrition guidelines, which includes 25-35% calories from fat, 7% or less cal from saturated fats, less than 200mg  cholesterol, less than 1.5gm of sodium, & 5 or more servings of fruits and vegetables daily;Understanding recommendations for meals to include 15-35% energy as protein, 25-35% energy from fat, 35-60% energy from carbohydrates, less than 200mg  of dietary cholesterol, 20-35 gm of total fiber daily;Understanding of distribution of calorie intake throughout the day with the consumption of 4-5 meals/snacks    Diabetes Yes    Intervention Provide education about signs/symptoms and action to take for hypo/hyperglycemia.;Provide education about proper nutrition, including hydration, and aerobic/resistive exercise prescription along with prescribed medications to achieve blood glucose in normal ranges: Fasting glucose 65-99 mg/dL    Expected Outcomes Short Term: Participant verbalizes understanding of the signs/symptoms and immediate care of hyper/hypoglycemia, proper foot care and importance of medication, aerobic/resistive exercise and nutrition plan for blood glucose control.;Long Term: Attainment of HbA1C < 7%.    Heart Failure Yes    Intervention Provide a combined exercise and nutrition program that is supplemented with education, support and counseling about heart failure. Directed toward relieving symptoms such as shortness of breath, decreased exercise tolerance, and extremity edema.     Expected Outcomes Improve functional capacity of life;Short term: Attendance in program 2-3 days a week with increased exercise capacity. Reported lower sodium intake. Reported increased fruit and vegetable intake. Reports medication compliance.;Long term: Adoption of self-care skills and reduction of barriers for early signs and symptoms recognition and intervention leading to self-care maintenance.;Short term: Daily weights obtained and reported for increase. Utilizing diuretic protocols set by physician.    Hypertension Yes    Intervention Provide education on lifestyle modifcations including regular physical activity/exercise, weight management, moderate sodium restriction and increased consumption of fresh fruit, vegetables, and low fat dairy, alcohol moderation, and smoking cessation.;Monitor prescription use compliance.    Expected Outcomes Short Term: Continued assessment and intervention until BP is < 140/18mm HG in hypertensive participants. < 130/62mm HG in hypertensive participants with diabetes, heart failure or chronic kidney disease.;Long Term: Maintenance of blood pressure at goal levels.    Lipids Yes    Intervention Provide education and support for participant on nutrition & aerobic/resistive exercise along with prescribed medications to achieve LDL 70mg , HDL >40mg .    Expected Outcomes Short Term: Participant states understanding of desired cholesterol values and is compliant with medications prescribed. Participant is following exercise prescription and nutrition guidelines.;Long Term: Cholesterol controlled with medications as prescribed, with individualized exercise RX and with personalized nutrition plan. Value goals: LDL < 70mg , HDL > 40 mg.             Education:Diabetes - Individual verbal and written instruction to review signs/symptoms of diabetes, desired ranges of glucose level fasting, after meals and with exercise. Acknowledge that pre and post exercise glucose  checks will be done for 3 sessions at entry of program. Flowsheet Row Cardiac Rehab from 01/28/2023 in [facility] Cardiac and Pulmonary Rehab  Date 01/28/23  Educator [facility]  Instruction Review Code 1- Verbalizes Understanding       Core Components/Risk Factors/Patient Goals Review:   Goals and Risk Factor Review     Row Name 03/18/23 1619             Core Components/Risk Factors/Patient Goals Review  Personal Goals Review Weight Management/Obesity;Heart Failure;Diabetes;Hypertension       Review Jonnette states she is maintaining her weight between 130-135 lb. She knows to look for any potential fluid gain with a sudden weight gain. Denies any heart failure symptoms at this time. Does not have a BP cuff but encouraged to get one so she can monitor her BP at home. She is diet controlled for her diabetes and sees PCP this Wednesday, 5/15. and may get A1C checked then.       Expected Outcomes Short: See PCP on Wednesday. buy BP cuff for at home monitoring Long: Continue to manage lifestyle risk factors                Core Components/Risk Factors/Patient Goals at Discharge (Final Review):   Goals and Risk Factor Review - 03/18/23 1619       Core Components/Risk Factors/Patient Goals Review   Personal Goals Review Weight Management/Obesity;Heart Failure;Diabetes;Hypertension    Review Nevaeha states she is maintaining her weight between 130-135 lb. She knows to look for any potential fluid gain with a sudden weight gain. Denies any heart failure symptoms at this time. Does not have a BP cuff but encouraged to get one so she can monitor her BP at home. She is diet controlled for her diabetes and sees PCP this Wednesday, 5/15. and may get A1C checked then.    Expected Outcomes Short: See PCP on Wednesday. buy BP cuff for at home monitoring Long: Continue to manage lifestyle risk factors             ITP Comments:  ITP Comments     Row Name 12/26/22 1341 01/28/23 1647 02/05/23 1006 02/20/23  1428 03/20/23 1204   ITP Comments Virtual Visit completed. Patient informed on EP and RD appointment and 6 Minute walk test. Patient also informed of patient health questionnaires on My Chart. Patient Verbalizes understanding. Visit diagnosis can be found in [facility] 10/24/2022. Completed and gym orientation. Initial ITP created and sent for review to Dr. Daniel Nones, Medical Director. First full day of exercise!  Patient was oriented to gym and equipment including functions, settings, policies, and procedures.  Patient's individual exercise prescription and treatment plan were reviewed.  All starting workloads were established based on the results of the 6 minute walk test done at initial orientation visit.  The plan for exercise progression was also introduced and progression will be customized based on patient's performance and goals. 30 day review completed. ITP sent to Dr. Bethann Punches, Medical Director of Cardiac Rehab. Continue with ITP unless changes are made by physician. 30 Day review completed. Medical Director ITP review done, changes made as directed, and signed approval by Medical Director.            Comments:

## 2023-03-20 NOTE — Assessment & Plan Note (Signed)
Chronic issue.  Patient continues to have depression.  She is on Vraylar and trazodone through her psychiatrist.  Discussed that patient needs to contact the new psychiatrist to schedule an appointment and she needs to relay what is going on with her as that may expedite her getting into see them.  If she is not able to get into see a new psychiatrist needs to call her old psychiatrist.  She was advised to seek medical attention in the emergency department if she develops intent or plan to harm herself.  Patient is very aware of this and has been dealing with chronic intermittent passive suicidal ideation for quite some time.

## 2023-03-20 NOTE — Assessment & Plan Note (Signed)
Chronic issue.  Appears euvolemic today.  She will continue spironolactone 12.5 mg daily, Lasix 40 mg daily, and carvedilol 6.25 mg twice daily.

## 2023-03-20 NOTE — Patient Instructions (Signed)
Nice to see you. Please call your new psychiatry office and see if he can schedule an appointment with them.  Please let them know what is going on as that may expedite you getting into see them.  If you are not able to get an appointment with them in the near future please call your old psychiatrist office. If you develop plan or intent to harm yourself please seek medical attention immediately in the emergency department.

## 2023-03-20 NOTE — Progress Notes (Signed)
Marikay Alar, MD Phone: [phone]  Melanie Ray is a 72 y.o. female who presents today for follow-up.  DIABETES Disease Monitoring: Blood Sugar ranges-not checking Polyuria/phagia/dipsia- no      Optho- UTD, reports saw in the past month Medications: Compliance-currently not on medication.  Hypoglycemic symptoms- no  CHF: Patient is on carvedilol, Lasix, and spironolactone.  No chest pain or shortness of breath.  She has chronic intermittent swelling in her feet that is stable.  No orthopnea or PND.  Anxiety/depression: Patient notes increased anxiety and depression recently with her husband being sick in the hospital with dementia that is progressive and her dog having incontinence issues and having to go outside to go to the bathroom every hour.  She saw her psychiatrist a month ago and they released her as they felt she needed to see somebody with more expertise.  They referred her to someone in [place] though the patient has not called to make an appointment yet.  She has chronic issues with passive suicidal ideation.  She notes that is a little worse now though she notes no plan or intent to harm herself.   Social History   Tobacco Use  Smoking Status Former   Packs/day: 0.50   Years: 25.00   Additional pack years: 0.00   Total pack years: 12.50   Types: Cigarettes   Quit date: 04/13/1997   Years since quitting: 25.9  Smokeless Tobacco Never    Current Outpatient Medications on File Prior to Visit  Medication Sig Dispense Refill   aspirin EC 81 MG tablet Take 1 tablet (81 mg total) by mouth daily. Swallow whole. 30 tablet 12   atorvastatin (LIPITOR) 80 MG tablet Take 1 tablet by mouth once daily 90 tablet 0   carvedilol (COREG) 12.5 MG tablet Take 0.5 tablets (6.25 mg total) by mouth 2 (two) times daily with a meal. 60 tablet 1   dorzolamide-timolol (COSOPT) 22.3-6.8 MG/ML ophthalmic solution Place 1 drop into both eyes 2 (two) times daily.      furosemide  (LASIX) 40 MG tablet Take 40 mg by mouth as needed.     gabapentin (NEURONTIN) 100 MG capsule Take 100 mg by mouth 2 (two) times daily.     latanoprost (XALATAN) 0.005 % ophthalmic solution Place 1 drop into both eyes at bedtime.     spironolactone (ALDACTONE) 25 MG tablet Take 0.5 tablets (12.5 mg total) by mouth daily. 45 tablet 3   traZODone (DESYREL) 150 MG tablet Take 150 mg by mouth at bedtime.     No current facility-administered medications on file prior to visit.     ROS see history of present illness  Objective  Physical Exam Vitals:   03/20/23 1157 03/20/23 1209  BP: 128/82 130/82  Pulse: 81   Temp: 98.5 F (36.9 C)   SpO2: 98%     BP Readings from Last 3 Encounters:  03/20/23 130/82  01/17/23 112/68  01/07/23 (!) 144/70   Wt Readings from Last 3 Encounters:  03/20/23 134 lb 9.6 oz (61.1 kg)  01/28/23 132 lb 1.6 oz (59.9 kg)  01/17/23 132 lb 6.4 oz (60.1 kg)    Physical Exam Constitutional:      General: She is not in acute distress.    Appearance: She is not diaphoretic.  Cardiovascular:     Rate and Rhythm: Normal rate and regular rhythm.     Heart sounds: Normal heart sounds.  Pulmonary:     Effort: Pulmonary effort is normal.  Breath sounds: Normal breath sounds.  Musculoskeletal:     Right lower leg: No edema.     Left lower leg: No edema.  Skin:    General: Skin is warm and dry.  Neurological:     Mental Status: She is alert.      Assessment/Plan: Please see individual problem list.  Chronic systolic CHF (congestive heart failure) (HCC) Assessment & Plan: Chronic issue.  Appears euvolemic today.  She will continue spironolactone 12.5 mg daily, Lasix 40 mg daily, and carvedilol 6.25 mg twice daily.  Orders: -     Comprehensive metabolic panel  Type 2 diabetes mellitus without complication, without long-term current use of insulin (HCC) Assessment & Plan: Chronic issue.  Undetermined control.  Check A1c today.  Orders: -      Hemoglobin A1c  Mixed hyperlipidemia Assessment & Plan: Check lipid panel.  Continue Lipitor 80 mg daily.  Orders: -     Comprehensive metabolic panel -     Lipid panel  Moderate episode of recurrent major depressive disorder (HCC) Assessment & Plan: Chronic issue.  Patient continues to have depression.  She is on Vraylar and trazodone through her psychiatrist.  Discussed that patient needs to contact the new psychiatrist to schedule an appointment and she needs to relay what is going on with her as that may expedite her getting into see them.  If she is not able to get into see a new psychiatrist needs to call her old psychiatrist.  She was advised to seek medical attention in the emergency department if she develops intent or plan to harm herself.  Patient is very aware of this and has been dealing with chronic intermittent passive suicidal ideation for quite some time.   Anxiety     Return in about 2 weeks (around 04/03/2023) for Depression.   Marikay Alar, MD [facility]

## 2023-03-20 NOTE — Telephone Encounter (Signed)
At check-out, scheduled patient for next available appointment on 04/08/2023.  Dr. Bernardo Heater check-out note indicates he would like to see patient again in two weeks for depression, but we do not have any appointments available in that timeframe.

## 2023-03-21 ENCOUNTER — Ambulatory Visit (INDEPENDENT_AMBULATORY_CARE_PROVIDER_SITE_OTHER): Payer: Medicare Other | Admitting: Psychology

## 2023-03-21 ENCOUNTER — Encounter: Payer: Self-pay | Admitting: Psychology

## 2023-03-21 ENCOUNTER — Ambulatory Visit: Payer: Medicare Other

## 2023-03-21 DIAGNOSIS — F411 Generalized anxiety disorder: Secondary | ICD-10-CM

## 2023-03-21 DIAGNOSIS — I639 Cerebral infarction, unspecified: Secondary | ICD-10-CM

## 2023-03-21 DIAGNOSIS — F331 Major depressive disorder, recurrent, moderate: Secondary | ICD-10-CM | POA: Diagnosis not present

## 2023-03-21 DIAGNOSIS — R4189 Other symptoms and signs involving cognitive functions and awareness: Secondary | ICD-10-CM

## 2023-03-21 NOTE — Telephone Encounter (Signed)
Noted  

## 2023-03-21 NOTE — Progress Notes (Signed)
   Psychometrician Note   Cognitive testing was administered to [name] by Shan Levans, B.S. (psychometrist) under the supervision of Dr. Newman Nickels, Ph.D., licensed psychologist on 03/21/2023. Ms. Pasek did not appear overtly distressed by the testing session per behavioral observation or responses across self-report questionnaires. Rest breaks were offered.    The battery of tests administered was selected by Dr. Newman Nickels, Ph.D. with consideration to Ms. Milbrath's current level of functioning, the nature of her symptoms, emotional and behavioral responses during interview, level of literacy, observed level of motivation/effort, and the nature of the referral question. This battery was communicated to the psychometrist. Communication between Dr. Newman Nickels, Ph.D. and the psychometrist was ongoing throughout the evaluation and Dr. Newman Nickels, Ph.D. was immediately accessible at all times. Dr. Newman Nickels, Ph.D. provided supervision to the psychometrist on the date of this service to the extent necessary to assure the quality of all services provided.    [name] will return within approximately 1-2 weeks for an interactive feedback session with Dr. Milbert Coulter at which time her test performances, clinical impressions, and treatment recommendations will be reviewed in detail. Ms. Koprowski understands she can contact our office should she require our assistance before this time.  A total of 125 minutes of billable time were spent face-to-face with Ms. Bena by the psychometrist. This includes both test administration and scoring time. Billing for these services is reflected in the clinical report generated by Dr. Newman Nickels, Ph.D.  This note reflects time spent with the psychometrician and does not include test scores or any clinical interpretations made by Dr. Milbert Coulter. The full report will follow in a separate note.

## 2023-03-21 NOTE — Telephone Encounter (Signed)
Patient is suppose to follow up for depression in 2 weeks and there is only 2 same day appointments on 04/10/23 at 10:00 and 10:15 so Olegario Messier said ask you if we could make that a 30 minute and use it?

## 2023-03-21 NOTE — Progress Notes (Signed)
NEUROPSYCHOLOGICAL EVALUATION [place] Department of Neurology  Date of Evaluation: Mar 21, 2023  Reason for Referral:   Melanie Ray is a 72 y.o. left-handed African-American female referred by Nita Sickle, D.O., to characterize her current cognitive functioning and assist with diagnostic clarity and treatment planning in the context of subjective cognitive decline, numerous medical comorbidities, and significant psychiatric distress.   Assessment and Plan:   Clinical Impression(s): Melanie Ray pattern of performance is suggestive of neuropsychological functioning within normal limits relative to age-matched peers. Some variability was exhibited across processing speed, basic attention, and semantic fluency. However, performances were largely appropriate. Performances were also appropriate relative to age-matched peers across complex attention, executive functioning, safety/judgment, receptive language, phonemic fluency, confrontation naming, visuospatial abilities, and all aspects of learning and memory. Melanie Ray largely denied difficulties completing instrumental activities of daily living (ADLs) independently outside of sporadic instances where she might forget to take a medication or administer medication to her husband/dog. Given test scores, Melanie Ray does not warrant diagnostic consideration for a neurocognitive disorder.  Despite her report of severe memory impairment and forgetting information "instantly" during interview, memory performances were quite strong across all memory tasks. There is no objective evidence to suggest any degree of memory impairment and certainly no concerns surrounding underlying symptomatic Alzheimer's disease at the present time. Performance variability across testing and subjective day-to-day dysfunction is likely multifactorial in nature. Causes would include her extensive cardiovascular/medical  history and evidence for prior small infarcts in the left thalamus and left cerebellum, significant and chronic psychiatric distress, several prominent acute psychosocial stressors involving the health of her husband and dog, and untreated obstructive sleep apnea. Continued monitoring over time will be beneficial.  Recommendations: A combination of medication and psychotherapy has been shown to be most effective at treating symptoms of anxiety and depression. As such, Melanie Ray is encouraged to speak with her prescribing physician regarding medication adjustments to optimally manage these symptoms.   Likewise, Melanie Ray is strongly encouraged to consider engaging in short-term psychotherapy to address symptoms of psychiatric distress. She would benefit from an active and collaborative therapeutic environment, rather than one purely supportive in nature. Recommended treatment modalities include Cognitive Behavioral Therapy (CBT) or Acceptance and Commitment Therapy (ACT).  Untreated sleep apnea can both create and maintain/exacerbate cognitive dysfunction. It will also increase her risk for additional strokes, heart attack, and a future dementia presentation. She is strongly encouraged to adhere to CPAP treatment or other treatments to address this condition.   Melanie Ray is encouraged to attend to lifestyle factors for brain health (e.g., regular physical exercise, good nutrition habits and consideration of the MIND-DASH diet, regular participation in cognitively-stimulating activities, and general stress management techniques), which are likely to have benefits for both emotional adjustment and cognition. In fact, in addition to promoting good general health, regular exercise incorporating aerobic activities (e.g., brisk walking, jogging, cycling, etc.) has been demonstrated to be a very effective treatment for depression and stress, with similar efficacy rates to both antidepressant  medication and psychotherapy. Optimal control of vascular risk factors (including safe cardiovascular exercise and adherence to dietary recommendations) is encouraged. Continued participation in activities which provide mental stimulation and social interaction is also recommended.   Memory can be improved using internal strategies such as rehearsal, repetition, chunking, mnemonics, association, and imagery. External strategies such as written notes in a consistently used memory journal, visual and nonverbal auditory cues such as a calendar on the refrigerator or  appointments with alarm, such as on a cell phone, can also help maximize recall.    To address problems with processing speed, she may wish to consider:   -Ensuring that she is alerted when essential material or instructions are being presented   -Adjusting the speed at which new information is presented   -Allowing for more time in comprehending, processing, and responding in conversation   -Repeating and paraphrasing instructions or conversations aloud  To address problems with fluctuating attention and/or executive dysfunction, she may wish to consider:   -Avoiding external distractions when needing to concentrate   -Limiting exposure to fast paced environments with multiple sensory demands   -Writing down complicated information and using checklists   -Attempting and completing one task at a time (i.e., no multi-tasking)   -Verbalizing aloud each step of a task to maintain focus   -Taking frequent breaks during the completion of steps/tasks to avoid fatigue   -Reducing the amount of information considered at one time   -Scheduling more difficult activities for a time of day where she is usually most alert  Review of Records:   Melanie Ray was seen by [facility] Nita Sickle, D.O.) on 07/25/2022 for follow-up of longstanding dizziness and new complaints of memory loss. Regarding the former, dizziness was said to be a  chronic condition with unclear etiology. Spells are normally short-lasting and self-limiting within 5-10 minutes. There was no evidence of central vertigo and she has completed vestibular therapy in the past. Regarding the latter, she described herself becoming increasingly forgetful, including trouble managing or forgetting to take medications within the past month or so. She described severe psychiatric distress, including depressive symptoms with passive suicidal ideation "all the time." She denied an active plan or intent to act upon these thoughts. She was scheduled to meet with a psychiatrist in the upcoming week. Outside of medication concerns, ADLs were described as intact. She expressed concern about dementia and reported that she had started taking her husband's memantine. Dr. Allena Katz did not prescribe memory medications due to concerns that current difficulties were primarily psychiatric in nature. Performance on a brief cognitive screening instrument (MOCA) was 23/30. Ultimately, Ms. Marius was referred for a comprehensive neuropsychological evaluation to characterize her cognitive abilities and to assist with diagnostic clarity and treatment planning.   Brain MRI on 04/19/2017 revealed mild cortical atrophy and mild microvascular ischemic disease. Brain MRI on 03/30/2020 revealed a punctate focus of acute ischemia within the dorsolateral left thalamus, as well as a small, remote left cerebellar infarct. No more recent neuroimaging was available for review.   Past Medical History:  Diagnosis Date   ABLA (acute blood loss anemia) 10/24/2022   AICD (automatic cardioverter/defibrillator) present    Allergic rhinitis 09/25/2022   Back pain    Benign neoplasm of colon 04/27/2008   CAD (coronary artery disease) 04/11/2016   S/p ant STEMI 5/17: LHC >> LAD proximal 80%, mid 80%, distal 50%, ostial D1 60%; LCx with LPDA lesion 30%; RCA Mild calcification with no significant stenosis in a medium  caliber, nondominant RCA; LVEF is estimated at 45% with inferoapical and lateral wall akinesis >> PCI: PCI: 3.5 x 24 mm Promus DES to prox LAD, 2.5 x 12 mm Promus DES to mid LAD.   Cerebellar stroke 03/30/2020   2021 MRI - Small, old left cerebellar infarct    Chest pain    Chronic HFrEF (heart failure with reduced ejection fraction) 10/25/2022   Chronic systolic CHF (congestive heart failure) 03/21/2016  Echo 01/30/17: Diff HK, mild focal basal septal hypertrophy, EF 30-35, mild AI, MAC, mild MR // Echo 06/08/16: Mild focal basal septal hypertrophy, EF 25-30%, diff HK, ant-septal AK, Gr 1 DD, mild AI, MAC, mild MR, PASP 37 mmHg // Echo 03/18/16: EF 30-35%, ant-septal AK, Gr 1 DD, mild MR, severe LAE.     Constipation    Diarrhea 01/02/2023   Dizziness    Dyspnea    Endotracheal tube present 10/24/2022   Essential hypertension 04/27/2008   Well-controlled today.  She will continue valsartan 80 mg twice a day, Imdur 30 mg daily, Lasix 20 mg every other day, [name] 10 mg daily, and carvedilol 12.5 mg twice daily.   Finger fracture 08/15/2021   She reports this is well-healing.  She will continue to see orthopedics.   Generalized anxiety disorder 04/27/2008   Glaucoma    Gout    History of acute anterior wall MI 03/17/2016   History of colonoscopy 04/27/2008   History of COVID-19 06/27/2022   HLD (hyperlipidemia) 07/28/2015   Check lipid panel.  She will continue Lipitor.   Hot flash, menopausal 02/07/2018   Will check with our clinical pharmacist regarding the black cohosh and primrose in this patient.   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   Iron deficiency anemia, unspecified 04/27/2008   Ischemic cardiomyopathy 10/02/2016   Joint pain    Left bundle branch block 12/03/2018   Chronic.  She will follow with cardiology.   Major  depressive disorder 07/28/2015   Chronic ongoing issue.  Has worsened somewhat recently.  Notes passive SI.  Advised if she develops intent or plan to harm herself she needs to go to the emergency room.  I advised her to contact her psychiatrist to arrange for follow-up and she was willing to do so.  She does have protective factors in place including her husband, dog, and nephew.   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  She will follow with cardiology.  We will send her EKG to her psychiatrist and have CMA contact the psychiatry office to inform them that the EKG was faxed and of the results.   Rash 02/07/2018   Possibly related to folliculitis or some other undetermined cause of her rash.  She will trial over-the-counter antibiotic ointment and if not beneficial she will let us know and we can refer her to dermatology.   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 Excell Seltzer and Dr. 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  She will continue [name] 10 mg daily.   Uterine leiomyoma 04/27/2008   Vitamin D deficiency 09/15/2010    Past Surgical History:  Procedure Laterality Date   APPENDECTOMY     BIV ICD INSERTION CRT-D N/A 01/02/2022   Procedure: BIV ICD INSERTION CRT-D;  Surgeon: Lanier Prude, MD;  Location: [facility] INVASIVE CV LAB;  Service: Cardiovascular;  Laterality: N/A;   CARDIAC CATHETERIZATION N/A 03/17/2016   Procedure: Left Heart Cath and Coronary Angiography;   Surgeon: Tonny Bollman, MD;  Location: [facility] INVASIVE CV LAB;  Service: Cardiovascular;  Laterality: N/A;   CARDIAC CATHETERIZATION N/A 03/17/2016   Procedure: Coronary Stent Intervention;  Surgeon: Tonny Bollman, MD;  Location: [facility] INVASIVE CV LAB;  Service: Cardiovascular;  Laterality: N/A;   COLONOSCOPY WITH PROPOFOL N/A 08/13/2017   Procedure: COLONOSCOPY WITH PROPOFOL;  Surgeon: Wyline Mood, MD;  Location: [facility] ENDOSCOPY;  Service: Gastroenterology;  Laterality: N/A;   COLONOSCOPY WITH PROPOFOL N/A 03/29/2020   Procedure: COLONOSCOPY WITH PROPOFOL;  Surgeon: Midge Minium, MD;  Location: [facility] ENDOSCOPY;  Service: Endoscopy;  Laterality: N/A;   EXPLORATION POST OPERATIVE OPEN HEART N/A 10/24/2022   Procedure: EXPLORATION POST OPERATIVE OPEN HEART;  Surgeon: Eugenio Hoes, MD;  Location: [facility] OR;  Service: Open Heart Surgery;  Laterality: N/A;   MITRAL VALVE REPAIR N/A 10/04/2022   Procedure: MITRAL VALVE REPAIR;  Surgeon: Tonny Bollman, MD;  Location: [facility] INVASIVE CV LAB;  Service: Cardiovascular;  Laterality: N/A;   MITRAL VALVE REPLACEMENT N/A 10/24/2022   Procedure: MITRAL VALVE (MV) REPLACEMENT USING A 29 MM MEDTRONIC MOSAIC 310;  Surgeon: Eugenio Hoes, MD;  Location: [facility] OR;  Service: Open Heart Surgery;  Laterality: N/A;   RIGHT HEART CATH N/A 10/15/2022   Procedure: RIGHT HEART CATH;  Surgeon: Tonny Bollman, MD;  Location: [facility] INVASIVE CV LAB;  Service: Cardiovascular;  Laterality: N/A;   RIGHT/LEFT HEART CATH AND CORONARY ANGIOGRAPHY N/A 08/02/2022   Procedure: RIGHT/LEFT HEART CATH AND CORONARY ANGIOGRAPHY;  Surgeon: Dolores Patty, MD;  Location: [facility] INVASIVE CV LAB;  Service: Cardiovascular;  Laterality: N/A;   SMALL BOWEL REPAIR     TEE WITHOUT CARDIOVERSION N/A 08/02/2022   Procedure: TRANSESOPHAGEAL ECHOCARDIOGRAM (TEE);  Surgeon: Dolores Patty, MD;  Location: [facility] ENDOSCOPY;  Service: Cardiovascular;  Laterality: N/A;   TEE WITHOUT CARDIOVERSION N/A 10/04/2022   Procedure:  TRANSESOPHAGEAL ECHOCARDIOGRAM (TEE);  Surgeon: Tonny Bollman, MD;  Location: [facility] INVASIVE CV LAB;  Service: Cardiovascular;  Laterality: N/A;   TEE WITHOUT CARDIOVERSION N/A 10/24/2022   Procedure: TRANSESOPHAGEAL ECHOCARDIOGRAM (TEE);  Surgeon: Eugenio Hoes, MD;  Location: [facility] OR;  Service: Open Heart Surgery;  Laterality: N/A;   TONSILLECTOMY     UTERINE FIBROID SURGERY      Current Outpatient Medications:    aspirin EC 81 MG tablet, Take 1 tablet (81 mg total) by mouth daily. Swallow whole., Disp: 30 tablet, Rfl: 12   atorvastatin (LIPITOR) 80 MG tablet, Take 1 tablet by mouth once daily, Disp: 90 tablet, Rfl: 0   carvedilol (COREG) 12.5 MG tablet, Take 0.5 tablets (6.25 mg total) by mouth 2 (two) times daily with a meal., Disp: 60 tablet, Rfl: 1   dorzolamide-timolol (COSOPT) 22.3-6.8 MG/ML ophthalmic solution, Place 1 drop into both eyes 2 (two) times daily. , Disp: , Rfl:    furosemide (LASIX) 40 MG tablet, Take 40 mg by mouth as needed., Disp: , Rfl:    gabapentin (NEURONTIN) 100 MG capsule, Take 100 mg by mouth 2 (two) times daily., Disp: , Rfl:    latanoprost (XALATAN) 0.005 % ophthalmic solution, Place 1 drop into both eyes at bedtime., Disp: , Rfl:    spironolactone (ALDACTONE) 25 MG tablet, Take 0.5 tablets (12.5 mg total) by mouth daily., Disp: 45 tablet, Rfl: 3   traZODone (DESYREL) 150 MG tablet, Take 150 mg by mouth at bedtime., Disp: , Rfl:   Clinical Interview:  The following information was obtained during a clinical interview with Ms. Sylvia prior to cognitive testing.  Cognitive Symptoms: Decreased short-term memory: Endorsed. She described her memory as "going rapidly," emphasizing that she forgets things "instantly." Difficulties were said to first become noticeable during the previous year (medical records suggest August 2023) and have progressively worsened over time per her report.  Decreased long-term memory: Denied. Decreased attention/concentration:  Endorsed. She reported trouble with sustained attention and increased distractibility.  Reduced processing speed: Endorsed. Difficulties with executive functions: Endorsed. She reported trouble with organization and multi-tasking. She denied trouble with impulsivity or any significant personality changes.  Difficulties with emotion regulation: Denied. Difficulties with receptive language: Denied. Difficulties with word finding: Endorsed. Decreased visuoperceptual ability: Denied.  Difficulties completing ADLs: Largely denied. She did report some instances where she will forget to take her medications, give her husband his medications, or give her dog its medications. No trouble with financial management, bill paying, or driving was described.   Additional Medical History: History of traumatic brain injury/concussion: She reported being diagnosed with a concussion around the age of 32. No persisting difficulties were reported. No more recent events were described.  History of stroke: Prior imaging has revealed small infarcts in the left dorsolateral thalamus and left cerebellum.  History of seizure activity: Denied. History of known exposure to toxins: Denied. Symptoms of chronic pain: Denied. Experience of frequent headaches/migraines: Denied. Frequent instances of dizziness/vertigo: Endorsed (see above).  Sensory changes: She reported hearing loss but not to the extent hearing aids have been prescribed. Other sensory changes/difficulties (e.g., vision, taste, smell) were denied.  Balance/coordination difficulties: Endorsed. Balance is impacted by dizzy spells. Outside of these spells, no prominent issues were noted. She denied any recent falls and one side of the body was not said to be worse than the other.  Other motor difficulties: Denied. However, she did report her perception of reduced grip strength bilaterally.   Sleep History: Estimated hours obtained each night: 4-6  hours Difficulties falling asleep: Denied. Difficulties staying asleep: Endorsed. She reported being able to fall asleep adequately but commonly wakes up throughout the night for unknown reasons. She will use trazodone to help her fall back asleep. Feels rested and refreshed upon awakening: "Never." She reported feeling exhausted throughout the day.   History of snoring: Endorsed. History of waking up gasping for air: Endorsed. Witnessed breath cessation while asleep: Endorsed. A prior sleep study in July 2018 suggested the presence of severe obstructive sleep apnea. Ms. Woodfill does not use a CPAP machine due to mask discomfort, making this condition untreated.   History of vivid dreaming: Denied. Excessive movement while asleep: Denied. Instances of acting out her dreams: Denied.  Psychiatric/Behavioral Health History: Depression: Endorsed. She reported a longstanding history of major depressive disorder with bouts of quite severe symptoms. She reported frequent experiences of passive suicidal ideation (i.e., she'd be "better off dead") but denied any current plan or intent to act upon these thoughts. In addition to baseline levels of depression, she also described acute stressors surrounding her husband's health (and her role as primary caregiver), as well as the health of their dog. She is monitored by a psychiatrist for medication management.  Anxiety: Endorsed. She additionally reported a history of generalized anxious distress, with bouts of quite severe symptoms over time.  Mania: Denied. Trauma History: Denied. Visual/auditory hallucinations: Denied. Delusional thoughts: Denied.  Tobacco: Denied. Alcohol: She denied current alcohol consumption as well as a history of problematic alcohol abuse or dependence.  Recreational drugs: Denied.  Family History: Problem Relation Age of Onset   Anxiety disorder Mother    Paranoid behavior Mother    Hypertension Mother    Dementia  Mother    High Cholesterol Mother    Diabetes Mother    Hyperlipidemia Mother    Depression Mother    Hypertension Father    High Cholesterol Father    Mood Disorder Sister    Stroke Sister    Anxiety disorder Maternal Aunt    Tuberculosis Paternal Grandfather    Drug abuse Cousin    This information was confirmed by Ms. Hauck.  Academic/Vocational History: Highest level of educational attainment: 16 years. She graduated from high school and earned a Oncologist in education. She described herself as an average (B) student in academic settings. Science-based courses were described as a relative weakness.  History of developmental delay: Denied. History of grade repetition: Denied. Enrollment in special education courses: Denied. History of LD/ADHD: Denied.  Employment: Retired. 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   Evaluation Results:   Behavioral Observations: Ms. Dixon was unaccompanied, arrived to her appointment on time, and was appropriately dressed and groomed. She appeared alert and oriented. Observed gait and station were within normal limits. Gross motor functioning appeared intact upon informal observation and no abnormal movements (e.g., tremors) were noted. Her affect was generally relaxed and positive. Spontaneous speech was fluent and word finding difficulties were not observed during the clinical interview. Thought processes were coherent, organized, and normal in content. Insight into her cognitive difficulties appeared adequate.   During testing, sustained attention was appropriate. Task engagement was adequate and she persisted when challenged. Overall, Ms. Douthitt was cooperative with the clinical interview and subsequent testing procedures.   Adequacy of Effort: The validity of neuropsychological testing is limited by the extent to which the individual being tested may be assumed to have exerted adequate effort during  testing. Ms. Kasza expressed her intention to perform to the best of her abilities and exhibited adequate task engagement and persistence. Scores across stand-alone and embedded performance validity measures were within expectation. As such, the results of the current evaluation are believed to be a valid representation of Ms. Tritch's current cognitive functioning.  Test Results: Ms. Mousel was fully oriented at the time of the current evaluation.  Intellectual abilities based upon educational and vocational attainment were estimated to be in the average range. Premorbid abilities were estimated to be within the average range based upon a single-word reading test.   Processing speed was mildly variable but overall adequate, ranging from the well below average to average normative ranges. Basic attention was variable, ranging from the well below average to average. More complex attention (e.g., working memory) was average. Executive functioning was below average to average. She performed in the above average range across a task assessing safety and judgment.  Assessed receptive language abilities were average. Likewise, Ms. Durrette did not exhibit any difficulties comprehending task instructions and answered all questions asked of her appropriately. Assessed expressive language was mildly variable but overall appropriate. Phonemic fluency was average, semantic fluency was well below average to average, and confrontation naming was average to above average.     Assessed visuospatial/visuoconstructional abilities were average.    Learning (i.e., encoding) of novel verbal information was average to exceptionally high. Spontaneous delayed recall (i.e., retrieval) of previously learned information was average to above average. Retention rates were likewise strong across all memory tasks. Performance across recognition tasks  was also strong, suggesting evidence for information consolidation.    Results of emotional screening instruments suggested that recent symptoms of generalized anxiety were in the moderate range, while symptoms of depression were also within the moderate range. A screening instrument assessing recent sleep quality suggested the presence of mild sleep dysfunction.  Tables of Scores:   Note: This summary of test scores accompanies the interpretive report and should not be considered in isolation without reference to the appropriate sections in the text. Descriptors are based on appropriate normative data and may be adjusted based on clinical judgment. Terms such as "Within Normal Limits" and "Outside Normal Limits" are used when a more specific description of the test score cannot be determined.       Percentile - Normative Descriptor > 98 - Exceptionally High 91-97 - Well Above Average 75-90 - Above Average 25-74 - Average 9-24 - Below Average 2-8 - Well Below Average < 2 - Exceptionally Low       Orientation:      Raw Score Percentile   NAB Orientation, Form 1 29/29 --- ---       Cognitive Screening:      Raw Score Percentile   SLUMS: 26/30 --- ---       RBANS, Form A: Standard Score/ Scaled Score Percentile   Total Score 91 27 Average  Immediate Memory 117 87 Above Average    List Learning 9 37 Average    Story Memory 17 99 Exceptionally High  Visuospatial/Constructional 100 50 Average    Figure Copy 10 50 Average    Line Orientation 17/20 51-75 Average  Language 85 16 Below Average    Picture Naming 10/10 51-75 Average    Semantic Fluency 4 2 Well Below Average  Attention 64 1 Exceptionally Low    Digit Span 5 5 Well Below Average    Coding 4 2 Well Below Average  Delayed Memory 103 58 Average    List Recall 5/10 51-75 Average    List Recognition 20/20 51-75 Average    Story Recall 12 75 Above Average    Story Recognition 12/12 69+ Average    Figure Recall 9 37 Average    Figure Recognition 6/8 30-52 Average        Intellectual  Functioning:      Standard Score Percentile   Test of Premorbid Functioning: 94 34 Average       Attention/Executive Function:     Trail Making Test (TMT): Raw Score (T Score) Percentile     Part A 44 secs.,  0 errors (48) 42 Average    Part B 190 secs.,  1 error (40) 16 Below Average         Scaled Score Percentile   WAIS-IV Digit Span: 8 25 Average    Forward 8 25 Average    Backward 9 37 Average    Sequencing 9 37 Average        Scaled Score Percentile   WAIS-IV Similarities: 8 25 Average       D-KEFS Color-Word Interference Test: Raw Score (Scaled Score) Percentile     Color Naming 41 secs. (7) 16 Below Average    Word Reading 31 secs. (7) 16 Below Average    Inhibition 97 secs. (6) 9 Below Average      Total Errors 2 errors (11) 63 Average    Inhibition/Switching 94 secs. (8) 25 Average      Total Errors 3 errors (10) 50 Average  D-KEFS Verbal Fluency Test: Raw Score (Scaled Score) Percentile     Letter Total Correct 29 (8) 25 Average    Category Total Correct 22 (6) 9 Below Average    Category Switching Total Correct 10 (8) 25 Average    Category Switching Accuracy 8 (7) 16 Below Average      Total Set Loss Errors 3 (9) 37 Average      Total Repetition Errors 2 (11) 63 Average       NAB Executive Functions Module, Form 1: T Score Percentile     Judgment 58 79 Above Average       Language:     Verbal Fluency Test: Raw Score (T Score) Percentile     Phonemic Fluency (FAS) 29 (46) 34 Average    Animal Fluency 14 (46) 34 Average        NAB Language Module, Form 1: T Score Percentile     Auditory Comprehension 53 62 Average    Naming 30/31 (57) 75 Above Average       Visuospatial/Visuoconstruction:      Raw Score Percentile   Clock Drawing: 10/10 --- Within Normal Limits        Scaled Score Percentile   WAIS-IV Block Design: 8 25 Average       Mood and Personality:      Raw Score Percentile   Beck Depression Inventory - II: 20 --- Moderate  PROMIS  Anxiety Questionnaire: 27 --- Moderate       Additional Questionnaires:      Raw Score Percentile   PROMIS Sleep Disturbance Questionnaire: 28 --- Mild   Informed Consent and Coding/Compliance:   The current evaluation represents a clinical evaluation for the purposes previously outlined by the referral source and is in no way reflective of a forensic evaluation.   Ms. Uden was provided with a verbal description of the nature and purpose of the present neuropsychological evaluation. Also reviewed were the foreseeable risks and/or discomforts and benefits of the procedure, limits of confidentiality, and mandatory reporting requirements of this provider. The patient was given the opportunity to ask questions and receive answers about the evaluation. Oral consent to participate was provided by the patient.   This evaluation was conducted by Newman Nickels, Ph.D., ABPP-CN, board certified clinical neuropsychologist. Ms. Sloas completed a clinical interview with Dr. Milbert Coulter, billed as one unit [phone], and 125 minutes of cognitive testing and scoring, billed as one unit [phone] and three additional units [id number]. Psychometrist Shan Levans, B.S., assisted Dr. Milbert Coulter with test administration and scoring procedures. As a separate and discrete service, one unit [id number] and two units [phone] were billed for Dr. Tammy Sours time spent in interpretation and report writing.

## 2023-03-27 ENCOUNTER — Encounter: Payer: Medicare Other | Admitting: *Deleted

## 2023-03-27 DIAGNOSIS — I5022 Chronic systolic (congestive) heart failure: Secondary | ICD-10-CM | POA: Diagnosis not present

## 2023-03-27 DIAGNOSIS — Z952 Presence of prosthetic heart valve: Secondary | ICD-10-CM

## 2023-03-27 NOTE — Progress Notes (Signed)
Daily Session Note  Patient Details  Name: Melanie Ray MRN: [id number] Date of Birth: 1951/06/11 Referring Provider:   Flowsheet Row Cardiac Rehab from 01/28/2023 in [facility] Cardiac and Pulmonary Rehab  Referring Provider Excell Seltzer       Encounter Date: 03/27/2023  Check In:  Session Check In - 03/27/23 1611       Check-In   Supervising physician immediately available to respond to emergencies See telemetry face sheet for immediately available ER MD    Location [facility]-Cardiac & Pulmonary Rehab    Staff Present Lanny Hurst, RN, ADN;Ledger Heindl Karleen Hampshire RN, BSN;Joseph [place], RCP,RRT,BSRT;Kelly Meyers, BS, ACSM CEP, Exercise Physiologist    Virtual Visit No    Medication changes reported     No    Fall or balance concerns reported    No    Tobacco Cessation No Change    Warm-up and Cool-down Performed on first and last piece of equipment    Resistance Training Performed Yes    VAD Patient? No    PAD/SET Patient? No      Pain Assessment   Currently in Pain? No/denies                Social History   Tobacco Use  Smoking Status Former   Packs/day: 0.50   Years: 25.00   Additional pack years: 0.00   Total pack years: 12.50   Types: Cigarettes   Quit date: 04/13/1997   Years since quitting: 25.9  Smokeless Tobacco Never    Goals Met:  Independence with exercise equipment Exercise tolerated well No report of concerns or symptoms today Strength training completed today  Goals Unmet:  Not Applicable  Comments: Pt able to follow exercise prescription today without complaint.  Will continue to monitor for progression.    Dr. Bethann Punches is Medical Director for [facility] Cardiac Rehabilitation.  Dr. Vida Rigger is Medical Director for [facility] Pulmonary Rehabilitation.

## 2023-03-29 ENCOUNTER — Ambulatory Visit (INDEPENDENT_AMBULATORY_CARE_PROVIDER_SITE_OTHER): Payer: Medicare Other | Admitting: Psychology

## 2023-03-29 ENCOUNTER — Encounter: Payer: Medicare Other | Admitting: Psychology

## 2023-03-29 DIAGNOSIS — F411 Generalized anxiety disorder: Secondary | ICD-10-CM | POA: Diagnosis not present

## 2023-03-29 DIAGNOSIS — F331 Major depressive disorder, recurrent, moderate: Secondary | ICD-10-CM | POA: Diagnosis not present

## 2023-03-29 DIAGNOSIS — I639 Cerebral infarction, unspecified: Secondary | ICD-10-CM

## 2023-03-29 DIAGNOSIS — R4189 Other symptoms and signs involving cognitive functions and awareness: Secondary | ICD-10-CM

## 2023-03-29 NOTE — Progress Notes (Signed)
   Neuropsychology Feedback Session Melanie Ray. [facility] Department of Neurology  Reason for Referral:   Melanie Ray is a 72 y.o. left-handed African-American female referred by Nita Sickle, D.O., to characterize her current cognitive functioning and assist with diagnostic clarity and treatment planning in the context of subjective cognitive decline, numerous medical comorbidities, and significant psychiatric distress.   Feedback:   Melanie Ray completed a comprehensive neuropsychological evaluation on 03/21/2023. Please refer to that encounter for the full report and recommendations. Briefly, results suggested neuropsychological functioning within normal limits relative to age-matched peers. Some variability was exhibited across processing speed, basic attention, and semantic fluency. However, performances were largely appropriate. Despite her report of severe memory impairment and forgetting information "instantly" during interview, memory performances were quite strong across all memory tasks. There is no objective evidence to suggest any degree of memory impairment and certainly no concerns surrounding underlying symptomatic Alzheimer's disease at the present time.   Melanie Ray was unaccompanied during the current feedback appointment. Content of the current session focused on the results of her neuropsychological evaluation. Melanie Ray was given the opportunity to ask questions and her questions were answered. She expressed a desire to be prescribed memory medications. I informed her that there is no medication which can simply improve memory and that available prescriptions are to slow a neurodegenerative process. Since there is no evidence of this and memory scores were quite solid, memory medication would likely be unnecessary. I advised her to discuss this with her prescribing physician more directly as I am unable to prescribed medication. She was  encouraged to reach out should additional questions arise. A copy of her report was provided at the conclusion of the visit.      One unit [phone] was billed for Dr. Tammy Sours time spent preparing for, conducting, and documenting the current feedback session with Melanie Ray.

## 2023-04-03 ENCOUNTER — Ambulatory Visit: Payer: Medicare Other | Admitting: Neurology

## 2023-04-03 ENCOUNTER — Encounter: Payer: Medicare Other | Admitting: *Deleted

## 2023-04-03 ENCOUNTER — Other Ambulatory Visit (HOSPITAL_COMMUNITY): Payer: Self-pay | Admitting: Internal Medicine

## 2023-04-03 DIAGNOSIS — Z952 Presence of prosthetic heart valve: Secondary | ICD-10-CM | POA: Diagnosis not present

## 2023-04-03 DIAGNOSIS — I5022 Chronic systolic (congestive) heart failure: Secondary | ICD-10-CM | POA: Diagnosis not present

## 2023-04-03 NOTE — Progress Notes (Signed)
Daily Session Note  Patient Details  Name: Loree Lomibao MRN: [id number] Date of Birth: Jul 29, 1951 Referring Provider:   Flowsheet Row Cardiac Rehab from 01/28/2023 in [facility] Cardiac and Pulmonary Rehab  Referring Provider Excell Seltzer       Encounter Date: 04/03/2023  Check In:  Session Check In - 04/03/23 1531       Check-In   Supervising physician immediately available to respond to emergencies See telemetry face sheet for immediately available ER MD    Location [facility]-Cardiac & Pulmonary Rehab    Staff Present Susann Givens, RN BSN;Megan Katrinka Blazing, RN, ADN;Joseph Reino [place], RCP,RRT,BSRT    Virtual Visit No    Medication changes reported     No    Tobacco Cessation No Change    Warm-up and Cool-down Performed on first and last piece of equipment    Resistance Training Performed Yes    VAD Patient? No    PAD/SET Patient? No      Pain Assessment   Currently in Pain? No/denies                Social History   Tobacco Use  Smoking Status Former   Packs/day: 0.50   Years: 25.00   Additional pack years: 0.00   Total pack years: 12.50   Types: Cigarettes   Quit date: 04/13/1997   Years since quitting: 25.9  Smokeless Tobacco Never    Goals Met:  Independence with exercise equipment Exercise tolerated well No report of concerns or symptoms today Strength training completed today  Goals Unmet:  Not Applicable  Comments: Pt able to follow exercise prescription today without complaint.  Will continue to monitor for progression.    Dr. Bethann Punches is Medical Director for [facility] Cardiac Rehabilitation.  Dr. Vida Rigger is Medical Director for [facility] Pulmonary Rehabilitation.

## 2023-04-05 ENCOUNTER — Ambulatory Visit: Payer: Medicare Other

## 2023-04-05 ENCOUNTER — Ambulatory Visit (INDEPENDENT_AMBULATORY_CARE_PROVIDER_SITE_OTHER): Payer: Medicare Other

## 2023-04-05 ENCOUNTER — Other Ambulatory Visit (HOSPITAL_COMMUNITY): Payer: Self-pay | Admitting: Internal Medicine

## 2023-04-05 ENCOUNTER — Institutional Professional Consult (permissible substitution): Payer: Medicare Other | Admitting: Psychology

## 2023-04-05 DIAGNOSIS — I447 Left bundle-branch block, unspecified: Secondary | ICD-10-CM

## 2023-04-08 ENCOUNTER — Encounter: Payer: Medicare Other | Attending: Cardiovascular Disease | Admitting: *Deleted

## 2023-04-08 ENCOUNTER — Ambulatory Visit: Payer: Medicare Other | Admitting: Family Medicine

## 2023-04-08 DIAGNOSIS — Z952 Presence of prosthetic heart valve: Secondary | ICD-10-CM | POA: Diagnosis not present

## 2023-04-08 DIAGNOSIS — I5022 Chronic systolic (congestive) heart failure: Secondary | ICD-10-CM | POA: Insufficient documentation

## 2023-04-08 LAB — CUP PACEART REMOTE DEVICE CHECK
Battery Remaining Longevity: 63 mo
Battery Remaining Percentage: 77 %
Battery Voltage: 2.99 V
Brady Statistic AP VP Percent: 93 %
Brady Statistic AP VS Percent: 1 %
Brady Statistic AS VP Percent: 5.7 %
Brady Statistic AS VS Percent: 1 %
Brady Statistic RA Percent Paced: 93 %
Date Time Interrogation Session: 20240601160303
HighPow Impedance: 50 Ohm
HighPow Impedance: 50 Ohm
Implantable Lead Connection Status: 753985
Implantable Lead Connection Status: 753985
Implantable Lead Connection Status: 753985
Implantable Lead Implant Date: 20230228
Implantable Lead Implant Date: 20230228
Implantable Lead Implant Date: 20230228
Implantable Lead Location: 753858
Implantable Lead Location: 753859
Implantable Lead Location: 753860
Implantable Lead Model: 3830
Implantable Pulse Generator Implant Date: 20230228
Lead Channel Impedance Value: 400 Ohm
Lead Channel Impedance Value: 440 Ohm
Lead Channel Impedance Value: 560 Ohm
Lead Channel Pacing Threshold Amplitude: 0.5 V
Lead Channel Pacing Threshold Amplitude: 0.625 V
Lead Channel Pacing Threshold Amplitude: 0.75 V
Lead Channel Pacing Threshold Pulse Width: 0.5 ms
Lead Channel Pacing Threshold Pulse Width: 0.5 ms
Lead Channel Pacing Threshold Pulse Width: 0.5 ms
Lead Channel Sensing Intrinsic Amplitude: 12 mV
Lead Channel Sensing Intrinsic Amplitude: 2.7 mV
Lead Channel Setting Pacing Amplitude: 2 V
Lead Channel Setting Pacing Amplitude: 2 V
Lead Channel Setting Pacing Amplitude: 2 V
Lead Channel Setting Pacing Pulse Width: 0.5 ms
Lead Channel Setting Pacing Pulse Width: 0.5 ms
Lead Channel Setting Sensing Sensitivity: 0.5 mV
Pulse Gen Serial Number: 8904264
Zone Setting Status: 755011

## 2023-04-08 NOTE — Progress Notes (Signed)
Daily Session Note  Patient Details  Name: Melanie Ray MRN: [id number] Date of Birth: 12-03-1950 Referring Provider:   Flowsheet Row Cardiac Rehab from 01/28/2023 in [facility] Cardiac and Pulmonary Rehab  Referring Provider Excell Seltzer       Encounter Date: 04/08/2023  Check In:  Session Check In - 04/08/23 1612       Check-In   Supervising physician immediately available to respond to emergencies See telemetry face sheet for immediately available ER MD    Location [facility]-Cardiac & Pulmonary Rehab    Staff Present Cyndia Diver, RN, BSN, Cammie Sickle, RCP,RRT,BSRT;Noah Tickle, BS, Exercise Physiologist    Virtual Visit No    Medication changes reported     No    Fall or balance concerns reported    No    Tobacco Cessation No Change    Warm-up and Cool-down Performed on first and last piece of equipment    Resistance Training Performed No    VAD Patient? No    PAD/SET Patient? No      Pain Assessment   Currently in Pain? No/denies                Social History   Tobacco Use  Smoking Status Former   Packs/day: 0.50   Years: 25.00   Additional pack years: 0.00   Total pack years: 12.50   Types: Cigarettes   Quit date: 04/13/1997   Years since quitting: 26.0  Smokeless Tobacco Never    Goals Met:  Independence with exercise equipment Exercise tolerated well No report of concerns or symptoms today  Goals Unmet:  Not Applicable  Comments: Pt able to follow exercise prescription today without complaint.  Will continue to monitor for progression.    Dr. Bethann Punches is Medical Director for [facility] Cardiac Rehabilitation.  Dr. Vida Rigger is Medical Director for [facility] Pulmonary Rehabilitation.

## 2023-04-09 ENCOUNTER — Other Ambulatory Visit (HOSPITAL_COMMUNITY): Payer: Self-pay | Admitting: *Deleted

## 2023-04-09 MED ORDER — ATORVASTATIN CALCIUM 80 MG PO TABS: 80.0000 mg | ORAL_TABLET | Freq: Every day | ORAL | 3 refills | Status: DC

## 2023-04-10 ENCOUNTER — Encounter: Payer: Medicare Other | Admitting: *Deleted

## 2023-04-10 DIAGNOSIS — I5022 Chronic systolic (congestive) heart failure: Secondary | ICD-10-CM | POA: Diagnosis not present

## 2023-04-10 DIAGNOSIS — Z952 Presence of prosthetic heart valve: Secondary | ICD-10-CM

## 2023-04-10 NOTE — Progress Notes (Signed)
Daily Session Note  Patient Details  Name: Melanie Ray MRN: [id number] Date of Birth: November 23, 1950 Referring Provider:   Flowsheet Row Cardiac Rehab from 01/28/2023 in [facility] Cardiac and Pulmonary Rehab  Referring Provider Excell Seltzer       Encounter Date: 04/10/2023  Check In:  Session Check In - 04/10/23 1553       Check-In   Supervising physician immediately available to respond to emergencies See telemetry face sheet for immediately available ER MD    Location [facility]-Cardiac & Pulmonary Rehab    Staff Present Bess Kinds RN, Mabeline Caras, BS, ACSM CEP, Exercise Physiologist;Joseph Reino [place], [place]    Virtual Visit No    Medication changes reported     No    Fall or balance concerns reported    No    Warm-up and Cool-down Performed on first and last piece of equipment    Resistance Training Performed Yes    VAD Patient? No    PAD/SET Patient? No      Pain Assessment   Currently in Pain? No/denies                Social History   Tobacco Use  Smoking Status Former   Packs/day: 0.50   Years: 25.00   Additional pack years: 0.00   Total pack years: 12.50   Types: Cigarettes   Quit date: 04/13/1997   Years since quitting: 26.0  Smokeless Tobacco Never    Goals Met:  Independence with exercise equipment Exercise tolerated well No report of concerns or symptoms today Strength training completed today  Goals Unmet:  Not Applicable  Comments: Pt able to follow exercise prescription today without complaint.  Will continue to monitor for progression.    Dr. Bethann Punches is Medical Director for [facility] Cardiac Rehabilitation.  Dr. Vida Rigger is Medical Director for [facility] Pulmonary Rehabilitation.

## 2023-04-11 ENCOUNTER — Telehealth (HOSPITAL_COMMUNITY): Payer: Self-pay | Admitting: *Deleted

## 2023-04-11 NOTE — Telephone Encounter (Signed)
Pt called asking for a refill of Valsartan I did not see Valsartan on her list or mentioned in the providers notes.    Routed to [employer]

## 2023-04-12 ENCOUNTER — Encounter: Payer: Medicare Other | Admitting: Psychology

## 2023-04-15 ENCOUNTER — Encounter: Payer: Medicare Other | Admitting: Psychology

## 2023-04-15 ENCOUNTER — Encounter: Payer: Medicare Other | Admitting: *Deleted

## 2023-04-15 DIAGNOSIS — I5022 Chronic systolic (congestive) heart failure: Secondary | ICD-10-CM | POA: Diagnosis not present

## 2023-04-15 DIAGNOSIS — Z952 Presence of prosthetic heart valve: Secondary | ICD-10-CM

## 2023-04-15 NOTE — Progress Notes (Signed)
Daily Session Note  Patient Details  Name: Melanie Ray MRN: [id number] Date of Birth: 07-20-1951 Referring Provider:   Flowsheet Row Cardiac Rehab from 01/28/2023 in [facility] Cardiac and Pulmonary Rehab  Referring Provider Excell Seltzer       Encounter Date: 04/15/2023  Check In:  Session Check In - 04/15/23 1535       Check-In   Supervising physician immediately available to respond to emergencies See telemetry face sheet for immediately available ER MD    Location [facility]-Cardiac & Pulmonary Rehab    Staff Present Susann Givens, RN BSN;Laureen Manson Passey, BS, RRT, CPFT;Joseph [place], [place]    Virtual Visit No    Medication changes reported     No    Fall or balance concerns reported    No    Warm-up and Cool-down Performed on first and last piece of equipment    Resistance Training Performed Yes    VAD Patient? No    PAD/SET Patient? No      Pain Assessment   Currently in Pain? No/denies                Social History   Tobacco Use  Smoking Status Former   Packs/day: 0.50   Years: 25.00   Additional pack years: 0.00   Total pack years: 12.50   Types: Cigarettes   Quit date: 04/13/1997   Years since quitting: 26.0  Smokeless Tobacco Never    Goals Met:  Independence with exercise equipment Exercise tolerated well No report of concerns or symptoms today Strength training completed today  Goals Unmet:  Not Applicable  Comments: Pt able to follow exercise prescription today without complaint.  Will continue to monitor for progression.    Dr. Bethann Punches is Medical Director for [facility] Cardiac Rehabilitation.  Dr. Vida Rigger is Medical Director for [facility] Pulmonary Rehabilitation.

## 2023-04-16 NOTE — Telephone Encounter (Signed)
Dec '23 during hospitalization Valsartan was stopped and pt was started on Irbesartan which she received from [facility] pharmacy at discharge.  Called [facility] pharmacy to clarify meds, they have never received rx for Irbesartan for pt, she last filled valsartan in 2023, but they have received new refill for valsartan from another provider, med list updated. Pt in recalls for June, message sent to get this sch, will sch at [facility] HF clinic as she lives in [place]

## 2023-04-17 ENCOUNTER — Encounter: Payer: Self-pay | Admitting: *Deleted

## 2023-04-17 DIAGNOSIS — Z952 Presence of prosthetic heart valve: Secondary | ICD-10-CM

## 2023-04-17 NOTE — Progress Notes (Signed)
Cardiac Individual Treatment Plan  Patient Details  Name: Melanie Ray MRN: [id number] Date of Birth: 1951/04/05 Referring Provider:   Flowsheet Row Cardiac Rehab from 01/28/2023 in [facility] Cardiac and Pulmonary Rehab  Referring Provider Excell Seltzer       Initial Encounter Date:  Flowsheet Row Cardiac Rehab from 01/28/2023 in [facility] Cardiac and Pulmonary Rehab  Date 01/28/23       Visit Diagnosis: S/P mitral valve replacement  Patient's Home Medications on Admission:  Current Outpatient Medications:    aspirin EC 81 MG tablet, Take 1 tablet (81 mg total) by mouth daily. Swallow whole., Disp: 30 tablet, Rfl: 12   atorvastatin (LIPITOR) 80 MG tablet, Take 1 tablet (80 mg total) by mouth daily., Disp: 90 tablet, Rfl: 3   carvedilol (COREG) 12.5 MG tablet, Take 0.5 tablets (6.25 mg total) by mouth 2 (two) times daily with a meal., Disp: 60 tablet, Rfl: 1   dorzolamide-timolol (COSOPT) 22.3-6.8 MG/ML ophthalmic solution, Place 1 drop into both eyes 2 (two) times daily. , Disp: , Rfl:    furosemide (LASIX) 40 MG tablet, Take 40 mg by mouth as needed., Disp: , Rfl:    gabapentin (NEURONTIN) 100 MG capsule, Take 100 mg by mouth 2 (two) times daily., Disp: , Rfl:    isosorbide mononitrate (IMDUR) 30 MG 24 hr tablet, Take 30 mg by mouth daily., Disp: , Rfl:    latanoprost (XALATAN) 0.005 % ophthalmic solution, Place 1 drop into both eyes at bedtime., Disp: , Rfl:    memantine (NAMENDA) 5 MG tablet, Take 5 mg by mouth daily., Disp: , Rfl:    spironolactone (ALDACTONE) 25 MG tablet, Take 0.5 tablets (12.5 mg total) by mouth daily., Disp: 45 tablet, Rfl: 3   traZODone (DESYREL) 150 MG tablet, Take 150 mg by mouth at bedtime., Disp: , Rfl:    valsartan (DIOVAN) 80 MG tablet, Take 80 mg by mouth daily., Disp: , Rfl:   Past Medical History: Past Medical History:  Diagnosis Date   ABLA (acute blood loss anemia) 10/24/2022   AICD (automatic cardioverter/defibrillator) present    Allergic rhinitis  09/25/2022   Back pain    Benign neoplasm of colon 04/27/2008   CAD (coronary artery disease) 04/11/2016   S/p ant STEMI 5/17: LHC >> LAD proximal 80%, mid 80%, distal 50%, ostial D1 60%; LCx with LPDA lesion 30%; RCA Mild calcification with no significant stenosis in a medium caliber, nondominant RCA; LVEF is estimated at 45% with inferoapical and lateral wall akinesis >> PCI: PCI: 3.5 x 24 mm Promus DES to prox LAD, 2.5 x 12 mm Promus DES to mid LAD.   Cerebellar stroke 03/30/2020   2021 MRI - Small, old left cerebellar infarct    Chest pain    Chronic HFrEF (heart failure with reduced ejection fraction) 10/25/2022   Chronic systolic CHF (congestive heart failure) 03/21/2016   Echo 01/30/17: Diff HK, mild focal basal septal hypertrophy, EF 30-35, mild AI, MAC, mild MR // Echo 06/08/16: Mild focal basal septal hypertrophy, EF 25-30%, diff HK, ant-septal AK, Gr 1 DD, mild AI, MAC, mild MR, PASP 37 mmHg // Echo 03/18/16: EF 30-35%, ant-septal AK, Gr 1 DD, mild MR, severe LAE.     Constipation    Diarrhea 01/02/2023   Dizziness    Dyspnea    Endotracheal tube present 10/24/2022   Essential hypertension 04/27/2008   Well-controlled today.  She will continue valsartan 80 mg twice a day, Imdur 30 mg daily, Lasix 20 mg every other day,  [name] 10 mg daily, and carvedilol 12.5 mg twice daily.   Finger fracture 08/15/2021   She reports this is well-healing.  She will continue to see orthopedics.   Generalized anxiety disorder 04/27/2008   Glaucoma    Gout    History of acute anterior wall MI 03/17/2016   History of colonoscopy 04/27/2008   History of COVID-19 06/27/2022   HLD (hyperlipidemia) 07/28/2015   Check lipid panel.  She will continue Lipitor.   Hot flash, menopausal 02/07/2018   Will check with our clinical pharmacist regarding the black cohosh and primrose in this patient.   Hypercholesterolemia 04/27/2008   Hyperglycemia 10/26/2022   Hyperlipemia    ICD (implantable  cardioverter-defibrillator) in place 01/02/2022   Insomnia 07/28/2015   Ok to continue melatonin.   Iron deficiency anemia, unspecified 04/27/2008   Ischemic cardiomyopathy 10/02/2016   Joint pain    Left bundle branch block 12/03/2018   Chronic.  She will follow with cardiology.   Major depressive disorder 07/28/2015   Chronic ongoing issue.  Has worsened somewhat recently.  Notes passive SI.  Advised if she develops intent or plan to harm herself she needs to go to the emergency room.  I advised her to contact her psychiatrist to arrange for follow-up and she was willing to do so.  She does have protective factors in place including her husband, dog, and nephew.   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  She will follow with cardiology.  We will send her EKG to her psychiatrist and have CMA contact the psychiatry office to inform them that the EKG was faxed and of the results.   Rash 02/07/2018   Possibly related to folliculitis or some other undetermined cause of her rash.  She will trial over-the-counter antibiotic ointment and if not beneficial she will let us know and we can refer her to dermatology.   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 Excell Seltzer and Dr. 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  She will continue [name] 10 mg daily.   Uterine leiomyoma 04/27/2008   Vitamin D deficiency 09/15/2010    Tobacco Use: Social History   Tobacco Use  Smoking  Status Former   Packs/day: 0.50   Years: 25.00   Additional pack years: 0.00   Total pack years: 12.50   Types: Cigarettes   Quit date: 04/13/1997   Years since quitting: 26.0  Smokeless Tobacco Never    Labs: Review Flowsheet  More data exists      Latest Ref Rng & Units 10/15/2022 10/24/2022 10/25/2022 10/26/2022 03/20/2023  Labs for ITP Cardiac and Pulmonary Rehab  Cholestrol 0 - 200 mg/dL - - - - 161   LDL (calc) 0 - 99 mg/dL - - - - 75   HDL-C >09.60 mg/dL - - - - 45.40   Trlycerides 0.0 - 149.0 mg/dL - - - - 98.1   Hemoglobin A1c 4.6 - 6.5 % - - - - 5.9   PH,  Arterial 7.35 - 7.45 - 7.316  7.349  7.348  7.380  7.437  7.379  7.263  - - -  PCO2 arterial 32 - 48 mmHg - 40.6  38.3  35.2  35.7  32.0  34.0  42.2  - - -  Bicarbonate 20.0 - 28.0 mmol/L 25.4  25.8  20.7  21.1  19.3  21.4  21.6  27.8  20.0  19.1  - - -  TCO2 22 - 32 mmol/L 27  27  22  22  20  23  23  24   49  29  21  20  20  23   - - -  Acid-base deficit 0.0 - 2.0 mmol/L - 5.0  4.0  6.0  4.0  2.0  5.0  7.0  - - -  O2 Saturation % 64  64  98  79  98  98  93  100  81  100  100  70.1  78.2  -     Exercise Target Goals: Exercise Program Goal: Individual exercise prescription set using results from initial 6 min walk test and THRR while considering  patient's activity barriers and safety.   Exercise Prescription Goal: Initial exercise prescription builds to 30-45 minutes a day of aerobic activity, 2-3 days per week.  Home exercise guidelines will be given to patient during program as part of exercise prescription that the participant will acknowledge.   Education: Aerobic Exercise: - Group verbal and visual presentation on the components of exercise prescription. Introduces F.I.T.T principle from ACSM for exercise prescriptions.  Reviews F.I.T.T. principles of aerobic exercise including progression. Written material given at graduation.   Education: Resistance Exercise: - Group verbal and visual presentation on the  components of exercise prescription. Introduces F.I.T.T principle from ACSM for exercise prescriptions  Reviews F.I.T.T. principles of resistance exercise including progression. Written material given at graduation.    Education: Exercise & Equipment Safety: - Individual verbal instruction and demonstration of equipment use and safety with use of the equipment. Flowsheet Row Cardiac Rehab from 01/28/2023 in [facility] Cardiac and Pulmonary Rehab  Date 01/28/23  Educator [facility]  Instruction Review Code 1- Verbalizes Understanding       Education: Exercise Physiology & General Exercise Guidelines: - Group verbal and written instruction with models to review the exercise physiology of the cardiovascular system and associated critical values. Provides general exercise guidelines with specific guidelines to those with heart or lung disease.    Education: Flexibility, Balance, Mind/Body Relaxation: - Group verbal and visual presentation with interactive activity on the components of exercise prescription. Introduces F.I.T.T principle from ACSM for exercise prescriptions. Reviews F.I.T.T. principles of flexibility and balance exercise training including progression. Also discusses the mind body connection.  Reviews various relaxation techniques to help reduce and manage stress (i.e. Deep breathing, progressive muscle relaxation, and visualization). Balance handout provided to take home. Written material given at graduation.   Activity Barriers & Risk Stratification:  Activity Barriers & Cardiac Risk Stratification - 01/28/23 1704       Activity Barriers & Cardiac Risk Stratification   Activity Barriers None    Cardiac Risk Stratification High             6 Minute Walk:  6 Minute Walk     Row Name 01/28/23 1649         6 Minute Walk   Phase Initial     Distance 820 feet     Walk Time 6 minutes     #  of Rest Breaks 0     MPH 1.55     METS 1.87     RPE 11     Perceived Dyspnea  1      VO2 Peak 6.54     Symptoms No     Resting HR 85 bpm     Resting BP 124/72     Resting Oxygen Saturation  100 %     Exercise Oxygen Saturation  during 6 min walk 100 %     Max Ex. HR 91 bpm     Max Ex. BP 112/60     2 Minute Post BP 104/60              Oxygen Initial Assessment:   Oxygen Re-Evaluation:   Oxygen Discharge (Final Oxygen Re-Evaluation):   Initial Exercise Prescription:  Initial Exercise Prescription - 01/28/23 1700       Date of Initial Exercise RX and Referring Provider   Date 01/28/23    Referring Provider Excell Seltzer      Oxygen   Maintain Oxygen Saturation 88% or higher      Treadmill   MPH 1.2    Grade 0    Minutes 15    METs 1.92      Recumbant Bike   Level 1    RPM 50    Watts 15    Minutes 15      NuStep   Level 1    SPM 80    Minutes 15    METs 1.87      T5 Nustep   Level 1    SPM 80    Minutes 15    METs 1.87      Biostep-RELP   Level 1    SPM 50    Minutes 15    METs 1.87      Prescription Details   Frequency (times per week) 2    Duration Progress to 30 minutes of continuous aerobic without signs/symptoms of physical distress      Intensity   THRR 40-80% of Max Heartrate 110-135    Ratings of Perceived Exertion 11-13    Perceived Dyspnea 0-4      Progression   Progression Continue to progress workloads to maintain intensity without signs/symptoms of physical distress.      Resistance Training   Training Prescription Yes    Weight 2    Reps 10-15             Perform Capillary Blood Glucose checks as needed.  Exercise Prescription Changes:   Exercise Prescription Changes     Row Name 01/28/23 1700 02/11/23 1400 02/25/23 1500 03/12/23 0800 03/18/23 1600     Response to Exercise   Blood Pressure (Admit) 124/72 132/62 128/68 130/60 --   Blood Pressure (Exercise) 112/60 136/60 142/62 138/60 --   Blood Pressure (Exit) 104/60 130/64 148/60 124/82 --   Heart Rate (Admit) 85 bpm 71 bpm 71 bpm 68 bpm --    Heart Rate (Exercise) 91 bpm 94 bpm 94 bpm 102 bpm --   Heart Rate (Exit) 86 bpm 82 bpm 63 bpm 79 bpm --   Oxygen Saturation (Admit) 100 % -- -- -- --   Oxygen Saturation (Exercise) 100 % -- -- -- --   Oxygen Saturation (Exit) 100 % -- -- -- --   Rating of Perceived Exertion (Exercise) 11 13 13 13  --   Perceived Dyspnea (Exercise) 1 -- -- -- --   Symptoms none none none none --  Comments 6 MWT results 2nd full day of exercise -- -- --   Duration -- Progress to 30 minutes of  aerobic without signs/symptoms of physical distress Continue with 30 min of aerobic exercise without signs/symptoms of physical distress. Continue with 30 min of aerobic exercise without signs/symptoms of physical distress. --   Intensity -- THRR unchanged THRR unchanged THRR unchanged --     Progression   Progression -- Continue to progress workloads to maintain intensity without signs/symptoms of physical distress. Continue to progress workloads to maintain intensity without signs/symptoms of physical distress. Continue to progress workloads to maintain intensity without signs/symptoms of physical distress. --   Average METs -- 1.72 1.95 2.11 --     Resistance Training   Training Prescription -- Yes Yes Yes --   Weight -- 2 lb 2 lb 2 lb --   Reps -- 10-15 10-15 10-15 --     Interval Training   Interval Training -- No No No --     Treadmill   MPH -- -- 0.7 0.7 --   Grade -- -- 0 0 --   Minutes -- -- 15 15 --   METs -- -- 1.54 1.5 --     NuStep   Level -- 1 2 2  --   Minutes -- 15 15 15  --   METs -- 1.9 2.5 2.6 --     REL-XR   Level -- -- 1 -- --   Minutes -- -- 15 -- --   METs -- -- 1.6 -- --     T5 Nustep   Level -- 1 -- 1 --   Minutes -- 15 -- 15 --   METs -- 1.8 -- 1.7 --     Track   Laps -- 14 22 30  --   Minutes -- 15 15 15  --   METs -- 1.76 2.2 2.63 --     Home Exercise Plan   Plans to continue exercise at -- -- -- -- Home (comment)  walking, possibly looking into [employer]   Frequency --  -- -- -- Add 2 additional days to program exercise sessions.   Initial Home Exercises Provided -- -- -- -- 03/18/23     Oxygen   Maintain Oxygen Saturation -- 88% or higher 88% or higher 88% or higher 88% or higher    Row Name 03/26/23 0800 04/09/23 0800           Response to Exercise   Blood Pressure (Admit) 128/54 102/60      Blood Pressure (Exit) 128/60 122/72      Heart Rate (Admit) 82 bpm 88 bpm      Heart Rate (Exercise) 97 bpm 104 bpm      Heart Rate (Exit) 78 bpm 86 bpm      Rating of Perceived Exertion (Exercise) 13 13      Symptoms none none      Duration Continue with 30 min of aerobic exercise without signs/symptoms of physical distress. Continue with 30 min of aerobic exercise without signs/symptoms of physical distress.      Intensity THRR unchanged THRR unchanged        Progression   Progression Continue to progress workloads to maintain intensity without signs/symptoms of physical distress. Continue to progress workloads to maintain intensity without signs/symptoms of physical distress.      Average METs 1.97 2.66        Resistance Training   Training Prescription Yes Yes      Weight 2 lb  2 lb      Reps 10-15 10-15        Interval Training   Interval Training No No        Treadmill   MPH 0.7 --      Grade 0 --      Minutes 15 --      METs 1.54 --        NuStep   Level -- 2      Minutes -- 15      METs -- 2.7        REL-XR   Level 1 --      Minutes 15 --      METs 2.4 --        Track   Laps -- 30      Minutes -- 15      METs -- 2.63        Home Exercise Plan   Plans to continue exercise at Home (comment)  walking, possibly looking into [facility] (comment)  walking, possibly looking into [employer]      Frequency Add 2 additional days to program exercise sessions. Add 2 additional days to program exercise sessions.      Initial Home Exercises Provided 03/18/23 03/18/23        Oxygen   Maintain Oxygen Saturation 88% or higher 88% or higher                Exercise Comments:   Exercise Comments     Row Name 02/05/23 1007           Exercise Comments First full day of exercise!  Patient was oriented to gym and equipment including functions, settings, policies, and procedures.  Patient's individual exercise prescription and treatment plan were reviewed.  All starting workloads were established based on the results of the 6 minute walk test done at initial orientation visit.  The plan for exercise progression was also introduced and progression will be customized based on patient's performance and goals.                Exercise Goals and Review:   Exercise Goals     Row Name 01/28/23 1715             Exercise Goals   Increase Physical Activity Yes       Intervention Provide advice, education, support and counseling about physical activity/exercise needs.;Develop an individualized exercise prescription for aerobic and resistive training based on initial evaluation findings, risk stratification, comorbidities and participant's personal goals.       Expected Outcomes Short Term: Attend rehab on a regular basis to increase amount of physical activity.;Long Term: Add in home exercise to make exercise part of routine and to increase amount of physical activity.;Long Term: Exercising regularly at least 3-5 days a week.       Increase Strength and Stamina Yes       Intervention Provide advice, education, support and counseling about physical activity/exercise needs.;Develop an individualized exercise prescription for aerobic and resistive training based on initial evaluation findings, risk stratification, comorbidities and participant's personal goals.       Expected Outcomes Short Term: Increase workloads from initial exercise prescription for resistance, speed, and METs.;Short Term: Perform resistance training exercises routinely during rehab and add in resistance training at home;Long Term: Improve cardiorespiratory fitness,  muscular endurance and strength as measured by increased METs and functional capacity ( )       Able to understand and use rate of perceived  exertion (RPE) scale Yes       Intervention Provide education and explanation on how to use RPE scale       Expected Outcomes Short Term: Able to use RPE daily in rehab to express subjective intensity level;Long Term:  Able to use RPE to guide intensity level when exercising independently       Able to understand and use Dyspnea scale Yes       Intervention Provide education and explanation on how to use Dyspnea scale       Expected Outcomes Short Term: Able to use Dyspnea scale daily in rehab to express subjective sense of shortness of breath during exertion;Long Term: Able to use Dyspnea scale to guide intensity level when exercising independently       Knowledge and understanding of Target Heart Rate Range (THRR) Yes       Intervention Provide education and explanation of THRR including how the numbers were predicted and where they are located for reference       Expected Outcomes Short Term: Able to state/look up THRR;Long Term: Able to use THRR to govern intensity when exercising independently;Short Term: Able to use daily as guideline for intensity in rehab       Able to check pulse independently Yes       Intervention Provide education and demonstration on how to check pulse in carotid and radial arteries.;Review the importance of being able to check your own pulse for safety during independent exercise       Expected Outcomes Short Term: Able to explain why pulse checking is important during independent exercise;Long Term: Able to check pulse independently and accurately       Understanding of Exercise Prescription Yes       Intervention Provide education, explanation, and written materials on patient's individual exercise prescription       Expected Outcomes Short Term: Able to explain program exercise prescription;Long Term: Able to explain home  exercise prescription to exercise independently                Exercise Goals Re-Evaluation :  Exercise Goals Re-Evaluation     Row Name 02/05/23 1007 02/11/23 1436 02/25/23 1508 03/12/23 0831 03/18/23 1624     Exercise Goal Re-Evaluation   Exercise Goals Review Increase Physical Activity;Able to understand and use rate of perceived exertion (RPE) scale;Knowledge and understanding of Target Heart Rate Range (THRR);Understanding of Exercise Prescription;Increase Strength and Stamina;Able to check pulse independently Increase Physical Activity;Increase Strength and Stamina;Understanding of Exercise Prescription Increase Physical Activity;Increase Strength and Stamina;Understanding of Exercise Prescription Increase Physical Activity;Increase Strength and Stamina;Understanding of Exercise Prescription Increase Physical Activity;Increase Strength and Stamina;Understanding of Exercise Prescription   Comments Reviewed RPE scale, THR and program prescription with pt today.  Pt voiced understanding and was given a copy of goals to take home. Patient is doing well for the first couple of times she has been at rehab. She was able to get 14 laps on the track and exercise the rest at her initial exercise prescription. She had appropriate RPEs throughout the sessions. We will continue to monitor as she progresses in the program. Floyce is doing well in rehab. She walked up to 22 laps on the track, and also started using the treadmill and did well at a speed of 0.7 mph with no incline. She also was able to improve to level 2 on the T4 nustep and began using the XR at level 1. We will continue to monitor her progress in the  program. Latoyna continues to do well in rehab. She walked up to 30 laps on the track this time around!  She has been consistent at level 1 on the T5 Nustep and could benefit from increasing to level 2. She has not been quite hitting her THR at this time. She has been walking at a 0.7 mph on the  treadmill and seems to benefit more from the track as her speed is increased. Will continue to monitor. Reviewed home exercise with pt today.  Pt plans to walk for exercise. Patient has a treadmill at home. She may also walk outdoors or indoors if the weather is not appropriate. She was encouraged to practice walking on the TM here at rehab to ensure she has a good a balanced feel for it to make sure she is safe with it.  We also discussed the [facility] and she was willing to explore it as an option. She also was encouraged to use household items (like canned food) to help with resistance. Reviewed THR, pulse, RPE, sign and symptoms, pulse oximetery and when to call 911 or MD.  Also discussed weather considerations and indoor options.  Pt voiced understanding.   Expected Outcomes Short: Use RPE daily to regulate intensity.  Long: Follow program prescription in THR. Short: Continue to work at initial exercise prescription Long: Increase overall MET level and stamina Short: Continue to push for more laps on track, and progressively increase treadmill workload. Long: Continue to improve strength and stamina. Short: Slowly increase speed on treadmill, increase laps on track Long: Continue to increase overall MET level and stamina Short: Add on 1 day of exercise at home, check out the [facility] at her own convenience Long: Continue to exercise independently at home    Row Name 03/26/23 0818 04/09/23 0833           Exercise Goal Re-Evaluation   Exercise Goals Review Increase Physical Activity;Increase Strength and Stamina;Understanding of Exercise Prescription Increase Physical Activity;Increase Strength and Stamina;Understanding of Exercise Prescription      Comments Kerryn has only attended rehab once since the last review. During her one session she was able to work at level 1 on the XR and she walked the treadmill at a speed of 0.7 mph with no incline. She also has continued to use 2 lb hand weights for  resistance training. We will continue to monitor her progress in the program. Matoya is doing well in rehab.  Her attendance has been spotty at best averaging around once a week.  We have encouraged her to attend consistently to see more progress.  She is up to 30 laps and level 2 on the NuStep.  We will conitnue to monitor her progress.      Expected Outcomes Short: Return to consistent attendance in rehab. Long: Continue to improve strength and stamina. Short: Attend rehab regularly Long: Continue to improve stamina               Discharge Exercise Prescription (Final Exercise Prescription Changes):  Exercise Prescription Changes - 04/09/23 0800       Response to Exercise   Blood Pressure (Admit) 102/60    Blood Pressure (Exit) 122/72    Heart Rate (Admit) 88 bpm    Heart Rate (Exercise) 104 bpm    Heart Rate (Exit) 86 bpm    Rating of Perceived Exertion (Exercise) 13    Symptoms none    Duration Continue with 30 min of aerobic exercise without signs/symptoms of physical distress.  Intensity THRR unchanged      Progression   Progression Continue to progress workloads to maintain intensity without signs/symptoms of physical distress.    Average METs 2.66      Resistance Training   Training Prescription Yes    Weight 2 lb    Reps 10-15      Interval Training   Interval Training No      NuStep   Level 2    Minutes 15    METs 2.7      Track   Laps 30    Minutes 15    METs 2.63      Home Exercise Plan   Plans to continue exercise at Home (comment)   walking, possibly looking into [employer]   Frequency Add 2 additional days to program exercise sessions.    Initial Home Exercises Provided 03/18/23      Oxygen   Maintain Oxygen Saturation 88% or higher             Nutrition:  Target Goals: Understanding of nutrition guidelines, daily intake of sodium 1500mg , cholesterol 200mg , calories 30% from fat and 7% or less from saturated fats, daily to have 5 or more  servings of fruits and vegetables.  Education: All About Nutrition: -Group instruction provided by verbal, written material, interactive activities, discussions, models, and posters to present general guidelines for heart healthy nutrition including fat, fiber, MyPlate, the role of sodium in heart healthy nutrition, utilization of the nutrition label, and utilization of this knowledge for meal planning. Follow up email sent as well. Written material given at graduation.   Biometrics:  Pre Biometrics - 01/28/23 1716       Pre Biometrics   Height 5\' 3"  (1.6 m)    Weight 132 lb 1.6 oz (59.9 kg)    Waist Circumference 33 inches    Hip Circumference 36.5 inches    Waist to Hip Ratio 0.9 %    BMI (Calculated) 23.41    Single Leg Stand 7.09 seconds              Nutrition Therapy Plan and Nutrition Goals:  Nutrition Therapy & Goals - 01/28/23 1554       Nutrition Therapy   Diet Heart healthy, low Na, T2DM MNT    Drug/Food Interactions Statins/Certain Fruits;Coumadin/Vit K    Protein (specify units) 75g    Fiber 25 grams    Whole Grain Foods 3 servings    Saturated Fats 12 max. grams    Fruits and Vegetables 8 servings/day    Sodium 2 grams      Personal Nutrition Goals   Nutrition Goal ST: compare 3-4 days of eating with list of foods high in vitamin K to see how much you are eating, read/compare food labels, review paperwork LT: manage INR, maintain A1C <7, limit sodium <2g/day, follow MyPlate guidelines    Comments 72 y.o. F admitted to cardiac rehab for chronic systolic CHF. PMHx includes CAD, HLD, anemia, stroke, severe mitral insufficiency, severe mitral regurgitation, T2DM, osteopenia, anxiety/depression. Medications reviewed atorvastatin, jardiance, furosemide, potassium-chloride, trazodone, warfarin. B: [place] sausage and eggs D: chicken or fish or beef with a vegetables. She reports using butter, she will use seasoning salt with her meat. She may also have cold cuts. S:she  reports having a sweet tooth - she will have a cookie or 1/2 cup of ice cream and she may have reeses PB cup. Drinks: water and diet coke (not even a whole bottle per day and sweet  tea (1/4 cup usually). Discussed heart healthy eating and diabetes friendly eating. Annyka would like to work on limiting sodium - reviewed label reading; she feels confident in label reading. Discussed warfarin and vitamin K consistency.      Intervention Plan   Intervention Prescribe, educate and counsel regarding individualized specific dietary modifications aiming towards targeted core components such as weight, hypertension, lipid management, diabetes, heart failure and other comorbidities.;Nutrition handout(s) given to patient.    Expected Outcomes Short Term Goal: Understand basic principles of dietary content, such as calories, fat, sodium, cholesterol and nutrients.;Short Term Goal: A plan has been developed with personal nutrition goals set during dietitian appointment.;Long Term Goal: Adherence to prescribed nutrition plan.             Nutrition Assessments:  MEDIFICTS Score Key: ?70 Need to make dietary changes  40-70 Heart Healthy Diet ? 40 Therapeutic Level Cholesterol Diet  Flowsheet Row Cardiac Rehab from 02/05/2023 in [facility]  Picture Your Plate Total Score on Admission 51      Picture Your Plate Scores: <16 Unhealthy dietary pattern with much room for improvement. 41-50 Dietary pattern unlikely to meet recommendations for good health and room for improvement. 51-60 More healthful dietary pattern, with some room for improvement.  >60 Healthy dietary pattern, although there may be some specific behaviors that could be improved.    Nutrition Goals Re-Evaluation:  Nutrition Goals Re-Evaluation     Row Name 03/18/23 1617 03/27/23 1552 04/08/23 1613         Goals   Current Weight -- -- 136 lb (61.7 kg)     Nutrition Goal ST: compare 3-4 days of eating with list of  foods high in vitamin K to see how much you are eating, read/compare food labels, review paperwork LT: manage INR, maintain A1C <7, limit sodium <2g/day, follow MyPlate guidelines -- Eat more     Comment Chenay states she has not really read or paid attention to her Vitamin K intake. Encouraged to start keeping track of the foods she eats (per RD goals take 3-4 days worth of eating) and advised her to discuss with her MD as our RD is not in her role anymore to review how much she should be having. Staff to review education with patient as well  so patient has a better understanding on importance. Provide a handout. She is reading labels for her sodium intake and staying mindful on what she is eating and how much she is eating Per encounter dated from 4/9- patient is not taking warfarin anymore and therefore does not need to monitor Vitamin K levels.  RN reviewed education and information with patient and confirmed medication list. New goals can be established by new RD for further nutrition goals if needed. Nothings is tasting good for her anymore and she has had a poor appetite since she is taking care of her husband. She is not intrested in meeting with the dietitian.     Expected Outcome Short: Understand Vitamin K education involved with her diet, staff to review more information. Patient to start keeping log of food for next 3-4 days per RD goal Long: Continue to follow guidelines by RD and eat a heart healthy diet Short: Establish new goals by new RD if needed Long: Continue to eat heart healthy diet Short: try to eat more when able. Long: maintain a diet that adheres to her.              Nutrition  Goals Discharge (Final Nutrition Goals Re-Evaluation):  Nutrition Goals Re-Evaluation - 04/08/23 1613       Goals   Current Weight 136 lb (61.7 kg)    Nutrition Goal Eat more    Comment Nothings is tasting good for her anymore and she has had a poor appetite since she is taking care of her husband.  She is not intrested in meeting with the dietitian.    Expected Outcome Short: try to eat more when able. Long: maintain a diet that adheres to her.             Psychosocial: Target Goals: Acknowledge presence or absence of significant depression and/or stress, maximize coping skills, provide positive support system. Participant is able to verbalize types and ability to use techniques and skills needed for reducing stress and depression.   Education: Stress, Anxiety, and Depression - Group verbal and visual presentation to define topics covered.  Reviews how body is impacted by stress, anxiety, and depression.  Also discusses healthy ways to reduce stress and to treat/manage anxiety and depression.  Written material given at graduation.   Education: Sleep Hygiene -Provides group verbal and written instruction about how sleep can affect your health.  Define sleep hygiene, discuss sleep cycles and impact of sleep habits. Review good sleep hygiene tips.    Initial Review & Psychosocial Screening:  Initial Psych Review & Screening - 12/26/22 1345       Initial Review   Current issues with History of Depression;Current Depression;Current Anxiety/Panic      Family Dynamics   Good Support System? Yes    Comments Her depression and anxiety does not stem from any one thing. Quetzali can look to her sister, brother and husband. She is ready to get her health on track and start rehab.      Barriers   Psychosocial barriers to participate in program The patient should benefit from training in stress management and relaxation.      Screening Interventions   Interventions Encouraged to exercise;Provide feedback about the scores to participant;To provide support and resources with identified psychosocial needs    Expected Outcomes Short Term goal: Utilizing psychosocial counselor, staff and physician to assist with identification of specific Stressors or current issues interfering with healing  process. Setting desired goal for each stressor or current issue identified.;Long Term Goal: Stressors or current issues are controlled or eliminated.;Short Term goal: Identification and review with participant of any Quality of Life or Depression concerns found by scoring the questionnaire.;Long Term goal: The participant improves quality of Life and PHQ9 Scores as seen by post scores and/or verbalization of changes             Quality of Life Scores:   Quality of Life - 02/05/23 1016       Quality of Life   Select Quality of Life      Quality of Life Scores   Health/Function Pre 16.5 %    Socioeconomic Pre 29.14 %    Psych/Spiritual Pre 16 %    Family Pre 26 %    GLOBAL Pre 20.57 %            Scores of 19 and below usually indicate a poorer quality of life in these areas.  A difference of  2-3 points is a clinically meaningful difference.  A difference of 2-3 points in the total score of the Quality of Life Index has been associated with significant improvement in overall quality of life, self-image, physical symptoms, and  general health in studies assessing change in quality of life.  PHQ-9: Review Flowsheet  More data exists      04/08/2023 03/20/2023 01/28/2023 01/02/2023 12/28/2022  Depression screen PHQ 2/9  Decreased Interest 3 1 1 3 3   Down, Depressed, Hopeless 3 1 1 3 3   PHQ - 2 Score 6 2 2 6 6   Altered sleeping 1 1 1 3 3   Tired, decreased energy 1 2 1 3 3   Change in appetite 1 0 0 3 0  Feeling bad or failure about yourself  0 1 0 2 3  Trouble concentrating 0 0 0 2 0  Moving slowly or fidgety/restless 0 1 0 2 1  Suicidal thoughts 1 1 0 3 2  PHQ-9 Score 10 8 4 24 18   Difficult doing work/chores Somewhat difficult Somewhat difficult Not difficult at all Somewhat difficult Somewhat difficult   Interpretation of Total Score  Total Score Depression Severity:  1-4 = Minimal depression, 5-9 = Mild depression, 10-14 = Moderate depression, 15-19 = Moderately severe  depression, 20-27 = Severe depression   Psychosocial Evaluation and Intervention:  Psychosocial Evaluation - 12/26/22 1349       Psychosocial Evaluation & Interventions   Interventions Encouraged to exercise with the program and follow exercise prescription;Relaxation education;Stress management education    Comments Her depression and anxiety does not stem from any one thing. Bushra can look to her sister, brother and husband. She is ready to get her health on track and start rehab.    Expected Outcomes Short: Start [facility] to help with mood. Long: Maintain a healthy mental state    Continue Psychosocial Services  Follow up required by staff             Psychosocial Re-Evaluation:  Psychosocial Re-Evaluation     Row Name 03/18/23 1621 04/08/23 1610           Psychosocial Re-Evaluation   Current issues with Current Depression;Current Psychotropic Meds Current Depression;Current Psychotropic Meds      Comments Mikey [employer] states she is doing well mentally. She takes Trazadone before her sleep which helps her. She just came back from a trip from [place]- her sister just got doctor in education. She is taking all of her medications appropriately and states she has no extra stress, anxiety, or depression symptoms at this time. She is enjoying coming to rehab. Encouraged her to reach out should anything change. Reviewed patient health questionnaire (PHQ-9) with patient for follow up. Previously, patients score indicated signs/symptoms of depression.  Reviewed to see if patient is improving symptom wise while in program.  Score declined and patient states that it is because she takes care of her husband who is in a facility and her dog requires alot of attention. She has no plans to hurt herself at this time. She states its stems more from the stress of life and that it would not be there if she were to pass.      Expected Outcomes Short: Continue coming to rehab for mood boost Long: Continue to  maintain positive attitude and utilize exercise for stress management Short: Continue to work toward an improvement in PHQ9 scores by attending [facility] regularly. Long: Continue to improve stress and depression coping skills by talking with staff and attending [facility] regularly and work toward a positive mental state.      Interventions Encouraged to attend Cardiac Rehabilitation for the exercise Encouraged to attend Cardiac Rehabilitation for the exercise      Continue Psychosocial Services  Follow up required by staff Follow up required by staff               Psychosocial Discharge (Final Psychosocial Re-Evaluation):  Psychosocial Re-Evaluation - 04/08/23 1610       Psychosocial Re-Evaluation   Current issues with Current Depression;Current Psychotropic Meds    Comments Reviewed patient health questionnaire (PHQ-9) with patient for follow up. Previously, patients score indicated signs/symptoms of depression.  Reviewed to see if patient is improving symptom wise while in program.  Score declined and patient states that it is because she takes care of her husband who is in a facility and her dog requires alot of attention. She has no plans to hurt herself at this time. She states its stems more from the stress of life and that it would not be there if she were to pass.    Expected Outcomes Short: Continue to work toward an improvement in PHQ9 scores by attending [facility] regularly. Long: Continue to improve stress and depression coping skills by talking with staff and attending [facility] regularly and work toward a positive mental state.    Interventions Encouraged to attend Cardiac Rehabilitation for the exercise    Continue Psychosocial Services  Follow up required by staff             Vocational Rehabilitation: Provide vocational rehab assistance to qualifying candidates.   Vocational Rehab Evaluation &  Intervention:   Education: Education Goals: Education classes will be provided on a variety of topics geared toward better understanding of heart health and risk factor modification. Participant will state understanding/return demonstration of topics presented as noted by education test scores.  Learning Barriers/Preferences:  Learning Barriers/Preferences - 12/26/22 1342       Learning Barriers/Preferences   Learning Barriers None    Learning Preferences None             General Cardiac Education Topics:  AED/CPR: - Group verbal and written instruction with the use of models to demonstrate the basic use of the AED with the basic ABC's of resuscitation.   Anatomy and Cardiac Procedures: - Group verbal and visual presentation and models provide information about basic cardiac anatomy and function. Reviews the testing methods done to diagnose heart disease and the outcomes of the test results. Describes the treatment choices: Medical Management, Angioplasty, or Coronary Bypass Surgery for treating various heart conditions including Myocardial Infarction, Angina, Valve Disease, and Cardiac Arrhythmias.  Written material given at graduation.   Medication Safety: - Group verbal and visual instruction to review commonly prescribed medications for heart and lung disease. Reviews the medication, class of the drug, and side effects. Includes the steps to properly store meds and maintain the prescription regimen.  Written material given at graduation.   Intimacy: - Group verbal instruction through game format to discuss how heart and lung disease can affect sexual intimacy. Written material given at graduation..   Know Your Numbers and Heart Failure: - Group verbal and visual instruction to discuss disease risk factors for cardiac and pulmonary disease and treatment options.  Reviews associated critical values for Overweight/Obesity, Hypertension, Cholesterol, and Diabetes.  Discusses  basics of heart failure: signs/symptoms and treatments.  Introduces Heart Failure Zone chart for action plan for heart failure.  Written material given at graduation.   Infection Prevention: - Provides verbal and written material to individual with discussion of infection control including proper hand washing and proper equipment cleaning during exercise session. Flowsheet Row Cardiac Rehab from 01/28/2023 in [facility] Cardiac and  Pulmonary Rehab  Date 01/28/23  Educator [facility]  Instruction Review Code 1- Verbalizes Understanding       Falls Prevention: - Provides verbal and written material to individual with discussion of falls prevention and safety. Flowsheet Row Cardiac Rehab from 01/28/2023 in [facility] Cardiac and Pulmonary Rehab  Date 01/28/23  Educator [facility]  Instruction Review Code 1- Verbalizes Understanding       Other: -Provides group and verbal instruction on various topics (see comments)   Knowledge Questionnaire Score:  Knowledge Questionnaire Score - 02/05/23 1016       Knowledge Questionnaire Score   Pre Score 22/26             Core Components/Risk Factors/Patient Goals at Admission:  Personal Goals and Risk Factors at Admission - 01/28/23 1716       Core Components/Risk Factors/Patient Goals on Admission    Weight Management Yes;Weight Maintenance    Intervention Weight Management: Develop a combined nutrition and exercise program designed to reach desired caloric intake, while maintaining appropriate intake of nutrient and fiber, sodium and fats, and appropriate energy expenditure required for the weight goal.;Weight Management: Provide education and appropriate resources to help participant work on and attain dietary goals.;Weight Management/Obesity: Establish reasonable short term and long term weight goals.    Admit Weight 132 lb 1.6 oz (59.9 kg)    Goal Weight: Short Term 132 lb (59.9 kg)    Goal Weight: Long Term 132 lb (59.9 kg)    Expected Outcomes Short Term:  Continue to assess and modify interventions until short term weight is achieved;Long Term: Adherence to nutrition and physical activity/exercise program aimed toward attainment of established weight goal;Weight Maintenance: Understanding of the daily nutrition guidelines, which includes 25-35% calories from fat, 7% or less cal from saturated fats, less than 200mg  cholesterol, less than 1.5gm of sodium, & 5 or more servings of fruits and vegetables daily;Understanding recommendations for meals to include 15-35% energy as protein, 25-35% energy from fat, 35-60% energy from carbohydrates, less than 200mg  of dietary cholesterol, 20-35 gm of total fiber daily;Understanding of distribution of calorie intake throughout the day with the consumption of 4-5 meals/snacks    Diabetes Yes    Intervention Provide education about signs/symptoms and action to take for hypo/hyperglycemia.;Provide education about proper nutrition, including hydration, and aerobic/resistive exercise prescription along with prescribed medications to achieve blood glucose in normal ranges: Fasting glucose 65-99 mg/dL    Expected Outcomes Short Term: Participant verbalizes understanding of the signs/symptoms and immediate care of hyper/hypoglycemia, proper foot care and importance of medication, aerobic/resistive exercise and nutrition plan for blood glucose control.;Long Term: Attainment of HbA1C < 7%.    Heart Failure Yes    Intervention Provide a combined exercise and nutrition program that is supplemented with education, support and counseling about heart failure. Directed toward relieving symptoms such as shortness of breath, decreased exercise tolerance, and extremity edema.    Expected Outcomes Improve functional capacity of life;Short term: Attendance in program 2-3 days a week with increased exercise capacity. Reported lower sodium intake. Reported increased fruit and vegetable intake. Reports medication compliance.;Long term: Adoption of  self-care skills and reduction of barriers for early signs and symptoms recognition and intervention leading to self-care maintenance.;Short term: Daily weights obtained and reported for increase. Utilizing diuretic protocols set by physician.    Hypertension Yes    Intervention Provide education on lifestyle modifcations including regular physical activity/exercise, weight management, moderate sodium restriction and increased consumption of fresh fruit, vegetables, and low fat dairy,  alcohol moderation, and smoking cessation.;Monitor prescription use compliance.    Expected Outcomes Short Term: Continued assessment and intervention until BP is < 140/91mm HG in hypertensive participants. < 130/12mm HG in hypertensive participants with diabetes, heart failure or chronic kidney disease.;Long Term: Maintenance of blood pressure at goal levels.    Lipids Yes    Intervention Provide education and support for participant on nutrition & aerobic/resistive exercise along with prescribed medications to achieve LDL 70mg , HDL >40mg .    Expected Outcomes Short Term: Participant states understanding of desired cholesterol values and is compliant with medications prescribed. Participant is following exercise prescription and nutrition guidelines.;Long Term: Cholesterol controlled with medications as prescribed, with individualized exercise RX and with personalized nutrition plan. Value goals: LDL < 70mg , HDL > 40 mg.             Education:Diabetes - Individual verbal and written instruction to review signs/symptoms of diabetes, desired ranges of glucose level fasting, after meals and with exercise. Acknowledge that pre and post exercise glucose checks will be done for 3 sessions at entry of program. Flowsheet Row Cardiac Rehab from 01/28/2023 in [facility] Cardiac and Pul[facility] Rehab  Date 01/28/23  Educator [facility]  Instruction Review Code 1- Verbalizes Understanding       Core Components/Risk Factors/Patient Goals  Review:   Goals and Risk Factor Review     Row Name 03/18/23 1619 04/08/23 1619           Core Components/Risk Factors/Patient Goals Review   Personal Goals Review Weight Management/Obesity;Heart Failure;Diabetes;Hypertension Hypertension      Review Jamelyn states she is maintaining her weight between 130-135 lb. She knows to look for any potential fluid gain with a sudden weight gain. Denies any heart failure symptoms at this time. Does not have a BP cuff but encouraged to get one so she can monitor her BP at home. She is diet controlled for her diabetes and sees PCP this Wednesday, 5/15. and may get A1C checked then. Glorias blood pressure has been doing well. She does not check her blood pressure at home and informed her to get one if she was able to. Explained why it is important to take blood pressure at home while taking medicaions.      Expected Outcomes Short: See PCP on Wednesday. buy BP cuff for at home monitoring Long: Continue to manage lifestyle risk factors Short: obtain a blood pressure cuff. Long: maintain blood pressure readings at home.               Core Components/Risk Factors/Patient Goals at Discharge (Final Review):   Goals and Risk Factor Review - 04/08/23 1619       Core Components/Risk Factors/Patient Goals Review   Personal Goals Review Hypertension    Review Glorias blood pressure has been doing well. She does not check her blood pressure at home and informed her to get one if she was able to. Explained why it is important to take blood pressure at home while taking medicaions.    Expected Outcomes Short: obtain a blood pressure cuff. Long: maintain blood pressure readings at home.             ITP Comments:  ITP Comments     Row Name 12/26/22 1341 01/28/23 1647 02/05/23 1006 02/20/23 1428 03/20/23 1204   ITP Comments Virtual Visit completed. Patient informed on EP and RD appointment and 6 Minute walk test. Patient also informed of patient health  questionnaires on My Chart. Patient Verbalizes understanding. Visit diagnosis  can be found in [facility] 10/24/2022. Completed and gym orientation. Initial ITP created and sent for review to Dr. Daniel Nones, Medical Director. First full day of exercise!  Patient was oriented to gym and equipment including functions, settings, policies, and procedures.  Patient's individual exercise prescription and treatment plan were reviewed.  All starting workloads were established based on the results of the 6 minute walk test done at initial orientation visit.  The plan for exercise progression was also introduced and progression will be customized based on patient's performance and goals. 30 day review completed. ITP sent to Dr. Bethann Punches, Medical Director of Cardiac Rehab. Continue with ITP unless changes are made by physician. 30 Day review completed. Medical Director ITP review done, changes made as directed, and signed approval by Medical Director.    Row Name 03/27/23 1904 04/17/23 1140         ITP Comments Per encounter dated from 4/9- patient is not taking warfarin anymore.  RN reviewed education with patient and confirmed medication list. New goals can be established by new RD for further nutrition goals if needed. 30 Day review completed. Medical Director ITP review done, changes made as directed, and signed approval by Medical Director.               Comments:

## 2023-04-21 NOTE — Progress Notes (Signed)
   SUBJECTIVE:   Chief Complaint  Patient presents with   Acute Visit    Hot flashes x years not getting any better   HPI Presents to clinic for symptoms of hot flashes  Ongoing for years No previous use of HRT.  History of MI, stroke. Currently on gabapentin 300 mg twice daily but reports does not help with hot flashes. Endorses hot flashes day and night, has to change close mostly at night.   Requesting refill for psychiatric meds. Was previously seen NP Jeannine Kitten in [place].  Was told that she needed to see a different psychiatrist as she could no longer manage her anxiety.  Patient was scheduled to see psychiatry in [place] at [facility] but got lost on the way.  She reports having had increased anxiety and does not want repeat exam.  PERTINENT PMH / PSH: Mood disorder  OBJECTIVE:  BP 124/78   Pulse 73   Temp 97.9 F (36.6 C)   Ht 5\' 3"  (1.6 m)   Wt 133 lb 3.2 oz (60.4 kg)   SpO2 99%   BMI 23.60 kg/m    Physical Exam Vitals reviewed.  Constitutional:      General: She is not in acute distress.    Appearance: Normal appearance. She is normal weight. She is not ill-appearing, toxic-appearing or diaphoretic.  Eyes:     General:        Right eye: No discharge.        Left eye: No discharge.     Conjunctiva/sclera: Conjunctivae normal.  Cardiovascular:     Rate and Rhythm: Normal rate and regular rhythm.     Pulses: Normal pulses.     Heart sounds: Normal heart sounds.  Pulmonary:     Effort: Pulmonary effort is normal.     Breath sounds: Normal breath sounds.  Abdominal:     General: Bowel sounds are normal.  Musculoskeletal:        General: Normal range of motion.  Skin:    General: Skin is warm and dry.  Neurological:     General: No focal deficit present.     Mental Status: She is alert and oriented to person, place, and time. Mental status is at baseline.  Psychiatric:        Mood and Affect: Mood normal.        Behavior: Behavior  normal.        Thought Content: Thought content normal.        Judgment: Judgment normal.     ASSESSMENT/PLAN:  Hot flashes Assessment & Plan: Not a candidate for HRT given previous history of MI, stroke and advancing age. Discontinue Prozac Start Paxil 7.5 mg daily Could consider Veozah 45 mg daily if no liver disease Continue gabapentin 300 mg twice daily Follow-up with PCP if no improvement in symptoms.   Mood disorder [facility]) Assessment & Plan: Was approved recently followed by NP Jeannine Kitten who instructed patient to seek medical care with different psychiatric provider. Patient has run out of her psychiatric medications. Refill gabapentin 300 mg twice daily x 60 tabs Refill trazodone 200 mg nightly x 30 tabs Referral sent to psychiatry Patient aware to follow-up with PCP and psychiatry as this provider will not continue to refill medication.    PDMP reviewed  Return in about 2 weeks (around 05/06/2023) for PCP.  Dana Allan, MD

## 2023-04-21 NOTE — Patient Instructions (Addendum)
It was a pleasure meeting you today. Thank you for allowing me to take part in your health care.  Our goals for today as we discussed include:  Wear light clothing Increase fluids to stay hydrated Yoga to help with symptoms Maintain cool temperature in home Change clothing frequently  Continue Gabapentin 300 mg two times a day  Start Paxil 7.5 mg daily  Follow up with PCP in 1-2 weeks   Recommend yearly foot and eye exam  Schedule Medicare Annual Wellness Visit   Recommend Tetanus Vaccination.  This is given every 10 years.      If you have any questions or concerns, please do not hesitate to call the office at [phone].  I look forward to our next visit and until then take care and stay safe.  Regards,   Dana Allan, MD   [facility]

## 2023-04-22 ENCOUNTER — Ambulatory Visit (INDEPENDENT_AMBULATORY_CARE_PROVIDER_SITE_OTHER): Payer: Medicare Other | Admitting: Family Medicine

## 2023-04-22 ENCOUNTER — Encounter: Payer: Self-pay | Admitting: Family Medicine

## 2023-04-22 ENCOUNTER — Encounter: Payer: Medicare Other | Admitting: *Deleted

## 2023-04-22 VITALS — BP 124/78 | HR 73 | Temp 97.9°F | Ht 63.0 in | Wt 133.2 lb

## 2023-04-22 DIAGNOSIS — F39 Unspecified mood [affective] disorder: Secondary | ICD-10-CM | POA: Diagnosis not present

## 2023-04-22 DIAGNOSIS — Z952 Presence of prosthetic heart valve: Secondary | ICD-10-CM

## 2023-04-22 DIAGNOSIS — R232 Flushing: Secondary | ICD-10-CM | POA: Diagnosis not present

## 2023-04-22 DIAGNOSIS — I5022 Chronic systolic (congestive) heart failure: Secondary | ICD-10-CM | POA: Diagnosis not present

## 2023-04-22 DIAGNOSIS — N951 Menopausal and female climacteric states: Secondary | ICD-10-CM

## 2023-04-22 MED ORDER — FLUOXETINE HCL 10 MG PO TABS
5.0000 mg | ORAL_TABLET | Freq: Every day | ORAL | 0 refills | Status: DC
Start: 2023-04-22 — End: 2023-04-23

## 2023-04-22 NOTE — Progress Notes (Signed)
Daily Session Note  Patient Details  Name: Melanie Ray MRN: [id number] Date of Birth: Sep 24, 1951 Referring Provider:   Flowsheet Row Cardiac Rehab from 01/28/2023 in [facility] Cardiac and Pulmonary Rehab  Referring Provider Excell Seltzer       Encounter Date: 04/22/2023  Check In:  Session Check In - 04/22/23 1530       Check-In   Supervising physician immediately available to respond to emergencies See telemetry face sheet for immediately available ER MD    Location [facility]-Cardiac & Pulmonary Rehab    Staff Present Susann Givens, RN BSN;Joseph Reino Kent, RCP,RRT,BSRT;Noah Villanueva, [place], Exercise Physiologist    Virtual Visit No    Medication changes reported     No    Fall or balance concerns reported    No    Warm-up and Cool-down Performed on first and last piece of equipment    Resistance Training Performed Yes    VAD Patient? No    PAD/SET Patient? No      Pain Assessment   Currently in Pain? No/denies                Social History   Tobacco Use  Smoking Status Former   Packs/day: 0.50   Years: 25.00   Additional pack years: 0.00   Total pack years: 12.50   Types: Cigarettes   Quit date: 04/13/1997   Years since quitting: 26.0  Smokeless Tobacco Never    Goals Met:  Independence with exercise equipment Exercise tolerated well No report of concerns or symptoms today Strength training completed today  Goals Unmet:  Not Applicable  Comments: Pt able to follow exercise prescription today without complaint.  Will continue to monitor for progression.    Dr. Bethann Punches is Medical Director for [facility] Cardiac Rehabilitation.  Dr. Vida Rigger is Medical Director for [facility] Pulmonary Rehabilitation.

## 2023-04-23 ENCOUNTER — Telehealth: Payer: Self-pay | Admitting: Family Medicine

## 2023-04-23 ENCOUNTER — Other Ambulatory Visit: Payer: Self-pay | Admitting: Family Medicine

## 2023-04-23 DIAGNOSIS — N951 Menopausal and female climacteric states: Secondary | ICD-10-CM

## 2023-04-23 DIAGNOSIS — F331 Major depressive disorder, recurrent, moderate: Secondary | ICD-10-CM

## 2023-04-23 DIAGNOSIS — G4700 Insomnia, unspecified: Secondary | ICD-10-CM

## 2023-04-23 DIAGNOSIS — F422 Mixed obsessional thoughts and acts: Secondary | ICD-10-CM

## 2023-04-23 DIAGNOSIS — F411 Generalized anxiety disorder: Secondary | ICD-10-CM

## 2023-04-23 MED ORDER — TRAZODONE HCL 100 MG PO TABS
200.0000 mg | ORAL_TABLET | Freq: Every day | ORAL | 0 refills | Status: DC
Start: 2023-04-23 — End: 2023-05-17

## 2023-04-23 MED ORDER — GABAPENTIN 100 MG PO CAPS
300.0000 mg | ORAL_CAPSULE | Freq: Two times a day (BID) | ORAL | 0 refills | Status: DC
Start: 2023-04-23 — End: 2023-05-31

## 2023-04-23 MED ORDER — PAROXETINE MESYLATE 7.5 MG PO CAPS
7.5000 mg | ORAL_CAPSULE | Freq: Every day | ORAL | 0 refills | Status: DC
Start: 2023-04-23 — End: 2023-05-13

## 2023-04-23 NOTE — Progress Notes (Signed)
Referral sent for psychiatry Prozac switched to Paxil 7.5 mg daily for menopausal hot flashes. Refill Gabapentin 300 mg BID x 60 Refill Trazadone 200 mg at night x 60 Patient aware to discontinue prozac, referral and refills Follow up with PCP as scheduled  Dana Allan, MD

## 2023-04-23 NOTE — Telephone Encounter (Signed)
Pt called in looking for an appt with [facility]. She's on for 05/13/23.   However, pt would like a referral for Psy in order to ger her meds. As per pt, she would like to go to [facility] for PSY.

## 2023-04-23 NOTE — Telephone Encounter (Signed)
Pt was seen by Dr Clent Ridges yesterday & was given Prozac. Will route to both for referral assistance.

## 2023-04-23 NOTE — Progress Notes (Signed)
Remote ICD transmission.   

## 2023-04-24 ENCOUNTER — Encounter: Payer: Medicare Other | Admitting: *Deleted

## 2023-04-24 DIAGNOSIS — Z952 Presence of prosthetic heart valve: Secondary | ICD-10-CM

## 2023-04-24 DIAGNOSIS — I5022 Chronic systolic (congestive) heart failure: Secondary | ICD-10-CM | POA: Diagnosis not present

## 2023-04-24 NOTE — Progress Notes (Signed)
Daily Session Note  Patient Details  Name: Melanie Ray MRN: [id number] Date of Birth: [birth year] Referring Provider:   Flowsheet Row Cardiac Rehab from 01/28/2023 in [facility] Cardiac and Pulmonary Rehab  Referring Provider Excell Seltzer       Encounter Date: 04/24/2023  Check In:  Session Check In - 04/24/23 1538       Check-In   Supervising physician immediately available to respond to emergencies See telemetry face sheet for immediately available ER MD    Location [facility]-Cardiac & Pulmonary Rehab    Staff Present Susann Givens, RN BSN;Joseph Reino Kent, RCP,RRT,BSRT;Megan Katrinka Blazing, RN, [place]    Virtual Visit No    Medication changes reported     No    Fall or balance concerns reported    No    Warm-up and Cool-down Performed on first and last piece of equipment    Resistance Training Performed Yes    VAD Patient? No    PAD/SET Patient? No      Pain Assessment   Currently in Pain? No/denies                Social History   Tobacco Use  Smoking Status Former   Packs/day: 0.50   Years: 25.00   Additional pack years: 0.00   Total pack years: 12.50   Types: Cigarettes   Quit date: 04/13/1997   Years since quitting: 26.0  Smokeless Tobacco Never    Goals Met:  Independence with exercise equipment Exercise tolerated well No report of concerns or symptoms today Strength training completed today  Goals Unmet:  Not Applicable  Comments: Pt able to follow exercise prescription today without complaint.  Will continue to monitor for progression.    Dr. Bethann Punches is Medical Director for [facility] Cardiac Rehabilitation.  Dr. Vida Rigger is Medical Director for [facility] Pulmonary Rehabilitation.

## 2023-04-28 ENCOUNTER — Encounter: Payer: Self-pay | Admitting: Family Medicine

## 2023-04-28 DIAGNOSIS — F39 Unspecified mood [affective] disorder: Secondary | ICD-10-CM | POA: Insufficient documentation

## 2023-04-28 NOTE — Assessment & Plan Note (Signed)
Not a candidate for HRT given previous history of MI, stroke and advancing age. Discontinue Prozac Start Paxil 7.5 mg daily Could consider Veozah 45 mg daily if no liver disease Continue gabapentin 300 mg twice daily Follow-up with PCP if no improvement in symptoms.

## 2023-04-28 NOTE — Assessment & Plan Note (Signed)
Was approved recently followed by NP Jeannine Kitten who instructed patient to seek medical care with different psychiatric provider. Patient has run out of her psychiatric medications. Refill gabapentin 300 mg twice daily x 60 tabs Refill trazodone 200 mg nightly x 30 tabs Referral sent to psychiatry Patient aware to follow-up with PCP and psychiatry as this provider will not continue to refill medication.

## 2023-05-01 ENCOUNTER — Encounter: Payer: Medicare Other | Admitting: Psychology

## 2023-05-01 ENCOUNTER — Encounter: Payer: Medicare Other | Admitting: *Deleted

## 2023-05-01 DIAGNOSIS — I5022 Chronic systolic (congestive) heart failure: Secondary | ICD-10-CM | POA: Diagnosis not present

## 2023-05-01 DIAGNOSIS — Z952 Presence of prosthetic heart valve: Secondary | ICD-10-CM

## 2023-05-01 NOTE — Progress Notes (Signed)
Daily Session Note  Patient Details  Name: Melanie Ray MRN: [id number] Date of Birth: [birth year] Referring Provider:   Flowsheet Row Cardiac Rehab from 01/28/2023 in [facility] Cardiac and Pulmonary Rehab  Referring Provider Excell Seltzer       Encounter Date: 05/01/2023  Check In:  Session Check In - 05/01/23 1532       Check-In   Supervising physician immediately available to respond to emergencies See telemetry face sheet for immediately available ER MD    Location [facility]-Cardiac & Pulmonary Rehab    Staff Present Susann Givens, RN Mabeline Caras, BS, ACSM CEP, Exercise Physiologist;Laureen Manson Passey, BS, RRT, CPFT    Virtual Visit No    Medication changes reported     No    Fall or balance concerns reported    No    Warm-up and Cool-down Performed on first and last piece of equipment    Resistance Training Performed Yes    VAD Patient? No    PAD/SET Patient? No      Pain Assessment   Currently in Pain? No/denies                Social History   Tobacco Use  Smoking Status Former   Packs/day: 0.50   Years: 25.00   Additional pack years: 0.00   Total pack years: 12.50   Types: Cigarettes   Quit date: 04/13/1997   Years since quitting: 26.0  Smokeless Tobacco Never    Goals Met:  Independence with exercise equipment Exercise tolerated well No report of concerns or symptoms today Strength training completed today  Goals Unmet:  Not Applicable  Comments: Pt able to follow exercise prescription today without complaint.  Will continue to monitor for progression.    Dr. Bethann Punches is Medical Director for [facility] Cardiac Rehabilitation.  Dr. Vida Rigger is Medical Director for [facility] Pulmonary Rehabilitation.

## 2023-05-06 ENCOUNTER — Encounter: Payer: Medicare Other | Admitting: Family

## 2023-05-06 ENCOUNTER — Encounter: Payer: Medicare Other | Attending: Cardiovascular Disease

## 2023-05-06 ENCOUNTER — Encounter: Payer: Medicare Other | Admitting: Psychology

## 2023-05-06 DIAGNOSIS — I5022 Chronic systolic (congestive) heart failure: Secondary | ICD-10-CM | POA: Insufficient documentation

## 2023-05-06 DIAGNOSIS — Z952 Presence of prosthetic heart valve: Secondary | ICD-10-CM | POA: Insufficient documentation

## 2023-05-13 ENCOUNTER — Encounter: Payer: Medicare Other | Admitting: *Deleted

## 2023-05-13 ENCOUNTER — Encounter: Payer: Self-pay | Admitting: Family Medicine

## 2023-05-13 ENCOUNTER — Ambulatory Visit (INDEPENDENT_AMBULATORY_CARE_PROVIDER_SITE_OTHER): Payer: Medicare Other | Admitting: Family Medicine

## 2023-05-13 ENCOUNTER — Ambulatory Visit: Payer: Medicare Other | Attending: Family | Admitting: Family

## 2023-05-13 ENCOUNTER — Encounter: Payer: Medicare Other | Admitting: Family

## 2023-05-13 ENCOUNTER — Telehealth: Payer: Self-pay | Admitting: Family Medicine

## 2023-05-13 VITALS — BP 138/67 | HR 83 | Ht 63.0 in | Wt 136.0 lb

## 2023-05-13 VITALS — BP 138/80 | HR 95 | Temp 98.7°F | Ht 63.0 in | Wt 135.6 lb

## 2023-05-13 DIAGNOSIS — G4733 Obstructive sleep apnea (adult) (pediatric): Secondary | ICD-10-CM | POA: Insufficient documentation

## 2023-05-13 DIAGNOSIS — R232 Flushing: Secondary | ICD-10-CM | POA: Diagnosis not present

## 2023-05-13 DIAGNOSIS — N951 Menopausal and female climacteric states: Secondary | ICD-10-CM

## 2023-05-13 DIAGNOSIS — I340 Nonrheumatic mitral (valve) insufficiency: Secondary | ICD-10-CM | POA: Insufficient documentation

## 2023-05-13 DIAGNOSIS — F331 Major depressive disorder, recurrent, moderate: Secondary | ICD-10-CM

## 2023-05-13 DIAGNOSIS — I110 Hypertensive heart disease with heart failure: Secondary | ICD-10-CM | POA: Diagnosis not present

## 2023-05-13 DIAGNOSIS — F411 Generalized anxiety disorder: Secondary | ICD-10-CM | POA: Diagnosis not present

## 2023-05-13 DIAGNOSIS — Z952 Presence of prosthetic heart valve: Secondary | ICD-10-CM

## 2023-05-13 DIAGNOSIS — I5022 Chronic systolic (congestive) heart failure: Secondary | ICD-10-CM | POA: Diagnosis not present

## 2023-05-13 DIAGNOSIS — I2510 Atherosclerotic heart disease of native coronary artery without angina pectoris: Secondary | ICD-10-CM | POA: Insufficient documentation

## 2023-05-13 DIAGNOSIS — I447 Left bundle-branch block, unspecified: Secondary | ICD-10-CM | POA: Diagnosis not present

## 2023-05-13 DIAGNOSIS — F422 Mixed obsessional thoughts and acts: Secondary | ICD-10-CM

## 2023-05-13 DIAGNOSIS — I252 Old myocardial infarction: Secondary | ICD-10-CM | POA: Insufficient documentation

## 2023-05-13 DIAGNOSIS — I10 Essential (primary) hypertension: Secondary | ICD-10-CM | POA: Diagnosis not present

## 2023-05-13 NOTE — Telephone Encounter (Signed)
Referral placed.

## 2023-05-13 NOTE — Progress Notes (Deleted)
Advanced Heart Failure Team Note    PCP: Dr. Birdie Sons EP: Dr. Lalla Brothers HF Cardiologist: Dr. Gala Romney  HPI: 72 y.o. female with a hx of CAD s/p anterior STEMI 5/17 with PCI/DES x 2 to LAD, HFrEF/ischemic cardiomyopathy with LVEF at 20-25%, DM2, HTN, HLD, LBBB with CRT-D placement 01/02/22, OSA, and severe mitral regurgitation s/p mTEER with MitraClip 10/04/22.    She underwent successful transcatheter edge-to-edge mitral valve repair 10/04/22 with MitraClip NTW x1 and NT x1 reducing baseline 4+ mitral regurgitation to trace. However. post op echocardiogram 10/05/22. EF 25% with moderate to severe MR felt secondary to SLDA of the second MitraClip placement. Mean gradient at with an average HR at 83bpm. RV normal    TCTS consulted and felt to be a candidate for MVR.   Patient underwent MVR with 29 mm Mosaic valve by Dr. Leafy Ro on 10/24/22. She returned to the OR shortly after for control of large vessel bleeder on right inferior pericardial. Moderate pericardial and pleural effusions were evacuated. She did well post-op and was stable for discharge 10/30/22 after diuresis and titration of GDMT.   Follow up 1/24, anxious with worse NYHA III symptoms. Volume OK. Echo showed mitral valve prosthesis functioning normally, EF 20-25%, grade I DD, RV ok.  Today she returns for HF follow up. She has SOB walking up steps but does OK walking on flat ground, and feels weak and fatigued. She is chronically dizzy.  Breathing worse when she is anxious. Denies palpitations, CP, abnormal bleeding, edema, or PND/Orthopnea. Appetite ok. No fever or chills. Weight at home 131 pounds.  Uses Lasix every other day. She stopped spiro, says she takes too much meds. Lives at home with her husband who has dementia. She has sleep apnea but did not tolerate CPAP.   ROS: All systems negative except as listed in HPI, PMH and Problem List.  SH:  Social History   Socioeconomic History   Marital status: Married     Spouse name: Rochel Moroyoqui   Number of children: 0   Years of education: 16   Highest education level: Bachelor's degree (e.g., BA, AB, BS)  Occupational History   Occupation: Retired    Comment: Runner, broadcasting/film/video  Tobacco Use   Smoking status: Former    Packs/day: 0.50    Years: 25.00    Additional pack years: 0.00    Total pack years: 12.50    Types: Cigarettes    Quit date: 04/13/1997    Years since quitting: 26.0   Smokeless tobacco: Never  Vaping Use   Vaping Use: Never used  Substance and Sexual Activity   Alcohol use: Not Currently   Drug use: No   Sexual activity: Not Currently  Other Topics Concern   Not on file  Social History Narrative   Lives in [place] with spouse.  No children.   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   Left-handed   Lives in a two story home       Social Determinants of Health   Financial Resource Strain: Low Risk  (03/30/2022)   Overall Financial Resource Strain (CARDIA)    Difficulty of Paying Living Expenses: Not hard at all  Food Insecurity: No Food Insecurity (10/17/2022)   Hunger Vital Sign    Worried About Running Out of Food in the Last Year: Never true    Ran Out of Food in the Last Year: Never true  Transportation Needs: No Transportation Needs (10/17/2022)   PRAPARE - Administrator, Civil Service (Medical): No    Lack of Transportation (Non-Medical): No  Physical Activity: Not on file  Stress: No Stress Concern Present (03/30/2022)   [employer] of Occupational Health - Occupational Stress Questionnaire    Feeling of Stress : Not at all  Social Connections: Unknown (03/30/2022)   Social Connection and Isolation Panel [NHANES]    Frequency of Communication with Friends and Family: Not on file    Frequency of Social Gatherings with Friends and Family: Not on file    Attends Religious Services: Not on file    Active Member of Clubs or  Organizations: Not on file    Attends Banker Meetings: Not on file    Marital Status: Married  Intimate Partner Violence: Not At Risk (10/15/2022)   Humiliation, Afraid, Rape, and Kick questionnaire    Fear of Current or Ex-Partner: No    Emotionally Abused: No    Physically Abused: No    Sexually Abused: No    FH:  Family History  Problem Relation Age of Onset   Anxiety disorder Mother    Paranoid behavior Mother    Hypertension Mother    Dementia Mother    High Cholesterol Mother    Diabetes Mother    Hyperlipidemia Mother    Depression Mother    Hypertension Father    High Cholesterol Father    Mood Disorder Sister    Stroke Sister    Anxiety disorder Maternal Aunt    Tuberculosis Paternal Grandfather    Drug abuse Cousin     Past Medical History:  Diagnosis Date   ABLA (acute blood loss anemia) 10/24/2022   AICD (automatic cardioverter/defibrillator) present    Allergic rhinitis 09/25/2022   Back pain    Benign neoplasm of colon 04/27/2008   CAD (coronary artery disease) 04/11/2016   S/p ant STEMI 5/17: LHC >> LAD proximal 80%, mid 80%, distal 50%, ostial D1 60%; LCx with LPDA lesion 30%; RCA Mild calcification with no significant stenosis in a medium caliber, nondominant RCA; LVEF is estimated at 45% with inferoapical and lateral wall akinesis >> PCI: PCI: 3.5 x 24 mm Promus DES to prox LAD, 2.5 x 12 mm Promus DES to mid LAD.   Cerebellar stroke 03/30/2020   2021 MRI - Small, old left cerebellar infarct    Chest pain    Chronic HFrEF (heart failure with reduced ejection fraction) 10/25/2022   Chronic systolic CHF (congestive heart failure) 03/21/2016   Echo 01/30/17: Diff HK, mild focal basal septal hypertrophy, EF 30-35, mild AI, MAC, mild MR // Echo 06/08/16: Mild focal basal septal hypertrophy, EF 25-30%, diff HK, ant-septal AK, Gr 1 DD, mild AI, MAC, mild MR, PASP 37 mmHg // Echo 03/18/16: EF 30-35%, ant-septal AK, Gr 1 DD, mild MR, severe LAE.      Constipation    Diarrhea 01/02/2023   Dizziness    Dyspnea    Endotracheal tube present 10/24/2022   Essential hypertension 04/27/2008   Well-controlled today.  She will continue valsartan 80 mg twice a day, Imdur 30 mg daily, Lasix 20 mg every other day, [name] 10 mg daily, and carvedilol 12.5 mg twice daily.   Finger fracture 08/15/2021   She reports this is well-healing.  She will continue to see orthopedics.   Generalized anxiety disorder 04/27/2008   Glaucoma    Gout    History of acute anterior wall MI 03/17/2016  History of colonoscopy 04/27/2008   History of COVID-19 06/27/2022   HLD (hyperlipidemia) 07/28/2015   Check lipid panel.  She will continue Lipitor.   Hot flash, menopausal 02/07/2018   Will check with our clinical pharmacist regarding the black cohosh and primrose in this patient.   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   Iron deficiency anemia, unspecified 04/27/2008   Ischemic cardiomyopathy 10/02/2016   Joint pain    Left bundle branch block 12/03/2018   Chronic.  She will follow with cardiology.   Major depressive disorder 07/28/2015   Chronic ongoing issue.  Has worsened somewhat recently.  Notes passive SI.  Advised if she develops intent or plan to harm herself she needs to go to the emergency room.  I advised her to contact her psychiatrist to arrange for follow-up and she was willing to do so.  She does have protective factors in place including her husband, dog, and nephew.   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  She will follow with cardiology.  We will send  her EKG to her psychiatrist and have CMA contact the psychiatry office to inform them that the EKG was faxed and of the results.   Rash 02/07/2018   Possibly related to folliculitis or some other undetermined cause of her rash.  She will trial over-the-counter antibiotic ointment and if not beneficial she will let us know and we can refer her to dermatology.   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 Excell Seltzer and Dr. 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  She will continue [name] 10 mg daily.   Uterine leiomyoma 04/27/2008   Vitamin D deficiency 09/15/2010    Current Outpatient Medications  Medication Sig Dispense Refill   aspirin EC 81 MG tablet Take 1 tablet (81 mg total) by mouth daily. Swallow whole. 30 tablet 12   atorvastatin (LIPITOR) 80 MG tablet Take 1 tablet (80 mg total) by mouth daily. 90 tablet 3   carvedilol (COREG) 12.5 MG tablet Take 0.5 tablets (6.25 mg total) by mouth 2 (two) times daily with a meal. 60 tablet 1   dorzolamide-timolol (COSOPT) 22.3-6.8 MG/ML ophthalmic solution Place 1 drop into both eyes 2 (two) times daily.      furosemide (LASIX) 40 MG tablet Take 40 mg by mouth as needed.     gabapentin (NEURONTIN) 100 MG capsule Take 3 capsules (300 mg total) by mouth 2 (two) times daily. 60 capsule 0   isosorbide mononitrate (IMDUR) 30 MG 24 hr tablet Take 30 mg by mouth daily.     latanoprost (XALATAN) 0.005 % ophthalmic solution Place 1 drop into both eyes at bedtime.     memantine (NAMENDA) 5 MG tablet Take 5 mg by mouth daily.     PARoxetine Mesylate 7.5 MG CAPS Take 7.5 mg by mouth daily. 30 capsule 0  spironolactone (ALDACTONE) 25 MG tablet Take 0.5 tablets (12.5 mg total)  by mouth daily. 45 tablet 3   traZODone (DESYREL) 100 MG tablet Take 2 tablets (200 mg total) by mouth at bedtime. 60 tablet 0   valsartan (DIOVAN) 80 MG tablet Take 80 mg by mouth daily.     No current facility-administered medications for this visit.   There were no vitals taken for this visit.  Wt Readings from Last 3 Encounters:  04/22/23 133 lb 3.2 oz (60.4 kg)  03/20/23 134 lb 9.6 oz (61.1 kg)  01/28/23 132 lb 1.6 oz (59.9 kg)   PHYSICAL EXAM: General:  NAD. No resp difficulty, walked into clinic HEENT: Normal Neck: Supple. No JVD. Carotids 2+ bilat; no bruits. No lymphadenopathy or thryomegaly appreciated. Cor: PMI nondisplaced. Regular rate & rhythm. No rubs, gallops or murmurs. Lungs: Clear Abdomen: Soft, nontender, nondistended. No hepatosplenomegaly. No bruits or masses. Good bowel sounds. Extremities: No cyanosis, clubbing, rash, edema Neuro: Alert & oriented x 3, cranial nerves grossly intact. Moves all 4 extremities w/o difficulty. Affect pleasant.  REDs: 34%  ECG (personally reviewed): AV, BiV paced 77 bpm, QRS 150 msec   ASSESSMENT & PLAN:   1. Mitral regurgitation, severe s/p bioprosthetics MVR  - s/p mTEER with 2 clips on 10/04/22 - Post op echo EF 25% with moderate to severe MR felt secondary to SLDA of the second MitraClip placement. - S/p MVR 12/23 with 29 mm Mosaic valve by Dr. Leafy Ro. Returned to OR  for control of bleeding.  - On Warfarin, INR followed by [facility] - Echo (1/24): mitral valve prosthesis functioning normally, stable. EF 20-25%, mild to moderate mitral stenosis, mean valve gradient 6.0 mmHg   2. Chronic systolic HF - initially felt due to ischemic CM. S/p Anterior STEMI 5/17. Now suspect MR major contributing factor. Has LBBB. EF did not improve despite CRT-D - Echo (8/19): EF 30-35%. - cMRI 1/23 EF 15% No LGE. RV ok. Severe LV dyssynchrony Moderate MR  - CRT-D placed 01/02/22 with LBBB lead by Dr. Lalla Brothers - Echo 07/17/22 EF 20-25%  G2DD, RV ok. Severe central MR  - s/p mTEER with 2 clips on 10/04/22 - Echo 10/05/22: EF 25% with moderate to severe MR felt secondary to SLDA of the second MitraClip placement.  - S/p MVR 10/24/22 - Echo (1/24): EF 20-25%, grade I DD, RV ok. Mitral Valve prosthesis ok - NYHA III, worse recently. volume status looks ok, ReDS 34% - Restart spiro 12.5 mg daily. - Continue Coreg 6.25 mg bid - Continue avapro 37.5 mg daily - Continue [name] 10 mg daily - Continue Lasix PRN, take 20 KCL when taking Lasix. - Check labs. - Repeat echo in 4-6 weeks, to see if there is EF improvement post MVR. - She does not appear low output, but with worsening symptoms, we discussed repeating RHC sooner rather than later. She understands, will hold off for now.  3. Coronary artery disease involving native coronary artery of native heart without angina pectoris - Hx of anterior STEMI in 5/17 tx with DES x 2 to the LAD.   - No LGE on cMRI (1/23) - LHC 9/23 patent LAD stents - No chest pain - Continue ASA + statin.  4. LBBB - s/p CRT-D 3/23 with Dr. Lalla Brothers  - EF has not improved. - QRS 150 msec on ECG today.   5. Orthostatic hypotension - Improved.  6. OSA - Unable to tolerate CPAP - Refer to Dr. Mayford Knife to discuss options.  7. Fatigue - Likely multifactorial - Recent TSH ok - Check iron panel  Follow up in 3 months with Dr. Gala Romney.  Delma Freeze, FNP  8:30 AM

## 2023-05-13 NOTE — Patient Instructions (Signed)
   It was a pleasure to meet you today.  

## 2023-05-13 NOTE — Telephone Encounter (Signed)
Spoke to Patient to let her know about the [facility] Referral.

## 2023-05-13 NOTE — Telephone Encounter (Signed)
Patient is requesting a referral to [facility], located in [place].

## 2023-05-13 NOTE — Progress Notes (Signed)
Daily Session Note  Patient Details  Name: Melanie Ray MRN: [id number] Date of Birth: 1951-06-24 Referring Provider:   Flowsheet Row Cardiac Rehab from 01/28/2023 in [facility] Cardiac [facility] Pul[facility] Rehab  Referring Provider Excell Seltzer       Encounter Date: 05/13/2023  Check In:  Session Check In - 05/13/23 1540       Check-In   Supervising physician immediately available to respond to emergencies See telemetry face sheet for immediately available ER MD    Location [facility]-Cardiac & Pulmonary Rehab    Staff Present Susann Givens, RN BSN;Susanne Bice, RN, BSN, CCRP;Laureen Manson Passey, BS, RRT, CPFT    Virtual Visit No    Medication changes reported     No    Fall or balance concerns reported    No    Warm-up and Cool-down Performed on first and last piece of equipment    Resistance Training Performed Yes    VAD Patient? No    PAD/SET Patient? No      Pain Assessment   Currently in Pain? No/denies                Social History   Tobacco Use  Smoking Status Former   Packs/day: 0.50   Years: 25.00   Additional pack years: 0.00   Total pack years: 12.50   Types: Cigarettes   Quit date: 04/13/1997   Years since quitting: 26.0  Smokeless Tobacco Never    Goals Met:  Independence with exercise equipment Exercise tolerated well No report of concerns or symptoms today Strength training completed today  Goals Unmet:  Not Applicable  Comments: Pt able to follow exercise prescription today without complaint.  Will continue to monitor for progression.    Dr. Bethann Punches is Medical Director for [facility] Cardiac Rehabilitation.  Dr. Vida Rigger is Medical Director for [facility] Pulmonary Rehabilitation.

## 2023-05-13 NOTE — Progress Notes (Unsigned)
Advanced Heart Failure Team Note    PCP: Marikay Alar, MD (last seen 05/24 Cardiologist: Tonny Bollman, MD (last seen 03/24) HF Cardiologist: Arvilla Meres, MD (last seen 03/24)  HPI: Melanie Ray is a 72 y.o. female with a hx of CAD s/p anterior STEMI 5/17 with PCI/DES x 2 to LAD, HFrEF/ischemic cardiomyopathy with LVEF at 20-25%, DM2, stroke, HTN, HLD, anxiety, anemia, depression, OCD, LBBB with CRT-D placement 01/02/22, OSA, and severe mitral regurgitation s/p mTEER with MitraClip 10/04/22.    She underwent successful transcatheter edge-to-edge mitral valve repair 10/04/22 with MitraClip NTW x1 and NT x1 reducing baseline 4+ mitral regurgitation to trace. However. post op echocardiogram 10/05/22. EF 25% with moderate to severe MR felt secondary to SLDA of the second MitraClip placement. Mean gradient at with an average HR at 83bpm. RV normal    TCTS consulted and felt to be a candidate for MVR.Patient underwent MVR with 29 mm Mosaic valve by Dr. Leafy Ro on 10/24/22. She returned to the OR shortly after for control of large vessel bleeder on right inferior pericardial. Moderate pericardial and pleural effusions were evacuated. She did well post-op and was stable for discharge 10/30/22 after diuresis and titration of GDMT.   Follow up 1/24, anxious with worse NYHA III symptoms. Volume OK. Echo showed mitral valve prosthesis functioning normally, EF 20-25%, grade I DD, RV ok.  Echo 02/25/23: EF 30-35% along with mild LVH, mild/ moderate AR and normal functions mitral valve prosthesis.   She presents today for a HF f/u visit with a chief complaint of minimal fatigue with moderate exertion. Chronic in nature. Has associated mild cough, dizziness, slight edema around ankles and chronic difficulty sleeping. Denies any shortness of breath (even when walking up steps), chest pain, palpitations, abdominal distention or weight gain. Participating in cardiac rehab and is enjoying that.  Unable to tolerate CPAP for her sleep apnea.   ROS: All systems negative except as listed in HPI, PMH and Problem List.  SH:  Social History   Socioeconomic History   Marital status: Married    Spouse name: Melanie Ray   Number of children: 0   Years of education: 16   Highest education level: Bachelor's degree (e.g., BA, AB, BS)  Occupational History   Occupation: Retired    Comment: Runner, broadcasting/film/video  Tobacco Use   Smoking status: Former    Packs/day: 0.50    Years: 25.00    Additional pack years: 0.00    Total pack years: 12.50    Types: Cigarettes    Quit date: 04/13/1997    Years since quitting: 26.0   Smokeless tobacco: Never  Vaping Use   Vaping Use: Never used  Substance and Sexual Activity   Alcohol use: Not Currently   Drug use: No   Sexual activity: Not Currently  Other Topics Concern   Not on file  Social History Narrative   Lives in [place] with spouse.  No children.   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   Left-handed   Lives in a two story home       Social Determinants of Health   Financial Resource Strain: Low Risk  (03/30/2022)   Overall Financial Resource Strain (CARDIA)    Difficulty of Paying Living Expenses: Not hard at all  Food Insecurity: No Food Insecurity (10/17/2022)   Hunger Vital Sign    Worried About Running Out of Food in the  Last Year: Never true    Ran Out of Food in the Last Year: Never true  Transportation Needs: No Transportation Needs (10/17/2022)   PRAPARE - Administrator, Civil Service (Medical): No    Lack of Transportation (Non-Medical): No  Physical Activity: Not on file  Stress: No Stress Concern Present (03/30/2022)   [employer] of Occupational Health - Occupational Stress Questionnaire    Feeling of Stress : Not at all  Social Connections: Unknown (03/30/2022)   Social Connection and Isolation Panel [NHANES]    Frequency of  Communication with Friends and Family: Not on file    Frequency of Social Gatherings with Friends and Family: Not on file    Attends Religious Services: Not on file    Active Member of Clubs or Organizations: Not on file    Attends Banker Meetings: Not on file    Marital Status: Married  Intimate Partner Violence: Not At Risk (10/15/2022)   Humiliation, Afraid, Rape, and Kick questionnaire    Fear of Current or Ex-Partner: No    Emotionally Abused: No    Physically Abused: No    Sexually Abused: No    FH:  Family History  Problem Relation Age of Onset   Anxiety disorder Mother    Paranoid behavior Mother    Hypertension Mother    Dementia Mother    High Cholesterol Mother    Diabetes Mother    Hyperlipidemia Mother    Depression Mother    Hypertension Father    High Cholesterol Father    Mood Disorder Sister    Stroke Sister    Anxiety disorder Maternal Aunt    Tuberculosis Paternal Grandfather    Drug abuse Cousin     Past Medical History:  Diagnosis Date   ABLA (acute blood loss anemia) 10/24/2022   AICD (automatic cardioverter/defibrillator) present    Allergic rhinitis 09/25/2022   Back pain    Benign neoplasm of colon 04/27/2008   CAD (coronary artery disease) 04/11/2016   S/p ant STEMI 5/17: LHC >> LAD proximal 80%, mid 80%, distal 50%, ostial D1 60%; LCx with LPDA lesion 30%; RCA Mild calcification with no significant stenosis in a medium caliber, nondominant RCA; LVEF is estimated at 45% with inferoapical and lateral wall akinesis >> PCI: PCI: 3.5 x 24 mm Promus DES to prox LAD, 2.5 x 12 mm Promus DES to mid LAD.   Cerebellar stroke 03/30/2020   2021 MRI - Small, old left cerebellar infarct    Chest pain    Chronic HFrEF (heart failure with reduced ejection fraction) 10/25/2022   Chronic systolic CHF (congestive heart failure) 03/21/2016   Echo 01/30/17: Diff HK, mild focal basal septal hypertrophy, EF 30-35, mild AI, MAC, mild MR // Echo 06/08/16:  Mild focal basal septal hypertrophy, EF 25-30%, diff HK, ant-septal AK, Gr 1 DD, mild AI, MAC, mild MR, PASP 37 mmHg // Echo 03/18/16: EF 30-35%, ant-septal AK, Gr 1 DD, mild MR, severe LAE.     Constipation    Diarrhea 01/02/2023   Dizziness    Dyspnea    Endotracheal tube present 10/24/2022   Essential hypertension 04/27/2008   Well-controlled today.  She will continue valsartan 80 mg twice a day, Imdur 30 mg daily, Lasix 20 mg every other day, [name] 10 mg daily, and carvedilol 12.5 mg twice daily.   Finger fracture 08/15/2021   She reports this is well-healing.  She will continue to see orthopedics.   Generalized anxiety disorder  04/27/2008   Glaucoma    Gout    History of acute anterior wall MI 03/17/2016   History of colonoscopy 04/27/2008   History of COVID-19 06/27/2022   HLD (hyperlipidemia) 07/28/2015   Check lipid panel.  She will continue Lipitor.   Hot flash, menopausal 02/07/2018   Will check with our clinical pharmacist regarding the black cohosh and primrose in this patient.   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   Iron deficiency anemia, unspecified 04/27/2008   Ischemic cardiomyopathy 10/02/2016   Joint pain    Left bundle branch block 12/03/2018   Chronic.  She will follow with cardiology.   Major depressive disorder 07/28/2015   Chronic ongoing issue.  Has worsened somewhat recently.  Notes passive SI.  Advised if she develops intent or plan to harm herself she needs to go to the emergency room.  I advised her to contact her psychiatrist to arrange for follow-up and she was willing to do so.  She does have protective factors in place including her husband, dog, and nephew.   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  She will follow with cardiology.  We will send her EKG to her psychiatrist and have CMA contact the psychiatry office to inform them that the EKG was faxed and of the results.   Rash 02/07/2018   Possibly related to folliculitis or some other undetermined cause of her rash.  She will trial over-the-counter antibiotic ointment and if not beneficial she will let us know and we can refer her to dermatology.   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 Excell Seltzer and Dr. 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  She will continue [name] 10 mg daily.   Uterine leiomyoma 04/27/2008   Vitamin D deficiency 09/15/2010    Current Outpatient Medications  Medication Sig Dispense Refill   aspirin EC 81 MG tablet Take 1 tablet (81 mg total) by mouth daily. Swallow whole. 30 tablet 12   atorvastatin (LIPITOR) 80 MG tablet Take 1 tablet (80 mg total) by mouth daily. 90 tablet 3   carvedilol (COREG) 12.5 MG tablet Take 0.5 tablets (6.25 mg total) by mouth 2 (two) times daily with a meal. 60 tablet 1   dorzolamide-timolol (COSOPT) 22.3-6.8 MG/ML ophthalmic solution Place 1 drop into both eyes 2 (two) times daily.      furosemide (LASIX) 40 MG tablet Take 40 mg by mouth as needed.     gabapentin (NEURONTIN) 300 MG capsule Take 300 mg by mouth 2 (two) times daily. 3 tabs twice daily     isosorbide mononitrate (IMDUR) 30 MG 24 hr tablet Take 30 mg by mouth daily.     latanoprost (XALATAN) 0.005 % ophthalmic solution Place 1 drop into both eyes at bedtime.     spironolactone  (ALDACTONE) 25 MG tablet Take 0.5 tablets (12.5 mg  total) by mouth daily. 45 tablet 3   traZODone (DESYREL) 100 MG tablet Take 2 tablets (200 mg total) by mouth at bedtime. 60 tablet 0   valsartan (DIOVAN) 80 MG tablet Take 80 mg by mouth daily.     gabapentin (NEURONTIN) 100 MG capsule Take 3 capsules (300 mg total) by mouth 2 (two) times daily. (Patient not taking: Reported on 05/13/2023) 60 capsule 0   memantine (NAMENDA) 5 MG tablet Take 5 mg by mouth daily. (Patient not taking: Reported on 05/13/2023)     PARoxetine Mesylate 7.5 MG CAPS Take 7.5 mg by mouth daily. 30 capsule 0   No current facility-administered medications for this visit.   Vitals:   05/13/23 1123  BP: 138/67  Pulse: 83  SpO2: 98%  Weight: 136 lb (61.7 kg)  Height: 5\' 3"  (1.6 m)   Wt Readings from Last 3 Encounters:  05/13/23 135 lb 9.6 oz (61.5 kg)  05/13/23 136 lb (61.7 kg)  04/22/23 133 lb 3.2 oz (60.4 kg)   Lab Results  Component Value Date   CREATININE 0.84 03/20/2023   CREATININE 1.00 01/07/2023   CREATININE 0.96 01/02/2023   PHYSICAL EXAM: General:  NAD. No resp difficulty, walked into clinic HEENT: Normal Neck: Supple. No JVD. No lymphadenopathy or thryomegaly appreciated. Cor: PMI nondisplaced. Regular rate & rhythm. No rubs, gallops or murmurs. Lungs: Clear Abdomen: Soft, nontender, nondistended. No hepatosplenomegaly. No bruits or masses.  Extremities: No cyanosis, clubbing, rash, edema Neuro: Alert & oriented x 3, cranial nerves grossly intact. Moves all 4 extremities w/o difficulty. Affect pleasant.   ECG: not done  ASSESSMENT & PLAN:  1. Chronic systolic HF- - initially felt due to ischemic CM. S/p Anterior STEMI 5/17. Now suspect MR major contributing factor. Has LBBB.  - NYHA II - euvolemic today - weight up 5 pounds since last visit 4 months ago - Echo (8/19): EF 30-35%. - Echo 07/17/22 EF 20-25% G2DD, RV ok. Severe central MR  - Echo 10/05/22: EF 25% with moderate to severe MR felt  secondary to SLDA of the second MitraClip placement.  - Echo (1/24): EF 20-25%, grade I DD, RV ok. Mitral Valve prosthesis ok - Echo 02/25/23: EF 30-35% along with mild LVH, mild/ moderate AR and normal functions mitral valve prosthesis. - cMRI 1/23 EF 15% No LGE. RV ok. Severe LV dyssynchrony Moderate MR  - continue spiro 12.5 mg daily. - Continue Coreg 6.25 mg bid - Continue valsartan 80 mg daily; had SOB w/ entresto - Continue Lasix PRN, take 20 KCL when taking Lasix. - was taking jardiance but it's no longer on her list and she is unsure; she will check once she returns home - possible RHC in the future if symptoms worsen - BNP 01/07/23 was 1367.3  2. Mitral regurgitation, severe s/p bioprosthetics MVR  - s/p mTEER with 2 clips on 10/04/22 - Post op echo EF 25% with moderate to severe MR felt secondary to SLDA of the second MitraClip placement. - S/p MVR 12/23 with 29 mm Mosaic valve by Dr. Leafy Ro. Returned to OR  for control of bleeding.  - On Warfarin, INR followed by [facility]   3. Coronary artery disease involving native coronary artery of native heart without angina pectoris - Hx of anterior STEMI in 5/17 tx with DES x 2 to the LAD.   - No LGE on cMRI (1/23) - LHC 9/23 patent LAD stents - No chest pain - saw cardiology Asa Lente) 03/24 - Continue ASA + statin. -  participating in cardiac rehab  4. LBBB - s/p CRT-D 3/23 with Dr. Lalla Brothers  - EF improving - saw EP Lalla Brothers) 03/24   5. HTN- - BP 138/67 - saw PCP Birdie Sons) 05/24; returns later today - BMP 03/20/23 showed sodium 140, potassium 3.8, creatinine 0.84 & GFR 69.20 - will message PCP and ask him to repeat BMP today as she can get it drawn in his office  6. OSA - Unable to tolerate CPAP  Return in 2 months, sooner if needed.

## 2023-05-13 NOTE — Patient Instructions (Signed)
Nice to see you. Please take trazodone 100 mg nightly for 1 week and then take 50 mg nightly for 1 week and then you can discontinue trazodone. I will check with our clinical pharmacist to make sure that the Remeron is okay for you to take.  If it is not we will come up with another option for you.

## 2023-05-14 ENCOUNTER — Encounter: Payer: Self-pay | Admitting: Family

## 2023-05-14 ENCOUNTER — Encounter: Payer: Self-pay | Admitting: *Deleted

## 2023-05-14 ENCOUNTER — Telehealth: Payer: Self-pay

## 2023-05-14 DIAGNOSIS — Z952 Presence of prosthetic heart valve: Secondary | ICD-10-CM

## 2023-05-14 NOTE — Telephone Encounter (Signed)
Called and spoke with patient to confirm whether she is or is not taking [name] and Warfarin. Patient denies taking either medication.

## 2023-05-14 NOTE — Telephone Encounter (Addendum)
She is > 3 months out from her bioprosthetic MVR so per Dr. Excell Seltzer, ok to be on just ASA 81mg  daily.

## 2023-05-14 NOTE — Progress Notes (Signed)
Cardiac Individual Treatment Plan  Patient Details  Name: Melanie Ray MRN: [id number] Date of Birth: May 12, 1951 Referring Provider:   Flowsheet Row Cardiac Rehab from 01/28/2023 in [facility] Cardiac and Pulmonary Rehab  Referring Provider Excell Seltzer       Initial Encounter Date:  Flowsheet Row Cardiac Rehab from 01/28/2023 in [facility] Cardiac and Pulmonary Rehab  Date 01/28/23       Visit Diagnosis: S/P mitral valve replacement  Patient's Home Medications on Admission:  Current Outpatient Medications:    aspirin EC 81 MG tablet, Take 1 tablet (81 mg total) by mouth daily. Swallow whole., Disp: 30 tablet, Rfl: 12   atorvastatin (LIPITOR) 80 MG tablet, Take 1 tablet (80 mg total) by mouth daily., Disp: 90 tablet, Rfl: 3   carvedilol (COREG) 12.5 MG tablet, Take 0.5 tablets (6.25 mg total) by mouth 2 (two) times daily with a meal., Disp: 60 tablet, Rfl: 1   dorzolamide-timolol (COSOPT) 22.3-6.8 MG/ML ophthalmic solution, Place 1 drop into both eyes 2 (two) times daily. , Disp: , Rfl:    furosemide (LASIX) 40 MG tablet, Take 40 mg by mouth as needed., Disp: , Rfl:    gabapentin (NEURONTIN) 100 MG capsule, Take 3 capsules (300 mg total) by mouth 2 (two) times daily., Disp: 60 capsule, Rfl: 0   gabapentin (NEURONTIN) 300 MG capsule, Take 300 mg by mouth 2 (two) times daily. 3 tabs twice daily, Disp: , Rfl:    isosorbide mononitrate (IMDUR) 30 MG 24 hr tablet, Take 30 mg by mouth daily., Disp: , Rfl:    latanoprost (XALATAN) 0.005 % ophthalmic solution, Place 1 drop into both eyes at bedtime., Disp: , Rfl:    memantine (NAMENDA) 5 MG tablet, Take 5 mg by mouth daily., Disp: , Rfl:    spironolactone (ALDACTONE) 25 MG tablet, Take 0.5 tablets (12.5 mg total) by mouth daily., Disp: 45 tablet, Rfl: 3   traZODone (DESYREL) 100 MG tablet, Take 2 tablets (200 mg total) by mouth at bedtime., Disp: 60 tablet, Rfl: 0   valsartan (DIOVAN) 80 MG tablet, Take 80 mg by mouth daily., Disp: , Rfl:   Past  Medical History: Past Medical History:  Diagnosis Date   ABLA (acute blood loss anemia) 10/24/2022   AICD (automatic cardioverter/defibrillator) present    Allergic rhinitis 09/25/2022   Back pain    Benign neoplasm of colon 04/27/2008   CAD (coronary artery disease) 04/11/2016   S/p ant STEMI 5/17: LHC >> LAD proximal 80%, mid 80%, distal 50%, ostial D1 60%; LCx with LPDA lesion 30%; RCA Mild calcification with no significant stenosis in a medium caliber, nondominant RCA; LVEF is estimated at 45% with inferoapical and lateral wall akinesis >> PCI: PCI: 3.5 x 24 mm Promus DES to prox LAD, 2.5 x 12 mm Promus DES to mid LAD.   Cerebellar stroke 03/30/2020   2021 MRI - Small, old left cerebellar infarct    Chest pain    Chronic HFrEF (heart failure with reduced ejection fraction) 10/25/2022   Chronic systolic CHF (congestive heart failure) 03/21/2016   Echo 01/30/17: Diff HK, mild focal basal septal hypertrophy, EF 30-35, mild AI, MAC, mild MR // Echo 06/08/16: Mild focal basal septal hypertrophy, EF 25-30%, diff HK, ant-septal AK, Gr 1 DD, mild AI, MAC, mild MR, PASP 37 mmHg // Echo 03/18/16: EF 30-35%, ant-septal AK, Gr 1 DD, mild MR, severe LAE.     Constipation    Diarrhea 01/02/2023   Dizziness    Dyspnea    Endotracheal  tube present 10/24/2022   Essential hypertension 04/27/2008   Well-controlled today.  She will continue valsartan 80 mg twice a day, Imdur 30 mg daily, Lasix 20 mg every other day, [name] 10 mg daily, and carvedilol 12.5 mg twice daily.   Finger fracture 08/15/2021   She reports this is well-healing.  She will continue to see orthopedics.   Generalized anxiety disorder 04/27/2008   Glaucoma    Gout    History of acute anterior wall MI 03/17/2016   History of colonoscopy 04/27/2008   History of COVID-19 06/27/2022   HLD (hyperlipidemia) 07/28/2015   Check lipid panel.  She will continue Lipitor.   Hot flash, menopausal 02/07/2018   Will check with our clinical  pharmacist regarding the black cohosh and primrose in this patient.   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   Iron deficiency anemia, unspecified 04/27/2008   Ischemic cardiomyopathy 10/02/2016   Joint pain    Left bundle branch block 12/03/2018   Chronic.  She will follow with cardiology.   Major depressive disorder 07/28/2015   Chronic ongoing issue.  Has worsened somewhat recently.  Notes passive SI.  Advised if she develops intent or plan to harm herself she needs to go to the emergency room.  I advised her to contact her psychiatrist to arrange for follow-up and she was willing to do so.  She does have protective factors in place including her husband, dog, and nephew.   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  She will follow with cardiology.  We will send her EKG to her psychiatrist and have CMA contact the psychiatry office to inform them that the EKG was faxed and of the results.   Rash 02/07/2018   Possibly related to folliculitis or some other undetermined cause of her rash.  She will trial over-the-counter antibiotic ointment and if not beneficial she will let us know and we can refer her to dermatology.   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 Excell Seltzer and Dr. 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   She will continue [name] 10 mg daily.   Uterine leiomyoma 04/27/2008   Vitamin D deficiency 09/15/2010    Tobacco Use: Social History   Tobacco Use  Smoking Status Former   Packs/day: 0.50   Years: 25.00   Additional pack years: 0.00   Total pack years: 12.50   Types: Cigarettes   Quit date: 04/13/1997   Years since quitting: 26.1  Smokeless Tobacco Never    Labs: Review Flowsheet  More data exists      Latest Ref Rng & Units 10/15/2022 10/24/2022 10/25/2022 10/26/2022 03/20/2023  Labs for ITP Cardiac and Pulmonary Rehab  Cholestrol 0 - 200 mg/dL - - - - 621   LDL (calc) 0 - 99 mg/dL - - - - 75   HDL-C >30.86 mg/dL - - - -  44.90   Trlycerides 0.0 - 149.0 mg/dL - - - - 16.1   Hemoglobin A1c 4.6 - 6.5 % - - - - 5.9   PH, Arterial 7.35 - 7.45 - 7.316  7.349  7.348  7.380  7.437  7.379  7.263  - - -  PCO2 arterial 32 - 48 mmHg - 40.6  38.3  35.2  35.7  32.0  34.0  42.2  - - -  Bicarbonate 20.0 - 28.0 mmol/L 25.4  25.8  20.7  21.1  19.3  21.4  21.6  27.8  20.0  19.1  - - -  TCO2 22 - 32 mmol/L 27  27  22  22  20  23  23  24   49  29  21  20  20  23   - - -  Acid-base deficit 0.0 - 2.0 mmol/L - 5.0  4.0  6.0  4.0  2.0  5.0  7.0  - - -  O2 Saturation % 64  64  98  79  98  98  93  100  81  100  100  70.1  78.2  -     Exercise Target Goals: Exercise Program Goal: Individual exercise prescription set using results from initial 6 min walk test and THRR while considering  patient's activity barriers and safety.   Exercise Prescription Goal: Initial exercise prescription builds to 30-45 minutes a day of aerobic activity, 2-3 days per week.  Home exercise guidelines will be given to patient during program as part of exercise prescription that the participant will acknowledge.   Education: Aerobic Exercise: - Group verbal and visual presentation on the components of exercise prescription. Introduces F.I.T.T principle from ACSM for exercise prescriptions.  Reviews F.I.T.T. principles of  aerobic exercise including progression. Written material given at graduation.   Education: Resistance Exercise: - Group verbal and visual presentation on the components of exercise prescription. Introduces F.I.T.T principle from ACSM for exercise prescriptions  Reviews F.I.T.T. principles of resistance exercise including progression. Written material given at graduation.    Education: Exercise & Equipment Safety: - Individual verbal instruction and demonstration of equipment use and safety with use of the equipment. Flowsheet Row Cardiac Rehab from 01/28/2023 in [facility] Cardiac and Pulmonary Rehab  Date 01/28/23  Educator [facility]  Instruction Review Code 1- Verbalizes Understanding       Education: Exercise Physiology & General Exercise Guidelines: - Group verbal and written instruction with models to review the exercise physiology of the cardiovascular system and associated critical values. Provides general exercise guidelines with specific guidelines to those with heart or lung disease.    Education: Flexibility, Balance, Mind/Body Relaxation: - Group verbal and visual presentation with interactive activity on the components of exercise prescription. Introduces F.I.T.T principle from ACSM for exercise prescriptions. Reviews F.I.T.T. principles of flexibility and balance exercise training including progression. Also discusses the mind body connection.  Reviews various relaxation techniques to help reduce and manage stress (i.e. Deep breathing, progressive muscle relaxation, and visualization). Balance handout provided to take home. Written material given at graduation.   Activity Barriers & Risk Stratification:  Activity Barriers & Cardiac Risk Stratification - 01/28/23 1704       Activity Barriers & Cardiac Risk Stratification   Activity Barriers None    Cardiac Risk Stratification High             6 Minute Walk:  6 Minute Walk     Row Name 01/28/23 1649  6 Minute Walk    Phase Initial     Distance 820 feet     Walk Time 6 minutes     # of Rest Breaks 0     MPH 1.55     METS 1.87     RPE 11     Perceived Dyspnea  1     VO2 Peak 6.54     Symptoms No     Resting HR 85 bpm     Resting BP 124/72     Resting Oxygen Saturation  100 %     Exercise Oxygen Saturation  during 6 min walk 100 %     Max Ex. HR 91 bpm     Max Ex. BP 112/60     2 Minute Post BP 104/60              Oxygen Initial Assessment:   Oxygen Re-Evaluation:   Oxygen Discharge (Final Oxygen Re-Evaluation):   Initial Exercise Prescription:  Initial Exercise Prescription - 01/28/23 1700       Date of Initial Exercise RX and Referring Provider   Date 01/28/23    Referring Provider Excell Seltzer      Oxygen   Maintain Oxygen Saturation 88% or higher      Treadmill   MPH 1.2    Grade 0    Minutes 15    METs 1.92      Recumbant Bike   Level 1    RPM 50    Watts 15    Minutes 15      NuStep   Level 1    SPM 80    Minutes 15    METs 1.87      T5 Nustep   Level 1    SPM 80    Minutes 15    METs 1.87      Biostep-RELP   Level 1    SPM 50    Minutes 15    METs 1.87      Prescription Details   Frequency (times per week) 2    Duration Progress to 30 minutes of continuous aerobic without signs/symptoms of physical distress      Intensity   THRR 40-80% of Max Heartrate 110-135    Ratings of Perceived Exertion 11-13    Perceived Dyspnea 0-4      Progression   Progression Continue to progress workloads to maintain intensity without signs/symptoms of physical distress.      Resistance Training   Training Prescription Yes    Weight 2    Reps 10-15             Perform Capillary Blood Glucose checks as needed.  Exercise Prescription Changes:   Exercise Prescription Changes     Row Name 01/28/23 1700 02/11/23 1400 02/25/23 1500 03/12/23 0800 03/18/23 1600     Response to Exercise   Blood Pressure (Admit) 124/72 132/62 128/68 130/60 --   Blood  Pressure (Exercise) 112/60 136/60 142/62 138/60 --   Blood Pressure (Exit) 104/60 130/64 148/60 124/82 --   Heart Rate (Admit) 85 bpm 71 bpm 71 bpm 68 bpm --   Heart Rate (Exercise) 91 bpm 94 bpm 94 bpm 102 bpm --   Heart Rate (Exit) 86 bpm 82 bpm 63 bpm 79 bpm --   Oxygen Saturation (Admit) 100 % -- -- -- --   Oxygen Saturation (Exercise) 100 % -- -- -- --   Oxygen Saturation (Exit) 100 % -- -- -- --  Rating of Perceived Exertion (Exercise) 11 13 13 13  --   Perceived Dyspnea (Exercise) 1 -- -- -- --   Symptoms none none none none --   Comments 6 MWT results 2nd full day of exercise -- -- --   Duration -- Progress to 30 minutes of  aerobic without signs/symptoms of physical distress Continue with 30 min of aerobic exercise without signs/symptoms of physical distress. Continue with 30 min of aerobic exercise without signs/symptoms of physical distress. --   Intensity -- THRR unchanged THRR unchanged THRR unchanged --     Progression   Progression -- Continue to progress workloads to maintain intensity without signs/symptoms of physical distress. Continue to progress workloads to maintain intensity without signs/symptoms of physical distress. Continue to progress workloads to maintain intensity without signs/symptoms of physical distress. --   Average METs -- 1.72 1.95 2.11 --     Resistance Training   Training Prescription -- Yes Yes Yes --   Weight -- 2 lb 2 lb 2 lb --   Reps -- 10-15 10-15 10-15 --     Interval Training   Interval Training -- No No No --     Treadmill   MPH -- -- 0.7 0.7 --   Grade -- -- 0 0 --   Minutes -- -- 15 15 --   METs -- -- 1.54 1.5 --     NuStep   Level -- 1 2 2  --   Minutes -- 15 15 15  --   METs -- 1.9 2.5 2.6 --     REL-XR   Level -- -- 1 -- --   Minutes -- -- 15 -- --   METs -- -- 1.6 -- --     T5 Nustep   Level -- 1 -- 1 --   Minutes -- 15 -- 15 --   METs -- 1.8 -- 1.7 --     Track   Laps -- 14 22 30  --   Minutes -- 15 15 15  --   METs  -- 1.76 2.2 2.63 --     Home Exercise Plan   Plans to continue exercise at -- -- -- -- Home (comment)  walking, possibly looking into [employer]   Frequency -- -- -- -- Add 2 additional days to program exercise sessions.   Initial Home Exercises Provided -- -- -- -- 03/18/23     Oxygen   Maintain Oxygen Saturation -- 88% or higher 88% or higher 88% or higher 88% or higher    Row Name 03/26/23 0800 04/09/23 0800 04/23/23 1500 05/10/23 0700       Response to Exercise   Blood Pressure (Admit) 128/54 102/60 114/66 130/70    Blood Pressure (Exercise) -- -- 142/72 140/70    Blood Pressure (Exit) 128/60 122/72 100/60 124/66    Heart Rate (Admit) 82 bpm 88 bpm 85 bpm 87 bpm    Heart Rate (Exercise) 97 bpm 104 bpm 100 bpm 94 bpm    Heart Rate (Exit) 78 bpm 86 bpm 80 bpm 80 bpm    Rating of Perceived Exertion (Exercise) 13 13 13 13     Symptoms none none none none    Duration Continue with 30 min of aerobic exercise without signs/symptoms of physical distress. Continue with 30 min of aerobic exercise without signs/symptoms of physical distress. Continue with 30 min of aerobic exercise without signs/symptoms of physical distress. Continue with 30 min of aerobic exercise without signs/symptoms of physical distress.    Intensity THRR unchanged THRR  unchanged THRR unchanged THRR unchanged      Progression   Progression Continue to progress workloads to maintain intensity without signs/symptoms of physical distress. Continue to progress workloads to maintain intensity without signs/symptoms of physical distress. Continue to progress workloads to maintain intensity without signs/symptoms of physical distress. Continue to progress workloads to maintain intensity without signs/symptoms of physical distress.    Average METs 1.97 2.66 2.74 2.24      Resistance Training   Training Prescription Yes Yes Yes Yes    Weight 2 lb 2 lb 2 lb 2 lb    Reps 10-15 10-15 10-15 10-15      Interval Training   Interval  Training No No No No      Treadmill   MPH 0.7 -- -- --    Grade 0 -- -- --    Minutes 15 -- -- --    METs 1.54 -- -- --      NuStep   Level -- 2 -- 1    Minutes -- 15 -- 15    METs -- 2.7 -- 2.24      Arm Ergometer   Level -- -- 1 --    Minutes -- -- 15 --    METs -- -- 2.5 --      REL-XR   Level 1 -- 1 1    Minutes 15 -- 15 15    METs 2.4 -- 3 --      Track   Laps -- 30 35 30    Minutes -- 15 15 15     METs -- 2.63 2.9 2.13      Home Exercise Plan   Plans to continue exercise at Home (comment)  walking, possibly looking into [facility] (comment)  walking, possibly looking into [facility] (comment)  walking, possibly looking into [facility] (comment)  walking, possibly looking into [employer]    Frequency Add 2 additional days to program exercise sessions. Add 2 additional days to program exercise sessions. Add 2 additional days to program exercise sessions. Add 2 additional days to program exercise sessions.    Initial Home Exercises Provided 03/18/23 03/18/23 03/18/23 03/18/23      Oxygen   Maintain Oxygen Saturation 88% or higher 88% or higher 88% or higher 88% or higher             Exercise Comments:   Exercise Comments     Row Name 02/05/23 1007           Exercise Comments First full day of exercise!  Patient was oriented to gym and equipment including functions, settings, policies, and procedures.  Patient's individual exercise prescription and treatment plan were reviewed.  All starting workloads were established based on the results of the 6 minute walk test done at initial orientation visit.  The plan for exercise progression was also introduced and progression will be customized based on patient's performance and goals.                Exercise Goals and Review:   Exercise Goals     Row Name 01/28/23 1715             Exercise Goals   Increase Physical Activity Yes       Intervention Provide advice, education, support and counseling  about physical activity/exercise needs.;Develop an individualized exercise prescription for aerobic and resistive training based on initial evaluation findings, risk stratification, comorbidities and participant's personal goals.       Expected Outcomes Short  Term: Attend rehab on a regular basis to increase amount of physical activity.;Long Term: Add in home exercise to make exercise part of routine and to increase amount of physical activity.;Long Term: Exercising regularly at least 3-5 days a week.       Increase Strength and Stamina Yes       Intervention Provide advice, education, support and counseling about physical activity/exercise needs.;Develop an individualized exercise prescription for aerobic and resistive training based on initial evaluation findings, risk stratification, comorbidities and participant's personal goals.       Expected Outcomes Short Term: Increase workloads from initial exercise prescription for resistance, speed, and METs.;Short Term: Perform resistance training exercises routinely during rehab and add in resistance training at home;Long Term: Improve cardiorespiratory fitness, muscular endurance and strength as measured by increased METs and functional capacity ( )       Able to understand and use rate of perceived exertion (RPE) scale Yes       Intervention Provide education and explanation on how to use RPE scale       Expected Outcomes Short Term: Able to use RPE daily in rehab to express subjective intensity level;Long Term:  Able to use RPE to guide intensity level when exercising independently       Able to understand and use Dyspnea scale Yes       Intervention Provide education and explanation on how to use Dyspnea scale       Expected Outcomes Short Term: Able to use Dyspnea scale daily in rehab to express subjective sense of shortness of breath during exertion;Long Term: Able to use Dyspnea scale to guide intensity level when exercising independently        Knowledge and understanding of Target Heart Rate Range (THRR) Yes       Intervention Provide education and explanation of THRR including how the numbers were predicted and where they are located for reference       Expected Outcomes Short Term: Able to state/look up THRR;Long Term: Able to use THRR to govern intensity when exercising independently;Short Term: Able to use daily as guideline for intensity in rehab       Able to check pulse independently Yes       Intervention Provide education and demonstration on how to check pulse in carotid and radial arteries.;Review the importance of being able to check your own pulse for safety during independent exercise       Expected Outcomes Short Term: Able to explain why pulse checking is important during independent exercise;Long Term: Able to check pulse independently and accurately       Understanding of Exercise Prescription Yes       Intervention Provide education, explanation, and written materials on patient's individual exercise prescription       Expected Outcomes Short Term: Able to explain program exercise prescription;Long Term: Able to explain home exercise prescription to exercise independently                Exercise Goals Re-Evaluation :  Exercise Goals Re-Evaluation     Row Name 02/05/23 1007 02/11/23 1436 02/25/23 1508 03/12/23 0831 03/18/23 1624     Exercise Goal Re-Evaluation   Exercise Goals Review Increase Physical Activity;Able to understand and use rate of perceived exertion (RPE) scale;Knowledge and understanding of Target Heart Rate Range (THRR);Understanding of Exercise Prescription;Increase Strength and Stamina;Able to check pulse independently Increase Physical Activity;Increase Strength and Stamina;Understanding of Exercise Prescription Increase Physical Activity;Increase Strength and Stamina;Understanding of Exercise Prescription Increase Physical Activity;Increase Strength  and Stamina;Understanding of Exercise  Prescription Increase Physical Activity;Increase Strength and Stamina;Understanding of Exercise Prescription   Comments Reviewed RPE scale, THR and program prescription with pt today.  Pt voiced understanding and was given a copy of goals to take home. Patient is doing well for the first couple of times she has been at rehab. She was able to get 14 laps on the track and exercise the rest at her initial exercise prescription. She had appropriate RPEs throughout the sessions. We will continue to monitor as she progresses in the program. Abbigal is doing well in rehab. She walked up to 22 laps on the track, and also started using the treadmill and did well at a speed of 0.7 mph with no incline. She also was able to improve to level 2 on the T4 nustep and began using the XR at level 1. We will continue to monitor her progress in the program. Carlina continues to do well in rehab. She walked up to 30 laps on the track this time around!  She has been consistent at level 1 on the T5 Nustep and could benefit from increasing to level 2. She has not been quite hitting her THR at this time. She has been walking at a 0.7 mph on the treadmill and seems to benefit more from the track as her speed is increased. Will continue to monitor. Reviewed home exercise with pt today.  Pt plans to walk for exercise. Patient has a treadmill at home. She may also walk outdoors or indoors if the weather is not appropriate. She was encouraged to practice walking on the TM here at rehab to ensure she has a good a balanced feel for it to make sure she is safe with it.  We also discussed the [facility] and she was willing to explore it as an option. She also was encouraged to use household items (like canned food) to help with resistance. Reviewed THR, pulse, RPE, sign and symptoms, pulse oximetery and when to call 911 or MD.  Also discussed weather considerations and indoor options.  Pt voiced understanding.   Expected Outcomes Short: Use RPE daily  to regulate intensity.  Long: Follow program prescription in THR. Short: Continue to work at initial exercise prescription Long: Increase overall MET level and stamina Short: Continue to push for more laps on track, and progressively increase treadmill workload. Long: Continue to improve strength and stamina. Short: Slowly increase speed on treadmill, increase laps on track Long: Continue to increase overall MET level and stamina Short: Add on 1 day of exercise at home, check out the [facility] at her own convenience Long: Continue to exercise independently at home    Row Name 03/26/23 0818 04/09/23 0833 04/23/23 1503 05/10/23 0749       Exercise Goal Re-Evaluation   Exercise Goals Review Increase Physical Activity;Increase Strength and Stamina;Understanding of Exercise Prescription Increase Physical Activity;Increase Strength and Stamina;Understanding of Exercise Prescription Increase Physical Activity;Increase Strength and Stamina;Understanding of Exercise Prescription Increase Physical Activity;Increase Strength and Stamina;Understanding of Exercise Prescription    Comments Teresa has only attended rehab once since the last review. During her one session she was able to work at level 1 on the XR and she walked the treadmill at a speed of 0.7 mph with no incline. She also has continued to use 2 lb hand weights for resistance training. We will continue to monitor her progress in the program. Natally is doing well in rehab.  Her attendance has been spotty at best averaging  around once a week.  We have encouraged her to attend consistently to see more progress.  She is up to 30 laps and level 2 on the NuStep.  We will conitnue to monitor her progress. Marlyne is doing well in rehab. She recently increased her overall average MET level to 2.74 METs. She also walked up to 35 laps on the track after previously only walking 30 laps. She began using the arm crank at level 1, and continued ot work at level 1 on the XR  as well. We will continue to monitor her progress. Sonny is maintaining her previous workloads with RPE at 13. Will encourage increasing levels if tolerated to help with  increasingstrength and stamina. Will continue to monitor progression.    Expected Outcomes Short: Return to consistent attendance in rehab. Long: Continue to improve strength and stamina. Short: Attend rehab regularly Long: Continue to improve stamina Short: Try level 2 on the XR. Long: Continue to improve strength and stamina. Short: Try level 2 on the XR or Nustep. Be sure walking at home outside weather permitting or inside. Long: Continue to improve strength and stamina.             Discharge Exercise Prescription (Final Exercise Prescription Changes):  Exercise Prescription Changes - 05/10/23 0700       Response to Exercise   Blood Pressure (Admit) 130/70    Blood Pressure (Exercise) 140/70    Blood Pressure (Exit) 124/66    Heart Rate (Admit) 87 bpm    Heart Rate (Exercise) 94 bpm    Heart Rate (Exit) 80 bpm    Rating of Perceived Exertion (Exercise) 13    Symptoms none    Duration Continue with 30 min of aerobic exercise without signs/symptoms of physical distress.    Intensity THRR unchanged      Progression   Progression Continue to progress workloads to maintain intensity without signs/symptoms of physical distress.    Average METs 2.24      Resistance Training   Training Prescription Yes    Weight 2 lb    Reps 10-15      Interval Training   Interval Training No      NuStep   Level 1    Minutes 15    METs 2.24      REL-XR   Level 1    Minutes 15      Track   Laps 30    Minutes 15    METs 2.13      Home Exercise Plan   Plans to continue exercise at Home (comment)   walking, possibly looking into [employer]   Frequency Add 2 additional days to program exercise sessions.    Initial Home Exercises Provided 03/18/23      Oxygen   Maintain Oxygen Saturation 88% or higher              Nutrition:  Target Goals: Understanding of nutrition guidelines, daily intake of sodium 1500mg , cholesterol 200mg , calories 30% from fat and 7% or less from saturated fats, daily to have 5 or more servings of fruits and vegetables.  Education: All About Nutrition: -Group instruction provided by verbal, written material, interactive activities, discussions, models, and posters to present general guidelines for heart healthy nutrition including fat, fiber, MyPlate, the role of sodium in heart healthy nutrition, utilization of the nutrition label, and utilization of this knowledge for meal planning. Follow up email sent as well. Written material given at graduation.   Biometrics:  Pre Biometrics - 01/28/23 1716       Pre Biometrics   Height 5\' 3"  (1.6 m)    Weight 132 lb 1.6 oz (59.9 kg)    Waist Circumference 33 inches    Hip Circumference 36.5 inches    Waist to Hip Ratio 0.9 %    BMI (Calculated) 23.41    Single Leg Stand 7.09 seconds              Nutrition Therapy Plan and Nutrition Goals:  Nutrition Therapy & Goals - 01/28/23 1554       Nutrition Therapy   Diet Heart healthy, low Na, T2DM MNT    Drug/Food Interactions Statins/Certain Fruits;Coumadin/Vit K    Protein (specify units) 75g    Fiber 25 grams    Whole Grain Foods 3 servings    Saturated Fats 12 max. grams    Fruits and Vegetables 8 servings/day    Sodium 2 grams      Personal Nutrition Goals   Nutrition Goal ST: compare 3-4 days of eating with list of foods high in vitamin K to see how much you are eating, read/compare food labels, review paperwork LT: manage INR, maintain A1C <7, limit sodium <2g/day, follow MyPlate guidelines    Comments 72 y.o. F admitted to cardiac rehab for chronic systolic CHF. PMHx includes CAD, HLD, anemia, stroke, severe mitral insufficiency, severe mitral regurgitation, T2DM, osteopenia, anxiety/depression. Medications reviewed atorvastatin, jardiance, furosemide,  potassium-chloride, trazodone, warfarin. B: [place] sausage and eggs D: chicken or fish or beef with a vegetables. She reports using butter, she will use seasoning salt with her meat. She may also have cold cuts. S:she reports having a sweet tooth - she will have a cookie or 1/2 cup of ice cream and she may have reeses PB cup. Drinks: water and diet coke (not even a whole bottle per day and sweet tea (1/4 cup usually). Discussed heart healthy eating and diabetes friendly eating. Armonie would like to work on limiting sodium - reviewed label reading; she feels confident in label reading. Discussed warfarin and vitamin K consistency.      Intervention Plan   Intervention Prescribe, educate and counsel regarding individualized specific dietary modifications aiming towards targeted core components such as weight, hypertension, lipid management, diabetes, heart failure and other comorbidities.;Nutrition handout(s) given to patient.    Expected Outcomes Short Term Goal: Understand basic principles of dietary content, such as calories, fat, sodium, cholesterol and nutrients.;Short Term Goal: A plan has been developed with personal nutrition goals set during dietitian appointment.;Long Term Goal: Adherence to prescribed nutrition plan.             Nutrition Assessments:  MEDIFICTS Score Key: ?70 Need to make dietary changes  40-70 Heart Healthy Diet ? 40 Therapeutic Level Cholesterol Diet  Flowsheet Row Cardiac Rehab from 02/05/2023 in [facility]  Picture Your Plate Total Score on Admission 51      Picture Your Plate Scores: <82 Unhealthy dietary pattern with much room for improvement. 41-50 Dietary pattern unlikely to meet recommendations for good health and room for improvement. 51-60 More healthful dietary pattern, with some room for improvement.  >60 Healthy dietary pattern, although there may be some specific behaviors that could be improved.    Nutrition Goals  Re-Evaluation:  Nutrition Goals Re-Evaluation     Row Name 03/18/23 1617 03/27/23 1552 04/08/23 1613         Goals   Current Weight -- -- 136 lb (61.7 kg)  Nutrition Goal ST: compare 3-4 days of eating with list of foods high in vitamin K to see how much you are eating, read/compare food labels, review paperwork LT: manage INR, maintain A1C <7, limit sodium <2g/day, follow MyPlate guidelines -- Eat more     Comment Kamree states she has not really read or paid attention to her Vitamin K intake. Encouraged to start keeping track of the foods she eats (per RD goals take 3-4 days worth of eating) and advised her to discuss with her MD as our RD is not in her role anymore to review how much she should be having. Staff to review education with patient as well  so patient has a better understanding on importance. Provide a handout. She is reading labels for her sodium intake and staying mindful on what she is eating and how much she is eating Per encounter dated from 4/9- patient is not taking warfarin anymore and therefore does not need to monitor Vitamin K levels.  RN reviewed education and information with patient and confirmed medication list. New goals can be established by new RD for further nutrition goals if needed. Nothings is tasting good for her anymore and she has had a poor appetite since she is taking care of her husband. She is not intrested in meeting with the dietitian.     Expected Outcome Short: Understand Vitamin K education involved with her diet, staff to review more information. Patient to start keeping log of food for next 3-4 days per RD goal Long: Continue to follow guidelines by RD and eat a heart healthy diet Short: Establish new goals by new RD if needed Long: Continue to eat heart healthy diet Short: try to eat more when able. Long: maintain a diet that adheres to her.              Nutrition Goals Discharge (Final Nutrition Goals Re-Evaluation):  Nutrition Goals  Re-Evaluation - 04/08/23 1613       Goals   Current Weight 136 lb (61.7 kg)    Nutrition Goal Eat more    Comment Nothings is tasting good for her anymore and she has had a poor appetite since she is taking care of her husband. She is not intrested in meeting with the dietitian.    Expected Outcome Short: try to eat more when able. Long: maintain a diet that adheres to her.             Psychosocial: Target Goals: Acknowledge presence or absence of significant depression and/or stress, maximize coping skills, provide positive support system. Participant is able to verbalize types and ability to use techniques and skills needed for reducing stress and depression.   Education: Stress, Anxiety, and Depression - Group verbal and visual presentation to define topics covered.  Reviews how body is impacted by stress, anxiety, and depression.  Also discusses healthy ways to reduce stress and to treat/manage anxiety and depression.  Written material given at graduation.   Education: Sleep Hygiene -Provides group verbal and written instruction about how sleep can affect your health.  Define sleep hygiene, discuss sleep cycles and impact of sleep habits. Review good sleep hygiene tips.    Initial Review & Psychosocial Screening:  Initial Psych Review & Screening - 12/26/22 1345       Initial Review   Current issues with History of Depression;Current Depression;Current Anxiety/Panic      Family Dynamics   Good Support System? Yes    Comments Her depression and anxiety does  not stem from any one thing. Angell can look to her sister, brother and husband. She is ready to get her health on track and start rehab.      Barriers   Psychosocial barriers to participate in program The patient should benefit from training in stress management and relaxation.      Screening Interventions   Interventions Encouraged to exercise;Provide feedback about the scores to participant;To provide support and  resources with identified psychosocial needs    Expected Outcomes Short Term goal: Utilizing psychosocial counselor, staff and physician to assist with identification of specific Stressors or current issues interfering with healing process. Setting desired goal for each stressor or current issue identified.;Long Term Goal: Stressors or current issues are controlled or eliminated.;Short Term goal: Identification and review with participant of any Quality of Life or Depression concerns found by scoring the questionnaire.;Long Term goal: The participant improves quality of Life and PHQ9 Scores as seen by post scores and/or verbalization of changes             Quality of Life Scores:   Quality of Life - 02/05/23 1016       Quality of Life   Select Quality of Life      Quality of Life Scores   Health/Function Pre 16.5 %    Socioeconomic Pre 29.14 %    Psych/Spiritual Pre 16 %    Family Pre 26 %    GLOBAL Pre 20.57 %            Scores of 19 and below usually indicate a poorer quality of life in these areas.  A difference of  2-3 points is a clinically meaningful difference.  A difference of 2-3 points in the total score of the Quality of Life Index has been associated with significant improvement in overall quality of life, self-image, physical symptoms, and general health in studies assessing change in quality of life.  PHQ-9: Review Flowsheet  More data exists      05/13/2023 04/22/2023 04/08/2023 03/20/2023 01/28/2023  Depression screen PHQ 2/9  Decreased Interest 2 2 3 1 1   Down, Depressed, Hopeless 2 2 3 1 1   PHQ - 2 Score 4 4 6 2 2   Altered sleeping 3 1 1 1 1   Tired, decreased energy 2 2 1 2 1   Change in appetite 2 1 1  0 0  Feeling bad or failure about yourself  2 2 0 1 0  Trouble concentrating 1 0 0 0 0  Moving slowly or fidgety/restless 1 0 0 1 0  Suicidal thoughts 3 3 1 1  0  PHQ-9 Score 18 13 10 8 4   Difficult doing work/chores Somewhat difficult Somewhat difficult Somewhat  difficult Somewhat difficult Not difficult at all   Interpretation of Total Score  Total Score Depression Severity:  1-4 = Minimal depression, 5-9 = Mild depression, 10-14 = Moderate depression, 15-19 = Moderately severe depression, 20-27 = Severe depression   Psychosocial Evaluation and Intervention:  Psychosocial Evaluation - 12/26/22 1349       Psychosocial Evaluation & Interventions   Interventions Encouraged to exercise with the program and follow exercise prescription;Relaxation education;Stress management education    Comments Her depression and anxiety does not stem from any one thing. Glena can look to her sister, brother and husband. She is ready to get her health on track and start rehab.    Expected Outcomes Short: Start [facility] to help with mood. Long: Maintain a healthy mental state    Continue Psychosocial Services  Follow up required by staff             Psychosocial Re-Evaluation:  Psychosocial Re-Evaluation     Row Name 03/18/23 1621 04/08/23 1610           Psychosocial Re-Evaluation   Current issues with Current Depression;Current Psychotropic Meds Current Depression;Current Psychotropic Meds      Comments Mikey [employer] states she is doing well mentally. She takes Trazadone before her sleep which helps her. She just came back from a trip from [place]- her sister just got doctor in education. She is taking all of her medications appropriately and states she has no extra stress, anxiety, or depression symptoms at this time. She is enjoying coming to rehab. Encouraged her to reach out should anything change. Reviewed patient health questionnaire (PHQ-9) with patient for follow up. Previously, patients score indicated signs/symptoms of depression.  Reviewed to see if patient is improving symptom wise while in program.  Score declined and patient states that it is because she takes care of her husband who is in a facility and her dog requires alot of attention. She has no  plans to hurt herself at this time. She states its stems more from the stress of life and that it would not be there if she were to pass.      Expected Outcomes Short: Continue coming to rehab for mood boost Long: Continue to maintain positive attitude and utilize exercise for stress management Short: Continue to work toward an improvement in PHQ9 scores by attending [facility] regularly. Long: Continue to improve stress and depression coping skills by talking with staff and attending [facility] regularly and work toward a positive mental state.      Interventions Encouraged to attend Cardiac Rehabilitation for the exercise Encouraged to attend Cardiac Rehabilitation for the exercise      Continue Psychosocial Services  Follow up required by staff Follow up required by staff               Psychosocial Discharge (Final Psychosocial Re-Evaluation):  Psychosocial Re-Evaluation - 04/08/23 1610       Psychosocial Re-Evaluation   Current issues with Current Depression;Current Psychotropic Meds    Comments Reviewed patient health questionnaire (PHQ-9) with patient for follow up. Previously, patients score indicated signs/symptoms of depression.  Reviewed to see if patient is improving symptom wise while in program.  Score declined and patient states that it is because she takes care of her husband who is in a facility and her dog requires alot of attention. She has no plans to hurt herself at this time. She states its stems more from the stress of life and that it would not be there if she were to pass.    Expected Outcomes Short: Continue to work toward an improvement in PHQ9 scores by attending [facility] regularly. Long: Continue to improve stress and depression coping skills by talking with staff and attending [facility] regularly and work toward a positive mental state.    Interventions Encouraged to attend Cardiac Rehabilitation for the exercise    Continue  Psychosocial Services  Follow up required by staff             Vocational Rehabilitation: Provide vocational rehab assistance to qualifying candidates.   Vocational Rehab Evaluation & Intervention:   Education: Education Goals: Education classes will be provided on a variety of topics geared toward better understanding of heart health and risk factor modification. Participant will state understanding/return demonstration of topics presented as noted by education test scores.  Learning Barriers/Preferences:  Learning Barriers/Preferences - 12/26/22 1342       Learning Barriers/Preferences   Learning Barriers None    Learning Preferences None             General Cardiac Education Topics:  AED/CPR: - Group verbal and written instruction with the use of models to demonstrate the basic use of the AED with the basic ABC's of resuscitation.   Anatomy and Cardiac Procedures: - Group verbal and visual presentation and models provide information about basic cardiac anatomy and function. Reviews the testing methods done to diagnose heart disease and the outcomes of the test results. Describes the treatment choices: Medical Management, Angioplasty, or Coronary Bypass Surgery for treating various heart conditions including Myocardial Infarction, Angina, Valve Disease, and Cardiac Arrhythmias.  Written material given at graduation.   Medication Safety: - Group verbal and visual instruction to review commonly prescribed medications for heart and lung disease. Reviews the medication, class of the drug, and side effects. Includes the steps to properly store meds and maintain the prescription regimen.  Written material given at graduation.   Intimacy: - Group verbal instruction through game format to discuss how heart and lung disease can affect sexual intimacy. Written material given at graduation..   Know Your Numbers and Heart Failure: - Group verbal and visual instruction to  discuss disease risk factors for cardiac and pulmonary disease and treatment options.  Reviews associated critical values for Overweight/Obesity, Hypertension, Cholesterol, and Diabetes.  Discusses basics of heart failure: signs/symptoms and treatments.  Introduces Heart Failure Zone chart for action plan for heart failure.  Written material given at graduation.   Infection Prevention: - Provides verbal and written material to individual with discussion of infection control including proper hand washing and proper equipment cleaning during exercise session. Flowsheet Row Cardiac Rehab from 01/28/2023 in [facility]  Date 01/28/23  Educator [facility]  Instruction Review Code 1- Verbalizes Understanding       Falls Prevention: - Provides verbal and written material to individual with discussion of falls prevention and safety. Flowsheet Row Cardiac Rehab from 01/28/2023 in [facility] Cardiac and Pulmonary Rehab  Date 01/28/23  Educator [facility]  Instruction Review Code 1- Verbalizes Understanding       Other: -Provides group and verbal instruction on various topics (see comments)   Knowledge Questionnaire Score:  Knowledge Questionnaire Score - 02/05/23 1016       Knowledge Questionnaire Score   Pre Score 22/26             Core Components/Risk Factors/Patient Goals at Admission:  Personal Goals and Risk Factors at Admission - 01/28/23 1716       Core Components/Risk Factors/Patient Goals on Admission    Weight Management Yes;Weight Maintenance    Intervention Weight Management: Develop a combined nutrition and exercise program designed to reach desired caloric intake, while maintaining appropriate intake of nutrient and fiber, sodium and fats, and appropriate energy expenditure required for the weight goal.;Weight Management: Provide education and appropriate resources to help participant work on and attain dietary goals.;Weight Management/Obesity: Establish reasonable  short term and long term weight goals.    Admit Weight 132 lb 1.6 oz (59.9 kg)    Goal Weight: Short Term 132 lb (59.9 kg)    Goal Weight: Long Term 132 lb (59.9 kg)    Expected Outcomes Short Term: Continue to assess and modify interventions until short term weight is achieved;Long Term: Adherence to nutrition and physical activity/exercise program aimed toward attainment of  established weight goal;Weight Maintenance: Understanding of the daily nutrition guidelines, which includes 25-35% calories from fat, 7% or less cal from saturated fats, less than 200mg  cholesterol, less than 1.5gm of sodium, & 5 or more servings of fruits and vegetables daily;Understanding recommendations for meals to include 15-35% energy as protein, 25-35% energy from fat, 35-60% energy from carbohydrates, less than 200mg  of dietary cholesterol, 20-35 gm of total fiber daily;Understanding of distribution of calorie intake throughout the day with the consumption of 4-5 meals/snacks    Diabetes Yes    Intervention Provide education about signs/symptoms and action to take for hypo/hyperglycemia.;Provide education about proper nutrition, including hydration, and aerobic/resistive exercise prescription along with prescribed medications to achieve blood glucose in normal ranges: Fasting glucose 65-99 mg/dL    Expected Outcomes Short Term: Participant verbalizes understanding of the signs/symptoms and immediate care of hyper/hypoglycemia, proper foot care and importance of medication, aerobic/resistive exercise and nutrition plan for blood glucose control.;Long Term: Attainment of HbA1C < 7%.    Heart Failure Yes    Intervention Provide a combined exercise and nutrition program that is supplemented with education, support and counseling about heart failure. Directed toward relieving symptoms such as shortness of breath, decreased exercise tolerance, and extremity edema.    Expected Outcomes Improve functional capacity of life;Short term:  Attendance in program 2-3 days a week with increased exercise capacity. Reported lower sodium intake. Reported increased fruit and vegetable intake. Reports medication compliance.;Long term: Adoption of self-care skills and reduction of barriers for early signs and symptoms recognition and intervention leading to self-care maintenance.;Short term: Daily weights obtained and reported for increase. Utilizing diuretic protocols set by physician.    Hypertension Yes    Intervention Provide education on lifestyle modifcations including regular physical activity/exercise, weight management, moderate sodium restriction and increased consumption of fresh fruit, vegetables, and low fat dairy, alcohol moderation, and smoking cessation.;Monitor prescription use compliance.    Expected Outcomes Short Term: Continued assessment and intervention until BP is < 140/7mm HG in hypertensive participants. < 130/71mm HG in hypertensive participants with diabetes, heart failure or chronic kidney disease.;Long Term: Maintenance of blood pressure at goal levels.    Lipids Yes    Intervention Provide education and support for participant on nutrition & aerobic/resistive exercise along with prescribed medications to achieve LDL 70mg , HDL >40mg .    Expected Outcomes Short Term: Participant states understanding of desired cholesterol values and is compliant with medications prescribed. Participant is following exercise prescription and nutrition guidelines.;Long Term: Cholesterol controlled with medications as prescribed, with individualized exercise RX and with personalized nutrition plan. Value goals: LDL < 70mg , HDL > 40 mg.             Education:Diabetes - Individual verbal and written instruction to review signs/symptoms of diabetes, desired ranges of glucose level fasting, after meals and with exercise. Acknowledge that pre and post exercise glucose checks will be done for 3 sessions at entry of program. Flowsheet Row  Cardiac Rehab from 01/28/2023 in [facility] Cardiac and Pulmonary Rehab  Date 01/28/23  Educator [facility]  Instruction Review Code 1- Verbalizes Understanding       Core Components/Risk Factors/Patient Goals Review:   Goals and Risk Factor Review     Row Name 03/18/23 1619 04/08/23 1619           Core Components/Risk Factors/Patient Goals Review   Personal Goals Review Weight Management/Obesity;Heart Failure;Diabetes;Hypertension Hypertension      Review Rodolfo states she is maintaining her weight between 130-135 lb. She knows to  look for any potential fluid gain with a sudden weight gain. Denies any heart failure symptoms at this time. Does not have a BP cuff but encouraged to get one so she can monitor her BP at home. She is diet controlled for her diabetes and sees PCP this Wednesday, 5/15. and may get A1C checked then. Glorias blood pressure has been doing well. She does not check her blood pressure at home and informed her to get one if she was able to. Explained why it is important to take blood pressure at home while taking medicaions.      Expected Outcomes Short: See PCP on Wednesday. buy BP cuff for at home monitoring Long: Continue to manage lifestyle risk factors Short: obtain a blood pressure cuff. Long: maintain blood pressure readings at home.               Core Components/Risk Factors/Patient Goals at Discharge (Final Review):   Goals and Risk Factor Review - 04/08/23 1619       Core Components/Risk Factors/Patient Goals Review   Personal Goals Review Hypertension    Review Glorias blood pressure has been doing well. She does not check her blood pressure at home and informed her to get one if she was able to. Explained why it is important to take blood pressure at home while taking medicaions.    Expected Outcomes Short: obtain a blood pressure cuff. Long: maintain blood pressure readings at home.             ITP Comments:  ITP Comments     Row Name 12/26/22 1341  01/28/23 1647 02/05/23 1006 02/20/23 1428 03/20/23 1204   ITP Comments Virtual Visit completed. Patient informed on EP and RD appointment and 6 Minute walk test. Patient also informed of patient health questionnaires on My Chart. Patient Verbalizes understanding. Visit diagnosis can be found in [facility] 10/24/2022. Completed and gym orientation. Initial ITP created and sent for review to Dr. Daniel Nones, Medical Director. First full day of exercise!  Patient was oriented to gym and equipment including functions, settings, policies, and procedures.  Patient's individual exercise prescription and treatment plan were reviewed.  All starting workloads were established based on the results of the 6 minute walk test done at initial orientation visit.  The plan for exercise progression was also introduced and progression will be customized based on patient's performance and goals. 30 day review completed. ITP sent to Dr. Bethann Punches, Medical Director of Cardiac Rehab. Continue with ITP unless changes are made by physician. 30 Day review completed. Medical Director ITP review done, changes made as directed, and signed approval by Medical Director.    Row Name 03/27/23 1904 04/17/23 1140 05/14/23 1513       ITP Comments Per encounter dated from 4/9- patient is not taking warfarin anymore.  RN reviewed education with patient and confirmed medication list. New goals can be established by new RD for further nutrition goals if needed. 30 Day review completed. Medical Director ITP review done, changes made as directed, and signed approval by Medical Director. 30 Day review completed. Medical Director ITP review done, changes made as directed, and signed approval by Medical Director.              Comments:

## 2023-05-15 ENCOUNTER — Other Ambulatory Visit (INDEPENDENT_AMBULATORY_CARE_PROVIDER_SITE_OTHER): Payer: Medicare Other

## 2023-05-15 ENCOUNTER — Telehealth: Payer: Self-pay | Admitting: Family Medicine

## 2023-05-15 DIAGNOSIS — I5022 Chronic systolic (congestive) heart failure: Secondary | ICD-10-CM | POA: Diagnosis not present

## 2023-05-15 DIAGNOSIS — F331 Major depressive disorder, recurrent, moderate: Secondary | ICD-10-CM

## 2023-05-15 LAB — BASIC METABOLIC PANEL
BUN: 15 mg/dL (ref 6–23)
CO2: 25 mEq/L (ref 19–32)
Calcium: 9.8 mg/dL (ref 8.4–10.5)
Chloride: 108 mEq/L (ref 96–112)
Creatinine, Ser: 0.93 mg/dL (ref 0.40–1.20)
GFR: 61.17 mL/min (ref >60.00)
Glucose, Bld: 92 mg/dL (ref 70–99)
Potassium: 3.8 mEq/L (ref 3.5–5.1)
Sodium: 141 mEq/L (ref 135–145)

## 2023-05-15 NOTE — Assessment & Plan Note (Signed)
Chronic issue.  Gabapentin has not been terribly beneficial.  She will continue gabapentin 300 mg twice daily.  Could consider veozah in the future.

## 2023-05-15 NOTE — Progress Notes (Signed)
Marikay Alar, MD Phone: [phone]  Melanie Ray is a 72 y.o. female who presents today for follow-up.  Anxiety/depression: Patient notes she has not heard from psychiatry yet.  We just placed the referral today and I advised her that this could take some time.  She continues to have issues with anxiety and depression.  She has thoughts she would be better off dead though has no plan or intent to harm herself.  She has trouble sleeping well.  She does not think the trazodone is working for her.  She goes to bed at 10 PM and wakes up after few hours.  She reports she is not currently taking anything other than trazodone and gabapentin for her anxiety and depression.  Patient notes memory continues to be an issue and she is quite afraid that she could develop dementia.  In the past she has seen neurology and they felt like her memory issues were related to her depression and anxiety.  She reports an appointment with a new neurologist in the near future.  Hot flashes: Patient notes she has had hot flashes since her mid 15s.  She recently saw Dr. Clent Ridges and she prescribed Paxil for her though the patient never got the Paxil.  She notes the gabapentin does not do much for the hot flashes.  Social History   Tobacco Use  Smoking Status Former   Packs/day: 0.50   Years: 25.00   Additional pack years: 0.00   Total pack years: 12.50   Types: Cigarettes   Quit date: 04/13/1997   Years since quitting: 26.1  Smokeless Tobacco Never    Current Outpatient Medications on File Prior to Visit  Medication Sig Dispense Refill   aspirin EC 81 MG tablet Take 1 tablet (81 mg total) by mouth daily. Swallow whole. 30 tablet 12   atorvastatin (LIPITOR) 80 MG tablet Take 1 tablet (80 mg total) by mouth daily. 90 tablet 3   carvedilol (COREG) 12.5 MG tablet Take 0.5 tablets (6.25 mg total) by mouth 2 (two) times daily with a meal. 60 tablet 1   dorzolamide-timolol (COSOPT) 22.3-6.8 MG/ML ophthalmic  solution Place 1 drop into both eyes 2 (two) times daily.      furosemide (LASIX) 40 MG tablet Take 40 mg by mouth as needed.     gabapentin (NEURONTIN) 100 MG capsule Take 3 capsules (300 mg total) by mouth 2 (two) times daily. 60 capsule 0   isosorbide mononitrate (IMDUR) 30 MG 24 hr tablet Take 30 mg by mouth daily.     latanoprost (XALATAN) 0.005 % ophthalmic solution Place 1 drop into both eyes at bedtime.     memantine (NAMENDA) 5 MG tablet Take 5 mg by mouth daily.     spironolactone (ALDACTONE) 25 MG tablet Take 0.5 tablets (12.5 mg total) by mouth daily. 45 tablet 3   traZODone (DESYREL) 100 MG tablet Take 2 tablets (200 mg total) by mouth at bedtime. 60 tablet 0   valsartan (DIOVAN) 80 MG tablet Take 80 mg by mouth daily.     No current facility-administered medications on file prior to visit.     ROS see history of present illness  Objective  Physical Exam Vitals:   05/13/23 1635 05/13/23 1651  BP: (!) 144/86 138/80  Pulse: 95   Temp: 98.7 F (37.1 C)   SpO2: 97%     BP Readings from Last 3 Encounters:  05/13/23 138/80  05/13/23 138/67  04/22/23 124/78   Wt Readings from Last 3  Encounters:  05/13/23 135 lb 9.6 oz (61.5 kg)  05/13/23 136 lb (61.7 kg)  04/22/23 133 lb 3.2 oz (60.4 kg)    Physical Exam Constitutional:      General: She is not in acute distress.    Appearance: She is not diaphoretic.  Cardiovascular:     Rate and Rhythm: Normal rate and regular rhythm.     Heart sounds: Normal heart sounds.  Pulmonary:     Effort: Pulmonary effort is normal.     Breath sounds: Normal breath sounds.  Skin:    General: Skin is warm and dry.  Neurological:     Mental Status: She is alert.  Psychiatric:     Comments: Intermittently tearful      Assessment/Plan: Please see individual problem list.  Moderate episode of recurrent major depressive disorder (HCC) Assessment & Plan: Patient with chronic anxiety and depression.  Continues to be an issue.   She is currently taking trazodone 200 mg nightly.  Will have her taper off of the trazodone as follows: trazodone 100 mg nightly for 1 week and then take 50 mg nightly for 1 week and then you can discontinue trazodone.  I did discuss starting Remeron to help with anxiety and depression though there was a warning about QT prolongation.  I advised I would discuss this with our clinical pharmacist and help determine what the next best step in management would be.   Generalized anxiety disorder  Chronic systolic CHF (congestive heart failure) -     Basic metabolic panel; Future  Hot flashes Assessment & Plan: Chronic issue.  Gabapentin has not been terribly beneficial.  She will continue gabapentin 300 mg twice daily.  Could consider veozah in the future.      Return in about 1 day (around 05/14/2023) for labs, 4 weeks PCP for anxiety/depression.   Marikay Alar, MD [facility] Primary [facility]

## 2023-05-15 NOTE — Telephone Encounter (Addendum)
Pt called wanting an update on the Remeron medication and pt stated [facility] does not take patients 65 and over so she would like another referral

## 2023-05-15 NOTE — Assessment & Plan Note (Signed)
Patient with chronic anxiety and depression.  Continues to be an issue.  She is currently taking trazodone 200 mg nightly.  Will have her taper off of the trazodone as follows: trazodone 100 mg nightly for 1 week and then take 50 mg nightly for 1 week and then you can discontinue trazodone.  I did discuss starting Remeron to help with anxiety and depression though there was a warning about QT prolongation.  I advised I would discuss this with our clinical pharmacist and help determine what the next best step in management would be.

## 2023-05-16 NOTE — Telephone Encounter (Signed)
1) Pt needs need new referral psychiatry  2) Per note on 7/10 regarding Remeron: Patient with chronic anxiety and depression. Continues to be an issue. She is currently taking trazodone 200 mg nightly. Will have her taper off of the trazodone as follows: trazodone 100 mg nightly for 1 week and then take 50 mg nightly for 1 week and then you can discontinue trazodone. I did discuss starting Remeron to help with anxiety and depression though there was a warning about QT prolongation. I advised I would discuss this with our clinical pharmacist and help determine what the next best step in management would be

## 2023-05-17 MED ORDER — MIRTAZAPINE 7.5 MG PO TABS
7.5000 mg | ORAL_TABLET | Freq: Every day | ORAL | 2 refills | Status: AC
Start: 2023-05-17 — End: ?

## 2023-05-17 NOTE — Telephone Encounter (Signed)
I heard back from our clinical pharmacist. She felt it would be ok to try the remeron. Per my review of literature the remeron should be ok for her to use with the QT prolongation noted on prior EKGs. I will send this in to her pharmacy to start on. If she is excessively drowsy on this she should let us know. Would she like a referral to a Child psychotherapist to help her navigate finding a new psychiatrist?

## 2023-05-17 NOTE — Telephone Encounter (Signed)
Referral placed.

## 2023-05-17 NOTE — Telephone Encounter (Signed)
Spoke to Patient she understands and is agreeable to the Remeron and the social worker to help her navigate finding a new psychiatrist. Patient said "thank you so much."

## 2023-05-20 ENCOUNTER — Telehealth: Payer: Self-pay

## 2023-05-20 ENCOUNTER — Encounter: Payer: Medicare Other | Admitting: *Deleted

## 2023-05-20 DIAGNOSIS — Z952 Presence of prosthetic heart valve: Secondary | ICD-10-CM | POA: Diagnosis not present

## 2023-05-20 DIAGNOSIS — I5022 Chronic systolic (congestive) heart failure: Secondary | ICD-10-CM | POA: Diagnosis not present

## 2023-05-20 NOTE — Telephone Encounter (Signed)
Can you find out what about it isn't working for her? Is she having side effects? Other issues with the medication?

## 2023-05-20 NOTE — Telephone Encounter (Signed)
Remeron is not helping with anxiety and not helping her sleep. Anxiety is not getting any worse or any better.

## 2023-05-20 NOTE — Telephone Encounter (Signed)
Patient states Dr. Marikay Alar called in mirtazapine (REMERON) 7.5 MG tablet for her and it is not working.  Patient states she would like to know if Dr. Birdie Sons can prescribe something else for her.

## 2023-05-20 NOTE — Progress Notes (Signed)
Daily Session Note  Patient Details  Name: Melanie Ray MRN: [id number] Date of Birth: 03/15/1951 Referring Provider:   Flowsheet Row Cardiac Rehab from 01/28/2023 in [facility] Cardiac and Pulmonary Rehab  Referring Provider Excell Seltzer       Encounter Date: 05/20/2023  Check In:  Session Check In - 05/20/23 1550       Check-In   Supervising physician immediately available to respond to emergencies See telemetry face sheet for immediately available ER MD    Location [facility]-Cardiac & Pulmonary Rehab    Staff Present Susann Givens, RN Mabeline Caras, BS, ACSM CEP, Exercise Physiologist;Laureen Manson Passey, BS, RRT, CPFT    Virtual Visit No    Medication changes reported     No    Fall or balance concerns reported    No    Warm-up and Cool-down Performed on first and last piece of equipment    Resistance Training Performed Yes    VAD Patient? No    PAD/SET Patient? No      Pain Assessment   Currently in Pain? No/denies                Social History   Tobacco Use  Smoking Status Former   Current packs/day: 0.00   Average packs/day: 0.5 packs/day for 25.0 years (12.5 ttl pk-yrs)   Types: Cigarettes   Start date: 04/13/1972   Quit date: 04/13/1997   Years since quitting: 26.1  Smokeless Tobacco Never    Goals Met:  Independence with exercise equipment Exercise tolerated well No report of concerns or symptoms today Strength training completed today  Goals Unmet:  Not Applicable  Comments: Pt able to follow exercise prescription today without complaint.  Will continue to monitor for progression.    Dr. Bethann Punches is Medical Director for [facility] Cardiac Rehabilitation.  Dr. Vida Rigger is Medical Director for [facility] Pulmonary Rehabilitation.

## 2023-05-21 ENCOUNTER — Ambulatory Visit: Payer: Medicare Other | Admitting: Family Medicine

## 2023-05-21 NOTE — Telephone Encounter (Signed)
Noted. It could take up to 6-8 weeks to see benefit from this medication for her anxiety and depression. It will take that long to see benefit from any long term anti-anxiety/depression medication. After she has been on the 7.5 mg dose for a week she could increase the remeron to 15 mg daily to see if that provides benefit for her sleep.

## 2023-05-22 ENCOUNTER — Encounter: Payer: Medicare Other | Admitting: *Deleted

## 2023-05-22 DIAGNOSIS — Z952 Presence of prosthetic heart valve: Secondary | ICD-10-CM

## 2023-05-22 DIAGNOSIS — I5022 Chronic systolic (congestive) heart failure: Secondary | ICD-10-CM | POA: Diagnosis not present

## 2023-05-22 NOTE — Telephone Encounter (Signed)
Pt made aware of timeframe to see benefit of medication.  She has been taking for over a week so she will increase the dose to 15mg  today.  While on phone pt reported that she has been having increased gas and 2-3 bowel movements per day.  She has tried OTC beano but has not had any relief. She would like to know if there are any other medications that she could try.

## 2023-05-22 NOTE — Telephone Encounter (Signed)
Patient is scheduled for 05/24/23 at 4:00.

## 2023-05-22 NOTE — Progress Notes (Signed)
Daily Session Note  Patient Details  Name: Melanie Ray MRN: [id number] Date of Birth: November 21, 1950 Referring Provider:   Flowsheet Row Cardiac Rehab from 01/28/2023 in [facility] Cardiac and Pulmonary Rehab  Referring Provider Excell Seltzer       Encounter Date: 05/22/2023  Check In:  Session Check In - 05/22/23 1537       Check-In   Supervising physician immediately available to respond to emergencies See telemetry face sheet for immediately available ER MD    Location [facility]-Cardiac & Pulmonary Rehab    Staff Present Hulen Luster, BS, RRT, CPFT;Birt Reinoso Jewel Baize, RN BSN;Joseph Reino Kent, RCP,RRT,BSRT    Virtual Visit No    Medication changes reported     No    Fall or balance concerns reported    No    Warm-up and Cool-down Performed on first and last piece of equipment    Resistance Training Performed Yes    VAD Patient? No    PAD/SET Patient? No      Pain Assessment   Currently in Pain? No/denies                Social History   Tobacco Use  Smoking Status Former   Current packs/day: 0.00   Average packs/day: 0.5 packs/day for 25.0 years (12.5 ttl pk-yrs)   Types: Cigarettes   Start date: 04/13/1972   Quit date: 04/13/1997   Years since quitting: 26.1  Smokeless Tobacco Never    Goals Met:  Independence with exercise equipment Exercise tolerated well No report of concerns or symptoms today Strength training completed today  Goals Unmet:  Not Applicable  Comments: Pt able to follow exercise prescription today without complaint.  Will continue to monitor for progression.    Dr. Bethann Punches is Medical Director for [facility] Cardiac Rehabilitation.  Dr. Vida Rigger is Medical Director for [facility] Pulmonary Rehabilitation.

## 2023-05-22 NOTE — Telephone Encounter (Signed)
Noted. I would suggest evaluation in the office with me at some point to discuss the increased BMs to help determine appropriate treatment.

## 2023-05-23 ENCOUNTER — Telehealth: Payer: Self-pay | Admitting: *Deleted

## 2023-05-23 NOTE — Progress Notes (Signed)
  Care Coordination   Note   05/23/2023 Name: Melanie Ray MRN: [id number] DOB: 26-Jan-1951  Melanie Ray is a 72 y.o. year old female who sees Birdie Sons, Yehuda Mao, MD for primary care. I reached out to [name] by phone today to offer care coordination services.  Ms. Kizziah was given information about Care Coordination services today including:   The Care Coordination services include support from the care team which includes your Nurse Coordinator, Clinical Social Worker, or Pharmacist.  The Care Coordination team is here to help remove barriers to the health concerns and goals most important to you. Care Coordination services are voluntary, and the patient may decline or stop services at any time by request to their care team member.   Care Coordination Consent Status: Patient agreed to services and verbal consent obtained.   Follow up plan:  Telephone appointment with care coordination team member scheduled for:  05/30/2023  Encounter Outcome:  Pt. Scheduled from referral   Burman Nieves, [facility] Care Coordination Care Guide Direct Dial: [phone]

## 2023-05-24 ENCOUNTER — Encounter: Payer: Medicare Other | Admitting: Nurse Practitioner

## 2023-05-24 ENCOUNTER — Encounter: Payer: Self-pay | Admitting: Nurse Practitioner

## 2023-05-24 ENCOUNTER — Telehealth: Payer: Self-pay

## 2023-05-24 ENCOUNTER — Ambulatory Visit: Payer: Medicare Other | Admitting: Family Medicine

## 2023-05-24 NOTE — Progress Notes (Signed)
Error   This encounter was created in error - please disregard.

## 2023-05-24 NOTE — Telephone Encounter (Signed)
Called pt in regards to her phone  visit with Bethanie Dicker to inform her that with her sx's she would need to be seen in person to have tests done I informed her that she can go to an urgent care or ED she refused and stated she will wait until she can be seen in person.   Front office was asked to place her on PCPs 05-27-23 schedule at 4:30 for in person per PCPS MA he is okay with seeing pt in person during his virtual slot

## 2023-05-27 ENCOUNTER — Encounter: Payer: Medicare Other | Admitting: *Deleted

## 2023-05-27 ENCOUNTER — Ambulatory Visit: Payer: Medicare Other | Admitting: Family Medicine

## 2023-05-27 DIAGNOSIS — Z952 Presence of prosthetic heart valve: Secondary | ICD-10-CM | POA: Diagnosis not present

## 2023-05-27 DIAGNOSIS — I5022 Chronic systolic (congestive) heart failure: Secondary | ICD-10-CM

## 2023-05-27 NOTE — Progress Notes (Signed)
Daily Session Note  Patient Details  Name: Melanie Ray MRN: [id number] Date of Birth: 05/06/1951 Referring Provider:   Flowsheet Row Cardiac Rehab from 01/28/2023 in [facility] Cardiac and Pulmonary Rehab  Referring Provider Excell Seltzer       Encounter Date: 05/27/2023  Check In:  Session Check In - 05/27/23 1540       Check-In   Supervising physician immediately available to respond to emergencies See telemetry face sheet for immediately available ER MD    Location [facility]-Cardiac & Pulmonary Rehab    Staff Present Hulen Luster, BS, RRT, CPFT;Donie Moulton Jewel Baize, RN BSN;Joseph Reino Kent, RCP,RRT,BSRT    Virtual Visit No    Medication changes reported     No    Fall or balance concerns reported    No    Warm-up and Cool-down Performed on first and last piece of equipment    Resistance Training Performed Yes    VAD Patient? No    PAD/SET Patient? No      Pain Assessment   Currently in Pain? No/denies                Social History   Tobacco Use  Smoking Status Former   Current packs/day: 0.00   Average packs/day: 0.5 packs/day for 25.0 years (12.5 ttl pk-yrs)   Types: Cigarettes   Start date: 04/13/1972   Quit date: 04/13/1997   Years since quitting: 26.1  Smokeless Tobacco Never    Goals Met:  Independence with exercise equipment Exercise tolerated well No report of concerns or symptoms today Strength training completed today  Goals Unmet:  Not Applicable  Comments: Pt able to follow exercise prescription today without complaint.  Will continue to monitor for progression.    Dr. Bethann Punches is Medical Director for [facility] Cardiac Rehabilitation.  Dr. Vida Rigger is Medical Director for [facility] Pulmonary Rehabilitation.

## 2023-05-28 DIAGNOSIS — F39 Unspecified mood [affective] disorder: Secondary | ICD-10-CM | POA: Diagnosis not present

## 2023-05-28 DIAGNOSIS — G3184 Mild cognitive impairment, so stated: Secondary | ICD-10-CM | POA: Diagnosis not present

## 2023-05-28 DIAGNOSIS — F339 Major depressive disorder, recurrent, unspecified: Secondary | ICD-10-CM | POA: Diagnosis not present

## 2023-05-28 DIAGNOSIS — E119 Type 2 diabetes mellitus without complications: Secondary | ICD-10-CM | POA: Diagnosis not present

## 2023-05-29 DIAGNOSIS — Z952 Presence of prosthetic heart valve: Secondary | ICD-10-CM

## 2023-05-29 DIAGNOSIS — I5022 Chronic systolic (congestive) heart failure: Secondary | ICD-10-CM | POA: Diagnosis not present

## 2023-05-29 NOTE — Progress Notes (Signed)
Daily Session Note  Patient Details  Name: Melanie Ray MRN: [id number] Date of Birth: Nov 15, 1950 Referring Provider:   Flowsheet Row Cardiac Rehab from 01/28/2023 in [facility] Cardiac and Pulmonary Rehab  Referring Provider Excell Seltzer       Encounter Date: 05/29/2023  Check In:  Session Check In - 05/29/23 1545       Check-In   Supervising physician immediately available to respond to emergencies See telemetry face sheet for immediately available ER MD    Location [facility]-Cardiac & Pulmonary Rehab    Staff Present Susann Givens, RN Atilano Median, RN, ADN   Girtha Rm and Max Conetta   Virtual Visit No    Medication changes reported     No    Fall or balance concerns reported    No    Warm-up and Cool-down Performed on first and last piece of equipment    Resistance Training Performed Yes    VAD Patient? No    PAD/SET Patient? No      Pain Assessment   Currently in Pain? No/denies                Social History   Tobacco Use  Smoking Status Former   Current packs/day: 0.00   Average packs/day: 0.5 packs/day for 25.0 years (12.5 ttl pk-yrs)   Types: Cigarettes   Start date: 04/13/1972   Quit date: 04/13/1997   Years since quitting: 26.1  Smokeless Tobacco Never    Goals Met:  Independence with exercise equipment Exercise tolerated well No report of concerns or symptoms today Strength training completed today  Goals Unmet:  Not Applicable  Comments: Pt able to follow exercise prescription today without complaint.  Will continue to monitor for progression.    Dr. Bethann Punches is Medical Director for [facility] Cardiac Rehabilitation.  Dr. Vida Rigger is Medical Director for [facility] Pulmonary Rehabilitation.

## 2023-05-30 ENCOUNTER — Ambulatory Visit: Payer: Self-pay | Admitting: *Deleted

## 2023-05-30 NOTE — Patient Outreach (Signed)
  Care Coordination   Initial Visit Note   05/30/2023 Name: Melanie Ray MRN: [id number] DOB: 1951-01-17  Melanie Ray is a 72 y.o. year old female who sees Birdie Sons, Yehuda Mao, MD for primary care. I spoke with  Melanie Ray by phone today.  What matters to the patients health and wellness today?  Mental health follow up-patient having difficulty identifying at Psychiatrist for follow up. Per patient, [facility] is not able to see her due to her age. [facility] is not taking patient until the end of August. Patient states that she had an appointment scheduled in June but got lost traveling there and does not want to return.    Goals Addressed             This Visit's Progress    Mental Health follow up       Interventions Today    Flowsheet Row Most Recent Value  Chronic Disease   Chronic disease during today's visit Other  [depression, anxiety]  General Interventions   General Interventions Discussed/Reviewed General Interventions Discussed, Community Resources  Mental Health Interventions   Mental Health Discussed/Reviewed Mental Health Discussed, Coping Strategies  [Discussed options for follow up with a psychiatrist, options limited due to availability and location.  CSW to continue follow up with patient regarding available options to consider for mental health follow up]              SDOH assessments and interventions completed:  Yes  SDOH Interventions Today    Flowsheet Row Most Recent Value  SDOH Interventions   Food Insecurity Interventions Intervention Not Indicated  Housing Interventions Intervention Not Indicated  Transportation Interventions Intervention Not Indicated  Depression Interventions/Treatment  Medication, Referral to Psychiatry        Care Coordination Interventions:  Yes, provided   Follow up plan: Follow up call scheduled for 06/04/23    Encounter Outcome:  Pt. Visit Completed

## 2023-05-30 NOTE — Patient Instructions (Signed)
Visit Information  Thank you for taking time to visit with me today. Please don't hesitate to contact me if I can be of assistance to you.   Following are the goals we discussed today:  Please    Our next appointment is by telephone on 06/05/23 at 10:30am  Please call the care guide team at [phone] if you need to cancel or reschedule your appointment.   If you are experiencing a Mental Health or Behavioral Health Crisis or need someone to talk to, please call the Suicide and Crisis Lifeline: 988   Patient verbalizes understanding of instructions and care plan provided today and agrees to view in MyChart. Active MyChart status and patient understanding of how to access instructions and care plan via MyChart confirmed with patient.     Telephone follow up appointment with care management team member scheduled for:  [facility], [place] Clinical Social Worker  [facility] Care Management [phone]

## 2023-05-31 ENCOUNTER — Ambulatory Visit: Payer: Medicare Other | Admitting: Family Medicine

## 2023-05-31 ENCOUNTER — Encounter: Payer: Self-pay | Admitting: Family Medicine

## 2023-05-31 ENCOUNTER — Telehealth: Payer: Self-pay

## 2023-05-31 ENCOUNTER — Ambulatory Visit: Payer: Medicare Other | Admitting: Nurse Practitioner

## 2023-05-31 VITALS — BP 118/68 | HR 90 | Temp 98.1°F | Ht 63.0 in | Wt 133.0 lb

## 2023-05-31 DIAGNOSIS — R232 Flushing: Secondary | ICD-10-CM

## 2023-05-31 DIAGNOSIS — R143 Flatulence: Secondary | ICD-10-CM | POA: Insufficient documentation

## 2023-05-31 DIAGNOSIS — F331 Major depressive disorder, recurrent, moderate: Secondary | ICD-10-CM | POA: Diagnosis not present

## 2023-05-31 DIAGNOSIS — R42 Dizziness and giddiness: Secondary | ICD-10-CM | POA: Diagnosis not present

## 2023-05-31 DIAGNOSIS — R350 Frequency of micturition: Secondary | ICD-10-CM | POA: Insufficient documentation

## 2023-05-31 LAB — POCT URINALYSIS DIPSTICK
Bilirubin, UA: NEGATIVE
Blood, UA: NEGATIVE
Glucose, UA: NEGATIVE
Ketones, UA: NEGATIVE
Leukocytes, UA: NEGATIVE
Nitrite, UA: NEGATIVE
Protein, UA: NEGATIVE
Spec Grav, UA: 1.02 (ref 1.010–1.025)
Urobilinogen, UA: 0.2 E.U./dL
pH, UA: 5.5 (ref 5.0–8.0)

## 2023-05-31 MED ORDER — GABAPENTIN 400 MG PO CAPS: 400.0000 mg | ORAL_CAPSULE | Freq: Two times a day (BID) | ORAL | 3 refills | Status: DC

## 2023-05-31 NOTE — Telephone Encounter (Signed)
Left message to call the office back regarding the urine results below.

## 2023-05-31 NOTE — Assessment & Plan Note (Signed)
New onset issue.  I doubt this is related to medication.  Discussed it could be related to food.  She is given a FODMAP diet to look at.

## 2023-05-31 NOTE — Assessment & Plan Note (Signed)
Chronic issue.  Discussed she needs to talk to her neurologist about this issue to determine if there is some underlying neurological condition that is contributing to her dizziness, stumbling, and speech issues.  Discussed this could be related to her depression or prior psychiatric medications as well.

## 2023-05-31 NOTE — Assessment & Plan Note (Signed)
Check urinalysis today

## 2023-05-31 NOTE — Patient Instructions (Addendum)
I have increased your gabapentin to 400 mg twice daily.  If this makes you drowsy please let us know. Please look at the foods as outlined and try to avoid anything that you eat a lot of to see if that helps with your flatulence.  Low-FODMAP Eating Plan  FODMAP stands for fermentable oligosaccharides, disaccharides, monosaccharides, and polyols. These are sugars that are hard for some people to digest. A low-FODMAP eating plan may help some people who have irritable bowel syndrome (IBS) and certain other bowel (intestinal) diseases to manage their symptoms. This meal plan can be complicated to follow. Work with a diet and nutrition specialist (dietitian) to make a low-FODMAP eating plan that is right for you. A dietitian can help make sure that you get enough nutrition from this diet. What are tips for following this plan? Reading food labels Check labels for hidden FODMAPs such as: High-fructose syrup. Honey. Agave. Natural fruit flavors. Onion or garlic powder. Choose low-FODMAP foods that contain 3-4 grams of fiber per serving. Check food labels for serving sizes. Eat only one serving at a time to make sure FODMAP levels stay low. Shopping Shop with a list of foods that are recommended on this diet and make a meal plan. Meal planning Follow a low-FODMAP eating plan for up to 6 weeks, or as told by your health care provider or dietitian. To follow the eating plan: Eliminate high-FODMAP foods from your diet completely. Choose only low-FODMAP foods to eat. You will do this for 2-6 weeks. Gradually reintroduce high-FODMAP foods into your diet one at a time. Most people should wait a few days before introducing the next new high-FODMAP food into their meal plan. Your dietitian can recommend how quickly you may reintroduce foods. Keep a daily record of what and how much you eat and drink. Make note of any symptoms that you have after eating. Review your daily record with a dietitian regularly  to identify which foods you can eat and which foods you should avoid. General tips Drink enough fluid each day to keep your urine pale yellow. Avoid processed foods. These often have added sugar and may be high in FODMAPs. Avoid most dairy products, whole grains, and sweeteners. Work with a dietitian to make sure you get enough fiber in your diet. Avoid high FODMAP foods at meals to manage symptoms. Recommended foods Fruits Bananas, oranges, tangerines, lemons, limes, blueberries, raspberries, strawberries, grapes, cantaloupe, honeydew melon, kiwi, papaya, passion fruit, and pineapple. Limited amounts of dried cranberries, banana chips, and shredded coconut. Vegetables Eggplant, zucchini, cucumber, peppers, green beans, bean sprouts, lettuce, arugula, kale, Swiss chard, spinach, collard greens, bok choy, summer squash, potato, and tomato. Limited amounts of corn, carrot, and sweet potato. Green parts of scallions. Grains Gluten-free grains, such as rice, oats, buckwheat, quinoa, corn, polenta, and millet. Gluten-free pasta, bread, or cereal. Rice noodles. Corn tortillas. Meats and other proteins Unseasoned beef, pork, poultry, or fish. Eggs. Tomasa Blase. Tofu (firm) and tempeh. Limited amounts of nuts and seeds, such as almonds, walnuts, [place] nuts, pecans, peanuts, nut butters, pumpkin seeds, chia seeds, and sunflower seeds. Dairy Lactose-free milk, yogurt, and kefir. Lactose-free cottage cheese and ice cream. Non-dairy milks, such as almond, coconut, hemp, and rice milk. Non-dairy yogurt. Limited amounts of goat cheese, brie, mozzarella, parmesan, swiss, and other hard cheeses. Fats and oils Butter-free spreads. Vegetable oils, such as olive, canola, and sunflower oil. Seasoning and other foods Artificial sweeteners with names that do not end in "ol," such as aspartame, saccharine, and  stevia. Maple syrup, white table sugar, raw sugar, brown sugar, and molasses. Mayonnaise, soy sauce, and tamari.  Fresh basil, coriander, parsley, rosemary, and thyme. Beverages Water and mineral water. Sugar-sweetened soft drinks. Small amounts of orange juice or cranberry juice. Black and green tea. Most dry wines. Coffee. The items listed above may not be a complete list of foods and beverages you can eat. Contact a dietitian for more information. Foods to avoid Fruits Fresh, dried, and juiced forms of apple, pear, watermelon, peach, plum, cherries, apricots, blackberries, boysenberries, figs, nectarines, and mango. Avocado. Vegetables Chicory root, artichoke, asparagus, cabbage, snow peas, Brussels sprouts, broccoli, sugar snap peas, mushrooms, celery, and cauliflower. Onions, garlic, leeks, and the white part of scallions. Grains Wheat, including kamut, durum, and semolina. Barley and bulgur. Couscous. Wheat-based cereals. Wheat noodles, bread, crackers, and pastries. Meats and other proteins Fried or fatty meat. Sausage. Cashews and pistachios. Soybeans, baked beans, black beans, chickpeas, kidney beans, fava beans, navy beans, lentils, black-eyed peas, and split peas. Dairy Milk, yogurt, ice cream, and soft cheese. Cream and sour cream. Milk-based sauces. Custard. Buttermilk. Soy milk. Seasoning and other foods Any sugar-free gum or candy. Foods that contain artificial sweeteners such as sorbitol, mannitol, isomalt, or xylitol. Foods that contain honey, high-fructose corn syrup, or agave. Bouillon, vegetable stock, beef stock, and chicken stock. Garlic and onion powder. Condiments made with onion, such as hummus, chutney, pickles, relish, salad dressing, and salsa. Tomato paste. Beverages Chicory-based drinks. Coffee substitutes. Chamomile tea. Fennel tea. Sweet or fortified wines such as port or sherry. Diet soft drinks made with isomalt, mannitol, maltitol, sorbitol, or xylitol. Apple, pear, and mango juice. Juices with high-fructose corn syrup. The items listed above may not be a complete list of  foods and beverages you should avoid. Contact a dietitian for more information. Summary FODMAP stands for fermentable oligosaccharides, disaccharides, monosaccharides, and polyols. These are sugars that are hard for some people to digest. A low-FODMAP eating plan is a short-term diet that helps to ease symptoms of certain bowel diseases. The eating plan usually lasts up to 6 weeks. After that, high-FODMAP foods are reintroduced gradually and one at a time. This can help you find out which foods may be causing symptoms. A low-FODMAP eating plan can be complicated. It is best to work with a dietitian who has experience with this type of plan. This information is not intended to replace advice given to you by your health care provider. Make sure you discuss any questions you have with your health care provider. Document Revised: 03/10/2020 Document Reviewed: 03/10/2020 Elsevier Patient Education  2024 [employer].

## 2023-05-31 NOTE — Assessment & Plan Note (Signed)
Chronic issue.  She is no longer on trazodone.  She will follow instructions by neurology for Seroquel.  I did discuss she could ramp up on the dose as advised by neurology every week.  She will continue Remeron 15 mg daily.  She will continue to try to get into see psychiatry.

## 2023-05-31 NOTE — Progress Notes (Signed)
Marikay Alar, MD Phone: [phone]  Melanie Ray is a 72 y.o. female who presents today for f/u.  Flatulence: Patient notes increased passing gas and having 3-4 bowel movements a day.  She feels that the bowel movements are normal in consistency and a normal amount of stool.  She notes no straining.  No abdominal pain.  No vomiting, nausea, or diarrhea.  Notes this started several weeks ago and started before she started on the mirtazapine.  She has not identified any food related issues.  Urine frequency: Patient notes increased urine frequency recently.  No dysuria.  No hematuria.  Does report urinary urgency.  Depression/anxiety/memory difficulty: Patient saw neurology recently.  They started her on Seroquel at night and told her to titrate from 50 mg up to 100 mg and then up to 150 mg if needed for sleep.  She started the Seroquel 2 nights ago.  She continues on Remeron 15 mg nightly.  No SI.  She is working with the Child psychotherapist on finding a psychiatrist to see.  Patient also reports some issues with dizziness, stumbling, and speech issues that have been going on for quite some time.  She also reports her mouth moves in ways.  She did not discuss that with the neurologist.  Hot flashes: Patient reports chronic issues with hot flashes occurring at day and at night.  She wonders if there is anything to take for this.  Social History   Tobacco Use  Smoking Status Former   Current packs/day: 0.00   Average packs/day: 0.5 packs/day for 25.0 years (12.5 ttl pk-yrs)   Types: Cigarettes   Start date: 04/13/1972   Quit date: 04/13/1997   Years since quitting: 26.1  Smokeless Tobacco Never    Current Outpatient Medications on File Prior to Visit  Medication Sig Dispense Refill   QUEtiapine (SEROQUEL) 50 MG tablet Take 1-3 tabs nightly for sleep.     aspirin EC 81 MG tablet Take 1 tablet (81 mg total) by mouth daily. Swallow whole. 30 tablet 12   atorvastatin (LIPITOR) 80 MG  tablet Take 1 tablet (80 mg total) by mouth daily. 90 tablet 3   carvedilol (COREG) 12.5 MG tablet Take 0.5 tablets (6.25 mg total) by mouth 2 (two) times daily with a meal. 60 tablet 1   dorzolamide-timolol (COSOPT) 22.3-6.8 MG/ML ophthalmic solution Place 1 drop into both eyes 2 (two) times daily.      furosemide (LASIX) 40 MG tablet Take 40 mg by mouth as needed. (Patient not taking: Reported on 05/24/2023)     isosorbide mononitrate (IMDUR) 30 MG 24 hr tablet Take 30 mg by mouth daily.     latanoprost (XALATAN) 0.005 % ophthalmic solution Place 1 drop into both eyes at bedtime.     memantine (NAMENDA) 5 MG tablet Take 5 mg by mouth daily. (Patient not taking: Reported on 05/31/2023)     mirtazapine (REMERON) 7.5 MG tablet Take 1 tablet (7.5 mg total) by mouth at bedtime. 30 tablet 2   spironolactone (ALDACTONE) 25 MG tablet Take 0.5 tablets (12.5 mg total) by mouth daily. (Patient not taking: Reported on 05/24/2023) 45 tablet 3   traZODone (DESYREL) 150 MG tablet Take by mouth at bedtime as needed for sleep.     valsartan (DIOVAN) 80 MG tablet Take 80 mg by mouth daily.     No current facility-administered medications on file prior to visit.     ROS see history of present illness  Objective  Physical Exam Vitals:  05/31/23 0859  BP: 118/68  Pulse: 90  Temp: 98.1 F (36.7 C)  SpO2: 98%    BP Readings from Last 3 Encounters:  05/31/23 118/68  05/13/23 138/80  05/13/23 138/67   Wt Readings from Last 3 Encounters:  05/31/23 133 lb (60.3 kg)  05/24/23 133 lb (60.3 kg)  05/13/23 135 lb 9.6 oz (61.5 kg)    Physical Exam Constitutional:      General: She is not in acute distress.    Appearance: She is not diaphoretic.  Cardiovascular:     Rate and Rhythm: Normal rate and regular rhythm.     Heart sounds: Normal heart sounds.  Pulmonary:     Effort: Pulmonary effort is normal.     Breath sounds: Normal breath sounds.  Skin:    General: Skin is warm and dry.   Neurological:     Mental Status: She is alert.      Assessment/Plan: Please see individual problem list.  Dizziness Assessment & Plan: Chronic issue.  Discussed she needs to talk to her neurologist about this issue to determine if there is some underlying neurological condition that is contributing to her dizziness, stumbling, and speech issues.  Discussed this could be related to her depression or prior psychiatric medications as well.   Moderate episode of recurrent major depressive disorder [facility]) Assessment & Plan: Chronic issue.  She is no longer on trazodone.  She will follow instructions by neurology for Seroquel.  I did discuss she could ramp up on the dose as advised by neurology every week.  She will continue Remeron 15 mg daily.  She will continue to try to get into see psychiatry.   Flatulence Assessment & Plan: New onset issue.  I doubt this is related to medication.  Discussed it could be related to food.  She is given a FODMAP diet to look at.   Urinary frequency Assessment & Plan: Check urinalysis today.  Orders: -     POCT urinalysis dipstick  Hot flashes Assessment & Plan: Chronic issue.  Patient is on gabapentin 300 mg twice daily.  Will increase gabapentin to 400 mg twice daily and see if this is beneficial.  She will monitor for drowsiness.  Orders: -     Gabapentin; Take 1 capsule (400 mg total) by mouth 2 (two) times daily.  Dispense: 60 capsule; Refill: 3    Return in about 3 months (around 08/31/2023).   Marikay Alar, MD [facility]

## 2023-05-31 NOTE — Telephone Encounter (Signed)
-----   Message from Marikay Alar sent at 05/31/2023  1:11 PM EDT ----- Please let the patient know that her urine does not appear infected.

## 2023-05-31 NOTE — Assessment & Plan Note (Signed)
Chronic issue.  Patient is on gabapentin 300 mg twice daily.  Will increase gabapentin to 400 mg twice daily and see if this is beneficial.  She will monitor for drowsiness.

## 2023-06-05 ENCOUNTER — Encounter (INDEPENDENT_AMBULATORY_CARE_PROVIDER_SITE_OTHER): Payer: Self-pay

## 2023-06-05 ENCOUNTER — Ambulatory Visit: Payer: Self-pay | Admitting: *Deleted

## 2023-06-05 NOTE — Patient Outreach (Signed)
  Care Coordination   Follow Up Visit Note   06/05/2023 Name: Krissia Ralston MRN: [id number] DOB: [birth year]  Ansa Derickson is a 72 y.o. year old female who sees Birdie Sons, Yehuda Mao, MD for primary care. I spoke with  [name] by phone today.  What matters to the patients health and wellness today?  Patient requesting assistance with mental health follow up. Referral sent to [facility]. Patient hesitant due to concern that she would not be able to find her way there. Patient agreeable to having a friend of hers meet her to assist with finding the location. Per [facility], they have availabilities as early as next Thursday or Friday.   Goals Addressed             This Visit's Progress    Mental Health follow up       Interventions Today    Flowsheet Row Most Recent Value  Chronic Disease   Chronic disease during today's visit Other  [depression, anxiety]  General Interventions   General Interventions Discussed/Reviewed General Interventions Reviewed, Community Resources  Mental Health Interventions   Mental Health Discussed/Reviewed Mental Health Reviewed, Other  [Pt continues to need med mgt, [facility] contacted, they are booking initial appts in October, [facility] contacted, confirmed appts this week, referral sent, they will call pt to schedule. Pt. will have friend assist with location]              SDOH assessments and interventions completed:  No     Care Coordination Interventions:  Yes, provided   Follow up plan: Follow up call scheduled for 06/17/23    Encounter Outcome:  Pt. Visit Completed

## 2023-06-05 NOTE — Patient Instructions (Addendum)
Visit Information  Thank you for taking time to visit with me today. Please don't hesitate to contact me if I can be of assistance to you.   Following are the goals we discussed today:  Please anticipate a phone call from [facility] to schedule initial appointment for Medication Management   Our next appointment is by telephone on 06/17/23 at 9am  Please call the care guide team at [phone] if you need to cancel or reschedule your appointment. [phone]  If you are experiencing a Mental Health or Behavioral Health Crisis or need someone to talk to, please call the Suicide and Crisis Lifeline: 988   Patient verbalizes understanding of instructions and care plan provided today and agrees to view in MyChart. Active MyChart status and patient understanding of how to access instructions and care plan via MyChart confirmed with patient.     Telephone follow up appointment with care management team member scheduled for: 06/17/23  Verna Czech, LCSW Clinical Social Worker  [facility] Care Management [phone]

## 2023-06-06 ENCOUNTER — Encounter: Payer: Self-pay | Admitting: *Deleted

## 2023-06-06 NOTE — Patient Outreach (Signed)
  Care Coordination   Follow Up Visit Note   06/06/2023 Name: Cyenthia Spegal MRN: [id number] DOB: 12-03-1950  Demyia Greenway is a 72 y.o. year old female who sees Birdie Sons, Yehuda Mao, MD for primary care. I  spoke with [facility] to follow up on referral for medication management  What matters to the patients health and wellness today?  Medication Management    Goals Addressed             This Visit's Progress    Mental Health follow up       Interventions Today    Flowsheet Row Most Recent Value  General Interventions   General Interventions Discussed/Reviewed Communication with  Communication with --  [[facility] to confirm that referral has been received. it was confirmed that the referrals may take a couple of days to process, it was recommended that this social worker follow up on 8/2]              SDOH assessments and interventions completed:  No     Care Coordination Interventions:  Yes, provided   Follow up plan: Follow up call scheduled for 06/17/23    Encounter Outcome:  Pt. Visit Completed

## 2023-06-06 NOTE — Telephone Encounter (Signed)
This encounter was created in error - please disregard.

## 2023-06-10 ENCOUNTER — Encounter: Payer: Medicare Other | Attending: Cardiovascular Disease | Admitting: *Deleted

## 2023-06-10 DIAGNOSIS — Z952 Presence of prosthetic heart valve: Secondary | ICD-10-CM | POA: Diagnosis not present

## 2023-06-10 DIAGNOSIS — Z48812 Encounter for surgical aftercare following surgery on the circulatory system: Secondary | ICD-10-CM | POA: Diagnosis not present

## 2023-06-10 NOTE — Progress Notes (Signed)
Daily Session Note  Patient Details  Name: Melanie Ray MRN: [id number] Date of Birth: May 10, 1951 Referring Provider:   Flowsheet Row Cardiac Rehab from 01/28/2023 in [facility] Cardiac and Pulmonary Rehab  Referring Provider Excell Seltzer       Encounter Date: 06/10/2023  Check In:  Session Check In - 06/10/23 1549       Check-In   Supervising physician immediately available to respond to emergencies See telemetry face sheet for immediately available ER MD    Location [facility]-Cardiac & Pulmonary Rehab    Staff Present Elige Ko, RCP,RRT,BSRT;Other; Jewel Baize, RN BSN   Max Conetta BS   Virtual Visit No    Medication changes reported     No    Fall or balance concerns reported    No    Warm-up and Cool-down Performed on first and last piece of equipment    Resistance Training Performed Yes    VAD Patient? No    PAD/SET Patient? No      Pain Assessment   Currently in Pain? No/denies                Social History   Tobacco Use  Smoking Status Former   Current packs/day: 0.00   Average packs/day: 0.5 packs/day for 25.0 years (12.5 ttl pk-yrs)   Types: Cigarettes   Start date: 04/13/1972   Quit date: 04/13/1997   Years since quitting: 26.1  Smokeless Tobacco Never    Goals Met:  Independence with exercise equipment Exercise tolerated well No report of concerns or symptoms today Strength training completed today  Goals Unmet:  Not Applicable  Comments: Pt able to follow exercise prescription today without complaint.  Will continue to monitor for progression.    Dr. Bethann Punches is Medical Director for [facility] Cardiac Rehabilitation.  Dr. Vida Rigger is Medical Director for [facility] Pulmonary Rehabilitation.

## 2023-06-11 ENCOUNTER — Encounter: Payer: Self-pay | Admitting: *Deleted

## 2023-06-11 DIAGNOSIS — Z952 Presence of prosthetic heart valve: Secondary | ICD-10-CM

## 2023-06-12 ENCOUNTER — Encounter: Payer: Self-pay | Admitting: *Deleted

## 2023-06-12 ENCOUNTER — Encounter: Payer: Medicare Other | Admitting: *Deleted

## 2023-06-12 DIAGNOSIS — Z952 Presence of prosthetic heart valve: Secondary | ICD-10-CM | POA: Diagnosis not present

## 2023-06-12 DIAGNOSIS — Z48812 Encounter for surgical aftercare following surgery on the circulatory system: Secondary | ICD-10-CM | POA: Diagnosis not present

## 2023-06-12 NOTE — Progress Notes (Signed)
Daily Session Note  Patient Details  Name: Melanie Ray MRN: [id number] Date of Birth: 06-Dec-1950 Referring Provider:   Flowsheet Row Cardiac Rehab from 01/28/2023 in [facility] Cardiac and Pulmonary Rehab  Referring Provider Excell Seltzer       Encounter Date: 06/12/2023  Check In:  Session Check In - 06/12/23 1537       Check-In   Supervising physician immediately available to respond to emergencies See telemetry face sheet for immediately available ER MD    Location [facility]-Cardiac & Pulmonary Rehab    Staff Present Susann Givens, RN BSN;Joseph Reino [place], RCP,RRT,BSRT;Megan Katrinka Blazing, RN, [place]    Virtual Visit No    Medication changes reported     No    Fall or balance concerns reported    No    Warm-up and Cool-down Performed on first and last piece of equipment    Resistance Training Performed Yes    VAD Patient? No    PAD/SET Patient? No      Pain Assessment   Currently in Pain? No/denies                Social History   Tobacco Use  Smoking Status Former   Current packs/day: 0.00   Average packs/day: 0.5 packs/day for 25.0 years (12.5 ttl pk-yrs)   Types: Cigarettes   Start date: 04/13/1972   Quit date: 04/13/1997   Years since quitting: 26.1  Smokeless Tobacco Never    Goals Met:  Independence with exercise equipment Exercise tolerated well No report of concerns or symptoms today Strength training completed today  Goals Unmet:  Not Applicable  Comments: Pt able to follow exercise prescription today without complaint.  Will continue to monitor for progression.    Dr. Bethann Punches is Medical Director for [facility] Cardiac Rehabilitation.  Dr. Vida Rigger is Medical Director for [facility] Pulmonary Rehabilitation.

## 2023-06-12 NOTE — Progress Notes (Signed)
Cardiac Individual Treatment Plan  Patient Details  Name: Melanie Ray MRN: [id number] Date of Birth: Aug 21, 1951 Referring Provider:   Flowsheet Row Cardiac Rehab from 01/28/2023 in [facility] Cardiac and Pulmonary Rehab  Referring Provider Excell Seltzer       Initial Encounter Date:  Flowsheet Row Cardiac Rehab from 01/28/2023 in [facility] Cardiac and Pulmonary Rehab  Date 01/28/23       Visit Diagnosis: S/P mitral valve replacement  Patient's Home Medications on Admission:  Current Outpatient Medications:    aspirin EC 81 MG tablet, Take 1 tablet (81 mg total) by mouth daily. Swallow whole., Disp: 30 tablet, Rfl: 12   atorvastatin (LIPITOR) 80 MG tablet, Take 1 tablet (80 mg total) by mouth daily., Disp: 90 tablet, Rfl: 3   carvedilol (COREG) 12.5 MG tablet, Take 0.5 tablets (6.25 mg total) by mouth 2 (two) times daily with a meal., Disp: 60 tablet, Rfl: 1   dorzolamide-timolol (COSOPT) 22.3-6.8 MG/ML ophthalmic solution, Place 1 drop into both eyes 2 (two) times daily. , Disp: , Rfl:    furosemide (LASIX) 40 MG tablet, Take 40 mg by mouth as needed. (Patient not taking: Reported on 05/24/2023), Disp: , Rfl:    gabapentin (NEURONTIN) 400 MG capsule, Take 1 capsule (400 mg total) by mouth 2 (two) times daily., Disp: 60 capsule, Rfl: 3   isosorbide mononitrate (IMDUR) 30 MG 24 hr tablet, Take 30 mg by mouth daily., Disp: , Rfl:    latanoprost (XALATAN) 0.005 % ophthalmic solution, Place 1 drop into both eyes at bedtime., Disp: , Rfl:    memantine (NAMENDA) 5 MG tablet, Take 5 mg by mouth daily. (Patient not taking: Reported on 05/31/2023), Disp: , Rfl:    mirtazapine (REMERON) 7.5 MG tablet, Take 1 tablet (7.5 mg total) by mouth at bedtime., Disp: 30 tablet, Rfl: 2   QUEtiapine (SEROQUEL) 50 MG tablet, Take 1-3 tabs nightly for sleep., Disp: , Rfl:    spironolactone (ALDACTONE) 25 MG tablet, Take 0.5 tablets (12.5 mg total) by mouth daily. (Patient not taking: Reported on 05/24/2023), Disp: 45  tablet, Rfl: 3   traZODone (DESYREL) 150 MG tablet, Take by mouth at bedtime as needed for sleep., Disp: , Rfl:    valsartan (DIOVAN) 80 MG tablet, Take 80 mg by mouth daily., Disp: , Rfl:   Past Medical History: Past Medical History:  Diagnosis Date   ABLA (acute blood loss anemia) 10/24/2022   AICD (automatic cardioverter/defibrillator) present    Allergic rhinitis 09/25/2022   Back pain    Benign neoplasm of colon 04/27/2008   CAD (coronary artery disease) 04/11/2016   S/p ant STEMI 5/17: LHC >> LAD proximal 80%, mid 80%, distal 50%, ostial D1 60%; LCx with LPDA lesion 30%; RCA Mild calcification with no significant stenosis in a medium caliber, nondominant RCA; LVEF is estimated at 45% with inferoapical and lateral wall akinesis >> PCI: PCI: 3.5 x 24 mm Promus DES to prox LAD, 2.5 x 12 mm Promus DES to mid LAD.   Cerebellar stroke 03/30/2020   2021 MRI - Small, old left cerebellar infarct    Chest pain    Chronic HFrEF (heart failure with reduced ejection fraction) 10/25/2022   Chronic systolic CHF (congestive heart failure) 03/21/2016   Echo 01/30/17: Diff HK, mild focal basal septal hypertrophy, EF 30-35, mild AI, MAC, mild MR // Echo 06/08/16: Mild focal basal septal hypertrophy, EF 25-30%, diff HK, ant-septal AK, Gr 1 DD, mild AI, MAC, mild MR, PASP 37 mmHg // Echo 03/18/16: EF  30-35%, ant-septal AK, Gr 1 DD, mild MR, severe LAE.     Constipation    Diarrhea 01/02/2023   Dizziness    Dyspnea    Endotracheal tube present 10/24/2022   Essential hypertension 04/27/2008   Well-controlled today.  She will continue valsartan 80 mg twice a day, Imdur 30 mg daily, Lasix 20 mg every other day, [name] 10 mg daily, and carvedilol 12.5 mg twice daily.   Finger fracture 08/15/2021   She reports this is well-healing.  She will continue to see orthopedics.   Generalized anxiety disorder 04/27/2008   Glaucoma    Gout    History of acute anterior wall MI 03/17/2016   History of colonoscopy  04/27/2008   History of COVID-19 06/27/2022   HLD (hyperlipidemia) 07/28/2015   Check lipid panel.  She will continue Lipitor.   Hot flash, menopausal 02/07/2018   Will check with our clinical pharmacist regarding the black cohosh and primrose in this patient.   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   Iron deficiency anemia, unspecified 04/27/2008   Ischemic cardiomyopathy 10/02/2016   Joint pain    Left bundle branch block 12/03/2018   Chronic.  She will follow with cardiology.   Major depressive disorder 07/28/2015   Chronic ongoing issue.  Has worsened somewhat recently.  Notes passive SI.  Advised if she develops intent or plan to harm herself she needs to go to the emergency room.  I advised her to contact her psychiatrist to arrange for follow-up and she was willing to do so.  She does have protective factors in place including her husband, dog, and nephew.   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  She will follow with cardiology.  We will send her EKG to her psychiatrist and have CMA contact the psychiatry office to inform them that the EKG was faxed and of the results.   Rash 02/07/2018   Possibly related to folliculitis or some other undetermined cause of her rash.  She will trial over-the-counter antibiotic ointment and if not beneficial she will let us know and we can refer her to dermatology.   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 Excell Seltzer and Dr. 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  She will continue [name] 10 mg daily.   Uterine leiomyoma 04/27/2008   Vitamin D deficiency 09/15/2010    Tobacco Use: Social History   Tobacco Use  Smoking Status Former   Current packs/day: 0.00   Average packs/day: 0.5 packs/day for 25.0 years (12.5 ttl pk-yrs)   Types: Cigarettes   Start date: 04/13/1972   Quit date: 04/13/1997   Years since quitting: 26.1  Smokeless Tobacco Never    Labs: Review Flowsheet  More data exists      Latest Ref Rng & Units 10/15/2022 10/24/2022 10/25/2022 10/26/2022 03/20/2023  Labs for ITP Cardiac  and Pulmonary Rehab  Cholestrol 0 - 200 mg/dL - - - - 161   LDL (calc) 0 - 99 mg/dL - - - - 75   HDL-C >09.60 mg/dL - - - - 45.40   Trlycerides 0.0 - 149.0 mg/dL - - - - 98.1   Hemoglobin A1c 4.6 - 6.5 % - - - - 5.9   PH, Arterial 7.35 - 7.45 - 7.316  7.349  7.348  7.380  7.437  7.379  7.263  - - -  PCO2 arterial 32 - 48 mmHg - 40.6  38.3  35.2  35.7  32.0  34.0  42.2  - - -  Bicarbonate 20.0 - 28.0 mmol/L 25.4  25.8  20.7  21.1  19.3  21.4  21.6  27.8  20.0  19.1  - - -  TCO2 22 - 32 mmol/L 27  27  22  22  20  23  23  24   49  29  21  20  20  23   - - -  Acid-base deficit 0.0 - 2.0 mmol/L - 5.0  4.0  6.0  4.0  2.0  5.0  7.0  - - -  O2 Saturation % 64  64  98  79  98  98  93  100  81  100  100  70.1  78.2  -    Details       Multiple values from one day are sorted in reverse-chronological order          Exercise Target Goals: Exercise Program Goal: Individual exercise prescription set using results from initial 6 min walk test and THRR while considering  patient's activity barriers and safety.   Exercise Prescription Goal: Initial exercise prescription builds to 30-45 minutes a day of aerobic activity, 2-3 days per week.  Home exercise guidelines  will be given to patient during program as part of exercise prescription that the participant will acknowledge.   Education: Aerobic Exercise: - Group verbal and visual presentation on the components of exercise prescription. Introduces F.I.T.T principle from ACSM for exercise prescriptions.  Reviews F.I.T.T. principles of aerobic exercise including progression. Written material given at graduation.   Education: Resistance Exercise: - Group verbal and visual presentation on the components of exercise prescription. Introduces F.I.T.T principle from ACSM for exercise prescriptions  Reviews F.I.T.T. principles of resistance exercise including progression. Written material given at graduation.    Education: Exercise & Equipment Safety: - Individual verbal instruction and demonstration of equipment use and safety with use of the equipment. Flowsheet Row Cardiac Rehab from 01/28/2023 in [facility] Cardiac and Pulmonary Rehab  Date 01/28/23  Educator [facility]  Instruction Review Code 1- Verbalizes Understanding       Education: Exercise Physiology & General Exercise Guidelines: - Group verbal and written instruction with models to review the exercise physiology of the cardiovascular system and associated critical values. Provides general exercise guidelines with specific guidelines to those with heart or lung disease.    Education: Flexibility, Balance, Mind/Body Relaxation: - Group verbal and visual presentation with interactive activity on the components of exercise prescription. Introduces F.I.T.T principle from ACSM for exercise prescriptions. Reviews F.I.T.T. principles of flexibility and balance exercise training including progression. Also discusses the mind body connection.  Reviews various relaxation techniques to help reduce and manage stress (i.e. Deep breathing, progressive muscle relaxation, and visualization). Balance handout provided to take home. Written material given at  graduation.   Activity Barriers & Risk Stratification:  Activity Barriers & Cardiac  Risk Stratification - 01/28/23 1704       Activity Barriers & Cardiac Risk Stratification   Activity Barriers None    Cardiac Risk Stratification High             6 Minute Walk:  6 Minute Walk     Row Name 01/28/23 1649         6 Minute Walk   Phase Initial     Distance 820 feet     Walk Time 6 minutes     # of Rest Breaks 0     MPH 1.55     METS 1.87     RPE 11     Perceived Dyspnea  1     VO2 Peak 6.54     Symptoms No     Resting HR 85 bpm     Resting BP 124/72     Resting Oxygen Saturation  100 %     Exercise Oxygen Saturation  during 6 min walk 100 %     Max Ex. HR 91 bpm     Max Ex. BP 112/60     2 Minute Post BP 104/60              Oxygen Initial Assessment:   Oxygen Re-Evaluation:   Oxygen Discharge (Final Oxygen Re-Evaluation):   Initial Exercise Prescription:  Initial Exercise Prescription - 01/28/23 1700       Date of Initial Exercise RX and Referring Provider   Date 01/28/23    Referring Provider Excell Seltzer      Oxygen   Maintain Oxygen Saturation 88% or higher      Treadmill   MPH 1.2    Grade 0    Minutes 15    METs 1.92      Recumbant Bike   Level 1    RPM 50    Watts 15    Minutes 15      NuStep   Level 1    SPM 80    Minutes 15    METs 1.87      T5 Nustep   Level 1    SPM 80    Minutes 15    METs 1.87      Biostep-RELP   Level 1    SPM 50    Minutes 15    METs 1.87      Prescription Details   Frequency (times per week) 2    Duration Progress to 30 minutes of continuous aerobic without signs/symptoms of physical distress      Intensity   THRR 40-80% of Max Heartrate 110-135    Ratings of Perceived Exertion 11-13    Perceived Dyspnea 0-4      Progression   Progression Continue to progress workloads to maintain intensity without signs/symptoms of physical distress.      Resistance Training   Training Prescription  Yes    Weight 2    Reps 10-15             Perform Capillary Blood Glucose checks as needed.  Exercise Prescription Changes:   Exercise Prescription Changes     Row Name 01/28/23 1700 02/11/23 1400 02/25/23 1500 03/12/23 0800 03/18/23 1600     Response to Exercise   Blood Pressure (Admit) 124/72 132/62 128/68 130/60 --   Blood Pressure (Exercise) 112/60 136/60 142/62 138/60 --   Blood Pressure (Exit) 104/60 130/64 148/60 124/82 --   Heart Rate (Admit) 85 bpm 71 bpm 71 bpm 68 bpm --  Heart Rate (Exercise) 91 bpm 94 bpm 94 bpm 102 bpm --   Heart Rate (Exit) 86 bpm 82 bpm 63 bpm 79 bpm --   Oxygen Saturation (Admit) 100 % -- -- -- --   Oxygen Saturation (Exercise) 100 % -- -- -- --   Oxygen Saturation (Exit) 100 % -- -- -- --   Rating of Perceived Exertion (Exercise) 11 13 13 13  --   Perceived Dyspnea (Exercise) 1 -- -- -- --   Symptoms none none none none --   Comments 6 MWT results 2nd full day of exercise -- -- --   Duration -- Progress to 30 minutes of  aerobic without signs/symptoms of physical distress Continue with 30 min of aerobic exercise without signs/symptoms of physical distress. Continue with 30 min of aerobic exercise without signs/symptoms of physical distress. --   Intensity -- THRR unchanged THRR unchanged THRR unchanged --     Progression   Progression -- Continue to progress workloads to maintain intensity without signs/symptoms of physical distress. Continue to progress workloads to maintain intensity without signs/symptoms of physical distress. Continue to progress workloads to maintain intensity without signs/symptoms of physical distress. --   Average METs -- 1.72 1.95 2.11 --     Resistance Training   Training Prescription -- Yes Yes Yes --   Weight -- 2 lb 2 lb 2 lb --   Reps -- 10-15 10-15 10-15 --     Interval Training   Interval Training -- No No No --     Treadmill   MPH -- -- 0.7 0.7 --   Grade -- -- 0 0 --   Minutes -- -- 15 15 --   METs  -- -- 1.54 1.5 --     NuStep   Level -- 1 2 2  --   Minutes -- 15 15 15  --   METs -- 1.9 2.5 2.6 --     REL-XR   Level -- -- 1 -- --   Minutes -- -- 15 -- --   METs -- -- 1.6 -- --     T5 Nustep   Level -- 1 -- 1 --   Minutes -- 15 -- 15 --   METs -- 1.8 -- 1.7 --     Track   Laps -- 14 22 30  --   Minutes -- 15 15 15  --   METs -- 1.76 2.2 2.63 --     Home Exercise Plan   Plans to continue exercise at -- -- -- -- Home (comment)  walking, possibly looking into [employer]   Frequency -- -- -- -- Add 2 additional days to program exercise sessions.   Initial Home Exercises Provided -- -- -- -- 03/18/23     Oxygen   Maintain Oxygen Saturation -- 88% or higher 88% or higher 88% or higher 88% or higher    Row Name 03/26/23 0800 04/09/23 0800 04/23/23 1500 05/10/23 0700 05/22/23 1400     Response to Exercise   Blood Pressure (Admit) 128/54 102/60 114/66 130/70 124/58   Blood Pressure (Exercise) -- -- 142/72 140/70 144/66   Blood Pressure (Exit) 128/60 122/72 100/60 124/66 134/58   Heart Rate (Admit) 82 bpm 88 bpm 85 bpm 87 bpm 101 bpm   Heart Rate (Exercise) 97 bpm 104 bpm 100 bpm 94 bpm 100 bpm   Heart Rate (Exit) 78 bpm 86 bpm 80 bpm 80 bpm 94 bpm   Rating of Perceived Exertion (Exercise) 13 13 13 13  13  Symptoms none none none none none   Duration Continue with 30 min of aerobic exercise without signs/symptoms of physical distress. Continue with 30 min of aerobic exercise without signs/symptoms of physical distress. Continue with 30 min of aerobic exercise without signs/symptoms of physical distress. Continue with 30 min of aerobic exercise without signs/symptoms of physical distress. Continue with 30 min of aerobic exercise without signs/symptoms of physical distress.   Intensity THRR unchanged THRR unchanged THRR unchanged THRR unchanged THRR unchanged     Progression   Progression Continue to progress workloads to maintain intensity without signs/symptoms of physical distress.  Continue to progress workloads to maintain intensity without signs/symptoms of physical distress. Continue to progress workloads to maintain intensity without signs/symptoms of physical distress. Continue to progress workloads to maintain intensity without signs/symptoms of physical distress. Continue to progress workloads to maintain intensity without signs/symptoms of physical distress.   Average METs 1.97 2.66 2.74 2.24 2.16     Resistance Training   Training Prescription Yes Yes Yes Yes Yes   Weight 2 lb 2 lb 2 lb 2 lb 2 lb   Reps 10-15 10-15 10-15 10-15 10-15     Interval Training   Interval Training No No No No No     Treadmill   MPH 0.7 -- -- -- --   Grade 0 -- -- -- --   Minutes 15 -- -- -- --   METs 1.54 -- -- -- --     NuStep   Level -- 2 -- 1 1   Minutes -- 15 -- 15 15   METs -- 2.7 -- 2.24 2.4     Arm Ergometer   Level -- -- 1 -- --   Minutes -- -- 15 -- --   METs -- -- 2.5 -- --     REL-XR   Level 1 -- 1 1 --   Minutes 15 -- 15 15 --   METs 2.4 -- 3 -- --     Track   Laps -- 30 35 30 17   Minutes -- 15 15 15 15    METs -- 2.63 2.9 2.13 1.92     Home Exercise Plan   Plans to continue exercise at Home (comment)  walking, possibly looking into [facility] (comment)  walking, possibly looking into [facility] (comment)  walking, possibly looking into [facility] (comment)  walking, possibly looking into [facility] (comment)  walking, possibly looking into [employer]   Frequency Add 2 additional days to program exercise sessions. Add 2 additional days to program exercise sessions. Add 2 additional days to program exercise sessions. Add 2 additional days to program exercise sessions. Add 2 additional days to program exercise sessions.   Initial Home Exercises Provided 03/18/23 03/18/23 03/18/23 03/18/23 03/18/23     Oxygen   Maintain Oxygen Saturation 88% or higher 88% or higher 88% or higher 88% or higher 88% or higher    Row Name 06/06/23 1500              Response to Exercise   Blood Pressure (Admit) 114/58       Blood Pressure (Exercise) 140/70       Blood Pressure (Exit) 138/70       Heart Rate (Admit) 84 bpm       Heart Rate (Exercise) 107 bpm       Heart Rate (Exit) 85 bpm       Rating of Perceived Exertion (Exercise) 13       Symptoms none  Duration Continue with 30 min of aerobic exercise without signs/symptoms of physical distress.       Intensity THRR unchanged         Progression   Progression Continue to progress workloads to maintain intensity without signs/symptoms of physical distress.       Average METs 2.07         Resistance Training   Training Prescription Yes       Weight 2 lb       Reps 10-15         Interval Training   Interval Training No         Treadmill   MPH 1       Grade 0       Minutes 15       METs 1.77         NuStep   Level 1       Minutes 15         REL-XR   Level 1       Minutes 15       METs 1.3         Track   Laps 35       Minutes 15       METs 2.9         Home Exercise Plan   Plans to continue exercise at Home (comment)  walking, possibly looking into [employer]       Frequency Add 2 additional days to program exercise sessions.       Initial Home Exercises Provided 03/18/23         Oxygen   Maintain Oxygen Saturation 88% or higher                Exercise Comments:   Exercise Comments     Row Name 02/05/23 1007           Exercise Comments First full day of exercise!  Patient was oriented to gym and equipment including functions, settings, policies, and procedures.  Patient's individual exercise prescription and treatment plan were reviewed.  All starting workloads were established based on the results of the 6 minute walk test done at initial orientation visit.  The plan for exercise progression was also introduced and progression will be customized based on patient's performance and goals.                Exercise Goals and Review:   Exercise  Goals     Row Name 01/28/23 1715             Exercise Goals   Increase Physical Activity Yes       Intervention Provide advice, education, support and counseling about physical activity/exercise needs.;Develop an individualized exercise prescription for aerobic and resistive training based on initial evaluation findings, risk stratification, comorbidities and participant's personal goals.       Expected Outcomes Short Term: Attend rehab on a regular basis to increase amount of physical activity.;Long Term: Add in home exercise to make exercise part of routine and to increase amount of physical activity.;Long Term: Exercising regularly at least 3-5 days a week.       Increase Strength and Stamina Yes       Intervention Provide advice, education, support and counseling about physical activity/exercise needs.;Develop an individualized exercise prescription for aerobic and resistive training based on initial evaluation findings, risk stratification, comorbidities and participant's personal goals.       Expected Outcomes Short Term: Increase workloads from initial  exercise prescription for resistance, speed, and METs.;Short Term: Perform resistance training exercises routinely during rehab and add in resistance training at home;Long Term: Improve cardiorespiratory fitness, muscular endurance and strength as measured by increased METs and functional capacity ( )       Able to understand and use rate of perceived exertion (RPE) scale Yes       Intervention Provide education and explanation on how to use RPE scale       Expected Outcomes Short Term: Able to use RPE daily in rehab to express subjective intensity level;Long Term:  Able to use RPE to guide intensity level when exercising independently       Able to understand and use Dyspnea scale Yes       Intervention Provide education and explanation on how to use Dyspnea scale       Expected Outcomes Short Term: Able to use Dyspnea scale daily in  rehab to express subjective sense of shortness of breath during exertion;Long Term: Able to use Dyspnea scale to guide intensity level when exercising independently       Knowledge and understanding of Target Heart Rate Range (THRR) Yes       Intervention Provide education and explanation of THRR including how the numbers were predicted and where they are located for reference       Expected Outcomes Short Term: Able to state/look up THRR;Long Term: Able to use THRR to govern intensity when exercising independently;Short Term: Able to use daily as guideline for intensity in rehab       Able to check pulse independently Yes       Intervention Provide education and demonstration on how to check pulse in carotid and radial arteries.;Review the importance of being able to check your own pulse for safety during independent exercise       Expected Outcomes Short Term: Able to explain why pulse checking is important during independent exercise;Long Term: Able to check pulse independently and accurately       Understanding of Exercise Prescription Yes       Intervention Provide education, explanation, and written materials on patient's individual exercise prescription       Expected Outcomes Short Term: Able to explain program exercise prescription;Long Term: Able to explain home exercise prescription to exercise independently                Exercise Goals Re-Evaluation :  Exercise Goals Re-Evaluation     Row Name 02/05/23 1007 02/11/23 1436 02/25/23 1508 03/12/23 0831 03/18/23 1624     Exercise Goal Re-Evaluation   Exercise Goals Review Increase Physical Activity;Able to understand and use rate of perceived exertion (RPE) scale;Knowledge and understanding of Target Heart Rate Range (THRR);Understanding of Exercise Prescription;Increase Strength and Stamina;Able to check pulse independently Increase Physical Activity;Increase Strength and Stamina;Understanding of Exercise Prescription Increase  Physical Activity;Increase Strength and Stamina;Understanding of Exercise Prescription Increase Physical Activity;Increase Strength and Stamina;Understanding of Exercise Prescription Increase Physical Activity;Increase Strength and Stamina;Understanding of Exercise Prescription   Comments Reviewed RPE scale, THR and program prescription with pt today.  Pt voiced understanding and was given a copy of goals to take home. Patient is doing well for the first couple of times she has been at rehab. She was able to get 14 laps on the track and exercise the rest at her initial exercise prescription. She had appropriate RPEs throughout the sessions. We will continue to monitor as she progresses in the program. Melanie Ray is doing well in rehab. She walked up  to 22 laps on the track, and also started using the treadmill and did well at a speed of 0.7 mph with no incline. She also was able to improve to level 2 on the T4 nustep and began using the XR at level 1. We will continue to monitor her progress in the program. Melanie Ray continues to do well in rehab. She walked up to 30 laps on the track this time around!  She has been consistent at level 1 on the T5 Nustep and could benefit from increasing to level 2. She has not been quite hitting her THR at this time. She has been walking at a 0.7 mph on the treadmill and seems to benefit more from the track as her speed is increased. Will continue to monitor. Reviewed home exercise with pt today.  Pt plans to walk for exercise. Patient has a treadmill at home. She may also walk outdoors or indoors if the weather is not appropriate. She was encouraged to practice walking on the TM here at rehab to ensure she has a good a balanced feel for it to make sure she is safe with it.  We also discussed the [facility] and she was willing to explore it as an option. She also was encouraged to use household items (like canned food) to help with resistance. Reviewed THR, pulse, RPE, sign and symptoms,  pulse oximetery and when to call 911 or MD.  Also discussed weather considerations and indoor options.  Pt voiced understanding.   Expected Outcomes Short: Use RPE daily to regulate intensity.  Long: Follow program prescription in THR. Short: Continue to work at initial exercise prescription Long: Increase overall MET level and stamina Short: Continue to push for more laps on track, and progressively increase treadmill workload. Long: Continue to improve strength and stamina. Short: Slowly increase speed on treadmill, increase laps on track Long: Continue to increase overall MET level and stamina Short: Add on 1 day of exercise at home, check out the [facility] at her own convenience Long: Continue to exercise independently at home    Row Name 03/26/23 0818 04/09/23 0833 04/23/23 1503 05/10/23 0749 05/22/23 1443     Exercise Goal Re-Evaluation   Exercise Goals Review Increase Physical Activity;Increase Strength and Stamina;Understanding of Exercise Prescription Increase Physical Activity;Increase Strength and Stamina;Understanding of Exercise Prescription Increase Physical Activity;Increase Strength and Stamina;Understanding of Exercise Prescription Increase Physical Activity;Increase Strength and Stamina;Understanding of Exercise Prescription Increase Physical Activity;Increase Strength and Stamina;Understanding of Exercise Prescription   Comments Turner has only attended rehab once since the last review. During her one session she was able to work at level 1 on the XR and she walked the treadmill at a speed of 0.7 mph with no incline. She also has continued to use 2 lb hand weights for resistance training. We will continue to monitor her progress in the program. Melanie Ray is doing well in rehab.  Her attendance has been spotty at best averaging around once a week.  We have encouraged her to attend consistently to see more progress.  She is up to 30 laps and level 2 on the NuStep.  We will conitnue to monitor her  progress. Melanie Ray is doing well in rehab. She recently increased her overall average MET level to 2.74 METs. She also walked up to 35 laps on the track after previously only walking 30 laps. She began using the arm crank at level 1, and continued ot work at level 1 on the XR as well. We will continue  to monitor her progress. Melanie Ray is maintaining her previous workloads with RPE at 13. Will encourage increasing levels if tolerated to help with  increasingstrength and stamina. Will continue to monitor progression. Melanie Ray has only attended one session since the last review. She was able to work at level 1 on the T4 nustep and walk 17 laps on the track. She also continues to use 2 lb hand weights for resistance training. We will continue to monitor her progress in the program.   Expected Outcomes Short: Return to consistent attendance in rehab. Long: Continue to improve strength and stamina. Short: Attend rehab regularly Long: Continue to improve stamina Short: Try level 2 on the XR. Long: Continue to improve strength and stamina. Short: Try level 2 on the XR or Nustep. Be sure walking at home outside weather permitting or inside. Long: Continue to improve strength and stamina. Short: Return to regular attendance, try level 2 on the T4 nustep. Long: Continue exercise to improve strength and stamina.    Row Name 06/06/23 1531 06/10/23 1610           Exercise Goal Re-Evaluation   Exercise Goals Review Increase Physical Activity;Increase Strength and Stamina;Understanding of Exercise Prescription Increase Physical Activity;Increase Strength and Stamina;Understanding of Exercise Prescription      Comments Melanie Ray is doing well in rehab. She recently increased her speed up to 1 mph with no incline. She also walked on the track and increased her laps back up to 35 laps. She also has continued to work at level 1 on the XR and T4 nustep. We will continue to monitor her progress in the program. Melanie Ray reports that she  has not been exercising at home recently, but does have a treadmill at home that she can use. She plans to start by adding in one day of walking at home and working her way up to 3 days at home. She is also getting close to time for her post and will look to improve on it. We will continue to monitor her progress in the program.      Expected Outcomes Short: Try level 2 on the T4 nustep. Long: Continue exercise to improve strength and stamina. Short: Improve on post . Long: Continue exercise to improve strength and stamina.               Discharge Exercise Prescription (Final Exercise Prescription Changes):  Exercise Prescription Changes - 06/06/23 1500       Response to Exercise   Blood Pressure (Admit) 114/58    Blood Pressure (Exercise) 140/70    Blood Pressure (Exit) 138/70    Heart Rate (Admit) 84 bpm    Heart Rate (Exercise) 107 bpm    Heart Rate (Exit) 85 bpm    Rating of Perceived Exertion (Exercise) 13    Symptoms none    Duration Continue with 30 min of aerobic exercise without signs/symptoms of physical distress.    Intensity THRR unchanged      Progression   Progression Continue to progress workloads to maintain intensity without signs/symptoms of physical distress.    Average METs 2.07      Resistance Training   Training Prescription Yes    Weight 2 lb    Reps 10-15      Interval Training   Interval Training No      Treadmill   MPH 1    Grade 0    Minutes 15    METs 1.77      NuStep  Level 1    Minutes 15      REL-XR   Level 1    Minutes 15    METs 1.3      Track   Laps 35    Minutes 15    METs 2.9      Home Exercise Plan   Plans to continue exercise at Home (comment)   walking, possibly looking into [employer]   Frequency Add 2 additional days to program exercise sessions.    Initial Home Exercises Provided 03/18/23      Oxygen   Maintain Oxygen Saturation 88% or higher             Nutrition:  Target Goals: Understanding of  nutrition guidelines, daily intake of sodium 1500mg , cholesterol 200mg , calories 30% from fat and 7% or less from saturated fats, daily to have 5 or more servings of fruits and vegetables.  Education: All About Nutrition: -Group instruction provided by verbal, written material, interactive activities, discussions, models, and posters to present general guidelines for heart healthy nutrition including fat, fiber, MyPlate, the role of sodium in heart healthy nutrition, utilization of the nutrition label, and utilization of this knowledge for meal planning. Follow up email sent as well. Written material given at graduation.   Biometrics:  Pre Biometrics - 01/28/23 1716       Pre Biometrics   Height 5\' 3"  (1.6 m)    Weight 132 lb 1.6 oz (59.9 kg)    Waist Circumference 33 inches    Hip Circumference 36.5 inches    Waist to Hip Ratio 0.9 %    BMI (Calculated) 23.41    Single Leg Stand 7.09 seconds              Nutrition Therapy Plan and Nutrition Goals:  Nutrition Therapy & Goals - 01/28/23 1554       Nutrition Therapy   Diet Heart healthy, low Na, T2DM MNT    Drug/Food Interactions Statins/Certain Fruits;Coumadin/Vit K    Protein (specify units) 75g    Fiber 25 grams    Whole Grain Foods 3 servings    Saturated Fats 12 max. grams    Fruits and Vegetables 8 servings/day    Sodium 2 grams      Personal Nutrition Goals   Nutrition Goal ST: compare 3-4 days of eating with list of foods high in vitamin K to see how much you are eating, read/compare food labels, review paperwork LT: manage INR, maintain A1C <7, limit sodium <2g/day, follow MyPlate guidelines    Comments 72 y.o. F admitted to cardiac rehab for chronic systolic CHF. PMHx includes CAD, HLD, anemia, stroke, severe mitral insufficiency, severe mitral regurgitation, T2DM, osteopenia, anxiety/depression. Medications reviewed atorvastatin, jardiance, furosemide, potassium-chloride, trazodone, warfarin. B: [place] sausage and  eggs D: chicken or fish or beef with a vegetables. She reports using butter, she will use seasoning salt with her meat. She may also have cold cuts. S:she reports having a sweet tooth - she will have a cookie or 1/2 cup of ice cream and she may have reeses PB cup. Drinks: water and diet coke (not even a whole bottle per day and sweet tea (1/4 cup usually). Discussed heart healthy eating and diabetes friendly eating. Arabella would like to work on limiting sodium - reviewed label reading; she feels confident in label reading. Discussed warfarin and vitamin K consistency.      Intervention Plan   Intervention Prescribe, educate and counsel regarding individualized specific dietary modifications aiming towards targeted  core components such as weight, hypertension, lipid management, diabetes, heart failure and other comorbidities.;Nutrition handout(s) given to patient.    Expected Outcomes Short Term Goal: Understand basic principles of dietary content, such as calories, fat, sodium, cholesterol and nutrients.;Short Term Goal: A plan has been developed with personal nutrition goals set during dietitian appointment.;Long Term Goal: Adherence to prescribed nutrition plan.             Nutrition Assessments:  MEDIFICTS Score Key: ?70 Need to make dietary changes  40-70 Heart Healthy Diet ? 40 Therapeutic Level Cholesterol Diet  Flowsheet Row Cardiac Rehab from 02/05/2023 in [facility] Cardiac and Pulmonary Rehab  Picture Your Plate Total Score on Admission 51      Picture Your Plate Scores: <16 Unhealthy dietary pattern with much room for improvement. 41-50 Dietary pattern unlikely to meet recommendations for good health and room for improvement. 51-60 More healthful dietary pattern, with some room for improvement.  >60 Healthy dietary pattern, although there may be some specific behaviors that could be improved.    Nutrition Goals Re-Evaluation:  Nutrition Goals Re-Evaluation     Row Name 03/18/23  1617 03/27/23 1552 04/08/23 1613 06/10/23 1632       Goals   Current Weight -- -- 136 lb (61.7 kg) --    Nutrition Goal ST: compare 3-4 days of eating with list of foods high in vitamin K to see how much you are eating, read/compare food labels, review paperwork LT: manage INR, maintain A1C <7, limit sodium <2g/day, follow MyPlate guidelines -- Eat more --    Comment Melanie Ray states she has not really read or paid attention to her Vitamin K intake. Encouraged to start keeping track of the foods she eats (per RD goals take 3-4 days worth of eating) and advised her to discuss with her MD as our RD is not in her role anymore to review how much she should be having. Staff to review education with patient as well  so patient has a better understanding on importance. Provide a handout. She is reading labels for her sodium intake and staying mindful on what she is eating and how much she is eating Per encounter dated from 4/9- patient is not taking warfarin anymore and therefore does not need to monitor Vitamin K levels.  RN reviewed education and information with patient and confirmed medication list. New goals can be established by new RD for further nutrition goals if needed. Nothings is tasting good for her anymore and she has had a poor appetite since she is taking care of her husband. She is not intrested in meeting with the dietitian. Melanie Ray reports that she continues to have a poor appetite. She did state that she will work on eliminating sodium from her diet and will check food labels. She has deferred to meet with the RD at this time.    Expected Outcome Short: Understand Vitamin K education involved with her diet, staff to review more information. Patient to start keeping log of food for next 3-4 days per RD goal Long: Continue to follow guidelines by RD and eat a heart healthy diet Short: Establish new goals by new RD if needed Long: Continue to eat heart healthy diet Short: try to eat more when able.  Long: maintain a diet that adheres to her. Short: Continue to try to eat more when able. Long: Practice Heart-healthy eating patterns.             Nutrition Goals Discharge (Final Nutrition Goals Re-Evaluation):  Nutrition Goals Re-Evaluation - 06/10/23 1632       Goals   Comment Melanie Ray reports that she continues to have a poor appetite. She did state that she will work on eliminating sodium from her diet and will check food labels. She has deferred to meet with the RD at this time.    Expected Outcome Short: Continue to try to eat more when able. Long: Practice Heart-healthy eating patterns.             Psychosocial: Target Goals: Acknowledge presence or absence of significant depression and/or stress, maximize coping skills, provide positive support system. Participant is able to verbalize types and ability to use techniques and skills needed for reducing stress and depression.   Education: Stress, Anxiety, and Depression - Group verbal and visual presentation to define topics covered.  Reviews how body is impacted by stress, anxiety, and depression.  Also discusses healthy ways to reduce stress and to treat/manage anxiety and depression.  Written material given at graduation.   Education: Sleep Hygiene -Provides group verbal and written instruction about how sleep can affect your health.  Define sleep hygiene, discuss sleep cycles and impact of sleep habits. Review good sleep hygiene tips.    Initial Review & Psychosocial Screening:  Initial Psych Review & Screening - 12/26/22 1345       Initial Review   Current issues with History of Depression;Current Depression;Current Anxiety/Panic      Family Dynamics   Good Support System? Yes    Comments Her depression and anxiety does not stem from any one thing. Melanie Ray can look to her sister, brother and husband. She is ready to get her health on track and start rehab.      Barriers   Psychosocial barriers to participate in  program The patient should benefit from training in stress management and relaxation.      Screening Interventions   Interventions Encouraged to exercise;Provide feedback about the scores to participant;To provide support and resources with identified psychosocial needs    Expected Outcomes Short Term goal: Utilizing psychosocial counselor, staff and physician to assist with identification of specific Stressors or current issues interfering with healing process. Setting desired goal for each stressor or current issue identified.;Long Term Goal: Stressors or current issues are controlled or eliminated.;Short Term goal: Identification and review with participant of any Quality of Life or Depression concerns found by scoring the questionnaire.;Long Term goal: The participant improves quality of Life and PHQ9 Scores as seen by post scores and/or verbalization of changes             Quality of Life Scores:   Quality of Life - 02/05/23 1016       Quality of Life   Select Quality of Life      Quality of Life Scores   Health/Function Pre 16.5 %    Socioeconomic Pre 29.14 %    Psych/Spiritual Pre 16 %    Family Pre 26 %    GLOBAL Pre 20.57 %            Scores of 19 and below usually indicate a poorer quality of life in these areas.  A difference of  2-3 points is a clinically meaningful difference.  A difference of 2-3 points in the total score of the Quality of Life Index has been associated with significant improvement in overall quality of life, self-image, physical symptoms, and general health in studies assessing change in quality of life.  PHQ-9: Review Flowsheet  More data  exists      05/30/2023 05/13/2023 04/22/2023 04/08/2023 03/20/2023  Depression screen PHQ 2/9  Decreased Interest 2 2 2 3 1   Down, Depressed, Hopeless 2 2 2 3 1   PHQ - 2 Score 4 4 4 6 2   Altered sleeping - 3 1 1 1   Tired, decreased energy 3 2 2 1 2   Change in appetite 1 2 1 1  0  Feeling bad or failure about  yourself  2 2 2  0 1  Trouble concentrating 1 1 0 0 0  Moving slowly or fidgety/restless 0 1 0 0 1  Suicidal thoughts 0 3 3 1 1   PHQ-9 Score - 18 13 10 8   Difficult doing work/chores - Somewhat difficult Somewhat difficult Somewhat difficult Somewhat difficult    Details           Interpretation of Total Score  Total Score Depression Severity:  1-4 = Minimal depression, 5-9 = Mild depression, 10-14 = Moderate depression, 15-19 = Moderately severe depression, 20-27 = Severe depression   Psychosocial Evaluation and Intervention:  Psychosocial Evaluation - 12/26/22 1349       Psychosocial Evaluation & Interventions   Interventions Encouraged to exercise with the program and follow exercise prescription;Relaxation education;Stress management education    Comments Her depression and anxiety does not stem from any one thing. Shatora can look to her sister, brother and husband. She is ready to get her health on track and start rehab.    Expected Outcomes Short: Start [facility] to help with mood. Long: Maintain a healthy mental state    Continue Psychosocial Services  Follow up required by staff             Psychosocial Re-Evaluation:  Psychosocial Re-Evaluation     Row Name 03/18/23 1621 04/08/23 1610 06/10/23 1629         Psychosocial Re-Evaluation   Current issues with Current Depression;Current Psychotropic Meds Current Depression;Current Psychotropic Meds Current Depression;Current Psychotropic Meds     Comments Melanie Ray states she is doing well mentally. She takes Trazadone before her sleep which helps her. She just came back from a trip from [place]- her sister just got doctor in education. She is taking all of her medications appropriately and states she has no extra stress, anxiety, or depression symptoms at this time. She is enjoying coming to rehab. Encouraged her to reach out should anything change. Reviewed patient health questionnaire (PHQ-9) with patient for follow up.  Previously, patients score indicated signs/symptoms of depression.  Reviewed to see if patient is improving symptom wise while in program.  Score declined and patient states that it is because she takes care of her husband who is in a facility and her dog requires alot of attention. She has no plans to hurt herself at this time. She states its stems more from the stress of life and that it would not be there if she were to pass. Melanie Ray reports that she continues to deal with some stress and anxiety related to being a caregiver for her husband. However, she does report that she is managing her stress better and relieves stress by reading and going to lunch with friends. Exercise has also been a good stress reliever for her.     Expected Outcomes Short: Continue coming to rehab for mood boost Long: Continue to maintain positive attitude and utilize exercise for stress management Short: Continue to work toward an improvement in PHQ9 scores by attending [facility] regularly. Long: Continue to improve stress and depression coping skills  by talking with staff and attending [facility] regularly and work toward a positive mental state. Short: Continue to exercise for mental boost. Long: Continue to maintain positive outlook.     Interventions Encouraged to attend Cardiac Rehabilitation for the exercise Encouraged to attend Cardiac Rehabilitation for the exercise Encouraged to attend Cardiac Rehabilitation for the exercise     Continue Psychosocial Services  Follow up required by staff Follow up required by staff Follow up required by staff              Psychosocial Discharge (Final Psychosocial Re-Evaluation):  Psychosocial Re-Evaluation - 06/10/23 1629       Psychosocial Re-Evaluation   Current issues with Current Depression;Current Psychotropic Meds    Comments Melanie Ray reports that she continues to deal with some stress and anxiety related to being a caregiver for her husband. However,  she does report that she is managing her stress better and relieves stress by reading and going to lunch with friends. Exercise has also been a good stress reliever for her.    Expected Outcomes Short: Continue to exercise for mental boost. Long: Continue to maintain positive outlook.    Interventions Encouraged to attend Cardiac Rehabilitation for the exercise    Continue Psychosocial Services  Follow up required by staff             Vocational Rehabilitation: Provide vocational rehab assistance to qualifying candidates.   Vocational Rehab Evaluation & Intervention:   Education: Education Goals: Education classes will be provided on a variety of topics geared toward better understanding of heart health and risk factor modification. Participant will state understanding/return demonstration of topics presented as noted by education test scores.  Learning Barriers/Preferences:  Learning Barriers/Preferences - 12/26/22 1342       Learning Barriers/Preferences   Learning Barriers None    Learning Preferences None             General Cardiac Education Topics:  AED/CPR: - Group verbal and written instruction with the use of models to demonstrate the basic use of the AED with the basic ABC's of resuscitation.   Anatomy and Cardiac Procedures: - Group verbal and visual presentation and models provide information about basic cardiac anatomy and function. Reviews the testing methods done to diagnose heart disease and the outcomes of the test results. Describes the treatment choices: Medical Management, Angioplasty, or Coronary Bypass Surgery for treating various heart conditions including Myocardial Infarction, Angina, Valve Disease, and Cardiac Arrhythmias.  Written material given at graduation.   Medication Safety: - Group verbal and visual instruction to review commonly prescribed medications for heart and lung disease. Reviews the medication, class of the drug, and side  effects. Includes the steps to properly store meds and maintain the prescription regimen.  Written material given at graduation.   Intimacy: - Group verbal instruction through game format to discuss how heart and lung disease can affect sexual intimacy. Written material given at graduation..   Know Your Numbers and Heart Failure: - Group verbal and visual instruction to discuss disease risk factors for cardiac and pulmonary disease and treatment options.  Reviews associated critical values for Overweight/Obesity, Hypertension, Cholesterol, and Diabetes.  Discusses basics of heart failure: signs/symptoms and treatments.  Introduces Heart Failure Zone chart for action plan for heart failure.  Written material given at graduation.   Infection Prevention: - Provides verbal and written material to individual with discussion of infection control including proper hand washing and proper equipment cleaning during exercise session. Flowsheet Row Cardiac Rehab  from 01/28/2023 in [facility] Cardiac and Pulmonary Rehab  Date 01/28/23  Educator [facility]  Instruction Review Code 1- Verbalizes Understanding       Falls Prevention: - Provides verbal and written material to individual with discussion of falls prevention and safety. Flowsheet Row Cardiac Rehab from 01/28/2023 in [facility] Cardiac and Pulmonary Rehab  Date 01/28/23  Educator [facility]  Instruction Review Code 1- Verbalizes Understanding       Other: -Provides group and verbal instruction on various topics (see comments)   Knowledge Questionnaire Score:  Knowledge Questionnaire Score - 02/05/23 1016       Knowledge Questionnaire Score   Pre Score 22/26             Core Components/Risk Factors/Patient Goals at Admission:  Personal Goals and Risk Factors at Admission - 01/28/23 1716       Core Components/Risk Factors/Patient Goals on Admission    Weight Management Yes;Weight Maintenance    Intervention Weight Management: Develop a combined  nutrition and exercise program designed to reach desired caloric intake, while maintaining appropriate intake of nutrient and fiber, sodium and fats, and appropriate energy expenditure required for the weight goal.;Weight Management: Provide education and appropriate resources to help participant work on and attain dietary goals.;Weight Management/Obesity: Establish reasonable short term and long term weight goals.    Admit Weight 132 lb 1.6 oz (59.9 kg)    Goal Weight: Short Term 132 lb (59.9 kg)    Goal Weight: Long Term 132 lb (59.9 kg)    Expected Outcomes Short Term: Continue to assess and modify interventions until short term weight is achieved;Long Term: Adherence to nutrition and physical activity/exercise program aimed toward attainment of established weight goal;Weight Maintenance: Understanding of the daily nutrition guidelines, which includes 25-35% calories from fat, 7% or less cal from saturated fats, less than 200mg  cholesterol, less than 1.5gm of sodium, & 5 or more servings of fruits and vegetables daily;Understanding recommendations for meals to include 15-35% energy as protein, 25-35% energy from fat, 35-60% energy from carbohydrates, less than 200mg  of dietary cholesterol, 20-35 gm of total fiber daily;Understanding of distribution of calorie intake throughout the day with the consumption of 4-5 meals/snacks    Diabetes Yes    Intervention Provide education about signs/symptoms and action to take for hypo/hyperglycemia.;Provide education about proper nutrition, including hydration, and aerobic/resistive exercise prescription along with prescribed medications to achieve blood glucose in normal ranges: Fasting glucose 65-99 mg/dL    Expected Outcomes Short Term: Participant verbalizes understanding of the signs/symptoms and immediate care of hyper/hypoglycemia, proper foot care and importance of medication, aerobic/resistive exercise and nutrition plan for blood glucose control.;Long Term:  Attainment of HbA1C < 7%.    Heart Failure Yes    Intervention Provide a combined exercise and nutrition program that is supplemented with education, support and counseling about heart failure. Directed toward relieving symptoms such as shortness of breath, decreased exercise tolerance, and extremity edema.    Expected Outcomes Improve functional capacity of life;Short term: Attendance in program 2-3 days a week with increased exercise capacity. Reported lower sodium intake. Reported increased fruit and vegetable intake. Reports medication compliance.;Long term: Adoption of self-care skills and reduction of barriers for early signs and symptoms recognition and intervention leading to self-care maintenance.;Short term: Daily weights obtained and reported for increase. Utilizing diuretic protocols set by physician.    Hypertension Yes    Intervention Provide education on lifestyle modifcations including regular physical activity/exercise, weight management, moderate sodium restriction and increased consumption of fresh  fruit, vegetables, and low fat dairy, alcohol moderation, and smoking cessation.;Monitor prescription use compliance.    Expected Outcomes Short Term: Continued assessment and intervention until BP is < 140/17mm HG in hypertensive participants. < 130/61mm HG in hypertensive participants with diabetes, heart failure or chronic kidney disease.;Long Term: Maintenance of blood pressure at goal levels.    Lipids Yes    Intervention Provide education and support for participant on nutrition & aerobic/resistive exercise along with prescribed medications to achieve LDL 70mg , HDL >40mg .    Expected Outcomes Short Term: Participant states understanding of desired cholesterol values and is compliant with medications prescribed. Participant is following exercise prescription and nutrition guidelines.;Long Term: Cholesterol controlled with medications as prescribed, with individualized exercise RX and with  personalized nutrition plan. Value goals: LDL < 70mg , HDL > 40 mg.             Education:Diabetes - Individual verbal and written instruction to review signs/symptoms of diabetes, desired ranges of glucose level fasting, after meals and with exercise. Acknowledge that pre and post exercise glucose checks will be done for 3 sessions at entry of program. Flowsheet Row Cardiac Rehab from 01/28/2023 in [facility] Cardiac and Pulmonary Rehab  Date 01/28/23  Educator [facility]  Instruction Review Code 1- Verbalizes Understanding       Core Components/Risk Factors/Patient Goals Review:   Goals and Risk Factor Review     Row Name 03/18/23 1619 04/08/23 1619 06/10/23 1633         Core Components/Risk Factors/Patient Goals Review   Personal Goals Review Weight Management/Obesity;Heart Failure;Diabetes;Hypertension Hypertension Hypertension     Review Melanie Ray states she is maintaining her weight between 130-135 lb. She knows to look for any potential fluid gain with a sudden weight gain. Denies any heart failure symptoms at this time. Does not have a BP cuff but encouraged to get one so she can monitor her BP at home. She is diet controlled for her diabetes and sees PCP this Wednesday, 5/15. and may get A1C checked then. Melanie Ray blood pressure has been doing well. She does not check her blood pressure at home and informed her to get one if she was able to. Explained why it is important to take blood pressure at home while taking medicaions. Melanie Ray has not been checking her BP at home and does not own a BP cuff. She states that she will look into seeing if insurance will cover her getting a BP cuff at home. Her pressures have stayed within normal ranges while at cardiac rehab.     Expected Outcomes Short: See PCP on Wednesday. buy BP cuff for at home monitoring Long: Continue to manage lifestyle risk factors Short: obtain a blood pressure cuff. Long: maintain blood pressure readings at home. Short: Obtain a blood  pressure cuff. Long: Maintain blood pressure readings at home.              Core Components/Risk Factors/Patient Goals at Discharge (Final Review):   Goals and Risk Factor Review - 06/10/23 1633       Core Components/Risk Factors/Patient Goals Review   Personal Goals Review Hypertension    Review Melanie Ray has not been checking her BP at home and does not own a BP cuff. She states that she will look into seeing if insurance will cover her getting a BP cuff at home. Her pressures have stayed within normal ranges while at cardiac rehab.    Expected Outcomes Short: Obtain a blood pressure cuff. Long: Maintain blood pressure readings at  home.             ITP Comments:  ITP Comments     Row Name 12/26/22 1341 01/28/23 1647 02/05/23 1006 02/20/23 1428 03/20/23 1204   ITP Comments Virtual Visit completed. Patient informed on EP and RD appointment and 6 Minute walk test. Patient also informed of patient health questionnaires on My Chart. Patient Verbalizes understanding. Visit diagnosis can be found in [facility] 10/24/[facility]22. Completed and gym orientation. Initial ITP created and sent for review to Dr. Daniel Nones, Medical Director. First full day of exercise!  Patient was oriented to gym and equipment including functions, settings, policies, and procedures.  Patient's individual exercise prescription and treatment plan were reviewed.  All starting workloads were established based on the results of the 6 minute walk test done at initial orientation visit.  The plan for exercise progression was also introduced and progression will be customized based on patient's performance and goals. 30 day review completed. ITP sent to Dr. Bethann Punches, Medical Director of Cardiac Rehab. Continue with ITP unless changes are made by physician. 30 Day review completed. Medical Director ITP review done, changes made as directed, and signed approval by Medical Director.    Row Name 03/27/23 1904 04/17/23 1140 05/14/23  1513 06/11/23 1139 06/12/23 1210   ITP Comments Per encounter dated from 4/9- patient is not taking warfarin anymore.  RN reviewed education with patient and confirmed medication list. New goals can be established by new RD for further nutrition goals if needed. 30 Day review completed. Medical Director ITP review done, changes made as directed, and signed approval by Medical Director. 30 Day review completed. Medical Director ITP review done, changes made as directed, and signed approval by Medical Director. Sporadic attendance, able to review goals. 30 Day review completed. Medical Director ITP review done, changes made as directed, and signed approval by Medical Director.            Comments:

## 2023-06-17 ENCOUNTER — Encounter: Payer: Self-pay | Admitting: *Deleted

## 2023-06-17 ENCOUNTER — Telehealth: Payer: Self-pay | Admitting: *Deleted

## 2023-06-17 NOTE — Patient Outreach (Signed)
  Care Coordination   06/17/2023 Name: Melanie Ray MRN: [id number] DOB: Apr 08, 1951   Care Coordination Outreach Attempts:  An unsuccessful telephone outreach was attempted for a scheduled appointment today.  Follow Up Plan:  Additional outreach attempts will be made to offer the patient care coordination information and services.   Encounter Outcome:  No Answer   Care Coordination Interventions:  No, not indicated     , LCSW Clinical Social Worker  [facility] Care Management [phone]

## 2023-06-18 ENCOUNTER — Telehealth: Payer: Self-pay | Admitting: *Deleted

## 2023-06-18 NOTE — Patient Outreach (Signed)
  Care Coordination   06/18/2023 Name: Melanie Ray MRN: [id number] DOB: [birth year]   Care Coordination Outreach Attempts:  A second unsuccessful outreach was attempted today to offer the patient with information about available care coordination services.  Follow Up Plan:  Additional outreach attempts will be made to offer the patient care coordination information and services.   Encounter Outcome:  No Answer   Care Coordination Interventions:  No, not indicated     , LCSW Clinical Social Worker  [facility] Care Management [phone]

## 2023-06-19 ENCOUNTER — Encounter: Payer: Medicare Other | Admitting: *Deleted

## 2023-06-19 DIAGNOSIS — Z952 Presence of prosthetic heart valve: Secondary | ICD-10-CM | POA: Diagnosis not present

## 2023-06-19 DIAGNOSIS — Z48812 Encounter for surgical aftercare following surgery on the circulatory system: Secondary | ICD-10-CM | POA: Diagnosis not present

## 2023-06-19 NOTE — Progress Notes (Signed)
Daily Session Note  Patient Details  Name: Melanie Ray MRN: [id number] Date of Birth: Nov 20, 1950 Referring Provider:   Flowsheet Row Cardiac Rehab from 01/28/2023 in [facility] Cardiac and Pulmonary Rehab  Referring Provider Excell Seltzer       Encounter Date: 06/19/2023  Check In:  Session Check In - 06/19/23 1608       Check-In   Supervising physician immediately available to respond to emergencies See telemetry face sheet for immediately available ER MD    Location [facility]-Cardiac & Pulmonary Rehab    Staff Present Hulen Luster, BS, RRT, CPFT;Megan Katrinka Blazing, RN, ADN; Jewel Baize, RN BSN    Virtual Visit No    Medication changes reported     No    Fall or balance concerns reported    No    Warm-up and Cool-down Performed on first and last piece of equipment    Resistance Training Performed Yes    VAD Patient? No      Pain Assessment   Currently in Pain? No/denies                Social History   Tobacco Use  Smoking Status Former   Current packs/day: 0.00   Average packs/day: 0.5 packs/day for 25.0 years (12.5 ttl pk-yrs)   Types: Cigarettes   Start date: 04/13/1972   Quit date: 04/13/1997   Years since quitting: 26.2  Smokeless Tobacco Never    Goals Met:  Independence with exercise equipment Exercise tolerated well No report of concerns or symptoms today Strength training completed today  Goals Unmet:  Not Applicable  Comments: Pt able to follow exercise prescription today without complaint.  Will continue to monitor for progression.    Dr. Bethann Punches is Medical Director for [facility] Cardiac Rehabilitation.  Dr. Vida Rigger is Medical Director for [facility] Pulmonary Rehabilitation.

## 2023-06-21 ENCOUNTER — Ambulatory Visit: Payer: Medicare Other | Admitting: Family Medicine

## 2023-06-24 ENCOUNTER — Encounter: Payer: Medicare Other | Admitting: *Deleted

## 2023-06-24 DIAGNOSIS — S0302XA Dislocation of jaw, left side, initial encounter: Secondary | ICD-10-CM | POA: Diagnosis not present

## 2023-06-24 DIAGNOSIS — Z952 Presence of prosthetic heart valve: Secondary | ICD-10-CM

## 2023-06-24 NOTE — Progress Notes (Signed)
Incomplete Session Note   Melanie Ray was unable to complete her exercise session today. She told staff that she felt like her jaw was locking up and it hurt to swallow. She noted that these symptoms started yesterday along with feeling tired. Her BP was 132/70 and after walking for 10 minutes was 140/72 when she felt like she couldn't do anymore. She stated she did not have any chest discomfort today or yesterday. She felt like she was coming down with something and wanted to go to her doctor. Staff escorted her to the walk in clinic to be seen today.

## 2023-06-25 DIAGNOSIS — F39 Unspecified mood [affective] disorder: Secondary | ICD-10-CM | POA: Diagnosis not present

## 2023-06-25 DIAGNOSIS — F339 Major depressive disorder, recurrent, unspecified: Secondary | ICD-10-CM | POA: Diagnosis not present

## 2023-06-25 DIAGNOSIS — G3184 Mild cognitive impairment, so stated: Secondary | ICD-10-CM | POA: Diagnosis not present

## 2023-06-26 ENCOUNTER — Encounter: Payer: Medicare Other | Admitting: *Deleted

## 2023-06-26 DIAGNOSIS — Z952 Presence of prosthetic heart valve: Secondary | ICD-10-CM | POA: Diagnosis not present

## 2023-06-26 DIAGNOSIS — Z48812 Encounter for surgical aftercare following surgery on the circulatory system: Secondary | ICD-10-CM | POA: Diagnosis not present

## 2023-06-26 NOTE — Progress Notes (Signed)
Daily Session Note  Patient Details  Name: Melanie Ray MRN: [id number] Date of Birth: [birth year] Referring Provider:   Flowsheet Row Cardiac Rehab from 01/28/2023 in [facility] Cardiac and Pulmonary Rehab  Referring Provider Excell Seltzer       Encounter Date: 06/26/2023  Check In:  Session Check In - 06/26/23 1530       Check-In   Supervising physician immediately available to respond to emergencies See telemetry face sheet for immediately available ER MD    Location [facility]-Cardiac & Pulmonary Rehab    Staff Present Lanny Hurst, RN, ADN;Tyrel Lex Jewel Baize, RN BSN;Susanne Bice, RN, BSN, CCRP    Virtual Visit No    Medication changes reported     No    Fall or balance concerns reported    No    Warm-up and Cool-down Performed on first and last piece of equipment    Resistance Training Performed Yes    VAD Patient? No    PAD/SET Patient? No      Pain Assessment   Currently in Pain? No/denies                Social History   Tobacco Use  Smoking Status Former   Current packs/day: 0.00   Average packs/day: 0.5 packs/day for 25.0 years (12.5 ttl pk-yrs)   Types: Cigarettes   Start date: 04/13/1972   Quit date: 04/13/1997   Years since quitting: 26.2  Smokeless Tobacco Never    Goals Met:  Independence with exercise equipment Exercise tolerated well No report of concerns or symptoms today Strength training completed today  Goals Unmet:  Not Applicable  Comments: Pt able to follow exercise prescription today without complaint.  Will continue to monitor for progression.    Dr. Bethann Punches is Medical Director for [facility] Cardiac Rehabilitation.  Dr. Vida Rigger is Medical Director for [facility] Pulmonary Rehabilitation.

## 2023-06-27 ENCOUNTER — Telehealth: Payer: Self-pay | Admitting: *Deleted

## 2023-06-27 NOTE — Patient Outreach (Signed)
  Care Coordination   06/27/2023 Name: Melanie Ray MRN: [id number] DOB: 1951/05/02   Care Coordination Outreach Attempts:  A third unsuccessful outreach was attempted today to offer the patient with information about available care coordination services.  Follow Up Plan:  No further outreach attempts will be made at this time. We have been unable to contact the patient to offer or enroll patient in care coordination services  Encounter Outcome:  No Answer   Care Coordination Interventions:  No, not indicated    Derico Mitton, LCSW Clinical Social Worker  [facility] Care Management [phone]

## 2023-07-01 ENCOUNTER — Encounter: Payer: Medicare Other | Admitting: *Deleted

## 2023-07-01 DIAGNOSIS — Z952 Presence of prosthetic heart valve: Secondary | ICD-10-CM

## 2023-07-01 DIAGNOSIS — Z48812 Encounter for surgical aftercare following surgery on the circulatory system: Secondary | ICD-10-CM | POA: Diagnosis not present

## 2023-07-01 DIAGNOSIS — I5022 Chronic systolic (congestive) heart failure: Secondary | ICD-10-CM

## 2023-07-01 NOTE — Progress Notes (Signed)
Daily Session Note  Patient Details  Name: Melanie Ray MRN: [id number] Date of Birth: [birth year] Referring Provider:   Flowsheet Row Cardiac Rehab from 01/28/2023 in [facility] Cardiac and Pulmonary Rehab  Referring Provider Excell Seltzer       Encounter Date: 07/01/2023  Check In:  Session Check In - 07/01/23 1550       Check-In   Supervising physician immediately available to respond to emergencies See telemetry face sheet for immediately available ER MD    Location [facility]-Cardiac & Pulmonary Rehab    Staff Present Cyndia Diver, RN, BSN, Cammie Sickle, RCP,RRT,BSRT;Maxon [place] BS, , Exercise Physiologist    Virtual Visit No    Medication changes reported     No    Fall or balance concerns reported    No    Tobacco Cessation No Change    Warm-up and Cool-down Performed on first and last piece of equipment    Resistance Training Performed Yes    VAD Patient? No    PAD/SET Patient? No      Pain Assessment   Currently in Pain? No/denies                Social History   Tobacco Use  Smoking Status Former   Current packs/day: 0.00   Average packs/day: 0.5 packs/day for 25.0 years (12.5 ttl pk-yrs)   Types: Cigarettes   Start date: 04/13/1972   Quit date: 04/13/1997   Years since quitting: 26.2  Smokeless Tobacco Never    Goals Met:  Independence with exercise equipment Exercise tolerated well No report of concerns or symptoms today  Goals Unmet:  Not Applicable  Comments: Pt able to follow exercise prescription today without complaint.  Will continue to monitor for progression.    Dr. Bethann Punches is Medical Director for [facility] Cardiac Rehabilitation.  Dr. Vida Rigger is Medical Director for [facility] Pulmonary Rehabilitation.

## 2023-07-03 ENCOUNTER — Encounter: Payer: Medicare Other | Admitting: *Deleted

## 2023-07-03 DIAGNOSIS — Z952 Presence of prosthetic heart valve: Secondary | ICD-10-CM | POA: Diagnosis not present

## 2023-07-03 DIAGNOSIS — Z48812 Encounter for surgical aftercare following surgery on the circulatory system: Secondary | ICD-10-CM | POA: Diagnosis not present

## 2023-07-03 DIAGNOSIS — I5022 Chronic systolic (congestive) heart failure: Secondary | ICD-10-CM

## 2023-07-03 NOTE — Progress Notes (Signed)
Daily Session Note  Patient Details  Name: Melanie Ray MRN: [id number] Date of Birth: 04-May-1951 Referring Provider:   Flowsheet Row Cardiac Rehab from 01/28/2023 in [facility] Cardiac and Pulmonary Rehab  Referring Provider Excell Seltzer       Encounter Date: 07/03/2023  Check In:  Session Check In - 07/03/23 1557       Check-In   Supervising physician immediately available to respond to emergencies See telemetry face sheet for immediately available ER MD    Location [facility]-Cardiac & Pulmonary Rehab    Staff Present Bess Kinds RN, BSN;Maxon [place] BS, , Exercise Physiologist;Joseph [place], [place]    Virtual Visit No    Medication changes reported     No    Fall or balance concerns reported    No    Warm-up and Cool-down Performed on first and last piece of equipment    Resistance Training Performed Yes    VAD Patient? No    PAD/SET Patient? No      Pain Assessment   Currently in Pain? No/denies                Social History   Tobacco Use  Smoking Status Former   Current packs/day: 0.00   Average packs/day: 0.5 packs/day for 25.0 years (12.5 ttl pk-yrs)   Types: Cigarettes   Start date: 04/13/1972   Quit date: 04/13/1997   Years since quitting: 26.2  Smokeless Tobacco Never    Goals Met:  Independence with exercise equipment Exercise tolerated well No report of concerns or symptoms today Strength training completed today  Goals Unmet:  Not Applicable  Comments: Pt able to follow exercise prescription today without complaint.  Will continue to monitor for progression.'   Dr. Bethann Punches is Medical Director for [facility] Cardiac Rehabilitation.  Dr. Vida Rigger is Medical Director for [facility] Pulmonary Rehabilitation.

## 2023-07-05 ENCOUNTER — Ambulatory Visit (INDEPENDENT_AMBULATORY_CARE_PROVIDER_SITE_OTHER): Payer: Medicare Other

## 2023-07-05 DIAGNOSIS — I447 Left bundle-branch block, unspecified: Secondary | ICD-10-CM

## 2023-07-09 LAB — CUP PACEART REMOTE DEVICE CHECK
Battery Remaining Longevity: 62 mo
Battery Remaining Percentage: 73 %
Battery Voltage: 2.98 V
Brady Statistic AP VP Percent: 93 %
Brady Statistic AP VS Percent: 1 %
Brady Statistic AS VP Percent: 5.9 %
Brady Statistic AS VS Percent: 1 %
Brady Statistic RA Percent Paced: 93 %
Date Time Interrogation Session: 20240831063557
HighPow Impedance: 62 Ohm
HighPow Impedance: 62 Ohm
Implantable Lead Connection Status: 753985
Implantable Lead Connection Status: 753985
Implantable Lead Connection Status: 753985
Implantable Lead Implant Date: 20230228
Implantable Lead Implant Date: 20230228
Implantable Lead Implant Date: 20230228
Implantable Lead Location: 753858
Implantable Lead Location: 753859
Implantable Lead Location: 753860
Implantable Lead Model: 3830
Implantable Pulse Generator Implant Date: 20230228
Lead Channel Impedance Value: 430 Ohm
Lead Channel Impedance Value: 560 Ohm
Lead Channel Impedance Value: 560 Ohm
Lead Channel Pacing Threshold Amplitude: 0.5 V
Lead Channel Pacing Threshold Amplitude: 0.5 V
Lead Channel Pacing Threshold Amplitude: 0.75 V
Lead Channel Pacing Threshold Pulse Width: 0.5 ms
Lead Channel Pacing Threshold Pulse Width: 0.5 ms
Lead Channel Pacing Threshold Pulse Width: 0.5 ms
Lead Channel Sensing Intrinsic Amplitude: 12 mV
Lead Channel Sensing Intrinsic Amplitude: 4.1 mV
Lead Channel Setting Pacing Amplitude: 2 V
Lead Channel Setting Pacing Amplitude: 2 V
Lead Channel Setting Pacing Amplitude: 2 V
Lead Channel Setting Pacing Pulse Width: 0.5 ms
Lead Channel Setting Pacing Pulse Width: 0.5 ms
Lead Channel Setting Sensing Sensitivity: 0.5 mV
Pulse Gen Serial Number: 8904264
Zone Setting Status: 755011

## 2023-07-09 NOTE — Progress Notes (Signed)
Remote ICD transmission.   

## 2023-07-10 ENCOUNTER — Encounter: Payer: Self-pay | Admitting: *Deleted

## 2023-07-10 ENCOUNTER — Encounter: Payer: Medicare Other | Attending: Cardiovascular Disease

## 2023-07-10 ENCOUNTER — Telehealth: Payer: Self-pay

## 2023-07-10 DIAGNOSIS — I5022 Chronic systolic (congestive) heart failure: Secondary | ICD-10-CM | POA: Insufficient documentation

## 2023-07-10 DIAGNOSIS — Z952 Presence of prosthetic heart valve: Secondary | ICD-10-CM | POA: Insufficient documentation

## 2023-07-10 NOTE — Progress Notes (Signed)
Cardiac Individual Treatment Plan  Patient Details  Name: Treniece Kohlhoff MRN: [id number] Date of Birth: [birth year] Referring Provider:   Flowsheet Row Cardiac Rehab from 01/28/2023 in [facility] Cardiac and Pulmonary Rehab  Referring Provider Excell Seltzer       Initial Encounter Date:  Flowsheet Row Cardiac Rehab from 01/28/2023 in [facility] Cardiac and Pulmonary Rehab  Date 01/28/23       Visit Diagnosis: S/P mitral valve replacement  Patient's Home Medications on Admission:  Current Outpatient Medications:    aspirin EC 81 MG tablet, Take 1 tablet (81 mg total) by mouth daily. Swallow whole., Disp: 30 tablet, Rfl: 12   atorvastatin (LIPITOR) 80 MG tablet, Take 1 tablet (80 mg total) by mouth daily., Disp: 90 tablet, Rfl: 3   carvedilol (COREG) 12.5 MG tablet, Take 0.5 tablets (6.25 mg total) by mouth 2 (two) times daily with a meal., Disp: 60 tablet, Rfl: 1   dorzolamide-timolol (COSOPT) 22.3-6.8 MG/ML ophthalmic solution, Place 1 drop into both eyes 2 (two) times daily. , Disp: , Rfl:    furosemide (LASIX) 40 MG tablet, Take 40 mg by mouth as needed. (Patient not taking: Reported on 05/24/2023), Disp: , Rfl:    gabapentin (NEURONTIN) 400 MG capsule, Take 1 capsule (400 mg total) by mouth 2 (two) times daily., Disp: 60 capsule, Rfl: 3   isosorbide mononitrate (IMDUR) 30 MG 24 hr tablet, Take 30 mg by mouth daily., Disp: , Rfl:    latanoprost (XALATAN) 0.005 % ophthalmic solution, Place 1 drop into both eyes at bedtime., Disp: , Rfl:    memantine (NAMENDA) 5 MG tablet, Take 5 mg by mouth daily. (Patient not taking: Reported on 05/31/2023), Disp: , Rfl:    mirtazapine (REMERON) 7.5 MG tablet, Take 1 tablet (7.5 mg total) by mouth at bedtime., Disp: 30 tablet, Rfl: 2   QUEtiapine (SEROQUEL) 50 MG tablet, Take 1-3 tabs nightly for sleep., Disp: , Rfl:    spironolactone (ALDACTONE) 25 MG tablet, Take 0.5 tablets (12.5 mg total) by mouth daily. (Patient not taking: Reported on 05/24/2023), Disp: 45  tablet, Rfl: 3   traZODone (DESYREL) 150 MG tablet, Take by mouth at bedtime as needed for sleep., Disp: , Rfl:    valsartan (DIOVAN) 80 MG tablet, Take 80 mg by mouth daily., Disp: , Rfl:   Past Medical History: Past Medical History:  Diagnosis Date   ABLA (acute blood loss anemia) 10/24/2022   AICD (automatic cardioverter/defibrillator) present    Allergic rhinitis 09/25/2022   Back pain    Benign neoplasm of colon 04/27/2008   CAD (coronary artery disease) 04/11/2016   S/p ant STEMI 5/17: LHC >> LAD proximal 80%, mid 80%, distal 50%, ostial D1 60%; LCx with LPDA lesion 30%; RCA Mild calcification with no significant stenosis in a medium caliber, nondominant RCA; LVEF is estimated at 45% with inferoapical and lateral wall akinesis >> PCI: PCI: 3.5 x 24 mm Promus DES to prox LAD, 2.5 x 12 mm Promus DES to mid LAD.   Cerebellar stroke 03/30/2020   2021 MRI - Small, old left cerebellar infarct    Chest pain    Chronic HFrEF (heart failure with reduced ejection fraction) 10/25/2022   Chronic systolic CHF (congestive heart failure) 03/21/2016   Echo 01/30/17: Diff HK, mild focal basal septal hypertrophy, EF 30-35, mild AI, MAC, mild MR // Echo 06/08/16: Mild focal basal septal hypertrophy, EF 25-30%, diff HK, ant-septal AK, Gr 1 DD, mild AI, MAC, mild MR, PASP 37 mmHg // Echo 03/18/16: EF  30-35%, ant-septal AK, Gr 1 DD, mild MR, severe LAE.     Constipation    Diarrhea 01/02/2023   Dizziness    Dyspnea    Endotracheal tube present 10/24/2022   Essential hypertension 04/27/2008   Well-controlled today.  She will continue valsartan 80 mg twice a day, Imdur 30 mg daily, Lasix 20 mg every other day, [name] 10 mg daily, and carvedilol 12.5 mg twice daily.   Finger fracture 08/15/2021   She reports this is well-healing.  She will continue to see orthopedics.   Generalized anxiety disorder 04/27/2008   Glaucoma    Gout    History of acute anterior wall MI 03/17/2016   History of colonoscopy  04/27/2008   History of COVID-19 06/27/2022   HLD (hyperlipidemia) 07/28/2015   Check lipid panel.  She will continue Lipitor.   Hot flash, menopausal 02/07/2018   Will check with our clinical pharmacist regarding the black cohosh and primrose in this patient.   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   Iron deficiency anemia, unspecified 04/27/2008   Ischemic cardiomyopathy 10/02/2016   Joint pain    Left bundle branch block 12/03/2018   Chronic.  She will follow with cardiology.   Major depressive disorder 07/28/2015   Chronic ongoing issue.  Has worsened somewhat recently.  Notes passive SI.  Advised if she develops intent or plan to harm herself she needs to go to the emergency room.  I advised her to contact her psychiatrist to arrange for follow-up and she was willing to do so.  She does have protective factors in place including her husband, dog, and nephew.   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  She will follow with cardiology.  We will send her EKG to her psychiatrist and have CMA contact the psychiatry office to inform them that the EKG was faxed and of the results.   Rash 02/07/2018   Possibly related to folliculitis or some other undetermined cause of her rash.  She will trial over-the-counter antibiotic ointment and if not beneficial she will let us know and we can refer her to dermatology.   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 Excell Seltzer and Dr. 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  She will continue [name] 10 mg daily.   Uterine leiomyoma 04/27/2008   Vitamin D deficiency 09/15/2010    Tobacco Use: Social History   Tobacco Use  Smoking Status Former   Current packs/day: 0.00   Average packs/day: 0.5 packs/day for 25.0 years (12.5 ttl pk-yrs)   Types: Cigarettes   Start date: 04/13/1972   Quit date: 04/13/1997   Years since quitting: 26.2  Smokeless Tobacco Never    Labs: Review Flowsheet  More data exists      Latest Ref Rng & Units 10/15/2022 10/24/2022 10/25/2022 10/26/2022 03/20/2023  Labs for ITP Cardiac  and Pulmonary Rehab  Cholestrol 0 - 200 mg/dL - - - - 237   LDL (calc) 0 - 99 mg/dL - - - - 75   HDL-C >62.83 mg/dL - - - - 15.17   Trlycerides 0.0 - 149.0 mg/dL - - - - 61.6   Hemoglobin A1c 4.6 - 6.5 % - - - - 5.9   PH, Arterial 7.35 - 7.45 - 7.316  7.349  7.348  7.380  7.437  7.379  7.263  - - -  PCO2 arterial 32 - 48 mmHg - 40.6  38.3  35.2  35.7  32.0  34.0  42.2  - - -  Bicarbonate 20.0 - 28.0 mmol/L 25.4  25.8  20.7  21.1  19.3  21.4  21.6  27.8  20.0  19.1  - - -  TCO2 22 - 32 mmol/L 27  27  22  22  20  23  23  24   49  29  21  20  20  23   - - -  Acid-base deficit 0.0 - 2.0 mmol/L - 5.0  4.0  6.0  4.0  2.0  5.0  7.0  - - -  O2 Saturation % 64  64  98  79  98  98  93  100  81  100  100  70.1  78.2  -    Details       Multiple values from one day are sorted in reverse-chronological order          Exercise Target Goals: Exercise Program Goal: Individual exercise prescription set using results from initial 6 min walk test and THRR while considering  patient's activity barriers and safety.   Exercise Prescription Goal: Initial exercise prescription builds to 30-45 minutes a day of aerobic activity, 2-3 days per week.  Home exercise guidelines  will be given to patient during program as part of exercise prescription that the participant will acknowledge.   Education: Aerobic Exercise: - Group verbal and visual presentation on the components of exercise prescription. Introduces F.I.T.T principle from ACSM for exercise prescriptions.  Reviews F.I.T.T. principles of aerobic exercise including progression. Written material given at graduation.   Education: Resistance Exercise: - Group verbal and visual presentation on the components of exercise prescription. Introduces F.I.T.T principle from ACSM for exercise prescriptions  Reviews F.I.T.T. principles of resistance exercise including progression. Written material given at graduation.    Education: Exercise & Equipment Safety: - Individual verbal instruction and demonstration of equipment use and safety with use of the equipment. Flowsheet Row Cardiac Rehab from 01/28/2023 in [facility] Cardiac and Pulmonary Rehab  Date 01/28/23  Educator [facility]  Instruction Review Code 1- Verbalizes Understanding       Education: Exercise Physiology & General Exercise Guidelines: - Group verbal and written instruction with models to review the exercise physiology of the cardiovascular system and associated critical values. Provides general exercise guidelines with specific guidelines to those with heart or lung disease.    Education: Flexibility, Balance, Mind/Body Relaxation: - Group verbal and visual presentation with interactive activity on the components of exercise prescription. Introduces F.I.T.T principle from ACSM for exercise prescriptions. Reviews F.I.T.T. principles of flexibility and balance exercise training including progression. Also discusses the mind body connection.  Reviews various relaxation techniques to help reduce and manage stress (i.e. Deep breathing, progressive muscle relaxation, and visualization). Balance handout provided to take home. Written material given at  graduation.   Activity Barriers & Risk Stratification:  Activity Barriers & Cardiac  Risk Stratification - 01/28/23 1704       Activity Barriers & Cardiac Risk Stratification   Activity Barriers None    Cardiac Risk Stratification High             6 Minute Walk:  6 Minute Walk     Row Name 01/28/23 1649         6 Minute Walk   Phase Initial     Distance 820 feet     Walk Time 6 minutes     # of Rest Breaks 0     MPH 1.55     METS 1.87     RPE 11     Perceived Dyspnea  1     VO2 Peak 6.54     Symptoms No     Resting HR 85 bpm     Resting BP 124/72     Resting Oxygen Saturation  100 %     Exercise Oxygen Saturation  during 6 min walk 100 %     Max Ex. HR 91 bpm     Max Ex. BP 112/60     2 Minute Post BP 104/60              Oxygen Initial Assessment:   Oxygen Re-Evaluation:   Oxygen Discharge (Final Oxygen Re-Evaluation):   Initial Exercise Prescription:  Initial Exercise Prescription - 01/28/23 1700       Date of Initial Exercise RX and Referring Provider   Date 01/28/23    Referring Provider Excell Seltzer      Oxygen   Maintain Oxygen Saturation 88% or higher      Treadmill   MPH 1.2    Grade 0    Minutes 15    METs 1.92      Recumbant Bike   Level 1    RPM 50    Watts 15    Minutes 15      NuStep   Level 1    SPM 80    Minutes 15    METs 1.87      T5 Nustep   Level 1    SPM 80    Minutes 15    METs 1.87      Biostep-RELP   Level 1    SPM 50    Minutes 15    METs 1.87      Prescription Details   Frequency (times per week) 2    Duration Progress to 30 minutes of continuous aerobic without signs/symptoms of physical distress      Intensity   THRR 40-80% of Max Heartrate 110-135    Ratings of Perceived Exertion 11-13    Perceived Dyspnea 0-4      Progression   Progression Continue to progress workloads to maintain intensity without signs/symptoms of physical distress.      Resistance Training   Training Prescription  Yes    Weight 2    Reps 10-15             Perform Capillary Blood Glucose checks as needed.  Exercise Prescription Changes:   Exercise Prescription Changes     Row Name 01/28/23 1700 02/11/23 1400 02/25/23 1500 03/12/23 0800 03/18/23 1600     Response to Exercise   Blood Pressure (Admit) 124/72 132/62 128/68 130/60 --   Blood Pressure (Exercise) 112/60 136/60 142/62 138/60 --   Blood Pressure (Exit) 104/60 130/64 148/60 124/82 --   Heart Rate (Admit) 85 bpm 71 bpm 71 bpm 68 bpm --  Heart Rate (Exercise) 91 bpm 94 bpm 94 bpm 102 bpm --   Heart Rate (Exit) 86 bpm 82 bpm 63 bpm 79 bpm --   Oxygen Saturation (Admit) 100 % -- -- -- --   Oxygen Saturation (Exercise) 100 % -- -- -- --   Oxygen Saturation (Exit) 100 % -- -- -- --   Rating of Perceived Exertion (Exercise) 11 13 13 13  --   Perceived Dyspnea (Exercise) 1 -- -- -- --   Symptoms none none none none --   Comments 6 MWT results 2nd full day of exercise -- -- --   Duration -- Progress to 30 minutes of  aerobic without signs/symptoms of physical distress Continue with 30 min of aerobic exercise without signs/symptoms of physical distress. Continue with 30 min of aerobic exercise without signs/symptoms of physical distress. --   Intensity -- THRR unchanged THRR unchanged THRR unchanged --     Progression   Progression -- Continue to progress workloads to maintain intensity without signs/symptoms of physical distress. Continue to progress workloads to maintain intensity without signs/symptoms of physical distress. Continue to progress workloads to maintain intensity without signs/symptoms of physical distress. --   Average METs -- 1.72 1.95 2.11 --     Resistance Training   Training Prescription -- Yes Yes Yes --   Weight -- 2 lb 2 lb 2 lb --   Reps -- 10-15 10-15 10-15 --     Interval Training   Interval Training -- No No No --     Treadmill   MPH -- -- 0.7 0.7 --   Grade -- -- 0 0 --   Minutes -- -- 15 15 --   METs  -- -- 1.54 1.5 --     NuStep   Level -- 1 2 2  --   Minutes -- 15 15 15  --   METs -- 1.9 2.5 2.6 --     REL-XR   Level -- -- 1 -- --   Minutes -- -- 15 -- --   METs -- -- 1.6 -- --     T5 Nustep   Level -- 1 -- 1 --   Minutes -- 15 -- 15 --   METs -- 1.8 -- 1.7 --     Track   Laps -- 14 22 30  --   Minutes -- 15 15 15  --   METs -- 1.76 2.2 2.63 --     Home Exercise Plan   Plans to continue exercise at -- -- -- -- Home (comment)  walking, possibly looking into [employer]   Frequency -- -- -- -- Add 2 additional days to program exercise sessions.   Initial Home Exercises Provided -- -- -- -- 03/18/23     Oxygen   Maintain Oxygen Saturation -- 88% or higher 88% or higher 88% or higher 88% or higher    Row Name 03/26/23 0800 04/09/23 0800 04/23/23 1500 05/10/23 0700 05/22/23 1400     Response to Exercise   Blood Pressure (Admit) 128/54 102/60 114/66 130/70 124/58   Blood Pressure (Exercise) -- -- 142/72 140/70 144/66   Blood Pressure (Exit) 128/60 122/72 100/60 124/66 134/58   Heart Rate (Admit) 82 bpm 88 bpm 85 bpm 87 bpm 101 bpm   Heart Rate (Exercise) 97 bpm 104 bpm 100 bpm 94 bpm 100 bpm   Heart Rate (Exit) 78 bpm 86 bpm 80 bpm 80 bpm 94 bpm   Rating of Perceived Exertion (Exercise) 13 13 13 13  13  Symptoms none none none none none   Duration Continue with 30 min of aerobic exercise without signs/symptoms of physical distress. Continue with 30 min of aerobic exercise without signs/symptoms of physical distress. Continue with 30 min of aerobic exercise without signs/symptoms of physical distress. Continue with 30 min of aerobic exercise without signs/symptoms of physical distress. Continue with 30 min of aerobic exercise without signs/symptoms of physical distress.   Intensity THRR unchanged THRR unchanged THRR unchanged THRR unchanged THRR unchanged     Progression   Progression Continue to progress workloads to maintain intensity without signs/symptoms of physical distress.  Continue to progress workloads to maintain intensity without signs/symptoms of physical distress. Continue to progress workloads to maintain intensity without signs/symptoms of physical distress. Continue to progress workloads to maintain intensity without signs/symptoms of physical distress. Continue to progress workloads to maintain intensity without signs/symptoms of physical distress.   Average METs 1.97 2.66 2.74 2.24 2.16     Resistance Training   Training Prescription Yes Yes Yes Yes Yes   Weight 2 lb 2 lb 2 lb 2 lb 2 lb   Reps 10-15 10-15 10-15 10-15 10-15     Interval Training   Interval Training No No No No No     Treadmill   MPH 0.7 -- -- -- --   Grade 0 -- -- -- --   Minutes 15 -- -- -- --   METs 1.54 -- -- -- --     NuStep   Level -- 2 -- 1 1   Minutes -- 15 -- 15 15   METs -- 2.7 -- 2.24 2.4     Arm Ergometer   Level -- -- 1 -- --   Minutes -- -- 15 -- --   METs -- -- 2.5 -- --     REL-XR   Level 1 -- 1 1 --   Minutes 15 -- 15 15 --   METs 2.4 -- 3 -- --     Track   Laps -- 30 35 30 17   Minutes -- 15 15 15 15    METs -- 2.63 2.9 2.13 1.92     Home Exercise Plan   Plans to continue exercise at Home (comment)  walking, possibly looking into [facility] (comment)  walking, possibly looking into [facility] (comment)  walking, possibly looking into [facility] (comment)  walking, possibly looking into [facility] (comment)  walking, possibly looking into [employer]   Frequency Add 2 additional days to program exercise sessions. Add 2 additional days to program exercise sessions. Add 2 additional days to program exercise sessions. Add 2 additional days to program exercise sessions. Add 2 additional days to program exercise sessions.   Initial Home Exercises Provided 03/18/23 03/18/23 03/18/23 03/18/23 03/18/23     Oxygen   Maintain Oxygen Saturation 88% or higher 88% or higher 88% or higher 88% or higher 88% or higher    Row Name 06/06/23 1500 06/19/23 1300  07/02/23 1300         Response to Exercise   Blood Pressure (Admit) 114/58 142/70 146/60     Blood Pressure (Exercise) 140/70 -- --     Blood Pressure (Exit) 138/70 124/70 152/64     Heart Rate (Admit) 84 bpm 92 bpm 99 bpm     Heart Rate (Exercise) 107 bpm 102 bpm 110 bpm     Heart Rate (Exit) 85 bpm 94 bpm 95 bpm     Rating of Perceived Exertion (Exercise) 13 13 13  Symptoms none none none     Duration Continue with 30 min of aerobic exercise without signs/symptoms of physical distress. Continue with 30 min of aerobic exercise without signs/symptoms of physical distress. Continue with 30 min of aerobic exercise without signs/symptoms of physical distress.     Intensity THRR unchanged THRR unchanged THRR unchanged       Progression   Progression Continue to progress workloads to maintain intensity without signs/symptoms of physical distress. Continue to progress workloads to maintain intensity without signs/symptoms of physical distress. Continue to progress workloads to maintain intensity without signs/symptoms of physical distress.     Average METs 2.07 2.25 1.97       Resistance Training   Training Prescription Yes Yes Yes     Weight 2 lb 2 lb 2 lb     Reps 10-15 10-15 10-15       Interval Training   Interval Training No No No       Treadmill   MPH 1 0.6 0.8     Grade 0 0 0     Minutes 15 15 10      METs 1.77 1.46 1.61       Recumbant Bike   Level -- -- 1     Watts -- -- 15     Minutes -- -- 15       NuStep   Level 1 -- --     Minutes 15 -- --       REL-XR   Level 1 1 --     Minutes 15 15 --     METs 1.3 2.4 --       Track   Laps 35 35 24     Minutes 15 15 15      METs 2.9 2.9 2.31       Home Exercise Plan   Plans to continue exercise at Home (comment)  walking, possibly looking into [facility] (comment)  walking, possibly looking into [facility] (comment)  walking, possibly looking into [employer]     Frequency Add 2 additional days to program exercise  sessions. Add 2 additional days to program exercise sessions. Add 2 additional days to program exercise sessions.     Initial Home Exercises Provided 03/18/23 03/18/23 03/18/23       Oxygen   Maintain Oxygen Saturation 88% or higher 88% or higher 88% or higher              Exercise Comments:   Exercise Comments     Row Name 02/05/23 1007           Exercise Comments First full day of exercise!  Patient was oriented to gym and equipment including functions, settings, policies, and procedures.  Patient's individual exercise prescription and treatment plan were reviewed.  All starting workloads were established based on the results of the 6 minute walk test done at initial orientation visit.  The plan for exercise progression was also introduced and progression will be customized based on patient's performance and goals.                Exercise Goals and Review:   Exercise Goals     Row Name 01/28/23 1715             Exercise Goals   Increase Physical Activity Yes       Intervention Provide advice, education, support and counseling about physical activity/exercise needs.;Develop an individualized exercise prescription for aerobic and resistive training based on initial evaluation findings, risk  stratification, comorbidities and participant's personal goals.       Expected Outcomes Short Term: Attend rehab on a regular basis to increase amount of physical activity.;Long Term: Add in home exercise to make exercise part of routine and to increase amount of physical activity.;Long Term: Exercising regularly at least 3-5 days a week.       Increase Strength and Stamina Yes       Intervention Provide advice, education, support and counseling about physical activity/exercise needs.;Develop an individualized exercise prescription for aerobic and resistive training based on initial evaluation findings, risk stratification, comorbidities and participant's personal goals.       Expected  Outcomes Short Term: Increase workloads from initial exercise prescription for resistance, speed, and METs.;Short Term: Perform resistance training exercises routinely during rehab and add in resistance training at home;Long Term: Improve cardiorespiratory fitness, muscular endurance and strength as measured by increased METs and functional capacity ( )       Able to understand and use rate of perceived exertion (RPE) scale Yes       Intervention Provide education and explanation on how to use RPE scale       Expected Outcomes Short Term: Able to use RPE daily in rehab to express subjective intensity level;Long Term:  Able to use RPE to guide intensity level when exercising independently       Able to understand and use Dyspnea scale Yes       Intervention Provide education and explanation on how to use Dyspnea scale       Expected Outcomes Short Term: Able to use Dyspnea scale daily in rehab to express subjective sense of shortness of breath during exertion;Long Term: Able to use Dyspnea scale to guide intensity level when exercising independently       Knowledge and understanding of Target Heart Rate Range (THRR) Yes       Intervention Provide education and explanation of THRR including how the numbers were predicted and where they are located for reference       Expected Outcomes Short Term: Able to state/look up THRR;Long Term: Able to use THRR to govern intensity when exercising independently;Short Term: Able to use daily as guideline for intensity in rehab       Able to check pulse independently Yes       Intervention Provide education and demonstration on how to check pulse in carotid and radial arteries.;Review the importance of being able to check your own pulse for safety during independent exercise       Expected Outcomes Short Term: Able to explain why pulse checking is important during independent exercise;Long Term: Able to check pulse independently and accurately       Understanding of  Exercise Prescription Yes       Intervention Provide education, explanation, and written materials on patient's individual exercise prescription       Expected Outcomes Short Term: Able to explain program exercise prescription;Long Term: Able to explain home exercise prescription to exercise independently                Exercise Goals Re-Evaluation :  Exercise Goals Re-Evaluation     Row Name 02/05/23 1007 02/11/23 1436 02/25/23 1508 03/12/23 0831 03/18/23 1624     Exercise Goal Re-Evaluation   Exercise Goals Review Increase Physical Activity;Able to understand and use rate of perceived exertion (RPE) scale;Knowledge and understanding of Target Heart Rate Range (THRR);Understanding of Exercise Prescription;Increase Strength and Stamina;Able to check pulse independently Increase Physical Activity;Increase Strength and Stamina;Understanding  of Exercise Prescription Increase Physical Activity;Increase Strength and Stamina;Understanding of Exercise Prescription Increase Physical Activity;Increase Strength and Stamina;Understanding of Exercise Prescription Increase Physical Activity;Increase Strength and Stamina;Understanding of Exercise Prescription   Comments Reviewed RPE scale, THR and program prescription with pt today.  Pt voiced understanding and was given a copy of goals to take home. Patient is doing well for the first couple of times she has been at rehab. She was able to get 14 laps on the track and exercise the rest at her initial exercise prescription. She had appropriate RPEs throughout the sessions. We will continue to monitor as she progresses in the program. Joeline is doing well in rehab. She walked up to 22 laps on the track, and also started using the treadmill and did well at a speed of 0.7 mph with no incline. She also was able to improve to level 2 on the T4 nustep and began using the XR at level 1. We will continue to monitor her progress in the program. Mang continues to do well  in rehab. She walked up to 30 laps on the track this time around!  She has been consistent at level 1 on the T5 Nustep and could benefit from increasing to level 2. She has not been quite hitting her THR at this time. She has been walking at a 0.7 mph on the treadmill and seems to benefit more from the track as her speed is increased. Will continue to monitor. Reviewed home exercise with pt today.  Pt plans to walk for exercise. Patient has a treadmill at home. She may also walk outdoors or indoors if the weather is not appropriate. She was encouraged to practice walking on the TM here at rehab to ensure she has a good a balanced feel for it to make sure she is safe with it.  We also discussed the [facility] and she was willing to explore it as an option. She also was encouraged to use household items (like canned food) to help with resistance. Reviewed THR, pulse, RPE, sign and symptoms, pulse oximetery and when to call 911 or MD.  Also discussed weather considerations and indoor options.  Pt voiced understanding.   Expected Outcomes Short: Use RPE daily to regulate intensity.  Long: Follow program prescription in THR. Short: Continue to work at initial exercise prescription Long: Increase overall MET level and stamina Short: Continue to push for more laps on track, and progressively increase treadmill workload. Long: Continue to improve strength and stamina. Short: Slowly increase speed on treadmill, increase laps on track Long: Continue to increase overall MET level and stamina Short: Add on 1 day of exercise at home, check out the [facility] at her own convenience Long: Continue to exercise independently at home    Row Name 03/26/23 0818 04/09/23 0833 04/23/23 1503 05/10/23 0749 05/22/23 1443     Exercise Goal Re-Evaluation   Exercise Goals Review Increase Physical Activity;Increase Strength and Stamina;Understanding of Exercise Prescription Increase Physical Activity;Increase Strength and  Stamina;Understanding of Exercise Prescription Increase Physical Activity;Increase Strength and Stamina;Understanding of Exercise Prescription Increase Physical Activity;Increase Strength and Stamina;Understanding of Exercise Prescription Increase Physical Activity;Increase Strength and Stamina;Understanding of Exercise Prescription   Comments Brei has only attended rehab once since the last review. During her one session she was able to work at level 1 on the XR and she walked the treadmill at a speed of 0.7 mph with no incline. She also has continued to use 2 lb hand weights for resistance training. We  will continue to monitor her progress in the program. Franchelle is doing well in rehab.  Her attendance has been spotty at best averaging around once a week.  We have encouraged her to attend consistently to see more progress.  She is up to 30 laps and level 2 on the NuStep.  We will conitnue to monitor her progress. Aubria is doing well in rehab. She recently increased her overall average MET level to 2.74 METs. She also walked up to 35 laps on the track after previously only walking 30 laps. She began using the arm crank at level 1, and continued ot work at level 1 on the XR as well. We will continue to monitor her progress. Tasheanna is maintaining her previous workloads with RPE at 13. Will encourage increasing levels if tolerated to help with  increasingstrength and stamina. Will continue to monitor progression. Sharity has only attended one session since the last review. She was able to work at level 1 on the T4 nustep and walk 17 laps on the track. She also continues to use 2 lb hand weights for resistance training. We will continue to monitor her progress in the program.   Expected Outcomes Short: Return to consistent attendance in rehab. Long: Continue to improve strength and stamina. Short: Attend rehab regularly Long: Continue to improve stamina Short: Try level 2 on the XR. Long: Continue to improve  strength and stamina. Short: Try level 2 on the XR or Nustep. Be sure walking at home outside weather permitting or inside. Long: Continue to improve strength and stamina. Short: Return to regular attendance, try level 2 on the T4 nustep. Long: Continue exercise to improve strength and stamina.    Row Name 06/06/23 1531 06/10/23 1610 06/19/23 1326 07/02/23 1314       Exercise Goal Re-Evaluation   Exercise Goals Review Increase Physical Activity;Increase Strength and Stamina;Understanding of Exercise Prescription Increase Physical Activity;Increase Strength and Stamina;Understanding of Exercise Prescription Increase Physical Activity;Increase Strength and Stamina;Understanding of Exercise Prescription Increase Physical Activity;Increase Strength and Stamina;Understanding of Exercise Prescription    Comments Brianda is doing well in rehab. She recently increased her speed up to 1 mph with no incline. She also walked on the track and increased her laps back up to 35 laps. She also has continued to work at level 1 on the XR and T4 nustep. We will continue to monitor her progress in the program. Tremayne reports that she has not been exercising at home recently, but does have a treadmill at home that she can use. She plans to start by adding in one day of walking at home and working her way up to 3 days at home. She is also getting close to time for her post and will look to improve on it. We will continue to monitor her progress in the program. Moneca returned to rehab for two sessions. She continues to walk up to 35 laps on the track and work at level 1 on the XR. She is also getting close to time for her post and will look to improve on it. We will continue to monitor her progress in the program. Malachi Bonds attended two sessions since the last review. She continues to walk on the treadmill with her speed at 0.8 mph and no incline. She also is working a level 1 on the recumbent bike. She is also due for her  post and will look to improve on it. We will continue to monitor her progress in  the program.    Expected Outcomes Short: Try level 2 on the T4 nustep. Long: Continue exercise to improve strength and stamina. Short: Improve on post . Long: Continue exercise to improve strength and stamina. Short: Improve on post . Long: Continue exercise to improve strength and stamina. Short: Improve on post . Long: Continue exercise to improve strength and stamina.             Discharge Exercise Prescription (Final Exercise Prescription Changes):  Exercise Prescription Changes - 07/02/23 1300       Response to Exercise   Blood Pressure (Admit) 146/60    Blood Pressure (Exit) 152/64    Heart Rate (Admit) 99 bpm    Heart Rate (Exercise) 110 bpm    Heart Rate (Exit) 95 bpm    Rating of Perceived Exertion (Exercise) 13    Symptoms none    Duration Continue with 30 min of aerobic exercise without signs/symptoms of physical distress.    Intensity THRR unchanged      Progression   Progression Continue to progress workloads to maintain intensity without signs/symptoms of physical distress.    Average METs 1.97      Resistance Training   Training Prescription Yes    Weight 2 lb    Reps 10-15      Interval Training   Interval Training No      Treadmill   MPH 0.8    Grade 0    Minutes 10    METs 1.61      Recumbant Bike   Level 1    Watts 15    Minutes 15      Track   Laps 24    Minutes 15    METs 2.31      Home Exercise Plan   Plans to continue exercise at Home (comment)   walking, possibly looking into [employer]   Frequency Add 2 additional days to program exercise sessions.    Initial Home Exercises Provided 03/18/23      Oxygen   Maintain Oxygen Saturation 88% or higher             Nutrition:  Target Goals: Understanding of nutrition guidelines, daily intake of sodium 1500mg , cholesterol 200mg , calories 30% from fat and 7% or less from saturated fats,  daily to have 5 or more servings of fruits and vegetables.  Education: All About Nutrition: -Group instruction provided by verbal, written material, interactive activities, discussions, models, and posters to present general guidelines for heart healthy nutrition including fat, fiber, MyPlate, the role of sodium in heart healthy nutrition, utilization of the nutrition label, and utilization of this knowledge for meal planning. Follow up email sent as well. Written material given at graduation.   Biometrics:  Pre Biometrics - 01/28/23 1716       Pre Biometrics   Height 5\' 3"  (1.6 m)    Weight 132 lb 1.6 oz (59.9 kg)    Waist Circumference 33 inches    Hip Circumference 36.5 inches    Waist to Hip Ratio 0.9 %    BMI (Calculated) 23.41    Single Leg Stand 7.09 seconds              Nutrition Therapy Plan and Nutrition Goals:  Nutrition Therapy & Goals - 01/28/23 1554       Nutrition Therapy   Diet Heart healthy, low Na, T2DM MNT    Drug/Food Interactions Statins/Certain Fruits;Coumadin/Vit K    Protein (specify units) 75g  Fiber 25 grams    Whole Grain Foods 3 servings    Saturated Fats 12 max. grams    Fruits and Vegetables 8 servings/day    Sodium 2 grams      Personal Nutrition Goals   Nutrition Goal ST: compare 3-4 days of eating with list of foods high in vitamin K to see how much you are eating, read/compare food labels, review paperwork LT: manage INR, maintain A1C <7, limit sodium <2g/day, follow MyPlate guidelines    Comments 72 y.o. F admitted to cardiac rehab for chronic systolic CHF. PMHx includes CAD, HLD, anemia, stroke, severe mitral insufficiency, severe mitral regurgitation, T2DM, osteopenia, anxiety/depression. Medications reviewed atorvastatin, jardiance, furosemide, potassium-chloride, trazodone, warfarin. B: [place] sausage and eggs D: chicken or fish or beef with a vegetables. She reports using butter, she will use seasoning salt with her meat. She may  also have cold cuts. S:she reports having a sweet tooth - she will have a cookie or 1/2 cup of ice cream and she may have reeses PB cup. Drinks: water and diet coke (not even a whole bottle per day and sweet tea (1/4 cup usually). Discussed heart healthy eating and diabetes friendly eating. Julietta would like to work on limiting sodium - reviewed label reading; she feels confident in label reading. Discussed warfarin and vitamin K consistency.      Intervention Plan   Intervention Prescribe, educate and counsel regarding individualized specific dietary modifications aiming towards targeted core components such as weight, hypertension, lipid management, diabetes, heart failure and other comorbidities.;Nutrition handout(s) given to patient.    Expected Outcomes Short Term Goal: Understand basic principles of dietary content, such as calories, fat, sodium, cholesterol and nutrients.;Short Term Goal: A plan has been developed with personal nutrition goals set during dietitian appointment.;Long Term Goal: Adherence to prescribed nutrition plan.             Nutrition Assessments:  MEDIFICTS Score Key: ?70 Need to make dietary changes  40-70 Heart Healthy Diet ? 40 Therapeutic Level Cholesterol Diet  Flowsheet Row Cardiac Rehab from 02/05/2023 in [facility] Cardiac and Pulmonary Rehab  Picture Your Plate Total Score on Admission 51      Picture Your Plate Scores: <16 Unhealthy dietary pattern with much room for improvement. 41-50 Dietary pattern unlikely to meet recommendations for good health and room for improvement. 51-60 More healthful dietary pattern, with some room for improvement.  >60 Healthy dietary pattern, although there may be some specific behaviors that could be improved.    Nutrition Goals Re-Evaluation:  Nutrition Goals Re-Evaluation     Row Name 03/18/23 1617 03/27/23 1552 04/08/23 1613 06/10/23 1632       Goals   Current Weight -- -- 136 lb (61.7 kg) --    Nutrition Goal ST:  compare 3-4 days of eating with list of foods high in vitamin K to see how much you are eating, read/compare food labels, review paperwork LT: manage INR, maintain A1C <7, limit sodium <2g/day, follow MyPlate guidelines -- Eat more --    Comment Adrena states she has not really read or paid attention to her Vitamin K intake. Encouraged to start keeping track of the foods she eats (per RD goals take 3-4 days worth of eating) and advised her to discuss with her MD as our RD is not in her role anymore to review how much she should be having. Staff to review education with patient as well  so patient has a better understanding on importance. Provide a  handout. She is reading labels for her sodium intake and staying mindful on what she is eating and how much she is eating Per encounter dated from 4/9- patient is not taking warfarin anymore and therefore does not need to monitor Vitamin K levels.  RN reviewed education and information with patient and confirmed medication list. New goals can be established by new RD for further nutrition goals if needed. Nothings is tasting good for her anymore and she has had a poor appetite since she is taking care of her husband. She is not intrested in meeting with the dietitian. Claudelle reports that she continues to have a poor appetite. She did state that she will work on eliminating sodium from her diet and will check food labels. She has deferred to meet with the RD at this time.    Expected Outcome Short: Understand Vitamin K education involved with her diet, staff to review more information. Patient to start keeping log of food for next 3-4 days per RD goal Long: Continue to follow guidelines by RD and eat a heart healthy diet Short: Establish new goals by new RD if needed Long: Continue to eat heart healthy diet Short: try to eat more when able. Long: maintain a diet that adheres to her. Short: Continue to try to eat more when able. Long: Practice Heart-healthy eating  patterns.             Nutrition Goals Discharge (Final Nutrition Goals Re-Evaluation):  Nutrition Goals Re-Evaluation - 06/10/23 1632       Goals   Comment Torre reports that she continues to have a poor appetite. She did state that she will work on eliminating sodium from her diet and will check food labels. She has deferred to meet with the RD at this time.    Expected Outcome Short: Continue to try to eat more when able. Long: Practice Heart-healthy eating patterns.             Psychosocial: Target Goals: Acknowledge presence or absence of significant depression and/or stress, maximize coping skills, provide positive support system. Participant is able to verbalize types and ability to use techniques and skills needed for reducing stress and depression.   Education: Stress, Anxiety, and Depression - Group verbal and visual presentation to define topics covered.  Reviews how body is impacted by stress, anxiety, and depression.  Also discusses healthy ways to reduce stress and to treat/manage anxiety and depression.  Written material given at graduation.   Education: Sleep Hygiene -Provides group verbal and written instruction about how sleep can affect your health.  Define sleep hygiene, discuss sleep cycles and impact of sleep habits. Review good sleep hygiene tips.    Initial Review & Psychosocial Screening:   Quality of Life Scores:   Quality of Life - 02/05/23 1016       Quality of Life   Select Quality of Life      Quality of Life Scores   Health/Function Pre 16.5 %    Socioeconomic Pre 29.14 %    Psych/Spiritual Pre 16 %    Family Pre 26 %    GLOBAL Pre 20.57 %            Scores of 19 and below usually indicate a poorer quality of life in these areas.  A difference of  2-3 points is a clinically meaningful difference.  A difference of 2-3 points in the total score of the Quality of Life Index has been associated with significant improvement in  overall  quality of life, self-image, physical symptoms, and general health in studies assessing change in quality of life.  PHQ-9: Review Flowsheet  More data exists      05/30/2023 05/13/2023 04/22/2023 04/08/2023 03/20/2023  Depression screen PHQ 2/9  Decreased Interest 2 2 2 3 1   Down, Depressed, Hopeless 2 2 2 3 1   PHQ - 2 Score 4 4 4 6 2   Altered sleeping - 3 1 1 1   Tired, decreased energy 3 2 2 1 2   Change in appetite 1 2 1 1  0  Feeling bad or failure about yourself  2 2 2  0 1  Trouble concentrating 1 1 0 0 0  Moving slowly or fidgety/restless 0 1 0 0 1  Suicidal thoughts 0 3 3 1 1   PHQ-9 Score - 18 13 10 8   Difficult doing work/chores - Somewhat difficult Somewhat difficult Somewhat difficult Somewhat difficult    Details           Interpretation of Total Score  Total Score Depression Severity:  1-4 = Minimal depression, 5-9 = Mild depression, 10-14 = Moderate depression, 15-19 = Moderately severe depression, 20-27 = Severe depression   Psychosocial Evaluation and Intervention:   Psychosocial Re-Evaluation:  Psychosocial Re-Evaluation     Row Name 03/18/23 1621 04/08/23 1610 06/10/23 1629         Psychosocial Re-Evaluation   Current issues with Current Depression;Current Psychotropic Meds Current Depression;Current Psychotropic Meds Current Depression;Current Psychotropic Meds     Comments Mikey College states she is doing well mentally. She takes Trazadone before her sleep which helps her. She just came back from a trip from [place]- her sister just got doctor in education. She is taking all of her medications appropriately and states she has no extra stress, anxiety, or depression symptoms at this time. She is enjoying coming to rehab. Encouraged her to reach out should anything change. Reviewed patient health questionnaire (PHQ-9) with patient for follow up. Previously, patients score indicated signs/symptoms of depression.  Reviewed to see if patient is improving symptom wise while in  program.  Score declined and patient states that it is because she takes care of her husband who is in a facility and her dog requires alot of attention. She has no plans to hurt herself at this time. She states its stems more from the stress of life and that it would not be there if she were to pass. Crescent reports that she continues to deal with some stress and anxiety related to being a caregiver for her husband. However, she does report that she is managing her stress better and relieves stress by reading and going to lunch with friends. Exercise has also been a good stress reliever for her.     Expected Outcomes Short: Continue coming to rehab for mood boost Long: Continue to maintain positive attitude and utilize exercise for stress management Short: Continue to work toward an improvement in PHQ9 scores by attending [facility] regularly. Long: Continue to improve stress and depression coping skills by talking with staff and attending [facility] regularly and work toward a positive mental state. Short: Continue to exercise for mental boost. Long: Continue to maintain positive outlook.     Interventions Encouraged to attend Cardiac Rehabilitation for the exercise Encouraged to attend Cardiac Rehabilitation for the exercise Encouraged to attend Cardiac Rehabilitation for the exercise     Continue Psychosocial Services  Follow up required by staff Follow up required by staff Follow up required by staff  Psychosocial Discharge (Final Psychosocial Re-Evaluation):  Psychosocial Re-Evaluation - 06/10/23 1629       Psychosocial Re-Evaluation   Current issues with Current Depression;Current Psychotropic Meds    Comments Samaris reports that she continues to deal with some stress and anxiety related to being a caregiver for her husband. However, she does report that she is managing her stress better and relieves stress by reading and going to lunch with friends. Exercise  has also been a good stress reliever for her.    Expected Outcomes Short: Continue to exercise for mental boost. Long: Continue to maintain positive outlook.    Interventions Encouraged to attend Cardiac Rehabilitation for the exercise    Continue Psychosocial Services  Follow up required by staff             Vocational Rehabilitation: Provide vocational rehab assistance to qualifying candidates.   Vocational Rehab Evaluation & Intervention:   Education: Education Goals: Education classes will be provided on a variety of topics geared toward better understanding of heart health and risk factor modification. Participant will state understanding/return demonstration of topics presented as noted by education test scores.  Learning Barriers/Preferences:   General Cardiac Education Topics:  AED/CPR: - Group verbal and written instruction with the use of models to demonstrate the basic use of the AED with the basic ABC's of resuscitation.   Anatomy and Cardiac Procedures: - Group verbal and visual presentation and models provide information about basic cardiac anatomy and function. Reviews the testing methods done to diagnose heart disease and the outcomes of the test results. Describes the treatment choices: Medical Management, Angioplasty, or Coronary Bypass Surgery for treating various heart conditions including Myocardial Infarction, Angina, Valve Disease, and Cardiac Arrhythmias.  Written material given at graduation.   Medication Safety: - Group verbal and visual instruction to review commonly prescribed medications for heart and lung disease. Reviews the medication, class of the drug, and side effects. Includes the steps to properly store meds and maintain the prescription regimen.  Written material given at graduation.   Intimacy: - Group verbal instruction through game format to discuss how heart and lung disease can affect sexual intimacy. Written material given at  graduation..   Know Your Numbers and Heart Failure: - Group verbal and visual instruction to discuss disease risk factors for cardiac and pulmonary disease and treatment options.  Reviews associated critical values for Overweight/Obesity, Hypertension, Cholesterol, and Diabetes.  Discusses basics of heart failure: signs/symptoms and treatments.  Introduces Heart Failure Zone chart for action plan for heart failure.  Written material given at graduation.   Infection Prevention: - Provides verbal and written material to individual with discussion of infection control including proper hand washing and proper equipment cleaning during exercise session. Flowsheet Row Cardiac Rehab from 01/28/2023 in [facility] Cardiac and Pulmonary Rehab  Date 01/28/23  Educator [facility]  Instruction Review Code 1- Verbalizes Understanding       Falls Prevention: - Provides verbal and written material to individual with discussion of falls prevention and safety. Flowsheet Row Cardiac Rehab from 01/28/2023 in [facility] Cardiac and Pulmonary Rehab  Date 01/28/23  Educator [facility]  Instruction Review Code 1- Verbalizes Understanding       Other: -Provides group and verbal instruction on various topics (see comments)   Knowledge Questionnaire Score:  Knowledge Questionnaire Score - 02/05/23 1016       Knowledge Questionnaire Score   Pre Score 22/26             Core Components/Risk Factors/Patient Goals  at Admission:  Personal Goals and Risk Factors at Admission - 01/28/23 1716       Core Components/Risk Factors/Patient Goals on Admission    Weight Management Yes;Weight Maintenance    Intervention Weight Management: Develop a combined nutrition and exercise program designed to reach desired caloric intake, while maintaining appropriate intake of nutrient and fiber, sodium and fats, and appropriate energy expenditure required for the weight goal.;Weight Management: Provide education and appropriate resources to help  participant work on and attain dietary goals.;Weight Management/Obesity: Establish reasonable short term and long term weight goals.    Admit Weight 132 lb 1.6 oz (59.9 kg)    Goal Weight: Short Term 132 lb (59.9 kg)    Goal Weight: Long Term 132 lb (59.9 kg)    Expected Outcomes Short Term: Continue to assess and modify interventions until short term weight is achieved;Long Term: Adherence to nutrition and physical activity/exercise program aimed toward attainment of established weight goal;Weight Maintenance: Understanding of the daily nutrition guidelines, which includes 25-35% calories from fat, 7% or less cal from saturated fats, less than 200mg  cholesterol, less than 1.5gm of sodium, & 5 or more servings of fruits and vegetables daily;Understanding recommendations for meals to include 15-35% energy as protein, 25-35% energy from fat, 35-60% energy from carbohydrates, less than 200mg  of dietary cholesterol, 20-35 gm of total fiber daily;Understanding of distribution of calorie intake throughout the day with the consumption of 4-5 meals/snacks    Diabetes Yes    Intervention Provide education about signs/symptoms and action to take for hypo/hyperglycemia.;Provide education about proper nutrition, including hydration, and aerobic/resistive exercise prescription along with prescribed medications to achieve blood glucose in normal ranges: Fasting glucose 65-99 mg/dL    Expected Outcomes Short Term: Participant verbalizes understanding of the signs/symptoms and immediate care of hyper/hypoglycemia, proper foot care and importance of medication, aerobic/resistive exercise and nutrition plan for blood glucose control.;Long Term: Attainment of HbA1C < 7%.    Heart Failure Yes    Intervention Provide a combined exercise and nutrition program that is supplemented with education, support and counseling about heart failure. Directed toward relieving symptoms such as shortness of breath, decreased exercise  tolerance, and extremity edema.    Expected Outcomes Improve functional capacity of life;Short term: Attendance in program 2-3 days a week with increased exercise capacity. Reported lower sodium intake. Reported increased fruit and vegetable intake. Reports medication compliance.;Long term: Adoption of self-care skills and reduction of barriers for early signs and symptoms recognition and intervention leading to self-care maintenance.;Short term: Daily weights obtained and reported for increase. Utilizing diuretic protocols set by physician.    Hypertension Yes    Intervention Provide education on lifestyle modifcations including regular physical activity/exercise, weight management, moderate sodium restriction and increased consumption of fresh fruit, vegetables, and low fat dairy, alcohol moderation, and smoking cessation.;Monitor prescription use compliance.    Expected Outcomes Short Term: Continued assessment and intervention until BP is < 140/49mm HG in hypertensive participants. < 130/54mm HG in hypertensive participants with diabetes, heart failure or chronic kidney disease.;Long Term: Maintenance of blood pressure at goal levels.    Lipids Yes    Intervention Provide education and support for participant on nutrition & aerobic/resistive exercise along with prescribed medications to achieve LDL 70mg , HDL >40mg .    Expected Outcomes Short Term: Participant states understanding of desired cholesterol values and is compliant with medications prescribed. Participant is following exercise prescription and nutrition guidelines.;Long Term: Cholesterol controlled with medications as prescribed, with individualized exercise RX and with personalized  nutrition plan. Value goals: LDL < 70mg , HDL > 40 mg.             Education:Diabetes - Individual verbal and written instruction to review signs/symptoms of diabetes, desired ranges of glucose level fasting, after meals and with exercise. Acknowledge that  pre and post exercise glucose checks will be done for 3 sessions at entry of program. Flowsheet Row Cardiac Rehab from 01/28/2023 in [facility]  Date 01/28/23  Educator [facility]  Instruction Review Code 1- Verbalizes Understanding       Core Components/Risk Factors/Patient Goals Review:   Goals and Risk Factor Review     Row Name 03/18/23 1619 04/08/23 1619 06/10/23 1633         Core Components/Risk Factors/Patient Goals Review   Personal Goals Review Weight Management/Obesity;Heart Failure;Diabetes;Hypertension Hypertension Hypertension     Review Mazzy states she is maintaining her weight between 130-135 lb. She knows to look for any potential fluid gain with a sudden weight gain. Denies any heart failure symptoms at this time. Does not have a BP cuff but encouraged to get one so she can monitor her BP at home. She is diet controlled for her diabetes and sees PCP this Wednesday, 5/15. and may get A1C checked then. Glorias blood pressure has been doing well. She does not check her blood pressure at home and informed her to get one if she was able to. Explained why it is important to take blood pressure at home while taking medicaions. Selina has not been checking her BP at home and does not own a BP cuff. She states that she will look into seeing if insurance will cover her getting a BP cuff at home. Her pressures have stayed within normal ranges while at cardiac rehab.     Expected Outcomes Short: See PCP on Wednesday. buy BP cuff for at home monitoring Long: Continue to manage lifestyle risk factors Short: obtain a blood pressure cuff. Long: maintain blood pressure readings at home. Short: Obtain a blood pressure cuff. Long: Maintain blood pressure readings at home.              Core Components/Risk Factors/Patient Goals at Discharge (Final Review):   Goals and Risk Factor Review - 06/10/23 1633       Core Components/Risk Factors/Patient Goals Review   Personal  Goals Review Hypertension    Review Dorthea has not been checking her BP at home and does not own a BP cuff. She states that she will look into seeing if insurance will cover her getting a BP cuff at home. Her pressures have stayed within normal ranges while at cardiac rehab.    Expected Outcomes Short: Obtain a blood pressure cuff. Long: Maintain blood pressure readings at home.             ITP Comments:  ITP Comments     Row Name 01/28/23 1647 02/05/23 1006 02/20/23 1428 03/20/23 1204 03/27/23 1904   ITP Comments Completed and gym orientation. Initial ITP created and sent for review to Dr. Daniel Nones, Medical Director. First full day of exercise!  Patient was oriented to gym and equipment including functions, settings, policies, and procedures.  Patient's individual exercise prescription and treatment plan were reviewed.  All starting workloads were established based on the results of the 6 minute walk test done at initial orientation visit.  The plan for exercise progression was also introduced and progression will be customized based on patient's performance and goals. 30 day  review completed. ITP sent to Dr. Bethann Punches, Medical Director of Cardiac Rehab. Continue with ITP unless changes are made by physician. 30 Day review completed. Medical Director ITP review done, changes made as directed, and signed approval by Medical Director. Per encounter dated from 4/9- patient is not taking warfarin anymore.  RN reviewed education with patient and confirmed medication list. New goals can be established by new RD for further nutrition goals if needed.    Row Name 04/17/23 1140 05/14/23 1513 06/11/23 1139 06/12/23 1210 07/10/23 1154   ITP Comments 30 Day review completed. Medical Director ITP review done, changes made as directed, and signed approval by Medical Director. 30 Day review completed. Medical Director ITP review done, changes made as directed, and signed approval by Medical Director.  Sporadic attendance, able to review goals. 30 Day review completed. Medical Director ITP review done, changes made as directed, and signed approval by Medical Director. 30 Day review completed. Medical Director ITP review done, changes made as directed, and signed approval by Medical Director.            Comments:

## 2023-07-10 NOTE — Telephone Encounter (Signed)
Scheduled remote reviewed. Normal device function.  2 new AHR detections, longest lasting 8 seconds, total AF burden <1%, EGMs consistent with AT/AFL, AF suppression algorithm active.   22 PMT detections following PVC with V-A interval of 280-300 ms, currently programmed PVARP of 275 ms.  Within the monitoring period, HF diagnostics have been abnormal. Routing to triage to consider programming to extend PVARP to 350 ms to reduce PMT.  Next remote 91 days. - CS, CVRS  Patient is not due for follow up with you until December 2024.  Would you like for [name] to bring her in sooner to make PVARP adjustments or just continue to monitor?

## 2023-07-15 ENCOUNTER — Encounter: Payer: Medicare Other | Admitting: *Deleted

## 2023-07-15 ENCOUNTER — Telehealth: Payer: Self-pay | Admitting: Family

## 2023-07-15 ENCOUNTER — Encounter: Payer: Medicare Other | Admitting: Family

## 2023-07-15 VITALS — Ht 63.0 in | Wt 136.0 lb

## 2023-07-15 DIAGNOSIS — I5022 Chronic systolic (congestive) heart failure: Secondary | ICD-10-CM | POA: Diagnosis not present

## 2023-07-15 DIAGNOSIS — Z952 Presence of prosthetic heart valve: Secondary | ICD-10-CM

## 2023-07-15 NOTE — Progress Notes (Deleted)
Advanced Heart Failure Team Note    PCP: Marikay Alar, MD (last seen 05/24 Cardiologist: Tonny Bollman, MD (last seen 03/24) HF Cardiologist: Arvilla Meres, MD (last seen 03/24)  HPI: Ms Badeaux is a 72 y.o. female with a hx of CAD s/p anterior STEMI 5/17 with PCI/DES x 2 to LAD, HFrEF/ischemic cardiomyopathy with LVEF at 20-25%, DM2, stroke, HTN, HLD, anxiety, anemia, depression, OCD, LBBB with CRT-D placement 01/02/22, OSA, and severe mitral regurgitation s/p mTEER with MitraClip 10/04/22.    She underwent successful transcatheter edge-to-edge mitral valve repair 10/04/22 with MitraClip NTW x1 and NT x1 reducing baseline 4+ mitral regurgitation to trace. However. post op echocardiogram 10/05/22. EF 25% with moderate to severe MR felt secondary to SLDA of the second MitraClip placement. Mean gradient at with an average HR at 83bpm. RV normal    TCTS consulted and felt to be a candidate for MVR.Patient underwent MVR with 29 mm Mosaic valve by Dr. Leafy Ro on 10/24/22. She returned to the OR shortly after for control of large vessel bleeder on right inferior pericardial. Moderate pericardial and pleural effusions were evacuated. She did well post-op and was stable for discharge 10/30/22 after diuresis and titration of GDMT.   Follow up 1/24, anxious with worse NYHA III symptoms. Volume OK. Echo showed mitral valve prosthesis functioning normally, EF 20-25%, grade I DD, RV ok.  Echo 02/25/23: EF 30-35% along with mild LVH, mild/ moderate AR and normal functions mitral valve prosthesis.   She presents today for a HF f/u visit with a chief complaint of minimal fatigue with moderate exertion. Chronic in nature. Has associated mild cough, dizziness, slight edema around ankles and chronic difficulty sleeping. Denies any shortness of breath (even when walking up steps), chest pain, palpitations, abdominal distention or weight gain. Participating in cardiac rehab and is enjoying that.  Unable to tolerate CPAP for her sleep apnea.   ROS: All systems negative except as listed in HPI, PMH and Problem List.  SH:  Social History   Socioeconomic History   Marital status: Married    Spouse name: Anmol Decook   Number of children: 0   Years of education: 16   Highest education level: Bachelor's degree (e.g., BA, AB, BS)  Occupational History   Occupation: Retired    Comment: Runner, broadcasting/film/video  Tobacco Use   Smoking status: Former    Current packs/day: 0.00    Average packs/day: 0.5 packs/day for 25.0 years (12.5 ttl pk-yrs)    Types: Cigarettes    Start date: 04/13/1972    Quit date: 04/13/1997    Years since quitting: 26.2   Smokeless tobacco: Never  Vaping Use   Vaping status: Never Used  Substance and Sexual Activity   Alcohol use: Not Currently   Drug use: No   Sexual activity: Not Currently  Other Topics Concern   Not on file  Social History Narrative   Lives in [place] with spouse.  No children.   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   Left-handed   Lives in a two story home       Social Determinants of Health   Financial Resource Strain: Low Risk  (05/30/2023)   Overall Financial Resource Strain (CARDIA)    Difficulty of Paying Living Expenses: Not hard at all  Food Insecurity: No Food Insecurity (05/30/2023)   Hunger Vital Sign    Worried About Running Out of Food in  the Last Year: Never true    Ran Out of Food in the Last Year: Never true  Transportation Needs: No Transportation Needs (05/30/2023)   PRAPARE - Administrator, Civil Service (Medical): No    Lack of Transportation (Non-Medical): No  Physical Activity: Not on file  Stress: No Stress Concern Present (03/30/2022)   [employer] of Occupational Health - Occupational Stress Questionnaire    Feeling of Stress : Not at all  Social Connections: Unknown (03/30/2022)   Social Connection and Isolation  Panel [NHANES]    Frequency of Communication with Friends and Family: Not on file    Frequency of Social Gatherings with Friends and Family: Not on file    Attends Religious Services: Not on file    Active Member of Clubs or Organizations: Not on file    Attends Banker Meetings: Not on file    Marital Status: Married  Intimate Partner Violence: Not At Risk (10/15/2022)   Humiliation, Afraid, Rape, and Kick questionnaire    Fear of Current or Ex-Partner: No    Emotionally Abused: No    Physically Abused: No    Sexually Abused: No    FH:  Family History  Problem Relation Age of Onset   Anxiety disorder Mother    Paranoid behavior Mother    Hypertension Mother    Dementia Mother    High Cholesterol Mother    Diabetes Mother    Hyperlipidemia Mother    Depression Mother    Hypertension Father    High Cholesterol Father    Mood Disorder Sister    Stroke Sister    Anxiety disorder Maternal Aunt    Tuberculosis Paternal Grandfather    Drug abuse Cousin     Past Medical History:  Diagnosis Date   ABLA (acute blood loss anemia) 10/24/2022   AICD (automatic cardioverter/defibrillator) present    Allergic rhinitis 09/25/2022   Back pain    Benign neoplasm of colon 04/27/2008   CAD (coronary artery disease) 04/11/2016   S/p ant STEMI 5/17: LHC >> LAD proximal 80%, mid 80%, distal 50%, ostial D1 60%; LCx with LPDA lesion 30%; RCA Mild calcification with no significant stenosis in a medium caliber, nondominant RCA; LVEF is estimated at 45% with inferoapical and lateral wall akinesis >> PCI: PCI: 3.5 x 24 mm Promus DES to prox LAD, 2.5 x 12 mm Promus DES to mid LAD.   Cerebellar stroke 03/30/2020   2021 MRI - Small, old left cerebellar infarct    Chest pain    Chronic HFrEF (heart failure with reduced ejection fraction) 10/25/2022   Chronic systolic CHF (congestive heart failure) 03/21/2016   Echo 01/30/17: Diff HK, mild focal basal septal hypertrophy, EF 30-35, mild  AI, MAC, mild MR // Echo 06/08/16: Mild focal basal septal hypertrophy, EF 25-30%, diff HK, ant-septal AK, Gr 1 DD, mild AI, MAC, mild MR, PASP 37 mmHg // Echo 03/18/16: EF 30-35%, ant-septal AK, Gr 1 DD, mild MR, severe LAE.     Constipation    Diarrhea 01/02/2023   Dizziness    Dyspnea    Endotracheal tube present 10/24/2022   Essential hypertension 04/27/2008   Well-controlled today.  She will continue valsartan 80 mg twice a day, Imdur 30 mg daily, Lasix 20 mg every other day, [name] 10 mg daily, and carvedilol 12.5 mg twice daily.   Finger fracture 08/15/2021   She reports this is well-healing.  She will continue to see orthopedics.   Generalized anxiety  disorder 04/27/2008   Glaucoma    Gout    History of acute anterior wall MI 03/17/2016   History of colonoscopy 04/27/2008   History of COVID-19 06/27/2022   HLD (hyperlipidemia) 07/28/2015   Check lipid panel.  She will continue Lipitor.   Hot flash, menopausal 02/07/2018   Will check with our clinical pharmacist regarding the black cohosh and primrose in this patient.   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   Iron deficiency anemia, unspecified 04/27/2008   Ischemic cardiomyopathy 10/02/2016   Joint pain    Left bundle branch block 12/03/2018   Chronic.  She will follow with cardiology.   Major depressive disorder 07/28/2015   Chronic ongoing issue.  Has worsened somewhat recently.  Notes passive SI.  Advised if she develops intent or plan to harm herself she needs to go to the emergency room.  I advised her to contact her psychiatrist to arrange for follow-up and she was willing to do so.  She does have protective factors in place including her husband, dog, and nephew.   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  She will follow with cardiology.  We will send her EKG to her psychiatrist and have CMA contact the psychiatry office to inform them that the EKG was faxed and of the results.   Rash 02/07/2018   Possibly related to folliculitis or some other undetermined cause of her rash.  She will trial over-the-counter antibiotic ointment and if not beneficial she will let us know and we can refer her to dermatology.   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 Excell Seltzer and Dr. 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  She will continue [name] 10 mg daily.   Uterine leiomyoma 04/27/2008   Vitamin D deficiency 09/15/2010    Current Outpatient Medications  Medication Sig Dispense Refill   aspirin EC 81 MG tablet Take 1 tablet (81 mg total) by mouth daily. Swallow whole. 30 tablet 12   atorvastatin (LIPITOR) 80 MG tablet Take 1 tablet (80 mg total) by mouth daily. 90 tablet 3   carvedilol (COREG) 12.5 MG tablet Take 0.5 tablets (6.25 mg total) by mouth 2 (two) times daily with a meal. 60 tablet 1   dorzolamide-timolol (COSOPT) 22.3-6.8 MG/ML ophthalmic solution Place 1 drop into both eyes 2 (two) times daily.      furosemide (LASIX) 40 MG tablet Take 40 mg by mouth as needed. (Patient not taking: Reported on 05/24/2023)     gabapentin (NEURONTIN) 400 MG capsule Take 1 capsule (400 mg total) by mouth 2 (two) times daily. 60 capsule 3   isosorbide mononitrate (IMDUR) 30 MG 24 hr tablet Take 30 mg by mouth daily.     latanoprost (XALATAN) 0.005 %  ophthalmic solution Place 1 drop into both eyes at bedtime.     memantine (NAMENDA) 5  MG tablet Take 5 mg by mouth daily. (Patient not taking: Reported on 05/31/2023)     mirtazapine (REMERON) 7.5 MG tablet Take 1 tablet (7.5 mg total) by mouth at bedtime. 30 tablet 2   QUEtiapine (SEROQUEL) 50 MG tablet Take 1-3 tabs nightly for sleep.     spironolactone (ALDACTONE) 25 MG tablet Take 0.5 tablets (12.5 mg total) by mouth daily. (Patient not taking: Reported on 05/24/2023) 45 tablet 3   traZODone (DESYREL) 150 MG tablet Take by mouth at bedtime as needed for sleep.     valsartan (DIOVAN) 80 MG tablet Take 80 mg by mouth daily.     No current facility-administered medications for this visit.   There were no vitals filed for this visit.  Wt Readings from Last 3 Encounters:  05/31/23 133 lb (60.3 kg)  05/24/23 133 lb (60.3 kg)  05/13/23 135 lb 9.6 oz (61.5 kg)   Lab Results  Component Value Date   CREATININE 0.93 05/15/2023   CREATININE 0.84 03/20/2023   CREATININE 1.00 01/07/2023   PHYSICAL EXAM: General:  NAD. No resp difficulty, walked into clinic HEENT: Normal Neck: Supple. No JVD. No lymphadenopathy or thryomegaly appreciated. Cor: PMI nondisplaced. Regular rate & rhythm. No rubs, gallops or murmurs. Lungs: Clear Abdomen: Soft, nontender, nondistended. No hepatosplenomegaly. No bruits or masses.  Extremities: No cyanosis, clubbing, rash, edema Neuro: Alert & oriented x 3, cranial nerves grossly intact. Moves all 4 extremities w/o difficulty. Affect pleasant.   ECG: not done  ASSESSMENT & PLAN:  1. Chronic systolic HF- - initially felt due to ischemic CM. S/p Anterior STEMI 5/17. Now suspect MR major contributing factor. Has LBBB.  - NYHA II - euvolemic today - weight up 5 pounds since last visit 4 months ago - Echo (8/19): EF 30-35%. - Echo 07/17/22 EF 20-25% G2DD, RV ok. Severe central MR  - Echo 10/05/22: EF 25% with moderate to severe MR felt secondary to SLDA of the  second MitraClip placement.  - Echo (1/24): EF 20-25%, grade I DD, RV ok. Mitral Valve prosthesis ok - Echo 02/25/23: EF 30-35% along with mild LVH, mild/ moderate AR and normal functions mitral valve prosthesis. - cMRI 1/23 EF 15% No LGE. RV ok. Severe LV dyssynchrony Moderate MR  - continue spiro 12.5 mg daily. - Continue Coreg 6.25 mg bid - Continue valsartan 80 mg daily; had SOB w/ entresto - Continue Lasix PRN, take 20 KCL when taking Lasix. - was taking jardiance but it's no longer on her list and she is unsure; she will check once she returns home - possible RHC in the future if symptoms worsen - BNP 01/07/23 was 1367.3  2. Mitral regurgitation, severe s/p bioprosthetics MVR  - s/p mTEER with 2 clips on 10/04/22 - Post op echo EF 25% with moderate to severe MR felt secondary to SLDA of the second MitraClip placement. - S/p MVR 12/23 with 29 mm Mosaic valve by Dr. Leafy Ro. Returned to OR  for control of bleeding.  - On Warfarin, INR followed by [facility]madin Clinic   3. Coronary artery disease involving native coronary artery of native heart without angina pectoris - Hx of anterior STEMI in 5/17 tx with DES x 2 to the LAD.   - No LGE on cMRI (1/23) - LHC 9/23 patent LAD stents - No chest pain - saw cardiology Asa Lente) 03/24 - Continue ASA + statin. - participating in cardiac rehab  4. LBBB - s/p CRT-D 3/23 with Dr. Lalla Brothers  - EF improving -  saw EP Lalla Brothers) 03/24   5. HTN- - BP 138/67 - saw PCP Birdie Sons) 05/24; returns later today - BMP 03/20/23 showed sodium 140, potassium 3.8, creatinine 0.84 & GFR 69.20 - will message PCP and ask him to repeat BMP today as she can get it drawn in his office  6. OSA - Unable to tolerate CPAP  Return in 2 months, sooner if needed.

## 2023-07-15 NOTE — Telephone Encounter (Signed)
Patient did not show for her Heart Failure Clinic appointment on 07/15/23.

## 2023-07-15 NOTE — Patient Instructions (Signed)
Discharge Patient Instructions  Patient Details  Name: Melanie Ray MRN: [id number] Date of Birth: 06/07/1951 Referring Provider:  Glori Luis, MD   Number of Visits: 36/36  Reason for Discharge:  Patient reached a stable level of exercise. Patient independent in their exercise. Patient has met program and personal goals.  Diagnosis:  S/P mitral valve replacement   Functional Capacity:  6 Minute Walk     Row Name 01/28/23 1649 07/15/23 1551       6 Minute Walk   Phase Initial Discharge    Distance 820 feet 1100 feet    Distance % Change -- 29 %    Distance Feet Change -- 280 ft    Walk Time 6 minutes 6 minutes    # of Rest Breaks 0 0    MPH 1.55 2.08    METS 1.87 2.69    RPE 11 13    Perceived Dyspnea  1 1    VO2 Peak 6.54 9.42    Symptoms No No    Resting HR 85 bpm 85 bpm    Resting BP 124/72 122/60    Resting Oxygen Saturation  100 % 96 %    Exercise Oxygen Saturation  during 6 min walk 100 % 98 %    Max Ex. HR 91 bpm 103 bpm    Max Ex. BP 112/60 148/60    2 Minute Post BP 104/60 --            Nutrition & Weight - Outcomes:  Pre Biometrics - 01/28/23 1716       Pre Biometrics   Height 5\' 3"  (1.6 m)    Weight 132 lb 1.6 oz (59.9 kg)    Waist Circumference 33 inches    Hip Circumference 36.5 inches    Waist to Hip Ratio 0.9 %    BMI (Calculated) 23.41    Single Leg Stand 7.09 seconds             Post Biometrics - 07/15/23 1608        Post  Biometrics   Height 5\' 3"  (1.6 m)    Weight 136 lb (61.7 kg)    BMI (Calculated) 24.1    Single Leg Stand 9.22 seconds            Goals reviewed with patient; copy given to patient.

## 2023-07-15 NOTE — Progress Notes (Signed)
Daily Session Note  Patient Details  Name: Melanie Ray MRN: [id number] Date of Birth: Jan 07, 1951 Referring Provider:   Flowsheet Row Cardiac Rehab from 01/28/2023 in [facility] Cardiac and Pulmonary Rehab  Referring Provider Excell Seltzer       Encounter Date: 07/15/2023  Check In:  Session Check In - 07/15/23 1553       Check-In   Supervising physician immediately available to respond to emergencies See telemetry face sheet for immediately available ER MD    Location [facility]-Cardiac & Pulmonary Rehab    Staff Present Cora Collum, RN, BSN, CCRP;Joseph Hood, RCP,RRT,BSRT;Maxon Adrian BS, , Exercise Physiologist    Virtual Visit No    Medication changes reported     No    Fall or balance concerns reported    No    Warm-up and Cool-down Performed on first and last piece of equipment    Resistance Training Performed Yes    VAD Patient? No    PAD/SET Patient? No      Pain Assessment   Currently in Pain? No/denies                Social History   Tobacco Use  Smoking Status Former   Current packs/day: 0.00   Average packs/day: 0.5 packs/day for 25.0 years (12.5 ttl pk-yrs)   Types: Cigarettes   Start date: 04/13/1972   Quit date: 04/13/1997   Years since quitting: 26.2  Smokeless Tobacco Never    Goals Met:  Independence with exercise equipment Exercise tolerated well No report of concerns or symptoms today  Goals Unmet:  Not Applicable  Comments:   6 Minute Walk     Row Name 01/28/23 1649 07/15/23 1551       6 Minute Walk   Phase Initial Discharge    Distance 820 feet 1100 feet    Distance % Change -- 29 %    Distance Feet Change -- 280 ft    Walk Time 6 minutes 6 minutes    # of Rest Breaks 0 0    MPH 1.55 2.08    METS 1.87 2.69    RPE 11 13    Perceived Dyspnea  1 1    VO2 Peak 6.54 9.42    Symptoms No No    Resting HR 85 bpm 85 bpm    Resting BP 124/72 122/60    Resting Oxygen Saturation  100 % 96 %    Exercise Oxygen Saturation  during 6 min walk  100 % 98 %    Max Ex. HR 91 bpm 103 bpm    Max Ex. BP 112/60 148/60    2 Minute Post BP 104/60 --            Pt able to follow exercise prescription today without complaint.  Will continue to monitor for progression.    Dr. Bethann Punches is Medical Director for [facility] Cardiac Rehabilitation.  Dr. Vida Rigger is Medical Director for [facility] Pulmonary Rehabilitation.

## 2023-07-15 NOTE — Telephone Encounter (Signed)
Spoke with patient.  She can only come on Thursday.  Appt made for 07/18/23 at 1:30pm with device clinic to adjust.  Industry aware to be present.

## 2023-07-17 ENCOUNTER — Encounter: Payer: Medicare Other | Admitting: *Deleted

## 2023-07-17 DIAGNOSIS — Z952 Presence of prosthetic heart valve: Secondary | ICD-10-CM | POA: Diagnosis not present

## 2023-07-17 DIAGNOSIS — I5022 Chronic systolic (congestive) heart failure: Secondary | ICD-10-CM

## 2023-07-17 NOTE — Progress Notes (Signed)
Daily Session Note  Patient Details  Name: Melanie Ray MRN: [id number] Date of Birth: Mar 08, 1951 Referring Provider:   Flowsheet Row Cardiac Rehab from 01/28/2023 in [facility] Cardiac and Pulmonary Rehab  Referring Provider Excell Seltzer       Encounter Date: 07/17/2023  Check In:  Session Check In - 07/17/23 1552       Check-In   Supervising physician immediately available to respond to emergencies See telemetry face sheet for immediately available ER MD    Location [facility]-Cardiac & Pulmonary Rehab    Staff Present Maxon Suzzette Righter, , Exercise Physiologist;Courage Biglow Jewel Baize, RN BSN;Joseph Reino [place], [place]    Virtual Visit No    Medication changes reported     No    Fall or balance concerns reported    No    Warm-up and Cool-down Performed on first and last piece of equipment    Resistance Training Performed Yes    VAD Patient? No    PAD/SET Patient? No      Pain Assessment   Currently in Pain? No/denies                Social History   Tobacco Use  Smoking Status Former   Current packs/day: 0.00   Average packs/day: 0.5 packs/day for 25.0 years (12.5 ttl pk-yrs)   Types: Cigarettes   Start date: 04/13/1972   Quit date: 04/13/1997   Years since quitting: 26.2  Smokeless Tobacco Never    Goals Met:  Independence with exercise equipment Exercise tolerated well No report of concerns or symptoms today Strength training completed today  Goals Unmet:  Not Applicable  Comments: Pt able to follow exercise prescription today without complaint.  Will continue to monitor for progression.    Dr. Bethann Punches is Medical Director for [facility] Cardiac Rehabilitation.  Dr. Vida Rigger is Medical Director for [facility] Pulmonary Rehabilitation.

## 2023-07-18 ENCOUNTER — Telehealth: Payer: Self-pay

## 2023-07-18 ENCOUNTER — Ambulatory Visit: Payer: Medicare Other | Attending: Cardiology

## 2023-07-18 DIAGNOSIS — I447 Left bundle-branch block, unspecified: Secondary | ICD-10-CM | POA: Insufficient documentation

## 2023-07-18 LAB — CUP PACEART INCLINIC DEVICE CHECK
Battery Remaining Longevity: 55 mo
Brady Statistic RA Percent Paced: 93 %
Brady Statistic RV Percent Paced: 99 %
Date Time Interrogation Session: 20240912165736
HighPow Impedance: 57.375
Implantable Lead Connection Status: 753985
Implantable Lead Connection Status: 753985
Implantable Lead Connection Status: 753985
Implantable Lead Implant Date: 20230228
Implantable Lead Implant Date: 20230228
Implantable Lead Implant Date: 20230228
Implantable Lead Location: 753858
Implantable Lead Location: 753859
Implantable Lead Location: 753860
Implantable Lead Model: 3830
Implantable Pulse Generator Implant Date: 20230228
Lead Channel Impedance Value: 400 Ohm
Lead Channel Impedance Value: 425 Ohm
Lead Channel Impedance Value: 562.5 Ohm
Lead Channel Pacing Threshold Amplitude: 0.5 V
Lead Channel Pacing Threshold Amplitude: 0.5 V
Lead Channel Pacing Threshold Amplitude: 0.75 V
Lead Channel Pacing Threshold Amplitude: 0.75 V
Lead Channel Pacing Threshold Amplitude: 0.75 V
Lead Channel Pacing Threshold Amplitude: 0.75 V
Lead Channel Pacing Threshold Pulse Width: 0.5 ms
Lead Channel Pacing Threshold Pulse Width: 0.5 ms
Lead Channel Pacing Threshold Pulse Width: 0.5 ms
Lead Channel Pacing Threshold Pulse Width: 0.5 ms
Lead Channel Pacing Threshold Pulse Width: 0.5 ms
Lead Channel Pacing Threshold Pulse Width: 0.5 ms
Lead Channel Sensing Intrinsic Amplitude: 12 mV
Lead Channel Sensing Intrinsic Amplitude: 4.4 mV
Lead Channel Setting Pacing Amplitude: 2 V
Lead Channel Setting Pacing Amplitude: 2 V
Lead Channel Setting Pacing Amplitude: 2 V
Lead Channel Setting Pacing Pulse Width: 0.5 ms
Lead Channel Setting Pacing Pulse Width: 0.5 ms
Lead Channel Setting Sensing Sensitivity: 0.5 mV
Pulse Gen Serial Number: 8904264
Zone Setting Status: 755011

## 2023-07-18 NOTE — Progress Notes (Signed)
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  [place] ([address] Jude Rep present/assist with interrogation today).  PMT episodes determined false due to patient having failed V-A conduction test.  No change to PVARP made at this time. Thresholds and sensing consistent with previous device measurements. Lead impedance trends stable over time.   AT/AF <1%. Note patient had 19 AMS episodes since January.  Longest 9 hours 50 minutes in April.  All appear Aflutter/Fib.  Most recent June 2024.  AF/FLUTTER is new onset.  Was on [facility] for MVP and clip placement but d/c'ed by Dr. Excell Seltzer in April 2024.  No OAC therapy since 02/2023.  1 HVR episode shows AF with RVR.  Patient bi-ventricularly pacing 99% of the time. Reviewed new onset hx with Dr. Ladona Ridgel.  Pt CHADSVASC of 5, with CVA history.  Per Dr. Ladona Ridgel, okay to review with Dr. Lalla Brothers when he is back in the office.    Device programmed with appropriate safety margins. Heart failure diagnostics reviewed with patient, beginning to trend down with signs of early fluid retention.  Patient had stopped her Lasix on her own.  Encouraged to restart and limit sodium in diet.  No changes made this session.  Estimated longevity 4.6- 5 years.  Patient enrolled in remote follow up. Plan to check device remotely in 3 months and see in office in 6 months. Patient education completed.

## 2023-07-18 NOTE — Telephone Encounter (Signed)
Patient brought in to device clinic acutely today to evaluate PMT and PVARP settings.  PMT determined not to be true PMT.  However, did note on interrogation today that patient has had episodes of new onset Aflutter/fib.    AT/AF <1%. Note patient had 19 AMS episodes since January.  Longest 9 hours 50 minutes in April.  All appear Aflutter/Fib.  Most recent June 2024.  AF/FLUTTER is new onset.  Was on [facility] for MVP and clip placement but d/c'ed by Dr. Excell Seltzer in April 2024.  No OAC therapy since 02/2023.  1 HVR episode shows AF with RVR.  Patient bi-ventricularly pacing 99% of the time.   Reviewed new onset hx with Dr. Ladona Ridgel.  Pt CHADSVASC of 5, with CVA history.  Per Dr. Ladona Ridgel, okay to review with Dr. Lalla Brothers when he is back in the office to determine new start on [facility].

## 2023-07-22 ENCOUNTER — Encounter: Payer: Medicare Other | Admitting: *Deleted

## 2023-07-22 DIAGNOSIS — Z952 Presence of prosthetic heart valve: Secondary | ICD-10-CM | POA: Diagnosis not present

## 2023-07-22 DIAGNOSIS — I5022 Chronic systolic (congestive) heart failure: Secondary | ICD-10-CM | POA: Diagnosis not present

## 2023-07-22 NOTE — Progress Notes (Signed)
Daily Session Note  Patient Details  Name: Caroleena Dederick MRN: [id number] Date of Birth: 03-31-1951 Referring Provider:   Flowsheet Row Cardiac Rehab from 01/28/2023 in [facility] Cardiac and Pulmonary Rehab  Referring Provider Excell Seltzer       Encounter Date: 07/22/2023  Check In:  Session Check In - 07/22/23 1542       Check-In   Supervising physician immediately available to respond to emergencies See telemetry face sheet for immediately available ER MD    Location [facility]-Cardiac & Pulmonary Rehab    Staff Present Maxon Suzzette Righter, , Exercise Physiologist;Marcellis Frampton Jewel Baize, RN BSN;Joseph Reino [place], [place]    Virtual Visit No    Medication changes reported     No    Fall or balance concerns reported    No    Warm-up and Cool-down Performed on first and last piece of equipment    Resistance Training Performed Yes    VAD Patient? No    PAD/SET Patient? No      Pain Assessment   Currently in Pain? No/denies                Social History   Tobacco Use  Smoking Status Former   Current packs/day: 0.00   Average packs/day: 0.5 packs/day for 25.0 years (12.5 ttl pk-yrs)   Types: Cigarettes   Start date: 04/13/1972   Quit date: 04/13/1997   Years since quitting: 26.2  Smokeless Tobacco Never    Goals Met:  Independence with exercise equipment Exercise tolerated well No report of concerns or symptoms today Strength training completed today  Goals Unmet:  Not Applicable  Comments: Pt able to follow exercise prescription today without complaint.  Will continue to monitor for progression.    Dr. Bethann Punches is Medical Director for [facility] Cardiac Rehabilitation.  Dr. Vida Rigger is Medical Director for [facility] Pulmonary Rehabilitation.

## 2023-07-24 ENCOUNTER — Encounter: Payer: Medicare Other | Admitting: *Deleted

## 2023-07-24 DIAGNOSIS — Z952 Presence of prosthetic heart valve: Secondary | ICD-10-CM | POA: Diagnosis not present

## 2023-07-24 DIAGNOSIS — I5022 Chronic systolic (congestive) heart failure: Secondary | ICD-10-CM | POA: Diagnosis not present

## 2023-07-24 NOTE — Progress Notes (Signed)
Daily Session Note  Patient Details  Name: Melanie Ray MRN: [id number] Date of Birth: [birth year] Referring Provider:   Flowsheet Row Cardiac Rehab from 01/28/2023 in [facility] Cardiac and Pulmonary Rehab  Referring Provider Excell Seltzer       Encounter Date: 07/24/2023  Check In:  Session Check In - 07/24/23 1603       Check-In   Supervising physician immediately available to respond to emergencies See telemetry face sheet for immediately available ER MD    Location [facility]-Cardiac & Pulmonary Rehab    Staff Present Maxon Suzzette Righter, , Exercise Physiologist;Jamion Carter Jewel Baize, RN BSN;Joseph Reino [place], [place]    Virtual Visit No    Medication changes reported     No    Fall or balance concerns reported    No    Warm-up and Cool-down Performed on first and last piece of equipment    Resistance Training Performed Yes    VAD Patient? No    PAD/SET Patient? No      Pain Assessment   Currently in Pain? No/denies                Social History   Tobacco Use  Smoking Status Former   Current packs/day: 0.00   Average packs/day: 0.5 packs/day for 25.0 years (12.5 ttl pk-yrs)   Types: Cigarettes   Start date: 04/13/1972   Quit date: 04/13/1997   Years since quitting: 26.2  Smokeless Tobacco Never    Goals Met:  Independence with exercise equipment Exercise tolerated well No report of concerns or symptoms today Strength training completed today  Goals Unmet:  Not Applicable  Comments: Pt able to follow exercise prescription today without complaint.  Will continue to monitor for progression.    Dr. Bethann Punches is Medical Director for [facility] Cardiac Rehabilitation.  Dr. Vida Rigger is Medical Director for [facility] Pulmonary Rehabilitation.

## 2023-07-25 DIAGNOSIS — N951 Menopausal and female climacteric states: Secondary | ICD-10-CM | POA: Diagnosis not present

## 2023-07-26 ENCOUNTER — Other Ambulatory Visit (HOSPITAL_COMMUNITY): Payer: Self-pay | Admitting: Internal Medicine

## 2023-07-31 ENCOUNTER — Encounter: Payer: Medicare Other | Admitting: *Deleted

## 2023-07-31 DIAGNOSIS — Z952 Presence of prosthetic heart valve: Secondary | ICD-10-CM | POA: Diagnosis not present

## 2023-07-31 DIAGNOSIS — I5022 Chronic systolic (congestive) heart failure: Secondary | ICD-10-CM | POA: Diagnosis not present

## 2023-07-31 NOTE — Progress Notes (Signed)
Daily Session Note  Patient Details  Name: Melanie Ray MRN: [id number] Date of Birth: [birth year] Referring Provider:   Flowsheet Row Cardiac Rehab from 01/28/2023 in [facility] Cardiac and Pulmonary Rehab  Referring Provider Excell Seltzer       Encounter Date: 07/31/2023  Check In:  Session Check In - 07/31/23 1548       Check-In   Supervising physician immediately available to respond to emergencies See telemetry face sheet for immediately available ER MD    Location [facility]-Cardiac & Pulmonary Rehab    Staff Present Tommye Standard, BS, ACSM CEP, Exercise Physiologist;Megan Katrinka Blazing, RN, ADN;Marcianne Ozbun Jewel Baize, RN BSN    Virtual Visit No    Medication changes reported     No    Fall or balance concerns reported    No    Warm-up and Cool-down Performed on first and last piece of equipment    Resistance Training Performed Yes    VAD Patient? No    PAD/SET Patient? No      Pain Assessment   Currently in Pain? No/denies                Social History   Tobacco Use  Smoking Status Former   Current packs/day: 0.00   Average packs/day: 0.5 packs/day for 25.0 years (12.5 ttl pk-yrs)   Types: Cigarettes   Start date: 04/13/1972   Quit date: 04/13/1997   Years since quitting: 26.3  Smokeless Tobacco Never    Goals Met:  Independence with exercise equipment Exercise tolerated well No report of concerns or symptoms today Strength training completed today  Goals Unmet:  Not Applicable  Comments: Pt able to follow exercise prescription today without complaint.  Will continue to monitor for progression.    Dr. Bethann Punches is Medical Director for [facility] Cardiac Rehabilitation.  Dr. Vida Rigger is Medical Director for [facility] Pulmonary Rehabilitation.

## 2023-08-05 ENCOUNTER — Ambulatory Visit: Payer: Medicare Other

## 2023-08-06 DIAGNOSIS — F339 Major depressive disorder, recurrent, unspecified: Secondary | ICD-10-CM | POA: Diagnosis not present

## 2023-08-06 DIAGNOSIS — I10 Essential (primary) hypertension: Secondary | ICD-10-CM | POA: Diagnosis not present

## 2023-08-06 DIAGNOSIS — G4733 Obstructive sleep apnea (adult) (pediatric): Secondary | ICD-10-CM | POA: Diagnosis not present

## 2023-08-06 DIAGNOSIS — Z8659 Personal history of other mental and behavioral disorders: Secondary | ICD-10-CM | POA: Diagnosis not present

## 2023-08-06 DIAGNOSIS — G3184 Mild cognitive impairment, so stated: Secondary | ICD-10-CM | POA: Diagnosis not present

## 2023-08-07 ENCOUNTER — Encounter: Payer: Medicare Other | Attending: Cardiovascular Disease | Admitting: *Deleted

## 2023-08-07 ENCOUNTER — Encounter: Payer: Self-pay | Admitting: *Deleted

## 2023-08-07 DIAGNOSIS — Z952 Presence of prosthetic heart valve: Secondary | ICD-10-CM | POA: Insufficient documentation

## 2023-08-07 NOTE — Progress Notes (Signed)
Cardiac Individual Treatment Plan  Patient Details  Name: Melanie Ray MRN: [id number] Date of Birth: [birth year] Referring Provider:   Flowsheet Row Cardiac Rehab from 01/28/2023 in [facility] Cardiac and Pulmonary Rehab  Referring Provider Excell Seltzer       Initial Encounter Date:  Flowsheet Row Cardiac Rehab from 01/28/2023 in [facility] Cardiac and Pulmonary Rehab  Date 01/28/23       Visit Diagnosis: S/P mitral valve replacement  Patient's Home Medications on Admission:  Current Outpatient Medications:    aspirin EC 81 MG tablet, Take 1 tablet (81 mg total) by mouth daily. Swallow whole., Disp: 30 tablet, Rfl: 12   atorvastatin (LIPITOR) 80 MG tablet, Take 1 tablet (80 mg total) by mouth daily., Disp: 90 tablet, Rfl: 3   carvedilol (COREG) 12.5 MG tablet, Take 0.5 tablets (6.25 mg total) by mouth 2 (two) times daily with a meal., Disp: 60 tablet, Rfl: 1   dorzolamide-timolol (COSOPT) 22.3-6.8 MG/ML ophthalmic solution, Place 1 drop into both eyes 2 (two) times daily. , Disp: , Rfl:    furosemide (LASIX) 40 MG tablet, Take 40 mg by mouth as needed. (Patient not taking: Reported on 05/24/2023), Disp: , Rfl:    gabapentin (NEURONTIN) 400 MG capsule, Take 1 capsule (400 mg total) by mouth 2 (two) times daily., Disp: 60 capsule, Rfl: 3   isosorbide mononitrate (IMDUR) 30 MG 24 hr tablet, Take 30 mg by mouth daily., Disp: , Rfl:    latanoprost (XALATAN) 0.005 % ophthalmic solution, Place 1 drop into both eyes at bedtime., Disp: , Rfl:    memantine (NAMENDA) 5 MG tablet, Take 5 mg by mouth daily. (Patient not taking: Reported on 05/31/2023), Disp: , Rfl:    mirtazapine (REMERON) 7.5 MG tablet, Take 1 tablet (7.5 mg total) by mouth at bedtime., Disp: 30 tablet, Rfl: 2   QUEtiapine (SEROQUEL) 50 MG tablet, Take 1-3 tabs nightly for sleep., Disp: , Rfl:    spironolactone (ALDACTONE) 25 MG tablet, Take 0.5 tablets (12.5 mg total) by mouth daily. (Patient not taking: Reported on 05/24/2023), Disp: 45  tablet, Rfl: 3   traZODone (DESYREL) 150 MG tablet, Take by mouth at bedtime as needed for sleep., Disp: , Rfl:    valsartan (DIOVAN) 80 MG tablet, Take 1 tablet by mouth twice daily, Disp: 180 tablet, Rfl: 0  Past Medical History: Past Medical History:  Diagnosis Date   ABLA (acute blood loss anemia) 10/24/2022   AICD (automatic cardioverter/defibrillator) present    Allergic rhinitis 09/25/2022   Back pain    Benign neoplasm of colon 04/27/2008   CAD (coronary artery disease) 04/11/2016   S/p ant STEMI 5/17: LHC >> LAD proximal 80%, mid 80%, distal 50%, ostial D1 60%; LCx with LPDA lesion 30%; RCA Mild calcification with no significant stenosis in a medium caliber, nondominant RCA; LVEF is estimated at 45% with inferoapical and lateral wall akinesis >> PCI: PCI: 3.5 x 24 mm Promus DES to prox LAD, 2.5 x 12 mm Promus DES to mid LAD.   Cerebellar stroke 03/30/2020   2021 MRI - Small, old left cerebellar infarct    Chest pain    Chronic HFrEF (heart failure with reduced ejection fraction) 10/25/2022   Chronic systolic CHF (congestive heart failure) 03/21/2016   Echo 01/30/17: Diff HK, mild focal basal septal hypertrophy, EF 30-35, mild AI, MAC, mild MR // Echo 06/08/16: Mild focal basal septal hypertrophy, EF 25-30%, diff HK, ant-septal AK, Gr 1 DD, mild AI, MAC, mild MR, PASP 37 mmHg // Echo  03/18/16: EF 30-35%, ant-septal AK, Gr 1 DD, mild MR, severe LAE.     Constipation    Diarrhea 01/02/2023   Dizziness    Dyspnea    Endotracheal tube present 10/24/2022   Essential hypertension 04/27/2008   Well-controlled today.  She will continue valsartan 80 mg twice a day, Imdur 30 mg daily, Lasix 20 mg every other day, [name] 10 mg daily, and carvedilol 12.5 mg twice daily.   Finger fracture 08/15/2021   She reports this is well-healing.  She will continue to see orthopedics.   Generalized anxiety disorder 04/27/2008   Glaucoma    Gout    History of acute anterior wall MI 03/17/2016   History  of colonoscopy 04/27/2008   History of COVID-19 06/27/2022   HLD (hyperlipidemia) 07/28/2015   Check lipid panel.  She will continue Lipitor.   Hot flash, menopausal 02/07/2018   Will check with our clinical pharmacist regarding the black cohosh and primrose in this patient.   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   Iron deficiency anemia, unspecified 04/27/2008   Ischemic cardiomyopathy 10/02/2016   Joint pain    Left bundle branch block 12/03/2018   Chronic.  She will follow with cardiology.   Major depressive disorder 07/28/2015   Chronic ongoing issue.  Has worsened somewhat recently.  Notes passive SI.  Advised if she develops intent or plan to harm herself she needs to go to the emergency room.  I advised her to contact her psychiatrist to arrange for follow-up and she was willing to do so.  She does have protective factors in place including her husband, dog, and nephew.   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  She will follow with cardiology.  We will send her EKG to her psychiatrist and have CMA contact the psychiatry office to inform them that the EKG was faxed and of the results.   Rash 02/07/2018   Possibly related to folliculitis or some other undetermined cause of her rash.  She will trial over-the-counter antibiotic ointment and if not beneficial she will let us know and we can refer her to dermatology.   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 Excell Seltzer and Dr. 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  She will continue [name] 10 mg daily.   Uterine leiomyoma 04/27/2008   Vitamin D deficiency 09/15/2010    Tobacco Use: Social History   Tobacco Use  Smoking Status Former   Current packs/day: 0.00   Average packs/day: 0.5 packs/day for 25.0 years (12.5 ttl pk-yrs)   Types: Cigarettes   Start date: 04/13/1972   Quit date: 04/13/1997   Years since quitting: 26.3  Smokeless Tobacco Never    Labs: Review Flowsheet  More data exists      Latest Ref Rng & Units 10/15/2022 10/24/2022 10/25/2022 10/26/2022 03/20/2023  Labs for  [facility] Cardiac and Pulmonary Rehab  Cholestrol 0 - 200 mg/dL - - - - 147   LDL (calc) 0 - 99 mg/dL - - - - 75   HDL-C >82.95 mg/dL - - - - 62.13   Trlycerides 0.0 - 149.0 mg/dL - - - - 08.6   Hemoglobin A1c 4.6 - 6.5 % - - - - 5.9   PH, Arterial 7.35 - 7.45 - 7.316  7.349  7.348  7.380  7.437  7.379  7.263  - - -  PCO2 arterial 32 - 48 mmHg - 40.6  38.3  35.2  35.7  32.0  34.0  42.2  - - -  Bicarbonate 20.0 - 28.0 mmol/L 25.4  25.8  20.7  21.1  19.3  21.4  21.6  27.8  20.0  19.1  - - -  TCO2 22 - 32 mmol/L 27  27  22  22  20  23  23  24   49  29  21  20  20  23   - - -  Acid-base deficit 0.0 - 2.0 mmol/L - 5.0  4.0  6.0  4.0  2.0  5.0  7.0  - - -  O2 Saturation % 64  64  98  79  98  98  93  100  81  100  100  70.1  78.2  -    Details       Multiple values from one day are sorted in reverse-chronological order          Exercise Target Goals: Exercise Program Goal: Individual exercise prescription set using results from initial 6 min walk test and THRR while considering  patient's activity barriers and safety.   Exercise Prescription Goal: Initial exercise prescription builds to 30-45 minutes a day of aerobic activity, 2-3 days per week.  Home  exercise guidelines will be given to patient during program as part of exercise prescription that the participant will acknowledge.   Education: Aerobic Exercise: - Group verbal and visual presentation on the components of exercise prescription. Introduces F.I.T.T principle from ACSM for exercise prescriptions.  Reviews F.I.T.T. principles of aerobic exercise including progression. Written material given at graduation.   Education: Resistance Exercise: - Group verbal and visual presentation on the components of exercise prescription. Introduces F.I.T.T principle from ACSM for exercise prescriptions  Reviews F.I.T.T. principles of resistance exercise including progression. Written material given at graduation.    Education: Exercise & Equipment Safety: - Individual verbal instruction and demonstration of equipment use and safety with use of the equipment. Flowsheet Row Cardiac Rehab from 01/28/2023 in [facility] Cardiac and Pulmonary Rehab  Date 01/28/23  Educator [facility]  Instruction Review Code 1- Verbalizes Understanding       Education: Exercise Physiology & General Exercise Guidelines: - Group verbal and written instruction with models to review the exercise physiology of the cardiovascular system and associated critical values. Provides general exercise guidelines with specific guidelines to those with heart or lung disease.    Education: Flexibility, Balance, Mind/Body Relaxation: - Group verbal and visual presentation with interactive activity on the components of exercise prescription. Introduces F.I.T.T principle from ACSM for exercise prescriptions. Reviews F.I.T.T. principles of flexibility and balance exercise training including progression. Also discusses the mind body connection.  Reviews various relaxation techniques to help reduce and manage stress (i.e. Deep breathing, progressive muscle relaxation, and visualization). Balance handout provided to take home. Written material given at  graduation.   Activity Barriers & Risk Stratification:   6  Minute Walk:  6 Minute Walk     Row Name 07/15/23 1551         6 Minute Walk   Phase Discharge     Distance 1100 feet     Distance % Change 29 %     Distance Feet Change 280 ft     Walk Time 6 minutes     # of Rest Breaks 0     MPH 2.08     METS 2.69     RPE 13     Perceived Dyspnea  1     VO2 Peak 9.42     Symptoms No     Resting HR 85 bpm     Resting BP 122/60     Resting Oxygen Saturation  96 %     Exercise Oxygen Saturation  during 6 min walk 98 %     Max Ex. HR 103 bpm     Max Ex. BP 148/60              Oxygen Initial Assessment:   Oxygen Re-Evaluation:   Oxygen Discharge (Final Oxygen Re-Evaluation):   Initial Exercise Prescription:   Perform Capillary Blood Glucose checks as needed.  Exercise Prescription Changes:   Exercise Prescription Changes     Row Name 02/11/23 1400 02/25/23 1500 03/12/23 0800 03/18/23 1600 03/26/23 0800     Response to Exercise   Blood Pressure (Admit) 132/62 128/68 130/60 -- 128/54   Blood Pressure (Exercise) 136/60 142/62 138/60 -- --   Blood Pressure (Exit) 130/64 148/60 124/82 -- 128/60   Heart Rate (Admit) 71 bpm 71 bpm 68 bpm -- 82 bpm   Heart Rate (Exercise) 94 bpm 94 bpm 102 bpm -- 97 bpm   Heart Rate (Exit) 82 bpm 63 bpm 79 bpm -- 78 bpm   Rating of Perceived Exertion (Exercise) 13 13 13  -- 13   Symptoms none none none -- none   Comments 2nd full day of exercise -- -- -- --   Duration Progress to 30 minutes of  aerobic without signs/symptoms of physical distress Continue with 30 min of aerobic exercise without signs/symptoms of physical distress. Continue with 30 min of aerobic exercise without signs/symptoms of physical distress. -- Continue with 30 min of aerobic exercise without signs/symptoms of physical distress.   Intensity THRR unchanged THRR unchanged THRR unchanged -- THRR unchanged     Progression   Progression Continue to progress  workloads to maintain intensity without signs/symptoms of physical distress. Continue to progress workloads to maintain intensity without signs/symptoms of physical distress. Continue to progress workloads to maintain intensity without signs/symptoms of physical distress. -- Continue to progress workloads to maintain intensity without signs/symptoms of physical distress.   Average METs 1.72 1.95 2.11 -- 1.97     Resistance Training   Training Prescription Yes Yes Yes -- Yes   Weight 2 lb 2 lb 2 lb -- 2 lb   Reps 10-15 10-15 10-15 -- 10-15     Interval Training   Interval Training No No No -- No     Treadmill   MPH -- 0.7 0.7 -- 0.7   Grade -- 0 0 -- 0   Minutes -- 15 15 -- 15   METs -- 1.54 1.5 -- 1.54     NuStep   Level 1 2 2  -- --   Minutes 15 15 15  -- --   METs 1.9 2.5 2.6 -- --     REL-XR   Level -- 1 -- --  1   Minutes -- 15 -- -- 15   METs -- 1.6 -- -- 2.4     T5 Nustep   Level 1 -- 1 -- --   Minutes 15 -- 15 -- --   METs 1.8 -- 1.7 -- --     Track   Laps 14 22 30  -- --   Minutes 15 15 15  -- --   METs 1.76 2.2 2.63 -- --     Home Exercise Plan   Plans to continue exercise at -- -- -- Home (comment)  walking, possibly looking into [facility] (comment)  walking, possibly looking into [employer]   Frequency -- -- -- Add 2 additional days to program exercise sessions. Add 2 additional days to program exercise sessions.   Initial Home Exercises Provided -- -- -- 03/18/23 03/18/23     Oxygen   Maintain Oxygen Saturation 88% or higher 88% or higher 88% or higher 88% or higher 88% or higher    Row Name 04/09/23 0800 04/23/23 1500 05/10/23 0700 05/22/23 1400 06/06/23 1500     Response to Exercise   Blood Pressure (Admit) 102/60 114/66 130/70 124/58 114/58   Blood Pressure (Exercise) -- 142/72 140/70 144/66 140/70   Blood Pressure (Exit) 122/72 100/60 124/66 134/58 138/70   Heart Rate (Admit) 88 bpm 85 bpm 87 bpm 101 bpm 84 bpm   Heart Rate (Exercise) 104 bpm 100 bpm  94 bpm 100 bpm 107 bpm   Heart Rate (Exit) 86 bpm 80 bpm 80 bpm 94 bpm 85 bpm   Rating of Perceived Exertion (Exercise) 13 13 13 13 13    Symptoms none none none none none   Duration Continue with 30 min of aerobic exercise without signs/symptoms of physical distress. Continue with 30 min of aerobic exercise without signs/symptoms of physical distress. Continue with 30 min of aerobic exercise without signs/symptoms of physical distress. Continue with 30 min of aerobic exercise without signs/symptoms of physical distress. Continue with 30 min of aerobic exercise without signs/symptoms of physical distress.   Intensity THRR unchanged THRR unchanged THRR unchanged THRR unchanged THRR unchanged     Progression   Progression Continue to progress workloads to maintain intensity without signs/symptoms of physical distress. Continue to progress workloads to maintain intensity without signs/symptoms of physical distress. Continue to progress workloads to maintain intensity without signs/symptoms of physical distress. Continue to progress workloads to maintain intensity without signs/symptoms of physical distress. Continue to progress workloads to maintain intensity without signs/symptoms of physical distress.   Average METs 2.66 2.74 2.24 2.16 2.07     Resistance Training   Training Prescription Yes Yes Yes Yes Yes   Weight 2 lb 2 lb 2 lb 2 lb 2 lb   Reps 10-15 10-15 10-15 10-15 10-15     Interval Training   Interval Training No No No No No     Treadmill   MPH -- -- -- -- 1   Grade -- -- -- -- 0   Minutes -- -- -- -- 15   METs -- -- -- -- 1.77     NuStep   Level 2 -- 1 1 1    Minutes 15 -- 15 15 15    METs 2.7 -- 2.24 2.4 --     Arm Ergometer   Level -- 1 -- -- --   Minutes -- 15 -- -- --   METs -- 2.5 -- -- --     REL-XR   Level -- 1 1 -- 1  Minutes -- 15 15 -- 15   METs -- 3 -- -- 1.3     Track   Laps 30 35 30 17 35   Minutes 15 15 15 15 15    METs 2.63 2.9 2.13 1.92 2.9     Home  Exercise Plan   Plans to continue exercise at Home (comment)  walking, possibly looking into [facility] (comment)  walking, possibly looking into [facility] (comment)  walking, possibly looking into [facility] (comment)  walking, possibly looking into [facility] (comment)  walking, possibly looking into [employer]   Frequency Add 2 additional days to program exercise sessions. Add 2 additional days to program exercise sessions. Add 2 additional days to program exercise sessions. Add 2 additional days to program exercise sessions. Add 2 additional days to program exercise sessions.   Initial Home Exercises Provided 03/18/23 03/18/23 03/18/23 03/18/23 03/18/23     Oxygen   Maintain Oxygen Saturation 88% or higher 88% or higher 88% or higher 88% or higher 88% or higher    Row Name 06/19/23 1300 07/02/23 1300 07/17/23 1000         Response to Exercise   Blood Pressure (Admit) 142/70 146/60 128/62     Blood Pressure (Exit) 124/70 152/64 122/60     Heart Rate (Admit) 92 bpm 99 bpm 96 bpm     Heart Rate (Exercise) 102 bpm 110 bpm 104 bpm     Heart Rate (Exit) 94 bpm 95 bpm 87 bpm     Oxygen Saturation (Admit) -- -- 96 %     Oxygen Saturation (Exercise) -- -- 96 %     Oxygen Saturation (Exit) -- -- 98 %     Rating of Perceived Exertion (Exercise) 13 13 15      Symptoms none none none     Duration Continue with 30 min of aerobic exercise without signs/symptoms of physical distress. Continue with 30 min of aerobic exercise without signs/symptoms of physical distress. Continue with 30 min of aerobic exercise without signs/symptoms of physical distress.     Intensity THRR unchanged THRR unchanged THRR unchanged       Progression   Progression Continue to progress workloads to maintain intensity without signs/symptoms of physical distress. Continue to progress workloads to maintain intensity without signs/symptoms of physical distress. Continue to progress workloads to maintain intensity  without signs/symptoms of physical distress.     Average METs 2.25 1.97 2.1       Resistance Training   Training Prescription Yes Yes Yes     Weight 2 lb 2 lb 2 lb     Reps 10-15 10-15 10-15       Interval Training   Interval Training No No No       Treadmill   MPH 0.6 0.8 --     Grade 0 0 --     Minutes 15 10 --     METs 1.46 1.61 --       Recumbant Bike   Level -- 1 --     Watts -- 15 --     Minutes -- 15 --       REL-XR   Level 1 -- 1     Minutes 15 -- 15     METs 2.4 -- --       Biostep-RELP   Level -- -- 1     Minutes -- -- 15     METs -- -- 2       Track  Laps 35 24 30     Minutes 15 15 15      METs 2.9 2.31 2.63       Home Exercise Plan   Plans to continue exercise at Home (comment)  walking, possibly looking into [facility] (comment)  walking, possibly looking into [facility] (comment)  walking, possibly looking into [employer]     Frequency Add 2 additional days to program exercise sessions. Add 2 additional days to program exercise sessions. Add 2 additional days to program exercise sessions.     Initial Home Exercises Provided 03/18/23 03/18/23 03/18/23       Oxygen   Maintain Oxygen Saturation 88% or higher 88% or higher 88% or higher              Exercise Comments:   Exercise Goals and Review:   Exercise Goals Re-Evaluation :  Exercise Goals Re-Evaluation     Row Name 02/11/23 1436 02/25/23 1508 03/12/23 0831 03/18/23 1624 03/26/23 0818     Exercise Goal Re-Evaluation   Exercise Goals Review Increase Physical Activity;Increase Strength and Stamina;Understanding of Exercise Prescription Increase Physical Activity;Increase Strength and Stamina;Understanding of Exercise Prescription Increase Physical Activity;Increase Strength and Stamina;Understanding of Exercise Prescription Increase Physical Activity;Increase Strength and Stamina;Understanding of Exercise Prescription Increase Physical Activity;Increase Strength and Stamina;Understanding  of Exercise Prescription   Comments Patient is doing well for the first couple of times she has been at rehab. She was able to get 14 laps on the track and exercise the rest at her initial exercise prescription. She had appropriate RPEs throughout the sessions. We will continue to monitor as she progresses in the program. Melanie Ray is doing well in rehab. She walked up to 22 laps on the track, and also started using the treadmill and did well at a speed of 0.7 mph with no incline. She also was able to improve to level 2 on the T4 nustep and began using the XR at level 1. We will continue to monitor her progress in the program. Melanie Ray continues to do well in rehab. She walked up to 30 laps on the track this time around!  She has been consistent at level 1 on the T5 Nustep and could benefit from increasing to level 2. She has not been quite hitting her THR at this time. She has been walking at a 0.7 mph on the treadmill and seems to benefit more from the track as her speed is increased. Will continue to monitor. Reviewed home exercise with pt today.  Pt plans to walk for exercise. Patient has a treadmill at home. She may also walk outdoors or indoors if the weather is not appropriate. She was encouraged to practice walking on the TM here at rehab to ensure she has a good a balanced feel for it to make sure she is safe with it.  We also discussed the [facility] and she was willing to explore it as an option. She also was encouraged to use household items (like canned food) to help with resistance. Reviewed THR, pulse, RPE, sign and symptoms, pulse oximetery and when to call 911 or MD.  Also discussed weather considerations and indoor options.  Pt voiced understanding. Melanie Ray has only attended rehab once since the last review. During her one session she was able to work at level 1 on the XR and she walked the treadmill at a speed of 0.7 mph with no incline. She also has continued to use 2 lb hand weights for resistance  training. We will  continue to monitor her progress in the program.   Expected Outcomes Short: Continue to work at initial exercise prescription Long: Increase overall MET level and stamina Short: Continue to push for more laps on track, and progressively increase treadmill workload. Long: Continue to improve strength and stamina. Short: Slowly increase speed on treadmill, increase laps on track Long: Continue to increase overall MET level and stamina Short: Add on 1 day of exercise at home, check out the [facility] at her own convenience Long: Continue to exercise independently at home Short: Return to consistent attendance in rehab. Long: Continue to improve strength and stamina.    Row Name 04/09/23 1610 04/23/23 1503 05/10/23 0749 05/22/23 1443 06/06/23 1531     Exercise Goal Re-Evaluation   Exercise Goals Review Increase Physical Activity;Increase Strength and Stamina;Understanding of Exercise Prescription Increase Physical Activity;Increase Strength and Stamina;Understanding of Exercise Prescription Increase Physical Activity;Increase Strength and Stamina;Understanding of Exercise Prescription Increase Physical Activity;Increase Strength and Stamina;Understanding of Exercise Prescription Increase Physical Activity;Increase Strength and Stamina;Understanding of Exercise Prescription   Comments Melanie Ray is doing well in rehab.  Her attendance has been spotty at best averaging around once a week.  We have encouraged her to attend consistently to see more progress.  She is up to 30 laps and level 2 on the NuStep.  We will conitnue to monitor her progress. Melanie Ray is doing well in rehab. She recently increased her overall average MET level to 2.74 METs. She also walked up to 35 laps on the track after previously only walking 30 laps. She began using the arm crank at level 1, and continued ot work at level 1 on the XR as well. We will continue to monitor her progress. Melanie Ray is maintaining her previous workloads  with RPE at 13. Will encourage increasing levels if tolerated to help with  increasingstrength and stamina. Will continue to monitor progression. Melanie Ray has only attended one session since the last review. She was able to work at level 1 on the T4 nustep and walk 17 laps on the track. She also continues to use 2 lb hand weights for resistance training. We will continue to monitor her progress in the program. Melanie Ray is doing well in rehab. She recently increased her speed up to 1 mph with no incline. She also walked on the track and increased her laps back up to 35 laps. She also has continued to work at level 1 on the XR and T4 nustep. We will continue to monitor her progress in the program.   Expected Outcomes Short: Attend rehab regularly Long: Continue to improve stamina Short: Try level 2 on the XR. Long: Continue to improve strength and stamina. Short: Try level 2 on the XR or Nustep. Be sure walking at home outside weather permitting or inside. Long: Continue to improve strength and stamina. Short: Return to regular attendance, try level 2 on the T4 nustep. Long: Continue exercise to improve strength and stamina. Short: Try level 2 on the T4 nustep. Long: Continue exercise to improve strength and stamina.    Row Name 06/10/23 1610 06/19/23 1326 07/02/23 1314 07/17/23 1015       Exercise Goal Re-Evaluation   Exercise Goals Review Increase Physical Activity;Increase Strength and Stamina;Understanding of Exercise Prescription Increase Physical Activity;Increase Strength and Stamina;Understanding of Exercise Prescription Increase Physical Activity;Increase Strength and Stamina;Understanding of Exercise Prescription Increase Physical Activity;Increase Strength and Stamina;Understanding of Exercise Prescription    Comments Melanie Ray reports that she has not been exercising at home recently, but does  have a treadmill at home that she can use. She plans to start by adding in one day of walking at home and working  her way up to 3 days at home. She is also getting close to time for her post and will look to improve on it. We will continue to monitor her progress in the program. Melanie Ray returned to rehab for two sessions. She continues to walk up to 35 laps on the track and work at level 1 on the XR. She is also getting close to time for her post and will look to improve on it. We will continue to monitor her progress in the program. Melanie Ray attended two sessions since the last review. She continues to walk on the treadmill with her speed at 0.8 mph and no incline. She also is working a level 1 on the recumbent bike. She is also due for her post and will look to improve on it. We will continue to monitor her progress in the program. Melanie Ray continues to do well in rehab. She recently completed her post-6MWT and improved by 29%. She also increased her track laps from 24 to 30. We will continue to monitor her progress in the program.    Expected Outcomes Short: Improve on post . Long: Continue exercise to improve strength and stamina. Short: Improve on post . Long: Continue exercise to improve strength and stamina. Short: Improve on post . Long: Continue exercise to improve strength and stamina. Short: Graduate. Long: Continue to exercise independently.             Discharge Exercise Prescription (Final Exercise Prescription Changes):  Exercise Prescription Changes - 07/17/23 1000       Response to Exercise   Blood Pressure (Admit) 128/62    Blood Pressure (Exit) 122/60    Heart Rate (Admit) 96 bpm    Heart Rate (Exercise) 104 bpm    Heart Rate (Exit) 87 bpm    Oxygen Saturation (Admit) 96 %    Oxygen Saturation (Exercise) 96 %    Oxygen Saturation (Exit) 98 %    Rating of Perceived Exertion (Exercise) 15    Symptoms none    Duration Continue with 30 min of aerobic exercise without signs/symptoms of physical distress.    Intensity THRR unchanged      Progression   Progression  Continue to progress workloads to maintain intensity without signs/symptoms of physical distress.    Average METs 2.1      Resistance Training   Training Prescription Yes    Weight 2 lb    Reps 10-15      Interval Training   Interval Training No      REL-XR   Level 1    Minutes 15      Biostep-RELP   Level 1    Minutes 15    METs 2      Track   Laps 30    Minutes 15    METs 2.63      Home Exercise Plan   Plans to continue exercise at Home (comment)   walking, possibly looking into [employer]   Frequency Add 2 additional days to program exercise sessions.    Initial Home Exercises Provided 03/18/23      Oxygen   Maintain Oxygen Saturation 88% or higher             Nutrition:  Target Goals: Understanding of nutrition guidelines, daily intake of sodium 1500mg , cholesterol 200mg , calories 30% from fat  and 7% or less from saturated fats, daily to have 5 or more servings of fruits and vegetables.  Education: All About Nutrition: -Group instruction provided by verbal, written material, interactive activities, discussions, models, and posters to present general guidelines for heart healthy nutrition including fat, fiber, MyPlate, the role of sodium in heart healthy nutrition, utilization of the nutrition label, and utilization of this knowledge for meal planning. Follow up email sent as well. Written material given at graduation.   Biometrics:   Post Biometrics - 07/15/23 1608        Post  Biometrics   Height 5\' 3"  (1.6 m)    Weight 136 lb (61.7 kg)    BMI (Calculated) 24.1    Single Leg Stand 9.22 seconds             Nutrition Therapy Plan and Nutrition Goals:   Nutrition Assessments:  MEDIFICTS Score Key: >=70 Need to make dietary changes  40-70 Heart Healthy Diet <= 40 Therapeutic Level Cholesterol Diet  Flowsheet Row Cardiac Rehab from 02/05/2023 in [facility] Cardiac [facility] Re[facility]  Picture Your Plate Total Score on Admission 51      Picture  Your Plate Scores: <40 Unhealthy dietary pattern with much room for improvement. 41-50 Dietary pattern unlikely to meet recommendations for good health and room for improvement. 51-60 More healthful dietary pattern, with some room for improvement.  >60 Healthy dietary pattern, although there may be some specific behaviors that could be improved.    Nutrition Goals Re-Evaluation:  Nutrition Goals Re-Evaluation     Row Name 03/18/23 1617 03/27/23 1552 04/08/23 1613 06/10/23 1632       Goals   Current Weight -- -- 136 lb (61.7 kg) --    Nutrition Goal ST: compare 3-4 days of eating with list of foods high in vitamin K to see how much you are eating, read/compare food labels, review paperwork LT: manage INR, maintain A1C <7, limit sodium <2g/day, follow MyPlate guidelines -- Eat more --    Comment Melanie Ray states she has not really read or paid attention to her Vitamin K intake. Encouraged to start keeping track of the foods she eats (per RD goals take 3-4 days worth of eating) and advised her to discuss with her MD as our RD is not in her role anymore to review how much she should be having. Staff to review education with patient as well  so patient has a better understanding on importance. Provide a handout. She is reading labels for her sodium intake and staying mindful on what she is eating and how much she is eating Per encounter dated from 4/9- patient is not taking warfarin anymore and therefore does not need to monitor Vitamin K levels.  RN reviewed education and information with patient and confirmed medication list. New goals can be established by new RD for further nutrition goals if needed. Nothings is tasting good for her anymore and she has had a poor appetite since she is taking care of her husband. She is not intrested in meeting with the dietitian. Lakaila reports that she continues to have a poor appetite. She did state that she will work on eliminating sodium from her diet and will check  food labels. She has deferred to meet with the RD at this time.    Expected Outcome Short: Understand Vitamin K education involved with her diet, staff to review more information. Patient to start keeping log of food for next 3-4 days per RD goal Long: Continue  to follow guidelines by RD and eat a heart healthy diet Short: Establish new goals by new RD if needed Long: Continue to eat heart healthy diet Short: try to eat more when able. Long: maintain a diet that adheres to her. Short: Continue to try to eat more when able. Long: Practice Heart-healthy eating patterns.             Nutrition Goals Discharge (Final Nutrition Goals Re-Evaluation):  Nutrition Goals Re-Evaluation - 06/10/23 1632       Goals   Comment Melanie Ray reports that she continues to have a poor appetite. She did state that she will work on eliminating sodium from her diet and will check food labels. She has deferred to meet with the RD at this time.    Expected Outcome Short: Continue to try to eat more when able. Long: Practice Heart-healthy eating patterns.             Psychosocial: Target Goals: Acknowledge presence or absence of significant depression and/or stress, maximize coping skills, provide positive support system. Participant is able to verbalize types and ability to use techniques and skills needed for reducing stress and depression.   Education: Stress, Anxiety, and Depression - Group verbal and visual presentation to define topics covered.  Reviews how body is impacted by stress, anxiety, and depression.  Also discusses healthy ways to reduce stress and to treat/manage anxiety and depression.  Written material given at graduation.   Education: Sleep Hygiene -Provides group verbal and written instruction about how sleep can affect your health.  Define sleep hygiene, discuss sleep cycles and impact of sleep habits. Review good sleep hygiene tips.    Initial Review & Psychosocial Screening:   Quality of  Life Scores:   Scores of 19 and below usually indicate a poorer quality of life in these areas.  A difference of  2-3 points is a clinically meaningful difference.  A difference of 2-3 points in the total score of the Quality of Life Index has been associated with significant improvement in overall quality of life, self-image, physical symptoms, and general health in studies assessing change in quality of life.  PHQ-9: Review Flowsheet  More data exists      05/30/2023 05/13/2023 04/22/2023 04/08/2023 03/20/2023  Depression screen PHQ 2/9  Decreased Interest 2 2 2 3 1   Down, Depressed, Hopeless 2 2 2 3 1   PHQ - 2 Score 4 4 4 6 2   Altered sleeping - 3 1 1 1   Tired, decreased energy 3 2 2 1 2   Change in appetite 1 2 1 1  0  Feeling bad or failure about yourself  2 2 2  0 1  Trouble concentrating 1 1 0 0 0  Moving slowly or fidgety/restless 0 1 0 0 1  Suicidal thoughts 0 3 3 1 1   PHQ-9 Score - 18 13 10 8   Difficult doing work/chores - Somewhat difficult Somewhat difficult Somewhat difficult Somewhat difficult    Details           Interpretation of Total Score  Total Score Depression Severity:  1-4 = Minimal depression, 5-9 = Mild depression, 10-14 = Moderate depression, 15-19 = Moderately severe depression, 20-27 = Severe depression   Psychosocial Evaluation and Intervention:   Psychosocial Re-Evaluation:  Psychosocial Re-Evaluation     Row Name 03/18/23 1621 04/08/23 1610 06/10/23 1629         Psychosocial Re-Evaluation   Current issues with Current Depression;Current Psychotropic Meds Current Depression;Current Psychotropic Meds Current Depression;Current Psychotropic  Meds     Comments Melanie Ray states she is doing well mentally. She takes Trazadone before her sleep which helps her. She just came back from a trip from [place]- her sister just got doctor in education. She is taking all of her medications appropriately and states she has no extra stress, anxiety, or depression symptoms  at this time. She is enjoying coming to rehab. Encouraged her to reach out should anything change. Reviewed patient health questionnaire (PHQ-9) with patient for follow up. Previously, patients score indicated signs/symptoms of depression.  Reviewed to see if patient is improving symptom wise while in program.  Score declined and patient states that it is because she takes care of her husband who is in a facility and her dog requires alot of attention. She has no plans to hurt herself at this time. She states its stems more from the stress of life and that it would not be there if she were to pass. Melanie Ray reports that she continues to deal with some stress and anxiety related to being a caregiver for her husband. However, she does report that she is managing her stress better and relieves stress by reading and going to lunch with friends. Exercise has also been a good stress reliever for her.     Expected Outcomes Short: Continue coming to rehab for mood boost Long: Continue to maintain positive attitude and utilize exercise for stress management Short: Continue to work toward an improvement in PHQ9 scores by attending [facility] regularly. Long: Continue to improve stress and depression coping skills by talking with staff and attending [facility] regularly and work toward a positive mental state. Short: Continue to exercise for mental boost. Long: Continue to maintain positive outlook.     Interventions Encouraged to attend Cardiac Rehabilitation for the exercise Encouraged to attend Cardiac Rehabilitation for the exercise Encouraged to attend Cardiac Rehabilitation for the exercise     Continue Psychosocial Services  Follow up required by staff Follow up required by staff Follow up required by staff              Psychosocial Discharge (Final Psychosocial Re-Evaluation):  Psychosocial Re-Evaluation - 06/10/23 1629       Psychosocial Re-Evaluation   Current issues with Current  Depression;Current Psychotropic Meds    Comments Melanie Ray reports that she continues to deal with some stress and anxiety related to being a caregiver for her husband. However, she does report that she is managing her stress better and relieves stress by reading and going to lunch with friends. Exercise has also been a good stress reliever for her.    Expected Outcomes Short: Continue to exercise for mental boost. Long: Continue to maintain positive outlook.    Interventions Encouraged to attend Cardiac Rehabilitation for the exercise    Continue Psychosocial Services  Follow up required by staff             Vocational Rehabilitation: Provide vocational rehab assistance to qualifying candidates.   Vocational Rehab Evaluation & Intervention:   Education: Education Goals: Education classes will be provided on a variety of topics geared toward better understanding of heart health and risk factor modification. Participant will state understanding/return demonstration of topics presented as noted by education test scores.  Learning Barriers/Preferences:   General Cardiac Education Topics:  AED/CPR: - Group verbal and written instruction with the use of models to demonstrate the basic use of the AED with the basic ABC's of resuscitation.   Anatomy and Cardiac Procedures: - Group verbal and  visual presentation and models provide information about basic cardiac anatomy and function. Reviews the testing methods done to diagnose heart disease and the outcomes of the test results. Describes the treatment choices: Medical Management, Angioplasty, or Coronary Bypass Surgery for treating various heart conditions including Myocardial Infarction, Angina, Valve Disease, and Cardiac Arrhythmias.  Written material given at graduation.   Medication Safety: - Group verbal and visual instruction to review commonly prescribed medications for heart and lung disease. Reviews the medication, class of the  drug, and side effects. Includes the steps to properly store meds and maintain the prescription regimen.  Written material given at graduation.   Intimacy: - Group verbal instruction through game format to discuss how heart and lung disease can affect sexual intimacy. Written material given at graduation..   Know Your Numbers and Heart Failure: - Group verbal and visual instruction to discuss disease risk factors for cardiac and pulmonary disease and treatment options.  Reviews associated critical values for Overweight/Obesity, Hypertension, Cholesterol, and Diabetes.  Discusses basics of heart failure: signs/symptoms and treatments.  Introduces Heart Failure Zone chart for action plan for heart failure.  Written material given at graduation.   Infection Prevention: - Provides verbal and written material to individual with discussion of infection control including proper hand washing and proper equipment cleaning during exercise session. Flowsheet Row Cardiac Rehab from 01/28/2023 in [facility] Cardiac and Pulmonary Rehab  Date 01/28/23  Educator [facility]  Instruction Review Code 1- Verbalizes Understanding       Falls Prevention: - Provides verbal and written material to individual with discussion of falls prevention and safety. Flowsheet Row Cardiac Rehab from 01/28/2023 in [facility] Cardiac [facility] Pulmonary Rehab  Date 01/28/23  Educator [facility]  Instruction Review Code 1- Verbalizes Understanding       Other: -Provides group and verbal instruction on various topics (see comments)   Knowledge Questionnaire Score:   Core Components/Risk Factors/Patient Goals at Admission:   Education:Diabetes - Individual verbal and written instruction to review signs/symptoms of diabetes, desired ranges of glucose level fasting, after meals and with exercise. Acknowledge that pre and post exercise glucose checks will be done for 3 sessions at entry of program. Flowsheet Row Cardiac Rehab from 01/28/2023 in [facility]  Cardiac and Pulmonary Rehab  Date 01/28/23  Educator [facility]  Instruction Review Code 1- Verbalizes Understanding       Core Components/Risk Factors/Patient Goals Review:   Goals and Risk Factor Review     Row Name 03/18/23 1619 04/08/23 1619 06/10/23 1633         Core Components/Risk Factors/Patient Goals Review   Personal Goals Review Weight Management/Obesity;Heart Failure;Diabetes;Hypertension Hypertension Hypertension     Review Melanie Ray states she is maintaining her weight between 130-135 lb. She knows to look for any potential fluid gain with a sudden weight gain. Denies any heart failure symptoms at this time. Does not have a BP cuff but encouraged to get one so she can monitor her BP at home. She is diet controlled for her diabetes and sees PCP this Wednesday, 5/15. and may get A1C checked then. Melanie Ray blood pressure has been doing well. She does not check her blood pressure at home and informed her to get one if she was able to. Explained why it is important to take blood pressure at home while taking medicaions. Melanie Ray has not been checking her BP at home and does not own a BP cuff. She states that she will look into seeing if insurance will cover her getting a BP  cuff at home. Her pressures have stayed within normal ranges while at cardiac rehab.     Expected Outcomes Short: See PCP on Wednesday. buy BP cuff for at home monitoring Long: Continue to manage lifestyle risk factors Short: obtain a blood pressure cuff. Long: maintain blood pressure readings at home. Short: Obtain a blood pressure cuff. Long: Maintain blood pressure readings at home.              Core Components/Risk Factors/Patient Goals at Discharge (Final Review):   Goals and Risk Factor Review - 06/10/23 1633       Core Components/Risk Factors/Patient Goals Review   Personal Goals Review Hypertension    Review Melanie Ray has not been checking her BP at home and does not own a BP cuff. She states that she will look  into seeing if insurance will cover her getting a BP cuff at home. Her pressures have stayed within normal ranges while at cardiac rehab.    Expected Outcomes Short: Obtain a blood pressure cuff. Long: Maintain blood pressure readings at home.             ITP Comments:  ITP Comments     Row Name 02/20/23 1428 03/20/23 1204 03/27/23 1904 04/17/23 1140 05/14/23 1513   ITP Comments 30 day review completed. ITP sent to Dr. Bethann Punches, Medical Director of Cardiac Rehab. Continue with ITP unless changes are made by physician. 30 Day review completed. Medical Director ITP review done, changes made as directed, and signed approval by Medical Director. Per encounter dated from 4/9- patient is not taking warfarin anymore.  RN reviewed education with patient and confirmed medication list. New goals can be established by new RD for further nutrition goals if needed. 30 Day review completed. Medical Director ITP review done, changes made as directed, and signed approval by Medical Director. 30 Day review completed. Medical Director ITP review done, changes made as directed, and signed approval by Medical Director.    Row Name 06/11/23 1139 06/12/23 1210 07/10/23 1154 08/07/23 1248 08/07/23 1552   ITP Comments Sporadic attendance, able to review goals. 30 Day review completed. Medical Director ITP review done, changes made as directed, and signed approval by Medical Director. 30 Day review completed. Medical Director ITP review done, changes made as directed, and signed approval by Medical Director. 30 Day review completed. Medical Director ITP review done, changes made as directed, and signed approval by Medical Director. Melanie Ray graduated today from  rehab with 36 sessions completed.  Details of the patient's exercise prescription and what She needs to do in order to continue the prescription and progress were discussed with patient.  Patient was given a copy of prescription and goals.  Patient verbalized  understanding. Melanie Ray plans to continue to exercise by walking.            Comments: Discharge ITP

## 2023-08-07 NOTE — Progress Notes (Signed)
Daily Session Note  Patient Details  Name: Melanie Ray MRN: [id number] Date of Birth: July 07, 1951 Referring Provider:   Flowsheet Row Cardiac Rehab from 01/28/2023 in [facility] Cardiac and Pulmonary Rehab  Referring Provider Excell Seltzer       Encounter Date: 08/07/2023  Check In:  Session Check In - 08/07/23 1551       Check-In   Supervising physician immediately available to respond to emergencies See telemetry face sheet for immediately available ER MD    Location [facility]-Cardiac & Pulmonary Rehab    Staff Present Maxon Suzzette Righter, , Exercise Physiologist;Amardeep Beckers Jewel Baize, RN BSN;Joseph Reino [place], [place]    Virtual Visit No    Medication changes reported     No    Fall or balance concerns reported    No    Warm-up and Cool-down Performed on first and last piece of equipment    Resistance Training Performed Yes    VAD Patient? No    PAD/SET Patient? No      Pain Assessment   Currently in Pain? No/denies                Social History   Tobacco Use  Smoking Status Former   Current packs/day: 0.00   Average packs/day: 0.5 packs/day for 25.0 years (12.5 ttl pk-yrs)   Types: Cigarettes   Start date: 04/13/1972   Quit date: 04/13/1997   Years since quitting: 26.3  Smokeless Tobacco Never    Goals Met:  Independence with exercise equipment Exercise tolerated well No report of concerns or symptoms today Strength training completed today  Goals Unmet:  Not Applicable  Comments:  Melanie Ray graduated today from  rehab with 36 sessions completed.  Details of the patient's exercise prescription and what She needs to do in order to continue the prescription and progress were discussed with patient.  Patient was given a copy of prescription and goals.  Patient verbalized understanding. Melanie Ray plans to continue to exercise by walking.    Dr. Bethann Punches is Medical Director for [facility] Cardiac Rehabilitation.  Dr. Vida Rigger is Medical Director for [facility] Pulmonary  Rehabilitation.

## 2023-08-07 NOTE — Progress Notes (Signed)
Cardiac Individual Treatment Plan  Patient Details  Name: Melanie Ray MRN: [id number] Date of Birth: Nov 27, 1950 Referring Provider:   Flowsheet Row Cardiac Rehab from 01/28/2023 in [facility] Cardiac and Pulmonary Rehab  Referring Provider Excell Seltzer       Initial Encounter Date:  Flowsheet Row Cardiac Rehab from 01/28/2023 in [facility] Cardiac and Pulmonary Rehab  Date 01/28/23       Visit Diagnosis: S/P mitral valve replacement  Patient's Home Medications on Admission:  Current Outpatient Medications:    aspirin EC 81 MG tablet, Take 1 tablet (81 mg total) by mouth daily. Swallow whole., Disp: 30 tablet, Rfl: 12   atorvastatin (LIPITOR) 80 MG tablet, Take 1 tablet (80 mg total) by mouth daily., Disp: 90 tablet, Rfl: 3   carvedilol (COREG) 12.5 MG tablet, Take 0.5 tablets (6.25 mg total) by mouth 2 (two) times daily with a meal., Disp: 60 tablet, Rfl: 1   dorzolamide-timolol (COSOPT) 22.3-6.8 MG/ML ophthalmic solution, Place 1 drop into both eyes 2 (two) times daily. , Disp: , Rfl:    furosemide (LASIX) 40 MG tablet, Take 40 mg by mouth as needed. (Patient not taking: Reported on 05/24/2023), Disp: , Rfl:    gabapentin (NEURONTIN) 400 MG capsule, Take 1 capsule (400 mg total) by mouth 2 (two) times daily., Disp: 60 capsule, Rfl: 3   isosorbide mononitrate (IMDUR) 30 MG 24 hr tablet, Take 30 mg by mouth daily., Disp: , Rfl:    latanoprost (XALATAN) 0.005 % ophthalmic solution, Place 1 drop into both eyes at bedtime., Disp: , Rfl:    memantine (NAMENDA) 5 MG tablet, Take 5 mg by mouth daily. (Patient not taking: Reported on 05/31/2023), Disp: , Rfl:    mirtazapine (REMERON) 7.5 MG tablet, Take 1 tablet (7.5 mg total) by mouth at bedtime., Disp: 30 tablet, Rfl: 2   QUEtiapine (SEROQUEL) 50 MG tablet, Take 1-3 tabs nightly for sleep., Disp: , Rfl:    spironolactone (ALDACTONE) 25 MG tablet, Take 0.5 tablets (12.5 mg total) by mouth daily. (Patient not taking: Reported on 05/24/2023), Disp: 45  tablet, Rfl: 3   traZODone (DESYREL) 150 MG tablet, Take by mouth at bedtime as needed for sleep., Disp: , Rfl:    valsartan (DIOVAN) 80 MG tablet, Take 1 tablet by mouth twice daily, Disp: 180 tablet, Rfl: 0  Past Medical History: Past Medical History:  Diagnosis Date   ABLA (acute blood loss anemia) 10/24/2022   AICD (automatic cardioverter/defibrillator) present    Allergic rhinitis 09/25/2022   Back pain    Benign neoplasm of colon 04/27/2008   CAD (coronary artery disease) 04/11/2016   S/p ant STEMI 5/17: LHC >> LAD proximal 80%, mid 80%, distal 50%, ostial D1 60%; LCx with LPDA lesion 30%; RCA Mild calcification with no significant stenosis in a medium caliber, nondominant RCA; LVEF is estimated at 45% with inferoapical and lateral wall akinesis >> PCI: PCI: 3.5 x 24 mm Promus DES to prox LAD, 2.5 x 12 mm Promus DES to mid LAD.   Cerebellar stroke 03/30/2020   2021 MRI - Small, old left cerebellar infarct    Chest pain    Chronic HFrEF (heart failure with reduced ejection fraction) 10/25/2022   Chronic systolic CHF (congestive heart failure) 03/21/2016   Echo 01/30/17: Diff HK, mild focal basal septal hypertrophy, EF 30-35, mild AI, MAC, mild MR // Echo 06/08/16: Mild focal basal septal hypertrophy, EF 25-30%, diff HK, ant-septal AK, Gr 1 DD, mild AI, MAC, mild MR, PASP 37 mmHg // Echo  03/18/16: EF 30-35%, ant-septal AK, Gr 1 DD, mild MR, severe LAE.     Constipation    Diarrhea 01/02/2023   Dizziness    Dyspnea    Endotracheal tube present 10/24/2022   Essential hypertension 04/27/2008   Well-controlled today.  She will continue valsartan 80 mg twice a day, Imdur 30 mg daily, Lasix 20 mg every other day, [name] 10 mg daily, and carvedilol 12.5 mg twice daily.   Finger fracture 08/15/2021   She reports this is well-healing.  She will continue to see orthopedics.   Generalized anxiety disorder 04/27/2008   Glaucoma    Gout    History of acute anterior wall MI 03/17/2016   History  of colonoscopy 04/27/2008   History of COVID-19 06/27/2022   HLD (hyperlipidemia) 07/28/2015   Check lipid panel.  She will continue Lipitor.   Hot flash, menopausal 02/07/2018   Will check with our clinical pharmacist regarding the black cohosh and primrose in this patient.   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   Iron deficiency anemia, unspecified 04/27/2008   Ischemic cardiomyopathy 10/02/2016   Joint pain    Left bundle branch block 12/03/2018   Chronic.  She will follow with cardiology.   Major depressive disorder 07/28/2015   Chronic ongoing issue.  Has worsened somewhat recently.  Notes passive SI.  Advised if she develops intent or plan to harm herself she needs to go to the emergency room.  I advised her to contact her psychiatrist to arrange for follow-up and she was willing to do so.  She does have protective factors in place including her husband, dog, and nephew.   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  She will follow with cardiology.  We will send her EKG to her psychiatrist and have CMA contact the psychiatry office to inform them that the EKG was faxed and of the results.   Rash 02/07/2018   Possibly related to folliculitis or some other undetermined cause of her rash.  She will trial over-the-counter antibiotic ointment and if not beneficial she will let us know and we can refer her to dermatology.   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 Excell Seltzer and Dr. 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  She will continue [name] 10 mg daily.   Uterine leiomyoma 04/27/2008   Vitamin D deficiency 09/15/2010    Tobacco Use: Social History   Tobacco Use  Smoking Status Former   Current packs/day: 0.00   Average packs/day: 0.5 packs/day for 25.0 years (12.5 ttl pk-yrs)   Types: Cigarettes   Start date: 04/13/1972   Quit date: 04/13/1997   Years since quitting: 26.3  Smokeless Tobacco Never    Labs: Review Flowsheet  More data exists      Latest Ref Rng & Units 10/15/2022 10/24/2022 10/25/2022 10/26/2022 03/20/2023  Labs for  [facility] Cardiac and Pulmonary Rehab  Cholestrol 0 - 200 mg/dL - - - - 161   LDL (calc) 0 - 99 mg/dL - - - - 75   HDL-C >09.60 mg/dL - - - - 45.40   Trlycerides 0.0 - 149.0 mg/dL - - - - 98.1   Hemoglobin A1c 4.6 - 6.5 % - - - - 5.9   PH, Arterial 7.35 - 7.45 - 7.316  7.349  7.348  7.380  7.437  7.379  7.263  - - -  PCO2 arterial 32 - 48 mmHg - 40.6  38.3  35.2  35.7  32.0  34.0  42.2  - - -  Bicarbonate 20.0 - 28.0 mmol/L 25.4  25.8  20.7  21.1  19.3  21.4  21.6  27.8  20.0  19.1  - - -  TCO2 22 - 32 mmol/L 27  27  22  22  20  23  23  24   49  29  21  20  20  23   - - -  Acid-base deficit 0.0 - 2.0 mmol/L - 5.0  4.0  6.0  4.0  2.0  5.0  7.0  - - -  O2 Saturation % 64  64  98  79  98  98  93  100  81  100  100  70.1  78.2  -    Details       Multiple values from one day are sorted in reverse-chronological order          Exercise Target Goals: Exercise Program Goal: Individual exercise prescription set using results from initial 6 min walk test and THRR while considering  patient's activity barriers and safety.   Exercise Prescription Goal: Initial exercise prescription builds to 30-45 minutes a day of aerobic activity, 2-3 days per week.  Home  exercise guidelines will be given to patient during program as part of exercise prescription that the participant will acknowledge.   Education: Aerobic Exercise: - Group verbal and visual presentation on the components of exercise prescription. Introduces F.I.T.T principle from ACSM for exercise prescriptions.  Reviews F.I.T.T. principles of aerobic exercise including progression. Written material given at graduation.   Education: Resistance Exercise: - Group verbal and visual presentation on the components of exercise prescription. Introduces F.I.T.T principle from ACSM for exercise prescriptions  Reviews F.I.T.T. principles of resistance exercise including progression. Written material given at graduation.    Education: Exercise & Equipment Safety: - Individual verbal instruction and demonstration of equipment use and safety with use of the equipment. Flowsheet Row Cardiac Rehab from 01/28/2023 in [facility] Cardiac and Pulmonary Rehab  Date 01/28/23  Educator [facility]  Instruction Review Code 1- Verbalizes Understanding       Education: Exercise Physiology & General Exercise Guidelines: - Group verbal and written instruction with models to review the exercise physiology of the cardiovascular system and associated critical values. Provides general exercise guidelines with specific guidelines to those with heart or lung disease.    Education: Flexibility, Balance, Mind/Body Relaxation: - Group verbal and visual presentation with interactive activity on the components of exercise prescription. Introduces F.I.T.T principle from ACSM for exercise prescriptions. Reviews F.I.T.T. principles of flexibility and balance exercise training including progression. Also discusses the mind body connection.  Reviews various relaxation techniques to help reduce and manage stress (i.e. Deep breathing, progressive muscle relaxation, and visualization). Balance handout provided to take home. Written material given at  graduation.   Activity Barriers & Risk Stratification:   6  Minute Walk:  6 Minute Walk     Row Name 07/15/23 1551         6 Minute Walk   Phase Discharge     Distance 1100 feet     Distance % Change 29 %     Distance Feet Change 280 ft     Walk Time 6 minutes     # of Rest Breaks 0     MPH 2.08     METS 2.69     RPE 13     Perceived Dyspnea  1     VO2 Peak 9.42     Symptoms No     Resting HR 85 bpm     Resting BP 122/60     Resting Oxygen Saturation  96 %     Exercise Oxygen Saturation  during 6 min walk 98 %     Max Ex. HR 103 bpm     Max Ex. BP 148/60              Oxygen Initial Assessment:   Oxygen Re-Evaluation:   Oxygen Discharge (Final Oxygen Re-Evaluation):   Initial Exercise Prescription:   Perform Capillary Blood Glucose checks as needed.  Exercise Prescription Changes:   Exercise Prescription Changes     Row Name 02/11/23 1400 02/25/23 1500 03/12/23 0800 03/18/23 1600 03/26/23 0800     Response to Exercise   Blood Pressure (Admit) 132/62 128/68 130/60 -- 128/54   Blood Pressure (Exercise) 136/60 142/62 138/60 -- --   Blood Pressure (Exit) 130/64 148/60 124/82 -- 128/60   Heart Rate (Admit) 71 bpm 71 bpm 68 bpm -- 82 bpm   Heart Rate (Exercise) 94 bpm 94 bpm 102 bpm -- 97 bpm   Heart Rate (Exit) 82 bpm 63 bpm 79 bpm -- 78 bpm   Rating of Perceived Exertion (Exercise) 13 13 13  -- 13   Symptoms none none none -- none   Comments 2nd full day of exercise -- -- -- --   Duration Progress to 30 minutes of  aerobic without signs/symptoms of physical distress Continue with 30 min of aerobic exercise without signs/symptoms of physical distress. Continue with 30 min of aerobic exercise without signs/symptoms of physical distress. -- Continue with 30 min of aerobic exercise without signs/symptoms of physical distress.   Intensity THRR unchanged THRR unchanged THRR unchanged -- THRR unchanged     Progression   Progression Continue to progress  workloads to maintain intensity without signs/symptoms of physical distress. Continue to progress workloads to maintain intensity without signs/symptoms of physical distress. Continue to progress workloads to maintain intensity without signs/symptoms of physical distress. -- Continue to progress workloads to maintain intensity without signs/symptoms of physical distress.   Average METs 1.72 1.95 2.11 -- 1.97     Resistance Training   Training Prescription Yes Yes Yes -- Yes   Weight 2 lb 2 lb 2 lb -- 2 lb   Reps 10-15 10-15 10-15 -- 10-15     Interval Training   Interval Training No No No -- No     Treadmill   MPH -- 0.7 0.7 -- 0.7   Grade -- 0 0 -- 0   Minutes -- 15 15 -- 15   METs -- 1.54 1.5 -- 1.54     NuStep   Level 1 2 2  -- --   Minutes 15 15 15  -- --   METs 1.9 2.5 2.6 -- --     REL-XR   Level -- 1 -- --  1   Minutes -- 15 -- -- 15   METs -- 1.6 -- -- 2.4     T5 Nustep   Level 1 -- 1 -- --   Minutes 15 -- 15 -- --   METs 1.8 -- 1.7 -- --     Track   Laps 14 22 30  -- --   Minutes 15 15 15  -- --   METs 1.76 2.2 2.63 -- --     Home Exercise Plan   Plans to continue exercise at -- -- -- Home (comment)  walking, possibly looking into [facility] (comment)  walking, possibly looking into [employer]   Frequency -- -- -- Add 2 additional days to program exercise sessions. Add 2 additional days to program exercise sessions.   Initial Home Exercises Provided -- -- -- 03/18/23 03/18/23     Oxygen   Maintain Oxygen Saturation 88% or higher 88% or higher 88% or higher 88% or higher 88% or higher    Row Name 04/09/23 0800 04/23/23 1500 05/10/23 0700 05/22/23 1400 06/06/23 1500     Response to Exercise   Blood Pressure (Admit) 102/60 114/66 130/70 124/58 114/58   Blood Pressure (Exercise) -- 142/72 140/70 144/66 140/70   Blood Pressure (Exit) 122/72 100/60 124/66 134/58 138/70   Heart Rate (Admit) 88 bpm 85 bpm 87 bpm 101 bpm 84 bpm   Heart Rate (Exercise) 104 bpm 100 bpm  94 bpm 100 bpm 107 bpm   Heart Rate (Exit) 86 bpm 80 bpm 80 bpm 94 bpm 85 bpm   Rating of Perceived Exertion (Exercise) 13 13 13 13 13    Symptoms none none none none none   Duration Continue with 30 min of aerobic exercise without signs/symptoms of physical distress. Continue with 30 min of aerobic exercise without signs/symptoms of physical distress. Continue with 30 min of aerobic exercise without signs/symptoms of physical distress. Continue with 30 min of aerobic exercise without signs/symptoms of physical distress. Continue with 30 min of aerobic exercise without signs/symptoms of physical distress.   Intensity THRR unchanged THRR unchanged THRR unchanged THRR unchanged THRR unchanged     Progression   Progression Continue to progress workloads to maintain intensity without signs/symptoms of physical distress. Continue to progress workloads to maintain intensity without signs/symptoms of physical distress. Continue to progress workloads to maintain intensity without signs/symptoms of physical distress. Continue to progress workloads to maintain intensity without signs/symptoms of physical distress. Continue to progress workloads to maintain intensity without signs/symptoms of physical distress.   Average METs 2.66 2.74 2.24 2.16 2.07     Resistance Training   Training Prescription Yes Yes Yes Yes Yes   Weight 2 lb 2 lb 2 lb 2 lb 2 lb   Reps 10-15 10-15 10-15 10-15 10-15     Interval Training   Interval Training No No No No No     Treadmill   MPH -- -- -- -- 1   Grade -- -- -- -- 0   Minutes -- -- -- -- 15   METs -- -- -- -- 1.77     NuStep   Level 2 -- 1 1 1    Minutes 15 -- 15 15 15    METs 2.7 -- 2.24 2.4 --     Arm Ergometer   Level -- 1 -- -- --   Minutes -- 15 -- -- --   METs -- 2.5 -- -- --     REL-XR   Level -- 1 1 -- 1  Minutes -- 15 15 -- 15   METs -- 3 -- -- 1.3     Track   Laps 30 35 30 17 35   Minutes 15 15 15 15 15    METs 2.63 2.9 2.13 1.92 2.9     Home  Exercise Plan   Plans to continue exercise at Home (comment)  walking, possibly looking into [facility] (comment)  walking, possibly looking into [facility] (comment)  walking, possibly looking into [facility] (comment)  walking, possibly looking into [facility] (comment)  walking, possibly looking into [employer]   Frequency Add 2 additional days to program exercise sessions. Add 2 additional days to program exercise sessions. Add 2 additional days to program exercise sessions. Add 2 additional days to program exercise sessions. Add 2 additional days to program exercise sessions.   Initial Home Exercises Provided 03/18/23 03/18/23 03/18/23 03/18/23 03/18/23     Oxygen   Maintain Oxygen Saturation 88% or higher 88% or higher 88% or higher 88% or higher 88% or higher    Row Name 06/19/23 1300 07/02/23 1300 07/17/23 1000         Response to Exercise   Blood Pressure (Admit) 142/70 146/60 128/62     Blood Pressure (Exit) 124/70 152/64 122/60     Heart Rate (Admit) 92 bpm 99 bpm 96 bpm     Heart Rate (Exercise) 102 bpm 110 bpm 104 bpm     Heart Rate (Exit) 94 bpm 95 bpm 87 bpm     Oxygen Saturation (Admit) -- -- 96 %     Oxygen Saturation (Exercise) -- -- 96 %     Oxygen Saturation (Exit) -- -- 98 %     Rating of Perceived Exertion (Exercise) 13 13 15      Symptoms none none none     Duration Continue with 30 min of aerobic exercise without signs/symptoms of physical distress. Continue with 30 min of aerobic exercise without signs/symptoms of physical distress. Continue with 30 min of aerobic exercise without signs/symptoms of physical distress.     Intensity THRR unchanged THRR unchanged THRR unchanged       Progression   Progression Continue to progress workloads to maintain intensity without signs/symptoms of physical distress. Continue to progress workloads to maintain intensity without signs/symptoms of physical distress. Continue to progress workloads to maintain intensity  without signs/symptoms of physical distress.     Average METs 2.25 1.97 2.1       Resistance Training   Training Prescription Yes Yes Yes     Weight 2 lb 2 lb 2 lb     Reps 10-15 10-15 10-15       Interval Training   Interval Training No No No       Treadmill   MPH 0.6 0.8 --     Grade 0 0 --     Minutes 15 10 --     METs 1.46 1.61 --       Recumbant Bike   Level -- 1 --     Watts -- 15 --     Minutes -- 15 --       REL-XR   Level 1 -- 1     Minutes 15 -- 15     METs 2.4 -- --       Biostep-RELP   Level -- -- 1     Minutes -- -- 15     METs -- -- 2       Track  Laps 35 24 30     Minutes 15 15 15      METs 2.9 2.31 2.63       Home Exercise Plan   Plans to continue exercise at Home (comment)  walking, possibly looking into [facility] (comment)  walking, possibly looking into [facility] (comment)  walking, possibly looking into [employer]     Frequency Add 2 additional days to program exercise sessions. Add 2 additional days to program exercise sessions. Add 2 additional days to program exercise sessions.     Initial Home Exercises Provided 03/18/23 03/18/23 03/18/23       Oxygen   Maintain Oxygen Saturation 88% or higher 88% or higher 88% or higher              Exercise Comments:   Exercise Goals and Review:   Exercise Goals Re-Evaluation :  Exercise Goals Re-Evaluation     Row Name 02/11/23 1436 02/25/23 1508 03/12/23 0831 03/18/23 1624 03/26/23 0818     Exercise Goal Re-Evaluation   Exercise Goals Review Increase Physical Activity;Increase Strength and Stamina;Understanding of Exercise Prescription Increase Physical Activity;Increase Strength and Stamina;Understanding of Exercise Prescription Increase Physical Activity;Increase Strength and Stamina;Understanding of Exercise Prescription Increase Physical Activity;Increase Strength and Stamina;Understanding of Exercise Prescription Increase Physical Activity;Increase Strength and Stamina;Understanding  of Exercise Prescription   Comments Patient is doing well for the first couple of times she has been at rehab. She was able to get 14 laps on the track and exercise the rest at her initial exercise prescription. She had appropriate RPEs throughout the sessions. We will continue to monitor as she progresses in the program. Melanie Ray is doing well in rehab. She walked up to 22 laps on the track, and also started using the treadmill and did well at a speed of 0.7 mph with no incline. She also was able to improve to level 2 on the T4 nustep and began using the XR at level 1. We will continue to monitor her progress in the program. Melanie Ray continues to do well in rehab. She walked up to 30 laps on the track this time around!  She has been consistent at level 1 on the T5 Nustep and could benefit from increasing to level 2. She has not been quite hitting her THR at this time. She has been walking at a 0.7 mph on the treadmill and seems to benefit more from the track as her speed is increased. Will continue to monitor. Reviewed home exercise with pt today.  Pt plans to walk for exercise. Patient has a treadmill at home. She may also walk outdoors or indoors if the weather is not appropriate. She was encouraged to practice walking on the TM here at rehab to ensure she has a good a balanced feel for it to make sure she is safe with it.  We also discussed the [facility] and she was willing to explore it as an option. She also was encouraged to use household items (like canned food) to help with resistance. Reviewed THR, pulse, RPE, sign and symptoms, pulse oximetery and when to call 911 or MD.  Also discussed weather considerations and indoor options.  Pt voiced understanding. Shelly has only attended rehab once since the last review. During her one session she was able to work at level 1 on the XR and she walked the treadmill at a speed of 0.7 mph with no incline. She also has continued to use 2 lb hand weights for resistance  training. We will  continue to monitor her progress in the program.   Expected Outcomes Short: Continue to work at initial exercise prescription Long: Increase overall MET level and stamina Short: Continue to push for more laps on track, and progressively increase treadmill workload. Long: Continue to improve strength and stamina. Short: Slowly increase speed on treadmill, increase laps on track Long: Continue to increase overall MET level and stamina Short: Add on 1 day of exercise at home, check out the [facility] at her own convenience Long: Continue to exercise independently at home Short: Return to consistent attendance in rehab. Long: Continue to improve strength and stamina.    Row Name 04/09/23 8295 04/23/23 1503 05/10/23 0749 05/22/23 1443 06/06/23 1531     Exercise Goal Re-Evaluation   Exercise Goals Review Increase Physical Activity;Increase Strength and Stamina;Understanding of Exercise Prescription Increase Physical Activity;Increase Strength and Stamina;Understanding of Exercise Prescription Increase Physical Activity;Increase Strength and Stamina;Understanding of Exercise Prescription Increase Physical Activity;Increase Strength and Stamina;Understanding of Exercise Prescription Increase Physical Activity;Increase Strength and Stamina;Understanding of Exercise Prescription   Comments Soon is doing well in rehab.  Her attendance has been spotty at best averaging around once a week.  We have encouraged her to attend consistently to see more progress.  She is up to 30 laps and level 2 on the NuStep.  We will conitnue to monitor her progress. Melanie Ray is doing well in rehab. She recently increased her overall average MET level to 2.74 METs. She also walked up to 35 laps on the track after previously only walking 30 laps. She began using the arm crank at level 1, and continued ot work at level 1 on the XR as well. We will continue to monitor her progress. Melanie Ray is maintaining her previous workloads  with RPE at 13. Will encourage increasing levels if tolerated to help with  increasingstrength and stamina. Will continue to monitor progression. Melanie Ray has only attended one session since the last review. She was able to work at level 1 on the T4 nustep and walk 17 laps on the track. She also continues to use 2 lb hand weights for resistance training. We will continue to monitor her progress in the program. Melanie Ray is doing well in rehab. She recently increased her speed up to 1 mph with no incline. She also walked on the track and increased her laps back up to 35 laps. She also has continued to work at level 1 on the XR and T4 nustep. We will continue to monitor her progress in the program.   Expected Outcomes Short: Attend rehab regularly Long: Continue to improve stamina Short: Try level 2 on the XR. Long: Continue to improve strength and stamina. Short: Try level 2 on the XR or Nustep. Be sure walking at home outside weather permitting or inside. Long: Continue to improve strength and stamina. Short: Return to regular attendance, try level 2 on the T4 nustep. Long: Continue exercise to improve strength and stamina. Short: Try level 2 on the T4 nustep. Long: Continue exercise to improve strength and stamina.    Row Name 06/10/23 1610 06/19/23 1326 07/02/23 1314 07/17/23 1015       Exercise Goal Re-Evaluation   Exercise Goals Review Increase Physical Activity;Increase Strength and Stamina;Understanding of Exercise Prescription Increase Physical Activity;Increase Strength and Stamina;Understanding of Exercise Prescription Increase Physical Activity;Increase Strength and Stamina;Understanding of Exercise Prescription Increase Physical Activity;Increase Strength and Stamina;Understanding of Exercise Prescription    Comments Melanie Ray reports that she has not been exercising at home recently, but does  have a treadmill at home that she can use. She plans to start by adding in one day of walking at home and working  her way up to 3 days at home. She is also getting close to time for her post and will look to improve on it. We will continue to monitor her progress in the program. Melanie Ray returned to rehab for two sessions. She continues to walk up to 35 laps on the track and work at level 1 on the XR. She is also getting close to time for her post and will look to improve on it. We will continue to monitor her progress in the program. Melanie Ray attended two sessions since the last review. She continues to walk on the treadmill with her speed at 0.8 mph and no incline. She also is working a level 1 on the recumbent bike. She is also due for her post and will look to improve on it. We will continue to monitor her progress in the program. Melanie Ray continues to do well in rehab. She recently completed her post-6MWT and improved by 29%. She also increased her track laps from 24 to 30. We will continue to monitor her progress in the program.    Expected Outcomes Short: Improve on post . Long: Continue exercise to improve strength and stamina. Short: Improve on post . Long: Continue exercise to improve strength and stamina. Short: Improve on post . Long: Continue exercise to improve strength and stamina. Short: Graduate. Long: Continue to exercise independently.             Discharge Exercise Prescription (Final Exercise Prescription Changes):  Exercise Prescription Changes - 07/17/23 1000       Response to Exercise   Blood Pressure (Admit) 128/62    Blood Pressure (Exit) 122/60    Heart Rate (Admit) 96 bpm    Heart Rate (Exercise) 104 bpm    Heart Rate (Exit) 87 bpm    Oxygen Saturation (Admit) 96 %    Oxygen Saturation (Exercise) 96 %    Oxygen Saturation (Exit) 98 %    Rating of Perceived Exertion (Exercise) 15    Symptoms none    Duration Continue with 30 min of aerobic exercise without signs/symptoms of physical distress.    Intensity THRR unchanged      Progression   Progression  Continue to progress workloads to maintain intensity without signs/symptoms of physical distress.    Average METs 2.1      Resistance Training   Training Prescription Yes    Weight 2 lb    Reps 10-15      Interval Training   Interval Training No      REL-XR   Level 1    Minutes 15      Biostep-RELP   Level 1    Minutes 15    METs 2      Track   Laps 30    Minutes 15    METs 2.63      Home Exercise Plan   Plans to continue exercise at Home (comment)   walking, possibly looking into [facility]   Frequency Add 2 additional days to program exercise sessions.    Initial Home Exercises Provided 03/18/23      Oxygen   Maintain Oxygen Saturation 88% or higher             Nutrition:  Target Goals: Understanding of nutrition guidelines, daily intake of sodium 1500mg , cholesterol 200mg , calories 30% from fat  and 7% or less from saturated fats, daily to have 5 or more servings of fruits and vegetables.  Education: All About Nutrition: -Group instruction provided by verbal, written material, interactive activities, discussions, models, and posters to present general guidelines for heart healthy nutrition including fat, fiber, MyPlate, the role of sodium in heart healthy nutrition, utilization of the nutrition label, and utilization of this knowledge for meal planning. Follow up email sent as well. Written material given at graduation.   Biometrics:   Post Biometrics - 07/15/23 1608        Post  Biometrics   Height 5\' 3"  (1.6 m)    Weight 136 lb (61.7 kg)    BMI (Calculated) 24.1    Single Leg Stand 9.22 seconds             Nutrition Therapy Plan and Nutrition Goals:   Nutrition Assessments:  MEDIFICTS Score Key: >=70 Need to make dietary changes  40-70 Heart Healthy Diet <= 40 Therapeutic Level Cholesterol Diet  Flowsheet Row Cardiac Rehab from 02/05/2023 in [facility]  Picture Your Plate Total Score on Admission 51      Picture  Your Plate Scores: <75 Unhealthy dietary pattern with much room for improvement. 41-50 Dietary pattern unlikely to meet recommendations for good health and room for improvement. 51-60 More healthful dietary pattern, with some room for improvement.  >60 Healthy dietary pattern, although there may be some specific behaviors that could be improved.    Nutrition Goals Re-Evaluation:  Nutrition Goals Re-Evaluation     Row Name 03/18/23 1617 03/27/23 1552 04/08/23 1613 06/10/23 1632       Goals   Current Weight -- -- 136 lb (61.7 kg) --    Nutrition Goal ST: compare 3-4 days of eating with list of foods high in vitamin K to see how much you are eating, read/compare food labels, review paperwork LT: manage INR, maintain A1C <7, limit sodium <2g/day, follow MyPlate guidelines -- Eat more --    Comment Melanie Ray states she has not really read or paid attention to her Vitamin K intake. Encouraged to start keeping track of the foods she eats (per RD goals take 3-4 days worth of eating) and advised her to discuss with her MD as our RD is not in her role anymore to review how much she should be having. Staff to review education with patient as well  so patient has a better understanding on importance. Provide a handout. She is reading labels for her sodium intake and staying mindful on what she is eating and how much she is eating Per encounter dated from 4/9- patient is not taking warfarin anymore and therefore does not need to monitor Vitamin K levels.  RN reviewed education and information with patient and confirmed medication list. New goals can be established by new RD for further nutrition goals if needed. Nothings is tasting good for her anymore and she has had a poor appetite since she is taking care of her husband. She is not intrested in meeting with the dietitian. Melanie Ray reports that she continues to have a poor appetite. She did state that she will work on eliminating sodium from her diet and will check  food labels. She has deferred to meet with the RD at this time.    Expected Outcome Short: Understand Vitamin K education involved with her diet, staff to review more information. Patient to start keeping log of food for next 3-4 days per RD goal Long: Continue  to follow guidelines by RD and eat a heart healthy diet Short: Establish new goals by new RD if needed Long: Continue to eat heart healthy diet Short: try to eat more when able. Long: maintain a diet that adheres to her. Short: Continue to try to eat more when able. Long: Practice Heart-healthy eating patterns.             Nutrition Goals Discharge (Final Nutrition Goals Re-Evaluation):  Nutrition Goals Re-Evaluation - 06/10/23 1632       Goals   Comment Melanie Ray reports that she continues to have a poor appetite. She did state that she will work on eliminating sodium from her diet and will check food labels. She has deferred to meet with the RD at this time.    Expected Outcome Short: Continue to try to eat more when able. Long: Practice Heart-healthy eating patterns.             Psychosocial: Target Goals: Acknowledge presence or absence of significant depression and/or stress, maximize coping skills, provide positive support system. Participant is able to verbalize types and ability to use techniques and skills needed for reducing stress and depression.   Education: Stress, Anxiety, and Depression - Group verbal and visual presentation to define topics covered.  Reviews how body is impacted by stress, anxiety, and depression.  Also discusses healthy ways to reduce stress and to treat/manage anxiety and depression.  Written material given at graduation.   Education: Sleep Hygiene -Provides group verbal and written instruction about how sleep can affect your health.  Define sleep hygiene, discuss sleep cycles and impact of sleep habits. Review good sleep hygiene tips.    Initial Review & Psychosocial Screening:   Quality of  Life Scores:   Scores of 19 and below usually indicate a poorer quality of life in these areas.  A difference of  2-3 points is a clinically meaningful difference.  A difference of 2-3 points in the total score of the Quality of Life Index has been associated with significant improvement in overall quality of life, self-image, physical symptoms, and general health in studies assessing change in quality of life.  PHQ-9: Review Flowsheet  More data exists      05/30/2023 05/13/2023 04/22/2023 04/08/2023 03/20/2023  Depression screen PHQ 2/9  Decreased Interest 2 2 2 3 1   Down, Depressed, Hopeless 2 2 2 3 1   PHQ - 2 Score 4 4 4 6 2   Altered sleeping - 3 1 1 1   Tired, decreased energy 3 2 2 1 2   Change in appetite 1 2 1 1  0  Feeling bad or failure about yourself  2 2 2  0 1  Trouble concentrating 1 1 0 0 0  Moving slowly or fidgety/restless 0 1 0 0 1  Suicidal thoughts 0 3 3 1 1   PHQ-9 Score - 18 13 10 8   Difficult doing work/chores - Somewhat difficult Somewhat difficult Somewhat difficult Somewhat difficult    Details           Interpretation of Total Score  Total Score Depression Severity:  1-4 = Minimal depression, 5-9 = Mild depression, 10-14 = Moderate depression, 15-19 = Moderately severe depression, 20-27 = Severe depression   Psychosocial Evaluation and Intervention:   Psychosocial Re-Evaluation:  Psychosocial Re-Evaluation     Row Name 03/18/23 1621 04/08/23 1610 06/10/23 1629         Psychosocial Re-Evaluation   Current issues with Current Depression;Current Psychotropic Meds Current Depression;Current Psychotropic Meds Current Depression;Current Psychotropic  Meds     Comments Melanie Ray states she is doing well mentally. She takes Trazadone before her sleep which helps her. She just came back from a trip from [place]- her sister just got doctor in education. She is taking all of her medications appropriately and states she has no extra stress, anxiety, or depression symptoms  at this time. She is enjoying coming to rehab. Encouraged her to reach out should anything change. Reviewed patient health questionnaire (PHQ-9) with patient for follow up. Previously, patients score indicated signs/symptoms of depression.  Reviewed to see if patient is improving symptom wise while in program.  Score declined and patient states that it is because she takes care of her husband who is in a facility and her dog requires alot of attention. She has no plans to hurt herself at this time. She states its stems more from the stress of life and that it would not be there if she were to pass. Melanie Ray reports that she continues to deal with some stress and anxiety related to being a caregiver for her husband. However, she does report that she is managing her stress better and relieves stress by reading and going to lunch with friends. Exercise has also been a good stress reliever for her.     Expected Outcomes Short: Continue coming to rehab for mood boost Long: Continue to maintain positive attitude and utilize exercise for stress management Short: Continue to work toward an improvement in PHQ9 scores by attending [facility] regularly. Long: Continue to improve stress and depression coping skills by talking with staff and attending [facility] regularly and work toward a positive mental state. Short: Continue to exercise for mental boost. Long: Continue to maintain positive outlook.     Interventions Encouraged to attend Cardiac Rehabilitation for the exercise Encouraged to attend Cardiac Rehabilitation for the exercise Encouraged to attend Cardiac Rehabilitation for the exercise     Continue Psychosocial Services  Follow up required by staff Follow up required by staff Follow up required by staff              Psychosocial Discharge (Final Psychosocial Re-Evaluation):  Psychosocial Re-Evaluation - 06/10/23 1629       Psychosocial Re-Evaluation   Current issues with Current  Depression;Current Psychotropic Meds    Comments Melanie Ray reports that she continues to deal with some stress and anxiety related to being a caregiver for her husband. However, she does report that she is managing her stress better and relieves stress by reading and going to lunch with friends. Exercise has also been a good stress reliever for her.    Expected Outcomes Short: Continue to exercise for mental boost. Long: Continue to maintain positive outlook.    Interventions Encouraged to attend Cardiac Rehabilitation for the exercise    Continue Psychosocial Services  Follow up required by staff             Vocational Rehabilitation: Provide vocational rehab assistance to qualifying candidates.   Vocational Rehab Evaluation & Intervention:   Education: Education Goals: Education classes will be provided on a variety of topics geared toward better understanding of heart health and risk factor modification. Participant will state understanding/return demonstration of topics presented as noted by education test scores.  Learning Barriers/Preferences:   General Cardiac Education Topics:  AED/CPR: - Group verbal and written instruction with the use of models to demonstrate the basic use of the AED with the basic ABC's of resuscitation.   Anatomy and Cardiac Procedures: - Group verbal and  visual presentation and models provide information about basic cardiac anatomy and function. Reviews the testing methods done to diagnose heart disease and the outcomes of the test results. Describes the treatment choices: Medical Management, Angioplasty, or Coronary Bypass Surgery for treating various heart conditions including Myocardial Infarction, Angina, Valve Disease, and Cardiac Arrhythmias.  Written material given at graduation.   Medication Safety: - Group verbal and visual instruction to review commonly prescribed medications for heart and lung disease. Reviews the medication, class of the  drug, and side effects. Includes the steps to properly store meds and maintain the prescription regimen.  Written material given at graduation.   Intimacy: - Group verbal instruction through game format to discuss how heart and lung disease can affect sexual intimacy. Written material given at graduation..   Know Your Numbers and Heart Failure: - Group verbal and visual instruction to discuss disease risk factors for cardiac and pulmonary disease and treatment options.  Reviews associated critical values for Overweight/Obesity, Hypertension, Cholesterol, and Diabetes.  Discusses basics of heart failure: signs/symptoms and treatments.  Introduces Heart Failure Zone chart for action plan for heart failure.  Written material given at graduation.   Infection Prevention: - Provides verbal and written material to individual with discussion of infection control including proper hand washing and proper equipment cleaning during exercise session. Flowsheet Row Cardiac Rehab from 01/28/2023 in [facility] Cardiac [facility] Pulmonary Rehab  Date 01/28/23  Educator [facility]  Instruction Review Code 1- Verbalizes Understanding       Falls Prevention: - Provides verbal and written material to individual with discussion of falls prevention and safety. Flowsheet Row Cardiac Rehab from 01/28/2023 in [facility] Cardiac and Pulmonary Rehab  Date 01/28/23  Educator [facility]  Instruction Review Code 1- Verbalizes Understanding       Other: -Provides group and verbal instruction on various topics (see comments)   Knowledge Questionnaire Score:   Core Components/Risk Factors/Patient Goals at Admission:   Education:Diabetes - Individual verbal and written instruction to review signs/symptoms of diabetes, desired ranges of glucose level fasting, after meals and with exercise. Acknowledge that pre and post exercise glucose checks will be done for 3 sessions at entry of program. Flowsheet Row Cardiac Rehab from 01/28/2023 in [facility]  Cardiac and Pulmonary Rehab  Date 01/28/23  Educator [facility]  Instruction Review Code 1- Verbalizes Understanding       Core Components/Risk Factors/Patient Goals Review:   Goals and Risk Factor Review     Row Name 03/18/23 1619 04/08/23 1619 06/10/23 1633         Core Components/Risk Factors/Patient Goals Review   Personal Goals Review Weight Management/Obesity;Heart Failure;Diabetes;Hypertension Hypertension Hypertension     Review Melanie Ray states she is maintaining her weight between 130-135 lb. She knows to look for any potential fluid gain with a sudden weight gain. Denies any heart failure symptoms at this time. Does not have a BP cuff but encouraged to get one so she can monitor her BP at home. She is diet controlled for her diabetes and sees PCP this Wednesday, 5/15. and may get A1C checked then. Melanie Ray blood pressure has been doing well. She does not check her blood pressure at home and informed her to get one if she was able to. Explained why it is important to take blood pressure at home while taking medicaions. Melanie Ray has not been checking her BP at home and does not own a BP cuff. She states that she will look into seeing if insurance will cover her getting a BP  cuff at home. Her pressures have stayed within normal ranges while at cardiac rehab.     Expected Outcomes Short: See PCP on Wednesday. buy BP cuff for at home monitoring Long: Continue to manage lifestyle risk factors Short: obtain a blood pressure cuff. Long: maintain blood pressure readings at home. Short: Obtain a blood pressure cuff. Long: Maintain blood pressure readings at home.              Core Components/Risk Factors/Patient Goals at Discharge (Final Review):   Goals and Risk Factor Review - 06/10/23 1633       Core Components/Risk Factors/Patient Goals Review   Personal Goals Review Hypertension    Review Melanie Ray has not been checking her BP at home and does not own a BP cuff. She states that she will look  into seeing if insurance will cover her getting a BP cuff at home. Her pressures have stayed within normal ranges while at cardiac rehab.    Expected Outcomes Short: Obtain a blood pressure cuff. Long: Maintain blood pressure readings at home.             ITP Comments:  ITP Comments     Row Name 02/20/23 1428 03/20/23 1204 03/27/23 1904 04/17/23 1140 05/14/23 1513   ITP Comments 30 day review completed. ITP sent to Dr. Bethann Punches, Medical Director of Cardiac Rehab. Continue with ITP unless changes are made by physician. 30 Day review completed. Medical Director ITP review done, changes made as directed, and signed approval by Medical Director. Per encounter dated from 4/9- patient is not taking warfarin anymore.  RN reviewed education with patient and confirmed medication list. New goals can be established by new RD for further nutrition goals if needed. 30 Day review completed. Medical Director ITP review done, changes made as directed, and signed approval by Medical Director. 30 Day review completed. Medical Director ITP review done, changes made as directed, and signed approval by Medical Director.    Row Name 06/11/23 1139 06/12/23 1210 07/10/23 1154 08/07/23 1248     ITP Comments Sporadic attendance, able to review goals. 30 Day review completed. Medical Director ITP review done, changes made as directed, and signed approval by Medical Director. 30 Day review completed. Medical Director ITP review done, changes made as directed, and signed approval by Medical Director. 30 Day review completed. Medical Director ITP review done, changes made as directed, and signed approval by Medical Director.             Comments:

## 2023-08-07 NOTE — Progress Notes (Signed)
Discharge Summary   Melanie Ray DOB: 12-19-2050   Melanie Ray graduated today from  rehab with 36 sessions completed.  Details of the patient's exercise prescription and what She needs to do in order to continue the prescription and progress were discussed with patient.  Patient was given a copy of prescription and goals.  Patient verbalized understanding. [name] plans to continue to exercise by walking.   6 Minute Walk     Row Name 07/15/23 1551         6 Minute Walk   Phase Discharge     Distance 1100 feet     Distance % Change 29 %     Distance Feet Change 280 ft     Walk Time 6 minutes     # of Rest Breaks 0     MPH 2.08     METS 2.69     RPE 13     Perceived Dyspnea  1     VO2 Peak 9.42     Symptoms No     Resting HR 85 bpm     Resting BP 122/60     Resting Oxygen Saturation  96 %     Exercise Oxygen Saturation  during 6 min walk 98 %     Max Ex. HR 103 bpm     Max Ex. BP 148/60

## 2023-08-08 ENCOUNTER — Other Ambulatory Visit: Payer: Self-pay | Admitting: Family Medicine

## 2023-08-08 ENCOUNTER — Telehealth: Payer: Self-pay | Admitting: *Deleted

## 2023-08-08 ENCOUNTER — Ambulatory Visit (INDEPENDENT_AMBULATORY_CARE_PROVIDER_SITE_OTHER): Payer: Medicare Other | Admitting: Emergency Medicine

## 2023-08-08 VITALS — Ht 63.0 in | Wt 135.0 lb

## 2023-08-08 DIAGNOSIS — F39 Unspecified mood [affective] disorder: Secondary | ICD-10-CM

## 2023-08-08 DIAGNOSIS — Z0000 Encounter for general adult medical examination without abnormal findings: Secondary | ICD-10-CM | POA: Diagnosis not present

## 2023-08-08 DIAGNOSIS — Z599 Problem related to housing and economic circumstances, unspecified: Secondary | ICD-10-CM | POA: Diagnosis not present

## 2023-08-08 DIAGNOSIS — F331 Major depressive disorder, recurrent, moderate: Secondary | ICD-10-CM | POA: Diagnosis not present

## 2023-08-08 DIAGNOSIS — F411 Generalized anxiety disorder: Secondary | ICD-10-CM | POA: Diagnosis not present

## 2023-08-08 NOTE — Patient Instructions (Signed)
Melanie Ray , Thank you for taking time to come for your Medicare Wellness Visit. I appreciate your ongoing commitment to your health goals. Please review the following plan we discussed and let me know if I can assist you in the future.   Referrals/Orders/Follow-Ups/Clinician Recommendations: Call suicide hotline at 84 or call 911 or go to nearest emergency room for mental health crisis. Get tetanus and flu vaccines at your earliest convenience. I have placed an urgent referral to community resources to help with mental health and finances. Make sure you go to your appointment with Dr. Birdie Sons scheduled 08/14/23.  This is a list of the screening recommended for you and due dates:  Health Maintenance  Topic Date Due   Hepatitis C Screening  Never done   DTaP/Tdap/Td vaccine (1 - Tdap) Never done   DEXA scan (bone density measurement)  08/25/2021   Yearly kidney health urinalysis for diabetes  07/07/2023   COVID-19 Vaccine (4 - 2023-24 season) 07/07/2023   Flu Shot  02/03/2024*   Yearly kidney function blood test for diabetes  05/14/2024   Medicare Annual Wellness Visit  08/07/2024   Colon Cancer Screening  03/29/2025   HPV Vaccine  Aged Out   Pneumonia Vaccine  Discontinued   Mammogram  Discontinued   Zoster (Shingles) Vaccine  Discontinued  *Topic was postponed. The date shown is not the original due date.    Advanced directives: (ACP Link)Information on Advanced Care Planning can be found at [facility] of [facility] Advance Health Care Directives Advance Health Care Directives ([email or web address]   Please bring a copy of your health care power of attorney and living will to the office to be added to your chart at your convenience.   Next Medicare Annual Wellness Visit scheduled for next year: Yes, 08/13/24 @ 4:00pm  Fall Prevention in the Home, Adult Falls can cause injuries and affect people of all ages. There are many simple things that you can do to make your home safe and  to help prevent falls. If you need it, ask for help making these changes. What actions can I take to prevent falls? General information Use good lighting in all rooms. Make sure to: Replace any light bulbs that burn out. Turn on lights if it is dark and use night-lights. Keep items that you use often in easy-to-reach places. Lower the shelves around your home if needed. Move furniture so that there are clear paths around it. Do not keep throw rugs or other things on the floor that can make you trip. If any of your floors are uneven, fix them. Add color or contrast paint or tape to clearly mark and help you see: Grab bars or handrails. First and last steps of staircases. Where the edge of each step is. If you use a ladder or stepladder: Make sure that it is fully opened. Do not climb a closed ladder. Make sure the sides of the ladder are locked in place. Have someone hold the ladder while you use it. Know where your pets are as you move through your home. What can I do in the bathroom?     Keep the floor dry. Clean up any water that is on the floor right away. Remove soap buildup in the bathtub or shower. Buildup makes bathtubs and showers slippery. Use non-skid mats or decals on the floor of the bathtub or shower. Attach bath mats securely with double-sided, non-slip rug tape. If you need to sit down while you are in the  shower, use a non-slip stool. Install grab bars by the toilet and in the bathtub and shower. Do not use towel bars as grab bars. What can I do in the bedroom? Make sure that you have a light by your bed that is easy to reach. Do not use any sheets or blankets on your bed that hang to the floor. Have a firm bench or chair with side arms that you can use for support when you get dressed. What can I do in the kitchen? Clean up any spills right away. If you need to reach something above you, use a sturdy step stool that has a grab bar. Keep electrical cables out of  the way. Do not use floor polish or wax that makes floors slippery. What can I do with my stairs? Do not leave anything on the stairs. Make sure that you have a light switch at the top and the bottom of the stairs. Have them installed if you do not have them. Make sure that there are handrails on both sides of the stairs. Fix handrails that are broken or loose. Make sure that handrails are as long as the staircases. Install non-slip stair treads on all stairs in your home if they do not have carpet. Avoid having throw rugs at the top or bottom of stairs, or secure the rugs with carpet tape to prevent them from moving. Choose a carpet design that does not hide the edge of steps on the stairs. Make sure that carpet is firmly attached to the stairs. Fix any carpet that is loose or worn. What can I do on the outside of my home? Use bright outdoor lighting. Repair the edges of walkways and driveways and fix any cracks. Clear paths of anything that can make you trip, such as tools or rocks. Add color or contrast paint or tape to clearly mark and help you see high doorway thresholds. Trim any bushes or trees on the main path into your home. Check that handrails are securely fastened and in good repair. Both sides of all steps should have handrails. Install guardrails along the edges of any raised decks or porches. Have leaves, snow, and ice cleared regularly. Use sand, salt, or ice melt on walkways during winter months if you live where there is ice and snow. In the garage, clean up any spills right away, including grease or oil spills. What other actions can I take? Review your medicines with your health care provider. Some medicines can make you confused or feel dizzy. This can increase your chance of falling. Wear closed-toe shoes that fit well and support your feet. Wear shoes that have rubber soles and low heels. Use a cane, walker, scooter, or crutches that help you move around if needed. Talk  with your provider about other ways that you can decrease your risk of falls. This may include seeing a physical therapist to learn to do exercises to improve movement and strength. Where to find more information Centers for Disease Control and Prevention, [employer]: TonerPromos.no [employer] on Aging: BaseRingTones.pl National Institute on Aging: BaseRingTones.pl Contact a health care provider if: You are afraid of falling at home. You feel weak, drowsy, or dizzy at home. You fall at home. Get help right away if you: Lose consciousness or have trouble moving after a fall. Have a fall that causes a head injury. These symptoms may be an emergency. Get help right away. Call 911. Do not wait to see if the symptoms will go away.  Do not drive yourself to the hospital. This information is not intended to replace advice given to you by your health care provider. Make sure you discuss any questions you have with your health care provider. Document Revised: 06/25/2022 Document Reviewed: 06/25/2022 Elsevier Patient Education  2024 [employer].

## 2023-08-08 NOTE — Patient Instructions (Signed)
Visit Information  Thank you for taking time to visit with me today. Please don't hesitate to contact me if I can be of assistance to you.   Following are the goals we discussed today:  CSW to assist with identifying mental health resources for ongoing mental health follow up   Our next appointment is by telephone on 08/09/23 at 11:30am  Please call the care guide team at [phone] if you need to cancel or reschedule your appointment.   If you are experiencing a Mental Health or Behavioral Health Crisis or need someone to talk to, please call the Suicide and Crisis Lifeline: 988   Patient verbalizes understanding of instructions and care plan provided today and agrees to view in MyChart. Active MyChart status and patient understanding of how to access instructions and care plan via MyChart confirmed with patient.     Telephone follow up appointment with care management team member scheduled for: 08/09/23  Verna Czech, LCSW [place]  [facility] Licensed Clinical Social Worker Care Coordinator  Direct Dial: [phone]

## 2023-08-08 NOTE — Progress Notes (Signed)
Subjective:   Melanie Ray is a 72 y.o. female who presents for Medicare Annual (Subsequent) preventive examination.  Visit Complete: Virtual  I connected with  Melanie Ray on 08/08/23 by a audio enabled telemedicine application and verified that I am speaking with the correct person using two identifiers.  Patient Location: Home  Provider Location: Home Office  I discussed the limitations of evaluation and management by telemedicine. The patient expressed understanding and agreed to proceed.  Because this visit was a virtual/telehealth visit, some criteria may be missing or patient reported. Any vitals not documented were not able to be obtained and vitals that have been documented are patient reported.    Cardiac Risk Factors include: advanced age (>69men, >76 women);hypertension;dyslipidemia;Other (see comment), Risk factor comments: OSA no cpap, preDM, CAD     Objective:    Today's Vitals   08/08/23 1335  Weight: 135 lb (61.2 kg)  Height: 5\' 3"  (1.6 m)   Body mass index is 23.91 kg/m.     12/26/2022    1:40 PM 10/30/2022    1:00 PM 10/24/2022    7:39 AM 10/14/2022    5:00 PM 10/14/2022   10:27 AM 10/04/2022    5:24 PM 10/04/2022   11:12 AM  Advanced Directives  Does Patient Have a Medical Advance Directive? No  No No No No No  Would patient like information on creating a medical advance directive? Yes (MAU/Ambulatory/Procedural Areas - Information given) No - Patient declined  Yes (Inpatient - patient defers creating a medical advance directive at this time - Information given)  No - Patient declined No - Patient declined    Current Medications (verified) Outpatient Encounter Medications as of 08/08/2023  Medication Sig   aspirin EC 81 MG tablet Take 1 tablet (81 mg total) by mouth daily. Swallow whole.   atorvastatin (LIPITOR) 80 MG tablet Take 1 tablet (80 mg total) by mouth daily.   carvedilol (COREG) 12.5 MG tablet Take 0.5 tablets (6.25 mg  total) by mouth 2 (two) times daily with a meal.   donepezil (ARICEPT) 10 MG tablet Take 10 mg by mouth at bedtime.   dorzolamide-timolol (COSOPT) 22.3-6.8 MG/ML ophthalmic solution Place 1 drop into both eyes 2 (two) times daily.    furosemide (LASIX) 40 MG tablet Take 40 mg by mouth as needed.   gabapentin (NEURONTIN) 400 MG capsule Take 1 capsule (400 mg total) by mouth 2 (two) times daily.   isosorbide mononitrate (IMDUR) 30 MG 24 hr tablet Take 30 mg by mouth daily.   latanoprost (XALATAN) 0.005 % ophthalmic solution Place 1 drop into both eyes at bedtime.   mirtazapine (REMERON) 7.5 MG tablet TAKE 1 TABLET BY MOUTH AT BEDTIME   QUEtiapine (SEROQUEL) 50 MG tablet    valsartan (DIOVAN) 80 MG tablet Take 1 tablet by mouth twice daily   memantine (NAMENDA) 5 MG tablet Take 5 mg by mouth daily. (Patient not taking: Reported on 05/31/2023)   spironolactone (ALDACTONE) 25 MG tablet Take 0.5 tablets (12.5 mg total) by mouth daily. (Patient not taking: Reported on 05/24/2023)   traZODone (DESYREL) 150 MG tablet Take by mouth at bedtime as needed for sleep. (Patient not taking: Reported on 08/08/2023)   No facility-administered encounter medications on file as of 08/08/2023.    Allergies (verified) Tetracycline, Meperidine, and Entresto [sacubitril-valsartan]   History: Past Medical History:  Diagnosis Date   ABLA (acute blood loss anemia) 10/24/2022   AICD (automatic cardioverter/defibrillator) present    Allergic rhinitis 09/25/2022  Back pain    Benign neoplasm of colon 04/27/2008   CAD (coronary artery disease) 04/11/2016   S/p ant STEMI 5/17: LHC >> LAD proximal 80%, mid 80%, distal 50%, ostial D1 60%; LCx with LPDA lesion 30%; RCA Mild calcification with no significant stenosis in a medium caliber, nondominant RCA; LVEF is estimated at 45% with inferoapical and lateral wall akinesis >> PCI: PCI: 3.5 x 24 mm Promus DES to prox LAD, 2.5 x 12 mm Promus DES to mid LAD.   Cerebellar stroke  03/30/2020   2021 MRI - Small, old left cerebellar infarct    Chest pain    Chronic HFrEF (heart failure with reduced ejection fraction) 10/25/2022   Chronic systolic CHF (congestive heart failure) 03/21/2016   Echo 01/30/17: Diff HK, mild focal basal septal hypertrophy, EF 30-35, mild AI, MAC, mild MR // Echo 06/08/16: Mild focal basal septal hypertrophy, EF 25-30%, diff HK, ant-septal AK, Gr 1 DD, mild AI, MAC, mild MR, PASP 37 mmHg // Echo 03/18/16: EF 30-35%, ant-septal AK, Gr 1 DD, mild MR, severe LAE.     Constipation    Diarrhea 01/02/2023   Dizziness    Dyspnea    Endotracheal tube present 10/24/2022   Essential hypertension 04/27/2008   Well-controlled today.  She will continue valsartan 80 mg twice a day, Imdur 30 mg daily, Lasix 20 mg every other day, [name] 10 mg daily, and carvedilol 12.5 mg twice daily.   Finger fracture 08/15/2021   She reports this is well-healing.  She will continue to see orthopedics.   Generalized anxiety disorder 04/27/2008   Glaucoma    Gout    History of acute anterior wall MI 03/17/2016   History of colonoscopy 04/27/2008   History of COVID-19 06/27/2022   HLD (hyperlipidemia) 07/28/2015   Check lipid panel.  She will continue Lipitor.   Hot flash, menopausal 02/07/2018   Will check with our clinical pharmacist regarding the black cohosh and primrose in this patient.   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   Iron deficiency anemia, unspecified 04/27/2008   Ischemic cardiomyopathy 10/02/2016   Joint pain    Left bundle branch block 12/03/2018   Chronic.  She will follow with cardiology.   Major depressive disorder 07/28/2015   Chronic ongoing issue.  Has worsened somewhat recently.  Notes passive SI.  Advised if she develops intent or plan to harm herself she needs to go to the emergency  room.  I advised her to contact her psychiatrist to arrange for follow-up and she was willing to do so.  She does have protective factors in place including her husband, dog, and nephew.   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  She will follow with cardiology.  We will send her EKG to her psychiatrist and have CMA contact the psychiatry office to inform them that the EKG was faxed and of the results.   Rash 02/07/2018   Possibly related to folliculitis or some other undetermined cause of her rash.  She will trial over-the-counter antibiotic ointment and if not beneficial she will let us know and we can refer her to dermatology.   S/P  mitral valve clip implantation 10/04/2022   MitraClip NTWx1 and NTx1 with Dr. Excell Seltzer and Dr. 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  She will continue [name] 10 mg daily.   Uterine leiomyoma 04/27/2008   Vitamin D deficiency 09/15/2010   Past Surgical History:  Procedure Laterality Date   APPENDECTOMY     BIV ICD INSERTION CRT-D N/A 01/02/2022   Procedure: BIV ICD INSERTION CRT-D;  Surgeon: Lanier Prude, MD;  Location: [facility] INVASIVE CV LAB;  Service: Cardiovascular;  Laterality: N/A;   CARDIAC CATHETERIZATION N/A 03/17/2016   Procedure: Left Heart Cath and Coronary Angiography;  Surgeon: Tonny Bollman, MD;  Location: [facility] INVASIVE CV LAB;  Service: Cardiovascular;  Laterality: N/A;   CARDIAC CATHETERIZATION N/A 03/17/2016   Procedure: Coronary Stent Intervention;  Surgeon:  Tonny Bollman, MD;  Location: [facility] INVASIVE CV LAB;  Service: Cardiovascular;  Laterality: N/A;   COLONOSCOPY WITH PROPOFOL N/A 08/13/2017   Procedure: COLONOSCOPY WITH PROPOFOL;  Surgeon: Wyline Mood, MD;  Location: [facility] ENDOSCOPY;  Service: Gastroenterology;  Laterality: N/A;   COLONOSCOPY WITH PROPOFOL N/A 03/29/2020   Procedure: COLONOSCOPY WITH PROPOFOL;  Surgeon: Midge Minium, MD;  Location: [facility] ENDOSCOPY;  Service: Endoscopy;  Laterality: N/A;   EXPLORATION POST OPERATIVE OPEN HEART N/A 10/24/2022   Procedure: EXPLORATION POST OPERATIVE OPEN HEART;  Surgeon: Eugenio Hoes, MD;  Location: [facility] OR;  Service: Open Heart Surgery;  Laterality: N/A;   MITRAL VALVE REPAIR N/A 10/04/2022   Procedure: MITRAL VALVE REPAIR;  Surgeon: Tonny Bollman, MD;  Location: [facility] INVASIVE CV LAB;  Service: Cardiovascular;  Laterality: N/A;   MITRAL VALVE REPLACEMENT N/A 10/24/2022   Procedure: MITRAL VALVE (MV) REPLACEMENT USING A 29 MM MEDTRONIC MOSAIC 310;  Surgeon: Eugenio Hoes, MD;  Location: [facility] OR;  Service: Open Heart Surgery;  Laterality: N/A;   RIGHT HEART CATH N/A 10/15/2022   Procedure: RIGHT HEART CATH;  Surgeon: Tonny Bollman, MD;  Location: [facility] INVASIVE CV LAB;  Service: Cardiovascular;  Laterality: N/A;   RIGHT/LEFT HEART CATH AND CORONARY ANGIOGRAPHY N/A 08/02/2022   Procedure: RIGHT/LEFT HEART CATH AND CORONARY ANGIOGRAPHY;  Surgeon: Dolores Patty, MD;  Location: [facility] INVASIVE CV LAB;  Service: Cardiovascular;  Laterality: N/A;   SMALL BOWEL REPAIR     TEE WITHOUT CARDIOVERSION N/A 08/02/2022   Procedure: TRANSESOPHAGEAL ECHOCARDIOGRAM (TEE);  Surgeon: Dolores Patty, MD;  Location: [facility] ENDOSCOPY;  Service: Cardiovascular;  Laterality: N/A;   TEE WITHOUT CARDIOVERSION N/A 10/04/2022   Procedure: TRANSESOPHAGEAL ECHOCARDIOGRAM (TEE);  Surgeon: Tonny Bollman, MD;  Location: [facility] INVASIVE CV LAB;  Service: Cardiovascular;  Laterality: N/A;   TEE WITHOUT CARDIOVERSION N/A 10/24/2022   Procedure:  TRANSESOPHAGEAL ECHOCARDIOGRAM (TEE);  Surgeon: Eugenio Hoes, MD;  Location: [facility] OR;  Service: Open Heart Surgery;  Laterality: N/A;   TONSILLECTOMY     UTERINE FIBROID SURGERY     Family History  Problem Relation Age of Onset   Anxiety disorder Mother    Paranoid behavior Mother    Hypertension Mother    Dementia Mother    High Cholesterol Mother    Diabetes Mother    Hyperlipidemia Mother    Depression Mother    Hypertension Father    High Cholesterol Father    Mood Disorder Sister  Stroke Sister    Anxiety disorder Maternal Aunt    Tuberculosis Paternal Grandfather    Drug abuse Cousin    Social History   Socioeconomic History   Marital status: Married    Spouse name: Gwen Sarvis   Number of children: 0   Years of education: 16   Highest education level: Bachelor's degree (e.g., BA, AB, BS)  Occupational History   Occupation: Retired    Comment: Runner, broadcasting/film/video  Tobacco Use   Smoking status: Former    Current packs/day: 0.00    Average packs/day: 0.5 packs/day for 25.0 years (12.5 ttl pk-yrs)    Types: Cigarettes    Start date: 04/13/1972    Quit date: 04/13/1997    Years since quitting: 26.3   Smokeless tobacco: Never  Vaping Use   Vaping status: Never Used  Substance and Sexual Activity   Alcohol use: Not Currently   Drug use: No   Sexual activity: Not Currently  Other Topics Concern   Not on file  Social History Narrative   Lives in [place] with spouse.  No children.   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   Left-handed   Lives in a two story home       Social Determinants of Health   Financial Resource Strain: Medium Risk (08/08/2023)   Overall Financial Resource Strain (CARDIA)    Difficulty of Paying Living Expenses: Somewhat hard  Food Insecurity: No Food Insecurity (08/08/2023)   Hunger Vital Sign    Worried About Running Out of Food in the Last Year: Never true    Ran  Out of Food in the Last Year: Never true  Transportation Needs: No Transportation Needs (08/08/2023)   PRAPARE - Administrator, Civil Service (Medical): No    Lack of Transportation (Non-Medical): No  Physical Activity: Inactive (08/08/2023)   Exercise Vital Sign    Days of Exercise per Week: 0 days    Minutes of Exercise per Session: 0 min  Stress: Stress Concern Present (08/08/2023)   [employer] of Occupational Health - Occupational Stress Questionnaire    Feeling of Stress : Very much  Social Connections: Moderately Integrated (08/08/2023)   Social Connection and Isolation Panel [NHANES]    Frequency of Communication with Friends and Family: More than three times a week    Frequency of Social Gatherings with Friends and Family: Never    Attends Religious Services: More than 4 times per year    Active Member of [employer] or Organizations: No    Attends Engineer, structural: Never    Marital Status: Married    Tobacco Counseling Counseling given: Not Answered   Clinical Intake:  Pre-visit preparation completed: Yes  Pain : No/denies pain     BMI - recorded: 23.91 Nutritional Status: BMI of 19-24  Normal Nutritional Risks: None Diabetes: No  How often do you need to have someone help you when you read instructions, pamphlets, or other written materials from your doctor or pharmacy?: 1 - Never  Interpreter Needed?: No  Information entered by :: Tora Kindred, CMA   Activities of Daily Living    08/08/2023    1:38 PM 10/30/2022    2:00 AM  In your present state of health, do you have any difficulty performing the following activities:  Hearing? 0 0  Vision? 0 0  Difficulty concentrating or making decisions? 1 0  Walking or climbing  stairs? 0 0  Dressing or bathing? 0 0  Doing errands, shopping? 0 0  Preparing Food and eating ? N   Using the Toilet? N   In the past six months, have you accidently leaked urine? N   Do you have problems with  loss of bowel control? N   Managing your Medications? N   Managing your Finances? N   Housekeeping or managing your Housekeeping? Y     Patient Care Team: Glori Luis, MD as PCP - General (Family Medicine) Tonny Bollman, MD as PCP - Cardiology (Cardiology) Lanier Prude, MD as PCP - Electrophysiology (Cardiology) Kennon Rounds as Physician Assistant (Cardiology) Bensimhon, Bevelyn Buckles, MD as Consulting Physician (Cardiology) Glendale Chard, DO as Consulting Physician (Neurology) Jeralyn Ruths, MD as Consulting Physician (Hematology and Oncology) Bassett, Clent Jacks, [place] as Social Worker  Indicate any recent Medical Services you may have received from other than [facility] providers in the past year (date may be approximate).     Assessment:   This is a routine wellness examination for [place].  Hearing/Vision screen Hearing Screening - Comments:: Denies hearing loss Vision Screening - Comments:: Gets routine eye exams   Goals Addressed               This Visit's Progress     Mental Health follow up        Interventions Today    Flowsheet Row Most Recent Value  General Interventions   General Interventions Discussed/Reviewed Communication with  Communication with --  [[facility] to confirm that referral has been received. it was confirmed that the referrals may take a couple of days to process, it was recommended that this social worker follow up on 8/2]           Patient Stated (pt-stated)        Get mental health counseling      Depression Screen    08/08/2023    1:54 PM 05/30/2023   10:27 AM 05/13/2023    4:37 PM 04/22/2023    8:58 AM 04/08/2023    4:07 PM 03/20/2023   11:59 AM 01/28/2023    5:18 PM  PHQ 2/9 Scores  PHQ - 2 Score 5 4 4 4 6 2 2   PHQ- 9 Score 18  18 13 10 8 4     Fall Risk    08/08/2023    2:17 PM 05/30/2023   10:24 AM 05/13/2023    4:37 PM 04/22/2023    8:57 AM 03/20/2023   11:59 AM  Fall Risk   Falls in the  past year? 1 1 0 1 0  Number falls in past yr: 0 0 0 0 0  Injury with Fall? 0 0 0 0 0  Risk for fall due to : History of fall(s);Mental status change;Medication side effect  No Fall Risks History of fall(s) No Fall Risks  Follow up Falls prevention discussed;Education provided;Falls evaluation completed   Falls evaluation completed Falls evaluation completed    MEDICARE RISK AT HOME: Medicare Risk at Home Any stairs in or around the home?: Yes If so, are there any without handrails?: No Home free of loose throw rugs in walkways, pet beds, electrical cords, etc?: Yes Adequate lighting in your home to reduce risk of falls?: Yes Life alert?: No Use of a cane, walker or w/c?: No Grab bars in the bathroom?: No Shower chair or bench in shower?: No Elevated toilet seat or a handicapped toilet?: No  TIMED UP AND GO:  Was the test performed?  No    Cognitive Function:      07/25/2022    9:00 AM  Montreal Cognitive Assessment   Visuospatial/ Executive (0/5) 4  Naming (0/3) 3  Attention: Read list of digits (0/2) 2  Attention: Read list of letters (0/1) 1  Attention: Serial 7 subtraction starting at 100 (0/3) 3  Language: Repeat phrase (0/2) 2  Language : Fluency (0/1) 0  Abstraction (0/2) 2  Delayed Recall (0/5) 0  Orientation (0/6) 6  Total 23  Adjusted Score (based on education) 23      08/08/2023    2:19 PM 03/30/2022    1:37 PM 05/03/2020    8:52 AM  6CIT Screen  What Year? 0 points 0 points 0 points  What month? 0 points 0 points 0 points  What time? 0 points 0 points   Count back from 20 0 points 0 points   Months in reverse 0 points 0 points 0 points  Repeat phrase 8 points 0 points 0 points  Total Score 8 points 0 points     Immunizations Immunization History  Administered Date(s) Administered   Influenza Inj Mdck Quad Pf 10/15/2018   Influenza Split 10/03/2005, 09/24/2008, 09/09/2009, 09/15/2010, 08/14/2011   Influenza, High Dose Seasonal PF 10/15/2018    Influenza,inj,Quad PF,6+ Mos 07/28/2015   Influenza-Unspecified 10/04/2016, 10/07/2017, 10/15/2018, 08/20/2020, 08/22/2021   PFIZER(Purple Top)SARS-COV-2 Vaccination 12/11/2019, 01/01/2020, 06/30/2020   Pneumococcal Polysaccharide-23 03/19/2016    TDAP status: Due, Education has been provided regarding the importance of this vaccine. Advised may receive this vaccine at local pharmacy or Health Dept. Aware to provide a copy of the vaccination record if obtained from local pharmacy or Health Dept. Verbalized acceptance and understanding.  Flu Vaccine status: Due, Education has been provided regarding the importance of this vaccine. Advised may receive this vaccine at local pharmacy or Health Dept. Aware to provide a copy of the vaccination record if obtained from local pharmacy or Health Dept. Verbalized acceptance and understanding.  Pneumococcal vaccine status: Declined,  Education has been provided regarding the importance of this vaccine but patient still declined. Advised may receive this vaccine at local pharmacy or Health Dept. Aware to provide a copy of the vaccination record if obtained from local pharmacy or Health Dept. Verbalized acceptance and understanding.   Covid-19 vaccine status: Information provided on how to obtain vaccines.   Qualifies for Shingles Vaccine? Yes   Zostavax completed No   Shingrix Completed?: No.    Education has been provided regarding the importance of this vaccine. Patient has been advised to call insurance company to determine out of pocket expense if they have not yet received this vaccine. Advised may also receive vaccine at local pharmacy or Health Dept. Verbalized acceptance and understanding.  Screening Tests Health Maintenance  Topic Date Due   Hepatitis C Screening  Never done   DTaP/Tdap/Td (1 - Tdap) Never done   DEXA SCAN  08/25/2021   INFLUENZA VACCINE  06/06/2023   Diabetic kidney evaluation - Urine ACR  07/07/2023   COVID-19 Vaccine (4 -  2023-24 season) 07/07/2023   Diabetic kidney evaluation - eGFR measurement  05/14/2024   Medicare Annual Wellness (AWV)  08/07/2024   Colonoscopy  03/29/2025   HPV VACCINES  Aged Out   Pneumonia Vaccine 67+ Years old  Discontinued   MAMMOGRAM  Discontinued   Zoster Vaccines- Shingrix  Discontinued    Health Maintenance  Health Maintenance Due  Topic Date  Due   Hepatitis C Screening  Never done   DTaP/Tdap/Td (1 - Tdap) Never done   DEXA SCAN  08/25/2021   INFLUENZA VACCINE  06/06/2023   Diabetic kidney evaluation - Urine ACR  07/07/2023   COVID-19 Vaccine (4 - 2023-24 season) 07/07/2023    Colorectal cancer screening: Type of screening: Colonoscopy. Completed 03/29/20. Repeat every 5 years  Mammogram Status: Patient declined  Bone Density status: Completed 08/26/19. Results reflect: Bone density results: OSTEOPENIA. Repeat every 2 years. Patient declined order.  Lung Cancer Screening: (Low Dose CT Chest recommended if Age 88-80 years, 20 pack-year currently smoking OR have quit w/in 15years.) does not qualify.   Lung Cancer Screening Referral: n/a  Additional Screening:  Hepatitis C Screening: does qualify; Patient declined.  Vision Screening: Recommended annual ophthalmology exams for early detection of glaucoma and other disorders of the eye. I Dental Screening: Recommended annual dental exams for proper oral hygiene   Community Resource Referral / Chronic Care Management: CRR required this visit?  Yes   CCM required this visit?  Appt scheduled with PCP     Plan:     I have personally reviewed and noted the following in the patient's chart:   Medical and social history Use of alcohol, tobacco or illicit drugs  Current medications and supplements including opioid prescriptions. Patient is not currently taking opioid prescriptions. Functional ability and status Nutritional status Physical activity Advanced directives List of other  physicians Hospitalizations, surgeries, and ER visits in previous 12 months Vitals Screenings to include cognitive, depression, and falls Referrals and appointments  In addition, I have reviewed and discussed with patient certain preventive protocols, quality metrics, and best practice recommendations. A written personalized care plan for preventive services as well as general preventive health recommendations were provided to patient.     Tora Kindred, CMA   08/08/2023   After Visit Summary: (MyChart) Due to this being a telephonic visit, the after visit summary with patients personalized plan was offered to patient via MyChart   Nurse Notes:  Patient having mental health crisis. Placed urgent community resource referral. Advised patient to call suicide hotline [phone], call 911 or go to ED. Patient agreed. Offered patient appointment with PCP for 08/09/23 and 08/13/23. Patient declined, but did schedule for 08/14/23. 6 CIT score - 8 Needs flu and Tdap vaccines. Declined pneumonia and shingles vaccines.

## 2023-08-08 NOTE — Patient Outreach (Signed)
Care Coordination   Follow Up Visit Note   08/08/2023 Name: Melanie Ray MRN: [id number] DOB: 1951-09-16  Melanie Ray is a 72 y.o. year old female who sees Birdie Sons, Yehuda Mao, MD for primary care. I spoke with  [name] by phone today.  What matters to the patients health and wellness today?  Patient requesting assistance with ongoing mental health counseling to manage symptoms of depression and anxiety. Patient discussed feeling overwhelmed earlier today-patient now experiencing improvement in mood. Patient able to verbalize positive coping strategies to manage mood. CSW to assist with ongoing mental health counseling.    Goals Addressed             This Visit's Progress    Mental Health follow up       Interventions Today    Flowsheet Row Most Recent Value  Chronic Disease   Chronic disease during today's visit Other  [depression, anxiety]  General Interventions   General Interventions Discussed/Reviewed General Interventions Discussed  Mental Health Interventions   Mental Health Discussed/Reviewed Mental Health Discussed, Depression, Anxiety, Crisis  [patient assessed, denies thoughts of harm to self or others, agreeable to presenting  to the ED or calling  988 in the event of a crisis. supportive counseling provided-self care and safety prioritized]  Safety Interventions   Safety Discussed/Reviewed Safety Discussed              SDOH assessments and interventions completed:  No     Care Coordination Interventions:  Yes, provided   Follow up plan: Follow up call scheduled for 08/09/23    Encounter Outcome:  Patient Visit Completed

## 2023-08-09 ENCOUNTER — Ambulatory Visit: Payer: Self-pay | Admitting: *Deleted

## 2023-08-09 NOTE — Patient Instructions (Signed)
Visit Information  Thank you for taking time to visit with me today. Please don't hesitate to contact me if I can be of assistance to you.   Following are the goals we discussed today:   Goals Addressed             This Visit's Progress    Mental Health follow up       Activities and task to complete in order to accomplish goals.   EMOTIONAL / MENTAL HEALTH SUPPORT Call [facility] [phone] to schedule your counseling appointment. Keep all upcoming appointment discussed today Self Support options  (continue to discuss the arrangement of payment plans to address financial stressors, continue to reach out to family and friends for support when needed)           Our next appointment is by telephone on 08/16/23 at 1pm  Please call the care guide team at [phone] if you need to cancel or reschedule your appointment.   If you are experiencing a Mental Health or Behavioral Health Crisis or need someone to talk to, please call the Suicide and Crisis Lifeline: [phone]   Patient verbalizes understanding of instructions and care plan provided today and agrees to view in MyChart. Active MyChart status and patient understanding of how to access instructions and care plan via MyChart confirmed with patient.     Telephone follow up appointment with care management team member scheduled for: 08/16/23 Verna Czech, LCSW [place]  [facility], [facility] Licensed Clinical Social Worker Care Coordinator  Direct Dial: [phone]

## 2023-08-09 NOTE — Patient Outreach (Signed)
Care Coordination   Follow Up Visit Note   08/09/2023 Name: Britlyn Mcnamer MRN: [id number] DOB: 1951/02/21  Aasia Dowlen is a 72 y.o. year old female who sees [facility], Yehuda Mao, MD for primary care. I spoke with  [name] by phone today.  What matters to the patients health and wellness today?  Mental Heath follow up    Goals Addressed             This Visit's Progress    Mental Health follow up       Activities and task to complete in order to accomplish goals.   EMOTIONAL / MENTAL HEALTH SUPPORT Call [facility] [phone] to schedule your counseling appointment. Keep all upcoming appointment discussed today Self Support options  (continue to discuss the arrangement of payment plans to address financial stressors, continue to reach out to family and friends for support when needed)           SDOH assessments and interventions completed:  No     Care Coordination Interventions:  Yes, provided  Interventions Today    Flowsheet Row Most Recent Value  Chronic Disease   Chronic disease during today's visit Other  [depression, anxiety]  General Interventions   General Interventions Discussed/Reviewed General Interventions Reviewed  [confirmed concerns related to financial difficutlies-spouse in long-term care, applied for financial hardship for mortgage and working on payment arrangements for additional bills]  Mental Health Interventions   Mental Health Discussed/Reviewed Mental Health Reviewed, Coping Strategies, Depression, Anxiety  [supportive counseling provided related to financial stressors, confirmed improvement in mood due to dev of  plan to address financial challenges, positive reinforcement provided for use of pos coping strategies to manage mood,  inlcuding ongoing MH f/u]  Safety Interventions   Safety Discussed/Reviewed Safety Reviewed       Follow up plan: Follow up call scheduled for 08/16/23    Encounter Outcome:   Patient Visit Completed

## 2023-08-12 ENCOUNTER — Telehealth: Payer: Self-pay | Admitting: *Deleted

## 2023-08-12 NOTE — Patient Outreach (Signed)
Care Coordination   Follow Up Visit Note   08/13/2023 Name: Melanie Ray MRN: [id number] DOB: May 16, 1951  Melanie Ray is a 72 y.o. year old female who sees [facility], Yehuda Mao, MD for primary care. I spoke with  Melanie Ray by phone today.  What matters to the patients health and wellness today?  Mental health Follow up    Goals Addressed             This Visit's Progress    Mental Health follow up       Activities and task to complete in order to accomplish goals.   EMOTIONAL / MENTAL HEALTH SUPPORT CSW to continue to continue to identify mental health/medication management options for patient Continue to keep all upcoming appointments discussed today Self Support options  (continue to discuss the arrangement of payment plans to address financial stressors, continue to reach out to family and friends for support when needed)           SDOH assessments and interventions completed:  No     Care Coordination Interventions:  Yes, provided  Interventions Today    Flowsheet Row Most Recent Value  Chronic Disease   Chronic disease during today's visit Other  [depression, anxiety]  General Interventions   General Interventions Discussed/Reviewed General Interventions Reviewed, Community Resources  Mental Health Interventions   Mental Health Discussed/Reviewed Mental Health Reviewed, Coping Strategies, Depression, Anxiety  [confirmed f/u call with [facility]-they do not accept Medicare, [facility]  contacted they accepted medicare but do not provide medication management-patient requesting assistance with coordinating medication management]       Follow up plan: Follow up call scheduled for 08/14/23    Encounter Outcome:  Patient Visit Completed

## 2023-08-13 NOTE — Patient Instructions (Signed)
Visit Information  Thank you for taking time to visit with me today. Please don't hesitate to contact me if I can be of assistance to you.   Following are the goals we discussed today:   Goals Addressed             This Visit's Progress    Mental Health follow up       Activities and task to complete in order to accomplish goals.   EMOTIONAL / MENTAL HEALTH SUPPORT CSW to continue to continue to identify mental health/medication management options for patient Continue to keep all upcoming appointments discussed today Self Support options  (continue to discuss the arrangement of payment plans to address financial stressors, continue to reach out to family and friends for support when needed)           Our next appointment is by telephone on 08/8923 at 11:30am  Please call the care guide team at [phone] if you need to cancel or reschedule your appointment.   If you are experiencing a Mental Health or Behavioral Health Crisis or need someone to talk to, please call the Suicide and Crisis Lifeline: 988   Patient verbalizes understanding of instructions and care plan provided today and agrees to view in MyChart. Active MyChart status and patient understanding of how to access instructions and care plan via MyChart confirmed with patient.     Telephone follow up appointment with care management team member scheduled for: 08/14/23   Verna Czech, LCSW [place]  [facility], [facility] Licensed Clinical Social Worker Care Coordinator  Direct Dial: [phone]

## 2023-08-14 ENCOUNTER — Ambulatory Visit: Payer: Medicare Other | Attending: Cardiology | Admitting: Cardiology

## 2023-08-14 ENCOUNTER — Telehealth: Payer: Self-pay | Admitting: Cardiology

## 2023-08-14 ENCOUNTER — Encounter: Payer: Self-pay | Admitting: Family Medicine

## 2023-08-14 ENCOUNTER — Ambulatory Visit: Payer: Self-pay | Admitting: *Deleted

## 2023-08-14 ENCOUNTER — Encounter: Payer: Self-pay | Admitting: Cardiology

## 2023-08-14 ENCOUNTER — Ambulatory Visit: Payer: Medicare Other | Admitting: Family Medicine

## 2023-08-14 VITALS — BP 132/76 | HR 80 | Temp 98.3°F | Ht 63.0 in | Wt 137.8 lb

## 2023-08-14 VITALS — BP 142/70 | HR 88 | Ht 63.0 in | Wt 138.5 lb

## 2023-08-14 DIAGNOSIS — I480 Paroxysmal atrial fibrillation: Secondary | ICD-10-CM | POA: Diagnosis not present

## 2023-08-14 DIAGNOSIS — F331 Major depressive disorder, recurrent, moderate: Secondary | ICD-10-CM | POA: Diagnosis not present

## 2023-08-14 DIAGNOSIS — I5022 Chronic systolic (congestive) heart failure: Secondary | ICD-10-CM

## 2023-08-14 DIAGNOSIS — R232 Flushing: Secondary | ICD-10-CM | POA: Diagnosis not present

## 2023-08-14 DIAGNOSIS — Z95810 Presence of automatic (implantable) cardiac defibrillator: Secondary | ICD-10-CM | POA: Diagnosis not present

## 2023-08-14 DIAGNOSIS — I447 Left bundle-branch block, unspecified: Secondary | ICD-10-CM | POA: Diagnosis not present

## 2023-08-14 LAB — CUP PACEART INCLINIC DEVICE CHECK
Battery Remaining Longevity: 60 mo
Brady Statistic RA Percent Paced: 93 %
Brady Statistic RV Percent Paced: 99.04 %
Date Time Interrogation Session: 20241009165546
HighPow Impedance: 55.125
Implantable Lead Connection Status: 753985
Implantable Lead Connection Status: 753985
Implantable Lead Connection Status: 753985
Implantable Lead Implant Date: 20230228
Implantable Lead Implant Date: 20230228
Implantable Lead Implant Date: 20230228
Implantable Lead Location: 753858
Implantable Lead Location: 753859
Implantable Lead Location: 753860
Implantable Lead Model: 3830
Implantable Pulse Generator Implant Date: 20230228
Lead Channel Impedance Value: 400 Ohm
Lead Channel Impedance Value: 425 Ohm
Lead Channel Impedance Value: 575 Ohm
Lead Channel Pacing Threshold Amplitude: 0.5 V
Lead Channel Pacing Threshold Amplitude: 0.5 V
Lead Channel Pacing Threshold Amplitude: 0.875 V
Lead Channel Pacing Threshold Amplitude: 2.25 V
Lead Channel Pacing Threshold Amplitude: 2.25 V
Lead Channel Pacing Threshold Pulse Width: 0.5 ms
Lead Channel Pacing Threshold Pulse Width: 0.5 ms
Lead Channel Pacing Threshold Pulse Width: 0.5 ms
Lead Channel Pacing Threshold Pulse Width: 1 ms
Lead Channel Pacing Threshold Pulse Width: 1 ms
Lead Channel Sensing Intrinsic Amplitude: 12 mV
Lead Channel Sensing Intrinsic Amplitude: 3.1 mV
Lead Channel Setting Pacing Amplitude: 0.25 V
Lead Channel Setting Pacing Amplitude: 2 V
Lead Channel Setting Pacing Amplitude: 2 V
Lead Channel Setting Pacing Pulse Width: 0.05 ms
Lead Channel Setting Pacing Pulse Width: 0.5 ms
Lead Channel Setting Sensing Sensitivity: 0.5 mV
Pulse Gen Serial Number: 8904264
Zone Setting Status: 755011

## 2023-08-14 NOTE — Assessment & Plan Note (Signed)
Chronic ongoing issue.  Gabapentin 400 mg twice daily has not been beneficial.  Discussed she may need to try Veozah.  She sees her gynecologist next week and she will discuss that with them.  Discussed the risk of liver damage with this medication and the need to monitor liver enzymes while on it.

## 2023-08-14 NOTE — Patient Instructions (Signed)
Visit Information  Thank you for taking time to visit with me today. Please don't hesitate to contact me if I can be of assistance to you.   Following are the goals we discussed today:   Goals Addressed             This Visit's Progress    Mental Health follow up       Activities and task to complete in order to accomplish goals.   EMOTIONAL / MENTAL HEALTH SUPPORT Patient to follow up with [facility] on 08/28/23 at 3pm( appt can be in virtual or in-person) Continue to keep all upcoming appointments discussed today Self Support options  (continue to discuss the arrangement of payment plans to address financial stressors, continue to reach out to family and friends for support when needed)           Our next appointment is by telephone on 08/27/23 at 3:30pm  Please call the care guide team at [phone] if you need to cancel or reschedule your appointment.   If you are experiencing a Mental Health or Behavioral Health Crisis or need someone to talk to, please call the Suicide and Crisis Lifeline: 988   Patient verbalizes understanding of instructions and care plan provided today and agrees to view in MyChart. Active MyChart status and patient understanding of how to access instructions and care plan via MyChart confirmed with patient.     Telephone follow up appointment with care management team member scheduled for: 08/27/23   Verna Czech, LCSW [place]  [facility], [facility] Licensed Clinical Social Worker Care Coordinator  Direct Dial: [phone]

## 2023-08-14 NOTE — Assessment & Plan Note (Addendum)
Chronic issue.  Continues to be an issue.  Discussed potentially increasing mirtazapine though there was a warning regarding QT prolongation.  I will check with our clinical pharmacy on this prior to increasing the dose.  She will continue to see neurology for Seroquel.  I will place another referral for psychiatry.  I will send a message to the social worker she has been talking with to see if they have any resources for the patient.  Discussed if she developed active suicidal thoughts she needs to seek medical attention in the emergency department.

## 2023-08-14 NOTE — Patient Instructions (Signed)
Nice to see you. Please let me know if you do not hear from psychiatry within the next 2 weeks.

## 2023-08-14 NOTE — Progress Notes (Signed)
Electrophysiology Office Follow up Visit Note:    Date:  08/14/2023   ID:  Melanie, Ray 10/08/1951, MRN [id number]  PCP:  Glori Luis, MD  [facility] Cardiologist:  Tonny Bollman, MD  [facility] Electrophysiologist:  Lanier Prude, MD    Interval History:    Melanie Ray is a 72 y.o. female who presents for a follow up visit.   Last seen October 10, 2022.  She has a history of MitraClip, chronic systolic heart failure, left bundle branch block. The patient has a relatively recent history of device detected atrial fibrillation and flutter.  Burden less than 1%.  She has a history of left atrial appendage ligation during previous open heart surgery.  During her October 24, 2022 surgery, her left atrial appendage was oversewn with a 4-0 Prolene suture.  She has been doing well.  She does feel fatigue throughout the day.  Some days are worse than others.  No problems with her medications.  No syncope or presyncope.    Past medical, surgical, social and family history were reviewed.  ROS:   Please see the history of present illness.    All other systems reviewed and are negative.  EKGs/Labs/Other Studies Reviewed:    The following studies were reviewed today:  August 14, 2023 in clinic device interrogation personally reviewed Battery parameters stable RV capture threshold elevated at 2.25 at 1 ms today.  Her auto capture was not accurately assessing capture thresholds.  I have reprogrammed the device to have subthreshold outputs on the RV ICD lead.  We will continue pacing via the left bundle lead.  High-voltage impedance is stable on the RV ICD lead.        Physical Exam:    VS:  BP (!) 142/70 (BP Location: Left Arm, Patient Position: Sitting, Cuff Size: Normal)   Pulse 88   Ht 5\' 3"  (1.6 m)   Wt 138 lb 8 oz (62.8 kg)   SpO2 99%   BMI 24.53 kg/m     Wt Readings from Last 3 Encounters:  08/14/23 138 lb 8 oz (62.8 kg)  08/14/23  137 lb 12.8 oz (62.5 kg)  08/08/23 135 lb (61.2 kg)     GEN:  Well nourished, well developed in no acute distress CARDIAC: RRR, no murmurs, rubs, gallops.  CIED pocket well-healed RESPIRATORY:  Clear to auscultation without rales, wheezing or rhonchi       ASSESSMENT:    1. Chronic systolic heart failure (HCC)   2. Left bundle branch block   3. LBBB (left bundle branch block)   4. ICD (implantable cardioverter-defibrillator) in place   5. Paroxysmal atrial fibrillation (HCC)    PLAN:    In order of problems listed above:  #Chronic systolic heart failure #Left bundle-branch block #ICD in situ NYHA class II.  Paced QRS is about 140 ms Last EF 30-35 % Continue Coreg, Lasix, Imdur, spironolactone, valsartan  #Atrial fibrillation/flutter 19 AMS episodes since January on September device check.  Longest episode 9 hours and 50 minutes.  Left atrial appendage ligation history as above.  I discussed the role of anticoagulation in patients with had a left atrial appendage ligation.  We discussed the limited data to guide decisions.  I have recommended that we assess the patency of the left atrial appendage.  If it is in fact closed without any residual communication or significant stump, favor avoiding exposure anticoagulation at this time.  If there is a large communication into the  distal appendage or if there is a large residual stump, recommend starting anticoagulation.  Plan for CT scan to assess appendage patency.  #Elevated RV capture threshold CT scan as above will also assess location of RV ICD lead.  I suspect exit block given the electrical parameters assessed today.  High-voltage impedance is stable and the lead.  I have reprogrammed the device to use her left bundle area lead for pacing support.  Will use the ICD lead for high-voltage therapies alone.   Follow-up 6 months with APP or sooner as needed based on CT results.       Signed, Steffanie Dunn, MD, [facility],  [facility] 08/14/2023 3:33 PM    Electrophysiology [facility]

## 2023-08-14 NOTE — Progress Notes (Signed)
Marikay Alar, MD Phone: [phone]  Melanie Ray is a 72 y.o. female who presents today for follow-up.  Anxiety/depression: Patient notes this continues to be an issue.  She notes the mirtazapine has helped with her sleep.  She wonders about increasing the dose of the mirtazapine.  She is still not sleeping straight through the night.  She has frequent thoughts of being better off dead though has no intent or plan to harm herself and does not want to die.  She has her dog and husband to live for.  She has had trouble getting into see psychiatry.  She notes a lot of her stressors come from her husband who is in a nursing home though after December she is going to run out of funds to be able to pay for this.  She notes they were denied for Medicaid and also denied for VA benefits.  Hot flashes: These continue to be an issue.  She notes she discussed Veozah with gynecology.  She is already on gabapentin and notes that has not provided any benefit.  Social History   Tobacco Use  Smoking Status Former   Current packs/day: 0.00   Average packs/day: 0.5 packs/day for 25.0 years (12.5 ttl pk-yrs)   Types: Cigarettes   Start date: 04/13/1972   Quit date: 04/13/1997   Years since quitting: 26.3  Smokeless Tobacco Never    Current Outpatient Medications on File Prior to Visit  Medication Sig Dispense Refill   aspirin EC 81 MG tablet Take 1 tablet (81 mg total) by mouth daily. Swallow whole. 30 tablet 12   atorvastatin (LIPITOR) 80 MG tablet Take 1 tablet (80 mg total) by mouth daily. 90 tablet 3   carvedilol (COREG) 12.5 MG tablet Take 0.5 tablets (6.25 mg total) by mouth 2 (two) times daily with a meal. 60 tablet 1   donepezil (ARICEPT) 10 MG tablet Take 10 mg by mouth at bedtime.     dorzolamide-timolol (COSOPT) 22.3-6.8 MG/ML ophthalmic solution Place 1 drop into both eyes 2 (two) times daily.      furosemide (LASIX) 40 MG tablet Take 40 mg by mouth as needed.     gabapentin  (NEURONTIN) 400 MG capsule Take 1 capsule (400 mg total) by mouth 2 (two) times daily. 60 capsule 3   isosorbide mononitrate (IMDUR) 30 MG 24 hr tablet Take 30 mg by mouth daily.     latanoprost (XALATAN) 0.005 % ophthalmic solution Place 1 drop into both eyes at bedtime.     mirtazapine (REMERON) 7.5 MG tablet TAKE 1 TABLET BY MOUTH AT BEDTIME 30 tablet 0   QUEtiapine (SEROQUEL) 50 MG tablet      valsartan (DIOVAN) 80 MG tablet Take 1 tablet by mouth twice daily 180 tablet 0   No current facility-administered medications on file prior to visit.     ROS see history of present illness  Objective  Physical Exam Vitals:   08/14/23 1023  BP: 132/76  Pulse: 80  Temp: 98.3 F (36.8 C)  SpO2: 99%    BP Readings from Last 3 Encounters:  08/14/23 132/76  05/31/23 118/68  05/13/23 138/80   Wt Readings from Last 3 Encounters:  08/14/23 137 lb 12.8 oz (62.5 kg)  08/08/23 135 lb (61.2 kg)  07/15/23 136 lb (61.7 kg)    Physical Exam Constitutional:      General: She is not in acute distress.    Appearance: She is not diaphoretic.  Cardiovascular:     Rate and Rhythm:  Normal rate and regular rhythm.     Heart sounds: Normal heart sounds.  Pulmonary:     Effort: Pulmonary effort is normal.     Breath sounds: Normal breath sounds.  Skin:    General: Skin is warm and dry.  Neurological:     Mental Status: She is alert.      Assessment/Plan: Please see individual problem list.  Moderate episode of recurrent major depressive disorder (HCC) Assessment & Plan: Chronic issue.  Continues to be an issue.  Discussed potentially increasing mirtazapine though there was a warning regarding QT prolongation.  I will check with our clinical pharmacy on this prior to increasing the dose.  She will continue to see neurology for Seroquel.  I will place another referral for psychiatry.  I will send a message to the social worker she has been talking with to see if they have any resources for  the patient.  Discussed if she developed active suicidal thoughts she needs to seek medical attention in the emergency department.  Orders: -     Ambulatory referral to Psychiatry  Hot flashes Assessment & Plan: Chronic ongoing issue.  Gabapentin 400 mg twice daily has not been beneficial.  Discussed she may need to try Veozah.  She sees her gynecologist next week and she will discuss that with them.  Discussed the risk of liver damage with this medication and the need to monitor liver enzymes while on it.      Return in about 2 months (around 10/14/2023) for Anxiety and depression.   Marikay Alar, MD [facility]

## 2023-08-14 NOTE — Patient Instructions (Addendum)
Medication Instructions:  Your physician recommends that you continue on your current medications as directed. Please refer to the Current Medication list given to you today.  *If you need a refill on your cardiac medications before your next appointment, please call your pharmacy*  Lab Work: TODAY: BMET  Testing/Procedures: Your physician has requested that you have cardiac CT. Cardiac computed tomography (CT) is a painless test that uses an x-ray machine to take clear, detailed pictures of your heart. For further information please visit [email or web address] Please follow instruction sheet as given.  Follow-Up: At [facility], you and your health needs are our priority.  As part of our continuing mission to provide you with exceptional heart care, we have created designated Provider Care Teams.  These Care Teams include your primary Cardiologist (physician) and Advanced Practice Providers (APPs -  Physician Assistants and Nurse Practitioners) who all work together to provide you with the care you need, when you need it.  Your next appointment:   6 months  Provider:   You will see one of the following Advanced Practice Providers on your designated Care Team:   Francis Dowse, Charlott Holler [address] [place], [place] Sherie Don, NP Canary Brim, NP

## 2023-08-14 NOTE — Telephone Encounter (Signed)
Pt's sister Angelique Blonder is requesting a callback regarding pt calling her hysterically crying and screaming through the phone saying she was bleeding all the the place from having her labs drawn. Pt told her she had blood running down her arm, in her purse, in the elevator and all over her sweater. Angelique Blonder stated she asked her why'd she leave instead of going back into the office but pt was in such a panic she couldn't answer the question nor could the pt tell her if she was still bleeding and where she was at the moment since she left. Angelique Blonder lives in [place], [place] but is trying her best to remedy the situation. Please advise.

## 2023-08-14 NOTE — Patient Outreach (Signed)
Care Coordination   Follow Up Visit Note   08/14/2023 Name: Melanie Ray MRN: [id number] DOB: February 10, 1951  Melanie Ray is a 72 y.o. year old female who sees [facility], Yehuda Mao, MD for primary care. I spoke with  Melanie Ray by phone today.  What matters to the patients health and wellness today?  Mental health follow up and resources to address financial challenges    Goals Addressed             This Visit's Progress    Mental Health follow up       Activities and task to complete in order to accomplish goals.   EMOTIONAL / MENTAL HEALTH SUPPORT Patient to follow up with [facility] on 08/28/23 at 3pm( appt can be in virtual or in-person) Continue to keep all upcoming appointments discussed today Self Support options  (continue to discuss the arrangement of payment plans to address financial stressors, continue to reach out to family and friends for support when needed)           SDOH assessments and interventions completed:  Yes     Care Coordination Interventions:  Yes, provided  Interventions Today    Flowsheet Row Most Recent Value  Chronic Disease   Chronic disease during today's visit Other  [depression, anxiety]  General Interventions   General Interventions Discussed/Reviewed General Interventions Reviewed, Communication with  [financial challenges discussed-payment arrangements made for the next 3 months for spouse CSW to assist with available resources to address financial challenges]  Communication with --  [facility]- [phone] confirmed that they are a in-network provider]  Mental Health Interventions   Mental Health Discussed/Reviewed Mental Health Reviewed, Coping Strategies, Depression, Anxiety  [per pt  mood is stable, managed by medication- pt to continue to reach out to supportive friends/family  who remain enocuraging and uplifiting telehealth scheduled with [facility] 08/28/23 3pm appt will be virtual-emotional  support/pos reinf  provided]  Safety Interventions   Safety Discussed/Reviewed Safety Discussed     \ Follow up plan: Follow up call scheduled for 08/27/23    Encounter Outcome:  Patient Visit Completed

## 2023-08-14 NOTE — Telephone Encounter (Signed)
Spoke with the patient who states that after she had her labs drawn her arm was not wrapped properly and she was bleeding all down her arm. She states that it ruined her sweater and also got into her purse. She states that it has since stopped bleeding. She would like to know if she can be reimbursed for her sweater that was ruined. I advised that I am not sure what we will be able to do to help her but I will let my supervisor know.

## 2023-08-15 LAB — BASIC METABOLIC PANEL
BUN/Creatinine Ratio: 30 — ABNORMAL HIGH (ref 12–28)
BUN: 26 mg/dL (ref 8–27)
CO2: 23 mmol/L (ref 20–29)
Calcium: 9.9 mg/dL (ref 8.7–10.3)
Chloride: 107 mmol/L — ABNORMAL HIGH (ref 96–106)
Creatinine, Ser: 0.86 mg/dL (ref 0.57–1.00)
Glucose: 93 mg/dL (ref 70–99)
Potassium: 4.3 mmol/L (ref 3.5–5.2)
Sodium: 142 mmol/L (ref 134–144)
eGFR: 71 mL/min/{1.73_m2} (ref >59)

## 2023-08-15 NOTE — Telephone Encounter (Signed)
I spoke with Melanie Ray, Department Director, and she advised she was already aware of this situation and that LaToya's supervisor for [facility] had already been made aware as well. There was nothing further for me to do on my end.

## 2023-08-16 ENCOUNTER — Encounter: Payer: Medicare Other | Admitting: *Deleted

## 2023-08-21 ENCOUNTER — Ambulatory Visit: Payer: Medicare Other

## 2023-08-23 ENCOUNTER — Telehealth: Payer: Self-pay | Admitting: Family Medicine

## 2023-08-23 NOTE — Telephone Encounter (Signed)
-----   Message from Loree Fee sent at 08/15/2023 11:34 AM EDT ----- Elvina Sidle Dr. 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 Looks like she had EKG in March and is followed by cards. March QTc/QTcB 458/518 ms, previous was 454/533. QT remains elevated, but is stable.   As QTc has been stable on current meds, I would agree that best course is just continuing to see cards/monitor. Mirtazapine overall is on the list of "low risk" QTc prolonging agents meaning that it may confer some degree of prolongation though risk is slight compared to other medication with more profound effects.   Regarding risk reduction, her Mg and K were normal on last labs (though this was Dec '23).   Thanks!  Lillia Abed ----- Message ----- From: Glori Luis, MD Sent: 08/14/2023  10:18 AM EDT To: Loree Fee, RPH  Thea Gist, I was planning on increasing this patient's mirtazapine from 15 mg to 22.5 mg though the computer gave me a warning about QT prolongation with this in combination with the Aricept.  Is this truly a concern with this dosage of mirtazapine?  Or is it more likely to happen at higher doses?  Thanks.

## 2023-08-23 NOTE — Telephone Encounter (Signed)
Noted.  I will forward this message to our referral coordinator for her to check on the psychiatry referral for the patient.  Thanks.

## 2023-08-23 NOTE — Telephone Encounter (Signed)
No, she has not heard from Psychiatry.

## 2023-08-23 NOTE — Telephone Encounter (Signed)
Please let the patient know that I heard back from our pharmacist on the potential for increasing mirtazapine dose.  There is some risk of this medication because QT prolongation which could put her heart into an odd rhythm that could kill her.  At this point I do not think we should increase the dose of mirtazapine and I think we need to have her see psychiatry to get their input on the best medication management for her.  Has she heard from psychiatry yet?

## 2023-08-27 ENCOUNTER — Ambulatory Visit: Payer: Self-pay | Admitting: *Deleted

## 2023-08-27 DIAGNOSIS — N951 Menopausal and female climacteric states: Secondary | ICD-10-CM | POA: Diagnosis not present

## 2023-08-27 NOTE — Patient Outreach (Addendum)
Care Coordination   Follow Up Visit Note   08/27/2023 Name: Melanie Ray MRN: [id number] DOB: [birth year]  Melanie Ray is a 72 y.o. year old female who sees Birdie Sons, Yehuda Mao, MD for primary care. I spoke with  [name] by phone today.  What matters to the patients health and wellness today?  Mental Health follow up for medication management    Goals Addressed             This Visit's Progress    Mental Health follow up       Activities and task to complete in order to accomplish goals.   EMOTIONAL / MENTAL HEALTH SUPPORT Patient to follow up with [facility] on 10/23524 at 5pm( appt will be virtual ) Continue to keep all upcoming appointments discussed today Self Support options  (continue to discuss the arrangement of payment plans to address financial stressors, continue to reach out to family and friends for support when needed)           SDOH assessments and interventions completed:  No     Care Coordination Interventions:  Yes, provided  Interventions Today    Flowsheet Row Most Recent Value  Chronic Disease   Chronic disease during today's visit Other  [depression, anxiety]  General Interventions   General Interventions Discussed/Reviewed General Interventions Reviewed, [facility], Communication with  Communication with PCP/Specialists  [[facility] -confirmed virtual appt for 08/30/23 at 5pm]  Mental Health Interventions   Mental Health Discussed/Reviewed Mental Health Reviewed, Coping Strategies, Anxiety, Depression  [confirmed that patient's initial appt for medication management with [facility] is scheduled for 08/30/23 at 5pm-appt will be virtual-pt agreeable to virtual appt to assist with management of her anxiety]       Follow up plan: Follow up call scheduled for 08/29/23    Encounter Outcome:  Patient Visit Completed

## 2023-08-27 NOTE — Patient Instructions (Signed)
Visit Information  Thank you for taking time to visit with me today. Please don't hesitate to contact me if I can be of assistance to you.   Following are the goals we discussed today:   Goals Addressed             This Visit's Progress    Mental Health follow up       Activities and task to complete in order to accomplish goals.   EMOTIONAL / MENTAL HEALTH SUPPORT Patient to follow up with [facility] on 10/23524 at 5pm( appt will be virtual ) Continue to keep all upcoming appointments discussed today Self Support options  (continue to discuss the arrangement of payment plans to address financial stressors, continue to reach out to family and friends for support when needed)           Our next appointment is by telephone on 08/29/23 at 2pm  Please call the care guide team at [phone] if you need to cancel or reschedule your appointment.   If you are experiencing a Mental Health or Behavioral Health Crisis or need someone to talk to, please call the Suicide and Crisis Lifeline: 988   Patient verbalizes understanding of instructions and care plan provided today and agrees to view in MyChart. Active MyChart status and patient understanding of how to access instructions and care plan via MyChart confirmed with patient.     Telephone follow up appointment with care management team member scheduled for:08/29/23   Melanie Kratzke, LCSW [place]  [facility], [facility] Licensed Clinical Social Worker Care Coordinator  Direct Dial: [phone]

## 2023-08-28 ENCOUNTER — Other Ambulatory Visit: Payer: Self-pay | Admitting: Family Medicine

## 2023-08-28 ENCOUNTER — Telehealth: Payer: Self-pay | Admitting: Family Medicine

## 2023-08-28 MED ORDER — MIRTAZAPINE 7.5 MG PO TABS: 7.5000 mg | ORAL_TABLET | Freq: Every day | ORAL | 0 refills | Status: DC

## 2023-08-28 MED ORDER — MIRTAZAPINE 7.5 MG PO TABS: 15.0000 mg | ORAL_TABLET | Freq: Every day | ORAL | 1 refills | Status: DC

## 2023-08-28 NOTE — Telephone Encounter (Signed)
Patient just called and said the medication she is taking mirtazapine (REMERON) 7.5 MG tablet. She takes 2 pills a day. She wanted to see if Dr. Birdie Sons can write on the label when she gets her refill 2 tablets by mouth at bedtime instead of 1 tablet. She said Dr. Birdie Sons said it was okay for her to take 2. She's running out of her medication. The pharmacy she use is [facility] [address], [place] - [address] [place] [address] Phone: [phone]  Fax: [phone]  Her number is [phone]

## 2023-08-28 NOTE — Telephone Encounter (Signed)
Noted. Please call the patient and advise her that she needs to call [facility] to get her appointment scheduled.

## 2023-08-28 NOTE — Addendum Note (Signed)
Addended by: Birdie Sons, Chantel Teti G on: 08/28/2023 11:00 AM   Modules accepted: Orders

## 2023-08-28 NOTE — Telephone Encounter (Signed)
Patient states she needs a refill on Mirtazipine and that Crystal set her up with a virtual visit for Friday for a psychiatrist but she is not sure where.

## 2023-08-28 NOTE — Telephone Encounter (Signed)
Noted. Sent to pharmacy.  

## 2023-08-28 NOTE — Telephone Encounter (Signed)
Updated prescription sent to pharmacy.

## 2023-08-28 NOTE — Telephone Encounter (Signed)
Let Patient know that Dr. Birdie Sons sent in an updated prescription and gave her the phone number to [facility] to schedule an appointment.

## 2023-08-29 ENCOUNTER — Ambulatory Visit: Payer: Self-pay | Admitting: *Deleted

## 2023-08-29 NOTE — Patient Outreach (Addendum)
Care Coordination   Follow Up Visit Note   08/29/2023 Name: Melanie Ray MRN: [id number] DOB: 1950-11-06  [name] is a 72 y.o. year old female who sees Birdie Sons, Yehuda Mao, MD for primary care. I spoke with  Melanie Ray by phone today.  What matters to the patients health and wellness today? Stress management, mental health follow up    Goals Addressed             This Visit's Progress    Mental Health follow up       Activities and task to complete in order to accomplish goals.   EMOTIONAL / MENTAL HEALTH SUPPORT Patient to follow up with [facility] on 08/30/23 at 5pm( appt will be virtual ) Continue to keep all upcoming appointments discussed today Self Support options  (continue to discuss the arrangement of payment plans to address financial stressors, continue to reach out to family and friends for support when needed, continue to work with nursing facility to coordinate plan for spouse's ongoing placement and care needs.           SDOH assessments and interventions completed:  No     Care Coordination Interventions:  Yes, provided  Interventions Today    Flowsheet Row Most Recent Value  Chronic Disease   Chronic disease during today's visit Other  [depression, anxiety]  General Interventions   General Interventions Discussed/Reviewed General Interventions Reviewed, [facility], Communication with  Doctor Visits Discussed/Reviewed Doctor Visits Discussed  Communication with PCP/Specialists  [facility] confirmed patient's appt with psychiatrist on 08/30/23-appt will be virtual]  Mental Health Interventions   Mental Health Discussed/Reviewed Mental Health Reviewed, Coping Strategies, Anxiety  [supportive counseling provided due to increased stress related to spouse's care needs. Emotional support provided, verbalization of feelings encouraged, self care emphasized coping strategies discussed to manage stress-]  Safety  Interventions   Safety Discussed/Reviewed Safety Discussed  [pt confirmed thoughts of suicide related to the stress of managing spouse's placement needs. Pt denied plan stating that "I am just feling overwhelmed, I would never hurt myself" encouraged patient to contact 988 with thoughts of harm to self or others]       Follow up plan: Follow up call scheduled for 09/02/23    Encounter Outcome:  Patient Visit Completed

## 2023-08-29 NOTE — Patient Instructions (Signed)
Visit Information  Thank you for taking time to visit with me today. Please don't hesitate to contact me if I can be of assistance to you.   Following are the goals we discussed today:   Goals Addressed             This Visit's Progress    Mental Health follow up       Activities and task to complete in order to accomplish goals.   EMOTIONAL / MENTAL HEALTH SUPPORT Patient to follow up with [facility] on 08/30/23 at 5pm( appt will be virtual ) Continue to keep all upcoming appointments discussed today Self Support options  (continue to discuss the arrangement of payment plans to address financial stressors, continue to reach out to family and friends for support when needed, continue to work with nursing facility to coordinate plan for spouse's ongoing placement and care needs.           Our next appointment is by telephone on 09/02/23 at 3pm  Please call the care guide team at [phone] if you need to cancel or reschedule your appointment.   If you are experiencing a Mental Health or Behavioral Health Crisis or need someone to talk to, please call the Suicide and Crisis Lifeline: 988   Patient verbalizes understanding of instructions and care plan provided today and agrees to view in MyChart. Active MyChart status and patient understanding of how to access instructions and care plan via MyChart confirmed with patient.     Telephone follow up appointment with care management team member scheduled for: 09/02/23  Verna Czech, LCSW [place]  [facility], [facility] Licensed Clinical Social Worker Care Coordinator  Direct Dial: [phone]

## 2023-08-30 DIAGNOSIS — F413 Other mixed anxiety disorders: Secondary | ICD-10-CM | POA: Diagnosis not present

## 2023-08-30 DIAGNOSIS — Z23 Encounter for immunization: Secondary | ICD-10-CM | POA: Diagnosis not present

## 2023-08-30 DIAGNOSIS — F251 Schizoaffective disorder, depressive type: Secondary | ICD-10-CM | POA: Diagnosis not present

## 2023-09-02 ENCOUNTER — Ambulatory Visit: Payer: Self-pay | Admitting: *Deleted

## 2023-09-02 NOTE — Patient Instructions (Signed)
Visit Information  Thank you for taking time to visit with me today. Please don't hesitate to contact me if I can be of assistance to you.   Following are the goals we discussed today:   Goals Addressed             This Visit's Progress    Mental Health follow up       Activities and task to complete in order to accomplish goals.   EMOTIONAL / MENTAL HEALTH SUPPORT Patient to  continue to follow up with [facility] for medication management Continue to keep all upcoming appointments discussed today Self Support options  (continue to discuss the arrangement of payment plans to address financial stressors, continue to reach out to family and friends for support when needed, continue to work with nursing facility to coordinate plan for spouse's ongoing placement and care needs.           Our next appointment is by telephone on 09/16/23 at 11am  Please call the care guide team at [phone] if you need to cancel or reschedule your appointment.   If you are experiencing a Mental Health or Behavioral Health Crisis or need someone to talk to, please call 911   Patient verbalizes understanding of instructions and care plan provided today and agrees to view in MyChart. Active MyChart status and patient understanding of how to access instructions and care plan via MyChart confirmed with patient.     Telephone follow up appointment with care management team member scheduled for: 09/16/23  Verna Czech, LCSW [place]  [facility], [facility] Licensed Clinical Social Worker Care Coordinator  Direct Dial: [phone]

## 2023-09-02 NOTE — Patient Outreach (Addendum)
Care Coordination   Follow Up Visit Note   09/02/2023 Name: Melanie Ray MRN: [id number] DOB: [birth year]  Azori Konopasek is a 72 y.o. year old female who sees Birdie Sons, Yehuda Mao, MD for primary care. I spoke with  [name] by phone today.  What matters to the patients health and wellness today?  Mental Health follow up to manage stress and anxiety related to financial stressors   Goals Addressed             This Visit's Progress    Mental Health follow up       Activities and task to complete in order to accomplish goals.   EMOTIONAL / MENTAL HEALTH SUPPORT Patient to  continue to follow up with [facility] for medication management Continue to keep all upcoming appointments discussed today Self Support options  (continue to discuss the arrangement of payment plans to address financial stressors, continue to reach out to family and friends for support when needed, continue to work with nursing facility to coordinate plan for spouse's ongoing placement and care needs.           SDOH assessments and interventions completed:  No     Care Coordination Interventions:  Yes, provided  Interventions Today    Flowsheet Row Most Recent Value  Chronic Disease   Chronic disease during today's visit Other  [anxiety and stress]  General Interventions   General Interventions Discussed/Reviewed General Interventions Reviewed, [facility], Doctor Visits  [confirmed that spouse will remain in the facility an addiitonal month, will allow pt time to develop a long term plan for her spouse as well as manage finances accordingly]  Doctor Visits Discussed/Reviewed Doctor Visits Discussed  [virtual appointment completed with Psychiatrist with [facility] on 10/25]  Mental Health Interventions   Mental Health Discussed/Reviewed Mental Health Reviewed, Coping Strategies, Anxiety  [encouraged continued follow up with [facility] for medication management,  emotional support continues to be provided, use of postive coping strategies continues to be encouraged]       Follow up plan: Follow up call scheduled for 09/16/23    Encounter Outcome:  Patient Visit Completed

## 2023-09-05 DIAGNOSIS — H43813 Vitreous degeneration, bilateral: Secondary | ICD-10-CM | POA: Diagnosis not present

## 2023-09-09 ENCOUNTER — Other Ambulatory Visit: Payer: Self-pay | Admitting: Cardiology

## 2023-09-10 DIAGNOSIS — Z23 Encounter for immunization: Secondary | ICD-10-CM | POA: Diagnosis not present

## 2023-09-10 NOTE — Telephone Encounter (Signed)
This is a Educational psychologist patient

## 2023-09-10 NOTE — Progress Notes (Deleted)
HEART AND VASCULAR CENTER   MULTIDISCIPLINARY HEART VALVE TEAM  Structural Heart Office Note:  .    Date:  09/10/2023  ID:  Melanie Ray, DOB 12/31/1950, MRN [id number] PCP: Glori Luis, MD  [facility] Providers Cardiologist:  Tonny Bollman, MD  History of Present Illness: Melanie Ray is a 72 y.o. female  with a hx of CAD s/p anterior STEMI 5/17 with PCI/DES x 2 to LAD, HFrEF/ischemic cardiomyopathy with LVEF at 20-25%, DM2, HTN, HLD, LBBB with CRT-D placement 01/02/22, OSA, and severe mitral regurgitation who underwent TEER with MitraClip 10/04/22.    Melanie Ray is followed by Dr. Gala Romney for her cardiology care. She has a long hx of ischemic cardiomyopathy not responsive to CRT therapy. She underwent an echocardiogram 07/17/2022 showing an LVEF at 20 to 25%, normal RV function, and severe central mitral regurgitation. Subsequent LHC and TEE demonstrated continued patency of her LAD stents with residual nonobstructive disease. She was noted to have elevated diastolic filling pressures with prominent V waves of 37 mmHg consistent with hemodynamically significant mitral regurgitation. TEE demonstrated severe LV dysfunction with an LVEF of 20 to 25%, normal RV size with moderately reduced RV function, severe left atrial enlargement, and moderately severe mitral regurgitation with annular dilatation and restriction of the posterior leaflet. The mitral regurgitation is graded at 3+.   She was referred to the Structural Heart team and was seen by Dr. Excell Seltzer 08/08/22 with symptoms of persistent SOB, NYHA Class II symptoms. Plan was to proceed with TEER scheduled for 10/04/22. Pre-op labs showed significantly elevated BNP however appeared euvolemic on pre procedure exam.    Additionally she was evaluated by the multidisciplinary valve team and felt to have severe, symptomatic mitral regurgitation and to be a suitable candidate.    She underwent successful  transcatheter edge-to-edge mitral valve repair with MitraClip NTW x1 and NT x1 reducing baseline 4+ mitral regurgitation to trace, clips positioned A2/P2. Post op echocardiogram unfortunately showed moderate to severe MR felt secondary to SLDA of the second MitraClip placement. Mean gradient at with an average HR at 83bpm.    Today she presents alone and reports that she has been having more SOB since her discharge. She states SOB is mostly when she is ambulating around the house. She will at times need to stop and rest to catch her breath. She does not feel that she is overtly volume overload although she has had some upper extremity/hand edema and intermittent ankle edema. She reports that she has not been short of breath for several months. Her pre TEER labs showed elevated BNP and she was treated with IV Lasix during her hospitalization. She is only on Lasix 20mg  every other day outpatient. She denies chest pain, palpitations, dizziness, bleeding, orthopnea, or syncope.    Discharged s/p TEER 12/1 however returned 12/10 to [facility] with symptoms of SOB at which time she was transferred to [facility]. RHC showed well preserved hemodynamics. TCTS was consulted and she ultimately discharged with plans to return for high risk MVR with Dr. Leafy Ro.    On 12/20 she underwent successful MVR with 29mm Mosaic valve with return to the OR for large vessel bleeding into the right inferior pericardial space with subsequent pericardial and pleural effusion drainage and was discharged 10/30/22.   She has done well since that time and follows very closely with AHF team.   She saw Dr. Lalla Brothers 10/9 given history of device detected AF however AF burden  was noted to be <1%. She had atrial appendage ligation during MVR. CT scan obtained to evaluate appendage patency that has been ordered however has not yet been scheduled   Chronic systolic HF:   LBBB:   Atrial fibrillation/flutter: detected on device <1%  Physical  Exam:   VS:  There were no vitals taken for this visit.   Wt Readings from Last 3 Encounters:  08/14/23 138 lb 8 oz (62.8 kg)  08/14/23 137 lb 12.8 oz (62.5 kg)  08/08/23 135 lb (61.2 kg)     General: Well developed, well nourished, NAD Skin: Warm, dry, intact  Head: Normocephalic, atraumatic, sclera non-icteric, no xanthomas, clear, moist mucus membranes. Neck: Negative for carotid bruits. No JVD Lungs:Clear to ausculation bilaterally. No wheezes, rales, or rhonchi. Breathing is unlabored. Cardiovascular: RRR with S1 S2. No murmurs, rubs, gallops, or LV heave appreciated. Abdomen: Soft, non-tender, non-distended with normoactive bowel sounds. No hepatomegaly, No rebound/guarding. No obvious abdominal masses. MSK: Strength and tone appear normal for age. 5/5 in all extremities Extremities: No edema. No clubbing or cyanosis. DP/PT pulses 2+ bilaterally Neuro: Alert and oriented. No focal deficits. No facial asymmetry. MAE spontaneously. Psych: Responds to questions appropriately with normal affect.     Risk Assessment/Calculations:   {Does this patient have ATRIAL FIBRILLATION?:[phone]} No BP recorded.  {Refresh Note OR Click here to enter BP  :1}***               ASSESSMENT AND PLAN: .   ***    {Are you ordering a CV Procedure (e.g. stress test, cath, DCCV, TEE, etc)?   Press F2        :[id number]}  Dispo: ***  Signed, Georgie Chard, NP

## 2023-09-10 NOTE — Telephone Encounter (Signed)
Please advise if ok to refill medication last filled by  Date: 11/08/2022 Department: [facility] Ordering/Authorizing: Andrey Farmer, PA-C

## 2023-09-11 ENCOUNTER — Ambulatory Visit: Payer: Medicare Other

## 2023-09-11 ENCOUNTER — Ambulatory Visit (HOSPITAL_COMMUNITY): Payer: Medicare Other

## 2023-09-11 ENCOUNTER — Telehealth: Payer: Self-pay

## 2023-09-11 DIAGNOSIS — I340 Nonrheumatic mitral (valve) insufficiency: Secondary | ICD-10-CM

## 2023-09-11 DIAGNOSIS — Z9889 Other specified postprocedural states: Secondary | ICD-10-CM

## 2023-09-11 NOTE — Addendum Note (Signed)
Addended by: Gunnar Fusi A on: 09/11/2023 12:11 PM   Modules accepted: Orders

## 2023-09-11 NOTE — Telephone Encounter (Signed)
The patient cancelled her 1 year Clip visits today (MitraClip procedure date: 10/04/2022).   Called the patient. She reports she cancelled the appointment because she has a cold. Rescheduled the patient for 1 year Clip visits 10/16/2023. Reiterated to her the importance of keeping those visits if at all possible. She was grateful for call and agreed with plan.

## 2023-09-13 DIAGNOSIS — F413 Other mixed anxiety disorders: Secondary | ICD-10-CM | POA: Diagnosis not present

## 2023-09-13 DIAGNOSIS — F251 Schizoaffective disorder, depressive type: Secondary | ICD-10-CM | POA: Diagnosis not present

## 2023-09-16 ENCOUNTER — Ambulatory Visit: Payer: Self-pay | Admitting: *Deleted

## 2023-09-16 NOTE — Patient Instructions (Signed)
Visit Information  Thank you for taking time to visit with me today. Please don't hesitate to contact me if I can be of assistance to you.   Following are the goals we discussed today:   Goals Addressed             This Visit's Progress    Mental Health follow up       Activities and task to complete in order to accomplish goals.   EMOTIONAL / MENTAL HEALTH SUPPORT Patient to  continue to follow up with [facility] for medication management Continue to keep all upcoming appointments discussed today Self Support options  (continue to discuss the arrangement of payment plans to address financial stressors, continue to reach out to family and friends for support when needed, continue to work with nursing facility to coordinate plan for spouse's ongoing placement and care needs, continue to prepare for spouse's discharge home by continued coordination with the [facility] and private duty aids           If you are experiencing a Mental Health or Behavioral Health Crisis or need someone to talk to, please call the Suicide and Crisis Lifeline: 988   Patient verbalizes understanding of instructions and care plan provided today and agrees to view in [place]. Active MyChart status and patient understanding of how to access instructions and care plan via MyChart confirmed with patient.     No further follow up required: patient to contact this Child psychotherapist with any additional community resource needs  [employer], [employer] [place]  [facility], [facility] Health Licensed Clinical Social Geologist, engineering Dial: [phone]

## 2023-09-16 NOTE — Patient Outreach (Signed)
  Care Coordination   Follow Up Visit Note   09/16/2023 Name: Melanie Ray MRN: [id number] DOB: September 27, 1951  Melanie Ray is a 72 y.o. year old female who sees Birdie Sons, Yehuda Mao, MD for primary care. I spoke with  [name] by phone today.  What matters to the patients health and wellness today?  Mental health follow up-patient now receiving ongoing mental health follow up with [facility]. Patient continues to work with spouse's LTC facility regarding coverage and continued stay. Plans to bring spouse home within the next couple of months. [facility] will assist 11 hours per week, additional care to be supplemented by private duty aids. Patient denies having any additional community resource needs at this time.    Goals Addressed             This Visit's Progress    Mental Health follow up       Activities and task to complete in order to accomplish goals.   EMOTIONAL / MENTAL HEALTH SUPPORT Patient to  continue to follow up with [facility] for medication management Continue to keep all upcoming appointments discussed today Self Support options  (continue to discuss the arrangement of payment plans to address financial stressors, continue to reach out to family and friends for support when needed, continue to work with nursing facility to coordinate plan for spouse's ongoing placement and care needs, continue to prepare for spouse's discharge home by continued coordination with the [facility] and private duty aids           SDOH assessments and interventions completed:  No     Care Coordination Interventions:  Yes, provided  Interventions Today    Flowsheet Row Most Recent Value  Chronic Disease   Chronic disease during today's visit Other  [anxiety and stress]  General Interventions   General Interventions Discussed/Reviewed General Interventions Reviewed, Level of Care  [Needs assessment completed-pt confirms being seen by Psychiatrist on 11/8-meds continue  to be monitored-pt continues to work with spouse's nursing home re: coverage/out of pocket expectations reviewed]  Doctor Visits Discussed/Reviewed Doctor Visits Discussed  [psychiatrist on FRiday, will follow up in 2 weeks]  Level of Care Personal Care Services  [pt considering returning spouse home in a few months, confirmed contacr with private duty care as well as the [place] to coordinate in home care assistance-[facility] to provide 11 hours per week, private duty care to supplement]  Mental Health Interventions   Mental Health Discussed/Reviewed Mental Health Reviewed, Coping Strategies, Anxiety  [emotional support continues to be provided-provided pos reinforcement for coping mechanisms utilized-pt verbalized efforts to accept her current situation,  utilizing support from family and friends as well as available community resources]  Pharmacy Interventions   Pharmacy Dicussed/Reviewed Pharmacy Topics Reviewed  Safety Interventions   Safety Discussed/Reviewed Safety Reviewed       Follow up plan: No further intervention required.   Encounter Outcome:  Patient Visit Completed

## 2023-09-24 ENCOUNTER — Encounter: Payer: Self-pay | Admitting: Family Medicine

## 2023-09-24 ENCOUNTER — Ambulatory Visit (INDEPENDENT_AMBULATORY_CARE_PROVIDER_SITE_OTHER): Payer: Medicare Other | Admitting: Family Medicine

## 2023-09-24 ENCOUNTER — Ambulatory Visit: Payer: Medicare Other

## 2023-09-24 VITALS — BP 118/66 | HR 93 | Temp 98.5°F | Ht 63.0 in | Wt 140.4 lb

## 2023-09-24 DIAGNOSIS — M2011 Hallux valgus (acquired), right foot: Secondary | ICD-10-CM | POA: Diagnosis not present

## 2023-09-24 DIAGNOSIS — M79671 Pain in right foot: Secondary | ICD-10-CM | POA: Diagnosis not present

## 2023-09-24 DIAGNOSIS — M7731 Calcaneal spur, right foot: Secondary | ICD-10-CM | POA: Diagnosis not present

## 2023-09-24 DIAGNOSIS — M25571 Pain in right ankle and joints of right foot: Secondary | ICD-10-CM

## 2023-09-24 DIAGNOSIS — M19071 Primary osteoarthritis, right ankle and foot: Secondary | ICD-10-CM | POA: Diagnosis not present

## 2023-09-24 NOTE — Patient Instructions (Signed)
Please ice your foot and ankle for 10 minutes 3 times per day.  You can take tylenol 1000 mg every 8 hours over the counter for pain.

## 2023-09-24 NOTE — Assessment & Plan Note (Signed)
Patient with right foot and ankle pain.  Discussed potential for tendon or muscular strain.  Also discussed the potential for arthritis.  Given where she is tender we will do an x-ray of her foot and ankle today.  She can add Tylenol 1000 mg every 8 hours as needed for pain.  She will ice for 10 minutes 3 times daily as well.

## 2023-09-24 NOTE — Progress Notes (Signed)
Marikay Alar, MD Phone: [phone]  Melanie Ray is a 72 y.o. female who presents today for same-day visit.  Right ankle and foot pain: Patient notes this started on Friday.  She notes no injury.  Started suddenly.  Bothers her only when she walks.  Notes it hurts from her anterior ankle down to the top of her foot.  Has not tried any medications.  She has not had any swelling.  Social History   Tobacco Use  Smoking Status Former   Current packs/day: 0.00   Average packs/day: 0.5 packs/day for 25.0 years (12.5 ttl pk-yrs)   Types: Cigarettes   Start date: 04/13/1972   Quit date: 04/13/1997   Years since quitting: 26.4  Smokeless Tobacco Never    Current Outpatient Medications on File Prior to Visit  Medication Sig Dispense Refill   aspirin EC 81 MG tablet Take 1 tablet (81 mg total) by mouth daily. Swallow whole. 30 tablet 12   atorvastatin (LIPITOR) 80 MG tablet Take 1 tablet (80 mg total) by mouth daily. 90 tablet 3   carvedilol (COREG) 12.5 MG tablet Take 1 tablet (12.5 mg total) by mouth 2 (two) times daily with a meal. 180 tablet 2   dorzolamide-timolol (COSOPT) 22.3-6.8 MG/ML ophthalmic solution Place 1 drop into both eyes 2 (two) times daily.      furosemide (LASIX) 40 MG tablet Take 40 mg by mouth as needed.     gabapentin (NEURONTIN) 400 MG capsule Take 1 capsule (400 mg total) by mouth 2 (two) times daily. (Patient taking differently: Take 400 mg by mouth 3 (three) times daily.) 60 capsule 3   isosorbide mononitrate (IMDUR) 30 MG 24 hr tablet Take 30 mg by mouth daily.     latanoprost (XALATAN) 0.005 % ophthalmic solution Place 1 drop into both eyes at bedtime.     mirtazapine (REMERON) 7.5 MG tablet Take 2 tablets (15 mg total) by mouth at bedtime. 60 tablet 1   QUEtiapine (SEROQUEL) 50 MG tablet Take 50 mg by mouth at bedtime.     valsartan (DIOVAN) 80 MG tablet Take 1 tablet by mouth twice daily (Patient taking differently: Take 80 mg by mouth daily.) 180  tablet 0   donepezil (ARICEPT) 10 MG tablet Take 10 mg by mouth at bedtime.     No current facility-administered medications on file prior to visit.     ROS see history of present illness  Objective  Physical Exam Vitals:   09/24/23 0950  BP: 118/66  Pulse: 93  Temp: 98.5 F (36.9 C)  SpO2: 98%    BP Readings from Last 3 Encounters:  09/24/23 118/66  08/14/23 (!) 142/70  08/14/23 132/76   Wt Readings from Last 3 Encounters:  09/24/23 140 lb 6.4 oz (63.7 kg)  08/14/23 138 lb 8 oz (62.8 kg)  08/14/23 137 lb 12.8 oz (62.5 kg)    Physical Exam Musculoskeletal:     Comments: Patient with tenderness over her anterior right ankle, also tenderness at the right navicular area and right head of metatarsal, no swelling, no warmth, no erythema, also has tenderness over the dorsum of her right foot      Assessment/Plan: Please see individual problem list.  Right foot pain Assessment & Plan: Patient with right foot and ankle pain.  Discussed potential for tendon or muscular strain.  Also discussed the potential for arthritis.  Given where she is tender we will do an x-ray of her foot and ankle today.  She can add Tylenol  1000 mg every 8 hours as needed for pain.  She will ice for 10 minutes 3 times daily as well.  Orders: -     DG Foot Complete Right; Future  Acute right ankle pain -     DG Ankle Complete Right; Future    Return if symptoms worsen or fail to improve.   Marikay Alar, MD [facility]

## 2023-09-27 ENCOUNTER — Telehealth: Payer: Self-pay | Admitting: Family Medicine

## 2023-09-27 NOTE — Telephone Encounter (Signed)
Pt called wanting to know the x ray results for her foot

## 2023-09-27 NOTE — Telephone Encounter (Signed)
Patient is aware that the xray results is not in yet.

## 2023-09-30 DIAGNOSIS — M25571 Pain in right ankle and joints of right foot: Secondary | ICD-10-CM | POA: Diagnosis not present

## 2023-09-30 DIAGNOSIS — M2141 Flat foot [pes planus] (acquired), right foot: Secondary | ICD-10-CM | POA: Diagnosis not present

## 2023-10-04 ENCOUNTER — Ambulatory Visit (INDEPENDENT_AMBULATORY_CARE_PROVIDER_SITE_OTHER): Payer: Medicare Other

## 2023-10-04 DIAGNOSIS — I447 Left bundle-branch block, unspecified: Secondary | ICD-10-CM | POA: Diagnosis not present

## 2023-10-11 LAB — CUP PACEART REMOTE DEVICE CHECK
Battery Remaining Longevity: 62 mo
Battery Remaining Percentage: 70 %
Battery Voltage: 2.99 V
Brady Statistic AP VP Percent: 93 %
Brady Statistic AP VS Percent: 1 %
Brady Statistic AS VP Percent: 6.1 %
Brady Statistic AS VS Percent: 1 %
Brady Statistic RA Percent Paced: 93 %
Date Time Interrogation Session: 20241203121226
HighPow Impedance: 53 Ohm
HighPow Impedance: 53 Ohm
Implantable Lead Connection Status: 753985
Implantable Lead Connection Status: 753985
Implantable Lead Connection Status: 753985
Implantable Lead Implant Date: 20230228
Implantable Lead Implant Date: 20230228
Implantable Lead Implant Date: 20230228
Implantable Lead Location: 753858
Implantable Lead Location: 753859
Implantable Lead Location: 753860
Implantable Lead Model: 3830
Implantable Pulse Generator Implant Date: 20230228
Lead Channel Impedance Value: 380 Ohm
Lead Channel Impedance Value: 390 Ohm
Lead Channel Impedance Value: 530 Ohm
Lead Channel Pacing Threshold Amplitude: 0.5 V
Lead Channel Pacing Threshold Amplitude: 0.75 V
Lead Channel Pacing Threshold Amplitude: 2.375 V
Lead Channel Pacing Threshold Pulse Width: 0.05 ms
Lead Channel Pacing Threshold Pulse Width: 0.5 ms
Lead Channel Pacing Threshold Pulse Width: 0.5 ms
Lead Channel Sensing Intrinsic Amplitude: 1.9 mV
Lead Channel Sensing Intrinsic Amplitude: 12 mV
Lead Channel Setting Pacing Amplitude: 0.25 V
Lead Channel Setting Pacing Amplitude: 2 V
Lead Channel Setting Pacing Amplitude: 2 V
Lead Channel Setting Pacing Pulse Width: 0.05 ms
Lead Channel Setting Pacing Pulse Width: 0.5 ms
Lead Channel Setting Sensing Sensitivity: 0.5 mV
Pulse Gen Serial Number: 8904264
Zone Setting Status: 755011

## 2023-10-14 ENCOUNTER — Ambulatory Visit: Payer: Medicare Other | Admitting: Family Medicine

## 2023-10-14 ENCOUNTER — Other Ambulatory Visit: Payer: Self-pay | Admitting: *Deleted

## 2023-10-14 DIAGNOSIS — M8580 Other specified disorders of bone density and structure, unspecified site: Secondary | ICD-10-CM

## 2023-10-14 DIAGNOSIS — Z13820 Encounter for screening for osteoporosis: Secondary | ICD-10-CM

## 2023-10-14 NOTE — Progress Notes (Unsigned)
[facility]   MULTIDISCIPLINARY HEART VALVE CLINIC                                     Cardiology Office Note:    Date:  10/14/2023   ID:  Melanie Ray, DOB 12/12/1950, MRN [id number]  PCP:  Melanie Luis, MD  [facility] Cardiologist:  Melanie Bollman, MD  [facility] Electrophysiologist:  Melanie Prude, MD   Referring MD: Melanie Luis, MD   1 year s/p mTEER- c/b SLDA and surgical replacement.   History of Present Illness:    Melanie Ray is a 72 y.o. female with a hx of CAD s/p anterior STEMI 03/2016 with PCI/DES x 2 to LAD, HFrEF/ischemic cardiomyopathy with LVEF at 20-25%, DM2, stroke, HTN, HLD, anxiety, anemia, depression, OCD, LBBB with CRT-D placement 01/02/22, OSA intolerant to CPAP, and severe mitral regurgitation s/p mTEER with MitraClip 10/04/22 who presents to clinic for follow up.    She underwent successful transcatheter edge-to-edge mitral valve repair 10/04/22 with MitraClip NTW x1 and NT x1 reducing baseline 4+ mitral regurgitation to trace. However. post op echocardiogram 10/05/22 showed EF 25% with moderate to severe MR felt secondary to SLDA of the second MitraClip placement. Mean gradient at with an average HR at 83bpm. RV normal    TCTS consulted and felt to be a candidate for MVR. Patient underwent MVR with 29 mm Mosaic valve by Melanie Ray on 10/24/22. She returned to the OR shortly after for control of large vessel bleed. Moderate pericardial and pleural effusions were evacuated. She did well post-operatively and discharged 10/30/22 after diuresis and titration of GDMT.    Follow up 1/24, anxious with worse NYHA III symptoms. Volume OK. Echo showed mitral valve prosthesis functioning normally, EF 20-25%, grade I DD, RV ok.   Echo 02/25/23: EF 30-35% along with mild LVH, mild/ moderate AR and normal function of mitral valve prosthesis.   Recently seen by Melanie Ray in October 2024 and noted to have PAF on her  device. She had LAA ligated at the time of MVR. Plan to assess appendage by CT and only start OAC if there is evidence of communication.    She presents today for follow up.   Past Medical History:  Diagnosis Date   ABLA (acute blood loss anemia) 10/24/2022   AICD (automatic cardioverter/defibrillator) present    Allergic rhinitis 09/25/2022   Back pain    Benign neoplasm of colon 04/27/2008   CAD (coronary artery disease) 04/11/2016   S/p ant STEMI 5/17: LHC >> LAD proximal 80%, mid 80%, distal 50%, ostial D1 60%; LCx with LPDA lesion 30%; RCA Mild calcification with no significant stenosis in a medium caliber, nondominant RCA; LVEF is estimated at 45% with inferoapical and lateral wall akinesis >> PCI: PCI: 3.5 x 24 mm Promus DES to prox LAD, 2.5 x 12 mm Promus DES to mid LAD.   Cerebellar stroke 03/30/2020   2021 MRI - Small, old left cerebellar infarct    Chest pain    Chronic HFrEF (heart failure with reduced ejection fraction) 10/25/2022   Chronic systolic CHF (congestive heart failure) 03/21/2016   Echo 01/30/17: Diff HK, mild focal basal septal hypertrophy, EF 30-35, mild AI, MAC, mild MR // Echo 06/08/16: Mild focal basal septal hypertrophy, EF 25-30%, diff HK, ant-septal AK, Gr 1 DD, mild AI, MAC, mild MR, PASP 37 mmHg //  Echo 03/18/16: EF 30-35%, ant-septal AK, Gr 1 DD, mild MR, severe LAE.     Constipation    Diarrhea 01/02/2023   Dizziness    Dyspnea    Endotracheal tube present 10/24/2022   Essential hypertension 04/27/2008   Well-controlled today.  She will continue valsartan 80 mg twice a day, Imdur 30 mg daily, Lasix 20 mg every other day, [name] 10 mg daily, and carvedilol 12.5 mg twice daily.   Finger fracture 08/15/2021   She reports this is well-healing.  She will continue to see orthopedics.   Generalized anxiety disorder 04/27/2008   Glaucoma    Gout    History of acute anterior wall MI 03/17/2016   History of colonoscopy 04/27/2008   History of COVID-19  06/27/2022   HLD (hyperlipidemia) 07/28/2015   Check lipid panel.  She will continue Lipitor.   Hot flash, menopausal 02/07/2018   Will check with our clinical pharmacist regarding the black cohosh and primrose in this patient.   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   Iron deficiency anemia, unspecified 04/27/2008   Ischemic cardiomyopathy 10/02/2016   Joint pain    Left bundle branch block 12/03/2018   Chronic.  She will follow with cardiology.   Major depressive disorder 07/28/2015   Chronic ongoing issue.  Has worsened somewhat recently.  Notes passive SI.  Advised if she develops intent or plan to harm herself she needs to go to the emergency room.  I advised her to contact her psychiatrist to arrange for follow-up and she was willing to do so.  She does have protective factors in place including her husband, dog, and nephew.   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  She will follow with cardiology.  We will send her EKG to her psychiatrist and have CMA contact the psychiatry office to inform them that the EKG was faxed and of the results.   Rash 02/07/2018   Possibly related to folliculitis or some other undetermined cause of her rash.  She will trial over-the-counter antibiotic ointment and if not beneficial she will let us know and we can refer her to dermatology.   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  She will continue [name] 10 mg daily.   Uterine leiomyoma 04/27/2008   Vitamin D deficiency 09/15/2010     Current Medications: No outpatient medications have been marked as taking for the 10/16/23 encounter (Office Visit) with [facility].      ROS:   Please see the history of present illness.    All other systems reviewed and are negative.  EKGs       Risk Assessment/Calculations:    CHA2DS2-VASc Score = 5  {Click here to calculate score.  REFRESH note before signing. :1} This indicates a 7.2%  annual risk of stroke. The patient's score is based upon: CHF History: 1 HTN History: 1 Diabetes History: 0 Stroke History: 0 Vascular Disease History: 1 Age Score: 1 Gender Score: 1   {This patient has a significant risk of stroke if diagnosed with atrial fibrillation.  Please consider VKA or DOAC agent for anticoagulation if the bleeding risk is acceptable.   You can also use the SmartPhrase .[email or web address] for documentation.   :[id number]}        Physical Exam:    VS:  There were no vitals taken for this visit.    Wt Readings from Last 3 Encounters:  09/24/23 140 lb 6.4 oz (63.7 kg)  08/14/23 138 lb 8 oz (62.8 kg)  08/14/23 137 lb 12.8 oz (62.5 kg)     GEN: Well nourished, well developed in no acute distress NECK: No JVD CARDIAC: ***RRR, no murmurs, rubs, gallops RESPIRATORY:  Clear to auscultation without rales, wheezing or rhonchi  ABDOMEN: Soft, non-tender, non-distended EXTREMITIES:  No edema; No deformity.  Groin sites clear without hematoma or ecchymosis. ****  ASSESSMENT:    1. S/P mitral valve replacement   2. Chronic systolic heart failure (HCC)   3. Coronary artery disease involving native coronary artery of native heart without angina pectoris   4. LBBB (left bundle branch  block)   5. Essential hypertension   6. OSA (obstructive sleep apnea)   7. PAF (paroxysmal atrial fibrillation) (HCC)     PLAN:    In order of problems listed above:  Severe MR mTEER: s/p mTEER with 2 clips on 10/04/22. Post op echo EF 25% with moderate to severe MR felt secondary to SLDA of the second MitraClip placement. She ultimately required surgical mitral valve replacement. Continue on aspirin 81mg  daily.   Chronic systolic HF: NYHA II. Continued GDMT under the care of the advanced CHF team. Continue Coreg 6.25 mg bid, valsartan 80 mg daily (had SOB w/ entresto), and  Lasix/potassium PRN.    Coronary artery disease involving native coronary artery of native heart without angina pectoris: hx of anterior STEMI in 5/17 tx with DES x 2 to the LAD. No LGE on cMRI (1/23). LHC 9/23 patent LAD stents. No chest pain. Continue ASA + statin   LBBB:  s/p CRT-D 3/23 with Melanie Ray. EF improved to ***. Followed by Melanie Ray   HTN:   OSA: unable to tolerate CPAP  PAF: recently noted to have PAF on device. Had LAA ligated at the time of MVR. Plan to assess appendage by CT and only start OAC if there is evidence of communication. This has been set up for 12/19.      I spent *** minutes caring for this patient today including face-to-face discussions, reviewing labs, reviewing records from [facility] and other outside facilities, documenting in the record, and arranging for follow up.     Medication Adjustments/Labs and Tests Ordered: Current medicines are reviewed at length with the patient today.  Concerns regarding medicines are outlined above.  No orders of the defined types were placed in this encounter.  No orders of the defined types were placed in this encounter.   There are no Patient Instructions on file for this visit.   Signed, Cline Crock, PA-C  10/14/2023 12:24 PM    [facility]

## 2023-10-16 ENCOUNTER — Other Ambulatory Visit: Payer: Self-pay | Admitting: *Deleted

## 2023-10-16 ENCOUNTER — Ambulatory Visit (HOSPITAL_BASED_OUTPATIENT_CLINIC_OR_DEPARTMENT_OTHER): Payer: Medicare Other

## 2023-10-16 ENCOUNTER — Ambulatory Visit (HOSPITAL_COMMUNITY): Payer: Medicare Other | Attending: Cardiology | Admitting: Physician Assistant

## 2023-10-16 VITALS — BP 130/70 | HR 70 | Ht 63.0 in | Wt 145.0 lb

## 2023-10-16 DIAGNOSIS — Z9889 Other specified postprocedural states: Secondary | ICD-10-CM | POA: Diagnosis not present

## 2023-10-16 DIAGNOSIS — I340 Nonrheumatic mitral (valve) insufficiency: Secondary | ICD-10-CM

## 2023-10-16 DIAGNOSIS — Z952 Presence of prosthetic heart valve: Secondary | ICD-10-CM | POA: Insufficient documentation

## 2023-10-16 DIAGNOSIS — Z95818 Presence of other cardiac implants and grafts: Secondary | ICD-10-CM | POA: Diagnosis not present

## 2023-10-16 DIAGNOSIS — I10 Essential (primary) hypertension: Secondary | ICD-10-CM | POA: Insufficient documentation

## 2023-10-16 DIAGNOSIS — G4733 Obstructive sleep apnea (adult) (pediatric): Secondary | ICD-10-CM | POA: Diagnosis not present

## 2023-10-16 DIAGNOSIS — I447 Left bundle-branch block, unspecified: Secondary | ICD-10-CM | POA: Diagnosis not present

## 2023-10-16 DIAGNOSIS — I480 Paroxysmal atrial fibrillation: Secondary | ICD-10-CM | POA: Diagnosis not present

## 2023-10-16 DIAGNOSIS — I5022 Chronic systolic (congestive) heart failure: Secondary | ICD-10-CM | POA: Diagnosis not present

## 2023-10-16 DIAGNOSIS — I2510 Atherosclerotic heart disease of native coronary artery without angina pectoris: Secondary | ICD-10-CM | POA: Insufficient documentation

## 2023-10-16 LAB — ECHOCARDIOGRAM COMPLETE
Area-P 1/2: 4.31 cm2
Height: 63 in
MV VTI: 0.83 cm2
P 1/2 time: 216 ms
S' Lateral: 4.5 cm
Weight: 2320 [oz_av]

## 2023-10-16 NOTE — Patient Instructions (Signed)
Medication Instructions:   Your physician recommends that you continue on your current medications as directed. Please refer to the Current Medication list given to you today.  *If you need a refill on your cardiac medications before your next appointment, please call your pharmacy*    Testing/Procedures:  As scheduled   Follow-Up:  As scheduled

## 2023-10-17 ENCOUNTER — Other Ambulatory Visit: Payer: Medicare Other

## 2023-10-18 DIAGNOSIS — M7671 Peroneal tendinitis, right leg: Secondary | ICD-10-CM | POA: Diagnosis not present

## 2023-10-18 DIAGNOSIS — M79671 Pain in right foot: Secondary | ICD-10-CM | POA: Diagnosis not present

## 2023-10-18 DIAGNOSIS — M65979 Unspecified synovitis and tenosynovitis, unspecified ankle and foot: Secondary | ICD-10-CM | POA: Diagnosis not present

## 2023-10-18 DIAGNOSIS — M25571 Pain in right ankle and joints of right foot: Secondary | ICD-10-CM | POA: Diagnosis not present

## 2023-10-23 ENCOUNTER — Telehealth (HOSPITAL_COMMUNITY): Payer: Self-pay | Admitting: *Deleted

## 2023-10-23 NOTE — Telephone Encounter (Signed)
Reaching out to patient to offer assistance regarding upcoming cardiac imaging study; pt verbalizes understanding of appt date/time, parking situation and where to check in, pre-test NPO status and verified current allergies; name and call back number provided for further questions should they arise  Larey Brick RN Navigator Cardiac Imaging [facility] [phone] office [phone] cell  Patient aware to arrive at 3 PM.

## 2023-10-23 NOTE — Telephone Encounter (Signed)
Attempted to call patient regarding upcoming cardiac CT appointment. °Left message on voicemail with name and callback number ° °Edie Vallandingham RN Navigator Cardiac Imaging °[facility] °[phone] Office °[phone] Cell ° °

## 2023-10-24 ENCOUNTER — Ambulatory Visit (HOSPITAL_COMMUNITY)
Admission: RE | Admit: 2023-10-24 | Discharge: 2023-10-24 | Disposition: A | Discharge status: Home / Self Care | Payer: Medicare Other | Source: Ambulatory Visit | Attending: Cardiology | Admitting: Cardiology

## 2023-10-24 DIAGNOSIS — I5022 Chronic systolic (congestive) heart failure: Secondary | ICD-10-CM | POA: Insufficient documentation

## 2023-10-24 DIAGNOSIS — I447 Left bundle-branch block, unspecified: Secondary | ICD-10-CM | POA: Insufficient documentation

## 2023-10-24 DIAGNOSIS — I2510 Atherosclerotic heart disease of native coronary artery without angina pectoris: Secondary | ICD-10-CM | POA: Diagnosis not present

## 2023-10-24 DIAGNOSIS — Z95810 Presence of automatic (implantable) cardiac defibrillator: Secondary | ICD-10-CM | POA: Insufficient documentation

## 2023-10-24 DIAGNOSIS — I480 Paroxysmal atrial fibrillation: Secondary | ICD-10-CM | POA: Diagnosis not present

## 2023-10-24 MED ORDER — IOHEXOL 350 MG/ML SOLN
95.0000 mL | Freq: Once | INTRAVENOUS | Status: AC | PRN
  Administered 2023-10-24: 95 mL via INTRAVENOUS

## 2023-11-11 ENCOUNTER — Ambulatory Visit: Payer: Medicare Other | Admitting: Internal Medicine

## 2023-11-13 ENCOUNTER — Encounter: Payer: Self-pay | Admitting: Internal Medicine

## 2023-11-13 ENCOUNTER — Ambulatory Visit: Payer: Medicare Other | Admitting: Internal Medicine

## 2023-11-13 VITALS — BP 136/82 | HR 87 | Temp 97.2°F | Ht 63.0 in | Wt 145.0 lb

## 2023-11-13 DIAGNOSIS — F411 Generalized anxiety disorder: Secondary | ICD-10-CM

## 2023-11-13 DIAGNOSIS — J309 Allergic rhinitis, unspecified: Secondary | ICD-10-CM | POA: Diagnosis not present

## 2023-11-13 DIAGNOSIS — F331 Major depressive disorder, recurrent, moderate: Secondary | ICD-10-CM | POA: Diagnosis not present

## 2023-11-13 MED ORDER — FLUTICASONE PROPIONATE 50 MCG/ACT NA SUSP: 1.0000 | Freq: Every day | NASAL | 2 refills | Status: DC

## 2023-11-13 NOTE — Patient Instructions (Addendum)
-  Was a pleasure meeting you today -I hope your husband feels better soon -You have had a runny nose since June of last year.  I believe the symptoms are secondary to allergies. -Please take over-the-counter Claritin or Allegra as needed for your runny nose.  Please avoid Claritin-D or Allegra-D -I will prescribe Flonase  for you.  This is a nasal spray.  Please use 1 spray in each nostril every day -Please follow-up with your psychiatrist for your worsening depression and anxiety.  If you are unable to get into see your psychiatrist please call us  for follow-up -If you have any difficulty getting your medications or if your symptoms worsen please call us

## 2023-11-13 NOTE — Assessment & Plan Note (Signed)
-  Patient states that she has had a runny nose since June of last year -She denies any cough or fevers or chest pain or shortness of breath -I suspect her symptoms are likely secondary to her previous diagnosis of allergic rhinitis -Patient states that she is not taking any medications currently for this -We will start her on Flonase  1 spray into each nostril once a day and Claritin or Allegra daily for now.  If patient symptoms improve on this she will continue with the Flonase  and use Claritin or Allegra only as needed -No further workup at this time

## 2023-11-13 NOTE — Progress Notes (Signed)
 Acute Office Visit  Subjective:     Patient ID: Melanie Ray, female    DOB: 1951/06/20, 73 y.o.   MRN: [id number]  Chief Complaint  Patient presents with   Acute Visit    Runny nose    HPI Patient is in today for worsening mood disorder as well as likely allergic rhinitis.  Patient states that she has had a runny nose since June of last year with no improvement.  She states that she has not been taking any medications for this  Still states that her husband has been ill and has been in and out of the hospital over the last week.  She is unable to take care of him at home and this is increased her stress substantially.  She has complaints of increased fatigue likely secondary to underlying stress   Review of Systems  Constitutional:  Positive for malaise/fatigue.  HENT:  Negative for nosebleeds, sinus pain and tinnitus.        Runny nose  Respiratory: Negative.  Negative for cough, shortness of breath and wheezing.   Neurological: Negative.  Negative for dizziness and headaches.  Psychiatric/Behavioral:  Positive for depression. The patient is nervous/anxious.         Objective:    BP 136/82   Pulse 87   Temp (!) 97.2 F (36.2 C)   Ht 5' 3 (1.6 m)   Wt 145 lb (65.8 kg)   SpO2 99%   BMI 25.69 kg/m    Physical Exam Constitutional:      Appearance: Normal appearance.  HENT:     Head: Normocephalic and atraumatic.     Nose: Congestion and rhinorrhea present.     Mouth/Throat:     Mouth: Mucous membranes are moist.     Pharynx: Oropharynx is clear. No oropharyngeal exudate or posterior oropharyngeal erythema.  Cardiovascular:     Rate and Rhythm: Normal rate and regular rhythm.     Heart sounds: Normal heart sounds.  Pulmonary:     Breath sounds: Normal breath sounds. No wheezing or rales.  Musculoskeletal:     Cervical back: Neck supple.  Lymphadenopathy:     Cervical: No cervical adenopathy.  Neurological:     Mental Status: She is alert.   Psychiatric:     Comments: Patient complaining of increased stress and worsening fatigue.  No thoughts of harming herself    No results found for any visits on 11/13/23.      Assessment & Plan:   Problem List Items Addressed This Visit       Respiratory   Allergic rhinitis - Primary (Chronic)   -Patient states that she has had a runny nose since June of last year -She denies any cough or fevers or chest pain or shortness of breath -I suspect her symptoms are likely secondary to her previous diagnosis of allergic rhinitis -Patient states that she is not taking any medications currently for this -We will start her on Flonase  1 spray into each nostril once a day and Claritin or Allegra daily for now.  If patient symptoms improve on this she will continue with the Flonase  and use Claritin or Allegra only as needed -No further workup at this time      Relevant Medications   fluticasone  (FLONASE ) 50 MCG/ACT nasal spray     Other   Generalized anxiety disorder   -Patient had an elevated GAD-7 score of 18 today -She believes a lot of her symptoms are related to her husband  being ill and having difficulty ambulating and taking care of himself.  She is also unable to care for him physically at home -He is currently in the hospital and has been in and out of the hospital over the last week which has contributed to her stress -Patient states that she does have a psychiatrist that she establish care with and will follow-up with them for further evaluation      Major depressive disorder   -This problem is chronic and worsening -Patient's PHQ-9 score is 13 today.  She also complains of worsening fatigue and increased stress secondary to her husband's ongoing illness.  He has been out of the hospital over the last week and she is unable to take care of him at home. -She is currently on Seroquel  200 mg every night (per last neurology note) as well as Remeron  7.5 mg daily -She will follow-up  with her psychiatrist for her worsening symptoms -I did explain to her that if her fatigue does not improve or if it is worsening to come back and see us  and we will evaluate for further etiologies for her fatigue including worsening anemia and hypothyroidism       Meds ordered this encounter  Medications   fluticasone  (FLONASE ) 50 MCG/ACT nasal spray    Sig: Place 1 spray into both nostrils daily.    Dispense:  15.8 mL    Refill:  2    No follow-ups on file.  Reaghan Kawa, MD

## 2023-11-13 NOTE — Assessment & Plan Note (Signed)
-  This problem is chronic and worsening -Patient's PHQ-9 score is 13 today.  She also complains of worsening fatigue and increased stress secondary to her husband's ongoing illness.  He has been out of the hospital over the last week and she is unable to take care of him at home. -She is currently on Seroquel  200 mg every night (per last neurology note) as well as Remeron  7.5 mg daily -She will follow-up with her psychiatrist for her worsening symptoms -I did explain to her that if her fatigue does not improve or if it is worsening to come back and see us  and we will evaluate for further etiologies for her fatigue including worsening anemia and hypothyroidism

## 2023-11-13 NOTE — Assessment & Plan Note (Signed)
-  Patient had an elevated GAD-7 score of 18 today -She believes a lot of her symptoms are related to her husband being ill and having difficulty ambulating and taking care of himself.  She is also unable to care for him physically at home -He is currently in the hospital and has been in and out of the hospital over the last week which has contributed to her stress -Patient states that she does have a psychiatrist that she establish care with and will follow-up with them for further evaluation

## 2023-11-18 ENCOUNTER — Other Ambulatory Visit: Payer: Self-pay | Admitting: Cardiovascular Disease

## 2023-11-24 ENCOUNTER — Encounter: Payer: Self-pay | Admitting: Cardiology

## 2024-01-03 ENCOUNTER — Ambulatory Visit (INDEPENDENT_AMBULATORY_CARE_PROVIDER_SITE_OTHER): Payer: Medicare Other

## 2024-01-03 DIAGNOSIS — I447 Left bundle-branch block, unspecified: Secondary | ICD-10-CM

## 2024-01-06 LAB — CUP PACEART REMOTE DEVICE CHECK
Battery Remaining Longevity: 61 mo
Battery Remaining Percentage: 67 %
Battery Voltage: 2.98 V
Brady Statistic AP VP Percent: 93 %
Brady Statistic AP VS Percent: 1 %
Brady Statistic AS VP Percent: 6.4 %
Brady Statistic AS VS Percent: 1 %
Brady Statistic RA Percent Paced: 93 %
Date Time Interrogation Session: 20250228020023
HighPow Impedance: 62 Ohm
HighPow Impedance: 62 Ohm
Implantable Lead Connection Status: 753985
Implantable Lead Connection Status: 753985
Implantable Lead Connection Status: 753985
Implantable Lead Implant Date: 20230228
Implantable Lead Implant Date: 20230228
Implantable Lead Implant Date: 20230228
Implantable Lead Location: 753858
Implantable Lead Location: 753859
Implantable Lead Location: 753860
Implantable Lead Model: 3830
Implantable Pulse Generator Implant Date: 20230228
Lead Channel Impedance Value: 430 Ohm
Lead Channel Impedance Value: 440 Ohm
Lead Channel Impedance Value: 550 Ohm
Lead Channel Pacing Threshold Amplitude: 0.5 V
Lead Channel Pacing Threshold Amplitude: 0.75 V
Lead Channel Pacing Threshold Amplitude: 2.625 V
Lead Channel Pacing Threshold Pulse Width: 0.05 ms
Lead Channel Pacing Threshold Pulse Width: 0.5 ms
Lead Channel Pacing Threshold Pulse Width: 0.5 ms
Lead Channel Sensing Intrinsic Amplitude: 12 mV
Lead Channel Sensing Intrinsic Amplitude: 3 mV
Lead Channel Setting Pacing Amplitude: 0.25 V
Lead Channel Setting Pacing Amplitude: 2 V
Lead Channel Setting Pacing Amplitude: 2 V
Lead Channel Setting Pacing Pulse Width: 0.05 ms
Lead Channel Setting Pacing Pulse Width: 0.5 ms
Lead Channel Setting Sensing Sensitivity: 0.5 mV
Pulse Gen Serial Number: 8904264
Zone Setting Status: 755011

## 2024-01-07 ENCOUNTER — Encounter: Payer: Self-pay | Admitting: Cardiology

## 2024-01-16 ENCOUNTER — Other Ambulatory Visit: Payer: Self-pay | Admitting: Family Medicine

## 2024-01-16 NOTE — Telephone Encounter (Signed)
 Copied from CRM [id number]. Topic: Clinical - Medication Refill >> Jan 16, 2024 10:15 AM Drema Balzarine wrote: Most Recent Primary Care Visit:  Provider: Earl Lagos  Department: [facility]  Visit Type: ACUTE  Date: 11/13/2023  Medication: mirtazapine   Has the patient contacted their pharmacy? Yes (Agent: If no, request that the patient contact the pharmacy for the refill. If patient does not wish to contact the pharmacy document the reason why and proceed with request.) (Agent: If yes, when and what did the pharmacy advise?)  Is this the correct pharmacy for this prescription? Yes If no, delete pharmacy and type the correct one.  This is the patient's preferred pharmacy:  [facility] [address], [place] - [address] [place] [address] Phone: [phone] Fax: [phone]   Has the prescription been filled recently? Yes  Is the patient out of the medication? Yes  Has the patient been seen for an appointment in the last year OR does the patient have an upcoming appointment? Yes  Can we respond through MyChart? No  Agent: Please be advised that Rx refills may take up to 3 business days. We ask that you follow-up with your pharmacy.

## 2024-01-20 ENCOUNTER — Other Ambulatory Visit: Payer: Self-pay | Admitting: Family Medicine

## 2024-01-20 ENCOUNTER — Other Ambulatory Visit: Payer: Self-pay

## 2024-01-20 DIAGNOSIS — F39 Unspecified mood [affective] disorder: Secondary | ICD-10-CM

## 2024-01-20 MED ORDER — MIRTAZAPINE 7.5 MG PO TABS: 15.0000 mg | ORAL_TABLET | Freq: Every day | ORAL | 0 refills | Status: DC

## 2024-02-03 NOTE — Progress Notes (Signed)
 Remote ICD transmission.

## 2024-02-03 NOTE — Addendum Note (Signed)
 Addended by: Elease Etienne A on: 02/03/2024 12:06 PM   Modules accepted: Orders

## 2024-02-04 NOTE — Progress Notes (Signed)
 Retrieved form from Dr Earmon Phoenix box titled Chronic Condition Verification form from [place] [facility] asking [name] to verify if currently has the following health condition(s): Chronic heart Failure and Cardiovascular disorders. Completed and faxed in with confirmation received.

## 2024-02-20 ENCOUNTER — Other Ambulatory Visit: Payer: Self-pay

## 2024-02-20 DIAGNOSIS — F39 Unspecified mood [affective] disorder: Secondary | ICD-10-CM

## 2024-02-20 NOTE — Telephone Encounter (Signed)
 Copied from CRM [id number]. Topic: Clinical - Medication Refill >> Feb 20, 2024  9:24 AM Allyne Areola wrote: Most Recent Primary Care Visit:  Provider: Jeffie Mingle  Department: [facility]  Visit Type: ACUTE  Date: 11/13/2023  Medication: mirtazapine (REMERON) 7.5 MG tablet  Has the patient contacted their pharmacy? Yes, informed patient no refills were available  (Agent: If no, request that the patient contact the pharmacy for the refill. If patient does not wish to contact the pharmacy document the reason why and proceed with request.) (Agent: If yes, when and what did the pharmacy advise?)  Is this the correct pharmacy for this prescription? Yes If no, delete pharmacy and type the correct one.  This is the patient's preferred pharmacy:  [facility] [address] [place] - [address] [place] [address] Phone: [phone] Fax: [phone]   Has the prescription been filled recently? No  Is the patient out of the medication? Yes  Has the patient been seen for an appointment in the last year OR does the patient have an upcoming appointment? Yes  Can we respond through MyChart? Yes  Agent: Please be advised that Rx refills may take up to 3 business days. We ask that you follow-up with your pharmacy.

## 2024-02-24 MED ORDER — MIRTAZAPINE 7.5 MG PO TABS: 15.0000 mg | ORAL_TABLET | Freq: Every day | ORAL | 0 refills | Status: DC

## 2024-03-03 ENCOUNTER — Encounter: Payer: Self-pay | Admitting: Psychiatry

## 2024-03-03 ENCOUNTER — Ambulatory Visit (INDEPENDENT_AMBULATORY_CARE_PROVIDER_SITE_OTHER): Admitting: Psychiatry

## 2024-03-03 VITALS — BP 138/78 | HR 83 | Temp 98.2°F | Ht 61.81 in | Wt 146.4 lb

## 2024-03-03 DIAGNOSIS — F411 Generalized anxiety disorder: Secondary | ICD-10-CM

## 2024-03-03 DIAGNOSIS — F422 Mixed obsessional thoughts and acts: Secondary | ICD-10-CM | POA: Diagnosis not present

## 2024-03-03 DIAGNOSIS — F4321 Adjustment disorder with depressed mood: Secondary | ICD-10-CM

## 2024-03-03 MED ORDER — SERTRALINE HCL 25 MG PO TABS: 25.0000 mg | ORAL_TABLET | Freq: Every day | ORAL | 0 refills | Status: DC

## 2024-03-03 MED ORDER — GABAPENTIN 400 MG PO CAPS: 800.0000 mg | ORAL_CAPSULE | Freq: Three times a day (TID) | ORAL | 3 refills | Status: DC

## 2024-03-03 NOTE — Progress Notes (Signed)
 Psychiatric Initial Adult Assessment   Patient Identification: Marcianne Swanick MRN:  [id number] Date of Evaluation:  03/03/2024 Referral Source: Valli Gaw MD Chief Complaint:   Chief Complaint  Patient presents with   Establish Care   Visit Diagnosis:    ICD-10-CM   1. Mixed obsessional thoughts and acts  F42.2 sertraline (ZOLOFT) 25 MG tablet    2. Grieving  F43.21     3. Generalized anxiety disorder  F41.1 gabapentin  (NEURONTIN ) 400 MG capsule     Problems OCD, mixed obsessional thoughts and acts - Recommended for CBT therapy - Start Sertraline 25mg  once daily  Grieving - Recommended for CBT Therapy  Generalized Anxiety Disorder - Continue Gabapentin  800mg  TID.   History of Present Illness: 73 year old female presenting to [facility] for medication management and establishing care.  Patient reports that she is presenting initially for OCD complaints stating that she has obsessive-compulsive disorder since 2011 stating that it has been, overwhelming since the loss of her husband 2 months ago.  Patient reports a significant amount of grieving as well as behaviors that are causing her to feel almost suicidal, denying that she was actually suicidal due to protective factors of her having a little dog as well as knowing that friends and family care for her.  Patient reports that her OCD symptoms involve water, she reports that water touching her seems to be a trigger and states that she has very uncontrollable compulsions of having to change her close if water touches her close, washing her hands frequently, and also changing the bed sheets of her bed if she feels like water has touched her.  Patient also reports that these extreme rituals are causing her significant amount of stress and she feels that there needs to be some relief from always feeling that she has a performer ritual.  Patient states that she is sleeping well from 10 PM to 8 AM.  Patient currently reports taking  medications the gabapentin  of prescribed 400 mg 3 times a day but self increased to 800 mg 3 times a day for anxiety.  Patient is also taking Seroquel 50 mg once a day at night for sleep.  Inpatient was prescribed duloxetine 30 mg but not currently taking.  Based on this assessment interview it is recommended for the patient to be diagnosed with mixed obsessional thoughts and acts of OCD, grieving, and generalized anxiety disorder.  Patient is recommended to start sertraline 25 mg once a day and has been educated on the importance of continuing the medication and not stopping when he felt feeling better.  Patient is also being recommended for therapy in which CBT is recommended for OCD symptoms, therapy list was provided to the patient but patient was also placed on Elizabeth's wait list here at [facility].  Patient is also recommended to continue medications prescribed by previous providers.  Due to the patient's severe OCD symptoms until patient is established in therapy patient will follow up with this provider weekly for medication management, brief psychotherapy and monitoring.  Patient is agreement with treatment plan patient with no other questions or concerns at this time.  Associated Signs/Symptoms: Depression Symptoms:  anhedonia, fatigue, feelings of worthlessness/guilt, difficulty concentrating, hopelessness, loss of energy/fatigue, (Hypo) Manic Symptoms:  Negative Anxiety Symptoms:  Obsessive Compulsive Symptoms:   Checking Handwashing, Psychotic Symptoms:  Negative PTSD Symptoms: Negative  Past Psychiatric History:  Previous Psych Hospitalizations: Denies  Outpatient treatment: Currently seeing neurology for concerns of Alzheimer's disease.  Medications Current: - Aricept 10 mg  once daily from neurology - Gabapentin  800 mg 3 times a day for anxiety, patient self increase the medication from 400 mg 3 times a day due to anxiety and reports no side effects and would prefer to  continue   Medication Trials: -Fluoxetine , poor response  Suicide & Violence: -Denies SI, HI, AVH   Psychotherapy: -Recommended for CBT. Provided Therapy referral list and placed on wait list for Nellie Banas at [facility]  Legal:  -No legal issues  Previous Psychotropic Medications: Yes   Substance Abuse History in the last 12 months:  No.  Consequences of Substance Abuse: NA  Past Medical History:  Past Medical History:  Diagnosis Date   ABLA (acute blood loss anemia) 10/24/2022   AICD (automatic cardioverter/defibrillator) present    Allergic rhinitis 09/25/2022   Back pain    Benign neoplasm of colon 04/27/2008   CAD (coronary artery disease) 04/11/2016   S/p ant STEMI 5/17: LHC >> LAD proximal 80%, mid 80%, distal 50%, ostial D1 60%; LCx with LPDA lesion 30%; RCA Mild calcification with no significant stenosis in a medium caliber, nondominant RCA; LVEF is estimated at 45% with inferoapical and lateral wall akinesis >> PCI: PCI: 3.5 x 24 mm Promus DES to prox LAD, 2.5 x 12 mm Promus DES to mid LAD.   Cerebellar stroke 03/30/2020   2021 MRI - Small, old left cerebellar infarct    Chest pain    Chronic HFrEF (heart failure with reduced ejection fraction) 10/25/2022   Chronic systolic CHF (congestive heart failure) 03/21/2016   Echo 01/30/17: Diff HK, mild focal basal septal hypertrophy, EF 30-35, mild AI, MAC, mild MR // Echo 06/08/16: Mild focal basal septal hypertrophy, EF 25-30%, diff HK, ant-septal AK, Gr 1 DD, mild AI, MAC, mild MR, PASP 37 mmHg // Echo 03/18/16: EF 30-35%, ant-septal AK, Gr 1 DD, mild MR, severe LAE.     Constipation    Diarrhea 01/02/2023   Dizziness    Dyspnea    Endotracheal tube present 10/24/2022   Essential hypertension 04/27/2008   Well-controlled today.  She will continue valsartan  80 mg twice a day, Imdur  30 mg daily, Lasix  20 mg every other day, [name]  10 mg daily, and carvedilol  12.5 mg twice daily.   Finger fracture 08/15/2021   She reports this  is well-healing.  She will continue to see orthopedics.   Generalized anxiety disorder 04/27/2008   Glaucoma    Gout    History of acute anterior wall MI 03/17/2016   History of colonoscopy 04/27/2008   History of COVID-19 06/27/2022   HLD (hyperlipidemia) 07/28/2015   Check lipid panel.  She will continue Lipitor .   Hot flash, menopausal 02/07/2018   Will check with our clinical pharmacist regarding the black cohosh and primrose in this patient.   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   Iron deficiency anemia, unspecified 04/27/2008   Ischemic cardiomyopathy 10/02/2016   Joint pain    Left bundle branch block 12/03/2018   Chronic.  She will follow with cardiology.   Major depressive disorder 07/28/2015   Chronic ongoing issue.  Has worsened somewhat recently.  Notes passive SI.  Advised if she develops intent or plan to harm herself she needs to go to the emergency room.  I advised her to contact her psychiatrist to arrange for follow-up and she was willing to do so.  She does have  protective factors in place including her husband, dog, and nephew.   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  She will follow with cardiology.  We will send her EKG to her psychiatrist and have CMA contact the psychiatry office to inform them that the EKG was faxed and of the results.   Rash 02/07/2018   Possibly related to folliculitis or some other undetermined cause of her rash.  She will trial over-the-counter antibiotic ointment and if not beneficial she will let us  know and we can refer her to dermatology.   S/P  mitral valve clip implantation 10/04/2022   MitraClip NTWx1 and NTx1 with Dr. Arlester Ladd and Dr. 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  She will continue [name]  10 mg daily.   Uterine leiomyoma 04/27/2008   Vitamin D  deficiency 09/15/2010    Past Surgical History:  Procedure Laterality Date   APPENDECTOMY     BIV ICD INSERTION CRT-D N/A 01/02/2022   Procedure: BIV ICD INSERTION CRT-D;  Surgeon: Boyce Byes, MD;  Location: [facility] INVASIVE CV LAB;  Service: Cardiovascular;  Laterality: N/A;   CARDIAC CATHETERIZATION N/A 03/17/2016   Procedure: Left Heart Cath and Coronary Angiography;  Surgeon: Arnoldo Lapping, MD;  Location: [facility] INVASIVE CV LAB;  Service: Cardiovascular;  Laterality: N/A;   CARDIAC CATHETERIZATION N/A 03/17/2016   Procedure: Coronary Stent Intervention;  Surgeon: Arnoldo Lapping, MD;  Location: [facility] INVASIVE CV LAB;  Service: Cardiovascular;  Laterality: N/A;   COLONOSCOPY WITH PROPOFOL  N/A 08/13/2017   Procedure: COLONOSCOPY WITH PROPOFOL ;  Surgeon: Luke Salaam, MD;  Location: [facility] ENDOSCOPY;  Service: Gastroenterology;  Laterality: N/A;   COLONOSCOPY WITH PROPOFOL  N/A 03/29/2020   Procedure: COLONOSCOPY WITH PROPOFOL ;  Surgeon: Marnee Sink, MD;  Location: [facility] ENDOSCOPY;  Service: Endoscopy;  Laterality: N/A;   EXPLORATION POST OPERATIVE OPEN HEART N/A 10/24/2022   Procedure: EXPLORATION POST OPERATIVE OPEN HEART;  Surgeon: Melene Sportsman, MD;  Location: [facility] OR;  Service: Open Heart Surgery;  Laterality: N/A;   MITRAL VALVE REPLACEMENT N/A 10/24/2022   Procedure: MITRAL VALVE (MV) REPLACEMENT USING A 29 MM MEDTRONIC MOSAIC 310;  Surgeon: Melene Sportsman, MD;  Location: [facility] OR;  Service: Open Heart Surgery;  Laterality: N/A;   RIGHT HEART  CATH N/A 10/15/2022   Procedure: RIGHT HEART CATH;  Surgeon: Arnoldo Lapping, MD;  Location: [facility] INVASIVE CV LAB;  Service: Cardiovascular;  Laterality: N/A;   RIGHT/LEFT HEART CATH AND CORONARY ANGIOGRAPHY N/A 08/02/2022   Procedure: RIGHT/LEFT HEART CATH AND CORONARY ANGIOGRAPHY;  Surgeon: Mardell Shade, MD;  Location: [facility] INVASIVE CV LAB;  Service: Cardiovascular;  Laterality: N/A;   SMALL BOWEL REPAIR     TEE WITHOUT CARDIOVERSION N/A 08/02/2022   Procedure: TRANSESOPHAGEAL ECHOCARDIOGRAM (TEE);  Surgeon: Mardell Shade, MD;  Location: [facility] ENDOSCOPY;  Service: Cardiovascular;  Laterality: N/A;   TEE WITHOUT CARDIOVERSION N/A 10/04/2022   Procedure: TRANSESOPHAGEAL ECHOCARDIOGRAM (TEE);  Surgeon: Arnoldo Lapping, MD;  Location: [facility] INVASIVE CV LAB;  Service: Cardiovascular;  Laterality: N/A;   TEE WITHOUT CARDIOVERSION N/A  10/24/2022   Procedure: TRANSESOPHAGEAL ECHOCARDIOGRAM (TEE);  Surgeon: Melene Sportsman, MD;  Location: [facility] OR;  Service: Open Heart Surgery;  Laterality: N/A;   TONSILLECTOMY     TRANSCATHETER MITRAL EDGE TO EDGE REPAIR N/A 10/04/2022   Procedure: MITRAL VALVE REPAIR;  Surgeon: Arnoldo Lapping, MD;  Location: [facility] INVASIVE CV LAB;  Service: Cardiovascular;  Laterality: N/A;   UTERINE FIBROID SURGERY      Family Psychiatric History: Patient endorses mother having anxiety diagnosed.  Family History:  Family History  Problem Relation Age of Onset   Anxiety disorder Mother    Paranoid behavior Mother    Hypertension Mother    Dementia Mother    High Cholesterol Mother    Diabetes Mother    Hyperlipidemia Mother    Depression Mother    Hypertension Father    High Cholesterol Father    Mood Disorder Sister    Stroke Sister    Anxiety disorder Maternal Aunt    Tuberculosis Paternal Grandfather    Drug abuse Cousin     Social History:   Social History   Socioeconomic History   Marital status: Widowed    Spouse name: Aliceia Lamonica   Number of children:  0   Years of education: 16   Highest education level: Bachelor's degree (e.g., BA, AB, BS)  Occupational History   Occupation: Retired    Comment: Runner, broadcasting/film/video  Tobacco Use   Smoking status: Former    Current packs/day: 0.00    Average packs/day: 0.5 packs/day for 25.0 years (12.5 ttl pk-yrs)    Types: Cigarettes    Start date: 04/13/1972    Quit date: 04/13/1997    Years since quitting: 26.9   Smokeless tobacco: Never  Vaping Use   Vaping status: Never Used  Substance and Sexual Activity   Alcohol use: Not Currently   Drug use: No   Sexual activity: Not Currently  Other Topics Concern   Not on file  Social History Narrative   Lives in [place] with spouse.  No children.   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   Left-handed   Lives in a two story home       Social Drivers of Health   Financial Resource Strain: Low Risk  (10/18/2023)   Received from [facility]   Overall Financial Resource Strain (CARDIA)    Difficulty of Paying Living Expenses: Not hard at all  Recent Concern: Financial Resource Strain - Medium Risk (08/08/2023)   Overall Financial Resource Strain (CARDIA)    Difficulty of Paying Living Expenses: Somewhat hard  Food Insecurity: No Food Insecurity (10/18/2023)   Received from [facility]   Hunger Vital Sign    Worried About Running Out of Food in the Last Year: Never true    Ran Out of Food in the Last Year: Never true  Transportation Needs: No Transportation Needs (10/18/2023)   Received from [facility] - Transportation    In the past 12 months, has lack of transportation kept you from medical appointments or from getting medications?: No    Lack of Transportation (Non-Medical): No  Physical Activity: Inactive (08/08/2023)   Exercise Vital Sign    Days of Exercise per Week: 0 days    Minutes of Exercise per Session: 0  min  Stress: Stress Concern Present (08/08/2023)   [employer] of Occupational Health - Occupational Stress  Questionnaire    Feeling of Stress : Very much  Social Connections: Moderately Integrated (08/08/2023)   Social Connection and Isolation Panel [NHANES]    Frequency of Communication with Friends and Family: More than three times a week    Frequency of Social Gatherings with Friends and Family: Never    Attends Religious Services: More than 4 times per year    Active Member of [employer] or Organizations: No    Attends Banker Meetings: Never    Marital Status: Married    Additional Social History: No additional social history  Allergies:   Allergies  Allergen Reactions   Tetracycline Swelling   Meperidine Nausea And Vomiting    (Demerol)Nausea   Entresto  [Sacubitril -Valsartan ] Other (See Comments)    Shortness of Breath    Metabolic Disorder Labs: Lab Results  Component Value Date   HGBA1C 5.9 03/20/2023   MPG 111.15 05/07/2022   MPG 139.85 03/31/2020   No results found for: "PROLACTIN" Lab Results  Component Value Date   CHOL 129 03/20/2023   TRIG 46.0 03/20/2023   HDL 44.90 03/20/2023   CHOLHDL 3 03/20/2023   VLDL 9.2 03/20/2023   LDLCALC 75 03/20/2023   LDLCALC 83 12/19/2021   Lab Results  Component Value Date   TSH 0.79 12/28/2022    Therapeutic Level Labs: No results found for: "LITHIUM" No results found for: "CBMZ" No results found for: "VALPROATE"  Current Medications: Current Outpatient Medications  Medication Sig Dispense Refill   aspirin  EC 81 MG tablet Take 1 tablet (81 mg total) by mouth daily. Swallow whole. 30 tablet 12   atorvastatin  (LIPITOR ) 80 MG tablet Take 1 tablet (80 mg total) by mouth daily. 90 tablet 3   carvedilol  (COREG ) 12.5 MG tablet Take 1 tablet (12.5 mg total) by mouth 2 (two) times daily with a meal. 180 tablet 2   dorzolamide -timolol  (COSOPT ) 22.3-6.8 MG/ML ophthalmic solution Place 1 drop into both eyes 2  (two) times daily.      DULoxetine (CYMBALTA) 30 MG capsule Take 30 mg by mouth daily.     fluticasone  (FLONASE ) 50 MCG/ACT nasal spray Place 1 spray into both nostrils daily. 15.8 mL 2   furosemide  (LASIX ) 40 MG tablet Take 40 mg by mouth as needed.     gabapentin  (NEURONTIN ) 400 MG capsule Take 1 capsule (400 mg total) by mouth 2 (two) times daily. (Patient taking differently: Take 400 mg by mouth 3 (three) times daily.) 60 capsule 3   isosorbide  mononitrate (IMDUR ) 30 MG 24 hr tablet Take 1 tablet by mouth once daily 90 tablet 3   latanoprost  (XALATAN ) 0.005 % ophthalmic solution Place 1 drop into both eyes at bedtime.     meloxicam (MOBIC) 7.5 MG tablet Take 1 tablet by mouth 2 (two) times daily.     mirtazapine  (REMERON ) 7.5 MG tablet Take 2 tablets (15 mg total) by mouth at bedtime. 60 tablet 0   QUEtiapine (SEROQUEL) 50 MG tablet Take 50 mg by mouth as needed (sleep).     valsartan  (DIOVAN ) 80 MG tablet Take 1 tablet by mouth twice daily (Patient taking differently: Take 80 mg by mouth daily.) 180 tablet 0   donepezil (ARICEPT) 10 MG tablet Take 10 mg by mouth at bedtime.     No current facility-administered medications for this visit.    Musculoskeletal: Strength & Muscle Tone: decreased Gait & Station: normal Patient leans: N/A  Psychiatric Specialty Exam: Review of Systems  Constitutional: Negative.   HENT: Negative.  Eyes: Negative.   Respiratory: Negative.    Cardiovascular: Negative.   Gastrointestinal: Negative.   Endocrine: Negative.   Genitourinary: Negative.   Musculoskeletal: Negative.   Skin: Negative.   Allergic/Immunologic: Negative.   Neurological: Negative.   Hematological: Negative.   Psychiatric/Behavioral:  Positive for behavioral problems and dysphoric mood. The patient is nervous/anxious.     Blood pressure 138/78, pulse 83, temperature 98.2 F (36.8 C), temperature source Temporal, height 5' 1.81" (1.57 m), weight 146 lb 6.4 oz (66.4 kg), SpO2  99%.Body mass index is 26.94 kg/m.  General Appearance: Well Groomed  Eye Contact:  Good  Speech:  Clear and Coherent  Volume:  Normal  Mood:  Anxious and Depressed  Affect:  Depressed  Thought Process:  Coherent  Orientation:  Full (Time, Place, and Person)  Thought Content:  Obsessions  Suicidal Thoughts:  No  Homicidal Thoughts:  No  Memory:  Immediate;   Good Recent;   Good Remote;   Good  Judgement:  Good  Insight:  Good  Psychomotor Activity:  Normal  Concentration:  Concentration: Good and Attention Span: Good  Recall:  Good  Fund of Knowledge:Fair  Language: Good  Akathisia:  No  Handed:  Right  AIMS (if indicated):  not done  Assets:  Financial Resources/Insurance Housing Social Support  ADL's:  Intact  Cognition: WNL  Sleep:  Good   Screenings: GAD-7    Flowsheet Row Office Visit from 11/13/2023 in [facility] [employer] Visit from 09/24/2023 in [facility] [employer] Visit from 08/14/2023 in [facility] [employer] Visit from 05/13/2023 in [facility] [employer] Visit from 04/22/2023 in [facility] [employer]  Total GAD-7 Score 18 0 18 18 15       PHQ2-9    Flowsheet Row Office Visit from 11/13/2023 in [facility] [employer] Visit from 09/24/2023 in [facility] [employer] Visit from 08/14/2023 in [facility] [facility] [employer] Clinical Support from 08/08/2023 in [facility] at [facility] Coordination from 05/30/2023 in [facility] Community Care Coordination  PHQ-2 Total Score 4 4 6 5 4   PHQ-9 Total Score 16 14 18 18  --      Flowsheet Row Clinical Support from 08/08/2023 in [facility] [place] [facility] Admission (Discharged) from  10/24/2022 in [facility] 4E CV SURGICAL PROGRESSIVE CARE Admission (Discharged) from 10/14/2022 in [place] 2H CARDIOVASCULAR ICU  C-SSRS RISK CATEGORY Error: Q3, 4, or 5 should not be populated when Q2 is No No Risk Low Risk       Assessment and Plan:  Assessment - Diagnosis: Mixed obsessional thoughts and acts [F42.2]  2. Grieving [F43.21]  3. Generalized anxiety disorder [F41.1]   - Progress: Baseline appointment - Risk Factors: Worsening symptoms, suicide risk  Plan - Medications:  Continue to take Gabapentin  at 800mg  TID for anxiety. Pt reports she increased the dose on her own, and is stating no complications of sedation. Start Sertraline 25mg  once a day, for OCD, patient has been educated on the medication purpose as well as the recommendation for SSRIs with obsessive-compulsive disorder.  Patient has been educated on the side effects of jitteriness nervousness and headaches initially when starting medication and as the patient to bear within and notify the clinic should it last longer than a week. Continue Quetiapine 50mg   once a day at night, prescribed by another provider patient was reminded of sedation nature of the medication to take her time and waking up as well as monitoring sedation in the mornings and to notify the clinician should it become an issue. - Psychotherapy: Based on evidence-based recommendations patient is recommended for cognitive behavioral therapy, patient was provided a therapy list.  Patient is also being recommended to be placed on wait list for Nellie Banas who specializes with OCD here at the [facility] office. - Education: Patient has been educated on the evidence-based recommendations for therapy and pharmacotherapy with OCD symptoms.  As patient is identifying severe symptoms of rituals with OCD patient is recommended to start on medications while waiting for a therapy referral.  Patient has been educated on medications with medication purpose, side effects and  adverse reactions.  Patient has also been educated on the need for community support and community involvement during isolation as the patient has a recent widow of 2 months ago.  Patient has been encouraged to seek social groups within her faith-based or community base groups.  Patient has also been educated on importance of following up weekly due to severe symptoms to monitor medication effectiveness as well as possible adjustments. - Follow-Up: Patient will follow up in 1 week - Referrals: Patient has been referred to outside therapy as well as being placed on the wait list for Nellie Banas here at [facility] office. - Safety Planning: The patient has been educated, if they should have suicidal thoughts with or without a plan to call 911, or go to the closest emergency department.  Pt verbalized understanding.  Pt denies firearms within the home.  Pt also agrees to call the clinic should they have worsening symptoms before the next appointment.   Patient/Guardian was advised Release of Information must be obtained prior to any record release in order to collaborate their care with an outside provider. Patient/Guardian was advised if they have not already done so to contact the registration department to sign all necessary forms in order for us  to release information regarding their care.   Consent: Patient/Guardian gives verbal consent for treatment and assignment of benefits for services provided during this visit. Patient/Guardian expressed understanding and agreed to proceed.   This office note has been dictated. This dictation was prepared using Air traffic controller. As a result, errors may occur. When identified, these errors have been corrected. While every attempt is made to correct errors during dictation, errors may still exist.   Arlana Labor, NP 4/29/20258:40 AM

## 2024-03-05 DIAGNOSIS — H43813 Vitreous degeneration, bilateral: Secondary | ICD-10-CM | POA: Diagnosis not present

## 2024-03-05 DIAGNOSIS — H401132 Primary open-angle glaucoma, bilateral, moderate stage: Secondary | ICD-10-CM | POA: Diagnosis not present

## 2024-03-05 DIAGNOSIS — R42 Dizziness and giddiness: Secondary | ICD-10-CM | POA: Diagnosis not present

## 2024-03-05 DIAGNOSIS — G3184 Mild cognitive impairment, so stated: Secondary | ICD-10-CM | POA: Diagnosis not present

## 2024-03-05 DIAGNOSIS — E119 Type 2 diabetes mellitus without complications: Secondary | ICD-10-CM | POA: Diagnosis not present

## 2024-03-12 ENCOUNTER — Ambulatory Visit: Admitting: Psychiatry

## 2024-03-14 ENCOUNTER — Emergency Department

## 2024-03-14 ENCOUNTER — Other Ambulatory Visit: Payer: Self-pay

## 2024-03-14 ENCOUNTER — Emergency Department
Admission: EM | Admit: 2024-03-14 | Discharge: 2024-03-14 | Disposition: A | Discharge status: Home / Self Care | Attending: Emergency Medicine | Admitting: Emergency Medicine

## 2024-03-14 DIAGNOSIS — I517 Cardiomegaly: Secondary | ICD-10-CM | POA: Diagnosis not present

## 2024-03-14 DIAGNOSIS — I10 Essential (primary) hypertension: Secondary | ICD-10-CM | POA: Insufficient documentation

## 2024-03-14 DIAGNOSIS — R29818 Other symptoms and signs involving the nervous system: Secondary | ICD-10-CM | POA: Diagnosis not present

## 2024-03-14 DIAGNOSIS — Z951 Presence of aortocoronary bypass graft: Secondary | ICD-10-CM | POA: Diagnosis not present

## 2024-03-14 DIAGNOSIS — R42 Dizziness and giddiness: Secondary | ICD-10-CM | POA: Diagnosis not present

## 2024-03-14 DIAGNOSIS — R0989 Other specified symptoms and signs involving the circulatory and respiratory systems: Secondary | ICD-10-CM | POA: Diagnosis not present

## 2024-03-14 DIAGNOSIS — R531 Weakness: Secondary | ICD-10-CM | POA: Insufficient documentation

## 2024-03-14 DIAGNOSIS — I639 Cerebral infarction, unspecified: Secondary | ICD-10-CM | POA: Diagnosis not present

## 2024-03-14 DIAGNOSIS — I2510 Atherosclerotic heart disease of native coronary artery without angina pectoris: Secondary | ICD-10-CM | POA: Diagnosis not present

## 2024-03-14 LAB — CBC WITH DIFFERENTIAL/PLATELET
Abs Immature Granulocytes: 0.01 10*3/uL (ref 0.00–0.07)
Basophils Absolute: 0 10*3/uL (ref 0.0–0.1)
Basophils Relative: 1 %
Eosinophils Absolute: 0.1 10*3/uL (ref 0.0–0.5)
Eosinophils Relative: 1 %
HCT: 36.3 % (ref 36.0–46.0)
Hemoglobin: 11.6 g/dL — ABNORMAL LOW (ref 12.0–15.0)
Immature Granulocytes: 0 %
Lymphocytes Relative: 16 %
Lymphs Abs: 0.8 10*3/uL (ref 0.7–4.0)
MCH: 27.2 pg (ref 26.0–34.0)
MCHC: 32 g/dL (ref 30.0–36.0)
MCV: 85.2 fL (ref 80.0–100.0)
Monocytes Absolute: 0.4 10*3/uL (ref 0.1–1.0)
Monocytes Relative: 8 %
Neutro Abs: 3.9 10*3/uL (ref 1.7–7.7)
Neutrophils Relative %: 74 %
Platelets: 237 10*3/uL (ref 150–400)
RBC: 4.26 MIL/uL (ref 3.87–5.11)
RDW: 13.6 % (ref 11.5–15.5)
WBC: 5.2 10*3/uL (ref 4.0–10.5)
nRBC: 0 % (ref 0.0–0.2)

## 2024-03-14 LAB — COMPREHENSIVE METABOLIC PANEL WITH GFR
ALT: 11 U/L (ref 0–44)
AST: 19 U/L (ref 15–41)
Albumin: 3.5 g/dL (ref 3.5–5.0)
Alkaline Phosphatase: 108 U/L (ref 38–126)
Anion gap: 8 (ref 5–15)
BUN: 18 mg/dL (ref 8–23)
CO2: 21 mmol/L — ABNORMAL LOW (ref 22–32)
Calcium: 9.4 mg/dL (ref 8.9–10.3)
Chloride: 108 mmol/L (ref 98–111)
Creatinine, Ser: 0.98 mg/dL (ref 0.44–1.00)
GFR, Estimated: >60 mL/min (ref >60)
Glucose, Bld: 173 mg/dL — ABNORMAL HIGH (ref 70–99)
Potassium: 3.8 mmol/L (ref 3.5–5.1)
Sodium: 137 mmol/L (ref 135–145)
Total Bilirubin: 0.9 mg/dL (ref 0.0–1.2)
Total Protein: 7 g/dL (ref 6.5–8.1)

## 2024-03-14 LAB — TROPONIN I (HIGH SENSITIVITY): Troponin I (High Sensitivity): 18 ng/L — ABNORMAL HIGH (ref <18)

## 2024-03-14 MED ORDER — SODIUM CHLORIDE 0.9 % IV BOLUS
1000.0000 mL | Freq: Once | INTRAVENOUS | Status: AC
Start: 2024-03-14 — End: 2024-03-14
  Administered 2024-03-14: 1000 mL via INTRAVENOUS

## 2024-03-14 NOTE — ED Notes (Signed)
 This RN assisted pt on to bed pan. Full linen change provided. NAD, CB within reach.

## 2024-03-14 NOTE — ED Provider Notes (Signed)
 [facility] Provider Note    Event Date/Time   First MD Initiated Contact with Patient 03/14/24 1134     (approximate)  History   Chief Complaint: Dizziness  HPI  Melanie Ray is a 73 y.o. female with a past medical history of AICD/pacemaker, CAD status post CABG 2 years ago, prior CVA, hypertension, hyperlipidemia, presents to the emergency department for weakness/dizziness.  According to the patient her last 2 days or so she has been feeling weak and dizzy at times.  States the dizziness seems most like a spinning sensation where she will have to steady herself while walking.  Patient also states she has been experiencing "hot flashes" where she will get sweaty however she states this is a chronic issue for her and has not noticed any increased recently.  Patient denies any chest pain or shortness of breath.  Denies any weakness or numbness of any arm or leg.  Denies any recent illnesses cough congestion nausea vomiting or diarrhea.  Patient does note she has been out of her lisinopril  for some time and has not been taking this medication.  However patient noted to be somewhat hypotensive around 90 systolic in the emergency department.  Physical Exam   Triage Vital Signs: ED Triage Vitals  Encounter Vitals Group     BP 03/14/24 1130 (!) 97/51     Systolic BP Percentile --      Diastolic BP Percentile --      Pulse Rate 03/14/24 1130 77     Resp 03/14/24 1130 (!) 23     Temp --      Temp src --      SpO2 03/14/24 1130 93 %     Weight 03/14/24 1111 140 lb (63.5 kg)     Height 03/14/24 1111 5\' 3"  (1.6 m)     Head Circumference --      Peak Flow --      Pain Score 03/14/24 1111 0     Pain Loc --      Pain Education --      Exclude from Growth Chart --     Most recent vital signs: Vitals:   03/14/24 1130 03/14/24 1141  BP: (!) 97/51 (!) 102/54  Pulse: 77 76  Resp: (!) 23 (!) 22  SpO2: 93% 96%    General: Awake, no distress.  CV:  Good  peripheral perfusion.  Regular rate and rhythm  Resp:  Normal effort.  Equal breath sounds bilaterally.  Abd:  No distention.  Soft, nontender.  No rebound or guarding. Other:  Mildly diaphoretic.   ED Results / Procedures / Treatments   EKG  EKG viewed and interpreted by myself shows a AV dual paced rhythm at 84 bpm with a borderline widened QRS left axis deviation nonspecific ST changes.  No significant finding given the paced rhythm.  RADIOLOGY  I have reviewed interpret the chest x-ray images.  No consolidation seen on my evaluation.  Pacemaker present. CT scan of the head shows no acute finding.  MEDICATIONS ORDERED IN ED: Medications  sodium chloride  0.9 % bolus 1,000 mL (has no administration in time range)     IMPRESSION / MDM / ASSESSMENT AND PLAN / ED COURSE  I reviewed the triage vital signs and the nursing notes.  Patient's presentation is most consistent with acute presentation with potential threat to life or bodily function.  Patient presents the emergency department for weakness/dizziness described more as a spinning sensation.  Patient  is in no distress answering questions in full sentences with no issue.  CBC reassuring chemistry reassuring.  I have added on a troponin as a precaution we will also IV hydrate.  Blood pressure currently 102/54.  We will also obtain a chest x-ray and a respiratory swab while awaiting further workup.  Patient does have a history of a prior cerebellar CVA and the patient states intermittent dizziness at times but feels like it has been worse over the last few days.  We will obtain a CT scan of the head as a precaution.  Patient's workup was overall reassuring.  CT scan is normal.  Chest x-ray overall clear.  Patient's labs today show a reassuring CBC with a normal white blood cell count, reassuring chemistry.  Troponin borderline elevated at 18.  Patient's blood pressure has improved currently 145 systolic during my evaluation.  Patient is  feeling much better.  Given the patient's reassuring workup I believe the patient safe for discharge home with outpatient follow-up.  I did discuss with the patient to continue not to take her lisinopril  and to check her blood pressure at home if able.  Otherwise follow-up with her PCP on Monday.  Patient agreeable to plan for  FINAL CLINICAL IMPRESSION(S) / ED DIAGNOSES   Weakness  Note:  This document was prepared using Dragon voice recognition software and may include unintentional dictation errors.   Ruth Cove, MD 03/14/24 [phone]

## 2024-03-14 NOTE — Discharge Instructions (Addendum)
 As we discussed please drink plenty of fluids.  Please continue to not take your lisinopril  until this has been restarted by your doctor.  Please follow-up with your doctor Monday or Tuesday for recheck/reevaluation.  Please check your blood pressure at home or at a pharmacy over the next couple days if you are able.  Return to the emergency department for any symptoms concerning to yourself.

## 2024-03-14 NOTE — ED Triage Notes (Signed)
 Pt comes in today via pov with complaints of dizziness since yesterday. Pt states that she just realized that she hasn't been taking any of her blood pressure medication because she ran out. Pt is unsure when she stopped taking it. Pt has no complaints of chest pain or SOB. Pt is alert and oriented x4, with no signs of acute distress at this time.

## 2024-03-19 ENCOUNTER — Ambulatory Visit: Admitting: Psychiatry

## 2024-03-19 ENCOUNTER — Encounter: Payer: Self-pay | Admitting: Psychiatry

## 2024-03-19 ENCOUNTER — Telehealth: Payer: Self-pay

## 2024-03-19 VITALS — BP 136/80 | HR 82 | Temp 98.0°F | Ht 63.0 in | Wt 147.0 lb

## 2024-03-19 DIAGNOSIS — F411 Generalized anxiety disorder: Secondary | ICD-10-CM | POA: Diagnosis not present

## 2024-03-19 DIAGNOSIS — F4321 Adjustment disorder with depressed mood: Secondary | ICD-10-CM

## 2024-03-19 DIAGNOSIS — F422 Mixed obsessional thoughts and acts: Secondary | ICD-10-CM

## 2024-03-19 MED ORDER — SERTRALINE HCL 50 MG PO TABS: 50.0000 mg | ORAL_TABLET | Freq: Every day | ORAL | 0 refills | Status: DC

## 2024-03-19 MED ORDER — HYDROXYZINE HCL 10 MG PO TABS: 10.0000 mg | ORAL_TABLET | Freq: Three times a day (TID) | ORAL | 0 refills | Status: DC | PRN

## 2024-03-19 MED ORDER — SERTRALINE HCL 100 MG PO TABS: 100.0000 mg | ORAL_TABLET | Freq: Every day | ORAL | 0 refills | Status: DC

## 2024-03-19 NOTE — Progress Notes (Unsigned)
 BH MD/PA/NP OP Progress Note  03/19/2024 2:01 PM Melanie Ray  MRN:  [id number]  Chief Complaint:  Chief Complaint  Patient presents with   Follow-up   HPI: 73 year old female presenting to [facility] for follow-up.  Patient reports that she doubled up on her dose after 1 week after testing results.  Patient reports that her compulsions are still overwhelming and states that she has developed dizziness as well as increased hot flashes.  She does report that she was sent to the ER after feeling dizziness on 03/14/2024.  There is no significant fines from the study except a low blood pressure.  Blood pressure is normal today.  Patient reports that she would like to find a way to control her OCD compulsions.  Patient also reports that she is unable to find a therapist.  During this appointment patient was helped with finding a therapist, in which the provider helped her schedule an appointment with confirmation on [facility] [email or web address].  This appointment was scheduled for Thursday, 03/26/2024.  Patient will continue to take medications as prescribed patient was encouraged not to increase medication on alone and to wait for a provider to give her the okay to increase her prescription.  Patient still stating that she has generalized anxious feelings stating that she gets nervous at times.  Patient was recommended to start hydroxyzine  10 mg 3 times a day as needed for anxiety.  Patient educated on the sedating nature of the medication and not to drive while taking this medication.  Patient is encouraged note that she has several trips coming up over [place] she is traveling to [place] with family and is looking forward to that.  Patient denies SI, HI, AVH.  Patient agrees to treatment plan and states that she will take medications as prescribed and participate in therapy.  Patient will follow up in 2 weeks. Visit Diagnosis:    ICD-10-CM   1. Mixed obsessional thoughts and acts  F42.2 sertraline  (ZOLOFT ) 50 MG  tablet    DISCONTINUED: sertraline  (ZOLOFT ) 100 MG tablet    2. Grieving  F43.21     3. Generalized anxiety disorder  F41.1 hydrOXYzine  (ATARAX ) 10 MG tablet      Past Psychiatric History:  Previous Psych Hospitalizations: Denies   Outpatient treatment: Currently seeing neurology for concerns of Alzheimer's disease.   Medications Current: - Aricept 10 mg once daily from neurology - Gabapentin  800 mg 3 times a day for anxiety, patient self increase the medication from 400 mg 3 times a day due to anxiety and reports no side effects and would prefer to continue - Sertraline  50mg  once daily for OCD     Medication Trials: -Fluoxetine , poor response   Suicide & Violence: -Denies SI, HI, AVH    Psychotherapy: -Recommended for CBT. Provided Therapy referral list and placed on wait list for Nellie Banas at [facility]   Legal:  -No legal issues  Past Medical History:  Past Medical History:  Diagnosis Date   ABLA (acute blood loss anemia) 10/24/2022   AICD (automatic cardioverter/defibrillator) present    Allergic rhinitis 09/25/2022   Back pain    Benign neoplasm of colon 04/27/2008   CAD (coronary artery disease) 04/11/2016   S/p ant STEMI 5/17: LHC >> LAD proximal 80%, mid 80%, distal 50%, ostial D1 60%; LCx with LPDA lesion 30%; RCA Mild calcification with no significant stenosis in a medium caliber, nondominant RCA; LVEF is estimated at 45% with inferoapical and lateral wall akinesis >> PCI: PCI: 3.5 x 24  mm Promus DES to prox LAD, 2.5 x 12 mm Promus DES to mid LAD.   Cerebellar stroke 03/30/2020   2021 MRI - Small, old left cerebellar infarct    Chest pain    Chronic HFrEF (heart failure with reduced ejection fraction) 10/25/2022   Chronic systolic CHF (congestive heart failure) 03/21/2016   Echo 01/30/17: Diff HK, mild focal basal septal hypertrophy, EF 30-35, mild AI, MAC, mild MR // Echo 06/08/16: Mild focal basal septal hypertrophy, EF 25-30%, diff HK, ant-septal AK, Gr 1 DD, mild  AI, MAC, mild MR, PASP 37 mmHg // Echo 03/18/16: EF 30-35%, ant-septal AK, Gr 1 DD, mild MR, severe LAE.     Constipation    Diarrhea 01/02/2023   Dizziness    Dyspnea    Endotracheal tube present 10/24/2022   Essential hypertension 04/27/2008   Well-controlled today.  She will continue valsartan  80 mg twice a day, Imdur  30 mg daily, Lasix  20 mg every other day, [name]  10 mg daily, and carvedilol  12.5 mg twice daily.   Finger fracture 08/15/2021   She reports this is well-healing.  She will continue to see orthopedics.   Generalized anxiety disorder 04/27/2008   Glaucoma    Gout    History of acute anterior wall MI 03/17/2016   History of colonoscopy 04/27/2008   History of COVID-19 06/27/2022   HLD (hyperlipidemia) 07/28/2015   Check lipid panel.  She will continue Lipitor .   Hot flash, menopausal 02/07/2018   Will check with our clinical pharmacist regarding the black cohosh and primrose in this patient.   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   Iron deficiency anemia, unspecified 04/27/2008   Ischemic cardiomyopathy 10/02/2016   Joint pain    Left bundle branch block 12/03/2018   Chronic.  She will follow with cardiology.   Major depressive disorder 07/28/2015   Chronic ongoing issue.  Has worsened somewhat recently.  Notes passive SI.  Advised if she develops intent or plan to harm herself she needs to go to the emergency room.  I advised her to contact her psychiatrist to arrange for follow-up and she was willing to do so.  She does have protective factors in place including her husband, dog, and nephew.   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  She will follow with cardiology.  We will send her EKG to her psychiatrist and have CMA contact the psychiatry office to inform them that the EKG was faxed and of the results.   Rash 02/07/2018   Possibly related to folliculitis or some other undetermined cause of her rash.  She will trial over-the-counter antibiotic ointment and if not beneficial she will let us  know and we can refer her to dermatology.   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 Arlester Ladd and Dr. 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  She  will continue [name]  10 mg daily.   Uterine leiomyoma 04/27/2008   Vitamin D  deficiency 09/15/2010    Past Surgical History:  Procedure Laterality Date   APPENDECTOMY     BIV ICD INSERTION CRT-D N/A 01/02/2022   Procedure: BIV ICD INSERTION CRT-D;  Surgeon: Boyce Byes, MD;  Location: [facility] INVASIVE CV LAB;  Service: Cardiovascular;  Laterality: N/A;   CARDIAC CATHETERIZATION N/A 03/17/2016   Procedure: Left Heart Cath and Coronary Angiography;  Surgeon: Arnoldo Lapping, MD;  Location: [facility] INVASIVE CV LAB;  Service: Cardiovascular;  Laterality: N/A;   CARDIAC CATHETERIZATION N/A 03/17/2016   Procedure: Coronary Stent Intervention;  Surgeon: Arnoldo Lapping, MD;  Location: [facility] INVASIVE CV LAB;  Service: Cardiovascular;  Laterality: N/A;   COLONOSCOPY WITH PROPOFOL  N/A 08/13/2017   Procedure: COLONOSCOPY WITH PROPOFOL ;  Surgeon: Luke Salaam, MD;  Location: [facility] ENDOSCOPY;  Service: Gastroenterology;  Laterality: N/A;   COLONOSCOPY WITH PROPOFOL  N/A 03/29/2020   Procedure: COLONOSCOPY WITH PROPOFOL ;  Surgeon: Marnee Sink, MD;  Location: [facility]  ENDOSCOPY;  Service: Endoscopy;  Laterality: N/A;   EXPLORATION POST OPERATIVE OPEN HEART N/A 10/24/2022   Procedure: EXPLORATION POST OPERATIVE OPEN HEART;  Surgeon: Melene Sportsman, MD;  [facility] OR;  Service: Open Heart Surgery;  Laterality: N/A;   MITRAL VALVE REPLACEMENT N/A 10/24/2022   Procedure: MITRAL VALVE (MV) REPLACEMENT USING A 29 MM MEDTRONIC MOSAIC 310;  Surgeon: Melene Sportsman, MD;  Location: [facility] OR;  Service: Open Heart Surgery;  Laterality: N/A;   RIGHT HEART CATH N/A 10/15/2022   Procedure: RIGHT HEART CATH;  Surgeon: Arnoldo Lapping, MD;  Location: [facility] INVASIVE CV LAB;  Service: Cardiovascular;  Laterality: N/A;   RIGHT/LEFT HEART CATH AND CORONARY ANGIOGRAPHY N/A 08/02/2022   Procedure: RIGHT/LEFT HEART CATH AND CORONARY ANGIOGRAPHY;  Surgeon: Mardell Shade, MD;  Location: [facility] INVASIVE CV LAB;  Service: Cardiovascular;  Laterality: N/A;   SMALL BOWEL REPAIR     TEE WITHOUT CARDIOVERSION N/A 08/02/2022   Procedure: TRANSESOPHAGEAL ECHOCARDIOGRAM (TEE);  Surgeon: Mardell Shade, MD;  Location: [facility] ENDOSCOPY;  Service: Cardiovascular;  Laterality: N/A;   TEE WITHOUT CARDIOVERSION N/A 10/04/2022   Procedure: TRANSESOPHAGEAL ECHOCARDIOGRAM (TEE);  Surgeon: Arnoldo Lapping, MD;  Location: [facility] INVASIVE CV LAB;  Service: Cardiovascular;  Laterality: N/A;   TEE WITHOUT CARDIOVERSION N/A 10/24/2022   Procedure: TRANSESOPHAGEAL ECHOCARDIOGRAM (TEE);  Surgeon: Melene Sportsman, MD;  Location: [facility] OR;  Service: Open Heart Surgery;  Laterality: N/A;   TONSILLECTOMY     TRANSCATHETER MITRAL EDGE TO EDGE REPAIR N/A 10/04/2022   Procedure: MITRAL VALVE REPAIR;  Surgeon: Arnoldo Lapping, MD;  Location: [facility] INVASIVE CV LAB;  Service: Cardiovascular;  Laterality: N/A;   UTERINE FIBROID SURGERY      Family Psychiatric History: Patient endorses mother having anxiety diagnosed.   Family History:  Family History  Problem Relation Age of Onset   Anxiety disorder Mother    Paranoid behavior  Mother    Hypertension Mother    Dementia Mother    High Cholesterol Mother    Diabetes Mother    Hyperlipidemia Mother    Depression Mother    Hypertension Father    High Cholesterol Father    Mood Disorder Sister    Stroke Sister    Anxiety disorder Maternal Aunt    Tuberculosis Paternal Grandfather    Drug abuse Cousin     Social History:  Social History   Socioeconomic History   Marital status: Widowed    Spouse name: Jersei Gubbels   Number  of children: 0   Years of education: 16   Highest education level: Bachelor's degree (e.g., BA, AB, BS)  Occupational History   Occupation: Retired    Comment: Runner, broadcasting/film/video  Tobacco Use   Smoking status: Former    Current packs/day: 0.00    Average packs/day: 0.5 packs/day for 25.0 years (12.5 ttl pk-yrs)    Types: Cigarettes    Start date: 04/13/1972    Quit date: 04/13/1997    Years since quitting: 26.9   Smokeless tobacco: Never  Vaping Use   Vaping status: Never Used  Substance and Sexual Activity   Alcohol use: Not Currently   Drug use: No   Sexual activity: Not Currently  Other Topics Concern   Not on file  Social History Narrative   Lives in [place] with spouse.  No children.   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   Left-handed   Lives in a two story home       Social Drivers of Health   Financial Resource Strain: Low Risk  (10/18/2023)   Received from [facility]   Overall Financial Resource Strain (CARDIA)    Difficulty of Paying Living Expenses: Not hard at all  Recent Concern: Financial Resource Strain - Medium Risk (08/08/2023)   Overall Financial Resource Strain (CARDIA)    Difficulty of Paying Living Expenses: Somewhat hard  Food Insecurity: No Food Insecurity (10/18/2023)   Received from [facility]   Hunger Vital Sign    Worried About Running Out of Food in the Last Year: Never true     Ran Out of Food in the Last Year: Never true  Transportation Needs: No Transportation Needs (10/18/2023)   Received from [facility] - Transportation    In the past 12 months, has lack of transportation kept you from medical appointments or from getting medications?: No    Lack of Transportation (Non-Medical): No  Physical Activity: Inactive (08/08/2023)   Exercise Vital Sign    Days of Exercise per Week: 0 days    Minutes of Exercise per Session: 0 min  Stress: Stress Concern Present (08/08/2023)   [employer] of Occupational Health - Occupational Stress Questionnaire    Feeling of Stress : Very much  Social Connections: Moderately Integrated (08/08/2023)   Social Connection and Isolation Panel [NHANES]    Frequency of Communication with Friends and Family: More than three times a week    Frequency of Social Gatherings with Friends and Family: Never    Attends Religious Services: More than 4 times per year    Active Member of [employer] or Organizations: No    Attends Banker Meetings: Never    Marital Status: Married    Allergies:  Allergies  Allergen Reactions   Tetracycline Swelling   Meperidine Nausea And Vomiting    (Demerol)Nausea   Entresto  [Sacubitril -Valsartan ] Other (See Comments)    Shortness of Breath    Metabolic Disorder Labs: Lab Results  Component Value Date   HGBA1C 5.9 03/20/2023   MPG 111.15 05/07/2022   MPG 139.85 03/31/2020   No results found for: "PROLACTIN" Lab Results  Component Value Date   CHOL 129 03/20/2023   TRIG 46.0 03/20/2023   HDL 44.90 03/20/2023   CHOLHDL 3 03/20/2023   VLDL 9.2 03/20/2023   LDLCALC 75 03/20/2023   LDLCALC 83 12/19/2021   Lab Results  Component  Value Date   TSH 0.79 12/28/2022   TSH 0.64 09/05/2021    Therapeutic Level Labs: No results found for: "LITHIUM" No results found for: "VALPROATE" No results found for: "CBMZ"  Current Medications: Current Outpatient  Medications  Medication Sig Dispense Refill   aspirin  EC 81 MG tablet Take 1 tablet (81 mg total) by mouth daily. Swallow whole. 30 tablet 12   atorvastatin  (LIPITOR ) 80 MG tablet Take 1 tablet (80 mg total) by mouth daily. 90 tablet 3   carvedilol  (COREG ) 12.5 MG tablet Take 1 tablet (12.5 mg total) by mouth 2 (two) times daily with a meal. 180 tablet 2   dorzolamide -timolol  (COSOPT ) 22.3-6.8 MG/ML ophthalmic solution Place 1 drop into both eyes 2 (two) times daily.      fluticasone  (FLONASE ) 50 MCG/ACT nasal spray Place 1 spray into both nostrils daily. 15.8 mL 2   furosemide  (LASIX ) 40 MG tablet Take 40 mg by mouth as needed.     gabapentin  (NEURONTIN ) 400 MG capsule Take 2 capsules (800 mg total) by mouth 3 (three) times daily. 60 capsule 3   isosorbide  mononitrate (IMDUR ) 30 MG 24 hr tablet Take 1 tablet by mouth once daily 90 tablet 3   latanoprost  (XALATAN ) 0.005 % ophthalmic solution Place 1 drop into both eyes at bedtime.     meloxicam (MOBIC) 7.5 MG tablet Take 1 tablet by mouth 2 (two) times daily.     QUEtiapine (SEROQUEL) 200 MG tablet Take 200 mg by mouth at bedtime.     sertraline  (ZOLOFT ) 25 MG tablet Take 1 tablet (25 mg total) by mouth daily. 30 tablet 0   valsartan  (DIOVAN ) 80 MG tablet Take 1 tablet by mouth twice daily (Patient taking differently: Take 80 mg by mouth daily.) 180 tablet 0   donepezil (ARICEPT) 10 MG tablet Take 10 mg by mouth at bedtime.     No current facility-administered medications for this visit.     Musculoskeletal: Strength & Muscle Tone: within normal limits Gait & Station: normal Patient leans: N/A  Psychiatric Specialty Exam: Review of Systems  Constitutional: Negative.   HENT: Negative.    Eyes: Negative.   Respiratory: Negative.    Cardiovascular: Negative.   Gastrointestinal: Negative.   Endocrine: Negative.   Genitourinary: Negative.   Musculoskeletal: Negative.   Skin: Negative.   Allergic/Immunologic: Negative.    Neurological: Negative.   Hematological: Negative.     Blood pressure 136/80, pulse 82, temperature 98 F (36.7 C), temperature source Temporal, height 5\' 3"  (1.6 m), weight 147 lb (66.7 kg), SpO2 92%.Body mass index is 26.04 kg/m.  General Appearance: Well Groomed  Eye Contact:  Good  Speech:  Clear and Coherent  Volume:  Normal  Mood:  Anxious  Affect:  Appropriate  Thought Process:  Coherent  Orientation:  Full (Time, Place, and Person)  Thought Content: Obsessions   Suicidal Thoughts:  No  Homicidal Thoughts:  No  Memory:  Immediate;   Good Recent;   Good Remote;   Good  Judgement:  Good  Insight:  Good  Psychomotor Activity:  Normal  Concentration:  Concentration: Good and Attention Span: Good  Recall:  Good  Fund of Knowledge: Good  Language: Good  Akathisia:  No  Handed:  Right  AIMS (if indicated): not done  Assets:  Desire for Improvement Leisure Time Social Support  ADL's:  Intact  Cognition: WNL  Sleep:  Good   Screenings: GAD-7    Flowsheet Row Office Visit from 11/13/2023 in [facility] [employer] from 09/24/2023 in [facility] [employer] Visit from 08/14/2023 in [facility] [employer] Visit from 05/13/2023 in [facility] [employer] Visit from 04/22/2023 in [facility] [employer]  Total GAD-7 Score 18 0 18 18 15       PHQ2-9    Flowsheet Row Office Visit from 11/13/2023 in [facility] at [employer] Visit from 09/24/2023 in [facility] at [employer] Visit from 08/14/2023 in [facility] at [employer] Clinical Support from 08/08/2023 in [facility] at [facility] Coordination from 05/30/2023 in [facility] Community Care Coordination  PHQ-2 Total Score  4 4 6 5 4   PHQ-9 Total Score 16 14 18 18  --      Flowsheet Row ED from 03/14/2024 in [facility] Emergency Department at [facility] Clinical Support from 08/08/2023 in [facility] at [facility] Admission (Discharged) from 10/24/2022 in [facility] 4E CV SURGICAL PROGRESSIVE CARE  C-SSRS RISK CATEGORY No Risk Error: Q3, 4, or 5 should not be populated when Q2 is No No Risk        Assessment and Plan:  Assessment - Diagnosis: Mixed obsessional thoughts and acts [F42.2]  2. Grieving [F43.21]  3. Generalized anxiety disorder [F41.1]   - Progress: Pt repots that  - Risk Factors: Worsening symptoms, suicide risk  Plan - Medications:  Continue to take Gabapentin  at 800mg  TID for anxiety. Pt reports she increased the dose on her own, and is stating no complications of sedation. Increase Sertraline  to 50mg  once a day, for OCD, patient has been educated on the medication purpose as well as the recommendation for SSRIs with obsessive-compulsive disorder.  Patient has been educated on the side effects of jitteriness nervousness and headaches initially when starting medication and as the patient to bear within and notify the clinic should it last longer than a week. Continue Quetiapine 200mg   once a day at night, prescribed by another provider patient was reminded of sedation nature of the medication to take her time and waking up as well as monitoring sedation in the mornings and to notify the clinician should it become an issue. - Psychotherapy: Based on evidence-based recommendations patient is recommended for cognitive behavioral therapy, patient was provided a therapy list.  Patient is also being recommended to be placed on wait list for Nellie Banas who specializes with OCD here at the [facility] office. - Education: Patient has been educated on the evidence-based recommendations for therapy and pharmacotherapy with OCD symptoms.  As patient is identifying severe symptoms of  rituals with OCD patient is recommended to start on medications while waiting for a therapy referral.  Patient has been educated on medications with medication purpose, side effects and adverse reactions.  Patient has also been educated on the need for community support and community involvement during isolation as the patient has a recent widow of 2 months ago.  Patient has been encouraged to seek social groups within her faith-based or community base groups.  Patient has also been educated on importance of following up weekly due to severe symptoms to monitor medication effectiveness as well as possible adjustments. - Follow-Up: Patient will follow up in 1 week - Referrals: Patient has been referred to outside therapy as well as being placed on the wait list for Nellie Banas here at [facility] office. - Safety  Planning: The patient has been educated, if they should have suicidal thoughts with or without a plan to call 911, or go to the closest emergency department.  Pt verbalized understanding.  Pt denies firearms within the home.  Pt also agrees to call the clinic should they have worsening symptoms before the next appointment.   Patient/Guardian was advised Release of Information must be obtained prior to any record release in order to collaborate their care with an outside provider. Patient/Guardian was advised if they have not already done so to contact the registration department to sign all necessary forms in order for us  to release information regarding their care.   Consent: Patient/Guardian gives verbal consent for treatment and assignment of benefits for services provided during this visit. Patient/Guardian expressed understanding and agreed to proceed.   This office note has been dictated. This dictation was prepared using Air traffic controller. As a result, errors may occur. When identified, these errors have been corrected. While every attempt is made to correct errors during  dictation, errors may still exist.   Arlana Labor, NP 03/19/2024, 2:01 PM

## 2024-03-19 NOTE — Telephone Encounter (Signed)
 [facility] wanted to make sure of the dosage of the sertraline  100mg . pt was taking 25mg  which was last filled on 4-29 and they wanted to make sure of the dosage.  pt seen today and next appt 5-28

## 2024-03-20 NOTE — Telephone Encounter (Signed)
 per Cristal Don, NP chat message "Hey I changed the prescription. She wanted it but I asked her to be patient so I sen t a new script 50mg  of Sertaline. I already called and educated the patient."

## 2024-04-01 ENCOUNTER — Encounter: Payer: Self-pay | Admitting: Psychiatry

## 2024-04-01 ENCOUNTER — Ambulatory Visit (INDEPENDENT_AMBULATORY_CARE_PROVIDER_SITE_OTHER): Admitting: Psychiatry

## 2024-04-01 VITALS — BP 122/68 | HR 80 | Temp 98.3°F | Ht 63.0 in | Wt 145.6 lb

## 2024-04-01 DIAGNOSIS — F422 Mixed obsessional thoughts and acts: Secondary | ICD-10-CM | POA: Diagnosis not present

## 2024-04-01 DIAGNOSIS — F411 Generalized anxiety disorder: Secondary | ICD-10-CM

## 2024-04-01 DIAGNOSIS — F4321 Adjustment disorder with depressed mood: Secondary | ICD-10-CM

## 2024-04-01 MED ORDER — SERTRALINE HCL 100 MG PO TABS: 100.0000 mg | ORAL_TABLET | Freq: Every day | ORAL | 0 refills | Status: DC

## 2024-04-01 NOTE — Progress Notes (Signed)
 BH MD/PA/NP OP Progress Note  04/01/2024 2:11 PM Melanie Ray  MRN:  [id number]  Chief Complaint:  Chief Complaint  Patient presents with   Follow-up   HPI: 73 year old female presenting to [facility] for routine follow-up.  Patient reports that she had a great trip to [facility] over [place] Day.  Patient reports that she is still having OCD-like symptoms but is interested to know that she did not have any OCD symptoms while she was distracted and having a good time in [place].  Patient reports that is only when she returned back from the trip and she was isolated in her home that she started having OCD tendencies to come back up.  Patient reports that she has been taking her medications but doubled up on her own accord again to take 100 mg of sertraline  and has been doing this for the last 5 days.  Patient has been instructed not to double up on the medication on her own and has been stated that the dosage is our determined for safety especially for being 73 years old.  Patient has been instructed there were to continue sertraline  for 2 weeks and not to change the dosage and double up on her own stating that if the 100 mg of sertraline  does not work within the next 2 weeks and she is not having any therapeutic effect we will consider another medication for OCD tendencies.  Patient reports that she continues to have anxiety and ruminating thoughts regarding the need to change close or to wash her hands and if she cannot comes in contact with water.  Patient was scheduled for therapy by this provider last week and it is reported that the therapy session did not go through that the provider never showed up to the appointment.  Patient is continuing to search for the provider as well as referrals for therapist to accept Medicare which is challenging in the [place] area.  Patient will currently take 100 mg of sertraline  for 2 weeks, she will continue her gabapentin  of 800 mg 3 times a day as well as Aricept  10 mg once a day.  Patient is agreement with treatment plan patient denies SI, HI, AVH.  No questions or concerns at this time.  The patient will follow up in 2 weeks. Visit Diagnosis:    ICD-10-CM   1. Mixed obsessional thoughts and acts  F42.2 sertraline  (ZOLOFT ) 100 MG tablet    2. Grieving  F43.21     3. Generalized anxiety disorder  F41.1 sertraline  (ZOLOFT ) 100 MG tablet      Past Psychiatric History:  Previous Psych Hospitalizations: Denies   Outpatient treatment: Currently seeing neurology for concerns of Alzheimer's disease.   Medications Current: - Aricept 10 mg once daily from neurology - Gabapentin  800 mg 3 times a day for anxiety, patient self increase the medication from 400 mg 3 times a day due to anxiety and reports no side effects and would prefer to continue - Sertraline  100mg  once daily for OCD     Medication Trials: -Fluoxetine , poor response   Suicide & Violence: -Denies SI, HI, AVH    Psychotherapy: -Recommended for CBT. Provided Therapy referral list and placed on wait list for Nellie Banas at [facility]   Legal:  -No legal issues  Past Medical History:  Past Medical History:  Diagnosis Date   ABLA (acute blood loss anemia) 10/24/2022   AICD (automatic cardioverter/defibrillator) present    Allergic rhinitis 09/25/2022   Back pain    Benign  neoplasm of colon 04/27/2008   CAD (coronary artery disease) 04/11/2016   S/p ant STEMI 5/17: LHC >> LAD proximal 80%, mid 80%, distal 50%, ostial D1 60%; LCx with LPDA lesion 30%; RCA Mild calcification with no significant stenosis in a medium caliber, nondominant RCA; LVEF is estimated at 45% with inferoapical and lateral wall akinesis >> PCI: PCI: 3.5 x 24 mm Promus DES to prox LAD, 2.5 x 12 mm Promus DES to mid LAD.   Cerebellar stroke 03/30/2020   2021 MRI - Small, old left cerebellar infarct    Chest pain    Chronic HFrEF (heart failure with reduced ejection fraction) 10/25/2022   Chronic systolic CHF (congestive  heart failure) 03/21/2016   Echo 01/30/17: Diff HK, mild focal basal septal hypertrophy, EF 30-35, mild AI, MAC, mild MR // Echo 06/08/16: Mild focal basal septal hypertrophy, EF 25-30%, diff HK, ant-septal AK, Gr 1 DD, mild AI, MAC, mild MR, PASP 37 mmHg // Echo 03/18/16: EF 30-35%, ant-septal AK, Gr 1 DD, mild MR, severe LAE.     Constipation    Diarrhea 01/02/2023   Dizziness    Dyspnea    Endotracheal tube present 10/24/2022   Essential hypertension 04/27/2008   Well-controlled today.  She will continue valsartan  80 mg twice a day, Imdur  30 mg daily, Lasix  20 mg every other day, [name]  10 mg daily, and carvedilol  12.5 mg twice daily.   Finger fracture 08/15/2021   She reports this is well-healing.  She will continue to see orthopedics.   Generalized anxiety disorder 04/27/2008   Glaucoma    Gout    History of acute anterior wall MI 03/17/2016   History of colonoscopy 04/27/2008   History of COVID-19 06/27/2022   HLD (hyperlipidemia) 07/28/2015   Check lipid panel.  She will continue Lipitor .   Hot flash, menopausal 02/07/2018   Will check with our clinical pharmacist regarding the black cohosh and primrose in this patient.   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   Iron deficiency anemia, unspecified 04/27/2008   Ischemic cardiomyopathy 10/02/2016   Joint pain    Left bundle branch block 12/03/2018   Chronic.  She will follow with cardiology.   Major depressive disorder 07/28/2015   Chronic ongoing issue.  Has worsened somewhat recently.  Notes passive SI.  Advised if she develops intent or plan to harm herself she needs to go to the emergency room.  I advised her to contact her psychiatrist to arrange for follow-up and she was willing to do so.  She does have protective factors in place including her husband, dog, and nephew.    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  She will follow with cardiology.  We will send her EKG to her psychiatrist and have CMA contact the psychiatry office to inform them that the EKG was faxed and of the results.   Rash 02/07/2018   Possibly related to folliculitis or some other undetermined cause of her rash.  She will trial over-the-counter antibiotic ointment and if not beneficial she will let us  know and we can refer her to dermatology.   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 Arlester Ladd and Dr. 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  She will continue [name]  10 mg daily.   Uterine leiomyoma 04/27/2008   Vitamin D  deficiency 09/15/2010    Past Surgical History:  Procedure Laterality Date   APPENDECTOMY     BIV ICD INSERTION CRT-D N/A 01/02/2022   Procedure: BIV ICD INSERTION CRT-D;  Surgeon: Boyce Byes, MD;  Location: [facility] INVASIVE CV LAB;  Service: Cardiovascular;  Laterality: N/A;   CARDIAC CATHETERIZATION N/A 03/17/2016   Procedure: Left Heart Cath and Coronary Angiography;  Surgeon: Arnoldo Lapping, MD;  [facility] CV LAB;  Service: Cardiovascular;  Laterality: N/A;   CARDIAC CATHETERIZATION N/A 03/17/2016   Procedure: Coronary Stent Intervention;  Surgeon: Arnoldo Lapping, MD;  Location: [facility] INVASIVE CV LAB;  Service: Cardiovascular;  Laterality: N/A;   COLONOSCOPY WITH PROPOFOL  N/A 08/13/2017   Procedure: COLONOSCOPY WITH PROPOFOL ;  Surgeon:  Luke Salaam, MD;  Location[facility] ENDOSCOPY;  Service: Gastroenterology;  Laterality: N/A;   COLONOSCOPY WITH PROPOFOL  N/A 03/29/2020   Procedure: COLONOSCOPY WITH PROPOFOL ;  Surgeon: Marnee Sink, MD;  Location: [facility] ENDOSCOPY;  Service: Endoscopy;  Laterality: N/A;   EXPLORATION POST OPERATIVE OPEN HEART N/A 10/24/2022   Procedure: EXPLORATION POST OPERATIVE OPEN HEART;  Surgeon: Melene Sportsman, MD;  Location: [facility] OR;  Service: Open Heart Surgery;  Laterality: N/A;   MITRAL VALVE REPLACEMENT N/A 10/24/2022   Procedure: MITRAL VALVE (MV) REPLACEMENT USING A 29 MM MEDTRONIC MOSAIC 310;  Surgeon: Melene Sportsman, MD;  Location: [facility] OR;  Service: Open Heart Surgery;  Laterality: N/A;   RIGHT HEART CATH N/A 10/15/2022   Procedure: RIGHT HEART CATH;  Surgeon: Arnoldo Lapping, MD;  Location: [facility] INVASIVE CV LAB;  Service: Cardiovascular;  Laterality: N/A;   RIGHT/LEFT HEART CATH AND CORONARY ANGIOGRAPHY N/A 08/02/2022   Procedure: RIGHT/LEFT HEART CATH AND CORONARY ANGIOGRAPHY;  Surgeon: Mardell Shade, MD;  Location: [facility] INVASIVE CV LAB;  Service: Cardiovascular;  Laterality: N/A;   SMALL BOWEL REPAIR     TEE WITHOUT CARDIOVERSION N/A 08/02/2022   Procedure: TRANSESOPHAGEAL ECHOCARDIOGRAM (TEE);  Surgeon: Mardell Shade, MD;  Location: [facility] ENDOSCOPY;  Service: Cardiovascular;  Laterality: N/A;   TEE WITHOUT CARDIOVERSION N/A 10/04/2022   Procedure: TRANSESOPHAGEAL ECHOCARDIOGRAM (TEE);  Surgeon: Arnoldo Lapping, MD;  Location: [facility] INVASIVE CV LAB;  Service: Cardiovascular;  Laterality: N/A;   TEE WITHOUT CARDIOVERSION N/A 10/24/2022   Procedure: TRANSESOPHAGEAL ECHOCARDIOGRAM (TEE);  Surgeon: Melene Sportsman, MD;  Location: [facility] OR;  Service: Open Heart Surgery;  Laterality: N/A;   TONSILLECTOMY     TRANSCATHETER MITRAL EDGE TO EDGE REPAIR N/A 10/04/2022   Procedure: MITRAL VALVE REPAIR;  Surgeon: Arnoldo Lapping, MD;  Location: [facility] INVASIVE CV LAB;  Service: Cardiovascular;  Laterality: N/A;   UTERINE  FIBROID SURGERY      Family Psychiatric History: Patient endorses mother having anxiety diagnosed.   Family History:  Family History  Problem Relation Age of Onset   Anxiety disorder Mother    Paranoid behavior Mother    Hypertension Mother    Dementia Mother    High Cholesterol Mother    Diabetes Mother    Hyperlipidemia Mother    Depression Mother    Hypertension Father  High Cholesterol Father    Mood Disorder Sister    Stroke Sister    Anxiety disorder Maternal Aunt    Tuberculosis Paternal Grandfather    Drug abuse Cousin     Social History:  Social History   Socioeconomic History   Marital status: Widowed    Spouse name: Dimples Probus   Number of children: 0   Years of education: 16   Highest education level: Bachelor's degree (e.g., BA, AB, BS)  Occupational History   Occupation: Retired    Comment: Runner, broadcasting/film/video  Tobacco Use   Smoking status: Former    Current packs/day: 0.00    Average packs/day: 0.5 packs/day for 25.0 years (12.5 ttl pk-yrs)    Types: Cigarettes    Start date: 04/13/1972    Quit date: 04/13/1997    Years since quitting: 26.9   Smokeless tobacco: Never  Vaping Use   Vaping status: Never Used  Substance and Sexual Activity   Alcohol use: Not Currently   Drug use: No   Sexual activity: Not Currently  Other Topics Concern   Not on file  Social History Narrative   Lives in [place] with spouse.  No children.   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   Left-handed   Lives in a two story home       Social Drivers of Health   Financial Resource Strain: Low Risk  (10/18/2023)   Received from [facility]   Overall Financial Resource Strain (CARDIA)    Difficulty of Paying Living Expenses: Not hard at all  Recent Concern: Financial Resource Strain - Medium Risk (08/08/2023)   Overall Financial Resource Strain (CARDIA)    Difficulty of Paying  Living Expenses: Somewhat hard  Food Insecurity: No Food Insecurity (10/18/2023)   Received from [facility]   Hunger Vital Sign    Worried About Running Out of Food in the Last Year: Never true    Ran Out of Food in the Last Year: Never true  Transportation Needs: No Transportation Needs (10/18/2023)   Received from [facility] - Transportation    In the past 12 months, has lack of transportation kept you from medical appointments or from getting medications?: No    Lack of Transportation (Non-Medical): No  Physical Activity: Inactive (08/08/2023)   Exercise Vital Sign    Days of Exercise per Week: 0 days    Minutes of Exercise per Session: 0 min  Stress: Stress Concern Present (08/08/2023)   [employer] of Occupational Health - Occupational Stress Questionnaire    Feeling of Stress : Very much  Social Connections: Moderately Integrated (08/08/2023)   Social Connection and Isolation Panel [NHANES]    Frequency of Communication with Friends and Family: More than three times a week    Frequency of Social Gatherings with Friends and Family: Never    Attends Religious Services: More than 4 times per year    Active Member of [employer] or Organizations: No    Attends Banker Meetings: Never    Marital Status: Married    Allergies:  Allergies  Allergen Reactions   Tetracycline Swelling   Meperidine Nausea And Vomiting    (Demerol)Nausea   Entresto  [Sacubitril -Valsartan ] Other (See Comments)    Shortness of Breath    Metabolic Disorder Labs: Lab Results  Component Value Date   HGBA1C 5.9 03/20/2023   MPG  111.15 05/07/2022   MPG 139.85 03/31/2020   No results found for: "PROLACTIN" Lab Results  Component Value Date   CHOL 129 03/20/2023   TRIG 46.0 03/20/2023   HDL 44.90 03/20/2023   CHOLHDL 3 03/20/2023   VLDL 9.2 03/20/2023   LDLCALC 75 03/20/2023   LDLCALC 83 12/19/2021   Lab Results  Component Value Date    TSH 0.79 12/28/2022   TSH 0.64 09/05/2021    Therapeutic Level Labs: No results found for: "LITHIUM" No results found for: "VALPROATE" No results found for: "CBMZ"  Current Medications: Current Outpatient Medications  Medication Sig Dispense Refill   aspirin  EC 81 MG tablet Take 1 tablet (81 mg total) by mouth daily. Swallow whole. 30 tablet 12   atorvastatin  (LIPITOR ) 80 MG tablet Take 1 tablet (80 mg total) by mouth daily. 90 tablet 3   carvedilol  (COREG ) 12.5 MG tablet Take 1 tablet (12.5 mg total) by mouth 2 (two) times daily with a meal. 180 tablet 2   dorzolamide -timolol  (COSOPT ) 22.3-6.8 MG/ML ophthalmic solution Place 1 drop into both eyes 2 (two) times daily.      fluticasone  (FLONASE ) 50 MCG/ACT nasal spray Place 1 spray into both nostrils daily. 15.8 mL 2   furosemide  (LASIX ) 40 MG tablet Take 40 mg by mouth as needed.     gabapentin  (NEURONTIN ) 400 MG capsule Take 2 capsules (800 mg total) by mouth 3 (three) times daily. 60 capsule 3   hydrOXYzine  (ATARAX ) 10 MG tablet Take 1 tablet (10 mg total) by mouth 3 (three) times daily as needed. 90 tablet 0   isosorbide  mononitrate (IMDUR ) 30 MG 24 hr tablet Take 1 tablet by mouth once daily 90 tablet 3   latanoprost  (XALATAN ) 0.005 % ophthalmic solution Place 1 drop into both eyes at bedtime.     meloxicam (MOBIC) 7.5 MG tablet Take 1 tablet by mouth 2 (two) times daily.     QUEtiapine (SEROQUEL) 200 MG tablet Take 200 mg by mouth at bedtime.     sertraline  (ZOLOFT ) 50 MG tablet Take 1 tablet (50 mg total) by mouth daily. 30 tablet 0   valsartan  (DIOVAN ) 80 MG tablet Take 1 tablet by mouth twice daily (Patient taking differently: Take 80 mg by mouth daily.) 180 tablet 0   donepezil (ARICEPT) 10 MG tablet Take 10 mg by mouth at bedtime.     No current facility-administered medications for this visit.     Musculoskeletal: Strength & Muscle Tone: within normal limits Gait & Station: normal Patient leans: N/A  Psychiatric  Specialty Exam: Review of Systems  Constitutional: Negative.   HENT: Negative.    Eyes: Negative.   Respiratory: Negative.    Cardiovascular: Negative.   Gastrointestinal: Negative.   Endocrine: Negative.   Genitourinary: Negative.   Musculoskeletal: Negative.   Skin: Negative.   Allergic/Immunologic: Negative.   Neurological: Negative.   Hematological: Negative.   Psychiatric/Behavioral:  Positive for behavioral problems and dysphoric mood. The patient is nervous/anxious.     Blood pressure 122/68, pulse 80, temperature 98.3 F (36.8 C), temperature source Temporal, height 5\' 3"  (1.6 m), weight 145 lb 9.6 oz (66 kg), SpO2 98%.Body mass index is 25.79 kg/m.  General Appearance: Well Groomed  Eye Contact:  Good  Speech:  Clear and Coherent  Volume:  Normal  Mood:  Anxious  Affect:  Congruent  Thought Process:  Coherent  Orientation:  Full (Time, Place, and Person)  Thought Content: Logical   Suicidal Thoughts:  No  Homicidal Thoughts:  No  Memory:  Immediate;   Good Recent;   Good Remote;   Good  Judgement:  Good  Insight:  Good  Psychomotor Activity:  Normal  Concentration:  Concentration: Good and Attention Span: Good  Recall:  Good  Fund of Knowledge: Good  Language: Good  Akathisia:  No  Handed:  Right  AIMS (if indicated):   Assets:  Desire for Improvement Financial Resources/Insurance Housing Social Support  ADL's:  Intact  Cognition: WNL  Sleep:  Good   Screenings: GAD-7    Flowsheet Row Office Visit from 11/13/2023 in [facility] [employer] Visit from 09/24/2023 in [facility] [employer] Visit from 08/14/2023 in [facility] [employer] Visit from 05/13/2023 in [facility] [employer] Visit from 04/22/2023 in [facility] [employer]  Total GAD-7 Score 18 0 18 18 15       [employer]     Flowsheet Row Office Visit from 03/19/2024 in [facility] Office Visit from 11/13/2023 in [facility] [employer] Visit from 09/24/2023 in [facility] [employer] Visit from 08/14/2023 in [facility] [employer] Clinical Support from 08/08/2023 in [facility] at [employer]  PHQ-2 Total Score 6 4 4 6 5   PHQ-9 Total Score 17 16 14 18 18       Flowsheet Row Office Visit from 03/19/2024 in [facility] ED from 03/14/2024 in [facility] Clinical Support from 08/08/2023 in [facility]  C-SSRS RISK CATEGORY No Risk No Risk Error: Q3, 4, or 5 should not be populated when Q2 is No        Assessment and Plan:  Assessment - Diagnosis: Mixed obsessional thoughts and acts [F42.2]  2. Grieving [F43.21]  3. Generalized anxiety disorder [F41.1]   - Progress: Patient reports that she had a good trip to [facility] over the [facility] Day holiday and noticed that while she was on the trip she did not have any OCD symptoms.  Patient believes that she has OCD symptoms whenever she is by herself and isolated in her home now that she is without her husband. - Risk Factors: Worsening symptoms, suicide risk  Plan - Medications:  Continue to take Gabapentin  at 800mg  TID for anxiety. Pt reports she increased the dose on her own, and is stating no complications of sedation. Continue Sertraline  to 100 mg once a day, for OCD, patient has been educated on the medication purpose as well as the recommendation for SSRIs with obsessive-compulsive disorder.  Patient has been educated on the side effects of jitteriness nervousness and headaches initially when starting medication and as the patient to bear within and notify the clinic should it last  longer than a week. Continue Quetiapine 200mg   once a day at night, prescribed by another provider patient was reminded of sedation nature of the medication to take her time and waking up as well as monitoring sedation in the mornings and to notify the clinician should it become an issue. - Psychotherapy: Based on evidence-based recommendations patient is recommended for cognitive behavioral therapy, patient was provided a therapy list.  Patient is also being recommended to be placed on wait list for Nellie Banas who specializes with OCD here at the [facility] office. - Education: Patient has been  educated on the evidence-based recommendations for therapy and pharmacotherapy with OCD symptoms.  As patient is identifying severe symptoms of rituals with OCD patient is recommended to start on medications while waiting for a therapy referral.  Patient has been educated on medications with medication purpose, side effects and adverse reactions.  Patient has also been educated on the need for community support and community involvement during isolation as the patient has a recent widow of 2 months ago.  Patient has been encouraged to seek social groups within her faith-based or community base groups.  Patient has also been educated on importance of following up weekly due to severe symptoms to monitor medication effectiveness as well as possible adjustments. - Follow-Up: Patient will follow up in 1 week - Referrals: Patient has been referred to outside therapy as well as being placed on the wait list for Nellie Banas here at [facility] office. - Safety Planning: The patient has been educated, if they should have suicidal thoughts with or without a plan to call 911, or go to the closest emergency department.  Pt verbalized understanding.  Pt denies firearms within the home.  Pt also agrees to call the clinic should they have worsening symptoms before the next appointment.   Patient/Guardian was advised Release of Information must be  obtained prior to any record release in order to collaborate their care with an outside provider. Patient/Guardian was advised if they have not already done so to contact the registration department to sign all necessary forms in order for us  to release information regarding their care.   Consent: Patient/Guardian gives verbal consent for treatment and assignment of benefits for services provided during this visit. Patient/Guardian expressed understanding and agreed to proceed.    Arlana Labor, NP 04/01/2024, 2:11 PM

## 2024-04-03 ENCOUNTER — Ambulatory Visit (INDEPENDENT_AMBULATORY_CARE_PROVIDER_SITE_OTHER): Payer: Medicare Other

## 2024-04-03 DIAGNOSIS — I447 Left bundle-branch block, unspecified: Secondary | ICD-10-CM

## 2024-04-03 LAB — CUP PACEART REMOTE DEVICE CHECK
Battery Remaining Longevity: 58 mo
Battery Remaining Percentage: 64 %
Battery Voltage: 2.98 V
Brady Statistic AP VP Percent: 93 %
Brady Statistic AP VS Percent: 1 %
Brady Statistic AS VP Percent: 6.2 %
Brady Statistic AS VS Percent: 1 %
Brady Statistic RA Percent Paced: 93 %
Date Time Interrogation Session: 20250530020019
HighPow Impedance: 59 Ohm
HighPow Impedance: 59 Ohm
Implantable Lead Connection Status: 753985
Implantable Lead Connection Status: 753985
Implantable Lead Connection Status: 753985
Implantable Lead Implant Date: 20230228
Implantable Lead Implant Date: 20230228
Implantable Lead Implant Date: 20230228
Implantable Lead Location: 753858
Implantable Lead Location: 753859
Implantable Lead Location: 753860
Implantable Lead Model: 3830
Implantable Pulse Generator Implant Date: 20230228
Lead Channel Impedance Value: 400 Ohm
Lead Channel Impedance Value: 430 Ohm
Lead Channel Impedance Value: 560 Ohm
Lead Channel Pacing Threshold Amplitude: 0.5 V
Lead Channel Pacing Threshold Amplitude: 0.75 V
Lead Channel Pacing Threshold Amplitude: 2.5 V
Lead Channel Pacing Threshold Pulse Width: 0.05 ms
Lead Channel Pacing Threshold Pulse Width: 0.5 ms
Lead Channel Pacing Threshold Pulse Width: 0.5 ms
Lead Channel Sensing Intrinsic Amplitude: 12 mV
Lead Channel Sensing Intrinsic Amplitude: 3 mV
Lead Channel Setting Pacing Amplitude: 0.25 V
Lead Channel Setting Pacing Amplitude: 2 V
Lead Channel Setting Pacing Amplitude: 2 V
Lead Channel Setting Pacing Pulse Width: 0.05 ms
Lead Channel Setting Pacing Pulse Width: 0.5 ms
Lead Channel Setting Sensing Sensitivity: 0.5 mV
Pulse Gen Serial Number: 8904264
Zone Setting Status: 755011

## 2024-04-05 ENCOUNTER — Ambulatory Visit: Payer: Self-pay | Admitting: Cardiology

## 2024-04-15 ENCOUNTER — Encounter: Payer: Self-pay | Admitting: Psychiatry

## 2024-04-15 ENCOUNTER — Ambulatory Visit: Admitting: Psychiatry

## 2024-04-15 VITALS — BP 122/70 | HR 77 | Temp 98.0°F | Ht 63.0 in | Wt 144.2 lb

## 2024-04-15 DIAGNOSIS — G2401 Drug induced subacute dyskinesia: Secondary | ICD-10-CM | POA: Diagnosis not present

## 2024-04-15 DIAGNOSIS — F4321 Adjustment disorder with depressed mood: Secondary | ICD-10-CM

## 2024-04-15 DIAGNOSIS — F422 Mixed obsessional thoughts and acts: Secondary | ICD-10-CM | POA: Diagnosis not present

## 2024-04-15 DIAGNOSIS — F411 Generalized anxiety disorder: Secondary | ICD-10-CM | POA: Diagnosis not present

## 2024-04-15 MED ORDER — SERTRALINE HCL 25 MG PO TABS: 25.0000 mg | ORAL_TABLET | Freq: Every day | ORAL | 0 refills | Status: DC

## 2024-04-15 NOTE — Progress Notes (Signed)
 BH MD/PA/NP OP Progress Note  04/15/2024 2:35 PM Melanie Ray  MRN:  [id number]  Chief Complaint:  Chief Complaint  Patient presents with   Follow-up   HPI: 73 year old female presents [facility] for follow-up.  Patient reports she continues to have issues with her OCD stating that she is overwhelmed by her OCD and stating that she would like to start therapy right away.  The patient was helped and try to establish therapy within her [facility] in which she applied on her phone with the provider helping guider and establishing appointment due to her challenge with technology.  Patient is also encouraged to stop sertraline  by tapering down from 75 mg for 3 days down to 50 mg for 3 days then to 25 mg for 3 days and then stop.  With the plan to start her on fluvoxamine due to poor response.  Patient states that she will not like to start medications for her mouth movement in which she is recognized to have tardive dyskinesia in her mouth movements due to Seroquel.  Based on patient's current medications patient was recommended for Ingrezza but unable to take due to being on Aricept in which the patient stated she will not stop Aricept.  Will be shared with my supervising physician.  Patient with no other questions or concerns at this time.  Patient denies SI, HI, AVH.  Patient with follow-up in 1 week in person. Visit Diagnosis:    ICD-10-CM   1. Mixed obsessional thoughts and acts  F42.2     2. Grieving  F43.21     3. Generalized anxiety disorder  F41.1     4. Tardive dyskinesia  G24.01       Past Psychiatric History:  Previous Psych Hospitalizations: Denies   Outpatient treatment: Currently seeing neurology for concerns of Alzheimer's disease.   Medications Current: - Aricept 10 mg once daily from neurology - Gabapentin  800 mg 3 times a day for anxiety, patient self increase the medication from 400 mg 3 times a day due to anxiety and reports no side effects and would prefer to  continue - Sertraline  75mg  once for 3 days, 50mg  for 3 days, 25mg  for 3 days and stop for OCD, Plan to start Fluvoxamine. -Seroquel 200mg  once daily at bedtime     Medication Trials: -Fluoxetine , poor response   Suicide & Violence: -Denies SI, HI, AVH    Psychotherapy: -Recommended for CBT. Provided Therapy referral list and placed on wait list for Nellie Banas at [facility]   Legal:  -No legal issues  Past Medical History:  Past Medical History:  Diagnosis Date   ABLA (acute blood loss anemia) 10/24/2022   AICD (automatic cardioverter/defibrillator) present    Allergic rhinitis 09/25/2022   Back pain    Benign neoplasm of colon 04/27/2008   CAD (coronary artery disease) 04/11/2016   S/p ant STEMI 5/17: LHC >> LAD proximal 80%, mid 80%, distal 50%, ostial D1 60%; LCx with LPDA lesion 30%; RCA Mild calcification with no significant stenosis in a medium caliber, nondominant RCA; LVEF is estimated at 45% with inferoapical and lateral wall akinesis >> PCI: PCI: 3.5 x 24 mm Promus DES to prox LAD, 2.5 x 12 mm Promus DES to mid LAD.   Cerebellar stroke 03/30/2020   2021 MRI - Small, old left cerebellar infarct    Chest pain    Chronic HFrEF (heart failure with reduced ejection fraction) 10/25/2022   Chronic systolic CHF (congestive heart failure) 03/21/2016   Echo 01/30/17: Diff  HK, mild focal basal septal hypertrophy, EF 30-35, mild AI, MAC, mild MR // Echo 06/08/16: Mild focal basal septal hypertrophy, EF 25-30%, diff HK, ant-septal AK, Gr 1 DD, mild AI, MAC, mild MR, PASP 37 mmHg // Echo 03/18/16: EF 30-35%, ant-septal AK, Gr 1 DD, mild MR, severe LAE.     Constipation    Diarrhea 01/02/2023   Dizziness    Dyspnea    Endotracheal tube present 10/24/2022   Essential hypertension 04/27/2008   Well-controlled today.  She will continue valsartan  80 mg twice a day, Imdur  30 mg daily, Lasix  20 mg every other day, [name]  10 mg daily, and carvedilol  12.5 mg twice daily.   Finger fracture 08/15/2021    She reports this is well-healing.  She will continue to see orthopedics.   Generalized anxiety disorder 04/27/2008   Glaucoma    Gout    History of acute anterior wall MI 03/17/2016   History of colonoscopy 04/27/2008   History of COVID-19 06/27/2022   HLD (hyperlipidemia) 07/28/2015   Check lipid panel.  She will continue Lipitor .   Hot flash, menopausal 02/07/2018   Will check with our clinical pharmacist regarding the black cohosh and primrose in this patient.   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   Iron deficiency anemia, unspecified 04/27/2008   Ischemic cardiomyopathy 10/02/2016   Joint pain    Left bundle branch block 12/03/2018   Chronic.  She will follow with cardiology.   Major depressive disorder 07/28/2015   Chronic ongoing issue.  Has worsened somewhat recently.  Notes passive SI.  Advised if she develops intent or plan to harm herself she needs to go to the emergency room.  I advised her to contact her psychiatrist to arrange for follow-up and she was willing to do so.  She does have protective factors in place including her husband, dog, and nephew.   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  She will follow with cardiology.  We will send her EKG to her psychiatrist and have CMA contact the psychiatry office to inform them that the EKG was faxed and of the results.   Rash 02/07/2018   Possibly related to folliculitis or some other undetermined cause of her rash.  She will trial over-the-counter antibiotic ointment and if not beneficial she will let us  know and we can refer her to  dermatology.   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 Arlester Ladd and Dr. 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  She will continue [name]  10 mg daily.   Uterine leiomyoma 04/27/2008   Vitamin D  deficiency 09/15/2010    Past Surgical History:  Procedure Laterality Date   APPENDECTOMY     BIV ICD INSERTION CRT-D N/A 01/02/2022   Procedure: BIV ICD INSERTION CRT-D;  Surgeon: Boyce Byes, MD;  Location: [facility] INVASIVE CV LAB;  Service:  Cardiovascular;  Laterality: N/A;   CARDIAC CATHETERIZATION N/A 03/17/2016   Procedure: Left Heart Cath and Coronary Angiography;  Surgeon: Arnoldo Lapping, MD;  Location: [facility] INVASIVE CV LAB;  Service: Cardiovascular;  Laterality: N/A;   CARDIAC CATHETERIZATION N/A 03/17/2016   Procedure: Coronary Stent Intervention;  Surgeon: Arnoldo Lapping, MD;  Location: [facility] INVASIVE CV LAB;  Service: Cardiovascular;  Laterality: N/A;   COLONOSCOPY WITH PROPOFOL  N/A 08/13/2017   Procedure: COLONOSCOPY WITH PROPOFOL ;  Surgeon: Luke Salaam, MD;  Location: [facility] ENDOSCOPY;  Service: Gastroenterology;  Laterality: N/A;   COLONOSCOPY WITH PROPOFOL  N/A 03/29/2020   Procedure: COLONOSCOPY WITH PROPOFOL ;  Surgeon: Marnee Sink, MD;  Location: [facility] ENDOSCOPY;  Service: Endoscopy;  Laterality: N/A;   EXPLORATION POST OPERATIVE OPEN HEART N/A 10/24/2022   Procedure: EXPLORATION POST OPERATIVE OPEN HEART;  Surgeon: Melene Sportsman, MD;  Location: [facility] OR;  Service: Open Heart Surgery;  Laterality: N/A;   MITRAL VALVE REPLACEMENT N/A 10/24/2022   Procedure: MITRAL VALVE (MV) REPLACEMENT USING A 29 MM MEDTRONIC MOSAIC 310;  Surgeon: Melene Sportsman, MD;  Location: [facility] OR;  Service: Open Heart Surgery;  Laterality:  N/A;   RIGHT HEART CATH N/A 10/15/2022   Procedure: RIGHT HEART CATH;  Surgeon: Arnoldo Lapping, MD;  Location: [facility] INVASIVE CV LAB;  Service: Cardiovascular;  Laterality: N/A;   RIGHT/LEFT HEART CATH AND CORONARY ANGIOGRAPHY N/A 08/02/2022   Procedure: RIGHT/LEFT HEART CATH AND CORONARY ANGIOGRAPHY;  Surgeon: Mardell Shade, MD;  Location: [facility] INVASIVE CV LAB;  Service: Cardiovascular;  Laterality: N/A;   SMALL BOWEL REPAIR     TEE WITHOUT CARDIOVERSION N/A 08/02/2022   Procedure: TRANSESOPHAGEAL ECHOCARDIOGRAM (TEE);  Surgeon: Mardell Shade, MD;  Location: [facility] ENDOSCOPY;  Service: Cardiovascular;  Laterality: N/A;   TEE WITHOUT CARDIOVERSION N/A 10/04/2022   Procedure: TRANSESOPHAGEAL ECHOCARDIOGRAM (TEE);  Surgeon: Arnoldo Lapping, MD;  Location: [facility] INVASIVE CV LAB;  Service: Cardiovascular;  Laterality: N/A;   TEE WITHOUT CARDIOVERSION N/A 10/24/2022   Procedure: TRANSESOPHAGEAL ECHOCARDIOGRAM (TEE);  Surgeon: Melene Sportsman, MD;  Location: [facility] OR;  Service: Open Heart Surgery;  Laterality: N/A;   TONSILLECTOMY     TRANSCATHETER MITRAL EDGE TO EDGE REPAIR N/A 10/04/2022   Procedure: MITRAL VALVE REPAIR;  Surgeon: Arnoldo Lapping, MD;  Location: [facility] INVASIVE CV LAB;  Service: Cardiovascular;  Laterality: N/A;   UTERINE FIBROID SURGERY      Family Psychiatric History: No additional  Family History:  Family History  Problem Relation Age of Onset   Anxiety disorder Mother    Paranoid behavior Mother    Hypertension Mother    Dementia Mother    High Cholesterol Mother    Diabetes Mother    Hyperlipidemia Mother    Depression Mother    Hypertension Father    High Cholesterol Father    Mood Disorder Sister    Stroke Sister    Anxiety disorder Maternal Aunt    Tuberculosis Paternal Grandfather    Drug abuse Cousin     Social History:  Social History   Socioeconomic History   Marital status: Widowed    Spouse name: Kawanna Christley   Number of children: 0   Years of  education: 16   Highest education level: Bachelor's degree (e.g., BA, AB, BS)  Occupational History   Occupation: Retired    Comment: Runner, broadcasting/film/video  Tobacco Use   Smoking status: Former    Current packs/day: 0.00    Average packs/day: 0.5 packs/day for 25.0 years (12.5 ttl pk-yrs)    Types: Cigarettes  Start date: 04/13/1972    Quit date: 04/13/1997    Years since quitting: 27.0   Smokeless tobacco: Never  Vaping Use   Vaping status: Never Used  Substance and Sexual Activity   Alcohol use: Not Currently   Drug use: No   Sexual activity: Not Currently  Other Topics Concern   Not on file  Social History Narrative   Lives in [place] with spouse.  No children.   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   Left-handed   Lives in a two story home       Social Drivers of Health   Financial Resource Strain: Low Risk  (10/18/2023)   Received from [facility]   Overall Financial Resource Strain (CARDIA)    Difficulty of Paying Living Expenses: Not hard at all  Recent Concern: Financial Resource Strain - Medium Risk (08/08/2023)   Overall Financial Resource Strain (CARDIA)    Difficulty of Paying Living Expenses: Somewhat hard  Food Insecurity: No Food Insecurity (10/18/2023)   Received from [facility]   Hunger Vital Sign    Worried About Running Out of Food in the Last Year: Never true    Ran Out of Food in the Last Year: Never true  Transportation Needs: No Transportation Needs (10/18/2023)   Received from [facility] - Transportation    In the past 12 months, has lack of transportation kept you from medical appointments or from getting medications?: No    Lack of Transportation (Non-Medical): No  Physical Activity: Inactive (08/08/2023)   Exercise Vital Sign    Days of Exercise per Week: 0 days    Minutes of Exercise per Session: 0 min  Stress:  Stress Concern Present (08/08/2023)   [employer] of Occupational Health - Occupational Stress Questionnaire    Feeling of Stress : Very much  Social Connections: Moderately Integrated (08/08/2023)   Social Connection and Isolation Panel [NHANES]    Frequency of Communication with Friends and Family: More than three times a week    Frequency of Social Gatherings with Friends and Family: Never    Attends Religious Services: More than 4 times per year    Active Member of [employer] or Organizations: No    Attends Banker Meetings: Never    Marital Status: Married    Allergies:  Allergies  Allergen Reactions   Tetracycline Swelling   Meperidine Nausea And Vomiting    (Demerol)Nausea   Entresto  [Sacubitril -Valsartan ] Other (See Comments)    Shortness of Breath    Metabolic Disorder Labs: Lab Results  Component Value Date   HGBA1C 5.9 03/20/2023   MPG 111.15 05/07/2022   MPG 139.85 03/31/2020   No results found for: PROLACTIN Lab Results  Component Value Date   CHOL 129 03/20/2023   TRIG 46.0 03/20/2023   HDL 44.90 03/20/2023   CHOLHDL 3 03/20/2023   VLDL 9.2 03/20/2023   LDLCALC 75 03/20/2023   LDLCALC 83 12/19/2021   Lab Results  Component Value Date   TSH 0.79 12/28/2022   TSH 0.64 09/05/2021    Therapeutic Level Labs: No results found for: LITHIUM No results found for: VALPROATE No results found for: CBMZ  Current Medications: Current Outpatient Medications  Medication Sig Dispense Refill   aspirin  EC 81 MG tablet Take 1 tablet (81 mg total) by mouth daily. Swallow whole. 30 tablet 12  atorvastatin  (LIPITOR ) 80 MG tablet Take 1 tablet (80 mg total) by mouth daily. 90 tablet 3   carvedilol  (COREG ) 12.5 MG tablet Take 1 tablet (12.5 mg total) by mouth 2 (two) times daily with a meal. 180 tablet 2   dorzolamide -timolol  (COSOPT ) 22.3-6.8 MG/ML ophthalmic solution Place 1 drop into both eyes 2 (two) times daily.      fluticasone  (FLONASE ) 50  MCG/ACT nasal spray Place 1 spray into both nostrils daily. 15.8 mL 2   furosemide  (LASIX ) 40 MG tablet Take 40 mg by mouth as needed.     gabapentin  (NEURONTIN ) 400 MG capsule Take 2 capsules (800 mg total) by mouth 3 (three) times daily. 60 capsule 3   hydrOXYzine  (ATARAX ) 10 MG tablet Take 1 tablet (10 mg total) by mouth 3 (three) times daily as needed. 90 tablet 0   isosorbide  mononitrate (IMDUR ) 30 MG 24 hr tablet Take 1 tablet by mouth once daily 90 tablet 3   latanoprost  (XALATAN ) 0.005 % ophthalmic solution Place 1 drop into both eyes at bedtime.     meloxicam (MOBIC) 7.5 MG tablet Take 1 tablet by mouth 2 (two) times daily.     QUEtiapine (SEROQUEL) 200 MG tablet Take 200 mg by mouth at bedtime.     sertraline  (ZOLOFT ) 100 MG tablet Take 1 tablet (100 mg total) by mouth daily. 30 tablet 0   valsartan  (DIOVAN ) 80 MG tablet Take 1 tablet by mouth twice daily (Patient taking differently: Take 80 mg by mouth daily.) 180 tablet 0   donepezil (ARICEPT) 10 MG tablet Take 10 mg by mouth at bedtime.     No current facility-administered medications for this visit.     Musculoskeletal: Strength & Muscle Tone: within normal limits Gait & Station: normal Patient leans: N/A  Psychiatric Specialty Exam: Review of Systems  Constitutional: Negative.   HENT: Negative.    Eyes: Negative.   Respiratory: Negative.    Cardiovascular: Negative.   Gastrointestinal: Negative.   Endocrine: Negative.   Genitourinary: Negative.   Musculoskeletal: Negative.   Skin: Negative.   Allergic/Immunologic: Negative.   Neurological: Negative.   Hematological: Negative.   Psychiatric/Behavioral:  Positive for dysphoric mood. The patient is nervous/anxious.     Blood pressure 122/70, pulse 77, temperature 98 F (36.7 C), temperature source Temporal, height 5' 3 (1.6 m), weight 144 lb 3.2 oz (65.4 kg), SpO2 98%.Body mass index is 25.54 kg/m.  General Appearance: Well Groomed  Eye Contact:  Good  Speech:   Clear and Coherent  Volume:  Normal  Mood:  Anxious and Depressed  Affect:  Appropriate  Thought Process:  Coherent  Orientation:  Full (Time, Place, and Person)  Thought Content: Logical   Suicidal Thoughts:  No  Homicidal Thoughts:  No  Memory:  Immediate;   Good Recent;   Good Remote;   Good  Judgement:  Good  Insight:  Good  Psychomotor Activity:  Normal  Concentration:  Concentration: Good and Attention Span: Good  Recall:  Good  Fund of Knowledge: Good  Language: Good  Akathisia:  No  Handed:  Right  AIMS (if indicated):   Assets:  Desire for Improvement Financial Resources/Insurance Housing  ADL's:  Intact  Cognition: WNL  Sleep:  Good   Screenings: GAD-7    Flowsheet Row Office Visit from 04/01/2024 in [facility] Visit from 11/13/2023 in [facility] [employer] Visit from 09/24/2023 in [facility] [employer] Visit  from 08/14/2023 in [facility] [employer] Visit from 05/13/2023 in [facility] [employer]  Total GAD-7 Score 19 18 0 18 18      PHQ2-9    Flowsheet Row Office Visit from 04/01/2024 in [facility] Office Visit from 03/19/2024 in [facility] Office Visit from 11/13/2023 in [facility] Visit from 09/24/2023 in [facility] [employer] Visit from 08/14/2023 in [facility] at [employer]  PHQ-2 Total Score 6 6 4 4 6   PHQ-9 Total Score 17 17 16 14 18       Flowsheet Row Office Visit from 04/01/2024 in [facility] Office Visit from 03/19/2024 in [facility] ED from 03/14/2024 in [facility] Department at [facility]  C-SSRS RISK CATEGORY Moderate Risk No Risk No Risk        Assessment and Plan:  Assessment and Plan:  Assessment - Diagnosis: Mixed obsessional thoughts and acts [F42.2]  2. Grieving [F43.21]  3. Generalized anxiety disorder [F41.1]   - Progress: Patient reports that she had a good trip to [facility] over the [facility] Day holiday and noticed that while she was on the trip she did not have any OCD symptoms.  Patient believes that she has OCD symptoms whenever she is by herself and isolated in her home now that she is without her husband. - Risk Factors: Worsening symptoms, suicide risk  Plan - Medications:  Continue to take Gabapentin  at 800mg  TID for anxiety. Pt reports she increased the dose on her own, and is stating no complications of sedation. Continue Sertraline  to 100 mg once a day, for OCD, patient has been educated on the medication purpose as well as the recommendation for SSRIs with obsessive-compulsive disorder.  Patient has been educated on the side effects of jitteriness nervousness and headaches initially when starting medication and as the patient to bear within and notify the clinic should it last longer than a week. Continue Quetiapine 200mg   once a day at night, prescribed by another provider patient was reminded of sedation nature of the medication to take her time and waking up as well as monitoring sedation in the mornings and to notify the clinician should it become an issue. Start Ingrezza 40mg  once daily for symptoms of tardive dyskenesia - Psychotherapy: Based on evidence-based recommendations patient is recommended for cognitive behavioral therapy, patient was provided a therapy list.  Patient is also being recommended to be placed on wait list for Nellie Banas who specializes with OCD here at the [facility] office. - Education: Patient has been educated on the evidence-based recommendations for therapy and pharmacotherapy with OCD symptoms.  As patient is  identifying severe symptoms of rituals with OCD patient is recommended to start on medications while waiting for a therapy referral.  Patient has been educated on medications with medication purpose, side effects and adverse reactions.  Patient has also been educated on the need for community support and community involvement during isolation as the patient has a recent widow of 2 months ago.  Patient has been encouraged to seek social groups within her faith-based or community base groups.  Patient has also been educated on importance of following up weekly due to severe symptoms to monitor medication effectiveness as well as possible adjustments. - Follow-Up: Patient will follow up in 1 week - Referrals:  Patient has been referred to outside therapy as well as being placed on the wait list for Nellie Banas here at [facility] office. - Safety Planning: The patient has been educated, if they should have suicidal thoughts with or without a plan to call 911, or go to the closest emergency department.  Pt verbalized understanding.  Pt denies firearms within the home.  Pt also agrees to call the clinic should they have worsening symptoms before the next appointment.   Patient/Guardian was advised Release of Information must be obtained prior to any record release in order to collaborate their care with an outside provider. Patient/Guardian was advised if they have not already done so to contact the registration department to sign all necessary forms in order for us  to release information regarding their care.   Consent: Patient/Guardian gives verbal consent for treatment and assignment of benefits for services provided during this visit. Patient/Guardian expressed understanding and agreed to proceed.    Arlana Labor, NP 04/15/2024, 2:35 PM

## 2024-04-22 ENCOUNTER — Ambulatory Visit: Admitting: Psychiatry

## 2024-04-22 ENCOUNTER — Encounter: Payer: Self-pay | Admitting: Psychiatry

## 2024-04-22 VITALS — BP 122/70 | HR 86 | Temp 98.8°F | Ht 63.0 in | Wt 141.4 lb

## 2024-04-22 DIAGNOSIS — F411 Generalized anxiety disorder: Secondary | ICD-10-CM

## 2024-04-22 DIAGNOSIS — F422 Mixed obsessional thoughts and acts: Secondary | ICD-10-CM

## 2024-04-22 DIAGNOSIS — F4321 Adjustment disorder with depressed mood: Secondary | ICD-10-CM | POA: Diagnosis not present

## 2024-04-22 DIAGNOSIS — G2401 Drug induced subacute dyskinesia: Secondary | ICD-10-CM

## 2024-04-22 MED ORDER — FLUVOXAMINE MALEATE 25 MG PO TABS
25.0000 mg | ORAL_TABLET | Freq: Every day | ORAL | 0 refills | Status: DC
Start: 2024-04-22 — End: 2024-05-13

## 2024-04-22 NOTE — Progress Notes (Signed)
 BH MD/PA/NP OP Progress Note  04/22/2024 2:35 PM Melanie Ray  MRN:  [id number]  Chief Complaint:  Chief Complaint  Patient presents with   Follow-up   HPI: 73 year old female presenting to [facility] for follow-up.  Patient reports that she is looking forward to therapy and stating that she has not started yet but has completed her intake.  Patient reports that she is still feeling overwhelmed and isolated stating that the obsessive actions and compulsions are overwhelming and she feels like the depression anxiety is increased due to the fact that whenever what water touches her she must change her close or figure out how to address having water touch her.  Patient states that this is still a significant stress and reports that she needs medication as sertraline  did not work for her.  Based on this assessment patient has been tapering off of sertraline  for the last week and it is now going to start on for Luvox starting at 25 mg once daily for the next month.  Patient has been advised to only take the medication as directed and must follow instructions and not double up on those on her own accord.  Patient has shown tendencies of being overaggressive the medication that she has been reminded that she is 73 years old and is significant weight but providers instructions and increasing medications.  Patient is in agreement and states she understands this instruction.  Patient has been educated on the medication of the purpose as well as side effects and adverse reactions.  Patient is also pending therapy with her [facility] in which she should be starting shortly on Monday.  Patient was assisted with work on headway with this provider to help her and establish an appointment.  Patient no other questions or concerns at this time patient is in agreement with treatment plan patient will follow up in 1 month and focus on therapy for the next month.  Patient to continue recommended treatment plan by  therapist. Visit Diagnosis:    ICD-10-CM   1. Mixed obsessional thoughts and acts  F42.2 fluvoxaMINE (LUVOX) 25 MG tablet    2. Grieving  F43.21     3. Generalized anxiety disorder  F41.1     4. Tardive dyskinesia  G24.01       Past Psychiatric History:  Previous Psych Hospitalizations: Denies   Outpatient treatment: Currently seeing neurology for concerns of Alzheimer's disease.   Medications Current: - Aricept 10 mg once daily from neurology - Gabapentin  800 mg 3 times a day for anxiety, patient self increase the medication from 400 mg 3 times a day due to anxiety and reports no side effects and would prefer to continue - Fluvox 25mg  once daily. -Seroquel 200mg  once daily at bedtime     Medication Trials: -Fluoxetine , poor response   Suicide & Violence: -Denies SI, HI, AVH    Psychotherapy: -Recommended for CBT. Provided Therapy referral list and placed on wait list for Nellie Banas at [facility]   Legal:  -No legal issues    Past Medical History:  Past Medical History:  Diagnosis Date   ABLA (acute blood loss anemia) 10/24/2022   AICD (automatic cardioverter/defibrillator) present    Allergic rhinitis 09/25/2022   Back pain    Benign neoplasm of colon 04/27/2008   CAD (coronary artery disease) 04/11/2016   S/p ant STEMI 5/17: LHC >> LAD proximal 80%, mid 80%, distal 50%, ostial D1 60%; LCx with LPDA lesion 30%; RCA Mild calcification with no significant stenosis  in a medium caliber, nondominant RCA; LVEF is estimated at 45% with inferoapical and lateral wall akinesis >> PCI: PCI: 3.5 x 24 mm Promus DES to prox LAD, 2.5 x 12 mm Promus DES to mid LAD.   Cerebellar stroke 03/30/2020   2021 MRI - Small, old left cerebellar infarct    Chest pain    Chronic HFrEF (heart failure with reduced ejection fraction) 10/25/2022   Chronic systolic CHF (congestive heart failure) 03/21/2016   Echo 01/30/17: Diff HK, mild focal basal septal hypertrophy, EF 30-35, mild AI, MAC, mild MR //  Echo 06/08/16: Mild focal basal septal hypertrophy, EF 25-30%, diff HK, ant-septal AK, Gr 1 DD, mild AI, MAC, mild MR, PASP 37 mmHg // Echo 03/18/16: EF 30-35%, ant-septal AK, Gr 1 DD, mild MR, severe LAE.     Constipation    Diarrhea 01/02/2023   Dizziness    Dyspnea    Endotracheal tube present 10/24/2022   Essential hypertension 04/27/2008   Well-controlled today.  She will continue valsartan  80 mg twice a day, Imdur  30 mg daily, Lasix  20 mg every other day, [name]  10 mg daily, and carvedilol  12.5 mg twice daily.   Finger fracture 08/15/2021   She reports this is well-healing.  She will continue to see orthopedics.   Generalized anxiety disorder 04/27/2008   Glaucoma    Gout    History of acute anterior wall MI 03/17/2016   History of colonoscopy 04/27/2008   History of COVID-19 06/27/2022   HLD (hyperlipidemia) 07/28/2015   Check lipid panel.  She will continue Lipitor .   Hot flash, menopausal 02/07/2018   Will check with our clinical pharmacist regarding the black cohosh and primrose in this patient.   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   Iron deficiency anemia, unspecified 04/27/2008   Ischemic cardiomyopathy 10/02/2016   Joint pain    Left bundle branch block 12/03/2018   Chronic.  She will follow with cardiology.   Major depressive disorder 07/28/2015   Chronic ongoing issue.  Has worsened somewhat recently.  Notes passive SI.  Advised if she develops intent or plan to harm herself she needs to go to the emergency room.  I advised her to contact her psychiatrist to arrange for follow-up and she was willing to do so.  She does have protective factors in place including her husband, dog, and nephew.   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  She will follow with cardiology.  We will send her EKG to her psychiatrist and have CMA contact the psychiatry office to inform them that the EKG was faxed and of the results.   Rash 02/07/2018   Possibly related to folliculitis or some other undetermined cause of her rash.  She will trial over-the-counter antibiotic ointment and if not beneficial she will let us  know and we can refer her to dermatology.   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 Arlester Ladd and Dr. 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  She will continue [name]  10 mg daily.   Uterine leiomyoma 04/27/2008   Vitamin D  deficiency 09/15/2010    Past Surgical History:  Procedure Laterality Date   APPENDECTOMY     BIV ICD INSERTION CRT-D N/A 01/02/2022   Procedure: BIV ICD INSERTION CRT-D;  Surgeon: Boyce Byes, MD;  Location: [facility] INVASIVE CV LAB;  Service: Cardiovascular;  Laterality: N/A;   CARDIAC CATHETERIZATION N/A 03/17/2016   Procedure: Left Heart Cath and Coronary Angiography;  Surgeon: Arnoldo Lapping, MD;  Location: [facility] INVASIVE CV LAB;  Service: Cardiovascular;  Laterality: N/A;   CARDIAC CATHETERIZATION N/A 03/17/2016   Procedure: Coronary Stent Intervention;  Surgeon: Arnoldo Lapping, MD;  Location: [facility] INVASIVE CV LAB;  Service: Cardiovascular;  Laterality: N/A;   COLONOSCOPY WITH PROPOFOL  N/A 08/13/2017   Procedure: COLONOSCOPY WITH PROPOFOL ;  Surgeon: Luke Salaam, MD;  Location: [facility] ENDOSCOPY;  Service: Gastroenterology;  Laterality: N/A;   COLONOSCOPY WITH PROPOFOL   N/A 03/29/2020   Procedure: COLONOSCOPY WITH PROPOFOL ;  Surgeon: Marnee Sink, MD;  Location: [facility] ENDOSCOPY;  Service: Endoscopy;  Laterality: N/A;   EXPLORATION POST OPERATIVE OPEN HEART N/A 10/24/2022   Procedure: EXPLORATION POST OPERATIVE OPEN HEART;  Surgeon: Melene Sportsman, MD;  Location: [facility] OR;  Service: Open Heart Surgery;  Laterality: N/A;   MITRAL VALVE REPLACEMENT N/A 10/24/2022   Procedure: MITRAL VALVE (MV) REPLACEMENT USING A 29 MM MEDTRONIC MOSAIC 310;  Surgeon: Melene Sportsman, MD;  Location: [facility] OR;  Service: Open Heart Surgery;  Laterality: N/A;   RIGHT HEART CATH N/A 10/15/2022   Procedure: RIGHT HEART CATH;  Surgeon: Arnoldo Lapping, MD;  Location: [facility] INVASIVE CV LAB;  Service: Cardiovascular;  Laterality: N/A;   RIGHT/LEFT HEART CATH AND CORONARY ANGIOGRAPHY N/A 08/02/2022   Procedure: RIGHT/LEFT HEART CATH AND CORONARY ANGIOGRAPHY;  Surgeon: Mardell Shade, MD;  Location: [facility] INVASIVE CV LAB;  Service: Cardiovascular;  Laterality: N/A;   SMALL BOWEL REPAIR     TEE WITHOUT CARDIOVERSION N/A 08/02/2022   Procedure: TRANSESOPHAGEAL ECHOCARDIOGRAM (TEE);  Surgeon: Mardell Shade, MD;  Location: [facility] ENDOSCOPY;  Service: Cardiovascular;  Laterality: N/A;   TEE WITHOUT CARDIOVERSION N/A 10/04/2022   Procedure: TRANSESOPHAGEAL ECHOCARDIOGRAM (TEE);  Surgeon: Arnoldo Lapping, MD;  Location: [facility] INVASIVE CV LAB;  Service: Cardiovascular;  Laterality: N/A;   TEE WITHOUT CARDIOVERSION N/A 10/24/2022   Procedure: TRANSESOPHAGEAL ECHOCARDIOGRAM (TEE);  Surgeon: Melene Sportsman, MD;  Location: [facility] OR;  Service: Open Heart Surgery;  Laterality: N/A;   TONSILLECTOMY     TRANSCATHETER MITRAL EDGE TO EDGE REPAIR N/A 10/04/2022   Procedure: MITRAL VALVE REPAIR;  Surgeon: Arnoldo Lapping, MD;  Location: [facility] INVASIVE CV LAB;  Service: Cardiovascular;  Laterality: N/A;   UTERINE FIBROID SURGERY      Family Psychiatric History: No additional  Family History:  Family History  Problem Relation  Age of Onset   Anxiety disorder Mother    Paranoid behavior Mother    Hypertension Mother    Dementia Mother    High Cholesterol Mother    Diabetes Mother    Hyperlipidemia Mother    Depression Mother    Hypertension Father    High Cholesterol Father    Mood Disorder Sister    Stroke Sister    Anxiety disorder Maternal Aunt    Tuberculosis Paternal Grandfather    Drug abuse Cousin     Social History:  Social History  Socioeconomic History   Marital status: Widowed    Spouse name: Lourine Alberico   Number of children: 0   Years of education: 16   Highest education level: Bachelor's degree (e.g., BA, AB, BS)  Occupational History   Occupation: Retired    Comment: Runner, broadcasting/film/video  Tobacco Use   Smoking status: Former    Current packs/day: 0.00    Average packs/day: 0.5 packs/day for 25.0 years (12.5 ttl pk-yrs)    Types: Cigarettes    Start date: 04/13/1972    Quit date: 04/13/1997    Years since quitting: 27.0   Smokeless tobacco: Never  Vaping Use   Vaping status: Never Used  Substance and Sexual Activity   Alcohol use: Not Currently   Drug use: No   Sexual activity: Not Currently  Other Topics Concern   Not on file  Social History Narrative   Lives in [place] with spouse.  No children.   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   Left-handed   Lives in a two story home       Social Drivers of Health   Financial Resource Strain: Low Risk  (10/18/2023)   [facility]   Overall Financial Resource Strain (CARDIA)    Difficulty of Paying Living Expenses: Not hard at all  Recent Concern: Financial Resource Strain - Medium Risk (08/08/2023)   Overall Financial Resource Strain (CARDIA)    Difficulty of Paying Living Expenses: Somewhat hard  Food Insecurity: No Food Insecurity (10/18/2023)   [facility]   Hunger Vital Sign    Within  the past 12 months, you worried that your food would run out before you got the money to buy more.: Never true    Within the past 12 months, the food you bought just didn't last and you didn't have money to get more.: Never true  Transportation Needs: No Transportation Needs (10/18/2023)   Received from [facility] - [facility]    In the past 12 months, has lack of transportation kept you from medical appointments or from getting medications?: No    Lack of Transportation (Non-Medical): No  Physical Activity: Inactive (08/08/2023)   Exercise Vital Sign    Days of Exercise per Week: 0 days    Minutes of Exercise per Session: 0 min  Stress: Stress Concern Present (08/08/2023)   [employer] of Occupational Health - Occupational Stress Questionnaire    Feeling of Stress : Very much  Social Connections: Moderately Integrated (08/08/2023)   Social Connection and Isolation Panel    Frequency of Communication with Friends and Family: More than three times a week    Frequency of Social Gatherings with Friends and Family: Never    Attends Religious Services: More than 4 times per year    Active Member of [employer] or Organizations: No    Attends Banker Meetings: Never    Marital Status: Married    Allergies:  Allergies  Allergen Reactions   Tetracycline Swelling   Meperidine Nausea And Vomiting    (Demerol)Nausea   Entresto  [Sacubitril -Valsartan ] Other (See Comments)    Shortness of Breath    Metabolic Disorder Labs: Lab Results  Component Value Date   HGBA1C 5.9 03/20/2023   MPG 111.15 05/07/2022   MPG 139.85 03/31/2020   No results found for: PROLACTIN Lab Results  Component Value Date  CHOL 129 03/20/2023   TRIG 46.0 03/20/2023   HDL 44.90 03/20/2023   CHOLHDL 3 03/20/2023   VLDL 9.2 03/20/2023   LDLCALC 75 03/20/2023   LDLCALC 83 12/19/2021   Lab Results  Component Value Date   TSH 0.79 12/28/2022   TSH 0.64 09/05/2021     Therapeutic Level Labs: No results found for: LITHIUM No results found for: VALPROATE No results found for: CBMZ  Current Medications: Current Outpatient Medications  Medication Sig Dispense Refill   aspirin  EC 81 MG tablet Take 1 tablet (81 mg total) by mouth daily. Swallow whole. 30 tablet 12   atorvastatin  (LIPITOR ) 80 MG tablet Take 1 tablet (80 mg total) by mouth daily. 90 tablet 3   carvedilol  (COREG ) 12.5 MG tablet Take 1 tablet (12.5 mg total) by mouth 2 (two) times daily with a meal. 180 tablet 2   donepezil (ARICEPT) 10 MG tablet Take 10 mg by mouth at bedtime.     dorzolamide -timolol  (COSOPT ) 22.3-6.8 MG/ML ophthalmic solution Place 1 drop into both eyes 2 (two) times daily.      fluticasone  (FLONASE ) 50 MCG/ACT nasal spray Place 1 spray into both nostrils daily. 15.8 mL 2   furosemide  (LASIX ) 40 MG tablet Take 40 mg by mouth as needed.     gabapentin  (NEURONTIN ) 400 MG capsule Take 2 capsules (800 mg total) by mouth 3 (three) times daily. 60 capsule 3   hydrOXYzine  (ATARAX ) 10 MG tablet Take 1 tablet (10 mg total) by mouth 3 (three) times daily as needed. 90 tablet 0   isosorbide  mononitrate (IMDUR ) 30 MG 24 hr tablet Take 1 tablet by mouth once daily 90 tablet 3   latanoprost  (XALATAN ) 0.005 % ophthalmic solution Place 1 drop into both eyes at bedtime.     meloxicam (MOBIC) 7.5 MG tablet Take 1 tablet by mouth 2 (two) times daily.     QUEtiapine (SEROQUEL) 200 MG tablet Take 200 mg by mouth at bedtime.     sertraline  (ZOLOFT ) 100 MG tablet Take 1 tablet (100 mg total) by mouth daily. 30 tablet 0   sertraline  (ZOLOFT ) 25 MG tablet Take 1 tablet (25 mg total) by mouth daily. 20 tablet 0   valsartan  (DIOVAN ) 80 MG tablet Take 1 tablet by mouth twice daily (Patient taking differently: Take 80 mg by mouth daily.) 180 tablet 0   No current facility-administered medications for this visit.     Musculoskeletal: Strength & Muscle Tone: within normal limits Gait &  Station: normal Patient leans: N/A  Psychiatric Specialty Exam: Review of Systems  Constitutional: Negative.   HENT: Negative.    Eyes: Negative.   Respiratory: Negative.    Cardiovascular: Negative.   Gastrointestinal: Negative.   Endocrine: Negative.   Genitourinary: Negative.   Musculoskeletal: Negative.   Skin: Negative.   Allergic/Immunologic: Negative.   Neurological: Negative.   Hematological: Negative.   Psychiatric/Behavioral:  Positive for dysphoric mood. The patient is nervous/anxious.     Blood pressure 122/70, pulse 86, temperature 98.8 F (37.1 C), temperature source Temporal, height 5' 3 (1.6 m), weight 141 lb 6.4 oz (64.1 kg), SpO2 100%.Body mass index is 25.05 kg/m.  General Appearance: Well Groomed  Eye Contact:  Good  Speech:  Clear and Coherent  Volume:  Normal  Mood:  Anxious and Depressed  Affect:  Appropriate  Thought Process:  Coherent and Goal Directed  Orientation:  Full (Time, Place, and Person)  Thought Content: Obsessions   Suicidal Thoughts:  No  Homicidal Thoughts:  No  Memory:  Immediate;   Good Recent;   Good Remote;   Good  Judgement:  Good  Insight:  Good  Psychomotor Activity:  Normal  Concentration:  Concentration: Good and Attention Span: Good  Recall:  Good  Fund of Knowledge: Good  Language: Good  Akathisia:  No  Handed:  Right  AIMS (if indicated): done  Assets:  Desire for Improvement Financial Resources/Insurance Housing  ADL's:  Intact  Cognition: WNL  Sleep:  Good   Screenings: GAD-7    Flowsheet Row Office Visit from 04/15/2024 in [facility] Visit from 04/01/2024 in [facility] Office Visit from 11/13/2023 in [facility] [employer] Visit from 09/24/2023 in [facility] [employer] Visit from 08/14/2023 in [facility] [employer]   Total GAD-7 Score 17 19 18  0 18   PHQ2-9    Flowsheet Row Office Visit from 04/15/2024 in [facility] Office Visit from 04/01/2024 in [facility] Office Visit from 03/19/2024 in [facility] Office Visit from 11/13/2023 in [facility] [employer] Visit from 09/24/2023 in [facility] [employer]  PHQ-2 Total Score 6 6 6 4 4   PHQ-9 Total Score 16 17 17 16 14    Flowsheet Row Office Visit from 04/15/2024 in [facility] Office Visit from 04/01/2024 in [facility] Office Visit from 03/19/2024 in [facility]  C-SSRS RISK CATEGORY Moderate Risk Moderate Risk No Risk    Assessment and Plan:  Assessment - Diagnosis: Mixed obsessional thoughts and acts [F42.2]  2. Grieving [F43.21]  3. Generalized anxiety disorder [F41.1]   - Progress: Patient reports that she had a good trip to [facility] over the [facility] Day holiday and noticed that while she was on the trip she did not have any OCD symptoms.  Patient believes that she has OCD symptoms whenever she is by herself and isolated in her home now that she is without her husband. - Risk Factors: Worsening symptoms, suicide risk  Plan - Medications:  Continue to take Gabapentin  at 800mg  TID for anxiety. Pt reports she increased the dose on her own, and is stating no complications of sedation. Stop Sertraline  Start Fluvox 25mg  daily for 1 week, and then 50mg  for week 2.  Continue Quetiapine 200mg   once a day at night, prescribed by another provider patient was reminded of sedation nature of the medication to take her time and waking up as well as monitoring sedation in the mornings and to notify the clinician should it become an issue.  - Psychotherapy: Based  on evidence-based recommendations patient is recommended for cognitive behavioral therapy, patient was provided a therapy list.  Patient is also being recommended to be placed on wait list for Nellie Banas who specializes with OCD here at the [facility] office. - Education: Patient has been educated on the evidence-based recommendations for therapy and pharmacotherapy with OCD symptoms.  As patient is identifying severe symptoms of rituals with OCD patient is recommended to start on medications while waiting for a therapy referral.  Patient has been educated on medications with medication purpose, side effects and adverse reactions.  Patient has also been educated on the need for community support and community involvement during isolation as the patient has a recent widow of 2  months ago.  Patient has been encouraged to seek social groups within her faith-based or community base groups.  Patient has also been educated on importance of following up weekly due to severe symptoms to monitor medication effectiveness as well as possible adjustments. - Follow-Up: Patient will follow up in 1 week - Referrals: Patient has been referred to outside therapy as well as being placed on the wait list for Nellie Banas here at [facility] office. - Safety Planning: The patient has been educated, if they should have suicidal thoughts with or without a plan to call 911, or go to the closest emergency department.  Pt verbalized understanding.  Pt denies firearms within the home.  Pt also agrees to call the clinic should they have worsening symptoms before the next appointment.  Patient/Guardian was advised Release of Information must be obtained prior to any record release in order to collaborate their care with an outside provider. Patient/Guardian was advised if they have not already done so to contact the registration department to sign all necessary forms in order for us  to release information regarding their care.   Consent:  Patient/Guardian gives verbal consent for treatment and assignment of benefits for services provided during this visit. Patient/Guardian expressed understanding and agreed to proceed.   This office note has been dictated. This dictation was prepared using Air traffic controller. As a result, errors may occur. When identified, these errors have been corrected. While every attempt is made to correct errors during dictation, errors may still exist.   Arlana Labor, NP 04/22/2024, 2:35 PM

## 2024-04-23 ENCOUNTER — Ambulatory Visit (INDEPENDENT_AMBULATORY_CARE_PROVIDER_SITE_OTHER)

## 2024-04-23 VITALS — BP 132/64 | HR 86 | Temp 99.0°F | Ht 63.0 in | Wt 143.0 lb

## 2024-04-23 DIAGNOSIS — F39 Unspecified mood [affective] disorder: Secondary | ICD-10-CM

## 2024-04-23 DIAGNOSIS — D649 Anemia, unspecified: Secondary | ICD-10-CM

## 2024-04-23 DIAGNOSIS — E0849 Diabetes mellitus due to underlying condition with other diabetic neurological complication: Secondary | ICD-10-CM | POA: Insufficient documentation

## 2024-04-23 DIAGNOSIS — E782 Mixed hyperlipidemia: Secondary | ICD-10-CM | POA: Diagnosis not present

## 2024-04-23 DIAGNOSIS — I480 Paroxysmal atrial fibrillation: Secondary | ICD-10-CM | POA: Insufficient documentation

## 2024-04-23 DIAGNOSIS — E119 Type 2 diabetes mellitus without complications: Secondary | ICD-10-CM | POA: Insufficient documentation

## 2024-04-23 DIAGNOSIS — J309 Allergic rhinitis, unspecified: Secondary | ICD-10-CM | POA: Diagnosis not present

## 2024-04-23 DIAGNOSIS — I5022 Chronic systolic (congestive) heart failure: Secondary | ICD-10-CM | POA: Diagnosis not present

## 2024-04-23 DIAGNOSIS — Z952 Presence of prosthetic heart valve: Secondary | ICD-10-CM

## 2024-04-23 DIAGNOSIS — R0600 Dyspnea, unspecified: Secondary | ICD-10-CM

## 2024-04-23 DIAGNOSIS — I10 Essential (primary) hypertension: Secondary | ICD-10-CM | POA: Diagnosis not present

## 2024-04-23 DIAGNOSIS — Z95811 Presence of heart assist device: Secondary | ICD-10-CM | POA: Insufficient documentation

## 2024-04-23 DIAGNOSIS — Z9911 Dependence on respirator [ventilator] status: Secondary | ICD-10-CM | POA: Insufficient documentation

## 2024-04-23 HISTORY — DX: Type 2 diabetes mellitus without complications: E11.9

## 2024-04-23 LAB — COMPREHENSIVE METABOLIC PANEL WITH GFR
ALT: 15 U/L (ref 0–35)
AST: 21 U/L (ref 0–37)
Albumin: 4.1 g/dL (ref 3.5–5.2)
Alkaline Phosphatase: 108 U/L (ref 39–117)
BUN: 21 mg/dL (ref 6–23)
CO2: 24 meq/L (ref 19–32)
Calcium: 9.7 mg/dL (ref 8.4–10.5)
Chloride: 110 meq/L (ref 96–112)
Creatinine, Ser: 1.01 mg/dL (ref 0.40–1.20)
GFR: 55.04 mL/min — ABNORMAL LOW (ref >60.00)
Glucose, Bld: 70 mg/dL (ref 70–99)
Potassium: 4.1 meq/L (ref 3.5–5.1)
Sodium: 139 meq/L (ref 135–145)
Total Bilirubin: 0.4 mg/dL (ref 0.2–1.2)
Total Protein: 6.7 g/dL (ref 6.0–8.3)

## 2024-04-23 LAB — CBC
HCT: 35.3 % — ABNORMAL LOW (ref 36.0–46.0)
Hemoglobin: 11.6 g/dL — ABNORMAL LOW (ref 12.0–15.0)
MCHC: 32.9 g/dL (ref 30.0–36.0)
MCV: 81.7 fl (ref 78.0–100.0)
Platelets: 225 10*3/uL (ref 150.0–400.0)
RBC: 4.32 Mil/uL (ref 3.87–5.11)
RDW: 15.1 % (ref 11.5–15.5)
WBC: 4.3 10*3/uL (ref 4.0–10.5)

## 2024-04-23 LAB — LIPID PANEL
Cholesterol: 140 mg/dL (ref 0–200)
HDL: 45.9 mg/dL (ref >39.00)
LDL Cholesterol: 83 mg/dL (ref 0–99)
NonHDL: 94.13
Total CHOL/HDL Ratio: 3
Triglycerides: 55 mg/dL (ref 0.0–149.0)
VLDL: 11 mg/dL (ref 0.0–40.0)

## 2024-04-23 LAB — VITAMIN B12: Vitamin B-12: >1500 pg/mL — ABNORMAL HIGH (ref 211–911)

## 2024-04-23 LAB — HEMOGLOBIN A1C: Hgb A1c MFr Bld: 5.9 % (ref 4.6–6.5)

## 2024-04-23 MED ORDER — FLUTICASONE PROPIONATE 50 MCG/ACT NA SUSP: 2.0000 | Freq: Every day | NASAL | 6 refills | Status: DC

## 2024-04-23 MED ORDER — ATORVASTATIN CALCIUM 80 MG PO TABS: 80.0000 mg | ORAL_TABLET | Freq: Every day | ORAL | 3 refills | Status: AC

## 2024-04-23 NOTE — Assessment & Plan Note (Signed)
 Chronic issue.   She will continue:   carvedilol  12.5 mg twice a day,  valsartan  80 mg twice daily As recommended by cardiology.

## 2024-04-23 NOTE — Assessment & Plan Note (Signed)
-   Suspected from allergic rhinitis in the absence of fever, other pulmonary symptoms.  Has not been using Flonase  regularly.  Recommend 2 sprays of Flonase  nose nostril daily, refill sent.

## 2024-04-23 NOTE — Assessment & Plan Note (Signed)
 Established with cardiology.  Management per cardiology.  Palliative care referral made to assist with advance care directive.

## 2024-04-23 NOTE — Patient Instructions (Addendum)
-   I am referring you to palliative care to help assist with advance care directive. We also gave you form for Advance Care Directive for you to fill out. Once you are done with this form please provide us  with a copy to upload it in your chart. You will get a phone call to schedule for this appointment from Palliative care department.   - Start using nasal Flonase  2 puffs daily to help with runny nose. I sent refill to the pharmacy.   - If active suicidal thoughts: Please call 911, you should be seen in ED emergently. You can also Call 988, text [phone] or chat online with a crisis counselor.

## 2024-04-23 NOTE — Assessment & Plan Note (Addendum)
 Continue close follow up with behavioral health department ( has appointment on  04/27/24).  If active suicidal thoughts: Please call 911. You can also Call 988, text [phone] or chat online with a crisis counselor.

## 2024-04-23 NOTE — Progress Notes (Signed)
 Established Patient Office Visit TOC from Dr. Lovetta Rucks (Last OV with him was on 09/24/2023).    Subjective  Patient ID: Melanie Ray, female    DOB: 08-31-1951  Age: 73 y.o. 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), Chest pain, Chronic HFrEF (heart failure with reduced ejection fraction) (10/25/2022), Chronic systolic CHF (congestive heart failure) (03/21/2016), Constipation, Diarrhea (01/02/2023), Dizziness, Dyspnea, Endotracheal tube present (10/24/2022), Essential hypertension (04/27/2008), Finger fracture (08/15/2021), Generalized anxiety disorder (04/27/2008), Glaucoma, Gout, History of acute anterior wall MI (03/17/2016), History of colonoscopy (04/27/2008), History of COVID-19 (06/27/2022), HLD (hyperlipidemia) (04/27/2008), Hot flash, menopausal (02/07/2018), Hypercholesterolemia (04/27/2008), Hyperglycemia (10/26/2022), Hyperlipemia, ICD (implantable cardioverter-defibrillator) in place (01/02/2022), Insomnia (07/28/2015), Iron deficiency anemia, unspecified (04/27/2008), Ischemic cardiomyopathy (10/02/2016), Joint pain, Left bundle branch block (12/03/2018), Major depressive disorder (07/28/2015), Nausea, Nightmares (03/01/2020), Obsessive compulsive disorder (08/27/2018), Obstructive sleep apnea, Osteopenia (04/27/2008), Positive colorectal cancer screening using Cologuard test (03/01/2020), Postprocedural hypotension (10/24/2022), QT prolongation (12/03/2018), Rash (02/07/2018), 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  HPI 1) Mood disorder:    H/O suicide attempt, OCD, schizoaffective disorder, depressive type:  - Is established with behavioral health department. Saw  Cristal Don, NP on 04/22/24. Next appointment with them is on 04/27/24.  - Is established with [facility] psychotherapist.   - Patient reports for the last month she has been dealing with worsening OCD symptoms. Reports wants to die due to OCD. She also reports she is not going to commit suicide and is trying to get through each day. She has a dog and reports she is primary care giver of her dog and would never do anything to harm herself. She lost her husband in 12/2023. She does not have access to gun at home. She is trying to socialize more. Patient reports she does eat healthy meal. She mostly eats frozen meals.   2) Continues to have runny nose since her visit in 11/13/23.  Has not been using nasal Flonase .    3) Mild cognitive impairment:  Patient established with [facility] neurologist Dr. Walden Guise. Her last appointment with him was on 11/11/23 where she was recommended to f/u with Dr. Walden Guise in 2 months. During this visit dose of Gabapentin  was increased to 600 mg, three times a day, was recommended to continue taking Aricept 10 mg nightly and Seroquel 200 mg at bedtime. Has appointment with him on 05/12/24.   4) Multiple cardiac co morbidities:  WUJ:WJXBJ, PCI x 2 in 2017  HFrEF/ischemic cardiomyopathy Hypertension Hyperlipidemia S/P mitral valve replacement (10/2022)  Aortic regurgitation  Currently on aspirin  81 mg, atorvastatin  80 mg, carvedilol  12.5 mg twice a day, Imdur  30 mg once daily, valsartan  80 mg twice daily.  Also has as needed furosemide  40 mg for lower leg edema.  Patient denies shortness of breath, lower leg edema.  Has not been taking furosemide .  5) Diet controlled type 2  diabetes: Last HbA1c 03/20/2023 was 5.9%.  6)  Normocytic normochromic anemia CBC from 03/14/2024 with hemoglobin of 11.6.    7) Does not have advance care directive  in file.  ROS As per HPI    Objective:     BP 132/64   Pulse 86   Temp 99 F (37.2 C) (Oral)   Ht 5' 3 (1.6 m)   Wt 143 lb (64.9 kg)   SpO2 99%   BMI 25.33 kg/m      04/23/2024   11:05 AM 04/15/2024    3:59 PM 04/01/2024    7:23 PM  Depression screen PHQ 2/9  Decreased Interest 3    Down, Depressed, Hopeless 3    PHQ - 2 Score 6    Altered sleeping 0    Tired, decreased energy 3    Change in appetite 0    Feeling bad or failure about yourself  3    Trouble concentrating 1    Moving slowly or fidgety/restless 1    Suicidal thoughts 3    PHQ-9 Score 17    Difficult doing work/chores Very difficult       Information is confidential and restricted. Go to Review Flowsheets to unlock data.      04/23/2024   11:05 AM 04/15/2024    4:00 PM 04/01/2024    7:24 PM 11/13/2023    1:08 PM  GAD 7 : Generalized Anxiety Score  Nervous, Anxious, on Edge 3   3  Control/stop worrying 3   3  Worry too much - different things 3   3  Trouble relaxing 3   3  Restless 2   2  Easily annoyed or irritable 1   1  Afraid - awful might happen 3   3  Total GAD 7 Score 18   18  Anxiety Difficulty    Somewhat difficult     Information is confidential and restricted. Go to Review Flowsheets to unlock data.      04/23/2024   11:05 AM 04/15/2024    3:59 PM 04/01/2024    7:23 PM  Depression screen PHQ 2/9  Decreased Interest 3    Down, Depressed, Hopeless 3    PHQ - 2 Score 6    Altered sleeping 0    Tired, decreased energy 3    Change in appetite 0    Feeling bad or failure about yourself  3    Trouble concentrating 1    Moving slowly or fidgety/restless 1    Suicidal thoughts 3    PHQ-9 Score 17    Difficult doing work/chores Very difficult       Information is confidential and restricted. Go to Review Flowsheets to  unlock data.      04/23/2024   11:05 AM 04/15/2024    4:00 PM 04/01/2024    7:24 PM 11/13/2023    1:08 PM  GAD 7 : Generalized Anxiety Score  Nervous, Anxious, on Edge 3   3  Control/stop worrying 3   3  Worry too much - different things 3   3  Trouble relaxing 3   3  Restless 2   2  Easily annoyed or irritable 1   1  Afraid - awful might happen 3   3  Total GAD 7 Score 18   18  Anxiety Difficulty    Somewhat difficult     Information is confidential and restricted. Go to Review Flowsheets to unlock data.   SDOH Screenings   Food Insecurity: No Food Insecurity (10/18/2023)   Received from [facility]  Housing: Low Risk  (11/11/2023)   Received from [facility]  Transportation Needs: No Transportation Needs (10/18/2023)   Received from [facility]  Utilities: Not At Risk (10/18/2023)   Received from [facility]  Alcohol Screen: Low Risk  (08/08/2023)  Depression (PHQ2-9): High Risk (04/23/2024)  Financial Resource Strain: Low Risk  (10/18/2023)   Received from [facility]  Recent Concern: Financial Resource Strain - Medium Risk (08/08/2023)  Physical Activity: Inactive (08/08/2023)  Social Connections: Moderately Integrated (08/08/2023)  Stress: Stress Concern Present (08/08/2023)  Tobacco Use: Medium Risk (04/23/2024)  Health Literacy: Adequate Health Literacy (08/08/2023)     Physical Exam HENT:     Head: Normocephalic and atraumatic.     Mouth/Throat:     Mouth: Mucous membranes are moist.   Cardiovascular:     Rate and Rhythm: Normal rate.  Abdominal:     Palpations: Abdomen is soft.     Tenderness: There is no abdominal tenderness.   Musculoskeletal:     Cervical back: Neck supple.     Right lower leg: No edema.     Left lower leg: No edema.  Lymphadenopathy:     Cervical: No cervical adenopathy.   Skin:    General: Skin is warm.   Neurological:     Mental Status: She is  alert and oriented to person, place, and time.   Psychiatric:        Mood and Affect: Mood is depressed. Affect is flat. Affect is not labile.        Speech: Speech normal.        Behavior: Behavior is cooperative.        No results found for any visits on 04/23/24.  The ASCVD Risk score (Arnett DK, et al., 2019) failed to calculate for the following reasons:   Risk score cannot be calculated because patient has a medical history suggesting prior/existing ASCVD     Assessment & Plan:   Normocytic normochromic anemia Assessment & Plan: Repeat CBC, B12 Patient counseled on incorporating healthy diet (dark leafy vegetables, poultry, legumes, fortified grains for B12)   Orders: -     CBC -     Vitamin B12  Allergic rhinitis, unspecified seasonality, unspecified trigger Assessment & Plan: - Suspected from allergic rhinitis in the absence of fever, other pulmonary symptoms.  Has not been using Flonase  regularly.  Recommend 2 sprays of Flonase  nose nostril daily, refill sent.   Orders: -     Fluticasone  Propionate; Place 2 sprays into both nostrils daily.  Dispense: 16 g; Refill: 6  Mixed hyperlipidemia Assessment & Plan: Check lipid panel.  Continue Lipitor  80 mg daily. Refill sent.   Orders: -     Lipid panel -     Comprehensive metabolic panel with GFR -     Atorvastatin  Calcium ; Take 1 tablet (80 mg total) by mouth daily.  Dispense: 90 tablet; Refill: 3  Diet-controlled type 2 diabetes mellitus (HCC) Assessment & Plan: Diet controlled.  Check A1c.   Orders: -     Hemoglobin A1c  Chronic systolic CHF (congestive heart failure) Assessment & Plan: Established with cardiology.  Management per cardiology.  Palliative care referral made to assist with advance care directive.  Orders: -     Amb Referral to Palliative Care  S/P mitral valve replacement Assessment & Plan: Management per cardiology. Does not have Advance Care Planning in file. Patient provided with  ACP form and referral to palliative care made to help assist in ACP.   Orders: -  Amb Referral to Palliative Care  Mood disorder [facility]) Assessment & Plan: Continue close follow up with behavioral health department ( has appointment on  04/27/24).  If active suicidal thoughts: Please call [phone]. You can also Call [phone], text [phone] or chat online with a crisis counselor.     Essential hypertension Assessment & Plan: Chronic issue.   She will continue:   carvedilol  12.5 mg twice a day,  valsartan  80 mg twice daily As recommended by cardiology.     I spent 40 minutes on the day of this face-to-face encounter reviewing the patient's medical and surgical history, medications, ongoing concerns, and reviewing the assessment and plan with the patient. This time also included counseling the patient on their health conditions and management options. Additionally, I spent time post-visit ordering and reviewing diagnostics and therapeutics with the patient.  Return in about 3 months (around 07/24/2024) for Chronic .   Jacklin Mascot, MD

## 2024-04-23 NOTE — Assessment & Plan Note (Signed)
 Diet controlled.  Check A1c.

## 2024-04-23 NOTE — Assessment & Plan Note (Signed)
 Repeat CBC, B12 Patient counseled on incorporating healthy diet (dark leafy vegetables, poultry, legumes, fortified grains for B12)

## 2024-04-23 NOTE — Assessment & Plan Note (Addendum)
 Check lipid panel.  Continue Lipitor  80 mg daily. Refill sent.

## 2024-04-23 NOTE — Assessment & Plan Note (Signed)
 Management per cardiology. Does not have Advance Care Planning in file. Patient provided with ACP form and referral to palliative care made to help assist in ACP.

## 2024-04-24 ENCOUNTER — Ambulatory Visit: Payer: Self-pay

## 2024-05-12 DIAGNOSIS — G3184 Mild cognitive impairment, so stated: Secondary | ICD-10-CM | POA: Diagnosis not present

## 2024-05-12 DIAGNOSIS — R2689 Other abnormalities of gait and mobility: Secondary | ICD-10-CM | POA: Diagnosis not present

## 2024-05-12 DIAGNOSIS — R42 Dizziness and giddiness: Secondary | ICD-10-CM | POA: Diagnosis not present

## 2024-05-12 DIAGNOSIS — E0849 Diabetes mellitus due to underlying condition with other diabetic neurological complication: Secondary | ICD-10-CM | POA: Diagnosis not present

## 2024-05-13 ENCOUNTER — Encounter: Payer: Self-pay | Admitting: Psychiatry

## 2024-05-13 ENCOUNTER — Ambulatory Visit: Admitting: Psychiatry

## 2024-05-13 ENCOUNTER — Other Ambulatory Visit: Payer: Self-pay

## 2024-05-13 VITALS — BP 135/68 | HR 79 | Temp 97.6°F | Ht 63.0 in | Wt 147.0 lb

## 2024-05-13 DIAGNOSIS — F422 Mixed obsessional thoughts and acts: Secondary | ICD-10-CM | POA: Diagnosis not present

## 2024-05-13 DIAGNOSIS — F411 Generalized anxiety disorder: Secondary | ICD-10-CM

## 2024-05-13 DIAGNOSIS — G2401 Drug induced subacute dyskinesia: Secondary | ICD-10-CM

## 2024-05-13 DIAGNOSIS — F4321 Adjustment disorder with depressed mood: Secondary | ICD-10-CM

## 2024-05-13 MED ORDER — FLUVOXAMINE MALEATE 50 MG PO TABS: 50.0000 mg | ORAL_TABLET | Freq: Every day | ORAL | 2 refills | Status: DC

## 2024-05-13 NOTE — Progress Notes (Signed)
 BH MD/PA/NP OP Progress Note  05/13/2024 2:04 PM Melanie Ray  MRN:  [id number]  Chief Complaint:  Chief Complaint  Patient presents with   Follow-up   HPI: 73 year old female presents [facility] for follow-up.  Patient reports today that she is feeling dizzy and sedated more than usual reporting that the Fluox has been working and she states that she has participated in OCD therapy but states that she is just does not feel any better.  Patient continues to state passive suicidality does daily whenever she is isolated.  Patient does report though during fourth July she was in [place]  with family in which she had no OCD tendencies or feelings of hopelessness or sadness.  Patient also states no suicidal ideation during this time being close to family.  Patient was asked if it was possible to live close to family in which she is not sure.  Patient does endorse and state as well that she has followed up with her neurologist and she is asking her neurologist to manage her mood disorder and anxiety .  The neurologist placed her on Depakote 250 mg twice daily.  It has been asking for she let him know that she was being managed by psychiatry in which she reported she did not.  Patient has been educated that she should only be seen by 1 provider and that having to providers risks polypharmacy in which we are trying to avoid with her being at the age of 19.  Patient verbalized understanding.  Patient is recommended to continue medication management and to follow up with Dr. Lane and to follow back up with this clinic after his recommendations are made.  To help manage OCD patient will increase fluoxetine  50 mg once daily, patient will continue to take the Seroquel 200 mg once daily at her preference to help her with sleep and for mood stability.  Patient also to continue gabapentin  2 times a day decreased by the neurologist for anxiety.  Inpatient did continue recommendations by Dr. Lane for Depakote 250 mg  twice a day.  Patient was educated on the risk of the polypharmacy and that this provider prefers her to finish up with a neurologist and to return back with an appointment whenever the doctor is done making medication changes.  Patient also educated that the patient is being targeted for OCD tendencies and participating in therapy that was established by for her.  Patient is in agreement and states that she will follow with Dr. Lane for the Depakote prescription and I will follow back up once the provider is done making medication changes.  Patient with no other questions or concerns at this time patient is in agreement with treatment plan.  Patient currently denies SI, HI, AVH.  Patient does have passive suicidality and states that she does not have a plan.  Patient has been educated to call 911 or go to emergency department if she develops a plan.  Patient lists protective factors of her dog as well as her family members and friends.  Patient has been encouraged that if she has a crisis or needs to talk to someone she can set up an appointment prior to her next follow-up with Dr. Lane. and she can always call the clinic.  Chart has been reviewed from [place] [facility].  I personally spent a total of 30 minutes in the care of the patient today including preparing to see the patient, getting/reviewing separately obtained history, performing a medically appropriate exam/evaluation, counseling and educating,  and placing orders.  Visit Diagnosis:    ICD-10-CM   1. Mixed obsessional thoughts and acts  F42.2     2. Grieving  F43.21     3. Generalized anxiety disorder  F41.1     4. Tardive dyskinesia  G24.01       Past Psychiatric History:  Previous Psych Hospitalizations: Denies   Outpatient treatment: Currently seeing neurology for concerns of Alzheimer's disease.   Medications Current: - Aricept 10 mg once daily from neurology - Gabapentin  800 mg 3 times a day for anxiety, patient self increase  the medication from 400 mg 3 times a day due to anxiety and reports no side effects and would prefer to continue - Fluvox 50mg  once daily. -Seroquel 200mg  once daily at bedtime -Depakote 250mg  BID, prescribed by Dr. Lane. Pt asked to followup with Dr. Lane and to return once med management is completed by neurologist.      Medication Trials: -Fluoxetine , poor response   Suicide & Violence: -Denies SI, HI, AVH    Psychotherapy: -Participating in monthly therapy with [facility]. Provided Therapy referral list and placed on wait list for Almarie at [facility]   Legal:  -No legal issues  Past Medical History:  Past Medical History:  Diagnosis Date   ABLA (acute blood loss anemia) 10/24/2022   AICD (automatic cardioverter/defibrillator) present    Allergic rhinitis 09/25/2022   Back pain    Benign neoplasm of colon 04/27/2008   CAD (coronary artery disease) 04/11/2016   S/p ant STEMI 5/17: LHC >> LAD proximal 80%, mid 80%, distal 50%, ostial D1 60%; LCx with LPDA lesion 30%; RCA Mild calcification with no significant stenosis in a medium caliber, nondominant RCA; LVEF is estimated at 45% with inferoapical and lateral wall akinesis >> PCI: PCI: 3.5 x 24 mm Promus DES to prox LAD, 2.5 x 12 mm Promus DES to mid LAD.   Cerebellar stroke 03/30/2020   2021 MRI - Small, old left cerebellar infarct    Chest pain    Chronic HFrEF (heart failure with reduced ejection fraction) 10/25/2022   Chronic systolic CHF (congestive heart failure) 03/21/2016   Echo 01/30/17: Diff HK, mild focal basal septal hypertrophy, EF 30-35, mild AI, MAC, mild MR // Echo 06/08/16: Mild focal basal septal hypertrophy, EF 25-30%, diff HK, ant-septal AK, Gr 1 DD, mild AI, MAC, mild MR, PASP 37 mmHg // Echo 03/18/16: EF 30-35%, ant-septal AK, Gr 1 DD, mild MR, severe LAE.     Constipation    Diarrhea 01/02/2023   Dizziness    Dyspnea    Endotracheal tube present 10/24/2022   Essential hypertension 04/27/2008    Well-controlled today.  She will continue valsartan  80 mg twice a day, Imdur  30 mg daily, Lasix  20 mg every other day, [name]  10 mg daily, and carvedilol  12.5 mg twice daily.   Finger fracture 08/15/2021   She reports this is well-healing.  She will continue to see orthopedics.   Generalized anxiety disorder 04/27/2008   Glaucoma    Gout    History of acute anterior wall MI 03/17/2016   History of colonoscopy 04/27/2008   History of COVID-19 06/27/2022   HLD (hyperlipidemia) 04/27/2008   Check lipid panel.  She will continue Lipitor .   Hot flash, menopausal 02/07/2018   Will check with our clinical pharmacist regarding the black cohosh and primrose in this patient.   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   Iron deficiency anemia, unspecified 04/27/2008   Ischemic cardiomyopathy 10/02/2016   Joint pain    Left bundle branch block 12/03/2018   Chronic.  She will follow with cardiology.   Major depressive disorder 07/28/2015   Chronic ongoing issue.  Has worsened somewhat recently.  Notes passive SI.  Advised if she develops intent or plan to harm herself she needs to go to the emergency room.  I advised her to contact her psychiatrist to arrange for follow-up and she was willing to do so.  She does have protective factors in place including her husband, dog, and nephew.   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  She will follow with cardiology.  We will send her EKG to her psychiatrist and have CMA contact the psychiatry office to inform them that the EKG was faxed and of the results.   Rash  02/07/2018   Possibly related to folliculitis or some other undetermined cause of her rash.  She will trial over-the-counter antibiotic ointment and if not beneficial she will let us  know and we can refer her to dermatology.   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 Wonda and Dr. 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  She will continue [name]  10 mg daily.   Uterine leiomyoma 04/27/2008   Vitamin D  deficiency 09/15/2010    Past Surgical History:  Procedure Laterality Date   APPENDECTOMY     BIV ICD INSERTION CRT-D N/A 01/02/2022   Procedure: BIV ICD INSERTION CRT-D;  Surgeon: Cindie Ole DASEN, MD;  Location[facility] INVASIVE CV LAB;  Service: Cardiovascular;  Laterality: N/A;   CARDIAC CATHETERIZATION N/A 03/17/2016   Procedure: Left Heart Cath and Coronary Angiography;  Surgeon: Ozell Wonda, MD;  Location: [facility] INVASIVE CV LAB;  Service: Cardiovascular;  Laterality: N/A;   CARDIAC CATHETERIZATION N/A 03/17/2016   Procedure: Coronary Stent Intervention;  Surgeon: Ozell Wonda, MD;  Location: [facility] INVASIVE CV LAB;  Service: Cardiovascular;  Laterality: N/A;   COLONOSCOPY WITH PROPOFOL  N/A 08/13/2017   Procedure: COLONOSCOPY WITH PROPOFOL ;  Surgeon: Therisa Bi, MD;  Location: [facility] ENDOSCOPY;  Service: Gastroenterology;  Laterality: N/A;   COLONOSCOPY WITH PROPOFOL  N/A 03/29/2020   Procedure: COLONOSCOPY WITH PROPOFOL ;  Surgeon: Jinny Carmine, MD;  Location: [facility] ENDOSCOPY;  Service: Endoscopy;  Laterality: N/A;   EXPLORATION POST OPERATIVE OPEN HEART N/A 10/24/2022   Procedure: EXPLORATION POST OPERATIVE OPEN HEART;  Surgeon: Maryjane Mt, MD;  Location: [facility] OR;  Service: Open Heart Surgery;  Laterality: N/A;    MITRAL VALVE REPLACEMENT N/A 10/24/2022   Procedure: MITRAL VALVE (MV) REPLACEMENT USING A 29 MM MEDTRONIC MOSAIC 310;  Surgeon: Maryjane Mt, MD;  Location[facility] OR;  Service: Open Heart Surgery;  Laterality: N/A;   RIGHT HEART CATH N/A 10/15/2022   Procedure: RIGHT HEART CATH;  Surgeon: Wonda Ozell, MD;  Location: [facility] INVASIVE CV LAB;  Service: Cardiovascular;  Laterality: N/A;   RIGHT/LEFT HEART CATH AND  CORONARY ANGIOGRAPHY N/A 08/02/2022   Procedure: RIGHT/LEFT HEART CATH AND CORONARY ANGIOGRAPHY;  Surgeon: Cherrie Toribio SAUNDERS, MD;  Location: [facility] INVASIVE CV LAB;  Service: Cardiovascular;  Laterality: N/A;   SMALL BOWEL REPAIR     TEE WITHOUT CARDIOVERSION N/A 08/02/2022   Procedure: TRANSESOPHAGEAL ECHOCARDIOGRAM (TEE);  Surgeon: Cherrie Toribio SAUNDERS, MD;  Location: [facility] ENDOSCOPY;  Service: Cardiovascular;  Laterality: N/A;   TEE WITHOUT CARDIOVERSION N/A 10/04/2022   Procedure: TRANSESOPHAGEAL ECHOCARDIOGRAM (TEE);  Surgeon: Wonda Sharper, MD;  Location: [facility] INVASIVE CV LAB;  Service: Cardiovascular;  Laterality: N/A;   TEE WITHOUT CARDIOVERSION N/A 10/24/2022   Procedure: TRANSESOPHAGEAL ECHOCARDIOGRAM (TEE);  Surgeon: Maryjane Mt, MD;  Location: [facility] OR;  Service: Open Heart Surgery;  Laterality: N/A;   TONSILLECTOMY     TRANSCATHETER MITRAL EDGE TO EDGE REPAIR N/A 10/04/2022   Procedure: MITRAL VALVE REPAIR;  Surgeon: Wonda Sharper, MD;  Location: [facility] INVASIVE CV LAB;  Service: Cardiovascular;  Laterality: N/A;   UTERINE FIBROID SURGERY      Family Psychiatric History: No additional  Family History:  Family History  Problem Relation Age of Onset   Anxiety disorder Mother    Paranoid behavior Mother    Hypertension Mother    Dementia Mother    High Cholesterol Mother    Diabetes Mother    Hyperlipidemia Mother    Depression Mother    Hypertension Father    High Cholesterol Father    Mood Disorder Sister    Stroke Sister    Anxiety disorder Maternal Aunt    Tuberculosis  Paternal Grandfather    Drug abuse Cousin     Social History:  Social History   Socioeconomic History   Marital status: Widowed    Spouse name: Charnele Semple   Number of children: 0   Years of education: 16   Highest education level: Bachelor's degree (e.g., BA, AB, BS)  Occupational History   Occupation: Retired    Comment: Runner, broadcasting/film/video  Tobacco Use   Smoking status: Former    Current packs/day: 0.00    Average packs/day: 0.5 packs/day for 25.0 years (12.5 ttl pk-yrs)    Types: Cigarettes    Start date: 04/13/1972    Quit date: 04/13/1997    Years since quitting: 27.1   Smokeless tobacco: Never  Vaping Use   Vaping status: Never Used  Substance and Sexual Activity   Alcohol use: Not Currently   Drug use: No   Sexual activity: Not Currently  Other Topics Concern   Not on file  Social History Narrative   Lives in [place] with spouse.  No children.   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   Left-handed   Lives in a two story home       Social Drivers of Health   Financial Resource Strain: Low Risk  (10/18/2023)   Received from [facility]   Overall Financial Resource Strain (CARDIA)    Difficulty of Paying Living Expenses: Not hard at all  Recent Concern: Financial Resource Strain - Medium Risk (08/08/2023)   Overall Financial Resource Strain (CARDIA)    Difficulty of Paying Living Expenses: Somewhat hard  Food Insecurity: No Food Insecurity (10/18/2023)   Received from [facility]   Hunger Vital Sign    Within the past 12 months, you worried that your food would run out before you got the money to buy more.: Never true  Within the past 12 months, the food you bought just didn't last and you didn't have money to get more.: Never true  Transportation Needs: No Transportation Needs (10/18/2023)   Received from [facility]  Transportation    In the past 12 months, has lack of transportation kept you from medical appointments or from getting medications?: No    Lack of Transportation (Non-Medical): No  Physical Activity: Inactive (08/08/2023)   Exercise Vital Sign    Days of Exercise per Week: 0 days    Minutes of Exercise per Session: 0 min  Stress: Stress Concern Present (08/08/2023)   [employer] of Occupational Health - Occupational Stress Questionnaire    Feeling of Stress : Very much  Social Connections: Moderately Integrated (08/08/2023)   Social Connection and Isolation Panel    Frequency of Communication with Friends and Family: More than three times a week    Frequency of Social Gatherings with Friends and Family: Never    Attends Religious Services: More than 4 times per year    Active Member of [employer] or Organizations: No    Attends Banker Meetings: Never    Marital Status: Married    Allergies:  Allergies  Allergen Reactions   Tetracycline Swelling   Meperidine Nausea And Vomiting    (Demerol)Nausea   Entresto  [Sacubitril -Valsartan ] Other (See Comments)    Shortness of Breath    Metabolic Disorder Labs: Lab Results  Component Value Date   HGBA1C 5.9 04/23/2024   MPG 111.15 05/07/2022   MPG 139.85 03/31/2020   No results found for: PROLACTIN Lab Results  Component Value Date   CHOL 140 04/23/2024   TRIG 55.0 04/23/2024   HDL 45.90 04/23/2024   CHOLHDL 3 04/23/2024   VLDL 11.0 04/23/2024   LDLCALC 83 04/23/2024   LDLCALC 75 03/20/2023   Lab Results  Component Value Date   TSH 0.79 12/28/2022   TSH 0.64 09/05/2021    Therapeutic Level Labs: No results found for: LITHIUM No results found for: VALPROATE No results found for: CBMZ  Current Medications: Current Outpatient Medications  Medication Sig Dispense Refill   aspirin  EC 81 MG tablet Take 1 tablet (81 mg total) by mouth daily. Swallow whole. 30 tablet 12   atorvastatin  (LIPITOR ) 80 MG  tablet Take 1 tablet (80 mg total) by mouth daily. 90 tablet 3   carvedilol  (COREG ) 12.5 MG tablet Take 1 tablet (12.5 mg total) by mouth 2 (two) times daily with a meal. 180 tablet 2   donepezil (ARICEPT) 10 MG tablet Take 10 mg by mouth at bedtime.     dorzolamide -timolol  (COSOPT ) 22.3-6.8 MG/ML ophthalmic solution Place 1 drop into both eyes 2 (two) times daily.      fluticasone  (FLONASE ) 50 MCG/ACT nasal spray Place 2 sprays into both nostrils daily. 16 g 6   fluvoxaMINE  (LUVOX ) 25 MG tablet Take 1 tablet (25 mg total) by mouth at bedtime. 30 tablet 0   furosemide  (LASIX ) 40 MG tablet Take 40 mg by mouth as needed.     gabapentin  (NEURONTIN ) 400 MG capsule Take 2 capsules (800 mg total) by mouth 3 (three) times daily. 60 capsule 3   hydrOXYzine  (ATARAX ) 10 MG tablet Take 1 tablet (10 mg total) by mouth 3 (three) times daily as needed. 90 tablet 0   isosorbide  mononitrate (IMDUR ) 30 MG 24 hr tablet Take 1 tablet by mouth once daily 90 tablet 3   QUEtiapine (SEROQUEL) 200 MG tablet Take 200  mg by mouth at bedtime.     valsartan  (DIOVAN ) 80 MG tablet Take 1 tablet by mouth twice daily (Patient taking differently: Take 80 mg by mouth daily.) 180 tablet 0   No current facility-administered medications for this visit.     Musculoskeletal: Strength & Muscle Tone: within normal limits Gait & Station: normal Patient leans: N/A  Psychiatric Specialty Exam: Review of Systems  Constitutional: Negative.   HENT: Negative.    Eyes: Negative.   Respiratory: Negative.    Cardiovascular: Negative.   Gastrointestinal: Negative.   Endocrine: Negative.   Genitourinary: Negative.   Musculoskeletal: Negative.   Allergic/Immunologic: Negative.   Neurological:  Positive for dizziness and weakness.  Hematological: Negative.   Psychiatric/Behavioral:  Positive for dysphoric mood and suicidal ideas. The patient is nervous/anxious.     Blood pressure 135/68, pulse 79, temperature 97.6 F (36.4 C),  temperature source Temporal, height 5' 3 (1.6 m), weight 147 lb (66.7 kg).Body mass index is 26.04 kg/m.  General Appearance: Well Groomed  Eye Contact:  Good  Speech:  Clear and Coherent  Volume:  Normal  Mood:  Anxious and Depressed  Affect:  Depressed  Thought Process:  Coherent  Orientation:  Full (Time, Place, and Person)  Thought Content: Logical   Suicidal Thoughts:  No  Homicidal Thoughts:  No  Memory:  Immediate;   Good Recent;   Good Remote;   Good  Judgement:  Good  Insight:  Good  Psychomotor Activity:  Normal  Concentration:  Concentration: Good and Attention Span: Good  Recall:  Good  Fund of Knowledge: Good  Language: Good  Akathisia:  No  Handed:  Right  AIMS (if indicated):   Assets:  Desire for Improvement Financial Resources/Insurance Housing  ADL's:  Intact  Cognition: WNL  Sleep:  Good   Screenings: GAD-7    Flowsheet Row Office Visit from 04/23/2024 in [facility] [employer] Visit from 04/15/2024 in [facility] Office Visit from 04/01/2024 in [facility] Office Visit from 11/13/2023 in [facility] [employer] Visit from 09/24/2023 in [facility] [employer]  Total GAD-7 Score 18 17 19 18  0   [employer]    Flowsheet Row Office Visit from 04/23/2024 in [facility] [employer] Visit from 04/15/2024 in [facility] Office Visit from 04/01/2024 in [facility] Office Visit from 03/19/2024 in [facility] Office Visit from 11/13/2023 in [facility] [place]  PHQ-2 Total Score 6 6 6 6 4   PHQ-9 Total Score 17 16 17 17 16    Flowsheet Row Office Visit from 04/15/2024 in [facility] Office Visit from 04/01/2024 in [facility] Office Visit from 03/19/2024 in [facility]  C-SSRS RISK CATEGORY Moderate Risk Moderate Risk No Risk     Assessment and Plan:  Assessment - Diagnosis: Mixed obsessional thoughts and acts [F42.2]  2. Grieving [F43.21]  3. Generalized anxiety disorder [F41.1]   - Progress: Patient reports that she had a good trip to [facility] over the [facility] Day holiday and noticed that while she was on the trip she did not have any OCD symptoms.  Patient believes that she has OCD symptoms whenever she is by herself and isolated in her home now that she is  without her husband. - Risk Factors: Worsening symptoms, suicide risk  Plan - Medications:  Continue to take Gabapentin  at 800mg  BID for anxiety. Pt reports she increased the dose on her own, and is stating no complications of sedation. Increase 50mg  Fluvox, once a day at bedtime for OCD.  Continue Quetiapine 200mg   once a day at night, prescribed by another provider patient was reminded of sedation nature of the medication to take her time and waking up as well as monitoring sedation in the mornings and to notify the clinician should it become an issue. Continue taking Depakote 250mg  BID, per neurology Dr. Lane, follow-up with Dr. Lane.   - Psychotherapy: Pt with [facility] for therapy.  Patient is also being recommended to be placed on wait list for [facility] who specializes with OCD here at the [facility] office. - Education: Patient has been educated on the evidence-based recommendations for therapy and pharmacotherapy with OCD symptoms.  As patient is identifying severe symptoms of rituals with OCD patient is recommended to start on medications while waiting for a therapy referral.  Patient has been educated on medications with medication purpose, side effects and adverse reactions.  Patient has also  been educated on the need for community support and community involvement during isolation as the patient has a recent widow of 2 months ago.  Patient has been encouraged to seek social groups within her faith-based or community base groups.  Patient has also been educated on importance of following up weekly due to severe symptoms to monitor medication effectiveness as well as possible adjustments. - Follow-Up: Patient to follow-up once Neurology is done with med management.  - Referrals: Patient has been referred to outside therapy as well as being placed on the wait list for Almarie here at [facility] office. - Safety Planning: The patient has been educated, if they should have suicidal thoughts with or without a plan to call 911, or go to the closest emergency department.  Pt verbalized understanding.  Pt denies firearms within the home.  Pt also agrees to call the clinic should they have worsening symptoms before the next appointment. Patient/Guardian was advised Release of Information must be obtained prior to any record release in order to collaborate their care with an outside provider. Patient/Guardian was advised if they have not already done so to contact the registration department to sign all necessary forms in order for us  to release information regarding their care.   Consent: Patient/Guardian gives verbal consent for treatment and assignment of benefits for services provided during this visit. Patient/Guardian expressed understanding and agreed to proceed.    Dorn Jama Der, NP 05/13/2024, 2:05 PM

## 2024-05-20 ENCOUNTER — Ambulatory Visit: Admitting: Psychiatry

## 2024-05-21 NOTE — Progress Notes (Signed)
 Remote ICD transmission.

## 2024-06-02 ENCOUNTER — Telehealth: Payer: Self-pay

## 2024-06-02 ENCOUNTER — Other Ambulatory Visit: Payer: Self-pay | Admitting: Psychiatry

## 2024-06-02 DIAGNOSIS — F411 Generalized anxiety disorder: Secondary | ICD-10-CM

## 2024-06-02 MED ORDER — HYDROXYZINE HCL 10 MG PO TABS: 10.0000 mg | ORAL_TABLET | Freq: Three times a day (TID) | ORAL | 0 refills | Status: DC | PRN

## 2024-06-02 NOTE — Telephone Encounter (Signed)
 Patient called stating that the fluvoxaMINE  (LUVOX ) 50 MG tablet is not working and also mentioned that she is having some issues with refilling the hydrOXYzine  (ATARAX ) 10 MG tablet  please advise

## 2024-06-09 ENCOUNTER — Ambulatory Visit: Admitting: Psychiatry

## 2024-06-09 ENCOUNTER — Other Ambulatory Visit: Payer: Self-pay

## 2024-06-09 ENCOUNTER — Encounter: Payer: Self-pay | Admitting: Psychiatry

## 2024-06-09 VITALS — BP 131/71 | HR 71 | Temp 97.4°F | Ht 63.0 in | Wt 147.6 lb

## 2024-06-09 DIAGNOSIS — G2401 Drug induced subacute dyskinesia: Secondary | ICD-10-CM | POA: Diagnosis not present

## 2024-06-09 DIAGNOSIS — F422 Mixed obsessional thoughts and acts: Secondary | ICD-10-CM

## 2024-06-09 DIAGNOSIS — F4321 Adjustment disorder with depressed mood: Secondary | ICD-10-CM | POA: Diagnosis not present

## 2024-06-09 DIAGNOSIS — F411 Generalized anxiety disorder: Secondary | ICD-10-CM | POA: Diagnosis not present

## 2024-06-09 MED ORDER — AUSTEDO XR PATIENT TITRATION 6 & 12 & 24 MG PO TEPK: 1.0000 | EXTENDED_RELEASE_TABLET | Freq: Every day | ORAL | Status: DC

## 2024-06-09 MED ORDER — FLUVOXAMINE MALEATE 100 MG PO TABS
100.0000 mg | ORAL_TABLET | Freq: Every day | ORAL | 0 refills | Status: DC
Start: 2024-06-09 — End: 2024-06-23

## 2024-06-09 MED ORDER — QUETIAPINE FUMARATE 200 MG PO TABS: 200.0000 mg | ORAL_TABLET | Freq: Every day | ORAL | 2 refills | Status: DC

## 2024-06-09 MED ORDER — GABAPENTIN 400 MG PO CAPS
800.0000 mg | ORAL_CAPSULE | Freq: Three times a day (TID) | ORAL | 2 refills | Status: DC
Start: 2024-06-09 — End: 2024-07-14

## 2024-06-09 NOTE — Progress Notes (Signed)
 BH MD/PA/NP OP Progress Note  06/09/2024 4:20 PM Melanie Ray  MRN:  [id number]  Chief Complaint:  Chief Complaint  Patient presents with   Follow-up   HPI: 73 year old female presenting for follow-up.  Patient reports that she is not feeling well on the medication and states that her OCD is getting worse.  Patient reports that she is not happy about the medications provided by the other provider and states she would like to stop the medication.  Patient has been educated that she cannot see multiple providers and has been asked which provide she would like to stick with.  Patient has been advised that when she goes to see Dr. Eluterio to please focus on neurocognitive medications and not psychotropic medications and when she verbalized agreement.  Patient states that due to sedation of Depakote she is stopping the medication and would like to not to continue.  Patient also verbalized feeling better on gabapentin  at 800 mg 3 times a day in which the total dose would be 3200mg  and which it is acceptable as patient is feeling better on this dosage.  This dosage will be reviewed with my supervising Dr. Arfeen.  Patient also reports that she has just been feeling overwhelming depression and anxiety with suicidal thoughts without a plan.  Patient reports that the source of all this is due to isolation and loneliness at home.  Patient does report that the source of all of her OCD and behaviors is related to the fact that she is just stuck at home with no other resources.  Patient reports that the suicidal thoughts is only being prevented as she has a little dog that she is taking care of.  Patient has been asked if she would like to consider a social work consult in which she stated agreement with.  Patient has also been shown to have concerns over the TD in which patient was recommended to start on [place] titration back in which she will be taking this for the next month.  Patient has been educated the  initial sedation and fatigue with the medication and has been advised to take at night.  Patient has been educated to call the provider if she has any other side effects.  Based on this assessment interview is recommended for the patient to continue medications as prescribed with prescription refills that she is to stop Depakote and to increase gabapentin  to 3200 mg a day total a day taking 800mg  3 times a day.  Patient also to start on Austedo  titration pack due to TD symptoms.  Patient to continue Seroquel  200 mg once a day at night.  Patient will also increase fluvoxamine  200 mg once daily to try to treat OCD symptoms.  Patient currently continues therapy with Augustina Bibb at [facility].  Patient currently endorses SI but has no plan.  Denies HI and AVH.  Patient with no other questions or concerns at this time.  Patient will follow up in 2 weeks.  I personally spent a total of 40 minutes in the care of the patient today including preparing to see the patient, getting/reviewing separately obtained history, performing a medically appropriate exam/evaluation, counseling and educating, placing orders, referring and communicating with other health care professionals, documenting clinical information in the EHR, and coordinating care.   Visit Diagnosis:    ICD-10-CM   1. Generalized anxiety disorder  F41.1 gabapentin  (NEURONTIN ) 400 MG capsule    2. Mixed obsessional thoughts and acts  F42.2 fluvoxaMINE  (  LUVOX ) 100 MG tablet    3. Grieving  F43.21 QUEtiapine  (SEROQUEL ) 200 MG tablet    AMB Referral [facility] Care Management    4. Tardive dyskinesia  G24.01 Deutetrabenazine  (AUSTEDO  XR PATIENT TITRATION) 6 & 12 & 24 MG TEPK      Past Psychiatric History:  Previous Psych Hospitalizations: Denies   Outpatient treatment: Currently seeing neurology for concerns of Alzheimer's disease.   Medications Current: - Aricept 10 mg once daily from neurology - Gabapentin  800 mg 3 times a day for  anxiety, patient self increase the medication from 400 mg 3 times a day due to anxiety and reports no side effects and would prefer to continue - Fluvox 100mg  once daily. -Seroquel  200mg  once daily at bedtime - Austedo  Titration pack for TD   Medication Trials: -Fluoxetine , poor response   Suicide & Violence: -Denies SI, HI, AVH    Psychotherapy: -Participating in monthly therapy with [facility]. Provided Therapy referral list and placed on wait list for Almarie at [facility]   Legal:  -No legal issues Past Medical History:  Past Medical History:  Diagnosis Date   ABLA (acute blood loss anemia) 10/24/2022   AICD (automatic cardioverter/defibrillator) present    Allergic rhinitis 09/25/2022   Back pain    Benign neoplasm of colon 04/27/2008   CAD (coronary artery disease) 04/11/2016   S/p ant STEMI 5/17: LHC >> LAD proximal 80%, mid 80%, distal 50%, ostial D1 60%; LCx with LPDA lesion 30%; RCA Mild calcification with no significant stenosis in a medium caliber, nondominant RCA; LVEF is estimated at 45% with inferoapical and lateral wall akinesis >> PCI: PCI: 3.5 x 24 mm Promus DES to prox LAD, 2.5 x 12 mm Promus DES to mid LAD.   Cerebellar stroke 03/30/2020   2021 MRI - Small, old left cerebellar infarct    Chest pain    Chronic HFrEF (heart failure with reduced ejection fraction) 10/25/2022   Chronic systolic CHF (congestive heart failure) 03/21/2016   Echo 01/30/17: Diff HK, mild focal basal septal hypertrophy, EF 30-35, mild AI, MAC, mild MR // Echo 06/08/16: Mild focal basal septal hypertrophy, EF 25-30%, diff HK, ant-septal AK, Gr 1 DD, mild AI, MAC, mild MR, PASP 37 mmHg // Echo 03/18/16: EF 30-35%, ant-septal AK, Gr 1 DD, mild MR, severe LAE.     Constipation    Diarrhea 01/02/2023   Dizziness    Dyspnea    Endotracheal tube present 10/24/2022   Essential hypertension 04/27/2008   Well-controlled today.  She will continue valsartan  80 mg twice a day, Imdur  30 mg daily,  Lasix  20 mg every other day, [name]  10 mg daily, and carvedilol  12.5 mg twice daily.   Finger fracture 08/15/2021   She reports this is well-healing.  She will continue to see orthopedics.   Generalized anxiety disorder 04/27/2008   Glaucoma    Gout    History of acute anterior wall MI 03/17/2016   History of colonoscopy 04/27/2008   History of COVID-19 06/27/2022   HLD (hyperlipidemia) 04/27/2008   Check lipid panel.  She will continue Lipitor .   Hot flash, menopausal 02/07/2018   Will check with our clinical pharmacist regarding the black cohosh and primrose in this patient.   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   Iron deficiency anemia, unspecified 04/27/2008   Ischemic cardiomyopathy 10/02/2016   Joint pain  Left bundle branch block 12/03/2018   Chronic.  She will follow with cardiology.   Major depressive disorder 07/28/2015   Chronic ongoing issue.  Has worsened somewhat recently.  Notes passive SI.  Advised if she develops intent or plan to harm herself she needs to go to the emergency room.  I advised her to contact her psychiatrist to arrange for follow-up and she was willing to do so.  She does have protective factors in place including her husband, dog, and nephew.   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  She will follow with cardiology.  We will send her EKG to her psychiatrist and have CMA contact the psychiatry office to inform them that the EKG was faxed and of the results.   Rash 02/07/2018   Possibly related to folliculitis or some other undetermined cause of her rash.  She  will trial over-the-counter antibiotic ointment and if not beneficial she will let us  know and we can refer her to dermatology.   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 Wonda and Dr. 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  She will continue [name]  10 mg daily.   Uterine leiomyoma 04/27/2008   Vitamin D  deficiency 09/15/2010    Past Surgical History:  Procedure Laterality Date   APPENDECTOMY     BIV ICD INSERTION CRT-D N/A 01/02/2022   Procedure: BIV ICD INSERTION CRT-D;  Surgeon: Cindie Ole DASEN, MD;  Location: [facility] INVASIVE CV LAB;  Service: Cardiovascular;  Laterality: N/A;   CARDIAC CATHETERIZATION N/A 03/17/2016   Procedure: Left Heart Cath and Coronary Angiography;  Surgeon: Ozell Wonda, MD;  Location: [facility] INVASIVE CV LAB;  Service: Cardiovascular;  Laterality: N/A;   CARDIAC CATHETERIZATION N/A 03/17/2016   Procedure: Coronary Stent Intervention;  Surgeon: Ozell Wonda, MD;  Location: [facility] INVASIVE CV LAB;  Service: Cardiovascular;  Laterality: N/A;   COLONOSCOPY WITH PROPOFOL  N/A 08/13/2017   Procedure: COLONOSCOPY WITH PROPOFOL ;  Surgeon: Therisa Bi, MD;  Location: [facility] ENDOSCOPY;  Service: Gastroenterology;  Laterality: N/A;   COLONOSCOPY WITH PROPOFOL  N/A 03/29/2020   Procedure: COLONOSCOPY WITH PROPOFOL ;  Surgeon: Jinny Carmine, MD;  Location: [facility] ENDOSCOPY;  Service: Endoscopy;  Laterality: N/A;   EXPLORATION POST OPERATIVE OPEN HEART N/A 10/24/2022   Procedure: EXPLORATION POST OPERATIVE OPEN HEART;  Surgeon: Maryjane Mt, MD;  Location: [facility] OR;  Service: Open Heart Surgery;  Laterality: N/A;   MITRAL VALVE REPLACEMENT N/A 10/24/2022   Procedure: MITRAL VALVE (MV) REPLACEMENT USING A 29  MM MEDTRONIC MOSAIC 310;  Surgeon: Maryjane Mt, MD;  Location: [facility] OR;  Service: Open Heart Surgery;  Laterality: N/A;   RIGHT HEART CATH N/A 10/15/2022   Procedure: RIGHT HEART CATH;  Surgeon: Wonda Ozell, MD;  Location: [facility] INVASIVE CV LAB;  Service: Cardiovascular;  Laterality: N/A;   RIGHT/LEFT HEART CATH AND CORONARY ANGIOGRAPHY N/A 08/02/2022   Procedure: RIGHT/LEFT HEART CATH AND CORONARY ANGIOGRAPHY;  Surgeon: Cherrie Toribio SAUNDERS, MD;  Location: [facility] INVASIVE CV LAB;  Service:  Cardiovascular;  Laterality: N/A;   SMALL BOWEL REPAIR     TEE WITHOUT CARDIOVERSION N/A 08/02/2022   Procedure: TRANSESOPHAGEAL ECHOCARDIOGRAM (TEE);  Surgeon: Cherrie Toribio SAUNDERS, MD;  Location: [facility] ENDOSCOPY;  Service: Cardiovascular;  Laterality: N/A;   TEE WITHOUT CARDIOVERSION N/A 10/04/2022   Procedure: TRANSESOPHAGEAL ECHOCARDIOGRAM (TEE);  Surgeon: Wonda Sharper, MD;  Location: [facility] INVASIVE CV LAB;  Service: Cardiovascular;  Laterality: N/A;   TEE WITHOUT CARDIOVERSION N/A 10/24/2022   Procedure: TRANSESOPHAGEAL ECHOCARDIOGRAM (TEE);  Surgeon: Maryjane Mt, MD;  Location: [facility] OR;  Service: Open Heart Surgery;  Laterality: N/A;   TONSILLECTOMY     TRANSCATHETER MITRAL EDGE TO EDGE REPAIR N/A 10/04/2022   Procedure: MITRAL VALVE REPAIR;  Surgeon: Wonda Sharper, MD;  Location: [facility] INVASIVE CV LAB;  Service: Cardiovascular;  Laterality: N/A;   UTERINE FIBROID SURGERY      Family Psychiatric History: No additional  Family History:  Family History  Problem Relation Age of Onset   Anxiety disorder Mother    Paranoid behavior Mother    Hypertension Mother    Dementia Mother    High Cholesterol Mother    Diabetes Mother    Hyperlipidemia Mother    Depression Mother    Hypertension Father    High Cholesterol Father    Mood Disorder Sister    Stroke Sister    Anxiety disorder Maternal Aunt    Tuberculosis Paternal Grandfather    Drug abuse Cousin     Social History:  Social History    Socioeconomic History   Marital status: Widowed    Spouse name: Ling Flesch   Number of children: 0   Years of education: 16   Highest education level: Bachelor's degree (e.g., BA, AB, BS)  Occupational History   Occupation: Retired    Comment: Runner, broadcasting/film/video  Tobacco Use   Smoking status: Former    Current packs/day: 0.00    Average packs/day: 0.5 packs/day for 25.0 years (12.5 ttl pk-yrs)    Types: Cigarettes    Start date: 04/13/1972    Quit date: 04/13/1997    Years since quitting: 27.1   Smokeless tobacco: Never  Vaping Use   Vaping status: Never Used  Substance and Sexual Activity   Alcohol use: Not Currently   Drug use: No   Sexual activity: Not Currently  Other Topics Concern   Not on file  Social History Narrative   Lives in [place] with spouse.  No children.   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   Left-handed   Lives in a two story home       Social Drivers of Health   Financial Resource Strain: Low Risk  (10/18/2023)   Received from [facility]   Overall Financial Resource Strain (CARDIA)    Difficulty of Paying Living Expenses: Not hard at all  Recent Concern: Financial Resource Strain - Medium Risk (08/08/2023)   Overall Financial Resource Strain (CARDIA)    Difficulty of Paying Living Expenses: Somewhat hard  Food Insecurity: No Food Insecurity (10/18/2023)   Received from [facility]   Hunger Vital Sign    Within the past 12 months, you worried that your food would run out before you got the money to buy more.: Never true    Within the past 12 months, the food you bought just didn't last and you didn't have money to get more.: Never true  Transportation Needs:  No Transportation Needs (10/18/2023)   Received from [facility] - Transportation    In the past 12 months, has lack of transportation kept you from  medical appointments or from getting medications?: No    Lack of Transportation (Non-Medical): No  Physical Activity: Inactive (08/08/2023)   Exercise Vital Sign    Days of Exercise per Week: 0 days    Minutes of Exercise per Session: 0 min  Stress: Stress Concern Present (08/08/2023)   [employer] of Occupational Health - Occupational Stress Questionnaire    Feeling of Stress : Very much  Social Connections: Moderately Integrated (08/08/2023)   Social Connection and Isolation Panel    Frequency of Communication with Friends and Family: More than three times a week    Frequency of Social Gatherings with Friends and Family: Never    Attends Religious Services: More than 4 times per year    Active Member of [employer] or Organizations: No    Attends Banker Meetings: Never    Marital Status: Married    Allergies:  Allergies  Allergen Reactions   Tetracycline Swelling   Meperidine Nausea And Vomiting    (Demerol)Nausea   Entresto  [Sacubitril -Valsartan ] Other (See Comments)    Shortness of Breath    Metabolic Disorder Labs: Lab Results  Component Value Date   HGBA1C 5.9 04/23/2024   MPG 111.15 05/07/2022   MPG 139.85 03/31/2020   No results found for: PROLACTIN Lab Results  Component Value Date   CHOL 140 04/23/2024   TRIG 55.0 04/23/2024   HDL 45.90 04/23/2024   CHOLHDL 3 04/23/2024   VLDL 11.0 04/23/2024   LDLCALC 83 04/23/2024   LDLCALC 75 03/20/2023   Lab Results  Component Value Date   TSH 0.79 12/28/2022   TSH 0.64 09/05/2021    Therapeutic Level Labs: No results found for: LITHIUM No results found for: VALPROATE No results found for: CBMZ  Current Medications: Current Outpatient Medications  Medication Sig Dispense Refill   aspirin  EC 81 MG tablet Take 1 tablet (81 mg total) by mouth daily. Swallow whole. 30 tablet 12   atorvastatin  (LIPITOR ) 80 MG tablet Take 1 tablet (80 mg total) by mouth daily. 90 tablet 3   carvedilol  (COREG )  12.5 MG tablet Take 1 tablet (12.5 mg total) by mouth 2 (two) times daily with a meal. 180 tablet 2   donepezil (ARICEPT) 10 MG tablet Take 10 mg by mouth at bedtime.     dorzolamide -timolol  (COSOPT ) 22.3-6.8 MG/ML ophthalmic solution Place 1 drop into both eyes 2 (two) times daily.      fluticasone  (FLONASE ) 50 MCG/ACT nasal spray Place 2 sprays into both nostrils daily. 16 g 6   fluvoxaMINE  (LUVOX ) 50 MG tablet Take 1 tablet (50 mg total) by mouth at bedtime. 30 tablet 2   furosemide  (LASIX ) 40 MG tablet Take 40 mg by mouth as needed.     gabapentin  (NEURONTIN ) 400 MG capsule Take 2 capsules (800 mg total) by mouth 3 (three) times daily. 60 capsule 3   hydrOXYzine  (ATARAX ) 10 MG tablet Take 1 tablet (10 mg total) by mouth 3 (three) times daily as needed. 90 tablet 0   isosorbide  mononitrate (IMDUR ) 30 MG 24 hr tablet Take 1 tablet by mouth once daily 90 tablet 3   QUEtiapine  (SEROQUEL ) 200 MG tablet Take 200 mg by mouth at bedtime.     valsartan  (DIOVAN ) 80 MG tablet Take 1 tablet by mouth twice daily (Patient taking differently: Take  80 mg by mouth daily.) 180 tablet 0   No current facility-administered medications for this visit.     Musculoskeletal: Strength & Muscle Tone: within normal limits Gait & Station: normal Patient leans: N/A  Psychiatric Specialty Exam: Review of Systems  Constitutional: Negative.   HENT: Negative.    Eyes: Negative.   Respiratory: Negative.    Cardiovascular: Negative.   Gastrointestinal: Negative.   Endocrine: Negative.   Genitourinary: Negative.   Musculoskeletal: Negative.   Skin: Negative.   Allergic/Immunologic: Negative.   Neurological:  Positive for dizziness and weakness.  Hematological: Negative.   Psychiatric/Behavioral:  Positive for behavioral problems and dysphoric mood. The patient is nervous/anxious.     Blood pressure 131/71, pulse 71, temperature (!) 97.4 F (36.3 C), temperature source Temporal, height 5' 3 (1.6 m), weight 147  lb 9.6 oz (67 kg).Body mass index is 26.15 kg/m.  General Appearance: Well Groomed  Eye Contact:  Good  Speech:  Clear and Coherent  Volume:  Normal  Mood:  Depressed and Dysphoric  Affect:  Depressed  Thought Process:  Coherent  Orientation:  Full (Time, Place, and Person)  Thought Content: Logical   Suicidal Thoughts:  Yes.  without intent/plan  Homicidal Thoughts:  No  Memory:  Immediate;   Good Recent;   Good Remote;   Good  Judgement:  Good  Insight:  Good  Psychomotor Activity:  Normal  Concentration:  Concentration: Good and Attention Span: Good  Recall:  Good  Fund of Knowledge: Good  Language: Good  Akathisia:  No  Handed:  Right  AIMS (if indicated):   Assets:  Desire for Improvement Financial Resources/Insurance Housing  ADL's:  Intact  Cognition: WNL  Sleep:  Good   Screenings: GAD-7    Flowsheet Row Office Visit from 05/13/2024 in [facility] Office Visit from 04/23/2024 in [facility] [employer] Visit from 04/15/2024 in [facility] Visit from 04/01/2024 in [facility] Office Visit from 11/13/2023 in [facility] [facility]  Total GAD-7 Score 21 18 17 19 18    [employer]    Flowsheet Row Office Visit from 05/13/2024 in [facility] Office Visit from 04/23/2024 in [facility] [employer] Visit from 04/15/2024 in [facility] Office Visit from 04/01/2024 in [facility] Office Visit from 03/19/2024 in [facility]  PHQ-2 Total Score 6 6 6 6 6   PHQ-9 Total Score 23 17 16 17 17    Flowsheet Row Office Visit from 05/13/2024 in [facility] Office Visit from  04/15/2024 in [facility] Office Visit from 04/01/2024 in [facility]  C-SSRS RISK CATEGORY Low Risk Moderate Risk Moderate Risk     Assessment and Plan:  Assessment - Diagnosis: Mixed obsessional thoughts and acts [F42.2]  2. Grieving [F43.21]  3. Generalized anxiety disorder [F41.1]   - Progress: Patient reports that she had a good trip to [facility] over the [facility] Day holiday and noticed that while she was on the trip she did not have any OCD symptoms.  Patient believes that she has OCD symptoms whenever she is by herself and isolated in her home now that she is without her husband. - Risk Factors: Worsening symptoms, suicide risk  Plan - Medications:  Continue to  take Gabapentin  at 800mg  TID for anxiety. Pt reports she increased the dose on her own, and is stating no complications of sedation. Increase 100mg  Fluvox, once a day at bedtime for OCD.  Continue Quetiapine  200mg   once a day at night, prescribed by another provider patient was reminded of sedation nature of the medication to take her time and waking up as well as monitoring sedation in the mornings and to notify the clinician should it become an issue. Stop Depakote.  Start Austedo  Titration (12mg , 18mg , 24mg , 30mg ) pack once daily for TD. Pt educated of sedation and fatigue and educated to take at night.   - Psychotherapy: Pt with [facility] for therapy.  Patient is also being recommended to be placed on wait list for [facility] who specializes with OCD here at the [facility] office. - Education: Patient has been educated on the evidence-based recommendations for therapy and pharmacotherapy with OCD symptoms.  As patient is identifying severe symptoms of rituals with OCD patient is recommended to start on medications while waiting for a therapy referral.  Patient has been educated on medications with medication purpose, side effects and adverse  reactions.  Patient has also been educated on the need for community support and community involvement during isolation as the patient has a recent widow of 2 months ago.  Patient has been encouraged to seek social groups within her faith-based or community base groups.  Patient has also been educated on importance of following up weekly due to severe symptoms to monitor medication effectiveness as well as possible adjustments. - Follow-Up: Patient to follow-up once Neurology is done with med management.  - Referrals: Patient has been referred to outside therapy as well as being placed on the wait list for Almarie here at [facility] office. - Safety Planning: The patient has been educated, if they should have suicidal thoughts with or without a plan to call 911, or go to the closest emergency department.  Pt verbalized understanding.  Pt denies firearms within the home.  Pt also agrees to call the clinic should they have worsening symptoms before the next appointment. Patient/Guardian was advised Release of Information must be obtained prior to any record release in order to collaborate their care with an outside provider. Patient/Guardian was advised if they have not already done so to contact the registration department to sign all necessary forms in order for us  to release information regarding their care.   Patient/Guardian was advised Release of Information must be obtained prior to any record release in order to collaborate their care with an outside provider. Patient/Guardian was advised if they have not already done so to contact the registration department to sign all necessary forms in order for us  to release information regarding their care.   Consent: Patient/Guardian gives verbal consent for treatment and assignment of benefits for services provided during this visit. Patient/Guardian expressed understanding and agreed to proceed.    Dorn Jama Der, NP 06/09/2024, 4:20 PM

## 2024-06-10 ENCOUNTER — Other Ambulatory Visit: Payer: Self-pay | Admitting: Psychiatry

## 2024-06-10 ENCOUNTER — Ambulatory Visit: Payer: Self-pay

## 2024-06-10 NOTE — Telephone Encounter (Signed)
 FYI Only or Action Required?: Action required by provider: request for appointment.  Patient was last seen in primary care on 04/23/2024 by Abbey Bruckner, MD.  Called Nurse Triage reporting Fatigue.  Symptoms began about a month ago.  Interventions attempted: Nothing.  Symptoms are: gradually worsening. Reports she has had fatigue for awhile but it's getting worse.  Triage Disposition: See Physician Within 24 Hours  Patient/caregiver understands and will follow disposition?: Yes   Copied from CRM [id number]. Topic: Clinical - Red Word Triage >> Jun 10, 2024  1:56 PM Berneda FALCON wrote: Red Word that prompted transfer to Nurse Triage: Patient states she has had extreme fatigue for quite some time did not have specified amount of time. Reason for Disposition  [1] MODERATE weakness (e.g., interferes with work, school, normal activities) AND [2] persists > 3 days  Answer Assessment - Initial Assessment Questions 1. DESCRIPTION: Describe how you are feeling.     Fatigue 2. SEVERITY: How bad is it?  Can you stand and walk?     weakness 3. ONSET: When did these symptoms begin? (e.g., hours, days, weeks, months)     Getting worse 4. CAUSE: What do you think is causing the weakness or fatigue? (e.g., not drinking enough fluids, medical problem, trouble sleeping)     unsure 5. NEW MEDICINES:  Have you started on any new medicines recently? (e.g., opioid pain medicines, benzodiazepines, muscle relaxants, antidepressants, antihistamines, neuroleptics, beta blockers)     Yes - fluvoxamine  6. OTHER SYMPTOMS: Do you have any other symptoms? (e.g., chest pain, fever, cough, SOB, vomiting, diarrhea, bleeding, other areas of pain)     no 7. PREGNANCY: Is there any chance you are pregnant? When was your last menstrual period?     no  Protocols used: Weakness (Generalized) and Fatigue-A-AH

## 2024-06-12 ENCOUNTER — Ambulatory Visit (INDEPENDENT_AMBULATORY_CARE_PROVIDER_SITE_OTHER): Admitting: Nurse Practitioner

## 2024-06-12 ENCOUNTER — Telehealth: Payer: Self-pay

## 2024-06-12 ENCOUNTER — Encounter: Payer: Self-pay | Admitting: Nurse Practitioner

## 2024-06-12 VITALS — Temp 97.9°F | Ht 63.0 in | Wt 146.4 lb

## 2024-06-12 DIAGNOSIS — R42 Dizziness and giddiness: Secondary | ICD-10-CM | POA: Diagnosis not present

## 2024-06-12 NOTE — Progress Notes (Signed)
 Complex Care Management Note  Care Guide Note 06/12/2024 Name: Janeshia Ciliberto MRN: [id number] DOB: July 02, 1951  Henlee Donovan is a 73 y.o. year old female who sees Bair, Kalpana, MD for primary care. I reached out to Meade Jenkins Marc by phone today to offer complex care management services.  Ms. Socarras was given information about Complex Care Management services today including:   The Complex Care Management services include support from the care team which includes your Nurse Care Manager, Clinical Social Worker, or Pharmacist.  The Complex Care Management team is here to help remove barriers to the health concerns and goals most important to you. Complex Care Management services are voluntary, and the patient may decline or stop services at any time by request to their care team member.   Complex Care Management Consent Status: Patient agreed to services and verbal consent obtained.   Follow up plan:  Telephone appointment with complex care management team member scheduled for:  06/25/24 at 3:00 p.m.   Encounter Outcome:  Patient Scheduled  Dreama [facility]  [facility], [facility] Care Management Assistant Direct Dial: [phone]  Fax: [phone]

## 2024-06-12 NOTE — Progress Notes (Signed)
 Established Patient Office Visit  Subjective:  Patient ID: Melanie Ray, female    DOB: 01/25/1951  Age: 73 y.o. MRN: [id number]  CC:  Chief Complaint  Patient presents with   Acute Visit    Fatigue is getting worse, dizzy, hot flashes and trouble remembering things   Discussed the use of a AI scribe software for clinical note transcription with the patient, who gave verbal consent to proceed.  HPI Melanie Ray is a 73 year old female with a history of stroke who presents with fatigue and dizziness.  She experiences persistent fatigue, making it difficult to move between rooms. Dizziness, described as imbalance rather than vertigo, worsens over time and occurs primarily when standing or walking, leading to unsteadiness and near falls. She has difficulty with speech, concentration, and memory, often forgetting tasks and struggling to focus. Her mouth occasionally twists, and she experiences intermittent difficulty speaking. She experiences frequent hot flashes throughout the day and night. Her medical history includes obsessive-compulsive disorder, with a recent increase in medication dosage.   HPI   Past Medical History:  Diagnosis Date   ABLA (acute blood loss anemia) 10/24/2022   AICD (automatic cardioverter/defibrillator) present    Allergic rhinitis 09/25/2022   Back pain    Benign neoplasm of colon 04/27/2008   CAD (coronary artery disease) 04/11/2016   S/p ant STEMI 5/17: LHC >> LAD proximal 80%, mid 80%, distal 50%, ostial D1 60%; LCx with LPDA lesion 30%; RCA Mild calcification with no significant stenosis in a medium caliber, nondominant RCA; LVEF is estimated at 45% with inferoapical and lateral wall akinesis >> PCI: PCI: 3.5 x 24 mm Promus DES to prox LAD, 2.5 x 12 mm Promus DES to mid LAD.   Cerebellar stroke 03/30/2020   2021 MRI - Small, old left cerebellar infarct    Chest pain    Chronic HFrEF (heart failure with reduced ejection fraction)  10/25/2022   Chronic systolic CHF (congestive heart failure) 03/21/2016   Echo 01/30/17: Diff HK, mild focal basal septal hypertrophy, EF 30-35, mild AI, MAC, mild MR // Echo 06/08/16: Mild focal basal septal hypertrophy, EF 25-30%, diff HK, ant-septal AK, Gr 1 DD, mild AI, MAC, mild MR, PASP 37 mmHg // Echo 03/18/16: EF 30-35%, ant-septal AK, Gr 1 DD, mild MR, severe LAE.     Constipation    Diarrhea 01/02/2023   Dizziness    Dyspnea    Endotracheal tube present 10/24/2022   Essential hypertension 04/27/2008   Well-controlled today.  She will continue valsartan  80 mg twice a day, Imdur  30 mg daily, Lasix  20 mg every other day, [name]  10 mg daily, and carvedilol  12.5 mg twice daily.   Finger fracture 08/15/2021   She reports this is well-healing.  She will continue to see orthopedics.   Generalized anxiety disorder 04/27/2008   Glaucoma    Gout    History of acute anterior wall MI 03/17/2016   History of colonoscopy 04/27/2008   History of COVID-19 06/27/2022   HLD (hyperlipidemia) 04/27/2008   Check lipid panel.  She will continue Lipitor .   Hot flash, menopausal 02/07/2018   Will check with our clinical pharmacist regarding the black cohosh and primrose in this patient.   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   Iron deficiency anemia, unspecified 04/27/2008   Ischemic cardiomyopathy 10/02/2016   Joint pain  Left bundle branch block 12/03/2018   Chronic.  She will follow with cardiology.   Major depressive disorder 07/28/2015   Chronic ongoing issue.  Has worsened somewhat recently.  Notes passive SI.  Advised if she develops intent or plan to harm herself she needs to go to the emergency room.  I advised her to contact her psychiatrist to arrange for follow-up and she was willing to do so.  She does have protective factors in  place including her husband, dog, and nephew.   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  She will follow with cardiology.  We will send her EKG to her psychiatrist and have CMA contact the psychiatry office to inform them that the EKG was faxed and of the results.   Rash 02/07/2018   Possibly related to folliculitis or some other undetermined cause of her rash.  She will trial over-the-counter antibiotic ointment and if not beneficial she will let us  know and we can refer her to dermatology.   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 Wonda and Dr. 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  She will continue [name]  10 mg daily.   Uterine leiomyoma 04/27/2008   Vitamin D  deficiency 09/15/2010    Past Surgical History:  Procedure Laterality Date   APPENDECTOMY     BIV ICD INSERTION CRT-D N/A 01/02/2022   Procedure: BIV ICD INSERTION CRT-D;  Surgeon: Cindie Ole DASEN, MD;  Location[facility] INVASIVE CV LAB;  Service: Cardiovascular;  Laterality: N/A;   CARDIAC CATHETERIZATION N/A 03/17/2016   Procedure: Left Heart Cath and Coronary Angiography;  Surgeon: Ozell Wonda, MD;  Location: [facility] INVASIVE CV LAB;  Service: Cardiovascular;  Laterality: N/A;   CARDIAC CATHETERIZATION N/A 03/17/2016   Procedure: Coronary Stent Intervention;  Surgeon: Ozell Wonda, MD;  Location: [facility] INVASIVE CV LAB;  Service: Cardiovascular;  Laterality: N/A;   COLONOSCOPY WITH PROPOFOL  N/A 08/13/2017    Procedure: COLONOSCOPY WITH PROPOFOL ;  Surgeon: Therisa Bi, MD;  Location: [facility] ENDOSCOPY;  Service: Gastroenterology;  Laterality: N/A;   COLONOSCOPY WITH PROPOFOL  N/A 03/29/2020   Procedure: COLONOSCOPY WITH PROPOFOL ;  Surgeon: Jinny Carmine, MD;  Location: [facility] ENDOSCOPY;  Service: Endoscopy;  Laterality: N/A;   EXPLORATION POST OPERATIVE OPEN HEART N/A 10/24/2022   Procedure: EXPLORATION POST OPERATIVE OPEN HEART;  Surgeon: Maryjane Mt, MD;  Location: [facility] OR;  Service: Open Heart Surgery;  Laterality: N/A;   MITRAL VALVE REPLACEMENT N/A 10/24/2022   Procedure: MITRAL VALVE (MV) REPLACEMENT USING A 29 MM MEDTRONIC MOSAIC 310;  Surgeon: Maryjane Mt, MD;  Location: [facility] OR;  Service: Open Heart Surgery;  Laterality: N/A;   RIGHT HEART CATH N/A 10/15/2022   Procedure: RIGHT HEART CATH;  Surgeon: Wonda Ozell, MD;  Location: [facility] INVASIVE CV LAB;  Service: Cardiovascular;  Laterality: N/A;   RIGHT/LEFT HEART CATH AND CORONARY ANGIOGRAPHY N/A 08/02/2022   Procedure: RIGHT/LEFT HEART CATH AND CORONARY ANGIOGRAPHY;  Surgeon: Cherrie Toribio SAUNDERS, MD;  Location: [facility] INVASIVE CV LAB;  Service:  Cardiovascular;  Laterality: N/A;   SMALL BOWEL REPAIR     TEE WITHOUT CARDIOVERSION N/A 08/02/2022   Procedure: TRANSESOPHAGEAL ECHOCARDIOGRAM (TEE);  Surgeon: Cherrie Toribio SAUNDERS, MD;  Location: [facility] ENDOSCOPY;  Service: Cardiovascular;  Laterality: N/A;   TEE WITHOUT CARDIOVERSION N/A 10/04/2022   Procedure: TRANSESOPHAGEAL ECHOCARDIOGRAM (TEE);  Surgeon: Wonda Sharper, MD;  Location: [facility] INVASIVE CV LAB;  Service: Cardiovascular;  Laterality: N/A;   TEE WITHOUT CARDIOVERSION N/A 10/24/2022   Procedure: TRANSESOPHAGEAL ECHOCARDIOGRAM (TEE);  Surgeon: Maryjane Mt, MD;  Location: [facility] OR;  Service: Open Heart Surgery;  Laterality: N/A;   TONSILLECTOMY     TRANSCATHETER MITRAL EDGE TO EDGE REPAIR N/A 10/04/2022   Procedure: MITRAL VALVE REPAIR;  Surgeon: Wonda Sharper, MD;  Location: [facility] INVASIVE CV LAB;  Service:  Cardiovascular;  Laterality: N/A;   UTERINE FIBROID SURGERY      Family History  Problem Relation Age of Onset   Anxiety disorder Mother    Paranoid behavior Mother    Hypertension Mother    Dementia Mother    High Cholesterol Mother    Diabetes Mother    Hyperlipidemia Mother    Depression Mother    Hypertension Father    High Cholesterol Father    Mood Disorder Sister    Stroke Sister    Anxiety disorder Maternal Aunt    Tuberculosis Paternal Grandfather    Drug abuse Cousin     Social History   Socioeconomic History   Marital status: Widowed    Spouse name: Arabella Revelle   Number of children: 0   Years of education: 16   Highest education level: Bachelor's degree (e.g., BA, AB, BS)  Occupational History   Occupation: Retired    Comment: Runner, broadcasting/film/video  Tobacco Use   Smoking status: Former    Current packs/day: 0.00    Average packs/day: 0.5 packs/day for 25.0 years (12.5 ttl pk-yrs)    Types: Cigarettes    Start date: 04/13/1972    Quit date: 04/13/1997    Years since quitting: 27.2   Smokeless tobacco: Never  Vaping Use   Vaping status: Never Used  Substance and Sexual Activity   Alcohol use: Not Currently    Comment: Occasional use   Drug use: No   Sexual activity: Not Currently  Other Topics Concern   Not on file  Social History Narrative   Lives in [place] with spouse.  No children.   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   Left-handed   Lives in a two story home       Social Drivers of Health   Financial Resource Strain: Low Risk  (10/18/2023)   Received from [facility]   Overall Financial Resource Strain (CARDIA)    Difficulty of Paying Living Expenses: Not hard at all  Recent Concern: Financial Resource Strain - Medium Risk (08/08/2023)   Overall Financial Resource Strain (CARDIA)    Difficulty of Paying Living Expenses: Somewhat hard  Food  Insecurity: No Food Insecurity (06/25/2024)   Hunger Vital Sign    Worried About Running Out of Food in the Last Year: Never true    Ran Out of Food in the Last Year: Never true  Transportation Needs: No Transportation Needs (06/25/2024)   PRAPARE - Administrator, Civil Service (Medical): No    Lack of Transportation (Non-Medical): No  Physical Activity: Inactive (08/08/2023)   Exercise Vital Sign  Days of Exercise per Week: 0 days    Minutes of Exercise per Session: 0 min  Stress: Stress Concern Present (08/08/2023)   [employer] of Occupational Health - Occupational Stress Questionnaire    Feeling of Stress : Very much  Social Connections: Moderately Integrated (08/08/2023)   Social Connection and Isolation Panel    Frequency of Communication with Friends and Family: More than three times a week    Frequency of Social Gatherings with Friends and Family: Never    Attends Religious Services: More than 4 times per year    Active Member of [employer] or Organizations: No    Attends Banker Meetings: Never    Marital Status: Married  Catering manager Violence: Not At Risk (06/25/2024)   Humiliation, Afraid, Rape, and Kick questionnaire    Fear of Current or Ex-Partner: No    Emotionally Abused: No    Physically Abused: No    Sexually Abused: No     Outpatient Medications Prior to Visit  Medication Sig Dispense Refill   aspirin  EC 81 MG tablet Take 1 tablet (81 mg total) by mouth daily. Swallow whole. 30 tablet 12   atorvastatin  (LIPITOR ) 80 MG tablet Take 1 tablet (80 mg total) by mouth daily. 90 tablet 3   Deutetrabenazine  (AUSTEDO  XR PATIENT TITRATION) 6 & 12 & 24 MG TEPK Take 1 tablet by mouth daily.     donepezil (ARICEPT) 10 MG tablet Take 10 mg by mouth at bedtime.     dorzolamide -timolol  (COSOPT ) 22.3-6.8 MG/ML ophthalmic solution Place 1 drop into both eyes 2 (two) times daily.      fluticasone  (FLONASE ) 50 MCG/ACT nasal spray Place 2 sprays into both  nostrils daily. 16 g 6   furosemide  (LASIX ) 40 MG tablet Take 40 mg by mouth as needed.     gabapentin  (NEURONTIN ) 400 MG capsule Take 2 capsules (800 mg total) by mouth 3 (three) times daily. 180 capsule 2   hydrOXYzine  (ATARAX ) 10 MG tablet Take 1 tablet (10 mg total) by mouth 3 (three) times daily as needed. 90 tablet 0   isosorbide  mononitrate (IMDUR ) 30 MG 24 hr tablet Take 1 tablet by mouth once daily 90 tablet 3   carvedilol  (COREG ) 12.5 MG tablet Take 1 tablet (12.5 mg total) by mouth 2 (two) times daily with a meal. 180 tablet 2   fluvoxaMINE  (LUVOX ) 100 MG tablet Take 1 tablet (100 mg total) by mouth at bedtime. 30 tablet 0   QUEtiapine  (SEROQUEL ) 200 MG tablet Take 1 tablet (200 mg total) by mouth at bedtime. 30 tablet 2   valsartan  (DIOVAN ) 80 MG tablet Take 1 tablet by mouth twice daily 180 tablet 0   No facility-administered medications prior to visit.    Allergies  Allergen Reactions   Tetracycline Swelling   Meperidine Nausea And Vomiting    (Demerol)Nausea   Entresto  [Sacubitril -Valsartan ] Other (See Comments)    Shortness of Breath    ROS Review of Systems Negative unless indicated in HPI.    Objective:    Physical Exam Constitutional:      Appearance: Normal appearance.  HENT:     Head: Normocephalic.  Cardiovascular:     Rate and Rhythm: Normal rate and regular rhythm.     Pulses: Normal pulses.     Heart sounds: Normal heart sounds.  Pulmonary:     Effort: Pulmonary effort is normal.     Breath sounds: Normal breath sounds. No wheezing.  Neurological:     General: No  focal deficit present.     Mental Status: She is alert and oriented to person, place, and time.     Coordination: Coordination abnormal.     Gait: Gait abnormal.  Psychiatric:        Mood and Affect: Mood normal.        Behavior: Behavior normal.     Temp 97.9 F (36.6 C)   Ht 5' 3 (1.6 m)   Wt 146 lb 6.4 oz (66.4 kg)   SpO2 97%   BMI 25.93 kg/m  Wt Readings from Last 3  Encounters:  06/12/24 146 lb 6.4 oz (66.4 kg)  04/23/24 143 lb (64.9 kg)  03/14/24 140 lb (63.5 kg)     Health Maintenance  Topic Date Due   Hepatitis C Screening  Never done   DTaP/Tdap/Td (1 - Tdap) Never done   Diabetic kidney evaluation - Urine ACR  06/03/2018   FOOT EXAM  03/01/2021   DEXA SCAN  08/25/2021   OPHTHALMOLOGY EXAM  10/06/2022   COVID-19 Vaccine (9 - Pfizer risk 2024-25 season) 03/09/2024   Medicare Annual Wellness (AWV)  08/07/2024   INFLUENZA VACCINE  02/02/2025 (Originally 06/05/2024)   HEMOGLOBIN A1C  10/23/2024   Colonoscopy  03/29/2025   Diabetic kidney evaluation - eGFR measurement  04/23/2025   HPV VACCINES  Aged Out   Meningococcal B Vaccine  Aged Out   Pneumococcal Vaccine: 50+ Years  Discontinued   MAMMOGRAM  Discontinued   Zoster Vaccines- Shingrix  Discontinued    There are no preventive care reminders to display for this patient.  Lab Results  Component Value Date   TSH 0.79 12/28/2022   Lab Results  Component Value Date   WBC 4.3 04/23/2024   HGB 11.6 (L) 04/23/2024   HCT 35.3 (L) 04/23/2024   MCV 81.7 04/23/2024   PLT 225.0 04/23/2024   Lab Results  Component Value Date   NA 139 04/23/2024   K 4.1 04/23/2024   CO2 24 04/23/2024   GLUCOSE 70 04/23/2024   BUN 21 04/23/2024   CREATININE 1.01 04/23/2024   BILITOT 0.4 04/23/2024   ALKPHOS 108 04/23/2024   AST 21 04/23/2024   ALT 15 04/23/2024   PROT 6.7 04/23/2024   ALBUMIN  4.1 04/23/2024   CALCIUM  9.7 04/23/2024   ANIONGAP 8 03/14/2024   EGFR 71 08/14/2023   GFR 55.04 (L) 04/23/2024   Lab Results  Component Value Date   CHOL 140 04/23/2024   Lab Results  Component Value Date   HDL 45.90 04/23/2024   Lab Results  Component Value Date   LDLCALC 83 04/23/2024   Lab Results  Component Value Date   TRIG 55.0 04/23/2024   Lab Results  Component Value Date   CHOLHDL 3 04/23/2024   Lab Results  Component Value Date   HGBA1C 5.9 04/23/2024      Assessment & Plan:   Dizziness Assessment & Plan: Pt experiencing fatigue, dizziness and unsteady gait for the past few weeks.  Patient reports that she has chronic  dizziness but has worsened recently. EKG shows nonspecific changes compared to the previous EKG. Discussed the case with Dr. Glendia and reviewed the EKG.  Dr. Glendia and I agreed for patient to be evaluated in the ED for further work up. Patient politely declined immediate evaluation, expressed reluctance to go to ED and refused EMS transport.   Dr. Glendia attempted to convince the patient however the patient declined, took her belongings and left the room.  Orders: -  EKG 12-Lead    Follow-up: No follow-ups on file.   Deloria Brassfield, NP

## 2024-06-13 ENCOUNTER — Other Ambulatory Visit: Payer: Self-pay | Admitting: Cardiology

## 2024-06-15 ENCOUNTER — Telehealth: Payer: Self-pay

## 2024-06-15 NOTE — Telephone Encounter (Signed)
 Left message for the Patient to call the office back so we can see how she is doing.

## 2024-06-15 NOTE — Telephone Encounter (Signed)
 Left another message for the Patient to call back so we can check on her from in office visit on 06/12/24.

## 2024-06-23 ENCOUNTER — Ambulatory Visit: Admitting: Psychiatry

## 2024-06-23 ENCOUNTER — Encounter: Payer: Self-pay | Admitting: Psychiatry

## 2024-06-23 DIAGNOSIS — F4321 Adjustment disorder with depressed mood: Secondary | ICD-10-CM

## 2024-06-23 DIAGNOSIS — F422 Mixed obsessional thoughts and acts: Secondary | ICD-10-CM

## 2024-06-23 MED ORDER — QUETIAPINE FUMARATE 200 MG PO TABS: 200.0000 mg | ORAL_TABLET | Freq: Every day | ORAL | 1 refills | Status: DC

## 2024-06-23 MED ORDER — FLUVOXAMINE MALEATE 100 MG PO TABS: 200.0000 mg | ORAL_TABLET | Freq: Every day | ORAL | 0 refills | Status: DC

## 2024-06-23 NOTE — Progress Notes (Signed)
 BH MD/PA/NP OP Progress Note  06/23/2024 1:35 PM Melanie Ray  MRN:  [id number]  Chief Complaint: Routine Follow-up HPI: Same for your female presenting ARPA for follow-up.  Patient reports that she feels that the medication still is not working and states that she would like to go up on medications.  When asked about what is going on patient reports that she has ecstatic that she is going be leaving town soon as she has multiple plans to keep her busy for the next month or so by visiting family in [place]  as well as going with family to [place] to see games.  Patient reports that whenever she goes to these visits she has no worries OCD or symptoms.  Patient has been asked if it is a isolation and loneliness that seems to be the contributor of the symptoms in which she is starting to recognize that yes it is she reports.  Patient has been asked what is a going to look like if she could live closer to family with cousins and not even immediate family in which she states that she feel her mental status will be at a much better place if she is close to family and knows she can share time with them.  Patient reports though that the medications are required and stated that the Austedo  has been doing amazing and this provider can tell that her involuntary movements had decreased greatly.  Patient verbalized satisfaction and states that is working appropriately and she is happy.  Patient stated concerned about the cause by the provider has been educating the patient that this cost will be taking care of if the patient qualifies for medication which she verbalized understanding and an gratitude.  Patient has been challenged to possibly consider relocating to [place]  and to put some thought on what it would look like for her to size down here in [place] and to relocate to [place]  to be closer with family with consideration for loneliness and isolation.  Patient verbalized understanding and states that she  agrees with the observation and states that she feels her mental status will be much better if she is close to family.  Based on this assessment interview is recommended for the patient to continue medication with the increase of Luvox  to 200 mg once daily.  Patient will manage all other medications at the same dosage.  See plan.  Patient denies SI, HI, AVH.  Patient with no other questions or concerns at this time.  Patient will follow up in 3 weeks. Visit Diagnosis:    ICD-10-CM   1. Mixed obsessional thoughts and acts  F42.2 fluvoxaMINE  (LUVOX ) 100 MG tablet    2. Grieving  F43.21 QUEtiapine  (SEROQUEL ) 200 MG tablet      Past Psychiatric History:  Previous Psych Hospitalizations: Denies   Outpatient treatment: Currently seeing neurology for concerns of Alzheimer's disease.   Medications Current: - Aricept 10 mg once daily from neurology - Gabapentin  800 mg 3 times a day for anxiety, patient self increase the medication from 400 mg 3 times a day due to anxiety and reports no side effects and would prefer to continue - Fluvox 200mg  once daily. -Seroquel  200mg  once daily at bedtime - Austedo  Titration pack for TD   Medication Trials: -Fluoxetine , poor response   Suicide & Violence: -Denies SI, HI, AVH    Psychotherapy: -Participating in monthly therapy with [facility]. Provided Therapy referral list and placed on wait list for [facility]  Legal:  -No legal issues  Past Medical History:  Past Medical History:  Diagnosis Date   ABLA (acute blood loss anemia) 10/24/2022   AICD (automatic cardioverter/defibrillator) present    Allergic rhinitis 09/25/2022   Back pain    Benign neoplasm of colon 04/27/2008   CAD (coronary artery disease) 04/11/2016   S/p ant STEMI 5/17: LHC >> LAD proximal 80%, mid 80%, distal 50%, ostial D1 60%; LCx with LPDA lesion 30%; RCA Mild calcification with no significant stenosis in a medium caliber, nondominant RCA; LVEF is estimated at  45% with inferoapical and lateral wall akinesis >> PCI: PCI: 3.5 x 24 mm Promus DES to prox LAD, 2.5 x 12 mm Promus DES to mid LAD.   Cerebellar stroke 03/30/2020   2021 MRI - Small, old left cerebellar infarct    Chest pain    Chronic HFrEF (heart failure with reduced ejection fraction) 10/25/2022   Chronic systolic CHF (congestive heart failure) 03/21/2016   Echo 01/30/17: Diff HK, mild focal basal septal hypertrophy, EF 30-35, mild AI, MAC, mild MR // Echo 06/08/16: Mild focal basal septal hypertrophy, EF 25-30%, diff HK, ant-septal AK, Gr 1 DD, mild AI, MAC, mild MR, PASP 37 mmHg // Echo 03/18/16: EF 30-35%, ant-septal AK, Gr 1 DD, mild MR, severe LAE.     Constipation    Diarrhea 01/02/2023   Dizziness    Dyspnea    Endotracheal tube present 10/24/2022   Essential hypertension 04/27/2008   Well-controlled today.  She will continue valsartan  80 mg twice a day, Imdur  30 mg daily, Lasix  20 mg every other day, [name]  10 mg daily, and carvedilol  12.5 mg twice daily.   Finger fracture 08/15/2021   She reports this is well-healing.  She will continue to see orthopedics.   Generalized anxiety disorder 04/27/2008   Glaucoma    Gout    History of acute anterior wall MI 03/17/2016   History of colonoscopy 04/27/2008   History of COVID-19 06/27/2022   HLD (hyperlipidemia) 04/27/2008   Check lipid panel.  She will continue Lipitor .   Hot flash, menopausal 02/07/2018   Will check with our clinical pharmacist regarding the black cohosh and primrose in this patient.   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   Iron deficiency anemia, unspecified 04/27/2008   Ischemic cardiomyopathy 10/02/2016   Joint pain    Left bundle branch block 12/03/2018   Chronic.  She will follow with cardiology.   Major depressive disorder 07/28/2015   Chronic ongoing  issue.  Has worsened somewhat recently.  Notes passive SI.  Advised if she develops intent or plan to harm herself she needs to go to the emergency room.  I advised her to contact her psychiatrist to arrange for follow-up and she was willing to do so.  She does have protective factors in place including her husband, dog, and nephew.   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  She will follow with cardiology.  We will send her EKG to her psychiatrist and have CMA contact the psychiatry office to inform them that the EKG was faxed and of the results.   Rash 02/07/2018  Possibly related to folliculitis or some other undetermined cause of her rash.  She will trial over-the-counter antibiotic ointment and if not beneficial she will let us  know and we can refer her to dermatology.   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 Wonda and Dr. 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  She will continue [name]  10 mg daily.   Uterine leiomyoma 04/27/2008   Vitamin D  deficiency 09/15/2010    Past Surgical History:  Procedure Laterality Date   APPENDECTOMY     BIV ICD INSERTION CRT-D N/A 01/02/2022   Procedure: BIV ICD INSERTION CRT-D;  Surgeon: Cindie Ole DASEN, MD;  Location: [facility] INVASIVE CV LAB;  Service: Cardiovascular;  Laterality: N/A;   CARDIAC CATHETERIZATION N/A 03/17/2016   Procedure: Left Heart Cath and Coronary Angiography;  Surgeon: Ozell Wonda, MD;  Location: [facility]  INVASIVE CV LAB;  Service: Cardiovascular;  Laterality: N/A;   CARDIAC CATHETERIZATION N/A 03/17/2016   Procedure: Coronary Stent Intervention;  Surgeon: Ozell Wonda, MD;  Location: [facility] INVASIVE CV LAB;  Service: Cardiovascular;  Laterality: N/A;   COLONOSCOPY WITH PROPOFOL  N/A 08/13/2017   Procedure: COLONOSCOPY WITH PROPOFOL ;  Surgeon: Therisa Bi, MD;  Location: [facility] ENDOSCOPY;  Service: Gastroenterology;  Laterality: N/A;   COLONOSCOPY WITH PROPOFOL  N/A 03/29/2020   Procedure: COLONOSCOPY WITH PROPOFOL ;  Surgeon: Jinny Carmine, MD;  Location: [facility] ENDOSCOPY;  Service: Endoscopy;  Laterality: N/A;   EXPLORATION POST OPERATIVE OPEN HEART N/A 10/24/2022   Procedure: EXPLORATION POST OPERATIVE OPEN HEART;  Surgeon: Maryjane Mt, MD;  Location: [facility] OR;  Service: Open Heart Surgery;  Laterality: N/A;   MITRAL VALVE REPLACEMENT N/A 10/24/2022   Procedure: MITRAL VALVE (MV) REPLACEMENT USING A 29 MM MEDTRONIC MOSAIC 310;  Surgeon: Maryjane Mt, MD;  Location: [facility] OR;  Service: Open Heart Surgery;  Laterality: N/A;   RIGHT HEART CATH N/A 10/15/2022   Procedure: RIGHT HEART CATH;  Surgeon: Wonda Ozell, MD;  Location: [facility] INVASIVE CV LAB;  Service: Cardiovascular;  Laterality: N/A;   RIGHT/LEFT HEART CATH AND CORONARY ANGIOGRAPHY N/A 08/02/2022   Procedure: RIGHT/LEFT HEART CATH AND CORONARY ANGIOGRAPHY;  Surgeon: Cherrie Toribio SAUNDERS, MD;  Location: [facility] INVASIVE CV LAB;  Service: Cardiovascular;  Laterality: N/A;   SMALL BOWEL REPAIR     TEE WITHOUT CARDIOVERSION N/A 08/02/2022   Procedure: TRANSESOPHAGEAL ECHOCARDIOGRAM (TEE);  Surgeon: Cherrie Toribio SAUNDERS, MD;  Location: [facility] ENDOSCOPY;  Service: Cardiovascular;  Laterality: N/A;   TEE WITHOUT CARDIOVERSION N/A 10/04/2022   Procedure: TRANSESOPHAGEAL ECHOCARDIOGRAM (TEE);  Surgeon: Wonda Ozell, MD;  Location: [facility] INVASIVE CV LAB;  Service: Cardiovascular;  Laterality: N/A;   TEE WITHOUT CARDIOVERSION N/A 10/24/2022   Procedure: TRANSESOPHAGEAL  ECHOCARDIOGRAM (TEE);  Surgeon: Maryjane Mt, MD;  Location: [facility] OR;  Service: Open Heart Surgery;  Laterality: N/A;   TONSILLECTOMY     TRANSCATHETER MITRAL EDGE TO EDGE REPAIR N/A 10/04/2022   Procedure: MITRAL VALVE REPAIR;  Surgeon: Wonda Ozell, MD;  Location: [facility] INVASIVE CV LAB;  Service: Cardiovascular;  Laterality: N/A;   UTERINE FIBROID SURGERY      Family Psychiatric History: No Additional  Family History:  Family History  Problem Relation Age of Onset   Anxiety disorder Mother  Paranoid behavior Mother    Hypertension Mother    Dementia Mother    High Cholesterol Mother    Diabetes Mother    Hyperlipidemia Mother    Depression Mother    Hypertension Father    High Cholesterol Father    Mood Disorder Sister    Stroke Sister    Anxiety disorder Maternal Aunt    Tuberculosis Paternal Grandfather    Drug abuse Cousin     Social History:  Social History   Socioeconomic History   Marital status: Widowed    Spouse name: Macyn Shropshire   Number of children: 0   Years of education: 16   Highest education level: Bachelor's degree (e.g., BA, AB, BS)  Occupational History   Occupation: Retired    Comment: Runner, broadcasting/film/video  Tobacco Use   Smoking status: Former    Current packs/day: 0.00    Average packs/day: 0.5 packs/day for 25.0 years (12.5 ttl pk-yrs)    Types: Cigarettes    Start date: 04/13/1972    Quit date: 04/13/1997    Years since quitting: 27.2   Smokeless tobacco: Never  Vaping Use   Vaping status: Never Used  Substance and Sexual Activity   Alcohol use: Not Currently    Comment: Occasional use   Drug use: No   Sexual activity: Not Currently  Other Topics Concern   Not on file  Social History Narrative   Lives in [place] with spouse.  No children.   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   Left-handed   Lives in a two story home       Social Drivers of Health    Financial Resource Strain: Low Risk  (10/18/2023)   Received from [facility]   Overall Financial Resource Strain (CARDIA)    Difficulty of Paying Living Expenses: Not hard at all  Recent Concern: Financial Resource Strain - Medium Risk (08/08/2023)   Overall Financial Resource Strain (CARDIA)    Difficulty of Paying Living Expenses: Somewhat hard  Food Insecurity: No Food Insecurity (10/18/2023)   Received from [facility]   Hunger Vital Sign    Within the past 12 months, you worried that your food would run out before you got the money to buy more.: Never true    Within the past 12 months, the food you bought just didn't last and you didn't have money to get more.: Never true  Transportation Needs: No Transportation Needs (10/18/2023)   Received from [facility] - Transportation    In the past 12 months, has lack of transportation kept you from medical appointments or from getting medications?: No    Lack of Transportation (Non-Medical): No  Physical Activity: Inactive (08/08/2023)   Exercise Vital Sign    Days of Exercise per Week: 0 days    Minutes of Exercise per Session: 0 min  Stress: Stress Concern Present (08/08/2023)   [employer] of Occupational Health - Occupational Stress Questionnaire    Feeling of Stress : Very much  Social Connections: Moderately Integrated (08/08/2023)   Social Connection and Isolation Panel    Frequency of Communication with Friends and Family: More than three times a week    Frequency of Social Gatherings with Friends and Family: Never    Attends Religious Services: More than 4 times per year    Active Member of Clubs or Organizations: No  Attends Banker Meetings: Never    Marital Status: Married    Allergies:  Allergies  Allergen Reactions   Tetracycline Swelling   Meperidine Nausea And Vomiting    (Demerol)Nausea   Entresto  [Sacubitril -Valsartan ] Other  (See Comments)    Shortness of Breath    Metabolic Disorder Labs: Lab Results  Component Value Date   HGBA1C 5.9 04/23/2024   MPG 111.15 05/07/2022   MPG 139.85 03/31/2020   No results found for: PROLACTIN Lab Results  Component Value Date   CHOL 140 04/23/2024   TRIG 55.0 04/23/2024   HDL 45.90 04/23/2024   CHOLHDL 3 04/23/2024   VLDL 11.0 04/23/2024   LDLCALC 83 04/23/2024   LDLCALC 75 03/20/2023   Lab Results  Component Value Date   TSH 0.79 12/28/2022   TSH 0.64 09/05/2021    Therapeutic Level Labs: No results found for: LITHIUM No results found for: VALPROATE No results found for: CBMZ  Current Medications: Current Outpatient Medications  Medication Sig Dispense Refill   aspirin  EC 81 MG tablet Take 1 tablet (81 mg total) by mouth daily. Swallow whole. 30 tablet 12   atorvastatin  (LIPITOR ) 80 MG tablet Take 1 tablet (80 mg total) by mouth daily. 90 tablet 3   carvedilol  (COREG ) 12.5 MG tablet Take 1 tablet (12.5 mg total) by mouth 2 (two) times daily with a meal. Please call [phone] to schedule an appointment for future refills. Thank you. 60 tablet 1   Deutetrabenazine  (AUSTEDO  XR PATIENT TITRATION) 6 & 12 & 24 MG TEPK Take 1 tablet by mouth daily.     donepezil (ARICEPT) 10 MG tablet Take 10 mg by mouth at bedtime.     dorzolamide -timolol  (COSOPT ) 22.3-6.8 MG/ML ophthalmic solution Place 1 drop into both eyes 2 (two) times daily.      fluticasone  (FLONASE ) 50 MCG/ACT nasal spray Place 2 sprays into both nostrils daily. 16 g 6   fluvoxaMINE  (LUVOX ) 100 MG tablet Take 1 tablet (100 mg total) by mouth at bedtime. 30 tablet 0   furosemide  (LASIX ) 40 MG tablet Take 40 mg by mouth as needed.     gabapentin  (NEURONTIN ) 400 MG capsule Take 2 capsules (800 mg total) by mouth 3 (three) times daily. 180 capsule 2   hydrOXYzine  (ATARAX ) 10 MG tablet Take 1 tablet (10 mg total) by mouth 3 (three) times daily as needed. 90 tablet 0   isosorbide  mononitrate (IMDUR )  30 MG 24 hr tablet Take 1 tablet by mouth once daily 90 tablet 3   QUEtiapine  (SEROQUEL ) 200 MG tablet Take 1 tablet (200 mg total) by mouth at bedtime. 30 tablet 2   valsartan  (DIOVAN ) 80 MG tablet Take 1 tablet by mouth twice daily 180 tablet 0   No current facility-administered medications for this visit.     Musculoskeletal: Strength & Muscle Tone: within normal limits Gait & Station: normal Patient leans: N/A  Psychiatric Specialty Exam: Review of Systems  Constitutional: Negative.   HENT: Negative.    Eyes: Negative.   Respiratory: Negative.    Cardiovascular: Negative.   Gastrointestinal: Negative.   Endocrine: Negative.   Genitourinary: Negative.   Musculoskeletal: Negative.   Skin: Negative.   Allergic/Immunologic: Negative.   Neurological: Negative.   Hematological: Negative.   Psychiatric/Behavioral:  Positive for dysphoric mood. The patient is nervous/anxious.     Blood pressure (!) 145/67, pulse 76, temperature 98 F (36.7 C), temperature source Oral, height 5' 2 (1.575 m), weight 147 lb (66.7 kg).Body mass index is 26.89  kg/m.  General Appearance: Well Groomed  Eye Contact:  Good  Speech:  Clear and Coherent  Volume:  Normal  Mood:  Anxious and Depressed  Affect:  Appropriate  Thought Process:  Coherent  Orientation:  Full (Time, Place, and Person)  Thought Content: Logical   Suicidal Thoughts:  Yes.  without intent/plan  Homicidal Thoughts:  No  Memory:  Immediate;   Good Recent;   Good Remote;   Good  Judgement:  Good  Insight:  Good  Psychomotor Activity:  Normal  Concentration:  Concentration: Good and Attention Span: Good  Recall:  Good  Fund of Knowledge: Good  Language: Good  Akathisia:  No  Handed:  Right  AIMS (if indicated):   Assets:  Communication Skills Desire for Improvement Financial Resources/Insurance  ADL's:  Intact  Cognition: WNL  Sleep:  Good   Screenings: GAD-7    Flowsheet Row Office Visit from 06/12/2024 in [facility] [employer] Visit from 06/09/2024 in [facility] Visit from 05/13/2024 in [facility] Visit from 04/23/2024 in [facility] [employer] Visit from 04/15/2024 in [facility]  Total GAD-7 Score 16 19 21 18 17    PHQ2-9    Flowsheet Row Office Visit from 06/12/2024 in [facility] [employer] Visit from 06/09/2024 in [facility] Visit from 05/13/2024 in [facility] Visit from 04/23/2024 in [facility] Visit from 04/15/2024 in [facility]  PHQ-2 Total Score 6 6 6 6 6   PHQ-9 Total Score 14 19 23 17 16    Flowsheet Row Office Visit from 06/09/2024 in [facility] Office Visit from 05/13/2024 in [facility] Office Visit from 04/15/2024 in [facility]  C-SSRS RISK CATEGORY High Risk Low Risk Moderate Risk     Assessment and Plan:  Assessment - Diagnosis: Mixed obsessional thoughts and acts [F42.2]  2. Grieving [F43.21]  3. Generalized anxiety disorder [F41.1]   - Progress: Patient reports that she had a good trip to [place] over the [facility] Day holiday and noticed that while she was on the trip she did not have any OCD symptoms.  Patient believes that she has OCD symptoms whenever she is by herself and isolated in her home now that she is without her husband. - Risk Factors: Worsening symptoms, suicide risk  Plan - Medications:  Continue to take Gabapentin  at 800mg  TID for anxiety. Pt reports she increased the dose on her own, and is stating no complications of  sedation. Increase 200mg  Fluvox, once a day at bedtime for OCD.  Continue Quetiapine  200mg   once a day at night, prescribed by another provider patient was reminded of sedation nature of the medication to take her time and waking up as well as monitoring sedation in the mornings and to notify the clinician should it become an issue. Stop Depakote.  Start Austedo  Titration (12mg , 18mg , 24mg , 30mg ) pack once daily for TD. Pt educated of sedation and fatigue and educated to take at night.   - Psychotherapy: Pt with [facility] for therapy.  Patient is also being recommended to be placed on wait list for [facility] who specializes with OCD here at the [facility] office. - Education: Patient has been  educated on the evidence-based recommendations for therapy and pharmacotherapy with OCD symptoms.  As patient is identifying severe symptoms of rituals with OCD patient is recommended to start on medications while waiting for a therapy referral.  Patient has been educated on medications with medication purpose, side effects and adverse reactions.  Patient has also been educated on the need for community support and community involvement during isolation as the patient has a recent widow of 2 months ago.  Patient has been encouraged to seek social groups within her faith-based or community base groups.  Patient has also been educated on importance of following up weekly due to severe symptoms to monitor medication effectiveness as well as possible adjustments. - Follow-Up: Patient to follow-up once Neurology is done with med management.  - Referrals: Patient has been referred to outside therapy as well as being placed on the wait list for Almarie here at [facility] office. - Safety Planning: The patient has been educated, if they should have suicidal thoughts with or without a plan to call 911, or go to the closest emergency department.  Pt verbalized understanding.  Pt denies firearms within the home.  Pt also agrees  to call the clinic should they have worsening symptoms before the next appointment.   Patient/Guardian was advised Release of Information must be obtained prior to any record release in order to collaborate their care with an outside provider. Patient/Guardian was advised if they have not already done so to contact the registration department to sign all necessary forms in order for us  to release information regarding their care.   Consent: Patient/Guardian gives verbal consent for treatment and assignment of benefits for services provided during this visit. Patient/Guardian expressed understanding and agreed to proceed.    Dorn Jama Der, NP 06/23/2024, 1:35 PM

## 2024-06-25 ENCOUNTER — Other Ambulatory Visit: Payer: Self-pay | Admitting: *Deleted

## 2024-06-25 NOTE — Patient Outreach (Signed)
 Complex Care Management   Visit Note  06/25/2024  Name:  Melanie Ray MRN: [id number] DOB: 1951-03-17  Situation: Referral received for Complex Care Management related to Mental/Behavioral Health diagnosis Grief and Loss, depression and anxiety I obtained verbal consent from Patient.  Visit completed with Patient  on the phone  Background:   Past Medical History:  Diagnosis Date   ABLA (acute blood loss anemia) 10/24/2022   AICD (automatic cardioverter/defibrillator) present    Allergic rhinitis 09/25/2022   Back pain    Benign neoplasm of colon 04/27/2008   CAD (coronary artery disease) 04/11/2016   S/p ant STEMI 5/17: LHC >> LAD proximal 80%, mid 80%, distal 50%, ostial D1 60%; LCx with LPDA lesion 30%; RCA Mild calcification with no significant stenosis in a medium caliber, nondominant RCA; LVEF is estimated at 45% with inferoapical and lateral wall akinesis >> PCI: PCI: 3.5 x 24 mm Promus DES to prox LAD, 2.5 x 12 mm Promus DES to mid LAD.   Cerebellar stroke 03/30/2020   2021 MRI - Small, old left cerebellar infarct    Chest pain    Chronic HFrEF (heart failure with reduced ejection fraction) 10/25/2022   Chronic systolic CHF (congestive heart failure) 03/21/2016   Echo 01/30/17: Diff HK, mild focal basal septal hypertrophy, EF 30-35, mild AI, MAC, mild MR // Echo 06/08/16: Mild focal basal septal hypertrophy, EF 25-30%, diff HK, ant-septal AK, Gr 1 DD, mild AI, MAC, mild MR, PASP 37 mmHg // Echo 03/18/16: EF 30-35%, ant-septal AK, Gr 1 DD, mild MR, severe LAE.     Constipation    Diarrhea 01/02/2023   Dizziness    Dyspnea    Endotracheal tube present 10/24/2022   Essential hypertension 04/27/2008   Well-controlled today.  She will continue valsartan  80 mg twice a day, Imdur  30 mg daily, Lasix  20 mg every other day, [name]  10 mg daily, and carvedilol  12.5 mg twice daily.   Finger fracture 08/15/2021   She reports this is well-healing.  She will continue to see orthopedics.    Generalized anxiety disorder 04/27/2008   Glaucoma    Gout    History of acute anterior wall MI 03/17/2016   History of colonoscopy 04/27/2008   History of COVID-19 06/27/2022   HLD (hyperlipidemia) 04/27/2008   Check lipid panel.  She will continue Lipitor .   Hot flash, menopausal 02/07/2018   Will check with our clinical pharmacist regarding the black cohosh and primrose in this patient.   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   Iron deficiency anemia, unspecified 04/27/2008   Ischemic cardiomyopathy 10/02/2016   Joint pain    Left bundle branch block 12/03/2018   Chronic.  She will follow with cardiology.   Major depressive disorder 07/28/2015   Chronic ongoing issue.  Has worsened somewhat recently.  Notes passive SI.  Advised if she develops intent or plan to harm herself she needs to go to the emergency room.  I advised her to contact her psychiatrist to arrange for follow-up and she was willing to do so.  She does have protective factors in place including her husband, dog, and nephew.   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  She will follow with cardiology.  We will send her EKG to her psychiatrist and have CMA contact the psychiatry office to inform them that the EKG was faxed and of the results.   Rash 02/07/2018   Possibly related to folliculitis or some other undetermined cause of her rash.  She will trial over-the-counter antibiotic ointment and if not beneficial she will let us  know and we can refer her to dermatology.   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 Wonda and Dr. 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  She will continue [name]  10 mg daily.   Uterine leiomyoma 04/27/2008   Vitamin D  deficiency 09/15/2010    Assessment: Patient Reported Symptoms:  Cognitive Cognitive Status: Alert and oriented to person, place, and time, Insightful and able to interpret abstract concepts, Struggling with memory recall Cognitive/Intellectual Conditions Management [RPT]: None reported or documented in medical history or problem list   Health Maintenance Behaviors: Annual physical exam Healing Pattern: Slow Health Facilitated by: Prayer/meditation, Stress management  Neurological Neurological Review of Symptoms: Dizziness Neurological Comment: Per patietn, went to doctor and was told to go to the emegency room but did not want to go  HEENT        Cardiovascular Cardiovascular Symptoms Reported: Dizziness (patient has congestive heart failure) Does patient have uncontrolled Hypertension?: No Cardiovascular Management Strategies: Medication therapy Cardiovascular Comment: Followed by cardiology  Respiratory Respiratory Symptoms Reported: No symptoms reported    Endocrine Endocrine Symptoms Reported: No symptoms reported    Gastrointestinal        Genitourinary Genitourinary Symptoms Reported: No symptoms reported    Integumentary Integumentary Symptoms Reported: No symptoms reported    Musculoskeletal Musculoskelatal Symptoms Reviewed: Unsteady gait Additional Musculoskeletal Details: stumbles alot due to jheadaches, does not feel that she needs a cain or walker   Falls in the past year?: Yes Number of falls in past year: 1 or less Was there an injury with Fall?:  Yes Fall Risk Category Calculator: 2 Patient Fall Risk Level: Moderate Fall Risk Patient at Risk for Falls Due to: History of fall(s)  Psychosocial Psychosocial Symptoms Reported: Depression - if selected complete PHQ 2-9, Intrusive thoughts or memories Additional Psychological Details: OCD, Anxiety and Depression-symptoms exacerbated by loss of spouse in February   Major Change/Loss/Stressor/Fears (CP): Death of a loved one Behaviors When Feeling Stressed/Fearful: prayer, positive self talk, spending time with family Techniques to [place] with Loss/Stress/Change: Diversional activities, Medication, Spiritual practice(s) Quality of Family Relationships: supportive Do you feel physically threatened by others?: No    06/25/2024    PHQ2-9 Depression Screening   Little interest or pleasure in doing things Nearly every day  Feeling down, depressed, or hopeless Nearly every day  PHQ-2 - Total Score 6  Trouble falling or staying asleep, or sleeping too much Not at all  Feeling tired or having little energy Nearly every day  Poor appetite or overeating  Not at all  Feeling bad about yourself - or that you are a failure or have let yourself or your family down Nearly every day  Trouble concentrating on things, such as reading the newspaper or watching television Several days  Moving or speaking so slowly that other people could have noticed.  Or the opposite - being so fidgety or restless that you have been moving around a lot more than usual Not at all  Thoughts that you would be better off dead, or hurting yourself in some way Several days (No plan to hurt self, just wants to go to sleep and not wake up. I have a dog that has alot of medical needs, I do alot for her, I would feel guilty if I left her, I feel like have to take care of her,  she keeps me gong)  PHQ2-9 Total Score 14  If you checked off any problems, how difficult have these problems made it for you to do your work, take care of  things at home, or get along with other people Very difficult  Depression Interventions/Treatment      There were no vitals filed for this visit.  Medications Reviewed Today     Reviewed by Ermalinda Lenn HERO, LCSW (Social Worker) on 06/25/24 at 1535  Med List Status: <None>   Medication Order Taking? Sig Documenting Provider Last Dose Status Informant  aspirin  EC 81 MG tablet [id number] Yes Take 1 tablet (81 mg total) by mouth daily. Swallow whole. Lenetta No D, NP  Active   atorvastatin  (LIPITOR ) 80 MG tablet [id number] Yes Take 1 tablet (80 mg total) by mouth daily. Bair, Luke, MD  Active   carvedilol  (COREG ) 12.5 MG tablet [id number] Yes Take 1 tablet (12.5 mg total) by mouth 2 (two) times daily with a meal. Please call [phone] to schedule an appointment for future refills. Thank you. Cindie Ole DASEN, MD  Active   Deutetrabenazine  (AUSTEDO  XR PATIENT TITRATION) 6 & 12 & 24 MG TEPK [id number] Yes Take 1 tablet by mouth daily. Saucier, Dorn Ruth, NP  Active   donepezil (ARICEPT) 10 MG tablet [id number] Yes Take 10 mg by mouth at bedtime. [provider]  Active   dorzolamide -timolol  (COSOPT ) 22.3-6.8 MG/ML ophthalmic solution [id number] Yes Place 1 drop into both eyes 2 (two) times daily.  [provider]  Active Self, Pharmacy Records           Med Note JOYICE ILEANA FALCON   Dju Mar 17, 2016 10:15 PM)    fluticasone  (FLONASE ) 50 MCG/ACT nasal spray [id number]  Place 2 sprays into both nostrils daily. Bair, Kalpana, MD  Active   fluvoxaMINE  (LUVOX ) 100 MG tablet [id number] Yes Take 2 tablets (200 mg total) by mouth at bedtime. Saucier, Dorn Ruth, NP  Active   furosemide  (LASIX ) 40 MG tablet [id number] Yes Take 40 mg by mouth as needed. [provider]  Active   gabapentin  (NEURONTIN ) 400 MG capsule [id number] Yes Take 2 capsules (800 mg total) by mouth 3 (three) times daily. Saucier, Dorn Ruth, NP  Active   hydrOXYzine  (ATARAX ) 10 MG tablet [id number] Yes Take  1 tablet (10 mg total) by mouth 3 (three) times daily as needed. Saucier, Dorn Ruth, NP  Active   isosorbide  mononitrate (IMDUR ) 30 MG 24 hr tablet [id number] Yes Take 1 tablet by mouth once daily Wonda Sharper, MD  Active   QUEtiapine  (SEROQUEL ) 200 MG tablet [id number] Yes Take 1 tablet (200 mg total) by mouth at bedtime. Saucier, Dorn Ruth, NP  Active   valsartan  (DIOVAN ) 80 MG tablet [id number] Yes Take 1 tablet by mouth twice daily Bensimhon, Toribio SAUNDERS, MD  Active            Med Note [place], [address]  Thu Aug 08, 2023  1:46 PM) Takes 1 tablet daily            Recommendation:   PCP Follow-up Continue with ongoing mental health follow up with [facility] Please review resources for grief share and grief support  Follow Up Plan:   Telephone follow up appointment date/time:  07/10/24  Lenn Mean, LCSW [place]  [facility], [facility] Licensed Clinical Social Worker  Direct Dial: [phone]

## 2024-06-25 NOTE — Patient Instructions (Signed)
 Visit Information  Thank you for taking time to visit with me today. Please don't hesitate to contact me if I can be of assistance to you before our next scheduled appointment.  Our next appointment is by telephone on 07/10/24 at 11am Please call the care guide team at [phone] if you need to cancel or reschedule your appointment.   Following is a copy of your care plan:   Goals Addressed             This Visit's Progress    VBCI Social Work Care Plan       Problems:   Lacks knowledge of how to connect to grief support resources in the community  CSW Clinical Goal(s):   Over the next 90 days the Patient will will follow up with consider follow up with grief counseling through Hospice and Palliative care or Grief Share groups in the area as directed by Social Work.  Interventions:  Mental Health:  Evaluation of current treatment plan related to Anxiety, Depression, Obsessive Compulsive Disorder Confirmed that patient is active with [facility] for Medication management and ongoing mental health support  Active listening / Reflection utilized Discussed referral options to connect for ongoing therapy: Discussed options for  grief counseling  Emotional Support Provided Motivational Interviewing employed Participation in support groups encouraged : grief share through local churches-resources provided PHQ2/PHQ9 completed GAD 7 completed Assessed for suicide ideations/self harm-confirmed suicide ideations with no plan-patient agreeable to contacting 988 in the event of a mental health crisis Solution-Focued Strategies employed: relying on spiritual belief system, positive self talk, increasing involvement in social activities, accepting support from family and friend  Patient Goals/Self-Care Activities:  Please continue to follow up with [facility] for ongoing mental health support Please review resources for grief support once received  by mail  Plan:   Telephone follow up appointment with care management team member scheduled for:  07/10/24        Please call the Suicide and Crisis Lifeline: 988 call the [place]  National Suicide Prevention Lifeline: 4355022330 or TTY: (613)299-4354 TTY (317) 090-3523) to talk to a trained counselor call 1-800-273-TALK (toll free, 24 hour hotline) go to [facility]: [facility] [address] [place] [phone]) if you are experiencing a Mental Health or Behavioral Health Crisis or need someone to talk to.  Patient verbalizes understanding of instructions and care plan provided today and agrees to view in MyChart. Active MyChart status and patient understanding of how to access instructions and care plan via MyChart confirmed with patient.     Kiesha Ensey, LCSW [place]  [facility], [facility] Licensed Clinical Social Worker  Direct Dial: [phone]

## 2024-07-01 ENCOUNTER — Other Ambulatory Visit (HOSPITAL_COMMUNITY): Payer: Self-pay | Admitting: Internal Medicine

## 2024-07-02 NOTE — Telephone Encounter (Signed)
Fine to refill thanks.

## 2024-07-03 ENCOUNTER — Ambulatory Visit (INDEPENDENT_AMBULATORY_CARE_PROVIDER_SITE_OTHER): Payer: Medicare Other

## 2024-07-03 DIAGNOSIS — I5022 Chronic systolic (congestive) heart failure: Secondary | ICD-10-CM

## 2024-07-04 ENCOUNTER — Ambulatory Visit: Payer: Self-pay | Admitting: Cardiology

## 2024-07-04 LAB — CUP PACEART REMOTE DEVICE CHECK
Battery Remaining Longevity: 55 mo
Battery Remaining Percentage: 61 %
Battery Voltage: 2.98 V
Brady Statistic AP VP Percent: 93 %
Brady Statistic AP VS Percent: 1 %
Brady Statistic AS VP Percent: 6 %
Brady Statistic AS VS Percent: 1 %
Brady Statistic RA Percent Paced: 93 %
Date Time Interrogation Session: 20250829020018
HighPow Impedance: 59 Ohm
HighPow Impedance: 59 Ohm
Implantable Lead Connection Status: 753985
Implantable Lead Connection Status: 753985
Implantable Lead Connection Status: 753985
Implantable Lead Implant Date: 20230228
Implantable Lead Implant Date: 20230228
Implantable Lead Implant Date: 20230228
Implantable Lead Location: 753858
Implantable Lead Location: 753859
Implantable Lead Location: 753860
Implantable Lead Model: 3830
Implantable Pulse Generator Implant Date: 20230228
Lead Channel Impedance Value: 410 Ohm
Lead Channel Impedance Value: 440 Ohm
Lead Channel Impedance Value: 560 Ohm
Lead Channel Pacing Threshold Amplitude: 0.5 V
Lead Channel Pacing Threshold Amplitude: 0.75 V
Lead Channel Pacing Threshold Amplitude: 2.75 V
Lead Channel Pacing Threshold Pulse Width: 0.05 ms
Lead Channel Pacing Threshold Pulse Width: 0.5 ms
Lead Channel Pacing Threshold Pulse Width: 0.5 ms
Lead Channel Sensing Intrinsic Amplitude: 12 mV
Lead Channel Sensing Intrinsic Amplitude: 3.1 mV
Lead Channel Setting Pacing Amplitude: 0.25 V
Lead Channel Setting Pacing Amplitude: 2 V
Lead Channel Setting Pacing Amplitude: 2 V
Lead Channel Setting Pacing Pulse Width: 0.05 ms
Lead Channel Setting Pacing Pulse Width: 0.5 ms
Lead Channel Setting Sensing Sensitivity: 0.5 mV
Pulse Gen Serial Number: 8904264
Zone Setting Status: 755011

## 2024-07-05 NOTE — Assessment & Plan Note (Addendum)
 Pt experiencing fatigue, dizziness and unsteady gait for the past few weeks.  Patient reports that she has chronic  dizziness but has worsened recently. EKG shows nonspecific changes compared to the previous EKG. Discussed the case with Dr. Glendia and reviewed the EKG.  Dr. Glendia and I agreed for patient to be evaluated in the ED for further work up. Patient politely declined immediate evaluation, expressed reluctance to go to ED and refused EMS transport.   Dr. Glendia attempted to convince the patient however the patient declined, took her belongings and left the room.

## 2024-07-07 ENCOUNTER — Other Ambulatory Visit: Payer: Self-pay | Admitting: Psychiatry

## 2024-07-10 ENCOUNTER — Other Ambulatory Visit: Payer: Self-pay | Admitting: *Deleted

## 2024-07-11 NOTE — Patient Instructions (Signed)
 Visit Information  Thank you for taking time to visit with me today. Please don't hesitate to contact me with any additional community resource needs.  no further scheduled appointments.      Please call the care guide team at [phone] if you need to cancel, schedule, or reschedule an appointment.   Please call the Suicide and Crisis Lifeline: 988 call the [place]  National Suicide Prevention Lifeline: 317-770-3024 or TTY: 701 503 5043 TTY (316) 263-0062) to talk to a trained counselor call 1-800-273-TALK (toll free, 24 hour hotline) if you are experiencing a Mental Health or Behavioral Health Crisis or need someone to talk to.  Chace Klippel, LCSW [place]  [facility], [facility] Licensed Clinical Social Worker  Direct Dial: [phone]

## 2024-07-11 NOTE — Patient Outreach (Signed)
 Complex Care Management   Visit Note  07/11/2024  Name:  Melanie Ray MRN: [id number] DOB: 06-Oct-1951  Situation: Referral received for Complex Care Management related to Mental/Behavioral Health diagnosis depression-grief and loss I obtained verbal consent from Patient.  Visit completed with Patient  on the phone on 07/10/24.  Background:   Past Medical History:  Diagnosis Date   ABLA (acute blood loss anemia) 10/24/2022   AICD (automatic cardioverter/defibrillator) present    Allergic rhinitis 09/25/2022   Back pain    Benign neoplasm of colon 04/27/2008   CAD (coronary artery disease) 04/11/2016   S/p ant STEMI 5/17: LHC >> LAD proximal 80%, mid 80%, distal 50%, ostial D1 60%; LCx with LPDA lesion 30%; RCA Mild calcification with no significant stenosis in a medium caliber, nondominant RCA; LVEF is estimated at 45% with inferoapical and lateral wall akinesis >> PCI: PCI: 3.5 x 24 mm Promus DES to prox LAD, 2.5 x 12 mm Promus DES to mid LAD.   Cerebellar stroke 03/30/2020   2021 MRI - Small, old left cerebellar infarct    Chest pain    Chronic HFrEF (heart failure with reduced ejection fraction) 10/25/2022   Chronic systolic CHF (congestive heart failure) 03/21/2016   Echo 01/30/17: Diff HK, mild focal basal septal hypertrophy, EF 30-35, mild AI, MAC, mild MR // Echo 06/08/16: Mild focal basal septal hypertrophy, EF 25-30%, diff HK, ant-septal AK, Gr 1 DD, mild AI, MAC, mild MR, PASP 37 mmHg // Echo 03/18/16: EF 30-35%, ant-septal AK, Gr 1 DD, mild MR, severe LAE.     Constipation    Diarrhea 01/02/2023   Dizziness    Dyspnea    Endotracheal tube present 10/24/2022   Essential hypertension 04/27/2008   Well-controlled today.  She will continue valsartan  80 mg twice a day, Imdur  30 mg daily, Lasix  20 mg every other day, [name]  10 mg daily, and carvedilol  12.5 mg twice daily.   Finger fracture 08/15/2021   She reports this is well-healing.  She will continue to see orthopedics.    Generalized anxiety disorder 04/27/2008   Glaucoma    Gout    History of acute anterior wall MI 03/17/2016   History of colonoscopy 04/27/2008   History of COVID-19 06/27/2022   HLD (hyperlipidemia) 04/27/2008   Check lipid panel.  She will continue Lipitor .   Hot flash, menopausal 02/07/2018   Will check with our clinical pharmacist regarding the black cohosh and primrose in this patient.   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   Iron deficiency anemia, unspecified 04/27/2008   Ischemic cardiomyopathy 10/02/2016   Joint pain    Left bundle branch block 12/03/2018   Chronic.  She will follow with cardiology.   Major depressive disorder 07/28/2015   Chronic ongoing issue.  Has worsened somewhat recently.  Notes passive SI.  Advised if she develops intent or plan to harm herself she needs to go to the emergency room.  I advised her to contact her psychiatrist to arrange for follow-up and she was willing to do so.  She does have protective factors in place including her husband, dog, and nephew.   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  She will follow with cardiology.  We will send her EKG to her psychiatrist and have CMA contact the psychiatry office to inform them that the EKG was faxed and of the results.   Rash 02/07/2018   Possibly related to folliculitis or some other undetermined cause of her rash.  She will trial over-the-counter antibiotic ointment and if not beneficial she will let us  know and we can refer her to dermatology.   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 Wonda and Dr. 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  She will continue [name]  10 mg daily.   Uterine leiomyoma 04/27/2008   Vitamin D  deficiency 09/15/2010    Assessment: Patient Reported Symptoms:  Cognitive Cognitive Status: Alert and oriented to person, place, and time, Insightful and able to interpret abstract concepts, Struggling with memory recall Cognitive/Intellectual Conditions Management [RPT]: None reported or documented in medical history or problem list   Health Maintenance Behaviors: Annual physical exam Healing Pattern: Slow Health Facilitated by: Prayer/meditation, Stress management  Neurological Neurological Review of Symptoms: Dizziness Neurological Comment: getting out and seeing  family helps  HEENT HEENT Symptoms Reported: No symptoms reported      Cardiovascular Cardiovascular Symptoms Reported: Dizziness Does patient have uncontrolled Hypertension?: No Cardiovascular Management Strategies: Medication therapy Cardiovascular Comment: continues to be followed by cardiology  Respiratory Respiratory Symptoms Reported: No symptoms reported    Endocrine Endocrine Symptoms Reported: No symptoms reported    Gastrointestinal Gastrointestinal Symptoms Reported: No symptoms reported      Genitourinary Genitourinary Symptoms Reported: No symptoms reported    Integumentary Integumentary Symptoms Reported: No symptoms reported    Musculoskeletal Musculoskelatal Symptoms Reviewed: Unsteady gait Additional Musculoskeletal Details: stumbles alot due to headaches-take her time,        Psychosocial Psychosocial Symptoms Reported: Depression - if selected complete PHQ 2-9 Additional Psychological  Details: OCD, Anxiety, Depression Behavioral Health Comment: Helped and enjoyed brother in [place], birthday party and lunch with friends, season tickets to [employer] football-will be with friends, Major Change/Loss/Stressor/Fears (CP): Death of a loved one Techniques to [place] with Loss/Stress/Change: Diversional activities, Spiritual practice(s) Quality of Family Relationships: supportive Do you feel physically threatened by others?: No    07/11/2024    PHQ2-9 Depression Screening   Little interest or pleasure in doing things Several days  Feeling down, depressed, or hopeless Several days  PHQ-2 - Total Score 2  Trouble falling or staying asleep, or sleeping too much Not at all  Feeling tired or having little energy Several days  Poor appetite or overeating  Not at all  Feeling bad about yourself - or that you are a failure or have let yourself or your family down Several days  Trouble concentrating on things, such as reading the newspaper or watching television Several days  Moving or speaking so slowly that other people could have noticed.  Or the opposite - being so fidgety or restless that you have been moving around a lot more than usual Not at all  Thoughts that you would be better off dead, or hurting yourself in some way Several days  PHQ2-9 Total Score 6  If you checked off any problems,  how difficult have these problems made it for you to do your work, take care of things at home, or get along with other people Somewhat difficult  Depression Interventions/Treatment       There were no vitals filed for this visit.  Medications Reviewed Today     Reviewed by Ermalinda Lenn HERO, LCSW (Social Worker) on 07/11/24 at 1655  Med List Status: <None>   Medication Order Taking? Sig Documenting Provider Last Dose Status Informant  aspirin  EC 81 MG tablet [id number]  Take 1 tablet (81 mg total) by mouth daily. Swallow whole. Lenetta No D, NP  Active   atorvastatin  (LIPITOR ) 80 MG tablet [id number]   Take 1 tablet (80 mg total) by mouth daily. Bair, Luke, MD  Active   carvedilol  (COREG ) 12.5 MG tablet [id number]  Take 1 tablet (12.5 mg total) by mouth 2 (two) times daily with a meal. Please call [phone] to schedule an appointment for future refills. Thank you. Cindie Ole DASEN, MD  Active   Deutetrabenazine  (AUSTEDO  XR PATIENT TITRATION) 6 & 12 & 24 MG TEPK [id number]  Take 1 tablet by mouth daily. Saucier, Dorn Ruth, NP  Active   donepezil (ARICEPT) 10 MG tablet [id number]  Take 10 mg by mouth at bedtime. [provider]  Expired 06/25/24 2359   dorzolamide -timolol  (COSOPT ) 22.3-6.8 MG/ML ophthalmic solution [id number]  Place 1 drop into both eyes 2 (two) times daily.  [provider]  Active Self, Pharmacy Records           Med Note JOYICE ILEANA FALCON   Dju Mar 17, 2016 10:15 PM)    fluticasone  (FLONASE ) 50 MCG/ACT nasal spray [id number]  Place 2 sprays into both nostrils daily. Bair, Luke, MD  Active   fluvoxaMINE  (LUVOX ) 100 MG tablet [id number]  Take 2 tablets (200 mg total) by mouth at bedtime. Saucier, Dorn Ruth, NP  Active   furosemide  (LASIX ) 40 MG tablet [id number]  Take 40 mg by mouth as needed. [provider]  Active   gabapentin  (NEURONTIN ) 400 MG capsule [id number]  Take 2 capsules (800 mg total) by mouth 3 (three) times daily. Saucier, Dorn Ruth, NP  Active   hydrOXYzine  (ATARAX ) 10 MG tablet [id number]  Take 1 tablet (10 mg total) by mouth 3 (three) times daily as needed. Saucier, Dorn Ruth, NP  Active   isosorbide  mononitrate (IMDUR ) 30 MG 24 hr tablet [id number]  Take 1 tablet by mouth once daily Wonda Sharper, MD  Active   QUEtiapine  (SEROQUEL ) 200 MG tablet [id number]  Take 1 tablet (200 mg total) by mouth at bedtime. Saucier, Dorn Ruth, NP  Active   valsartan  (DIOVAN ) 80 MG tablet [id number]  Take 1 tablet by mouth twice daily Wonda Sharper, MD  Active             Recommendation:   PCP Follow-up [facility] 07/14/24  Follow Up Plan:   Patient has met all care management goals. Care Management case will be closed. Patient has been provided contact information should new needs arise.   Sinclair Arrazola, LCSW [place]  [facility], [facility] Licensed Clinical Social Worker  Direct Dial: [phone]

## 2024-07-13 NOTE — Progress Notes (Signed)
Remote ICD Transmission.

## 2024-07-14 ENCOUNTER — Encounter: Payer: Self-pay | Admitting: Psychiatry

## 2024-07-14 ENCOUNTER — Ambulatory Visit: Admitting: Psychiatry

## 2024-07-14 VITALS — BP 136/86 | HR 79 | Temp 98.2°F | Ht 62.0 in | Wt 148.4 lb

## 2024-07-14 DIAGNOSIS — F422 Mixed obsessional thoughts and acts: Secondary | ICD-10-CM

## 2024-07-14 DIAGNOSIS — G2401 Drug induced subacute dyskinesia: Secondary | ICD-10-CM

## 2024-07-14 DIAGNOSIS — F4321 Adjustment disorder with depressed mood: Secondary | ICD-10-CM

## 2024-07-14 DIAGNOSIS — F411 Generalized anxiety disorder: Secondary | ICD-10-CM | POA: Diagnosis not present

## 2024-07-14 MED ORDER — QUETIAPINE FUMARATE 200 MG PO TABS: 200.0000 mg | ORAL_TABLET | Freq: Every day | ORAL | 1 refills | Status: DC

## 2024-07-14 MED ORDER — GABAPENTIN 400 MG PO CAPS: 800.0000 mg | ORAL_CAPSULE | Freq: Three times a day (TID) | ORAL | 2 refills | Status: DC

## 2024-07-14 MED ORDER — AUSTEDO XR 30 MG PO TB24: 1.0000 | ORAL_TABLET | Freq: Every day | ORAL | 2 refills | Status: DC

## 2024-07-14 MED ORDER — FLUVOXAMINE MALEATE 100 MG PO TABS: 200.0000 mg | ORAL_TABLET | Freq: Every day | ORAL | 2 refills | Status: DC

## 2024-07-14 NOTE — Progress Notes (Signed)
 BH MD/PA/NP OP Progress Note  07/14/2024 2:34 PM Melanie Ray  MRN:  [id number]  Chief Complaint:  Chief Complaint  Patient presents with   Follow-up   HPI: 73 year old female presenting ARPA for follow-up.  Patient reports that she has a phone filled season stating she is looking forward to all the [employer] football games as well as visiting family and going to concerts.  Patient reports that with these times where she is close to family she is not having any OCD tendencies depression or anxiety.  Patient reports that the anxiety and OCD tendencies typically comes out whenever she is having loneliness or isolated in which she states she does get suicidal thoughts when that happens.  In a previous conversation patient was recognized that these tendencies only, when she is isolated and was asked about whether or not she should consider moving closer to family in which she was open to.  Patient reports that she has financial stressors that are preventing her from doing so and patient has been encouraged to speak with family and explore potential opportunities to move closer to family.  Patient verbalized understanding.  Patient with no changes to regimen of medications.  Patient is in agreement with treatment plan.  Patient to continue medications as prescribed.  See plan.  Patient to follow-up in 3 months.  Patient currently denies SI, HI, AVH Visit Diagnosis:    ICD-10-CM   1. Generalized anxiety disorder  F41.1     2. Mixed obsessional thoughts and acts  F42.2 fluvoxaMINE  (LUVOX ) 100 MG tablet    3. Tardive dyskinesia  G24.01       Past Psychiatric History:  Previous Psych Hospitalizations: Denies   Outpatient treatment: Currently seeing neurology for concerns of Alzheimer's disease.   Medications Current: - Aricept 10 mg once daily from neurology - Gabapentin  800 mg 3 times a day for anxiety, patient self increase the medication from 400 mg 3 times a day due to anxiety and reports no  side effects and would prefer to continue - Fluvox 200mg  once daily. -Seroquel  200mg  once daily at bedtime - Austedo  30mg  once daily for TD   Medication Trials: -Fluoxetine , poor response   Suicide & Violence: -Denies SI, HI, AVH    Psychotherapy: -Participating in monthly therapy with [facility]. Provided Therapy referral list and placed on wait list for Almarie at [facility]   Legal:  -No legal issues  Past Medical History:  Past Medical History:  Diagnosis Date   ABLA (acute blood loss anemia) 10/24/2022   AICD (automatic cardioverter/defibrillator) present    Allergic rhinitis 09/25/2022   Back pain    Benign neoplasm of colon 04/27/2008   CAD (coronary artery disease) 04/11/2016   S/p ant STEMI 5/17: LHC >> LAD proximal 80%, mid 80%, distal 50%, ostial D1 60%; LCx with LPDA lesion 30%; RCA Mild calcification with no significant stenosis in a medium caliber, nondominant RCA; LVEF is estimated at 45% with inferoapical and lateral wall akinesis >> PCI: PCI: 3.5 x 24 mm Promus DES to prox LAD, 2.5 x 12 mm Promus DES to mid LAD.   Cerebellar stroke 03/30/2020   2021 MRI - Small, old left cerebellar infarct    Chest pain    Chronic HFrEF (heart failure with reduced ejection fraction) 10/25/2022   Chronic systolic CHF (congestive heart failure) 03/21/2016   Echo 01/30/17: Diff HK, mild focal basal septal hypertrophy, EF 30-35, mild AI, MAC, mild MR // Echo 06/08/16: Mild focal basal septal hypertrophy,  EF 25-30%, diff HK, ant-septal AK, Gr 1 DD, mild AI, MAC, mild MR, PASP 37 mmHg // Echo 03/18/16: EF 30-35%, ant-septal AK, Gr 1 DD, mild MR, severe LAE.     Constipation    Diarrhea 01/02/2023   Dizziness    Dyspnea    Endotracheal tube present 10/24/2022   Essential hypertension 04/27/2008   Well-controlled today.  She will continue valsartan  80 mg twice a day, Imdur  30 mg daily, Lasix  20 mg every other day, [name]  10 mg daily, and carvedilol  12.5 mg twice daily.   Finger  fracture 08/15/2021   She reports this is well-healing.  She will continue to see orthopedics.   Generalized anxiety disorder 04/27/2008   Glaucoma    Gout    History of acute anterior wall MI 03/17/2016   History of colonoscopy 04/27/2008   History of COVID-19 06/27/2022   HLD (hyperlipidemia) 04/27/2008   Check lipid panel.  She will continue Lipitor .   Hot flash, menopausal 02/07/2018   Will check with our clinical pharmacist regarding the black cohosh and primrose in this patient.   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   Iron deficiency anemia, unspecified 04/27/2008   Ischemic cardiomyopathy 10/02/2016   Joint pain    Left bundle branch block 12/03/2018   Chronic.  She will follow with cardiology.   Major depressive disorder 07/28/2015   Chronic ongoing issue.  Has worsened somewhat recently.  Notes passive SI.  Advised if she develops intent or plan to harm herself she needs to go to the emergency room.  I advised her to contact her psychiatrist to arrange for follow-up and she was willing to do so.  She does have protective factors in place including her husband, dog, and nephew.   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  She will follow with cardiology.  We will send her EKG to her psychiatrist and have CMA contact the psychiatry office to inform them that the EKG was faxed and of the results.   Rash 02/07/2018   Possibly related to folliculitis or some other undetermined cause of her rash.  She will trial over-the-counter antibiotic ointment and if not beneficial she will let us  know and  we can refer her to dermatology.   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 Wonda and Dr. 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  She will continue [name]  10 mg daily.   Uterine leiomyoma 04/27/2008   Vitamin D  deficiency 09/15/2010    Past Surgical History:  Procedure Laterality Date   APPENDECTOMY     BIV ICD INSERTION CRT-D N/A 01/02/2022   Procedure: BIV ICD INSERTION CRT-D;  Surgeon: Cindie Ole DASEN, MD;  Location: [facility] INVASIVE CV LAB;  Service: Cardiovascular;  Laterality: N/A;   CARDIAC CATHETERIZATION N/A 03/17/2016   Procedure: Left Heart Cath and Coronary Angiography;  Surgeon:  Ozell Fell, MD;  Location: [facility] INVASIVE CV LAB;  Service: Cardiovascular;  Laterality: N/A;   CARDIAC CATHETERIZATION N/A 03/17/2016   Procedure: Coronary Stent Intervention;  Surgeon: Ozell Fell, MD;  Location: [facility] INVASIVE CV LAB;  Service: Cardiovascular;  Laterality: N/A;   COLONOSCOPY WITH PROPOFOL  N/A 08/13/2017   Procedure: COLONOSCOPY WITH PROPOFOL ;  Surgeon: Therisa Bi, MD;  Location: [facility] ENDOSCOPY;  Service: Gastroenterology;  Laterality: N/A;   COLONOSCOPY WITH PROPOFOL  N/A 03/29/2020   Procedure: COLONOSCOPY WITH PROPOFOL ;  Surgeon: Jinny Carmine, MD;  Location: [facility] ENDOSCOPY;  Service: Endoscopy;  Laterality: N/A;   EXPLORATION POST OPERATIVE OPEN HEART N/A 10/24/2022   Procedure: EXPLORATION POST OPERATIVE OPEN HEART;  Surgeon: Maryjane Mt, MD;  Location: [facility] OR;  Service: Open Heart Surgery;  Laterality: N/A;   MITRAL VALVE REPLACEMENT N/A 10/24/2022   Procedure: MITRAL VALVE (MV) REPLACEMENT USING A 29 MM MEDTRONIC MOSAIC 310;  Surgeon: Maryjane Mt, MD;  Location: [facility] OR;  Service: Open Heart  Surgery;  Laterality: N/A;   RIGHT HEART CATH N/A 10/15/2022   Procedure: RIGHT HEART CATH;  Surgeon: Fell Ozell, MD;  Location: [facility] INVASIVE CV LAB;  Service: Cardiovascular;  Laterality: N/A;   RIGHT/LEFT HEART CATH AND CORONARY ANGIOGRAPHY N/A 08/02/2022   Procedure: RIGHT/LEFT HEART CATH AND CORONARY ANGIOGRAPHY;  Surgeon: Cherrie Toribio SAUNDERS, MD;  Location: [facility] INVASIVE CV LAB;  Service: Cardiovascular;  Laterality: N/A;   SMALL BOWEL REPAIR     TEE WITHOUT CARDIOVERSION N/A 08/02/2022   Procedure: TRANSESOPHAGEAL ECHOCARDIOGRAM (TEE);  Surgeon: Cherrie Toribio SAUNDERS, MD;  Location: [facility] ENDOSCOPY;  Service: Cardiovascular;  Laterality: N/A;   TEE WITHOUT CARDIOVERSION N/A 10/04/2022   Procedure: TRANSESOPHAGEAL ECHOCARDIOGRAM (TEE);  Surgeon: Fell Ozell, MD;  Location: [facility] INVASIVE CV LAB;  Service: Cardiovascular;  Laterality: N/A;   TEE WITHOUT CARDIOVERSION N/A 10/24/2022   Procedure: TRANSESOPHAGEAL ECHOCARDIOGRAM (TEE);  Surgeon: Maryjane Mt, MD;  Location: [facility] OR;  Service: Open Heart Surgery;  Laterality: N/A;   TONSILLECTOMY     TRANSCATHETER MITRAL EDGE TO EDGE REPAIR N/A 10/04/2022   Procedure: MITRAL VALVE REPAIR;  Surgeon: Fell Ozell, MD;  Location: [facility] INVASIVE CV LAB;  Service: Cardiovascular;  Laterality: N/A;   UTERINE FIBROID SURGERY      Family Psychiatric History: No additional  Family History:  Family History  Problem Relation Age of Onset   Anxiety disorder Mother    Paranoid behavior Mother    Hypertension Mother    Dementia Mother    High Cholesterol Mother    Diabetes Mother    Hyperlipidemia Mother    Depression Mother    Hypertension Father    High Cholesterol Father    Mood Disorder Sister    Stroke Sister    Anxiety disorder Maternal Aunt    Tuberculosis Paternal Grandfather    Drug abuse Cousin     Social History:  Social History   Socioeconomic History   Marital status: Widowed    Spouse name: Cindie Rajagopalan   Number of  children: 0   Years of education: 16   Highest education level: Bachelor's degree (e.g., BA, AB, BS)  Occupational History   Occupation: Retired    Comment: Runner, broadcasting/film/video  Tobacco Use   Smoking status: Former    Current packs/day: 0.00    Average packs/day: 0.5 packs/day for 25.0 years (12.5 ttl pk-yrs)    Types: Cigarettes    Start date: 04/13/1972    Quit date: 04/13/1997    Years since quitting: 27.2   Smokeless tobacco:  Never  Vaping Use   Vaping status: Never Used  Substance and Sexual Activity   Alcohol use: Not Currently    Comment: Occasional use   Drug use: No   Sexual activity: Not Currently  Other Topics Concern   Not on file  Social History Narrative   Lives in [place] with spouse.  No children.   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   Left-handed   Lives in a two story home       Social Drivers of Health   Financial Resource Strain: Low Risk  (10/18/2023)   Received from [facility]   Overall Financial Resource Strain (CARDIA)    Difficulty of Paying Living Expenses: Not hard at all  Recent Concern: Financial Resource Strain - Medium Risk (08/08/2023)   Overall Financial Resource Strain (CARDIA)    Difficulty of Paying Living Expenses: Somewhat hard  Food Insecurity: No Food Insecurity (06/25/2024)   Hunger Vital Sign    Worried About Running Out of Food in the Last Year: Never true    Ran Out of Food in the Last Year: Never true  Transportation Needs: No Transportation Needs (06/25/2024)   PRAPARE - Administrator, Civil Service (Medical): No    Lack of Transportation (Non-Medical): No  Physical Activity: Inactive (08/08/2023)   Exercise Vital Sign    Days of Exercise per Week: 0 days    Minutes of Exercise per Session: 0 min  Stress: Stress Concern Present (08/08/2023)   [employer] of Occupational Health - Occupational Stress Questionnaire    Feeling  of Stress : Very much  Social Connections: Moderately Integrated (08/08/2023)   Social Connection and Isolation Panel    Frequency of Communication with Friends and Family: More than three times a week    Frequency of Social Gatherings with Friends and Family: Never    Attends Religious Services: More than 4 times per year    Active Member of [employer] or Organizations: No    Attends Banker Meetings: Never    Marital Status: Married    Allergies:  Allergies  Allergen Reactions   Tetracycline Swelling   Meperidine Nausea And Vomiting    (Demerol)Nausea   Entresto  [Sacubitril -Valsartan ] Other (See Comments)    Shortness of Breath    Metabolic Disorder Labs: Lab Results  Component Value Date   HGBA1C 5.9 04/23/2024   MPG 111.15 05/07/2022   MPG 139.85 03/31/2020   No results found for: PROLACTIN Lab Results  Component Value Date   CHOL 140 04/23/2024   TRIG 55.0 04/23/2024   HDL 45.90 04/23/2024   CHOLHDL 3 04/23/2024   VLDL 11.0 04/23/2024   LDLCALC 83 04/23/2024   LDLCALC 75 03/20/2023   Lab Results  Component Value Date   TSH 0.79 12/28/2022   TSH 0.64 09/05/2021    Therapeutic Level Labs: No results found for: LITHIUM No results found for: VALPROATE No results found for: CBMZ  Current Medications: Current Outpatient Medications  Medication Sig Dispense Refill   aspirin  EC 81 MG tablet Take 1 tablet (81 mg total) by mouth daily. Swallow whole. 30 tablet 12   atorvastatin  (LIPITOR ) 80 MG tablet Take 1 tablet (80 mg total) by mouth daily. 90 tablet 3   carvedilol  (COREG ) 12.5 MG tablet Take 1 tablet (12.5 mg total) by mouth 2 (two) times daily with a meal. Please call [phone] to schedule an  appointment for future refills. Thank you. 60 tablet 1   Deutetrabenazine  (AUSTEDO  XR PATIENT TITRATION) 6 & 12 & 24 MG TEPK Take 1 tablet by mouth daily.     donepezil (ARICEPT) 10 MG tablet Take 10 mg by mouth at bedtime.     dorzolamide -timolol   (COSOPT ) 22.3-6.8 MG/ML ophthalmic solution Place 1 drop into both eyes 2 (two) times daily.      fluticasone  (FLONASE ) 50 MCG/ACT nasal spray Place 2 sprays into both nostrils daily. 16 g 6   fluvoxaMINE  (LUVOX ) 100 MG tablet Take 2 tablets (200 mg total) by mouth at bedtime. 30 tablet 0   furosemide  (LASIX ) 40 MG tablet Take 40 mg by mouth as needed.     gabapentin  (NEURONTIN ) 400 MG capsule Take 2 capsules (800 mg total) by mouth 3 (three) times daily. 180 capsule 2   hydrOXYzine  (ATARAX ) 10 MG tablet Take 1 tablet (10 mg total) by mouth 3 (three) times daily as needed. 90 tablet 0   isosorbide  mononitrate (IMDUR ) 30 MG 24 hr tablet Take 1 tablet by mouth once daily 90 tablet 3   QUEtiapine  (SEROQUEL ) 200 MG tablet Take 1 tablet (200 mg total) by mouth at bedtime. 90 tablet 1   valsartan  (DIOVAN ) 80 MG tablet Take 1 tablet by mouth twice daily 180 tablet 0   No current facility-administered medications for this visit.     Musculoskeletal: Strength & Muscle Tone: within normal limits Gait & Station: normal Patient leans: N/A   Psychiatric Specialty Exam: Review of Systems  Constitutional: Negative.   HENT: Negative.    Eyes: Negative.   Respiratory: Negative.    Cardiovascular: Negative.   Gastrointestinal: Negative.   Endocrine: Negative.   Genitourinary: Negative.   Musculoskeletal: Negative.   Skin: Negative.   Allergic/Immunologic: Negative.   Neurological: Negative.   Hematological: Negative.   Psychiatric/Behavioral:  Positive for dysphoric mood. The patient is nervous/anxious.     Blood pressure 136/86, pulse 79, temperature 98.2 F (36.8 C), temperature source Temporal, height 5' 2 (1.575 m), weight 148 lb 6.4 oz (67.3 kg), SpO2 99%.Body mass index is 27.14 kg/m.  General Appearance: Well Groomed  Eye Contact:  Good  Speech:  Clear and Coherent  Volume:  Normal  Mood:  Anxious and Depressed  Affect:  Appropriate  Thought Process:  Coherent  Orientation:  Full  (Time, Place, and Person)  Thought Content: Logical   Suicidal Thoughts:  No  Homicidal Thoughts:  No  Memory:  Immediate;   Good Recent;   Good Remote;   Good  Judgement:  Good  Insight:  Good  Psychomotor Activity:  Normal  Concentration:  Concentration: Good and Attention Span: Good  Recall:  Good  Fund of Knowledge: Good  Language: Good  Akathisia:  No  Handed:  Right  AIMS (if indicated): not done  Assets:  Desire for Improvement Financial Resources/Insurance Housing  ADL's:  Intact  Cognition: WNL  Sleep:  Good   Screenings: GAD-7    Flowsheet Row Patient Outreach Telephone from 06/25/2024 in [place] [facility] Office Visit from 06/23/2024 in [facility] Office Visit from 06/12/2024 in [facility] Visit from 06/09/2024 in [facility] Office Visit from 05/13/2024 in [facility]  Total GAD-7 Score 16 19 16 19 21    PHQ2-9    Flowsheet Row Patient Outreach Telephone from 07/10/2024 in [place] POPULATION HEALTH DEPARTMENT Patient Outreach  Telephone from 06/25/2024 in [facility] POPULATION HEALTH DEPARTMENT Office Visit from 06/23/2024 in [facility] Office Visit from 06/12/2024 in [facility] Visit from 06/09/2024 in [facility]  PHQ-2 Total Score 2 6 6 6 6   PHQ-9 Total Score 6 14 17 14 19    Flowsheet Row Office Visit from 06/23/2024 in [facility] Office Visit from 06/09/2024 in [facility] Office Visit from 05/13/2024 in [facility]  C-SSRS RISK CATEGORY Moderate Risk High Risk Low Risk     Assessment and Plan:  Assessment - Diagnosis: Mixed  obsessional thoughts and acts [F42.2]  2. Generalized anxiety disorder [F41.1]  3. Tardive dyskinesia [G24.01]    - Progress: Patient reports that she had a good trip to [facility] over the [facility] Day holiday and noticed that while she was on the trip she did not have any OCD symptoms.  Patient believes that she has OCD symptoms whenever she is by herself and isolated in her home now that she is without her husband. - Risk Factors: Worsening symptoms, suicide risk  Plan - Medications:  Continue to take Gabapentin  at 800mg  TID for anxiety. Pt reports she increased the dose on her own, and is stating no complications of sedation. Increase 200mg  Fluvox, once a day at bedtime for OCD.  Continue Quetiapine  200mg   once a day at night, prescribed by another provider patient was reminded of sedation nature of the medication to take her time and waking up as well as monitoring sedation in the mornings and to notify the clinician should it become an issue. Continue Austedo  30mg  once daily, for Tardive Dyskinesia.    - Psychotherapy: Pt with [facility] for therapy.  Patient is also being recommended to be placed on wait list for [facility] who specializes with OCD here at the [facility] office. - Education: Patient has been educated on the evidence-based recommendations for therapy and pharmacotherapy with OCD symptoms.  As patient is identifying severe symptoms of rituals with OCD patient is recommended to start on medications while waiting for a therapy referral.  Patient has been educated on medications with medication purpose, side effects and adverse reactions.  Patient has also been educated on the need for community support and community involvement during isolation as the patient has a recent widow of 2 months ago.  Patient has been encouraged to seek social groups within her faith-based or community base groups.  Patient has also been educated on importance of following up weekly due to severe symptoms  to monitor medication effectiveness as well as possible adjustments. - Follow-Up: Patient to follow-up once Neurology is done with med management.  - Referrals: Patient has been referred to outside therapy as well as being placed on the wait list for Almarie here at [facility] office. - Safety Planning: The patient has been educated, if they should have suicidal thoughts with or without a plan to call 911, or go to the closest emergency department.  Pt verbalized understanding.  Pt denies firearms within the home.  Pt also agrees to call the clinic should they have worsening symptoms before the next appointment.   Patient/Guardian was advised Release of Information must be obtained prior to any record release in order to collaborate their care with an outside provider. Patient/Guardian was advised if they have not already done so to contact the registration department to sign all  necessary forms in order for us  to release information regarding their care.   Consent: Patient/Guardian gives verbal consent for treatment and assignment of benefits for services provided during this visit. Patient/Guardian expressed understanding and agreed to proceed.    Dorn Jama Der, NP 07/14/2024, 2:34 PM

## 2024-07-24 ENCOUNTER — Telehealth: Payer: Self-pay

## 2024-07-24 NOTE — Telephone Encounter (Signed)
 received fax that a prior auth is needed for the deutetrabenazine  ER 30mg 

## 2024-07-24 NOTE — Telephone Encounter (Signed)
 received notice that the austedo  was denied. pt needs to have tried three of the covered medications, ingrezza, tetrabenazine and austedo 

## 2024-07-24 NOTE — Telephone Encounter (Signed)
 went online to [email or web address] and submitted the prior auth . - pending

## 2024-07-27 ENCOUNTER — Telehealth: Payer: Self-pay | Admitting: Psychiatry

## 2024-07-27 ENCOUNTER — Ambulatory Visit

## 2024-07-27 ENCOUNTER — Ambulatory Visit: Payer: Self-pay

## 2024-07-27 VITALS — BP 130/70 | HR 67 | Temp 98.6°F | Ht 62.0 in | Wt 154.0 lb

## 2024-07-27 DIAGNOSIS — D649 Anemia, unspecified: Secondary | ICD-10-CM

## 2024-07-27 DIAGNOSIS — F39 Unspecified mood [affective] disorder: Secondary | ICD-10-CM | POA: Diagnosis not present

## 2024-07-27 DIAGNOSIS — G4733 Obstructive sleep apnea (adult) (pediatric): Secondary | ICD-10-CM

## 2024-07-27 DIAGNOSIS — M85852 Other specified disorders of bone density and structure, left thigh: Secondary | ICD-10-CM

## 2024-07-27 DIAGNOSIS — E119 Type 2 diabetes mellitus without complications: Secondary | ICD-10-CM | POA: Diagnosis not present

## 2024-07-27 DIAGNOSIS — R5383 Other fatigue: Secondary | ICD-10-CM | POA: Diagnosis not present

## 2024-07-27 DIAGNOSIS — J301 Allergic rhinitis due to pollen: Secondary | ICD-10-CM | POA: Diagnosis not present

## 2024-07-27 DIAGNOSIS — N1831 Chronic kidney disease, stage 3a: Secondary | ICD-10-CM

## 2024-07-27 DIAGNOSIS — E559 Vitamin D deficiency, unspecified: Secondary | ICD-10-CM

## 2024-07-27 LAB — MICROALBUMIN / CREATININE URINE RATIO
Creatinine,U: 88.1 mg/dL
Microalb Creat Ratio: 10.8 mg/g (ref 0.0–30.0)
Microalb, Ur: 1 mg/dL (ref 0.0–1.9)

## 2024-07-27 LAB — TSH: TSH: 0.51 u[IU]/mL (ref 0.35–5.50)

## 2024-07-27 LAB — CBC WITH DIFFERENTIAL/PLATELET
Basophils Absolute: 0 K/uL (ref 0.0–0.1)
Basophils Relative: 0.9 % (ref 0.0–3.0)
Eosinophils Absolute: 0 K/uL (ref 0.0–0.7)
Eosinophils Relative: 1.1 % (ref 0.0–5.0)
HCT: 34.2 % — ABNORMAL LOW (ref 36.0–46.0)
Hemoglobin: 11.1 g/dL — ABNORMAL LOW (ref 12.0–15.0)
Lymphocytes Relative: 35.7 % (ref 12.0–46.0)
Lymphs Abs: 1.4 K/uL (ref 0.7–4.0)
MCHC: 32.6 g/dL (ref 30.0–36.0)
MCV: 82.9 fl (ref 78.0–100.0)
Monocytes Absolute: 0.5 K/uL (ref 0.1–1.0)
Monocytes Relative: 12.9 % — ABNORMAL HIGH (ref 3.0–12.0)
Neutro Abs: 1.9 K/uL (ref 1.4–7.7)
Neutrophils Relative %: 49.4 % (ref 43.0–77.0)
Platelets: 183 K/uL (ref 150.0–400.0)
RBC: 4.12 Mil/uL (ref 3.87–5.11)
RDW: 15.9 % — ABNORMAL HIGH (ref 11.5–15.5)
WBC: 3.8 K/uL — ABNORMAL LOW (ref 4.0–10.5)

## 2024-07-27 LAB — COMPREHENSIVE METABOLIC PANEL WITH GFR
ALT: 12 U/L (ref 0–35)
AST: 17 U/L (ref 0–37)
Albumin: 4.2 g/dL (ref 3.5–5.2)
Alkaline Phosphatase: 114 U/L (ref 39–117)
BUN: 16 mg/dL (ref 6–23)
CO2: 28 meq/L (ref 19–32)
Calcium: 9.5 mg/dL (ref 8.4–10.5)
Chloride: 106 meq/L (ref 96–112)
Creatinine, Ser: 1.1 mg/dL (ref 0.40–1.20)
GFR: 49.59 mL/min — ABNORMAL LOW (ref >60.00)
Glucose, Bld: 59 mg/dL — ABNORMAL LOW (ref 70–99)
Potassium: 4.3 meq/L (ref 3.5–5.1)
Sodium: 139 meq/L (ref 135–145)
Total Bilirubin: 0.4 mg/dL (ref 0.2–1.2)
Total Protein: 6.6 g/dL (ref 6.0–8.3)

## 2024-07-27 LAB — VITAMIN D 25 HYDROXY (VIT D DEFICIENCY, FRACTURES): VITD: 18.62 ng/mL — ABNORMAL LOW (ref 30.00–100.00)

## 2024-07-27 LAB — HEMOGLOBIN A1C: Hgb A1c MFr Bld: 6.2 % (ref 4.6–6.5)

## 2024-07-27 MED ORDER — VITAMIN D (ERGOCALCIFEROL) 1.25 MG (50000 UNIT) PO CAPS
50000.0000 [IU] | ORAL_CAPSULE | ORAL | 3 refills | Status: AC
Start: 2024-07-27 — End: ?

## 2024-07-27 NOTE — Assessment & Plan Note (Signed)
 Previously noted on CBC. Repeat CBC.

## 2024-07-27 NOTE — Assessment & Plan Note (Signed)
 No plan on SI/HI but does have intermittent SI ideation. Noted on PHQ 9 today, when discussed on safety patient reports she had had intermittent SI and does not have a plan. She reports this is chronic and not new. Continue close follow up with behavioral health department ( has appointment on  04/27/24).  If active suicidal thoughts: Please call 911. You can also Call 988, text [phone] or chat online with a crisis counselor.  Mirtazapine  contraindicated due to cardiac history. Recommend melatonin 3 mg one hour before bedtime. Encourage physical activity to improve mood and sleep.

## 2024-07-27 NOTE — Assessment & Plan Note (Addendum)
 Chronic and reports she has felt somewhat better compared to her last OV here in August. Likely multifactorial (untreated OSA/patient preference, mood disorder, complex cardiac history, medication side effect.  Periodically have issues with generalized weakness and fatigue.  No obvious cause found on exam.  We will check CBC, CMP, TSH, vitamin D  to look into reversible causes for fatigue.

## 2024-07-27 NOTE — Assessment & Plan Note (Signed)
 Persistent symptoms despite Flonase . Reviewed proper technique. Avoid new allergy medications due to cardiac history.

## 2024-07-27 NOTE — Assessment & Plan Note (Signed)
 Osteopenia diagnosed, last DEXA in 2020.  - Order DEXA scan. - Recommend vitamin D  1000 units daily. - Recommend calcium  600 mg twice daily.

## 2024-07-27 NOTE — Patient Instructions (Addendum)
 If active suicidal thoughts: Please call 911. You can also Call 988, text 988 or chat online with a crisis counselor.    Please cal neurologist Dr. Lane to schedule a follow up appointment.   Please reach out to [facility] to schedule for an eye appointment.   Try Melatonin 3 mg an hour before bedtime. Unfortunately, due to your heart history we cannot prescribe you Mirtazapine .   You can update your COVID-19 and flu shot at your local pharmacy. Ask them to update tetanus booster as well.   You are due for a bone scan, please call the following number to schedule bone scan:  YOUR BONE DENISTY SCAN (dexa)  IS DUE, PLEASE CALL AND GET THIS SCHEDULED! [facility] - call [phone]  Take daily vitamin D  1000 units and calcium  600 mg twice a day to help protect bone health.   To keep diabetes at bay and to improve overall health:  Diet: Emphasize whole grains, lean proteins, fruits, and vegetables. Limit processed foods and sugary drinks. Exercise: Aim for 150 minutes of moderate aerobic activity weekly plus strength training twice a week.  Follow up in 4 months with Dr. Abbey, or sooner if you need me.

## 2024-07-27 NOTE — Assessment & Plan Note (Signed)
 History of OSA. Not using CPAP due to discomfort. Declined sleep specialist referral despite understanding risks.

## 2024-07-27 NOTE — Progress Notes (Signed)
 Established Patient Office Visit   Subjective  Patient ID: Melanie Ray, female    DOB: [birth year]  Age: 73 y.o. MRN: [id number]  No chief complaint on file.   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), Chest pain, Chronic HFrEF (heart failure with reduced ejection fraction) (10/25/2022), Chronic systolic CHF (congestive heart failure) (03/21/2016), Constipation, Diarrhea (01/02/2023), Dizziness, Dyspnea, Endotracheal tube present (10/24/2022), Essential hypertension (04/27/2008), Finger fracture (08/15/2021), Generalized anxiety disorder (04/27/2008), Glaucoma, Gout, History of acute anterior wall MI (03/17/2016), History of colonoscopy (04/27/2008), History of COVID-19 (06/27/2022), HLD (hyperlipidemia) (04/27/2008), Hot flash, menopausal (02/07/2018), Hypercholesterolemia (04/27/2008), Hyperglycemia (10/26/2022), Hyperlipemia, ICD (implantable cardioverter-defibrillator) in place (01/02/2022), Insomnia (07/28/2015), Iron deficiency anemia, unspecified (04/27/2008), Ischemic cardiomyopathy (10/02/2016), Joint pain, Left bundle branch block (12/03/2018), Major depressive disorder (07/28/2015), Nausea, Nightmares (03/01/2020), Obsessive compulsive disorder (08/27/2018), Obstructive sleep apnea, Osteopenia (04/27/2008), Positive colorectal cancer screening using Cologuard test (03/01/2020), Postprocedural hypotension (10/24/2022), QT prolongation (12/03/2018), Rash (02/07/2018), 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  HPI Last OV with me on 04/23/24.   1) Mood disorder, mild cognitive impairment with OCD,  anxiety, h/o suicide attempt, schizoaffective disorder, depressive type: - Established with psychiatry and neurology  - Saw psychiatry/Jonathan Saucier, NP on 07/14/24 and neurology Dr. Potter/neurology, last visit with him was on 05/12/24. Is due for a f/u with her neurologists.  - Participating in monthly therapy with [facility]  - Current treatment:  Aricept 10 mg once daily from neurology Gabapentin  to 400 mg in the morning, 400 mg in the afternoon and 800 mg at night per neurology.   Depakote to 250 mg twice daily for depression, anxiety and compulsive behavior.  Seroquel  200 mg at night for sleep and mood. Refill. Continue to follow with mental health provider as directed. Previously taking Mirtazapine , requesting refill on this medication. She has not been taking this for more than a month. Reports this helped with sleep.   2) She was seen in clinic on 06/12/24 for ongoing dizziness. She was recommended to go to ED but she declined.  Patient reports since her visit here in clinic in August her symptoms has improved. Today she declines chest pain, palpitations, dizziness, shortness of breath, lower leg edema.   3) HLD: On Atorvastatin  80 mg daily, also takes Aspirin  81 mg daily.   4) OSA: Not compliant with CPAP. Does not like mask and is not interested in seeing sleep specialist.   5) HTN, h/o chronic systolic heart failure, paroxymal atrial fibrillation, left bundle branch block has ICD in place. Patient denies chest pain, palpitations. Currently on Coreg  12.5 mg BID, Lasix  20 mg prn for edema of lower legs, Imdur  30 mg daily,  valsartan  80 mg BID. Last cardiology visit was on 08/14/2023.    ROS As per HPI    Objective:     BP 130/70 (BP Location: Right Arm, Cuff Size: Normal)   Pulse 67   Temp 98.6 F (37 C) (Oral)   Ht 5' 2 (1.575 m)   Wt  154 lb (69.9 kg)   SpO2 98%   BMI 28.17 kg/m      07/27/2024    1:12 PM 07/14/2024  3:05 PM 07/10/2024   11:02 AM  Depression screen PHQ 2/9  Decreased Interest 1 1 1   Down, Depressed, Hopeless 2 2 1   PHQ - 2 Score 3 3 2   Altered sleeping 1 0 0  Tired, decreased energy 3 2 1   Change in appetite 0 0 0  Feeling bad or failure about yourself  2 1 1   Trouble concentrating 1 1 1   Moving slowly or fidgety/restless 1 1 0  Suicidal thoughts 2 1 1   PHQ-9 Score 13 9 6   Difficult doing work/chores Very difficult Somewhat difficult Somewhat difficult      07/27/2024    1:12 PM 07/14/2024    3:05 PM 06/25/2024    3:52 PM 06/23/2024    1:37 PM  GAD 7 : Generalized Anxiety Score  Nervous, Anxious, on Edge 2 2 3 3   Control/stop worrying 2 2 3 3   Worry too much - different things 2 2 3 3   Trouble relaxing 2 1 1 3   Restless 2 2 2 3   Easily annoyed or irritable 2 1 1 1   Afraid - awful might happen 3 2 3 3   Total GAD 7 Score 15 12 16 19   Anxiety Difficulty Very difficult Somewhat difficult Very difficult Very difficult      07/27/2024    1:12 PM 07/14/2024    3:05 PM 07/10/2024   11:02 AM  Depression screen PHQ 2/9  Decreased Interest 1 1 1   Down, Depressed, Hopeless 2 2 1   PHQ - 2 Score 3 3 2   Altered sleeping 1 0 0  Tired, decreased energy 3 2 1   Change in appetite 0 0 0  Feeling bad or failure about yourself  2 1 1   Trouble concentrating 1 1 1   Moving slowly or fidgety/restless 1 1 0  Suicidal thoughts 2 1 1   PHQ-9 Score 13 9 6   Difficult doing work/chores Very difficult Somewhat difficult Somewhat difficult      07/27/2024    1:12 PM 07/14/2024    3:05 PM 06/25/2024    3:52 PM 06/23/2024    1:37 PM  GAD 7 : Generalized Anxiety Score  Nervous, Anxious, on Edge 2 2 3 3   Control/stop worrying 2 2 3 3   Worry too much - different things 2 2 3 3   Trouble relaxing 2 1 1 3   Restless 2 2 2 3   Easily annoyed or irritable 2 1 1 1   Afraid - awful might happen 3 2 3 3   Total GAD 7 Score 15  12 16 19   Anxiety Difficulty Very difficult Somewhat difficult Very difficult Very difficult   SDOH Screenings   Food Insecurity: No Food Insecurity (06/25/2024)  Housing: Unknown (06/25/2024)  Transportation Needs: No Transportation Needs (06/25/2024)  Utilities: Not At Risk (06/25/2024)  Alcohol Screen: Low Risk  (08/08/2023)  Depression (PHQ2-9): High Risk (07/27/2024)  Financial Resource Strain: Low Risk  (10/18/2023)   Received from [facility]  Recent Concern: Financial Resource Strain - Medium Risk (08/08/2023)  Physical Activity: Inactive (08/08/2023)  Social Connections: Moderately Integrated (08/08/2023)  Stress: Stress Concern Present (08/08/2023)  Tobacco Use: Medium Risk (07/27/2024)  Health Literacy: Adequate Health Literacy (08/08/2023)     Physical Exam HENT:     Head: Normocephalic and atraumatic.     Mouth/Throat:     Mouth: Mucous membranes are moist.     Pharynx: Oropharynx is clear.  Cardiovascular:     Rate and Rhythm: Normal rate.  Pulmonary:     Effort: Pulmonary  effort is normal.     Breath sounds: Normal breath sounds.  Abdominal:     General: Bowel sounds are normal.     Palpations: Abdomen is soft.     Tenderness: There is no guarding.  Musculoskeletal:     Cervical back: Normal range of motion and neck supple.     Right lower leg: No edema.     Left lower leg: No edema.  Lymphadenopathy:     Cervical: No cervical adenopathy.  Skin:    General: Skin is warm.  Neurological:     Mental Status: She is alert and oriented to person, place, and time.  Psychiatric:        Mood and Affect: Mood normal.        Results for orders placed or performed in visit on 07/27/24  HgB A1c  Result Value Ref Range   Hgb A1c MFr Bld 6.2 4.6 - 6.5 %  Comp Met (CMET)  Result Value Ref Range   Sodium 139 135 - 145 mEq/L   Potassium 4.3 3.5 - 5.1 mEq/L   Chloride 106 96 - 112 mEq/L   CO2 28 19 - 32 mEq/L   Glucose, Bld 59 (L) 70 - 99 mg/dL    BUN 16 6 - 23 mg/dL   Creatinine, Ser 8.89 0.40 - 1.20 mg/dL   Total Bilirubin 0.4 0.2 - 1.2 mg/dL   Alkaline Phosphatase 114 39 - 117 U/L   AST 17 0 - 37 U/L   ALT 12 0 - 35 U/L   Total Protein 6.6 6.0 - 8.3 g/dL   Albumin  4.2 3.5 - 5.2 g/dL   GFR 50.40 (L) >39.99 mL/min   Calcium  9.5 8.4 - 10.5 mg/dL  Vitamin D  (25 hydroxy)  Result Value Ref Range   VITD 18.62 (L) 30.00 - 100.00 ng/mL  CBC w/Diff  Result Value Ref Range   WBC 3.8 (L) 4.0 - 10.5 K/uL   RBC 4.12 3.87 - 5.11 Mil/uL   Hemoglobin 11.1 (L) 12.0 - 15.0 g/dL   HCT 65.7 (L) 63.9 - 53.9 %   MCV 82.9 78.0 - 100.0 fl   MCHC 32.6 30.0 - 36.0 g/dL   RDW 84.0 (H) 88.4 - 84.4 %   Platelets 183.0 150.0 - 400.0 K/uL   Neutrophils Relative % 49.4 43.0 - 77.0 %   Lymphocytes Relative 35.7 12.0 - 46.0 %   Monocytes Relative 12.9 (H) 3.0 - 12.0 %   Eosinophils Relative 1.1 0.0 - 5.0 %   Basophils Relative 0.9 0.0 - 3.0 %   Neutro Abs 1.9 1.4 - 7.7 K/uL   Lymphs Abs 1.4 0.7 - 4.0 K/uL   Monocytes Absolute 0.5 0.1 - 1.0 K/uL   Eosinophils Absolute 0.0 0.0 - 0.7 K/uL   Basophils Absolute 0.0 0.0 - 0.1 K/uL  TSH  Result Value Ref Range   TSH 0.51 0.35 - 5.50 uIU/mL  Urine Microalbumin w/creat. ratio  Result Value Ref Range   Microalb, Ur 1.0 0.0 - 1.9 mg/dL   Creatinine,U 11.8 mg/dL   Microalb Creat Ratio 10.8 0.0 - 30.0 mg/g    The ASCVD Risk score (Arnett DK, et al., 2019) failed to calculate for the following reasons:   Risk score cannot be calculated because patient has a medical history suggesting prior/existing ASCVD     Assessment & Plan:   Type 2 diabetes mellitus without complication, without long-term current use of insulin  (HCC) Assessment & Plan: Diet controlled type 2 diabetes mellitus. Repeat A1c, urine  microalbumin level.  Emphasized lifestyle modifications for blood sugar management. Encourage 150 minutes of exercise per week.  Emphasize diet with lean protein, whole grains, and limit processed foods.  Recommend continue annual diabetic eye exam.    Orders: -     Hemoglobin A1c -     Microalbumin / creatinine urine ratio  Normocytic normochromic anemia Assessment & Plan: Previously noted on CBC. Repeat CBC.    Orders: -     CBC with Differential/Platelet  Other fatigue Assessment & Plan: Chronic and reports she has felt somewhat better compared to her last OV here in August. Likely multifactorial (untreated OSA/patient preference, mood disorder, complex cardiac history, medication side effect.  Periodically have issues with generalized weakness and fatigue.  No obvious cause found on exam.  We will check CBC, CMP, TSH, vitamin D  to look into reversible causes for fatigue.   Orders: -     Comprehensive metabolic panel with GFR -     VITAMIN D  25 Hydroxy (Vit-D Deficiency, Fractures) -     CBC with Differential/Platelet -     TSH  Osteopenia of neck of left femur Assessment & Plan: Osteopenia diagnosed, last DEXA in 2020.  - Order DEXA scan. - Recommend vitamin D  1000 units daily. - Recommend calcium  600 mg twice daily.  Orders: -     DG Bone Density; Future  Non-seasonal allergic rhinitis due to pollen Assessment & Plan: Persistent symptoms despite Flonase . Reviewed proper technique. Avoid new allergy medications due to cardiac history.    Mood disorder Assessment & Plan: No plan on SI/HI but does have intermittent SI ideation. Noted on PHQ 9 today, when discussed on safety patient reports she had had intermittent SI and does not have a plan. She reports this is chronic and not new. Continue close follow up with behavioral health department ( has appointment on  04/27/24).  If active suicidal thoughts: Please call 911. You can also Call 988, text 988 or chat online with a crisis counselor.  Mirtazapine  contraindicated due to cardiac history. Recommend melatonin 3 mg one hour before bedtime. Encourage physical activity to improve mood and sleep.     Obstructive sleep  apnea Assessment & Plan: History of OSA. Not using CPAP due to discomfort. Declined sleep specialist referral despite understanding risks.     Return in about 4 months (around 11/26/2024).   Luke Shade, MD

## 2024-07-27 NOTE — Progress Notes (Signed)
 Please call the patient (Last mychart login 06/20/24) to let her know:   - Her A1c is stable and in prediabetic range. Her blood glucose is low. This can contribute to feeling tired, palpitations, sweaty. Recommend: Eat regular meals and snacks every 3-4 hours, include a bedtime snack if needed to avoid low sugar episodes. Prioritize whole grains, legumes, nonstarchy vegetables, and whole fruits.  - GFR shows slightly reduced kidney function. If she is interested to see a nephrologist for further evaluation I will put in a referral.   - Her vitamin D  is low, I recommend once weekly vitamin D  supplement to improve vitamin D  level. Prescription sent to the pharmacy.   - She continues to have mildly low hemoglobin. I recommend repeat hemoglobin, check for iron level in 2 weeks. She can come in for lab only appointment. I have ordered future lab.   - Thyroid  hormone, urine result looks normal.  Thank you,  Luke Shade, MD   ---------- 1. Normocytic normochromic anemia (Primary) - CBC w/Diff; Future - Iron, TIBC and Ferritin Panel; Future  2. Other fatigue - CBC w/Diff; Future - Iron, TIBC and Ferritin Panel; Future  3. Vitamin D  deficiency - Vitamin D , Ergocalciferol , (DRISDOL ) 1.25 MG (50000 UNIT) CAPS capsule; Take 1 capsule (50,000 Units total) by mouth every 7 (seven) days.  Dispense: 12 capsule; Refill: 3

## 2024-07-27 NOTE — Assessment & Plan Note (Addendum)
 Diet controlled type 2 diabetes mellitus. Repeat A1c, urine microalbumin level.  Emphasized lifestyle modifications for blood sugar management. Encourage 150 minutes of exercise per week.  Emphasize diet with lean protein, whole grains, and limit processed foods. Recommend continue annual diabetic eye exam.

## 2024-07-28 DIAGNOSIS — N1831 Chronic kidney disease, stage 3a: Secondary | ICD-10-CM | POA: Insufficient documentation

## 2024-07-28 NOTE — Telephone Encounter (Signed)
-   Stage 3a chronic kidney disease [facility]) Ambulatory referral to Nephrology  Luke Shade, MD

## 2024-07-31 ENCOUNTER — Ambulatory Visit (INDEPENDENT_AMBULATORY_CARE_PROVIDER_SITE_OTHER): Payer: Self-pay | Admitting: Plastic Surgery

## 2024-07-31 ENCOUNTER — Encounter: Payer: Self-pay | Admitting: Plastic Surgery

## 2024-07-31 VITALS — BP 116/68 | HR 71 | Ht 62.0 in | Wt 148.2 lb

## 2024-07-31 DIAGNOSIS — H02839 Dermatochalasis of unspecified eye, unspecified eyelid: Secondary | ICD-10-CM | POA: Insufficient documentation

## 2024-07-31 DIAGNOSIS — H02831 Dermatochalasis of right upper eyelid: Secondary | ICD-10-CM

## 2024-07-31 DIAGNOSIS — E119 Type 2 diabetes mellitus without complications: Secondary | ICD-10-CM

## 2024-07-31 DIAGNOSIS — H02832 Dermatochalasis of right lower eyelid: Secondary | ICD-10-CM

## 2024-07-31 DIAGNOSIS — H02834 Dermatochalasis of left upper eyelid: Secondary | ICD-10-CM

## 2024-07-31 DIAGNOSIS — H02835 Dermatochalasis of left lower eyelid: Secondary | ICD-10-CM

## 2024-07-31 DIAGNOSIS — Z95811 Presence of heart assist device: Secondary | ICD-10-CM

## 2024-07-31 DIAGNOSIS — F422 Mixed obsessional thoughts and acts: Secondary | ICD-10-CM

## 2024-07-31 NOTE — Progress Notes (Signed)
 Patient ID: Melanie Ray, female    DOB: 1951/07/01, 73 y.o.   MRN: [id number]   Chief Complaint  Patient presents with   consult    The patient is a 73 year old female here for evaluation of her lower eyelids.  She has multiple medical conditions including mitral valve disease, heart device, memory complaints, depressive disorder, kidney disease and osteoporosis.  She has also had a stroke.  Her past surgical history is listed below.  She does not like the way her lower lids look.  She is concerned that they have puffiness to them and wants to see if that could be improved upon.  It looks like she has ptosis and dermatochalasis of her upper eyelids.  We looked at the pictures together and talked about it but she says she does not want to do anything about the upper lids.    Review of Systems  Constitutional: Negative.   Eyes: Negative.   Respiratory: Negative.    Cardiovascular: Negative.   Gastrointestinal: Negative.   Endocrine: Negative.   Genitourinary: Negative.     Past Medical History:  Diagnosis Date   ABLA (acute blood loss anemia) 10/24/2022   AICD (automatic cardioverter/defibrillator) present    Allergic rhinitis 09/25/2022   Back pain    Benign neoplasm of colon 04/27/2008   CAD (coronary artery disease) 04/11/2016   S/p ant STEMI 5/17: LHC >> LAD proximal 80%, mid 80%, distal 50%, ostial D1 60%; LCx with LPDA lesion 30%; RCA Mild calcification with no significant stenosis in a medium caliber, nondominant RCA; LVEF is estimated at 45% with inferoapical and lateral wall akinesis >> PCI: PCI: 3.5 x 24 mm Promus DES to prox LAD, 2.5 x 12 mm Promus DES to mid LAD.   Cerebellar stroke 03/30/2020   2021 MRI - Small, old left cerebellar infarct    Chest pain    Chronic HFrEF (heart failure with reduced ejection fraction) 10/25/2022   Chronic systolic CHF (congestive heart failure) 03/21/2016   Echo 01/30/17: Diff HK, mild focal basal septal hypertrophy, EF  30-35, mild AI, MAC, mild MR // Echo 06/08/16: Mild focal basal septal hypertrophy, EF 25-30%, diff HK, ant-septal AK, Gr 1 DD, mild AI, MAC, mild MR, PASP 37 mmHg // Echo 03/18/16: EF 30-35%, ant-septal AK, Gr 1 DD, mild MR, severe LAE.     Constipation    Diarrhea 01/02/2023   Dizziness    Dyspnea    Endotracheal tube present 10/24/2022   Essential hypertension 04/27/2008   Well-controlled today.  She will continue valsartan  80 mg twice a day, Imdur  30 mg daily, Lasix  20 mg every other day, [name]  10 mg daily, and carvedilol  12.5 mg twice daily.   Finger fracture 08/15/2021   She reports this is well-healing.  She will continue to see orthopedics.   Generalized anxiety disorder 04/27/2008   Glaucoma    Gout    History of acute anterior wall MI 03/17/2016   History of colonoscopy 04/27/2008   History of COVID-19 06/27/2022   HLD (hyperlipidemia) 04/27/2008   Check lipid panel.  She will continue Lipitor .   Hot flash, menopausal 02/07/2018   Will check with our clinical pharmacist regarding the black cohosh and primrose in this patient.   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   Iron deficiency anemia, unspecified 04/27/2008   Ischemic cardiomyopathy 10/02/2016  Joint pain    Left bundle branch block 12/03/2018   Chronic.  She will follow with cardiology.   Major depressive disorder 07/28/2015   Chronic ongoing issue.  Has worsened somewhat recently.  Notes passive SI.  Advised if she develops intent or plan to harm herself she needs to go to the emergency room.  I advised her to contact her psychiatrist to arrange for follow-up and she was willing to do so.  She does have protective factors in place including her husband, dog, and nephew.   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  She will follow with cardiology.  We will send her EKG to her psychiatrist and have CMA contact the psychiatry office to inform them that the EKG was faxed and of the results.   Rash 02/07/2018   Possibly related to folliculitis or some other undetermined cause of her rash.  She will trial over-the-counter antibiotic ointment and if not beneficial she will let us  know and we can refer her to dermatology.   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 Wonda and Dr. 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  She will continue [name]  10 mg daily.   Uterine leiomyoma 04/27/2008   Vitamin D  deficiency 09/15/2010    Past Surgical History:  Procedure Laterality Date   APPENDECTOMY     BIV ICD INSERTION CRT-D N/A 01/02/2022   Procedure: BIV ICD INSERTION CRT-D;  Surgeon: Cindie Ole DASEN, MD;  Location[facility] INVASIVE CV LAB;  Service: Cardiovascular;  Laterality: N/A;   CARDIAC CATHETERIZATION N/A 03/17/2016   Procedure: Left Heart Cath and Coronary Angiography;  Surgeon: Ozell Wonda, MD;  Location: [facility] INVASIVE CV LAB;  Service: Cardiovascular;  Laterality: N/A;   CARDIAC CATHETERIZATION N/A 03/17/2016   Procedure: Coronary Stent Intervention;  Surgeon: Ozell Wonda, MD;  Location: [facility] INVASIVE CV LAB;  Service: Cardiovascular;  Laterality: N/A;   COLONOSCOPY WITH PROPOFOL  N/A 08/13/2017   Procedure: COLONOSCOPY WITH PROPOFOL ;  Surgeon: Therisa Bi, MD;  Location: [facility] ENDOSCOPY;  Service: Gastroenterology;  Laterality: N/A;    COLONOSCOPY WITH PROPOFOL  N/A 03/29/2020   Procedure: COLONOSCOPY WITH PROPOFOL ;  Surgeon: Jinny Carmine, MD;  Location: [facility] ENDOSCOPY;  Service: Endoscopy;  Laterality: N/A;   EXPLORATION POST OPERATIVE OPEN HEART N/A 10/24/2022   Procedure: EXPLORATION POST OPERATIVE OPEN HEART;  Surgeon: Maryjane Mt, MD;  Location: [facility] OR;  Service: Open Heart Surgery;  Laterality: N/A;   MITRAL VALVE REPLACEMENT N/A 10/24/2022   Procedure: MITRAL VALVE (MV) REPLACEMENT USING A 29 MM MEDTRONIC MOSAIC 310;  Surgeon: Maryjane Mt, MD;  Location: [facility] OR;  Service: Open Heart Surgery;  Laterality: N/A;   RIGHT HEART CATH N/A 10/15/2022   Procedure: RIGHT HEART CATH;  Surgeon: Wonda Ozell, MD;  Location: [facility] INVASIVE CV LAB;  Service: Cardiovascular;  Laterality: N/A;   RIGHT/LEFT HEART CATH AND CORONARY ANGIOGRAPHY N/A 08/02/2022   Procedure: RIGHT/LEFT HEART CATH AND CORONARY ANGIOGRAPHY;  Surgeon: Cherrie Toribio SAUNDERS, MD;  Location: [facility]  INVASIVE CV LAB;  Service: Cardiovascular;  Laterality: N/A;   SMALL BOWEL REPAIR     TEE WITHOUT CARDIOVERSION N/A 08/02/2022   Procedure: TRANSESOPHAGEAL ECHOCARDIOGRAM (TEE);  Surgeon: Cherrie Toribio SAUNDERS, MD;  Location: [facility] ENDOSCOPY;  Service: Cardiovascular;  Laterality: N/A;   TEE WITHOUT CARDIOVERSION N/A 10/04/2022   Procedure: TRANSESOPHAGEAL ECHOCARDIOGRAM (TEE);  Surgeon: Wonda Sharper, MD;  Location: [facility] INVASIVE CV LAB;  Service: Cardiovascular;  Laterality: N/A;   TEE WITHOUT CARDIOVERSION N/A 10/24/2022   Procedure: TRANSESOPHAGEAL ECHOCARDIOGRAM (TEE);  Surgeon: Maryjane Mt, MD;  Location: [facility] OR;  Service: Open Heart Surgery;  Laterality: N/A;   TONSILLECTOMY     TRANSCATHETER MITRAL EDGE TO EDGE REPAIR N/A 10/04/2022   Procedure: MITRAL VALVE REPAIR;  Surgeon: Wonda Sharper, MD;  Location: [facility] INVASIVE CV LAB;  Service: Cardiovascular;  Laterality: N/A;   UTERINE FIBROID SURGERY        Current Outpatient Medications:    aspirin  EC 81 MG tablet, Take 1  tablet (81 mg total) by mouth daily. Swallow whole., Disp: 30 tablet, Rfl: 12   atorvastatin  (LIPITOR ) 80 MG tablet, Take 1 tablet (80 mg total) by mouth daily., Disp: 90 tablet, Rfl: 3   carvedilol  (COREG ) 12.5 MG tablet, Take 1 tablet (12.5 mg total) by mouth 2 (two) times daily with a meal. Please call [phone] to schedule an appointment for future refills. Thank you., Disp: 60 tablet, Rfl: 1   Deutetrabenazine  ER (AUSTEDO  XR) 30 MG TB24, Take 1 tablet by mouth daily., Disp: 30 tablet, Rfl: 2   donepezil (ARICEPT) 10 MG tablet, Take 10 mg by mouth at bedtime., Disp: , Rfl:    dorzolamide -timolol  (COSOPT ) 22.3-6.8 MG/ML ophthalmic solution, Place 1 drop into both eyes 2 (two) times daily. , Disp: , Rfl:    fluticasone  (FLONASE ) 50 MCG/ACT nasal spray, Place 2 sprays into both nostrils daily., Disp: 16 g, Rfl: 6   fluvoxaMINE  (LUVOX ) 100 MG tablet, Take 2 tablets (200 mg total) by mouth at bedtime., Disp: 60 tablet, Rfl: 2   furosemide  (LASIX ) 40 MG tablet, Take 40 mg by mouth as needed., Disp: , Rfl:    gabapentin  (NEURONTIN ) 400 MG capsule, Take 2 capsules (800 mg total) by mouth 3 (three) times daily., Disp: 180 capsule, Rfl: 2   hydrOXYzine  (ATARAX ) 10 MG tablet, Take 1 tablet (10 mg total) by mouth 3 (three) times daily as needed., Disp: 90 tablet, Rfl: 0   isosorbide  mononitrate (IMDUR ) 30 MG 24 hr tablet, Take 1 tablet by mouth once daily, Disp: 90 tablet, Rfl: 3   QUEtiapine  (SEROQUEL ) 200 MG tablet, Take 1 tablet (200 mg total) by mouth at bedtime., Disp: 90 tablet, Rfl: 1   valsartan  (DIOVAN ) 80 MG tablet, Take 1 tablet by mouth twice daily, Disp: 180 tablet, Rfl: 0   Vitamin D , Ergocalciferol , (DRISDOL ) 1.25 MG (50000 UNIT) CAPS capsule, Take 1 capsule (50,000 Units total) by mouth every 7 (seven) days., Disp: 12 capsule, Rfl: 3   Objective:   Vitals:   07/31/24 0954  BP: 116/68  Pulse: 71  SpO2: 95%    Physical Exam Cardiovascular:     Rate and Rhythm: Normal rate.      Pulses: Normal pulses.  Pulmonary:     Effort: Pulmonary effort is normal.  Abdominal:     Palpations: Abdomen is soft.  Skin:    General: Skin is warm.     Capillary Refill: Capillary refill takes less than 2 seconds.  Neurological:     Mental Status: She  is alert and oriented to person, place, and time.  Psychiatric:        Behavior: Behavior normal.     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  I think that surgery of the lower lids is possible but we need to outweigh the risks.  Pictures were obtained of the patient and placed in the chart with the patient's or guardian's permission.  Melanie RAMAN Vallorie Niccoli, DO

## 2024-08-04 ENCOUNTER — Telehealth: Payer: Self-pay | Admitting: Psychiatry

## 2024-08-05 DIAGNOSIS — I129 Hypertensive chronic kidney disease with stage 1 through stage 4 chronic kidney disease, or unspecified chronic kidney disease: Secondary | ICD-10-CM | POA: Diagnosis not present

## 2024-08-05 DIAGNOSIS — R8290 Unspecified abnormal findings in urine: Secondary | ICD-10-CM | POA: Diagnosis not present

## 2024-08-05 DIAGNOSIS — D631 Anemia in chronic kidney disease: Secondary | ICD-10-CM | POA: Diagnosis not present

## 2024-08-05 DIAGNOSIS — N1831 Chronic kidney disease, stage 3a: Secondary | ICD-10-CM | POA: Diagnosis not present

## 2024-08-12 ENCOUNTER — Other Ambulatory Visit: Payer: Self-pay | Admitting: Psychiatry

## 2024-08-12 MED ORDER — AUSTEDO 12 MG PO TABS: 2.0000 | ORAL_TABLET | Freq: Every day | ORAL | 2 refills | Status: DC

## 2024-08-18 ENCOUNTER — Telehealth: Payer: Self-pay

## 2024-08-18 NOTE — Telephone Encounter (Signed)
   Pre-operative Risk Assessment    Patient Name: Melanie Ray  DOB: 1950-11-07 MRN: [id number]   Date of last office visit: 10/16/23 Date of next office visit:    Request for Surgical Clearance    Procedure:  Regular cleaning   Date of Surgery:  Clearance 10/26/24                                 Surgeon:   Surgeon's Group or Practice Name:  [facility]  Phone number:  [phone] Fax number:  [phone]   Type of Clearance Requested:   - Medical  - Pharmacy:  Hold Aspirin  Not indicated    Type of Anesthesia:  Not Indicated   Additional requests/questions:  I called the office to ask why they are requesting clearance and if patient getting any additional treatment she stated patient said she had a stroke and has a pacemaker therefore they want to make sure patient is cleared to have her cleaning   Signed, Rebeca Blight   08/18/2024, 3:56 PM

## 2024-08-19 NOTE — Telephone Encounter (Signed)
   Patient Name: Melanie Ray  DOB: July 20, 1951 MRN: [id number]  Primary Cardiologist: Ozell Fell, MD  Chart reviewed as part of pre-operative protocol coverage.    Simple dental extractions (i.e. 1-2 teeth) are considered low risk procedures per guidelines and generally do not require any specific cardiac clearance. It is also generally accepted that for simple extractions and dental cleanings, there is no need to interrupt blood thinner therapy.   SBE prophylaxis is not required for the patient from a cardiac standpoint.  I will route this recommendation to the requesting party via Epic fax function and remove from pre-op pool.  Please call with questions.  Lamarr Satterfield, NP 08/19/2024, 7:57 AM

## 2024-08-25 ENCOUNTER — Telehealth (HOSPITAL_COMMUNITY): Payer: Self-pay

## 2024-08-25 ENCOUNTER — Ambulatory Visit

## 2024-08-25 NOTE — Telephone Encounter (Signed)
  error

## 2024-08-29 ENCOUNTER — Other Ambulatory Visit: Payer: Self-pay | Admitting: Cardiology

## 2024-09-09 ENCOUNTER — Other Ambulatory Visit (HOSPITAL_COMMUNITY): Payer: Self-pay

## 2024-09-22 ENCOUNTER — Ambulatory Visit: Admitting: Cardiology

## 2024-09-23 ENCOUNTER — Ambulatory Visit: Attending: Cardiology | Admitting: Cardiology

## 2024-09-23 ENCOUNTER — Ambulatory Visit: Payer: Self-pay | Admitting: Cardiology

## 2024-09-23 VITALS — BP 144/82 | HR 70 | Ht 62.0 in | Wt 148.6 lb

## 2024-09-23 DIAGNOSIS — I5022 Chronic systolic (congestive) heart failure: Secondary | ICD-10-CM | POA: Diagnosis not present

## 2024-09-23 DIAGNOSIS — I447 Left bundle-branch block, unspecified: Secondary | ICD-10-CM

## 2024-09-23 DIAGNOSIS — Z95810 Presence of automatic (implantable) cardiac defibrillator: Secondary | ICD-10-CM

## 2024-09-23 DIAGNOSIS — I480 Paroxysmal atrial fibrillation: Secondary | ICD-10-CM

## 2024-09-23 LAB — CUP PACEART INCLINIC DEVICE CHECK
Battery Remaining Longevity: 49 mo
Brady Statistic RA Percent Paced: 93 %
Brady Statistic RV Percent Paced: 99 %
Date Time Interrogation Session: 20251119153756
HighPow Impedance: 54 Ohm
Implantable Lead Connection Status: 753985
Implantable Lead Connection Status: 753985
Implantable Lead Connection Status: 753985
Implantable Lead Implant Date: 20230228
Implantable Lead Implant Date: 20230228
Implantable Lead Implant Date: 20230228
Implantable Lead Location: 753858
Implantable Lead Location: 753859
Implantable Lead Location: 753860
Implantable Lead Model: 3830
Implantable Pulse Generator Implant Date: 20230228
Lead Channel Impedance Value: 412.5 Ohm
Lead Channel Impedance Value: 425 Ohm
Lead Channel Impedance Value: 550 Ohm
Lead Channel Pacing Threshold Amplitude: 0.75 V
Lead Channel Pacing Threshold Amplitude: 0.75 V
Lead Channel Pacing Threshold Amplitude: 0.75 V
Lead Channel Pacing Threshold Amplitude: 0.75 V
Lead Channel Pacing Threshold Amplitude: 2.875 V
Lead Channel Pacing Threshold Pulse Width: 0.05 ms
Lead Channel Pacing Threshold Pulse Width: 0.5 ms
Lead Channel Pacing Threshold Pulse Width: 0.5 ms
Lead Channel Pacing Threshold Pulse Width: 0.5 ms
Lead Channel Pacing Threshold Pulse Width: 0.5 ms
Lead Channel Sensing Intrinsic Amplitude: 12 mV
Lead Channel Sensing Intrinsic Amplitude: 2.4 mV
Lead Channel Setting Pacing Amplitude: 0.25 V
Lead Channel Setting Pacing Amplitude: 2 V
Lead Channel Setting Pacing Amplitude: 2 V
Lead Channel Setting Pacing Pulse Width: 0.05 ms
Lead Channel Setting Pacing Pulse Width: 0.5 ms
Lead Channel Setting Sensing Sensitivity: 0.5 mV
Pulse Gen Serial Number: 8904264
Zone Setting Status: 755011

## 2024-09-23 NOTE — Patient Instructions (Signed)
 Medication Instructions:  Your physician recommends that you continue on your current medications as directed. Please refer to the Current Medication list given to you today.   *If you need a refill on your cardiac medications before your next appointment, please call your pharmacy*  Lab Work: No labs ordered today  If you have labs (blood work) drawn today and your tests are completely normal, you will receive your results only by: MyChart Message (if you have MyChart) OR A paper copy in the mail If you have any lab test that is abnormal or we need to change your treatment, we will call you to review the results.  Testing/Procedures: No test ordered today   Follow-Up: At [facility], you and your health needs are our priority.  As part of our continuing mission to provide you with exceptional heart care, our providers are all part of one team.  This team includes your primary Cardiologist (physician) and Advanced Practice Providers or APPs (Physician Assistants and Nurse Practitioners) who all work together to provide you with the care you need, when you need it.  Your next appointment:   1 year(s)  Provider:   Chantal Needle, NP or Fonda Kitty, MD   We recommend signing up for the patient portal called MyChart.  Sign up information is provided on this After Visit Summary.  MyChart is used to connect with patients for Virtual Visits (Telemedicine).  Patients are able to view lab/test results, encounter notes, upcoming appointments, etc.  Non-urgent messages can be sent to your provider as well.   To learn more about what you can do with MyChart, go to [email or web address].

## 2024-09-23 NOTE — Progress Notes (Signed)
 Electrophysiology Clinic Note    Date:  09/23/2024  Patient ID:  Melanie Ray, Melanie Ray 1951/10/12, MRN [id number] PCP:  Abbey Bruckner, MD  Cardiologist:  Ozell Fell, MD  Cardiology APP:  Lelon Glendia DASEN, PA-C  Electrophysiologist:  OLE DASEN HOLTS, MD  Electrophysiology APP:  Malikah Lakey, NP     Discussed the use of AI scribe software for clinical note transcription with the patient, who gave verbal consent to proceed.   Patient Profile    Chief Complaint: CRT-D follow-up  History of Present Illness: Melanie Ray is a 73 y.o. female with PMH notable for HFrEF, LBBB s/p CRT-D, CAD s/p PCI, severe MR s/p mitraclip, T2DM, HTN, HLD, CVA, obsessive-compulsive disorder; seen today for OLE DASEN HOLTS, MD for routine electrophysiology followup.   She last saw Dr. Holts 08/2023 after device detected low AFib burden. H/o LAA ligation during previous cardiac surgery. Planned to assess LAA with CT scan which showed a closed appendage and appropriate placement of RV lead.   On follow-up today, she has no acute complaints.  She denies chest pain, chest pressure, palpitations, shortness of breath.  No sensation of her heart racing or beating fast.  She denies increased lower extremity edema.     Arrhythmia/Device History St[employer] Jude CRT-D, imp 2023; dx HFrEF, LBBB - MDT LV lead is LB-area lead - RV lead is sub-threshold for pacing    ROS:  Please see the history of present illness. All other systems are reviewed and otherwise negative.    Physical Exam    VS:  BP (!) 144/82 (BP Location: Left Arm, Patient Position: Sitting, Cuff Size: Normal)   Pulse 70   Ht 5' 2 (1.575 m)   Wt 148 lb 9.6 oz (67.4 kg)   SpO2 97%   BMI 27.18 kg/m  BMI: Body mass index is 27.18 kg/m.           Wt Readings from Last 3 Encounters:  09/23/24 148 lb 9.6 oz (67.4 kg)  07/31/24 148 lb 3.2 oz (67.2 kg)  07/27/24 154 lb (69.9 kg)     GEN- The patient is well  appearing, alert and oriented x 3 today.   Lungs- Clear to ausculation bilaterally, normal work of breathing.  Heart- Regular rate and rhythm, no murmurs, rubs or gallops Extremities- No peripheral edema, warm, dry Skin-  device pocket well-healed, no tethering   Device interrogation done today and reviewed by myself:  Battery 4+ years Lead thresholds, impedence, sensing stable  VP 99%  AFib suppression algorithm is on throughout interrogation No recent AF episodes  RV has known elevated pacing threshold, programmed subthreshold Rare, brief NSVT episodes, no therapies   No changes made today   Studies Reviewed   Previous EP, cardiology notes.    EKG is ordered. Personal review of EKG from today shows:    EKG Interpretation Date/Time:  Wednesday September 23 2024 14:31:15 EST Ventricular Rate:  70 PR Interval:  178 QRS Duration:  120 QT Interval:  460 QTC Calculation: 496 R Axis:   -45  Text Interpretation: AV dual-paced rhythm Confirmed by Nykerria Macconnell [phone]) on 09/23/2024 2:35:02 PM     Cardiac CT, 10/24/2023 1. There is normal pulmonary vein drainage into the left atrium with ostial measurements above.  2. Previous hx of Left atrial appendage ligation.  3. The esophagus runs in the left atrial midline and is not in proximity to any of the pulmonary vein ostia.  4. No PFO/ASD.  5.  Normal coronary origin. Right dominance.   6. CAC score of 1092 which is 96 percentile for age-, race-, and sex-matched controls.  TTE, 10/16/2023  1. Left ventricular ejection fraction, by estimation, is 25 to 30%. The left ventricle has severely decreased function. The left ventricle demonstrates global hypokinesis with septal-lateral dyssynchrony consistent with pacing. The left ventricular internal cavity size was mildly dilated. There is mild concentric left ventricular hypertrophy. Left ventricular diastolic parameters are indeterminate.   2. Right ventricular systolic function is  normal. The right ventricular size is normal. There is normal pulmonary artery systolic pressure. The estimated right ventricular systolic pressure is 35.9 mmHg.   3. Left atrial size was severely dilated.   4. There is a bioprosthetic mitral valve. Trivial mitral valve regurgitation. The mean mitral valve gradient is 4.0 mmHg.   5. The aortic valve is tricuspid. There is mild calcification of the aortic valve. Aortic valve regurgitation is moderate. No aortic stenosis is present.   6. The inferior vena cava is normal in size with greater than 50% respiratory variability, suggesting right atrial pressure of 3 mmHg.   TTE, 02/25/2023 1. Left ventricular ejection fraction, by estimation, is 30 to 35%. The left ventricle has moderate to severely decreased function. The left ventricle demonstrates global hypokinesis. There is mild left ventricular hypertrophy. Left ventricular diastolic parameters are indeterminate. The average left ventricular global longitudinal strain is -6.2 %. The global longitudinal strain is abnormal.  2. Right ventricular systolic function is normal. The right ventricular size is normal.  3. Left atrial size was mildly dilated.  4. The mitral valve has been repaired/replaced. No evidence of mitral valve regurgitation. The mean mitral valve gradient is 6.7 mmHg. There is a bioprosthetic valve present in the mitral position. Echo findings are  consistent with normal structure and function of the mitral valve prosthesis.  5. The aortic valve is grossly normal. Aortic valve regurgitation is mild to moderate. Aortic valve sclerosis/calcification is present, without any evidence of aortic stenosis. Aortic valve mean gradient measures 7.0 mmHg.  6. The inferior vena cava is normal in size with greater than 50% respiratory variability, suggesting right atrial pressure of 3 mmHg.   Comparison(s): 01/19/19 55-60%, MVP with mean gradient .    Assessment and Plan     #) ICM s/p  CRT-D Device functioning well, see paceart for details Known elevated RV pacing threshold, programmed sub-threshold High LV pacing with narrow QRS CorVue stable  #) parox AFib No recent episodes No indication for NAOC with over-sewn LAA Continue 81mg  ASA         Current medicines are reviewed at length with the patient today.   The patient does not have concerns regarding her medicines.  The following changes were made today:  none  Labs/ tests ordered today include:  Orders Placed This Encounter  Procedures   EKG 12-Lead     Disposition: Follow up with Dr. Kennyth or EP APP in 12 months   Signed, Kamirah Shugrue, NP  09/23/24  3:12 PM  Electrophysiology [facility]

## 2024-10-06 ENCOUNTER — Ambulatory Visit: Payer: Medicare Other

## 2024-10-06 DIAGNOSIS — I480 Paroxysmal atrial fibrillation: Secondary | ICD-10-CM

## 2024-10-07 LAB — CUP PACEART REMOTE DEVICE CHECK
Battery Remaining Longevity: 53 mo
Battery Remaining Percentage: 58 %
Battery Voltage: 2.96 V
Brady Statistic AP VP Percent: 94 %
Brady Statistic AP VS Percent: 1 %
Brady Statistic AS VP Percent: 5.2 %
Brady Statistic AS VS Percent: 1 %
Brady Statistic RA Percent Paced: 94 %
Date Time Interrogation Session: 20251202020016
HighPow Impedance: 50 Ohm
HighPow Impedance: 50 Ohm
Implantable Lead Connection Status: 753985
Implantable Lead Connection Status: 753985
Implantable Lead Connection Status: 753985
Implantable Lead Implant Date: 20230228
Implantable Lead Implant Date: 20230228
Implantable Lead Implant Date: 20230228
Implantable Lead Location: 753858
Implantable Lead Location: 753859
Implantable Lead Location: 753860
Implantable Lead Model: 3830
Implantable Pulse Generator Implant Date: 20230228
Lead Channel Impedance Value: 390 Ohm
Lead Channel Impedance Value: 410 Ohm
Lead Channel Impedance Value: 530 Ohm
Lead Channel Pacing Threshold Amplitude: 0.75 V
Lead Channel Pacing Threshold Amplitude: 0.875 V
Lead Channel Pacing Threshold Amplitude: 2.875 V
Lead Channel Pacing Threshold Pulse Width: 0.05 ms
Lead Channel Pacing Threshold Pulse Width: 0.5 ms
Lead Channel Pacing Threshold Pulse Width: 0.5 ms
Lead Channel Sensing Intrinsic Amplitude: 12 mV
Lead Channel Sensing Intrinsic Amplitude: 2.5 mV
Lead Channel Setting Pacing Amplitude: 0.25 V
Lead Channel Setting Pacing Amplitude: 2 V
Lead Channel Setting Pacing Amplitude: 2 V
Lead Channel Setting Pacing Pulse Width: 0.05 ms
Lead Channel Setting Pacing Pulse Width: 0.5 ms
Lead Channel Setting Sensing Sensitivity: 0.5 mV
Pulse Gen Serial Number: 8904264
Zone Setting Status: 755011

## 2024-10-08 ENCOUNTER — Telehealth: Payer: Self-pay | Admitting: Cardiology

## 2024-10-08 NOTE — Telephone Encounter (Signed)
*  STAT* If patient is at the pharmacy, call can be transferred to refill team.   1. Which medications need to be refilled? (please list name of each medication and dose if known) carvedilol  (COREG ) 12.5 MG tablet    2. Would you like to learn more about the convenience, safety, & potential cost savings by using the [facility]? no   3. Are you open to using the [facility] (Type [facility]. no  4. Which pharmacy/location (including street and city if local pharmacy) is medication to be sent to?  [id number] - [place], [place] - [address]    5. Do they need a 30 day or 90 day supply? 90  Pt has been out for 2 days now.

## 2024-10-09 ENCOUNTER — Other Ambulatory Visit (HOSPITAL_COMMUNITY): Payer: Self-pay

## 2024-10-09 MED ORDER — CARVEDILOL 12.5 MG PO TABS: 12.5000 mg | ORAL_TABLET | Freq: Two times a day (BID) | ORAL | 0 refills | Status: DC

## 2024-10-09 NOTE — Progress Notes (Signed)
 Remote ICD Transmission

## 2024-10-11 ENCOUNTER — Ambulatory Visit: Payer: Self-pay | Admitting: Cardiology

## 2024-10-12 ENCOUNTER — Other Ambulatory Visit: Payer: Self-pay

## 2024-10-12 MED ORDER — CARVEDILOL 12.5 MG PO TABS: 12.5000 mg | ORAL_TABLET | Freq: Two times a day (BID) | ORAL | 0 refills | Status: DC

## 2024-10-19 ENCOUNTER — Ambulatory Visit: Payer: Self-pay

## 2024-10-19 NOTE — Telephone Encounter (Signed)
 Copied from CRM [id number]. Topic: Clinical - Red Word Triage >> Oct 19, 2024  1:31 PM Harlene ORN wrote: Red Word that prompted transfer to Nurse Triage: breast pain and dizziness and fatigue

## 2024-10-19 NOTE — Telephone Encounter (Signed)
 FYI Only or Action Required?: FYI only for provider: appointment scheduled on 10/20/24.  Patient was last seen in primary care on 07/27/2024 by Bair, Kalpana, MD.  Called Nurse Triage reporting Breast Pain.  Symptoms began yesterday.  Interventions attempted: Rest, hydration, or home remedies.  Symptoms are: unchanged.  Triage Disposition: See PCP Within 2 Weeks  Patient/caregiver understands and will follow disposition?: Yes  Reason for Disposition  [1] Breast pain AND [2] cause is not known  Answer Assessment - Initial Assessment Questions 1. SYMPTOM: What's the main symptom you're concerned about?  (e.g., lump, nipple discharge, pain, rash)     Breast pain 2. LOCATION:     Both breasts 3. ONSET:      3 days 4. PRIOR HISTORY: Do you have any history of prior problems with your breasts? (e.g., breast cancer, breast implant, fibrocystic breast disease)     Denies 5. CAUSE: What do you think is causing this symptom?     Unknown 6. OTHER SYMPTOMS: Do you have any other symptoms? (e.g., breast pain, fever, nipple discharge, redness or rash)     Breast pain, denies other  Protocols used: Breast Symptoms-A-AH

## 2024-10-19 NOTE — Telephone Encounter (Signed)
 Call disconnected prior to warm transfer. Nurse will attempt to contact patient.

## 2024-10-19 NOTE — Telephone Encounter (Signed)
 This RN attempted to contact patient, no answer, left voicemail message. Will place in Call Back folder.

## 2024-10-20 ENCOUNTER — Encounter: Payer: Self-pay | Admitting: Psychiatry

## 2024-10-20 ENCOUNTER — Other Ambulatory Visit: Payer: Self-pay

## 2024-10-20 ENCOUNTER — Ambulatory Visit: Admitting: Psychiatry

## 2024-10-20 VITALS — BP 159/72 | HR 70 | Temp 97.6°F | Ht 62.0 in | Wt 151.4 lb

## 2024-10-20 DIAGNOSIS — F4321 Adjustment disorder with depressed mood: Secondary | ICD-10-CM

## 2024-10-20 DIAGNOSIS — F422 Mixed obsessional thoughts and acts: Secondary | ICD-10-CM

## 2024-10-20 DIAGNOSIS — F411 Generalized anxiety disorder: Secondary | ICD-10-CM

## 2024-10-20 DIAGNOSIS — G2401 Drug induced subacute dyskinesia: Secondary | ICD-10-CM

## 2024-10-20 MED ORDER — GABAPENTIN 400 MG PO CAPS: 800.0000 mg | ORAL_CAPSULE | Freq: Three times a day (TID) | ORAL | 2 refills | Status: AC

## 2024-10-20 MED ORDER — AUSTEDO 12 MG PO TABS: 2.0000 | ORAL_TABLET | Freq: Every day | ORAL | 2 refills | Status: DC

## 2024-10-20 MED ORDER — FLUVOXAMINE MALEATE 100 MG PO TABS: 200.0000 mg | ORAL_TABLET | Freq: Every day | ORAL | 2 refills | Status: AC

## 2024-10-20 MED ORDER — QUETIAPINE FUMARATE 200 MG PO TABS: 200.0000 mg | ORAL_TABLET | Freq: Every day | ORAL | 1 refills | Status: AC

## 2024-10-20 MED ORDER — HYDROXYZINE HCL 10 MG PO TABS: 10.0000 mg | ORAL_TABLET | Freq: Three times a day (TID) | ORAL | 0 refills | Status: AC | PRN

## 2024-10-20 NOTE — Progress Notes (Unsigned)
 BH MD/PA/NP OP Progress Note  10/20/2024 2:39 PM Melanie Ray  MRN:  [id number]  Chief Complaint:  Chief Complaint  Patient presents with   Follow-up   HPI: 73 year old female presenting ARPA for follow-up.  Patient reports that she has a lot of plans with family stating that she has been feeling great recently but states that she has had suicidal ideation within the last several weeks due to the realization that more money is going out and coming in.  Patient reports she spends a lot of money on her dog as well as her lifestyle stating that she is not used to being a widow and reports that she is having a significant amount of stress is causing a significant depression OCD tendencies and suicidal ideation.  Patient reports that she will do anything for her dog but states that is becoming overwhelming and states that she is grateful for her nephew and helping her bookkeeping and helping understand where her mother what he is going.  Patient reports that she has no way of trying to find more work as she cannot work but states that she is still attempting to try to find an opportunity to be able to go to family and live with family.  Patient has been explained that he she is seems to have more stress when she is isolated and away from family.  Patient agrees and verbalized complete understanding.  Based on this assessment interview patient will continue current medications, prescriptions have been provided.  Patient has also been educated on how to reach this provider as she would like to follow this provider to the next practice.  Patient reports that she is needing to need help with virtual visits in which she was walked through in the office with this provider.  As patient will be following with this provider to the next practice patient does not need to follow-up at this clinic.  Patient denies HI and AVH. Visit Diagnosis:    ICD-10-CM   1. Mixed obsessional thoughts and acts  F42.2     2.  Generalized anxiety disorder  F41.1     3. Tardive dyskinesia  G24.01       Past Psychiatric History:  Previous Psych Hospitalizations: Denies   Outpatient treatment: Currently seeing neurology for concerns of Alzheimer's disease.   Medications Current: - Aricept 10 mg once daily from neurology - Gabapentin  800 mg 3 times a day for anxiety, patient self increase the medication from 400 mg 3 times a day due to anxiety and reports no side effects and would prefer to continue - Fluvox 200mg  once daily. -Seroquel  200mg  once daily at bedtime - Austedo  30mg  once daily for TD   Medication Trials: -Fluoxetine , poor response   Suicide & Violence: -Denies SI, HI, AVH    Psychotherapy: -Participating in monthly therapy with [facility]. Provided Therapy referral list and placed on wait list for Almarie at [facility]   Legal:  -No legal issues  Past Medical History:  Past Medical History:  Diagnosis Date   ABLA (acute blood loss anemia) 10/24/2022   AICD (automatic cardioverter/defibrillator) present    Allergic rhinitis 09/25/2022   Back pain    Benign neoplasm of colon 04/27/2008   CAD (coronary artery disease) 04/11/2016   S/p ant STEMI 5/17: LHC >> LAD proximal 80%, mid 80%, distal 50%, ostial D1 60%; LCx with LPDA lesion 30%; RCA Mild calcification with no significant stenosis in a medium caliber, nondominant RCA; LVEF is estimated at  45% with inferoapical and lateral wall akinesis >> PCI: PCI: 3.5 x 24 mm Promus DES to prox LAD, 2.5 x 12 mm Promus DES to mid LAD.   Cerebellar stroke 03/30/2020   2021 MRI - Small, old left cerebellar infarct    Chest pain    Chronic HFrEF (heart failure with reduced ejection fraction) 10/25/2022   Chronic systolic CHF (congestive heart failure) 03/21/2016   Echo 01/30/17: Diff HK, mild focal basal septal hypertrophy, EF 30-35, mild AI, MAC, mild MR // Echo 06/08/16: Mild focal basal septal hypertrophy, EF 25-30%, diff HK, ant-septal AK, Gr 1 DD,  mild AI, MAC, mild MR, PASP 37 mmHg // Echo 03/18/16: EF 30-35%, ant-septal AK, Gr 1 DD, mild MR, severe LAE.     Constipation    Diarrhea 01/02/2023   Dizziness    Dyspnea    Endotracheal tube present 10/24/2022   Essential hypertension 04/27/2008   Well-controlled today.  She will continue valsartan  80 mg twice a day, Imdur  30 mg daily, Lasix  20 mg every other day, [name]  10 mg daily, and carvedilol  12.5 mg twice daily.   Finger fracture 08/15/2021   She reports this is well-healing.  She will continue to see orthopedics.   Generalized anxiety disorder 04/27/2008   Glaucoma    Gout    History of acute anterior wall MI 03/17/2016   History of colonoscopy 04/27/2008   History of COVID-19 06/27/2022   HLD (hyperlipidemia) 04/27/2008   Check lipid panel.  She will continue Lipitor .   Hot flash, menopausal 02/07/2018   Will check with our clinical pharmacist regarding the black cohosh and primrose in this patient.   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   Iron deficiency anemia, unspecified 04/27/2008   Ischemic cardiomyopathy 10/02/2016   Joint pain    Left bundle branch block 12/03/2018   Chronic.  She will follow with cardiology.   Major depressive disorder 07/28/2015   Chronic ongoing issue.  Has worsened somewhat recently.  Notes passive SI.  Advised if she develops intent or plan to harm herself she needs to go to the emergency room.  I advised her to contact her psychiatrist to arrange for follow-up and she was willing to do so.  She does have protective factors in place including her husband, dog, and nephew.   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  She will follow with cardiology.  We will send her EKG to her psychiatrist and have CMA contact the psychiatry office to inform them that the EKG was faxed and of the results.   Rash 02/07/2018   Possibly related to folliculitis or some other undetermined cause of her rash.  She will trial over-the-counter antibiotic ointment and if not beneficial she will let us  know and we can refer her to dermatology.   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 Wonda and Dr. 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  She will continue [name]  10 mg daily.   Uterine leiomyoma 04/27/2008   Vitamin D  deficiency 09/15/2010    Past Surgical History:  Procedure Laterality Date   APPENDECTOMY     BIV ICD INSERTION CRT-D N/A 01/02/2022   Procedure: BIV ICD INSERTION CRT-D;  Surgeon: Cindie Ole DASEN, MD;  Location: [facility] INVASIVE CV LAB;  Service: Cardiovascular;  Laterality: N/A;   CARDIAC CATHETERIZATION N/A 03/17/2016   Procedure: Left Heart Cath and Coronary Angiography;  Surgeon: Ozell Fell, MD;  Location: [facility] INVASIVE CV LAB;  Service: Cardiovascular;  Laterality: N/A;   CARDIAC CATHETERIZATION N/A 03/17/2016   Procedure: Coronary Stent Intervention;  Surgeon: Ozell Fell, MD;  Location: [facility] INVASIVE CV LAB;  Service: Cardiovascular;  Laterality: N/A;   COLONOSCOPY WITH PROPOFOL  N/A 08/13/2017   Procedure: COLONOSCOPY WITH PROPOFOL ;  Surgeon: Therisa Bi, MD;  Location: [facility] ENDOSCOPY;  Service: Gastroenterology;  Laterality: N/A;   COLONOSCOPY WITH PROPOFOL  N/A 03/29/2020   Procedure: COLONOSCOPY WITH PROPOFOL ;  Surgeon: Jinny Carmine, MD;  Location: [facility]  ENDOSCOPY;  Service: Endoscopy;  Laterality: N/A;   EXPLORATION POST OPERATIVE OPEN HEART N/A 10/24/2022   Procedure: EXPLORATION POST OPERATIVE OPEN HEART;  Surgeon: Maryjane Mt, MD;  Location: [facility] OR;  Service: Open Heart Surgery;  Laterality: N/A;   MITRAL VALVE REPLACEMENT N/A 10/24/2022   Procedure: MITRAL VALVE (MV) REPLACEMENT USING A 29 MM MEDTRONIC MOSAIC 310;  Surgeon: Maryjane Mt, MD;  Location: [facility] OR;  Service: Open Heart Surgery;  Laterality: N/A;   RIGHT HEART CATH N/A 10/15/2022   Procedure: RIGHT HEART CATH;  Surgeon: Fell Ozell, MD;  Location: [facility] INVASIVE CV LAB;  Service: Cardiovascular;  Laterality: N/A;   RIGHT/LEFT HEART CATH AND CORONARY ANGIOGRAPHY N/A 08/02/2022   Procedure: RIGHT/LEFT HEART CATH AND CORONARY ANGIOGRAPHY;  Surgeon: Cherrie Toribio SAUNDERS, MD;  Location: [facility] INVASIVE CV LAB;  Service: Cardiovascular;  Laterality: N/A;   SMALL BOWEL REPAIR     TEE WITHOUT CARDIOVERSION N/A 08/02/2022   Procedure: TRANSESOPHAGEAL ECHOCARDIOGRAM (TEE);  Surgeon: Cherrie Toribio SAUNDERS, MD;  Location: [facility] ENDOSCOPY;  Service: Cardiovascular;  Laterality: N/A;   TEE WITHOUT CARDIOVERSION N/A 10/04/2022   Procedure: TRANSESOPHAGEAL ECHOCARDIOGRAM (TEE);  Surgeon: Fell Ozell, MD;  Location: [facility] INVASIVE CV LAB;  Service: Cardiovascular;  Laterality: N/A;   TEE WITHOUT CARDIOVERSION N/A 10/24/2022   Procedure: TRANSESOPHAGEAL ECHOCARDIOGRAM (TEE);  Surgeon: Maryjane Mt, MD;  Location: [facility] OR;  Service: Open Heart Surgery;  Laterality: N/A;   TONSILLECTOMY     TRANSCATHETER MITRAL EDGE TO EDGE REPAIR N/A 10/04/2022   Procedure: MITRAL VALVE REPAIR;  Surgeon: Fell Ozell, MD;  Location: [facility] INVASIVE CV LAB;  Service: Cardiovascular;  Laterality: N/A;   UTERINE FIBROID SURGERY      Family Psychiatric History: No additional  Family History:  Family History  Problem Relation Age of Onset   Anxiety disorder Mother    Paranoid behavior Mother    Hypertension Mother     Dementia Mother    High Cholesterol Mother    Diabetes Mother    Hyperlipidemia Mother    Depression Mother    Hypertension Father    High Cholesterol Father    Mood Disorder Sister    Stroke Sister    Anxiety disorder Maternal Aunt    Tuberculosis Paternal Grandfather    Drug abuse Cousin     Social History:  Social History   Socioeconomic History   Marital status: Widowed    Spouse  name: Kenzie Thoreson   Number of children: 0   Years of education: 16   Highest education level: Bachelor's degree (e.g., BA, AB, BS)  Occupational History   Occupation: Retired    Comment: Runner, Broadcasting/film/video  Tobacco Use   Smoking status: Former    Current packs/day: 0.00    Average packs/day: 0.5 packs/day for 25.0 years (12.5 ttl pk-yrs)    Types: Cigarettes    Start date: 04/13/1972    Quit date: 04/13/1997    Years since quitting: 27.5   Smokeless tobacco: Never  Vaping Use   Vaping status: Never Used  Substance and Sexual Activity   Alcohol use: Not Currently    Comment: Occasional use   Drug use: No   Sexual activity: Not Currently  Other Topics Concern   Not on file  Social History Narrative   Lives in [place] with spouse.  No children.   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   Left-handed   Lives in a two story home       Social Drivers of Health   Tobacco Use: Medium Risk (10/20/2024)   Patient History    Smoking Tobacco Use: Former    Smokeless Tobacco Use: Never    Passive Exposure: Not on file  Financial Resource Strain: Low Risk  (10/18/2023)   Received from [facility]   Overall Financial Resource Strain (CARDIA)    Difficulty of Paying Living Expenses: Not hard at all  Recent Concern: Financial Resource Strain - Medium Risk (08/08/2023)   Overall Financial Resource Strain (CARDIA)    Difficulty of Paying Living Expenses: Somewhat hard  Food Insecurity: No Food Insecurity  (06/25/2024)   Epic    Worried About Radiation Protection Practitioner of Food in the Last Year: Never true    Ran Out of Food in the Last Year: Never true  Transportation Needs: No Transportation Needs (06/25/2024)   Epic    Lack of Transportation (Medical): No    Lack of Transportation (Non-Medical): No  Physical Activity: Inactive (08/08/2023)   Exercise Vital Sign    Days of Exercise per Week: 0 days    Minutes of Exercise per Session: 0 min  Stress: Stress Concern Present (08/08/2023)   [employer] of Occupational Health - Occupational Stress Questionnaire    Feeling of Stress : Very much  Social Connections: Moderately Integrated (08/08/2023)   Social Connection and Isolation Panel    Frequency of Communication with Friends and Family: More than three times a week    Frequency of Social Gatherings with Friends and Family: Never    Attends Religious Services: More than 4 times per year    Active Member of Clubs or Organizations: No    Attends Banker Meetings: Never    Marital Status: Married  Depression (PHQ2-9): High Risk (07/27/2024)   Depression (PHQ2-9)    PHQ-2 Score: 13  Alcohol Screen: Low Risk (08/08/2023)   Alcohol Screen    Last Alcohol Screening Score (AUDIT): 0  Housing: Unknown (06/25/2024)   Epic    Unable to Pay for Housing in the Last Year: No    Number of Times Moved in the Last Year: Not on file    Homeless in the Last Year: No  Utilities: Not At Risk (06/25/2024)   Epic    Threatened with loss of utilities: No  Health Literacy: Adequate Health Literacy (08/08/2023)   B1300 Health Literacy  Frequency of need for help with medical instructions: Never    Allergies: Allergies[1]  Metabolic Disorder Labs: Lab Results  Component Value Date   HGBA1C 6.2 07/27/2024   MPG 111.15 05/07/2022   MPG 139.85 03/31/2020   No results found for: PROLACTIN Lab Results  Component Value Date   CHOL 140 04/23/2024   TRIG 55.0 04/23/2024   HDL 45.90 04/23/2024    CHOLHDL 3 04/23/2024   VLDL 11.0 04/23/2024   LDLCALC 83 04/23/2024   LDLCALC 75 03/20/2023   Lab Results  Component Value Date   TSH 0.51 07/27/2024   TSH 0.79 12/28/2022    Therapeutic Level Labs: No results found for: LITHIUM No results found for: VALPROATE No results found for: CBMZ  Current Medications: Current Outpatient Medications  Medication Sig Dispense Refill   aspirin  EC 81 MG tablet Take 1 tablet (81 mg total) by mouth daily. Swallow whole. 30 tablet 12   atorvastatin  (LIPITOR ) 80 MG tablet Take 1 tablet (80 mg total) by mouth daily. 90 tablet 3   carvedilol  (COREG ) 12.5 MG tablet Take 1 tablet (12.5 mg total) by mouth 2 (two) times daily with a meal. Please call [phone] to schedule an appointment with Dr. Ole Holts for future refills. Thank you. 1st attempt. 60 tablet 0   Deutetrabenazine  (AUSTEDO ) 12 MG TABS Take 2 tablets (24 mg total) by mouth daily. 60 tablet 2   donepezil (ARICEPT) 10 MG tablet Take 10 mg by mouth at bedtime.     dorzolamide -timolol  (COSOPT ) 22.3-6.8 MG/ML ophthalmic solution Place 1 drop into both eyes 2 (two) times daily.      fluticasone  (FLONASE ) 50 MCG/ACT nasal spray Place 2 sprays into both nostrils daily. 16 g 6   fluvoxaMINE  (LUVOX ) 100 MG tablet Take 2 tablets (200 mg total) by mouth at bedtime. 60 tablet 2   furosemide  (LASIX ) 40 MG tablet Take 40 mg by mouth as needed.     gabapentin  (NEURONTIN ) 400 MG capsule Take 2 capsules (800 mg total) by mouth 3 (three) times daily. 180 capsule 2   hydrOXYzine  (ATARAX ) 10 MG tablet Take 1 tablet (10 mg total) by mouth 3 (three) times daily as needed. 90 tablet 0   isosorbide  mononitrate (IMDUR ) 30 MG 24 hr tablet Take 1 tablet by mouth once daily 90 tablet 3   QUEtiapine  (SEROQUEL ) 200 MG tablet Take 1 tablet (200 mg total) by mouth at bedtime. 90 tablet 1   valsartan  (DIOVAN ) 80 MG tablet Take 1 tablet by mouth twice daily 180 tablet 0   Vitamin D , Ergocalciferol , (DRISDOL ) 1.25  MG (50000 UNIT) CAPS capsule Take 1 capsule (50,000 Units total) by mouth every 7 (seven) days. 12 capsule 3   No current facility-administered medications for this visit.     Musculoskeletal: Strength & Muscle Tone: within normal limits Gait & Station: normal Patient leans: N/A   Psychiatric Specialty Exam: Review of Systems  Constitutional: Negative.   HENT: Negative.    Eyes: Negative.   Respiratory: Negative.    Cardiovascular: Negative.   Gastrointestinal: Negative.   Endocrine: Negative.   Genitourinary: Negative.   Musculoskeletal: Negative.   Skin: Negative.   Allergic/Immunologic: Negative.   Neurological: Negative.   Hematological: Negative.   Psychiatric/Behavioral:  Positive for dysphoric mood. The patient is nervous/anxious.      Today's Vitals   10/20/24 1421  BP: (!) 159/72  Pulse: 70  Temp: 97.6 F (36.4 C)  TempSrc: Temporal  Weight: 151 lb 6.4 oz (68.7 kg)  Height:  5' 2 (1.575 m)   Body mass index is 27.69 kg/m.   General Appearance: Well Groomed  Eye Contact:  Good  Speech:  Clear and Coherent  Volume:  Normal  Mood:  Anxious and Depressed  Affect:  Appropriate  Thought Process:  Coherent  Orientation:  Full (Time, Place, and Person)  Thought Content: Logical   Suicidal Thoughts:  No  Homicidal Thoughts:  No  Memory:  Immediate;   Good Recent;   Good Remote;   Good  Judgement:  Good  Insight:  Good  Psychomotor Activity:  Normal  Concentration:  Concentration: Good and Attention Span: Good  Recall:  Good  Fund of Knowledge: Good  Language: Good  Akathisia:  No  Handed:  Right  AIMS (if indicated): not done  Assets:  Desire for Improvement Financial Resources/Insurance Housing  ADL's:  Intact  Cognition: WNL  Sleep:  Good   Screenings: GAD-7    Flowsheet Row Office Visit from 07/27/2024 in [facility] Visit from 07/14/2024 in [facility]  Patient Outreach Telephone from 06/25/2024 in [place] [facility] Office Visit from 06/23/2024 in [facility] Office Visit from 06/12/2024 in [facility]  Total GAD-7 Score 15 12 16 19 16    [employer]    Flowsheet Row Office Visit from 07/27/2024 in [facility] Visit from 07/14/2024 in [facility] Patient Outreach Telephone from 07/10/2024 in [place] [facility] DEPART[facility] Patient Outreach Telephone from 06/25/2024 in [place] [facility] DEPART[facility] Office Visit from 06/23/2024 in [facility]  PHQ-2 Total Score 3 3 2 6 6   PHQ-9 Total Score 13 9 6 14 17    Flowsheet Row Office Visit from 07/14/2024 in [facility] Office Visit from 06/23/2024 in [facility] Office Visit from 06/09/2024 in [facility]  C-SSRS RISK CATEGORY No Risk Moderate Risk High Risk     Assessment and Plan:  Assessment - Diagnosis: Mixed obsessional thoughts and acts [F42.2]  2. Generalized anxiety disorder [F41.1]  3. Tardive dyskinesia [G24.01]    - Progress: Patient reports that she had a good trip to [facility] over the [facility] Day holiday and noticed that while she was on the trip she did not have any OCD symptoms.  Patient believes that she has OCD symptoms whenever she is by herself and isolated in her home now that she is without her husband. - Risk Factors: Worsening symptoms, suicide risk  Plan - Medications:  Continue to take Gabapentin  at 800mg  TID for anxiety. Pt reports she increased the dose on her own, and is stating no complications of sedation. Increase 200mg  Fluvox, once a day at bedtime for OCD.  Continue Quetiapine  200mg   once a day at night, prescribed  by another provider patient was reminded of sedation nature of the medication to take her time and waking up as well as monitoring sedation in the mornings and to notify the clinician should it become an issue. Continue Austedo  30mg  once daily, for Tardive Dyskinesia.    - Psychotherapy: Pt with [facility] for therapy.  Patient is also being recommended to be placed on wait list for [facility] who specializes with OCD here at the [facility] office. - Education: Patient has been educated on the evidence-based recommendations for therapy and  pharmacotherapy with OCD symptoms.  As patient is identifying severe symptoms of rituals with OCD patient is recommended to start on medications while waiting for a therapy referral.  Patient has been educated on medications with medication purpose, side effects and adverse reactions.  Patient has also been educated on the need for community support and community involvement during isolation as the patient has a recent widow of 2 months ago.  Patient has been encouraged to seek social groups within her faith-based or community base groups.  Patient has also been educated on importance of following up weekly due to severe symptoms to monitor medication effectiveness as well as possible adjustments. - Follow-Up: Patient to follow-up once Neurology is done with med management.  - Referrals: Patient has been referred to outside therapy as well as being placed on the wait list for Almarie here at [facility] office. - Safety Planning: The patient has been educated, if they should have suicidal thoughts with or without a plan to call 911, or go to the closest emergency department.  Pt verbalized understanding.  Pt denies firearms within the home.  Pt also agrees to call the clinic should they have worsening symptoms before the next appointment.  Patient/Guardian was advised Release of Information must be obtained prior to any record release in order to collaborate their care with  an outside provider. Patient/Guardian was advised if they have not already done so to contact the registration department to sign all necessary forms in order for us  to release information regarding their care.   Consent: Patient/Guardian gives verbal consent for treatment and assignment of benefits for services provided during this visit. Patient/Guardian expressed understanding and agreed to proceed.    Dorn Jama Der, NP 10/20/2024, 2:39 PM     [1]  Allergies Allergen Reactions   Tetracycline Swelling   Meperidine Nausea And Vomiting    (Demerol)Nausea   Demerol [Meperidine Hcl] Nausea Only   Entresto  [Sacubitril -Valsartan ] Other (See Comments)    Shortness of Breath

## 2024-10-21 ENCOUNTER — Telehealth: Payer: Self-pay

## 2024-10-21 NOTE — Telephone Encounter (Signed)
 Prior authorization submitted for patients Austedo  12 mg tablets via CoverMyMeds sent to [facility]   Approved  [id number] Coverage 10-21-24///11-04-25   Patient made aware of approval

## 2024-10-22 ENCOUNTER — Ambulatory Visit: Admitting: Family Medicine

## 2024-11-09 ENCOUNTER — Other Ambulatory Visit: Payer: Self-pay | Admitting: Cardiology

## 2024-11-10 ENCOUNTER — Telehealth: Payer: Self-pay

## 2024-11-10 ENCOUNTER — Other Ambulatory Visit: Payer: Self-pay | Admitting: Psychiatry

## 2024-11-10 MED ORDER — AUSTEDO XR 30 MG PO TB24: 1.0000 | ORAL_TABLET | Freq: Every day | ORAL | 2 refills | Status: AC

## 2024-11-10 NOTE — Telephone Encounter (Signed)
 Pharmacy tech Edd from [facility] calling to request clarification on medication she stated she has a prescription for Deutetrabenazine  (AUSTEDO ) 30 MG TABS XR and can see pt is taking AUSTEDO  XR 12 mg she wants to know if she is suppose to take both? She states they only have a the prescription for AUSTEDO  XR 30mg  Not the 12mg   Pharmacy: number to call back [phone]

## 2024-11-26 ENCOUNTER — Ambulatory Visit

## 2024-11-26 VITALS — BP 120/80 | HR 84 | Temp 98.8°F | Ht 63.0 in | Wt 150.2 lb

## 2024-11-26 DIAGNOSIS — J00 Acute nasopharyngitis [common cold]: Secondary | ICD-10-CM

## 2024-11-26 DIAGNOSIS — E782 Mixed hyperlipidemia: Secondary | ICD-10-CM | POA: Diagnosis not present

## 2024-11-26 DIAGNOSIS — F331 Major depressive disorder, recurrent, moderate: Secondary | ICD-10-CM

## 2024-11-26 DIAGNOSIS — I10 Essential (primary) hypertension: Secondary | ICD-10-CM

## 2024-11-26 DIAGNOSIS — R269 Unspecified abnormalities of gait and mobility: Secondary | ICD-10-CM | POA: Diagnosis not present

## 2024-11-26 DIAGNOSIS — D649 Anemia, unspecified: Secondary | ICD-10-CM

## 2024-11-26 DIAGNOSIS — R7303 Prediabetes: Secondary | ICD-10-CM

## 2024-11-26 DIAGNOSIS — N1831 Chronic kidney disease, stage 3a: Secondary | ICD-10-CM

## 2024-11-26 DIAGNOSIS — N644 Mastodynia: Secondary | ICD-10-CM | POA: Insufficient documentation

## 2024-11-26 DIAGNOSIS — E559 Vitamin D deficiency, unspecified: Secondary | ICD-10-CM

## 2024-11-26 DIAGNOSIS — I480 Paroxysmal atrial fibrillation: Secondary | ICD-10-CM | POA: Diagnosis not present

## 2024-11-26 DIAGNOSIS — Z95811 Presence of heart assist device: Secondary | ICD-10-CM

## 2024-11-26 DIAGNOSIS — F39 Unspecified mood [affective] disorder: Secondary | ICD-10-CM

## 2024-11-26 MED ORDER — IPRATROPIUM BROMIDE 0.03 % NA SOLN: 2.0000 | Freq: Every day | NASAL | 1 refills | Status: AC

## 2024-11-26 NOTE — Assessment & Plan Note (Addendum)
 Generalized bilateral breast pain, improving.  Hesitant for mammogram due to past negative experience. Performed breast examination.  Reassuring.  Recommended diagnostic mammogram and ultrasound b/l breast. Patient declined.

## 2024-11-26 NOTE — Assessment & Plan Note (Signed)
 Chronic, recommend starting physical therapy for balance training, strengthening exercise.  She politely declines this today.  She is seeing neurologist Dr. [facility] for mild cognitive impairment, imbalance.  She plans on discussing this with him during upcoming appointment on 12/14/24 and will let us  know if she changes her mind on PT. suspect scoliosis contributing to balance problem as well.

## 2024-11-26 NOTE — Progress Notes (Signed)
 "  Established Patient Office Visit   Subjective  Patient ID: Melanie Ray, female    DOB: 1950/11/24  Age: 74 y.o. MRN: [id number]  Chief Complaint  Patient presents with   Nasal Congestion   Fatigue   Dizziness   Breast Problem   Gait Problem    Discussed the use of AI scribe software for clinical note transcription with the patient, who gave verbal consent to proceed.  History of Present Illness Melanie Ray is a 74 year old female who presents with chronic runny nose and breast tenderness.  She has experienced a runny nose since June 2024, characterized by clear and runny nasal discharge occurring daily and sometimes nonstop. She previously used Flonase  without relief and has not tried other nasal sprays or over-the-counter allergy medications. No ENT evaluation has been conducted for this issue.  She reports breast tenderness for approximately two months, described as soreness and tenderness, particularly in one area. The pain was more severe about a month ago but has since subsided. She has not had a mammogram in years and expresses fear about undergoing the procedure again. There is no associated rash or discharge.  She mentions a history of leaning to the side when walking, which she has noticed for several months. She has experienced two falls, one of which occurred while carrying her dog and a cup of nuggets, leading to a slip in her bedroom shoes.  Her current medications include Lipitor , aspirin , Coreg  (carvedilol ), deuterium benazine (Austedo ), fluvoxamine , Lovaza, Lasix  as needed, hydroxyzine  as needed, Imdur  (isosorbide  dinitrate), Seroquel , valsartan , and vitamin D  weekly. She was unaware that valsartan  should be taken twice daily and has been taking it once daily.  She has a history of mild anemia and low vitamin D , which are being monitored. She does not use a CPAP machine due to intolerance.  She lives alone following the death of her husband and has  relatives in [place]  and [place] , whom she visits occasionally. No rash, breast discharge, acid reflux, or use of over-the-counter ibuprofen. Reports normal stool.    ROS As per HPI    Objective:     BP 120/80   Pulse 84   Temp 98.8 F (37.1 C) (Oral)   Ht 5' 3 (1.6 m)   Wt 150 lb 3.2 oz (68.1 kg)   SpO2 98%   BMI 26.61 kg/m      11/26/2024    1:15 PM 07/27/2024    1:12 PM 07/14/2024    3:05 PM  Depression screen PHQ 2/9  Decreased Interest  1 1  Down, Depressed, Hopeless 2 2 2   PHQ - 2 Score 2 3 3   Altered sleeping 0 1 0  Tired, decreased energy 2 3 2   Change in appetite 0 0 0  Feeling bad or failure about yourself  1 2 1   Trouble concentrating 0 1 1  Moving slowly or fidgety/restless 1 1 1   Suicidal thoughts 3 2 1   PHQ-9 Score 9 13  9    Difficult doing work/chores Very difficult Very difficult Somewhat difficult     Data saved with a previous flowsheet row definition      11/26/2024    1:15 PM 07/27/2024    1:12 PM 07/14/2024    3:05 PM 06/25/2024    3:52 PM  GAD 7 : Generalized Anxiety Score  Nervous, Anxious, on Edge 3 2  2  3    Control/stop worrying 3 2  2  3    Worry too much -  different things 3 2  2  3    Trouble relaxing 3 2  1  1    Restless 3 2  2  2    Easily annoyed or irritable 1 2  1  1    Afraid - awful might happen 3 3  2  3    Total GAD 7 Score 19 15 12 16   Anxiety Difficulty Very difficult Very difficult Somewhat difficult Very difficult     Data saved with a previous flowsheet row definition      11/26/2024    1:15 PM 07/27/2024    1:12 PM 07/14/2024    3:05 PM  Depression screen PHQ 2/9  Decreased Interest  1 1  Down, Depressed, Hopeless 2 2 2   PHQ - 2 Score 2 3 3   Altered sleeping 0 1 0  Tired, decreased energy 2 3 2   Change in appetite 0 0 0  Feeling bad or failure about yourself  1 2 1   Trouble concentrating 0 1 1  Moving slowly or fidgety/restless 1 1 1   Suicidal thoughts 3 2 1   PHQ-9 Score 9 13  9    Difficult doing work/chores Very  difficult Very difficult Somewhat difficult     Data saved with a previous flowsheet row definition      11/26/2024    1:15 PM 07/27/2024    1:12 PM 07/14/2024    3:05 PM 06/25/2024    3:52 PM  GAD 7 : Generalized Anxiety Score  Nervous, Anxious, on Edge 3 2  2  3    Control/stop worrying 3 2  2  3    Worry too much - different things 3 2  2  3    Trouble relaxing 3 2  1  1    Restless 3 2  2  2    Easily annoyed or irritable 1 2  1  1    Afraid - awful might happen 3 3  2  3    Total GAD 7 Score 19 15 12 16   Anxiety Difficulty Very difficult Very difficult Somewhat difficult Very difficult     Data saved with a previous flowsheet row definition   SDOH Screenings   Food Insecurity: No Food Insecurity (06/25/2024)  Housing: Unknown (06/25/2024)  Transportation Needs: No Transportation Needs (06/25/2024)  Utilities: Not At Risk (06/25/2024)  Alcohol Screen: Low Risk (08/08/2023)  Depression (PHQ2-9): Medium Risk (11/26/2024)  Financial Resource Strain: Low Risk  (10/18/2023)   Received from [facility]  Recent Concern: Financial Resource Strain - Medium Risk (08/08/2023)  Physical Activity: Inactive (08/08/2023)  Social Connections: Moderately Integrated (08/08/2023)  Stress: Stress Concern Present (08/08/2023)  Tobacco Use: Medium Risk (11/26/2024)  Health Literacy: Adequate Health Literacy (08/08/2023)     Physical Exam Constitutional:      General: She is not in acute distress. HENT:     Head: Normocephalic and atraumatic.     Nose: Rhinorrhea (clear) present.     Mouth/Throat:     Mouth: Mucous membranes are moist.  Cardiovascular:     Rate and Rhythm: Normal rate.  Chest:     Chest wall: No mass, lacerations, deformity, swelling, tenderness, crepitus or edema.  Breasts:    Right: Tenderness present. No swelling, bleeding, inverted nipple, mass, nipple discharge or skin change.     Left: Tenderness present. No swelling, bleeding, inverted nipple, mass, nipple  discharge or skin change.     Comments: generalized tenderness on palpation of both breast  Abdominal:     General: Bowel sounds are normal.  Tenderness: There is no guarding.  Musculoskeletal:     Cervical back: Neck supple.     Right lower leg: No edema.     Left lower leg: No edema.     Comments: Back:  asymmetric shoulder height with mild truncal asymmetry   Lymphadenopathy:     Cervical: No cervical adenopathy.     Upper Body:     Right upper body: No supraclavicular, axillary or pectoral adenopathy.     Left upper body: No supraclavicular, axillary or pectoral adenopathy.  Skin:    General: Skin is warm.  Neurological:     Mental Status: She is alert and oriented to person, place, and time.     Gait: Gait abnormal.  Psychiatric:        Behavior: Behavior is cooperative.        Thought Content: Thought content does not include homicidal or suicidal ideation. Thought content does not include homicidal or suicidal plan.        No results found for any visits on 11/26/24.  The ASCVD Risk score (Arnett DK, et al., 2019) failed to calculate for the following reasons:   Risk score cannot be calculated because patient has a medical history suggesting prior/existing ASCVD   * - Cholesterol units were assumed     Assessment & Plan:   Assessment & Plan Acute rhinitis Persistent clear rhinorrhea since June 2024. Previous Flonase  trial ineffective. Prescribed ipratropium nasal spray, 2 puffs in each nostril daily.  Instructed on proper nasal spray technique. If no improvement, refer to ENT. Orders:   ipratropium (ATROVENT ) 0.03 % nasal spray; Place 2 sprays into both nostrils daily.  Breast pain Generalized bilateral breast pain, improving.  Hesitant for mammogram due to past negative experience. Performed breast examination.  Reassuring.  Recommended diagnostic mammogram and ultrasound b/l breast. Patient declined.     Presence of heart assist device  [facility]) Ischemic cardiomyopathy with CRT-D in place.  Continue follow-up with cardiology.    Essential hypertension Diastolic blood pressure slightly elevated on arrival. Repeat blood pressure within normal range. Taking valsartan  once daily instead of twice. Instructed to take valsartan  80 mg twice daily.    Mixed hyperlipidemia Continue atorvastatin  80 mg daily.    Vitamin D  deficiency Currently taking vitamin D  50,000 units weekly.  Will repeat vitamin D  level today. Orders:   Vitamin D  (25 hydroxy)  Normocytic normochromic anemia Known history of normocytic normochromic anemia.  No GI symptoms.  She does not take NSAIDs.  Will check CBC, B12, iron panel today.  If continues to have anemia, or abnormal CBC finding we will refer her to hematology.  Anemia is likely secondary to chronic disease. Orders:   CBC w/Diff   B12   Iron, TIBC and Ferritin Panel  Prediabetes A1c has been stable.  Will repeat her A1c today Orders:   HgB A1c  Mood disorder Generalized anxiety disorder, mixed obsessional thoughts and acts, history of tardive dyskinesia.  No SI, HI today.  Gets sad talking about her husband who passed away.  Continue medication as prescribed by psychiatry.    Gait abnormality Chronic, recommend starting physical therapy for balance training, strengthening exercise.  She politely declines this today.  She is seeing neurologist Dr. Lane at [facility] for mild cognitive impairment, imbalance.  She plans on discussing this with him during upcoming appointment on 12/14/24 and will let us  know if she changes her mind on PT. suspect scoliosis contributing to balance problem as well.  Paroxysmal atrial fibrillation (HCC) Continue follow-up with cardiology.    Moderate episode of recurrent major depressive disorder (HCC) Plan per mood disorder.    Stage 3a chronic kidney disease (HCC) Suspect hypertensive CKD. Urine microalbumin from 07/27/2024 normal.  Renal function  stable.   Continue follow-up with nephrologist Dr. Dennise at [facility].      Return in about 6 months (around 05/26/2025) for Chronic follow up with Dr. Abbey.   Luke Abbey, MD "

## 2024-11-26 NOTE — Assessment & Plan Note (Signed)
"   Continue follow up with cardiology         "

## 2024-11-26 NOTE — Assessment & Plan Note (Addendum)
 Persistent clear rhinorrhea since June 2024. Previous Flonase  trial ineffective. Prescribed ipratropium nasal spray, 2 puffs in each nostril daily.  Instructed on proper nasal spray technique. If no improvement, refer to ENT. Orders:   ipratropium (ATROVENT ) 0.03 % nasal spray; Place 2 sprays into both nostrils daily.

## 2024-11-26 NOTE — Assessment & Plan Note (Deleted)
 Melanie Ray

## 2024-11-26 NOTE — Patient Instructions (Addendum)
-   Trial of Ipratropium nasal spray 2 puffs daily till your next visit. If this does not help with runny nose, recommend ENT evaluation.  - Take Valsartan  80 mg twice a day as prescribed by cardiology.  - I am checking few labs today. If your lab shows anemia or low hemoglobin, I recommend seeing a hematologist.  - Please reach out to us  after you see neurologist to let us  know if you want to proceed with physical therapy.  - If breast pain persists, any new lumps please consider updating mammogram.

## 2024-11-26 NOTE — Assessment & Plan Note (Addendum)
 A1c has been stable.  Will repeat her A1c today Orders:   HgB A1c

## 2024-11-26 NOTE — Assessment & Plan Note (Addendum)
-   Continue atorvastatin  80mg  daily

## 2024-11-26 NOTE — Assessment & Plan Note (Addendum)
 Known history of normocytic normochromic anemia.  No GI symptoms.  She does not take NSAIDs.  Will check CBC, B12, iron panel today.  If continues to have anemia, or abnormal CBC finding we will refer her to hematology.  Anemia is likely secondary to chronic disease. Orders:   CBC w/Diff   B12   Iron, TIBC and Ferritin Panel

## 2024-11-26 NOTE — Assessment & Plan Note (Signed)
 Generalized anxiety disorder, mixed obsessional thoughts and acts, history of tardive dyskinesia.  No SI, HI today.  Gets sad talking about her husband who passed away.  Continue medication as prescribed by psychiatry.

## 2024-11-26 NOTE — Assessment & Plan Note (Addendum)
 Currently taking vitamin D  50,000 units weekly.  Will repeat vitamin D  level today. Orders:   Vitamin D  (25 hydroxy)

## 2024-11-26 NOTE — Assessment & Plan Note (Signed)
 Suspect hypertensive CKD. Urine microalbumin from 07/27/2024 normal.  Renal function stable.   Continue follow-up with nephrologist Dr. Dennise at [facility].

## 2024-11-26 NOTE — Assessment & Plan Note (Addendum)
 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  Continue follow-up with cardiology.

## 2024-11-26 NOTE — Assessment & Plan Note (Signed)
 Plan per mood disorder.

## 2024-11-26 NOTE — Assessment & Plan Note (Addendum)
 Diastolic blood pressure slightly elevated on arrival. Repeat blood pressure within normal range. Taking valsartan  once daily instead of twice. Instructed to take valsartan  80 mg twice daily.

## 2024-11-27 LAB — IRON,TIBC AND FERRITIN PANEL
%SAT: 35 % (ref 16–45)
Ferritin: 58 ng/mL (ref 16–288)
Iron: 117 ug/dL (ref 45–160)
TIBC: 330 ug/dL (ref 250–450)

## 2024-11-27 LAB — CBC WITH DIFFERENTIAL/PLATELET
Basophils Absolute: 0 K/uL (ref 0.0–0.1)
Basophils Relative: 1.1 % (ref 0.0–3.0)
Eosinophils Absolute: 0.1 K/uL (ref 0.0–0.7)
Eosinophils Relative: 1.3 % (ref 0.0–5.0)
HCT: 36 % (ref 36.0–46.0)
Hemoglobin: 12 g/dL (ref 12.0–15.0)
Lymphocytes Relative: 28.2 % (ref 12.0–46.0)
Lymphs Abs: 1.3 K/uL (ref 0.7–4.0)
MCHC: 33.4 g/dL (ref 30.0–36.0)
MCV: 83.3 fl (ref 78.0–100.0)
Monocytes Absolute: 0.5 K/uL (ref 0.1–1.0)
Monocytes Relative: 10.1 % (ref 3.0–12.0)
Neutro Abs: 2.7 K/uL (ref 1.4–7.7)
Neutrophils Relative %: 59.3 % (ref 43.0–77.0)
Platelets: 193 K/uL (ref 150.0–400.0)
RBC: 4.32 Mil/uL (ref 3.87–5.11)
RDW: 14.6 % (ref 11.5–15.5)
WBC: 4.6 K/uL (ref 4.0–10.5)

## 2024-11-27 LAB — VITAMIN B12: Vitamin B-12: >1500 pg/mL — ABNORMAL HIGH (ref 211–911)

## 2024-11-27 LAB — VITAMIN D 25 HYDROXY (VIT D DEFICIENCY, FRACTURES): VITD: 40.9 ng/mL (ref 30.00–100.00)

## 2024-11-27 LAB — HEMOGLOBIN A1C: Hgb A1c MFr Bld: 5.7 % (ref 4.6–6.5)

## 2024-11-30 ENCOUNTER — Ambulatory Visit: Payer: Self-pay

## 2024-12-02 ENCOUNTER — Ambulatory Visit

## 2024-12-02 ENCOUNTER — Telehealth: Payer: Self-pay | Admitting: *Deleted

## 2024-12-02 VITALS — Ht 63.0 in | Wt 145.0 lb

## 2024-12-02 DIAGNOSIS — Z0000 Encounter for general adult medical examination without abnormal findings: Secondary | ICD-10-CM | POA: Diagnosis not present

## 2024-12-02 NOTE — Patient Instructions (Signed)
 Ms. Ballard,  Thank you for taking the time for your Medicare Wellness Visit. I appreciate your continued commitment to your health goals. Please review the care plan we discussed, and feel free to reach out if I can assist you further.  Please note that Annual Wellness Visits do not include a physical exam. Some assessments may be limited, especially if the visit was conducted virtually. If needed, we may recommend an in-person follow-up with your provider.  Ongoing Care Seeing your primary care provider every 3 to 6 months helps us  monitor your health and provide consistent, personalized care.  Remember to call the number provided and schedule your Dexa scan. Make sure that you update your tetanus vaccine at your pharmacy.    Referrals If a referral was made during today's visit and you haven't received any updates within two weeks, please contact the referred provider directly to check on the status.  Recommended Screenings:  Health Maintenance  Topic Date Due   Hepatitis C Screening  Never done   DTaP/Tdap/Td vaccine (1 - Tdap) Never done   Osteoporosis screening with Bone Density Scan  08/25/2021   Colon Cancer Screening  03/29/2025   Kidney health urinalysis for diabetes  01/24/2025   COVID-19 Vaccine (10 - Pfizer risk 2025-26 season) 03/16/2025   Yearly kidney function blood test for diabetes  07/27/2025   Medicare Annual Wellness Visit  12/02/2025   Flu Shot  Completed   Meningitis B Vaccine  Aged Out   Pneumococcal Vaccine for age over 66  Discontinued   Zoster (Shingles) Vaccine  Discontinued       12/02/2024    3:12 PM  Advanced Directives  Does Patient Have a Medical Advance Directive? No  Would patient like information on creating a medical advance directive? --    Vision: Annual vision screenings are recommended for early detection of glaucoma, cataracts, and diabetic retinopathy. These exams can also reveal signs of chronic conditions such as diabetes and high  blood pressure.  Dental: Annual dental screenings help detect early signs of oral cancer, gum disease, and other conditions linked to overall health, including heart disease and diabetes.  Please see the attached documents for additional preventive care recommendations.

## 2024-12-02 NOTE — Telephone Encounter (Signed)
 Noted. This is chronic.  I have reviewed patient's symptoms during office visits in the past.  I am also forwarding this message to psychiatrist NP, Dorn Der.   Luke Shade, MD

## 2024-12-02 NOTE — Progress Notes (Signed)
 "  Chief Complaint  Patient presents with   Medicare Wellness     Subjective:   Melanie Ray is a 74 y.o. female who presents for a Medicare Annual Wellness Visit.  Visit info / Clinical Intake: Medicare Wellness Visit Type:: Subsequent Annual Wellness Visit Persons participating in visit and providing information:: patient Medicare Wellness Visit Mode:: Telephone If telephone:: video declined Since this visit was completed virtually, some vitals may be partially provided or unavailable. Missing vitals are due to the limitations of the virtual format.: Unable to obtain vitals - no equipment If Telephone or Video please confirm:: I connected with patient using audio/video enable telemedicine. I verified patient identity with two identifiers, discussed telehealth limitations, and patient agreed to proceed. Patient Location:: Home Provider Location:: Office.Home Interpreter Needed?: No Pre-visit prep was completed: yes AWV questionnaire completed by patient prior to visit?: no Living arrangements:: (!) lives alone Patient's Overall Health Status Rating: (!) fair Typical amount of pain: none Does pain affect daily life?: no Are you currently prescribed opioids?: no  Dietary Habits and Nutritional Risks How many meals a day?: 2 Eats fruit and vegetables daily?: (!) no Most meals are obtained by: preparing own meals In the last 2 weeks, have you had any of the following?: -- (diarrhea last week from something she ate, okay now) Diabetic:: no  Functional Status Activities of Daily Living (to include ambulation/medication): Independent Ambulation: Independent Medication Administration: Independent Home Management (perform basic housework or laundry): Independent Manage your own finances?: yes Primary transportation is: driving Concerns about vision?: (!) yes (has glaucoma) Concerns about hearing?: (!) yes (consider hearing aids in the future) Uses hearing aids?: no Hear  whispered voice?: -- (televisit)  Fall Screening Falls in the past year?: 1 Number of falls in past year: 0 Was there an injury with Fall?: 0 Fall Risk Category Calculator: 1 Patient Fall Risk Level: Low Fall Risk  Fall Risk Patient at Risk for Falls Due to: History of fall(s); Impaired balance/gait Fall risk Follow up: Falls evaluation completed; Education provided; Falls prevention discussed  Home and Transportation Safety: All rugs have non-skid backing?: yes All stairs or steps have railings?: yes Grab bars in the bathtub or shower?: (!) no Have non-skid surface in bathtub or shower?: yes Good home lighting?: yes Regular seat belt use?: yes Hospital stays in the last year:: no  Cognitive Assessment Difficulty concentrating, remembering, or making decisions? : yes Will 6CIT or Mini Cog be Completed: yes What year is it?: 0 points What month is it?: 0 points Give patient an address phrase to remember (5 components): [address] [place] About what time is it?: 0 points Count backwards from 20 to 1: 0 points Say the months of the year in reverse: 0 points Repeat the address phrase from earlier: 0 points 6 CIT Score: 0 points  Advance Directives (For Healthcare) Does Patient Have a Medical Advance Directive?: No Would patient like information on creating a medical advance directive?: -- (will pick up a set of advanced directives at the office next visit)  Reviewed/Updated  Reviewed/Updated: Reviewed All (Medical, Surgical, Family, Medications, Allergies, Care Teams, Patient Goals)    Allergies (verified) Tetracycline, Meperidine, Demerol [meperidine hcl], and Entresto  [sacubitril -valsartan ]   Current Medications (verified) Outpatient Encounter Medications as of 12/02/2024  Medication Sig   aspirin  EC 81 MG tablet Take 1 tablet (81 mg total) by mouth daily. Swallow whole.   atorvastatin  (LIPITOR ) 80 MG tablet Take 1 tablet (80 mg total) by mouth daily.  carvedilol  (COREG ) 12.5 MG tablet TAKE 1 TABLET BY MOUTH TWICE DAILY WITH MEALS (CALL  [phone]  TO  SCHEDULE  AN  APPOINTMENT  WITH  DR  CINDIE  FOR  FUTURE  REFILLS)   donepezil (ARICEPT) 10 MG tablet Take 10 mg by mouth at bedtime.   dorzolamide -timolol  (COSOPT ) 22.3-6.8 MG/ML ophthalmic solution Place 1 drop into both eyes 2 (two) times daily.    fluvoxaMINE  (LUVOX ) 100 MG tablet Take 2 tablets (200 mg total) by mouth at bedtime.   furosemide  (LASIX ) 40 MG tablet Take 40 mg by mouth as needed.   gabapentin  (NEURONTIN ) 400 MG capsule Take 2 capsules (800 mg total) by mouth 3 (three) times daily.   hydrOXYzine  (ATARAX ) 10 MG tablet Take 1 tablet (10 mg total) by mouth 3 (three) times daily as needed.   ipratropium (ATROVENT ) 0.03 % nasal spray Place 2 sprays into both nostrils daily.   isosorbide  mononitrate (IMDUR ) 30 MG 24 hr tablet Take 1 tablet by mouth once daily   QUEtiapine  (SEROQUEL ) 200 MG tablet Take 1 tablet (200 mg total) by mouth at bedtime.   valsartan  (DIOVAN ) 80 MG tablet Take 1 tablet by mouth twice daily   Vitamin D , Ergocalciferol , (DRISDOL ) 1.25 MG (50000 UNIT) CAPS capsule Take 1 capsule (50,000 Units total) by mouth every 7 (seven) days.   Deutetrabenazine  ER (AUSTEDO  XR) 30 MG TB24 Take 1 tablet by mouth daily. (Patient not taking: Reported on 12/02/2024)   No facility-administered encounter medications on file as of 12/02/2024.    History: Past Medical History:  Diagnosis Date   ABLA (acute blood loss anemia) 10/24/2022   AICD (automatic cardioverter/defibrillator) present    Allergic rhinitis 09/25/2022   Back pain    Benign neoplasm of colon 04/27/2008   CAD (coronary artery disease) 04/11/2016   S/p ant STEMI 5/17: LHC >> LAD proximal 80%, mid 80%, distal 50%, ostial D1 60%; LCx with LPDA lesion 30%; RCA Mild calcification with no significant stenosis in a medium caliber, nondominant RCA; LVEF is estimated at 45% with inferoapical and lateral wall akinesis >>  PCI: PCI: 3.5 x 24 mm Promus DES to prox LAD, 2.5 x 12 mm Promus DES to mid LAD.   Cerebellar stroke 03/30/2020   2021 MRI - Small, old left cerebellar infarct    Chest pain    Chronic HFrEF (heart failure with reduced ejection fraction) 10/25/2022   Chronic systolic CHF (congestive heart failure) 03/21/2016   Echo 01/30/17: Diff HK, mild focal basal septal hypertrophy, EF 30-35, mild AI, MAC, mild MR // Echo 06/08/16: Mild focal basal septal hypertrophy, EF 25-30%, diff HK, ant-septal AK, Gr 1 DD, mild AI, MAC, mild MR, PASP 37 mmHg // Echo 03/18/16: EF 30-35%, ant-septal AK, Gr 1 DD, mild MR, severe LAE.     Constipation    Diarrhea 01/02/2023   Dizziness    Dyspnea    Endotracheal tube present 10/24/2022   Essential hypertension 04/27/2008   Well-controlled today.  She will continue valsartan  80 mg twice a day, Imdur  30 mg daily, Lasix  20 mg every other day, [name]  10 mg daily, and carvedilol  12.5 mg twice daily.   Finger fracture 08/15/2021   She reports this is well-healing.  She will continue to see orthopedics.   Generalized anxiety disorder 04/27/2008   Glaucoma    Gout    History of acute anterior wall MI 03/17/2016   History of colonoscopy 04/27/2008   History of COVID-19 06/27/2022   HLD (hyperlipidemia) 04/27/2008  Check lipid panel.  She will continue Lipitor .   Hot flash, menopausal 02/07/2018   Will check with our clinical pharmacist regarding the black cohosh and primrose in this patient.   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   Iron deficiency anemia, unspecified 04/27/2008   Ischemic cardiomyopathy 10/02/2016   Joint pain    Left bundle branch block 12/03/2018   Chronic.  She will follow with cardiology.   Major depressive disorder 07/28/2015   Chronic ongoing issue.  Has worsened somewhat recently.  Notes  passive SI.  Advised if she develops intent or plan to harm herself she needs to go to the emergency room.  I advised her to contact her psychiatrist to arrange for follow-up and she was willing to do so.  She does have protective factors in place including her husband, dog, and nephew.   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  She will follow with cardiology.  We will send her EKG to her psychiatrist and have CMA contact the psychiatry office to inform them that the EKG was faxed and of the results.   Rash 02/07/2018   Possibly related to folliculitis or some other undetermined cause of her rash.  She will trial over-the-counter antibiotic ointment and if not beneficial she will let us  know and we can refer her to dermatology.   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 Wonda and Dr. 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  She will continue [name]  10 mg daily.   Uterine leiomyoma 04/27/2008   Vitamin D  deficiency 09/15/2010   Past Surgical History:  Procedure Laterality Date   APPENDECTOMY     BIV ICD INSERTION CRT-D N/A 01/02/2022   Procedure: BIV ICD INSERTION CRT-D;  Surgeon: Cindie Ole DASEN, MD;  Location: [facility] INVASIVE CV LAB;  Service: Cardiovascular;  Laterality: N/A;   CARDIAC CATHETERIZATION N/A 03/17/2016   Procedure: Left Heart Cath and Coronary  Angiography;  Surgeon: Ozell Wonda, MD;  Location: [facility] INVASIVE CV LAB;  Service: Cardiovascular;  Laterality: N/A;   CARDIAC CATHETERIZATION N/A 03/17/2016   Procedure: Coronary Stent Intervention;  Surgeon: Ozell Wonda, MD;  Location: [facility] INVASIVE CV LAB;  Service: Cardiovascular;  Laterality: N/A;   COLONOSCOPY WITH PROPOFOL  N/A 08/13/2017   Procedure: COLONOSCOPY WITH PROPOFOL ;  Surgeon: Therisa Bi, MD;  Location: [facility] ENDOSCOPY;  Service: Gastroenterology;  Laterality: N/A;   COLONOSCOPY WITH PROPOFOL  N/A 03/29/2020   Procedure: COLONOSCOPY WITH PROPOFOL ;  Surgeon: Jinny Carmine, MD;  Location: [facility] ENDOSCOPY;  Service: Endoscopy;  Laterality: N/A;   EXPLORATION POST OPERATIVE OPEN HEART N/A 10/24/2022   Procedure: EXPLORATION POST OPERATIVE OPEN HEART;  Surgeon: Maryjane Mt, MD;  Location: [facility] OR;  Service: Open Heart Surgery;  Laterality: N/A;   MITRAL VALVE REPLACEMENT N/A  10/24/2022   Procedure: MITRAL VALVE (MV) REPLACEMENT USING A 29 MM MEDTRONIC MOSAIC 310;  Surgeon: Maryjane Mt, MD;  Location: [facility] OR;  Service: Open Heart Surgery;  Laterality: N/A;   RIGHT HEART CATH N/A 10/15/2022   Procedure: RIGHT HEART CATH;  Surgeon: Wonda Sharper, MD;  Location: [facility] INVASIVE CV LAB;  Service: Cardiovascular;  Laterality: N/A;   RIGHT/LEFT HEART CATH AND CORONARY ANGIOGRAPHY N/A 08/02/2022   Procedure: RIGHT/LEFT HEART CATH AND CORONARY ANGIOGRAPHY;  Surgeon: Cherrie Toribio SAUNDERS, MD;  Location: [facility] INVASIVE CV LAB;  Service: Cardiovascular;  Laterality: N/A;   SMALL BOWEL REPAIR     TEE WITHOUT CARDIOVERSION N/A 08/02/2022   Procedure: TRANSESOPHAGEAL ECHOCARDIOGRAM (TEE);  Surgeon: Cherrie Toribio SAUNDERS, MD;  Location: [facility] ENDOSCOPY;  Service: Cardiovascular;  Laterality: N/A;   TEE WITHOUT CARDIOVERSION N/A 10/04/2022   Procedure: TRANSESOPHAGEAL ECHOCARDIOGRAM (TEE);  Surgeon: Wonda Sharper, MD;  Location: [facility] INVASIVE CV LAB;  Service: Cardiovascular;  Laterality: N/A;   TEE WITHOUT  CARDIOVERSION N/A 10/24/2022   Procedure: TRANSESOPHAGEAL ECHOCARDIOGRAM (TEE);  Surgeon: Maryjane Mt, MD;  Location: [facility] OR;  Service: Open Heart Surgery;  Laterality: N/A;   TONSILLECTOMY     TRANSCATHETER MITRAL EDGE TO EDGE REPAIR N/A 10/04/2022   Procedure: MITRAL VALVE REPAIR;  Surgeon: Wonda Sharper, MD;  Location: [facility] INVASIVE CV LAB;  Service: Cardiovascular;  Laterality: N/A;   UTERINE FIBROID SURGERY     Family History  Problem Relation Age of Onset   Anxiety disorder Mother    Paranoid behavior Mother    Hypertension Mother    Dementia Mother    High Cholesterol Mother    Diabetes Mother    Hyperlipidemia Mother    Depression Mother    Hypertension Father    High Cholesterol Father    Mood Disorder Sister    Stroke Sister    Anxiety disorder Maternal Aunt    Tuberculosis Paternal Grandfather    Drug abuse Cousin    Social History   Occupational History   Occupation: Retired    Comment: Runner, Broadcasting/film/video  Tobacco Use   Smoking status: Former    Current packs/day: 0.00    Average packs/day: 0.5 packs/day for 25.0 years (12.5 ttl pk-yrs)    Types: Cigarettes    Start date: 04/13/1972    Quit date: 04/13/1997    Years since quitting: 27.6   Smokeless tobacco: Never  Vaping Use   Vaping status: Never Used  Substance and Sexual Activity   Alcohol use: Not Currently    Comment: Occasional use   Drug use: No   Sexual activity: Not Currently   Tobacco Counseling Counseling given: Not Answered  SDOH Screenings   Food Insecurity: No Food Insecurity (12/02/2024)  Housing: Low Risk (12/02/2024)  Transportation Needs: No Transportation Needs (12/02/2024)  Utilities: Not At Risk (12/02/2024)  Alcohol Screen: Low Risk (08/08/2023)  Depression (PHQ2-9): High Risk (12/02/2024)  Financial Resource Strain: Low Risk (12/02/2024)  Physical Activity: Inactive (12/02/2024)  Social Connections: Moderately Isolated (12/02/2024)  Stress: Stress Concern Present (12/02/2024)  Tobacco Use:  Medium Risk (12/02/2024)  Health Literacy: Adequate Health Literacy (12/02/2024)   See flowsheets for full screening details  Depression Screen PHQ 2 & 9 Depression Scale- Over the past 2 weeks, how often have you been bothered by any of the following problems? Little interest or pleasure in doing things: 1 Feeling down, depressed, or hopeless (PHQ Adolescent also includes...irritable): 2 PHQ-2 Total Score: 3 Trouble falling or staying asleep, or sleeping too much: 0 Feeling tired  or having little energy: 2 Poor appetite or overeating (PHQ Adolescent also includes...weight loss): 0 Feeling bad about yourself - or that you are a failure or have let yourself or your family down: 2 Trouble concentrating on things, such as reading the newspaper or watching television (PHQ Adolescent also includes...like school work): 1 Moving or speaking so slowly that other people could have noticed. Or the opposite - being so fidgety or restless that you have been moving around a lot more than usual: 1 Thoughts that you would be better off dead, or of hurting yourself in some way: 2 PHQ-9 Total Score: 11 If you checked off any problems, how difficult have these problems made it for you to do your work, take care of things at home, or get along with other people?: Somewhat difficult  Depression Treatment Depression Interventions/Treatment : Currently on Treatment     Goals Addressed             This Visit's Progress    Patient Stated       Wants to improve on her posture             Objective:    Today's Vitals   12/02/24 1505  Weight: 145 lb (65.8 kg)  Height: 5' 3 (1.6 m)   Body mass index is 25.69 kg/m.  Hearing/Vision screen Hearing Screening - Comments:: Some issues, plans on hearing aids in the future Vision Screening - Comments:: Readers, [facility], up to date Immunizations and Health Maintenance Health Maintenance  Topic Date Due   Hepatitis C Screening  Never done    DTaP/Tdap/Td (1 - Tdap) Never done   FOOT EXAM  03/01/2021   Bone Density Scan  08/25/2021   OPHTHALMOLOGY EXAM  09/04/2024   Colonoscopy  03/29/2025   Diabetic kidney evaluation - Urine ACR  01/24/2025   COVID-19 Vaccine (10 - Pfizer risk 2025-26 season) 03/16/2025   HEMOGLOBIN A1C  05/26/2025   Diabetic kidney evaluation - eGFR measurement  07/27/2025   Medicare Annual Wellness (AWV)  12/02/2025   Influenza Vaccine  Completed   Meningococcal B Vaccine  Aged Out   Pneumococcal Vaccine: 50+ Years  Discontinued   Zoster Vaccines- Shingrix  Discontinued        Assessment/Plan:  This is a routine wellness examination for [place].  Patient Care Team: Bair, Kalpana, MD as PCP - General (Family Medicine) Cindie Ole DASEN, MD (Inactive) as PCP - Electrophysiology (Clinical Cardiac Electrophysiology) Wonda Sharper, MD as PCP - Cardiology (Cardiology) Bensimhon, Toribio SAUNDERS, MD as Consulting Physician (Cardiology) Tobie Tonita POUR, DO as Consulting Physician (Neurology) Jacobo Evalene PARAS, MD as Consulting Physician (Hematology and Oncology) Ermalinda Lenn HERO, LCSW as Social Worker [place], [place], [place] as [facility] Care Management Riddle, Suzann, NP as Nurse Practitioner (Clinical Cardiac Electrophysiology) Lelon Glendia DASEN DEVONNA as Physician Assistant (Cardiology) [place], [facility])  I have personally reviewed and noted the following in the patients chart:   Medical and social history Use of alcohol, tobacco or illicit drugs  Current medications and supplements including opioid prescriptions. Functional ability and status Nutritional status Physical activity Advanced directives List of other physicians Hospitalizations, surgeries, and ER visits in previous 12 months Vitals Screenings to include cognitive, depression, and falls Referrals and appointments  No orders of the defined types were placed in this encounter.  In addition, I have reviewed and discussed with  patient certain preventive protocols, quality metrics, and best practice recommendations. A written personalized care plan for preventive services  as well as general preventive health recommendations were provided to patient.   Angeline Fredericks, LPN   8/71/7973   Return in 1 year (on 12/02/2025).  After Visit Summary: (MyChart) Due to this being a telephonic visit, the after visit summary with patients personalized plan was offered to patient via MyChart   Nurse Notes: Patient reminded to call and schedule Dexa order was placed 07/27/25 telephone number provided. Discussed the need to update tetanus vaccine. Patient will consider Hepatitis C screening. Phone note sent to PCP regarding depression.  "

## 2024-12-02 NOTE — Telephone Encounter (Signed)
 Performed AWV by telephone  Please see depression screening. Patient stated that she is depressed and has thought about killing herself and ways to do it. Patient stated that she has not carried this out because she has to take care of her dog.  Patient has a therapist and she stated that he is aware of her thoughts. Patient sees Dorn Der DNP-C which is in [place]. Patient stated that she has a virtual visit scheduled with him tomorrow and will let him know that her medications are not helping her depression. Patient stated that she has thoughts of committing suicide but would never do it because her dog depends on her.

## 2024-12-03 ENCOUNTER — Telehealth: Payer: Self-pay

## 2024-12-03 DIAGNOSIS — R269 Unspecified abnormalities of gait and mobility: Secondary | ICD-10-CM

## 2024-12-03 NOTE — Telephone Encounter (Signed)
 Copied from CRM #[id number]. Topic: Referral - Request for Referral >> Dec 03, 2024  3:27 PM Alfonso ORN wrote: Did the patient discuss referral with their provider in the last year? Yes   Appointment offered? Yes  Type of order/referral and detailed reason for visit: Physical Therapy   Preference of office, provider, location: [place] area  If referral order, have you been seen by this specialty before? Yes A long time ago   Can we respond through MyChart? No

## 2024-12-04 NOTE — Addendum Note (Signed)
 Addended by: Yesly Gerety on: 12/04/2024 03:51 PM   Modules accepted: Orders

## 2024-12-04 NOTE — Telephone Encounter (Signed)
 I recommended and referral physical therapy during her office visit with me on 11/26/24 to help with balance and strength training. Patient declined the referral during the visit. Please call her to confirm she is okay with physical therapy referral. When she gives conformation on PT please sign pended order for PT.    ---  For documentation:  Gait abnormality/imbalance, h/o stroke chronic dizziness/room spinning sensation, not using assistive device for ambulation.  PT referral to help with gait, balance ---- Thank you,  Luke Shade, MD

## 2024-12-04 NOTE — Telephone Encounter (Signed)
 Spoke with pt and she agreed to the PT referral. Referral has been signed.

## 2024-12-04 NOTE — Addendum Note (Signed)
 Addended by: Sharlee Rufino on: 12/04/2024 07:44 AM   Modules accepted: Orders

## 2024-12-16 ENCOUNTER — Ambulatory Visit: Admitting: Physical Therapy

## 2024-12-17 ENCOUNTER — Ambulatory Visit: Admitting: Nurse Practitioner

## 2024-12-22 ENCOUNTER — Ambulatory Visit: Admitting: Professional Counselor

## 2024-12-22 ENCOUNTER — Ambulatory Visit

## 2025-01-04 ENCOUNTER — Ambulatory Visit

## 2025-01-06 ENCOUNTER — Ambulatory Visit

## 2025-01-11 ENCOUNTER — Ambulatory Visit

## 2025-01-13 ENCOUNTER — Ambulatory Visit

## 2025-01-18 ENCOUNTER — Ambulatory Visit

## 2025-01-20 ENCOUNTER — Ambulatory Visit

## 2025-01-25 ENCOUNTER — Ambulatory Visit

## 2025-01-27 ENCOUNTER — Ambulatory Visit

## 2025-02-01 ENCOUNTER — Ambulatory Visit

## 2025-02-03 ENCOUNTER — Ambulatory Visit

## 2025-02-08 ENCOUNTER — Ambulatory Visit

## 2025-02-10 ENCOUNTER — Ambulatory Visit

## 2025-02-15 ENCOUNTER — Ambulatory Visit

## 2025-02-17 ENCOUNTER — Ambulatory Visit

## 2025-02-22 ENCOUNTER — Ambulatory Visit

## 2025-02-24 ENCOUNTER — Ambulatory Visit

## 2025-03-01 ENCOUNTER — Ambulatory Visit

## 2025-03-03 ENCOUNTER — Ambulatory Visit

## 2025-03-08 ENCOUNTER — Ambulatory Visit

## 2025-03-10 ENCOUNTER — Ambulatory Visit

## 2025-03-15 ENCOUNTER — Ambulatory Visit

## 2025-03-17 ENCOUNTER — Ambulatory Visit

## 2025-03-22 ENCOUNTER — Ambulatory Visit

## 2025-03-24 ENCOUNTER — Ambulatory Visit

## 2025-05-26 ENCOUNTER — Ambulatory Visit

## 2025-12-07 ENCOUNTER — Ambulatory Visit
